# Patient Record
Sex: Female | Born: 1935 | Race: White | Hispanic: No | Marital: Married | Smoking: Former smoker
Health system: Southern US, Community
[De-identification: ages and names within clinical notes are randomized; demographics above are authoritative.]

## PROBLEM LIST (undated history)

## (undated) DIAGNOSIS — I10 Essential (primary) hypertension: Secondary | ICD-10-CM

## (undated) DIAGNOSIS — R2689 Other abnormalities of gait and mobility: Secondary | ICD-10-CM

## (undated) DIAGNOSIS — R0600 Dyspnea, unspecified: Secondary | ICD-10-CM

## (undated) DIAGNOSIS — K649 Unspecified hemorrhoids: Secondary | ICD-10-CM

## (undated) DIAGNOSIS — IMO0001 Reserved for inherently not codable concepts without codable children: Secondary | ICD-10-CM

## (undated) DIAGNOSIS — Z87442 Personal history of urinary calculi: Secondary | ICD-10-CM

## (undated) DIAGNOSIS — M1990 Unspecified osteoarthritis, unspecified site: Secondary | ICD-10-CM

## (undated) DIAGNOSIS — R112 Nausea with vomiting, unspecified: Secondary | ICD-10-CM

## (undated) DIAGNOSIS — C801 Malignant (primary) neoplasm, unspecified: Secondary | ICD-10-CM

## (undated) DIAGNOSIS — I509 Heart failure, unspecified: Secondary | ICD-10-CM

## (undated) DIAGNOSIS — K219 Gastro-esophageal reflux disease without esophagitis: Secondary | ICD-10-CM

## (undated) DIAGNOSIS — R0609 Other forms of dyspnea: Secondary | ICD-10-CM

## (undated) DIAGNOSIS — M810 Age-related osteoporosis without current pathological fracture: Secondary | ICD-10-CM

## (undated) DIAGNOSIS — D649 Anemia, unspecified: Secondary | ICD-10-CM

## (undated) DIAGNOSIS — Z5181 Encounter for therapeutic drug level monitoring: Secondary | ICD-10-CM

## (undated) DIAGNOSIS — Z973 Presence of spectacles and contact lenses: Secondary | ICD-10-CM

## (undated) DIAGNOSIS — Z85118 Personal history of other malignant neoplasm of bronchus and lung: Secondary | ICD-10-CM

## (undated) DIAGNOSIS — Z9889 Other specified postprocedural states: Secondary | ICD-10-CM

## (undated) DIAGNOSIS — Z86711 Personal history of pulmonary embolism: Secondary | ICD-10-CM

## (undated) DIAGNOSIS — R21 Rash and other nonspecific skin eruption: Secondary | ICD-10-CM

## (undated) DIAGNOSIS — Z8719 Personal history of other diseases of the digestive system: Secondary | ICD-10-CM

## (undated) DIAGNOSIS — J449 Chronic obstructive pulmonary disease, unspecified: Secondary | ICD-10-CM

## (undated) DIAGNOSIS — M25531 Pain in right wrist: Secondary | ICD-10-CM

## (undated) DIAGNOSIS — F329 Major depressive disorder, single episode, unspecified: Secondary | ICD-10-CM

## (undated) HISTORY — DX: Major depressive disorder, single episode, unspecified: F32.9

## (undated) HISTORY — DX: Other abnormalities of gait and mobility: R26.89

## (undated) HISTORY — DX: Encounter for therapeutic drug level monitoring: Z51.81

## (undated) HISTORY — DX: Malignant (primary) neoplasm, unspecified: C80.1

## (undated) HISTORY — PX: TONSILLECTOMY: SUR1361

## (undated) HISTORY — DX: Unspecified hemorrhoids: K64.9

## (undated) HISTORY — PX: THUMB ARTHROSCOPY: SHX2509

## (undated) HISTORY — DX: Essential (primary) hypertension: I10

## (undated) HISTORY — PX: EYE SURGERY: SHX253

---

## 1980-12-24 HISTORY — PX: TOTAL ABDOMINAL HYSTERECTOMY W/ BILATERAL SALPINGOOPHORECTOMY: SHX83

## 1997-12-24 HISTORY — PX: KNEE ARTHROSCOPY: SUR90

## 1998-05-24 ENCOUNTER — Inpatient Hospital Stay (HOSPITAL_COMMUNITY): Admission: EM | Admit: 1998-05-24 | Discharge: 1998-05-27 | Payer: Self-pay | Admitting: Internal Medicine

## 1999-01-03 ENCOUNTER — Other Ambulatory Visit: Admission: RE | Admit: 1999-01-03 | Discharge: 1999-01-03 | Payer: Self-pay | Admitting: Obstetrics & Gynecology

## 1999-02-22 ENCOUNTER — Encounter: Payer: Self-pay | Admitting: Obstetrics & Gynecology

## 1999-02-22 ENCOUNTER — Ambulatory Visit (HOSPITAL_COMMUNITY): Admission: RE | Admit: 1999-02-22 | Discharge: 1999-02-22 | Payer: Self-pay | Admitting: Obstetrics & Gynecology

## 1999-02-22 HISTORY — PX: OTHER SURGICAL HISTORY: SHX169

## 1999-02-27 ENCOUNTER — Ambulatory Visit (HOSPITAL_BASED_OUTPATIENT_CLINIC_OR_DEPARTMENT_OTHER): Admission: RE | Admit: 1999-02-27 | Discharge: 1999-02-27 | Payer: Self-pay | Admitting: General Surgery

## 1999-03-07 ENCOUNTER — Ambulatory Visit (HOSPITAL_COMMUNITY): Admission: RE | Admit: 1999-03-07 | Discharge: 1999-03-07 | Payer: Self-pay | Admitting: Orthopedic Surgery

## 2000-02-05 ENCOUNTER — Encounter: Admission: RE | Admit: 2000-02-05 | Discharge: 2000-02-05 | Payer: Self-pay | Admitting: Internal Medicine

## 2000-02-05 ENCOUNTER — Encounter: Payer: Self-pay | Admitting: Internal Medicine

## 2001-02-19 ENCOUNTER — Encounter: Payer: Self-pay | Admitting: Internal Medicine

## 2001-02-19 ENCOUNTER — Encounter: Admission: RE | Admit: 2001-02-19 | Discharge: 2001-02-19 | Payer: Self-pay | Admitting: Internal Medicine

## 2002-02-26 ENCOUNTER — Encounter: Admission: RE | Admit: 2002-02-26 | Discharge: 2002-02-26 | Payer: Self-pay | Admitting: Internal Medicine

## 2002-02-26 ENCOUNTER — Encounter: Payer: Self-pay | Admitting: Internal Medicine

## 2002-12-24 HISTORY — PX: BREAST EXCISIONAL BIOPSY: SUR124

## 2003-03-01 ENCOUNTER — Encounter: Payer: Self-pay | Admitting: Internal Medicine

## 2003-03-01 ENCOUNTER — Encounter: Admission: RE | Admit: 2003-03-01 | Discharge: 2003-03-01 | Payer: Self-pay | Admitting: Internal Medicine

## 2004-03-09 ENCOUNTER — Encounter: Admission: RE | Admit: 2004-03-09 | Discharge: 2004-03-09 | Payer: Self-pay | Admitting: Internal Medicine

## 2005-03-15 ENCOUNTER — Encounter: Admission: RE | Admit: 2005-03-15 | Discharge: 2005-03-15 | Payer: Self-pay | Admitting: Internal Medicine

## 2006-04-16 ENCOUNTER — Encounter: Admission: RE | Admit: 2006-04-16 | Discharge: 2006-04-16 | Payer: Self-pay | Admitting: Internal Medicine

## 2007-04-21 ENCOUNTER — Encounter: Admission: RE | Admit: 2007-04-21 | Discharge: 2007-04-21 | Payer: Self-pay | Admitting: Family Medicine

## 2007-11-25 ENCOUNTER — Emergency Department (HOSPITAL_COMMUNITY): Admission: EM | Admit: 2007-11-25 | Discharge: 2007-11-25 | Payer: Self-pay | Admitting: Emergency Medicine

## 2008-04-22 ENCOUNTER — Encounter: Admission: RE | Admit: 2008-04-22 | Discharge: 2008-04-22 | Payer: Self-pay | Admitting: Family Medicine

## 2008-12-22 ENCOUNTER — Inpatient Hospital Stay (HOSPITAL_COMMUNITY): Admission: AD | Admit: 2008-12-22 | Discharge: 2008-12-23 | Payer: Self-pay | Admitting: Internal Medicine

## 2008-12-23 ENCOUNTER — Encounter (INDEPENDENT_AMBULATORY_CARE_PROVIDER_SITE_OTHER): Payer: Self-pay | Admitting: Internal Medicine

## 2008-12-23 HISTORY — PX: TRANSTHORACIC ECHOCARDIOGRAM: SHX275

## 2009-01-07 ENCOUNTER — Encounter: Admission: RE | Admit: 2009-01-07 | Discharge: 2009-01-07 | Payer: Self-pay | Admitting: Family Medicine

## 2009-01-13 ENCOUNTER — Ambulatory Visit (HOSPITAL_COMMUNITY): Admission: RE | Admit: 2009-01-13 | Discharge: 2009-01-13 | Payer: Self-pay | Admitting: Family Medicine

## 2009-01-20 ENCOUNTER — Ambulatory Visit: Payer: Self-pay | Admitting: Thoracic Surgery

## 2009-01-20 ENCOUNTER — Encounter: Admission: RE | Admit: 2009-01-20 | Discharge: 2009-01-20 | Payer: Self-pay | Admitting: Thoracic Surgery

## 2009-01-25 ENCOUNTER — Encounter: Admission: RE | Admit: 2009-01-25 | Discharge: 2009-01-25 | Payer: Self-pay | Admitting: Thoracic Surgery

## 2009-01-26 ENCOUNTER — Ambulatory Visit: Payer: Self-pay | Admitting: Internal Medicine

## 2009-01-26 ENCOUNTER — Encounter: Payer: Self-pay | Admitting: Thoracic Surgery

## 2009-01-26 ENCOUNTER — Ambulatory Visit (HOSPITAL_COMMUNITY): Admission: RE | Admit: 2009-01-26 | Discharge: 2009-01-26 | Payer: Self-pay | Admitting: Thoracic Surgery

## 2009-01-26 ENCOUNTER — Ambulatory Visit: Payer: Self-pay | Admitting: Thoracic Surgery

## 2009-01-26 HISTORY — PX: THORACOSCOPY: SUR1347

## 2009-01-27 ENCOUNTER — Ambulatory Visit: Payer: Self-pay | Admitting: Thoracic Surgery

## 2009-01-27 ENCOUNTER — Ambulatory Visit: Payer: Self-pay | Admitting: Internal Medicine

## 2009-01-31 ENCOUNTER — Encounter: Admission: RE | Admit: 2009-01-31 | Discharge: 2009-01-31 | Payer: Self-pay | Admitting: Thoracic Surgery

## 2009-01-31 HISTORY — PX: BREAST BIOPSY: SHX20

## 2009-02-01 ENCOUNTER — Ambulatory Visit: Admission: RE | Admit: 2009-02-01 | Discharge: 2009-03-30 | Payer: Self-pay | Admitting: Radiation Oncology

## 2009-02-07 LAB — COMPREHENSIVE METABOLIC PANEL
BUN: 14 mg/dL (ref 6–23)
CO2: 22 mEq/L (ref 19–32)
Calcium: 8.9 mg/dL (ref 8.4–10.5)
Chloride: 107 mEq/L (ref 96–112)
Creatinine, Ser: 0.62 mg/dL (ref 0.40–1.20)
Glucose, Bld: 178 mg/dL — ABNORMAL HIGH (ref 70–99)
Total Bilirubin: 0.4 mg/dL (ref 0.3–1.2)

## 2009-02-07 LAB — CBC WITH DIFFERENTIAL/PLATELET
Basophils Absolute: 0 10*3/uL (ref 0.0–0.1)
Eosinophils Absolute: 0.2 10*3/uL (ref 0.0–0.5)
HCT: 37.1 % (ref 34.8–46.6)
MCH: 30.1 pg (ref 26.0–34.0)
MCHC: 34.5 g/dL (ref 32.0–36.0)
MCV: 87.4 fL (ref 81.0–101.0)
MONO#: 0.4 10*3/uL (ref 0.1–0.9)
Platelets: 339 10*3/uL (ref 145–400)
RBC: 4.24 10*6/uL (ref 3.70–5.32)
lymph#: 1.1 10*3/uL (ref 0.9–3.3)

## 2009-02-14 LAB — COMPREHENSIVE METABOLIC PANEL
CO2: 21 mEq/L (ref 19–32)
Calcium: 9 mg/dL (ref 8.4–10.5)
Chloride: 107 mEq/L (ref 96–112)
Creatinine, Ser: 0.68 mg/dL (ref 0.40–1.20)
Glucose, Bld: 86 mg/dL (ref 70–99)
Sodium: 139 mEq/L (ref 135–145)
Total Bilirubin: 0.4 mg/dL (ref 0.3–1.2)
Total Protein: 6.2 g/dL (ref 6.0–8.3)

## 2009-02-14 LAB — CBC WITH DIFFERENTIAL/PLATELET
Basophils Absolute: 0 10*3/uL (ref 0.0–0.1)
Eosinophils Absolute: 0.2 10*3/uL (ref 0.0–0.5)
HGB: 12.4 g/dL (ref 11.6–15.9)
LYMPH%: 15.7 % (ref 14.0–49.7)
MCV: 85 fL (ref 79.5–101.0)
MONO#: 0.4 10*3/uL (ref 0.1–0.9)
MONO%: 8.4 % (ref 0.0–14.0)
NEUT#: 3.4 10*3/uL (ref 1.5–6.5)
Platelets: 282 10*3/uL (ref 145–400)

## 2009-02-16 ENCOUNTER — Ambulatory Visit: Payer: Self-pay | Admitting: Thoracic Surgery

## 2009-02-21 LAB — COMPREHENSIVE METABOLIC PANEL
ALT: 15 U/L (ref 0–35)
Alkaline Phosphatase: 58 U/L (ref 39–117)
CO2: 19 mEq/L (ref 19–32)
Creatinine, Ser: 0.61 mg/dL (ref 0.40–1.20)
Total Bilirubin: 0.5 mg/dL (ref 0.3–1.2)

## 2009-02-21 LAB — CBC WITH DIFFERENTIAL/PLATELET
BASO%: 0.3 % (ref 0.0–2.0)
EOS%: 3.8 % (ref 0.0–7.0)
Eosinophils Absolute: 0.2 10*3/uL (ref 0.0–0.5)
LYMPH%: 13.6 % — ABNORMAL LOW (ref 14.0–49.7)
MCH: 28.9 pg (ref 25.1–34.0)
MCHC: 34.1 g/dL (ref 31.5–36.0)
MCV: 84.7 fL (ref 79.5–101.0)
MONO%: 7.6 % (ref 0.0–14.0)
Platelets: 196 10*3/uL (ref 145–400)
RBC: 4.19 10*6/uL (ref 3.70–5.45)
nRBC: 0 % (ref 0–0)

## 2009-02-28 LAB — COMPREHENSIVE METABOLIC PANEL
Albumin: 3.4 g/dL — ABNORMAL LOW (ref 3.5–5.2)
CO2: 18 mEq/L — ABNORMAL LOW (ref 19–32)
Glucose, Bld: 182 mg/dL — ABNORMAL HIGH (ref 70–99)
Potassium: 3.7 mEq/L (ref 3.5–5.3)
Sodium: 138 mEq/L (ref 135–145)
Total Bilirubin: 0.4 mg/dL (ref 0.3–1.2)
Total Protein: 5 g/dL — ABNORMAL LOW (ref 6.0–8.3)

## 2009-02-28 LAB — CBC WITH DIFFERENTIAL/PLATELET
Eosinophils Absolute: 0.1 10*3/uL (ref 0.0–0.5)
MONO#: 0.4 10*3/uL (ref 0.1–0.9)
NEUT#: 2.4 10*3/uL (ref 1.5–6.5)
RBC: 4.11 10*6/uL (ref 3.70–5.45)
RDW: 14.1 % (ref 11.2–14.5)
WBC: 3.2 10*3/uL — ABNORMAL LOW (ref 3.9–10.3)
nRBC: 0 % (ref 0–0)

## 2009-03-07 LAB — CBC WITH DIFFERENTIAL/PLATELET
BASO%: 0.3 % (ref 0.0–2.0)
Basophils Absolute: 0 10*3/uL (ref 0.0–0.1)
HCT: 33.5 % — ABNORMAL LOW (ref 34.8–46.6)
HGB: 11.5 g/dL — ABNORMAL LOW (ref 11.6–15.9)
MONO#: 0.3 10*3/uL (ref 0.1–0.9)
NEUT%: 82.6 % — ABNORMAL HIGH (ref 38.4–76.8)
WBC: 3.8 10*3/uL — ABNORMAL LOW (ref 3.9–10.3)
lymph#: 0.3 10*3/uL — ABNORMAL LOW (ref 0.9–3.3)

## 2009-03-07 LAB — COMPREHENSIVE METABOLIC PANEL
AST: 14 U/L (ref 0–37)
Albumin: 4 g/dL (ref 3.5–5.2)
BUN: 18 mg/dL (ref 6–23)
Calcium: 8.9 mg/dL (ref 8.4–10.5)
Chloride: 108 mEq/L (ref 96–112)
Glucose, Bld: 89 mg/dL (ref 70–99)
Potassium: 4.2 mEq/L (ref 3.5–5.3)
Total Protein: 6.1 g/dL (ref 6.0–8.3)

## 2009-03-14 ENCOUNTER — Ambulatory Visit: Payer: Self-pay | Admitting: Internal Medicine

## 2009-03-14 LAB — CBC WITH DIFFERENTIAL/PLATELET
Eosinophils Absolute: 0.1 10*3/uL (ref 0.0–0.5)
MONO#: 0.3 10*3/uL (ref 0.1–0.9)
NEUT#: 2.4 10*3/uL (ref 1.5–6.5)
RBC: 3.76 10*6/uL (ref 3.70–5.45)
RDW: 14.9 % — ABNORMAL HIGH (ref 11.2–14.5)
WBC: 3 10*3/uL — ABNORMAL LOW (ref 3.9–10.3)
nRBC: 0 % (ref 0–0)

## 2009-03-14 LAB — COMPREHENSIVE METABOLIC PANEL
ALT: 13 U/L (ref 0–35)
AST: 16 U/L (ref 0–37)
CO2: 22 mEq/L (ref 19–32)
Calcium: 9 mg/dL (ref 8.4–10.5)
Chloride: 108 mEq/L (ref 96–112)
Sodium: 139 mEq/L (ref 135–145)
Total Bilirubin: 0.5 mg/dL (ref 0.3–1.2)
Total Protein: 6 g/dL (ref 6.0–8.3)

## 2009-03-21 LAB — COMPREHENSIVE METABOLIC PANEL
Alkaline Phosphatase: 57 U/L (ref 39–117)
Creatinine, Ser: 0.64 mg/dL (ref 0.40–1.20)
Glucose, Bld: 93 mg/dL (ref 70–99)
Sodium: 140 mEq/L (ref 135–145)
Total Bilirubin: 0.5 mg/dL (ref 0.3–1.2)
Total Protein: 6.3 g/dL (ref 6.0–8.3)

## 2009-03-21 LAB — CBC WITH DIFFERENTIAL/PLATELET
Basophils Absolute: 0 10*3/uL (ref 0.0–0.1)
Eosinophils Absolute: 0 10*3/uL (ref 0.0–0.5)
HCT: 32.9 % — ABNORMAL LOW (ref 34.8–46.6)
LYMPH%: 6.9 % — ABNORMAL LOW (ref 14.0–49.7)
MCV: 85.5 fL (ref 79.5–101.0)
MONO%: 13.8 % (ref 0.0–14.0)
NEUT#: 1.9 10*3/uL (ref 1.5–6.5)
NEUT%: 77.7 % — ABNORMAL HIGH (ref 38.4–76.8)
Platelets: 183 10*3/uL (ref 145–400)
RBC: 3.85 10*6/uL (ref 3.70–5.45)

## 2009-04-07 ENCOUNTER — Encounter: Admission: RE | Admit: 2009-04-07 | Discharge: 2009-06-15 | Payer: Self-pay | Admitting: Family Medicine

## 2009-04-18 ENCOUNTER — Ambulatory Visit (HOSPITAL_COMMUNITY): Admission: RE | Admit: 2009-04-18 | Discharge: 2009-04-18 | Payer: Self-pay | Admitting: Internal Medicine

## 2009-04-21 LAB — CBC WITH DIFFERENTIAL/PLATELET
Basophils Absolute: 0 10*3/uL (ref 0.0–0.1)
Eosinophils Absolute: 0.1 10*3/uL (ref 0.0–0.5)
HGB: 11.8 g/dL (ref 11.6–15.9)
MCV: 93.5 fL (ref 79.5–101.0)
MONO#: 0.5 10*3/uL (ref 0.1–0.9)
MONO%: 12.6 % (ref 0.0–14.0)
NEUT#: 2.8 10*3/uL (ref 1.5–6.5)
Platelets: 236 10*3/uL (ref 145–400)
RBC: 3.64 10*6/uL — ABNORMAL LOW (ref 3.70–5.45)
RDW: 21 % — ABNORMAL HIGH (ref 11.2–14.5)
WBC: 3.8 10*3/uL — ABNORMAL LOW (ref 3.9–10.3)

## 2009-04-21 LAB — COMPREHENSIVE METABOLIC PANEL
Albumin: 4 g/dL (ref 3.5–5.2)
BUN: 17 mg/dL (ref 6–23)
CO2: 24 mEq/L (ref 19–32)
Calcium: 9.1 mg/dL (ref 8.4–10.5)
Glucose, Bld: 87 mg/dL (ref 70–99)
Potassium: 4.4 mEq/L (ref 3.5–5.3)
Sodium: 141 mEq/L (ref 135–145)
Total Protein: 6 g/dL (ref 6.0–8.3)

## 2009-04-25 ENCOUNTER — Encounter: Admission: RE | Admit: 2009-04-25 | Discharge: 2009-04-25 | Payer: Self-pay | Admitting: Family Medicine

## 2009-05-03 ENCOUNTER — Ambulatory Visit: Payer: Self-pay | Admitting: Internal Medicine

## 2009-05-12 LAB — COMPREHENSIVE METABOLIC PANEL
Alkaline Phosphatase: 64 U/L (ref 39–117)
BUN: 17 mg/dL (ref 6–23)
CO2: 23 mEq/L (ref 19–32)
Glucose, Bld: 115 mg/dL — ABNORMAL HIGH (ref 70–99)
Total Bilirubin: 0.8 mg/dL (ref 0.3–1.2)
Total Protein: 5.9 g/dL — ABNORMAL LOW (ref 6.0–8.3)

## 2009-05-12 LAB — CBC WITH DIFFERENTIAL/PLATELET
Basophils Absolute: 0 10*3/uL (ref 0.0–0.1)
Eosinophils Absolute: 0.2 10*3/uL (ref 0.0–0.5)
HCT: 34.6 % — ABNORMAL LOW (ref 34.8–46.6)
HGB: 12 g/dL (ref 11.6–15.9)
LYMPH%: 10.5 % — ABNORMAL LOW (ref 14.0–49.7)
MCV: 95.6 fL (ref 79.5–101.0)
MONO#: 0.5 10*3/uL (ref 0.1–0.9)
MONO%: 9.5 % (ref 0.0–14.0)
NEUT#: 3.9 10*3/uL (ref 1.5–6.5)
NEUT%: 74.9 % (ref 38.4–76.8)
Platelets: 244 10*3/uL (ref 145–400)
WBC: 5.2 10*3/uL (ref 3.9–10.3)

## 2009-06-02 LAB — CBC WITH DIFFERENTIAL/PLATELET
Eosinophils Absolute: 0.4 10*3/uL (ref 0.0–0.5)
HCT: 38.1 % (ref 34.8–46.6)
LYMPH%: 12.1 % — ABNORMAL LOW (ref 14.0–49.7)
MCV: 95.7 fL (ref 79.5–101.0)
MONO%: 8.8 % (ref 0.0–14.0)
NEUT#: 3.9 10*3/uL (ref 1.5–6.5)
NEUT%: 72.2 % (ref 38.4–76.8)
Platelets: 245 10*3/uL (ref 145–400)
RBC: 3.98 10*6/uL (ref 3.70–5.45)

## 2009-06-02 LAB — COMPREHENSIVE METABOLIC PANEL
Alkaline Phosphatase: 65 U/L (ref 39–117)
BUN: 17 mg/dL (ref 6–23)
Creatinine, Ser: 0.75 mg/dL (ref 0.40–1.20)
Glucose, Bld: 88 mg/dL (ref 70–99)
Sodium: 141 mEq/L (ref 135–145)
Total Bilirubin: 0.9 mg/dL (ref 0.3–1.2)
Total Protein: 6.4 g/dL (ref 6.0–8.3)

## 2009-07-07 ENCOUNTER — Ambulatory Visit (HOSPITAL_COMMUNITY): Admission: RE | Admit: 2009-07-07 | Discharge: 2009-07-07 | Payer: Self-pay | Admitting: Internal Medicine

## 2009-07-07 ENCOUNTER — Ambulatory Visit: Payer: Self-pay | Admitting: Internal Medicine

## 2009-07-11 LAB — CBC WITH DIFFERENTIAL/PLATELET
BASO%: 0.3 % (ref 0.0–2.0)
LYMPH%: 10 % — ABNORMAL LOW (ref 14.0–49.7)
MCH: 31.8 pg (ref 25.1–34.0)
MCHC: 33.9 g/dL (ref 31.5–36.0)
MCV: 93.8 fL (ref 79.5–101.0)
MONO%: 10.5 % (ref 0.0–14.0)
Platelets: 309 10*3/uL (ref 145–400)
RBC: 4.15 10*6/uL (ref 3.70–5.45)

## 2009-07-11 LAB — COMPREHENSIVE METABOLIC PANEL
ALT: 13 U/L (ref 0–35)
Alkaline Phosphatase: 69 U/L (ref 39–117)
Creatinine, Ser: 0.8 mg/dL (ref 0.40–1.20)
Sodium: 138 mEq/L (ref 135–145)
Total Bilirubin: 0.6 mg/dL (ref 0.3–1.2)
Total Protein: 6.2 g/dL (ref 6.0–8.3)

## 2009-08-05 ENCOUNTER — Ambulatory Visit: Payer: Self-pay | Admitting: Internal Medicine

## 2009-08-09 LAB — CBC WITH DIFFERENTIAL/PLATELET
BASO%: 0.1 % (ref 0.0–2.0)
LYMPH%: 8 % — ABNORMAL LOW (ref 14.0–49.7)
MCHC: 34.6 g/dL (ref 31.5–36.0)
MONO#: 0.5 10*3/uL (ref 0.1–0.9)
MONO%: 6.7 % (ref 0.0–14.0)
Platelets: 340 10*3/uL (ref 145–400)
RBC: 3.92 10*6/uL (ref 3.70–5.45)
WBC: 7.1 10*3/uL (ref 3.9–10.3)

## 2009-08-09 LAB — COMPREHENSIVE METABOLIC PANEL
ALT: 15 U/L (ref 0–35)
AST: 17 U/L (ref 0–37)
Alkaline Phosphatase: 66 U/L (ref 39–117)
CO2: 22 mEq/L (ref 19–32)
Sodium: 141 mEq/L (ref 135–145)
Total Bilirubin: 0.7 mg/dL (ref 0.3–1.2)
Total Protein: 6.2 g/dL (ref 6.0–8.3)

## 2009-09-06 ENCOUNTER — Ambulatory Visit: Payer: Self-pay | Admitting: Internal Medicine

## 2009-09-08 LAB — COMPREHENSIVE METABOLIC PANEL
ALT: 16 U/L (ref 0–35)
CO2: 23 mEq/L (ref 19–32)
Chloride: 109 mEq/L (ref 96–112)
Potassium: 3.9 mEq/L (ref 3.5–5.3)
Sodium: 142 mEq/L (ref 135–145)
Total Bilirubin: 1 mg/dL (ref 0.3–1.2)
Total Protein: 5.9 g/dL — ABNORMAL LOW (ref 6.0–8.3)

## 2009-09-08 LAB — CBC WITH DIFFERENTIAL/PLATELET
BASO%: 0.2 % (ref 0.0–2.0)
LYMPH%: 8.4 % — ABNORMAL LOW (ref 14.0–49.7)
MCHC: 34.7 g/dL (ref 31.5–36.0)
MONO#: 0.4 10*3/uL (ref 0.1–0.9)
RBC: 3.95 10*6/uL (ref 3.70–5.45)
RDW: 15.4 % — ABNORMAL HIGH (ref 11.2–14.5)
WBC: 5.1 10*3/uL (ref 3.9–10.3)
lymph#: 0.4 10*3/uL — ABNORMAL LOW (ref 0.9–3.3)

## 2009-10-03 ENCOUNTER — Ambulatory Visit (HOSPITAL_COMMUNITY): Admission: RE | Admit: 2009-10-03 | Discharge: 2009-10-03 | Payer: Self-pay | Admitting: Internal Medicine

## 2009-10-06 ENCOUNTER — Ambulatory Visit: Payer: Self-pay | Admitting: Internal Medicine

## 2009-10-06 LAB — COMPREHENSIVE METABOLIC PANEL
ALT: 19 U/L (ref 0–35)
CO2: 24 mEq/L (ref 19–32)
Calcium: 9.2 mg/dL (ref 8.4–10.5)
Chloride: 108 mEq/L (ref 96–112)
Glucose, Bld: 87 mg/dL (ref 70–99)
Sodium: 143 mEq/L (ref 135–145)
Total Protein: 5.9 g/dL — ABNORMAL LOW (ref 6.0–8.3)

## 2009-10-06 LAB — CBC WITH DIFFERENTIAL/PLATELET
BASO%: 0.3 % (ref 0.0–2.0)
Eosinophils Absolute: 0.4 10*3/uL (ref 0.0–0.5)
HCT: 37.5 % (ref 34.8–46.6)
MCHC: 34.1 g/dL (ref 31.5–36.0)
MONO#: 0.4 10*3/uL (ref 0.1–0.9)
NEUT#: 3 10*3/uL (ref 1.5–6.5)
NEUT%: 70.7 % (ref 38.4–76.8)
RBC: 4.05 10*6/uL (ref 3.70–5.45)
WBC: 4.2 10*3/uL (ref 3.9–10.3)
lymph#: 0.4 10*3/uL — ABNORMAL LOW (ref 0.9–3.3)

## 2009-11-03 LAB — COMPREHENSIVE METABOLIC PANEL
ALT: 16 U/L (ref 0–35)
CO2: 26 mEq/L (ref 19–32)
Calcium: 9.3 mg/dL (ref 8.4–10.5)
Chloride: 107 mEq/L (ref 96–112)
Creatinine, Ser: 0.7 mg/dL (ref 0.40–1.20)
Glucose, Bld: 81 mg/dL (ref 70–99)
Total Bilirubin: 0.7 mg/dL (ref 0.3–1.2)

## 2009-11-03 LAB — CBC WITH DIFFERENTIAL/PLATELET
BASO%: 0.4 % (ref 0.0–2.0)
Basophils Absolute: 0 10*3/uL (ref 0.0–0.1)
Eosinophils Absolute: 0.3 10*3/uL (ref 0.0–0.5)
HCT: 37.3 % (ref 34.8–46.6)
HGB: 12.5 g/dL (ref 11.6–15.9)
LYMPH%: 12.3 % — ABNORMAL LOW (ref 14.0–49.7)
MCHC: 33.6 g/dL (ref 31.5–36.0)
MONO#: 0.5 10*3/uL (ref 0.1–0.9)
NEUT#: 3 10*3/uL (ref 1.5–6.5)
NEUT%: 67.9 % (ref 38.4–76.8)
Platelets: 261 10*3/uL (ref 145–400)
WBC: 4.4 10*3/uL (ref 3.9–10.3)
lymph#: 0.5 10*3/uL — ABNORMAL LOW (ref 0.9–3.3)

## 2009-11-29 ENCOUNTER — Ambulatory Visit: Payer: Self-pay | Admitting: Internal Medicine

## 2009-12-01 LAB — COMPREHENSIVE METABOLIC PANEL
ALT: 18 U/L (ref 0–35)
BUN: 20 mg/dL (ref 6–23)
CO2: 27 mEq/L (ref 19–32)
Calcium: 9.1 mg/dL (ref 8.4–10.5)
Chloride: 107 mEq/L (ref 96–112)
Creatinine, Ser: 0.71 mg/dL (ref 0.40–1.20)
Glucose, Bld: 73 mg/dL (ref 70–99)
Total Bilirubin: 1 mg/dL (ref 0.3–1.2)

## 2009-12-01 LAB — CBC WITH DIFFERENTIAL/PLATELET
BASO%: 0.3 % (ref 0.0–2.0)
Basophils Absolute: 0 10*3/uL (ref 0.0–0.1)
HCT: 37.5 % (ref 34.8–46.6)
HGB: 12.8 g/dL (ref 11.6–15.9)
LYMPH%: 12.7 % — ABNORMAL LOW (ref 14.0–49.7)
MCH: 32.1 pg (ref 25.1–34.0)
MCHC: 34.2 g/dL (ref 31.5–36.0)
MONO#: 0.5 10*3/uL (ref 0.1–0.9)
NEUT%: 70.3 % (ref 38.4–76.8)
Platelets: 275 10*3/uL (ref 145–400)
WBC: 4.4 10*3/uL (ref 3.9–10.3)
lymph#: 0.6 10*3/uL — ABNORMAL LOW (ref 0.9–3.3)

## 2010-01-02 ENCOUNTER — Ambulatory Visit (HOSPITAL_COMMUNITY): Admission: RE | Admit: 2010-01-02 | Discharge: 2010-01-02 | Payer: Self-pay | Admitting: Internal Medicine

## 2010-01-03 ENCOUNTER — Ambulatory Visit: Payer: Self-pay | Admitting: Internal Medicine

## 2010-01-05 LAB — COMPREHENSIVE METABOLIC PANEL
Albumin: 3.9 g/dL (ref 3.5–5.2)
BUN: 23 mg/dL (ref 6–23)
CO2: 25 mEq/L (ref 19–32)
Calcium: 9 mg/dL (ref 8.4–10.5)
Chloride: 107 mEq/L (ref 96–112)
Creatinine, Ser: 0.73 mg/dL (ref 0.40–1.20)
Glucose, Bld: 85 mg/dL (ref 70–99)
Potassium: 3.9 mEq/L (ref 3.5–5.3)

## 2010-01-05 LAB — CBC WITH DIFFERENTIAL/PLATELET
Basophils Absolute: 0 10*3/uL (ref 0.0–0.1)
Eosinophils Absolute: 0.3 10*3/uL (ref 0.0–0.5)
HCT: 37.2 % (ref 34.8–46.6)
HGB: 12.6 g/dL (ref 11.6–15.9)
MCH: 32.1 pg (ref 25.1–34.0)
MONO#: 0.6 10*3/uL (ref 0.1–0.9)
NEUT#: 4.4 10*3/uL (ref 1.5–6.5)
NEUT%: 74.9 % (ref 38.4–76.8)
RDW: 15.1 % — ABNORMAL HIGH (ref 11.2–14.5)
WBC: 5.8 10*3/uL (ref 3.9–10.3)
lymph#: 0.6 10*3/uL — ABNORMAL LOW (ref 0.9–3.3)

## 2010-02-02 ENCOUNTER — Ambulatory Visit: Payer: Self-pay | Admitting: Internal Medicine

## 2010-02-02 LAB — CBC WITH DIFFERENTIAL/PLATELET
BASO%: 0.3 % (ref 0.0–2.0)
LYMPH%: 8.8 % — ABNORMAL LOW (ref 14.0–49.7)
MCH: 32 pg (ref 25.1–34.0)
MCHC: 33.8 g/dL (ref 31.5–36.0)
MCV: 94.7 fL (ref 79.5–101.0)
MONO%: 9.2 % (ref 0.0–14.0)
NEUT%: 77.5 % — ABNORMAL HIGH (ref 38.4–76.8)
Platelets: 251 10*3/uL (ref 145–400)
RBC: 4.05 10*6/uL (ref 3.70–5.45)

## 2010-02-02 LAB — COMPREHENSIVE METABOLIC PANEL WITH GFR
ALT: 17 U/L (ref 0–35)
AST: 19 U/L (ref 0–37)
Albumin: 4 g/dL (ref 3.5–5.2)
Alkaline Phosphatase: 61 U/L (ref 39–117)
BUN: 20 mg/dL (ref 6–23)
CO2: 25 meq/L (ref 19–32)
Calcium: 9.3 mg/dL (ref 8.4–10.5)
Chloride: 105 meq/L (ref 96–112)
Creatinine, Ser: 0.72 mg/dL (ref 0.40–1.20)
Glucose, Bld: 86 mg/dL (ref 70–99)
Potassium: 4.1 meq/L (ref 3.5–5.3)
Sodium: 143 meq/L (ref 135–145)
Total Bilirubin: 0.7 mg/dL (ref 0.3–1.2)
Total Protein: 6 g/dL (ref 6.0–8.3)

## 2010-03-02 LAB — CBC WITH DIFFERENTIAL/PLATELET
BASO%: 0.1 % (ref 0.0–2.0)
EOS%: 4.8 % (ref 0.0–7.0)
LYMPH%: 12 % — ABNORMAL LOW (ref 14.0–49.7)
MCHC: 33.8 g/dL (ref 31.5–36.0)
MONO#: 0.5 10*3/uL (ref 0.1–0.9)
MONO%: 8.4 % (ref 0.0–14.0)
Platelets: 272 10*3/uL (ref 145–400)
RBC: 4.02 10*6/uL (ref 3.70–5.45)
WBC: 5.9 10*3/uL (ref 3.9–10.3)

## 2010-03-02 LAB — COMPREHENSIVE METABOLIC PANEL
ALT: 18 U/L (ref 0–35)
AST: 21 U/L (ref 0–37)
Alkaline Phosphatase: 66 U/L (ref 39–117)
CO2: 26 mEq/L (ref 19–32)
Sodium: 141 mEq/L (ref 135–145)
Total Bilirubin: 0.6 mg/dL (ref 0.3–1.2)
Total Protein: 6.3 g/dL (ref 6.0–8.3)

## 2010-04-03 ENCOUNTER — Ambulatory Visit (HOSPITAL_COMMUNITY): Admission: RE | Admit: 2010-04-03 | Discharge: 2010-04-03 | Payer: Self-pay | Admitting: Internal Medicine

## 2010-04-04 ENCOUNTER — Ambulatory Visit: Payer: Self-pay | Admitting: Internal Medicine

## 2010-04-06 LAB — COMPREHENSIVE METABOLIC PANEL
ALT: 24 U/L (ref 0–35)
CO2: 22 mEq/L (ref 19–32)
Chloride: 106 mEq/L (ref 96–112)
Sodium: 142 mEq/L (ref 135–145)
Total Bilirubin: 0.8 mg/dL (ref 0.3–1.2)
Total Protein: 6.1 g/dL (ref 6.0–8.3)

## 2010-04-06 LAB — CBC WITH DIFFERENTIAL/PLATELET
BASO%: 0.3 % (ref 0.0–2.0)
MCHC: 34.1 g/dL (ref 31.5–36.0)
MONO#: 0.4 10*3/uL (ref 0.1–0.9)
RBC: 4.04 10*6/uL (ref 3.70–5.45)
RDW: 14.4 % (ref 11.2–14.5)
WBC: 5 10*3/uL (ref 3.9–10.3)
lymph#: 0.7 10*3/uL — ABNORMAL LOW (ref 0.9–3.3)

## 2010-04-26 ENCOUNTER — Encounter: Admission: RE | Admit: 2010-04-26 | Discharge: 2010-04-26 | Payer: Self-pay | Admitting: Family Medicine

## 2010-05-05 ENCOUNTER — Ambulatory Visit: Payer: Self-pay | Admitting: Internal Medicine

## 2010-05-08 LAB — CBC WITH DIFFERENTIAL/PLATELET
BASO%: 0.1 % (ref 0.0–2.0)
HCT: 36.6 % (ref 34.8–46.6)
MCHC: 34.4 g/dL (ref 31.5–36.0)
MONO#: 0.7 10*3/uL (ref 0.1–0.9)
NEUT#: 6.6 10*3/uL — ABNORMAL HIGH (ref 1.5–6.5)
NEUT%: 80.2 % — ABNORMAL HIGH (ref 38.4–76.8)
RBC: 4.05 10*6/uL (ref 3.70–5.45)
WBC: 8.3 10*3/uL (ref 3.9–10.3)
lymph#: 0.7 10*3/uL — ABNORMAL LOW (ref 0.9–3.3)

## 2010-05-08 LAB — COMPREHENSIVE METABOLIC PANEL
ALT: 21 U/L (ref 0–35)
CO2: 27 mEq/L (ref 19–32)
Calcium: 9.6 mg/dL (ref 8.4–10.5)
Chloride: 104 mEq/L (ref 96–112)
Sodium: 141 mEq/L (ref 135–145)
Total Protein: 6.1 g/dL (ref 6.0–8.3)

## 2010-06-06 ENCOUNTER — Ambulatory Visit: Payer: Self-pay | Admitting: Internal Medicine

## 2010-06-08 LAB — CBC WITH DIFFERENTIAL/PLATELET
BASO%: 0.1 % (ref 0.0–2.0)
EOS%: 5.6 % (ref 0.0–7.0)
Eosinophils Absolute: 0.2 10*3/uL (ref 0.0–0.5)
LYMPH%: 14 % (ref 14.0–49.7)
MCH: 31.4 pg (ref 25.1–34.0)
MCHC: 34.3 g/dL (ref 31.5–36.0)
MCV: 91.8 fL (ref 79.5–101.0)
MONO%: 8.2 % (ref 0.0–14.0)
NEUT#: 3.2 10*3/uL (ref 1.5–6.5)
Platelets: 283 10*3/uL (ref 145–400)
RBC: 4.01 10*6/uL (ref 3.70–5.45)
RDW: 15.6 % — ABNORMAL HIGH (ref 11.2–14.5)

## 2010-06-08 LAB — PROTIME-INR
INR: 1.1 — ABNORMAL LOW (ref 2.00–3.50)
Protime: 13.2 Seconds (ref 10.6–13.4)

## 2010-06-09 LAB — COMPREHENSIVE METABOLIC PANEL
ALT: 20 U/L (ref 0–35)
Alkaline Phosphatase: 55 U/L (ref 39–117)
CO2: 20 mEq/L (ref 19–32)
Creatinine, Ser: 0.72 mg/dL (ref 0.40–1.20)
Sodium: 141 mEq/L (ref 135–145)
Total Bilirubin: 1 mg/dL (ref 0.3–1.2)
Total Protein: 5.6 g/dL — ABNORMAL LOW (ref 6.0–8.3)

## 2010-06-09 LAB — APTT: aPTT: 33 seconds (ref 24–37)

## 2010-06-13 ENCOUNTER — Ambulatory Visit (HOSPITAL_COMMUNITY): Admission: RE | Admit: 2010-06-13 | Discharge: 2010-06-14 | Payer: Self-pay | Admitting: Urology

## 2010-06-13 HISTORY — PX: OTHER SURGICAL HISTORY: SHX169

## 2010-07-04 ENCOUNTER — Ambulatory Visit (HOSPITAL_COMMUNITY): Admission: RE | Admit: 2010-07-04 | Discharge: 2010-07-04 | Payer: Self-pay | Admitting: Internal Medicine

## 2010-07-06 ENCOUNTER — Ambulatory Visit: Payer: Self-pay | Admitting: Internal Medicine

## 2010-07-06 LAB — CBC WITH DIFFERENTIAL/PLATELET
BASO%: 0.1 % (ref 0.0–2.0)
Eosinophils Absolute: 0.4 10*3/uL (ref 0.0–0.5)
HCT: 36.1 % (ref 34.8–46.6)
LYMPH%: 13.6 % — ABNORMAL LOW (ref 14.0–49.7)
MCHC: 34.1 g/dL (ref 31.5–36.0)
MONO#: 0.5 10*3/uL (ref 0.1–0.9)
NEUT#: 2.9 10*3/uL (ref 1.5–6.5)
NEUT%: 67 % (ref 38.4–76.8)
Platelets: 259 10*3/uL (ref 145–400)
RBC: 3.89 10*6/uL (ref 3.70–5.45)
WBC: 4.3 10*3/uL (ref 3.9–10.3)
lymph#: 0.6 10*3/uL — ABNORMAL LOW (ref 0.9–3.3)

## 2010-07-06 LAB — COMPREHENSIVE METABOLIC PANEL
ALT: 22 U/L (ref 0–35)
CO2: 26 mEq/L (ref 19–32)
Calcium: 8.9 mg/dL (ref 8.4–10.5)
Chloride: 106 mEq/L (ref 96–112)
Glucose, Bld: 78 mg/dL (ref 70–99)
Sodium: 142 mEq/L (ref 135–145)
Total Bilirubin: 0.8 mg/dL (ref 0.3–1.2)
Total Protein: 5.9 g/dL — ABNORMAL LOW (ref 6.0–8.3)

## 2010-08-03 LAB — CBC WITH DIFFERENTIAL/PLATELET
Basophils Absolute: 0 10*3/uL (ref 0.0–0.1)
Eosinophils Absolute: 0.2 10*3/uL (ref 0.0–0.5)
HCT: 35.7 % (ref 34.8–46.6)
HGB: 12.2 g/dL (ref 11.6–15.9)
LYMPH%: 11.6 % — ABNORMAL LOW (ref 14.0–49.7)
MONO#: 0.5 10*3/uL (ref 0.1–0.9)
NEUT#: 3.6 10*3/uL (ref 1.5–6.5)
NEUT%: 73 % (ref 38.4–76.8)
Platelets: 255 10*3/uL (ref 145–400)
WBC: 4.9 10*3/uL (ref 3.9–10.3)
lymph#: 0.6 10*3/uL — ABNORMAL LOW (ref 0.9–3.3)

## 2010-08-03 LAB — COMPREHENSIVE METABOLIC PANEL
CO2: 25 mEq/L (ref 19–32)
Calcium: 9.6 mg/dL (ref 8.4–10.5)
Chloride: 106 mEq/L (ref 96–112)
Creatinine, Ser: 0.77 mg/dL (ref 0.40–1.20)
Glucose, Bld: 83 mg/dL (ref 70–99)
Total Bilirubin: 1 mg/dL (ref 0.3–1.2)

## 2010-08-29 ENCOUNTER — Ambulatory Visit: Payer: Self-pay | Admitting: Internal Medicine

## 2010-08-31 LAB — CBC WITH DIFFERENTIAL/PLATELET
Eosinophils Absolute: 0.3 10*3/uL (ref 0.0–0.5)
LYMPH%: 12.2 % — ABNORMAL LOW (ref 14.0–49.7)
MONO#: 0.5 10*3/uL (ref 0.1–0.9)
NEUT#: 3.8 10*3/uL (ref 1.5–6.5)
Platelets: 255 10*3/uL (ref 145–400)
RBC: 3.82 10*6/uL (ref 3.70–5.45)
RDW: 15.1 % — ABNORMAL HIGH (ref 11.2–14.5)
WBC: 5.2 10*3/uL (ref 3.9–10.3)
lymph#: 0.6 10*3/uL — ABNORMAL LOW (ref 0.9–3.3)

## 2010-08-31 LAB — COMPREHENSIVE METABOLIC PANEL
CO2: 23 mEq/L (ref 19–32)
Calcium: 8.7 mg/dL (ref 8.4–10.5)
Creatinine, Ser: 0.68 mg/dL (ref 0.40–1.20)
Glucose, Bld: 82 mg/dL (ref 70–99)
Sodium: 142 mEq/L (ref 135–145)
Total Bilirubin: 0.8 mg/dL (ref 0.3–1.2)
Total Protein: 5.3 g/dL — ABNORMAL LOW (ref 6.0–8.3)

## 2010-09-26 ENCOUNTER — Ambulatory Visit (HOSPITAL_COMMUNITY): Admission: RE | Admit: 2010-09-26 | Discharge: 2010-09-26 | Payer: Self-pay | Admitting: Internal Medicine

## 2010-09-28 ENCOUNTER — Ambulatory Visit: Payer: Self-pay | Admitting: Internal Medicine

## 2010-09-28 LAB — CBC WITH DIFFERENTIAL/PLATELET
BASO%: 0.3 % (ref 0.0–2.0)
Basophils Absolute: 0 10*3/uL (ref 0.0–0.1)
EOS%: 4.3 % (ref 0.0–7.0)
HCT: 34.5 % — ABNORMAL LOW (ref 34.8–46.6)
HGB: 11.8 g/dL (ref 11.6–15.9)
MCH: 31.3 pg (ref 25.1–34.0)
MONO#: 0.5 10*3/uL (ref 0.1–0.9)
NEUT%: 74.1 % (ref 38.4–76.8)
RDW: 14.7 % — ABNORMAL HIGH (ref 11.2–14.5)
WBC: 5.4 10*3/uL (ref 3.9–10.3)
lymph#: 0.7 10*3/uL — ABNORMAL LOW (ref 0.9–3.3)

## 2010-09-28 LAB — COMPREHENSIVE METABOLIC PANEL
Alkaline Phosphatase: 57 U/L (ref 39–117)
BUN: 19 mg/dL (ref 6–23)
Glucose, Bld: 92 mg/dL (ref 70–99)
Total Bilirubin: 0.8 mg/dL (ref 0.3–1.2)
Total Protein: 5.7 g/dL — ABNORMAL LOW (ref 6.0–8.3)

## 2010-10-26 LAB — CBC WITH DIFFERENTIAL/PLATELET
Basophils Absolute: 0 10*3/uL (ref 0.0–0.1)
Eosinophils Absolute: 0.3 10*3/uL (ref 0.0–0.5)
HCT: 37.4 % (ref 34.8–46.6)
HGB: 13.1 g/dL (ref 11.6–15.9)
LYMPH%: 10.9 % — ABNORMAL LOW (ref 14.0–49.7)
MCV: 91 fL (ref 79.5–101.0)
MONO#: 0.6 10*3/uL (ref 0.1–0.9)
MONO%: 11.3 % (ref 0.0–14.0)
NEUT#: 3.5 10*3/uL (ref 1.5–6.5)
NEUT%: 72.1 % (ref 38.4–76.8)
Platelets: 268 10*3/uL (ref 145–400)
RBC: 4.11 10*6/uL (ref 3.70–5.45)
WBC: 4.9 10*3/uL (ref 3.9–10.3)

## 2010-10-26 LAB — COMPREHENSIVE METABOLIC PANEL
AST: 24 U/L (ref 0–37)
Albumin: 4.4 g/dL (ref 3.5–5.2)
BUN: 21 mg/dL (ref 6–23)
Calcium: 9.5 mg/dL (ref 8.4–10.5)
Chloride: 104 mEq/L (ref 96–112)
Glucose, Bld: 82 mg/dL (ref 70–99)
Potassium: 4.4 mEq/L (ref 3.5–5.3)
Total Protein: 6.4 g/dL (ref 6.0–8.3)

## 2010-11-21 ENCOUNTER — Ambulatory Visit: Payer: Self-pay | Admitting: Internal Medicine

## 2010-11-23 ENCOUNTER — Ambulatory Visit (HOSPITAL_COMMUNITY)
Admission: RE | Admit: 2010-11-23 | Discharge: 2010-11-23 | Payer: Self-pay | Source: Home / Self Care | Admitting: Internal Medicine

## 2010-11-23 LAB — COMPREHENSIVE METABOLIC PANEL
AST: 26 U/L (ref 0–37)
Alkaline Phosphatase: 64 U/L (ref 39–117)
BUN: 21 mg/dL (ref 6–23)
CO2: 27 mEq/L (ref 19–32)
Creatinine, Ser: 0.8 mg/dL (ref 0.40–1.20)
Glucose, Bld: 71 mg/dL (ref 70–99)
Sodium: 141 mEq/L (ref 135–145)
Total Bilirubin: 1.2 mg/dL (ref 0.3–1.2)
Total Protein: 6.2 g/dL (ref 6.0–8.3)

## 2010-11-23 LAB — CBC WITH DIFFERENTIAL/PLATELET
Eosinophils Absolute: 0.2 10*3/uL (ref 0.0–0.5)
HCT: 38.5 % (ref 34.8–46.6)
LYMPH%: 11 % — ABNORMAL LOW (ref 14.0–49.7)
MCHC: 34.4 g/dL (ref 31.5–36.0)
MCV: 91.9 fL (ref 79.5–101.0)
MONO#: 0.5 10*3/uL (ref 0.1–0.9)
MONO%: 12.7 % (ref 0.0–14.0)
NEUT#: 3 10*3/uL (ref 1.5–6.5)
NEUT%: 70.5 % (ref 38.4–76.8)
Platelets: 281 10*3/uL (ref 145–400)
RBC: 4.19 10*6/uL (ref 3.70–5.45)
WBC: 4.3 10*3/uL (ref 3.9–10.3)
lymph#: 0.5 10*3/uL — ABNORMAL LOW (ref 0.9–3.3)

## 2010-12-22 ENCOUNTER — Ambulatory Visit: Payer: Self-pay | Admitting: Internal Medicine

## 2010-12-24 HISTORY — PX: OTHER SURGICAL HISTORY: SHX169

## 2010-12-26 LAB — COMPREHENSIVE METABOLIC PANEL
ALT: 15 U/L (ref 0–35)
AST: 20 U/L (ref 0–37)
Albumin: 3.9 g/dL (ref 3.5–5.2)
Alkaline Phosphatase: 58 U/L (ref 39–117)
BUN: 16 mg/dL (ref 6–23)
CO2: 25 mEq/L (ref 19–32)
Calcium: 9.6 mg/dL (ref 8.4–10.5)
Chloride: 108 mEq/L (ref 96–112)
Creatinine, Ser: 0.71 mg/dL (ref 0.40–1.20)
Glucose, Bld: 78 mg/dL (ref 70–99)
Potassium: 3.6 mEq/L (ref 3.5–5.3)
Sodium: 143 mEq/L (ref 135–145)
Total Bilirubin: 0.8 mg/dL (ref 0.3–1.2)
Total Protein: 6.1 g/dL (ref 6.0–8.3)

## 2010-12-26 LAB — CBC WITH DIFFERENTIAL/PLATELET
BASO%: 0.2 % (ref 0.0–2.0)
HCT: 36.5 % (ref 34.8–46.6)
MCHC: 34.6 g/dL (ref 31.5–36.0)
MONO#: 0.6 10*3/uL (ref 0.1–0.9)
NEUT%: 73.8 % (ref 38.4–76.8)
RDW: 14.6 % — ABNORMAL HIGH (ref 11.2–14.5)
WBC: 5.1 10*3/uL (ref 3.9–10.3)
lymph#: 0.6 10*3/uL — ABNORMAL LOW (ref 0.9–3.3)

## 2011-01-13 ENCOUNTER — Emergency Department (HOSPITAL_COMMUNITY)
Admission: EM | Admit: 2011-01-13 | Discharge: 2011-01-13 | Payer: Self-pay | Source: Home / Self Care | Admitting: Emergency Medicine

## 2011-01-14 ENCOUNTER — Encounter: Payer: Self-pay | Admitting: Internal Medicine

## 2011-01-16 LAB — URINALYSIS, ROUTINE W REFLEX MICROSCOPIC
Specific Gravity, Urine: 1.025 (ref 1.005–1.030)
Urine Glucose, Fasting: NEGATIVE mg/dL
pH: 6 (ref 5.0–8.0)

## 2011-01-16 LAB — DIFFERENTIAL
Basophils Absolute: 0 10*3/uL (ref 0.0–0.1)
Eosinophils Relative: 2 % (ref 0–5)
Lymphocytes Relative: 8 % — ABNORMAL LOW (ref 12–46)
Monocytes Absolute: 0.6 10*3/uL (ref 0.1–1.0)

## 2011-01-16 LAB — COMPREHENSIVE METABOLIC PANEL
BUN: 12 mg/dL (ref 6–23)
Calcium: 8.8 mg/dL (ref 8.4–10.5)
Glucose, Bld: 101 mg/dL — ABNORMAL HIGH (ref 70–99)
Sodium: 139 mEq/L (ref 135–145)
Total Protein: 5.9 g/dL — ABNORMAL LOW (ref 6.0–8.3)

## 2011-01-16 LAB — OVA AND PARASITE EXAMINATION: Ova and parasites: NONE SEEN

## 2011-01-16 LAB — URINE MICROSCOPIC-ADD ON

## 2011-01-16 LAB — URINE CULTURE
Colony Count: >=100000
Culture  Setup Time: 201201211336

## 2011-01-16 LAB — CBC
HCT: 37 % (ref 36.0–46.0)
MCHC: 35.7 g/dL (ref 30.0–36.0)
MCV: 86.9 fL (ref 78.0–100.0)
RDW: 14.4 % (ref 11.5–15.5)

## 2011-01-17 LAB — STOOL CULTURE

## 2011-01-22 ENCOUNTER — Other Ambulatory Visit: Payer: Self-pay | Admitting: Internal Medicine

## 2011-01-22 DIAGNOSIS — C3490 Malignant neoplasm of unspecified part of unspecified bronchus or lung: Secondary | ICD-10-CM

## 2011-01-23 ENCOUNTER — Ambulatory Visit (HOSPITAL_COMMUNITY): Admission: RE | Admit: 2011-01-23 | Payer: Self-pay | Source: Home / Self Care | Admitting: Internal Medicine

## 2011-01-27 ENCOUNTER — Encounter: Payer: Self-pay | Admitting: Internal Medicine

## 2011-01-30 ENCOUNTER — Other Ambulatory Visit (HOSPITAL_COMMUNITY): Payer: MEDICARE

## 2011-02-06 ENCOUNTER — Other Ambulatory Visit: Payer: Self-pay | Admitting: Internal Medicine

## 2011-02-06 DIAGNOSIS — C3490 Malignant neoplasm of unspecified part of unspecified bronchus or lung: Secondary | ICD-10-CM

## 2011-02-14 ENCOUNTER — Other Ambulatory Visit: Payer: Self-pay | Admitting: Internal Medicine

## 2011-02-14 ENCOUNTER — Encounter (HOSPITAL_BASED_OUTPATIENT_CLINIC_OR_DEPARTMENT_OTHER): Payer: MEDICARE | Admitting: Internal Medicine

## 2011-02-14 ENCOUNTER — Ambulatory Visit (HOSPITAL_COMMUNITY)
Admission: RE | Admit: 2011-02-14 | Discharge: 2011-02-14 | Disposition: A | Discharge status: Home / Self Care | Payer: MEDICARE | Source: Ambulatory Visit | Attending: Internal Medicine | Admitting: Internal Medicine

## 2011-02-14 DIAGNOSIS — R05 Cough: Secondary | ICD-10-CM | POA: Insufficient documentation

## 2011-02-14 DIAGNOSIS — R059 Cough, unspecified: Secondary | ICD-10-CM | POA: Insufficient documentation

## 2011-02-14 DIAGNOSIS — K8020 Calculus of gallbladder without cholecystitis without obstruction: Secondary | ICD-10-CM | POA: Insufficient documentation

## 2011-02-14 DIAGNOSIS — C3490 Malignant neoplasm of unspecified part of unspecified bronchus or lung: Secondary | ICD-10-CM | POA: Insufficient documentation

## 2011-02-14 DIAGNOSIS — R197 Diarrhea, unspecified: Secondary | ICD-10-CM | POA: Insufficient documentation

## 2011-02-14 DIAGNOSIS — J90 Pleural effusion, not elsewhere classified: Secondary | ICD-10-CM | POA: Insufficient documentation

## 2011-02-14 DIAGNOSIS — C342 Malignant neoplasm of middle lobe, bronchus or lung: Secondary | ICD-10-CM

## 2011-02-14 DIAGNOSIS — I700 Atherosclerosis of aorta: Secondary | ICD-10-CM | POA: Insufficient documentation

## 2011-02-14 DIAGNOSIS — R0601 Orthopnea: Secondary | ICD-10-CM | POA: Insufficient documentation

## 2011-02-14 LAB — CBC WITH DIFFERENTIAL/PLATELET
BASO%: 0.2 % (ref 0.0–2.0)
Basophils Absolute: 0 10*3/uL (ref 0.0–0.1)
EOS%: 5.3 % (ref 0.0–7.0)
HGB: 12.5 g/dL (ref 11.6–15.9)
MCH: 31 pg (ref 25.1–34.0)
RBC: 4.04 10*6/uL (ref 3.70–5.45)
RDW: 15 % — ABNORMAL HIGH (ref 11.2–14.5)
lymph#: 0.8 10*3/uL — ABNORMAL LOW (ref 0.9–3.3)

## 2011-02-14 LAB — CMP (CANCER CENTER ONLY)
ALT(SGPT): 25 U/L (ref 10–47)
AST: 25 U/L (ref 11–38)
Albumin: 3 g/dL — ABNORMAL LOW (ref 3.3–5.5)
Alkaline Phosphatase: 51 U/L (ref 26–84)
BUN, Bld: 14 mg/dL (ref 7–22)
Calcium: 8.9 mg/dL (ref 8.0–10.3)
Chloride: 100 mEq/L (ref 98–108)
Potassium: 3.9 mEq/L (ref 3.3–4.7)
Sodium: 144 mEq/L (ref 128–145)
Total Protein: 6.2 g/dL — ABNORMAL LOW (ref 6.4–8.1)

## 2011-02-14 MED ORDER — IOHEXOL 300 MG/ML  SOLN: 100.0000 mL | Freq: Once | INTRAMUSCULAR | Status: AC | PRN

## 2011-02-19 ENCOUNTER — Other Ambulatory Visit: Payer: Self-pay | Admitting: Internal Medicine

## 2011-02-19 ENCOUNTER — Encounter (HOSPITAL_BASED_OUTPATIENT_CLINIC_OR_DEPARTMENT_OTHER): Payer: MEDICARE | Admitting: Internal Medicine

## 2011-02-19 DIAGNOSIS — R51 Headache: Secondary | ICD-10-CM

## 2011-02-19 DIAGNOSIS — C342 Malignant neoplasm of middle lobe, bronchus or lung: Secondary | ICD-10-CM

## 2011-02-19 DIAGNOSIS — C3490 Malignant neoplasm of unspecified part of unspecified bronchus or lung: Secondary | ICD-10-CM

## 2011-02-26 ENCOUNTER — Other Ambulatory Visit: Payer: Self-pay | Admitting: Interventional Radiology

## 2011-02-26 ENCOUNTER — Ambulatory Visit (HOSPITAL_COMMUNITY)
Admission: RE | Admit: 2011-02-26 | Discharge: 2011-02-26 | Disposition: A | Discharge status: Home / Self Care | Payer: MEDICARE | Source: Ambulatory Visit | Attending: Internal Medicine | Admitting: Internal Medicine

## 2011-02-26 ENCOUNTER — Other Ambulatory Visit: Payer: Self-pay | Admitting: Internal Medicine

## 2011-02-26 DIAGNOSIS — Z87891 Personal history of nicotine dependence: Secondary | ICD-10-CM | POA: Insufficient documentation

## 2011-02-26 DIAGNOSIS — C3490 Malignant neoplasm of unspecified part of unspecified bronchus or lung: Secondary | ICD-10-CM

## 2011-02-26 DIAGNOSIS — Z9889 Other specified postprocedural states: Secondary | ICD-10-CM

## 2011-02-26 DIAGNOSIS — J449 Chronic obstructive pulmonary disease, unspecified: Secondary | ICD-10-CM | POA: Insufficient documentation

## 2011-02-26 DIAGNOSIS — R0989 Other specified symptoms and signs involving the circulatory and respiratory systems: Secondary | ICD-10-CM | POA: Insufficient documentation

## 2011-02-26 DIAGNOSIS — J4489 Other specified chronic obstructive pulmonary disease: Secondary | ICD-10-CM | POA: Insufficient documentation

## 2011-02-26 DIAGNOSIS — R0609 Other forms of dyspnea: Secondary | ICD-10-CM | POA: Insufficient documentation

## 2011-02-26 DIAGNOSIS — J90 Pleural effusion, not elsewhere classified: Secondary | ICD-10-CM | POA: Insufficient documentation

## 2011-03-11 LAB — BASIC METABOLIC PANEL
BUN: 5 mg/dL — ABNORMAL LOW (ref 6–23)
CO2: 27 mEq/L (ref 19–32)
Calcium: 7.9 mg/dL — ABNORMAL LOW (ref 8.4–10.5)
Calcium: 8 mg/dL — ABNORMAL LOW (ref 8.4–10.5)
Creatinine, Ser: 0.68 mg/dL (ref 0.4–1.2)
GFR calc Af Amer: >60 mL/min (ref >60)
GFR calc Af Amer: >60 mL/min (ref >60)
GFR calc non Af Amer: >60 mL/min (ref >60)
Glucose, Bld: 108 mg/dL — ABNORMAL HIGH (ref 70–99)
Sodium: 140 mEq/L (ref 135–145)

## 2011-03-11 LAB — CBC
HCT: 35.6 % — ABNORMAL LOW (ref 36.0–46.0)
Hemoglobin: 11.8 g/dL — ABNORMAL LOW (ref 12.0–15.0)
MCHC: 32.4 g/dL (ref 30.0–36.0)
MCHC: 33 g/dL (ref 30.0–36.0)
MCV: 92.5 fL (ref 78.0–100.0)
MCV: 93.2 fL (ref 78.0–100.0)
Platelets: 206 10*3/uL (ref 150–400)
Platelets: 237 10*3/uL (ref 150–400)
RBC: 3.47 MIL/uL — ABNORMAL LOW (ref 3.87–5.11)
RBC: 3.85 MIL/uL — ABNORMAL LOW (ref 3.87–5.11)
WBC: 6.6 10*3/uL (ref 4.0–10.5)
WBC: 9.1 10*3/uL (ref 4.0–10.5)

## 2011-03-11 LAB — ABO/RH: ABO/RH(D): A POS

## 2011-03-11 LAB — TYPE AND SCREEN: ABO/RH(D): A POS

## 2011-03-11 LAB — SURGICAL PCR SCREEN: MRSA, PCR: NEGATIVE

## 2011-03-20 ENCOUNTER — Other Ambulatory Visit: Payer: Self-pay | Admitting: Internal Medicine

## 2011-03-20 ENCOUNTER — Encounter (HOSPITAL_BASED_OUTPATIENT_CLINIC_OR_DEPARTMENT_OTHER): Payer: MEDICARE | Admitting: Internal Medicine

## 2011-03-20 DIAGNOSIS — R51 Headache: Secondary | ICD-10-CM

## 2011-03-20 DIAGNOSIS — C342 Malignant neoplasm of middle lobe, bronchus or lung: Secondary | ICD-10-CM

## 2011-03-20 LAB — COMPREHENSIVE METABOLIC PANEL
ALT: 17 U/L (ref 0–35)
Albumin: 3.8 g/dL (ref 3.5–5.2)
CO2: 24 mEq/L (ref 19–32)
Calcium: 8.8 mg/dL (ref 8.4–10.5)
Chloride: 109 mEq/L (ref 96–112)
Glucose, Bld: 93 mg/dL (ref 70–99)
Potassium: 4 mEq/L (ref 3.5–5.3)
Sodium: 144 mEq/L (ref 135–145)
Total Protein: 5.9 g/dL — ABNORMAL LOW (ref 6.0–8.3)

## 2011-03-20 LAB — CBC WITH DIFFERENTIAL/PLATELET
BASO%: 0.2 % (ref 0.0–2.0)
Eosinophils Absolute: 0.2 10*3/uL (ref 0.0–0.5)
LYMPH%: 11.6 % — ABNORMAL LOW (ref 14.0–49.7)
MCHC: 34.1 g/dL (ref 31.5–36.0)
MONO#: 0.4 10*3/uL (ref 0.1–0.9)
NEUT#: 4 10*3/uL (ref 1.5–6.5)
Platelets: 269 10*3/uL (ref 145–400)
RBC: 3.85 10*6/uL (ref 3.70–5.45)
WBC: 5.3 10*3/uL (ref 3.9–10.3)
lymph#: 0.6 10*3/uL — ABNORMAL LOW (ref 0.9–3.3)

## 2011-03-20 LAB — TSH: TSH: 2.151 u[IU]/mL (ref 0.350–4.500)

## 2011-04-09 LAB — GLUCOSE, CAPILLARY: Glucose-Capillary: 97 mg/dL (ref 70–99)

## 2011-04-10 ENCOUNTER — Other Ambulatory Visit: Payer: Self-pay | Admitting: Family Medicine

## 2011-04-10 DIAGNOSIS — Z1231 Encounter for screening mammogram for malignant neoplasm of breast: Secondary | ICD-10-CM

## 2011-04-10 LAB — CBC
Hemoglobin: 13 g/dL (ref 12.0–15.0)
MCHC: 34.1 g/dL (ref 30.0–36.0)
Platelets: 281 10*3/uL (ref 150–400)
RDW: 14.4 % (ref 11.5–15.5)

## 2011-04-10 LAB — FUNGUS CULTURE W SMEAR: Fungal Smear: NONE SEEN

## 2011-04-10 LAB — COMPREHENSIVE METABOLIC PANEL
ALT: 17 U/L (ref 0–35)
Albumin: 3.6 g/dL (ref 3.5–5.2)
Calcium: 9.5 mg/dL (ref 8.4–10.5)
Glucose, Bld: 101 mg/dL — ABNORMAL HIGH (ref 70–99)
Potassium: 3.9 mEq/L (ref 3.5–5.1)
Sodium: 140 mEq/L (ref 135–145)
Total Protein: 6.2 g/dL (ref 6.0–8.3)

## 2011-04-10 LAB — TYPE AND SCREEN
ABO/RH(D): A POS
Antibody Screen: NEGATIVE

## 2011-04-10 LAB — PROTIME-INR
INR: 1 (ref 0.00–1.49)
Prothrombin Time: 13 seconds (ref 11.6–15.2)

## 2011-04-10 LAB — CULTURE, RESPIRATORY W GRAM STAIN

## 2011-04-18 ENCOUNTER — Other Ambulatory Visit: Payer: Self-pay | Admitting: Internal Medicine

## 2011-04-18 ENCOUNTER — Encounter (HOSPITAL_BASED_OUTPATIENT_CLINIC_OR_DEPARTMENT_OTHER): Payer: MEDICARE | Admitting: Internal Medicine

## 2011-04-18 DIAGNOSIS — C3490 Malignant neoplasm of unspecified part of unspecified bronchus or lung: Secondary | ICD-10-CM

## 2011-04-18 DIAGNOSIS — R51 Headache: Secondary | ICD-10-CM

## 2011-04-18 DIAGNOSIS — C342 Malignant neoplasm of middle lobe, bronchus or lung: Secondary | ICD-10-CM

## 2011-04-18 LAB — CBC WITH DIFFERENTIAL/PLATELET
Basophils Absolute: 0 10*3/uL (ref 0.0–0.1)
Eosinophils Absolute: 0.4 10*3/uL (ref 0.0–0.5)
HCT: 35.9 % (ref 34.8–46.6)
LYMPH%: 13.2 % — ABNORMAL LOW (ref 14.0–49.7)
MONO#: 0.5 10*3/uL (ref 0.1–0.9)
NEUT#: 2.9 10*3/uL (ref 1.5–6.5)
NEUT%: 67 % (ref 38.4–76.8)
Platelets: 251 10*3/uL (ref 145–400)
WBC: 4.4 10*3/uL (ref 3.9–10.3)

## 2011-04-18 LAB — COMPREHENSIVE METABOLIC PANEL
BUN: 19 mg/dL (ref 6–23)
CO2: 24 mEq/L (ref 19–32)
Creatinine, Ser: 0.69 mg/dL (ref 0.40–1.20)
Glucose, Bld: 65 mg/dL — ABNORMAL LOW (ref 70–99)
Total Bilirubin: 0.6 mg/dL (ref 0.3–1.2)
Total Protein: 5.7 g/dL — ABNORMAL LOW (ref 6.0–8.3)

## 2011-04-30 ENCOUNTER — Ambulatory Visit
Admission: RE | Admit: 2011-04-30 | Discharge: 2011-04-30 | Disposition: A | Discharge status: Home / Self Care | Payer: MEDICARE | Source: Ambulatory Visit | Attending: Family Medicine | Admitting: Family Medicine

## 2011-04-30 DIAGNOSIS — Z1231 Encounter for screening mammogram for malignant neoplasm of breast: Secondary | ICD-10-CM

## 2011-05-08 NOTE — Letter (Signed)
January 27, 2009   Dwana Curd. Para March, MD  [address]  [place], [place] [address]   Re:  Dana [name], Dana [name]                DOB:  12/18/1935   Dear Dr. 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  She still has a questionable  node in her left axilla, which I hope at this point that is going to be  negative, but we will plan to do a needle of this on Monday.  She is  seeing Dr. Arbutus Ped and Dr. Mitzi Hansen to start radiation and chemotherapy and  if she has good response, we may have to reevaluate her regarding  surgery.  I appreciate the opportunity of seeing the patient.   Sincerely.   Dana [name], M.D.  Electronically Signed   DPB/MEDQ  D:  01/27/2009  T:  01/27/2009  Job:  [id number]

## 2011-05-08 NOTE — H&P (Signed)
Dana [name], Dana [name]                ACCOUNT NO.:  [id number]   MEDICAL RECORD NO.:  [id number]          PATIENT TYPE:  INP   LOCATION:  2013                         FACILITY:  [facility]   PHYSICIAN:  Michiel Cowboy, MDDATE OF BIRTH:  [birth year]   DATE OF ADMISSION:  12/22/2008  DATE OF DISCHARGE:                              HISTORY & PHYSICAL   PRIMARY CARE Keaun Schnabel:  Dr. Raechel Ache.   CHIEF COMPLAINT:  Syncope.  The patient is a pleasant 76 year old female  with past medical history significant for osteoporosis.  Was at her  baseline of health up until yesterday.  She slipped in her kitchen and  fell, landing on her right ankle.  Since then she has been having a lot  of pain.  Today, this morning she tried to get up, use the bathroom.  She hobbled onto the toilet, used the restroom, and then was trying to  get up but could not.  She was having some lightheadedness and nausea,  she thinks pain related.  She was calling out for her husband to get her  a cane so she could walk and he was somewhat slow to respond at which  point she tried to get up herself, tried to put her weight on her  injured leg, and lost consciousness.  Per her husband, she was out for a  few minutes and was incontinent of urine but her husband is currently  not in her room.  She, herself, cannot recollect this event but after  she woke up apparently had no other complications but presented to her  primary care Nathania Waldman who obtained plain films of her ankle which showed  a fracture, at which point she was admitted by internal medicine for  syncope with consult to orthopedics herein.  Otherwise, on review of  systems, no chest pain, no shortness of breath, no constipation,  diarrhea, no fever, no chills.  Otherwise, the review of systems is  unremarkable.   PAST MEDICAL HISTORY:  Significant for osteoporosis, remote history of  smoking but nothing currently, also a history of COPD and heartburn.   SOCIAL  HISTORY:  Used to smoke in the past but quit many years ago.  Drinks about a glass of wine a day.  Per her husband is very active.   FAMILY HISTORY:  Noncontributory.   ALLERGIES:  No known drug allergies.   MEDICATIONS:  Patient currently takes nabumetone 250 mg daily, Spiriva  daily, nabumetone 10 mg p.o. daily, calcium 3 x a day, glucosamine as  needed, aspirin 81 mg daily, vitamin D3, omega-3 fatty acid, and a B  complex.   PHYSICAL EXAMINATION:  VITALS:  Temperature 97.9, heart rate 77,  respirations 18, satting 97% on room air, blood pressure 145/98.  The patient appears to be well, in no acute distress.  HEAD:  Nontraumatic.  Moist mucous membranes.  LUNGS:  Clear to auscultation bilaterally.  HEART:  Regular rate and rhythm.  No murmurs appreciated.  ABDOMEN:  Soft, nontender, nondistended.  LOWER EXTREMITIES:  There is no peripheral edema but right lower  extremity with bony deformity  noted and some swelling and bruising.  Pulses palpable bilaterally though.   LABORATORY DATA:  White blood cell count 6.8, hemoglobin 12.1.  Sodium  137, potassium 3.4, creatinine 0.69.  Cardiac enzymes within normal  limits.  EKG shows normal sinus rhythm.   ASSESSMENT AND PLAN:  1. Syncope.  Apparently appears to be possibly vasovagal or response      to severe pain but will admit for further observation to telemetry      4, obtain 2-D echo, and cycle cardiac enzymes.  If all the above      are within normal limits, I doubt that any further cardiac workup      will be necessary prior to proceeding to operation to repair her      ankle.  We will check fasting lipid panel in the morning,      hemoglobin A1c.  We will check TSH.  2. Slightly elevated blood pressure.  We will follow.  No history of      hypertension.  3. Questionable history of chronic obstructive pulmonary disease.  We      will continue Spiriva.  We will do albuterol p.r.n.  Apparently no      wheezing currently.  Appears  to be at baseline now.  We will obtain      chest x-ray.  4. Slightly low potassium.  We will replace.  5. Right ankle fracture.  Orthopedics films at bedside.  Will defer to      orthopedics for further management but give out pain meds.      Orthopedics to see patient today.  6. Prophylaxis.  Protonix and sequential compression devices for now.      Would defer to orthopedics if more aggressive deep venous      thrombosis prophylaxis would be appropriate.      Michiel Cowboy, MD  Electronically Signed     AVD/MEDQ  D:  12/22/2008  T:  12/22/2008  Job:  [id number]   cc:   Raechel Ache, Dr.

## 2011-05-08 NOTE — Op Note (Signed)
Dana [name], Dana [name]                ACCOUNT NO.:  [id number]   MEDICAL RECORD NO.:  [id number]          PATIENT TYPE:  AMB   LOCATION:  SDS                          FACILITY:  [facility]   PHYSICIAN:  Ines Bloomer, M.D. DATE OF BIRTH:  22-Aug-1935   DATE OF PROCEDURE:  01/26/2009  DATE OF DISCHARGE:                               OPERATIVE REPORT   PREOPERATIVE DIAGNOSIS:  Right middle lobe mass.   POSTOPERATIVE DIAGNOSIS:  Right middle lobe mass.   OPERATION PERFORMED:  Fiberoptic bronchoscopy and mediastinoscopy.   SURGEON:  Ines Bloomer, MD   ANESTHESIA:  General anesthesia.   PROCEDURE IN DETAIL:  After percutaneous insertion of all monitoring  lines, the patient underwent general anesthesia and was prepped and  draped in the usual sterile manner.  The video bronchoscope was first  passed through the endotracheal tube.  The carina was at the midline.  The left mainstem, left upper lobe, and left lower lobe orifices were  normal.  The right upper lobe, right middle lobe, and right lower lobe  orifices were normal.  On the right middle lobe, we used fluoro guidance  to do brushings of the medial segment of the right middle lobe where the  lesion was seen.  We also did transbronchial biopsies in this area.  The  video bronchoscope was removed and straightened.  The anterior neck was  prepped and draped in the usual sterile manner.  A transverse incision  was made at the sternal notch, carried down with electrocautery through  the subcutaneous tissue and fascia.  The patient had multiple large  nodes.  We biopsied four x2 2R, 1R, 4L, 2L and 7 nodes.  These were sent  for permanent section.  Wounds were closed with 3-0 Vicryl and Dermabond  for the skin.  The patient returned to the recovery room in stable  condition.      Ines Bloomer, M.D.  Electronically Signed     DPB/MEDQ  D:  01/26/2009  T:  01/26/2009  Job:  [id number]

## 2011-05-08 NOTE — Discharge Summary (Signed)
NAMEBRAYA, HABERMEHL                ACCOUNT NO.:  [id number]   MEDICAL RECORD NO.:  [id number]          PATIENT TYPE:  INP   LOCATION:  2013                         FACILITY:  [facility]   PHYSICIAN:  Michiel Cowboy, MDDATE OF BIRTH:  June 03, 1935   DATE OF ADMISSION:  12/22/2008  DATE OF DISCHARGE:  12/23/2008                               DISCHARGE SUMMARY   PRIMARY CARE Jake Fuhrmann:  Dr. __________   CONSULTATION:  Dr. Teryl Lucy with orthopedics.   DISCHARGE DIAGNOSES:  1. Right fibular fracture.  2. Syncope.  3. Chronic obstructive pulmonary disease.  4. Gastroesophageal reflux disease.  5. Osteoarthritis.  6. Hyperlipidemia, mild.  7. Urinary tract infection.   STUDIES:  The patient brought in her own film of her ankle which did  show a right nondisplaced fibula fracture.  Chest x-ray was done on  December 22, 2008.  The patient had no acute cardiopulmonary findings.  There was a right infrahilar density.  At some point, she will likely  need a CT scan  of the thorax done.  This could be done as an  outpatient.   EKG was unremarkable.  Cardiac enzymes were unremarkable.  The patient's  total cholesterol was slightly elevated at 225 with LDL of 142 and HDL  of 62.  Hemoglobin A1c was within normal limits.   HISTORY AND PHYSICAL:  Please see full H and P for details.  Briefly,  this is a 76 year old female who was at home and she slipped and hurt  her right ankle.  She then proceeded to stay at home and try to minimize  her activity.  She was in the bathroom using the restroom with a lot of  pain, having nausea and some fatigue.  She tried to get up off the  commode and used her right foot at which point she collapsed and  syncopized per her husband, maybe even lost urine and was unconscious  for about 2 minutes.  Of note, when she presented to her primary care  Shaquala Broeker, they obtained films of her ankle which showed a fracture at  which point she was sent to the Internal  Medicine for admission given  syncope.  While here, she was on telemetry which was unremarkable.  Cardiac enzymes were negative.  A 2-D echo was performed and significant  only for mild pulmonary hypertension which was felt to be secondary to  her COPD per cardiology with estimated peak right ventricular systolic  pressure of 35-40 mmHg.  Her EF was between 65-70%.  She may have mild  diastolic dysfunction, but otherwise unremarkable.  The patient did note  to be slightly orthostatic, received IV fluids and felt better.  At the  time of discharge, both her blood pressure and her heart rate was coming  up with standing up, which is more suggestive of mild deconditioning and  perhaps pain response.  The patient also was noted to have a mild  urinary tract infection for which she was treated with ciprofloxacin.   DISCHARGE MEDICATIONS:  1. Cipro 250 mg p.o. b.i.d. x5 days.  2. Prilosec 20 mg  p.o. nightly.  3. Colace 100 mg p.o. twice a day.  4. Percocet as needed for pain.   Those are all new medications.   Her home medications the patient may resume include; Spiriva, calcium,  glucosamine, aspirin 81 mg daily, vitamin D, Omega 3 fatty acid and B  complex.  Of note, the patient is to hold her nabumetoneuntil seen by  her primary care Arilla Hice.   Orthopedic surgery initially recommended home health PT/OT, but after  the patient was evaluated by physical therapy it was felt that this was  not necessary.  She may receive outpatient physical therapy after she is  seen by orthopedics.  She was discharged in a fracture boot or Cam  walker.  Per orthopedics, they recommended commode seat, but the patient  refused.  She also was written for a __________.      Michiel Cowboy, MD  Electronically Signed     AVD/MEDQ  D:  12/23/2008  T:  12/23/2008  Job:  [id number]   cc:   Eulas Post, MD  Kari Baars, M.D.

## 2011-05-08 NOTE — Assessment & Plan Note (Signed)
OFFICE VISIT   Dana [name], Dana [name]  DOB:  Aug 04, 1935                                        February 16, 2009  CHART #:  [id number]   The patient came for followup today.  Her mediastinoscopy incision is  healing well.  She started radiation and chemotherapy.  We gave her a  copy of her path reports.  Her blood pressure is 154/95, pulse 100,  respirations 18, and sats were 97%.  We will see her back again for  reevaluation after she completes her radiation and chemotherapy.   Ines Bloomer, M.D.  Electronically Signed   DPB/MEDQ  D:  02/16/2009  T:  02/16/2009  Job:  [id number]

## 2011-05-08 NOTE — Consult Note (Signed)
NAMENASTACIA, RAYBUCK                ACCOUNT NO.:  [id number]   MEDICAL RECORD NO.:  [id number]          PATIENT TYPE:  INP   LOCATION:  2013                         FACILITY:  [facility]   PHYSICIAN:  Eulas Post, MD    DATE OF BIRTH:  May 03, 1935   DATE OF CONSULTATION:  DATE OF DISCHARGE:                                 CONSULTATION   CHIEF COMPLAINT:  Right ankle pain.   HISTORY OF PRESENT ILLNESS:  The patient is a 76 year old white female  who slipped on a wet tile floor last night which was December 21, 2008,  in her kitchen, had significant ankle pain, felt a pop at that time.  She iced and elevated over the night.  She had difficulty sleeping over  the night.  Next morning, she got up and walked to the bathroom, had  significant pain and difficulty walking to the bathroom by the time she  got to the toilet.  She had an episode of nausea, vomiting, dizziness,  and passed out.  Her husband took her to [facility] who  diagnosed her with an ankle fracture and admitted her to the hospital  for her syncopal episode.  We were consulted by the [facility] to evaluate her right ankle for known ankle fracture.   ALLERGIES:  None.   CURRENT MEDICATIONS:  1. Calcium with vitamin D 3 times a day.  2. Vitamin D supplementation 2000 mg twice a day.  3. Fish oil daily.  4. Glucosamine daily.  5. Aspirin 81 mg daily.  6. Loratadine 10 mg daily.  7. Spiriva inhaler daily.  8. Prevacid daily.  9. Nabumetone 750 mg daily.   MEDICAL HISTORY:  Significant for osteoarthritis, COPD, gastroesophageal  reflux disease, history of bacterial vaginosis last week that was  treated with Flagyl, and she is better.   FAMILY HISTORY:  Mother is deceased, had a history of thyroid disease.  Father is deceased, history of bladder cancer and aneurysm.   SOCIAL HISTORY:  The patient is married.  She quit smoking 20 years ago.  She drinks a glass of wine each night.  She is  retired.   REVIEW OF SYSTEMS:  Significant for right ankle pain and ringing in her  ears, negative for all other systems reviewed.   OBJECTIVE:  VITAL SIGNS:  On examination, temperature is 97.9, pulse 77,  respirations are 20, blood pressure is 145/98, O2 sats are 97% on room  air.  GENERAL:  She is a well-nourished, well-developed 76 year old white  female who is alert and oriented x3, in no acute distress.  HEENT:  She is normocephalic and atraumatic.  Extraocular movements are  intact.  Pupils are equally round and reactive to light and  accommodation.  NECK:  Supple.  She has no lymphadenopathy in her axilla or in her neck.  CHEST:  Clear.  HEART:  Normal S1 and S2 with a regular rate and rhythm.  ABDOMEN:  Soft and nontender with positive bowel sounds.  EXTREMITIES:  Right ankle is significantly swollen.  She has no medial  tenderness.  No  tenderness over her foot.  She does have tenderness over  distal fibula.  She has no knee pain.  Left ankle has full range of  motion without pain, swelling, or deformity.  NEUROLOGIC:  She is alert and oriented x3.  Cranial nerves II-XII are  grossly intact.  She has a bilateral equal motor and sensory exam of  both her upper and lower extremities.   X-rays from [facility] do show a nondisplaced distal fibular  fracture.  Her mortise is intact.   ASSESSMENT:  1. Nondisplaced distal fibula fracture with intact mortise.  2. Chronic obstructive pulmonary disease.  3. Gastroesophageal reflux disease.  4. History of bacterial vaginosis.  5. Allergies.  6. Osteoarthritis.   PLAN:  At this point in time, she is placed non weightbearing in a Cam  walker.  She is getting physical therapy and occupational therapy  consults in the hospital.  She will need a walker with wheels for home  use as well as 3 in 1 commode seat for home use.  She will need home  health physical therapy and occupational therapy for ADLs and safety  eval.  We  will follow her both as an inpatient and outpatient.      Kirstin Shepperson, P.A.      Eulas Post, MD  Electronically Signed    KS/MEDQ  D:  12/22/2008  T:  12/23/2008  Job:  [id number]

## 2011-05-08 NOTE — Letter (Signed)
January 20, 2009   Dwana Curd. Para March, MD  [address]  [place], [place] [address]   Re:  Dana [name], Dana [name]                DOB:  08/15/1935   Dear Dr. Para March:   I appreciate the opportunity of seeing the patient in our  Multidisciplinary Thoracic Oncology Clinic.  This is a 76 year old  Caucasian female who quit smoking in 1995, and has been great as far as  her health maintenance and exercise.  Unfortunately, in December of this  year, she had an injury to her right tibia and fibula and in the workup,  was found to have a right middle lobe mass.  A CT scan showed a right  middle lobe mass and mediastinal adenopathy.  A PET scan revealed a 2.4  cm lesion.  The PET scan was done, which was positive in this area with  an SUV of 6.4.  There was some mild uptake and it did not specify the  amount of uptake in the right paratracheal node. It was also of interest  that there was some question of uptake and a node in the left axilla.  She has had yearly mammograms.  She has had no hemoptysis, fevers,  chills, or excessive sputum.  Screening pulmonary function test shows an  FVC of 2.94 with an FEV1 of 2.20, which is 89% of predicted.   PAST MEDICAL HISTORY:  Significant for hypercholesterolemia and chronic  obstructive pulmonary disease.   MEDICATIONS:  1. Prilosec.  2. Spiriva 18 mcg a day.  3. Loratadine 10 mg daily.  4. Vitamins.  5. Omega 3.  6. __________  7. __________  8. Wellbutrin 250 mg.   ALLERGIES:  She has no allergies.   FAMILY HISTORY:  Positive for cancer.   SOCIAL HISTORY:  She is married.  She has 3 children.  One of whom is  Dr. Noe Gens who is an internist in [place], [place].  She does not  smoke and quit smoking in 1985.  She has may be a glass of wine per day.   REVIEW OF SYSTEMS:  Vital Signs:  She is 145 pounds and she is 5 feet 6  inches.  General:  Her weight has been stable.  Cardiac:  No angina or  atrial fibrillation.  She does have shortness  of breath with exertion.  Pulmonary:  No hemoptysis.  No asthma or wheezing.  GI:  She has reflux.  GU:  No kidney disease, dysuria, or frequent urination.  Vascular:  No  claudication, DVT, or TIAs.  Neurologic:  No dizziness, headaches,  blackouts, or seizures.  Musculoskeletal:  She has arthritis.  Psychiatric:  No psychiatric illnesses.  Eyes/ENT:  No changes in her  eyesight or hearing.  Hematologic:  No problems with bleeding, clotting  disorders, or anemia.   PHYSICAL EXAMINATION:  VITAL SIGNS:  Her blood pressure was 138/74,  pulse 77, respirations 18, and sats were 95%.  HEAD, EYES, EARS, NOSE, AND THROAT:  Unremarkable.  NECK:  Supple without thyromegaly.  There is supraclavicular or axillary  adenopathy.  CHEST:  Clear to auscultation and percussion.  HEART:  Regular sinus rhythm.  No murmurs.  ABDOMEN:  Soft.  No hepatosplenomegaly.  EXTREMITY:  Pulses are 2+.  There is no clubbing or edema.  NEUROLOGIC:  She is oriented x3.  Sensory and motor intact.   I have discussed the situation with the patient and her husband.  My  recommendations are that she have a bronchoscopy and a mediastinoscopy  for staging purposes.  We will also [facility] ultrasound of the left breast and the lymph nodes to see if any of  those need to be biopsied.  I feel that she has a good chance that she  has a possible IIIA nonsmall cell lung cancer, if that is the case then  she would be need to be referred for chemotherapy and possibly radiation  prior to proceeding with surgery.  If her mediastinoscopy is negative  and her breast workup is negative, then a VATS lobectomy would be the  procedure of choice.  I appreciate the opportunity of seeing the  patient.   Sincerely,   Ines Bloomer, M.D.  Electronically Signed   DPB/MEDQ  D:  01/20/2009  T:  01/21/2009  Job:  [id number]   cc:   Pam Drown, M.D.  Theone Murdoch, M.D.

## 2011-05-09 ENCOUNTER — Ambulatory Visit (HOSPITAL_COMMUNITY)
Admission: RE | Admit: 2011-05-09 | Discharge: 2011-05-09 | Disposition: A | Discharge status: Home / Self Care | Payer: MEDICARE | Source: Ambulatory Visit | Attending: Internal Medicine | Admitting: Internal Medicine

## 2011-05-09 DIAGNOSIS — J984 Other disorders of lung: Secondary | ICD-10-CM | POA: Insufficient documentation

## 2011-05-09 DIAGNOSIS — R0602 Shortness of breath: Secondary | ICD-10-CM | POA: Insufficient documentation

## 2011-05-09 DIAGNOSIS — J90 Pleural effusion, not elsewhere classified: Secondary | ICD-10-CM | POA: Insufficient documentation

## 2011-05-09 DIAGNOSIS — R05 Cough: Secondary | ICD-10-CM | POA: Insufficient documentation

## 2011-05-09 DIAGNOSIS — C3490 Malignant neoplasm of unspecified part of unspecified bronchus or lung: Secondary | ICD-10-CM | POA: Insufficient documentation

## 2011-05-09 DIAGNOSIS — R059 Cough, unspecified: Secondary | ICD-10-CM | POA: Insufficient documentation

## 2011-05-09 DIAGNOSIS — I319 Disease of pericardium, unspecified: Secondary | ICD-10-CM | POA: Insufficient documentation

## 2011-05-09 MED ORDER — IOHEXOL 300 MG/ML  SOLN
80.0000 mL | Freq: Once | INTRAMUSCULAR | Status: AC | PRN
  Administered 2011-05-09: 80 mL via INTRAVENOUS

## 2011-05-17 ENCOUNTER — Other Ambulatory Visit: Payer: Self-pay | Admitting: Internal Medicine

## 2011-05-17 ENCOUNTER — Encounter (HOSPITAL_BASED_OUTPATIENT_CLINIC_OR_DEPARTMENT_OTHER): Payer: Medicare Other | Admitting: Internal Medicine

## 2011-05-17 DIAGNOSIS — C342 Malignant neoplasm of middle lobe, bronchus or lung: Secondary | ICD-10-CM

## 2011-05-17 DIAGNOSIS — R51 Headache: Secondary | ICD-10-CM

## 2011-05-17 LAB — COMPREHENSIVE METABOLIC PANEL
Albumin: 3.7 g/dL (ref 3.5–5.2)
CO2: 22 mEq/L (ref 19–32)
Glucose, Bld: 76 mg/dL (ref 70–99)
Potassium: 3.4 mEq/L — ABNORMAL LOW (ref 3.5–5.3)
Sodium: 138 mEq/L (ref 135–145)
Total Bilirubin: 0.8 mg/dL (ref 0.3–1.2)
Total Protein: 5.7 g/dL — ABNORMAL LOW (ref 6.0–8.3)

## 2011-05-17 LAB — CBC WITH DIFFERENTIAL/PLATELET
Eosinophils Absolute: 0.3 10*3/uL (ref 0.0–0.5)
HCT: 36.9 % (ref 34.8–46.6)
LYMPH%: 10.9 % — ABNORMAL LOW (ref 14.0–49.7)
MONO#: 0.4 10*3/uL (ref 0.1–0.9)
NEUT#: 3.8 10*3/uL (ref 1.5–6.5)
Platelets: 281 10*3/uL (ref 145–400)
RBC: 3.99 10*6/uL (ref 3.70–5.45)
WBC: 5.1 10*3/uL (ref 3.9–10.3)

## 2011-06-21 ENCOUNTER — Encounter (HOSPITAL_BASED_OUTPATIENT_CLINIC_OR_DEPARTMENT_OTHER): Payer: Medicare Other | Admitting: Internal Medicine

## 2011-06-21 ENCOUNTER — Other Ambulatory Visit: Payer: Self-pay | Admitting: Internal Medicine

## 2011-06-21 DIAGNOSIS — C3490 Malignant neoplasm of unspecified part of unspecified bronchus or lung: Secondary | ICD-10-CM

## 2011-06-21 DIAGNOSIS — C342 Malignant neoplasm of middle lobe, bronchus or lung: Secondary | ICD-10-CM

## 2011-06-21 DIAGNOSIS — R51 Headache: Secondary | ICD-10-CM

## 2011-06-21 DIAGNOSIS — R5383 Other fatigue: Secondary | ICD-10-CM

## 2011-06-21 LAB — CBC WITH DIFFERENTIAL/PLATELET
EOS%: 4.7 % (ref 0.0–7.0)
Eosinophils Absolute: 0.3 10*3/uL (ref 0.0–0.5)
MCV: 92.6 fL (ref 79.5–101.0)
MONO%: 6.6 % (ref 0.0–14.0)
NEUT#: 5.6 10*3/uL (ref 1.5–6.5)
RBC: 3.78 10*6/uL (ref 3.70–5.45)
RDW: 14.9 % — ABNORMAL HIGH (ref 11.2–14.5)

## 2011-06-21 LAB — COMPREHENSIVE METABOLIC PANEL
ALT: 20 U/L (ref 0–35)
AST: 22 U/L (ref 0–37)
Albumin: 3.4 g/dL — ABNORMAL LOW (ref 3.5–5.2)
Alkaline Phosphatase: 53 U/L (ref 39–117)
Glucose, Bld: 90 mg/dL (ref 70–99)
Potassium: 3.6 mEq/L (ref 3.5–5.3)
Sodium: 144 mEq/L (ref 135–145)
Total Protein: 5.4 g/dL — ABNORMAL LOW (ref 6.0–8.3)

## 2011-07-06 ENCOUNTER — Encounter (INDEPENDENT_AMBULATORY_CARE_PROVIDER_SITE_OTHER): Payer: Self-pay | Admitting: General Surgery

## 2011-07-06 ENCOUNTER — Ambulatory Visit (INDEPENDENT_AMBULATORY_CARE_PROVIDER_SITE_OTHER): Payer: Medicare Other | Admitting: General Surgery

## 2011-07-06 VITALS — BP 116/78 | HR 80 | Temp 97.0°F | Ht 63.5 in | Wt 124.6 lb

## 2011-07-06 DIAGNOSIS — K601 Chronic anal fissure: Secondary | ICD-10-CM

## 2011-07-06 DIAGNOSIS — C3490 Malignant neoplasm of unspecified part of unspecified bronchus or lung: Secondary | ICD-10-CM

## 2011-07-06 DIAGNOSIS — K602 Anal fissure, unspecified: Secondary | ICD-10-CM

## 2011-07-06 NOTE — Patient Instructions (Signed)
Please continue taking this Metamucil stool softener as presently doing. We will schedule used for an examination under anesthesia and internal sphincterotomy and plan to excise this a sentinel tag with general anesthesia. He will not there was dry after surgery but should there be drive and one or 2 days

## 2011-07-06 NOTE — Progress Notes (Signed)
Subjective:     Patient ID: Dana [name], female   DOB: [birth year], 76 y.o.   MRN: [id number]    BP 116/78  Pulse 80  Temp(Src) 97 F (36.1 C) (Temporal)  Ht 5' 3.5" (1.613 m)  Wt 124 lb 9.6 oz (56.518 kg)  BMI 21.73 kg/m2    HPI Next a note on Dana [name].  Dana [name] is I. patient who I first saw in May at which time it was questioned whether she had a anterior fissure or possibly a hemorrhoidal tag. 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 it's a little bit more to the right of midline and will need an internal sphincterotomy  Her past medical history is that she's got active lung cancer in remission she is on chemotherapy and is seeing the oncologist in approximately 2 weeks she said during this treatment of lung cancer she sat or bladder prolapse and tolerated surgery without problems in that she's not immunosuppressed as much as she is aware of  She is active on physical activity as married and her husband will come with her and she understands if we do the procedure at [facility] with general anesthesia her she will not be able to drive that day.   Review of Systems Past Medical History  Diagnosis Date  . Cancer     lung  History reviewed. No pertinent past surgical history.ROS    Objective:   Physical Exam  Constitutional: She appears well-nourished.  HENT:  Head: Normocephalic.    Neck: Normal range of motion. Neck supple.  Pulmonary/Chest: Breath sounds normal.  Abdominal: Soft. Bowel sounds are normal.  Musculoskeletal: Normal range of motion.  On anal exam this she has a fissure anteriorly slightly to the right with exposed sphincter fibers she resists a rectal examination and I pretreated with topical lidocaine but she still resists and I did not actually do a speculum exam and I cannot see any active infection but she will need a internal sphincterotomy and excision of this little anal tag that is located to the right anterior.     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 Her last chest x-ray was approximately 3 weeks ago and showed no active disease as she does have right stage III lung cancer and treatment at this time. She's regulate her stool for Metamucil which she will continue but may need a fleets enema prior to the procedure which she can do at time    Plan:        I reviewed the little booklet on sphincter fissure surgery with her and she has a copy of that booklet

## 2011-07-09 HISTORY — PX: ANAL FISSURE REPAIR: SHX2312

## 2011-07-10 LAB — DIFFERENTIAL
Basophils Absolute: 0 10*3/uL (ref 0.0–0.1)
Lymphocytes Relative: 13 % (ref 12–46)
Monocytes Absolute: 0.7 10*3/uL (ref 0.1–1.0)
Neutro Abs: 5 10*3/uL (ref 1.7–7.7)

## 2011-07-10 LAB — BASIC METABOLIC PANEL
BUN: 22 mg/dL (ref 6–23)
Chloride: 106 mEq/L (ref 96–112)
Creatinine, Ser: 0.69 mg/dL (ref 0.50–1.10)
GFR calc Af Amer: >60 mL/min (ref >60)
GFR calc non Af Amer: >60 mL/min (ref >60)

## 2011-07-10 LAB — CBC
HCT: 37.1 % (ref 36.0–46.0)
Hemoglobin: 12.8 g/dL (ref 12.0–15.0)
MCHC: 34.5 g/dL (ref 30.0–36.0)
RBC: 4.11 MIL/uL (ref 3.87–5.11)
WBC: 7.1 10*3/uL (ref 4.0–10.5)

## 2011-07-12 ENCOUNTER — Ambulatory Visit (HOSPITAL_BASED_OUTPATIENT_CLINIC_OR_DEPARTMENT_OTHER)
Admission: RE | Admit: 2011-07-12 | Discharge: 2011-07-12 | Disposition: A | Discharge status: Home / Self Care | Payer: Medicare Other | Source: Ambulatory Visit | Attending: General Surgery | Admitting: General Surgery

## 2011-07-12 DIAGNOSIS — K602 Anal fissure, unspecified: Secondary | ICD-10-CM

## 2011-07-12 DIAGNOSIS — C3490 Malignant neoplasm of unspecified part of unspecified bronchus or lung: Secondary | ICD-10-CM | POA: Insufficient documentation

## 2011-07-12 DIAGNOSIS — Z79899 Other long term (current) drug therapy: Secondary | ICD-10-CM | POA: Insufficient documentation

## 2011-07-12 DIAGNOSIS — Z01812 Encounter for preprocedural laboratory examination: Secondary | ICD-10-CM | POA: Insufficient documentation

## 2011-07-12 DIAGNOSIS — J449 Chronic obstructive pulmonary disease, unspecified: Secondary | ICD-10-CM | POA: Insufficient documentation

## 2011-07-12 DIAGNOSIS — J4489 Other specified chronic obstructive pulmonary disease: Secondary | ICD-10-CM | POA: Insufficient documentation

## 2011-07-19 ENCOUNTER — Encounter (HOSPITAL_BASED_OUTPATIENT_CLINIC_OR_DEPARTMENT_OTHER): Payer: Medicare Other | Admitting: Internal Medicine

## 2011-07-19 ENCOUNTER — Other Ambulatory Visit: Payer: Self-pay | Admitting: Internal Medicine

## 2011-07-19 DIAGNOSIS — C3490 Malignant neoplasm of unspecified part of unspecified bronchus or lung: Secondary | ICD-10-CM

## 2011-07-19 DIAGNOSIS — C342 Malignant neoplasm of middle lobe, bronchus or lung: Secondary | ICD-10-CM

## 2011-07-19 DIAGNOSIS — R51 Headache: Secondary | ICD-10-CM

## 2011-07-19 LAB — COMPREHENSIVE METABOLIC PANEL
ALT: 14 U/L (ref 0–35)
AST: 17 U/L (ref 0–37)
Alkaline Phosphatase: 48 U/L (ref 39–117)
Chloride: 106 mEq/L (ref 96–112)
Creatinine, Ser: 0.79 mg/dL (ref 0.50–1.10)
Total Bilirubin: 0.8 mg/dL (ref 0.3–1.2)

## 2011-07-19 LAB — CBC WITH DIFFERENTIAL/PLATELET
BASO%: 0 % (ref 0.0–2.0)
EOS%: 7 % (ref 0.0–7.0)
HCT: 35.4 % (ref 34.8–46.6)
LYMPH%: 11.2 % — ABNORMAL LOW (ref 14.0–49.7)
MCH: 31.9 pg (ref 25.1–34.0)
MCHC: 34 g/dL (ref 31.5–36.0)
MCV: 93.7 fL (ref 79.5–101.0)
MONO%: 9.5 % (ref 0.0–14.0)
NEUT%: 72.3 % (ref 38.4–76.8)
lymph#: 0.6 10*3/uL — ABNORMAL LOW (ref 0.9–3.3)

## 2011-07-24 NOTE — Op Note (Signed)
[name], [name]                ACCOUNT NO.:  [id number]  MEDICAL RECORD NO.:  [id number]  LOCATION:                                 FACILITY:  PHYSICIAN:  Anselm Pancoast. Marqui Formby, M.D.DATE OF BIRTH:  June 27, 1935  DATE OF PROCEDURE:  07/12/2011 DATE OF DISCHARGE:                              OPERATIVE REPORT   PREOPERATIVE DIAGNOSIS:  Recurrent anal fissure, history of lung cancer on present chemotherapy.  OPERATION:  Examination under anesthesia; internal sphincterotomy, left lateral, and excision of hemorrhoidal tag.  ANESTHESIA:  General anesthesia, in lithotomy position.  HISTORY:  Dana [name] is a 76 year old female who was referred to me approximately 2 months ago with symptoms of anal pain with swelling and on examination it looked like she had an anterior fissure.  She has a history of stage 3 lung cancer and is on medications for control of this.  It really aggravates her bowel function.  She has had a probable C diff approximately 3 to 4 months ago and usually it is issue of chronic constipation.  I saw her back in the office approximately 3 weeks later and she was better and looked like there was just a hemorrhoidal tag that was present then but she had personal plans and we did not do anything at that visit.  I saw her recently when she hoped that I could just excise the area but she obviously has an anterior left fissure that is I am sure giving the actual symptoms and I recommended that we do in the operating room with examination, internal sphincterotomy and just kind of excision of the hemorrhoidal redundant tissue in that immediate area only.  She is here for the planned procedure.  Her lab studies are normal.  She had taken laxatives to hopefully clean out her rectum.  Induction of general anesthesia, she received 1 g of Ancef and  in this position she has got more hemorrhoids today than when I saw her in the office but I prepped her with Betadine surgical  scrub solution after she was up in the yellow fin stirrups and you could see the anterior fissure to the left lateral up anterior.  She has got little bit of redundant hemorrhoids, minimal posterior and looks like probably kind of acute, probably more prepping on posterior, not really a chronic posterior fissure.  I used the cautery to excise the little area of skin lateral and then used a hemostat to get up under this internal sphincter in the left lateral anterior position and then divided the sphincter with cautery.  The bleeding was minimal.  I had not anesthetized her since I wanted to make sure I had an adequate internal sphincterotomy and then I used a 2-0 chromic at the apex of the hemorrhoid overlying this area, trimmed some of the hemorrhoid off, put a figure-of-eight anteriorly and then running locking 2-0 chromic on the area where I had opened up to do the sphincterotomy.  She had an LMA tube and we were letting it go and get light and it appears that I got an adequate internal sphincterotomy. I then anesthetized with about 10 cc of 1% Marcaine with adrenaline. She had  hematoma posterior on the left side and I made a little incision and just lanced that hemorrhoid to evacuate the hematoma and then put a couple of simple 3-0 chromic sutures in that little second incision, which was at the typical location upon internal sphincterotomy.  The procedure was tolerated nicely and I put 5% lidocaine ointment on gauze and kind of placed on the anal canal, stretch panties and she will be released after a short stay.  I will see her back in the office in approximately 1 to 2 weeks, Vicodin for pain and Xylocaine ointment, soaking in the sitz bath and continue regulate her stools as she has been doing chronically.  She will continue on her medications for the cancer of the lungs stage 3 on right.     Anselm Pancoast. Zachery Dakins, M.D.     WJW/MEDQ  D:  07/12/2011  T:  07/12/2011  Job:   [id number]  cc:   Pam Drown, M.D. Fax: [phone]  Electronically Signed by Consuello Bossier M.D. on 07/24/2011 02:52:07 PM

## 2011-08-02 ENCOUNTER — Encounter (INDEPENDENT_AMBULATORY_CARE_PROVIDER_SITE_OTHER): Payer: Self-pay | Admitting: General Surgery

## 2011-08-02 ENCOUNTER — Ambulatory Visit (INDEPENDENT_AMBULATORY_CARE_PROVIDER_SITE_OTHER): Payer: Medicare Other | Admitting: General Surgery

## 2011-08-02 VITALS — BP 120/80 | HR 70 | Temp 96.8°F

## 2011-08-02 DIAGNOSIS — C3490 Malignant neoplasm of unspecified part of unspecified bronchus or lung: Secondary | ICD-10-CM

## 2011-08-02 DIAGNOSIS — K602 Anal fissure, unspecified: Secondary | ICD-10-CM

## 2011-08-02 NOTE — Progress Notes (Signed)
Subjective:     Patient ID: Dana [name], female   DOB: 07-Oct-1935, 76 y.o.   MRN: [id number]  HPIPatient is brought to 6 weeks following an anterior fistulotomy and excision of an external hemorrhoidal tag that occurred when she is having problems with chemotherapy for her active lung cancer she is improving with less pain but not completely asymptomatic at this time  Review of Systems     Objective:   Physical ExamPatient has a clean healing wound anteriorly through the anterior sphincterotomy and excision of external hemorrhoidal tag was performed she is not having chronic anal spasm in the mole but the fissure is not completely healed it is greatly improved over the last few weeks however     Assessment:    Patient is improving following her surgery for a anterior anal fissure and a swollen external hemorrhoid     Plan:     Continue with keeping the area clean and dry and to regulate her stools so they are soft never a hard stool see me for followup postop visit in approximately 6 weeks

## 2011-08-09 ENCOUNTER — Ambulatory Visit (HOSPITAL_COMMUNITY)
Admission: RE | Admit: 2011-08-09 | Discharge: 2011-08-09 | Disposition: A | Discharge status: Home / Self Care | Payer: Medicare Other | Source: Ambulatory Visit | Attending: Internal Medicine | Admitting: Internal Medicine

## 2011-08-09 ENCOUNTER — Other Ambulatory Visit (HOSPITAL_COMMUNITY): Payer: Medicare Other

## 2011-08-09 DIAGNOSIS — R0602 Shortness of breath: Secondary | ICD-10-CM | POA: Insufficient documentation

## 2011-08-09 DIAGNOSIS — I319 Disease of pericardium, unspecified: Secondary | ICD-10-CM | POA: Insufficient documentation

## 2011-08-09 DIAGNOSIS — J90 Pleural effusion, not elsewhere classified: Secondary | ICD-10-CM | POA: Insufficient documentation

## 2011-08-09 DIAGNOSIS — R05 Cough: Secondary | ICD-10-CM | POA: Insufficient documentation

## 2011-08-09 DIAGNOSIS — J9819 Other pulmonary collapse: Secondary | ICD-10-CM | POA: Insufficient documentation

## 2011-08-09 DIAGNOSIS — Z923 Personal history of irradiation: Secondary | ICD-10-CM | POA: Insufficient documentation

## 2011-08-09 DIAGNOSIS — C3490 Malignant neoplasm of unspecified part of unspecified bronchus or lung: Secondary | ICD-10-CM

## 2011-08-09 DIAGNOSIS — Z9221 Personal history of antineoplastic chemotherapy: Secondary | ICD-10-CM | POA: Insufficient documentation

## 2011-08-09 DIAGNOSIS — K8020 Calculus of gallbladder without cholecystitis without obstruction: Secondary | ICD-10-CM | POA: Insufficient documentation

## 2011-08-09 DIAGNOSIS — R059 Cough, unspecified: Secondary | ICD-10-CM | POA: Insufficient documentation

## 2011-08-09 DIAGNOSIS — N281 Cyst of kidney, acquired: Secondary | ICD-10-CM | POA: Insufficient documentation

## 2011-08-09 MED ORDER — IOHEXOL 300 MG/ML  SOLN
80.0000 mL | Freq: Once | INTRAMUSCULAR | Status: AC | PRN
  Administered 2011-08-09: 80 mL via INTRAVENOUS

## 2011-08-15 ENCOUNTER — Encounter (HOSPITAL_BASED_OUTPATIENT_CLINIC_OR_DEPARTMENT_OTHER): Payer: Medicare Other | Admitting: Internal Medicine

## 2011-08-15 ENCOUNTER — Other Ambulatory Visit: Payer: Self-pay | Admitting: Internal Medicine

## 2011-08-15 DIAGNOSIS — C342 Malignant neoplasm of middle lobe, bronchus or lung: Secondary | ICD-10-CM

## 2011-08-15 DIAGNOSIS — C3490 Malignant neoplasm of unspecified part of unspecified bronchus or lung: Secondary | ICD-10-CM

## 2011-08-15 DIAGNOSIS — R51 Headache: Secondary | ICD-10-CM

## 2011-08-15 LAB — CBC WITH DIFFERENTIAL/PLATELET
BASO%: 0.1 % (ref 0.0–2.0)
Basophils Absolute: 0 10*3/uL (ref 0.0–0.1)
EOS%: 6 % (ref 0.0–7.0)
HCT: 35.2 % (ref 34.8–46.6)
HGB: 12.2 g/dL (ref 11.6–15.9)
LYMPH%: 13.9 % — ABNORMAL LOW (ref 14.0–49.7)
MCH: 32.1 pg (ref 25.1–34.0)
MCHC: 34.7 g/dL (ref 31.5–36.0)
MCV: 92.7 fL (ref 79.5–101.0)
NEUT%: 71.8 % (ref 38.4–76.8)
Platelets: 268 10*3/uL (ref 145–400)

## 2011-08-15 LAB — COMPREHENSIVE METABOLIC PANEL
ALT: 19 U/L (ref 0–35)
AST: 22 U/L (ref 0–37)
BUN: 19 mg/dL (ref 6–23)
Calcium: 8.7 mg/dL (ref 8.4–10.5)
Creatinine, Ser: 0.74 mg/dL (ref 0.50–1.10)
Total Bilirubin: 0.6 mg/dL (ref 0.3–1.2)

## 2011-09-12 ENCOUNTER — Encounter (INDEPENDENT_AMBULATORY_CARE_PROVIDER_SITE_OTHER): Payer: Self-pay | Admitting: General Surgery

## 2011-09-13 ENCOUNTER — Other Ambulatory Visit: Payer: Self-pay | Admitting: Internal Medicine

## 2011-09-13 ENCOUNTER — Encounter (INDEPENDENT_AMBULATORY_CARE_PROVIDER_SITE_OTHER): Payer: Self-pay | Admitting: General Surgery

## 2011-09-13 ENCOUNTER — Encounter (HOSPITAL_BASED_OUTPATIENT_CLINIC_OR_DEPARTMENT_OTHER): Payer: Medicare Other | Admitting: Internal Medicine

## 2011-09-13 DIAGNOSIS — R51 Headache: Secondary | ICD-10-CM

## 2011-09-13 DIAGNOSIS — C342 Malignant neoplasm of middle lobe, bronchus or lung: Secondary | ICD-10-CM

## 2011-09-13 LAB — COMPREHENSIVE METABOLIC PANEL
ALT: 20 U/L (ref 0–35)
Albumin: 4 g/dL (ref 3.5–5.2)
CO2: 28 mEq/L (ref 19–32)
Calcium: 9.1 mg/dL (ref 8.4–10.5)
Chloride: 105 mEq/L (ref 96–112)
Glucose, Bld: 78 mg/dL (ref 70–99)
Potassium: 4.4 mEq/L (ref 3.5–5.3)
Sodium: 142 mEq/L (ref 135–145)
Total Protein: 6.2 g/dL (ref 6.0–8.3)

## 2011-09-13 LAB — CBC WITH DIFFERENTIAL/PLATELET
BASO%: 0.3 % (ref 0.0–2.0)
Eosinophils Absolute: 0.4 10*3/uL (ref 0.0–0.5)
LYMPH%: 10.6 % — ABNORMAL LOW (ref 14.0–49.7)
MONO#: 0.5 10*3/uL (ref 0.1–0.9)
NEUT#: 3.6 10*3/uL (ref 1.5–6.5)
Platelets: 273 10*3/uL (ref 145–400)
RBC: 3.94 10*6/uL (ref 3.70–5.45)
WBC: 5.1 10*3/uL (ref 3.9–10.3)
lymph#: 0.5 10*3/uL — ABNORMAL LOW (ref 0.9–3.3)

## 2011-09-14 ENCOUNTER — Ambulatory Visit (INDEPENDENT_AMBULATORY_CARE_PROVIDER_SITE_OTHER): Payer: Medicare Other | Admitting: General Surgery

## 2011-09-14 ENCOUNTER — Encounter (INDEPENDENT_AMBULATORY_CARE_PROVIDER_SITE_OTHER): Payer: Self-pay | Admitting: General Surgery

## 2011-09-14 VITALS — BP 120/80 | HR 66 | Temp 97.4°F | Resp 12 | Ht 64.0 in | Wt 129.8 lb

## 2011-09-14 DIAGNOSIS — K602 Anal fissure, unspecified: Secondary | ICD-10-CM

## 2011-09-14 NOTE — Patient Instructions (Signed)
Try to regulate her stool so that he never had a hard bowel movement and I expect MiraLax would be better than your stool softner. You could take a full dose every other day possibly a half dose daily to see [place] on a p.r.n. basis if problems occur

## 2011-09-14 NOTE — Progress Notes (Signed)
Subjective:     Patient ID: Dana [name], female   DOB: Jan 30, 1935, 76 y.o.   MRN: [id number]  HPIThe patient returns and so she is no longer had any anal pain or swelling with bowel movements and she recently saw her medical oncologist and says that her right lung cancer is stable. She says some time she still having hard bowel movements and is taking Metamucil I think that MiraLax would be a better choice of full dose every other day or half dose on daily basis   Review of Systems     Objective:   Physical Exam   BP 120/80  Pulse 66  Temp(Src) 97.4 F (36.3 C) (Temporal)  Resp 12  Ht 5\' 4"  (1.626 m)  Wt 129 lb 12.8 oz (58.877 kg)  BMI 22.28 kg/m2 Rectal exam shows no spasm there is no swelling or problems and anterior quadrant where she's had a chronic external hemorrhoid and fissure normal rectal examination digital there's no spasm or rales areas. She needs to regulate her stools follow better otherwise everything looks fine and see [place] on a p.r.n. basis Assessment:         Plan:

## 2011-09-28 LAB — COMPREHENSIVE METABOLIC PANEL
ALT: 13 U/L (ref 0–35)
AST: 18 U/L (ref 0–37)
Albumin: 3 g/dL — ABNORMAL LOW (ref 3.5–5.2)
Albumin: 3.5 g/dL (ref 3.5–5.2)
BUN: 14 mg/dL (ref 6–23)
Calcium: 9.5 mg/dL (ref 8.4–10.5)
Chloride: 107 mEq/L (ref 96–112)
Creatinine, Ser: 0.54 mg/dL (ref 0.4–1.2)
Creatinine, Ser: 0.69 mg/dL (ref 0.4–1.2)
GFR calc Af Amer: >60 mL/min (ref >60)
Glucose, Bld: 146 mg/dL — ABNORMAL HIGH (ref 70–99)
Sodium: 137 mEq/L (ref 135–145)
Total Bilirubin: 0.8 mg/dL (ref 0.3–1.2)
Total Protein: 5.2 g/dL — ABNORMAL LOW (ref 6.0–8.3)
Total Protein: 5.6 g/dL — ABNORMAL LOW (ref 6.0–8.3)

## 2011-09-28 LAB — CBC
HCT: 37.3 % (ref 36.0–46.0)
MCHC: 33 g/dL (ref 30.0–36.0)
MCV: 89.7 fL (ref 78.0–100.0)
MCV: 90 fL (ref 78.0–100.0)
Platelets: 232 10*3/uL (ref 150–400)
Platelets: 235 10*3/uL (ref 150–400)
RBC: 4.1 MIL/uL (ref 3.87–5.11)
RDW: 14 % (ref 11.5–15.5)
RDW: 14.6 % (ref 11.5–15.5)

## 2011-09-28 LAB — CARDIAC PANEL(CRET KIN+CKTOT+MB+TROPI)
CK, MB: 2.4 ng/mL (ref 0.3–4.0)
Relative Index: INVALID (ref 0.0–2.5)
Total CK: 81 U/L (ref 7–177)
Troponin I: 0.01 ng/mL (ref 0.00–0.06)
Troponin I: 0.01 ng/mL (ref 0.00–0.06)

## 2011-09-28 LAB — URINALYSIS, ROUTINE W REFLEX MICROSCOPIC
Bilirubin Urine: NEGATIVE
Glucose, UA: NEGATIVE mg/dL
Hgb urine dipstick: NEGATIVE
Specific Gravity, Urine: 1.011 (ref 1.005–1.030)
Urobilinogen, UA: 0.2 mg/dL (ref 0.0–1.0)

## 2011-09-28 LAB — URINE MICROSCOPIC-ADD ON

## 2011-09-28 LAB — PROTIME-INR
INR: 1 (ref 0.00–1.49)
Prothrombin Time: 13.7 seconds (ref 11.6–15.2)

## 2011-09-28 LAB — LIPID PANEL
Cholesterol: 225 mg/dL — ABNORMAL HIGH (ref 0–200)
HDL: 64 mg/dL (ref >39)
LDL Cholesterol: 142 mg/dL — ABNORMAL HIGH (ref 0–99)
Total CHOL/HDL Ratio: 3.5 RATIO

## 2011-09-28 LAB — APTT: aPTT: 24 seconds (ref 24–37)

## 2011-09-28 LAB — URINE CULTURE

## 2011-09-28 LAB — TSH: TSH: 2.332 u[IU]/mL (ref 0.350–4.500)

## 2011-10-01 LAB — DIFFERENTIAL
Basophils Absolute: 0.1
Basophils Relative: 1
Eosinophils Absolute: 0.2
Eosinophils Relative: 3
Lymphocytes Relative: 26
Lymphs Abs: 1.5
Monocytes Absolute: 0.4
Monocytes Relative: 8
Neutro Abs: 3.5
Neutrophils Relative %: 62

## 2011-10-01 LAB — I-STAT 8, (EC8 V) (CONVERTED LAB)
Acid-Base Excess: 1
BUN: 17
Bicarbonate: 22.8
Chloride: 110
Glucose, Bld: 95
HCT: 42
Hemoglobin: 14.3
Operator id: 285491
Potassium: 3.6
Sodium: 140
TCO2: 24
pCO2, Ven: 27.6 — ABNORMAL LOW
pH, Ven: 7.525 — ABNORMAL HIGH

## 2011-10-01 LAB — CBC
HCT: 38.1
MCV: 91.2
Platelets: 290
RBC: 4.18
WBC: 5.7

## 2011-10-01 LAB — POCT I-STAT CREATININE
Creatinine, Ser: 0.8
Operator id: 285491

## 2011-10-01 LAB — POCT CARDIAC MARKERS
CKMB, poc: 2.5
Myoglobin, poc: 82.6
Operator id: 285491
Troponin i, poc: <0.05

## 2011-10-11 ENCOUNTER — Other Ambulatory Visit: Payer: Self-pay | Admitting: Internal Medicine

## 2011-10-11 ENCOUNTER — Encounter (HOSPITAL_BASED_OUTPATIENT_CLINIC_OR_DEPARTMENT_OTHER): Payer: Medicare Other | Admitting: Internal Medicine

## 2011-10-11 DIAGNOSIS — C3490 Malignant neoplasm of unspecified part of unspecified bronchus or lung: Secondary | ICD-10-CM

## 2011-10-11 DIAGNOSIS — R5381 Other malaise: Secondary | ICD-10-CM

## 2011-10-11 DIAGNOSIS — R51 Headache: Secondary | ICD-10-CM

## 2011-10-11 DIAGNOSIS — C342 Malignant neoplasm of middle lobe, bronchus or lung: Secondary | ICD-10-CM

## 2011-10-11 LAB — CBC WITH DIFFERENTIAL/PLATELET
Basophils Absolute: 0 10*3/uL (ref 0.0–0.1)
EOS%: 6.3 % (ref 0.0–7.0)
HCT: 36.8 % (ref 34.8–46.6)
HGB: 12.5 g/dL (ref 11.6–15.9)
LYMPH%: 8.8 % — ABNORMAL LOW (ref 14.0–49.7)
MCH: 31.6 pg (ref 25.1–34.0)
MCHC: 34 g/dL (ref 31.5–36.0)
MCV: 93.1 fL (ref 79.5–101.0)
MONO%: 7.6 % (ref 0.0–14.0)
NEUT%: 77.2 % — ABNORMAL HIGH (ref 38.4–76.8)
Platelets: 262 10*3/uL (ref 145–400)
lymph#: 0.5 10*3/uL — ABNORMAL LOW (ref 0.9–3.3)

## 2011-10-11 LAB — COMPREHENSIVE METABOLIC PANEL
AST: 25 U/L (ref 0–37)
BUN: 20 mg/dL (ref 6–23)
Calcium: 9.1 mg/dL (ref 8.4–10.5)
Chloride: 106 mEq/L (ref 96–112)
Creatinine, Ser: 0.69 mg/dL (ref 0.50–1.10)
Total Bilirubin: 0.7 mg/dL (ref 0.3–1.2)

## 2011-11-09 ENCOUNTER — Ambulatory Visit (HOSPITAL_COMMUNITY)
Admission: RE | Admit: 2011-11-09 | Discharge: 2011-11-09 | Disposition: A | Discharge status: Home / Self Care | Payer: Medicare Other | Source: Ambulatory Visit | Attending: Internal Medicine | Admitting: Internal Medicine

## 2011-11-09 ENCOUNTER — Other Ambulatory Visit: Payer: Self-pay | Admitting: Internal Medicine

## 2011-11-09 ENCOUNTER — Other Ambulatory Visit (HOSPITAL_COMMUNITY): Payer: Medicare Other

## 2011-11-09 ENCOUNTER — Encounter (HOSPITAL_COMMUNITY): Payer: Self-pay

## 2011-11-09 ENCOUNTER — Other Ambulatory Visit (HOSPITAL_BASED_OUTPATIENT_CLINIC_OR_DEPARTMENT_OTHER): Payer: Medicare Other | Admitting: Lab

## 2011-11-09 DIAGNOSIS — J90 Pleural effusion, not elsewhere classified: Secondary | ICD-10-CM | POA: Insufficient documentation

## 2011-11-09 DIAGNOSIS — C3490 Malignant neoplasm of unspecified part of unspecified bronchus or lung: Secondary | ICD-10-CM

## 2011-11-09 DIAGNOSIS — C342 Malignant neoplasm of middle lobe, bronchus or lung: Secondary | ICD-10-CM

## 2011-11-09 LAB — CBC WITH DIFFERENTIAL/PLATELET
Basophils Absolute: 0 10*3/uL (ref 0.0–0.1)
HCT: 35.7 % (ref 34.8–46.6)
HGB: 12 g/dL (ref 11.6–15.9)
MCH: 31.1 pg (ref 25.1–34.0)
MONO#: 0.5 10*3/uL (ref 0.1–0.9)
NEUT%: 72 % (ref 38.4–76.8)
Platelets: 233 10*3/uL (ref 145–400)
lymph#: 0.6 10*3/uL — ABNORMAL LOW (ref 0.9–3.3)

## 2011-11-09 LAB — CMP (CANCER CENTER ONLY)
BUN, Bld: 20 mg/dL (ref 7–22)
CO2: 28 mEq/L (ref 18–33)
Calcium: 8.7 mg/dL (ref 8.0–10.3)
Chloride: 104 mEq/L (ref 98–108)
Creat: 0.6 mg/dl (ref 0.6–1.2)

## 2011-11-09 MED ORDER — IOHEXOL 300 MG/ML  SOLN: 80.0000 mL | Freq: Once | INTRAMUSCULAR | Status: AC | PRN

## 2011-11-12 ENCOUNTER — Telehealth: Payer: Self-pay | Admitting: Internal Medicine

## 2011-11-12 ENCOUNTER — Ambulatory Visit (HOSPITAL_BASED_OUTPATIENT_CLINIC_OR_DEPARTMENT_OTHER): Payer: Medicare Other | Admitting: Internal Medicine

## 2011-11-12 VITALS — BP 135/79 | HR 91 | Temp 96.9°F | Ht 63.5 in | Wt 130.9 lb

## 2011-11-12 DIAGNOSIS — C3491 Malignant neoplasm of unspecified part of right bronchus or lung: Secondary | ICD-10-CM | POA: Insufficient documentation

## 2011-11-12 DIAGNOSIS — C3410 Malignant neoplasm of upper lobe, unspecified bronchus or lung: Secondary | ICD-10-CM

## 2011-11-12 DIAGNOSIS — C3490 Malignant neoplasm of unspecified part of unspecified bronchus or lung: Secondary | ICD-10-CM

## 2011-11-12 DIAGNOSIS — M7989 Other specified soft tissue disorders: Secondary | ICD-10-CM

## 2011-11-12 NOTE — Telephone Encounter (Signed)
gve the pt her dec 2012 appt calendar °

## 2011-11-12 NOTE — Progress Notes (Signed)
No images are attached to the encounter. No scans are attached to the encounter. No scans are attached to the encounter. [facility] OFFICE PROGRESS NOTE  [name],[name], MD, MD [address] [place] [address]  DIAGNOSIS: :  Stage IIIA nonsmall cell lung cancer, adenocarcinoma, in a never smoker patient with positive EGFR mutation diagnosed in January 2010.  PRIOR THERAPY: Status post concurrent chemoradiation with weekly carboplatin and paclitaxel; last dose given March 21, 2009.  CURRENT THERAPY: Tarceva at 150 mg p.o. daily, status post 32 months of treatment.  INTERVAL HISTORY: Dana [name] 76 y.o. female returns to clinic today accompanied by her husband for followup visit. The patient is feeling fine she denied having any significant complaints. She has no fever or chills, no headache, no blurry vision. She denied having any significant skin rash or diarrhea. In general she is tolerating her treatment was Tarceva fairly well. She has swelling in her right wrist, suspicious for a cyst. The patient is scheduled to see a hand surgeon for evaluation of this problem. She had repeat CT scan of the chest with contrast on 11/09/2011. She is here today for evaluation and discussion of her scan results.  MEDICAL HISTORY: Past Medical History  Diagnosis Date  . Hemorrhoid   . Cancer     lung    ALLERGIES:  is allergic to amoxicillin.  MEDICATIONS:  Current Outpatient Prescriptions  Medication Sig Dispense Refill  . calcium carbonate (OS-CAL) 600 MG TABS Take 600 mg by mouth 2 (two) times daily with a meal.        . conjugated estrogens (PREMARIN) vaginal cream Place vaginally once a week.        . Cyanocobalamin (B-12) 1000 MCG CAPS Take by mouth.        . erlotinib (TARCEVA) 150 MG tablet Take 150 mg by mouth daily. Take on an empty stomach 1 hour before meals or 2 hours after.       . loratadine (CLARITIN) 10 MG tablet Take 10 mg by mouth daily.          . Multiple Vitamin (MULTIVITAMIN) tablet Take 1 tablet by mouth daily.        . nabumetone (RELAFEN) 500 MG tablet Take 250 mg by mouth as needed.       Marland Kitchen omeprazole (PRILOSEC) 10 MG capsule Take 10 mg by mouth daily.        Marland Kitchen tiotropium (SPIRIVA) 18 MCG inhalation capsule Place 18 mcg into inhaler and inhale daily.        Marland Kitchen VITAMIN D, ERGOCALCIFEROL, PO Take 2,000 Units by mouth daily.          SURGICAL HISTORY:  Past Surgical History  Procedure Date  . Breast surgery 02/1999    left nipple discharge/ Central duct excision  . Anal fissure repair 07/09/2011    REVIEW OF SYSTEMS:  A comprehensive review of systems was negative.   PHYSICAL EXAMINATION: General appearance: alert, cooperative and no distress Head: Normocephalic, without obvious abnormality, atraumatic Neck: no adenopathy Lymph nodes: Cervical, supraclavicular, and axillary nodes normal. Resp: clear to auscultation bilaterally Cardio: regular rate and rhythm, S1, S2 normal, no murmur, click, rub or gallop GI: soft, non-tender; bowel sounds normal; no masses,  no organomegaly Extremities: extremities normal, atraumatic, no cyanosis or edema Neurologic: Alert and oriented X 3, normal strength and tone. Normal symmetric reflexes. Normal coordination and gait  ECOG PERFORMANCE STATUS: 0 - Asymptomatic  Blood pressure 135/79, pulse 91, temperature  96.9 F (36.1 C), height 5' 3.5" (1.613 m), weight 130 lb 14.4 oz (59.376 kg).  LABORATORY DATA: Lab Results  Component Value Date   WBC 4.9 11/09/2011   HGB 12.0 11/09/2011   HCT 35.7 11/09/2011   MCV 93.1 11/09/2011   PLT 233 11/09/2011      Chemistry      Component Value Date/Time   NA 145 11/09/2011 1345   NA 142 10/11/2011 0952   K 3.9 11/09/2011 1345   K 4.0 10/11/2011 0952   CL 104 11/09/2011 1345   CL 106 10/11/2011 0952   CO2 28 11/09/2011 1345   CO2 23 10/11/2011 0952   BUN 20 11/09/2011 1345   BUN 20 10/11/2011 0952   CREATININE 0.6 11/09/2011 1345    CREATININE 0.69 10/11/2011 0952      Component Value Date/Time   CALCIUM 8.7 11/09/2011 1345   CALCIUM 9.1 10/11/2011 0952   ALKPHOS 52 11/09/2011 1345   ALKPHOS 56 10/11/2011 0952   AST 20 11/09/2011 1345   AST 25 10/11/2011 0952   ALT 18 10/11/2011 0952   BILITOT 0.60 11/09/2011 1345   BILITOT 0.7 10/11/2011 0952       RADIOGRAPHIC STUDIES: Ct Chest W Contrast  11/09/2011  *RADIOLOGY REPORT*  Clinical Data: Lung cancer follow-up  CT CHEST WITH CONTRAST  Technique:  Multidetector CT imaging of the chest was performed following the standard protocol during bolus administration of intravenous contrast.  Contrast:  80 ml of omni 300  Comparison: 08/09/2011  Findings: There are no enlarged axillary or supraclavicular lymph nodes.  There are no pathologically enlarged mediastinal or hilar lymph nodes.  There is a moderate-sized right pleural effusion.  Not changed in volume from previous exam.  No left effusion.  No pericardial effusion.  Stable nodule in the left base measuring 4.4 mm, image 41. Tiny nodule in the right upper lobe measures 3.3 mm, image 14.  Stable from previous exam.  There are no specific features to suggest residual or recurrence of tumor.  IMPRESSION:  1.  Stable CT of the chest. 2.  No significant change in right pleural effusion.  Original Report Authenticated By: Rosealee Albee, M.D.    ASSESSMENT: This is a very pleasant 76 year old white female with a history of stage III nonsmall cell lung cancer with positive EGFR mutation, now status post 32 months of treatment with Tarceva at 150 mg p.o. daily.  The patient is tolerating her Tarceva generally well.  She has no evidence for disease recurrence on recent scan.    PLAN: I discussed the scan results with the patient her husband and recommend for her to continue on Tarceva with the current dose. She would come back for followup visit in one month's for reevaluation with repeat CBC and CMET.   All questions were  answered. The patient knows to call the clinic with any problems, questions or concerns. We can certainly see the patient much sooner if necessary.

## 2011-11-13 ENCOUNTER — Encounter: Payer: Self-pay | Admitting: *Deleted

## 2011-11-13 NOTE — Progress Notes (Signed)
RECEIVED A FAX FROM [facility] CONCERNING A CONFIRMATION OF PRESCRIPTION SHIPMENT. 

## 2011-11-14 ENCOUNTER — Encounter (HOSPITAL_COMMUNITY): Payer: Self-pay | Admitting: Pharmacy Technician

## 2011-11-20 ENCOUNTER — Encounter (HOSPITAL_COMMUNITY): Payer: Self-pay | Admitting: *Deleted

## 2011-11-20 MED ORDER — KCL IN DEXTROSE-NACL 20-5-0.45 MEQ/L-%-% IV SOLN
INTRAVENOUS | Status: DC
  Filled 2011-11-20: qty 1000

## 2011-11-20 MED ORDER — CEFAZOLIN SODIUM 1-5 GM-% IV SOLN: 1.0000 g | INTRAVENOUS | Status: DC

## 2011-11-20 MED ORDER — CHLORHEXIDINE GLUCONATE 4 % EX LIQD: 60.0000 mL | Freq: Once | CUTANEOUS | Status: DC

## 2011-11-21 ENCOUNTER — Inpatient Hospital Stay (HOSPITAL_COMMUNITY): Admission: RE | Admit: 2011-11-21 | Payer: Medicare Other | Source: Ambulatory Visit

## 2011-11-21 ENCOUNTER — Ambulatory Visit (HOSPITAL_COMMUNITY): Payer: Medicare Other

## 2011-11-21 ENCOUNTER — Encounter (HOSPITAL_COMMUNITY): Payer: Self-pay | Admitting: Anesthesiology

## 2011-11-21 ENCOUNTER — Other Ambulatory Visit: Payer: Self-pay

## 2011-11-21 ENCOUNTER — Encounter (HOSPITAL_COMMUNITY)
Admission: RE | Disposition: A | Discharge status: Home / Self Care | Payer: Self-pay | Source: Ambulatory Visit | Attending: Orthopedic Surgery

## 2011-11-21 ENCOUNTER — Other Ambulatory Visit: Payer: Self-pay | Admitting: Orthopedic Surgery

## 2011-11-21 ENCOUNTER — Encounter (HOSPITAL_COMMUNITY): Payer: Self-pay | Admitting: *Deleted

## 2011-11-21 ENCOUNTER — Ambulatory Visit (HOSPITAL_COMMUNITY)
Admission: RE | Admit: 2011-11-21 | Discharge: 2011-11-21 | Disposition: A | Discharge status: Home / Self Care | Payer: Medicare Other | Source: Ambulatory Visit | Attending: Orthopedic Surgery | Admitting: Orthopedic Surgery

## 2011-11-21 ENCOUNTER — Ambulatory Visit (HOSPITAL_COMMUNITY): Payer: Medicare Other | Admitting: Anesthesiology

## 2011-11-21 DIAGNOSIS — M659 Synovitis and tenosynovitis, unspecified: Secondary | ICD-10-CM

## 2011-11-21 DIAGNOSIS — K219 Gastro-esophageal reflux disease without esophagitis: Secondary | ICD-10-CM | POA: Insufficient documentation

## 2011-11-21 DIAGNOSIS — J449 Chronic obstructive pulmonary disease, unspecified: Secondary | ICD-10-CM | POA: Insufficient documentation

## 2011-11-21 DIAGNOSIS — J4489 Other specified chronic obstructive pulmonary disease: Secondary | ICD-10-CM | POA: Insufficient documentation

## 2011-11-21 DIAGNOSIS — M259 Joint disorder, unspecified: Secondary | ICD-10-CM | POA: Insufficient documentation

## 2011-11-21 HISTORY — DX: Unspecified osteoarthritis, unspecified site: M19.90

## 2011-11-21 HISTORY — DX: Gastro-esophageal reflux disease without esophagitis: K21.9

## 2011-11-21 HISTORY — DX: Chronic obstructive pulmonary disease, unspecified: J44.9

## 2011-11-21 SURGERY — REMOVAL, CYST, HAND
Anesthesia: General | Site: Wrist | Laterality: Right | Wound class: Clean

## 2011-11-21 MED ORDER — HYDROMORPHONE HCL PF 1 MG/ML IJ SOLN
0.2500 mg | INTRAMUSCULAR | Status: DC | PRN
  Administered 2011-11-21: 0.5 mg via INTRAVENOUS

## 2011-11-21 MED ORDER — MUPIROCIN 2 % EX OINT
TOPICAL_OINTMENT | CUTANEOUS | Status: AC
  Administered 2011-11-21: 13:00:00 via NASAL
  Filled 2011-11-21: qty 22

## 2011-11-21 MED ORDER — PROMETHAZINE HCL 25 MG/ML IJ SOLN
INTRAMUSCULAR | Status: AC
  Filled 2011-11-21: qty 1

## 2011-11-21 MED ORDER — CHLORHEXIDINE GLUCONATE 4 % EX LIQD: 60.0000 mL | Freq: Once | CUTANEOUS | Status: DC

## 2011-11-21 MED ORDER — MIDAZOLAM HCL 5 MG/5ML IJ SOLN
INTRAMUSCULAR | Status: DC | PRN
  Administered 2011-11-21: 2 mg via INTRAVENOUS

## 2011-11-21 MED ORDER — ONDANSETRON HCL 4 MG/2ML IJ SOLN: 4.0000 mg | Freq: Once | INTRAMUSCULAR | Status: DC

## 2011-11-21 MED ORDER — PHENYLEPHRINE HCL 10 MG/ML IJ SOLN
INTRAMUSCULAR | Status: DC | PRN
  Administered 2011-11-21: 60 ug via INTRAVENOUS

## 2011-11-21 MED ORDER — FENTANYL CITRATE 0.05 MG/ML IJ SOLN
INTRAMUSCULAR | Status: DC | PRN
  Administered 2011-11-21: 100 ug via INTRAVENOUS
  Administered 2011-11-21 (×3): 25 ug via INTRAVENOUS

## 2011-11-21 MED ORDER — PROPOFOL 10 MG/ML IV EMUL
INTRAVENOUS | Status: DC | PRN
  Administered 2011-11-21: 100 mg via INTRAVENOUS

## 2011-11-21 MED ORDER — ONDANSETRON HCL 4 MG/2ML IJ SOLN
INTRAMUSCULAR | Status: AC
  Administered 2011-11-21: 4 mg
  Filled 2011-11-21: qty 2

## 2011-11-21 MED ORDER — SODIUM CHLORIDE 0.9 % IR SOLN
Status: DC | PRN
  Administered 2011-11-21: 1000 mL

## 2011-11-21 MED ORDER — CLINDAMYCIN PHOSPHATE 600 MG/50ML IV SOLN
600.0000 mg | INTRAVENOUS | Status: AC
  Administered 2011-11-21: 600 mg via INTRAVENOUS
  Filled 2011-11-21: qty 50

## 2011-11-21 MED ORDER — BUPIVACAINE HCL 0.25 % IJ SOLN
INTRAMUSCULAR | Status: DC | PRN
  Administered 2011-11-21: 9 mg

## 2011-11-21 MED ORDER — PROMETHAZINE HCL 25 MG/ML IJ SOLN
6.2500 mg | INTRAMUSCULAR | Status: DC | PRN
  Administered 2011-11-21: 6.25 mg via INTRAVENOUS

## 2011-11-21 MED ORDER — EPHEDRINE SULFATE 50 MG/ML IJ SOLN
INTRAMUSCULAR | Status: DC | PRN
  Administered 2011-11-21: 5 mg via INTRAVENOUS

## 2011-11-21 MED ORDER — DOCUSATE SODIUM 100 MG PO CAPS: 100.0000 mg | ORAL_CAPSULE | Freq: Two times a day (BID) | ORAL | Status: AC

## 2011-11-21 MED ORDER — HYDROCODONE-ACETAMINOPHEN 5-500 MG PO TABS: 1.0000 | ORAL_TABLET | Freq: Four times a day (QID) | ORAL | Status: AC | PRN

## 2011-11-21 MED ORDER — HYDROCODONE-ACETAMINOPHEN 5-500 MG PO TABS: 1.0000 | ORAL_TABLET | Freq: Four times a day (QID) | ORAL | Status: DC | PRN

## 2011-11-21 MED ORDER — LACTATED RINGERS IV SOLN
INTRAVENOUS | Status: DC | PRN
  Administered 2011-11-21: 14:00:00 via INTRAVENOUS

## 2011-11-21 SURGICAL SUPPLY — 59 items
BANDAGE CONFORM 2  STR LF (GAUZE/BANDAGES/DRESSINGS) ×1 IMPLANT
BANDAGE ELASTIC 3 VELCRO ST LF (GAUZE/BANDAGES/DRESSINGS) ×2 IMPLANT
BANDAGE ELASTIC 4 VELCRO ST LF (GAUZE/BANDAGES/DRESSINGS) ×2 IMPLANT
BANDAGE GAUZE ELAST BULKY 4 IN (GAUZE/BANDAGES/DRESSINGS) ×1 IMPLANT
BNDG CMPR 9X4 STRL LF SNTH (GAUZE/BANDAGES/DRESSINGS) ×1
BNDG COHESIVE 1X5 TAN STRL LF (GAUZE/BANDAGES/DRESSINGS) ×1 IMPLANT
BNDG ESMARK 4X9 LF (GAUZE/BANDAGES/DRESSINGS) ×1 IMPLANT
CLOTH BEACON ORANGE TIMEOUT ST (SAFETY) ×1 IMPLANT
CONT SPECI 4OZ STER CLIK (MISCELLANEOUS) ×2 IMPLANT
CORDS BIPOLAR (ELECTRODE) ×1 IMPLANT
COVER SURGICAL LIGHT HANDLE (MISCELLANEOUS) ×1 IMPLANT
CUFF TOURNIQUET SINGLE 18IN (TOURNIQUET CUFF) ×1 IMPLANT
DRAIN PENROSE 1/4X12 LTX STRL (WOUND CARE) ×1 IMPLANT
DRAPE SURG 17X23 STRL (DRAPES) ×1 IMPLANT
DRSG ADAPTIC 3X8 NADH LF (GAUZE/BANDAGES/DRESSINGS) ×2 IMPLANT
ELECT REM PT RETURN 9FT ADLT (ELECTROSURGICAL) ×2
ELECTRODE REM PT RTRN 9FT ADLT (ELECTROSURGICAL) IMPLANT
GAUZE KERLIX 2  STERILE LF (GAUZE/BANDAGES/DRESSINGS) ×1 IMPLANT
GAUZE SPONGE 4X4 12PLY STRL LF (GAUZE/BANDAGES/DRESSINGS) ×1 IMPLANT
GAUZE XEROFORM 1X8 LF (GAUZE/BANDAGES/DRESSINGS) ×1 IMPLANT
GLOVE BIOGEL PI IND STRL 8.5 (GLOVE) IMPLANT
GLOVE BIOGEL PI INDICATOR 8.5 (GLOVE) ×1
GLOVE SURG ORTHO 8.0 STRL STRW (GLOVE) ×1 IMPLANT
GOWN PREVENTION PLUS XLARGE (GOWN DISPOSABLE) ×1 IMPLANT
GOWN STRL NON-REIN LRG LVL3 (GOWN DISPOSABLE) ×1 IMPLANT
HANDPIECE INTERPULSE COAX TIP (DISPOSABLE) ×2
KIT BASIN OR (CUSTOM PROCEDURE TRAY) ×1 IMPLANT
KIT ROOM TURNOVER OR (KITS) ×1 IMPLANT
MANIFOLD NEPTUNE II (INSTRUMENTS) ×1 IMPLANT
NDL HYPO 25GX1X1/2 BEV (NEEDLE) IMPLANT
NEEDLE HYPO 25GX1X1/2 BEV (NEEDLE) ×2 IMPLANT
NS IRRIG 1000ML POUR BTL (IV SOLUTION) ×1 IMPLANT
PACK ORTHO EXTREMITY (CUSTOM PROCEDURE TRAY) ×1 IMPLANT
PAD ARMBOARD 7.5X6 YLW CONV (MISCELLANEOUS) ×1 IMPLANT
PADDING CAST SYNTHETIC 3 NS LF (CAST SUPPLIES) ×1
PADDING CAST SYNTHETIC 3X4 NS (CAST SUPPLIES) IMPLANT
SET HNDPC FAN SPRY TIP SCT (DISPOSABLE) IMPLANT
SOAP 2 % CHG 4 OZ (WOUND CARE) ×1 IMPLANT
SPLINT FIBERGLASS 3X35 (CAST SUPPLIES) ×1 IMPLANT
SPONGE GAUZE 4X4 12PLY (GAUZE/BANDAGES/DRESSINGS) ×1 IMPLANT
SPONGE LAP 18X18 X RAY DECT (DISPOSABLE) ×1 IMPLANT
SPONGE LAP 4X18 X RAY DECT (DISPOSABLE) ×1 IMPLANT
SUCTION FRAZIER TIP 10 FR DISP (SUCTIONS) ×1 IMPLANT
SUT ETHILON 4 0 PS 2 18 (SUTURE) ×1 IMPLANT
SUT ETHILON 5 0 P 3 18 (SUTURE) ×1
SUT NYLON ETHILON 5-0 P-3 1X18 (SUTURE) IMPLANT
SUT PROLENE 4 0 RB 1 (SUTURE) ×2
SUT PROLENE 4-0 RB1 .5 CRCL 36 (SUTURE) IMPLANT
SUT VIC AB 2-0 CTX 36 (SUTURE) ×1 IMPLANT
SUT VIC AB 2-0 SH 27 (SUTURE) ×2
SUT VIC AB 2-0 SH 27X BRD (SUTURE) IMPLANT
SYR CONTROL 10ML LL (SYRINGE) ×1 IMPLANT
TOWEL OR 17X24 6PK STRL BLUE (TOWEL DISPOSABLE) ×1 IMPLANT
TOWEL OR 17X26 10 PK STRL BLUE (TOWEL DISPOSABLE) ×1 IMPLANT
TUBE ANAEROBIC SPECIMEN COL (MISCELLANEOUS) ×1 IMPLANT
TUBE CONNECTING 12X1/4 (SUCTIONS) ×1 IMPLANT
UNDERPAD 30X30 INCONTINENT (UNDERPADS AND DIAPERS) ×1 IMPLANT
WATER STERILE IRR 1000ML POUR (IV SOLUTION) ×1 IMPLANT
YANKAUER SUCT BULB TIP NO VENT (SUCTIONS) ×1 IMPLANT

## 2011-11-21 NOTE — H&P (Signed)
Dana [name] is an 76 y.o. female.   Chief Complaint: Enlarging right wrist mass HPI: Pt with increasing size mass over 6th dorsal compartment, patient here for surgery, incisional biopsy Pt elects surgery  Past Medical History  Diagnosis Date  . Hemorrhoid   . COPD (chronic obstructive pulmonary disease)   . Shortness of breath   . GERD (gastroesophageal reflux disease)   . Arthritis   . Cancer     lung, currently being treated with Oral med    Past Surgical History  Procedure Date  . Breast surgery 02/1999    left nipple discharge/ Central duct excision  . Anal fissure repair 07/09/2011  . Knee arthroscopy     R  . Abdominal hysterectomy   . Thumb arthroscopy     L - removed bone spur  . Tonsillectomy   . Appendectomy   . US echocardiography   . Thoracoscopy     w/lung biopsy  . Cardiovascular stress test     Family History  Problem Relation Age of Onset  . Cancer Father     bladder  . Cancer Son     kidney - removed as a teenager   Social History:  reports that she has quit smoking. She does not have any smokeless tobacco history on file. She reports that she does not drink alcohol or use illicit drugs.  Allergies:  Allergies  Allergen Reactions  . Amoxicillin Diarrhea    Medications Prior to Admission  Medication Dose Route Frequency Provider Last Rate Last Dose  . ceFAZolin (ANCEF) IVPB 1 g/50 mL premix  1 g Intravenous 60 min Pre-Op Sharma Covert      . chlorhexidine (HIBICLENS) 4 % liquid 4 application  60 mL Topical Once Neeva Trew W Aubreanna Percle      . clindamycin (CLEOCIN) IVPB 600 mg  600 mg Intravenous 60 min Pre-Op Sharma Covert      . dextrose 5 % and 0.45 % NaCl with KCl 20 mEq/L infusion   Intravenous Continuous Sharma Covert      . mupirocin ointment (BACTROBAN) 2 %           . DISCONTD: chlorhexidine (HIBICLENS) 4 % liquid 4 application  60 mL Topical Once Thayer Embleton W Teylor Wolven       Medications Prior to Admission  Medication Sig Dispense Refill  .  calcium carbonate (OS-CAL) 600 MG TABS Take 600 mg by mouth 2 (two) times daily with a meal.        . conjugated estrogens (PREMARIN) vaginal cream Place vaginally once a week.        . Cyanocobalamin (B-12) 1000 MCG CAPS Take 1,000 mcg by mouth daily.       Marland Kitchen erlotinib (TARCEVA) 150 MG tablet Take 150 mg by mouth daily. Take on an empty stomach 1 hour before meals or 2 hours after.       . loratadine (CLARITIN) 10 MG tablet Take 10 mg by mouth daily.        . Multiple Vitamin (MULTIVITAMIN) tablet Take 1 tablet by mouth daily.        . nabumetone (RELAFEN) 500 MG tablet Take 250-1,000 mg by mouth daily as needed. For pain       . omeprazole (PRILOSEC) 10 MG capsule Take 10 mg by mouth daily.        Marland Kitchen tiotropium (SPIRIVA) 18 MCG inhalation capsule Place 18 mcg into inhaler and inhale daily.        Marland Kitchen VITAMIN  D, ERGOCALCIFEROL, PO Take 2,000 Units by mouth daily.          No results found for this or any previous visit (from the past 48 hour(s)). Dg Chest 2 View  11/21/2011  *RADIOLOGY REPORT*  Clinical Data: Preop.  Lung cancer shortness of breath.  CHEST - 2 VIEW  Comparison: CT chest 11/09/2011 and chest radiograph 02/26/2011  Findings: The lungs are emphysematous.  Heart size is stable. Mildly prominent hilar contours are stable.  There is chronic slight elevation of the right hemidiaphragm.  There is a small to moderate to right pleural effusion.  Linear scar or atelectasis at the medial right lung base.  The left lung is clear.  Mild convex left scoliosis of the thoracic spine and mild convex right scoliosis of the lower thoracic/lumbar spine.  IMPRESSION:  1.  Small to moderate right pleural effusion. 2.  Emphysema.  Original Report Authenticated By: Britta Mccreedy, M.D.    No recent illnesses, hospitalizations  Blood pressure 155/94, pulse 94, temperature 98.1 F (36.7 C), temperature source Oral, resp. rate 18, height 5' 3.5" (1.613 m), weight 59.376 kg (130 lb 14.4 oz), SpO2  98.00%. General Appearance:  Alert, cooperative, no distress, appears stated age  Head:  Normocephalic, without obvious abnormality, atraumatic  Eyes:  Pupils equal, conjunctiva/corneas clear,         Throat: Lips, mucosa, and tongue normal; teeth and gums normal  Neck: No visible masses     Lungs:   respirations unlabored  Chest Wall:  No tenderness or deformity  Heart:  Regular rate and rhythm,  Abdomen:   Soft, non-tender,         Extremities: Right wrist several cm mass over 6th dorsal compartment. No skin changes, good wrist and digital mobility fingers warm well perfused.  Pulses: 2+ and symmetric  Skin: Skin color, texture, turgor normal, no rashes or lesions     Neurologic: Normal     Assessment/Plan Right Wrist mass  TO OR FOR INCISIONAL VERSUS EXCISIONAL BIOPSY. PT ELECTS SURGERY. R/B/A DISCUSSED WITH PATIENT PATIENT VOICED UNDERSTANDING AND CONSENT SIGNED PT SEEN AND EXAMINED ON DAY OF SURGERY  Sharma Covert 11/21/2011, 1:32 PM

## 2011-11-21 NOTE — Brief Op Note (Signed)
11/21/2011  3:22 PM  PATIENT:  Dana [name]  76 y.o. female  PRE-OPERATIVE DIAGNOSIS:  right wrist deep mass  POST-OPERATIVE DIAGNOSIS:  Right wrist deep mass   PROCEDURE:  Procedure RIGHT WRIST 6TH DORSAL COMPARTMENT TENOSYNOVECTOMY  SURGEON:  Surgeon(s): Sharma Covert  PHYSICIAN ASSISTANT:   ASSISTANTS: none   ANESTHESIA:   general  EBL:  Total I/O In: 1300 [I.V.:1300] Out: 20 [Blood:20]  BLOOD ADMINISTERED:none  DRAINS: none   LOCAL MEDICATIONS USED:  MARCAINE 10CC  SPECIMEN:  Biopsy / Limited Resection  DISPOSITION OF SPECIMEN:  PATHOLOGY  COUNTS:  YES  TOURNIQUET:   Total Tourniquet Time Documented: Upper Arm (Right) - 47 minutes  DICTATION: .Other Dictation: Dictation Number [id number]  PLAN OF CARE: Discharge to home after PACU  PATIENT DISPOSITION:  PACU - hemodynamically stable.   Delay start of Pharmacological VTE agent (>24hrs) due to surgical blood loss or risk of bleeding:  {YES/NO/NOT APPLICABLE:20182

## 2011-11-21 NOTE — Anesthesia Preprocedure Evaluation (Addendum)
Anesthesia Evaluation  Patient identified by MRN, date of birth, ID band Patient awake    Reviewed: Allergy & Precautions, H&P , NPO status , Patient's Chart, lab work & pertinent test results  Airway Mallampati: II TM Distance: >3 FB Neck ROM: Full    Dental   Pulmonary shortness of breath (lung ca), COPD + rhonchi        Cardiovascular     Neuro/Psych    GI/Hepatic GERD-  ,  Endo/Other    Renal/GU      Musculoskeletal   Abdominal   Peds  Hematology   Anesthesia Other Findings   Reproductive/Obstetrics                           Anesthesia Physical Anesthesia Plan  ASA: III  Anesthesia Plan: General   Post-op Pain Management:    Induction: Intravenous  Airway Management Planned: LMA  Additional Equipment:   Intra-op Plan:   Post-operative Plan:   Informed Consent: I have reviewed the patients History and Physical, chart, labs and discussed the procedure including the risks, benefits and alternatives for the proposed anesthesia with the patient or authorized representative who has indicated his/her understanding and acceptance.     Plan Discussed with: CRNA and Surgeon  Anesthesia Plan Comments:         Anesthesia Quick Evaluation

## 2011-11-21 NOTE — Anesthesia Procedure Notes (Addendum)
Performed by: Carmela Rima    Procedure Name: LMA Insertion Date/Time: 11/21/2011 1:52 PM Performed by: Carmela Rima Pre-anesthesia Checklist: Patient identified, Timeout performed, Emergency Drugs available, Suction available and Patient being monitored Patient Re-evaluated:Patient Re-evaluated prior to inductionOxygen Delivery Method: Circle System Utilized Preoxygenation: Pre-oxygenation with 100% oxygen Intubation Type: IV induction Ventilation: Mask ventilation without difficulty LMA: LMA inserted LMA Size: 4.0 Tube type: Oral Tube secured with: Tape Dental Injury: Teeth and Oropharynx as per pre-operative assessment

## 2011-11-21 NOTE — Op Note (Signed)
NAMEANNALEA, ALGUIRE NO.:  [id number]  MEDICAL RECORD NO.:  [id number]  LOCATION:  [facility]                         FACILITY:  [facility]  PHYSICIAN:  Madelynn Done, MD  DATE OF BIRTH:  February 01, 1935  DATE OF PROCEDURE:  11/21/2011 DATE OF DISCHARGE:  11/21/2011                              OPERATIVE REPORT   PREOPERATIVE DIAGNOSIS:  Right wrist 6th dorsal compartment deep mass.  POSTOPERATIVE DIAGNOSIS:  Right wrist 6th dorsal compartment deep mass.  ATTENDING PHYSICIAN:  Madelynn Done, MD, who scrubbed and present for the entire procedure.  ASSISTANT SURGEON:  None.  ANESTHESIA:  General via LMA.  TOURNIQUET TIME:  Less than 45 minutes at 250 mmHg.  SURGICAL SPECIMEN:  Right wrist mass to pathology.  The patient did have a frozen section which did not show any evidence of malignancy and completion of the 6th dorsal compartment tenosynovectomy was carried out after the mass was sent to Pathology for frozen section.  SURGICAL INDICATIONS:  Ms. Sandles is a 76 year old right-hand-dominant female with an enlarging mass of the right wrist.  The patient elected to undergo the above procedure.  Risks, benefits, and alternatives were discussed in detail with the patient, and signed informed consent was obtained.  Risks include, but not limited to, bleeding, infection, damage to nearby nerves, arteries, and tendons, loss of motion of the elbow, wrist, and digits, and need for further surgical intervention.  DESCRIPTION OF THE PROCEDURE:  The patient was properly identified in the preop holding area, a mark with permanent marker was made on the right hand to indicate correct operative site.  The patient was brought back to operating room and placed supine on anesthesia room table. General anesthesia was administered.  The patient tolerated this well. A well-padded tourniquet was then placed on the right wrist and sealed with 1000 drape.  The right upper extremity  was then prepped and draped in normal sterile fashion.  Time-out was called, the correct side was identified, and procedure was then begun.  Attention was then turned to the right wrist.  A longitudinal incision was made directly over the 6th dorsal compartment.  Dissection was carried down through the skin and subcutaneous tissue.  The retinaculum was then incised longitudinally. The retinacular flaps were then elevated.  A portion of the tissue of the 6th dorsal compartment was then circumferentially dissected free and excised.  This was sent down to Pathology.  Frozen section did not show any evidence of malignancy.  Following this, a radical tenosynovectomy of the 6th dorsal compartments was then carried out, both proximally and distally, removing the visible pathologic tissue.  Aggressive tenosynovectomy was then carried out and the tissue was then sent for cultures, aerobic, anaerobic, fungal, and atypical mycobacterium.  The wound was then thoroughly irrigated.  In the 6th dorsal compartment, remainder of the subsheath was then repaired with a 2-0 Vicryl suture. The subsheath was then repaired with 2-0 Vicryl, and the remaining retinaculum was then closed with 2-0 Vicryl.  The wound was then thoroughly irrigated.  The skin was then closed using a running horizontal mattress Prolene suture.  A 10 mL of 0.25% Marcaine was infiltrated  locally.  Adaptic dressing and sterile compressive bandage was then applied.  The patient was then placed in a well-padded volar splint, extubated, and taken to recovery room in good condition. The patient's dorsal ulnar sensory nerve was identified throughout the entire case.  Careful dissection was then carried out of the dorsal sensory nerve.  Neurolysis was then carried out deferring the nerve from the pathologic tissue.  SURGICAL PROCEDURES: 1. Right wrist 6th dorsal compartment radical tenosynovectomy. 2. Right wrist dorsal sensory nerve  neurolysis and dissection.     Madelynn Done, MD     FWO/MEDQ  D:  11/21/2011  T:  11/21/2011  Job:  [id number]

## 2011-11-21 NOTE — Preoperative (Signed)
Beta Blockers   Reason not to administer Beta Blockers:Not Applicable 

## 2011-11-21 NOTE — Anesthesia Postprocedure Evaluation (Signed)
  Anesthesia Post-op Note  Patient: Dana [name]  Procedure(s) Performed:  CYST REMOVAL HAND  Patient Location: PACU  Anesthesia Type: General  Level of Consciousness: awake  Airway and Oxygen Therapy: Patient connected to nasal cannula oxygen  Post-op Pain: none  Post-op Assessment: Post-op Vital signs reviewed  Post-op Vital Signs: stable  Complications: No apparent anesthesia complications

## 2011-11-21 NOTE — Transfer of Care (Signed)
Immediate Anesthesia Transfer of Care Note  Patient: Dana [name]  Procedure(s) Performed:  CYST REMOVAL HAND  Patient Location: PACU  Anesthesia Type: General  Level of Consciousness: awake, alert  and oriented  Airway & Oxygen Therapy: Patient Spontanous Breathing and Patient connected to nasal cannula oxygen  Post-op Assessment: Report given to PACU RN  Post vital signs: stable  Complications: No apparent anesthesia complications

## 2011-11-25 LAB — TISSUE CULTURE
Culture: NO GROWTH
Gram Stain: NONE SEEN

## 2011-12-10 ENCOUNTER — Telehealth: Payer: Self-pay | Admitting: Internal Medicine

## 2011-12-10 ENCOUNTER — Ambulatory Visit (HOSPITAL_BASED_OUTPATIENT_CLINIC_OR_DEPARTMENT_OTHER): Payer: Medicare Other | Admitting: Physician Assistant

## 2011-12-10 ENCOUNTER — Encounter: Payer: Self-pay | Admitting: Physician Assistant

## 2011-12-10 ENCOUNTER — Other Ambulatory Visit (HOSPITAL_BASED_OUTPATIENT_CLINIC_OR_DEPARTMENT_OTHER): Payer: Medicare Other | Admitting: Lab

## 2011-12-10 VITALS — BP 146/85 | HR 86 | Temp 97.7°F | Ht 63.5 in | Wt 131.5 lb

## 2011-12-10 DIAGNOSIS — C3490 Malignant neoplasm of unspecified part of unspecified bronchus or lung: Secondary | ICD-10-CM

## 2011-12-10 DIAGNOSIS — R51 Headache: Secondary | ICD-10-CM

## 2011-12-10 LAB — COMPREHENSIVE METABOLIC PANEL
ALT: 15 U/L (ref 0–35)
AST: 22 U/L (ref 0–37)
Alkaline Phosphatase: 53 U/L (ref 39–117)
Creatinine, Ser: 0.67 mg/dL (ref 0.50–1.10)
Sodium: 144 mEq/L (ref 135–145)
Total Bilirubin: 0.6 mg/dL (ref 0.3–1.2)

## 2011-12-10 LAB — CBC WITH DIFFERENTIAL/PLATELET
BASO%: 0.1 % (ref 0.0–2.0)
HCT: 36.4 % (ref 34.8–46.6)
LYMPH%: 11.1 % — ABNORMAL LOW (ref 14.0–49.7)
MCH: 31 pg (ref 25.1–34.0)
MCHC: 33.7 g/dL (ref 31.5–36.0)
MCV: 91.9 fL (ref 79.5–101.0)
MONO#: 0.5 10*3/uL (ref 0.1–0.9)
MONO%: 10 % (ref 0.0–14.0)
NEUT%: 73.9 % (ref 38.4–76.8)
Platelets: 226 10*3/uL (ref 145–400)
WBC: 4.9 10*3/uL (ref 3.9–10.3)

## 2011-12-10 NOTE — Telephone Encounter (Signed)
gv pt appt schedule for jan °

## 2011-12-10 NOTE — Progress Notes (Signed)
No images are attached to the encounter. No scans are attached to the encounter. No scans are attached to the encounter. [facility] OFFICE PROGRESS NOTE  [name],[name], MD, MD [address], [address] [place] [address]  DIAGNOSIS: :  Stage IIIA nonsmall cell lung cancer, adenocarcinoma, in a never smoker patient with positive EGFR mutation diagnosed in January 2010.  PRIOR THERAPY: Status post concurrent chemoradiation with weekly carboplatin and paclitaxel; last dose given March 21, 2009.  CURRENT THERAPY: Tarceva at 150 mg p.o. daily, status post 33 months of treatment.  INTERVAL HISTORY: Dana [name] 76 y.o. female returns to clinic today for a followup visit. The patient is feeling fine she denied having any significant complaints. She has no fever or chills, no headache, no blurry vision. She denied having any significant skin rash or diarrhea. In general she continues to tolerate her treatment was Tarceva fairly well. She is status post surgery on her right wrist for a mass that was removed by Dr. Orlan Leavens. She is scheduled to followup with him in 12/31/2011.  MEDICAL HISTORY: Past Medical History  Diagnosis Date  . Hemorrhoid   . COPD (chronic obstructive pulmonary disease)   . Shortness of breath   . GERD (gastroesophageal reflux disease)   . Arthritis   . Cancer     lung, currently being treated with Oral med    ALLERGIES:  is allergic to amoxicillin.  MEDICATIONS:  Current Outpatient Prescriptions  Medication Sig Dispense Refill  . calcium carbonate (OS-CAL) 600 MG TABS Take 600 mg by mouth 2 (two) times daily with a meal.        . conjugated estrogens (PREMARIN) vaginal cream Place vaginally once a week.        . Cyanocobalamin (B-12) 1000 MCG CAPS Take 1,000 mcg by mouth daily.       Marland Kitchen erlotinib (TARCEVA) 150 MG tablet Take 150 mg by mouth daily. Take on an empty stomach 1 hour before meals or 2 hours after.       Marland Kitchen HYDROcodone-acetaminophen  (VICODIN) 5-500 MG per tablet       . loratadine (CLARITIN) 10 MG tablet Take 10 mg by mouth daily.        . Multiple Vitamin (MULTIVITAMIN) tablet Take 1 tablet by mouth daily.        . nabumetone (RELAFEN) 500 MG tablet Take 250-1,000 mg by mouth daily as needed. For pain       . omeprazole (PRILOSEC) 10 MG capsule Take 20 mg by mouth daily.       Marland Kitchen tiotropium (SPIRIVA) 18 MCG inhalation capsule Place 18 mcg into inhaler and inhale daily.        Marland Kitchen VITAMIN D, ERGOCALCIFEROL, PO Take 2,000 Units by mouth daily.          SURGICAL HISTORY:  Past Surgical History  Procedure Date  . Breast surgery 02/1999    left nipple discharge/ Central duct excision  . Anal fissure repair 07/09/2011  . Knee arthroscopy     R  . Abdominal hysterectomy   . Thumb arthroscopy     L - removed bone spur  . Tonsillectomy   . Appendectomy   . US echocardiography   . Thoracoscopy     w/lung biopsy  . Cardiovascular stress test     REVIEW OF SYSTEMS:  A comprehensive review of systems was negative except for: Musculoskeletal: positive for Swelling or redness and soreness of the right wrist extending into the hand  PHYSICAL EXAMINATION: General appearance: alert, cooperative and no distress Head: Normocephalic, without obvious abnormality, atraumatic Neck: no adenopathy Lymph nodes: Cervical, supraclavicular, and axillary nodes normal. Resp: clear to auscultation bilaterally Cardio: regular rate and rhythm, S1, S2 normal, no murmur, click, rub or gallop GI: soft, non-tender; bowel sounds normal; no masses,  no organomegaly Extremities: extremities normal, atraumatic, no cyanosis or edema Neurologic: Alert and oriented X 3, normal strength and tone. Normal symmetric reflexes. Normal coordination and gait Musculoskeletal: There is a soft tissue edema and erythema about the surgical his incision on the right lateral wrist with mild warmth the area is tender extending onto the dorsum of the hand proximal to  the incision.  ECOG PERFORMANCE STATUS: 0 - Asymptomatic  Blood pressure 146/85, pulse 86, temperature 97.7 F (36.5 C), temperature source Oral, height 5' 3.5" (1.613 m), weight 131 lb 8 oz (59.648 kg).  LABORATORY DATA: Lab Results  Component Value Date   WBC 4.9 12/10/2011   HGB 12.3 12/10/2011   HCT 36.4 12/10/2011   MCV 91.9 12/10/2011   PLT 226 12/10/2011      Chemistry      Component Value Date/Time   NA 145 11/09/2011 1345   NA 142 10/11/2011 0952   K 3.9 11/09/2011 1345   K 4.0 10/11/2011 0952   CL 104 11/09/2011 1345   CL 106 10/11/2011 0952   CO2 28 11/09/2011 1345   CO2 23 10/11/2011 0952   BUN 20 11/09/2011 1345   BUN 20 10/11/2011 0952   CREATININE 0.6 11/09/2011 1345   CREATININE 0.69 10/11/2011 0952      Component Value Date/Time   CALCIUM 8.7 11/09/2011 1345   CALCIUM 9.1 10/11/2011 0952   ALKPHOS 52 11/09/2011 1345   ALKPHOS 56 10/11/2011 0952   AST 20 11/09/2011 1345   AST 25 10/11/2011 0952   ALT 18 10/11/2011 0952   BILITOT 0.60 11/09/2011 1345   BILITOT 0.7 10/11/2011 0952       RADIOGRAPHIC STUDIES: Ct Chest W Contrast  11/09/2011  *RADIOLOGY REPORT*  Clinical Data: Lung cancer follow-up  CT CHEST WITH CONTRAST  Technique:  Multidetector CT imaging of the chest was performed following the standard protocol during bolus administration of intravenous contrast.  Contrast:  80 ml of omni 300  Comparison: 08/09/2011  Findings: There are no enlarged axillary or supraclavicular lymph nodes.  There are no pathologically enlarged mediastinal or hilar lymph nodes.  There is a moderate-sized right pleural effusion.  Not changed in volume from previous exam.  No left effusion.  No pericardial effusion.  Stable nodule in the left base measuring 4.4 mm, image 41. Tiny nodule in the right upper lobe measures 3.3 mm, image 14.  Stable from previous exam.  There are no specific features to suggest residual or recurrence of tumor.  IMPRESSION:  1.  Stable CT of  the chest. 2.  No significant change in right pleural effusion.  Original Report Authenticated By: Rosealee Albee, M.D.    ASSESSMENT/PLAN: This is a very pleasant 76 year old white female with a history of stage III nonsmall cell lung cancer with positive EGFR mutation, now status post 33 months of treatment with Tarceva at 150 mg p.o. daily.  The patient is tolerating her Tarceva generally well.  She has no evidence for disease recurrence on recent scan. Patient was discussed with Dr. Arbutus Ped. She will continue on her current dose of Tarceva and return in one month with a repeat CBC differential and C. met for  reevaluation. I advised her to contact Dr. Paulette Blanch office regarding her incisional discomfort,erythema and warmth.      All questions were answered. The patient knows to call the clinic with any problems, questions or concerns. We can certainly see the patient much sooner if necessary.

## 2011-12-13 ENCOUNTER — Other Ambulatory Visit: Payer: Self-pay | Admitting: *Deleted

## 2011-12-13 ENCOUNTER — Encounter: Payer: Self-pay | Admitting: *Deleted

## 2011-12-13 DIAGNOSIS — C3490 Malignant neoplasm of unspecified part of unspecified bronchus or lung: Secondary | ICD-10-CM

## 2011-12-13 MED ORDER — ERLOTINIB HCL 150 MG PO TABS: 150.0000 mg | ORAL_TABLET | Freq: Every day | ORAL | Status: DC

## 2011-12-13 NOTE — Progress Notes (Signed)
RECEIVED A FAX FROM [facility] CONCERNING A CONFIRMATION OF FACSIMILE RECEIPT. 

## 2011-12-14 ENCOUNTER — Encounter: Payer: Self-pay | Admitting: *Deleted

## 2011-12-14 NOTE — Progress Notes (Signed)
RECEIVED A FAX FROM [facility] CONCERNING A CONFIRMATION OF PRESCRIPTION SHIPMENT FOR TARCEVA. 

## 2011-12-19 LAB — FUNGUS CULTURE W SMEAR

## 2012-01-14 ENCOUNTER — Telehealth: Payer: Self-pay | Admitting: Internal Medicine

## 2012-01-14 ENCOUNTER — Other Ambulatory Visit (HOSPITAL_BASED_OUTPATIENT_CLINIC_OR_DEPARTMENT_OTHER): Payer: Medicare Other | Admitting: Lab

## 2012-01-14 ENCOUNTER — Ambulatory Visit (HOSPITAL_BASED_OUTPATIENT_CLINIC_OR_DEPARTMENT_OTHER): Payer: Medicare Other | Admitting: Physician Assistant

## 2012-01-14 ENCOUNTER — Encounter: Payer: Self-pay | Admitting: Physician Assistant

## 2012-01-14 VITALS — BP 136/77 | HR 93 | Temp 97.7°F | Ht 63.5 in | Wt 129.5 lb

## 2012-01-14 DIAGNOSIS — C3490 Malignant neoplasm of unspecified part of unspecified bronchus or lung: Secondary | ICD-10-CM

## 2012-01-14 DIAGNOSIS — Z79899 Other long term (current) drug therapy: Secondary | ICD-10-CM

## 2012-01-14 LAB — CBC WITH DIFFERENTIAL/PLATELET
BASO%: 0.2 % (ref 0.0–2.0)
Basophils Absolute: 0 10*3/uL (ref 0.0–0.1)
EOS%: 4.8 % (ref 0.0–7.0)
Eosinophils Absolute: 0.3 10*3/uL (ref 0.0–0.5)
HCT: 36.7 % (ref 34.8–46.6)
HGB: 12.3 g/dL (ref 11.6–15.9)
LYMPH%: 10.5 % — ABNORMAL LOW (ref 14.0–49.7)
MCH: 31 pg (ref 25.1–34.0)
MCHC: 33.6 g/dL (ref 31.5–36.0)
MCV: 92.1 fL (ref 79.5–101.0)
MONO#: 0.5 10*3/uL (ref 0.1–0.9)
MONO%: 8.9 % (ref 0.0–14.0)
NEUT#: 4.4 10*3/uL (ref 1.5–6.5)
NEUT%: 75.6 % (ref 38.4–76.8)
Platelets: 262 10*3/uL (ref 145–400)
RBC: 3.98 10*6/uL (ref 3.70–5.45)
RDW: 14.9 % — ABNORMAL HIGH (ref 11.2–14.5)
WBC: 5.8 10*3/uL (ref 3.9–10.3)
lymph#: 0.6 10*3/uL — ABNORMAL LOW (ref 0.9–3.3)

## 2012-01-14 LAB — COMPREHENSIVE METABOLIC PANEL
ALT: 19 U/L (ref 0–35)
CO2: 25 mEq/L (ref 19–32)
Creatinine, Ser: 0.68 mg/dL (ref 0.50–1.10)
Total Bilirubin: 1 mg/dL (ref 0.3–1.2)

## 2012-01-14 NOTE — Telephone Encounter (Signed)
Gv pt appt for feb2013 °

## 2012-01-15 ENCOUNTER — Encounter: Payer: Self-pay | Admitting: *Deleted

## 2012-01-15 NOTE — Progress Notes (Signed)
No images are attached to the encounter. No scans are attached to the encounter. No scans are attached to the encounter. [facility] OFFICE PROGRESS NOTE  [name],[name], MD, MD [address] [place] [address]  DIAGNOSIS: :  Stage IIIA nonsmall cell lung cancer, adenocarcinoma, in a never smoker patient with positive EGFR mutation diagnosed in January 2010.  PRIOR THERAPY: Status post concurrent chemoradiation with weekly carboplatin and paclitaxel; last dose given March 21, 2009.  CURRENT THERAPY: Tarceva at 150 mg p.o. daily, status post 34 months of treatment.  INTERVAL HISTORY: Dana [name] 76 y.o. female returns to clinic today for a followup visit. The patient is feeling fine she denied having any significant complaints. She reports she is back to walking and swimming. Her right wrist is still somewhat sore but less inflamed. She has another followup appointment with your orthopedist on 02/28/2012 with a followup x-ray at that visit as well. MEDICAL HISTORY: Past Medical History  Diagnosis Date  . Hemorrhoid   . COPD (chronic obstructive pulmonary disease)   . Shortness of breath   . GERD (gastroesophageal reflux disease)   . Arthritis   . Cancer     lung, currently being treated with Oral med    ALLERGIES:  is allergic to amoxicillin.  MEDICATIONS:  Current Outpatient Prescriptions  Medication Sig Dispense Refill  . calcium carbonate (OS-CAL) 600 MG TABS Take 600 mg by mouth 2 (two) times daily with a meal.        . conjugated estrogens (PREMARIN) vaginal cream Place vaginally once a week.        . Cyanocobalamin (B-12) 1000 MCG CAPS Take 1,000 mcg by mouth daily.       Marland Kitchen erlotinib (TARCEVA) 150 MG tablet Take 1 tablet (150 mg total) by mouth daily. Take on an empty stomach 1 hour before meals or 2 hours after.  30 tablet  1  . HYDROcodone-acetaminophen (VICODIN) 5-500 MG per tablet       . loratadine (CLARITIN) 10 MG tablet Take 10 mg by  mouth daily.        . Multiple Vitamin (MULTIVITAMIN) tablet Take 1 tablet by mouth daily.        . nabumetone (RELAFEN) 500 MG tablet Take 250-1,000 mg by mouth daily as needed. For pain       . omeprazole (PRILOSEC) 10 MG capsule Take 20 mg by mouth daily.       Marland Kitchen tiotropium (SPIRIVA) 18 MCG inhalation capsule Place 18 mcg into inhaler and inhale daily.        Marland Kitchen VITAMIN D, ERGOCALCIFEROL, PO Take 2,000 Units by mouth daily.          SURGICAL HISTORY:  Past Surgical History  Procedure Date  . Breast surgery 02/1999    left nipple discharge/ Central duct excision  . Anal fissure repair 07/09/2011  . Knee arthroscopy     R  . Abdominal hysterectomy   . Thumb arthroscopy     L - removed bone spur  . Tonsillectomy   . Appendectomy   . US echocardiography   . Thoracoscopy     w/lung biopsy  . Cardiovascular stress test     REVIEW OF SYSTEMS:  A comprehensive review of systems was negative except for: Musculoskeletal: positive for Decreased swelling and redness of the right wrist, also less tender in general   PHYSICAL EXAMINATION: General appearance: alert, cooperative and no distress Head: Normocephalic, without obvious abnormality, atraumatic Neck: no adenopathy  Lymph nodes: Cervical, supraclavicular, and axillary nodes normal. Resp: clear to auscultation bilaterally Cardio: regular rate and rhythm, S1, S2 normal, no murmur, click, rub or gallop GI: soft, non-tender; bowel sounds normal; no masses,  no organomegaly Extremities: extremities normal, atraumatic, no cyanosis or edema Neurologic: Alert and oriented X 3, normal strength and tone. Normal symmetric reflexes. Normal coordination and gait Musculoskeletal: There is decreased soft tissue edema and erythema about the surgical his incision on the right lateral wrist , no warmth and decreased tenderness  ECOG PERFORMANCE STATUS: 0 - Asymptomatic  Blood pressure 136/77, pulse 93, temperature 97.7 F (36.5 C), temperature  source Oral, height 5' 3.5" (1.613 m), weight 129 lb 8 oz (58.741 kg).  LABORATORY DATA: Lab Results  Component Value Date   WBC 5.8 01/14/2012   HGB 12.3 01/14/2012   HCT 36.7 01/14/2012   MCV 92.1 01/14/2012   PLT 262 01/14/2012      Chemistry      Component Value Date/Time   NA 142 01/14/2012 1137   NA 145 11/09/2011 1345   K 4.0 01/14/2012 1137   K 3.9 11/09/2011 1345   CL 107 01/14/2012 1137   CL 104 11/09/2011 1345   CO2 25 01/14/2012 1137   CO2 28 11/09/2011 1345   BUN 23 01/14/2012 1137   BUN 20 11/09/2011 1345   CREATININE 0.68 01/14/2012 1137   CREATININE 0.6 11/09/2011 1345      Component Value Date/Time   CALCIUM 9.4 01/14/2012 1137   CALCIUM 8.7 11/09/2011 1345   ALKPHOS 57 01/14/2012 1137   ALKPHOS 52 11/09/2011 1345   AST 23 01/14/2012 1137   AST 20 11/09/2011 1345   ALT 19 01/14/2012 1137   BILITOT 1.0 01/14/2012 1137   BILITOT 0.60 11/09/2011 1345       RADIOGRAPHIC STUDIES: Ct Chest W Contrast  11/09/2011  *RADIOLOGY REPORT*  Clinical Data: Lung cancer follow-up  CT CHEST WITH CONTRAST  Technique:  Multidetector CT imaging of the chest was performed following the standard protocol during bolus administration of intravenous contrast.  Contrast:  80 ml of omni 300  Comparison: 08/09/2011  Findings: There are no enlarged axillary or supraclavicular lymph nodes.  There are no pathologically enlarged mediastinal or hilar lymph nodes.  There is a moderate-sized right pleural effusion.  Not changed in volume from previous exam.  No left effusion.  No pericardial effusion.  Stable nodule in the left base measuring 4.4 mm, image 41. Tiny nodule in the right upper lobe measures 3.3 mm, image 14.  Stable from previous exam.  There are no specific features to suggest residual or recurrence of tumor.  IMPRESSION:  1.  Stable CT of the chest. 2.  No significant change in right pleural effusion.  Original Report Authenticated By: Rosealee Albee, M.D.    ASSESSMENT/PLAN: This is a  very pleasant 77 year old white female with a history of stage III nonsmall cell lung cancer with positive EGFR mutation, now status post 33 months of treatment with Tarceva at 150 mg p.o. daily.  The patient is tolerating her Tarceva generally well.  She has no evidence for disease recurrence on recent scan. Patient was discussed with Dr. Arbutus Ped. She will continue on her current dose of Tarceva and return in one month with a repeat CBC differential and C. met for reevaluation. We will plan to do a restaging scan in March per the patient's preference and as approved by Dr. Arbutus Ped.   Conni Slipper, PA-C    All  questions were answered. The patient knows to call the clinic with any problems, questions or concerns. We can certainly see the patient much sooner if necessary.

## 2012-01-15 NOTE — Progress Notes (Signed)
RECEIVED A FAX FROM [facility] CONCERNING A CONFIRMATION OF PRESCRIPTION SHIPMENT FOR TARCEVA. 

## 2012-02-11 ENCOUNTER — Other Ambulatory Visit (HOSPITAL_BASED_OUTPATIENT_CLINIC_OR_DEPARTMENT_OTHER): Payer: Medicare Other | Admitting: Lab

## 2012-02-11 ENCOUNTER — Encounter: Payer: Self-pay | Admitting: Physician Assistant

## 2012-02-11 ENCOUNTER — Ambulatory Visit (HOSPITAL_BASED_OUTPATIENT_CLINIC_OR_DEPARTMENT_OTHER): Payer: Medicare Other | Admitting: Physician Assistant

## 2012-02-11 VITALS — BP 145/85 | HR 94 | Temp 97.4°F | Ht 63.5 in | Wt 131.0 lb

## 2012-02-11 DIAGNOSIS — C3490 Malignant neoplasm of unspecified part of unspecified bronchus or lung: Secondary | ICD-10-CM

## 2012-02-11 LAB — COMPREHENSIVE METABOLIC PANEL
ALT: 16 U/L (ref 0–35)
AST: 21 U/L (ref 0–37)
Creatinine, Ser: 0.73 mg/dL (ref 0.50–1.10)
Total Bilirubin: 0.7 mg/dL (ref 0.3–1.2)

## 2012-02-11 LAB — CBC WITH DIFFERENTIAL/PLATELET
BASO%: 0.3 % (ref 0.0–2.0)
EOS%: 6.1 % (ref 0.0–7.0)
HCT: 36.4 % (ref 34.8–46.6)
LYMPH%: 11.1 % — ABNORMAL LOW (ref 14.0–49.7)
MCH: 31.9 pg (ref 25.1–34.0)
MCHC: 34.4 g/dL (ref 31.5–36.0)
MCV: 92.8 fL (ref 79.5–101.0)
NEUT%: 74.4 % (ref 38.4–76.8)
Platelets: 245 10*3/uL (ref 145–400)

## 2012-02-11 NOTE — Progress Notes (Signed)
No images are attached to the encounter. No scans are attached to the encounter. No scans are attached to the encounter. [facility] OFFICE PROGRESS NOTE  [name],[name], MD, MD [address] [place] [address]  DIAGNOSIS: :  Stage IIIA nonsmall cell lung cancer, adenocarcinoma, in a never smoker patient with positive EGFR mutation diagnosed in January 2010.  PRIOR THERAPY: Status post concurrent chemoradiation with weekly carboplatin and paclitaxel; last dose given March 21, 2009.  CURRENT THERAPY: Tarceva at 150 mg p.o. daily, status post 34 months of treatment.  INTERVAL HISTORY: Dana [name] 77 y.o. female returns to clinic today for a followup visit. The patient is feeling fine she denied having any significant complaints. She complains of dry skin. She also has dry eyes. Her friend first of his told her about a dry eye studies and an ophthalmologist near [facility] is running. She is going to look into it although her cancer diagnosis may make her ineligible. She reports a 2 day episode where she had blood in her stool. She denied any pain during that time period. She did use Preparation H suppository with total resolution of the symptoms. She has not noted any other blood. She previously had a colonoscopy performed by Dr. Dulce Sellar in the [facility]. She voiced no complaints.  MEDICAL HISTORY: Past Medical History  Diagnosis Date  . Hemorrhoid   . COPD (chronic obstructive pulmonary disease)   . Shortness of breath   . GERD (gastroesophageal reflux disease)   . Arthritis   . Cancer     lung, currently being treated with Oral med    ALLERGIES:  is allergic to amoxicillin.  MEDICATIONS:  Current Outpatient Prescriptions  Medication Sig Dispense Refill  . calcium carbonate (OS-CAL) 600 MG TABS Take 600 mg by mouth 2 (two) times daily with a meal.        . conjugated estrogens (PREMARIN) vaginal cream Place vaginally once a week.        .  Cyanocobalamin (B-12) 1000 MCG CAPS Take 1,000 mcg by mouth daily.       Marland Kitchen erlotinib (TARCEVA) 150 MG tablet Take 1 tablet (150 mg total) by mouth daily. Take on an empty stomach 1 hour before meals or 2 hours after.  30 tablet  1  . HYDROcodone-acetaminophen (VICODIN) 5-500 MG per tablet       . loratadine (CLARITIN) 10 MG tablet Take 10 mg by mouth daily.        . Multiple Vitamin (MULTIVITAMIN) tablet Take 1 tablet by mouth daily.        . nabumetone (RELAFEN) 500 MG tablet Take 250-1,000 mg by mouth daily as needed. For pain       . omeprazole (PRILOSEC) 10 MG capsule Take 20 mg by mouth daily.       Marland Kitchen tiotropium (SPIRIVA) 18 MCG inhalation capsule Place 18 mcg into inhaler and inhale daily.        Marland Kitchen VITAMIN D, ERGOCALCIFEROL, PO Take 2,000 Units by mouth daily.          SURGICAL HISTORY:  Past Surgical History  Procedure Date  . Breast surgery 02/1999    left nipple discharge/ Central duct excision  . Anal fissure repair 07/09/2011  . Knee arthroscopy     R  . Abdominal hysterectomy   . Thumb arthroscopy     L - removed bone spur  . Tonsillectomy   . Appendectomy   . US echocardiography   .  Thoracoscopy     w/lung biopsy  . Cardiovascular stress test     REVIEW OF SYSTEMS:  A comprehensive review of systems was negative except for: Eyes: positive for Dry eyes Gastrointestinal: positive for 2 day episode of blood in her stool that resolved after using preparation H. suppositories Integument/breast: positive for dryness   PHYSICAL EXAMINATION: General appearance: alert, cooperative and no distress Head: Normocephalic, without obvious abnormality, atraumatic Neck: no adenopathy Lymph nodes: Cervical, supraclavicular, and axillary nodes normal. Resp: clear to auscultation bilaterally Cardio: regular rate and rhythm, S1, S2 normal, no murmur, click, rub or gallop GI: soft, non-tender; bowel sounds normal; no masses,  no organomegaly Extremities: extremities normal, atraumatic,  no cyanosis or edema Neurologic: Alert and oriented X 3, normal strength and tone. Normal symmetric reflexes. Normal coordination and gait Musculoskeletal: There is decreased soft tissue edema and erythema about the surgical his incision on the right lateral wrist , no warmth and decreased tenderness  ECOG PERFORMANCE STATUS: 0 - Asymptomatic  Blood pressure 145/85, pulse 94, temperature 97.4 F (36.3 C), temperature source Oral, height 5' 3.5" (1.613 m), weight 131 lb (59.421 kg).  LABORATORY DATA: Lab Results  Component Value Date   WBC 5.0 02/11/2012   HGB 12.5 02/11/2012   HCT 36.4 02/11/2012   MCV 92.8 02/11/2012   PLT 245 02/11/2012      Chemistry      Component Value Date/Time   NA 142 01/14/2012 1137   NA 145 11/09/2011 1345   K 4.0 01/14/2012 1137   K 3.9 11/09/2011 1345   CL 107 01/14/2012 1137   CL 104 11/09/2011 1345   CO2 25 01/14/2012 1137   CO2 28 11/09/2011 1345   BUN 23 01/14/2012 1137   BUN 20 11/09/2011 1345   CREATININE 0.68 01/14/2012 1137   CREATININE 0.6 11/09/2011 1345      Component Value Date/Time   CALCIUM 9.4 01/14/2012 1137   CALCIUM 8.7 11/09/2011 1345   ALKPHOS 57 01/14/2012 1137   ALKPHOS 52 11/09/2011 1345   AST 23 01/14/2012 1137   AST 20 11/09/2011 1345   ALT 19 01/14/2012 1137   BILITOT 1.0 01/14/2012 1137   BILITOT 0.60 11/09/2011 1345       RADIOGRAPHIC STUDIES: Ct Chest W Contrast  11/09/2011  *RADIOLOGY REPORT*  Clinical Data: Lung cancer follow-up  CT CHEST WITH CONTRAST  Technique:  Multidetector CT imaging of the chest was performed following the standard protocol during bolus administration of intravenous contrast.  Contrast:  80 ml of omni 300  Comparison: 08/09/2011  Findings: There are no enlarged axillary or supraclavicular lymph nodes.  There are no pathologically enlarged mediastinal or hilar lymph nodes.  There is a moderate-sized right pleural effusion.  Not changed in volume from previous exam.  No left effusion.  No pericardial  effusion.  Stable nodule in the left base measuring 4.4 mm, image 41. Tiny nodule in the right upper lobe measures 3.3 mm, image 14.  Stable from previous exam.  There are no specific features to suggest residual or recurrence of tumor.  IMPRESSION:  1.  Stable CT of the chest. 2.  No significant change in right pleural effusion.  Original Report Authenticated By: Rosealee Albee, M.D.    ASSESSMENT/PLAN: This is a very pleasant 77 year old white female with a history of stage III nonsmall cell lung cancer with positive EGFR mutation, now status post 34 months of treatment with Tarceva at 150 mg p.o. daily.  The patient is tolerating  her Tarceva generally well.  She has no evidence for disease recurrence on recent scan. Patient was discussed with Dr. Arbutus Ped. She will continue on her current dose of Tarceva and followup with Dr. Arbutus Ped in one month with a repeat CBC differential and C. met for reevaluation as well as a CT of the chest with contrast to reevaluate her disease. We'll try some Aquaphor for her dry skin. She'll keep [place] informed about the ophthalmology study. She is strongly advised to followup with her gastroenterologist should she have a repeat of the blood in her stool.   Laural Benes, Dmari Schubring E, PA-C    All questions were answered. The patient knows to call the clinic with any problems, questions or concerns. We can certainly see the patient much sooner if necessary.

## 2012-02-15 ENCOUNTER — Telehealth: Payer: Self-pay | Admitting: Internal Medicine

## 2012-02-15 NOTE — Telephone Encounter (Signed)
Faxed signed refill for [facility]ceva to [facility]

## 2012-02-18 ENCOUNTER — Telehealth: Payer: Self-pay | Admitting: Internal Medicine

## 2012-02-18 ENCOUNTER — Encounter: Payer: Self-pay | Admitting: *Deleted

## 2012-02-18 NOTE — Telephone Encounter (Signed)
Tarceva was shipped on 2/22 and will arrive next business day.

## 2012-02-18 NOTE — Progress Notes (Signed)
RECEIVED A FAX FROM [facility] CONCERNING A CONFIRMATION OF FACSIMILE RECEIPT. 

## 2012-03-06 ENCOUNTER — Ambulatory Visit (HOSPITAL_COMMUNITY)
Admission: RE | Admit: 2012-03-06 | Discharge: 2012-03-06 | Disposition: A | Discharge status: Home / Self Care | Payer: Medicare Other | Source: Ambulatory Visit | Attending: Physician Assistant | Admitting: Physician Assistant

## 2012-03-06 ENCOUNTER — Encounter (HOSPITAL_COMMUNITY): Payer: Self-pay

## 2012-03-06 DIAGNOSIS — C3490 Malignant neoplasm of unspecified part of unspecified bronchus or lung: Secondary | ICD-10-CM | POA: Insufficient documentation

## 2012-03-06 DIAGNOSIS — R911 Solitary pulmonary nodule: Secondary | ICD-10-CM | POA: Insufficient documentation

## 2012-03-06 MED ORDER — IOHEXOL 300 MG/ML  SOLN
80.0000 mL | Freq: Once | INTRAMUSCULAR | Status: AC | PRN
  Administered 2012-03-06: 80 mL via INTRAVENOUS

## 2012-03-10 ENCOUNTER — Telehealth: Payer: Self-pay | Admitting: Internal Medicine

## 2012-03-10 ENCOUNTER — Ambulatory Visit (HOSPITAL_BASED_OUTPATIENT_CLINIC_OR_DEPARTMENT_OTHER): Payer: Medicare Other | Admitting: Internal Medicine

## 2012-03-10 ENCOUNTER — Other Ambulatory Visit (HOSPITAL_BASED_OUTPATIENT_CLINIC_OR_DEPARTMENT_OTHER): Payer: Medicare Other | Admitting: Lab

## 2012-03-10 VITALS — BP 151/91 | HR 85 | Temp 96.8°F | Ht 63.5 in | Wt 130.4 lb

## 2012-03-10 DIAGNOSIS — C3490 Malignant neoplasm of unspecified part of unspecified bronchus or lung: Secondary | ICD-10-CM

## 2012-03-10 DIAGNOSIS — C342 Malignant neoplasm of middle lobe, bronchus or lung: Secondary | ICD-10-CM

## 2012-03-10 LAB — COMPREHENSIVE METABOLIC PANEL
ALT: 17 U/L (ref 0–35)
BUN: 17 mg/dL (ref 6–23)
CO2: 24 mEq/L (ref 19–32)
Calcium: 9.1 mg/dL (ref 8.4–10.5)
Creatinine, Ser: 0.61 mg/dL (ref 0.50–1.10)
Glucose, Bld: 86 mg/dL (ref 70–99)
Total Bilirubin: 0.9 mg/dL (ref 0.3–1.2)

## 2012-03-10 LAB — CBC WITH DIFFERENTIAL/PLATELET
BASO%: 0.2 % (ref 0.0–2.0)
Basophils Absolute: 0 10*3/uL (ref 0.0–0.1)
HCT: 36.9 % (ref 34.8–46.6)
HGB: 12.5 g/dL (ref 11.6–15.9)
LYMPH%: 11 % — ABNORMAL LOW (ref 14.0–49.7)
MCHC: 34 g/dL (ref 31.5–36.0)
MONO#: 0.4 10*3/uL (ref 0.1–0.9)
NEUT%: 79.3 % — ABNORMAL HIGH (ref 38.4–76.8)
Platelets: 245 10*3/uL (ref 145–400)
WBC: 6.1 10*3/uL (ref 3.9–10.3)

## 2012-03-10 NOTE — Progress Notes (Signed)
[facility] Telephone:(336) [phone]   Fax:([phone]  OFFICE PROGRESS NOTE  [name],[name], MD, MD [address] [place] [address]  DIAGNOSIS: : Stage IIIA nonsmall cell lung cancer, adenocarcinoma, in a never smoker patient with positive EGFR mutation diagnosed in January 2010.   PRIOR THERAPY: Status post concurrent chemoradiation with weekly carboplatin and paclitaxel; last dose given March 21, 2009.   CURRENT THERAPY: Tarceva at 150 mg p.o. daily, status post 35 months of treatment.   INTERVAL HISTORY: Dana [name] 77 y.o. female returns to the clinic today for followup visit accompanied her husband. The patient has no complaints today. She denied having any significant chest pain or shortness breath, no cough or hemoptysis. She has no weight loss or night sweats. She is tolerating her treatment with Tarceva fairly well with no significant skin rash or diarrhea. The patient has repeat CT scan of the chest performed recently and she is here today for evaluation and discussion of her scan results.   MEDICAL HISTORY: Past Medical History  Diagnosis Date  . Hemorrhoid   . COPD (chronic obstructive pulmonary disease)   . Shortness of breath   . GERD (gastroesophageal reflux disease)   . Arthritis   . Cancer     lung, currently being treated with Oral med    ALLERGIES:  is allergic to amoxicillin.  MEDICATIONS:  Current Outpatient Prescriptions  Medication Sig Dispense Refill  . calcium carbonate (OS-CAL) 600 MG TABS Take 600 mg by mouth 2 (two) times daily with a meal.        . conjugated estrogens (PREMARIN) vaginal cream Place vaginally once a week.        . Cyanocobalamin (B-12) 1000 MCG CAPS Take 1,000 mcg by mouth daily.       Marland Kitchen erlotinib (TARCEVA) 150 MG tablet Take 1 tablet (150 mg total) by mouth daily. Take on an empty stomach 1 hour before meals or 2 hours after.  30 tablet  1  . loratadine (CLARITIN) 10 MG tablet Take 10  mg by mouth daily.        . Multiple Vitamin (MULTIVITAMIN) tablet Take 1 tablet by mouth daily.        . nabumetone (RELAFEN) 500 MG tablet Take 250-1,000 mg by mouth daily as needed. For pain       . omeprazole (PRILOSEC) 10 MG capsule Take 20 mg by mouth daily.       Marland Kitchen tiotropium (SPIRIVA) 18 MCG inhalation capsule Place 18 mcg into inhaler and inhale daily.        Marland Kitchen VITAMIN D, ERGOCALCIFEROL, PO Take 2,000 Units by mouth daily.        Marland Kitchen HYDROcodone-acetaminophen (VICODIN) 5-500 MG per tablet         SURGICAL HISTORY:  Past Surgical History  Procedure Date  . Breast surgery 02/1999    left nipple discharge/ Central duct excision  . Anal fissure repair 07/09/2011  . Knee arthroscopy     R  . Abdominal hysterectomy   . Thumb arthroscopy     L - removed bone spur  . Tonsillectomy   . Appendectomy   . US echocardiography   . Thoracoscopy     w/lung biopsy  . Cardiovascular stress test     REVIEW OF SYSTEMS:  A comprehensive review of systems was negative.   PHYSICAL EXAMINATION: General appearance: alert, cooperative and no distress Neck: no adenopathy Lymph nodes: Cervical, supraclavicular, and axillary nodes normal.  Resp: clear to auscultation bilaterally Cardio: regular rate and rhythm, S1, S2 normal, no murmur, click, rub or gallop GI: soft, non-tender; bowel sounds normal; no masses,  no organomegaly Extremities: extremities normal, atraumatic, no cyanosis or edema Neurologic: Alert and oriented X 3, normal strength and tone. Normal symmetric reflexes. Normal coordination and gait  ECOG PERFORMANCE STATUS: 0 - Asymptomatic  Blood pressure 151/91, pulse 85, temperature 96.8 F (36 C), temperature source Oral, height 5' 3.5" (1.613 m), weight 130 lb 6.4 oz (59.149 kg).  LABORATORY DATA: Lab Results  Component Value Date   WBC 6.1 03/10/2012   HGB 12.5 03/10/2012   HCT 36.9 03/10/2012   MCV 93.3 03/10/2012   PLT 245 03/10/2012      Chemistry      Component Value  Date/Time   NA 144 02/11/2012 0959   NA 145 11/09/2011 1345   K 3.7 02/11/2012 0959   K 3.9 11/09/2011 1345   CL 108 02/11/2012 0959   CL 104 11/09/2011 1345   CO2 22 02/11/2012 0959   CO2 28 11/09/2011 1345   BUN 17 02/11/2012 0959   BUN 20 11/09/2011 1345   CREATININE 0.73 02/11/2012 0959   CREATININE 0.6 11/09/2011 1345      Component Value Date/Time   CALCIUM 8.7 02/11/2012 0959   CALCIUM 8.7 11/09/2011 1345   ALKPHOS 58 02/11/2012 0959   ALKPHOS 52 11/09/2011 1345   AST 21 02/11/2012 0959   AST 20 11/09/2011 1345   ALT 16 02/11/2012 0959   BILITOT 0.7 02/11/2012 0959   BILITOT 0.60 11/09/2011 1345       RADIOGRAPHIC STUDIES: Ct Chest W Contrast  03/06/2012  *RADIOLOGY REPORT*  Clinical Data: Lung cancer diagnosed in 2010.  Chemotherapy and radiation therapy.  Shortness of breath.  CT CHEST WITH CONTRAST  Technique:  Multidetector CT imaging of the chest was performed following the standard protocol during bolus administration of intravenous contrast.  Contrast: 80mL OMNIPAQUE IOHEXOL 300 MG/ML IJ SOLN  Comparison: Plain film of 11/21/2011.  Most recent CT of 11/09/2011.  Findings: Lung windows demonstrate possible nodular density at the right lung base medially.  This measures 4 mm on image 47 and is unchanged. Volume loss within the medial posterior super segment right lower lobe with bronchiectasis within.  Example image 32 and 33.  This is unchanged. Stable posterior right upper lobe 3 mm subpleural nodule on image 22. No new or enlarging nodules. Vague ground-glass nodule at the right apex at 3 mm on image 15 is unchanged. Left lower lobe nodule described previously is less apparent today on image 43.  Soft tissue windows demonstrate no supraclavicular adenopathy. Normal heart size with trace pericardial fluid or thickening.  A moderate right-sided pleural effusion is similar.  No evidence of pleural enhancement or nodularity to suggest pleural disease/metastasis.  No central pulmonary  embolism, on this non-dedicated study.  7 mm precarinal node is unchanged.  Small subcarinal/azygo-esophageal recess nodes are unchanged. Not pathologic by size criteria.  No hilar adenopathy.  Limited abdominal imaging demonstrates mild hepatic steatosis. Normal adrenal glands.  A retroaortic left renal vein. No acute osseous abnormality.  Degenerative disc disease at the L1-L2 level is advanced.  IMPRESSION:  1. No acute process or evidence of metastatic disease in the chest. 2.  Radiation changes within the right lung.  Stable moderate right pleural effusion. 3.  Stable tiny bilateral pulmonary nodules.  Original Report Authenticated By: Consuello Bossier, M.D.    ASSESSMENT: This is a very  pleasant 77 years old white female with a stage IIIa non-small cell lung cancer currently on treatment with Tarceva 150 mg by mouth daily status post 35 months of treatment. Patient has no evidence for disease progression on the recent scan.  PLAN: I discussed the scan results with the patient and her husband. I recommended for her to continue on Tarceva with the current dose. She will come back for followup visit in one month for reevaluation and management any adverse effect of her treatment. The patient was advised to call me immediately if she has any concerning symptoms in the interval.  All questions were answered. The patient knows to call the clinic with any problems, questions or concerns. We can certainly see the patient much sooner if necessary.

## 2012-03-10 NOTE — Telephone Encounter (Signed)
gv pt appt for april2013 

## 2012-03-20 ENCOUNTER — Encounter: Payer: Self-pay | Admitting: *Deleted

## 2012-03-20 NOTE — Progress Notes (Signed)
RECEIVED A FAX FROM [facility] CONCERNING A CONFIRMATION OF PRESCRIPTION SHIPMENT FOR TARCEVA. 

## 2012-03-25 ENCOUNTER — Other Ambulatory Visit: Payer: Self-pay | Admitting: Family Medicine

## 2012-03-25 DIAGNOSIS — Z1231 Encounter for screening mammogram for malignant neoplasm of breast: Secondary | ICD-10-CM

## 2012-04-02 ENCOUNTER — Other Ambulatory Visit: Payer: Self-pay

## 2012-04-10 ENCOUNTER — Encounter: Payer: Self-pay | Admitting: Physician Assistant

## 2012-04-10 ENCOUNTER — Other Ambulatory Visit (HOSPITAL_BASED_OUTPATIENT_CLINIC_OR_DEPARTMENT_OTHER): Payer: Medicare Other | Admitting: Lab

## 2012-04-10 ENCOUNTER — Ambulatory Visit (HOSPITAL_BASED_OUTPATIENT_CLINIC_OR_DEPARTMENT_OTHER): Payer: Medicare Other | Admitting: Physician Assistant

## 2012-04-10 ENCOUNTER — Telehealth: Payer: Self-pay | Admitting: Internal Medicine

## 2012-04-10 VITALS — BP 115/74 | HR 85 | Temp 97.8°F | Ht 63.5 in | Wt 130.8 lb

## 2012-04-10 DIAGNOSIS — C3490 Malignant neoplasm of unspecified part of unspecified bronchus or lung: Secondary | ICD-10-CM

## 2012-04-10 LAB — CBC WITH DIFFERENTIAL/PLATELET
Basophils Absolute: 0 10*3/uL (ref 0.0–0.1)
Eosinophils Absolute: 0.3 10*3/uL (ref 0.0–0.5)
HCT: 35.6 % (ref 34.8–46.6)
HGB: 11.9 g/dL (ref 11.6–15.9)
LYMPH%: 11.7 % — ABNORMAL LOW (ref 14.0–49.7)
MONO#: 0.5 10*3/uL (ref 0.1–0.9)
NEUT#: 3.9 10*3/uL (ref 1.5–6.5)
NEUT%: 74 % (ref 38.4–76.8)
Platelets: 249 10*3/uL (ref 145–400)
WBC: 5.3 10*3/uL (ref 3.9–10.3)
lymph#: 0.6 10*3/uL — ABNORMAL LOW (ref 0.9–3.3)

## 2012-04-10 LAB — COMPREHENSIVE METABOLIC PANEL
AST: 21 U/L (ref 0–37)
Albumin: 3.5 g/dL (ref 3.5–5.2)
BUN: 19 mg/dL (ref 6–23)
CO2: 25 mEq/L (ref 19–32)
Calcium: 8.5 mg/dL (ref 8.4–10.5)
Chloride: 108 mEq/L (ref 96–112)
Creatinine, Ser: 0.72 mg/dL (ref 0.50–1.10)
Potassium: 4.2 mEq/L (ref 3.5–5.3)

## 2012-04-10 MED ORDER — LORAZEPAM 0.5 MG PO TABS: 0.5000 mg | ORAL_TABLET | Freq: Three times a day (TID) | ORAL | Status: DC | PRN

## 2012-04-10 NOTE — Telephone Encounter (Signed)
gv pt appt schedule for may.  °

## 2012-04-10 NOTE — Progress Notes (Signed)
[facility] Telephone:(336) [phone]   Fax:(336) [phone]  OFFICE PROGRESS NOTE  [name],[name], MD, MD [address] [place] [address]  DIAGNOSIS: : Stage IIIA nonsmall cell lung cancer, adenocarcinoma, in a never smoker patient with positive EGFR mutation diagnosed in January 2010.   PRIOR THERAPY: Status post concurrent chemoradiation with weekly carboplatin and paclitaxel; last dose given March 21, 2009.   CURRENT THERAPY: Tarceva at 150 mg p.o. daily, status post 36 months of treatment.   INTERVAL HISTORY: Dana [name] 77 y.o. female returns to the clinic today for followup visit. She reports she reports that she's had a "bad month". Shortly after her last visit she had lunch with some friends of hers and 8 a salad. Shortly thereafter she developed intermittent diarrhea that lasted approximately 3 weeks. She used Imodium, ingested rice and Pedialyte with resolution of her symptoms. Today she feels well and voices no specific complaints. She denied any significant chest pain or shortness of breath and in fact is able to be more active on a daily basis when she compares herself to this time last year. 9 in the cough or hemoptysis and has had no significant weight loss or night sweats. She's had some dry skin related to the Tarceva and is currently using Bio-oil with good results. She requests a refill for her lorazepam, last filled in 2010, that she takes for intermittent anxiety.   MEDICAL HISTORY: Past Medical History  Diagnosis Date  . Hemorrhoid   . COPD (chronic obstructive pulmonary disease)   . Shortness of breath   . GERD (gastroesophageal reflux disease)   . Arthritis   . Cancer     lung, currently being treated with Oral med    ALLERGIES:  is allergic to amoxicillin.  MEDICATIONS:  Current Outpatient Prescriptions  Medication Sig Dispense Refill  . LORazepam (ATIVAN) 0.5 MG tablet Take 1 tablet (0.5 mg total) by mouth every 8  (eight) hours as needed.  30 tablet  0  . calcium carbonate (OS-CAL) 600 MG TABS Take 600 mg by mouth 2 (two) times daily with a meal.        . conjugated estrogens (PREMARIN) vaginal cream Place vaginally once a week.        . Cyanocobalamin (B-12) 1000 MCG CAPS Take 1,000 mcg by mouth daily.       Marland Kitchen erlotinib (TARCEVA) 150 MG tablet Take 1 tablet (150 mg total) by mouth daily. Take on an empty stomach 1 hour before meals or 2 hours after.  30 tablet  1  . HYDROcodone-acetaminophen (VICODIN) 5-500 MG per tablet       . loratadine (CLARITIN) 10 MG tablet Take 10 mg by mouth daily.        . Multiple Vitamin (MULTIVITAMIN) tablet Take 1 tablet by mouth daily.        . nabumetone (RELAFEN) 500 MG tablet Take 250-1,000 mg by mouth daily as needed. For pain       . omeprazole (PRILOSEC) 10 MG capsule Take 20 mg by mouth daily.       Marland Kitchen tiotropium (SPIRIVA) 18 MCG inhalation capsule Place 18 mcg into inhaler and inhale daily.        Marland Kitchen VITAMIN D, ERGOCALCIFEROL, PO Take 2,000 Units by mouth daily.          SURGICAL HISTORY:  Past Surgical History  Procedure Date  . Breast surgery 02/1999    left nipple discharge/ Central duct excision  .  Anal fissure repair 07/09/2011  . Knee arthroscopy     R  . Abdominal hysterectomy   . Thumb arthroscopy     L - removed bone spur  . Tonsillectomy   . Appendectomy   . US echocardiography   . Thoracoscopy     w/lung biopsy  . Cardiovascular stress test     REVIEW OF SYSTEMS:  Pertinent items are noted in HPI.   PHYSICAL EXAMINATION: General appearance: alert, cooperative and no distress Neck: no adenopathy Lymph nodes: Cervical, supraclavicular, and axillary nodes normal. Resp: clear to auscultation bilaterally Cardio: regular rate and rhythm, S1, S2 normal, no murmur, click, rub or gallop GI: soft, non-tender; bowel sounds normal; no masses,  no organomegaly Extremities: extremities normal, atraumatic, no cyanosis or edema Neurologic: Alert and  oriented X 3, normal strength and tone. Normal symmetric reflexes. Normal coordination and gait  ECOG PERFORMANCE STATUS: 0 - Asymptomatic  Blood pressure 115/74, pulse 85, temperature 97.8 F (36.6 C), temperature source Oral, height 5' 3.5" (1.613 m), weight 130 lb 12.8 oz (59.33 kg).  LABORATORY DATA: Lab Results  Component Value Date   WBC 5.3 04/10/2012   HGB 11.9 04/10/2012   HCT 35.6 04/10/2012   MCV 93.3 04/10/2012   PLT 249 04/10/2012      Chemistry      Component Value Date/Time   NA 141 03/10/2012 1053   NA 145 11/09/2011 1345   K 4.1 03/10/2012 1053   K 3.9 11/09/2011 1345   CL 108 03/10/2012 1053   CL 104 11/09/2011 1345   CO2 24 03/10/2012 1053   CO2 28 11/09/2011 1345   BUN 17 03/10/2012 1053   BUN 20 11/09/2011 1345   CREATININE 0.61 03/10/2012 1053   CREATININE 0.6 11/09/2011 1345      Component Value Date/Time   CALCIUM 9.1 03/10/2012 1053   CALCIUM 8.7 11/09/2011 1345   ALKPHOS 63 03/10/2012 1053   ALKPHOS 52 11/09/2011 1345   AST 21 03/10/2012 1053   AST 20 11/09/2011 1345   ALT 17 03/10/2012 1053   BILITOT 0.9 03/10/2012 1053   BILITOT 0.60 11/09/2011 1345       RADIOGRAPHIC STUDIES: Ct Chest W Contrast  03/06/2012  *RADIOLOGY REPORT*  Clinical Data: Lung cancer diagnosed in 2010.  Chemotherapy and radiation therapy.  Shortness of breath.  CT CHEST WITH CONTRAST  Technique:  Multidetector CT imaging of the chest was performed following the standard protocol during bolus administration of intravenous contrast.  Contrast: 80mL OMNIPAQUE IOHEXOL 300 MG/ML IJ SOLN  Comparison: Plain film of 11/21/2011.  Most recent CT of 11/09/2011.  Findings: Lung windows demonstrate possible nodular density at the right lung base medially.  This measures 4 mm on image 47 and is unchanged. Volume loss within the medial posterior super segment right lower lobe with bronchiectasis within.  Example image 32 and 33.  This is unchanged. Stable posterior right upper lobe 3 mm subpleural  nodule on image 22. No new or enlarging nodules. Vague ground-glass nodule at the right apex at 3 mm on image 15 is unchanged. Left lower lobe nodule described previously is less apparent today on image 43.  Soft tissue windows demonstrate no supraclavicular adenopathy. Normal heart size with trace pericardial fluid or thickening.  A moderate right-sided pleural effusion is similar.  No evidence of pleural enhancement or nodularity to suggest pleural disease/metastasis.  No central pulmonary embolism, on this non-dedicated study.  7 mm precarinal node is unchanged.  Small subcarinal/azygo-esophageal recess nodes are  unchanged. Not pathologic by size criteria.  No hilar adenopathy.  Limited abdominal imaging demonstrates mild hepatic steatosis. Normal adrenal glands.  A retroaortic left renal vein. No acute osseous abnormality.  Degenerative disc disease at the L1-L2 level is advanced.  IMPRESSION:  1. No acute process or evidence of metastatic disease in the chest. 2.  Radiation changes within the right lung.  Stable moderate right pleural effusion. 3.  Stable tiny bilateral pulmonary nodules.  Original Report Authenticated By: Consuello Bossier, M.D.    ASSESSMENT/PLAN: This is a very pleasant 77 years old white female with a stage IIIa non-small cell lung cancer currently on treatment with Tarceva 150 mg by mouth daily status post 35 months of treatment. Patient has no evidence for disease progression on the recent scan. The patient was discussed with Dr. Arbutus Ped. She will continue on her Tarceva at 150 mg by mouth daily. She was given a prescription for lorazepam 0.5 mg tablets, one tablet by mouth every 8 hours as needed for anxiety a total of 30 with no refill. She will return in one month with a repeat CBC differential and C. met for another symptom management visit.  Laural Benes, Daijon Wenke E, PA-C   All questions were answered. The patient knows to call the clinic with any problems, questions or concerns. We  can certainly see the patient much sooner if necessary.

## 2012-04-14 ENCOUNTER — Other Ambulatory Visit: Payer: Self-pay | Admitting: *Deleted

## 2012-04-14 ENCOUNTER — Encounter: Payer: Self-pay | Admitting: *Deleted

## 2012-04-14 DIAGNOSIS — C3490 Malignant neoplasm of unspecified part of unspecified bronchus or lung: Secondary | ICD-10-CM

## 2012-04-14 MED ORDER — ERLOTINIB HCL 150 MG PO TABS: 150.0000 mg | ORAL_TABLET | Freq: Every day | ORAL | Status: DC

## 2012-04-14 NOTE — Progress Notes (Signed)
[facility] faxed refill request for Tarceva. Request to MD for review.

## 2012-04-15 ENCOUNTER — Encounter: Payer: Self-pay | Admitting: *Deleted

## 2012-04-15 NOTE — Progress Notes (Signed)
RECEIVED A FAX FROM [facility] CONCERNING A CONFIRMATION OF PRESCRIPTION SHIPMENT FOR TARCEVA. 

## 2012-04-17 ENCOUNTER — Encounter: Payer: Self-pay | Admitting: *Deleted

## 2012-04-17 NOTE — Progress Notes (Signed)
Report of dermatopathology given to Dr Donnald Garre to review.  SLJ

## 2012-04-30 ENCOUNTER — Ambulatory Visit
Admission: RE | Admit: 2012-04-30 | Discharge: 2012-04-30 | Disposition: A | Discharge status: Home / Self Care | Payer: Medicare Other | Source: Ambulatory Visit | Attending: Family Medicine | Admitting: Family Medicine

## 2012-04-30 DIAGNOSIS — Z1231 Encounter for screening mammogram for malignant neoplasm of breast: Secondary | ICD-10-CM

## 2012-05-08 ENCOUNTER — Ambulatory Visit (HOSPITAL_BASED_OUTPATIENT_CLINIC_OR_DEPARTMENT_OTHER): Payer: Medicare Other | Admitting: Physician Assistant

## 2012-05-08 ENCOUNTER — Encounter: Payer: Self-pay | Admitting: Physician Assistant

## 2012-05-08 ENCOUNTER — Other Ambulatory Visit (HOSPITAL_BASED_OUTPATIENT_CLINIC_OR_DEPARTMENT_OTHER): Payer: Medicare Other | Admitting: Lab

## 2012-05-08 ENCOUNTER — Telehealth: Payer: Self-pay | Admitting: *Deleted

## 2012-05-08 VITALS — BP 125/81 | HR 93 | Temp 97.1°F | Ht 63.5 in | Wt 128.6 lb

## 2012-05-08 DIAGNOSIS — C3490 Malignant neoplasm of unspecified part of unspecified bronchus or lung: Secondary | ICD-10-CM

## 2012-05-08 DIAGNOSIS — M5430 Sciatica, unspecified side: Secondary | ICD-10-CM

## 2012-05-08 LAB — CBC WITH DIFFERENTIAL/PLATELET
BASO%: 0.2 % (ref 0.0–2.0)
Basophils Absolute: 0 10*3/uL (ref 0.0–0.1)
EOS%: 5.9 % (ref 0.0–7.0)
HGB: 12.5 g/dL (ref 11.6–15.9)
MCH: 31.3 pg (ref 25.1–34.0)
MONO%: 9.9 % (ref 0.0–14.0)
NEUT#: 3.7 10*3/uL (ref 1.5–6.5)
RBC: 4 10*6/uL (ref 3.70–5.45)
RDW: 14.6 % — ABNORMAL HIGH (ref 11.2–14.5)
lymph#: 0.6 10*3/uL — ABNORMAL LOW (ref 0.9–3.3)
nRBC: 0 % (ref 0–0)

## 2012-05-08 LAB — COMPREHENSIVE METABOLIC PANEL
ALT: 17 U/L (ref 0–35)
AST: 23 U/L (ref 0–37)
Albumin: 3.7 g/dL (ref 3.5–5.2)
Alkaline Phosphatase: 54 U/L (ref 39–117)
Glucose, Bld: 70 mg/dL (ref 70–99)
Potassium: 4.5 mEq/L (ref 3.5–5.3)
Sodium: 138 mEq/L (ref 135–145)
Total Bilirubin: 0.6 mg/dL (ref 0.3–1.2)
Total Protein: 5.5 g/dL — ABNORMAL LOW (ref 6.0–8.3)

## 2012-05-08 NOTE — Progress Notes (Signed)
[facility] Telephone:(336) [phone]   Fax:([phone]  OFFICE PROGRESS NOTE  [name],[name], MD, MD [address] [place] [address]  DIAGNOSIS: : Stage IIIA nonsmall cell lung cancer, adenocarcinoma, in a never smoker patient with positive EGFR mutation diagnosed in January 2010.   PRIOR THERAPY: Status post concurrent chemoradiation with weekly carboplatin and paclitaxel; last dose given March 21, 2009.   CURRENT THERAPY: Tarceva at 150 mg p.o. daily, status post 37 months of treatment.   INTERVAL HISTORY: RAFEEF [name] 77 y.o. female returns to the clinic today for followup visit. She continues to tolerate her Tarceva well with the exception of dry skin. Her biggest complaint today is that of "sciatica pain" affecting her right hip. This been present for the past 3 weeks. She has doubled her Relafen and also has taken an extra dose of ibuprofen here and there. She's been stretching as well as doing water aerobics and had a massage with some improvement however it is not completely resolved. She denied any significant shortness of breath, cough, hemoptysis, palpitations, weight loss or night sweats.   MEDICAL HISTORY: Past Medical History  Diagnosis Date  . Hemorrhoid   . COPD (chronic obstructive pulmonary disease)   . Shortness of breath   . GERD (gastroesophageal reflux disease)   . Arthritis   . Cancer     lung, currently being treated with Oral med    ALLERGIES:  is allergic to amoxicillin.  MEDICATIONS:  Current Outpatient Prescriptions  Medication Sig Dispense Refill  . betamethasone dipropionate (DIPROLENE) 0.05 % cream       . calcium carbonate (OS-CAL) 600 MG TABS Take 600 mg by mouth 2 (two) times daily with a meal.        . ciclopirox (PENLAC) 8 % solution       . conjugated estrogens (PREMARIN) vaginal cream Place vaginally once a week.        . Cyanocobalamin (B-12) 1000 MCG CAPS Take 1,000 mcg by mouth daily.       Marland Kitchen  erlotinib (TARCEVA) 150 MG tablet Take 1 tablet (150 mg total) by mouth daily. Take on an empty stomach 1 hour before meals or 2 hours after.  30 tablet  1  . HYDROcodone-acetaminophen (VICODIN) 5-500 MG per tablet       . loratadine (CLARITIN) 10 MG tablet Take 10 mg by mouth daily.        Marland Kitchen LORazepam (ATIVAN) 0.5 MG tablet Take 1 tablet (0.5 mg total) by mouth every 8 (eight) hours as needed.  30 tablet  0  . Multiple Vitamin (MULTIVITAMIN) tablet Take 1 tablet by mouth daily.        . nabumetone (RELAFEN) 500 MG tablet Take 250-1,000 mg by mouth daily as needed. For pain       . omeprazole (PRILOSEC) 10 MG capsule Take 20 mg by mouth daily.       Marland Kitchen tiotropium (SPIRIVA) 18 MCG inhalation capsule Place 18 mcg into inhaler and inhale daily.        Marland Kitchen VITAMIN D, ERGOCALCIFEROL, PO Take 2,000 Units by mouth daily.          SURGICAL HISTORY:  Past Surgical History  Procedure Date  . Breast surgery 02/1999    left nipple discharge/ Central duct excision  . Anal fissure repair 07/09/2011  . Knee arthroscopy     R  . Abdominal hysterectomy   . Thumb arthroscopy     L -  removed bone spur  . Tonsillectomy   . Appendectomy   . US echocardiography   . Thoracoscopy     w/lung biopsy  . Cardiovascular stress test     REVIEW OF SYSTEMS:  Pertinent items are noted in HPI.   PHYSICAL EXAMINATION: General appearance: alert, cooperative and no distress Neck: no adenopathy Lymph nodes: Cervical, supraclavicular, and axillary nodes normal. Resp: clear to auscultation bilaterally Cardio: regular rate and rhythm, S1, S2 normal, no murmur, click, rub or gallop GI: soft, non-tender; bowel sounds normal; no masses,  no organomegaly Extremities: extremities normal, atraumatic, no cyanosis or edema Neurologic: Alert and oriented X 3, normal strength and tone. Normal symmetric reflexes. Normal coordination and gait  ECOG PERFORMANCE STATUS: 0 - Asymptomatic  Blood pressure 125/81, pulse 93, temperature  97.1 F (36.2 C), temperature source Oral, height 5' 3.5" (1.613 m), weight 128 lb 9.6 oz (58.333 kg).  LABORATORY DATA: Lab Results  Component Value Date   WBC 5.1 05/08/2012   HGB 12.5 05/08/2012   HCT 37.7 05/08/2012   MCV 94.0 05/08/2012   PLT 239 05/08/2012      Chemistry      Component Value Date/Time   NA 142 04/10/2012 1016   NA 145 11/09/2011 1345   K 4.2 04/10/2012 1016   K 3.9 11/09/2011 1345   CL 108 04/10/2012 1016   CL 104 11/09/2011 1345   CO2 25 04/10/2012 1016   CO2 28 11/09/2011 1345   BUN 19 04/10/2012 1016   BUN 20 11/09/2011 1345   CREATININE 0.72 04/10/2012 1016   CREATININE 0.6 11/09/2011 1345      Component Value Date/Time   CALCIUM 8.5 04/10/2012 1016   CALCIUM 8.7 11/09/2011 1345   ALKPHOS 54 04/10/2012 1016   ALKPHOS 52 11/09/2011 1345   AST 21 04/10/2012 1016   AST 20 11/09/2011 1345   ALT 15 04/10/2012 1016   BILITOT 0.6 04/10/2012 1016   BILITOT 0.60 11/09/2011 1345       RADIOGRAPHIC STUDIES: Ct Chest W Contrast  03/06/2012  *RADIOLOGY REPORT*  Clinical Data: Lung cancer diagnosed in 2010.  Chemotherapy and radiation therapy.  Shortness of breath.  CT CHEST WITH CONTRAST  Technique:  Multidetector CT imaging of the chest was performed following the standard protocol during bolus administration of intravenous contrast.  Contrast: 80mL OMNIPAQUE IOHEXOL 300 MG/ML IJ SOLN  Comparison: Plain film of 11/21/2011.  Most recent CT of 11/09/2011.  Findings: Lung windows demonstrate possible nodular density at the right lung base medially.  This measures 4 mm on image 47 and is unchanged. Volume loss within the medial posterior super segment right lower lobe with bronchiectasis within.  Example image 32 and 33.  This is unchanged. Stable posterior right upper lobe 3 mm subpleural nodule on image 22. No new or enlarging nodules. Vague ground-glass nodule at the right apex at 3 mm on image 15 is unchanged. Left lower lobe nodule described previously is less apparent  today on image 43.  Soft tissue windows demonstrate no supraclavicular adenopathy. Normal heart size with trace pericardial fluid or thickening.  A moderate right-sided pleural effusion is similar.  No evidence of pleural enhancement or nodularity to suggest pleural disease/metastasis.  No central pulmonary embolism, on this non-dedicated study.  7 mm precarinal node is unchanged.  Small subcarinal/azygo-esophageal recess nodes are unchanged. Not pathologic by size criteria.  No hilar adenopathy.  Limited abdominal imaging demonstrates mild hepatic steatosis. Normal adrenal glands.  A retroaortic left renal vein. No  acute osseous abnormality.  Degenerative disc disease at the L1-L2 level is advanced.  IMPRESSION:  1. No acute process or evidence of metastatic disease in the chest. 2.  Radiation changes within the right lung.  Stable moderate right pleural effusion. 3.  Stable tiny bilateral pulmonary nodules.  Original Report Authenticated By: Consuello Bossier, M.D.    ASSESSMENT/PLAN: This is a very pleasant 77 years old white female with a stage IIIa non-small cell lung cancer currently on treatment with Tarceva 150 mg by mouth daily status post 37 months of treatment. Patient has no evidence for disease progression on the last scan. The patient was discussed with Dr. Arbutus Ped. She will continue on her Tarceva at 150 mg by mouth daily. She was advised to discontinue the use of both Relafen and ibuprofen. She may take her Relafen twice daily for the next 5 days as well as a device to try getting a massage. If her sciatica pain continues to persist she was advised to seek evaluation and management by an orthopedic physician. Patient voiced understanding of the instructions and recommendations regarding her sciatic pain she will followup with Dr. Arbutus Ped in one month with a repeat CBC differential C. met and CT of the chest with contrast to reevaluate her disease. Henriette Combs, Remy Voiles E, PA-C   All questions were  answered. The patient knows to call the clinic with any problems, questions or concerns. We can certainly see the patient much sooner if necessary.

## 2012-05-16 ENCOUNTER — Encounter: Payer: Self-pay | Admitting: *Deleted

## 2012-05-16 NOTE — Progress Notes (Signed)
Fax confirmation from [facility] that Tarceva was shipped on 5/23

## 2012-05-30 ENCOUNTER — Ambulatory Visit (HOSPITAL_COMMUNITY)
Admission: RE | Admit: 2012-05-30 | Discharge: 2012-05-30 | Disposition: A | Discharge status: Home / Self Care | Payer: Medicare Other | Source: Ambulatory Visit | Attending: Physician Assistant | Admitting: Physician Assistant

## 2012-05-30 DIAGNOSIS — J479 Bronchiectasis, uncomplicated: Secondary | ICD-10-CM | POA: Insufficient documentation

## 2012-05-30 DIAGNOSIS — Z79899 Other long term (current) drug therapy: Secondary | ICD-10-CM | POA: Insufficient documentation

## 2012-05-30 DIAGNOSIS — C3490 Malignant neoplasm of unspecified part of unspecified bronchus or lung: Secondary | ICD-10-CM

## 2012-05-30 DIAGNOSIS — J90 Pleural effusion, not elsewhere classified: Secondary | ICD-10-CM | POA: Insufficient documentation

## 2012-05-30 DIAGNOSIS — J984 Other disorders of lung: Secondary | ICD-10-CM | POA: Insufficient documentation

## 2012-05-30 DIAGNOSIS — R05 Cough: Secondary | ICD-10-CM | POA: Insufficient documentation

## 2012-05-30 DIAGNOSIS — R0602 Shortness of breath: Secondary | ICD-10-CM | POA: Insufficient documentation

## 2012-05-30 DIAGNOSIS — R059 Cough, unspecified: Secondary | ICD-10-CM | POA: Insufficient documentation

## 2012-05-30 MED ORDER — IOHEXOL 300 MG/ML  SOLN
80.0000 mL | Freq: Once | INTRAMUSCULAR | Status: AC | PRN
  Administered 2012-05-30: 80 mL via INTRAVENOUS

## 2012-06-03 ENCOUNTER — Other Ambulatory Visit (HOSPITAL_BASED_OUTPATIENT_CLINIC_OR_DEPARTMENT_OTHER): Payer: Medicare Other | Admitting: Lab

## 2012-06-03 ENCOUNTER — Telehealth: Payer: Self-pay | Admitting: Internal Medicine

## 2012-06-03 ENCOUNTER — Ambulatory Visit (HOSPITAL_BASED_OUTPATIENT_CLINIC_OR_DEPARTMENT_OTHER): Payer: Medicare Other | Admitting: Internal Medicine

## 2012-06-03 VITALS — BP 110/72 | HR 86 | Temp 97.2°F | Ht 63.5 in | Wt 130.3 lb

## 2012-06-03 DIAGNOSIS — C3490 Malignant neoplasm of unspecified part of unspecified bronchus or lung: Secondary | ICD-10-CM

## 2012-06-03 LAB — CBC WITH DIFFERENTIAL/PLATELET
BASO%: 0.4 % (ref 0.0–2.0)
Eosinophils Absolute: 0.3 10*3/uL (ref 0.0–0.5)
MCHC: 34.1 g/dL (ref 31.5–36.0)
MCV: 93.9 fL (ref 79.5–101.0)
MONO%: 8.7 % (ref 0.0–14.0)
NEUT#: 3.8 10*3/uL (ref 1.5–6.5)
RBC: 3.84 10*6/uL (ref 3.70–5.45)
RDW: 14.6 % — ABNORMAL HIGH (ref 11.2–14.5)
WBC: 5.1 10*3/uL (ref 3.9–10.3)

## 2012-06-03 LAB — COMPREHENSIVE METABOLIC PANEL
ALT: 19 U/L (ref 0–35)
AST: 21 U/L (ref 0–37)
Albumin: 3.7 g/dL (ref 3.5–5.2)
Alkaline Phosphatase: 54 U/L (ref 39–117)
Glucose, Bld: 70 mg/dL (ref 70–99)
Potassium: 3.9 mEq/L (ref 3.5–5.3)
Sodium: 142 mEq/L (ref 135–145)
Total Protein: 5.5 g/dL — ABNORMAL LOW (ref 6.0–8.3)

## 2012-06-03 NOTE — Telephone Encounter (Signed)
Gave pt appt for July 2013 lab and ML

## 2012-06-03 NOTE — Progress Notes (Signed)
[facility] Telephone:([phone]   Fax:([phone]  OFFICE PROGRESS NOTE  [name],[name], MD, MD [address], [address] [place] [address]  DIAGNOSIS: : Stage IIIA nonsmall cell lung cancer, adenocarcinoma, in a never smoker patient with positive EGFR mutation diagnosed in January 2010.   PRIOR THERAPY: Status post concurrent chemoradiation with weekly carboplatin and paclitaxel; last dose given March 21, 2009.   CURRENT THERAPY: Tarceva at 150 mg p.o. daily, status post 38 months of treatment.   INTERVAL HISTORY: Dana [name] 77 y.o. female returns to the clinic today for followup visit accompanied by her husband. The patient is doing fine with no specific complaints today. She has no significant chest pain or shortness breath, no cough or hemoptysis. No significant skin rash or diarrhea. She is tolerating her treatment with Tarceva well. The patient has repeat CT scan of the chest with contrast performed recently and she is here today for evaluation and discussion of her scan results.  MEDICAL HISTORY: Past Medical History  Diagnosis Date  . Hemorrhoid   . COPD (chronic obstructive pulmonary disease)   . Shortness of breath   . GERD (gastroesophageal reflux disease)   . Arthritis   . Cancer     lung, currently being treated with Oral med    ALLERGIES:  is allergic to doxycycline and amoxicillin.  MEDICATIONS:  Current Outpatient Prescriptions  Medication Sig Dispense Refill  . betamethasone dipropionate (DIPROLENE) 0.05 % cream       . calcium carbonate (OS-CAL) 600 MG TABS Take 600 mg by mouth 2 (two) times daily with a meal.        . ciclopirox (PENLAC) 8 % solution       . conjugated estrogens (PREMARIN) vaginal cream Place vaginally once a week.        . Cyanocobalamin (B-12) 1000 MCG CAPS Take 1,000 mcg by mouth daily.       Marland Kitchen erlotinib (TARCEVA) 150 MG tablet Take 1 tablet (150 mg total) by mouth daily. Take on an empty stomach 1  hour before meals or 2 hours after.  30 tablet  1  . loratadine (CLARITIN) 10 MG tablet Take 10 mg by mouth daily.        Marland Kitchen LORazepam (ATIVAN) 0.5 MG tablet Take 1 tablet (0.5 mg total) by mouth every 8 (eight) hours as needed.  30 tablet  0  . Multiple Vitamin (MULTIVITAMIN) tablet Take 1 tablet by mouth daily.        . nabumetone (RELAFEN) 500 MG tablet Take 250-1,000 mg by mouth daily as needed. For pain       . omeprazole (PRILOSEC) 10 MG capsule Take 20 mg by mouth daily.       Marland Kitchen tiotropium (SPIRIVA) 18 MCG inhalation capsule Place 18 mcg into inhaler and inhale daily.        Marland Kitchen VITAMIN D, ERGOCALCIFEROL, PO Take 2,000 Units by mouth daily.        Marland Kitchen HYDROcodone-acetaminophen (VICODIN) 5-500 MG per tablet         SURGICAL HISTORY:  Past Surgical History  Procedure Date  . Breast surgery 02/1999    left nipple discharge/ Central duct excision  . Anal fissure repair 07/09/2011  . Knee arthroscopy     R  . Abdominal hysterectomy   . Thumb arthroscopy     L - removed bone spur  . Tonsillectomy   . Appendectomy   . US echocardiography   . Thoracoscopy  w/lung biopsy  . Cardiovascular stress test     REVIEW OF SYSTEMS:  A comprehensive review of systems was negative.   PHYSICAL EXAMINATION: General appearance: alert, cooperative and no distress Head: Normocephalic, without obvious abnormality, atraumatic Neck: no adenopathy Lymph nodes: Cervical, supraclavicular, and axillary nodes normal. Resp: clear to auscultation bilaterally Cardio: regular rate and rhythm, S1, S2 normal, no murmur, click, rub or gallop GI: soft, non-tender; bowel sounds normal; no masses,  no organomegaly Extremities: extremities normal, atraumatic, no cyanosis or edema Neurologic: Alert and oriented X 3, normal strength and tone. Normal symmetric reflexes. Normal coordination and gait  ECOG PERFORMANCE STATUS: 1 - Symptomatic but completely ambulatory  Blood pressure 110/72, pulse 86, temperature 97.2  F (36.2 C), temperature source Oral, height 5' 3.5" (1.613 m), weight 130 lb 4.8 oz (59.104 kg).  LABORATORY DATA: Lab Results  Component Value Date   WBC 5.1 06/03/2012   HGB 12.3 06/03/2012   HCT 36.0 06/03/2012   MCV 93.9 06/03/2012   PLT 217 06/03/2012      Chemistry      Component Value Date/Time   NA 138 05/08/2012 1028   NA 145 11/09/2011 1345   K 4.5 05/08/2012 1028   K 3.9 11/09/2011 1345   CL 105 05/08/2012 1028   CL 104 11/09/2011 1345   CO2 28 05/08/2012 1028   CO2 28 11/09/2011 1345   BUN 23 05/08/2012 1028   BUN 20 11/09/2011 1345   CREATININE 0.67 05/08/2012 1028   CREATININE 0.6 11/09/2011 1345      Component Value Date/Time   CALCIUM 8.9 05/08/2012 1028   CALCIUM 8.7 11/09/2011 1345   ALKPHOS 54 05/08/2012 1028   ALKPHOS 52 11/09/2011 1345   AST 23 05/08/2012 1028   AST 20 11/09/2011 1345   ALT 17 05/08/2012 1028   BILITOT 0.6 05/08/2012 1028   BILITOT 0.60 11/09/2011 1345       RADIOGRAPHIC STUDIES: Ct Chest W Contrast  05/30/2012  *RADIOLOGY REPORT*  Clinical Data: Lung cancer, cough, shortness of breath.  Ongoing chemotherapy.  CT CHEST WITH CONTRAST  Technique:  Multidetector CT imaging of the chest was performed following the standard protocol during bolus administration of intravenous contrast.  Contrast: 80mL OMNIPAQUE IOHEXOL 300 MG/ML  SOLN  Comparison: 03/06/2012  Findings: Moderate to large right pleural effusion again noted, stable.  Stable medial right lung base nodule on image 46, approximately 4 mm.  Small subpleural nodules in the right upper lobe on image 22, stable.  Plate-like scarring in the right lower lobe with areas of bronchiectasis, stable.  No pulmonary nodules on the left.  No left pleural effusion.  No mediastinal, hilar, axillary or supraclavicular adenopathy present.  Heart is normal size.  Aorta is normal caliber. Visualized thyroid and chest wall soft tissues unremarkable. Imaging into the upper abdomen shows no acute findings.  No acute  bony abnormality.  Degenerative changes throughout the thoracic spine.  IMPRESSION: Stable small scattered nodular densities in the right lung.  Stable postradiation scarring in the right lower lobe.  Stable moderate to large right effusion.  Original Report Authenticated By: Cyndie Chime, M.D.    ASSESSMENT: This is a very pleasant 77 years old white female with stage IIIa non-small cell lung cancer status post concurrent chemoradiation and currently on Tarceva for the last 38 months. The patient is doing fine and tolerating her treatment fairly well. She has no evidence for disease progression on his recent scan.  PLAN: I recommended for her  to continue treatment was Tarceva with the current dose. She would come back for followup visit in one month with repeat CBC and comprehensive metabolic panel. The patient was advised to call me immediately if she has any concerning symptoms in the interval. I will continue to monitor the right pleural effusion closely and consider a right-sided thoracentesis if needed.  All questions were answered. The patient knows to call the clinic with any problems, questions or concerns. We can certainly see the patient much sooner if necessary.  I spent 15 minutes counseling the patient face to face. The total time spent in the appointment was 25 minutes.

## 2012-06-19 ENCOUNTER — Other Ambulatory Visit: Payer: Self-pay | Admitting: *Deleted

## 2012-06-19 ENCOUNTER — Other Ambulatory Visit: Payer: Self-pay | Admitting: Medical Oncology

## 2012-06-19 DIAGNOSIS — C3490 Malignant neoplasm of unspecified part of unspecified bronchus or lung: Secondary | ICD-10-CM

## 2012-06-19 MED ORDER — ERLOTINIB HCL 150 MG PO TABS: 150.0000 mg | ORAL_TABLET | Freq: Every day | ORAL | Status: DC

## 2012-06-19 NOTE — Telephone Encounter (Signed)
[facility] faxed confirmation of facsimile receipt for this prescription.

## 2012-06-19 NOTE — Telephone Encounter (Signed)
[facility] has not received rx so I called in verbal order.-pt notified

## 2012-06-20 NOTE — Telephone Encounter (Signed)
[facility] faxed confirmation of prescription shipment.  Tarceva was shipped 06-19-2012 with next business day delivery.

## 2012-07-03 ENCOUNTER — Other Ambulatory Visit (HOSPITAL_BASED_OUTPATIENT_CLINIC_OR_DEPARTMENT_OTHER): Payer: Medicare Other | Admitting: Lab

## 2012-07-03 ENCOUNTER — Encounter: Payer: Self-pay | Admitting: Physician Assistant

## 2012-07-03 ENCOUNTER — Telehealth: Payer: Self-pay | Admitting: Internal Medicine

## 2012-07-03 ENCOUNTER — Ambulatory Visit (HOSPITAL_BASED_OUTPATIENT_CLINIC_OR_DEPARTMENT_OTHER): Payer: Medicare Other | Admitting: Physician Assistant

## 2012-07-03 VITALS — BP 151/89 | HR 88 | Temp 97.5°F | Ht 63.5 in | Wt 129.5 lb

## 2012-07-03 DIAGNOSIS — C3490 Malignant neoplasm of unspecified part of unspecified bronchus or lung: Secondary | ICD-10-CM

## 2012-07-03 DIAGNOSIS — C3430 Malignant neoplasm of lower lobe, unspecified bronchus or lung: Secondary | ICD-10-CM

## 2012-07-03 LAB — COMPREHENSIVE METABOLIC PANEL
AST: 22 U/L (ref 0–37)
Albumin: 3.6 g/dL (ref 3.5–5.2)
Alkaline Phosphatase: 50 U/L (ref 39–117)
Potassium: 4 mEq/L (ref 3.5–5.3)
Sodium: 143 mEq/L (ref 135–145)
Total Bilirubin: 0.7 mg/dL (ref 0.3–1.2)
Total Protein: 5.8 g/dL — ABNORMAL LOW (ref 6.0–8.3)

## 2012-07-03 LAB — CBC WITH DIFFERENTIAL/PLATELET
BASO%: 0.3 % (ref 0.0–2.0)
EOS%: 4.3 % (ref 0.0–7.0)
LYMPH%: 11.8 % — ABNORMAL LOW (ref 14.0–49.7)
MCH: 32.3 pg (ref 25.1–34.0)
MCHC: 33.8 g/dL (ref 31.5–36.0)
MCV: 95.6 fL (ref 79.5–101.0)
MONO%: 8.4 % (ref 0.0–14.0)
NEUT#: 4.1 10*3/uL (ref 1.5–6.5)
Platelets: 222 10*3/uL (ref 145–400)
RBC: 3.98 10*6/uL (ref 3.70–5.45)
RDW: 14.5 % (ref 11.2–14.5)

## 2012-07-03 NOTE — Telephone Encounter (Signed)
gve the pt her aug 2013 appt calendar 

## 2012-07-07 NOTE — Progress Notes (Signed)
[facility] Telephone:(336) [phone]   Fax:(336) [phone]  OFFICE PROGRESS NOTE  [name],[name], MD [address] [place] [address]  DIAGNOSIS: : Stage IIIA nonsmall cell lung cancer, adenocarcinoma, in a never smoker patient with positive EGFR mutation diagnosed in January 2010.   PRIOR THERAPY: Status post concurrent chemoradiation with weekly carboplatin and paclitaxel; last dose given March 21, 2009.   CURRENT THERAPY: Tarceva at 150 mg p.o. daily, status post 39 months of treatment.   INTERVAL HISTORY: Dana [name] 77 y.o. female returns to the clinic today for followup visit. The patient is doing fine with no specific complaints today. She has noticed a slight increase in shortness of breath when she is walking up hills or stairs. She reports that she feels cold all the time but denies any frank fever or chills. She has no significant chest pain , no cough or hemoptysis. No significant skin rash or diarrhea, although her skin is somewhat dry.. She reports that she is been having leg cramps and started taking CoQ10. Her 73 year old granddaughter has been with she and her husband for the past several weeks and their usually routine has been disrupted. She is tolerating her treatment with Tarceva well.   MEDICAL HISTORY: Past Medical History  Diagnosis Date  . Hemorrhoid   . COPD (chronic obstructive pulmonary disease)   . Shortness of breath   . GERD (gastroesophageal reflux disease)   . Arthritis   . Cancer     lung, currently being treated with Oral med    ALLERGIES:  is allergic to doxycycline and amoxicillin.  MEDICATIONS:  Current Outpatient Prescriptions  Medication Sig Dispense Refill  . co-enzyme Q-10 30 MG capsule Take 30 mg by mouth daily.      . betamethasone dipropionate (DIPROLENE) 0.05 % cream       . calcium carbonate (OS-CAL) 600 MG TABS Take 600 mg by mouth 2 (two) times daily with a meal.        . ciclopirox (PENLAC) 8 %  solution       . conjugated estrogens (PREMARIN) vaginal cream Place vaginally once a week.        . Cyanocobalamin (B-12) 1000 MCG CAPS Take 1,000 mcg by mouth daily.       Marland Kitchen erlotinib (TARCEVA) 150 MG tablet Take 1 tablet (150 mg total) by mouth daily. Take on an empty stomach 1 hour before meals or 2 hours after.  30 tablet  1  . HYDROcodone-acetaminophen (VICODIN) 5-500 MG per tablet       . loratadine (CLARITIN) 10 MG tablet Take 10 mg by mouth daily.        Marland Kitchen LORazepam (ATIVAN) 0.5 MG tablet Take 1 tablet (0.5 mg total) by mouth every 8 (eight) hours as needed.  30 tablet  0  . LOTEMAX 0.5 % GEL       . Multiple Vitamin (MULTIVITAMIN) tablet Take 1 tablet by mouth daily.        . nabumetone (RELAFEN) 500 MG tablet Take 250-1,000 mg by mouth daily as needed. For pain       . omeprazole (PRILOSEC) 10 MG capsule Take 20 mg by mouth daily.       Marland Kitchen tiotropium (SPIRIVA) 18 MCG inhalation capsule Place 18 mcg into inhaler and inhale daily.        Marland Kitchen VITAMIN D, ERGOCALCIFEROL, PO Take 2,000 Units by mouth daily.          SURGICAL HISTORY:  Past Surgical History  Procedure Date  . Breast surgery 02/1999    left nipple discharge/ Central duct excision  . Anal fissure repair 07/09/2011  . Knee arthroscopy     R  . Abdominal hysterectomy   . Thumb arthroscopy     L - removed bone spur  . Tonsillectomy   . Appendectomy   . US echocardiography   . Thoracoscopy     w/lung biopsy  . Cardiovascular stress test     REVIEW OF SYSTEMS:  Pertinent items are noted in HPI.   PHYSICAL EXAMINATION: General appearance: alert, cooperative and no distress Head: Normocephalic, without obvious abnormality, atraumatic Neck: no adenopathy Lymph nodes: Cervical, supraclavicular, and axillary nodes normal. Resp: clear to auscultation bilaterally Cardio: regular rate and rhythm, S1, S2 normal, no murmur, click, rub or gallop GI: soft, non-tender; bowel sounds normal; no masses,  no  organomegaly Extremities: extremities normal, atraumatic, no cyanosis or edema Neurologic: Alert and oriented X 3, normal strength and tone. Normal symmetric reflexes. Normal coordination and gait  ECOG PERFORMANCE STATUS: 1 - Symptomatic but completely ambulatory  Blood pressure 151/89, pulse 88, temperature 97.5 F (36.4 C), temperature source Oral, height 5' 3.5" (1.613 m), weight 129 lb 8 oz (58.741 kg). Recheck of the blood pressure revealed 143/87  LABORATORY DATA: Lab Results  Component Value Date   WBC 5.4 07/03/2012   HGB 12.8 07/03/2012   HCT 38.0 07/03/2012   MCV 95.6 07/03/2012   PLT 222 07/03/2012      Chemistry      Component Value Date/Time   NA 143 07/03/2012 1215   NA 145 11/09/2011 1345   K 4.0 07/03/2012 1215   K 3.9 11/09/2011 1345   CL 107 07/03/2012 1215   CL 104 11/09/2011 1345   CO2 29 07/03/2012 1215   CO2 28 11/09/2011 1345   BUN 17 07/03/2012 1215   BUN 20 11/09/2011 1345   CREATININE 0.67 07/03/2012 1215   CREATININE 0.6 11/09/2011 1345      Component Value Date/Time   CALCIUM 9.0 07/03/2012 1215   CALCIUM 8.7 11/09/2011 1345   ALKPHOS 50 07/03/2012 1215   ALKPHOS 52 11/09/2011 1345   AST 22 07/03/2012 1215   AST 20 11/09/2011 1345   ALT 19 07/03/2012 1215   BILITOT 0.7 07/03/2012 1215   BILITOT 0.60 11/09/2011 1345       RADIOGRAPHIC STUDIES: Ct Chest W Contrast  05/30/2012  *RADIOLOGY REPORT*  Clinical Data: Lung cancer, cough, shortness of breath.  Ongoing chemotherapy.  CT CHEST WITH CONTRAST  Technique:  Multidetector CT imaging of the chest was performed following the standard protocol during bolus administration of intravenous contrast.  Contrast: 80mL OMNIPAQUE IOHEXOL 300 MG/ML  SOLN  Comparison: 03/06/2012  Findings: Moderate to large right pleural effusion again noted, stable.  Stable medial right lung base nodule on image 46, approximately 4 mm.  Small subpleural nodules in the right upper lobe on image 22, stable.  Plate-like scarring in the  right lower lobe with areas of bronchiectasis, stable.  No pulmonary nodules on the left.  No left pleural effusion.  No mediastinal, hilar, axillary or supraclavicular adenopathy present.  Heart is normal size.  Aorta is normal caliber. Visualized thyroid and chest wall soft tissues unremarkable. Imaging into the upper abdomen shows no acute findings.  No acute bony abnormality.  Degenerative changes throughout the thoracic spine.  IMPRESSION: Stable small scattered nodular densities in the right lung.  Stable postradiation scarring in the right lower  lobe.  Stable moderate to large right effusion.  Original Report Authenticated By: Cyndie Chime, M.D.    ASSESSMENT: This is a very pleasant 77 years old white female with stage IIIa non-small cell lung cancer status post concurrent chemoradiation and currently on Tarceva for the last 39 months. The patient is doing fine and tolerating her treatment fairly well. She has no evidence for disease progression on her recent scan. The patient was discussed with Dr. Arbutus Ped. She will continue on her Tarceva at 150 mg by mouth daily. She'll return in one month with a repeat CBC differential and C. Met. Her blood pressures today are bit more elevated than they have been on previous visits. She will check her blood pressure several times between now on her followup appointment with her primary care physician in mid August 2013.  Laural Benes, Kiaja Shorty E, PA-C   All questions were answered. The patient knows to call the clinic with any problems, questions or concerns. We can certainly see the patient much sooner if necessary.  I spent 20 minutes counseling the patient face to face. The total time spent in the appointment was 30 minutes.

## 2012-07-16 ENCOUNTER — Other Ambulatory Visit: Payer: Self-pay | Admitting: Medical Oncology

## 2012-07-16 ENCOUNTER — Telehealth: Payer: Self-pay | Admitting: Medical Oncology

## 2012-07-16 ENCOUNTER — Ambulatory Visit (HOSPITAL_COMMUNITY)
Admission: RE | Admit: 2012-07-16 | Discharge: 2012-07-16 | Disposition: A | Discharge status: Home / Self Care | Payer: Medicare Other | Source: Ambulatory Visit | Attending: Internal Medicine | Admitting: Internal Medicine

## 2012-07-16 ENCOUNTER — Telehealth: Payer: Self-pay | Admitting: Internal Medicine

## 2012-07-16 DIAGNOSIS — J90 Pleural effusion, not elsewhere classified: Secondary | ICD-10-CM | POA: Insufficient documentation

## 2012-07-16 DIAGNOSIS — C3490 Malignant neoplasm of unspecified part of unspecified bronchus or lung: Secondary | ICD-10-CM

## 2012-07-16 NOTE — Telephone Encounter (Signed)
S/w pt's husband re taking pt for cxr @ [facility] today. Per husband he will have her there by 4:30 pm. Order in [facility].

## 2012-07-16 NOTE — Telephone Encounter (Signed)
Reports increased SOB , requests to see if she has fluid on her lung. Per Dr Donnald Garre order CRX-order sent

## 2012-07-18 ENCOUNTER — Encounter (HOSPITAL_COMMUNITY): Payer: Self-pay | Admitting: *Deleted

## 2012-07-18 ENCOUNTER — Emergency Department (HOSPITAL_COMMUNITY): Payer: Medicare Other

## 2012-07-18 ENCOUNTER — Emergency Department (HOSPITAL_COMMUNITY)
Admission: EM | Admit: 2012-07-18 | Discharge: 2012-07-18 | Disposition: A | Discharge status: Home / Self Care | Payer: Medicare Other | Attending: Emergency Medicine | Admitting: Emergency Medicine

## 2012-07-18 ENCOUNTER — Encounter: Payer: Self-pay | Admitting: *Deleted

## 2012-07-18 ENCOUNTER — Telehealth: Payer: Self-pay | Admitting: Medical Oncology

## 2012-07-18 DIAGNOSIS — J4489 Other specified chronic obstructive pulmonary disease: Secondary | ICD-10-CM | POA: Insufficient documentation

## 2012-07-18 DIAGNOSIS — K219 Gastro-esophageal reflux disease without esophagitis: Secondary | ICD-10-CM | POA: Insufficient documentation

## 2012-07-18 DIAGNOSIS — R0602 Shortness of breath: Secondary | ICD-10-CM | POA: Insufficient documentation

## 2012-07-18 DIAGNOSIS — Z85118 Personal history of other malignant neoplasm of bronchus and lung: Secondary | ICD-10-CM | POA: Insufficient documentation

## 2012-07-18 DIAGNOSIS — Z87891 Personal history of nicotine dependence: Secondary | ICD-10-CM | POA: Insufficient documentation

## 2012-07-18 DIAGNOSIS — Z8739 Personal history of other diseases of the musculoskeletal system and connective tissue: Secondary | ICD-10-CM | POA: Insufficient documentation

## 2012-07-18 DIAGNOSIS — Z9089 Acquired absence of other organs: Secondary | ICD-10-CM | POA: Insufficient documentation

## 2012-07-18 DIAGNOSIS — C3490 Malignant neoplasm of unspecified part of unspecified bronchus or lung: Secondary | ICD-10-CM | POA: Insufficient documentation

## 2012-07-18 DIAGNOSIS — J90 Pleural effusion, not elsewhere classified: Secondary | ICD-10-CM

## 2012-07-18 DIAGNOSIS — J449 Chronic obstructive pulmonary disease, unspecified: Secondary | ICD-10-CM | POA: Insufficient documentation

## 2012-07-18 DIAGNOSIS — Z7982 Long term (current) use of aspirin: Secondary | ICD-10-CM | POA: Insufficient documentation

## 2012-07-18 DIAGNOSIS — Z79899 Other long term (current) drug therapy: Secondary | ICD-10-CM | POA: Insufficient documentation

## 2012-07-18 LAB — URINALYSIS, ROUTINE W REFLEX MICROSCOPIC
Bilirubin Urine: NEGATIVE
Glucose, UA: NEGATIVE mg/dL
Hgb urine dipstick: NEGATIVE
Ketones, ur: NEGATIVE mg/dL
Protein, ur: NEGATIVE mg/dL
pH: 5.5 (ref 5.0–8.0)

## 2012-07-18 LAB — BASIC METABOLIC PANEL
BUN: 15 mg/dL (ref 6–23)
Chloride: 109 mEq/L (ref 96–112)
Creatinine, Ser: 0.65 mg/dL (ref 0.50–1.10)
GFR calc Af Amer: >90 mL/min (ref >90)
GFR calc non Af Amer: 85 mL/min — ABNORMAL LOW (ref >90)
Glucose, Bld: 98 mg/dL (ref 70–99)

## 2012-07-18 LAB — CBC WITH DIFFERENTIAL/PLATELET
Basophils Absolute: 0 10*3/uL (ref 0.0–0.1)
Basophils Relative: 0 % (ref 0–1)
Eosinophils Absolute: 0.2 10*3/uL (ref 0.0–0.7)
HCT: 34.3 % — ABNORMAL LOW (ref 36.0–46.0)
Hemoglobin: 12 g/dL (ref 12.0–15.0)
MCH: 31.7 pg (ref 26.0–34.0)
MCHC: 35 g/dL (ref 30.0–36.0)
Monocytes Absolute: 0.6 10*3/uL (ref 0.1–1.0)
Monocytes Relative: 11 % (ref 3–12)
Neutro Abs: 3.7 10*3/uL (ref 1.7–7.7)
RDW: 14.3 % (ref 11.5–15.5)

## 2012-07-18 LAB — URINE MICROSCOPIC-ADD ON

## 2012-07-18 MED ORDER — IOHEXOL 350 MG/ML SOLN
100.0000 mL | Freq: Once | INTRAVENOUS | Status: AC | PRN
  Administered 2012-07-18: 85 mL via INTRAVENOUS

## 2012-07-18 MED ORDER — ASPIRIN 81 MG PO CHEW
162.0000 mg | CHEWABLE_TABLET | Freq: Once | ORAL | Status: AC
  Administered 2012-07-18: 162 mg via ORAL
  Filled 2012-07-18: qty 2

## 2012-07-18 NOTE — Progress Notes (Signed)
RECEIVED A FAX FROM [facility] CONCERNING A CONFIRMATION OF PRESCRIPTION SHIPMENT FOR TARCEVA ON 07/17/12.

## 2012-07-18 NOTE — ED Notes (Signed)
Pt reports shortness of breath since Wednesday. Hx of lung cancer. Pt speaking in complete sentences. Spoke to oncologist, had CXR and was sent home. States shortness of breath unimproved. PCP advised pt to come in for further evaluation. Shortness of breath worse with exertion.

## 2012-07-18 NOTE — ED Notes (Signed)
Patient transported to X-ray 

## 2012-07-18 NOTE — ED Notes (Signed)
Pt. Oxygen level was 100% while ambulating in the hall to use the restroom without oxygen.

## 2012-07-18 NOTE — ED Provider Notes (Addendum)
History     CSN: [id number]  Arrival date & time 07/18/12  1305   First MD Initiated Contact with Patient 07/18/12 1500      Chief Complaint  Patient presents with  . Shortness of Breath  . Lung Cancer    (Consider location/radiation/quality/duration/timing/severity/associated sxs/prior treatment) HPI Comments: Patient with hx of adenocarcinoma of the lung s/p chemo and radiation comes in with cc of SOB. Patient has been gradually having worsening other SOB over the past 2 month. She had a CT scan 2 months ago, that revealed some effusion, no PE, and was sent home. Overtime, her SOB has gotten progressively worse, and she cant even move without getting SOB now. There is no associated chest pain, no cough, n/v/f/c/blood loss. Patient denies any cardiac hx, and there is no hx of PE//DVT. Patient had a CXR that showed same amount of effusion as 2 months ago.     Patient is a 77 y.o. female presenting with shortness of breath. The history is provided by the patient.  Shortness of Breath  Associated symptoms include shortness of breath. Pertinent negatives include no chest pain, no fever, no cough and no wheezing.    Past Medical History  Diagnosis Date  . Hemorrhoid   . COPD (chronic obstructive pulmonary disease)   . Shortness of breath   . GERD (gastroesophageal reflux disease)   . Arthritis   . Cancer     lung, currently being treated with Oral med    Past Surgical History  Procedure Date  . Breast surgery 02/1999    left nipple discharge/ Central duct excision  . Anal fissure repair 07/09/2011  . Knee arthroscopy     R  . Abdominal hysterectomy   . Thumb arthroscopy     L - removed bone spur  . Tonsillectomy   . Appendectomy   . US echocardiography   . Thoracoscopy     w/lung biopsy  . Cardiovascular stress test     Family History  Problem Relation Age of Onset  . Cancer Father     bladder  . Cancer Son     kidney - removed as a teenager    History    Substance Use Topics  . Smoking status: Former Smoker -- 1.0 packs/day for 30 years  . Smokeless tobacco: Not on file  . Alcohol Use: No     occasional    OB History    Grav Para Term Preterm Abortions TAB SAB Ect Mult Living                  Review of Systems  Constitutional: Positive for activity change. Negative for fever.  HENT: Negative for facial swelling and neck pain.   Respiratory: Positive for shortness of breath. Negative for cough and wheezing.   Cardiovascular: Negative for chest pain and leg swelling.  Gastrointestinal: Negative for nausea, vomiting, abdominal pain, diarrhea, constipation, blood in stool and abdominal distention.  Genitourinary: Negative for hematuria and difficulty urinating.  Skin: Negative for color change.  Neurological: Negative for speech difficulty.  Hematological: Does not bruise/bleed easily.  Psychiatric/Behavioral: Negative for confusion.    Allergies  Doxycycline and Amoxicillin  Home Medications   Current Outpatient Rx  Name Route Sig Dispense Refill  . ASPIRIN EC 81 MG PO TBEC Oral Take 81 mg by mouth daily.    Marland Kitchen CALCIUM CARBONATE 600 MG PO TABS Oral Take 600 mg by mouth 2 (two) times daily with a meal.      .  VITAMIN D 1000 UNITS PO TABS Oral Take 2,000 Units by mouth 2 (two) times daily.    Marland Kitchen CICLOPIROX 8 % EX SOLN      . COENZYME Q10 30 MG PO CAPS Oral Take 30 mg by mouth daily.    Marland Kitchen ESTROGENS, CONJUGATED 0.625 MG/GM VA CREA Vaginal Place vaginally once a week.      . B-12 1000 MCG PO CAPS Oral Take 1,000 mcg by mouth 3 (three) times daily.     . ERLOTINIB 150 MG PO TABS Oral Take 1 tablet (150 mg total) by mouth daily. Take on an empty stomach 1 hour before meals or 2 hours after. 30 tablet 1  . LORATADINE 10 MG PO TABS Oral Take 10 mg by mouth daily.      Marland Kitchen LORAZEPAM 0.5 MG PO TABS Oral Take 1 tablet (0.5 mg total) by mouth every 8 (eight) hours as needed. 30 tablet 0  . ONE-DAILY MULTI VITAMINS PO TABS Oral Take 1 tablet  by mouth daily.      Marland Kitchen NABUMETONE 500 MG PO TABS Oral Take 250 mg by mouth daily as needed. For pain     . OMEPRAZOLE 10 MG PO CPDR Oral Take 20 mg by mouth daily.     Frazier Butt OP Both Eyes Place 1 drop into both eyes as needed. At night for dry eyes    . TIOTROPIUM BROMIDE MONOHYDRATE 18 MCG IN CAPS Inhalation Place 18 mcg into inhaler and inhale daily.        BP 141/90  Pulse 94  Temp 97.8 F (36.6 C) (Oral)  Resp 18  SpO2 100%  Physical Exam  Constitutional: She is oriented to person, place, and time. She appears well-developed.  HENT:  Head: Normocephalic and atraumatic.  Eyes: Conjunctivae and EOM are normal. Pupils are equal, round, and reactive to light.  Neck: Normal range of motion. Neck supple.  Cardiovascular: Normal rate, regular rhythm and normal heart sounds.   Pulmonary/Chest: Effort normal and breath sounds normal. No respiratory distress. She has no wheezes. She has no rales. She exhibits no tenderness.       Slightly reduced air movement on the right lower lung fields  Abdominal: Soft. Bowel sounds are normal. She exhibits no distension. There is no tenderness. There is no rebound and no guarding.  Neurological: She is alert and oriented to person, place, and time.  Skin: Skin is warm and dry.    ED Course  Procedures (including critical care time)   Labs Reviewed  BASIC METABOLIC PANEL  CBC WITH DIFFERENTIAL  PRO B NATRIURETIC PEPTIDE  TROPONIN I  URINALYSIS, ROUTINE W REFLEX MICROSCOPIC   Dg Chest 2 View  07/16/2012  *RADIOLOGY REPORT*  Clinical Data: Increase shortness of breath  CHEST - 2 VIEW  Comparison: 06/19/2012  Findings: Heart size appears normal.  There is a moderate-sized right pleural effusion and this is not significantly changed from 05/30/2012.  Coarsened interstitial markings are noted bilaterally suggestive of COPD.  IMPRESSION:  1.  Persistent, moderate-sized right pleural effusion.  Original Report Authenticated By: Rosealee Albee,  M.D.     No diagnosis found.    MDM  Differential diagnosis includes: ACS syndrome CHF exacerbation Valvular disorder Myocarditis Pericarditis Pericardial effusion Pneumonia Pleural effusion Pulmonary edema PE Anemia Musculoskeletal pain Worsening lung cancer  A/P Patient with hx of primary ling cancer, negative Ct with contrast 2 months back (+ pleural effusion) comes in with worsening sob. Primary concerns are cancer related etiologies  for her sob - worsening effusion, PE, pericardial effusion. Patient has no cardiac risk factors besides age. Given the medical hx, and pleural effusion, this sob as angina equivalent is very low on the ddx, and it probably would be more appropriate to resolve pleural issues, like the effusion, before looking at cardiac etiology (CHF, valvular disorder etc.) - especially since the exam doesn't seem consistent with CHF. Will get basic labs, and then get in touch with the outpatient providers to come up with the final plan.    Derwood Kaplan, MD 07/18/12 1607   Date: 07/18/2012  Rate: 95  Rhythm: normal sinus rhythm  QRS Axis: normal  Intervals: normal  ST/T Wave abnormalities: normal  Conduction Disutrbances:none  Narrative Interpretation:   Old EKG Reviewed: unchanged No low voltage (no evidence of large effusion, tamponade).     Derwood Kaplan, MD 07/18/12 1610  7:19 PM Discussed the case with oncology on cal. The recommendation was to still get a CT PE, which if negative, patient will be seen by Dr. Gwenyth Bouillon on Monday. CT is negative for PE, vitals are still stable, will discharge.   Derwood Kaplan, MD 07/18/12 1920

## 2012-07-18 NOTE — Telephone Encounter (Signed)
Pt called stating she is still SOB and it is worse. She had her cxr 2 days ago.I called pt back and spoke to her husband and advised him to take her to ED. He said he will take her when she wakes up . HE then said he will wake her up and take her.

## 2012-07-21 ENCOUNTER — Ambulatory Visit (HOSPITAL_COMMUNITY)
Admission: RE | Admit: 2012-07-21 | Discharge: 2012-07-21 | Disposition: A | Discharge status: Home / Self Care | Payer: Medicare Other | Source: Ambulatory Visit | Attending: Physician Assistant | Admitting: Physician Assistant

## 2012-07-21 ENCOUNTER — Other Ambulatory Visit: Payer: Self-pay | Admitting: *Deleted

## 2012-07-21 ENCOUNTER — Ambulatory Visit (HOSPITAL_COMMUNITY)
Admission: RE | Admit: 2012-07-21 | Discharge: 2012-07-21 | Disposition: A | Discharge status: Home / Self Care | Payer: Medicare Other | Source: Ambulatory Visit | Attending: Internal Medicine | Admitting: Internal Medicine

## 2012-07-21 ENCOUNTER — Telehealth: Payer: Self-pay | Admitting: Internal Medicine

## 2012-07-21 VITALS — BP 107/58

## 2012-07-21 DIAGNOSIS — J90 Pleural effusion, not elsewhere classified: Secondary | ICD-10-CM | POA: Insufficient documentation

## 2012-07-21 DIAGNOSIS — C3490 Malignant neoplasm of unspecified part of unspecified bronchus or lung: Secondary | ICD-10-CM

## 2012-07-21 NOTE — Progress Notes (Signed)
Pt called stating she went to the ED on Friday and they found she did have increased pleural effusion, she is more SOB, per Dr Donnald Garre, okay to send pt to IR for US guided thoracenteses, fluid does not need to be sent to cytology.  Pt aware that schedulers will be in touch, order marked as STAT.  SLJ

## 2012-07-21 NOTE — Procedures (Signed)
Procedure : right thoracentesis Specimen : 650 ml chylous fluid Complications : none immediate  Post CXR pending.  Fluid not requested to be sent - discarded

## 2012-07-21 NOTE — Telephone Encounter (Signed)
called pt with appt today

## 2012-07-31 ENCOUNTER — Other Ambulatory Visit (HOSPITAL_BASED_OUTPATIENT_CLINIC_OR_DEPARTMENT_OTHER): Payer: Medicare Other | Admitting: Lab

## 2012-07-31 ENCOUNTER — Ambulatory Visit (HOSPITAL_BASED_OUTPATIENT_CLINIC_OR_DEPARTMENT_OTHER): Payer: Medicare Other | Admitting: Physician Assistant

## 2012-07-31 ENCOUNTER — Encounter: Payer: Self-pay | Admitting: Physician Assistant

## 2012-07-31 ENCOUNTER — Telehealth: Payer: Self-pay | Admitting: *Deleted

## 2012-07-31 VITALS — BP 102/64 | HR 83 | Temp 97.7°F | Resp 20 | Ht 63.5 in | Wt 128.9 lb

## 2012-07-31 DIAGNOSIS — C3490 Malignant neoplasm of unspecified part of unspecified bronchus or lung: Secondary | ICD-10-CM

## 2012-07-31 DIAGNOSIS — C3430 Malignant neoplasm of lower lobe, unspecified bronchus or lung: Secondary | ICD-10-CM

## 2012-07-31 LAB — COMPREHENSIVE METABOLIC PANEL
ALT: 15 U/L (ref 0–35)
AST: 20 U/L (ref 0–37)
Calcium: 8.9 mg/dL (ref 8.4–10.5)
Chloride: 107 mEq/L (ref 96–112)
Creatinine, Ser: 0.79 mg/dL (ref 0.50–1.10)
Total Bilirubin: 0.7 mg/dL (ref 0.3–1.2)

## 2012-07-31 LAB — CBC WITH DIFFERENTIAL/PLATELET
BASO%: 0.3 % (ref 0.0–2.0)
EOS%: 2.5 % (ref 0.0–7.0)
HCT: 36.8 % (ref 34.8–46.6)
MCH: 32.2 pg (ref 25.1–34.0)
MCHC: 33.6 g/dL (ref 31.5–36.0)
NEUT%: 77.2 % — ABNORMAL HIGH (ref 38.4–76.8)
RBC: 3.84 10*6/uL (ref 3.70–5.45)
lymph#: 0.5 10*3/uL — ABNORMAL LOW (ref 0.9–3.3)

## 2012-07-31 NOTE — Telephone Encounter (Signed)
Gave patient appointment for 08-28-2012 starting at 10:15am

## 2012-08-03 NOTE — Progress Notes (Signed)
[facility] [phone]   [phone]  OFFICE PROGRESS NOTE  Dana [name],[name], MD [address] [place] [address]  DIAGNOSIS: : Stage IIIA nonsmall cell lung cancer, adenocarcinoma, in a never smoker patient with positive EGFR mutation diagnosed in Dana [name] 2010.   PRIOR THERAPY: Status post concurrent chemoradiation with weekly carboplatin and paclitaxel; last dose given March 21, 2009.   CURRENT THERAPY: Tarceva at 150 mg p.o. daily, status post 40 months of treatment.   INTERVAL HISTORY: Dana Dana [name] 77 y.o. female returns to the clinic today for followup visit. Recently she had difficulty with increased shortness of breath, ultimately requiring thoracentesis of 650 ml. She reports being frustrated by the process- 3 CXRs and a CT angiography of the chest before the thoracentesis was performed. Her breathing has improved since the procedure. She has had some increased episodes of diarrhea lately bit overall continues to tolerate her Tarceva therapy well. The patient is doing fine with no specific complaints today.  She has no significant chest pain , no cough or hemoptysis. No significant skin rash , although her skin is somewhat dry.  MEDICAL HISTORY: Past Medical History  Diagnosis Date  . Hemorrhoid   . COPD (chronic obstructive pulmonary disease)   . Shortness of breath   . GERD (gastroesophageal reflux disease)   . Arthritis   . Cancer     lung, currently being treated with Oral med    ALLERGIES:  is allergic to doxycycline and amoxicillin.  MEDICATIONS:  Current Outpatient Prescriptions  Medication Sig Dispense Refill  . aspirin EC 81 MG tablet Take 81 mg by mouth daily.      . calcium carbonate (OS-CAL) 600 MG TABS Take 600 mg by mouth 2 (two) times daily with a meal.        . cholecalciferol (VITAMIN D) 1000 UNITS tablet Take 2,000 Units by mouth 2 (two) times daily.      . ciclopirox (PENLAC) 8 % solution       .  co-enzyme Q-10 30 MG capsule Take 30 mg by mouth daily.      Marland Kitchen conjugated estrogens (PREMARIN) vaginal cream Place vaginally once a week.        . Cyanocobalamin (B-12) 1000 MCG CAPS Take 1,000 mcg by mouth 3 (three) times daily.       Marland Kitchen erlotinib (TARCEVA) 150 MG tablet Take 1 tablet (150 mg total) by mouth daily. Take on an empty stomach 1 hour before meals or 2 hours after.  30 tablet  1  . loratadine (CLARITIN) 10 MG tablet Take 10 mg by mouth daily.        Marland Kitchen LORazepam (ATIVAN) 0.5 MG tablet Take 1 tablet (0.5 mg total) by mouth every 8 (eight) hours as needed.  30 tablet  0  . LOTEMAX 0.5 % GEL       . Multiple Vitamin (MULTIVITAMIN) tablet Take 1 tablet by mouth daily.        . nabumetone (RELAFEN) 500 MG tablet Take 250 mg by mouth daily as needed. For pain       . omeprazole (PRILOSEC) 10 MG capsule Take 20 mg by mouth daily.       Bertram Gala Glycol-Propyl Glycol (SYSTANE OP) Place 1 drop into both eyes as needed. At night for dry eyes      . tiotropium (SPIRIVA) 18 MCG inhalation capsule Place 18 mcg into inhaler and inhale daily.  SURGICAL HISTORY:  Past Surgical History  Procedure Date  . Breast surgery 02/1999    left nipple discharge/ Central duct excision  . Anal fissure repair 07/09/2011  . Knee arthroscopy     R  . Abdominal hysterectomy   . Thumb arthroscopy     L - removed bone spur  . Tonsillectomy   . Appendectomy   . US echocardiography   . Thoracoscopy     w/lung biopsy  . Cardiovascular stress test     REVIEW OF SYSTEMS:  Pertinent items are noted in HPI.   PHYSICAL EXAMINATION: General appearance: alert, cooperative and no distress Head: Normocephalic, without obvious abnormality, atraumatic Neck: no adenopathy Lymph nodes: Cervical, supraclavicular, and axillary nodes normal. Resp: clear to auscultation bilaterally Cardio: regular rate and rhythm, S1, S2 normal, no murmur, click, rub or gallop GI: soft, non-tender; bowel sounds normal; no  masses,  no organomegaly Extremities: extremities normal, atraumatic, no cyanosis or edema Neurologic: Alert and oriented X 3, normal strength and tone. Normal symmetric reflexes. Normal coordination and gait  ECOG PERFORMANCE STATUS: 1 - Symptomatic but completely ambulatory  Blood pressure 102/64, pulse 83, temperature 97.7 F (36.5 C), temperature source Oral, resp. rate 20, height 5' 3.5" (1.613 m), weight 128 lb 14.4 oz (58.469 kg).  LABORATORY DATA: Lab Results  Component Value Date   WBC 4.9 07/31/2012   HGB 12.4 07/31/2012   HCT 36.8 07/31/2012   MCV 95.8 07/31/2012   PLT 249 07/31/2012      Chemistry      Component Value Date/Time   NA 141 07/31/2012 0936   NA 145 11/09/2011 1345   K 3.8 07/31/2012 0936   K 3.9 11/09/2011 1345   CL 107 07/31/2012 0936   CL 104 11/09/2011 1345   CO2 26 07/31/2012 0936   CO2 28 11/09/2011 1345   BUN 18 07/31/2012 0936   BUN 20 11/09/2011 1345   CREATININE 0.79 07/31/2012 0936   CREATININE 0.6 11/09/2011 1345      Component Value Date/Time   CALCIUM 8.9 07/31/2012 0936   CALCIUM 8.7 11/09/2011 1345   ALKPHOS 48 07/31/2012 0936   ALKPHOS 52 11/09/2011 1345   AST 20 07/31/2012 0936   AST 20 11/09/2011 1345   ALT 15 07/31/2012 0936   BILITOT 0.7 07/31/2012 0936   BILITOT 0.60 11/09/2011 1345       RADIOGRAPHIC STUDIES: Ct Chest W Contrast  05/30/2012  *RADIOLOGY REPORT*  Clinical Data: Lung cancer, cough, shortness of breath.  Ongoing chemotherapy.  CT CHEST WITH CONTRAST  Technique:  Multidetector CT imaging of the chest was performed following the standard protocol during bolus administration of intravenous contrast.  Contrast: 80mL OMNIPAQUE IOHEXOL 300 MG/ML  SOLN  Comparison: 03/06/2012  Findings: Moderate to large right pleural effusion again noted, stable.  Stable medial right lung base nodule on image 46, approximately 4 mm.  Small subpleural nodules in the right upper lobe on image 22, stable.  Plate-like scarring in the right lower lobe with areas of  bronchiectasis, stable.  No pulmonary nodules on the left.  No left pleural effusion.  No mediastinal, hilar, axillary or supraclavicular adenopathy present.  Heart is normal size.  Aorta is normal caliber. Visualized thyroid and chest wall soft tissues unremarkable. Imaging into the upper abdomen shows no acute findings.  No acute bony abnormality.  Degenerative changes throughout the thoracic spine.  IMPRESSION: Stable small scattered nodular densities in the right lung.  Stable postradiation scarring in the right lower lobe.  Stable moderate to large right effusion.  Original Report Authenticated By: Cyndie Chime, M.D.    ASSESSMENT: This is a very pleasant 77 years old white female with stage IIIa non-small cell lung cancer status post concurrent chemoradiation and currently on Tarceva for the last 39 months. The patient is doing fine and tolerating her treatment fairly well. She has no evidence for disease progression on her recent scan. The patient was discussed with Dr. Arline Asp in Dr. Asa Lente absence. She will continue on her Tarceva at 150 mg by mouth daily. She'll return in one month with a repeat CBC differential and C. Met. She is to call our office or seek medical evaluation should she have a return of her increased dyspnea as she may require repeat thoracentesis. As she recently had a CT angio, we will postpone her restaging CT scan another month.  Laural Benes, Moroni Nester E, PA-C   All questions were answered. The patient knows to call the clinic with any problems, questions or concerns. We can certainly see the patient much sooner if necessary.  I spent 20 minutes counseling the patient face to face. The total time spent in the appointment was 30 minutes.

## 2012-08-19 ENCOUNTER — Telehealth: Payer: Self-pay | Admitting: Medical Oncology

## 2012-08-19 ENCOUNTER — Other Ambulatory Visit: Payer: Self-pay | Admitting: Medical Oncology

## 2012-08-19 DIAGNOSIS — C3490 Malignant neoplasm of unspecified part of unspecified bronchus or lung: Secondary | ICD-10-CM

## 2012-08-19 MED ORDER — ERLOTINIB HCL 150 MG PO TABS: 150.0000 mg | ORAL_TABLET | Freq: Every day | ORAL | Status: DC

## 2012-08-19 NOTE — Telephone Encounter (Signed)
tarceva refilled 

## 2012-08-19 NOTE — Telephone Encounter (Signed)
Faxed refill for tarceva

## 2012-08-21 NOTE — Telephone Encounter (Signed)
RECEIVED A FAX FROM [facility] CONCERNING A CONFIRMATION OF PRESCRIPTION SHIPMENT FOR TARCEVA ON 08/20/12.

## 2012-08-26 ENCOUNTER — Other Ambulatory Visit: Payer: Self-pay | Admitting: Family Medicine

## 2012-08-26 DIAGNOSIS — M8580 Other specified disorders of bone density and structure, unspecified site: Secondary | ICD-10-CM

## 2012-08-28 ENCOUNTER — Encounter: Payer: Self-pay | Admitting: Physician Assistant

## 2012-08-28 ENCOUNTER — Ambulatory Visit (HOSPITAL_BASED_OUTPATIENT_CLINIC_OR_DEPARTMENT_OTHER): Payer: Medicare Other | Admitting: Physician Assistant

## 2012-08-28 ENCOUNTER — Other Ambulatory Visit (HOSPITAL_BASED_OUTPATIENT_CLINIC_OR_DEPARTMENT_OTHER): Payer: Medicare Other | Admitting: Lab

## 2012-08-28 ENCOUNTER — Telehealth: Payer: Self-pay | Admitting: Internal Medicine

## 2012-08-28 VITALS — BP 129/79 | HR 93 | Temp 97.9°F | Resp 18 | Ht 63.5 in | Wt 128.3 lb

## 2012-08-28 DIAGNOSIS — C3490 Malignant neoplasm of unspecified part of unspecified bronchus or lung: Secondary | ICD-10-CM

## 2012-08-28 DIAGNOSIS — C3430 Malignant neoplasm of lower lobe, unspecified bronchus or lung: Secondary | ICD-10-CM

## 2012-08-28 LAB — CBC WITH DIFFERENTIAL/PLATELET
BASO%: 0.3 % (ref 0.0–2.0)
EOS%: 5.9 % (ref 0.0–7.0)
HCT: 36.5 % (ref 34.8–46.6)
LYMPH%: 9.7 % — ABNORMAL LOW (ref 14.0–49.7)
MCH: 32.2 pg (ref 25.1–34.0)
MCHC: 33.7 g/dL (ref 31.5–36.0)
NEUT%: 75.6 % (ref 38.4–76.8)
Platelets: 230 10*3/uL (ref 145–400)

## 2012-08-28 LAB — COMPREHENSIVE METABOLIC PANEL (CC13)
ALT: 21 U/L (ref 0–55)
AST: 21 U/L (ref 5–34)
Creatinine: 0.7 mg/dL (ref 0.6–1.1)
Total Bilirubin: 0.8 mg/dL (ref 0.20–1.20)

## 2012-08-28 NOTE — Telephone Encounter (Signed)
APPTS MADE AND PRINTED FOR PT AOM °

## 2012-08-28 NOTE — Patient Instructions (Addendum)
Continue on Tarceva 150 mg by mouth daily Follow up with Dr. Arbutus Ped in one month with a repeat CT scan of your chest to re-evaluate your disease

## 2012-09-01 NOTE — Progress Notes (Signed)
[facility] [phone]   [phone]  OFFICE PROGRESS NOTE  [name],[name], MD [address] [place] [address]  DIAGNOSIS: : Stage IIIA nonsmall cell lung cancer, adenocarcinoma, in a never smoker patient with positive EGFR mutation diagnosed in January 2010.   PRIOR THERAPY: Status post concurrent chemoradiation with weekly carboplatin and paclitaxel; last dose given March 21, 2009.   CURRENT THERAPY: Tarceva at 150 mg p.o. daily, status post 41 months of treatment.   INTERVAL HISTORY: Dana [name] 77 y.o. female returns to the clinic today for followup visit. She states that her Tarceva was held for 2 days secondary to significantly increased diarrhea. She has resumed her Tarceva and her GI tract is back to her baseline. She does note some increase shortness of breath with stairs and with swimming. Overall she continues to tolerate her Tarceva therapy well.   She has no significant chest pain , no cough or hemoptysis. No significant skin rash , although her skin is somewhat dry.  MEDICAL HISTORY: Past Medical History  Diagnosis Date  . Hemorrhoid   . COPD (chronic obstructive pulmonary disease)   . Shortness of breath   . GERD (gastroesophageal reflux disease)   . Arthritis   . Cancer     lung, currently being treated with Oral med    ALLERGIES:  is allergic to doxycycline and amoxicillin.  MEDICATIONS:  Current Outpatient Prescriptions  Medication Sig Dispense Refill  . aspirin EC 81 MG tablet Take 81 mg by mouth daily.      . calcium carbonate (OS-CAL) 600 MG TABS Take 600 mg by mouth 2 (two) times daily with a meal.        . cholecalciferol (VITAMIN D) 1000 UNITS tablet Take 2,000 Units by mouth 2 (two) times daily.      . ciclopirox (PENLAC) 8 % solution       . co-enzyme Q-10 30 MG capsule Take 30 mg by mouth daily.      Marland Kitchen conjugated estrogens (PREMARIN) vaginal cream Place vaginally once a week.        .  Cyanocobalamin (B-12) 1000 MCG CAPS Take 1,000 mcg by mouth 3 (three) times daily.       . diphenoxylate-atropine (LOMOTIL) 2.5-0.025 MG per tablet       . erlotinib (TARCEVA) 150 MG tablet Take 1 tablet (150 mg total) by mouth daily. Take on an empty stomach 1 hour before meals or 2 hours after.  30 tablet  2  . loratadine (CLARITIN) 10 MG tablet Take 10 mg by mouth daily.        Marland Kitchen LORazepam (ATIVAN) 0.5 MG tablet Take 1 tablet (0.5 mg total) by mouth every 8 (eight) hours as needed.  30 tablet  0  . LOTEMAX 0.5 % GEL       . Multiple Vitamin (MULTIVITAMIN) tablet Take 1 tablet by mouth daily.        . nabumetone (RELAFEN) 500 MG tablet Take 250 mg by mouth daily as needed. For pain       . omeprazole (PRILOSEC) 10 MG capsule Take 20 mg by mouth daily.       Bertram Gala Glycol-Propyl Glycol (SYSTANE OP) Place 1 drop into both eyes as needed. At night for dry eyes      . tiotropium (SPIRIVA) 18 MCG inhalation capsule Place 18 mcg into inhaler and inhale daily.          SURGICAL HISTORY:  Past  Surgical History  Procedure Date  . Breast surgery 02/1999    left nipple discharge/ Central duct excision  . Anal fissure repair 07/09/2011  . Knee arthroscopy     R  . Abdominal hysterectomy   . Thumb arthroscopy     L - removed bone spur  . Tonsillectomy   . Appendectomy   . US echocardiography   . Thoracoscopy     w/lung biopsy  . Cardiovascular stress test     REVIEW OF SYSTEMS:  Pertinent items are noted in HPI.   PHYSICAL EXAMINATION: General appearance: alert, cooperative and no distress Head: Normocephalic, without obvious abnormality, atraumatic Neck: no adenopathy Lymph nodes: Cervical, supraclavicular, and axillary nodes normal. Resp: clear to auscultation bilaterally Cardio: regular rate and rhythm, S1, S2 normal, no murmur, click, rub or gallop GI: soft, non-tender; bowel sounds normal; no masses,  no organomegaly Extremities: extremities normal, atraumatic, no cyanosis or  edema Neurologic: Alert and oriented X 3, normal strength and tone. Normal symmetric reflexes. Normal coordination and gait  ECOG PERFORMANCE STATUS: 1 - Symptomatic but completely ambulatory  Blood pressure 129/79, pulse 93, temperature 97.9 F (36.6 C), temperature source Oral, resp. rate 18, height 5' 3.5" (1.613 m), weight 128 lb 4.8 oz (58.196 kg).  LABORATORY DATA: Lab Results  Component Value Date   WBC 6.3 08/28/2012   HGB 12.3 08/28/2012   HCT 36.5 08/28/2012   MCV 95.7 08/28/2012   PLT 230 08/28/2012      Chemistry      Component Value Date/Time   NA 142 08/28/2012 1018   NA 141 07/31/2012 0936   NA 145 11/09/2011 1345   K 4.0 08/28/2012 1018   K 3.8 07/31/2012 0936   K 3.9 11/09/2011 1345   CL 108* 08/28/2012 1018   CL 107 07/31/2012 0936   CL 104 11/09/2011 1345   CO2 24 08/28/2012 1018   CO2 26 07/31/2012 0936   CO2 28 11/09/2011 1345   BUN 23.0 08/28/2012 1018   BUN 18 07/31/2012 0936   BUN 20 11/09/2011 1345   CREATININE 0.7 08/28/2012 1018   CREATININE 0.79 07/31/2012 0936   CREATININE 0.6 11/09/2011 1345      Component Value Date/Time   CALCIUM 8.9 07/31/2012 0936   CALCIUM 8.7 11/09/2011 1345   ALKPHOS 55 08/28/2012 1018   ALKPHOS 48 07/31/2012 0936   ALKPHOS 52 11/09/2011 1345   AST 21 08/28/2012 1018   AST 20 07/31/2012 0936   AST 20 11/09/2011 1345   ALT 21 08/28/2012 1018   ALT 15 07/31/2012 0936   BILITOT 0.80 08/28/2012 1018   BILITOT 0.7 07/31/2012 0936   BILITOT 0.60 11/09/2011 1345       RADIOGRAPHIC STUDIES: Ct Chest W Contrast  05/30/2012  *RADIOLOGY REPORT*  Clinical Data: Lung cancer, cough, shortness of breath.  Ongoing chemotherapy.  CT CHEST WITH CONTRAST  Technique:  Multidetector CT imaging of the chest was performed following the standard protocol during bolus administration of intravenous contrast.  Contrast: 80mL OMNIPAQUE IOHEXOL 300 MG/ML  SOLN  Comparison: 03/06/2012  Findings: Moderate to large right pleural effusion again noted, stable.  Stable medial right lung  base nodule on image 46, approximately 4 mm.  Small subpleural nodules in the right upper lobe on image 22, stable.  Plate-like scarring in the right lower lobe with areas of bronchiectasis, stable.  No pulmonary nodules on the left.  No left pleural effusion.  No mediastinal, hilar, axillary or supraclavicular adenopathy present.  Heart is  normal size.  Aorta is normal caliber. Visualized thyroid and chest wall soft tissues unremarkable. Imaging into the upper abdomen shows no acute findings.  No acute bony abnormality.  Degenerative changes throughout the thoracic spine.  IMPRESSION: Stable small scattered nodular densities in the right lung.  Stable postradiation scarring in the right lower lobe.  Stable moderate to large right effusion.  Original Report Authenticated By: Cyndie Chime, M.D.    ASSESSMENT: This is a very pleasant 77 years old white female with stage IIIa non-small cell lung cancer status post concurrent chemoradiation and currently on Tarceva for the last 41 months. The patient is doing fine and tolerating her treatment fairly well. She has no evidence for disease progression on her recent scan. The patient was discussed with Dr. Arbutus Ped. She will continue on her Tarceva at 150 mg by mouth daily. She'll followup with Dr. Arbutus Ped in one month with a repeat CBC differential, C. met and CT the chest with contrast to reevaluate her disease.  Laural Benes, Nastasha Reising E, PA-C   All questions were answered. The patient knows to call the clinic with any problems, questions or concerns. We can certainly see the patient much sooner if necessary.  I spent 20 minutes counseling the patient face to face. The total time spent in the appointment was 30 minutes.

## 2012-09-18 ENCOUNTER — Encounter: Payer: Self-pay | Admitting: *Deleted

## 2012-09-18 NOTE — Progress Notes (Signed)
RECEIVED A FAX FROM [facility] CONCERNING A CONFIRMATION OF PRESCRIPTION SHIPMENT FOR TARCEVA ON 09/17/12.

## 2012-09-26 ENCOUNTER — Other Ambulatory Visit (HOSPITAL_BASED_OUTPATIENT_CLINIC_OR_DEPARTMENT_OTHER): Payer: Medicare Other | Admitting: Lab

## 2012-09-26 ENCOUNTER — Ambulatory Visit (HOSPITAL_COMMUNITY)
Admission: RE | Admit: 2012-09-26 | Discharge: 2012-09-26 | Disposition: A | Discharge status: Home / Self Care | Payer: Medicare Other | Source: Ambulatory Visit | Attending: Physician Assistant | Admitting: Physician Assistant

## 2012-09-26 DIAGNOSIS — C3490 Malignant neoplasm of unspecified part of unspecified bronchus or lung: Secondary | ICD-10-CM

## 2012-09-26 DIAGNOSIS — J90 Pleural effusion, not elsewhere classified: Secondary | ICD-10-CM | POA: Insufficient documentation

## 2012-09-26 LAB — COMPREHENSIVE METABOLIC PANEL (CC13)
ALT: 24 U/L (ref 0–55)
BUN: 23 mg/dL (ref 7.0–26.0)
CO2: 22 mEq/L (ref 22–29)
Creatinine: 0.8 mg/dL (ref 0.6–1.1)
Total Bilirubin: 0.9 mg/dL (ref 0.20–1.20)

## 2012-09-26 LAB — CBC WITH DIFFERENTIAL/PLATELET
BASO%: 0.3 % (ref 0.0–2.0)
HCT: 37.7 % (ref 34.8–46.6)
LYMPH%: 9.6 % — ABNORMAL LOW (ref 14.0–49.7)
MCH: 32.8 pg (ref 25.1–34.0)
MCHC: 33.9 g/dL (ref 31.5–36.0)
MONO#: 0.6 10*3/uL (ref 0.1–0.9)
NEUT%: 77.6 % — ABNORMAL HIGH (ref 38.4–76.8)
Platelets: 235 10*3/uL (ref 145–400)
WBC: 6.5 10*3/uL (ref 3.9–10.3)

## 2012-09-26 MED ORDER — IOHEXOL 300 MG/ML  SOLN
80.0000 mL | Freq: Once | INTRAMUSCULAR | Status: AC | PRN
  Administered 2012-09-26: 80 mL via INTRAVENOUS

## 2012-09-29 ENCOUNTER — Ambulatory Visit (HOSPITAL_BASED_OUTPATIENT_CLINIC_OR_DEPARTMENT_OTHER): Payer: Medicare Other | Admitting: Internal Medicine

## 2012-09-29 ENCOUNTER — Telehealth: Payer: Self-pay | Admitting: Internal Medicine

## 2012-09-29 VITALS — BP 107/69 | HR 87 | Temp 96.8°F | Resp 20 | Ht 63.5 in | Wt 129.3 lb

## 2012-09-29 DIAGNOSIS — C3430 Malignant neoplasm of lower lobe, unspecified bronchus or lung: Secondary | ICD-10-CM

## 2012-09-29 DIAGNOSIS — C3490 Malignant neoplasm of unspecified part of unspecified bronchus or lung: Secondary | ICD-10-CM | POA: Insufficient documentation

## 2012-09-29 NOTE — Progress Notes (Signed)
[facility] Telephone:([phone]) [phone]   Fax:(336) [phone]  OFFICE PROGRESS NOTE  [name],[name], MD [address] [place] [address]  DIAGNOSIS: : Stage IIIA nonsmall cell lung cancer, adenocarcinoma, in a never smoker patient with positive EGFR mutation diagnosed in January 2010.   PRIOR THERAPY: Status post concurrent chemoradiation with weekly carboplatin and paclitaxel; last dose given March 21, 2009.   CURRENT THERAPY: Tarceva at 150 mg p.o. daily, status post 42 months of treatment.  INTERVAL HISTORY: Dana [name] 77 y.o. female returns to the clinic today for followup visit accompanied by her husband. The patient is feeling fine today with no specific complaints. She denied having any significant chest pain, shortness breath, cough or hemoptysis. She has no significant weight loss or night sweats. She has repeat CT scan of the chest performed recently and she is here today for evaluation and discussion of her scan results. The patient is tolerating her treatment with Tarceva fairly well with no significant adverse effect.  MEDICAL HISTORY: Past Medical History  Diagnosis Date  . Hemorrhoid   . COPD (chronic obstructive pulmonary disease)   . Shortness of breath   . GERD (gastroesophageal reflux disease)   . Arthritis   . Cancer     lung, currently being treated with Oral med    ALLERGIES:  is allergic to doxycycline and amoxicillin.  MEDICATIONS:  Current Outpatient Prescriptions  Medication Sig Dispense Refill  . aspirin EC 81 MG tablet Take 81 mg by mouth daily.      . calcium carbonate (OS-CAL) 600 MG TABS Take 600 mg by mouth 2 (two) times daily with a meal.        . cholecalciferol (VITAMIN D) 1000 UNITS tablet Take 2,000 Units by mouth 2 (two) times daily.      . ciclopirox (PENLAC) 8 % solution       . co-enzyme Q-10 30 MG capsule Take 30 mg by mouth daily.      Marland Kitchen conjugated estrogens (PREMARIN) vaginal cream Place vaginally once a  week.        . Cyanocobalamin (B-12) 1000 MCG CAPS Take 1,000 mcg by mouth 3 (three) times daily.       . diphenoxylate-atropine (LOMOTIL) 2.5-0.025 MG per tablet       . erlotinib (TARCEVA) 150 MG tablet Take 1 tablet (150 mg total) by mouth daily. Take on an empty stomach 1 hour before meals or 2 hours after.  30 tablet  2  . loratadine (CLARITIN) 10 MG tablet Take 10 mg by mouth daily.        Marland Kitchen LORazepam (ATIVAN) 0.5 MG tablet Take 1 tablet (0.5 mg total) by mouth every 8 (eight) hours as needed.  30 tablet  0  . LOTEMAX 0.5 % GEL       . Multiple Vitamin (MULTIVITAMIN) tablet Take 1 tablet by mouth daily.        . nabumetone (RELAFEN) 500 MG tablet Take 250 mg by mouth daily as needed. For pain       . omeprazole (PRILOSEC) 10 MG capsule Take 20 mg by mouth daily.       Bertram Gala Glycol-Propyl Glycol (SYSTANE OP) Place 1 drop into both eyes as needed. At night for dry eyes      . tiotropium (SPIRIVA) 18 MCG inhalation capsule Place 18 mcg into inhaler and inhale daily.          SURGICAL HISTORY:  Past Surgical History  Procedure Date  . Breast surgery 02/1999    left nipple discharge/ Central duct excision  . Anal fissure repair 07/09/2011  . Knee arthroscopy     R  . Abdominal hysterectomy   . Thumb arthroscopy     L - removed bone spur  . Tonsillectomy   . Appendectomy   . US echocardiography   . Thoracoscopy     w/lung biopsy  . Cardiovascular stress test     REVIEW OF SYSTEMS:  A comprehensive review of systems was negative.   PHYSICAL EXAMINATION: General appearance: alert, cooperative and no distress Neck: no adenopathy Lymph nodes: Cervical, supraclavicular, and axillary nodes normal. Resp: clear to auscultation bilaterally Cardio: regular rate and rhythm, S1, S2 normal, no murmur, click, rub or gallop GI: soft, non-tender; bowel sounds normal; no masses,  no organomegaly Extremities: extremities normal, atraumatic, no cyanosis or edema  ECOG PERFORMANCE STATUS:  0 - Asymptomatic  Blood pressure 107/69, pulse 87, temperature 96.8 F (36 C), temperature source Oral, resp. rate 20, height 5' 3.5" (1.613 m), weight 129 lb 4.8 oz (58.65 kg).  LABORATORY DATA: Lab Results  Component Value Date   WBC 6.5 09/26/2012   HGB 12.8 09/26/2012   HCT 37.7 09/26/2012   MCV 96.9 09/26/2012   PLT 235 09/26/2012      Chemistry      Component Value Date/Time   NA 139 09/26/2012 1015   NA 141 07/31/2012 0936   NA 145 11/09/2011 1345   K 4.2 09/26/2012 1015   K 3.8 07/31/2012 0936   K 3.9 11/09/2011 1345   CL 108* 09/26/2012 1015   CL 107 07/31/2012 0936   CL 104 11/09/2011 1345   CO2 22 09/26/2012 1015   CO2 26 07/31/2012 0936   CO2 28 11/09/2011 1345   BUN 23.0 09/26/2012 1015   BUN 18 07/31/2012 0936   BUN 20 11/09/2011 1345   CREATININE 0.8 09/26/2012 1015   CREATININE 0.79 07/31/2012 0936   CREATININE 0.6 11/09/2011 1345      Component Value Date/Time   CALCIUM 9.1 09/26/2012 1015   CALCIUM 8.9 07/31/2012 0936   CALCIUM 8.7 11/09/2011 1345   ALKPHOS 61 09/26/2012 1015   ALKPHOS 48 07/31/2012 0936   ALKPHOS 52 11/09/2011 1345   AST 24 09/26/2012 1015   AST 20 07/31/2012 0936   AST 20 11/09/2011 1345   ALT 24 09/26/2012 1015   ALT 15 07/31/2012 0936   BILITOT 0.90 09/26/2012 1015   BILITOT 0.7 07/31/2012 0936   BILITOT 0.60 11/09/2011 1345       RADIOGRAPHIC STUDIES: Ct Chest W Contrast  09/26/2012  *RADIOLOGY REPORT*  Clinical Data: Lung cancer, chemotherapy and XRT complete  CT CHEST WITH CONTRAST  Technique:  Multidetector CT imaging of the chest was performed following the standard protocol during bolus administration of intravenous contrast.  Contrast: 80mL OMNIPAQUE IOHEXOL 300 MG/ML  SOLN  Comparison: 07/18/2012  Findings: Moderate right pleural effusion, mildly decreased.  Right paramediastinal radiation changes. Stable 3 mm nodule in the medial right lower lobe (series 5/image 44).  Additional minimal nodularity in the right upper lobe, unchanged.  No  new/suspicious pulmonary nodules.  No pneumothorax.  Visualized thyroid is unremarkable.  The heart is normal in size.  Trace anterior pericardial fluid.  Small mediastinal lymph nodes which do not meet pathologic CT size criteria, unchanged.  No suspicious hilar or axillary lymphadenopathy.  Visualized upper abdomen is unremarkable.  Degenerative changes of the visualized thoracolumbar spine.  IMPRESSION: Right  paramediastinal radiation changes.  No evidence of recurrent or metastatic disease in the chest.  Moderate right pleural effusion, decreased.   Original Report Authenticated By: Charline Bills, M.D.     ASSESSMENT: This is a very pleasant 77 years old white female with history of stage IIIa non-small cell lung cancer, adenocarcinoma with positive EGFR mutation status post concurrent chemoradiation and currently on Tarceva 150 mg by mouth daily for the last 42 months. The patient is tolerating her treatment fairly well and she has no evidence for disease progression.  PLAN: I discussed the scan results with the patient and her husband. I recommended for her to continue on Tarceva 150 mg by mouth daily. She would come back for followup visit in 6 weeks for reevaluation and management any adverse effect of her treatment. She was advised to call me immediately if she has any concerning symptoms in the interval..  All questions were answered. The patient knows to call the clinic with any problems, questions or concerns. We can certainly see the patient much sooner if necessary.  I spent 15 minutes counseling the patient face to face. The total time spent in the appointment was 25 minutes.

## 2012-09-29 NOTE — Patient Instructions (Signed)
Your CT scan showed no evidence for disease progression. Followup in 6 weeks 

## 2012-09-29 NOTE — Telephone Encounter (Signed)
Gave pt appt for 11/10/12 lab and ML

## 2012-09-30 ENCOUNTER — Ambulatory Visit
Admission: RE | Admit: 2012-09-30 | Discharge: 2012-09-30 | Disposition: A | Discharge status: Home / Self Care | Payer: Medicare Other | Source: Ambulatory Visit | Attending: Family Medicine | Admitting: Family Medicine

## 2012-09-30 DIAGNOSIS — M8580 Other specified disorders of bone density and structure, unspecified site: Secondary | ICD-10-CM

## 2012-10-22 ENCOUNTER — Telehealth: Payer: Self-pay | Admitting: *Deleted

## 2012-10-22 NOTE — Telephone Encounter (Signed)
[facility] faxed confirmation of prescription shipment.  Tarceva was shipped 10-21-2012 with next business day delivery.

## 2012-11-10 ENCOUNTER — Ambulatory Visit (HOSPITAL_BASED_OUTPATIENT_CLINIC_OR_DEPARTMENT_OTHER): Payer: Medicare Other | Admitting: Physician Assistant

## 2012-11-10 ENCOUNTER — Other Ambulatory Visit (HOSPITAL_BASED_OUTPATIENT_CLINIC_OR_DEPARTMENT_OTHER): Payer: Medicare Other | Admitting: Lab

## 2012-11-10 ENCOUNTER — Telehealth: Payer: Self-pay | Admitting: Internal Medicine

## 2012-11-10 VITALS — BP 107/65 | HR 94 | Temp 97.1°F | Resp 18 | Ht 63.5 in | Wt 132.7 lb

## 2012-11-10 DIAGNOSIS — C3490 Malignant neoplasm of unspecified part of unspecified bronchus or lung: Secondary | ICD-10-CM

## 2012-11-10 DIAGNOSIS — C3430 Malignant neoplasm of lower lobe, unspecified bronchus or lung: Secondary | ICD-10-CM

## 2012-11-10 LAB — CBC WITH DIFFERENTIAL/PLATELET
BASO%: 0.1 % (ref 0.0–2.0)
Basophils Absolute: 0 10*3/uL (ref 0.0–0.1)
EOS%: 2.9 % (ref 0.0–7.0)
Eosinophils Absolute: 0.2 10*3/uL (ref 0.0–0.5)
HCT: 38.8 % (ref 34.8–46.6)
HGB: 12.9 g/dL (ref 11.6–15.9)
LYMPH%: 9.5 % — ABNORMAL LOW (ref 14.0–49.7)
MCH: 31.5 pg (ref 25.1–34.0)
MCHC: 33.2 g/dL (ref 31.5–36.0)
MCV: 94.6 fL (ref 79.5–101.0)
MONO#: 0.4 10*3/uL (ref 0.1–0.9)
MONO%: 6.3 % (ref 0.0–14.0)
NEUT#: 5.6 10*3/uL (ref 1.5–6.5)
NEUT%: 81.2 % — ABNORMAL HIGH (ref 38.4–76.8)
Platelets: 263 10*3/uL (ref 145–400)
RBC: 4.1 10*6/uL (ref 3.70–5.45)
RDW: 13.6 % (ref 11.2–14.5)
WBC: 6.9 10*3/uL (ref 3.9–10.3)
lymph#: 0.7 10*3/uL — ABNORMAL LOW (ref 0.9–3.3)

## 2012-11-10 LAB — COMPREHENSIVE METABOLIC PANEL (CC13)
BUN: 26 mg/dL (ref 7.0–26.0)
CO2: 28 mEq/L (ref 22–29)
Calcium: 9.5 mg/dL (ref 8.4–10.4)
Chloride: 108 mEq/L — ABNORMAL HIGH (ref 98–107)
Creatinine: 0.9 mg/dL (ref 0.6–1.1)
Glucose: 84 mg/dl (ref 70–99)
Total Bilirubin: 0.75 mg/dL (ref 0.20–1.20)

## 2012-11-10 LAB — TSH: TSH: 2.661 u[IU]/mL (ref 0.350–4.500)

## 2012-11-10 NOTE — Patient Instructions (Addendum)
Continue Tarceva 150 mg by mouth daily Follow up in 6 weeks

## 2012-11-10 NOTE — Telephone Encounter (Signed)
Gave  Pt appt for lab and ML for December 2013

## 2012-11-13 ENCOUNTER — Other Ambulatory Visit: Payer: Self-pay | Admitting: *Deleted

## 2012-11-13 DIAGNOSIS — C3490 Malignant neoplasm of unspecified part of unspecified bronchus or lung: Secondary | ICD-10-CM

## 2012-11-13 NOTE — Telephone Encounter (Signed)
THIS REFILL REQUEST FOR TARCEVA WAS GIVEN TO DR.MOHAMED'S NURSE, STEPHANIE JOHNSON,RN. 

## 2012-11-14 MED ORDER — ERLOTINIB HCL 150 MG PO TABS: 150.0000 mg | ORAL_TABLET | Freq: Every day | ORAL | Status: DC

## 2012-11-14 NOTE — Telephone Encounter (Signed)
RECEIVED A FAX FROM [facility] CONCERNING A CONFIRMATION OF FACSIMILE RECEIPT FOR PT.'S REFERRAL. 

## 2012-11-14 NOTE — Addendum Note (Signed)
Addended by: Arvilla Meres on: 11/14/2012 01:26 PM   Modules accepted: Orders

## 2012-11-14 NOTE — Progress Notes (Signed)
[facility] [phone]   Fax:([phone]  OFFICE PROGRESS NOTE  [name],[name], MD [address] [place] [address]  DIAGNOSIS: : Stage IIIA nonsmall cell lung cancer, adenocarcinoma, in a never smoker patient with positive EGFR mutation diagnosed in January 2010.   PRIOR THERAPY: Status post concurrent chemoradiation with weekly carboplatin and paclitaxel; last dose given March 21, 2009.   CURRENT THERAPY: Tarceva at 150 mg p.o. daily, status post 43.5 months of treatment.  INTERVAL HISTORY: Dana [name] 77 y.o. female returns to the clinic today for followup visit.she complains of fatigue for the past 3 weeks. She on occasion has felt uncoordinated and has had difficulty concentrating or focusing. She feels that the difficulty with coordination and concentration may be related to the level of fatigue. She has been sleeping a lot more.  She denied having any significant chest pain, shortness breath, cough or hemoptysis. She has no significant weight loss or night sweats. She voiced no other specific complaints. Currently both the skin rash and diarrhea related to the Tarceva are both under control.   MEDICAL HISTORY: Past Medical History  Diagnosis Date  . Hemorrhoid   . COPD (chronic obstructive pulmonary disease)   . Shortness of breath   . GERD (gastroesophageal reflux disease)   . Arthritis   . Cancer     lung, currently being treated with Oral med    ALLERGIES:  is allergic to doxycycline and amoxicillin.  MEDICATIONS:  Current Outpatient Prescriptions  Medication Sig Dispense Refill  . aspirin EC 81 MG tablet Take 81 mg by mouth daily.      . calcium carbonate (OS-CAL) 600 MG TABS Take 600 mg by mouth 2 (two) times daily with a meal.        . cholecalciferol (VITAMIN D) 1000 UNITS tablet Take 2,000 Units by mouth 2 (two) times daily.      . ciclopirox (PENLAC) 8 % solution       . co-enzyme Q-10 30 MG capsule Take 30 mg by  mouth daily.      Marland Kitchen conjugated estrogens (PREMARIN) vaginal cream Place vaginally once a week.        . Cyanocobalamin (B-12) 1000 MCG CAPS Take 1,000 mcg by mouth 3 (three) times daily.       . diphenoxylate-atropine (LOMOTIL) 2.5-0.025 MG per tablet       . erlotinib (TARCEVA) 150 MG tablet Take 1 tablet (150 mg total) by mouth daily. Take on an empty stomach 1 hour before meals or 2 hours after.  30 tablet  2  . loratadine (CLARITIN) 10 MG tablet Take 10 mg by mouth daily.        Marland Kitchen LORazepam (ATIVAN) 0.5 MG tablet Take 1 tablet (0.5 mg total) by mouth every 8 (eight) hours as needed.  30 tablet  0  . LOTEMAX 0.5 % GEL       . Multiple Vitamin (MULTIVITAMIN) tablet Take 1 tablet by mouth daily.        . nabumetone (RELAFEN) 500 MG tablet Take 250 mg by mouth daily as needed. For pain       . omeprazole (PRILOSEC) 10 MG capsule Take 20 mg by mouth daily.       Bertram Gala Glycol-Propyl Glycol (SYSTANE OP) Place 1 drop into both eyes as needed. At night for dry eyes      . tiotropium (SPIRIVA) 18 MCG inhalation capsule Place 18 mcg into inhaler and inhale daily.  SURGICAL HISTORY:  Past Surgical History  Procedure Date  . Breast surgery 02/1999    left nipple discharge/ Central duct excision  . Anal fissure repair 07/09/2011  . Knee arthroscopy     R  . Abdominal hysterectomy   . Thumb arthroscopy     L - removed bone spur  . Tonsillectomy   . Appendectomy   . US echocardiography   . Thoracoscopy     w/lung biopsy  . Cardiovascular stress test     REVIEW OF SYSTEMS:  A comprehensive review of systems was negative except for: Constitutional: positive for fatigue Neurological: positive for coordination problems and Difficulty concentrating/focusing   PHYSICAL EXAMINATION: General appearance: alert, cooperative and no distress Neck: no adenopathy Lymph nodes: Cervical, supraclavicular, and axillary nodes normal. Resp: clear to auscultation bilaterally Cardio: regular rate  and rhythm, S1, S2 normal, no murmur, click, rub or gallop GI: soft, non-tender; bowel sounds normal; no masses,  no organomegaly Extremities: extremities normal, atraumatic, no cyanosis or edema  ECOG PERFORMANCE STATUS: 0 - Asymptomatic  Blood pressure 107/65, pulse 94, temperature 97.1 F (36.2 C), temperature source Oral, resp. rate 18, height 5' 3.5" (1.613 m), weight 132 lb 11.2 oz (60.192 kg).  LABORATORY DATA: Lab Results  Component Value Date   WBC 6.9 11/10/2012   HGB 12.9 11/10/2012   HCT 38.8 11/10/2012   MCV 94.6 11/10/2012   PLT 263 11/10/2012      Chemistry      Component Value Date/Time   NA 141 11/10/2012 1015   NA 141 07/31/2012 0936   NA 145 11/09/2011 1345   K 4.2 11/10/2012 1015   K 3.8 07/31/2012 0936   K 3.9 11/09/2011 1345   CL 108* 11/10/2012 1015   CL 107 07/31/2012 0936   CL 104 11/09/2011 1345   CO2 28 11/10/2012 1015   CO2 26 07/31/2012 0936   CO2 28 11/09/2011 1345   BUN 26.0 11/10/2012 1015   BUN 18 07/31/2012 0936   BUN 20 11/09/2011 1345   CREATININE 0.9 11/10/2012 1015   CREATININE 0.79 07/31/2012 0936   CREATININE 0.6 11/09/2011 1345      Component Value Date/Time   CALCIUM 9.5 11/10/2012 1015   CALCIUM 8.9 07/31/2012 0936   CALCIUM 8.7 11/09/2011 1345   ALKPHOS 54 11/10/2012 1015   ALKPHOS 48 07/31/2012 0936   ALKPHOS 52 11/09/2011 1345   AST 21 11/10/2012 1015   AST 20 07/31/2012 0936   AST 20 11/09/2011 1345   ALT 22 11/10/2012 1015   ALT 15 07/31/2012 0936   BILITOT 0.75 11/10/2012 1015   BILITOT 0.7 07/31/2012 0936   BILITOT 0.60 11/09/2011 1345       RADIOGRAPHIC STUDIES: Ct Chest W Contrast  09/26/2012  *RADIOLOGY REPORT*  Clinical Data: Lung cancer, chemotherapy and XRT complete  CT CHEST WITH CONTRAST  Technique:  Multidetector CT imaging of the chest was performed following the standard protocol during bolus administration of intravenous contrast.  Contrast: 80mL OMNIPAQUE IOHEXOL 300 MG/ML  SOLN  Comparison: 07/18/2012  Findings:  Moderate right pleural effusion, mildly decreased.  Right paramediastinal radiation changes. Stable 3 mm nodule in the medial right lower lobe (series 5/image 44).  Additional minimal nodularity in the right upper lobe, unchanged.  No new/suspicious pulmonary nodules.  No pneumothorax.  Visualized thyroid is unremarkable.  The heart is normal in size.  Trace anterior pericardial fluid.  Small mediastinal lymph nodes which do not meet pathologic CT size criteria, unchanged.  No suspicious  hilar or axillary lymphadenopathy.  Visualized upper abdomen is unremarkable.  Degenerative changes of the visualized thoracolumbar spine.  IMPRESSION: Right paramediastinal radiation changes.  No evidence of recurrent or metastatic disease in the chest.  Moderate right pleural effusion, decreased.   Original Report Authenticated By: Charline Bills, M.D.     ASSESSMENT/PLAN: This is a very pleasant 77 years old white female with history of stage IIIa non-small cell lung cancer, adenocarcinoma with positive EGFR mutation status post concurrent chemoradiation and currently on Tarceva 150 mg by mouth daily for the last 43.5 months. The patient is tolerating her treatment fairly well and she has no evidence for disease progression. The patient was discussed with Dr. Arbutus Ped. We'll check a TSH to ensure that an abnormality of her thyroid is not the cause of her increased fatigue. The result will be sent her primary care physician for her records and her further management should be resolved be abnormal. We will have the patient continue to monitor these symptoms and seek evaluation should they persist or worsen. She will otherwise followup with Dr. Arbutus Ped in 6 weeks for another symptom management visit with a repeat CBC differential, C. met and CT of the chest with contrast to reevaluate her disease.  Laural Benes, Joscelyn Hardrick E, PA-C   All questions were answered. The patient knows to call the clinic with any problems, questions or  concerns. We can certainly see the patient much sooner if necessary.  I spent 20 minutes counseling the patient face to face. The total time spent in the appointment was 30 minutes.

## 2012-11-18 ENCOUNTER — Telehealth: Payer: Self-pay | Admitting: Medical Oncology

## 2012-11-18 ENCOUNTER — Telehealth: Payer: Self-pay | Admitting: Internal Medicine

## 2012-11-18 DIAGNOSIS — C3490 Malignant neoplasm of unspecified part of unspecified bronchus or lung: Secondary | ICD-10-CM

## 2012-11-18 NOTE — Telephone Encounter (Signed)
s/w husband and he is aware of her new appt times   anne

## 2012-11-18 NOTE — Telephone Encounter (Signed)
Confused as to why she is having another CT scan 6 weeks after another one

## 2012-11-18 NOTE — Telephone Encounter (Signed)
RECEIVED A FAX FROM [facility] CONCERNING A CONFIRMATION OF PRESCRIPTION SHIPMENT FOR TARCEVA ON 11/17/12.

## 2012-11-18 NOTE — Telephone Encounter (Signed)
Per Dr Genia Del does not need a CT scan 12/19/12 but she needs one in 3 months-pt notified to cancel Ct appt on 12/19/12. OTR ( onc treatment request) sent.

## 2012-11-19 ENCOUNTER — Telehealth: Payer: Self-pay | Admitting: Internal Medicine

## 2012-11-19 ENCOUNTER — Telehealth: Payer: Self-pay | Admitting: *Deleted

## 2012-11-19 NOTE — Telephone Encounter (Signed)
Cancelled patient's scan for 12-19-2012 rescheduled for 12-26-2012 lab and scan 12-29-2012 md only appointment  Patient will keep her appointment for 12-22-2012 according to the orders from 11-18-2012

## 2012-11-19 NOTE — Telephone Encounter (Signed)
Pt was forwarded to me from [facility] re questions she had about the way her appts are scheduled. appts were scheduled according to 11/24 and 11/26 pof's. S/w desk nurse and appts are correct and pt is to [facility] appts as they are. Per desk nurse lb/MM for 12/30 is a symptom management appt and pt is to keep lb/ct for 12/26/12 and f/u for 12/29/12. Pt given info from nurse and all three appt d/t/descriptions were confirmed with patient. Tiffany @ [facility] also informed and made aware that she does not need to make any changes to ct appt.

## 2012-12-02 ENCOUNTER — Ambulatory Visit: Payer: Medicare Other | Admitting: Internal Medicine

## 2012-12-19 ENCOUNTER — Other Ambulatory Visit: Payer: Self-pay | Admitting: *Deleted

## 2012-12-19 ENCOUNTER — Other Ambulatory Visit (HOSPITAL_COMMUNITY): Payer: Medicare Other

## 2012-12-22 ENCOUNTER — Ambulatory Visit: Payer: Medicare Other | Admitting: Physician Assistant

## 2012-12-22 ENCOUNTER — Encounter: Payer: Self-pay | Admitting: Internal Medicine

## 2012-12-22 ENCOUNTER — Other Ambulatory Visit (HOSPITAL_BASED_OUTPATIENT_CLINIC_OR_DEPARTMENT_OTHER): Payer: Medicare Other | Admitting: Lab

## 2012-12-22 ENCOUNTER — Other Ambulatory Visit: Payer: Medicare Other | Admitting: Lab

## 2012-12-22 ENCOUNTER — Telehealth: Payer: Self-pay | Admitting: Internal Medicine

## 2012-12-22 ENCOUNTER — Ambulatory Visit (HOSPITAL_BASED_OUTPATIENT_CLINIC_OR_DEPARTMENT_OTHER): Payer: Medicare Other | Admitting: Internal Medicine

## 2012-12-22 VITALS — BP 142/85 | HR 91 | Temp 96.8°F | Resp 18 | Ht 63.5 in | Wt 131.8 lb

## 2012-12-22 DIAGNOSIS — C3490 Malignant neoplasm of unspecified part of unspecified bronchus or lung: Secondary | ICD-10-CM

## 2012-12-22 DIAGNOSIS — L299 Pruritus, unspecified: Secondary | ICD-10-CM

## 2012-12-22 DIAGNOSIS — C342 Malignant neoplasm of middle lobe, bronchus or lung: Secondary | ICD-10-CM

## 2012-12-22 LAB — COMPREHENSIVE METABOLIC PANEL (CC13)
ALT: 21 U/L (ref 0–55)
AST: 23 U/L (ref 5–34)
Albumin: 3.2 g/dL — ABNORMAL LOW (ref 3.5–5.0)
CO2: 28 mEq/L (ref 22–29)
Calcium: 9.2 mg/dL (ref 8.4–10.4)
Chloride: 107 mEq/L (ref 98–107)
Creatinine: 0.8 mg/dL (ref 0.6–1.1)
Potassium: 4.5 mEq/L (ref 3.5–5.1)

## 2012-12-22 LAB — CBC WITH DIFFERENTIAL/PLATELET
BASO%: 0.4 % (ref 0.0–2.0)
Basophils Absolute: 0 10*3/uL (ref 0.0–0.1)
EOS%: 3.7 % (ref 0.0–7.0)
HCT: 39.7 % (ref 34.8–46.6)
HGB: 13.5 g/dL (ref 11.6–15.9)
MONO#: 0.6 10*3/uL (ref 0.1–0.9)
NEUT%: 73.4 % (ref 38.4–76.8)
RDW: 13.7 % (ref 11.2–14.5)
WBC: 5.2 10*3/uL (ref 3.9–10.3)
lymph#: 0.6 10*3/uL — ABNORMAL LOW (ref 0.9–3.3)

## 2012-12-22 NOTE — Patient Instructions (Signed)
Continue current treatment with Tarceva for now. Repeat CT scan of the chest next week. Followup and 6 weeks.

## 2012-12-22 NOTE — Telephone Encounter (Signed)
Gave pt appt for January lab and ML, Ct on 12/26/12

## 2012-12-22 NOTE — Progress Notes (Signed)
[facility] Telephone:(336) [phone]   Fax:([phone]  OFFICE PROGRESS NOTE  [name],[name], MD [address] [place] [address]  DIAGNOSIS: : Stage IIIA nonsmall cell lung cancer, adenocarcinoma, in a never smoker patient with positive EGFR mutation diagnosed in January 2010.   PRIOR THERAPY: Status post concurrent chemoradiation with weekly carboplatin and paclitaxel; last dose given March 21, 2009.   CURRENT THERAPY: Tarceva at 150 mg p.o. daily, status post 45 months of treatment.  INTERVAL HISTORY: Dana [name] 77 y.o. female returns to the clinic today for followup visit. The patient is feeling fine today with no specific complaints she is tolerating her treatment with Tarceva fairly well with no significant adverse effects. She denied having any significant chest pain, shortness breath, cough or hemoptysis. She denied having any significant weight loss or night sweats. The patient denied having any significant skin rash or diarrhea. She continues to have dry skin and occasional itching especially recently. She is scheduled to have repeat CT scan of the chest next week.  MEDICAL HISTORY: Past Medical History  Diagnosis Date  . Hemorrhoid   . COPD (chronic obstructive pulmonary disease)   . Shortness of breath   . GERD (gastroesophageal reflux disease)   . Arthritis   . Cancer     lung, currently being treated with Oral med    ALLERGIES:  is allergic to doxycycline and amoxicillin.  MEDICATIONS:  Current Outpatient Prescriptions  Medication Sig Dispense Refill  . aspirin EC 81 MG tablet Take 81 mg by mouth daily.      . calcium carbonate (OS-CAL) 600 MG TABS Take 600 mg by mouth 2 (two) times daily with a meal.        . cholecalciferol (VITAMIN D) 1000 UNITS tablet Take 2,000 Units by mouth 2 (two) times daily.      . ciclopirox (PENLAC) 8 % solution       . co-enzyme Q-10 30 MG capsule Take 30 mg by mouth daily.      Marland Kitchen conjugated estrogens  (PREMARIN) vaginal cream Place vaginally once a week.        . Cyanocobalamin (B-12) 1000 MCG CAPS Take 1,000 mcg by mouth 3 (three) times daily.       . diphenoxylate-atropine (LOMOTIL) 2.5-0.025 MG per tablet       . erlotinib (TARCEVA) 150 MG tablet Take 1 tablet (150 mg total) by mouth daily. Take on an empty stomach 1 hour before meals or 2 hours after.  30 tablet  2  . loratadine (CLARITIN) 10 MG tablet Take 10 mg by mouth daily.        Marland Kitchen LORazepam (ATIVAN) 0.5 MG tablet Take 1 tablet (0.5 mg total) by mouth every 8 (eight) hours as needed.  30 tablet  0  . LOTEMAX 0.5 % GEL       . Multiple Vitamin (MULTIVITAMIN) tablet Take 1 tablet by mouth daily.        . nabumetone (RELAFEN) 500 MG tablet Take 250 mg by mouth daily as needed. For pain       . omeprazole (PRILOSEC) 10 MG capsule Take 20 mg by mouth daily.       Bertram Gala Glycol-Propyl Glycol (SYSTANE OP) Place 1 drop into both eyes as needed. At night for dry eyes      . tiotropium (SPIRIVA) 18 MCG inhalation capsule Place 18 mcg into inhaler and inhale daily.  SURGICAL HISTORY:  Past Surgical History  Procedure Date  . Breast surgery 02/1999    left nipple discharge/ Central duct excision  . Anal fissure repair 07/09/2011  . Knee arthroscopy     R  . Abdominal hysterectomy   . Thumb arthroscopy     L - removed bone spur  . Tonsillectomy   . Appendectomy   . US echocardiography   . Thoracoscopy     w/lung biopsy  . Cardiovascular stress test     REVIEW OF SYSTEMS:  A comprehensive review of systems was negative.   PHYSICAL EXAMINATION: General appearance: alert, cooperative and no distress Head: Normocephalic, without obvious abnormality, atraumatic Neck: no adenopathy Lymph nodes: Cervical, supraclavicular, and axillary nodes normal. Resp: clear to auscultation bilaterally Cardio: regular rate and rhythm, S1, S2 normal, no murmur, click, rub or gallop GI: soft, non-tender; bowel sounds normal; no masses,   no organomegaly Extremities: extremities normal, atraumatic, no cyanosis or edema Neurologic: Alert and oriented X 3, normal strength and tone. Normal symmetric reflexes. Normal coordination and gait  ECOG PERFORMANCE STATUS: 0 - Asymptomatic  Blood pressure 142/85, pulse 91, temperature 96.8 F (36 C), temperature source Oral, resp. rate 18, height 5' 3.5" (1.613 m), weight 131 lb 12.8 oz (59.784 kg).  LABORATORY DATA: Lab Results  Component Value Date   WBC 5.2 12/22/2012   HGB 13.5 12/22/2012   HCT 39.7 12/22/2012   MCV 95.9 12/22/2012   PLT 241 12/22/2012      Chemistry      Component Value Date/Time   NA 142 12/22/2012 1033   NA 141 07/31/2012 0936   NA 145 11/09/2011 1345   K 4.5 12/22/2012 1033   K 3.8 07/31/2012 0936   K 3.9 11/09/2011 1345   CL 107 12/22/2012 1033   CL 107 07/31/2012 0936   CL 104 11/09/2011 1345   CO2 28 12/22/2012 1033   CO2 26 07/31/2012 0936   CO2 28 11/09/2011 1345   BUN 18.0 12/22/2012 1033   BUN 18 07/31/2012 0936   BUN 20 11/09/2011 1345   CREATININE 0.8 12/22/2012 1033   CREATININE 0.79 07/31/2012 0936   CREATININE 0.6 11/09/2011 1345      Component Value Date/Time   CALCIUM 9.2 12/22/2012 1033   CALCIUM 8.9 07/31/2012 0936   CALCIUM 8.7 11/09/2011 1345   ALKPHOS 61 12/22/2012 1033   ALKPHOS 48 07/31/2012 0936   ALKPHOS 52 11/09/2011 1345   AST 23 12/22/2012 1033   AST 20 07/31/2012 0936   AST 20 11/09/2011 1345   ALT 21 12/22/2012 1033   ALT 15 07/31/2012 0936   BILITOT 0.75 12/22/2012 1033   BILITOT 0.7 07/31/2012 0936   BILITOT 0.60 11/09/2011 1345       RADIOGRAPHIC STUDIES: No results found.  ASSESSMENT: This is a very pleasant 77 years old white female with stage IIIa non-small cell lung cancer currently on treatment with Tarceva status post 45 months and tolerating it fairly well.  PLAN: The patient will continue on her current treatment with Tarceva for now. She would have a restaging scan of the chest performed next week. If no  evidence for disease progression on the upcoming scan, she would come back for followup visit in 6 weeks for evaluation and management any adverse effect of her treatment. She was advised to call immediately if she has any concerning symptoms in the interval. For itching, the patient was advised to use Benadryl 25 mg by mouth twice a day as needed.  All questions were answered. The patient knows to call the clinic with any problems, questions or concerns. We can certainly see the patient much sooner if necessary.  I spent 15 minutes counseling the patient face to face. The total time spent in the appointment was 25 minutes.

## 2012-12-26 ENCOUNTER — Ambulatory Visit (HOSPITAL_COMMUNITY)
Admission: RE | Admit: 2012-12-26 | Discharge: 2012-12-26 | Disposition: A | Discharge status: Home / Self Care | Payer: Medicare Other | Source: Ambulatory Visit | Attending: Physician Assistant | Admitting: Physician Assistant

## 2012-12-26 ENCOUNTER — Other Ambulatory Visit: Payer: Medicare Other

## 2012-12-26 DIAGNOSIS — C3490 Malignant neoplasm of unspecified part of unspecified bronchus or lung: Secondary | ICD-10-CM | POA: Insufficient documentation

## 2012-12-26 DIAGNOSIS — Z9221 Personal history of antineoplastic chemotherapy: Secondary | ICD-10-CM | POA: Insufficient documentation

## 2012-12-26 DIAGNOSIS — Z923 Personal history of irradiation: Secondary | ICD-10-CM | POA: Insufficient documentation

## 2012-12-26 MED ORDER — IOHEXOL 300 MG/ML  SOLN
80.0000 mL | Freq: Once | INTRAMUSCULAR | Status: AC | PRN
  Administered 2012-12-26: 80 mL via INTRAVENOUS

## 2012-12-29 ENCOUNTER — Ambulatory Visit: Payer: Medicare Other | Admitting: Internal Medicine

## 2013-01-06 ENCOUNTER — Encounter (HOSPITAL_COMMUNITY): Payer: Self-pay | Admitting: *Deleted

## 2013-01-06 ENCOUNTER — Emergency Department (HOSPITAL_COMMUNITY)
Admission: EM | Admit: 2013-01-06 | Discharge: 2013-01-06 | Disposition: A | Discharge status: Home / Self Care | Payer: Medicare Other | Attending: Emergency Medicine | Admitting: Emergency Medicine

## 2013-01-06 DIAGNOSIS — J4489 Other specified chronic obstructive pulmonary disease: Secondary | ICD-10-CM | POA: Insufficient documentation

## 2013-01-06 DIAGNOSIS — R404 Transient alteration of awareness: Secondary | ICD-10-CM | POA: Insufficient documentation

## 2013-01-06 DIAGNOSIS — Z8739 Personal history of other diseases of the musculoskeletal system and connective tissue: Secondary | ICD-10-CM | POA: Insufficient documentation

## 2013-01-06 DIAGNOSIS — R5381 Other malaise: Secondary | ICD-10-CM | POA: Insufficient documentation

## 2013-01-06 DIAGNOSIS — R5383 Other fatigue: Secondary | ICD-10-CM | POA: Insufficient documentation

## 2013-01-06 DIAGNOSIS — R42 Dizziness and giddiness: Secondary | ICD-10-CM | POA: Insufficient documentation

## 2013-01-06 DIAGNOSIS — N390 Urinary tract infection, site not specified: Secondary | ICD-10-CM | POA: Insufficient documentation

## 2013-01-06 DIAGNOSIS — R52 Pain, unspecified: Secondary | ICD-10-CM | POA: Insufficient documentation

## 2013-01-06 DIAGNOSIS — R6883 Chills (without fever): Secondary | ICD-10-CM | POA: Insufficient documentation

## 2013-01-06 DIAGNOSIS — Z87891 Personal history of nicotine dependence: Secondary | ICD-10-CM | POA: Insufficient documentation

## 2013-01-06 DIAGNOSIS — Z7982 Long term (current) use of aspirin: Secondary | ICD-10-CM | POA: Insufficient documentation

## 2013-01-06 DIAGNOSIS — J449 Chronic obstructive pulmonary disease, unspecified: Secondary | ICD-10-CM | POA: Insufficient documentation

## 2013-01-06 DIAGNOSIS — C3490 Malignant neoplasm of unspecified part of unspecified bronchus or lung: Secondary | ICD-10-CM | POA: Insufficient documentation

## 2013-01-06 DIAGNOSIS — R55 Syncope and collapse: Secondary | ICD-10-CM | POA: Insufficient documentation

## 2013-01-06 DIAGNOSIS — K219 Gastro-esophageal reflux disease without esophagitis: Secondary | ICD-10-CM | POA: Insufficient documentation

## 2013-01-06 DIAGNOSIS — Z79899 Other long term (current) drug therapy: Secondary | ICD-10-CM | POA: Insufficient documentation

## 2013-01-06 DIAGNOSIS — R197 Diarrhea, unspecified: Secondary | ICD-10-CM | POA: Insufficient documentation

## 2013-01-06 LAB — COMPREHENSIVE METABOLIC PANEL
AST: 28 U/L (ref 0–37)
BUN: 16 mg/dL (ref 6–23)
CO2: 27 mEq/L (ref 19–32)
Calcium: 9.5 mg/dL (ref 8.4–10.5)
Chloride: 102 mEq/L (ref 96–112)
Creatinine, Ser: 0.92 mg/dL (ref 0.50–1.10)
GFR calc Af Amer: 69 mL/min — ABNORMAL LOW (ref >90)
GFR calc non Af Amer: 59 mL/min — ABNORMAL LOW (ref >90)
Glucose, Bld: 97 mg/dL (ref 70–99)
Total Bilirubin: 1.2 mg/dL (ref 0.3–1.2)

## 2013-01-06 LAB — URINALYSIS, ROUTINE W REFLEX MICROSCOPIC
Glucose, UA: NEGATIVE mg/dL
Hgb urine dipstick: NEGATIVE
Ketones, ur: 15 mg/dL — AB
Protein, ur: 100 mg/dL — AB
pH: 5.5 (ref 5.0–8.0)

## 2013-01-06 LAB — CBC WITH DIFFERENTIAL/PLATELET
Basophils Absolute: 0 10*3/uL (ref 0.0–0.1)
Basophils Relative: 0 % (ref 0–1)
HCT: 39.3 % (ref 36.0–46.0)
Lymphocytes Relative: 8 % — ABNORMAL LOW (ref 12–46)
MCHC: 34.6 g/dL (ref 30.0–36.0)
Neutro Abs: 9.2 10*3/uL — ABNORMAL HIGH (ref 1.7–7.7)
Neutrophils Relative %: 85 % — ABNORMAL HIGH (ref 43–77)
Platelets: 289 10*3/uL (ref 150–400)
RDW: 13.7 % (ref 11.5–15.5)
WBC: 10.9 10*3/uL — ABNORMAL HIGH (ref 4.0–10.5)

## 2013-01-06 LAB — URINE MICROSCOPIC-ADD ON

## 2013-01-06 MED ORDER — SULFAMETHOXAZOLE-TRIMETHOPRIM 800-160 MG PO TABS: 1.0000 | ORAL_TABLET | Freq: Two times a day (BID) | ORAL | Status: DC

## 2013-01-06 MED ORDER — SODIUM CHLORIDE 0.9 % IV BOLUS (SEPSIS)
1000.0000 mL | Freq: Once | INTRAVENOUS | Status: AC
  Administered 2013-01-06: 1000 mL via INTRAVENOUS

## 2013-01-06 NOTE — ED Notes (Addendum)
Pt from home with reports of diarrhea off and on since around Christmas that has worsened since Saturday. Pt also reports that she had a syncopal episode Saturday night in the bathroom floor. Pt endorses hx of lung cancer, diagnosed 4 years ago and takes Tarceva which is known to cause diarrhea. Pt also reports generalized body aches and intermittent chills since Saturday.

## 2013-01-06 NOTE — ED Provider Notes (Signed)
History     CSN: [id number]  Arrival date & time 01/06/13  0814   First MD Initiated Contact with Patient 01/06/13 [phone]      Chief Complaint  Patient presents with  . Diarrhea  . Loss of Consciousness  . Chills  . Generalized Body Aches    (Consider location/radiation/quality/duration/timing/severity/associated sxs/prior treatment) Patient is a 78 y.o. female presenting with diarrhea and syncope. The history is provided by the patient.  Diarrhea The primary symptoms include fatigue and diarrhea. Primary symptoms do not include abdominal pain, nausea, vomiting or rash.  The illness does not include back pain.  Loss of Consciousness Pertinent negatives include no chest pain, no abdominal pain, no headaches and no shortness of breath.   patient has had diarrhea on and off for the last few years since she's been on Tarceva. She's been worse the last few days. She states that anytime she eats or drinks anything she has diarrhea. It is watery diarrhea. She states is like her previous C. difficile. She's not been on any advice recently. She states that normally she is able to take Imodium and also diarrhea, however now it is not. No fevers. No cough. No vomiting. Mild crampy abdominal pain. She states that she passed out last night. She states she woke up on the floor the bathroom. No injury. No chest pain. No blood in the stool. She's previously required oral vancomycin for her 2 episodes of C. difficile.  Past Medical History  Diagnosis Date  . Hemorrhoid   . COPD (chronic obstructive pulmonary disease)   . Shortness of breath   . GERD (gastroesophageal reflux disease)   . Arthritis   . Cancer     lung, currently being treated with Oral med    Past Surgical History  Procedure Date  . Breast surgery 02/1999    left nipple discharge/ Central duct excision  . Anal fissure repair 07/09/2011  . Knee arthroscopy     R  . Abdominal hysterectomy   . Thumb arthroscopy     L - removed  bone spur  . Tonsillectomy   . Appendectomy   . US echocardiography   . Thoracoscopy     w/lung biopsy  . Cardiovascular stress test     Family History  Problem Relation Age of Onset  . Cancer Father     bladder  . Cancer Son     kidney - removed as a teenager    History  Substance Use Topics  . Smoking status: Former Smoker -- 1.0 packs/day for 30 years  . Smokeless tobacco: Never Used  . Alcohol Use: Yes     Comment: occasional    OB History    Grav Para Term Preterm Abortions TAB SAB Ect Mult Living                  Review of Systems  Constitutional: Positive for fatigue. Negative for activity change and appetite change.  HENT: Negative for neck stiffness.   Eyes: Negative for pain.  Respiratory: Negative for chest tightness and shortness of breath.   Cardiovascular: Positive for syncope. Negative for chest pain and leg swelling.  Gastrointestinal: Positive for diarrhea. Negative for nausea, vomiting and abdominal pain.  Genitourinary: Negative for flank pain.  Musculoskeletal: Negative for back pain.  Skin: Negative for rash.  Neurological: Positive for light-headedness. Negative for weakness, numbness and headaches.  Psychiatric/Behavioral: Negative for behavioral problems.    Allergies  Doxycycline and Amoxicillin  Home Medications  Current Outpatient Rx  Name  Route  Sig  Dispense  Refill  . ASPIRIN EC 81 MG PO TBEC   Oral   Take 81 mg by mouth daily.         Marland Kitchen CALCIUM CARBONATE 600 MG PO TABS   Oral   Take 600 mg by mouth 2 (two) times daily with a meal.           . VITAMIN D 1000 UNITS PO TABS   Oral   Take 2,000 Units by mouth 2 (two) times daily.         Marland Kitchen CICLOPIROX 8 % EX SOLN               . COENZYME Q10 30 MG PO CAPS   Oral   Take 30 mg by mouth daily.         Marland Kitchen ESTROGENS, CONJUGATED 0.625 MG/GM VA CREA   Vaginal   Place vaginally once a week.           . B-12 1000 MCG PO CAPS   Oral   Take 1,000 mcg by mouth 3  (three) times daily.          Marland Kitchen DIPHENOXYLATE-ATROPINE 2.5-0.025 MG PO TABS   Oral   Take 1 tablet by mouth 4 (four) times daily as needed.          . ERLOTINIB 150 MG PO TABS   Oral   Take 1 tablet (150 mg total) by mouth daily. Take on an empty stomach 1 hour before meals or 2 hours after.   30 tablet   2   . LOPERAMIDE HCL 2 MG PO TABS   Oral   Take 2 mg by mouth 4 (four) times daily as needed. diarrhea         . LORATADINE 10 MG PO TABS   Oral   Take 10 mg by mouth daily.           Marland Kitchen LORAZEPAM 0.5 MG PO TABS   Oral   Take 1 tablet (0.5 mg total) by mouth every 8 (eight) hours as needed.   30 tablet   0   . ONE-DAILY MULTI VITAMINS PO TABS   Oral   Take 1 tablet by mouth daily.           Marland Kitchen NABUMETONE 500 MG PO TABS   Oral   Take 250 mg by mouth daily as needed. For pain         . SYSTANE OP   Both Eyes   Place 1 drop into both eyes as needed. At night for dry eyes         . TIOTROPIUM BROMIDE MONOHYDRATE 18 MCG IN CAPS   Inhalation   Place 18 mcg into inhaler and inhale daily.           Marland Kitchen OMEPRAZOLE 10 MG PO CPDR   Oral   Take 20 mg by mouth daily.          . SULFAMETHOXAZOLE-TRIMETHOPRIM 800-160 MG PO TABS   Oral   Take 1 tablet by mouth 2 (two) times daily.   6 tablet   0     BP 109/53  Pulse 86  Temp 98 F (36.7 C) (Axillary)  Resp 18  SpO2 98%  Physical Exam  Nursing note and vitals reviewed. Constitutional: She is oriented to person, place, and time. She appears well-developed and well-nourished.  HENT:  Head: Normocephalic and atraumatic.  Eyes: EOM are normal. Pupils  are equal, round, and reactive to light.  Neck: Normal range of motion. Neck supple.  Cardiovascular: Normal rate, regular rhythm and normal heart sounds.   No murmur heard. Pulmonary/Chest: Effort normal and breath sounds normal. No respiratory distress. She has no wheezes. She has no rales.  Abdominal: Soft. Bowel sounds are normal. She exhibits no  distension. There is no tenderness. There is no rebound and no guarding.       Hyperactive bowel sounds without tenderness  Musculoskeletal: Normal range of motion.  Neurological: She is alert and oriented to person, place, and time. No cranial nerve deficit.  Skin: Skin is warm and dry.  Psychiatric: She has a normal mood and affect. Her speech is normal.    ED Course  Procedures (including critical care time)  Labs Reviewed  CBC WITH DIFFERENTIAL - Abnormal; Notable for the following:    WBC 10.9 (*)     Neutrophils Relative 85 (*)     Neutro Abs 9.2 (*)     Lymphocytes Relative 8 (*)     All other components within normal limits  COMPREHENSIVE METABOLIC PANEL - Abnormal; Notable for the following:    Potassium 3.2 (*)     Albumin 3.2 (*)     GFR calc non Af Amer 59 (*)     GFR calc Af Amer 69 (*)     All other components within normal limits  URINALYSIS, ROUTINE W REFLEX MICROSCOPIC - Abnormal; Notable for the following:    Color, Urine ORANGE (*)  BIOCHEMICALS MAY BE AFFECTED BY COLOR   APPearance TURBID (*)     Bilirubin Urine MODERATE (*)     Ketones, ur 15 (*)     Protein, ur 100 (*)     Nitrite POSITIVE (*)     Leukocytes, UA LARGE (*)     All other components within normal limits  URINE MICROSCOPIC-ADD ON - Abnormal; Notable for the following:    Bacteria, UA MANY (*)     Casts GRANULAR CAST (*)     All other components within normal limits  STOOL CULTURE  CLOSTRIDIUM DIFFICILE BY PCR  URINE CULTURE   No results found.   1. Diarrhea   2. UTI (urinary tract infection)       MDM  Patient with diarrhea. History same with both Tarceva and C. Difficile.stool culture sent.  C. difficile sent. Minimal hypokalemia. Pulse was UTI. She'll be treated empirically with oral vancomycin since she has a history of C. difficile. She'll follow with her primary care Dr.   Juliet Rude. Rubin Payor, MD 01/06/13 1549

## 2013-01-09 LAB — URINE CULTURE: Colony Count: >=100000

## 2013-01-10 LAB — STOOL CULTURE

## 2013-01-10 NOTE — ED Notes (Signed)
+  Urine. Patient treated with Septra DS. Sensitive to same. Per protocol MD. °

## 2013-01-20 ENCOUNTER — Encounter: Payer: Self-pay | Admitting: *Deleted

## 2013-01-20 NOTE — Progress Notes (Signed)
RECEIVED A FAX FROM [facility] CONCERNING A CONFIRMATION OF PRESCRIPTION SHIPMENT FOR TARCEVA ON 01/19/13.

## 2013-02-02 ENCOUNTER — Ambulatory Visit: Payer: Medicare Other | Admitting: Physician Assistant

## 2013-02-02 ENCOUNTER — Other Ambulatory Visit: Payer: Medicare Other | Admitting: Lab

## 2013-02-03 ENCOUNTER — Other Ambulatory Visit: Payer: Self-pay | Admitting: Physician Assistant

## 2013-02-03 DIAGNOSIS — C3490 Malignant neoplasm of unspecified part of unspecified bronchus or lung: Secondary | ICD-10-CM

## 2013-02-04 ENCOUNTER — Telehealth: Payer: Self-pay | Admitting: Internal Medicine

## 2013-02-09 ENCOUNTER — Telehealth: Payer: Self-pay | Admitting: Medical Oncology

## 2013-02-09 NOTE — Telephone Encounter (Signed)
"   diarrhea since feb. " She reports 3-4 diarrhea stools since waking up today and 3-4 times last night . She is drinking pedialite, water , tea. (She went to ED last week for same and was negative for C Diff. She  saw Dr Dulce Sellar who prescribed " Uceris" not helping.) Asking if it is related to tarceva. I instructed Dana [name] to eat Bananas, rice applesauce and toast , cheese and to take imodium ad per package instructions.

## 2013-02-09 NOTE — Telephone Encounter (Signed)
Per Dr Arbutus Ped I told pt to stop tarvceva for now until-keep appt. With  [place] this week

## 2013-02-11 ENCOUNTER — Telehealth: Payer: Self-pay | Admitting: Physician Assistant

## 2013-02-11 ENCOUNTER — Ambulatory Visit (HOSPITAL_BASED_OUTPATIENT_CLINIC_OR_DEPARTMENT_OTHER): Payer: Medicare Other

## 2013-02-11 ENCOUNTER — Ambulatory Visit (HOSPITAL_BASED_OUTPATIENT_CLINIC_OR_DEPARTMENT_OTHER): Payer: Medicare Other | Admitting: Physician Assistant

## 2013-02-11 ENCOUNTER — Other Ambulatory Visit (HOSPITAL_BASED_OUTPATIENT_CLINIC_OR_DEPARTMENT_OTHER): Payer: Medicare Other | Admitting: Lab

## 2013-02-11 VITALS — BP 90/62 | HR 91 | Temp 97.5°F | Resp 20 | Ht 63.5 in | Wt 119.3 lb

## 2013-02-11 DIAGNOSIS — E876 Hypokalemia: Secondary | ICD-10-CM

## 2013-02-11 DIAGNOSIS — C3490 Malignant neoplasm of unspecified part of unspecified bronchus or lung: Secondary | ICD-10-CM

## 2013-02-11 DIAGNOSIS — R197 Diarrhea, unspecified: Secondary | ICD-10-CM

## 2013-02-11 DIAGNOSIS — C3430 Malignant neoplasm of lower lobe, unspecified bronchus or lung: Secondary | ICD-10-CM

## 2013-02-11 DIAGNOSIS — E860 Dehydration: Secondary | ICD-10-CM

## 2013-02-11 LAB — CBC WITH DIFFERENTIAL/PLATELET
Basophils Absolute: 0 10*3/uL (ref 0.0–0.1)
Eosinophils Absolute: 0.4 10*3/uL (ref 0.0–0.5)
HCT: 42.2 % (ref 34.8–46.6)
HGB: 14.1 g/dL (ref 11.6–15.9)
MCH: 31.1 pg (ref 25.1–34.0)
MCV: 93.2 fL (ref 79.5–101.0)
MONO%: 6 % (ref 0.0–14.0)
NEUT#: 6.1 10*3/uL (ref 1.5–6.5)
NEUT%: 70.7 % (ref 38.4–76.8)
RDW: 13.9 % (ref 11.2–14.5)
lymph#: 1.6 10*3/uL (ref 0.9–3.3)

## 2013-02-11 LAB — COMPREHENSIVE METABOLIC PANEL (CC13)
Albumin: 3.3 g/dL — ABNORMAL LOW (ref 3.5–5.0)
BUN: 22.3 mg/dL (ref 7.0–26.0)
Calcium: 9.5 mg/dL (ref 8.4–10.4)
Chloride: 104 mEq/L (ref 98–107)
Creatinine: 0.9 mg/dL (ref 0.6–1.1)
Glucose: 90 mg/dl (ref 70–99)
Potassium: 2.9 mEq/L — CL (ref 3.5–5.1)

## 2013-02-11 MED ORDER — POTASSIUM CHLORIDE CRYS ER 20 MEQ PO TBCR: 20.0000 meq | EXTENDED_RELEASE_TABLET | Freq: Two times a day (BID) | ORAL | Status: DC

## 2013-02-11 MED ORDER — SODIUM CHLORIDE 0.9 % IV SOLN
Freq: Once | INTRAVENOUS | Status: AC
  Administered 2013-02-11: 16:00:00 via INTRAVENOUS
  Filled 2013-02-11: qty 1000

## 2013-02-11 MED ORDER — LORAZEPAM 0.5 MG PO TABS: 0.5000 mg | ORAL_TABLET | Freq: Three times a day (TID) | ORAL | Status: DC | PRN

## 2013-02-11 MED ORDER — DIPHENOXYLATE-ATROPINE 2.5-0.025 MG PO TABS: 1.0000 | ORAL_TABLET | Freq: Four times a day (QID) | ORAL | Status: DC | PRN

## 2013-02-11 NOTE — Patient Instructions (Addendum)

## 2013-02-11 NOTE — Patient Instructions (Addendum)
Remain off of your Tarceva for 2 weeks Followup with Dr. Dulce Sellar intake medications as prescribed by him Call us in 2 weeks and let us know how your diarrhea is so that we can let you know when to resume your Tarceva Your potassium level is low and you will require potassium supplementation. A prescription has been sent to your pharmacy of record Your also dehydrated because of all of your diarrhea which is also causing your potassium level to be low. You will receive a liter of normal saline with potassium in it today to rehydrate 2 as well as start your potassium replacement. Followup in 3 weeks

## 2013-02-13 ENCOUNTER — Encounter: Payer: Self-pay | Admitting: Internal Medicine

## 2013-02-13 NOTE — Progress Notes (Signed)
[facility] Telephone:([phone]   [phone]  OFFICE PROGRESS NOTE  [name],[name], MD [address] [place] [address]  DIAGNOSIS: : Stage IIIA nonsmall cell lung cancer, adenocarcinoma, in a never smoker patient with positive EGFR mutation diagnosed in January 2010.   PRIOR THERAPY: Status post concurrent chemoradiation with weekly carboplatin and paclitaxel; last dose given March 21, 2009.   CURRENT THERAPY: Tarceva at 150 mg p.o. daily, status post approximately 46 months of treatment.  INTERVAL HISTORY: Dana [name] 78 y.o. female returns to the clinic today accompanied by her husband for followup visit. The patient is feeling fine today with no specific complaints she is tolerating her treatment with Tarceva fairly well.  She denied having any significant chest pain, shortness breath, cough or hemoptysis. She denied having any significant weight loss or night sweats. The patient denied having any significant skin rash. She reports that she has had diarrhea off and on since around Christmas time. The episodes that she had around the holiday were more consistent with side effects from Tarceva. In January however the character of the diarrhea changed and she was concerned that she was having another episode of C. difficile. She states that her husband found her on the floor the bathroom and she was taken to the emergency room where she was given IV fluids however the test for C. difficile was negative. She was told that she had a gastrointestinal virus. She's been seen by her gastroenterologist, Dr. Dulce [name], who placed her on Uceris which did not help. She currently is to followup with him on 02/23/2013 and is to try the samples of Creon in the interim. She has lost weight because everything she seems to go right through her. She requests a refill for her lorazepam 0.5 mg tablets. She had been using Imodium and Lomotil but needs a refill for her Lomotil.   She continues to have dry skin.   MEDICAL HISTORY: Past Medical History  Diagnosis Date  . Hemorrhoid   . COPD (chronic obstructive pulmonary disease)   . Shortness of breath   . GERD (gastroesophageal reflux disease)   . Arthritis   . Cancer     lung, currently being treated with Oral med    ALLERGIES:  is allergic to doxycycline and amoxicillin.  MEDICATIONS:  Current Outpatient Prescriptions  Medication Sig Dispense Refill  . aspirin EC 81 MG tablet Take 81 mg by mouth daily.      . calcium carbonate (OS-CAL) 600 MG TABS Take 600 mg by mouth 2 (two) times daily with a meal.        . cholecalciferol (VITAMIN D) 1000 UNITS tablet Take 2,000 Units by mouth 2 (two) times daily.      . ciclopirox (PENLAC) 8 % solution       . co-enzyme Q-10 30 MG capsule Take 30 mg by mouth daily.      Marland Kitchen conjugated estrogens (PREMARIN) vaginal cream Place vaginally once a week.        . Cyanocobalamin (B-12) 1000 MCG CAPS Take 1,000 mcg by mouth 3 (three) times daily.       . diphenoxylate-atropine (LOMOTIL) 2.5-0.025 MG per tablet Take 1 tablet by mouth 4 (four) times daily as needed.       . diphenoxylate-atropine (LOMOTIL) 2.5-0.025 MG per tablet Take 1 tablet by mouth 4 (four) times daily as needed for diarrhea or loose stools.  30 tablet  0  . erlotinib (TARCEVA) 150  MG tablet Take 1 tablet (150 mg total) by mouth daily. Take on an empty stomach 1 hour before meals or 2 hours after.  30 tablet  2  . loperamide (IMODIUM A-D) 2 MG tablet Take 2 mg by mouth 4 (four) times daily as needed. diarrhea      . loratadine (CLARITIN) 10 MG tablet Take 10 mg by mouth daily.        Marland Kitchen LORazepam (ATIVAN) 0.5 MG tablet Take 1 tablet (0.5 mg total) by mouth every 8 (eight) hours as needed.  30 tablet  0  . Multiple Vitamin (MULTIVITAMIN) tablet Take 1 tablet by mouth daily.        . nabumetone (RELAFEN) 500 MG tablet Take 250 mg by mouth daily as needed. For pain      . omeprazole (PRILOSEC) 10 MG capsule Take 20  mg by mouth daily.       Bertram Gala Glycol-Propyl Glycol (SYSTANE OP) Place 1 drop into both eyes as needed. At night for dry eyes      . potassium chloride SA (K-DUR,KLOR-CON) 20 MEQ tablet Take 1 tablet (20 mEq total) by mouth 2 (two) times daily.  14 tablet  0  . sulfamethoxazole-trimethoprim (BACTRIM DS,SEPTRA DS) 800-160 MG per tablet Take 1 tablet by mouth 2 (two) times daily.  6 tablet  0  . tiotropium (SPIRIVA) 18 MCG inhalation capsule Place 18 mcg into inhaler and inhale daily.         No current facility-administered medications for this visit.    SURGICAL HISTORY:  Past Surgical History  Procedure Laterality Date  . Breast surgery  02/1999    left nipple discharge/ Central duct excision  . Anal fissure repair  07/09/2011  . Knee arthroscopy      R  . Abdominal hysterectomy    . Thumb arthroscopy      L - removed bone spur  . Tonsillectomy    . Appendectomy    . US echocardiography    . Thoracoscopy      w/lung biopsy  . Cardiovascular stress test      REVIEW OF SYSTEMS:  A comprehensive review of systems was negative except for: Gastrointestinal: positive for diarrhea   PHYSICAL EXAMINATION: General appearance: alert, cooperative and no distress Head: Normocephalic, without obvious abnormality, atraumatic Neck: no adenopathy Lymph nodes: Cervical, supraclavicular, and axillary nodes normal. Resp: clear to auscultation bilaterally Cardio: regular rate and rhythm, S1, S2 normal, no murmur, click, rub or gallop GI: soft, non-tender; bowel sounds normal; no masses,  no organomegaly Extremities: extremities normal, atraumatic, no cyanosis or edema Neurologic: Alert and oriented X 3, normal strength and tone. Normal symmetric reflexes. Normal coordination and gait Skin turgor is poor with significant tenting  ECOG PERFORMANCE STATUS: 0 - Asymptomatic  Blood pressure 90/62, pulse 91, temperature 97.5 F (36.4 C), temperature source Oral, resp. rate 20, height 5' 3.5"  (1.613 m), weight 119 lb 4.8 oz (54.114 kg).  LABORATORY DATA: Lab Results  Component Value Date   WBC 8.7 02/11/2013   HGB 14.1 02/11/2013   HCT 42.2 02/11/2013   MCV 93.2 02/11/2013   PLT 287 02/11/2013      Chemistry      Component Value Date/Time   NA 144 02/11/2013 1329   NA 140 01/06/2013 0920   NA 145 11/09/2011 1345   K 2.9 Repeated and Verified* 02/11/2013 1329   K 3.2* 01/06/2013 0920   K 3.9 11/09/2011 1345   CL 104 02/11/2013 1329  CL 102 01/06/2013 0920   CL 104 11/09/2011 1345   CO2 30* 02/11/2013 1329   CO2 27 01/06/2013 0920   CO2 28 11/09/2011 1345   BUN 22.3 02/11/2013 1329   BUN 16 01/06/2013 0920   BUN 20 11/09/2011 1345   CREATININE 0.9 02/11/2013 1329   CREATININE 0.92 01/06/2013 0920   CREATININE 0.6 11/09/2011 1345      Component Value Date/Time   CALCIUM 9.5 02/11/2013 1329   CALCIUM 9.5 01/06/2013 0920   CALCIUM 8.7 11/09/2011 1345   ALKPHOS 60 02/11/2013 1329   ALKPHOS 60 01/06/2013 0920   ALKPHOS 52 11/09/2011 1345   AST 19 02/11/2013 1329   AST 28 01/06/2013 0920   AST 20 11/09/2011 1345   ALT 20 02/11/2013 1329   ALT 20 01/06/2013 0920   BILITOT 0.94 02/11/2013 1329   BILITOT 1.2 01/06/2013 0920   BILITOT 0.60 11/09/2011 1345       RADIOGRAPHIC STUDIES: No results found.  ASSESSMENT/PLAN: This is a very pleasant 78 years old white female with stage IIIa non-small cell lung cancer currently on treatment with Tarceva status post approximately 46 months and tolerating it fairly well. Patient was discussed with Dr. Arbutus Ped. She has been having difficulty with diarrhea and the etiology is unclear. She does have a past history of C. difficile infection. The patient was advised to hold her Tarceva for 2 weeks and continue her Imodium and Lomotil. She was given a prescription for Lomotil tablets. She is to followup up with Dr. Dulce [name] as scheduled. She will followup here in 3 weeks with repeat CBC differential and C. met. She is somewhat dehydrated as evidenced by  her poor skin turgor. Her chemistries became available and she is also hypokalemic with a potassium of 2.9. She will be given a liter of normal saline with 20 mEq of potassium today. A prescription for potassium chloride 20 mEq, 1 tablet twice daily for the next 7 days to further address her hypokalemia was sent her pharmacy of record via [name]. Scribed.  Dana [name], Dana Belson E, PA-C   All questions were answered. The patient knows to call the clinic with any problems, questions or concerns. We can certainly see the patient much sooner if necessary.  I spent 20 minutes counseling the patient face to face. The total time spent in the appointment was 30 minutes.

## 2013-02-13 NOTE — Progress Notes (Signed)
Dana [name] was approved for assistance thru [employer] for $7,500.00 for assistance with her NSC-Lung-Cancer-MCR. Patient Reference Number: [id number]; Person Code 01; Rx BIN [id number]; Rx Group Number [id number]; PCN for Part D [id number].  The [facility] [employer]ucker is from 12/22/2012 to 12/21/2013.

## 2013-02-19 ENCOUNTER — Telehealth: Payer: Self-pay | Admitting: *Deleted

## 2013-02-19 NOTE — Telephone Encounter (Signed)
Pt called and left a message wanting to know when she can restart her tarceva.  She states she has not had any diarrhea since 2/19.  Per Tiana Loft, pt can restart tarceva 150mg  tomorrow 2/28.  Called and spoke to pt's husband, he verbalized understanding. SLJ

## 2013-03-02 ENCOUNTER — Encounter: Payer: Self-pay | Admitting: Physician Assistant

## 2013-03-02 ENCOUNTER — Ambulatory Visit (HOSPITAL_BASED_OUTPATIENT_CLINIC_OR_DEPARTMENT_OTHER): Payer: Medicare Other | Admitting: Physician Assistant

## 2013-03-02 ENCOUNTER — Telehealth: Payer: Self-pay | Admitting: Internal Medicine

## 2013-03-02 ENCOUNTER — Other Ambulatory Visit (HOSPITAL_BASED_OUTPATIENT_CLINIC_OR_DEPARTMENT_OTHER): Payer: Medicare Other | Admitting: Lab

## 2013-03-02 DIAGNOSIS — C3490 Malignant neoplasm of unspecified part of unspecified bronchus or lung: Secondary | ICD-10-CM

## 2013-03-02 DIAGNOSIS — R197 Diarrhea, unspecified: Secondary | ICD-10-CM

## 2013-03-02 DIAGNOSIS — C3430 Malignant neoplasm of lower lobe, unspecified bronchus or lung: Secondary | ICD-10-CM

## 2013-03-02 LAB — CBC WITH DIFFERENTIAL/PLATELET
Basophils Absolute: 0 10*3/uL (ref 0.0–0.1)
EOS%: 3 % (ref 0.0–7.0)
Eosinophils Absolute: 0.1 10*3/uL (ref 0.0–0.5)
HCT: 33.9 % — ABNORMAL LOW (ref 34.8–46.6)
HGB: 11.5 g/dL — ABNORMAL LOW (ref 11.6–15.9)
MONO#: 0.4 10*3/uL (ref 0.1–0.9)
NEUT#: 3.5 10*3/uL (ref 1.5–6.5)
RDW: 14.1 % (ref 11.2–14.5)
WBC: 4.7 10*3/uL (ref 3.9–10.3)
lymph#: 0.6 10*3/uL — ABNORMAL LOW (ref 0.9–3.3)

## 2013-03-02 LAB — COMPREHENSIVE METABOLIC PANEL (CC13)
AST: 21 U/L (ref 5–34)
Albumin: 3 g/dL — ABNORMAL LOW (ref 3.5–5.0)
BUN: 20.8 mg/dL (ref 7.0–26.0)
CO2: 27 mEq/L (ref 22–29)
Calcium: 9 mg/dL (ref 8.4–10.4)
Chloride: 106 mEq/L (ref 98–107)
Glucose: 89 mg/dl (ref 70–99)
Potassium: 3.6 mEq/L (ref 3.5–5.1)

## 2013-03-02 NOTE — Patient Instructions (Addendum)
Take your remaining Tarceva 150 mg tablets, one by mouth every other day until completed Your Tarceva dose is being decreased to 100 mg by mouth daily Followup with Dr. Arbutus Ped in one month for another for symptom management visit as well as with a restaging CT scan of your chest reevaluate your disease

## 2013-03-02 NOTE — Telephone Encounter (Signed)
gv and printed appt schedule for pt for April °

## 2013-03-05 ENCOUNTER — Encounter: Payer: Self-pay | Admitting: *Deleted

## 2013-03-05 NOTE — Progress Notes (Signed)
RECEIVED A FAX FROM [facility] CONCERNING A CONFIRMATION OF PRESCRIPTION SHIPMENT FOR TARCEVA ON 03/04/13.

## 2013-03-05 NOTE — Progress Notes (Signed)
[facility] Telephone:([phone]   Fax:([phone]  OFFICE PROGRESS NOTE  [name],[name], MD [address] [place] [address]  DIAGNOSIS: : Stage IIIA nonsmall cell lung cancer, adenocarcinoma, in a never smoker patient with positive EGFR mutation diagnosed in January 2010.   PRIOR THERAPY: Status post concurrent chemoradiation with weekly carboplatin and paclitaxel; last dose given March 21, 2009.   CURRENT THERAPY: Tarceva at 150 mg p.o. daily, status post approximately 47 months of treatment.  INTERVAL HISTORY: Dana [name] 78 y.o. female returns to the clinic today for a followup visit. She presents complaining of continued diarrhea, 5-7 episodes per day. She is been tested for C. differential twice in the road negative as were bacterial and parasite tests. Her Tarceva was held for 8 days to see if that would have any impact on her diarrhea. She did not have any significant diarrhea while she was off of the Tarceva but when she restarted the medication the diarrhea returned. She complains of some right eye irritation and has known "dry eye" and has been using Systane we will repeat wedding drops. She is scheduled to see Dr. Hazle Quant, an ophthalmologist, tomorrow to see if any of her eye symptomatology may be related to her Tarceva medication. She has managed to gain some weight since last month and is up approximately 5 pounds. She is otherwise tolerating her treatment with Tarceva relatively well. She reports that she has only 3 or 4 tablets remaining of her current prescription of Tarceva 150 mg tablets. She denied having any significant chest pain, shortness breath, cough or hemoptysis. She denied having any significant weight loss or night sweats. The patient denied having any significant skin rash. She is scheduled to follow up soon with Dr. Dulce Sellar regarding the diarrhea also. She continues to have dry skin.   MEDICAL HISTORY: Past Medical History   Diagnosis Date  . Hemorrhoid   . COPD (chronic obstructive pulmonary disease)   . Shortness of breath   . GERD (gastroesophageal reflux disease)   . Arthritis   . Cancer     lung, currently being treated with Oral med    ALLERGIES:  is allergic to doxycycline and amoxicillin.  MEDICATIONS:  Current Outpatient Prescriptions  Medication Sig Dispense Refill  . aspirin EC 81 MG tablet Take 81 mg by mouth daily.      . calcium carbonate (OS-CAL) 600 MG TABS Take 600 mg by mouth 2 (two) times daily with a meal.        . cholecalciferol (VITAMIN D) 1000 UNITS tablet Take 2,000 Units by mouth 2 (two) times daily.      . ciclopirox (PENLAC) 8 % solution       . co-enzyme Q-10 30 MG capsule Take 30 mg by mouth daily.      Marland Kitchen conjugated estrogens (PREMARIN) vaginal cream Place vaginally once a week.        . Cyanocobalamin (B-12) 1000 MCG CAPS Take 1,000 mcg by mouth 3 (three) times daily.       . diphenoxylate-atropine (LOMOTIL) 2.5-0.025 MG per tablet Take 1 tablet by mouth 4 (four) times daily as needed for diarrhea or loose stools.  30 tablet  0  . erlotinib (TARCEVA) 150 MG tablet Take 1 tablet (150 mg total) by mouth daily. Take on an empty stomach 1 hour before meals or 2 hours after.  30 tablet  2  . loperamide (IMODIUM A-D) 2 MG tablet Take 2 mg by  mouth 4 (four) times daily as needed. diarrhea      . loratadine (CLARITIN) 10 MG tablet Take 10 mg by mouth daily.        Marland Kitchen LORazepam (ATIVAN) 0.5 MG tablet Take 1 tablet (0.5 mg total) by mouth every 8 (eight) hours as needed.  30 tablet  0  . Multiple Vitamin (MULTIVITAMIN) tablet Take 1 tablet by mouth daily.        . nabumetone (RELAFEN) 500 MG tablet Take 250 mg by mouth daily as needed. For pain      . omeprazole (PRILOSEC) 10 MG capsule Take 20 mg by mouth daily.       Bertram Gala Glycol-Propyl Glycol (SYSTANE OP) Place 1 drop into both eyes as needed. At night for dry eyes      . tiotropium (SPIRIVA) 18 MCG inhalation capsule Place  18 mcg into inhaler and inhale daily.         No current facility-administered medications for this visit.    SURGICAL HISTORY:  Past Surgical History  Procedure Laterality Date  . Breast surgery  02/1999    left nipple discharge/ Central duct excision  . Anal fissure repair  07/09/2011  . Knee arthroscopy      R  . Abdominal hysterectomy    . Thumb arthroscopy      L - removed bone spur  . Tonsillectomy    . Appendectomy    . US echocardiography    . Thoracoscopy      w/lung biopsy  . Cardiovascular stress test      REVIEW OF SYSTEMS:  A comprehensive review of systems was negative except for: Gastrointestinal: positive for diarrhea   PHYSICAL EXAMINATION: General appearance: alert, cooperative and no distress Head: Normocephalic, without obvious abnormality, atraumatic Neck: no adenopathy Lymph nodes: Cervical, supraclavicular, and axillary nodes normal. Resp: clear to auscultation bilaterally Cardio: regular rate and rhythm, S1, S2 normal, no murmur, click, rub or gallop GI: soft, non-tender; bowel sounds normal; no masses,  no organomegaly Extremities: extremities normal, atraumatic, no cyanosis or edema Neurologic: Alert and oriented X 3, normal strength and tone. Normal symmetric reflexes. Normal coordination and gait   ECOG PERFORMANCE STATUS: 0 - Asymptomatic  Blood pressure 106/71, pulse 97, temperature 97.1 F (36.2 C), temperature source Oral, resp. rate 18, height 5' 3.5" (1.613 m), weight 124 lb 6.4 oz (56.427 kg).  LABORATORY DATA: Lab Results  Component Value Date   WBC 4.7 03/02/2013   HGB 11.5* 03/02/2013   HCT 33.9* 03/02/2013   MCV 93.9 03/02/2013   PLT 321 03/02/2013      Chemistry      Component Value Date/Time   NA 141 03/02/2013 1345   NA 140 01/06/2013 0920   NA 145 11/09/2011 1345   K 3.6 03/02/2013 1345   K 3.2* 01/06/2013 0920   K 3.9 11/09/2011 1345   CL 106 03/02/2013 1345   CL 102 01/06/2013 0920   CL 104 11/09/2011 1345   CO2 27  03/02/2013 1345   CO2 27 01/06/2013 0920   CO2 28 11/09/2011 1345   BUN 20.8 03/02/2013 1345   BUN 16 01/06/2013 0920   BUN 20 11/09/2011 1345   CREATININE 0.8 03/02/2013 1345   CREATININE 0.92 01/06/2013 0920   CREATININE 0.6 11/09/2011 1345      Component Value Date/Time   CALCIUM 9.0 03/02/2013 1345   CALCIUM 9.5 01/06/2013 0920   CALCIUM 8.7 11/09/2011 1345   ALKPHOS 51 03/02/2013 1345   ALKPHOS  60 01/06/2013 0920   ALKPHOS 52 11/09/2011 1345   AST 21 03/02/2013 1345   AST 28 01/06/2013 0920   AST 20 11/09/2011 1345   ALT 19 03/02/2013 1345   ALT 20 01/06/2013 0920   BILITOT 0.89 03/02/2013 1345   BILITOT 1.2 01/06/2013 0920   BILITOT 0.60 11/09/2011 1345       RADIOGRAPHIC STUDIES: No results found.  ASSESSMENT/PLAN: This is a very pleasant 78 years old white female with stage IIIa non-small cell lung cancer currently on treatment with Tarceva status post approximately 46 months and tolerating it fairly well. Patient was discussed with Dr. Arbutus Ped. She has been having difficulty with diarrhea and the etiology is unclear. She does have a past history of C. difficile infection, however she is been tested recently twice and has been negative both times as well as having negative bacterial and parasite studies. She is to followup up with Dr. Dulce Sellar as scheduled. She is to take her remaining Tarceva tablets one by mouth every other day until completed. We will reduce the dose of the Tarceva from 150 mg by mouth daily to 100 mg by mouth daily. She'll followup with Dr. Arbutus Ped in one month with a restaging CT scan of the chest to reevaluate her disease. Patient was in agreement with the plan.   Laural Benes, ADRENA E, PA-C   All questions were answered. The patient knows to call the clinic with any problems, questions or concerns. We can certainly see the patient much sooner if necessary.  I spent 20 minutes counseling the patient face to face. The total time spent in the appointment was 30  minutes.

## 2013-03-12 ENCOUNTER — Other Ambulatory Visit: Payer: Self-pay | Admitting: *Deleted

## 2013-03-12 MED ORDER — DIPHENOXYLATE-ATROPINE 2.5-0.025 MG PO TABS: 1.0000 | ORAL_TABLET | Freq: Four times a day (QID) | ORAL | Status: DC | PRN

## 2013-03-17 ENCOUNTER — Telehealth: Payer: Self-pay | Admitting: Medical Oncology

## 2013-03-17 NOTE — Telephone Encounter (Signed)
Pt reports onset of watery diarrhea for 11 days . She has approx 4-5 /day and is taking lomitil, drinking pedialyte, gatorade. On tarceva reduced dose for diarrhea . I instructed pt to increase fluids, start imodium . I will consult with Dr Arbutus Ped.

## 2013-03-17 NOTE — Telephone Encounter (Signed)
I called pt back and the imodium has worked some. She has not had a diarrhea stool . Per Dr Arbutus Ped can hold tarceva for 2 days . Pt informed and I told pt to call back Thursday mid afternoon  Or before if diarrhea worsens.

## 2013-03-19 NOTE — Telephone Encounter (Signed)
Pt called back and stated that she has not had any diarrhea since Wednesday 3/26 and today had a normal BM.  Per Dr Donnald Garre, needs to hold tarceva today and tomorrow and resume Saturday 3/28.  Pt verbalized understanding and will take imodium as needed.  SLJ

## 2013-03-27 ENCOUNTER — Encounter (HOSPITAL_COMMUNITY): Payer: Self-pay

## 2013-03-27 ENCOUNTER — Other Ambulatory Visit (HOSPITAL_BASED_OUTPATIENT_CLINIC_OR_DEPARTMENT_OTHER): Payer: Medicare Other | Admitting: Lab

## 2013-03-27 ENCOUNTER — Ambulatory Visit (HOSPITAL_COMMUNITY)
Admission: RE | Admit: 2013-03-27 | Discharge: 2013-03-27 | Disposition: A | Discharge status: Home / Self Care | Payer: Medicare Other | Source: Ambulatory Visit | Attending: Physician Assistant | Admitting: Physician Assistant

## 2013-03-27 DIAGNOSIS — Z9221 Personal history of antineoplastic chemotherapy: Secondary | ICD-10-CM | POA: Insufficient documentation

## 2013-03-27 DIAGNOSIS — J90 Pleural effusion, not elsewhere classified: Secondary | ICD-10-CM | POA: Insufficient documentation

## 2013-03-27 DIAGNOSIS — C3430 Malignant neoplasm of lower lobe, unspecified bronchus or lung: Secondary | ICD-10-CM

## 2013-03-27 DIAGNOSIS — Z923 Personal history of irradiation: Secondary | ICD-10-CM | POA: Insufficient documentation

## 2013-03-27 DIAGNOSIS — Z85118 Personal history of other malignant neoplasm of bronchus and lung: Secondary | ICD-10-CM | POA: Insufficient documentation

## 2013-03-27 DIAGNOSIS — I700 Atherosclerosis of aorta: Secondary | ICD-10-CM | POA: Insufficient documentation

## 2013-03-27 LAB — COMPREHENSIVE METABOLIC PANEL (CC13)
ALT: 16 U/L (ref 0–55)
AST: 19 U/L (ref 5–34)
Albumin: 3 g/dL — ABNORMAL LOW (ref 3.5–5.0)
BUN: 15.4 mg/dL (ref 7.0–26.0)
Calcium: 9.1 mg/dL (ref 8.4–10.4)
Chloride: 105 mEq/L (ref 98–107)
Potassium: 4 mEq/L (ref 3.5–5.1)

## 2013-03-27 LAB — CBC WITH DIFFERENTIAL/PLATELET
BASO%: 0.3 % (ref 0.0–2.0)
Basophils Absolute: 0 10*3/uL (ref 0.0–0.1)
EOS%: 4.8 % (ref 0.0–7.0)
HGB: 12.3 g/dL (ref 11.6–15.9)
MCH: 31.9 pg (ref 25.1–34.0)
RDW: 14.5 % (ref 11.2–14.5)
WBC: 7.6 10*3/uL (ref 3.9–10.3)
lymph#: 0.6 10*3/uL — ABNORMAL LOW (ref 0.9–3.3)

## 2013-03-27 MED ORDER — IOHEXOL 300 MG/ML  SOLN
80.0000 mL | Freq: Once | INTRAMUSCULAR | Status: AC | PRN
  Administered 2013-03-27: 80 mL via INTRAVENOUS

## 2013-03-30 ENCOUNTER — Encounter: Payer: Self-pay | Admitting: Internal Medicine

## 2013-03-30 ENCOUNTER — Ambulatory Visit (HOSPITAL_BASED_OUTPATIENT_CLINIC_OR_DEPARTMENT_OTHER): Payer: Medicare Other | Admitting: Internal Medicine

## 2013-03-30 ENCOUNTER — Telehealth: Payer: Self-pay | Admitting: Internal Medicine

## 2013-03-30 VITALS — BP 136/87 | HR 94 | Temp 97.6°F | Resp 20 | Ht 63.5 in | Wt 125.3 lb

## 2013-03-30 DIAGNOSIS — C3490 Malignant neoplasm of unspecified part of unspecified bronchus or lung: Secondary | ICD-10-CM

## 2013-03-30 DIAGNOSIS — C3410 Malignant neoplasm of upper lobe, unspecified bronchus or lung: Secondary | ICD-10-CM

## 2013-03-30 NOTE — Patient Instructions (Signed)
No evidence for disease progression on his recent scan.  Continue treatment with Tarceva 100 mg by mouth daily.

## 2013-03-30 NOTE — Telephone Encounter (Signed)
, °

## 2013-03-30 NOTE — Progress Notes (Signed)
[facility] Telephone:(336) [phone]   Fax:(336) [phone]  OFFICE PROGRESS NOTE  [name],[name], MD [address] [place] [address]  DIAGNOSIS: : Stage IIIA nonsmall cell lung cancer, adenocarcinoma, in a never smoker patient with positive EGFR mutation diagnosed in January 2010.   PRIOR THERAPY:  1) Status post concurrent chemoradiation with weekly carboplatin and paclitaxel; last dose given March 21, 2009.  2) Tarceva at 150 mg p.o. daily, status post approximately 48 months of treatment, discontinued secondary to persistent diarrhea.   CURRENT THERAPY: Tarceva at 100 mg p.o. Daily, started March 19 2013, status post 10 days of treatment.  INTERVAL HISTORY: Dana [name] 78 y.o. female returns to the clinic today for followup visit accompanied by her husband. The patient is feeling fine today with no specific complaints. Her diarrhea has improved significantly after the patient switching her Tarceva to 100 mg by mouth daily. She denied having any significant skin rash. She denied having any weight loss or night sweats. She has no chest pain, shortness breath, cough or hemoptysis. The patient had repeat CT scan of the chest, abdomen and pelvis performed recently and she is here for evaluation and discussion of her scan results.  MEDICAL HISTORY: Past Medical History  Diagnosis Date  . Hemorrhoid   . COPD (chronic obstructive pulmonary disease)   . Shortness of breath   . GERD (gastroesophageal reflux disease)   . Arthritis   . Cancer     lung, currently being treated with Oral med    ALLERGIES:  is allergic to doxycycline and amoxicillin.  MEDICATIONS:  Current Outpatient Prescriptions  Medication Sig Dispense Refill  . aspirin EC 81 MG tablet Take 81 mg by mouth daily.      . calcium carbonate (OS-CAL) 600 MG TABS Take 600 mg by mouth 2 (two) times daily with a meal.        . cholecalciferol (VITAMIN D) 1000 UNITS tablet Take 2,000 Units by mouth  2 (two) times daily.      . ciclopirox (PENLAC) 8 % solution       . co-enzyme Q-10 30 MG capsule Take 30 mg by mouth daily.      Marland Kitchen conjugated estrogens (PREMARIN) vaginal cream Place vaginally once a week.        . Cyanocobalamin (B-12) 1000 MCG CAPS Take 1,000 mcg by mouth 3 (three) times daily.       Marland Kitchen erlotinib (TARCEVA) 100 MG tablet Take 100 mg by mouth daily. Take on an empty stomach 1 hour before meals or 2 hours after      . loperamide (IMODIUM A-D) 2 MG tablet Take 2 mg by mouth 4 (four) times daily as needed. diarrhea      . loratadine (CLARITIN) 10 MG tablet Take 10 mg by mouth daily.        Marland Kitchen LORazepam (ATIVAN) 0.5 MG tablet Take 1 tablet (0.5 mg total) by mouth every 8 (eight) hours as needed.  30 tablet  0  . Multiple Vitamin (MULTIVITAMIN) tablet Take 1 tablet by mouth daily.        . nabumetone (RELAFEN) 500 MG tablet Take 250 mg by mouth daily as needed. For pain      . omeprazole (PRILOSEC) 10 MG capsule Take 20 mg by mouth daily.       Bertram Gala Glycol-Propyl Glycol (SYSTANE OP) Place 1 drop into both eyes as needed. At night for dry eyes      .  tiotropium (SPIRIVA) 18 MCG inhalation capsule Place 18 mcg into inhaler and inhale daily.         No current facility-administered medications for this visit.    SURGICAL HISTORY:  Past Surgical History  Procedure Laterality Date  . Breast surgery  02/1999    left nipple discharge/ Central duct excision  . Anal fissure repair  07/09/2011  . Knee arthroscopy      R  . Abdominal hysterectomy    . Thumb arthroscopy      L - removed bone spur  . Tonsillectomy    . Appendectomy    . US echocardiography    . Thoracoscopy      w/lung biopsy  . Cardiovascular stress test      REVIEW OF SYSTEMS:  A comprehensive review of systems was negative.   PHYSICAL EXAMINATION: General appearance: alert, cooperative and no distress Head: Normocephalic, without obvious abnormality, atraumatic Neck: no adenopathy Lymph nodes:  Cervical, supraclavicular, and axillary nodes normal. Resp: clear to auscultation bilaterally Cardio: regular rate and rhythm, S1, S2 normal, no murmur, click, rub or gallop GI: soft, non-tender; bowel sounds normal; no masses,  no organomegaly Extremities: extremities normal, atraumatic, no cyanosis or edema Neurologic: Alert and oriented X 3, normal strength and tone. Normal symmetric reflexes. Normal coordination and gait  ECOG PERFORMANCE STATUS: 0 - Asymptomatic  Blood pressure 136/87, pulse 94, temperature 97.6 F (36.4 C), temperature source Oral, resp. rate 20, height 5' 3.5" (1.613 m), weight 125 lb 4.8 oz (56.836 kg).  LABORATORY DATA: Lab Results  Component Value Date   WBC 7.6 03/27/2013   HGB 12.3 03/27/2013   HCT 36.4 03/27/2013   MCV 94.4 03/27/2013   PLT 255 03/27/2013      Chemistry      Component Value Date/Time   NA 142 03/27/2013 1015   NA 140 01/06/2013 0920   NA 145 11/09/2011 1345   K 4.0 03/27/2013 1015   K 3.2* 01/06/2013 0920   K 3.9 11/09/2011 1345   CL 105 03/27/2013 1015   CL 102 01/06/2013 0920   CL 104 11/09/2011 1345   CO2 27 03/27/2013 1015   CO2 27 01/06/2013 0920   CO2 28 11/09/2011 1345   BUN 15.4 03/27/2013 1015   BUN 16 01/06/2013 0920   BUN 20 11/09/2011 1345   CREATININE 0.8 03/27/2013 1015   CREATININE 0.92 01/06/2013 0920   CREATININE 0.6 11/09/2011 1345      Component Value Date/Time   CALCIUM 9.1 03/27/2013 1015   CALCIUM 9.5 01/06/2013 0920   CALCIUM 8.7 11/09/2011 1345   ALKPHOS 51 03/27/2013 1015   ALKPHOS 60 01/06/2013 0920   ALKPHOS 52 11/09/2011 1345   AST 19 03/27/2013 1015   AST 28 01/06/2013 0920   AST 20 11/09/2011 1345   ALT 16 03/27/2013 1015   ALT 20 01/06/2013 0920   BILITOT 1.55* 03/27/2013 1015   BILITOT 1.2 01/06/2013 0920   BILITOT 0.60 11/09/2011 1345       RADIOGRAPHIC STUDIES: Ct Chest W Contrast  03/27/2013  *RADIOLOGY REPORT*  Clinical Data: History of lung cancer diagnosed in 2010 status post chemotherapy and radiation therapy  complete. Scan  CT CHEST WITH CONTRAST  Technique:  Multidetector CT imaging of the chest was performed following the standard protocol during bolus administration of intravenous contrast.  Contrast: 80mL OMNIPAQUE IOHEXOL 300 MG/ML  SOLN  Comparison: Multiple priors, most recently chest CT 12/26/2012.  Findings:  Mediastinum: Heart size is normal. Small amount of  anterior pericardial fluid and/or thickening, similar to prior studies, and unlikely to be of any hemodynamic significance.  No associated pericardial calcification. No pathologically enlarged mediastinal or hilar lymph nodes. Esophagus is unremarkable in appearance. Mild atherosclerosis of the thoracic aorta and great vessels of the mediastinum.  Lungs/Pleura: Post procedural changes of radiation therapy are noted with chronic volume loss and architectural distortion in the perihilar aspect of the right lung involving predominately the right middle and right lower lobes.  No definite soft tissue mass is noted in this region to suggest local recurrence of disease. There are no suspicious appearing pulmonary nodules or masses identified within the lungs.  Large large right-sided pleural effusion is similar to the prior examination.  No definite enhancing pleural nodularity is identified at this time.  Upper Abdomen: Atherosclerosis.  Musculoskeletal: There are no aggressive appearing lytic or blastic lesions noted in the visualized portions of the skeleton.  IMPRESSION: 1.  Stable post procedural changes of radiation therapy in the right lung, with large chronic right-sided pleural effusion.  No definitive findings to suggest local recurrence of disease or new metastatic disease within the thorax on today's study. 2.  Mild atherosclerosis.   Original Report Authenticated By: Trudie Reed, M.D.     ASSESSMENT: this is a very pleasant 78 years old white female with history of stage IIIa non-small cell lung cancer currently on treatment with Tarceva 100  mg by mouth daily and tolerating it fairly well. The patient has no evidence for disease progression on his recent scan.   PLAN: I discussed the scan results with the patient and her husband. I recommended for her to continue on observation with follow up visit in one month. She was advised to call me immediately she has any concerning symptoms in the interval. .  All questions were answered. The patient knows to call the clinic with any problems, questions or concerns. We can certainly see the patient much sooner if necessary.  I spent 15 minutes counseling the patient face to face. The total time spent in the appointment was 25 minutes.

## 2013-04-01 ENCOUNTER — Other Ambulatory Visit: Payer: Self-pay | Admitting: *Deleted

## 2013-04-01 DIAGNOSIS — C3490 Malignant neoplasm of unspecified part of unspecified bronchus or lung: Secondary | ICD-10-CM

## 2013-04-01 MED ORDER — ERLOTINIB HCL 100 MG PO TABS: 100.0000 mg | ORAL_TABLET | Freq: Every day | ORAL | Status: DC

## 2013-04-02 ENCOUNTER — Other Ambulatory Visit: Payer: Self-pay

## 2013-04-02 DIAGNOSIS — Z1231 Encounter for screening mammogram for malignant neoplasm of breast: Secondary | ICD-10-CM

## 2013-04-07 NOTE — Telephone Encounter (Signed)
RECEIVED A FAX FROM [employer] CONCERNING A CONFIRMATION OF PRESCRIPTION SHIPMENT FOR TARCEVA ON 04/06/13.

## 2013-04-29 ENCOUNTER — Encounter: Payer: Self-pay | Admitting: Internal Medicine

## 2013-04-29 ENCOUNTER — Other Ambulatory Visit (HOSPITAL_BASED_OUTPATIENT_CLINIC_OR_DEPARTMENT_OTHER): Payer: Medicare Other | Admitting: Lab

## 2013-04-29 ENCOUNTER — Telehealth: Payer: Self-pay | Admitting: Internal Medicine

## 2013-04-29 ENCOUNTER — Ambulatory Visit (HOSPITAL_BASED_OUTPATIENT_CLINIC_OR_DEPARTMENT_OTHER): Payer: Medicare Other | Admitting: Internal Medicine

## 2013-04-29 VITALS — BP 112/65 | HR 93 | Temp 98.2°F | Resp 17 | Ht 63.0 in | Wt 123.8 lb

## 2013-04-29 DIAGNOSIS — C3490 Malignant neoplasm of unspecified part of unspecified bronchus or lung: Secondary | ICD-10-CM

## 2013-04-29 LAB — COMPREHENSIVE METABOLIC PANEL (CC13)
ALT: 18 U/L (ref 0–55)
AST: 21 U/L (ref 5–34)
Albumin: 3.2 g/dL — ABNORMAL LOW (ref 3.5–5.0)
Calcium: 9 mg/dL (ref 8.4–10.4)
Chloride: 108 mEq/L — ABNORMAL HIGH (ref 98–107)
Potassium: 4.1 mEq/L (ref 3.5–5.1)

## 2013-04-29 LAB — CBC WITH DIFFERENTIAL/PLATELET
BASO%: 0.2 % (ref 0.0–2.0)
Basophils Absolute: 0 10*3/uL (ref 0.0–0.1)
EOS%: 3.7 % (ref 0.0–7.0)
HGB: 12.6 g/dL (ref 11.6–15.9)
MCH: 31.9 pg (ref 25.1–34.0)
RDW: 14.1 % (ref 11.2–14.5)
WBC: 6.2 10*3/uL (ref 3.9–10.3)
lymph#: 0.7 10*3/uL — ABNORMAL LOW (ref 0.9–3.3)

## 2013-04-29 NOTE — Telephone Encounter (Signed)
gv and printed appt sched and avs for pt for June... °

## 2013-04-29 NOTE — Patient Instructions (Signed)
Follow up visit in one month.  

## 2013-04-29 NOTE — Progress Notes (Signed)
[facility] Telephone:([phone]) [phone]   Fax:(336) [phone]  OFFICE PROGRESS NOTE  [name],[name], MD [address] [place] [address]  DIAGNOSIS: : Stage IIIA nonsmall cell lung cancer, adenocarcinoma, in a never smoker patient with positive EGFR mutation diagnosed in January 2010.   PRIOR THERAPY:  1) Status post concurrent chemoradiation with weekly carboplatin and paclitaxel; last dose given March 21, 2009.  2) Tarceva at 150 mg p.o. daily, status post approximately 48 months of treatment, discontinued secondary to persistent diarrhea.   CURRENT THERAPY: Tarceva at 100 mg p.o. Daily, started March 19 2013, status post 10 days of treatment.  INTERVAL HISTORY: Dana [name] 78 y.o. female returns to the clinic today for followup visit. The patient is feeling fine today with no specific complaints except for occasional diarrhea every now and then. She continues to have mild skin rash. She denied having any significant weight loss or night sweats. She has no chest pain, shortness of breath, cough or hemoptysis.  MEDICAL HISTORY: Past Medical History  Diagnosis Date  . Hemorrhoid   . COPD (chronic obstructive pulmonary disease)   . Shortness of breath   . GERD (gastroesophageal reflux disease)   . Arthritis   . Cancer     lung, currently being treated with Oral med    ALLERGIES:  is allergic to doxycycline and amoxicillin.  MEDICATIONS:  Current Outpatient Prescriptions  Medication Sig Dispense Refill  . aspirin EC 81 MG tablet Take 81 mg by mouth daily.      . calcium carbonate (OS-CAL) 600 MG TABS Take 600 mg by mouth 2 (two) times daily with a meal.        . cholecalciferol (VITAMIN D) 1000 UNITS tablet Take 2,000 Units by mouth 2 (two) times daily.      . ciclopirox (PENLAC) 8 % solution       . co-enzyme Q-10 30 MG capsule Take 30 mg by mouth daily.      Marland Kitchen conjugated estrogens (PREMARIN) vaginal cream Place vaginally once a week.        .  Cyanocobalamin (B-12) 1000 MCG CAPS Take 1,000 mcg by mouth 3 (three) times daily.       Marland Kitchen erlotinib (TARCEVA) 100 MG tablet Take 1 tablet (100 mg total) by mouth daily. Take on an empty stomach 1 hour before meals or 2 hours after  30 tablet  2  . loperamide (IMODIUM A-D) 2 MG tablet Take 2 mg by mouth 4 (four) times daily as needed. diarrhea      . loratadine (CLARITIN) 10 MG tablet Take 10 mg by mouth daily.        Marland Kitchen LORazepam (ATIVAN) 0.5 MG tablet Take 1 tablet (0.5 mg total) by mouth every 8 (eight) hours as needed.  30 tablet  0  . Multiple Vitamin (MULTIVITAMIN) tablet Take 1 tablet by mouth daily.        . nabumetone (RELAFEN) 500 MG tablet Take 250 mg by mouth daily as needed. For pain      . omeprazole (PRILOSEC) 10 MG capsule Take 20 mg by mouth daily.       Bertram Gala Glycol-Propyl Glycol (SYSTANE OP) Place 1 drop into both eyes as needed. At night for dry eyes      . tiotropium (SPIRIVA) 18 MCG inhalation capsule Place 18 mcg into inhaler and inhale daily.         No current facility-administered medications for this visit.  SURGICAL HISTORY:  Past Surgical History  Procedure Laterality Date  . Breast surgery  02/1999    left nipple discharge/ Central duct excision  . Anal fissure repair  07/09/2011  . Knee arthroscopy      R  . Abdominal hysterectomy    . Thumb arthroscopy      L - removed bone spur  . Tonsillectomy    . Appendectomy    . US echocardiography    . Thoracoscopy      w/lung biopsy  . Cardiovascular stress test      REVIEW OF SYSTEMS:  A comprehensive review of systems was negative except for: Gastrointestinal: positive for diarrhea   PHYSICAL EXAMINATION: General appearance: alert, cooperative and no distress Head: Normocephalic, without obvious abnormality, atraumatic Neck: no adenopathy Lymph nodes: Cervical, supraclavicular, and axillary nodes normal. Resp: clear to auscultation bilaterally Cardio: regular rate and rhythm, S1, S2 normal, no  murmur, click, rub or gallop GI: soft, non-tender; bowel sounds normal; no masses,  no organomegaly Extremities: extremities normal, atraumatic, no cyanosis or edema  ECOG PERFORMANCE STATUS: 1 - Symptomatic but completely ambulatory  Blood pressure 112/65, pulse 93, temperature 98.2 F (36.8 C), temperature source Oral, resp. rate 17, height 5\' 3"  (1.6 m), weight 123 lb 12.8 oz (56.155 kg).  LABORATORY DATA: Lab Results  Component Value Date   WBC 6.2 04/29/2013   HGB 12.6 04/29/2013   HCT 37.3 04/29/2013   MCV 94.2 04/29/2013   PLT 260 04/29/2013      Chemistry      Component Value Date/Time   NA 142 03/27/2013 1015   NA 140 01/06/2013 0920   NA 145 11/09/2011 1345   K 4.0 03/27/2013 1015   K 3.2* 01/06/2013 0920   K 3.9 11/09/2011 1345   CL 105 03/27/2013 1015   CL 102 01/06/2013 0920   CL 104 11/09/2011 1345   CO2 27 03/27/2013 1015   CO2 27 01/06/2013 0920   CO2 28 11/09/2011 1345   BUN 15.4 03/27/2013 1015   BUN 16 01/06/2013 0920   BUN 20 11/09/2011 1345   CREATININE 0.8 03/27/2013 1015   CREATININE 0.92 01/06/2013 0920   CREATININE 0.6 11/09/2011 1345      Component Value Date/Time   CALCIUM 9.1 03/27/2013 1015   CALCIUM 9.5 01/06/2013 0920   CALCIUM 8.7 11/09/2011 1345   ALKPHOS 51 03/27/2013 1015   ALKPHOS 60 01/06/2013 0920   ALKPHOS 52 11/09/2011 1345   AST 19 03/27/2013 1015   AST 28 01/06/2013 0920   AST 20 11/09/2011 1345   ALT 16 03/27/2013 1015   ALT 20 01/06/2013 0920   BILITOT 1.55* 03/27/2013 1015   BILITOT 1.2 01/06/2013 0920   BILITOT 0.60 11/09/2011 1345       RADIOGRAPHIC STUDIES: No results found.  ASSESSMENT: This is a very pleasant 78 years old white female with history of stage IIIa non-small cell lung cancer with positive EGFR mutation currently on treatment with Tarceva 100 milligrams by mouth daily and tolerating it fairly well except for mild diarrhea controlled with Imodium.  PLAN: I recommended for the patient to continue treatment with Tarceva at the current  dose. I would see her back for followup visit in one month with repeat CBC and comprehensive metabolic panel.  All questions were answered. The patient knows to call the clinic with any problems, questions or concerns. We can certainly see the patient much sooner if necessary.

## 2013-05-01 ENCOUNTER — Encounter: Payer: Self-pay | Admitting: *Deleted

## 2013-05-01 ENCOUNTER — Ambulatory Visit
Admission: RE | Admit: 2013-05-01 | Discharge: 2013-05-01 | Disposition: A | Discharge status: Home / Self Care | Payer: Medicare Other | Source: Ambulatory Visit

## 2013-05-01 DIAGNOSIS — Z1231 Encounter for screening mammogram for malignant neoplasm of breast: Secondary | ICD-10-CM

## 2013-05-01 NOTE — Progress Notes (Signed)
RECEIVED A FAX FROM [facility] CONCERNING A CONFIRMATION OF PRESCRIPTION SHIPMENT FOR TARCEVA ON 04/30/13.

## 2013-05-29 ENCOUNTER — Other Ambulatory Visit: Payer: Self-pay | Admitting: *Deleted

## 2013-05-29 NOTE — Telephone Encounter (Signed)
THIS REFILL REQUEST FOR TARCEVA WAS GIVEN TO DR.MOHAMED'S NURSE, DIANE BELL,RN. 

## 2013-06-03 ENCOUNTER — Ambulatory Visit (HOSPITAL_BASED_OUTPATIENT_CLINIC_OR_DEPARTMENT_OTHER): Payer: Medicare Other | Admitting: Internal Medicine

## 2013-06-03 ENCOUNTER — Encounter: Payer: Self-pay | Admitting: Internal Medicine

## 2013-06-03 ENCOUNTER — Other Ambulatory Visit (HOSPITAL_BASED_OUTPATIENT_CLINIC_OR_DEPARTMENT_OTHER): Payer: Medicare Other | Admitting: Lab

## 2013-06-03 ENCOUNTER — Telehealth: Payer: Self-pay | Admitting: Internal Medicine

## 2013-06-03 VITALS — BP 117/67 | HR 92 | Temp 97.3°F | Resp 18 | Ht 63.0 in | Wt 123.6 lb

## 2013-06-03 DIAGNOSIS — C3490 Malignant neoplasm of unspecified part of unspecified bronchus or lung: Secondary | ICD-10-CM

## 2013-06-03 DIAGNOSIS — C342 Malignant neoplasm of middle lobe, bronchus or lung: Secondary | ICD-10-CM

## 2013-06-03 LAB — CBC WITH DIFFERENTIAL/PLATELET
BASO%: 0.4 % (ref 0.0–2.0)
EOS%: 7.7 % — ABNORMAL HIGH (ref 0.0–7.0)
MCH: 32.1 pg (ref 25.1–34.0)
MCHC: 34.8 g/dL (ref 31.5–36.0)
RDW: 13.6 % (ref 11.2–14.5)
lymph#: 0.6 10*3/uL — ABNORMAL LOW (ref 0.9–3.3)

## 2013-06-03 LAB — COMPREHENSIVE METABOLIC PANEL (CC13)
AST: 24 U/L (ref 5–34)
BUN: 20.8 mg/dL (ref 7.0–26.0)
Calcium: 8.8 mg/dL (ref 8.4–10.4)
Chloride: 108 mEq/L — ABNORMAL HIGH (ref 98–107)
Creatinine: 0.8 mg/dL (ref 0.6–1.1)
Total Bilirubin: 0.65 mg/dL (ref 0.20–1.20)

## 2013-06-03 NOTE — Telephone Encounter (Signed)
Fax from [facility] that Tarceva was shipped on 06/02/13 for next day delivery.

## 2013-06-03 NOTE — Patient Instructions (Signed)
Continue Tarceva 100 mg by mouth daily. Followup visit in one month with repeat CT scan of the chest

## 2013-06-03 NOTE — Progress Notes (Signed)
[facility] [phone]   [phone]  OFFICE PROGRESS NOTE  [name],[name], MD [address] [place] [address]  DIAGNOSIS: : Stage IIIA nonsmall cell lung cancer, adenocarcinoma, in a never smoker patient with positive EGFR mutation diagnosed in January 2010.   PRIOR THERAPY:  1) Status post concurrent chemoradiation with weekly carboplatin and paclitaxel; last dose given March 21, 2009.  2) Tarceva at 150 mg p.o. daily, status post approximately 48 months of treatment, discontinued secondary to persistent diarrhea.   CURRENT THERAPY: Tarceva at 100 mg p.o. Daily, started March 19 2013, status post 10 weeks of treatment.  INTERVAL HISTORY: LINDSY [name] 78 y.o. female returns to the clinic today for followup visit. The patient is feeling fine today with no specific complaints. She is tolerating the lower dose of Tarceva 100 mg daily fairly well with no significant adverse effects. She denied having any skin rash or diarrhea. The patient denied having any chest pain, shortness breath, cough or hemoptysis. She has no nausea or vomiting.   MEDICAL HISTORY: Past Medical History  Diagnosis Date  . Hemorrhoid   . COPD (chronic obstructive pulmonary disease)   . Shortness of breath   . GERD (gastroesophageal reflux disease)   . Arthritis   . Cancer     lung, currently being treated with Oral med    ALLERGIES:  is allergic to doxycycline and amoxicillin.  MEDICATIONS:  Current Outpatient Prescriptions  Medication Sig Dispense Refill  . aspirin EC 81 MG tablet Take 81 mg by mouth daily.      . calcium carbonate (OS-CAL) 600 MG TABS Take 600 mg by mouth 2 (two) times daily with a meal.        . cholecalciferol (VITAMIN D) 1000 UNITS tablet Take 2,000 Units by mouth 2 (two) times daily.      . ciclopirox (PENLAC) 8 % solution       . co-enzyme Q-10 30 MG capsule Take 30 mg by mouth daily.      Marland Kitchen conjugated estrogens (PREMARIN) vaginal  cream Place vaginally once a week.        . Cyanocobalamin (B-12) 1000 MCG CAPS Take 1,000 mcg by mouth 3 (three) times daily.       Marland Kitchen erlotinib (TARCEVA) 100 MG tablet Take 1 tablet (100 mg total) by mouth daily. Take on an empty stomach 1 hour before meals or 2 hours after  30 tablet  2  . loperamide (IMODIUM A-D) 2 MG tablet Take 2 mg by mouth 4 (four) times daily as needed. diarrhea      . loratadine (CLARITIN) 10 MG tablet Take 10 mg by mouth daily.        . Multiple Vitamin (MULTIVITAMIN) tablet Take 1 tablet by mouth daily.        . nabumetone (RELAFEN) 500 MG tablet Take 250 mg by mouth daily as needed. For pain      . omeprazole (PRILOSEC) 10 MG capsule Take 20 mg by mouth daily.       Bertram Gala Glycol-Propyl Glycol (SYSTANE OP) Place 1 drop into both eyes as needed. At night for dry eyes      . tiotropium (SPIRIVA) 18 MCG inhalation capsule Place 18 mcg into inhaler and inhale daily.        Marland Kitchen LORazepam (ATIVAN) 0.5 MG tablet Take 1 tablet (0.5 mg total) by mouth every 8 (eight) hours as needed.  30 tablet  0   No  current facility-administered medications for this visit.    SURGICAL HISTORY:  Past Surgical History  Procedure Laterality Date  . Breast surgery  02/1999    left nipple discharge/ Central duct excision  . Anal fissure repair  07/09/2011  . Knee arthroscopy      R  . Abdominal hysterectomy    . Thumb arthroscopy      L - removed bone spur  . Tonsillectomy    . Appendectomy    . US echocardiography    . Thoracoscopy      w/lung biopsy  . Cardiovascular stress test      REVIEW OF SYSTEMS:  A comprehensive review of systems was negative.   PHYSICAL EXAMINATION: General appearance: alert, cooperative and no distress Head: Normocephalic, without obvious abnormality, atraumatic Neck: no adenopathy Lymph nodes: Cervical, supraclavicular, and axillary nodes normal. Resp: clear to auscultation bilaterally Cardio: regular rate and rhythm, S1, S2 normal, no murmur,  click, rub or gallop GI: soft, non-tender; bowel sounds normal; no masses,  no organomegaly Extremities: extremities normal, atraumatic, no cyanosis or edema  ECOG PERFORMANCE STATUS: 0 - Asymptomatic  Blood pressure 117/67, pulse 92, temperature 97.3 F (36.3 C), temperature source Oral, resp. rate 18, height 5\' 3"  (1.6 m), weight 123 lb 9.6 oz (56.065 kg).  LABORATORY DATA: Lab Results  Component Value Date   WBC 6.1 06/03/2013   HGB 12.6 06/03/2013   HCT 36.3 06/03/2013   MCV 92.3 06/03/2013   PLT 242 06/03/2013      Chemistry      Component Value Date/Time   NA 141 06/03/2013 0939   NA 140 01/06/2013 0920   NA 145 11/09/2011 1345   K 3.8 06/03/2013 0939   K 3.2* 01/06/2013 0920   K 3.9 11/09/2011 1345   CL 108* 06/03/2013 0939   CL 102 01/06/2013 0920   CL 104 11/09/2011 1345   CO2 25 06/03/2013 0939   CO2 27 01/06/2013 0920   CO2 28 11/09/2011 1345   BUN 20.8 06/03/2013 0939   BUN 16 01/06/2013 0920   BUN 20 11/09/2011 1345   CREATININE 0.8 06/03/2013 0939   CREATININE 0.92 01/06/2013 0920   CREATININE 0.6 11/09/2011 1345      Component Value Date/Time   CALCIUM 8.8 06/03/2013 0939   CALCIUM 9.5 01/06/2013 0920   CALCIUM 8.7 11/09/2011 1345   ALKPHOS 53 06/03/2013 0939   ALKPHOS 60 01/06/2013 0920   ALKPHOS 52 11/09/2011 1345   AST 24 06/03/2013 0939   AST 28 01/06/2013 0920   AST 20 11/09/2011 1345   ALT 19 06/03/2013 0939   ALT 20 01/06/2013 0920   BILITOT 0.65 06/03/2013 0939   BILITOT 1.2 01/06/2013 0920   BILITOT 0.60 11/09/2011 1345       RADIOGRAPHIC STUDIES: No results found.  ASSESSMENT: This is a very pleasant 78 years old white female with history of stage IIIa non-small cell lung cancer status post concurrent chemoradiation and currently on treatment with Tarceva 100 mg by mouth daily started 2 weeks ago and she is tolerating it fairly well.   PLAN: I recommended for the patient to continue treatment with Tarceva with the same dose. I would see her back for  followup visit in one month with repeat CT scan of the chest for restaging of her disease. She was advised to call immediately if she has any concerning symptoms in the interval.  All questions were answered. The patient knows to call the clinic with any problems, questions or  concerns. We can certainly see the patient much sooner if necessary.

## 2013-06-03 NOTE — Telephone Encounter (Signed)
gv adn printed appt sched and avs for pt....pt aware cs will contact with d/t for ct.Marland KitchenMarland KitchenMarland Kitchen

## 2013-07-01 ENCOUNTER — Encounter (HOSPITAL_COMMUNITY): Payer: Self-pay

## 2013-07-01 ENCOUNTER — Ambulatory Visit (HOSPITAL_COMMUNITY)
Admission: RE | Admit: 2013-07-01 | Discharge: 2013-07-01 | Disposition: A | Discharge status: Home / Self Care | Payer: Medicare Other | Source: Ambulatory Visit | Attending: Internal Medicine | Admitting: Internal Medicine

## 2013-07-01 ENCOUNTER — Other Ambulatory Visit: Payer: Medicare Other | Admitting: Lab

## 2013-07-01 ENCOUNTER — Other Ambulatory Visit (HOSPITAL_BASED_OUTPATIENT_CLINIC_OR_DEPARTMENT_OTHER): Payer: Medicare Other | Admitting: Lab

## 2013-07-01 DIAGNOSIS — Z923 Personal history of irradiation: Secondary | ICD-10-CM | POA: Insufficient documentation

## 2013-07-01 DIAGNOSIS — J90 Pleural effusion, not elsewhere classified: Secondary | ICD-10-CM | POA: Insufficient documentation

## 2013-07-01 DIAGNOSIS — C3490 Malignant neoplasm of unspecified part of unspecified bronchus or lung: Secondary | ICD-10-CM | POA: Insufficient documentation

## 2013-07-01 DIAGNOSIS — Z79899 Other long term (current) drug therapy: Secondary | ICD-10-CM | POA: Insufficient documentation

## 2013-07-01 DIAGNOSIS — I2510 Atherosclerotic heart disease of native coronary artery without angina pectoris: Secondary | ICD-10-CM | POA: Insufficient documentation

## 2013-07-01 DIAGNOSIS — C342 Malignant neoplasm of middle lobe, bronchus or lung: Secondary | ICD-10-CM

## 2013-07-01 LAB — CBC WITH DIFFERENTIAL/PLATELET
Basophils Absolute: 0 10*3/uL (ref 0.0–0.1)
HCT: 37.9 % (ref 34.8–46.6)
HGB: 12.9 g/dL (ref 11.6–15.9)
LYMPH%: 11.3 % — ABNORMAL LOW (ref 14.0–49.7)
MONO#: 0.6 10*3/uL (ref 0.1–0.9)
NEUT%: 75 % (ref 38.4–76.8)
Platelets: 268 10*3/uL (ref 145–400)
WBC: 6.5 10*3/uL (ref 3.9–10.3)
lymph#: 0.7 10*3/uL — ABNORMAL LOW (ref 0.9–3.3)

## 2013-07-01 LAB — COMPREHENSIVE METABOLIC PANEL (CC13)
BUN: 28.3 mg/dL — ABNORMAL HIGH (ref 7.0–26.0)
CO2: 29 mEq/L (ref 22–29)
Calcium: 9.4 mg/dL (ref 8.4–10.4)
Chloride: 106 mEq/L (ref 98–109)
Creatinine: 0.8 mg/dL (ref 0.6–1.1)
Glucose: 93 mg/dl (ref 70–140)

## 2013-07-01 MED ORDER — IOHEXOL 300 MG/ML  SOLN
80.0000 mL | Freq: Once | INTRAMUSCULAR | Status: AC | PRN
  Administered 2013-07-01: 80 mL via INTRAVENOUS

## 2013-07-02 ENCOUNTER — Ambulatory Visit (HOSPITAL_BASED_OUTPATIENT_CLINIC_OR_DEPARTMENT_OTHER): Payer: Medicare Other | Admitting: Internal Medicine

## 2013-07-02 ENCOUNTER — Encounter: Payer: Self-pay | Admitting: Internal Medicine

## 2013-07-02 ENCOUNTER — Telehealth: Payer: Self-pay | Admitting: Internal Medicine

## 2013-07-02 VITALS — BP 115/67 | HR 90 | Temp 98.1°F | Resp 18 | Ht 63.0 in | Wt 124.5 lb

## 2013-07-02 DIAGNOSIS — C3430 Malignant neoplasm of lower lobe, unspecified bronchus or lung: Secondary | ICD-10-CM

## 2013-07-02 DIAGNOSIS — C3490 Malignant neoplasm of unspecified part of unspecified bronchus or lung: Secondary | ICD-10-CM

## 2013-07-02 NOTE — Telephone Encounter (Signed)
gv pt appt schedule for August.  °

## 2013-07-02 NOTE — Progress Notes (Signed)
[facility] Telephone:([phone]   Fax:(336) [phone]  OFFICE PROGRESS NOTE  [name],[name], MD [address] [place] [address]  DIAGNOSIS: : Stage IIIA nonsmall cell lung cancer, adenocarcinoma, in a never smoker patient with positive EGFR mutation diagnosed in January 2010.   PRIOR THERAPY:  1) Status post concurrent chemoradiation with weekly carboplatin and paclitaxel; last dose given March 21, 2009.  2) Tarceva at 150 mg p.o. daily, status post approximately 48 months of treatment, discontinued secondary to persistent diarrhea.   CURRENT THERAPY: Tarceva at 100 mg p.o. Daily, started March 19 2013, status post 2 months of treatment.   INTERVAL HISTORY: Dana [name] 78 y.o. female returns to the clinic today for followup visit. The patient is feeling fine today with no specific complaints. She denied having any significant skin rash or diarrhea. She denied having any nausea or vomiting. She has no fever or chills. The patient denied having any significant chest pain, shortness breath, cough or hemoptysis. She had repeat CT scan of the chest performed recently and she is here for evaluation and discussion of her scan results.  MEDICAL HISTORY: Past Medical History  Diagnosis Date  . Hemorrhoid   . COPD (chronic obstructive pulmonary disease)   . Shortness of breath   . GERD (gastroesophageal reflux disease)   . Arthritis   . Cancer     lung, currently being treated with Oral med    ALLERGIES:  is allergic to doxycycline and amoxicillin.  MEDICATIONS:  Current Outpatient Prescriptions  Medication Sig Dispense Refill  . aspirin EC 81 MG tablet Take 81 mg by mouth daily.      . calcium carbonate (OS-CAL) 600 MG TABS Take 600 mg by mouth 2 (two) times daily with a meal.        . cholecalciferol (VITAMIN D) 1000 UNITS tablet Take 2,000 Units by mouth 2 (two) times daily.      . ciclopirox (PENLAC) 8 % solution       . co-enzyme Q-10 30 MG  capsule Take 30 mg by mouth daily.      Marland Kitchen conjugated estrogens (PREMARIN) vaginal cream Place vaginally once a week.        . Cyanocobalamin (B-12) 1000 MCG CAPS Take 1,000 mcg by mouth 3 (three) times daily.       Marland Kitchen erlotinib (TARCEVA) 100 MG tablet Take 1 tablet (100 mg total) by mouth daily. Take on an empty stomach 1 hour before meals or 2 hours after  30 tablet  2  . loperamide (IMODIUM A-D) 2 MG tablet Take 2 mg by mouth 4 (four) times daily as needed. diarrhea      . loratadine (CLARITIN) 10 MG tablet Take 10 mg by mouth daily.        Marland Kitchen LORazepam (ATIVAN) 0.5 MG tablet Take 1 tablet (0.5 mg total) by mouth every 8 (eight) hours as needed.  30 tablet  0  . Multiple Vitamin (MULTIVITAMIN) tablet Take 1 tablet by mouth daily.        . nabumetone (RELAFEN) 500 MG tablet Take 250 mg by mouth daily as needed. For pain      . omeprazole (PRILOSEC) 10 MG capsule Take 20 mg by mouth daily.       Bertram Gala Glycol-Propyl Glycol (SYSTANE OP) Place 1 drop into both eyes as needed. At night for dry eyes      . tiotropium (SPIRIVA) 18 MCG inhalation capsule Place 18 mcg into  inhaler and inhale daily.         No current facility-administered medications for this visit.    SURGICAL HISTORY:  Past Surgical History  Procedure Laterality Date  . Breast surgery  02/1999    left nipple discharge/ Central duct excision  . Anal fissure repair  07/09/2011  . Knee arthroscopy      R  . Abdominal hysterectomy    . Thumb arthroscopy      L - removed bone spur  . Tonsillectomy    . Appendectomy    . US echocardiography    . Thoracoscopy      w/lung biopsy  . Cardiovascular stress test      REVIEW OF SYSTEMS:  A comprehensive review of systems was negative.   PHYSICAL EXAMINATION: General appearance: alert, cooperative and no distress Head: Normocephalic, without obvious abnormality, atraumatic Neck: no adenopathy Lymph nodes: Cervical, supraclavicular, and axillary nodes normal. Resp: clear to  auscultation bilaterally Cardio: regular rate and rhythm, S1, S2 normal, no murmur, click, rub or gallop GI: soft, non-tender; bowel sounds normal; no masses,  no organomegaly Extremities: extremities normal, atraumatic, no cyanosis or edema Neurologic: Alert and oriented X 3, normal strength and tone. Normal symmetric reflexes. Normal coordination and gait  ECOG PERFORMANCE STATUS: 1 - Symptomatic but completely ambulatory  Blood pressure 115/67, pulse 90, temperature 98.1 F (36.7 C), temperature source Oral, resp. rate 18, height 5\' 3"  (1.6 m), weight 124 lb 8 oz (56.473 kg), SpO2 100.00%.  LABORATORY DATA: Lab Results  Component Value Date   WBC 6.5 07/01/2013   HGB 12.9 07/01/2013   HCT 37.9 07/01/2013   MCV 93.5 07/01/2013   PLT 268 07/01/2013      Chemistry      Component Value Date/Time   NA 142 07/01/2013 1153   NA 140 01/06/2013 0920   NA 145 11/09/2011 1345   K 4.3 07/01/2013 1153   K 3.2* 01/06/2013 0920   K 3.9 11/09/2011 1345   CL 108* 06/03/2013 0939   CL 102 01/06/2013 0920   CL 104 11/09/2011 1345   CO2 29 07/01/2013 1153   CO2 27 01/06/2013 0920   CO2 28 11/09/2011 1345   BUN 28.3* 07/01/2013 1153   BUN 16 01/06/2013 0920   BUN 20 11/09/2011 1345   CREATININE 0.8 07/01/2013 1153   CREATININE 0.92 01/06/2013 0920   CREATININE 0.6 11/09/2011 1345      Component Value Date/Time   CALCIUM 9.4 07/01/2013 1153   CALCIUM 9.5 01/06/2013 0920   CALCIUM 8.7 11/09/2011 1345   ALKPHOS 55 07/01/2013 1153   ALKPHOS 60 01/06/2013 0920   ALKPHOS 52 11/09/2011 1345   AST 26 07/01/2013 1153   AST 28 01/06/2013 0920   AST 20 11/09/2011 1345   ALT 22 07/01/2013 1153   ALT 20 01/06/2013 0920   BILITOT 0.70 07/01/2013 1153   BILITOT 1.2 01/06/2013 0920   BILITOT 0.60 11/09/2011 1345       RADIOGRAPHIC STUDIES: Ct Chest W Contrast  07/01/2013   *RADIOLOGY REPORT*  Clinical Data: Lung cancer with chemotherapy and radiation therapy. Cough and shortness of breath.  CT CHEST WITH CONTRAST  Technique:   Multidetector CT imaging of the chest was performed following the standard protocol during bolus administration of intravenous contrast.  Contrast: 80mL OMNIPAQUE IOHEXOL 300 MG/ML  SOLN  Comparison: 03/27/2013.  Findings: No pathologically enlarged mediastinal, hilar or axillary lymph nodes.  Heart size normal.  No pericardial effusion. Coronary artery calcification.  Radiation  fibrosis and volume loss are seen in the medial right hemithorax.  Moderate right pleural effusion, stable.  Possible calcified granuloma in the left lower lobe (image 42), unchanged. 2 mm subpleural nodular density (image 48) in the left lower lobe, unchanged.  Airway is otherwise unremarkable.  Incidental imaging of the upper abdomen shows no acute findings. No worrisome lytic or sclerotic lesions.  Degenerative changes are seen in the spine.  IMPRESSION:  1.  Postradiation changes in the right hemithorax without evidence of recurrent or metastatic disease. 2.  Moderate right pleural effusion, stable.   Original Report Authenticated By: Leanna Battles, M.D.    ASSESSMENT AND PLAN: This is a very pleasant 78 years old white female with history of stage IIIa non-small cell lung cancer status post concurrent chemoradiation and currently on treatment with Tarceva more than 4 years and tolerating the reduced dose fairly well with no significant adverse effects. I discussed the scan results with the patient today. I recommended for her to continue with Tarceva 100 mg by mouth daily. She would come back for followup visit in one month for reevaluation and repeat CBC and comprehensive metabolic panel. She was advised to call immediately if she has any concerning symptoms in the interval.  All questions were answered. The patient knows to call the clinic with any problems, questions or concerns. We can certainly see the patient much sooner if necessary.  I spent 15 minutes counseling the patient face to face. The total time spent in the  appointment was 25 minutes.

## 2013-07-04 NOTE — Patient Instructions (Signed)
Continue treatment with Tarceva Followup visit in one month. 

## 2013-07-29 ENCOUNTER — Telehealth: Payer: Self-pay | Admitting: Internal Medicine

## 2013-07-29 ENCOUNTER — Encounter: Payer: Self-pay | Admitting: Internal Medicine

## 2013-07-29 ENCOUNTER — Ambulatory Visit (HOSPITAL_BASED_OUTPATIENT_CLINIC_OR_DEPARTMENT_OTHER): Payer: Medicare Other | Admitting: Internal Medicine

## 2013-07-29 ENCOUNTER — Other Ambulatory Visit (HOSPITAL_BASED_OUTPATIENT_CLINIC_OR_DEPARTMENT_OTHER): Payer: Medicare Other | Admitting: Lab

## 2013-07-29 DIAGNOSIS — C3430 Malignant neoplasm of lower lobe, unspecified bronchus or lung: Secondary | ICD-10-CM

## 2013-07-29 DIAGNOSIS — C3490 Malignant neoplasm of unspecified part of unspecified bronchus or lung: Secondary | ICD-10-CM

## 2013-07-29 LAB — COMPREHENSIVE METABOLIC PANEL (CC13)
AST: 26 U/L (ref 5–34)
Alkaline Phosphatase: 55 U/L (ref 40–150)
BUN: 27 mg/dL — ABNORMAL HIGH (ref 7.0–26.0)
Creatinine: 1 mg/dL (ref 0.6–1.1)
Total Bilirubin: 0.9 mg/dL (ref 0.20–1.20)

## 2013-07-29 LAB — CBC WITH DIFFERENTIAL/PLATELET
Basophils Absolute: 0 10*3/uL (ref 0.0–0.1)
EOS%: 5.1 % (ref 0.0–7.0)
HCT: 36.2 % (ref 34.8–46.6)
HGB: 12.3 g/dL (ref 11.6–15.9)
MCH: 31.7 pg (ref 25.1–34.0)
MCV: 93.4 fL (ref 79.5–101.0)
MONO%: 10 % (ref 0.0–14.0)
NEUT%: 73.5 % (ref 38.4–76.8)
RDW: 14.1 % (ref 11.2–14.5)

## 2013-07-29 NOTE — Patient Instructions (Addendum)
CURRENT THERAPY: Tarceva at 100 mg p.o. Daily, started March 19 2013, status post 4 months of treatment.  CHEMOTHERAPY INTENT: Maintenance.  CURRENT # OF CHEMOTHERAPY CYCLES: 53  CURRENT ANTIEMETICS: Compazine  CURRENT SMOKING STATUS: Never smoker  ORAL CHEMOTHERAPY AND CONSENT: Tarceva  CURRENT BISPHOSPHONATES USE: None  PAIN MANAGEMENT: 5/10 low back pain and sciatica currently on naproxen.  NARCOTICS INDUCED CONSTIPATION: None  LIVING WILL AND CODE STATUS: Full code but no long-term resuscitation.

## 2013-07-29 NOTE — Progress Notes (Signed)
[facility] [phone]   [phone]  OFFICE PROGRESS NOTE  [name],[name], MD [address] [place] [address]  DIAGNOSIS AND STAGE: : Stage IIIA nonsmall cell lung cancer, adenocarcinoma, in a never smoker patient with positive EGFR mutation diagnosed in January 2010.   PRIOR THERAPY:  1) Status post concurrent chemoradiation with weekly carboplatin and paclitaxel; last dose given March 21, 2009.  2) Tarceva at 150 mg p.o. daily, status post approximately 48 months of treatment, discontinued secondary to persistent diarrhea.   CURRENT THERAPY: Tarceva at 100 mg p.o. Daily, started March 19 2013, status post 4 months of treatment.  CHEMOTHERAPY INTENT: Maintenance.  CURRENT # OF CHEMOTHERAPY CYCLES: 53  CURRENT ANTIEMETICS: Compazine  CURRENT SMOKING STATUS: Never smoker  ORAL CHEMOTHERAPY AND CONSENT: Tarceva  CURRENT BISPHOSPHONATES USE: None  PAIN MANAGEMENT: 5/10 low back pain and sciatica currently on naproxen.  NARCOTICS INDUCED CONSTIPATION: None  LIVING WILL AND CODE STATUS:  Full code but no long-term resuscitation.  INTERVAL HISTORY: Dana [name] 78 y.o. female returns to the clinic today for followup visit. The patient is feeling fine today with no specific complaints except for the low back pain with radiation to the right lower extremity secondary to sciatica. The patient has similar pain in the past and was treated by her primary care physician with pain medication and NSAIDs. She denied having any significant skin rash or diarrhea. The patient denied having any chest pain, shortness breath, cough or hemoptysis. She has no weight loss or night sweats. She has repeat CBC and comprehensive metabolic panel and she is here today for evaluation and management any adverse effect of her treatment  MEDICAL HISTORY: Past Medical History  Diagnosis Date  . Hemorrhoid   . COPD (chronic obstructive pulmonary disease)   .  Shortness of breath   . GERD (gastroesophageal reflux disease)   . Arthritis   . Cancer     lung, currently being treated with Oral med    ALLERGIES:  is allergic to doxycycline and amoxicillin.  MEDICATIONS:  Current Outpatient Prescriptions  Medication Sig Dispense Refill  . aspirin EC 81 MG tablet Take 81 mg by mouth daily.      . calcium carbonate (OS-CAL) 600 MG TABS Take 600 mg by mouth 2 (two) times daily with a meal.        . cholecalciferol (VITAMIN D) 1000 UNITS tablet Take 2,000 Units by mouth 2 (two) times daily.      Marland Kitchen co-enzyme Q-10 30 MG capsule Take 30 mg by mouth daily.      Marland Kitchen conjugated estrogens (PREMARIN) vaginal cream Place vaginally once a week.        . Cyanocobalamin (B-12) 1000 MCG CAPS Take 1,000 mcg by mouth 3 (three) times daily.       Marland Kitchen erlotinib (TARCEVA) 100 MG tablet Take 1 tablet (100 mg total) by mouth daily. Take on an empty stomach 1 hour before meals or 2 hours after  30 tablet  2  . loratadine (CLARITIN) 10 MG tablet Take 10 mg by mouth daily.        . Multiple Vitamin (MULTIVITAMIN) tablet Take 1 tablet by mouth daily.        . nabumetone (RELAFEN) 500 MG tablet Take 250 mg by mouth daily as needed. For pain      . naproxen (NAPROSYN) 250 MG tablet Take 250 mg by mouth 2 (two) times daily with a meal.      .  omeprazole (PRILOSEC) 10 MG capsule Take 20 mg by mouth daily.       Bertram Gala Glycol-Propyl Glycol (SYSTANE OP) Place 1 drop into both eyes as needed. At night for dry eyes      . tiotropium (SPIRIVA) 18 MCG inhalation capsule Place 18 mcg into inhaler and inhale daily.        Marland Kitchen loperamide (IMODIUM A-D) 2 MG tablet Take 2 mg by mouth 4 (four) times daily as needed. diarrhea      . LORazepam (ATIVAN) 0.5 MG tablet Take 1 tablet (0.5 mg total) by mouth every 8 (eight) hours as needed.  30 tablet  0   No current facility-administered medications for this visit.    SURGICAL HISTORY:  Past Surgical History  Procedure Laterality Date  .  Breast surgery  02/1999    left nipple discharge/ Central duct excision  . Anal fissure repair  07/09/2011  . Knee arthroscopy      R  . Abdominal hysterectomy    . Thumb arthroscopy      L - removed bone spur  . Tonsillectomy    . Appendectomy    . US echocardiography    . Thoracoscopy      w/lung biopsy  . Cardiovascular stress test      REVIEW OF SYSTEMS:  A comprehensive review of systems was negative except for: Musculoskeletal: positive for back pain   PHYSICAL EXAMINATION: General appearance: alert, cooperative and no distress Head: Normocephalic, without obvious abnormality, atraumatic Neck: no adenopathy Lymph nodes: Cervical, supraclavicular, and axillary nodes normal. Resp: clear to auscultation bilaterally Cardio: regular rate and rhythm, S1, S2 normal, no murmur, click, rub or gallop GI: soft, non-tender; bowel sounds normal; no masses,  no organomegaly Extremities: extremities normal, atraumatic, no cyanosis or edema  ECOG PERFORMANCE STATUS: 1 - Symptomatic but completely ambulatory  Blood pressure 103/67, pulse 91, temperature 97.7 F (36.5 C), temperature source Oral, resp. rate 20, height 5\' 3"  (1.6 m), weight 125 lb 8 oz (56.926 kg).  LABORATORY DATA: Lab Results  Component Value Date   WBC 6.7 07/29/2013   HGB 12.3 07/29/2013   HCT 36.2 07/29/2013   MCV 93.4 07/29/2013   PLT 237 07/29/2013      Chemistry      Component Value Date/Time   NA 141 07/29/2013 1118   NA 140 01/06/2013 0920   NA 145 11/09/2011 1345   K 5.0 07/29/2013 1118   K 3.2* 01/06/2013 0920   K 3.9 11/09/2011 1345   CL 108* 06/03/2013 0939   CL 102 01/06/2013 0920   CL 104 11/09/2011 1345   CO2 27 07/29/2013 1118   CO2 27 01/06/2013 0920   CO2 28 11/09/2011 1345   BUN 27.0* 07/29/2013 1118   BUN 16 01/06/2013 0920   BUN 20 11/09/2011 1345   CREATININE 1.0 07/29/2013 1118   CREATININE 0.92 01/06/2013 0920   CREATININE 0.6 11/09/2011 1345      Component Value Date/Time   CALCIUM 9.5 07/29/2013 1118     CALCIUM 9.5 01/06/2013 0920   CALCIUM 8.7 11/09/2011 1345   ALKPHOS 55 07/29/2013 1118   ALKPHOS 60 01/06/2013 0920   ALKPHOS 52 11/09/2011 1345   AST 26 07/29/2013 1118   AST 28 01/06/2013 0920   AST 20 11/09/2011 1345   ALT 19 07/29/2013 1118   ALT 20 01/06/2013 0920   ALT 21 11/09/2011 1345   BILITOT 0.90 07/29/2013 1118   BILITOT 1.2 01/06/2013 0920   BILITOT 0.60  11/09/2011 1345       RADIOGRAPHIC STUDIES: Ct Chest W Contrast  07/01/2013   *RADIOLOGY REPORT*  Clinical Data: Lung cancer with chemotherapy and radiation therapy. Cough and shortness of breath.  CT CHEST WITH CONTRAST  Technique:  Multidetector CT imaging of the chest was performed following the standard protocol during bolus administration of intravenous contrast.  Contrast: 80mL OMNIPAQUE IOHEXOL 300 MG/ML  SOLN  Comparison: 03/27/2013.  Findings: No pathologically enlarged mediastinal, hilar or axillary lymph nodes.  Heart size normal.  No pericardial effusion. Coronary artery calcification.  Radiation fibrosis and volume loss are seen in the medial right hemithorax.  Moderate right pleural effusion, stable.  Possible calcified granuloma in the left lower lobe (image 42), unchanged. 2 mm subpleural nodular density (image 48) in the left lower lobe, unchanged.  Airway is otherwise unremarkable.  Incidental imaging of the upper abdomen shows no acute findings. No worrisome lytic or sclerotic lesions.  Degenerative changes are seen in the spine.  IMPRESSION:  1.  Postradiation changes in the right hemithorax without evidence of recurrent or metastatic disease. 2.  Moderate right pleural effusion, stable.   Original Report Authenticated By: Leanna Battles, M.D.    ASSESSMENT AND PLAN: This is a very pleasant 78 years old white female with stage IIIa non-small cell lung cancer currently on treatment with Tarceva for the last 52 months and tolerating her treatment fairly well with no significant adverse effects. She is complaining of low  back pain and sciatica symptoms. I advised the patient to contact her primary care physician for further evaluation of this area and she may need referral to orthopedic surgeon for management of this condition if needed. The patient is tolerating her treatment with Tarceva fairly well. She would come back for followup visit in one month for reevaluation. She was advised to call immediately if she has any concerning symptoms in the interval. The patient voices understanding of current disease status and treatment options and is in agreement with the current care plan.  All questions were answered. The patient knows to call the clinic with any problems, questions or concerns. We can certainly see the patient much sooner if necessary.

## 2013-07-29 NOTE — Telephone Encounter (Signed)
, °

## 2013-07-30 ENCOUNTER — Other Ambulatory Visit: Payer: Self-pay

## 2013-07-31 ENCOUNTER — Telehealth: Payer: Self-pay | Admitting: *Deleted

## 2013-07-31 NOTE — Telephone Encounter (Signed)
Dermatology progress note from Dr Leonie Man dated 07/30/13.

## 2013-08-03 ENCOUNTER — Encounter: Payer: Self-pay | Admitting: *Deleted

## 2013-08-03 NOTE — Progress Notes (Signed)
RECEIVED A FAX FROM [facility] CONCERNING A CONFIRMATION OF PRESCRIPTION SHIPMENT FOR TARCEVA ON 07/31/13.

## 2013-08-27 ENCOUNTER — Ambulatory Visit: Payer: Medicare Other | Admitting: Physical Therapy

## 2013-08-28 ENCOUNTER — Telehealth: Payer: Self-pay | Admitting: Medical Oncology

## 2013-08-28 DIAGNOSIS — C3490 Malignant neoplasm of unspecified part of unspecified bronchus or lung: Secondary | ICD-10-CM

## 2013-08-28 MED ORDER — ERLOTINIB HCL 100 MG PO TABS: 100.0000 mg | ORAL_TABLET | Freq: Every day | ORAL | Status: DC

## 2013-08-28 NOTE — Telephone Encounter (Signed)
faxed refill to [facility]

## 2013-08-31 ENCOUNTER — Telehealth: Payer: Self-pay | Admitting: Internal Medicine

## 2013-08-31 ENCOUNTER — Encounter: Payer: Self-pay | Admitting: Physician Assistant

## 2013-08-31 ENCOUNTER — Ambulatory Visit (HOSPITAL_BASED_OUTPATIENT_CLINIC_OR_DEPARTMENT_OTHER): Payer: Medicare Other | Admitting: Physician Assistant

## 2013-08-31 ENCOUNTER — Other Ambulatory Visit (HOSPITAL_BASED_OUTPATIENT_CLINIC_OR_DEPARTMENT_OTHER): Payer: Medicare Other | Admitting: Lab

## 2013-08-31 VITALS — BP 125/78 | HR 95 | Temp 98.2°F | Resp 20 | Ht 63.0 in | Wt 128.0 lb

## 2013-08-31 DIAGNOSIS — C3490 Malignant neoplasm of unspecified part of unspecified bronchus or lung: Secondary | ICD-10-CM

## 2013-08-31 DIAGNOSIS — M545 Low back pain: Secondary | ICD-10-CM

## 2013-08-31 DIAGNOSIS — C342 Malignant neoplasm of middle lobe, bronchus or lung: Secondary | ICD-10-CM

## 2013-08-31 LAB — CBC WITH DIFFERENTIAL/PLATELET
Basophils Absolute: 0 10*3/uL (ref 0.0–0.1)
EOS%: 5.4 % (ref 0.0–7.0)
HCT: 37.8 % (ref 34.8–46.6)
HGB: 12.9 g/dL (ref 11.6–15.9)
MCH: 31.9 pg (ref 25.1–34.0)
MCV: 93.7 fL (ref 79.5–101.0)
MONO%: 7.3 % (ref 0.0–14.0)
NEUT%: 76.9 % — ABNORMAL HIGH (ref 38.4–76.8)
lymph#: 0.7 10*3/uL — ABNORMAL LOW (ref 0.9–3.3)

## 2013-08-31 LAB — COMPREHENSIVE METABOLIC PANEL (CC13)
AST: 24 U/L (ref 5–34)
BUN: 20.2 mg/dL (ref 7.0–26.0)
Calcium: 9.3 mg/dL (ref 8.4–10.4)
Chloride: 107 mEq/L (ref 98–109)
Creatinine: 0.8 mg/dL (ref 0.6–1.1)

## 2013-08-31 NOTE — Telephone Encounter (Signed)
gv and printed appt sched and avs for pt for OCT. °

## 2013-08-31 NOTE — Progress Notes (Addendum)
[facility] [phone]   [phone]  SHARED VISIT PROGRESS NOTE  [name],[name], MD [address] [place] [address]  DIAGNOSIS AND STAGE: : Stage IIIA nonsmall cell lung cancer, adenocarcinoma, in a never smoker patient with positive EGFR mutation diagnosed in January 2010.   PRIOR THERAPY:  1) Status post concurrent chemoradiation with weekly carboplatin and paclitaxel; last dose given March 21, 2009.  2) Tarceva at 150 mg p.o. daily, status post approximately 48 months of treatment, discontinued secondary to persistent diarrhea.   CURRENT THERAPY: Tarceva at 100 mg p.o. Daily, started March 19 2013, status post 5 months of treatment.  CHEMOTHERAPY INTENT: Maintenance.  CURRENT # OF CHEMOTHERAPY CYCLES: 54  CURRENT ANTIEMETICS: Compazine  CURRENT SMOKING STATUS: Never smoker  ORAL CHEMOTHERAPY AND CONSENT: Tarceva  CURRENT BISPHOSPHONATES USE: None  PAIN MANAGEMENT: 5/10 low back pain and sciatica currently on naproxen.  NARCOTICS INDUCED CONSTIPATION: None  LIVING WILL AND CODE STATUS:  Full code but no long-term resuscitation.  INTERVAL HISTORY: Dana [name] 78 y.o. female returns to the clinic today for followup visit. The patient is feeling fine today with no specific complaints except for continued low back pain with radiation to the right lower extremity secondary to sciatica. The patient has similar pain in the past and was treated by her primary care physician with pain medication and NSAIDs. She also has had some skin eruptions on her right wrist and forearm that are being followed by her dermatologist. She is tolerating the decreased dose of Tarceva at 100 mg by mouth daily much better. She's not had any episodes of diarrhea and she's had decreased skin dryness. She denied having any significant skin rash. The patient denied having any chest pain, shortness breath, cough or hemoptysis. She has no weight loss or night  sweats. She has repeat CBC and comprehensive metabolic panel and she is here today for evaluation and management any adverse effect of her treatment  MEDICAL HISTORY: Past Medical History  Diagnosis Date  . Hemorrhoid   . COPD (chronic obstructive pulmonary disease)   . Shortness of breath   . GERD (gastroesophageal reflux disease)   . Arthritis   . Cancer     lung, currently being treated with Oral med    ALLERGIES:  is allergic to amoxicillin and doxycycline.  MEDICATIONS:  Current Outpatient Prescriptions  Medication Sig Dispense Refill  . aspirin EC 81 MG tablet Take 81 mg by mouth daily.      . calcium carbonate (OS-CAL) 600 MG TABS Take 600 mg by mouth 2 (two) times daily with a meal.        . cholecalciferol (VITAMIN D) 1000 UNITS tablet Take 2,000 Units by mouth 2 (two) times daily.      Marland Kitchen co-enzyme Q-10 30 MG capsule Take 30 mg by mouth daily.      Marland Kitchen conjugated estrogens (PREMARIN) vaginal cream Place vaginally once a week.        . Cyanocobalamin (B-12) 1000 MCG CAPS Take 1,000 mcg by mouth 3 (three) times daily.       Marland Kitchen erlotinib (TARCEVA) 100 MG tablet Take 1 tablet (100 mg total) by mouth daily. Take on an empty stomach 1 hour before meals or 2 hours after  30 tablet  0  . loperamide (IMODIUM A-D) 2 MG tablet Take 2 mg by mouth 4 (four) times daily as needed. diarrhea      . loratadine (CLARITIN) 10 MG tablet  Take 10 mg by mouth daily.        Marland Kitchen LORazepam (ATIVAN) 0.5 MG tablet Take 1 tablet (0.5 mg total) by mouth every 8 (eight) hours as needed.  30 tablet  0  . Multiple Vitamin (MULTIVITAMIN) tablet Take 1 tablet by mouth daily.        . nabumetone (RELAFEN) 500 MG tablet Take 250 mg by mouth daily as needed. For pain      . naproxen (NAPROSYN) 250 MG tablet Take 250 mg by mouth 2 (two) times daily with a meal.      . omeprazole (PRILOSEC) 10 MG capsule Take 20 mg by mouth daily.       Bertram Gala Glycol-Propyl Glycol (SYSTANE OP) Place 1 drop into both eyes as needed.  At night for dry eyes      . tiotropium (SPIRIVA) 18 MCG inhalation capsule Place 18 mcg into inhaler and inhale daily.         No current facility-administered medications for this visit.    SURGICAL HISTORY:  Past Surgical History  Procedure Laterality Date  . Breast surgery  02/1999    left nipple discharge/ Central duct excision  . Anal fissure repair  07/09/2011  . Knee arthroscopy      R  . Abdominal hysterectomy    . Thumb arthroscopy      L - removed bone spur  . Tonsillectomy    . Appendectomy    . US echocardiography    . Thoracoscopy      w/lung biopsy  . Cardiovascular stress test      REVIEW OF SYSTEMS:  A comprehensive review of systems was negative except for: Musculoskeletal: positive for back pain   PHYSICAL EXAMINATION: General appearance: alert, cooperative and no distress Head: Normocephalic, without obvious abnormality, atraumatic Neck: no adenopathy Lymph nodes: Cervical, supraclavicular, and axillary nodes normal. Resp: clear to auscultation bilaterally Cardio: regular rate and rhythm, S1, S2 normal, no murmur, click, rub or gallop GI: soft, non-tender; bowel sounds normal; no masses,  no organomegaly Extremities: extremities normal, atraumatic, no cyanosis or edema  ECOG PERFORMANCE STATUS: 1 - Symptomatic but completely ambulatory  Blood pressure 125/78, pulse 95, temperature 98.2 F (36.8 C), temperature source Oral, resp. rate 20, height 5\' 3"  (1.6 m), weight 128 lb (58.06 kg).  LABORATORY DATA: Lab Results  Component Value Date   WBC 6.8 08/31/2013   HGB 12.9 08/31/2013   HCT 37.8 08/31/2013   MCV 93.7 08/31/2013   PLT 294 08/31/2013      Chemistry      Component Value Date/Time   NA 143 08/31/2013 1015   NA 140 01/06/2013 0920   NA 145 11/09/2011 1345   K 4.2 08/31/2013 1015   K 3.2* 01/06/2013 0920   K 3.9 11/09/2011 1345   CL 108* 06/03/2013 0939   CL 102 01/06/2013 0920   CL 104 11/09/2011 1345   CO2 28 08/31/2013 1015   CO2 27 01/06/2013 0920    CO2 28 11/09/2011 1345   BUN 20.2 08/31/2013 1015   BUN 16 01/06/2013 0920   BUN 20 11/09/2011 1345   CREATININE 0.8 08/31/2013 1015   CREATININE 0.92 01/06/2013 0920   CREATININE 0.6 11/09/2011 1345      Component Value Date/Time   CALCIUM 9.3 08/31/2013 1015   CALCIUM 9.5 01/06/2013 0920   CALCIUM 8.7 11/09/2011 1345   ALKPHOS 55 08/31/2013 1015   ALKPHOS 60 01/06/2013 0920   ALKPHOS 52 11/09/2011 1345   AST  24 08/31/2013 1015   AST 28 01/06/2013 0920   AST 20 11/09/2011 1345   ALT 22 08/31/2013 1015   ALT 20 01/06/2013 0920   ALT 21 11/09/2011 1345   BILITOT 0.68 08/31/2013 1015   BILITOT 1.2 01/06/2013 0920   BILITOT 0.60 11/09/2011 1345       RADIOGRAPHIC STUDIES: Ct Chest W Contrast  07/01/2013   *RADIOLOGY REPORT*  Clinical Data: Lung cancer with chemotherapy and radiation therapy. Cough and shortness of breath.  CT CHEST WITH CONTRAST  Technique:  Multidetector CT imaging of the chest was performed following the standard protocol during bolus administration of intravenous contrast.  Contrast: 80mL OMNIPAQUE IOHEXOL 300 MG/ML  SOLN  Comparison: 03/27/2013.  Findings: No pathologically enlarged mediastinal, hilar or axillary lymph nodes.  Heart size normal.  No pericardial effusion. Coronary artery calcification.  Radiation fibrosis and volume loss are seen in the medial right hemithorax.  Moderate right pleural effusion, stable.  Possible calcified granuloma in the left lower lobe (image 42), unchanged. 2 mm subpleural nodular density (image 48) in the left lower lobe, unchanged.  Airway is otherwise unremarkable.  Incidental imaging of the upper abdomen shows no acute findings. No worrisome lytic or sclerotic lesions.  Degenerative changes are seen in the spine.  IMPRESSION:  1.  Postradiation changes in the right hemithorax without evidence of recurrent or metastatic disease. 2.  Moderate right pleural effusion, stable.   Original Report Authenticated By: Leanna Battles, M.D.    ASSESSMENT AND  PLAN: This is a very pleasant 78 years old white female with stage IIIa non-small cell lung cancer currently on treatment with Tarceva for the last 53 months and tolerating her treatment fairly well with no significant adverse effects. She is complaining of continued low back pain and sciatica symptoms. She is to start physical therapy tomorrow to further address these complaints. This is been ordered by her primary care physician. The patient is tolerating her treatment with Tarceva fairly well. Patient was discussed with also seen by Dr. Arbutus Ped. She will followup in one month with a repeat CBC differential, C. met and CT of the chest with contrast to reevaluate her disease.  Laural Benes, Caidyn Henricksen E, PA-C   She was advised to call immediately if she has any concerning symptoms in the interval.  The patient voices understanding of current disease status and treatment options and is in agreement with the current care plan.  All questions were answered. The patient knows to call the clinic with any problems, questions or concerns. We can certainly see the patient much sooner if necessary.   ADDENDUM: Hematology/Oncology Attending: I have a face to face encounter with the patient. I recommended her care plan. The patient is doing fine today with no specific complaints. She is tolerating her current treatment with Tarceva 100 mg by mouth daily. She denied having any significant skin rash or diarrhea. I advised the patient to continue her current treatment with the same dose of Tarceva. I would see her back for follow up visit in one month with repeat CT scan of the chest for reevaluation of her disease. She was advised to call immediately if she has any concerning symptoms in the interval. Lajuana Matte., MD 09/01/2013

## 2013-08-31 NOTE — Patient Instructions (Addendum)
Continue taking Tarceva 100 mg by mouth daily Followup with Dr. Arbutus Ped in one month with a restaging CT scan of your chest to reevaluate her disease Proceed with your physical therapy for your low back pain/sciatica pain as ordered by your primary care physician

## 2013-09-03 NOTE — Telephone Encounter (Signed)
RECEIVED A FAX FROM [facility] CONCERNING A CONFIRMATION OF PRESCRIPTION SHIPMENT FOR TARCEVA ON 09/02/13.

## 2013-09-25 ENCOUNTER — Other Ambulatory Visit (HOSPITAL_BASED_OUTPATIENT_CLINIC_OR_DEPARTMENT_OTHER): Payer: Medicare Other | Admitting: Lab

## 2013-09-25 ENCOUNTER — Ambulatory Visit (HOSPITAL_COMMUNITY)
Admission: RE | Admit: 2013-09-25 | Discharge: 2013-09-25 | Disposition: A | Discharge status: Home / Self Care | Payer: Medicare Other | Source: Ambulatory Visit | Attending: Physician Assistant | Admitting: Physician Assistant

## 2013-09-25 DIAGNOSIS — J90 Pleural effusion, not elsewhere classified: Secondary | ICD-10-CM | POA: Insufficient documentation

## 2013-09-25 DIAGNOSIS — C3490 Malignant neoplasm of unspecified part of unspecified bronchus or lung: Secondary | ICD-10-CM

## 2013-09-25 DIAGNOSIS — C342 Malignant neoplasm of middle lobe, bronchus or lung: Secondary | ICD-10-CM

## 2013-09-25 LAB — COMPREHENSIVE METABOLIC PANEL (CC13)
AST: 25 U/L (ref 5–34)
Albumin: 3.4 g/dL — ABNORMAL LOW (ref 3.5–5.0)
BUN: 16.3 mg/dL (ref 7.0–26.0)
CO2: 27 mEq/L (ref 22–29)
Calcium: 9.3 mg/dL (ref 8.4–10.4)
Chloride: 108 mEq/L (ref 98–109)
Creatinine: 0.8 mg/dL (ref 0.6–1.1)
Glucose: 89 mg/dl (ref 70–140)
Potassium: 4.1 mEq/L (ref 3.5–5.1)

## 2013-09-25 LAB — CBC WITH DIFFERENTIAL/PLATELET
Basophils Absolute: 0 10*3/uL (ref 0.0–0.1)
Eosinophils Absolute: 0.3 10*3/uL (ref 0.0–0.5)
HCT: 38.4 % (ref 34.8–46.6)
HGB: 12.9 g/dL (ref 11.6–15.9)
MCH: 31.4 pg (ref 25.1–34.0)
MONO#: 0.5 10*3/uL (ref 0.1–0.9)
NEUT#: 3.4 10*3/uL (ref 1.5–6.5)
NEUT%: 68.6 % (ref 38.4–76.8)
RDW: 13.8 % (ref 11.2–14.5)
lymph#: 0.7 10*3/uL — ABNORMAL LOW (ref 0.9–3.3)

## 2013-09-25 MED ORDER — IOHEXOL 300 MG/ML  SOLN
80.0000 mL | Freq: Once | INTRAMUSCULAR | Status: AC | PRN
  Administered 2013-09-25: 80 mL via INTRAVENOUS

## 2013-09-29 ENCOUNTER — Telehealth: Payer: Self-pay | Admitting: Internal Medicine

## 2013-09-29 ENCOUNTER — Ambulatory Visit (HOSPITAL_BASED_OUTPATIENT_CLINIC_OR_DEPARTMENT_OTHER): Payer: Medicare Other | Admitting: Internal Medicine

## 2013-09-29 ENCOUNTER — Encounter: Payer: Self-pay | Admitting: Internal Medicine

## 2013-09-29 VITALS — BP 104/66 | HR 86 | Temp 96.7°F | Resp 19 | Ht 63.0 in | Wt 127.8 lb

## 2013-09-29 DIAGNOSIS — C3410 Malignant neoplasm of upper lobe, unspecified bronchus or lung: Secondary | ICD-10-CM

## 2013-09-29 DIAGNOSIS — C3490 Malignant neoplasm of unspecified part of unspecified bronchus or lung: Secondary | ICD-10-CM

## 2013-09-29 NOTE — Telephone Encounter (Signed)
1104 lab and return ML OV scheduled shh

## 2013-09-29 NOTE — Patient Instructions (Signed)
CURRENT THERAPY: Tarceva at 100 mg p.o. Daily, started March 19 2013, status post 6 months of treatment.  CHEMOTHERAPY INTENT: Maintenance.  CURRENT # OF CHEMOTHERAPY CYCLES: 55  CURRENT ANTIEMETICS: Compazine  CURRENT SMOKING STATUS: Never smoker  ORAL CHEMOTHERAPY AND CONSENT: Tarceva  CURRENT BISPHOSPHONATES USE: None  PAIN MANAGEMENT: 5/10 low back pain and sciatica currently on naproxen.  NARCOTICS INDUCED CONSTIPATION: None  LIVING WILL AND CODE STATUS: Full code but no long-term resuscitation.

## 2013-09-29 NOTE — Progress Notes (Signed)
[facility] [phone]   [phone]  OFFICE PROGRESS NOTE  [name],[name], MD [address] [place] [address]  DIAGNOSIS AND STAGE: : Stage IIIA nonsmall cell lung cancer, adenocarcinoma, in a never smoker patient with positive EGFR mutation diagnosed in January 2010.   PRIOR THERAPY:  1) Status post concurrent chemoradiation with weekly carboplatin and paclitaxel; last dose given March 21, 2009.  2) Tarceva at 150 mg p.o. daily, status post approximately 48 months of treatment, discontinued secondary to persistent diarrhea.   CURRENT THERAPY: Tarceva at 100 mg p.o. Daily, started March 19 2013, status post 6 months of treatment.   CHEMOTHERAPY INTENT: Maintenance.  CURRENT # OF CHEMOTHERAPY CYCLES: 55  CURRENT ANTIEMETICS: Compazine  CURRENT SMOKING STATUS: Never smoker  ORAL CHEMOTHERAPY AND CONSENT: Tarceva  CURRENT BISPHOSPHONATES USE: None  PAIN MANAGEMENT: 5/10 low back pain and sciatica currently on naproxen.  NARCOTICS INDUCED CONSTIPATION: None  LIVING WILL AND CODE STATUS: Full code but no long-term resuscitation.   INTERVAL HISTORY: Dana [name] 78 y.o. female returns to the clinic today for followup visit. The patient is feeling fine today with no specific complaints except for occasional nosebleed from diagnosis. She had this problem for more than 2 years and this usually on the left nostril. She denied having any significant fatigue or weakness. The patient denied having any significant weight loss or night sweats. She has no chest pain, shortness breath, cough or hemoptysis. The patient is tolerating her current treatment was Tarceva 100 mg by mouth daily fairly well with no significant adverse effects. She denied having any significant skin rash or diarrhea. She had repeat CT scan of the chest performed recently and she is here for evaluation and discussion of her scan results.  MEDICAL HISTORY: Past Medical History    Diagnosis Date  . Hemorrhoid   . COPD (chronic obstructive pulmonary disease)   . Shortness of breath   . GERD (gastroesophageal reflux disease)   . Arthritis   . Cancer     lung, currently being treated with Oral med    ALLERGIES:  is allergic to amoxicillin and doxycycline.  MEDICATIONS:  Current Outpatient Prescriptions  Medication Sig Dispense Refill  . aspirin EC 81 MG tablet Take 81 mg by mouth daily.      . calcium carbonate (OS-CAL) 600 MG TABS Take 600 mg by mouth 2 (two) times daily with a meal.        . cholecalciferol (VITAMIN D) 1000 UNITS tablet Take 2,000 Units by mouth 2 (two) times daily.      Marland Kitchen co-enzyme Q-10 30 MG capsule Take 30 mg by mouth daily.      Marland Kitchen conjugated estrogens (PREMARIN) vaginal cream Place vaginally once a week.        . Cyanocobalamin (B-12) 1000 MCG CAPS Take 1,000 mcg by mouth 3 (three) times daily.       Marland Kitchen erlotinib (TARCEVA) 100 MG tablet Take 1 tablet (100 mg total) by mouth daily. Take on an empty stomach 1 hour before meals or 2 hours after  30 tablet  0  . loperamide (IMODIUM A-D) 2 MG tablet Take 2 mg by mouth 4 (four) times daily as needed. diarrhea      . loratadine (CLARITIN) 10 MG tablet Take 10 mg by mouth daily.        Marland Kitchen LORazepam (ATIVAN) 0.5 MG tablet Take 1 tablet (0.5 mg total) by mouth every 8 (eight) hours as  needed.  30 tablet  0  . Multiple Vitamin (MULTIVITAMIN) tablet Take 1 tablet by mouth daily.        . nabumetone (RELAFEN) 500 MG tablet Take 250 mg by mouth daily as needed. For pain      . naproxen (NAPROSYN) 250 MG tablet Take 250 mg by mouth 2 (two) times daily with a meal.      . omeprazole (PRILOSEC) 10 MG capsule Take 20 mg by mouth daily.       Bertram Gala Glycol-Propyl Glycol (SYSTANE OP) Place 1 drop into both eyes as needed. At night for dry eyes      . tiotropium (SPIRIVA) 18 MCG inhalation capsule Place 18 mcg into inhaler and inhale daily.         No current facility-administered medications for this  visit.    SURGICAL HISTORY:  Past Surgical History  Procedure Laterality Date  . Breast surgery  02/1999    left nipple discharge/ Central duct excision  . Anal fissure repair  07/09/2011  . Knee arthroscopy      R  . Abdominal hysterectomy    . Thumb arthroscopy      L - removed bone spur  . Tonsillectomy    . Appendectomy    . US echocardiography    . Thoracoscopy      w/lung biopsy  . Cardiovascular stress test      REVIEW OF SYSTEMS:  A comprehensive review of systems was negative.   PHYSICAL EXAMINATION: General appearance: alert, cooperative and no distress Head: Normocephalic, without obvious abnormality, atraumatic Neck: no adenopathy, no JVD, supple, symmetrical, trachea midline and thyroid not enlarged, symmetric, no tenderness/mass/nodules Lymph nodes: Cervical, supraclavicular, and axillary nodes normal. Resp: clear to auscultation bilaterally Cardio: regular rate and rhythm, S1, S2 normal, no murmur, click, rub or gallop GI: soft, non-tender; bowel sounds normal; no masses,  no organomegaly Extremities: extremities normal, atraumatic, no cyanosis or edema  ECOG PERFORMANCE STATUS: 1 - Symptomatic but completely ambulatory  Blood pressure 104/66, pulse 86, temperature 96.7 F (35.9 C), temperature source Oral, resp. rate 19, height 5\' 3"  (1.6 m), weight 127 lb 12.8 oz (57.97 kg).  LABORATORY DATA: Lab Results  Component Value Date   WBC 5.0 09/25/2013   HGB 12.9 09/25/2013   HCT 38.4 09/25/2013   MCV 93.4 09/25/2013   PLT 263 09/25/2013      Chemistry      Component Value Date/Time   NA 145 09/25/2013 1029   NA 140 01/06/2013 0920   NA 145 11/09/2011 1345   K 4.1 09/25/2013 1029   K 3.2* 01/06/2013 0920   K 3.9 11/09/2011 1345   CL 108* 06/03/2013 0939   CL 102 01/06/2013 0920   CL 104 11/09/2011 1345   CO2 27 09/25/2013 1029   CO2 27 01/06/2013 0920   CO2 28 11/09/2011 1345   BUN 16.3 09/25/2013 1029   BUN 16 01/06/2013 0920   BUN 20 11/09/2011 1345    CREATININE 0.8 09/25/2013 1029   CREATININE 0.92 01/06/2013 0920   CREATININE 0.6 11/09/2011 1345      Component Value Date/Time   CALCIUM 9.3 09/25/2013 1029   CALCIUM 9.5 01/06/2013 0920   CALCIUM 8.7 11/09/2011 1345   ALKPHOS 55 09/25/2013 1029   ALKPHOS 60 01/06/2013 0920   ALKPHOS 52 11/09/2011 1345   AST 25 09/25/2013 1029   AST 28 01/06/2013 0920   AST 20 11/09/2011 1345   ALT 21 09/25/2013 1029   ALT  20 01/06/2013 0920   ALT 21 11/09/2011 1345   BILITOT 0.96 09/25/2013 1029   BILITOT 1.2 01/06/2013 0920   BILITOT 0.60 11/09/2011 1345       RADIOGRAPHIC STUDIES: Ct Chest W Contrast  09/25/2013   CLINICAL DATA:  Followup lung cancer  EXAM: CT CHEST WITH CONTRAST  TECHNIQUE: Multidetector CT imaging of the chest was performed during intravenous contrast administration.  CONTRAST:  80mL OMNIPAQUE IOHEXOL 300 MG/ML  SOLN  COMPARISON:  None.  FINDINGS: There is a moderate right pleural effusion present. This is similar in volume to the previous exam. No suspicious pulmonary nodule or mass identified. Paramediastinal radiation change is again noted within the right lung.  The trachea appears patent and is midline. The heart size is normal. No pericardial effusion identified. 6 mm low right paratracheal lymph node is stable from previous exam. No enlarged or enlarging mediastinal or hilar lymph nodes.  No axillary or supraclavicular adenopathy identified. Limited imaging through the upper abdomen is on unremarkable. No acute findings identified.  Review of the visualized osseous structures is notable for scoliosis and multi level spondylosis. No aggressive lytic or sclerotic bone lesions.  IMPRESSION: 1. Post radiation change in the right hemithorax without evidence of recurrent or metastatic disease.  2. Moderate right pleural effusion, stable.   Electronically Signed   By: Signa Kell M.D.   On: 09/25/2013 13:50    ASSESSMENT AND PLAN: This is a very pleasant 78 years old white female with  history of stage IIIa non-small cell lung cancer status post concurrent chemoradiation and has been on treatment with Tarceva for the last 54 months with no significant evidence for disease progression. I discussed the scan results with the patient today. I recommended for her to continue treatment with Tarceva 100 mg by mouth daily. She would come back for followup visit in one month with repeat CBC and comprehensive metabolic panel. She was advised to call immediately if she has any concerning symptoms in the interval.  The patient voices understanding of current disease status and treatment options and is in agreement with the current care plan.  All questions were answered. The patient knows to call the clinic with any problems, questions or concerns. We can certainly see the patient much sooner if necessary.

## 2013-09-30 ENCOUNTER — Telehealth: Payer: Self-pay | Admitting: *Deleted

## 2013-09-30 ENCOUNTER — Other Ambulatory Visit: Payer: Self-pay | Admitting: *Deleted

## 2013-09-30 DIAGNOSIS — C3490 Malignant neoplasm of unspecified part of unspecified bronchus or lung: Secondary | ICD-10-CM

## 2013-09-30 MED ORDER — ERLOTINIB HCL 100 MG PO TABS: 100.0000 mg | ORAL_TABLET | Freq: Every day | ORAL | Status: DC

## 2013-09-30 NOTE — Addendum Note (Signed)
Addended by: Wandalee Ferdinand on: 09/30/2013 09:34 AM   Modules accepted: Orders

## 2013-09-30 NOTE — Telephone Encounter (Signed)
Refill request for Tarceva to MD desk for approval. 

## 2013-10-27 ENCOUNTER — Ambulatory Visit (HOSPITAL_BASED_OUTPATIENT_CLINIC_OR_DEPARTMENT_OTHER): Payer: Medicare Other | Admitting: Physician Assistant

## 2013-10-27 ENCOUNTER — Telehealth: Payer: Self-pay | Admitting: Internal Medicine

## 2013-10-27 ENCOUNTER — Encounter: Payer: Self-pay | Admitting: Physician Assistant

## 2013-10-27 ENCOUNTER — Other Ambulatory Visit (HOSPITAL_BASED_OUTPATIENT_CLINIC_OR_DEPARTMENT_OTHER): Payer: Medicare Other | Admitting: Lab

## 2013-10-27 DIAGNOSIS — C3490 Malignant neoplasm of unspecified part of unspecified bronchus or lung: Secondary | ICD-10-CM

## 2013-10-27 LAB — CBC WITH DIFFERENTIAL/PLATELET
BASO%: 0.3 % (ref 0.0–2.0)
Basophils Absolute: 0 10*3/uL (ref 0.0–0.1)
EOS%: 4.5 % (ref 0.0–7.0)
Eosinophils Absolute: 0.3 10*3/uL (ref 0.0–0.5)
HCT: 38.5 % (ref 34.8–46.6)
HGB: 12.9 g/dL (ref 11.6–15.9)
LYMPH%: 10 % — ABNORMAL LOW (ref 14.0–49.7)
MCH: 31.1 pg (ref 25.1–34.0)
MCHC: 33.5 g/dL (ref 31.5–36.0)
MCV: 92.9 fL (ref 79.5–101.0)
MONO#: 0.5 10*3/uL (ref 0.1–0.9)
MONO%: 7.9 % (ref 0.0–14.0)
NEUT#: 5.3 10*3/uL (ref 1.5–6.5)
NEUT%: 77.3 % — ABNORMAL HIGH (ref 38.4–76.8)
Platelets: 256 10*3/uL (ref 145–400)
RBC: 4.14 10*6/uL (ref 3.70–5.45)
RDW: 13.4 % (ref 11.2–14.5)
WBC: 6.9 10*3/uL (ref 3.9–10.3)
lymph#: 0.7 10*3/uL — ABNORMAL LOW (ref 0.9–3.3)

## 2013-10-27 LAB — COMPREHENSIVE METABOLIC PANEL (CC13)
ALT: 20 U/L (ref 0–55)
AST: 24 U/L (ref 5–34)
Albumin: 3.2 g/dL — ABNORMAL LOW (ref 3.5–5.0)
Alkaline Phosphatase: 57 U/L (ref 40–150)
CO2: 25 mEq/L (ref 22–29)
Creatinine: 0.8 mg/dL (ref 0.6–1.1)
Potassium: 4.2 mEq/L (ref 3.5–5.1)
Sodium: 141 mEq/L (ref 136–145)
Total Bilirubin: 1.11 mg/dL (ref 0.20–1.20)
Total Protein: 6.1 g/dL — ABNORMAL LOW (ref 6.4–8.3)

## 2013-10-27 NOTE — Progress Notes (Addendum)
[facility] Telephone:([phone]   Fax:([phone]  SHARED VISIT PROGRESS NOTE  [name],[name], MD [address] [place] [address]  DIAGNOSIS AND STAGE: : Stage IIIA nonsmall cell lung cancer, adenocarcinoma, in a never smoker patient with positive EGFR mutation diagnosed in January 2010.   PRIOR THERAPY:  1) Status post concurrent chemoradiation with weekly carboplatin and paclitaxel; last dose given March 21, 2009.  2) Tarceva at 150 mg p.o. daily, status post approximately 48 months of treatment, discontinued secondary to persistent diarrhea.   CURRENT THERAPY: Tarceva at 100 mg p.o. Daily, started March 19 2013, status post 7 months of treatment.   CHEMOTHERAPY INTENT: Maintenance.  CURRENT # OF CHEMOTHERAPY CYCLES: 56  CURRENT ANTIEMETICS: Compazine  CURRENT SMOKING STATUS: Never smoker  ORAL CHEMOTHERAPY AND CONSENT: Tarceva  CURRENT BISPHOSPHONATES USE: None  PAIN MANAGEMENT: 5/10 low back pain and sciatica currently on naproxen.  NARCOTICS INDUCED CONSTIPATION: None  LIVING WILL AND CODE STATUS: Full code but no long-term resuscitation.   INTERVAL HISTORY: Dana [name] 78 y.o. female returns to the clinic today for followup visit. The patient is feeling fine today with no specific complaints except for more frequent nosebleeds from the left nares. She had this problem for more than 2 years but they have been more frequent over the past 2 weeks. She denied having any significant fatigue or weakness. The patient denied having any significant weight loss or night sweats. She has no chest pain, shortness breath, cough or hemoptysis. The patient is tolerating her current treatment was Tarceva 100 mg by mouth daily fairly well with no significant adverse effects. She denied having any significant skin rash or diarrhea. She reports that she received her flu shot from her primary care physician Dr. Uvaldo Rising in September of 2014. She also states that  within the past 5 years she's had a Pneumovax, shingles vaccine and a tetanus booster.  MEDICAL HISTORY: Past Medical History  Diagnosis Date  . Hemorrhoid   . COPD (chronic obstructive pulmonary disease)   . Shortness of breath   . GERD (gastroesophageal reflux disease)   . Arthritis   . Cancer     lung, currently being treated with Oral med    ALLERGIES:  is allergic to amoxicillin and doxycycline.  MEDICATIONS:  Current Outpatient Prescriptions  Medication Sig Dispense Refill  . aspirin EC 81 MG tablet Take 81 mg by mouth daily.      . calcium carbonate (OS-CAL) 600 MG TABS Take 600 mg by mouth 2 (two) times daily with a meal.        . cholecalciferol (VITAMIN D) 1000 UNITS tablet Take 2,000 Units by mouth 2 (two) times daily.      Marland Kitchen co-enzyme Q-10 30 MG capsule Take 30 mg by mouth daily.      Marland Kitchen conjugated estrogens (PREMARIN) vaginal cream Place vaginally once a week.        . Cyanocobalamin (B-12) 1000 MCG CAPS Take 1,000 mcg by mouth 3 (three) times daily.       Marland Kitchen erlotinib (TARCEVA) 100 MG tablet Take 1 tablet (100 mg total) by mouth daily. Take on an empty stomach 1 hour before meals or 2 hours after  30 tablet  0  . loperamide (IMODIUM A-D) 2 MG tablet Take 2 mg by mouth 4 (four) times daily as needed. diarrhea      . loratadine (CLARITIN) 10 MG tablet Take 10 mg by mouth daily.        Marland Kitchen  LORazepam (ATIVAN) 0.5 MG tablet Take 1 tablet (0.5 mg total) by mouth every 8 (eight) hours as needed.  30 tablet  0  . Multiple Vitamin (MULTIVITAMIN) tablet Take 1 tablet by mouth daily.        . naproxen (NAPROSYN) 250 MG tablet Take 250 mg by mouth 2 (two) times daily with a meal.      . omeprazole (PRILOSEC) 10 MG capsule Take 20 mg by mouth daily.       Bertram Gala Glycol-Propyl Glycol (SYSTANE OP) Place 1 drop into both eyes as needed. At night for dry eyes      . TARCEVA 100 MG tablet Take 100 mg by mouth daily.      Marland Kitchen tiotropium (SPIRIVA) 18 MCG inhalation capsule Place 18 mcg  into inhaler and inhale daily.         No current facility-administered medications for this visit.    SURGICAL HISTORY:  Past Surgical History  Procedure Laterality Date  . Breast surgery  02/1999    left nipple discharge/ Central duct excision  . Anal fissure repair  07/09/2011  . Knee arthroscopy      R  . Abdominal hysterectomy    . Thumb arthroscopy      L - removed bone spur  . Tonsillectomy    . Appendectomy    . US echocardiography    . Thoracoscopy      w/lung biopsy  . Cardiovascular stress test      REVIEW OF SYSTEMS:  A comprehensive review of systems was negative except for: Ears, nose, mouth, throat, and face: positive for epistaxis   PHYSICAL EXAMINATION: General appearance: alert, cooperative and no distress Head: Normocephalic, without obvious abnormality, atraumatic Neck: no adenopathy, no JVD, supple, symmetrical, trachea midline and thyroid not enlarged, symmetric, no tenderness/mass/nodules Lymph nodes: Cervical, supraclavicular, and axillary nodes normal. Resp: clear to auscultation bilaterally Cardio: regular rate and rhythm, S1, S2 normal, no murmur, click, rub or gallop GI: soft, non-tender; bowel sounds normal; no masses,  no organomegaly Extremities: extremities normal, atraumatic, no cyanosis or edema  ECOG PERFORMANCE STATUS: 1 - Symptomatic but completely ambulatory  Blood pressure 115/76, pulse 95, temperature 97.2 F (36.2 C), temperature source Oral, resp. rate 18, height 5\' 3"  (1.6 m), weight 128 lb 6.4 oz (58.242 kg), SpO2 97.00%.  LABORATORY DATA: Lab Results  Component Value Date   WBC 6.9 10/27/2013   HGB 12.9 10/27/2013   HCT 38.5 10/27/2013   MCV 92.9 10/27/2013   PLT 256 10/27/2013      Chemistry      Component Value Date/Time   NA 141 10/27/2013 0957   NA 140 01/06/2013 0920   NA 145 11/09/2011 1345   K 4.2 10/27/2013 0957   K 3.2* 01/06/2013 0920   K 3.9 11/09/2011 1345   CL 108* 06/03/2013 0939   CL 102 01/06/2013 0920   CL  104 11/09/2011 1345   CO2 25 10/27/2013 0957   CO2 27 01/06/2013 0920   CO2 28 11/09/2011 1345   BUN 25.4 10/27/2013 0957   BUN 16 01/06/2013 0920   BUN 20 11/09/2011 1345   CREATININE 0.8 10/27/2013 0957   CREATININE 0.92 01/06/2013 0920   CREATININE 0.6 11/09/2011 1345      Component Value Date/Time   CALCIUM 9.6 10/27/2013 0957   CALCIUM 9.5 01/06/2013 0920   CALCIUM 8.7 11/09/2011 1345   ALKPHOS 57 10/27/2013 0957   ALKPHOS 60 01/06/2013 0920   ALKPHOS 52 11/09/2011 1345  AST 24 10/27/2013 0957   AST 28 01/06/2013 0920   AST 20 11/09/2011 1345   ALT 20 10/27/2013 0957   ALT 20 01/06/2013 0920   ALT 21 11/09/2011 1345   BILITOT 1.11 10/27/2013 0957   BILITOT 1.2 01/06/2013 0920   BILITOT 0.60 11/09/2011 1345       RADIOGRAPHIC STUDIES: Ct Chest W Contrast  09/25/2013   CLINICAL DATA:  Followup lung cancer  EXAM: CT CHEST WITH CONTRAST  TECHNIQUE: Multidetector CT imaging of the chest was performed during intravenous contrast administration.  CONTRAST:  80mL OMNIPAQUE IOHEXOL 300 MG/ML  SOLN  COMPARISON:  None.  FINDINGS: There is a moderate right pleural effusion present. This is similar in volume to the previous exam. No suspicious pulmonary nodule or mass identified. Paramediastinal radiation change is again noted within the right lung.  The trachea appears patent and is midline. The heart size is normal. No pericardial effusion identified. 6 mm low right paratracheal lymph node is stable from previous exam. No enlarged or enlarging mediastinal or hilar lymph nodes.  No axillary or supraclavicular adenopathy identified. Limited imaging through the upper abdomen is on unremarkable. No acute findings identified.  Review of the visualized osseous structures is notable for scoliosis and multi level spondylosis. No aggressive lytic or sclerotic bone lesions.  IMPRESSION: 1. Post radiation change in the right hemithorax without evidence of recurrent or metastatic disease.  2. Moderate right pleural  effusion, stable.   Electronically Signed   By: Signa Kell M.D.   On: 09/25/2013 13:50    ASSESSMENT AND PLAN: This is a very pleasant 78 years old white female with history of stage IIIa non-small cell lung cancer status post concurrent chemoradiation and has been on treatment with Tarceva for the last 55 months with no significant evidence for disease progression. Patient was discussed with them also seen by Dr. Arbutus Ped. She will continue on Tarceva at 100 mg by mouth daily. She'll return in one month for another symptom management visit with a repeat CBC differential and C. met. Patient plans to see an ear nose and throat specialist about her recurrent epistaxis.  Laural Benes, Cheyanna Strick E, PA-C   She was advised to call immediately if she has any concerning symptoms in the interval.  The patient voices understanding of current disease status and treatment options and is in agreement with the current care plan.  All questions were answered. The patient knows to call the clinic with any problems, questions or concerns. We can certainly see the patient much sooner if necessary.  ADDENDUM: Hematology/Oncology Attending: I had a face to face encounter with the patient. I recommended her care plan. She is a very pleasant 78 years old white female with locally advanced non-small cell lung cancer, adenocarcinoma with positive EGFR mutation, status post concurrent chemoradiation has been on treatment with Tarceva status post 55 months and tolerating it fairly well. The patient denied having any significant complaints today. I recommended for her to continue on Tarceva with the same dose. She would come back for followup visit in one month's for reevaluation. She was advised to call immediately if she has any concerning symptoms in the interval. MOHAMED,MOHAMED K., MD 11/10/2013  She was advised to call immediately if she has any concerning symptoms in the interval.

## 2013-10-27 NOTE — Telephone Encounter (Signed)
gv pt appt schedule for December.

## 2013-11-02 ENCOUNTER — Other Ambulatory Visit: Payer: Self-pay | Admitting: *Deleted

## 2013-11-02 DIAGNOSIS — C3490 Malignant neoplasm of unspecified part of unspecified bronchus or lung: Secondary | ICD-10-CM

## 2013-11-02 MED ORDER — ERLOTINIB HCL 100 MG PO TABS: 100.0000 mg | ORAL_TABLET | Freq: Every day | ORAL | Status: DC

## 2013-11-04 ENCOUNTER — Other Ambulatory Visit: Payer: Self-pay | Admitting: *Deleted

## 2013-11-04 DIAGNOSIS — C3490 Malignant neoplasm of unspecified part of unspecified bronchus or lung: Secondary | ICD-10-CM

## 2013-11-04 MED ORDER — ERLOTINIB HCL 100 MG PO TABS: 100.0000 mg | ORAL_TABLET | Freq: Every day | ORAL | Status: DC

## 2013-11-11 ENCOUNTER — Telehealth: Payer: Self-pay | Admitting: Medical Oncology

## 2013-11-11 NOTE — Telephone Encounter (Signed)
tarceva scheduled for delivery on 11/04/13

## 2013-11-24 ENCOUNTER — Encounter: Payer: Self-pay | Admitting: Internal Medicine

## 2013-11-24 ENCOUNTER — Telehealth: Payer: Self-pay | Admitting: Internal Medicine

## 2013-11-24 ENCOUNTER — Ambulatory Visit (HOSPITAL_BASED_OUTPATIENT_CLINIC_OR_DEPARTMENT_OTHER): Payer: Medicare Other | Admitting: Internal Medicine

## 2013-11-24 ENCOUNTER — Other Ambulatory Visit (HOSPITAL_BASED_OUTPATIENT_CLINIC_OR_DEPARTMENT_OTHER): Payer: Medicare Other | Admitting: Lab

## 2013-11-24 VITALS — BP 103/65 | HR 91 | Temp 97.9°F | Resp 18 | Ht 63.0 in | Wt 128.9 lb

## 2013-11-24 DIAGNOSIS — C342 Malignant neoplasm of middle lobe, bronchus or lung: Secondary | ICD-10-CM

## 2013-11-24 DIAGNOSIS — C3490 Malignant neoplasm of unspecified part of unspecified bronchus or lung: Secondary | ICD-10-CM

## 2013-11-24 LAB — COMPREHENSIVE METABOLIC PANEL (CC13)
ALT: 18 U/L (ref 0–55)
AST: 24 U/L (ref 5–34)
Anion Gap: 12 mEq/L — ABNORMAL HIGH (ref 3–11)
BUN: 20 mg/dL (ref 7.0–26.0)
Calcium: 9.4 mg/dL (ref 8.4–10.4)
Chloride: 106 mEq/L (ref 98–109)
Creatinine: 0.8 mg/dL (ref 0.6–1.1)
Glucose: 90 mg/dl (ref 70–140)
Total Bilirubin: 0.99 mg/dL (ref 0.20–1.20)

## 2013-11-24 LAB — CBC WITH DIFFERENTIAL/PLATELET
BASO%: 0.5 % (ref 0.0–2.0)
Basophils Absolute: 0 10*3/uL (ref 0.0–0.1)
EOS%: 5 % (ref 0.0–7.0)
Eosinophils Absolute: 0.3 10*3/uL (ref 0.0–0.5)
HCT: 40.7 % (ref 34.8–46.6)
HGB: 13.4 g/dL (ref 11.6–15.9)
LYMPH%: 11.9 % — ABNORMAL LOW (ref 14.0–49.7)
MCH: 31.5 pg (ref 25.1–34.0)
MCHC: 33 g/dL (ref 31.5–36.0)
MCV: 95.4 fL (ref 79.5–101.0)
MONO#: 0.5 10*3/uL (ref 0.1–0.9)
NEUT%: 73.7 % (ref 38.4–76.8)
Platelets: 267 10*3/uL (ref 145–400)

## 2013-11-24 NOTE — Telephone Encounter (Signed)
gv pt appt schedule for january 2015. [facility] will call w/ct scan - pt aware. per 12/2 pof f/u in one month. lb/ct dated for 12/28/13 so f/u appt scheduled for 12/30/13.

## 2013-11-24 NOTE — Progress Notes (Signed)
[facility] [phone]   [phone]  OFFICE PROGRESS NOTE  [name],[name], MD [address] [place] [address]  DIAGNOSIS AND STAGE: : Stage IIIA nonsmall cell lung cancer, adenocarcinoma, in a never smoker patient with positive EGFR mutation diagnosed in January 2010.   PRIOR THERAPY:  1) Status post concurrent chemoradiation with weekly carboplatin and paclitaxel; last dose given March 21, 2009.  2) Tarceva at 150 mg p.o. daily, status post approximately 48 months of treatment, discontinued secondary to persistent diarrhea.   CURRENT THERAPY: Tarceva at 100 mg p.o. Daily, started March 19 2013, status post 7 months of treatment.   CHEMOTHERAPY INTENT: Maintenance.  CURRENT # OF CHEMOTHERAPY CYCLES: 56 CURRENT ANTIEMETICS: Compazine  CURRENT SMOKING STATUS: Never smoker  ORAL CHEMOTHERAPY AND CONSENT: Tarceva  CURRENT BISPHOSPHONATES USE: None  PAIN MANAGEMENT: 5/10 low back pain and sciatica currently on naproxen.  NARCOTICS INDUCED CONSTIPATION: None  LIVING WILL AND CODE STATUS: Full code but no long-term resuscitation.   INTERVAL HISTORY: Dana [name] 78 y.o. female returns to the clinic today for followup visit. She denied having any significant fatigue or weakness. The patient denied having any significant weight loss or night sweats. She has no chest pain, shortness breath, cough or hemoptysis. The patient is tolerating her current treatment with Tarceva 100 mg by mouth daily fairly well with no significant adverse effects. She denied having any significant skin rash or diarrhea.   MEDICAL HISTORY: Past Medical History  Diagnosis Date  . Hemorrhoid   . COPD (chronic obstructive pulmonary disease)   . Shortness of breath   . GERD (gastroesophageal reflux disease)   . Arthritis   . Cancer     lung, currently being treated with Oral med    ALLERGIES:  is allergic to amoxicillin and doxycycline.  MEDICATIONS:    Current Outpatient Prescriptions  Medication Sig Dispense Refill  . aspirin EC 81 MG tablet Take 81 mg by mouth daily.      . calcium carbonate (OS-CAL) 600 MG TABS Take 600 mg by mouth 2 (two) times daily with a meal.        . cholecalciferol (VITAMIN D) 1000 UNITS tablet Take 2,000 Units by mouth 2 (two) times daily.      Marland Kitchen co-enzyme Q-10 30 MG capsule Take 30 mg by mouth daily.      Marland Kitchen conjugated estrogens (PREMARIN) vaginal cream Place vaginally once a week.        . Cyanocobalamin (B-12) 1000 MCG CAPS Take 1,000 mcg by mouth 3 (three) times daily.       Marland Kitchen erlotinib (TARCEVA) 100 MG tablet Take 1 tablet (100 mg total) by mouth daily. Take on an empty stomach 1 hour before meals or 2 hours after  30 tablet  2  . loratadine (CLARITIN) 10 MG tablet Take 10 mg by mouth daily.        Marland Kitchen LORazepam (ATIVAN) 0.5 MG tablet Take 1 tablet (0.5 mg total) by mouth every 8 (eight) hours as needed.  30 tablet  0  . Multiple Vitamin (MULTIVITAMIN) tablet Take 1 tablet by mouth daily.        Marland Kitchen omeprazole (PRILOSEC) 10 MG capsule Take 20 mg by mouth daily.       Bertram Gala Glycol-Propyl Glycol (SYSTANE OP) Place 1 drop into both eyes as needed. At night for dry eyes      . tiotropium (SPIRIVA) 18 MCG inhalation capsule Place 18 mcg  into inhaler and inhale daily.        Marland Kitchen loperamide (IMODIUM A-D) 2 MG tablet Take 2 mg by mouth 4 (four) times daily as needed. diarrhea      . naproxen (NAPROSYN) 250 MG tablet Take 250 mg by mouth 2 (two) times daily with a meal.       No current facility-administered medications for this visit.    SURGICAL HISTORY:  Past Surgical History  Procedure Laterality Date  . Breast surgery  02/1999    left nipple discharge/ Central duct excision  . Anal fissure repair  07/09/2011  . Knee arthroscopy      R  . Abdominal hysterectomy    . Thumb arthroscopy      L - removed bone spur  . Tonsillectomy    . Appendectomy    . US echocardiography    . Thoracoscopy      w/lung  biopsy  . Cardiovascular stress test      REVIEW OF SYSTEMS:  A comprehensive review of systems was negative.   PHYSICAL EXAMINATION: General appearance: alert, cooperative and no distress Head: Normocephalic, without obvious abnormality, atraumatic Neck: no adenopathy, no JVD, supple, symmetrical, trachea midline and thyroid not enlarged, symmetric, no tenderness/mass/nodules Lymph nodes: Cervical, supraclavicular, and axillary nodes normal. Resp: clear to auscultation bilaterally Cardio: regular rate and rhythm, S1, S2 normal, no murmur, click, rub or gallop GI: soft, non-tender; bowel sounds normal; no masses,  no organomegaly Extremities: extremities normal, atraumatic, no cyanosis or edema  ECOG PERFORMANCE STATUS: 1 - Symptomatic but completely ambulatory  Blood pressure 103/65, pulse 91, temperature 97.9 F (36.6 C), temperature source Oral, resp. rate 18, height 5\' 3"  (1.6 m), weight 128 lb 14.4 oz (58.469 kg), SpO2 100.00%.  LABORATORY DATA: Lab Results  Component Value Date   WBC 5.7 11/24/2013   HGB 13.4 11/24/2013   HCT 40.7 11/24/2013   MCV 95.4 11/24/2013   PLT 267 11/24/2013      Chemistry      Component Value Date/Time   NA 141 10/27/2013 0957   NA 140 01/06/2013 0920   NA 145 11/09/2011 1345   K 4.2 10/27/2013 0957   K 3.2* 01/06/2013 0920   K 3.9 11/09/2011 1345   CL 108* 06/03/2013 0939   CL 102 01/06/2013 0920   CL 104 11/09/2011 1345   CO2 25 10/27/2013 0957   CO2 27 01/06/2013 0920   CO2 28 11/09/2011 1345   BUN 25.4 10/27/2013 0957   BUN 16 01/06/2013 0920   BUN 20 11/09/2011 1345   CREATININE 0.8 10/27/2013 0957   CREATININE 0.92 01/06/2013 0920   CREATININE 0.6 11/09/2011 1345      Component Value Date/Time   CALCIUM 9.6 10/27/2013 0957   CALCIUM 9.5 01/06/2013 0920   CALCIUM 8.7 11/09/2011 1345   ALKPHOS 57 10/27/2013 0957   ALKPHOS 60 01/06/2013 0920   ALKPHOS 52 11/09/2011 1345   AST 24 10/27/2013 0957   AST 28 01/06/2013 0920   AST 20 11/09/2011 1345    ALT 20 10/27/2013 0957   ALT 20 01/06/2013 0920   ALT 21 11/09/2011 1345   BILITOT 1.11 10/27/2013 0957   BILITOT 1.2 01/06/2013 0920   BILITOT 0.60 11/09/2011 1345       RADIOGRAPHIC STUDIES:   ASSESSMENT AND PLAN: This is a very pleasant 78 years old white female with history of stage IIIa non-small cell lung cancer status post concurrent chemoradiation and has been on treatment with Tarceva for the  last 55 months with no significant evidence for disease progression. The patient is doing fine and tolerating her treatment fairly well. I recommended for her to continue treatment with Tarceva with the same dose 100 mg by mouth daily. I would see her back for followup visit in one month with repeat CT scan of the chest. She was advised to call immediately if she has any concerning symptoms in the interval.  The patient voices understanding of current disease status and treatment options and is in agreement with the current care plan.  All questions were answered. The patient knows to call the clinic with any problems, questions or concerns. We can certainly see the patient much sooner if necessary.

## 2013-11-24 NOTE — Patient Instructions (Signed)
CURRENT THERAPY: Tarceva at 100 mg p.o. Daily, started March 19 2013, status post 7 months of treatment.  CHEMOTHERAPY INTENT: Maintenance.  CURRENT # OF CHEMOTHERAPY CYCLES: 56  CURRENT ANTIEMETICS: Compazine  CURRENT SMOKING STATUS: Never smoker  ORAL CHEMOTHERAPY AND CONSENT: Tarceva  CURRENT BISPHOSPHONATES USE: None  PAIN MANAGEMENT: 5/10 low back pain and sciatica currently on naproxen.  NARCOTICS INDUCED CONSTIPATION: None  LIVING WILL AND CODE STATUS: Full code but no long-term resuscitation.

## 2013-12-04 ENCOUNTER — Other Ambulatory Visit: Payer: Self-pay | Admitting: Dermatology

## 2013-12-28 ENCOUNTER — Ambulatory Visit (HOSPITAL_COMMUNITY)
Admission: RE | Admit: 2013-12-28 | Discharge: 2013-12-28 | Disposition: A | Discharge status: Home / Self Care | Payer: Medicare Other | Source: Ambulatory Visit | Attending: Internal Medicine | Admitting: Internal Medicine

## 2013-12-28 ENCOUNTER — Encounter (HOSPITAL_COMMUNITY): Payer: Self-pay

## 2013-12-28 ENCOUNTER — Other Ambulatory Visit: Payer: Medicare Other | Admitting: Lab

## 2013-12-28 ENCOUNTER — Other Ambulatory Visit (HOSPITAL_BASED_OUTPATIENT_CLINIC_OR_DEPARTMENT_OTHER): Payer: Medicare Other

## 2013-12-28 DIAGNOSIS — C3490 Malignant neoplasm of unspecified part of unspecified bronchus or lung: Secondary | ICD-10-CM

## 2013-12-28 DIAGNOSIS — Y842 Radiological procedure and radiotherapy as the cause of abnormal reaction of the patient, or of later complication, without mention of misadventure at the time of the procedure: Secondary | ICD-10-CM | POA: Insufficient documentation

## 2013-12-28 DIAGNOSIS — J90 Pleural effusion, not elsewhere classified: Secondary | ICD-10-CM | POA: Insufficient documentation

## 2013-12-28 DIAGNOSIS — J701 Chronic and other pulmonary manifestations due to radiation: Secondary | ICD-10-CM | POA: Insufficient documentation

## 2013-12-28 DIAGNOSIS — I2510 Atherosclerotic heart disease of native coronary artery without angina pectoris: Secondary | ICD-10-CM | POA: Insufficient documentation

## 2013-12-28 DIAGNOSIS — K7460 Unspecified cirrhosis of liver: Secondary | ICD-10-CM | POA: Insufficient documentation

## 2013-12-28 DIAGNOSIS — C342 Malignant neoplasm of middle lobe, bronchus or lung: Secondary | ICD-10-CM

## 2013-12-28 DIAGNOSIS — T66XXXS Radiation sickness, unspecified, sequela: Secondary | ICD-10-CM | POA: Insufficient documentation

## 2013-12-28 DIAGNOSIS — Z9221 Personal history of antineoplastic chemotherapy: Secondary | ICD-10-CM | POA: Insufficient documentation

## 2013-12-28 LAB — COMPREHENSIVE METABOLIC PANEL (CC13)
ALT: 21 U/L (ref 0–55)
ANION GAP: 11 meq/L (ref 3–11)
AST: 22 U/L (ref 5–34)
Albumin: 3.4 g/dL — ABNORMAL LOW (ref 3.5–5.0)
Alkaline Phosphatase: 62 U/L (ref 40–150)
BUN: 22.2 mg/dL (ref 7.0–26.0)
CALCIUM: 9.5 mg/dL (ref 8.4–10.4)
CHLORIDE: 107 meq/L (ref 98–109)
CO2: 26 meq/L (ref 22–29)
CREATININE: 0.8 mg/dL (ref 0.6–1.1)
GLUCOSE: 95 mg/dL (ref 70–140)
Potassium: 4.4 mEq/L (ref 3.5–5.1)
Sodium: 143 mEq/L (ref 136–145)
Total Bilirubin: 0.8 mg/dL (ref 0.20–1.20)
Total Protein: 6.2 g/dL — ABNORMAL LOW (ref 6.4–8.3)

## 2013-12-28 LAB — CBC WITH DIFFERENTIAL/PLATELET
BASO%: 0.5 % (ref 0.0–2.0)
BASOS ABS: 0 10*3/uL (ref 0.0–0.1)
EOS ABS: 0.3 10*3/uL (ref 0.0–0.5)
EOS%: 5.1 % (ref 0.0–7.0)
HEMATOCRIT: 39.6 % (ref 34.8–46.6)
HEMOGLOBIN: 13.3 g/dL (ref 11.6–15.9)
LYMPH#: 0.7 10*3/uL — AB (ref 0.9–3.3)
LYMPH%: 11.9 % — ABNORMAL LOW (ref 14.0–49.7)
MCH: 31.2 pg (ref 25.1–34.0)
MCHC: 33.5 g/dL (ref 31.5–36.0)
MCV: 93.2 fL (ref 79.5–101.0)
MONO#: 0.5 10*3/uL (ref 0.1–0.9)
MONO%: 7.9 % (ref 0.0–14.0)
NEUT%: 74.6 % (ref 38.4–76.8)
NEUTROS ABS: 4.5 10*3/uL (ref 1.5–6.5)
Platelets: 270 10*3/uL (ref 145–400)
RBC: 4.25 10*6/uL (ref 3.70–5.45)
RDW: 14.1 % (ref 11.2–14.5)
WBC: 6.1 10*3/uL (ref 3.9–10.3)

## 2013-12-28 MED ORDER — IOHEXOL 300 MG/ML  SOLN
80.0000 mL | Freq: Once | INTRAMUSCULAR | Status: AC | PRN
  Administered 2013-12-28: 80 mL via INTRAVENOUS

## 2013-12-30 ENCOUNTER — Telehealth: Payer: Self-pay | Admitting: Internal Medicine

## 2013-12-30 ENCOUNTER — Encounter: Payer: Self-pay | Admitting: Internal Medicine

## 2013-12-30 ENCOUNTER — Ambulatory Visit (HOSPITAL_BASED_OUTPATIENT_CLINIC_OR_DEPARTMENT_OTHER): Payer: Medicare Other | Admitting: Internal Medicine

## 2013-12-30 VITALS — BP 117/71 | HR 85 | Temp 97.8°F | Resp 18 | Ht 63.0 in | Wt 130.4 lb

## 2013-12-30 DIAGNOSIS — C342 Malignant neoplasm of middle lobe, bronchus or lung: Secondary | ICD-10-CM

## 2013-12-30 DIAGNOSIS — C3490 Malignant neoplasm of unspecified part of unspecified bronchus or lung: Secondary | ICD-10-CM

## 2013-12-30 NOTE — Patient Instructions (Signed)
CURRENT THERAPY: Tarceva at 100 mg p.o. Daily, started March 19 2013, status post 8 months of treatment.   CHEMOTHERAPY INTENT: Maintenance.  CURRENT # OF CHEMOTHERAPY CYCLES: 57 CURRENT ANTIEMETICS: Compazine  CURRENT SMOKING STATUS: Never smoker  ORAL CHEMOTHERAPY AND CONSENT: Tarceva  CURRENT BISPHOSPHONATES USE: None  PAIN MANAGEMENT: 5/10 low back pain and sciatica currently on naproxen.  NARCOTICS INDUCED CONSTIPATION: None  LIVING WILL AND CODE STATUS: Full code but no long-term resuscitation.

## 2013-12-30 NOTE — Progress Notes (Signed)
[place] [phone]   Fax:(336) [place], [place] [address]  DIAGNOSIS AND STAGE: : Stage IIIA nonsmall cell lung cancer, adenocarcinoma, in a never smoker patient with positive EGFR mutation diagnosed in January 2010.   PRIOR THERAPY:  1) Status post concurrent chemoradiation with weekly carboplatin and paclitaxel; last dose given March 21, 2009.  2) Tarceva at 150 mg p.o. daily, status post approximately 48 months of treatment, discontinued secondary to persistent diarrhea.   CURRENT THERAPY: Tarceva at 100 mg p.o. Daily, started March 19 2013, status post 8 months of treatment.   CHEMOTHERAPY INTENT: Maintenance.  CURRENT # OF CHEMOTHERAPY CYCLES: 57 CURRENT ANTIEMETICS: Compazine  CURRENT SMOKING STATUS: Never smoker  ORAL CHEMOTHERAPY AND CONSENT: Tarceva  CURRENT BISPHOSPHONATES USE: None  PAIN MANAGEMENT: 5/10 low back pain and sciatica currently on naproxen.  NARCOTICS INDUCED CONSTIPATION: None  LIVING WILL AND CODE STATUS: Full code but no long-term resuscitation.   INTERVAL HISTORY: INDIANNA BORAN 79 y.o. female returns to the clinic today for followup visit. She denied having any significant fatigue or weakness. The patient denied having any significant weight loss or night sweats. She has no chest pain, shortness of breath, cough or hemoptysis. The patient is tolerating her current treatment with Tarceva 100 mg by mouth daily fairly well with no significant adverse effects. She denied having any significant skin rash or diarrhea. She had repeat CT scan of the chest performed recently and she is here for evaluation and discussion of her scan results.  MEDICAL HISTORY: Past Medical History  Diagnosis Date  . Hemorrhoid   . COPD (chronic obstructive pulmonary disease)   . Shortness of breath   . GERD (gastroesophageal reflux disease)   . Arthritis   . Cancer     lung,  currently being treated with Oral med    ALLERGIES:  is allergic to amoxicillin and doxycycline.  MEDICATIONS:  Current Outpatient Prescriptions  Medication Sig Dispense Refill  . aspirin EC 81 MG tablet Take 81 mg by mouth daily.      . calcium carbonate (OS-CAL) 600 MG TABS Take 600 mg by mouth 2 (two) times daily with a meal.        . cholecalciferol (VITAMIN D) 1000 UNITS tablet Take 2,000 Units by mouth 2 (two) times daily.      Marland Kitchen co-enzyme Q-10 30 MG capsule Take 30 mg by mouth daily.      Marland Kitchen conjugated estrogens (PREMARIN) vaginal cream Place vaginally once a week.        . Cyanocobalamin (B-12) 1000 MCG CAPS Take 1,000 mcg by mouth 3 (three) times daily.       Marland Kitchen erlotinib (TARCEVA) 100 MG tablet Take 1 tablet (100 mg total) by mouth daily. Take on an empty stomach 1 hour before meals or 2 hours after  30 tablet  2  . loperamide (IMODIUM A-D) 2 MG tablet Take 2 mg by mouth 4 (four) times daily as needed. diarrhea      . loratadine (CLARITIN) 10 MG tablet Take 10 mg by mouth daily.        Marland Kitchen LORazepam (ATIVAN) 0.5 MG tablet Take 1 tablet (0.5 mg total) by mouth every 8 (eight) hours as needed.  30 tablet  0  . Multiple Vitamin (MULTIVITAMIN) tablet Take 1 tablet by mouth daily.        . naproxen (NAPROSYN) 250 MG tablet Take 250  mg by mouth 2 (two) times daily with a meal.      . omeprazole (PRILOSEC) 10 MG capsule Take 20 mg by mouth daily.       Vladimir Faster Glycol-Propyl Glycol (SYSTANE OP) Place 1 drop into both eyes as needed. At night for dry eyes      . tiotropium (SPIRIVA) 18 MCG inhalation capsule Place 18 mcg into inhaler and inhale daily.         No current facility-administered medications for this visit.    SURGICAL HISTORY:  Past Surgical History  Procedure Laterality Date  . Breast surgery  02/1999    left nipple discharge/ Central duct excision  . Anal fissure repair  07/09/2011  . Knee arthroscopy      R  . Abdominal hysterectomy    . Thumb arthroscopy      L -  removed bone spur  . Tonsillectomy    . Appendectomy    . US echocardiography    . Thoracoscopy      w/lung biopsy  . Cardiovascular stress test      REVIEW OF SYSTEMS:  Constitutional: negative Eyes: negative Ears, nose, mouth, throat, and face: negative Respiratory: negative Cardiovascular: negative Gastrointestinal: negative Genitourinary:negative Integument/breast: negative Hematologic/lymphatic: negative Musculoskeletal:negative Neurological: negative Behavioral/Psych: negative Endocrine: negative Allergic/Immunologic: negative   PHYSICAL EXAMINATION: General appearance: alert, cooperative and no distress Head: Normocephalic, without obvious abnormality, atraumatic Neck: no adenopathy, no JVD, supple, symmetrical, trachea midline and thyroid not enlarged, symmetric, no tenderness/mass/nodules Lymph nodes: Cervical, supraclavicular, and axillary nodes normal. Resp: clear to auscultation bilaterally Cardio: regular rate and rhythm, S1, S2 normal, no murmur, click, rub or gallop GI: soft, non-tender; bowel sounds normal; no masses,  no organomegaly Extremities: extremities normal, atraumatic, no cyanosis or edema Neurologic: Alert and oriented X 3, normal strength and tone. Normal symmetric reflexes. Normal coordination and gait  ECOG PERFORMANCE STATUS: 1 - Symptomatic but completely ambulatory  Blood pressure 117/71, pulse 85, temperature 97.8 F (36.6 C), temperature source Oral, resp. rate 18, height $RemoveBe'5\' 3"'ebNSIOWLu$  (1.6 m), weight 130 lb 6.4 oz (59.149 kg), SpO2 100.00%.  LABORATORY DATA: Lab Results  Component Value Date   WBC 6.1 12/28/2013   HGB 13.3 12/28/2013   HCT 39.6 12/28/2013   MCV 93.2 12/28/2013   PLT 270 12/28/2013      Chemistry      Component Value Date/Time   NA 143 12/28/2013 1150   NA 140 01/06/2013 0920   NA 145 11/09/2011 1345   K 4.4 12/28/2013 1150   K 3.2* 01/06/2013 0920   K 3.9 11/09/2011 1345   CL 108* 06/03/2013 0939   CL 102 01/06/2013 0920   CL 104  11/09/2011 1345   CO2 26 12/28/2013 1150   CO2 27 01/06/2013 0920   CO2 28 11/09/2011 1345   BUN 22.2 12/28/2013 1150   BUN 16 01/06/2013 0920   BUN 20 11/09/2011 1345   CREATININE 0.8 12/28/2013 1150   CREATININE 0.92 01/06/2013 0920   CREATININE 0.6 11/09/2011 1345      Component Value Date/Time   CALCIUM 9.5 12/28/2013 1150   CALCIUM 9.5 01/06/2013 0920   CALCIUM 8.7 11/09/2011 1345   ALKPHOS 62 12/28/2013 1150   ALKPHOS 60 01/06/2013 0920   ALKPHOS 52 11/09/2011 1345   AST 22 12/28/2013 1150   AST 28 01/06/2013 0920   AST 20 11/09/2011 1345   ALT 21 12/28/2013 1150   ALT 20 01/06/2013 0920   ALT 21 11/09/2011 1345  BILITOT 0.80 12/28/2013 1150   BILITOT 1.2 01/06/2013 0920   BILITOT 0.60 11/09/2011 1345       RADIOGRAPHIC STUDIES: Ct Chest W Contrast  12/28/2013   CLINICAL DATA:  Lung cancer restaging, chemotherapy complete.  EXAM: CT CHEST WITH CONTRAST  TECHNIQUE: Multidetector CT imaging of the chest was performed during intravenous contrast administration.  CONTRAST:  32mL OMNIPAQUE IOHEXOL 300 MG/ML  SOLN  COMPARISON:  09/25/2013 and 07/01/2013.  FINDINGS: No pathologically enlarged mediastinal, hilar or axillary lymph nodes. Atherosclerotic calcification of the arterial vasculature, including coronary arteries. Heart size normal. No pericardial effusion.  Moderate right pleural effusion, stable or possibly minimally decreased. Radiation fibrosis and volume loss in the medial aspect of the right hemi thorax. Scattered vague pulmonary nodular densities are unchanged. Airway is otherwise unremarkable.  Incidental imaging of the upper abdomen shows no acute findings. There is slight irregularity of the ventral margin of the left hepatic lobe. No worrisome lytic or sclerotic lesions. Degenerative changes are seen in the spine.  IMPRESSION: 1. Radiation fibrosis and volume loss in the medial right hemi thorax without evidence of recurrent or metastatic disease. 2. Moderate right pleural effusion,  possibly minimally decreased from 09/25/2013. 3. Slight marginal irregularity of the left hepatic lobe can be seen with early/mild cirrhosis.   Electronically Signed   By: Lorin Picket M.D.   On: 12/28/2013 13:38    ASSESSMENT AND PLAN: This is a very pleasant 80 years old white female with history of stage IIIa non-small cell lung cancer status post concurrent chemoradiation and has been on treatment with Tarceva for the last 56 months with no significant evidence for disease progression. The recent scan showed no evidence for recurrent or metastatic disease. I discussed the scan results with the patient today. I recommended for her to continue her current treatment with Tarceva 100 mg by mouth daily. The patient is doing fine and tolerating her treatment fairly well. She was advised to call immediately if she has any concerning symptoms in the interval.  The patient voices understanding of current disease status and treatment options and is in agreement with the current care plan.  All questions were answered. The patient knows to call the clinic with any problems, questions or concerns. We can certainly see the patient much sooner if necessary. I had 15 minutes of face-to-face counseling with the patient of a total visit time 25 minutes.

## 2013-12-30 NOTE — Telephone Encounter (Signed)
Gave pt appt for lab and Md for February 2015

## 2014-01-28 ENCOUNTER — Other Ambulatory Visit: Payer: Self-pay | Admitting: *Deleted

## 2014-01-28 NOTE — Telephone Encounter (Signed)
THIS REFILL REQUEST FOR TARCEVA WAS PLACED ON DR.MOHAMED'S DESK.

## 2014-01-29 ENCOUNTER — Other Ambulatory Visit: Payer: Self-pay | Admitting: Medical Oncology

## 2014-01-29 DIAGNOSIS — C3490 Malignant neoplasm of unspecified part of unspecified bronchus or lung: Secondary | ICD-10-CM

## 2014-01-29 MED ORDER — ERLOTINIB HCL 100 MG PO TABS: 100.0000 mg | ORAL_TABLET | Freq: Every day | ORAL | Status: DC

## 2014-01-29 NOTE — Telephone Encounter (Signed)
RECEIVED A FAX FROM [employer] CONCERNING A CONFIRMATION OF FACSIMILE RECEIPT FOR PT. REFERRAL. 

## 2014-02-01 ENCOUNTER — Other Ambulatory Visit (HOSPITAL_BASED_OUTPATIENT_CLINIC_OR_DEPARTMENT_OTHER): Payer: Medicare Other

## 2014-02-01 ENCOUNTER — Ambulatory Visit (HOSPITAL_BASED_OUTPATIENT_CLINIC_OR_DEPARTMENT_OTHER): Payer: Medicare Other | Admitting: Physician Assistant

## 2014-02-01 ENCOUNTER — Encounter: Payer: Self-pay | Admitting: Physician Assistant

## 2014-02-01 ENCOUNTER — Telehealth: Payer: Self-pay | Admitting: Internal Medicine

## 2014-02-01 VITALS — BP 121/71 | HR 91 | Temp 97.7°F | Resp 18 | Ht 63.0 in | Wt 128.7 lb

## 2014-02-01 DIAGNOSIS — C3490 Malignant neoplasm of unspecified part of unspecified bronchus or lung: Secondary | ICD-10-CM

## 2014-02-01 DIAGNOSIS — C342 Malignant neoplasm of middle lobe, bronchus or lung: Secondary | ICD-10-CM

## 2014-02-01 LAB — CBC WITH DIFFERENTIAL/PLATELET
BASO%: 0.4 % (ref 0.0–2.0)
Basophils Absolute: 0 10*3/uL (ref 0.0–0.1)
EOS%: 4.2 % (ref 0.0–7.0)
Eosinophils Absolute: 0.2 10*3/uL (ref 0.0–0.5)
HCT: 39 % (ref 34.8–46.6)
HEMOGLOBIN: 12.9 g/dL (ref 11.6–15.9)
LYMPH#: 0.7 10*3/uL — AB (ref 0.9–3.3)
LYMPH%: 11.9 % — ABNORMAL LOW (ref 14.0–49.7)
MCH: 30.1 pg (ref 25.1–34.0)
MCHC: 33.1 g/dL (ref 31.5–36.0)
MCV: 90.9 fL (ref 79.5–101.0)
MONO#: 0.4 10*3/uL (ref 0.1–0.9)
MONO%: 6.8 % (ref 0.0–14.0)
NEUT#: 4.2 10*3/uL (ref 1.5–6.5)
NEUT%: 76.7 % (ref 38.4–76.8)
Platelets: 277 10*3/uL (ref 145–400)
RBC: 4.29 10*6/uL (ref 3.70–5.45)
RDW: 14 % (ref 11.2–14.5)
WBC: 5.5 10*3/uL (ref 3.9–10.3)

## 2014-02-01 LAB — COMPREHENSIVE METABOLIC PANEL (CC13)
ALT: 18 U/L (ref 0–55)
AST: 21 U/L (ref 5–34)
Albumin: 3.5 g/dL (ref 3.5–5.0)
Alkaline Phosphatase: 59 U/L (ref 40–150)
Anion Gap: 8 mEq/L (ref 3–11)
BILIRUBIN TOTAL: 0.82 mg/dL (ref 0.20–1.20)
BUN: 19.6 mg/dL (ref 7.0–26.0)
CO2: 28 mEq/L (ref 22–29)
CREATININE: 0.8 mg/dL (ref 0.6–1.1)
Calcium: 9.5 mg/dL (ref 8.4–10.4)
Chloride: 107 mEq/L (ref 98–109)
GLUCOSE: 90 mg/dL (ref 70–140)
Potassium: 4.1 mEq/L (ref 3.5–5.1)
SODIUM: 144 meq/L (ref 136–145)
Total Protein: 6.1 g/dL — ABNORMAL LOW (ref 6.4–8.3)

## 2014-02-01 MED ORDER — LORAZEPAM 0.5 MG PO TABS: 0.5000 mg | ORAL_TABLET | Freq: Three times a day (TID) | ORAL | Status: DC | PRN

## 2014-02-01 NOTE — Patient Instructions (Signed)
Continue taking Tarceva 100 mg by mouth daily Followup in one month

## 2014-02-01 NOTE — Telephone Encounter (Signed)
gv and printed appt sched anda vs for pt for March.... °

## 2014-02-01 NOTE — Progress Notes (Addendum)
    [facility] [phone]   [phone]  SHARED VISIT PROGRESS NOTE  [name],[name], MD [address] [place] [address]  DIAGNOSIS AND STAGE: : Stage IIIB nonsmall cell lung cancer, adenocarcinoma, in a never smoker [name] with positive EGFR mutation diagnosed in January 2010.   PRIOR THERAPY:  1) Status post concurrent chemoradiation with weekly carboplatin and paclitaxel; last dose given March 21, 2009.  2) Tarceva at 150 mg p.o. daily, status post approximately 48 months of treatment, discontinued secondary to persistent diarrhea.   CURRENT THERAPY: Tarceva at 100 mg p.o. Daily, started March 19 2013, status post 9 months of treatment.   CHEMOTHERAPY INTENT: Maintenance.  CURRENT # OF CHEMOTHERAPY CYCLES: 58 CURRENT ANTIEMETICS: Compazine  CURRENT SMOKING STATUS: Never smoker  ORAL CHEMOTHERAPY AND CONSENT: Tarceva  CURRENT BISPHOSPHONATES USE: None  PAIN MANAGEMENT: 5/10 low back pain and sciatica currently on naproxen.  NARCOTICS INDUCED CONSTIPATION: None  LIVING WILL AND CODE STATUS: Full code but no long-term resuscitation.   INTERVAL HISTORY: Dana [name] returns to [facility] clinic today for followup visit. Dana [name] denied having any significant fatigue or weakness. Dana [name] denied having any significant weight loss or night sweats. Dana [name] has no chest pain, shortness of breath, cough or hemoptysis. Dana [name] is tolerating Dana [name] current treatment with Tarceva 100 mg by mouth daily fairly well with no significant adverse effects. Dana [name] denied having any significant skin rash or diarrhea. Dana [name] continues to walk a mile daily as well as participate in water aerobics. Dana [name] requests a refill for Dana [name] Ativan tablets.  MEDICAL HISTORY: Past Medical History  Diagnosis Date  . Hemorrhoid   . COPD (chronic obstructive pulmonary disease)   . Shortness of breath   . GERD (gastroesophageal reflux disease)   . Arthritis   . Cancer      lung, currently being treated with Oral med    ALLERGIES:  is allergic to amoxicillin and doxycycline.  MEDICATIONS:  Current Outpatient Prescriptions  Medication Sig Dispense Refill  . aspirin EC 81 MG tablet Take 81 mg by mouth daily.      . calcium carbonate (OS-CAL) 600 MG TABS Take 600 mg by mouth 2 (two) times daily with a meal.        . cholecalciferol (VITAMIN D) 1000 UNITS tablet Take 2,000 Units by mouth 2 (two) times daily.      . co-enzyme Q-10 30 MG capsule Take 30 mg by mouth daily.      . conjugated estrogens (PREMARIN) vaginal cream Place vaginally once a week.        . Cyanocobalamin (B-12) 1000 MCG CAPS Take 1,000 mcg by mouth 3 (three) times daily.       . [START ON 02/02/2014] erlotinib (TARCEVA) 100 MG tablet Take 1 tablet (100 mg total) by mouth daily. Take on an empty stomach 1 hour before meals or 2 hours after  30 tablet  2  . loperamide (IMODIUM A-D) 2 MG tablet Take 2 mg by mouth 4 (four) times daily as needed. diarrhea      . loratadine (CLARITIN) 10 MG tablet Take 10 mg by mouth daily.        . LORazepam (ATIVAN) 0.5 MG tablet Take 1 tablet (0.5 mg total) by mouth every 8 (eight) hours as needed.  30 tablet  0  . naproxen (NAPROSYN) 250 MG tablet Take 250 mg by mouth 2 (two) times daily with a meal.      .   omeprazole (PRILOSEC) 10 MG capsule Take 20 mg by mouth daily.       . Polyethyl Glycol-Propyl Glycol (SYSTANE OP) Place 1 drop into both eyes as needed. At night for dry eyes      . tiotropium (SPIRIVA) 18 MCG inhalation capsule Place 18 mcg into inhaler and inhale daily.        . Multiple Vitamin (MULTIVITAMIN) tablet Take 1 tablet by mouth daily.         No current facility-administered medications for this visit.    SURGICAL HISTORY:  Past Surgical History  Procedure Laterality Date  . Breast surgery  02/1999    left nipple discharge/ Central duct excision  . Anal fissure repair  07/09/2011  . Knee arthroscopy      R  . Abdominal hysterectomy    .  Thumb arthroscopy      L - removed bone spur  . Tonsillectomy    . Appendectomy    . Us echocardiography    . Thoracoscopy      w/lung biopsy  . Cardiovascular stress test      REVIEW OF SYSTEMS:  Constitutional: negative Eyes: negative Ears, nose, mouth, throat, and face: negative Respiratory: negative Cardiovascular: negative Gastrointestinal: negative Genitourinary:negative Integument/breast: negative Hematologic/lymphatic: negative Musculoskeletal:negative Neurological: negative Behavioral/Psych: negative Endocrine: negative Allergic/Immunologic: negative   PHYSICAL EXAMINATION: General appearance: alert, cooperative and no distress Head: Normocephalic, without obvious abnormality, atraumatic Neck: no adenopathy, no JVD, supple, symmetrical, trachea midline and thyroid not enlarged, symmetric, no tenderness/mass/nodules Lymph nodes: Cervical, supraclavicular, and axillary nodes normal. Resp: clear to auscultation bilaterally Cardio: regular rate and rhythm, S1, S2 normal, no murmur, click, rub or gallop GI: soft, non-tender; bowel sounds normal; no masses,  no organomegaly Extremities: extremities normal, atraumatic, no cyanosis or edema Neurologic: Alert and oriented X 3, normal strength and tone. Normal symmetric reflexes. Normal coordination and gait  ECOG PERFORMANCE STATUS: 1 - Symptomatic but completely ambulatory  Blood pressure 121/71, pulse 91, temperature 97.7 F (36.5 C), temperature source Oral, resp. rate 18, height 5' 3" (1.6 m), weight 128 lb 11.2 oz (58.378 kg), SpO2 98.00%.  LABORATORY DATA: Lab Results  Component Value Date   WBC 5.5 02/01/2014   HGB 12.9 02/01/2014   HCT 39.0 02/01/2014   MCV 90.9 02/01/2014   PLT 277 02/01/2014      Chemistry      Component Value Date/Time   NA 144 02/01/2014 1013   NA 140 01/06/2013 0920   NA 145 11/09/2011 1345   K 4.1 02/01/2014 1013   K 3.2* 01/06/2013 0920   K 3.9 11/09/2011 1345   CL 108* 06/03/2013 0939   CL  102 01/06/2013 0920   CL 104 11/09/2011 1345   CO2 28 02/01/2014 1013   CO2 27 01/06/2013 0920   CO2 28 11/09/2011 1345   BUN 19.6 02/01/2014 1013   BUN 16 01/06/2013 0920   BUN 20 11/09/2011 1345   CREATININE 0.8 02/01/2014 1013   CREATININE 0.92 01/06/2013 0920   CREATININE 0.6 11/09/2011 1345      Component Value Date/Time   CALCIUM 9.5 02/01/2014 1013   CALCIUM 9.5 01/06/2013 0920   CALCIUM 8.7 11/09/2011 1345   ALKPHOS 59 02/01/2014 1013   ALKPHOS 60 01/06/2013 0920   ALKPHOS 52 11/09/2011 1345   AST 21 02/01/2014 1013   AST 28 01/06/2013 0920   AST 20 11/09/2011 1345   ALT 18 02/01/2014 1013   ALT 20 01/06/2013 0920   ALT 21 11/09/2011 1345     BILITOT 0.82 02/01/2014 1013   BILITOT 1.2 01/06/2013 0920   BILITOT 0.60 11/09/2011 1345       RADIOGRAPHIC STUDIES: Ct Chest W Contrast  12/28/2013   CLINICAL DATA:  Lung cancer restaging, chemotherapy complete.  EXAM: CT CHEST WITH CONTRAST  TECHNIQUE: Multidetector CT imaging of Dana chest was performed during intravenous contrast administration.  CONTRAST:  80mL OMNIPAQUE IOHEXOL 300 MG/ML  SOLN  COMPARISON:  09/25/2013 and 07/01/2013.  FINDINGS: No pathologically enlarged mediastinal, hilar or axillary lymph nodes. Atherosclerotic calcification of Dana arterial vasculature, including coronary arteries. Heart size normal. No pericardial effusion.  Moderate right pleural effusion, stable or possibly minimally decreased. Radiation fibrosis and volume loss in Dana medial aspect of Dana right hemi thorax. Scattered vague pulmonary nodular densities are unchanged. Airway is otherwise unremarkable.  Incidental imaging of Dana upper abdomen shows no acute findings. There is slight irregularity of Dana ventral margin of Dana left hepatic lobe. No worrisome lytic or sclerotic lesions. Degenerative changes are seen in Dana spine.  IMPRESSION: 1. Radiation fibrosis and volume loss in Dana medial right hemi thorax without evidence of recurrent or metastatic disease. 2. Moderate  right pleural effusion, possibly minimally decreased from 09/25/2013. 3. Slight marginal irregularity of Dana left hepatic lobe can be seen with early/mild cirrhosis.   Electronically Signed   By: Melinda  Blietz M.D.   On: 12/28/2013 13:38    ASSESSMENT AND PLAN: This is a very pleasant 78 years old white [name] with history of stage IIIa non-small cell lung cancer status post concurrent chemoradiation and has been on treatment with Tarceva for Dana last 57 months with no significant evidence for disease progression. Dana recent scan showed no evidence for recurrent or metastatic disease. Dana [name] was discussed with and also seen by Dr. Mohamed. Dana [name] will continue on Tarceva at 100 mg by mouth daily. Dana [name]'ll return in one month for another symptom management visit with repeat CBC differential and C. met. Dana [name] was given a refill prescription for Dana [name] Ativan tablets as requested.  Dana [name] is doing fine and tolerating Dana [name] treatment fairly well. Dana [name] was advised to call immediately if Dana [name] has any concerning symptoms in Dana interval.  Dana [name] of current disease status and treatment options and is in agreement with Dana current care plan.  All questions were answered. Dana [name] knows to call Dana clinic with any problems, questions or concerns. We can certainly see Dana [name] much sooner if necessary.  JOHNSON, ADRENA E, PA-C  ADDENDUM: Hematology/Oncology Attending: I had Dana face to face encounter with Dana [name] today. I recommended Dana [name] care plan. This is a very pleasant 79 years old white [name] with stage IIIB non-small cell lung cancer currently undergoing treatment with Tarceva 100 mg by mouth daily and tolerating Dana [name] treatment fairly well except for occasional episodes of diarrhea as well as dry skin. Dana [name] denied having any other significant complaints today. I recommended for Dana [name] to continue with Dana [name] current treatment with Tarceva. Dana [name] would come back for follow  up visit in one month's for reevaluation and management any adverse effect of Dana [name] treatment. Dana [name] was advised to call immediately if Dana [name] has any concerning symptoms in Dana interval.  Disclaimer: This note was dictated with voice recognition software. Similar sounding words can inadvertently be transcribed and may not be corrected upon review.  MOHAMED,MOHAMED K., MD 02/01/2014      

## 2014-02-02 NOTE — Telephone Encounter (Signed)
RECEIVED A FAX FROM [facility] CONCERNING A CONFIRMATION OF PRESCRIPTION SHIPMENT FOR [facility] ON 02/01/14.

## 2014-02-03 ENCOUNTER — Encounter: Payer: Self-pay | Admitting: Infectious Diseases

## 2014-02-03 ENCOUNTER — Ambulatory Visit (INDEPENDENT_AMBULATORY_CARE_PROVIDER_SITE_OTHER): Payer: Medicare Other | Admitting: Infectious Diseases

## 2014-02-03 VITALS — BP 144/84 | HR 91 | Temp 98.0°F | Ht 64.0 in | Wt 128.0 lb

## 2014-02-03 DIAGNOSIS — L03113 Cellulitis of right upper limb: Secondary | ICD-10-CM

## 2014-02-03 DIAGNOSIS — IMO0002 Reserved for concepts with insufficient information to code with codable children: Secondary | ICD-10-CM

## 2014-02-03 MED ORDER — DOXYCYCLINE HYCLATE 100 MG PO TABS: 100.0000 mg | ORAL_TABLET | Freq: Two times a day (BID) | ORAL | Status: DC

## 2014-02-03 NOTE — Assessment & Plan Note (Signed)
Her exam and her hx of gardening suggest erysipilothrix. She has transient improvement with doxy prior. Will give her a prolonged course. Will check BCx to assess her risk for IE. Other bacteria that could cause lesions like this would be mycobacteria or CSD. Will see her back in 3 weeks.

## 2014-02-03 NOTE — Progress Notes (Signed)
   Subjective:    Patient ID: Dana [name], female    DOB: 04/07/1935, 79 y.o.   MRN: [id number]  HPI 79 yo F wth hx of Lung Ca (dx 2010), and R wrist swelling. In 2012 she underwent debridement of her wrist which showed necrotizing granulomas. She has since developed lesions on her R arm  and for last year has had irritation of R forearm. Has ben migratory/expanding. Looks best today that it has in some time (usually peeling profusely). Usually tender as well. Has been applying nothing to it recently, no lotions.  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 All PAS and fungal stains have been (-).   Previously got C diff from doxy, amoxicillin.  Was on anbx previously (1 year ago), doxy. She had transient imprvoement with this.  Sochx/Fhx reviewed.   No fish, does water aerobics, no pets, no raw fish or oyesters/ has not been to beach in 3 years, does occas gardening (occas gets pricked with thorns).   Review of Systems  Constitutional: Negative for fever, chills, appetite change and unexpected weight change.  Respiratory:       DOE  Gastrointestinal: Negative for diarrhea and constipation.  Genitourinary: Negative for difficulty urinating.  Hematological: Negative for adenopathy.       Objective:   Physical Exam  Constitutional: She appears well-developed and well-nourished.  HENT:  Mouth/Throat: No oropharyngeal exudate.  Eyes: EOM are normal. Pupils are equal, round, and reactive to light.  Neck: Neck supple.  Cardiovascular: Normal rate, regular rhythm and normal heart sounds.   Pulmonary/Chest: Effort normal and breath sounds normal.  Abdominal: Soft. Bowel sounds are normal. She exhibits no distension. There is no tenderness.  Lymphadenopathy:    She has no cervical adenopathy.    She has no axillary adenopathy.  Skin:             Assessment & Plan:

## 2014-02-09 LAB — CULTURE, BLOOD (SINGLE)
Organism ID, Bacteria: NO GROWTH
Organism ID, Bacteria: NO GROWTH

## 2014-02-24 ENCOUNTER — Encounter: Payer: Self-pay | Admitting: Infectious Diseases

## 2014-02-24 ENCOUNTER — Ambulatory Visit (INDEPENDENT_AMBULATORY_CARE_PROVIDER_SITE_OTHER): Payer: Medicare Other | Admitting: Infectious Diseases

## 2014-02-24 VITALS — BP 135/85 | HR 81 | Temp 97.7°F | Ht 64.0 in | Wt 131.0 lb

## 2014-02-24 DIAGNOSIS — IMO0002 Reserved for concepts with insufficient information to code with codable children: Secondary | ICD-10-CM

## 2014-02-24 DIAGNOSIS — L03113 Cellulitis of right upper limb: Secondary | ICD-10-CM

## 2014-02-24 NOTE — Progress Notes (Signed)
   Subjective:    Patient ID: Dana [name], female    DOB: [birth year], 79 y.o.   MRN: [id number]  HPI 79 yo F wth hx of Lung Ca (dx 2010), and R wrist swelling. In 2012 she underwent debridement of her wrist which showed necrotizing granulomas. She has since developed lesions on her R arm and for last year has had irritation of R forearm. Has ben migratory/expanding. Looks best today that it has in some time (usually peeling profusely). Usually tender as well. Has been applying nothing to it recently, no lotions.  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  All PAS and fungal stains have been (-).  Was seen in ID 02-03-13 and was felt to have erysipelothrix. She was given doxy.  Today feels like her arm is no better. Has noticed lumps near her ulnar head as well. Has used no hand cream or lotion. She is a pool 5x/week. This only changes color and makes skin peel.    Review of Systems  Constitutional: Negative for fever and chills.  Gastrointestinal: Negative for diarrhea and constipation.  Genitourinary: Negative for difficulty urinating.  Neurological: Negative for dizziness and headaches.       Objective:   Physical Exam  Constitutional: She appears well-developed and well-nourished.  Musculoskeletal:       Arms:         Assessment & Plan:

## 2014-02-24 NOTE — Assessment & Plan Note (Signed)
Will have her seen by hand surgery for repeat Bx, Cx as soon she can get appt. (8:45am tomorrow).  Will see her back in 2-3 weeks.

## 2014-03-01 ENCOUNTER — Other Ambulatory Visit (HOSPITAL_BASED_OUTPATIENT_CLINIC_OR_DEPARTMENT_OTHER): Payer: Medicare Other

## 2014-03-01 ENCOUNTER — Telehealth: Payer: Self-pay | Admitting: Internal Medicine

## 2014-03-01 ENCOUNTER — Encounter: Payer: Self-pay | Admitting: Internal Medicine

## 2014-03-01 ENCOUNTER — Ambulatory Visit (HOSPITAL_BASED_OUTPATIENT_CLINIC_OR_DEPARTMENT_OTHER): Payer: Medicare Other | Admitting: Internal Medicine

## 2014-03-01 VITALS — BP 111/64 | HR 87 | Temp 97.8°F | Resp 18 | Ht 64.0 in | Wt 130.0 lb

## 2014-03-01 DIAGNOSIS — C3490 Malignant neoplasm of unspecified part of unspecified bronchus or lung: Secondary | ICD-10-CM

## 2014-03-01 DIAGNOSIS — C342 Malignant neoplasm of middle lobe, bronchus or lung: Secondary | ICD-10-CM

## 2014-03-01 LAB — CBC WITH DIFFERENTIAL/PLATELET
BASO%: 0.2 % (ref 0.0–2.0)
Basophils Absolute: 0 10*3/uL (ref 0.0–0.1)
EOS%: 5 % (ref 0.0–7.0)
Eosinophils Absolute: 0.3 10*3/uL (ref 0.0–0.5)
HCT: 39.9 % (ref 34.8–46.6)
HGB: 13.2 g/dL (ref 11.6–15.9)
LYMPH%: 10.5 % — ABNORMAL LOW (ref 14.0–49.7)
MCH: 30.8 pg (ref 25.1–34.0)
MCHC: 33.2 g/dL (ref 31.5–36.0)
MCV: 92.8 fL (ref 79.5–101.0)
MONO#: 0.4 10*3/uL (ref 0.1–0.9)
MONO%: 8 % (ref 0.0–14.0)
NEUT#: 4.2 10*3/uL (ref 1.5–6.5)
NEUT%: 76.3 % (ref 38.4–76.8)
PLATELETS: 255 10*3/uL (ref 145–400)
RBC: 4.3 10*6/uL (ref 3.70–5.45)
RDW: 14.7 % — ABNORMAL HIGH (ref 11.2–14.5)
WBC: 5.5 10*3/uL (ref 3.9–10.3)
lymph#: 0.6 10*3/uL — ABNORMAL LOW (ref 0.9–3.3)

## 2014-03-01 LAB — COMPREHENSIVE METABOLIC PANEL (CC13)
ALBUMIN: 3.2 g/dL — AB (ref 3.5–5.0)
ALT: 20 U/L (ref 0–55)
AST: 24 U/L (ref 5–34)
Alkaline Phosphatase: 58 U/L (ref 40–150)
Anion Gap: 8 mEq/L (ref 3–11)
BUN: 20.4 mg/dL (ref 7.0–26.0)
CALCIUM: 9.3 mg/dL (ref 8.4–10.4)
CHLORIDE: 110 meq/L — AB (ref 98–109)
CO2: 27 mEq/L (ref 22–29)
Creatinine: 0.8 mg/dL (ref 0.6–1.1)
Glucose: 82 mg/dl (ref 70–140)
POTASSIUM: 4.1 meq/L (ref 3.5–5.1)
Sodium: 145 mEq/L (ref 136–145)
TOTAL PROTEIN: 5.9 g/dL — AB (ref 6.4–8.3)
Total Bilirubin: 0.73 mg/dL (ref 0.20–1.20)

## 2014-03-01 NOTE — Progress Notes (Signed)
[facility] [phone]   Fax:(336) [place], [place] [address]  DIAGNOSIS AND STAGE: : Stage IIIA nonsmall cell lung cancer, adenocarcinoma, in a never smoker patient with positive EGFR mutation diagnosed in January 2010.   PRIOR THERAPY:  1) Status post concurrent chemoradiation with weekly carboplatin and paclitaxel; last dose given March 21, 2009.  2) Tarceva at 150 mg p.o. daily, status post approximately 48 months of treatment, discontinued secondary to persistent diarrhea.   CURRENT THERAPY: Tarceva at 100 mg p.o. Daily, started March 19 2013, status post 9 months of treatment.   CHEMOTHERAPY INTENT: Maintenance.  CURRENT # OF CHEMOTHERAPY CYCLES: 58 CURRENT ANTIEMETICS: Compazine  CURRENT SMOKING STATUS: Never smoker  ORAL CHEMOTHERAPY AND CONSENT: Tarceva  CURRENT BISPHOSPHONATES USE: None  PAIN MANAGEMENT: 5/10 low back pain and sciatica currently on naproxen.  NARCOTICS INDUCED CONSTIPATION: None  LIVING WILL AND CODE STATUS: Full code but no long-term resuscitation.   INTERVAL HISTORY: Dana [name] 79 y.o. female returns to the clinic today for followup visit. She was seen recently by dermatology as well as infectious disease physicians for persistent skin rash on the right forearm and hand. It was initially thought to represent a fungal infection but no improvement with the antifungal treatment. She has some nodules on the right wrist and she is scheduled to see her surgeon for evaluation of these nodules. She denied having any significant fatigue or weakness. The patient denied having any significant weight loss or night sweats. She has no chest pain, shortness of breath, cough or hemoptysis. The patient is tolerating her current treatment with Tarceva 100 mg by mouth daily fairly well with no significant adverse effects. She denied having any significant skin rash or diarrhea.    MEDICAL HISTORY: Past Medical History  Diagnosis Date  . Hemorrhoid   . COPD (chronic obstructive pulmonary disease)   . Shortness of breath   . GERD (gastroesophageal reflux disease)   . Arthritis   . Cancer     lung, currently being treated with Oral med    ALLERGIES:  is allergic to amoxicillin.  MEDICATIONS:  Current Outpatient Prescriptions  Medication Sig Dispense Refill  . aspirin EC 81 MG tablet Take 81 mg by mouth daily.      . calcium carbonate (OS-CAL) 600 MG TABS Take 600 mg by mouth 3 (three) times daily with meals.       . cholecalciferol (VITAMIN D) 1000 UNITS tablet Take 1,000 Units by mouth daily.       Marland Kitchen co-enzyme Q-10 30 MG capsule Take 30 mg by mouth daily.      Marland Kitchen conjugated estrogens (PREMARIN) vaginal cream Place vaginally once a week.        . Cyanocobalamin (B-12) 1000 MCG CAPS Take 1,000 mcg by mouth 2 (two) times daily.       Marland Kitchen erlotinib (TARCEVA) 100 MG tablet Take 1 tablet (100 mg total) by mouth daily. Take on an empty stomach 1 hour before meals or 2 hours after  30 tablet  2  . loperamide (IMODIUM A-D) 2 MG tablet Take 2 mg by mouth 4 (four) times daily as needed. diarrhea      . loratadine (CLARITIN) 10 MG tablet Take 10 mg by mouth daily.        Marland Kitchen LORazepam (ATIVAN) 0.5 MG tablet Take 1 tablet (0.5 mg total) by mouth every 8 (eight) hours as needed.  30 tablet  0  . Multiple Vitamin (MULTIVITAMIN) tablet Take 1 tablet by mouth daily.        . Omega-3 Fatty Acids (OMEGA-3 FISH OIL) 1200 MG CAPS Take 1 capsule by mouth daily.      Marland Kitchen omeprazole (PRILOSEC) 10 MG capsule Take 20 mg by mouth daily.       Vladimir Faster Glycol-Propyl Glycol (SYSTANE OP) Place 1 drop into both eyes as needed. At night for dry eyes      . tiotropium (SPIRIVA) 18 MCG inhalation capsule Place 18 mcg into inhaler and inhale daily.         No current facility-administered medications for this visit.    SURGICAL HISTORY:  Past Surgical History  Procedure Laterality Date  .  Breast surgery  02/1999    left nipple discharge/ Central duct excision  . Anal fissure repair  07/09/2011  . Knee arthroscopy      R  . Abdominal hysterectomy    . Thumb arthroscopy      L - removed bone spur  . Tonsillectomy    . Appendectomy    . US echocardiography    . Thoracoscopy      w/lung biopsy  . Cardiovascular stress test      REVIEW OF SYSTEMS:  Constitutional: negative Eyes: negative Ears, nose, mouth, throat, and face: negative Respiratory: negative Cardiovascular: negative Gastrointestinal: negative Genitourinary:negative Integument/breast: negative Hematologic/lymphatic: negative Musculoskeletal:negative Neurological: negative Behavioral/Psych: negative Endocrine: negative Allergic/Immunologic: negative   PHYSICAL EXAMINATION: General appearance: alert, cooperative and no distress Head: Normocephalic, without obvious abnormality, atraumatic Neck: no adenopathy, no JVD, supple, symmetrical, trachea midline and thyroid not enlarged, symmetric, no tenderness/mass/nodules Lymph nodes: Cervical, supraclavicular, and axillary nodes normal. Resp: clear to auscultation bilaterally Cardio: regular rate and rhythm, S1, S2 normal, no murmur, click, rub or gallop GI: soft, non-tender; bowel sounds normal; no masses,  no organomegaly Extremities: extremities normal, atraumatic, no cyanosis or edema Neurologic: Alert and oriented X 3, normal strength and tone. Normal symmetric reflexes. Normal coordination and gait  ECOG PERFORMANCE STATUS: 1 - Symptomatic but completely ambulatory  Blood pressure 111/64, pulse 87, temperature 97.8 F (36.6 C), temperature source Oral, resp. rate 18, height $RemoveBe'5\' 4"'cIXrosJNm$  (1.626 m), weight 130 lb (58.968 kg).  LABORATORY DATA: Lab Results  Component Value Date   WBC 5.5 03/01/2014   HGB 13.2 03/01/2014   HCT 39.9 03/01/2014   MCV 92.8 03/01/2014   PLT 255 03/01/2014      Chemistry      Component Value Date/Time   NA 145 03/01/2014 0942   NA  140 01/06/2013 0920   NA 145 11/09/2011 1345   K 4.1 03/01/2014 0942   K 3.2* 01/06/2013 0920   K 3.9 11/09/2011 1345   CL 108* 06/03/2013 0939   CL 102 01/06/2013 0920   CL 104 11/09/2011 1345   CO2 27 03/01/2014 0942   CO2 27 01/06/2013 0920   CO2 28 11/09/2011 1345   BUN 20.4 03/01/2014 0942   BUN 16 01/06/2013 0920   BUN 20 11/09/2011 1345   CREATININE 0.8 03/01/2014 0942   CREATININE 0.92 01/06/2013 0920   CREATININE 0.6 11/09/2011 1345      Component Value Date/Time   CALCIUM 9.3 03/01/2014 0942   CALCIUM 9.5 01/06/2013 0920   CALCIUM 8.7 11/09/2011 1345   ALKPHOS 58 03/01/2014 0942   ALKPHOS 60 01/06/2013 0920   ALKPHOS 52 11/09/2011 1345   AST 24 03/01/2014 0942   AST 28 01/06/2013 0920  AST 20 11/09/2011 1345   ALT 20 03/01/2014 0942   ALT 20 01/06/2013 0920   ALT 21 11/09/2011 1345   BILITOT 0.73 03/01/2014 0942   BILITOT 1.2 01/06/2013 0920   BILITOT 0.60 11/09/2011 1345       RADIOGRAPHIC STUDIES:  ASSESSMENT AND PLAN: This is a very pleasant 78 years old white female with history of stage IIIa non-small cell lung cancer status post concurrent chemoradiation and has been on treatment with Tarceva for the last 57 months with no significant evidence for disease progression. He is tolerating her treatment well. I recommended for her to continue her current treatment with Tarceva 100 mg by mouth daily. I would see her back for followup visit in one month with repeat CT scan of the chest. She was advised to call immediately if she has any concerning symptoms in the interval.  The patient voices understanding of current disease status and treatment options and is in agreement with the current care plan.  All questions were answered. The patient knows to call the clinic with any problems, questions or concerns. We can certainly see the patient much sooner if necessary.  Disclaimer: This note was dictated with voice recognition software. Similar sounding words can inadvertently be transcribed  and may not be corrected upon review.

## 2014-03-01 NOTE — Telephone Encounter (Signed)
gv pt appt schedule for april. central will call re ct - pt aware.

## 2014-03-02 ENCOUNTER — Encounter: Payer: Self-pay | Admitting: *Deleted

## 2014-03-02 NOTE — Progress Notes (Signed)
RECEIVED A FAX FROM [facility] CONCERNING A CONFIRMATION OF PRESCRIPTION SHIPMENT FOR [facility] ON 03/01/14.

## 2014-03-05 ENCOUNTER — Encounter (HOSPITAL_BASED_OUTPATIENT_CLINIC_OR_DEPARTMENT_OTHER): Payer: Self-pay | Admitting: *Deleted

## 2014-03-08 ENCOUNTER — Encounter (HOSPITAL_BASED_OUTPATIENT_CLINIC_OR_DEPARTMENT_OTHER): Payer: Self-pay | Admitting: *Deleted

## 2014-03-08 NOTE — Progress Notes (Signed)
NPO AFTER MN WITH EXCEPTION CLEAR LIQUIDS UNTIL 0700 (NO CREAM/ MILK PRODUCTS).  ARRIVE AT 1130.  CURRENT LAB RESULTS, CHEST CT IN EPIC AND CHART. WILL TAKE PRILOSEC AND DO SPIRIVA INHALER AM DOS W/ SIPS OF WATER.

## 2014-03-10 NOTE — H&P (Signed)
Dana [name] is an 79 y.o. female.   Chief Complaint: RIGHT WRIST LESIONS AND GROWTH HPI: PT FOLLOWED IN OFFICE AND BY DERMATOLOGY AND INFECTIOUS DISEASE PT HERE FOR BIOPSY OF LESIONS ON RIGHT WRIST REGION AND DEBRIDMENT OF SIXTH DORSAL COMPARTMENT PT WITH PRIOR SURGERY ON RIGHT WRIST MULTIPLE BIOPSIES BY DERMATOLOGY   Past Medical History  Diagnosis Date  . Hemorrhoid   . COPD (chronic obstructive pulmonary disease)   . GERD (gastroesophageal reflux disease)   . History of lung cancer ONCOLOGIST--  [facility]--  LAST CT ,  NO RECURRENCE OR METS    DX JAN 2010 --  STAGE IIIA  NON-SMALL CELL ADENOCARCINOMA (RIGHT MIDDLE LOBE)---  S/P  CHEMORADIATIO THERAPY  (COMPLETE 03-21-2009)  . History of anal fissures   . Arthritis     HANDS,  WRISTS  . Osteoporosis   . Right wrist pain     MASS  . Dyspnea on exertion   . Normal cardiac stress test     2007  PER PT  . Wears glasses   . Rash, skin     RIGHT FOREARM/ HAND    Past Surgical History  Procedure Laterality Date  . Anal fissure repair  07/09/2011    INTERNAL SPHINCTEROTOMY  . Knee arthroscopy Right 1999  . Thumb arthroscopy Left     - removed bone spur  . Thoracoscopy  01-26-2009    w/   lung/  node biopsy's  . Benign excision left breast central duct  02/1999  . Vault suspension plus cystocele repair with graft  06-13-2010  . Transthoracic echocardiogram  12-23-2008    NORMAL LV/  EF 65-70%/  MILD MR  &  TR  . Total abdominal hysterectomy w/ bilateral salpingoophorectomy  1982    W/  APPENDECTOMY  . Tonsillectomy  AS CHILD  . Excision right wrist mass  2012    Family History  Problem Relation Age of Onset  . Cancer Father     bladder  . Cancer Son     kidney - removed as a teenager   Social History:  reports that she quit smoking about 36 years ago. Her smoking use included Cigarettes. She has a 30 pack-year smoking history. She has never used smokeless tobacco. She reports that she does not drink alcohol or use  illicit drugs.  Allergies:  Allergies  Allergen Reactions  . Amoxicillin Diarrhea    Hx of C Diff     No prescriptions prior to admission    No results found for this or any previous visit (from the past 48 hour(s)). No results found.  ROS NO RECENT ILLNESSES OR HOSPITALIZATIONS  Height 5\' 4"  (1.626 m), weight 58.06 kg (128 lb). Physical Exam  General Appearance:  Alert, cooperative, no distress, appears stated age  Head:  Normocephalic, without obvious abnormality, atraumatic  Eyes:  Pupils equal, conjunctiva/corneas clear,         Throat: Lips, mucosa, and tongue normal; teeth and gums normal  Neck: No visible masses     Lungs:   respirations unlabored  Chest Wall:  No tenderness or deformity  Heart:  Regular rate and rhythm,  Abdomen:   Soft, non-tender,         Extremities: RIGHT WRIST/FOREARM:MULTIPLE SKIN LESIONS OVER DISTAL FOREARM, MODERATE SOFT TISSUE SWELLING/MASS OVER SIXTH DORSAL COMPARTMENT, SMALL SOFT TISSUE MASSES OVER DORSUM OF WRIST FINGERS WARM WELL PERFUSED GOOD NEUROMOTOR FUNCTION OF HAND  Pulses: 2+ and symmetric  Skin: Skin color, texture, turgor normal, no  rashes or lesions     Neurologic: Normal    Assessment/Plan RIGHT DISTAL FOREARM/WRIST LESIONS AND WRIST MASS  RIGHT DISTAL FOREARM/WRIST INCISIONAL BIOPSIES AND SIXTH DORSAL COMPARTMENT DEBRIDEMENT AND BIOPSY  R/B/A DISCUSSED WITH PT IN OFFICE.  PT VOICED UNDERSTANDING OF PLAN CONSENT SIGNED DAY OF SURGERY PT SEEN AND EXAMINED PRIOR TO OPERATIVE PROCEDURE/DAY OF SURGERY SITE MARKED. QUESTIONS ANSWERED WILL GO HOME FOLLOWING SURGERY  Linna Hoff 3/19/F2015 AT 1003

## 2014-03-10 NOTE — Brief Op Note (Signed)
03/11/2014  10:05 PM  PATIENT:  Dana [name]  79 y.o. female  PRE-OPERATIVE DIAGNOSIS:  RIGHT WRIST MASS  POST-OPERATIVE DIAGNOSIS:  RIGHT WRIST MASS  PROCEDURE:  Procedure(s): RIGHT WRIST DEEP MASS EXCISION WITH CULTURE AND BIOSPY (Right)  SURGEON:  Surgeon(s) and Role:    * Linna Hoff, MD - Primary  PHYSICIAN ASSISTANT:   ASSISTANTS: none   ANESTHESIA:   general  EBL:     BLOOD ADMINISTERED:none  DRAINS: none   LOCAL MEDICATIONS USED:  MARCAINE     SPECIMEN:  Excision  DISPOSITION OF SPECIMEN:  PATHOLOGY  COUNTS:  YES  TOURNIQUET:  * No tourniquets in log *  DICTATION: .[id number]  PLAN OF CARE: Discharge to home after PACU  PATIENT DISPOSITION:  PACU - hemodynamically stable.   Delay start of Pharmacological VTE agent (>24hrs) due to surgical blood loss or risk of bleeding: not applicable

## 2014-03-11 ENCOUNTER — Encounter (HOSPITAL_BASED_OUTPATIENT_CLINIC_OR_DEPARTMENT_OTHER): Payer: Medicare Other | Admitting: Anesthesiology

## 2014-03-11 ENCOUNTER — Ambulatory Visit (HOSPITAL_BASED_OUTPATIENT_CLINIC_OR_DEPARTMENT_OTHER)
Admission: RE | Admit: 2014-03-11 | Discharge: 2014-03-11 | Disposition: A | Discharge status: Home / Self Care | Payer: Medicare Other | Source: Ambulatory Visit | Attending: Orthopedic Surgery | Admitting: Orthopedic Surgery

## 2014-03-11 ENCOUNTER — Encounter (HOSPITAL_BASED_OUTPATIENT_CLINIC_OR_DEPARTMENT_OTHER)
Admission: RE | Disposition: A | Discharge status: Home / Self Care | Payer: Self-pay | Source: Ambulatory Visit | Attending: Orthopedic Surgery

## 2014-03-11 ENCOUNTER — Ambulatory Visit (HOSPITAL_BASED_OUTPATIENT_CLINIC_OR_DEPARTMENT_OTHER): Payer: Medicare Other | Admitting: Anesthesiology

## 2014-03-11 ENCOUNTER — Encounter (HOSPITAL_BASED_OUTPATIENT_CLINIC_OR_DEPARTMENT_OTHER): Payer: Self-pay | Admitting: Anesthesiology

## 2014-03-11 DIAGNOSIS — J4489 Other specified chronic obstructive pulmonary disease: Secondary | ICD-10-CM | POA: Insufficient documentation

## 2014-03-11 DIAGNOSIS — M810 Age-related osteoporosis without current pathological fracture: Secondary | ICD-10-CM | POA: Insufficient documentation

## 2014-03-11 DIAGNOSIS — J449 Chronic obstructive pulmonary disease, unspecified: Secondary | ICD-10-CM | POA: Insufficient documentation

## 2014-03-11 DIAGNOSIS — Z87891 Personal history of nicotine dependence: Secondary | ICD-10-CM | POA: Insufficient documentation

## 2014-03-11 DIAGNOSIS — R2230 Localized swelling, mass and lump, unspecified upper limb: Secondary | ICD-10-CM

## 2014-03-11 DIAGNOSIS — Z85118 Personal history of other malignant neoplasm of bronchus and lung: Secondary | ICD-10-CM | POA: Insufficient documentation

## 2014-03-11 DIAGNOSIS — K219 Gastro-esophageal reflux disease without esophagitis: Secondary | ICD-10-CM | POA: Insufficient documentation

## 2014-03-11 DIAGNOSIS — Z9079 Acquired absence of other genital organ(s): Secondary | ICD-10-CM | POA: Insufficient documentation

## 2014-03-11 DIAGNOSIS — Z90710 Acquired absence of both cervix and uterus: Secondary | ICD-10-CM | POA: Insufficient documentation

## 2014-03-11 DIAGNOSIS — R229 Localized swelling, mass and lump, unspecified: Secondary | ICD-10-CM | POA: Insufficient documentation

## 2014-03-11 HISTORY — DX: Age-related osteoporosis without current pathological fracture: M81.0

## 2014-03-11 HISTORY — DX: Other forms of dyspnea: R06.09

## 2014-03-11 HISTORY — DX: Presence of spectacles and contact lenses: Z97.3

## 2014-03-11 HISTORY — DX: Personal history of other diseases of the digestive system: Z87.19

## 2014-03-11 HISTORY — DX: Personal history of other malignant neoplasm of bronchus and lung: Z85.118

## 2014-03-11 HISTORY — DX: Rash and other nonspecific skin eruption: R21

## 2014-03-11 HISTORY — DX: Pain in right wrist: M25.531

## 2014-03-11 HISTORY — DX: Dyspnea, unspecified: R06.00

## 2014-03-11 HISTORY — PX: MASS EXCISION: SHX2000

## 2014-03-11 HISTORY — DX: Reserved for inherently not codable concepts without codable children: IMO0001

## 2014-03-11 SURGERY — EXCISION MASS
Anesthesia: General | Site: Wrist | Laterality: Right

## 2014-03-11 MED ORDER — CEFAZOLIN SODIUM-DEXTROSE 2-3 GM-% IV SOLR
2.0000 g | INTRAVENOUS | Status: AC
  Administered 2014-03-11: 2 g via INTRAVENOUS
  Filled 2014-03-11: qty 50

## 2014-03-11 MED ORDER — BUPIVACAINE HCL 0.25 % IJ SOLN
INTRAMUSCULAR | Status: DC | PRN
  Administered 2014-03-11: 10 mL

## 2014-03-11 MED ORDER — FENTANYL CITRATE 0.05 MG/ML IJ SOLN
INTRAMUSCULAR | Status: AC
  Filled 2014-03-11: qty 4

## 2014-03-11 MED ORDER — FENTANYL CITRATE 0.05 MG/ML IJ SOLN
25.0000 ug | INTRAMUSCULAR | Status: DC | PRN
  Filled 2014-03-11: qty 1

## 2014-03-11 MED ORDER — DEXAMETHASONE SODIUM PHOSPHATE 4 MG/ML IJ SOLN
INTRAMUSCULAR | Status: DC | PRN
  Administered 2014-03-11: 10 mg via INTRAVENOUS

## 2014-03-11 MED ORDER — LIDOCAINE HCL (CARDIAC) 20 MG/ML IV SOLN
INTRAVENOUS | Status: DC | PRN
  Administered 2014-03-11: 50 mg via INTRAVENOUS

## 2014-03-11 MED ORDER — PROMETHAZINE HCL 25 MG/ML IJ SOLN
6.2500 mg | INTRAMUSCULAR | Status: DC | PRN
  Filled 2014-03-11: qty 1

## 2014-03-11 MED ORDER — DOCUSATE SODIUM 100 MG PO CAPS: 100.0000 mg | ORAL_CAPSULE | Freq: Two times a day (BID) | ORAL | Status: DC

## 2014-03-11 MED ORDER — PROPOFOL 10 MG/ML IV BOLUS
INTRAVENOUS | Status: DC | PRN
  Administered 2014-03-11: 110 mg via INTRAVENOUS

## 2014-03-11 MED ORDER — ONDANSETRON HCL 4 MG/2ML IJ SOLN
INTRAMUSCULAR | Status: DC | PRN
  Administered 2014-03-11: 4 mg via INTRAVENOUS

## 2014-03-11 MED ORDER — CHLORHEXIDINE GLUCONATE 4 % EX LIQD
60.0000 mL | Freq: Once | CUTANEOUS | Status: DC
  Filled 2014-03-11: qty 60

## 2014-03-11 MED ORDER — FENTANYL CITRATE 0.05 MG/ML IJ SOLN
INTRAMUSCULAR | Status: DC | PRN
  Administered 2014-03-11: 25 ug via INTRAVENOUS
  Administered 2014-03-11: 50 ug via INTRAVENOUS
  Administered 2014-03-11: 25 ug via INTRAVENOUS

## 2014-03-11 MED ORDER — LACTATED RINGERS IV SOLN
INTRAVENOUS | Status: DC
  Administered 2014-03-11: 12:00:00 via INTRAVENOUS
  Filled 2014-03-11: qty 1000

## 2014-03-11 MED ORDER — HYDROCODONE-ACETAMINOPHEN 5-300 MG PO TABS: 1.0000 | ORAL_TABLET | Freq: Four times a day (QID) | ORAL | Status: DC | PRN

## 2014-03-11 SURGICAL SUPPLY — 50 items
BANDAGE ELASTIC 3 VELCRO ST LF (GAUZE/BANDAGES/DRESSINGS) ×3 IMPLANT
BLADE SURG 15 STRL LF DISP TIS (BLADE) ×1 IMPLANT
BLADE SURG 15 STRL SS (BLADE) ×3
BNDG CMPR 9X4 STRL LF SNTH (GAUZE/BANDAGES/DRESSINGS) ×1
BNDG CONFORM 3 STRL LF (GAUZE/BANDAGES/DRESSINGS) ×3 IMPLANT
BNDG ESMARK 4X9 LF (GAUZE/BANDAGES/DRESSINGS) ×3 IMPLANT
CORDS BIPOLAR (ELECTRODE) IMPLANT
COVER MAYO STAND STRL (DRAPES) ×3 IMPLANT
COVER TABLE BACK 60X90 (DRAPES) ×3 IMPLANT
CUFF TOURNIQUET SINGLE 18IN (TOURNIQUET CUFF) IMPLANT
DRAPE EXTREMITY T 121X128X90 (DRAPE) ×3 IMPLANT
DRAPE LG THREE QUARTER DISP (DRAPES) ×6 IMPLANT
DRAPE SURG 17X23 STRL (DRAPES) ×3 IMPLANT
DRAPE U-SHAPE 47X51 STRL (DRAPES) IMPLANT
DRSG EMULSION OIL 3X3 NADH (GAUZE/BANDAGES/DRESSINGS) ×2 IMPLANT
GAUZE SPONGE 4X4 16PLY XRAY LF (GAUZE/BANDAGES/DRESSINGS) IMPLANT
GAUZE XEROFORM 1X8 LF (GAUZE/BANDAGES/DRESSINGS) IMPLANT
GLOVE BIO SURGEON STRL SZ8 (GLOVE) ×3 IMPLANT
GLOVE BIOGEL M STER SZ 6 (GLOVE) ×2 IMPLANT
GLOVE BIOGEL PI IND STRL 6.5 (GLOVE) IMPLANT
GLOVE BIOGEL PI IND STRL 8.5 (GLOVE) ×1 IMPLANT
GLOVE BIOGEL PI INDICATOR 6.5 (GLOVE) ×2
GLOVE BIOGEL PI INDICATOR 8.5 (GLOVE) ×2
GOWN STRL REUS W/ TWL LRG LVL3 (GOWN DISPOSABLE) ×1 IMPLANT
GOWN STRL REUS W/ TWL XL LVL3 (GOWN DISPOSABLE) ×1 IMPLANT
GOWN STRL REUS W/TWL LRG LVL3 (GOWN DISPOSABLE) ×5 IMPLANT
GOWN STRL REUS W/TWL XL LVL3 (GOWN DISPOSABLE) ×5 IMPLANT
KNIFE CARPAL TUNNEL (BLADE) IMPLANT
LOOP VESSEL MAXI BLUE (MISCELLANEOUS) IMPLANT
NDL HYPO 25X1 1.5 SAFETY (NEEDLE) ×2 IMPLANT
NDL SAFETY ECLIPSE 18X1.5 (NEEDLE) ×2 IMPLANT
NEEDLE HYPO 18GX1.5 SHARP (NEEDLE) ×6
NEEDLE HYPO 25X1 1.5 SAFETY (NEEDLE) ×6 IMPLANT
NS IRRIG 500ML POUR BTL (IV SOLUTION) ×3 IMPLANT
PACK BASIN DAY SURGERY FS (CUSTOM PROCEDURE TRAY) ×3 IMPLANT
PAD ALCOHOL SWAB (MISCELLANEOUS) ×12 IMPLANT
PAD CAST 3X4 CTTN HI CHSV (CAST SUPPLIES) ×1 IMPLANT
PADDING CAST ABS 3INX4YD NS (CAST SUPPLIES)
PADDING CAST ABS 4INX4YD NS (CAST SUPPLIES) ×2
PADDING CAST ABS COTTON 3X4 (CAST SUPPLIES) IMPLANT
PADDING CAST ABS COTTON 4X4 ST (CAST SUPPLIES) ×1 IMPLANT
PADDING CAST COTTON 3X4 STRL (CAST SUPPLIES) ×3
SPONGE GAUZE 4X4 12PLY STER LF (GAUZE/BANDAGES/DRESSINGS) ×3 IMPLANT
STOCKINETTE 4X48 STRL (DRAPES) ×3 IMPLANT
SUT PROLENE 4 0 PS 2 18 (SUTURE) ×3 IMPLANT
SYR BULB 3OZ (MISCELLANEOUS) ×3 IMPLANT
SYR CONTROL 10ML LL (SYRINGE) ×6 IMPLANT
TOWEL OR 17X24 6PK STRL BLUE (TOWEL DISPOSABLE) ×3 IMPLANT
TRAY DSU PREP LF (CUSTOM PROCEDURE TRAY) ×3 IMPLANT
UNDERPAD 30X30 INCONTINENT (UNDERPADS AND DIAPERS) ×3 IMPLANT

## 2014-03-11 NOTE — Anesthesia Procedure Notes (Signed)
Procedure Name: LMA Insertion Date/Time: 03/11/2014 1:31 PM Performed by: [name] T Pre-anesthesia Checklist: Patient identified, Emergency Drugs available, Suction available and Patient being monitored Patient Re-evaluated:Patient Re-evaluated prior to inductionOxygen Delivery Method: Circle System Utilized Preoxygenation: Pre-oxygenation with 100% oxygen Intubation Type: IV induction Ventilation: Mask ventilation without difficulty LMA: LMA inserted LMA Size: 4.0 Number of attempts: 1 Airway Equipment and Method: bite block Placement Confirmation: positive ETCO2 Tube secured with: Tape Dental Injury: Teeth and Oropharynx as per pre-operative assessment

## 2014-03-11 NOTE — Anesthesia Postprocedure Evaluation (Signed)
  Anesthesia Post-op Note  Patient: Dana [name]  Procedure(s) Performed: Procedure(s) (LRB): RIGHT WRIST DEEP MASS EXCISION WITH CULTURE AND BIOSPY (Right)  Patient Location: PACU  Anesthesia Type: General  Level of Consciousness: awake and alert   Airway and Oxygen Therapy: Patient Spontanous Breathing  Post-op Pain: mild  Post-op Assessment: Post-op Vital signs reviewed, Patient's Cardiovascular Status Stable, Respiratory Function Stable, Patent Airway and No signs of Nausea or vomiting  Last Vitals:  Filed Vitals:   03/11/14 1519  BP: 117/70  Pulse: 83  Temp: 36.3 C  Resp: 18    Post-op Vital Signs: stable   Complications: No apparent anesthesia complications

## 2014-03-11 NOTE — Discharge Instructions (Signed)
KEEP BANDAGE CLEAN AND DRY CALL OFFICE FOR F/U APPT [phone] in 14 days KEEP HAND ELEVATED ABOVE HEART OK TO APPLY ICE TO OPERATIVE AREA CONTACT OFFICE IF ANY WORSENING PAIN OR CONCERNS.         HAND SURGERY    HOME CARE INSTRUCTIONS    The following instructions have been prepared to help you care for yourself upon your return home today.  Wound Care:  Keep your hand elevated above the level of your heart. Do not allow it to dangle by your side. Keep the dressing dry and do not remove it unless your doctor advises you to do so. He will usually change it at the time of you post-op visit. Moving your fingers is advised to stimulate circulation but will depend on the site of your surgery. Of course, if you have a splint applied your doctor will advise you about movement.  Activity:  Do not drive or operate machinery today. Rest today and then you may return to your normal activity and work as indicated by your physician.  Diet: Drink liquids today or eat a light diet. You may resume a regular diet tomorrow.  General expectations: Pain for two or three days. Fingers may become slightly swollen.   Unexpected Observations- Call your doctor if any of these occur: Severe pain not relieved by pain medication. Elevated temperature. Dressing soaked with blood. Inability to move fingers. White or bluish color to fingers.  Return to office: ***   Post Anesthesia Home Care Instructions  Activity: Get plenty of rest for the remainder of the day. A responsible adult should stay with you for 24 hours following the procedure.  For the next 24 hours, DO NOT: -Drive a car -Paediatric nurse -Drink alcoholic beverages -Take any medication unless instructed by your physician -Make any legal decisions or sign important papers.  Meals: Start with liquid foods such as gelatin or soup. Progress to regular foods as tolerated. Avoid greasy, spicy, heavy foods. If nausea and/or vomiting occur,  drink only clear liquids until the nausea and/or vomiting subsides. Call your physician if vomiting continues.  Special Instructions/Symptoms: Your throat may feel dry or sore from the anesthesia or the breathing tube placed in your throat during surgery. If this causes discomfort, gargle with warm salt water. The discomfort should disappear within 24 hours.

## 2014-03-11 NOTE — Anesthesia Preprocedure Evaluation (Signed)
Anesthesia Evaluation  Patient identified by MRN, date of birth, ID band Patient awake    Reviewed: Allergy & Precautions, H&P , NPO status , Patient's Chart, lab work & pertinent test results  Airway Mallampati: II TM Distance: >3 FB Neck ROM: Full    Dental no notable dental hx.    Pulmonary shortness of breath, COPDformer smoker,  breath sounds clear to auscultation  Pulmonary exam normal       Cardiovascular negative cardio ROS  Rhythm:Regular Rate:Normal     Neuro/Psych negative neurological ROS  negative psych ROS   GI/Hepatic Neg liver ROS, GERD-  ,  Endo/Other  negative endocrine ROS  Renal/GU negative Renal ROS  negative genitourinary   Musculoskeletal negative musculoskeletal ROS (+)   Abdominal   Peds negative pediatric ROS (+)  Hematology negative hematology ROS (+)   Anesthesia Other Findings   Reproductive/Obstetrics negative OB ROS                           Anesthesia Physical Anesthesia Plan  ASA: II  Anesthesia Plan: General   Post-op Pain Management:    Induction: Intravenous  Airway Management Planned: LMA  Additional Equipment:   Intra-op Plan:   Post-operative Plan: Extubation in OR  Informed Consent: I have reviewed the patients History and Physical, chart, labs and discussed the procedure including the risks, benefits and alternatives for the proposed anesthesia with the patient or authorized representative who has indicated his/her understanding and acceptance.   Dental advisory given  Plan Discussed with: CRNA  Anesthesia Plan Comments:         Anesthesia Quick Evaluation

## 2014-03-11 NOTE — Transfer of Care (Signed)
Immediate Anesthesia Transfer of Care Note  Patient: Dana [name]  Procedure(s) Performed: Procedure(s): RIGHT WRIST DEEP MASS EXCISION WITH CULTURE AND BIOSPY (Right)  Patient Location: PACU  Anesthesia Type:General  Level of Consciousness: awake and oriented  Airway & Oxygen Therapy: Patient Spontanous Breathing and Patient connected to nasal cannula oxygen  Post-op Assessment: Report given to PACU RN  Post vital signs: Reviewed and stable  Complications: No apparent anesthesia complications

## 2014-03-12 ENCOUNTER — Encounter (HOSPITAL_BASED_OUTPATIENT_CLINIC_OR_DEPARTMENT_OTHER): Payer: Self-pay | Admitting: Orthopedic Surgery

## 2014-03-12 NOTE — Op Note (Signed)
Dana [name], Dana [name]                ACCOUNT NO.:  [id number]  MEDICAL RECORD NO.:  [id number]  LOCATION:                                 FACILITY:  PHYSICIAN:  Melrose Nakayama, MD  DATE OF BIRTH:  1935-09-10  DATE OF PROCEDURE:  03/11/2014 DATE OF DISCHARGE:                              OPERATIVE REPORT   PREOPERATIVE DIAGNOSES: 1. Right wrist dorsal ulnar mass. 2. Right wrist dorsal mass.  POSTOPERATIVE DIAGNOSES: 1. Right wrist dorsal ulnar mass. 2. Right wrist dorsal mass..  ATTENDING PHYSICIAN:  Melrose Nakayama, MD who scrubbed for the entire procedure.  ASSISTANT SURGEON:  None.  ANESTHESIA:  General via LMA.  PROCEDURE: 1. Right wrist 6 dorsal compartments extensor tenosynovectomy and mass     removal. 2. Right wrist dorsal deep mass greater than 3 cm wrist region,     subfascial.  SURGICAL INDICATIONS:  Dana [name] is a right-hand-dominant female with persistent mass on the dorsal ulnar aspect of the wrist as well as the dorsal aspect of wrist.  The patient first refused to undergo similar procedures and is back today for re-incisional biopsy and debridement. Risks, benefits, and alternatives were discussed in detail with the patient.  Signed informed consent was obtained.  Risks include, but not limited to bleeding, infection, damage to nearby nerves, arteries, or tendons, loss of motion in wrist and digits, incomplete relief of symptoms, and need for further surgical intervention.  DESCRIPTION OF PROCEDURE:  The patient was properly identified in the preop holding area and marked with a permanent marker, made on the right wrist to indicate correct operative site.  The patient was then brought back to the operating room, placed supine on the anesthesia room table. General anesthesia was administered.  The patient tolerated this well. A well-padded tourniquet was then placed on the right brachium sealed with 1000 drape.  Right upper extremity was then prepped  and draped in normal sterile fashion.  Time-out was called, correct side was identified, and procedure then begun.  Attention was then turned to the right wrist where a curvilinear incision made directly over the dorsal aspect of the wrist region centrally.  Limb was then elevated and tourniquet insufflated.  Dissection was carried down through skin and subcutaneous tissue where the mass was then encountered. Circumferential dissection then carried around the mass extending all the way down through the retinaculum.  Incisional biopsy was then performed.  Several small pieces were then removed and sent for culture. The debridement was then carried all the way down to the retinaculum where the greater than 3 cm mass was then incised and then debrided and partially removed.  This was not taken with clear margins.  After partial excisional biopsy, the wound was then thoroughly irrigated. This was then sent for the appropriate tissue analysis and culture.  The skin was then closed using horizontal mattress Prolene sutures.  Through a separate incision along the ulnar aspect of the wrist, dissection was then carried down through the skin and subcutaneous tissue.  Portion of the sixth dorsal compartment was then opened up and tenosynovectomy was then carried out and incisional debulking was then carried out of  the mass and this was then sent for pathology.  After tenosynovectomy and partial mass removal, the wound was then irrigated.  The fascial layer over was then closed with Monocryl suture, and skin closed with 4-0 Prolene suture.  Adaptic dressing, sterile compressive bandage applied. The tourniquet deflated.  The 10 mL of 0.25% Marcaine infiltrated through incisions.  The patient tolerated the procedure well and returned to recovery room in good condition.  POSTPROCEDURE PLAN:  The patient was discharged to home, seen back in the office in approximately 2 weeks wound check and go over  the pathology.  Intraoperative cultures, aerobic, anaerobic Gram stain.  Following this, AFB mycobacterium was sent as well as tissue for gross pathology.     Melrose Nakayama, MD     FWO/MEDQ  D:  03/11/2014  T:  03/11/2014  Job:  [id number]  cc:   Rolm Bookbinder, MD Fax: [phone]  Doroteo Bradford. Johnnye Sima, M.D. Fax: [phone]

## 2014-03-15 LAB — TISSUE CULTURE
CULTURE: NO GROWTH
Culture: NO GROWTH
GRAM STAIN: NONE SEEN
Gram Stain: NONE SEEN

## 2014-03-16 LAB — ANAEROBIC CULTURE
Gram Stain: NONE SEEN
Gram Stain: NONE SEEN

## 2014-03-24 ENCOUNTER — Other Ambulatory Visit (HOSPITAL_BASED_OUTPATIENT_CLINIC_OR_DEPARTMENT_OTHER): Payer: Medicare Other

## 2014-03-24 ENCOUNTER — Ambulatory Visit (HOSPITAL_COMMUNITY)
Admission: RE | Admit: 2014-03-24 | Discharge: 2014-03-24 | Disposition: A | Discharge status: Home / Self Care | Payer: Medicare Other | Source: Ambulatory Visit | Attending: Internal Medicine | Admitting: Internal Medicine

## 2014-03-24 DIAGNOSIS — C342 Malignant neoplasm of middle lobe, bronchus or lung: Secondary | ICD-10-CM

## 2014-03-24 DIAGNOSIS — Y842 Radiological procedure and radiotherapy as the cause of abnormal reaction of the patient, or of later complication, without mention of misadventure at the time of the procedure: Secondary | ICD-10-CM | POA: Insufficient documentation

## 2014-03-24 DIAGNOSIS — Z9221 Personal history of antineoplastic chemotherapy: Secondary | ICD-10-CM | POA: Insufficient documentation

## 2014-03-24 DIAGNOSIS — J8410 Pulmonary fibrosis, unspecified: Secondary | ICD-10-CM | POA: Insufficient documentation

## 2014-03-24 DIAGNOSIS — J90 Pleural effusion, not elsewhere classified: Secondary | ICD-10-CM | POA: Insufficient documentation

## 2014-03-24 DIAGNOSIS — C3490 Malignant neoplasm of unspecified part of unspecified bronchus or lung: Secondary | ICD-10-CM

## 2014-03-24 DIAGNOSIS — Z923 Personal history of irradiation: Secondary | ICD-10-CM | POA: Insufficient documentation

## 2014-03-24 DIAGNOSIS — M47817 Spondylosis without myelopathy or radiculopathy, lumbosacral region: Secondary | ICD-10-CM | POA: Insufficient documentation

## 2014-03-24 DIAGNOSIS — Z85118 Personal history of other malignant neoplasm of bronchus and lung: Secondary | ICD-10-CM | POA: Insufficient documentation

## 2014-03-24 DIAGNOSIS — M47814 Spondylosis without myelopathy or radiculopathy, thoracic region: Secondary | ICD-10-CM | POA: Insufficient documentation

## 2014-03-24 LAB — COMPREHENSIVE METABOLIC PANEL (CC13)
ALT: 31 U/L (ref 0–55)
ANION GAP: 9 meq/L (ref 3–11)
AST: 24 U/L (ref 5–34)
Albumin: 3.3 g/dL — ABNORMAL LOW (ref 3.5–5.0)
Alkaline Phosphatase: 74 U/L (ref 40–150)
BUN: 19.3 mg/dL (ref 7.0–26.0)
CHLORIDE: 110 meq/L — AB (ref 98–109)
CO2: 25 meq/L (ref 22–29)
Calcium: 9.2 mg/dL (ref 8.4–10.4)
Creatinine: 0.8 mg/dL (ref 0.6–1.1)
GLUCOSE: 92 mg/dL (ref 70–140)
Potassium: 4 mEq/L (ref 3.5–5.1)
SODIUM: 145 meq/L (ref 136–145)
TOTAL PROTEIN: 6 g/dL — AB (ref 6.4–8.3)
Total Bilirubin: 0.93 mg/dL (ref 0.20–1.20)

## 2014-03-24 LAB — CBC WITH DIFFERENTIAL/PLATELET
BASO%: 0.5 % (ref 0.0–2.0)
Basophils Absolute: 0 10*3/uL (ref 0.0–0.1)
EOS ABS: 0.4 10*3/uL (ref 0.0–0.5)
EOS%: 7.6 % — ABNORMAL HIGH (ref 0.0–7.0)
HEMATOCRIT: 38.4 % (ref 34.8–46.6)
HGB: 12.9 g/dL (ref 11.6–15.9)
LYMPH#: 0.7 10*3/uL — AB (ref 0.9–3.3)
LYMPH%: 13 % — ABNORMAL LOW (ref 14.0–49.7)
MCH: 31 pg (ref 25.1–34.0)
MCHC: 33.5 g/dL (ref 31.5–36.0)
MCV: 92.7 fL (ref 79.5–101.0)
MONO#: 0.5 10*3/uL (ref 0.1–0.9)
MONO%: 9.6 % (ref 0.0–14.0)
NEUT%: 69.3 % (ref 38.4–76.8)
NEUTROS ABS: 3.6 10*3/uL (ref 1.5–6.5)
Platelets: 286 10*3/uL (ref 145–400)
RBC: 4.15 10*6/uL (ref 3.70–5.45)
RDW: 15 % — ABNORMAL HIGH (ref 11.2–14.5)
WBC: 5.2 10*3/uL (ref 3.9–10.3)

## 2014-03-24 MED ORDER — IOHEXOL 300 MG/ML  SOLN
100.0000 mL | Freq: Once | INTRAMUSCULAR | Status: AC | PRN
  Administered 2014-03-24: 100 mL via INTRAVENOUS

## 2014-03-30 ENCOUNTER — Encounter: Payer: Self-pay | Admitting: Internal Medicine

## 2014-03-30 ENCOUNTER — Ambulatory Visit (HOSPITAL_BASED_OUTPATIENT_CLINIC_OR_DEPARTMENT_OTHER): Payer: Medicare Other | Admitting: Internal Medicine

## 2014-03-30 ENCOUNTER — Telehealth: Payer: Self-pay | Admitting: Internal Medicine

## 2014-03-30 VITALS — BP 111/68 | HR 96 | Temp 97.9°F | Resp 18 | Ht 64.0 in | Wt 129.3 lb

## 2014-03-30 DIAGNOSIS — C342 Malignant neoplasm of middle lobe, bronchus or lung: Secondary | ICD-10-CM

## 2014-03-30 DIAGNOSIS — R21 Rash and other nonspecific skin eruption: Secondary | ICD-10-CM

## 2014-03-30 DIAGNOSIS — C3490 Malignant neoplasm of unspecified part of unspecified bronchus or lung: Secondary | ICD-10-CM

## 2014-03-30 NOTE — Progress Notes (Signed)
[facility] [phone]   Fax:(336) [place], [place] [address]  DIAGNOSIS AND STAGE: : Stage IIIB (T2a, N3, M0) nonsmall cell lung cancer, adenocarcinoma, in a never smoker patient with positive EGFR mutation diagnosed in January 2010.   PRIOR THERAPY:  1) Status post concurrent chemoradiation with weekly carboplatin and paclitaxel; last dose given March 21, 2009.  2) Tarceva at 150 mg p.o. daily, status post approximately 48 months of treatment, discontinued secondary to persistent diarrhea.   CURRENT THERAPY: Tarceva at 100 mg p.o. Daily, started March 19 2013, status post 10 months of treatment.   CHEMOTHERAPY INTENT: Maintenance.  CURRENT # OF CHEMOTHERAPY CYCLES: 59 CURRENT ANTIEMETICS: Compazine  CURRENT SMOKING STATUS: Never smoker  ORAL CHEMOTHERAPY AND CONSENT: Tarceva  CURRENT BISPHOSPHONATES USE: None  PAIN MANAGEMENT: 5/10 low back pain and sciatica currently on naproxen.  NARCOTICS INDUCED CONSTIPATION: None  LIVING WILL AND CODE STATUS: Full code but no long-term resuscitation.   INTERVAL HISTORY: Dana [name] 79 y.o. female returns to the clinic today for followup visit. She was seen recently by dermatology as well as infectious disease physicians and hand surgeon for persistent skin rash and swelling on the right forearm and hand. There is no clear etiology for the rash and swelling at this point. She denied having any significant fatigue or weakness. The patient denied having any significant weight loss or night sweats. She has no chest pain, shortness of breath, cough or hemoptysis. The patient is tolerating her current treatment with Tarceva 100 mg by mouth daily fairly well with no significant adverse effects. She denied having any significant skin rash or diarrhea. She had repeat CT scan of the chest performed recently and she is here for evaluation and discussion of  her scan results.   MEDICAL HISTORY: Past Medical History  Diagnosis Date  . Hemorrhoid   . COPD (chronic obstructive pulmonary disease)   . GERD (gastroesophageal reflux disease)   . History of lung cancer ONCOLOGIST--  DR [facility]--  LAST CT ,  NO RECURRENCE OR METS    DX JAN 2010 --  STAGE IIIA  NON-SMALL CELL ADENOCARCINOMA (RIGHT MIDDLE LOBE)---  S/P  CHEMORADIATIO THERAPY  (COMPLETE 03-21-2009)  . History of anal fissures   . Arthritis     HANDS,  WRISTS  . Osteoporosis   . Right wrist pain     MASS  . Dyspnea on exertion   . Normal cardiac stress test     2007  PER PT  . Wears glasses   . Rash, skin     RIGHT FOREARM/ HAND    ALLERGIES:  is allergic to amoxicillin.  MEDICATIONS:  Current Outpatient Prescriptions  Medication Sig Dispense Refill  . aspirin EC 81 MG tablet Take 81 mg by mouth daily.      . calcium carbonate (OS-CAL) 600 MG TABS Take 600 mg by mouth 3 (three) times daily with meals.       . cholecalciferol (VITAMIN D) 1000 UNITS tablet Take 1,000 Units by mouth daily.       . Coenzyme Q10 (COQ10) 100 MG CAPS Take 1 capsule by mouth daily.       Marland Kitchen conjugated estrogens (PREMARIN) vaginal cream Place vaginally once a week.        . Cyanocobalamin (B-12) 1000 MCG CAPS Take 1,000 mcg by mouth 2 (two) times daily.       Marland Kitchen docusate  sodium (COLACE) 100 MG capsule Take 1 capsule (100 mg total) by mouth 2 (two) times daily.  30 capsule  0  . erlotinib (TARCEVA) 100 MG tablet Take 100 mg by mouth every evening. Take on an empty stomach 1 hour before meals or 2 hours after      . Hydrocodone-Acetaminophen (VICODIN) 5-300 MG TABS Take 1 tablet by mouth 4 (four) times daily as needed (PAIN).  40 each  0  . loperamide (IMODIUM A-D) 2 MG tablet Take 2 mg by mouth 4 (four) times daily as needed for diarrhea or loose stools.      Marland Kitchen loratadine (CLARITIN) 10 MG tablet Take 10 mg by mouth daily as needed.       Marland Kitchen LORazepam (ATIVAN) 0.5 MG tablet Take 1 tablet (0.5 mg total) by  mouth every 8 (eight) hours as needed.  30 tablet  0  . Multiple Vitamin (MULTIVITAMIN) tablet Take 1 tablet by mouth daily.        . Omega-3 Fatty Acids (OMEGA-3 FISH OIL) 1200 MG CAPS Take 1 capsule by mouth daily.      Marland Kitchen omeprazole (PRILOSEC) 20 MG capsule Take 20 mg by mouth every morning.      Vladimir Faster Glycol-Propyl Glycol (SYSTANE OP) Place 1 drop into both eyes as needed. At night for dry eyes      . tiotropium (SPIRIVA) 18 MCG inhalation capsule Place 18 mcg into inhaler and inhale every morning.        No current facility-administered medications for this visit.    SURGICAL HISTORY:  Past Surgical History  Procedure Laterality Date  . Anal fissure repair  07/09/2011    INTERNAL SPHINCTEROTOMY  . Knee arthroscopy Right 1999  . Thumb arthroscopy Left     - removed bone spur  . Thoracoscopy  01-26-2009    w/   lung/  node biopsy's  . Benign excision left breast central duct  02/1999  . Vault suspension plus cystocele repair with graft  06-13-2010  . Transthoracic echocardiogram  12-23-2008    NORMAL LV/  EF 65-70%/  MILD MR  &  TR  . Total abdominal hysterectomy w/ bilateral salpingoophorectomy  1982    W/  APPENDECTOMY  . Tonsillectomy  AS CHILD  . Excision right wrist mass  2012  . Mass excision Right 03/11/2014    Procedure: RIGHT WRIST DEEP MASS EXCISION WITH CULTURE AND BIOSPY;  Surgeon: Linna Hoff, MD;  Location: [facility];  Service: Orthopedics;  Laterality: Right;    REVIEW OF SYSTEMS:  Constitutional: negative Eyes: negative Ears, nose, mouth, throat, and face: negative Respiratory: negative Cardiovascular: negative Gastrointestinal: negative Genitourinary:negative Integument/breast: negative Hematologic/lymphatic: negative Musculoskeletal:negative Neurological: negative Behavioral/Psych: negative Endocrine: negative Allergic/Immunologic: negative   PHYSICAL EXAMINATION: General appearance: alert, cooperative and no distress Head:  Normocephalic, without obvious abnormality, atraumatic Neck: no adenopathy, no JVD, supple, symmetrical, trachea midline and thyroid not enlarged, symmetric, no tenderness/mass/nodules Lymph nodes: Cervical, supraclavicular, and axillary nodes normal. Resp: clear to auscultation bilaterally Cardio: regular rate and rhythm, S1, S2 normal, no murmur, click, rub or gallop GI: soft, non-tender; bowel sounds normal; no masses,  no organomegaly Extremities: extremities normal, atraumatic, no cyanosis or edema Neurologic: Alert and oriented X 3, normal strength and tone. Normal symmetric reflexes. Normal coordination and gait  ECOG PERFORMANCE STATUS: 1 - Symptomatic but completely ambulatory  There were no vitals taken for this visit.  LABORATORY DATA: Lab Results  Component Value Date   WBC 5.2 03/24/2014  HGB 12.9 03/24/2014   HCT 38.4 03/24/2014   MCV 92.7 03/24/2014   PLT 286 03/24/2014      Chemistry      Component Value Date/Time   NA 145 03/24/2014 1001   NA 140 01/06/2013 0920   NA 145 11/09/2011 1345   K 4.0 03/24/2014 1001   K 3.2* 01/06/2013 0920   K 3.9 11/09/2011 1345   CL 108* 06/03/2013 0939   CL 102 01/06/2013 0920   CL 104 11/09/2011 1345   CO2 25 03/24/2014 1001   CO2 27 01/06/2013 0920   CO2 28 11/09/2011 1345   BUN 19.3 03/24/2014 1001   BUN 16 01/06/2013 0920   BUN 20 11/09/2011 1345   CREATININE 0.8 03/24/2014 1001   CREATININE 0.92 01/06/2013 0920   CREATININE 0.6 11/09/2011 1345      Component Value Date/Time   CALCIUM 9.2 03/24/2014 1001   CALCIUM 9.5 01/06/2013 0920   CALCIUM 8.7 11/09/2011 1345   ALKPHOS 74 03/24/2014 1001   ALKPHOS 60 01/06/2013 0920   ALKPHOS 52 11/09/2011 1345   AST 24 03/24/2014 1001   AST 28 01/06/2013 0920   AST 20 11/09/2011 1345   ALT 31 03/24/2014 1001   ALT 20 01/06/2013 0920   ALT 21 11/09/2011 1345   BILITOT 0.93 03/24/2014 1001   BILITOT 1.2 01/06/2013 0920   BILITOT 0.60 11/09/2011 1345       RADIOGRAPHIC STUDIES: Ct Chest W  Contrast  03/24/2014   CLINICAL DATA:  Lung cancer diagnosed in 2010. History of chemotherapy and radiation therapy.  EXAM: CT CHEST WITH CONTRAST  TECHNIQUE: Multidetector CT imaging of the chest was performed during intravenous contrast administration.  CONTRAST:  OMNIPAQUE IOHEXOL 300 MG/ML  SOLN  COMPARISON:  CT chest 12/28/2013 and 09/25/2013.  FINDINGS: Moderate right pleural effusion seen on the comparison studies is not notably changed. There is no left pleural effusion or pericardial effusion. No axillary, hilar or mediastinal lymphadenopathy is identified. Calcific aortic and coronary atherosclerosis is again seen.  Changes of radiation fibrosis and volume loss in the medial aspect of the upper right chest is stable in appearance. The lungs are otherwise unremarkable.  Visualized upper abdomen demonstrates no focal abnormality. Subtle nodularity along the capsular surface of the left hepatic lobe is again seen and may be secondary to cirrhosis. No lytic or sclerotic bony lesion is identified with thoracic and lumbar spondylosis noted.  IMPRESSION: Negative for recurrent or metastatic disease.  No change in a moderate right pleural effusion.  Radiation fibrosis and volume loss medial right hemi thorax, unchanged.  Changes suggestive of early cirrhosis.   Electronically Signed   By: Drusilla Kanner M.D.   On: 03/24/2014 15:34   ASSESSMENT AND PLAN: This is a very pleasant 79 years old white female with history of stage IIIa non-small cell lung cancer status post concurrent chemoradiation and has been on treatment with Tarceva for the last 58 months with no significant evidence for disease progression. He is tolerating her treatment well. Her recent CT scan of the chest showed no evidence for disease progression. I discussed the scan results with the patient today.  I recommended for her to continue her current treatment with Tarceva 100 mg by mouth daily. She continues to have the persistent  skin rash on the right forearm and wrist but no clear etiology for it till now. She was advised to call immediately if she has any concerning symptoms in the interval.  The patient voices understanding  of current disease status and treatment options and is in agreement with the current care plan.  All questions were answered. The patient knows to call the clinic with any problems, questions or concerns. We can certainly see the patient much sooner if necessary.  Disclaimer: This note was dictated with voice recognition software. Similar sounding words can inadvertently be transcribed and may not be corrected upon review.

## 2014-03-30 NOTE — Telephone Encounter (Signed)
gv and pirnted appt sched and avs for pt for May

## 2014-03-31 ENCOUNTER — Other Ambulatory Visit: Payer: Self-pay

## 2014-03-31 DIAGNOSIS — Z1231 Encounter for screening mammogram for malignant neoplasm of breast: Secondary | ICD-10-CM

## 2014-04-01 ENCOUNTER — Encounter: Payer: Self-pay | Admitting: *Deleted

## 2014-04-01 NOTE — Progress Notes (Signed)
RECEIVED A FAX FROM [facility] CONCERNING A CONFIRMATION OF PRESCRIPTION SHIPMENT FOR Dana [name] ON 03/31/14.

## 2014-04-09 LAB — FUNGUS CULTURE W SMEAR
FUNGAL SMEAR: NONE SEEN
Fungal Smear: NONE SEEN

## 2014-04-23 LAB — AFB CULTURE WITH SMEAR (NOT AT ARMC)
ACID FAST SMEAR: NONE SEEN
ACID FAST SMEAR: NONE SEEN

## 2014-04-26 ENCOUNTER — Other Ambulatory Visit: Payer: Self-pay | Admitting: *Deleted

## 2014-04-26 NOTE — Telephone Encounter (Signed)
THIS REFILL REQUEST FOR TARCEVA WAS PLACED ON DR.MOHAMED'S DESK.

## 2014-04-27 ENCOUNTER — Other Ambulatory Visit (HOSPITAL_BASED_OUTPATIENT_CLINIC_OR_DEPARTMENT_OTHER): Payer: Medicare Other

## 2014-04-27 ENCOUNTER — Encounter: Payer: Self-pay | Admitting: Internal Medicine

## 2014-04-27 ENCOUNTER — Ambulatory Visit (HOSPITAL_BASED_OUTPATIENT_CLINIC_OR_DEPARTMENT_OTHER): Payer: Medicare Other | Admitting: Internal Medicine

## 2014-04-27 ENCOUNTER — Telehealth: Payer: Self-pay | Admitting: Internal Medicine

## 2014-04-27 VITALS — BP 108/66 | HR 92 | Temp 98.1°F | Resp 18 | Ht 64.0 in | Wt 128.6 lb

## 2014-04-27 DIAGNOSIS — C3490 Malignant neoplasm of unspecified part of unspecified bronchus or lung: Secondary | ICD-10-CM

## 2014-04-27 DIAGNOSIS — C3430 Malignant neoplasm of lower lobe, unspecified bronchus or lung: Secondary | ICD-10-CM

## 2014-04-27 DIAGNOSIS — C342 Malignant neoplasm of middle lobe, bronchus or lung: Secondary | ICD-10-CM

## 2014-04-27 LAB — COMPREHENSIVE METABOLIC PANEL (CC13)
ALT: 17 U/L (ref 0–55)
AST: 25 U/L (ref 5–34)
Albumin: 3.3 g/dL — ABNORMAL LOW (ref 3.5–5.0)
Alkaline Phosphatase: 60 U/L (ref 40–150)
Anion Gap: 9 mEq/L (ref 3–11)
BUN: 22.3 mg/dL (ref 7.0–26.0)
CO2: 25 mEq/L (ref 22–29)
CREATININE: 0.9 mg/dL (ref 0.6–1.1)
Calcium: 9.6 mg/dL (ref 8.4–10.4)
Chloride: 111 mEq/L — ABNORMAL HIGH (ref 98–109)
Glucose: 89 mg/dl (ref 70–140)
POTASSIUM: 4.2 meq/L (ref 3.5–5.1)
SODIUM: 145 meq/L (ref 136–145)
Total Bilirubin: 0.9 mg/dL (ref 0.20–1.20)
Total Protein: 5.9 g/dL — ABNORMAL LOW (ref 6.4–8.3)

## 2014-04-27 LAB — CBC WITH DIFFERENTIAL/PLATELET
BASO%: 0.6 % (ref 0.0–2.0)
Basophils Absolute: 0 10*3/uL (ref 0.0–0.1)
EOS%: 5.6 % (ref 0.0–7.0)
Eosinophils Absolute: 0.3 10*3/uL (ref 0.0–0.5)
HCT: 37.5 % (ref 34.8–46.6)
HGB: 12.6 g/dL (ref 11.6–15.9)
LYMPH#: 0.7 10*3/uL — AB (ref 0.9–3.3)
LYMPH%: 12.3 % — ABNORMAL LOW (ref 14.0–49.7)
MCH: 31 pg (ref 25.1–34.0)
MCHC: 33.6 g/dL (ref 31.5–36.0)
MCV: 92.5 fL (ref 79.5–101.0)
MONO#: 0.5 10*3/uL (ref 0.1–0.9)
MONO%: 8.9 % (ref 0.0–14.0)
NEUT#: 4.4 10*3/uL (ref 1.5–6.5)
NEUT%: 72.6 % (ref 38.4–76.8)
Platelets: 280 10*3/uL (ref 145–400)
RBC: 4.05 10*6/uL (ref 3.70–5.45)
RDW: 14 % (ref 11.2–14.5)
WBC: 6.1 10*3/uL (ref 3.9–10.3)

## 2014-04-27 MED ORDER — ERLOTINIB HCL 100 MG PO TABS: 100.0000 mg | ORAL_TABLET | Freq: Every evening | ORAL | Status: DC

## 2014-04-27 NOTE — Telephone Encounter (Signed)
per pof to sch appt-printed pt a sch

## 2014-04-27 NOTE — Progress Notes (Signed)
[facility] Telephone:([phone]   Fax:(336) [place], [place] [address]  DIAGNOSIS AND STAGE: : Stage IIIB (T2a, N3, M0) nonsmall cell lung cancer, adenocarcinoma, in a never smoker patient with positive EGFR mutation diagnosed in January 2010.   PRIOR THERAPY:  1) Status post concurrent chemoradiation with weekly carboplatin and paclitaxel; last dose given March 21, 2009.  2) Tarceva at 150 mg p.o. daily, status post approximately 48 months of treatment, discontinued secondary to persistent diarrhea.   CURRENT THERAPY: Tarceva at 100 mg p.o. Daily, started March 19 2013, status post 11 months of treatment.   CHEMOTHERAPY INTENT: Maintenance.  CURRENT # OF CHEMOTHERAPY CYCLES: 60 CURRENT ANTIEMETICS: Compazine  CURRENT SMOKING STATUS: Never smoker  ORAL CHEMOTHERAPY AND CONSENT: Tarceva  CURRENT BISPHOSPHONATES USE: None  PAIN MANAGEMENT: 5/10 low back pain and sciatica currently on naproxen.  NARCOTICS INDUCED CONSTIPATION: None  LIVING WILL AND CODE STATUS: Full code but no long-term resuscitation.   INTERVAL HISTORY: Dana [name] 79 y.o. female returns to the clinic today for followup visit. She is tolerating her current treatment with Tarceva fairly well with no significant adverse effects. She denied having any significant fatigue or weakness. The patient denied having any significant weight loss or night sweats. She has no chest pain, shortness of breath, cough or hemoptysis. She denied having any significant skin rash or diarrhea. She takes omeprazole as needed for GERD symptoms.  MEDICAL HISTORY: Past Medical History  Diagnosis Date  . Hemorrhoid   . COPD (chronic obstructive pulmonary disease)   . GERD (gastroesophageal reflux disease)   . History of lung cancer ONCOLOGIST--  DR [facility]--  LAST CT ,  NO RECURRENCE OR METS    DX JAN 2010 --  STAGE IIIA  NON-SMALL CELL  ADENOCARCINOMA (RIGHT MIDDLE LOBE)---  S/P  CHEMORADIATIO THERAPY  (COMPLETE 03-21-2009)  . History of anal fissures   . Arthritis     HANDS,  WRISTS  . Osteoporosis   . Right wrist pain     MASS  . Dyspnea on exertion   . Normal cardiac stress test     2007  PER PT  . Wears glasses   . Rash, skin     RIGHT FOREARM/ HAND    ALLERGIES:  is allergic to amoxicillin.  MEDICATIONS:  Current Outpatient Prescriptions  Medication Sig Dispense Refill  . aspirin EC 81 MG tablet Take 81 mg by mouth daily.      . calcium carbonate (OS-CAL) 600 MG TABS Take 600 mg by mouth 3 (three) times daily with meals.       . cholecalciferol (VITAMIN D) 1000 UNITS tablet Take 1,000 Units by mouth daily.       . Coenzyme Q10 (COQ10) 100 MG CAPS Take 1 capsule by mouth daily.       Marland Kitchen conjugated estrogens (PREMARIN) vaginal cream Place vaginally once a week.        . Cyanocobalamin (B-12) 1000 MCG CAPS Take 1,000 mcg by mouth 2 (two) times daily.       Marland Kitchen erlotinib (TARCEVA) 100 MG tablet Take 1 tablet (100 mg total) by mouth every evening. Take on an empty stomach 1 hour before meals or 2 hours after  30 tablet  1  . Hydrocodone-Acetaminophen (VICODIN) 5-300 MG TABS Take 1 tablet by mouth 4 (four) times daily as needed (PAIN).  40 each  0  . loperamide (IMODIUM A-D)  2 MG tablet Take 2 mg by mouth 4 (four) times daily as needed for diarrhea or loose stools.      Marland Kitchen loratadine (CLARITIN) 10 MG tablet Take 10 mg by mouth daily as needed.       Marland Kitchen LORazepam (ATIVAN) 0.5 MG tablet Take 1 tablet (0.5 mg total) by mouth every 8 (eight) hours as needed.  30 tablet  0  . Multiple Vitamin (MULTIVITAMIN) tablet Take 1 tablet by mouth daily.        . Omega-3 Fatty Acids (OMEGA-3 FISH OIL) 1200 MG CAPS Take 1 capsule by mouth daily.      Marland Kitchen omeprazole (PRILOSEC) 20 MG capsule Take 20 mg by mouth every morning.      Vladimir Faster Glycol-Propyl Glycol (SYSTANE OP) Place 1 drop into both eyes as needed. At night for dry eyes        . tiotropium (SPIRIVA) 18 MCG inhalation capsule Place 18 mcg into inhaler and inhale every morning.       . docusate sodium (COLACE) 100 MG capsule Take 100 mg by mouth 2 (two) times daily as needed.       No current facility-administered medications for this visit.    SURGICAL HISTORY:  Past Surgical History  Procedure Laterality Date  . Anal fissure repair  07/09/2011    INTERNAL SPHINCTEROTOMY  . Knee arthroscopy Right 1999  . Thumb arthroscopy Left     - removed bone spur  . Thoracoscopy  01-26-2009    w/   lung/  node biopsy's  . Benign excision left breast central duct  02/1999  . Vault suspension plus cystocele repair with graft  06-13-2010  . Transthoracic echocardiogram  12-23-2008    NORMAL LV/  EF 65-70%/  MILD MR  &  TR  . Total abdominal hysterectomy w/ bilateral salpingoophorectomy  1982    W/  APPENDECTOMY  . Tonsillectomy  AS CHILD  . Excision right wrist mass  2012  . Mass excision Right 03/11/2014    Procedure: RIGHT WRIST DEEP MASS EXCISION WITH CULTURE AND BIOSPY;  Surgeon: Linna Hoff, MD;  Location: [facility];  Service: Orthopedics;  Laterality: Right;    REVIEW OF SYSTEMS:  Constitutional: negative Eyes: negative Ears, nose, mouth, throat, and face: negative Respiratory: negative Cardiovascular: negative Gastrointestinal: negative Genitourinary:negative Integument/breast: negative Hematologic/lymphatic: negative Musculoskeletal:negative Neurological: negative Behavioral/Psych: negative Endocrine: negative Allergic/Immunologic: negative   PHYSICAL EXAMINATION: General appearance: alert, cooperative and no distress Head: Normocephalic, without obvious abnormality, atraumatic Neck: no adenopathy, no JVD, supple, symmetrical, trachea midline and thyroid not enlarged, symmetric, no tenderness/mass/nodules Lymph nodes: Cervical, supraclavicular, and axillary nodes normal. Resp: clear to auscultation bilaterally Cardio: regular rate  and rhythm, S1, S2 normal, no murmur, click, rub or gallop GI: soft, non-tender; bowel sounds normal; no masses,  no organomegaly Extremities: extremities normal, atraumatic, no cyanosis or edema Neurologic: Alert and oriented X 3, normal strength and tone. Normal symmetric reflexes. Normal coordination and gait  ECOG PERFORMANCE STATUS: 1 - Symptomatic but completely ambulatory  Blood pressure 108/66, pulse 92, temperature 98.1 F (36.7 C), temperature source Oral, resp. rate 18, height $RemoveBe'5\' 4"'[id number]$  (1.626 m), weight 128 lb 9.6 oz (58.333 kg), SpO2 98.00%.  LABORATORY DATA: Lab Results  Component Value Date   WBC 6.1 04/27/2014   HGB 12.6 04/27/2014   HCT 37.5 04/27/2014   MCV 92.5 04/27/2014   PLT 280 04/27/2014      Chemistry      Component Value Date/Time  NA 145 04/27/2014 1009   NA 140 01/06/2013 0920   NA 145 11/09/2011 1345   K 4.2 04/27/2014 1009   K 3.2* 01/06/2013 0920   K 3.9 11/09/2011 1345   CL 108* 06/03/2013 0939   CL 102 01/06/2013 0920   CL 104 11/09/2011 1345   CO2 25 04/27/2014 1009   CO2 27 01/06/2013 0920   CO2 28 11/09/2011 1345   BUN 22.3 04/27/2014 1009   BUN 16 01/06/2013 0920   BUN 20 11/09/2011 1345   CREATININE 0.9 04/27/2014 1009   CREATININE 0.92 01/06/2013 0920   CREATININE 0.6 11/09/2011 1345      Component Value Date/Time   CALCIUM 9.6 04/27/2014 1009   CALCIUM 9.5 01/06/2013 0920   CALCIUM 8.7 11/09/2011 1345   ALKPHOS 60 04/27/2014 1009   ALKPHOS 60 01/06/2013 0920   ALKPHOS 52 11/09/2011 1345   AST 25 04/27/2014 1009   AST 28 01/06/2013 0920   AST 20 11/09/2011 1345   ALT 17 04/27/2014 1009   ALT 20 01/06/2013 0920   ALT 21 11/09/2011 1345   BILITOT 0.90 04/27/2014 1009   BILITOT 1.2 01/06/2013 0920   BILITOT 0.60 11/09/2011 1345       RADIOGRAPHIC STUDIES:  ASSESSMENT AND PLAN: This is a very pleasant 79 years old white female with history of stage IIIa non-small cell lung cancer status post concurrent chemoradiation and has been on treatment with Tarceva for the  last 59 months with no significant evidence for disease progression. He is tolerating her treatment well. I recommended for her to discontinue treatment with omeprazole at this point and to use Zantac or Pepcid as needed and has to be taken 2 hours after Tarceva and at least 10 hours before the next dose of Tarceva. I recommended for her to continue her current treatment with Tarceva 100 mg by mouth daily.  She was advised to call immediately if she has any concerning symptoms in the interval.  The patient voices understanding of current disease status and treatment options and is in agreement with the current care plan.  All questions were answered. The patient knows to call the clinic with any problems, questions or concerns. We can certainly see the patient much sooner if necessary.  Disclaimer: This note was dictated with voice recognition software. Similar sounding words can inadvertently be transcribed and may not be corrected upon review.

## 2014-05-03 ENCOUNTER — Ambulatory Visit
Admission: RE | Admit: 2014-05-03 | Discharge: 2014-05-03 | Disposition: A | Discharge status: Home / Self Care | Payer: Medicare Other | Source: Ambulatory Visit

## 2014-05-03 DIAGNOSIS — Z1231 Encounter for screening mammogram for malignant neoplasm of breast: Secondary | ICD-10-CM

## 2014-05-31 ENCOUNTER — Encounter: Payer: Self-pay | Admitting: Internal Medicine

## 2014-05-31 ENCOUNTER — Telehealth: Payer: Self-pay | Admitting: Internal Medicine

## 2014-05-31 ENCOUNTER — Ambulatory Visit (HOSPITAL_BASED_OUTPATIENT_CLINIC_OR_DEPARTMENT_OTHER): Payer: Medicare Other | Admitting: Internal Medicine

## 2014-05-31 ENCOUNTER — Other Ambulatory Visit (HOSPITAL_BASED_OUTPATIENT_CLINIC_OR_DEPARTMENT_OTHER): Payer: Medicare Other

## 2014-05-31 VITALS — BP 107/59 | HR 87 | Temp 98.0°F | Resp 20 | Ht 64.0 in | Wt 122.7 lb

## 2014-05-31 DIAGNOSIS — C342 Malignant neoplasm of middle lobe, bronchus or lung: Secondary | ICD-10-CM

## 2014-05-31 DIAGNOSIS — C3490 Malignant neoplasm of unspecified part of unspecified bronchus or lung: Secondary | ICD-10-CM

## 2014-05-31 LAB — CBC WITH DIFFERENTIAL/PLATELET
BASO%: 0.2 % (ref 0.0–2.0)
BASOS ABS: 0 10*3/uL (ref 0.0–0.1)
EOS%: 4.7 % (ref 0.0–7.0)
Eosinophils Absolute: 0.3 10*3/uL (ref 0.0–0.5)
HCT: 39.4 % (ref 34.8–46.6)
HEMOGLOBIN: 13 g/dL (ref 11.6–15.9)
LYMPH%: 11.1 % — ABNORMAL LOW (ref 14.0–49.7)
MCH: 30.9 pg (ref 25.1–34.0)
MCHC: 32.9 g/dL (ref 31.5–36.0)
MCV: 93.9 fL (ref 79.5–101.0)
MONO#: 0.7 10*3/uL (ref 0.1–0.9)
MONO%: 12.5 % (ref 0.0–14.0)
NEUT#: 3.9 10*3/uL (ref 1.5–6.5)
NEUT%: 71.5 % (ref 38.4–76.8)
Platelets: 276 10*3/uL (ref 145–400)
RBC: 4.2 10*6/uL (ref 3.70–5.45)
RDW: 14 % (ref 11.2–14.5)
WBC: 5.5 10*3/uL (ref 3.9–10.3)
lymph#: 0.6 10*3/uL — ABNORMAL LOW (ref 0.9–3.3)

## 2014-05-31 LAB — COMPREHENSIVE METABOLIC PANEL (CC13)
ALK PHOS: 53 U/L (ref 40–150)
ALT: 20 U/L (ref 0–55)
AST: 20 U/L (ref 5–34)
Albumin: 3.2 g/dL — ABNORMAL LOW (ref 3.5–5.0)
Anion Gap: 9 mEq/L (ref 3–11)
BUN: 16.7 mg/dL (ref 7.0–26.0)
CO2: 25 mEq/L (ref 22–29)
CREATININE: 1 mg/dL (ref 0.6–1.1)
Calcium: 9.1 mg/dL (ref 8.4–10.4)
Chloride: 109 mEq/L (ref 98–109)
Glucose: 82 mg/dl (ref 70–140)
POTASSIUM: 4.2 meq/L (ref 3.5–5.1)
Sodium: 144 mEq/L (ref 136–145)
Total Bilirubin: 1.03 mg/dL (ref 0.20–1.20)
Total Protein: 5.8 g/dL — ABNORMAL LOW (ref 6.4–8.3)

## 2014-05-31 NOTE — Progress Notes (Signed)
[place] [phone]   Fax:(336) [place], [place] [address]  DIAGNOSIS AND STAGE: : Stage IIIB (T2a, N3, M0) nonsmall cell lung cancer, adenocarcinoma, in a never smoker patient with positive EGFR mutation diagnosed in January 2010.   PRIOR THERAPY:  1) Status post concurrent chemoradiation with weekly carboplatin and paclitaxel; last dose given March 21, 2009.  2) Tarceva at 150 mg p.o. daily, status post approximately 48 months of treatment, discontinued secondary to persistent diarrhea.   CURRENT THERAPY: Tarceva at 100 mg p.o. Daily, started March 19 2013, status post 12 months of treatment.   CHEMOTHERAPY INTENT: Maintenance.  CURRENT # OF CHEMOTHERAPY CYCLES: 61 CURRENT ANTIEMETICS: Compazine  CURRENT SMOKING STATUS: Never smoker  ORAL CHEMOTHERAPY AND CONSENT: Tarceva  CURRENT BISPHOSPHONATES USE: None  PAIN MANAGEMENT: 5/10 low back pain and sciatica currently on naproxen.  NARCOTICS INDUCED CONSTIPATION: None  LIVING WILL AND CODE STATUS: Full code but no long-term resuscitation.   INTERVAL HISTORY: Dana [name] 79 y.o. female returns to the clinic today for followup visit. She is tolerating her current treatment with Tarceva fairly well with no significant adverse effects. She denied having any significant fatigue or weakness. The patient denied having any significant night sweats. She has no chest pain, shortness of breath, cough or hemoptysis. She denied having any significant skin rash or diarrhea. She has no bloating recently after discontinued omeprazole. She does not take Pepcid twice a day as scheduled. She lost few pounds since her last visit. She was also recently started on Plaquenil for suspicious arthritis in her right hand and some of these symptoms could be related to this medication.  MEDICAL HISTORY: Past Medical History  Diagnosis Date  . Hemorrhoid   .  COPD (chronic obstructive pulmonary disease)   . GERD (gastroesophageal reflux disease)   . History of lung cancer ONCOLOGIST--  DR [facility]--  LAST CT ,  NO RECURRENCE OR METS    DX JAN 2010 --  STAGE IIIA  NON-SMALL CELL ADENOCARCINOMA (RIGHT MIDDLE LOBE)---  S/P  CHEMORADIATIO THERAPY  (COMPLETE 03-21-2009)  . History of anal fissures   . Arthritis     HANDS,  WRISTS  . Osteoporosis   . Right wrist pain     MASS  . Dyspnea on exertion   . Normal cardiac stress test     2007  PER PT  . Wears glasses   . Rash, skin     RIGHT FOREARM/ HAND    ALLERGIES:  is allergic to amoxicillin.  MEDICATIONS:  Current Outpatient Prescriptions  Medication Sig Dispense Refill  . aspirin EC 81 MG tablet Take 81 mg by mouth daily.      . calcium carbonate (OS-CAL) 600 MG TABS Take 600 mg by mouth 3 (three) times daily with meals.       . cholecalciferol (VITAMIN D) 1000 UNITS tablet Take 1,000 Units by mouth daily.       . Coenzyme Q10 (COQ10) 100 MG CAPS Take 1 capsule by mouth daily.       Marland Kitchen conjugated estrogens (PREMARIN) vaginal cream Place vaginally once a week.        . Cyanocobalamin (B-12) 1000 MCG CAPS Take 1,000 mcg by mouth 2 (two) times daily.       Marland Kitchen erlotinib (TARCEVA) 100 MG tablet Take 1 tablet (100 mg total) by mouth every evening. Take on an empty stomach 1 hour  before meals or 2 hours after  30 tablet  1  . Hydrocodone-Acetaminophen (VICODIN) 5-300 MG TABS Take 1 tablet by mouth 4 (four) times daily as needed (PAIN).  40 each  0  . hydroxychloroquine (PLAQUENIL) 200 MG tablet Take 200 mg by mouth daily.      Marland Kitchen loperamide (IMODIUM A-D) 2 MG tablet Take 2 mg by mouth 4 (four) times daily as needed for diarrhea or loose stools.      Marland Kitchen loratadine (CLARITIN) 10 MG tablet Take 10 mg by mouth daily as needed.       Marland Kitchen LORazepam (ATIVAN) 0.5 MG tablet Take 1 tablet (0.5 mg total) by mouth every 8 (eight) hours as needed.  30 tablet  0  . Multiple Vitamin (MULTIVITAMIN) tablet Take 1 tablet  by mouth daily.        . Omega-3 Fatty Acids (OMEGA-3 FISH OIL) 1200 MG CAPS Take 1 capsule by mouth daily.      Vladimir Faster Glycol-Propyl Glycol (SYSTANE OP) Place 1 drop into both eyes as needed. At night for dry eyes      . tiotropium (SPIRIVA) 18 MCG inhalation capsule Place 18 mcg into inhaler and inhale every morning.        No current facility-administered medications for this visit.    SURGICAL HISTORY:  Past Surgical History  Procedure Laterality Date  . Anal fissure repair  07/09/2011    INTERNAL SPHINCTEROTOMY  . Knee arthroscopy Right 1999  . Thumb arthroscopy Left     - removed bone spur  . Thoracoscopy  01-26-2009    w/   lung/  node biopsy's  . Benign excision left breast central duct  02/1999  . Vault suspension plus cystocele repair with graft  06-13-2010  . Transthoracic echocardiogram  12-23-2008    NORMAL LV/  EF 65-70%/  MILD MR  &  TR  . Total abdominal hysterectomy w/ bilateral salpingoophorectomy  1982    W/  APPENDECTOMY  . Tonsillectomy  AS CHILD  . Excision right wrist mass  2012  . Mass excision Right 03/11/2014    Procedure: RIGHT WRIST DEEP MASS EXCISION WITH CULTURE AND BIOSPY;  Surgeon: Linna Hoff, MD;  Location: [place];  Service: Orthopedics;  Laterality: Right;    REVIEW OF SYSTEMS:  Constitutional: negative Eyes: negative Ears, nose, mouth, throat, and face: negative Respiratory: negative Cardiovascular: negative Gastrointestinal: negative Genitourinary:negative Integument/breast: negative Hematologic/lymphatic: negative Musculoskeletal:negative Neurological: negative Behavioral/Psych: negative Endocrine: negative Allergic/Immunologic: negative   PHYSICAL EXAMINATION: General appearance: alert, cooperative and no distress Head: Normocephalic, without obvious abnormality, atraumatic Neck: no adenopathy, no JVD, supple, symmetrical, trachea midline and thyroid not enlarged, symmetric, no  tenderness/mass/nodules Lymph nodes: Cervical, supraclavicular, and axillary nodes normal. Resp: clear to auscultation bilaterally Cardio: regular rate and rhythm, S1, S2 normal, no murmur, click, rub or gallop GI: soft, non-tender; bowel sounds normal; no masses,  no organomegaly Extremities: extremities normal, atraumatic, no cyanosis or edema Neurologic: Alert and oriented X 3, normal strength and tone. Normal symmetric reflexes. Normal coordination and gait  ECOG PERFORMANCE STATUS: 1 - Symptomatic but completely ambulatory  Blood pressure 107/59, pulse 87, temperature 98 F (36.7 C), temperature source Oral, resp. rate 20, height $RemoveBe'5\' 4"'[id number]$  (1.626 m), weight 122 lb 11.2 oz (55.656 kg).  LABORATORY DATA: Lab Results  Component Value Date   WBC 5.5 05/31/2014   HGB 13.0 05/31/2014   HCT 39.4 05/31/2014   MCV 93.9 05/31/2014   PLT 276 05/31/2014  Chemistry      Component Value Date/Time   NA 145 04/27/2014 1009   NA 140 01/06/2013 0920   NA 145 11/09/2011 1345   K 4.2 04/27/2014 1009   K 3.2* 01/06/2013 0920   K 3.9 11/09/2011 1345   CL 108* 06/03/2013 0939   CL 102 01/06/2013 0920   CL 104 11/09/2011 1345   CO2 25 04/27/2014 1009   CO2 27 01/06/2013 0920   CO2 28 11/09/2011 1345   BUN 22.3 04/27/2014 1009   BUN 16 01/06/2013 0920   BUN 20 11/09/2011 1345   CREATININE 0.9 04/27/2014 1009   CREATININE 0.92 01/06/2013 0920   CREATININE 0.6 11/09/2011 1345      Component Value Date/Time   CALCIUM 9.6 04/27/2014 1009   CALCIUM 9.5 01/06/2013 0920   CALCIUM 8.7 11/09/2011 1345   ALKPHOS 60 04/27/2014 1009   ALKPHOS 60 01/06/2013 0920   ALKPHOS 52 11/09/2011 1345   AST 25 04/27/2014 1009   AST 28 01/06/2013 0920   AST 20 11/09/2011 1345   ALT 17 04/27/2014 1009   ALT 20 01/06/2013 0920   ALT 21 11/09/2011 1345   BILITOT 0.90 04/27/2014 1009   BILITOT 1.2 01/06/2013 0920   BILITOT 0.60 11/09/2011 1345       RADIOGRAPHIC STUDIES:  ASSESSMENT AND PLAN: This is a very pleasant 79 years old white  female with history of stage IIIa non-small cell lung cancer status post concurrent chemoradiation and has been on treatment with Tarceva for the last 60 months with no significant evidence for disease progression. He is tolerating her treatment well. I recommended for her to take the Pepcid twice a day as previously recommended. I recommended for her to continue her current treatment with Tarceva 100 mg by mouth daily. She will come back for followup visit in one month for reevaluation after repeating CT scan of the chest. She was advised to call immediately if she has any concerning symptoms in the interval.  The patient voices understanding of current disease status and treatment options and is in agreement with the current care plan.  All questions were answered. The patient knows to call the clinic with any problems, questions or concerns. We can certainly see the patient much sooner if necessary.  Disclaimer: This note was dictated with voice recognition software. Similar sounding words can inadvertently be transcribed and may not be corrected upon review.

## 2014-05-31 NOTE — Telephone Encounter (Signed)
gv adn printed appt sched na davs for ptf or July

## 2014-06-28 ENCOUNTER — Ambulatory Visit (HOSPITAL_COMMUNITY)
Admission: RE | Admit: 2014-06-28 | Discharge: 2014-06-28 | Disposition: A | Discharge status: Home / Self Care | Payer: Medicare Other | Source: Ambulatory Visit | Attending: Internal Medicine | Admitting: Internal Medicine

## 2014-06-28 ENCOUNTER — Other Ambulatory Visit (HOSPITAL_BASED_OUTPATIENT_CLINIC_OR_DEPARTMENT_OTHER): Payer: Medicare Other

## 2014-06-28 ENCOUNTER — Other Ambulatory Visit: Payer: Self-pay | Admitting: *Deleted

## 2014-06-28 ENCOUNTER — Other Ambulatory Visit: Payer: Self-pay | Admitting: Medical Oncology

## 2014-06-28 ENCOUNTER — Encounter (HOSPITAL_COMMUNITY): Payer: Self-pay

## 2014-06-28 DIAGNOSIS — C3490 Malignant neoplasm of unspecified part of unspecified bronchus or lung: Secondary | ICD-10-CM

## 2014-06-28 DIAGNOSIS — J90 Pleural effusion, not elsewhere classified: Secondary | ICD-10-CM | POA: Insufficient documentation

## 2014-06-28 DIAGNOSIS — Z9221 Personal history of antineoplastic chemotherapy: Secondary | ICD-10-CM | POA: Insufficient documentation

## 2014-06-28 DIAGNOSIS — Y842 Radiological procedure and radiotherapy as the cause of abnormal reaction of the patient, or of later complication, without mention of misadventure at the time of the procedure: Secondary | ICD-10-CM | POA: Insufficient documentation

## 2014-06-28 DIAGNOSIS — C342 Malignant neoplasm of middle lobe, bronchus or lung: Secondary | ICD-10-CM

## 2014-06-28 DIAGNOSIS — R0602 Shortness of breath: Secondary | ICD-10-CM | POA: Insufficient documentation

## 2014-06-28 DIAGNOSIS — Z85118 Personal history of other malignant neoplasm of bronchus and lung: Secondary | ICD-10-CM | POA: Insufficient documentation

## 2014-06-28 DIAGNOSIS — T66XXXA Radiation sickness, unspecified, initial encounter: Secondary | ICD-10-CM | POA: Insufficient documentation

## 2014-06-28 LAB — CBC WITH DIFFERENTIAL/PLATELET
BASO%: 0.5 % (ref 0.0–2.0)
BASOS ABS: 0 10*3/uL (ref 0.0–0.1)
EOS ABS: 0.3 10*3/uL (ref 0.0–0.5)
EOS%: 5.3 % (ref 0.0–7.0)
HCT: 40.4 % (ref 34.8–46.6)
HEMOGLOBIN: 13.3 g/dL (ref 11.6–15.9)
LYMPH%: 10.5 % — AB (ref 14.0–49.7)
MCH: 30.8 pg (ref 25.1–34.0)
MCHC: 32.8 g/dL (ref 31.5–36.0)
MCV: 93.8 fL (ref 79.5–101.0)
MONO#: 0.4 10*3/uL (ref 0.1–0.9)
MONO%: 8.8 % (ref 0.0–14.0)
NEUT%: 74.9 % (ref 38.4–76.8)
NEUTROS ABS: 3.6 10*3/uL (ref 1.5–6.5)
PLATELETS: 250 10*3/uL (ref 145–400)
RBC: 4.31 10*6/uL (ref 3.70–5.45)
RDW: 14.3 % (ref 11.2–14.5)
WBC: 4.8 10*3/uL (ref 3.9–10.3)
lymph#: 0.5 10*3/uL — ABNORMAL LOW (ref 0.9–3.3)

## 2014-06-28 LAB — COMPREHENSIVE METABOLIC PANEL (CC13)
ALBUMIN: 3.1 g/dL — AB (ref 3.5–5.0)
ALK PHOS: 48 U/L (ref 40–150)
ALT: 24 U/L (ref 0–55)
AST: 24 U/L (ref 5–34)
Anion Gap: 6 mEq/L (ref 3–11)
BILIRUBIN TOTAL: 0.92 mg/dL (ref 0.20–1.20)
BUN: 13.8 mg/dL (ref 7.0–26.0)
CO2: 27 mEq/L (ref 22–29)
Calcium: 8.8 mg/dL (ref 8.4–10.4)
Chloride: 106 mEq/L (ref 98–109)
Creatinine: 0.8 mg/dL (ref 0.6–1.1)
GLUCOSE: 99 mg/dL (ref 70–140)
Potassium: 3.9 mEq/L (ref 3.5–5.1)
Sodium: 139 mEq/L (ref 136–145)
Total Protein: 5.7 g/dL — ABNORMAL LOW (ref 6.4–8.3)

## 2014-06-28 MED ORDER — ERLOTINIB HCL 100 MG PO TABS: 100.0000 mg | ORAL_TABLET | Freq: Every evening | ORAL | Status: DC

## 2014-06-28 MED ORDER — IOHEXOL 300 MG/ML  SOLN
80.0000 mL | Freq: Once | INTRAMUSCULAR | Status: AC | PRN
  Administered 2014-06-28: 80 mL via INTRAVENOUS

## 2014-06-28 NOTE — Telephone Encounter (Signed)
THIS REFILL REQUEST FOR TARCEVA WAS GIVEN TO DR.MOHAMED'S NURSE, DIANE BELL,RN.

## 2014-06-28 NOTE — Telephone Encounter (Signed)
Tarceva refill sent to Dr [name] for authorization.

## 2014-07-05 ENCOUNTER — Ambulatory Visit (HOSPITAL_BASED_OUTPATIENT_CLINIC_OR_DEPARTMENT_OTHER): Payer: Medicare Other | Admitting: Internal Medicine

## 2014-07-05 ENCOUNTER — Ambulatory Visit (HOSPITAL_COMMUNITY)
Admission: RE | Admit: 2014-07-05 | Discharge: 2014-07-05 | Disposition: A | Discharge status: Home / Self Care | Payer: Medicare Other | Source: Ambulatory Visit | Attending: Radiology | Admitting: Radiology

## 2014-07-05 ENCOUNTER — Encounter: Payer: Self-pay | Admitting: Internal Medicine

## 2014-07-05 ENCOUNTER — Ambulatory Visit (HOSPITAL_COMMUNITY)
Admission: RE | Admit: 2014-07-05 | Discharge: 2014-07-05 | Disposition: A | Discharge status: Home / Self Care | Payer: Medicare Other | Source: Ambulatory Visit | Attending: Internal Medicine | Admitting: Internal Medicine

## 2014-07-05 ENCOUNTER — Other Ambulatory Visit: Payer: Self-pay | Admitting: Radiology

## 2014-07-05 VITALS — BP 107/79

## 2014-07-05 VITALS — BP 98/70 | HR 92 | Temp 97.5°F | Resp 21 | Ht 64.0 in | Wt 120.5 lb

## 2014-07-05 DIAGNOSIS — R0602 Shortness of breath: Secondary | ICD-10-CM | POA: Insufficient documentation

## 2014-07-05 DIAGNOSIS — J9383 Other pneumothorax: Secondary | ICD-10-CM | POA: Insufficient documentation

## 2014-07-05 DIAGNOSIS — Z923 Personal history of irradiation: Secondary | ICD-10-CM | POA: Insufficient documentation

## 2014-07-05 DIAGNOSIS — C342 Malignant neoplasm of middle lobe, bronchus or lung: Secondary | ICD-10-CM

## 2014-07-05 DIAGNOSIS — J984 Other disorders of lung: Secondary | ICD-10-CM | POA: Insufficient documentation

## 2014-07-05 DIAGNOSIS — C3490 Malignant neoplasm of unspecified part of unspecified bronchus or lung: Secondary | ICD-10-CM

## 2014-07-05 DIAGNOSIS — Z902 Acquired absence of lung [part of]: Secondary | ICD-10-CM | POA: Insufficient documentation

## 2014-07-05 DIAGNOSIS — J90 Pleural effusion, not elsewhere classified: Secondary | ICD-10-CM | POA: Insufficient documentation

## 2014-07-05 DIAGNOSIS — Z9221 Personal history of antineoplastic chemotherapy: Secondary | ICD-10-CM | POA: Insufficient documentation

## 2014-07-05 DIAGNOSIS — J939 Pneumothorax, unspecified: Secondary | ICD-10-CM

## 2014-07-05 NOTE — Progress Notes (Signed)
Post right sided thoracentesis CXR revealed moderate-sized right pneumothorax. Given the known chronic right pleural effusion, this may be ex vacuo in nature. Recommended the patient stay and be monitored with a repeat chest x-ray in 1 hr to evaluate for progressive collapse. This has been discussed today with Dr. Laurence Ferrari. The above has been communicated with the patient who states understanding.  Tsosie Billing PA-C Interventional Radiology  07/05/14  12:09 PM

## 2014-07-05 NOTE — Procedures (Signed)
Successful US guided right thoracentesis. Yielded 550 ml of yellow milky fluid. Pt tolerated procedure well. No immediate complications.  Specimen was not sent for labs. CXR ordered.  Tsosie Billing D PA-C 07/05/2014 11:28 AM

## 2014-07-05 NOTE — Progress Notes (Signed)
[place] Telephone:([phone]   Fax:(336) [place], [place] [address]  DIAGNOSIS AND STAGE: : Stage IIIB (T2a, N3, M0) nonsmall cell lung cancer, adenocarcinoma, in a never smoker patient with positive EGFR mutation diagnosed in January 2010.   PRIOR THERAPY:  1) Status post concurrent chemoradiation with weekly carboplatin and paclitaxel; last dose given March 21, 2009.  2) Tarceva at 150 mg p.o. daily, status post approximately 48 months of treatment, discontinued secondary to persistent diarrhea.   CURRENT THERAPY: Tarceva at 100 mg p.o. Daily, started March 19 2013, status post 13 months of treatment.   CHEMOTHERAPY INTENT: Maintenance.  CURRENT # OF CHEMOTHERAPY CYCLES: 62 CURRENT ANTIEMETICS: Compazine  CURRENT SMOKING STATUS: Never smoker  ORAL CHEMOTHERAPY AND CONSENT: Tarceva  CURRENT BISPHOSPHONATES USE: None  PAIN MANAGEMENT: 5/10 low back pain and sciatica currently on naproxen.  NARCOTICS INDUCED CONSTIPATION: None  LIVING WILL AND CODE STATUS: Full code but no long-term resuscitation.   INTERVAL HISTORY: Dana [name] 79 y.o. female returns to the clinic today for followup visit. She is tolerating her current treatment with Tarceva fairly well with no significant adverse effects. She has fewer dizzy spells recently as well as shortness of breath with exertion. Her blood pressure has been on the lower side recently. She does not take any blood pressure medications but her fluid intake was low recently. She denied having any significant fatigue or weakness. The patient denied having any significant night sweats. She has no chest pain, cough or hemoptysis. She denied having any significant skin rash or diarrhea. She had repeat CT scan of the chest performed and she is here today for evaluation and discussion of her scan results.  MEDICAL HISTORY: Past Medical History  Diagnosis Date   . Hemorrhoid   . COPD (chronic obstructive pulmonary disease)   . GERD (gastroesophageal reflux disease)   . History of lung cancer ONCOLOGIST--  DR [facility]--  LAST CT ,  NO RECURRENCE OR METS    DX JAN 2010 --  STAGE IIIA  NON-SMALL CELL ADENOCARCINOMA (RIGHT MIDDLE LOBE)---  S/P  CHEMORADIATIO THERAPY  (COMPLETE 03-21-2009)  . History of anal fissures   . Arthritis     HANDS,  WRISTS  . Osteoporosis   . Right wrist pain     MASS  . Dyspnea on exertion   . Normal cardiac stress test     2007  PER PT  . Wears glasses   . Rash, skin     RIGHT FOREARM/ HAND  . Cancer     ALLERGIES:  is allergic to amoxicillin.  MEDICATIONS:  Current Outpatient Prescriptions  Medication Sig Dispense Refill  . aspirin EC 81 MG tablet Take 81 mg by mouth daily.      . calcium carbonate (OS-CAL) 600 MG TABS Take 600 mg by mouth 3 (three) times daily with meals.       . cholecalciferol (VITAMIN D) 1000 UNITS tablet Take 1,000 Units by mouth daily.       . Coenzyme Q10 (COQ10) 100 MG CAPS Take 1 capsule by mouth daily.       Marland Kitchen conjugated estrogens (PREMARIN) vaginal cream Place vaginally once a week.        . Cyanocobalamin (B-12) 1000 MCG CAPS Take 1,000 mcg by mouth 2 (two) times daily.       Marland Kitchen erlotinib (TARCEVA) 100 MG tablet Take 1 tablet (100 mg total)  by mouth every evening. Take on an empty stomach 1 hour before meals or 2 hours after  30 tablet  1  . Hydrocodone-Acetaminophen (VICODIN) 5-300 MG TABS Take 1 tablet by mouth 4 (four) times daily as needed (PAIN).  40 each  0  . hydroxychloroquine (PLAQUENIL) 200 MG tablet Take 200 mg by mouth daily.      Marland Kitchen loperamide (IMODIUM A-D) 2 MG tablet Take 2 mg by mouth 4 (four) times daily as needed for diarrhea or loose stools.      Marland Kitchen loratadine (CLARITIN) 10 MG tablet Take 10 mg by mouth daily as needed.       Marland Kitchen LORazepam (ATIVAN) 0.5 MG tablet Take 1 tablet (0.5 mg total) by mouth every 8 (eight) hours as needed.  30 tablet  0  . Multiple Vitamin  (MULTIVITAMIN) tablet Take 1 tablet by mouth daily.        . Omega-3 Fatty Acids (OMEGA-3 FISH OIL) 1200 MG CAPS Take 1 capsule by mouth daily.      Vladimir Faster Glycol-Propyl Glycol (SYSTANE OP) Place 1 drop into both eyes as needed. At night for dry eyes      . tiotropium (SPIRIVA) 18 MCG inhalation capsule Place 18 mcg into inhaler and inhale every morning.        No current facility-administered medications for this visit.    SURGICAL HISTORY:  Past Surgical History  Procedure Laterality Date  . Anal fissure repair  07/09/2011    INTERNAL SPHINCTEROTOMY  . Knee arthroscopy Right 1999  . Thumb arthroscopy Left     - removed bone spur  . Thoracoscopy  01-26-2009    w/   lung/  node biopsy's  . Benign excision left breast central duct  02/1999  . Vault suspension plus cystocele repair with graft  06-13-2010  . Transthoracic echocardiogram  12-23-2008    NORMAL LV/  EF 65-70%/  MILD MR  &  TR  . Total abdominal hysterectomy w/ bilateral salpingoophorectomy  1982    W/  APPENDECTOMY  . Tonsillectomy  AS CHILD  . Excision right wrist mass  2012  . Mass excision Right 03/11/2014    Procedure: RIGHT WRIST DEEP MASS EXCISION WITH CULTURE AND BIOSPY;  Surgeon: Linna Hoff, MD;  Location: [place];  Service: Orthopedics;  Laterality: Right;    REVIEW OF SYSTEMS:  Constitutional: negative Eyes: negative Ears, nose, mouth, throat, and face: negative Respiratory: positive for dyspnea on exertion Cardiovascular: negative Gastrointestinal: negative Genitourinary:negative Integument/breast: negative Hematologic/lymphatic: negative Musculoskeletal:negative Neurological: negative Behavioral/Psych: negative Endocrine: negative Allergic/Immunologic: negative   PHYSICAL EXAMINATION: General appearance: alert, cooperative and no distress Head: Normocephalic, without obvious abnormality, atraumatic Neck: no adenopathy, no JVD, supple, symmetrical, trachea midline and  thyroid not enlarged, symmetric, no tenderness/mass/nodules Lymph nodes: Cervical, supraclavicular, and axillary nodes normal. Resp: diminished breath sounds RLL and dullness to percussion RLL Cardio: regular rate and rhythm, S1, S2 normal, no murmur, click, rub or gallop GI: soft, non-tender; bowel sounds normal; no masses,  no organomegaly Extremities: extremities normal, atraumatic, no cyanosis or edema Neurologic: Alert and oriented X 3, normal strength and tone. Normal symmetric reflexes. Normal coordination and gait  ECOG PERFORMANCE STATUS: 1 - Symptomatic but completely ambulatory  Blood pressure 98/70, pulse 92, temperature 97.5 F (36.4 C), resp. rate 21, height $RemoveBe'5\' 4"'[id number]$  (1.626 m), weight 120 lb 8 oz (54.658 kg).  LABORATORY DATA: Lab Results  Component Value Date   WBC 4.8 06/28/2014   HGB 13.3 06/28/2014  HCT 40.4 06/28/2014   MCV 93.8 06/28/2014   PLT 250 06/28/2014      Chemistry      Component Value Date/Time   NA 139 06/28/2014 0919   NA 140 01/06/2013 0920   NA 145 11/09/2011 1345   K 3.9 06/28/2014 0919   K 3.2* 01/06/2013 0920   K 3.9 11/09/2011 1345   CL 108* 06/03/2013 0939   CL 102 01/06/2013 0920   CL 104 11/09/2011 1345   CO2 27 06/28/2014 0919   CO2 27 01/06/2013 0920   CO2 28 11/09/2011 1345   BUN 13.8 06/28/2014 0919   BUN 16 01/06/2013 0920   BUN 20 11/09/2011 1345   CREATININE 0.8 06/28/2014 0919   CREATININE 0.92 01/06/2013 0920   CREATININE 0.6 11/09/2011 1345      Component Value Date/Time   CALCIUM 8.8 06/28/2014 0919   CALCIUM 9.5 01/06/2013 0920   CALCIUM 8.7 11/09/2011 1345   ALKPHOS 48 06/28/2014 0919   ALKPHOS 60 01/06/2013 0920   ALKPHOS 52 11/09/2011 1345   AST 24 06/28/2014 0919   AST 28 01/06/2013 0920   AST 20 11/09/2011 1345   ALT 24 06/28/2014 0919   ALT 20 01/06/2013 0920   ALT 21 11/09/2011 1345   BILITOT 0.92 06/28/2014 0919   BILITOT 1.2 01/06/2013 0920   BILITOT 0.60 11/09/2011 1345       RADIOGRAPHIC STUDIES: Ct Chest W Contrast  06/28/2014    CLINICAL DATA:  Lung cancer diagnosed 2010, chemotherapy complete, shortness of breath with exercise  EXAM: CT CHEST WITH CONTRAST  TECHNIQUE: Multidetector CT imaging of the chest was performed during intravenous contrast administration.  CONTRAST:  35mL OMNIPAQUE IOHEXOL 300 MG/ML  SOLN  COMPARISON:  03/24/2014  FINDINGS: Right paramediastinal radiation changes with volume loss in the right lower lung. Moderate right pleural effusion, grossly unchanged.  No suspicious pulmonary nodules.  No pneumothorax.  Visualized thyroid is unremarkable.  The heart is normal in size. Stable trace fluid along the anterior pericardium (series 2/image 32).  No suspicious mediastinal, hilar, or axillary lymphadenopathy.  Visualized upper abdomen is notable for a mildly nodular hepatic contour, nonspecific.  Degenerative changes of the visualized thoracolumbar spine. Thoracic levoscoliosis.  IMPRESSION: Stable right paramediastinal radiation changes with volume loss in the right hemithorax.  Moderate right pleural effusion, unchanged.  No evidence of recurrent or metastatic disease in the chest.   Electronically Signed   By: Charline Bills M.D.   On: 06/28/2014 11:16   ASSESSMENT AND PLAN: This is a very pleasant 79 years old white female with history of stage IIIa non-small cell lung cancer status post concurrent chemoradiation and has been on treatment with Tarceva for the last 61 months with no significant evidence for disease progression.  She is tolerating her treatment well. Her recent CT scan of the chest showed no evidence for disease progression but the patient continues to have moderate size right pleural effusion. I discussed the scan results with the patient today. I recommended for her to proceed with ultrasound-guided thoracentesis for drainage of the right pleural effusion which in the past did improve her symptoms of the shortness of breath. I recommended for her to continue her current treatment with  Tarceva 100 mg by mouth daily. She would come back for followup visit in one month's for reevaluation with repeat blood work. She was advised to call immediately if she has any concerning symptoms in the interval.  The patient voices understanding of current disease status and  treatment options and is in agreement with the current care plan.  All questions were answered. The patient knows to call the clinic with any problems, questions or concerns. We can certainly see the patient much sooner if necessary.  Disclaimer: This note was dictated with voice recognition software. Similar sounding words can inadvertently be transcribed and may not be corrected upon review.

## 2014-07-05 NOTE — Progress Notes (Signed)
Patient presented today for right sided therapeutic thoracentesis with c/o shortness of breath. Patient underwent the procedure well and states she is able to finally take a deep breath easier. Post procedure CXR revealed moderate sized PTX, given the known chronic right pleural effusion by review of previous CT chest from this month and dating back to the year prior, this may be ex vacuo in nature. Dr. Laurence Ferrari reviewed the images and recommended the patient stay to be monitored with oxygen 2L [place] placed and a repeat chest x-ray in 1 hr to evaluate for progressive collapse. A 1 hour follow up CXR revealed unchanged, stable right moderate PTX. The patient's oxygen saturation remained 100% on 2L and also on RA. She denies any right sided chest or back pain, shortness of breath or difficulty taking a deep breath. She denies any lightheadedness or dizziness. I have discussed the above with Dr. Laurence Ferrari,  imaging studies and the patient's vitals and lack of symptoms are consistent with an ex vacuo as the pleural lining may have developed some scarring overtime and is likely stuck. This lack of expansion does make the space more susceptible to fluid re accumulation overtime. She was discharged home and encouraged to use her inspiratory spirometer that she has at home. She was instructed to present to the ED if she develops any chest pain, shortness of breath or change in baseline. IR PA will contact the patient tomorrow at the number given [phone]. She will follow up as an outpatient 07/09/14 to [facility] for a CXR and evaluation. I have discussed all of the above with the patient who states understanding.  Tsosie Billing PA-C Interventional Radiology  07/05/14  1:52 PM

## 2014-07-06 ENCOUNTER — Telehealth: Payer: Self-pay | Admitting: Radiology

## 2014-07-06 NOTE — Progress Notes (Signed)
I have called the patient this morning to see how she feels today after her thoracentesis 7/13 and post procedure PTX felt to be ex vacuo in nature. She denies any worsening shortness of breath or chest pain. She does admit to some fatigue, but states her breathing is still much improved compared to before the procedure.   Tsosie Billing PA-C Interventional Radiology  07/06/2014 8:30 AM

## 2014-07-08 ENCOUNTER — Telehealth: Payer: Self-pay | Admitting: Internal Medicine

## 2014-07-08 NOTE — Telephone Encounter (Signed)
s.w pt and gv pt appt sched and avs for pt for Aug...pt need 10 oclock time...done...pt already had u.s.

## 2014-07-09 ENCOUNTER — Ambulatory Visit (HOSPITAL_COMMUNITY)
Admission: RE | Admit: 2014-07-09 | Discharge: 2014-07-09 | Disposition: A | Discharge status: Home / Self Care | Payer: Medicare Other | Source: Ambulatory Visit | Attending: Radiology | Admitting: Radiology

## 2014-07-09 DIAGNOSIS — J939 Pneumothorax, unspecified: Secondary | ICD-10-CM

## 2014-07-09 DIAGNOSIS — J90 Pleural effusion, not elsewhere classified: Secondary | ICD-10-CM | POA: Insufficient documentation

## 2014-07-13 ENCOUNTER — Emergency Department (HOSPITAL_COMMUNITY): Payer: Medicare Other

## 2014-07-13 ENCOUNTER — Encounter (HOSPITAL_COMMUNITY): Payer: Self-pay | Admitting: Emergency Medicine

## 2014-07-13 ENCOUNTER — Inpatient Hospital Stay (HOSPITAL_COMMUNITY)
Admission: EM | Admit: 2014-07-13 | Discharge: 2014-07-15 | DRG: 200 | Disposition: A | Discharge status: Home / Self Care | Payer: Medicare Other | Attending: Family Medicine | Admitting: Family Medicine

## 2014-07-13 DIAGNOSIS — J90 Pleural effusion, not elsewhere classified: Secondary | ICD-10-CM | POA: Diagnosis present

## 2014-07-13 DIAGNOSIS — R0603 Acute respiratory distress: Secondary | ICD-10-CM | POA: Diagnosis present

## 2014-07-13 DIAGNOSIS — Z87891 Personal history of nicotine dependence: Secondary | ICD-10-CM | POA: Diagnosis not present

## 2014-07-13 DIAGNOSIS — Z7982 Long term (current) use of aspirin: Secondary | ICD-10-CM | POA: Diagnosis not present

## 2014-07-13 DIAGNOSIS — J9383 Other pneumothorax: Secondary | ICD-10-CM

## 2014-07-13 DIAGNOSIS — J939 Pneumothorax, unspecified: Secondary | ICD-10-CM | POA: Diagnosis present

## 2014-07-13 DIAGNOSIS — J438 Other emphysema: Secondary | ICD-10-CM | POA: Diagnosis present

## 2014-07-13 DIAGNOSIS — C3491 Malignant neoplasm of unspecified part of right bronchus or lung: Secondary | ICD-10-CM | POA: Diagnosis present

## 2014-07-13 DIAGNOSIS — C3490 Malignant neoplasm of unspecified part of unspecified bronchus or lung: Secondary | ICD-10-CM | POA: Diagnosis present

## 2014-07-13 DIAGNOSIS — K219 Gastro-esophageal reflux disease without esophagitis: Secondary | ICD-10-CM | POA: Diagnosis present

## 2014-07-13 DIAGNOSIS — R0600 Dyspnea, unspecified: Secondary | ICD-10-CM

## 2014-07-13 DIAGNOSIS — M129 Arthropathy, unspecified: Secondary | ICD-10-CM | POA: Diagnosis present

## 2014-07-13 DIAGNOSIS — J910 Malignant pleural effusion: Secondary | ICD-10-CM

## 2014-07-13 DIAGNOSIS — Z881 Allergy status to other antibiotic agents status: Secondary | ICD-10-CM

## 2014-07-13 DIAGNOSIS — J95811 Postprocedural pneumothorax: Principal | ICD-10-CM

## 2014-07-13 DIAGNOSIS — R0989 Other specified symptoms and signs involving the circulatory and respiratory systems: Secondary | ICD-10-CM

## 2014-07-13 DIAGNOSIS — Z79899 Other long term (current) drug therapy: Secondary | ICD-10-CM | POA: Diagnosis not present

## 2014-07-13 DIAGNOSIS — R0609 Other forms of dyspnea: Secondary | ICD-10-CM

## 2014-07-13 DIAGNOSIS — Z66 Do not resuscitate: Secondary | ICD-10-CM | POA: Diagnosis present

## 2014-07-13 DIAGNOSIS — R0602 Shortness of breath: Secondary | ICD-10-CM

## 2014-07-13 LAB — CBC WITH DIFFERENTIAL/PLATELET
Basophils Absolute: 0 10*3/uL (ref 0.0–0.1)
Basophils Relative: 0 % (ref 0–1)
Eosinophils Absolute: 0.4 10*3/uL (ref 0.0–0.7)
Eosinophils Relative: 7 % — ABNORMAL HIGH (ref 0–5)
HCT: 35.9 % — ABNORMAL LOW (ref 36.0–46.0)
HEMOGLOBIN: 12.4 g/dL (ref 12.0–15.0)
LYMPHS ABS: 0.7 10*3/uL (ref 0.7–4.0)
LYMPHS PCT: 11 % — AB (ref 12–46)
MCH: 30.8 pg (ref 26.0–34.0)
MCHC: 34.5 g/dL (ref 30.0–36.0)
MCV: 89.1 fL (ref 78.0–100.0)
MONOS PCT: 10 % (ref 3–12)
Monocytes Absolute: 0.6 10*3/uL (ref 0.1–1.0)
NEUTROS PCT: 72 % (ref 43–77)
Neutro Abs: 4.5 10*3/uL (ref 1.7–7.7)
Platelets: 329 10*3/uL (ref 150–400)
RBC: 4.03 MIL/uL (ref 3.87–5.11)
RDW: 13.8 % (ref 11.5–15.5)
WBC: 6.2 10*3/uL (ref 4.0–10.5)

## 2014-07-13 LAB — CREATININE, SERUM
Creatinine, Ser: 0.84 mg/dL (ref 0.50–1.10)
GFR calc Af Amer: 76 mL/min — ABNORMAL LOW (ref >90)
GFR, EST NON AFRICAN AMERICAN: 65 mL/min — AB (ref >90)

## 2014-07-13 LAB — CBC
HCT: 39.2 % (ref 36.0–46.0)
HEMATOCRIT: 35 % — AB (ref 36.0–46.0)
Hemoglobin: 11.9 g/dL — ABNORMAL LOW (ref 12.0–15.0)
Hemoglobin: 13.1 g/dL (ref 12.0–15.0)
MCH: 30.6 pg (ref 26.0–34.0)
MCH: 30.3 pg (ref 26.0–34.0)
MCHC: 34 g/dL (ref 30.0–36.0)
MCHC: 33.4 g/dL (ref 30.0–36.0)
MCV: 90 fL (ref 78.0–100.0)
MCV: 90.7 fL (ref 78.0–100.0)
Platelets: 308 10*3/uL (ref 150–400)
Platelets: 346 10*3/uL (ref 150–400)
RBC: 3.89 MIL/uL (ref 3.87–5.11)
RBC: 4.32 MIL/uL (ref 3.87–5.11)
RDW: 14 % (ref 11.5–15.5)
RDW: 14 % (ref 11.5–15.5)
WBC: 5.7 10*3/uL (ref 4.0–10.5)
WBC: 6.8 10*3/uL (ref 4.0–10.5)

## 2014-07-13 LAB — COMPREHENSIVE METABOLIC PANEL
ALT: 23 U/L (ref 0–35)
AST: 24 U/L (ref 0–37)
Albumin: 3.2 g/dL — ABNORMAL LOW (ref 3.5–5.2)
Alkaline Phosphatase: 60 U/L (ref 39–117)
Anion gap: 16 — ABNORMAL HIGH (ref 5–15)
BUN: 15 mg/dL (ref 6–23)
CO2: 26 mEq/L (ref 19–32)
Calcium: 9.1 mg/dL (ref 8.4–10.5)
Chloride: 103 mEq/L (ref 96–112)
Creatinine, Ser: 0.84 mg/dL (ref 0.50–1.10)
GFR calc Af Amer: 76 mL/min — ABNORMAL LOW (ref >90)
GFR calc non Af Amer: 65 mL/min — ABNORMAL LOW (ref >90)
Glucose, Bld: 131 mg/dL — ABNORMAL HIGH (ref 70–99)
Potassium: 4.2 mEq/L (ref 3.7–5.3)
Sodium: 145 mEq/L (ref 137–147)
Total Bilirubin: 0.9 mg/dL (ref 0.3–1.2)
Total Protein: 6.5 g/dL (ref 6.0–8.3)

## 2014-07-13 LAB — PROTIME-INR
INR: 1.09 (ref 0.00–1.49)
Prothrombin Time: 14.1 seconds (ref 11.6–15.2)

## 2014-07-13 LAB — I-STAT TROPONIN, ED: Troponin i, poc: 0.02 ng/mL (ref 0.00–0.08)

## 2014-07-13 LAB — BASIC METABOLIC PANEL
Anion gap: 13 (ref 5–15)
BUN: 18 mg/dL (ref 6–23)
CHLORIDE: 104 meq/L (ref 96–112)
CO2: 23 mEq/L (ref 19–32)
Calcium: 9.3 mg/dL (ref 8.4–10.5)
Creatinine, Ser: 0.98 mg/dL (ref 0.50–1.10)
GFR calc non Af Amer: 54 mL/min — ABNORMAL LOW (ref >90)
GFR, EST AFRICAN AMERICAN: 63 mL/min — AB (ref >90)
GLUCOSE: 85 mg/dL (ref 70–99)
POTASSIUM: 4.3 meq/L (ref 3.7–5.3)
Sodium: 140 mEq/L (ref 137–147)

## 2014-07-13 LAB — APTT: aPTT: 26 seconds (ref 24–37)

## 2014-07-13 MED ORDER — ERLOTINIB HCL 100 MG PO TABS
100.0000 mg | ORAL_TABLET | ORAL | Status: DC
  Administered 2014-07-14: 100 mg via ORAL
  Filled 2014-07-13 (×2): qty 1

## 2014-07-13 MED ORDER — MORPHINE SULFATE 2 MG/ML IJ SOLN: 2.0000 mg | INTRAMUSCULAR | Status: DC | PRN

## 2014-07-13 MED ORDER — ERLOTINIB HCL 100 MG PO TABS: 100.0000 mg | ORAL_TABLET | ORAL | Status: DC

## 2014-07-13 MED ORDER — ACETAMINOPHEN 650 MG RE SUPP: 650.0000 mg | Freq: Four times a day (QID) | RECTAL | Status: DC | PRN

## 2014-07-13 MED ORDER — ONDANSETRON HCL 4 MG/2ML IJ SOLN: 4.0000 mg | Freq: Four times a day (QID) | INTRAMUSCULAR | Status: DC | PRN

## 2014-07-13 MED ORDER — HEPARIN SODIUM (PORCINE) 5000 UNIT/ML IJ SOLN
5000.0000 [IU] | Freq: Three times a day (TID) | INTRAMUSCULAR | Status: DC
  Filled 2014-07-13 (×8): qty 1

## 2014-07-13 MED ORDER — ERLOTINIB HCL 100 MG PO TABS: 100.0000 mg | ORAL_TABLET | Freq: Every evening | ORAL | Status: DC

## 2014-07-13 MED ORDER — ALUM & MAG HYDROXIDE-SIMETH 200-200-20 MG/5ML PO SUSP: 30.0000 mL | Freq: Four times a day (QID) | ORAL | Status: DC | PRN

## 2014-07-13 MED ORDER — HYDROXYCHLOROQUINE SULFATE 200 MG PO TABS
400.0000 mg | ORAL_TABLET | Freq: Every morning | ORAL | Status: DC
  Administered 2014-07-15: 400 mg via ORAL
  Filled 2014-07-13 (×2): qty 2

## 2014-07-13 MED ORDER — LORAZEPAM 0.5 MG PO TABS: 0.5000 mg | ORAL_TABLET | Freq: Three times a day (TID) | ORAL | Status: DC | PRN

## 2014-07-13 MED ORDER — SODIUM CHLORIDE 0.9 % IJ SOLN
3.0000 mL | Freq: Two times a day (BID) | INTRAMUSCULAR | Status: DC
  Administered 2014-07-14: 3 mL via INTRAVENOUS

## 2014-07-13 MED ORDER — SODIUM CHLORIDE 0.9 % IV SOLN
INTRAVENOUS | Status: DC
  Administered 2014-07-13: 18:00:00 via INTRAVENOUS

## 2014-07-13 MED ORDER — ONDANSETRON HCL 4 MG PO TABS
4.0000 mg | ORAL_TABLET | Freq: Four times a day (QID) | ORAL | Status: DC | PRN
  Administered 2014-07-14: 4 mg via ORAL
  Filled 2014-07-13: qty 1

## 2014-07-13 MED ORDER — VANCOMYCIN HCL IN DEXTROSE 1-5 GM/200ML-% IV SOLN
1000.0000 mg | INTRAVENOUS | Status: AC
  Administered 2014-07-14: 1000 mg via INTRAVENOUS
  Filled 2014-07-13: qty 200

## 2014-07-13 MED ORDER — OXYCODONE HCL 5 MG PO TABS: 5.0000 mg | ORAL_TABLET | ORAL | Status: DC | PRN

## 2014-07-13 MED ORDER — ERLOTINIB HCL 100 MG PO TABS
100.0000 mg | ORAL_TABLET | Freq: Once | ORAL | Status: AC
  Administered 2014-07-13: 100 mg via ORAL

## 2014-07-13 MED ORDER — ACETAMINOPHEN 325 MG PO TABS: 650.0000 mg | ORAL_TABLET | Freq: Four times a day (QID) | ORAL | Status: DC | PRN

## 2014-07-13 MED ORDER — TIOTROPIUM BROMIDE MONOHYDRATE 18 MCG IN CAPS
18.0000 ug | ORAL_CAPSULE | Freq: Every morning | RESPIRATORY_TRACT | Status: DC
  Administered 2014-07-15: 18 ug via RESPIRATORY_TRACT
  Filled 2014-07-13: qty 5

## 2014-07-13 MED ORDER — ASPIRIN EC 81 MG PO TBEC
81.0000 mg | DELAYED_RELEASE_TABLET | Freq: Every day | ORAL | Status: DC
  Administered 2014-07-15: 81 mg via ORAL
  Filled 2014-07-13 (×2): qty 1

## 2014-07-13 NOTE — H&P (Signed)
Addendum: Patient clarified she is a DO NOT RESUSCITATE

## 2014-07-13 NOTE — Consult Note (Signed)
Dana [name]       [name] [id number]             [phone]        Dana [name] Medical Record #[id number] Date of Birth: 1935/10/07  Referring: Dr [name], Hospital service  Primary Care: [name], MD  Chief Complaint:    Chief Complaint  Patient presents with  . Shortness of Breath   Malignant neoplasm of bronchus and lung, unspecified site   Primary site: Lung (Right)   Staging method: AJCC 7th Edition   Clinical: Stage IIIB (T2a, N3, M0) signed by Curt Bears, MD on 11/12/2011 10:06 AM   Summary: Stage IIIB (T2a, N3, M0)  History of Present Illness:     Patient  is a 79 y.o. female with ahistory non-small cell lung cancer, stage IIIB (T2a, N3, M0), dx almost 5 years ago,  Adenocarcinoma, status post concurrent chemoradiation therapy, currently on Tarceva. She ly had a CT scan of the chest on 07/05/2014 which not reveal evidence of progression of disease    however did show a moderate-sized right pleural effusion. She underwent ultrasound-guided thoracentesis which yielded 550 MLS of yellow milky fluid.The procedure was  complicated by a moderate-sized pneumothorax. She was monitored and then discharged home. Over the past week she has had ongoing exertional shortness of breath, fatigue, weakness, poor tolerance to physical exertion. She presented to the emergency room at [facility] for further workup. On presentation found to have O2 sat of 89%. Repeat chest x-ray performed in the emergency room showed persistent right-sided hydropneumothorax estimated to be at 25%.   Current Activity/ Functional Status: Patient is independent with mobility/ambulation, transfers, ADL's, IADL's.   Zubrod Score: At the time of surgery this patient's most appropriate activity status/level should be described as: []     0    Normal activity, no symptoms []     1    Restricted in physical strenuous activity but ambulatory, able to do out light  work [x]     2    Ambulatory and capable of self care, unable to do work activities, up and about                 more than 50%  Of the time                            []     3    Only limited self care, in bed greater than 50% of waking hours []     4    Completely disabled, no self care, confined to bed or chair []     5    Moribund  Past Medical History  Diagnosis Date  . Hemorrhoid   . COPD (chronic obstructive pulmonary disease)   . GERD (gastroesophageal reflux disease)   . History of lung cancer ONCOLOGIST--  DR [facility]--  LAST CT ,  NO RECURRENCE OR METS    DX JAN 2010 --  STAGE IIIA  NON-SMALL CELL ADENOCARCINOMA (RIGHT MIDDLE LOBE)---  S/P  CHEMORADIATIO THERAPY  (COMPLETE 03-21-2009)  . History of anal fissures   . Arthritis     HANDS,  WRISTS  . Osteoporosis   . Right wrist pain     MASS  . Dyspnea on exertion   . Normal cardiac stress test     2007  PER PT  . Wears glasses   . Rash, skin  RIGHT FOREARM/ HAND  . Cancer     Past Surgical History  Procedure Laterality Date  . Anal fissure repair  07/09/2011    INTERNAL SPHINCTEROTOMY  . Knee arthroscopy Right 1999  . Thumb arthroscopy Left     - removed bone spur  . Thoracoscopy  01-26-2009    w/   lung/  node biopsy's  . Benign excision left breast central duct  02/1999  . Vault suspension plus cystocele repair with graft  06-13-2010  . Transthoracic echocardiogram  12-23-2008    NORMAL LV/  EF 65-70%/  MILD MR  &  TR  . Total abdominal hysterectomy w/ bilateral salpingoophorectomy  1982    W/  APPENDECTOMY  . Tonsillectomy  AS CHILD  . Excision right wrist mass  2012  . Mass excision Right 03/11/2014    Procedure: RIGHT WRIST DEEP MASS EXCISION WITH CULTURE AND BIOSPY;  Surgeon: Linna Hoff, MD;  Location: [facility];  Service: Orthopedics;  Laterality: Right;    History  Smoking status  . Former Smoker -- 1.00 packs/day for 30 years  . Types: Cigarettes  . Quit date: 12/24/1977    Smokeless tobacco  . Never Used    History  Alcohol Use No    History   Social History  . Marital Status: Married    Spouse Name: N/A    Number of Children: N/A  . Years of Education: N/A   Occupational History  . Not on file.   Social History Main Topics  . Smoking status: Former Smoker -- 1.00 packs/day for 30 years    Types: Cigarettes    Quit date: 12/24/1977  . Smokeless tobacco: Never Used  . Alcohol Use: No  . Drug Use: No  . Sexual Activity: Not on file   Other Topics Concern  . Not on file   Social History Narrative  . No narrative on file    Allergies  Allergen Reactions  . Amoxicillin Diarrhea    Hx of C Diff     Current Facility-Administered Medications  Medication Dose Route Frequency Provider Last Rate Last Dose  . 0.9 %  sodium chloride infusion   Intravenous Continuous Kelvin Cellar, MD      . acetaminophen (TYLENOL) tablet 650 mg  650 mg Oral Q6H PRN Kelvin Cellar, MD       Or  . acetaminophen (TYLENOL) suppository 650 mg  650 mg Rectal Q6H PRN Kelvin Cellar, MD      . alum & mag hydroxide-simeth (MAALOX/MYLANTA) 200-200-20 MG/5ML suspension 30 mL  30 mL Oral Q6H PRN Kelvin Cellar, MD      . Derrill Memo ON 07/14/2014] aspirin EC tablet 81 mg  81 mg Oral Daily Kelvin Cellar, MD      . erlotinib (TARCEVA) tablet 100 mg  100 mg Oral Once Kelvin Cellar, MD      . Derrill Memo ON 07/14/2014] erlotinib (TARCEVA) tablet 100 mg  100 mg Oral Q24H Annita Brod, MD      . heparin injection 5,000 Units  5,000 Units Subcutaneous 3 times per day Kelvin Cellar, MD      . Derrill Memo ON 07/14/2014] hydroxychloroquine (PLAQUENIL) tablet 400 mg  400 mg Oral q morning - 10a Kelvin Cellar, MD      . LORazepam (ATIVAN) tablet 0.5 mg  0.5 mg Oral Q8H PRN Kelvin Cellar, MD      . morphine 2 MG/ML injection 2 mg  2 mg Intravenous Q3H PRN Kelvin Cellar, MD      .  ondansetron (ZOFRAN) tablet 4 mg  4 mg Oral Q6H PRN Kelvin Cellar, MD       Or  . ondansetron  (ZOFRAN) injection 4 mg  4 mg Intravenous Q6H PRN Kelvin Cellar, MD      . oxyCODONE (Oxy IR/ROXICODONE) immediate release tablet 5 mg  5 mg Oral Q4H PRN Kelvin Cellar, MD      . sodium chloride 0.9 % injection 3 mL  3 mL Intravenous Q12H Kelvin Cellar, MD      . Derrill Memo ON 07/14/2014] tiotropium (SPIRIVA) inhalation capsule 18 mcg  18 mcg Inhalation q morning - 10a Kelvin Cellar, MD        Prescriptions prior to admission  Medication Sig Dispense Refill  . aspirin EC 81 MG tablet Take 81 mg by mouth daily.      . Calcium Carbonate-Vitamin D 600-400 MG-UNIT per chew tablet Chew 1 tablet by mouth 2 (two) times daily.      . cholecalciferol (VITAMIN D) 1000 UNITS tablet Take 1,000 Units by mouth daily.       . Coenzyme Q10 (COQ10) 100 MG CAPS Take 1 capsule by mouth daily.       Marland Kitchen conjugated estrogens (PREMARIN) vaginal cream Place vaginally once a week.        . Cyanocobalamin (B-12) 1000 MCG CAPS Take 1,000 mcg by mouth 3 (three) times daily with meals.       . erlotinib (TARCEVA) 100 MG tablet Take 1 tablet (100 mg total) by mouth every evening. Take on an empty stomach 1 hour before meals or 2 hours after  30 tablet  1  . Hydrocodone-Acetaminophen (VICODIN) 5-300 MG TABS Take 1 tablet by mouth 4 (four) times daily as needed (pain.).      Marland Kitchen hydroxychloroquine (PLAQUENIL) 200 MG tablet Take 400 mg by mouth every morning.       . loperamide (IMODIUM A-D) 2 MG tablet Take 1 mg by mouth 4 (four) times daily as needed for diarrhea or loose stools.       Marland Kitchen loratadine (CLARITIN) 10 MG tablet Take 10 mg by mouth daily as needed for allergies.       Marland Kitchen LORazepam (ATIVAN) 0.5 MG tablet Take 0.5 mg by mouth every 8 (eight) hours as needed for anxiety.      Vladimir Faster Glycol-Propyl Glycol (SYSTANE OP) Place 1 drop into both eyes at bedtime as needed (for dry eyes.).       Marland Kitchen Simethicone (GAS-X PO) Take 1 tablet by mouth 2 (two) times daily as needed (gas.).      Marland Kitchen tiotropium (SPIRIVA) 18 MCG  inhalation capsule Place 18 mcg into inhaler and inhale every morning.         Family History  Problem Relation Age of Onset  . Cancer Father     bladder  . Cancer Son     kidney - removed as a teenager     Review of Systems:     Cardiac Review of Systems: Y or N  Chest Pain [  y  ]  Resting SOB [  y ] Exertional SOB  Blue.Reese  ]  Orthopnea [ n ]   Pedal Edema [ n  ]    Palpitations [ n ] Syncope  [ n ]   Presyncope [n   ]  General Review of Systems: [Y] = yes [  ]=no Constitional: recent weight change [  ]; anorexia [  ]; fatigue Blue.Reese  ]; nausea [  ]; night  sweats [  ]; fever [  ]; or chills [  ]                                                               Dental: poor dentition[  ]; Last Dentist visit:   Eye : blurred vision [  ]; diplopia [   ]; vision changes [  ];  Amaurosis fugax[  ]; Resp: cough [  ];  wheezing[  ];  hemoptysis[  ]; shortness of breath[ y ]; paroxysmal nocturnal dyspnea[ y ]; dyspnea on exertion[ y ]; or orthopnea[  ];  GI:  gallstones[  ], vomiting[  ];  dysphagia[  ]; melena[  ];  hematochezia [  ]; heartburn[  ];   Hx of  Colonoscopy[  ]; GU: kidney stones [  ]; hematuria[  ];   dysuria [  ];  nocturia[  ];  history of     obstruction [  ]; urinary frequency [  ]             Skin: rash, swelling[  ];, hair loss[  ];  peripheral edema[  ];  or itching[  ]; Musculosketetal: myalgias[  ];  joint swelling[  ];  joint erythema[  ];  joint pain[  ];  back pain[  ];  Heme/Lymph: bruising[  ];  bleeding[  ];  anemia[  ];  Neuro: TIA[  ];  headaches[  ];  stroke[  ];  vertigo[  ];  seizures[  ];   paresthesias[  ];  difficulty walking[  ];  Psych:depression[  ]; anxiety[  ];  Endocrine: diabetes[n  ];  thyroid dysfunction[n  ];  Immunizations: Flu [  ]; Pneumococcal[  ];  Other:  Physical Exam: BP 120/60  Pulse 80  Temp(Src) 97.7 F (36.5 C) (Oral)  Resp 22  Ht 5\' 4"  (1.626 m)  Wt 121 lb 3.2 oz (54.976 kg)  BMI 20.79 kg/m2  SpO2 100%  General appearance: alert,  cooperative, appears stated age, cachectic and mild distress Neurologic: intact Heart: regular rate and rhythm, S1, S2 normal, no murmur, click, rub or gallop Lungs: diminished breath sounds RUL Abdomen: soft, non-tender; bowel sounds normal; no masses,  no organomegaly Extremities: extremities normal, atraumatic, no cyanosis or edema and Homans sign is negative, no sign of DVT no cervical or axillary adenopathy   Diagnostic Studies & Laboratory data:     Recent Radiology Findings:   Dg Chest 2 View  07/13/2014   CLINICAL DATA:  Followup pneumothorax.  EXAM: CHEST  2 VIEW  COMPARISON:  07/09/2014  FINDINGS: Stable moderate-sized right-sided hydro pneumothorax estimated at 25%. The left lung remains clear. The cardiac silhouette, mediastinal and hilar contours are Stable.  IMPRESSION: Persistent right-sided hydropneumothorax estimated at 25 percent.   Electronically Signed   By: Kalman Jewels M.D.   On: 07/13/2014 10:16      Recent Lab Findings: Lab Results  Component Value Date   WBC 6.2 07/13/2014   HGB 12.4 07/13/2014   HCT 35.9* 07/13/2014   PLT 329 07/13/2014   GLUCOSE 85 07/13/2014   CHOL  Value: 225        ATP III CLASSIFICATION:  <200     mg/dL   Desirable  200-239  mg/dL   Borderline High  >=  240    mg/dL   High* 12/23/2008   TRIG 94 12/23/2008   HDL 64 12/23/2008   LDLCALC  Value: 142        Total Cholesterol/HDL:CHD Risk Coronary Heart Disease Risk Table                     Men   Women  1/2 Average Risk   3.4   3.3* 12/23/2008   ALT 24 06/28/2014   AST 24 06/28/2014   NA 140 07/13/2014   K 4.3 07/13/2014   CL 104 07/13/2014   CREATININE 0.98 07/13/2014   BUN 18 07/13/2014   CO2 23 07/13/2014   TSH 2.661 11/10/2012   INR 1.10* 06/08/2010   HGBA1C  Value: 5.8 (NOTE)   The ADA recommends the following therapeutic goal for glycemic   control related to Hgb A1C measurement:   Goal of Therapy:   < 7.0% Hgb A1C   Reference: American Diabetes Association: Clinical Practice   Recommendations  2008, Diabetes Care,  2008, 31:(Suppl 1). 12/23/2008      Assessment / Plan:    Iatrogenic ptx on the right after thoracentesis with recurrent pleural effusion.  I have recommended to patient proceeding with right pleurx and a small standard ct to evacuate the PTX and provide drainage of   effusion if continues to accumulate.   The goals risks and alternatives of the planned surgical procedure right chest tube and pleurix  have been discussed with the patient in detail. The risks of the procedure including death, infection, stroke, myocardial infarction, bleeding, blood transfusion have all been discussed specifically.  I have quoted Dana [name] a 1 % of perioperative mortality and a complication rate as high as 10 %. The patient's questions have been answered.Dana [name] is willing  to proceed with the planned procedure.        . Hemorrhoid   . COPD (chronic obstructive pulmonary disease)   . GERD (gastroesophageal reflux disease)   . History of lung cancer ONCOLOGIST--  DR [facility]--  LAST CT ,  NO RECURRENCE OR METS    DX JAN 2010 --  STAGE IIIA  NON-SMALL CELL ADENOCARCINOMA (RIGHT MIDDLE LOBE)---  S/P  CHEMORADIATIO THERAPY  (COMPLETE 03-21-2009)  . History of anal fissures   . Arthritis     HANDS,  WRISTS  . Osteoporosis   . Right wrist pain     MASS  . Dyspnea on exertion   . Normal cardiac stress test     2007  PER PT  . Wears glasses   . Rash, skin     RIGHT FOREARM/ HAND  . Cancer          Grace Isaac MD      [name] [place], [address] Office [phone]   Beeper [phone]  07/13/2014 6:12 PM

## 2014-07-13 NOTE — H&P (Signed)
[facility] History and Physical  KIERSTAN AUER QAS:[id number] DOB: [birth year] DOA: 07/13/2014  Referring physician:  PCP: [name], MD   Chief Complaint: Shortness of breath  HPI: Dana [name] is a 79 y.o. female with a past medical history non-small cell lung cancer, stage IIIB (T2a, N3, MO),  Adenocarcinoma, status post concurrent chemoradiation therapy, currently on Tarceva, who recently had a CT scan of the chest which not reveal evidence for pro on 07/05/2014 which yielded 550 MLS of yellow milky fluid. gression of disease however did show a moderate-sized right pleural effusion. She underwent ultrasound-guided thoracentesis. This was complicated by a moderate-sized pneumothorax felt to be ex vacuo in nature. She was monitored and then discharged home. Over the past week she has had ongoing exertional shortness of breath, fatigue, weakness, poor tolerance to physical exertion. She presented to the emergency room at [facility] for further workup. On presentation found to have O2 sat of 89%. Repeat chest x-ray performed in the emergency room showed persistent right-sided hydropneumothorax estimated to be at 25%. Dr. Leonides Schanz of emergency medicine discussed case with Dr. ON of cardiothoracic surgery, who recommended patient be transferred to [facility] or further management. Case has been discussed with Dr. [place] Pink of medicine who will accept patient in transfer.                                                                       Review of Systems:  Constitutional:  No weight loss, night sweats, Fevers, chills, positive for fatigue and poor tolerance to physical exertion  HEENT:  No headaches, Difficulty swallowing,Tooth/dental problems,Sore throat,  No sneezing, itching, ear ache, nasal congestion, post nasal drip,  Cardio-vascular:  No chest pain, Orthopnea, PND, swelling in lower extremities, anasarca, dizziness, palpitations  GI:  No heartburn,  indigestion, abdominal pain, nausea, vomiting, diarrhea, change in bowel habits, loss of appetite  Resp:  Positive for shortness of breath with exertion or at rest. No excess mucus, no productive cough, No non-productive cough, No coughing up of blood.No change in color of mucus.No wheezing.No chest wall deformity  Skin:  no rash or lesions.  GU:  no dysuria, change in color of urine, no urgency or frequency. No flank pain.  Musculoskeletal:  No joint pain or swelling. No decreased range of motion. No back pain.  Psych:  No change in mood or affect. No depression or anxiety. No memory loss.   Past Medical History  Diagnosis Date  . Hemorrhoid   . COPD (chronic obstructive pulmonary disease)   . GERD (gastroesophageal reflux disease)   . History of lung cancer ONCOLOGIST--  DR [facility]--  LAST CT ,  NO RECURRENCE OR METS    DX JAN 2010 --  STAGE IIIA  NON-SMALL CELL ADENOCARCINOMA (RIGHT MIDDLE LOBE)---  S/P  CHEMORADIATIO THERAPY  (COMPLETE 03-21-2009)  . History of anal fissures   . Arthritis     HANDS,  WRISTS  . Osteoporosis   . Right wrist pain     MASS  . Dyspnea on exertion   . Normal cardiac stress test     2007  PER PT  . Wears glasses   . Rash, skin     RIGHT FOREARM/ HAND  . Cancer  Past Surgical History  Procedure Laterality Date  . Anal fissure repair  07/09/2011    INTERNAL SPHINCTEROTOMY  . Knee arthroscopy Right 1999  . Thumb arthroscopy Left     - removed bone spur  . Thoracoscopy  01-26-2009    w/   lung/  node biopsy's  . Benign excision left breast central duct  02/1999  . Vault suspension plus cystocele repair with graft  06-13-2010  . Transthoracic echocardiogram  12-23-2008    NORMAL LV/  EF 65-70%/  MILD MR  &  TR  . Total abdominal hysterectomy w/ bilateral salpingoophorectomy  1982    W/  APPENDECTOMY  . Tonsillectomy  AS CHILD  . Excision right wrist mass  2012  . Mass excision Right 03/11/2014    Procedure: RIGHT WRIST DEEP MASS EXCISION  WITH CULTURE AND BIOSPY;  Surgeon: Linna Hoff, MD;  Location: [facility];  Service: Orthopedics;  Laterality: Right;   Social History:  reports that she quit smoking about 36 years ago. Her smoking use included Cigarettes. She has a 30 pack-year smoking history. She has never used smokeless tobacco. She reports that she does not drink alcohol or use illicit drugs.  Allergies  Allergen Reactions  . Amoxicillin Diarrhea    Hx of C Diff     Family History  Problem Relation Age of Onset  . Cancer Father     bladder  . Cancer Son     kidney - removed as a teenager     Prior to Admission medications   Medication Sig Start Date End Date Taking? Authorizing Provider  aspirin EC 81 MG tablet Take 81 mg by mouth daily.   Yes Historical Provider, MD  Calcium Carbonate-Vitamin D 600-400 MG-UNIT per chew tablet Chew 1 tablet by mouth 2 (two) times daily.   Yes Historical Provider, MD  cholecalciferol (VITAMIN D) 1000 UNITS tablet Take 1,000 Units by mouth daily.    Yes Historical Provider, MD  Coenzyme Q10 (COQ10) 100 MG CAPS Take 1 capsule by mouth daily.    Yes Historical Provider, MD  conjugated estrogens (PREMARIN) vaginal cream Place vaginally once a week.     Yes Historical Provider, MD  Cyanocobalamin (B-12) 1000 MCG CAPS Take 1,000 mcg by mouth 3 (three) times daily with meals.    Yes Historical Provider, MD  erlotinib (TARCEVA) 100 MG tablet Take 1 tablet (100 mg total) by mouth every evening. Take on an empty stomach 1 hour before meals or 2 hours after 06/28/14  Yes Curt Bears, MD  Hydrocodone-Acetaminophen (VICODIN) 5-300 MG TABS Take 1 tablet by mouth 4 (four) times daily as needed (pain.).   Yes Historical Provider, MD  hydroxychloroquine (PLAQUENIL) 200 MG tablet Take 400 mg by mouth every morning.    Yes Historical Provider, MD  loperamide (IMODIUM A-D) 2 MG tablet Take 1 mg by mouth 4 (four) times daily as needed for diarrhea or loose stools.    Yes Historical  Provider, MD  loratadine (CLARITIN) 10 MG tablet Take 10 mg by mouth daily as needed for allergies.    Yes Historical Provider, MD  LORazepam (ATIVAN) 0.5 MG tablet Take 0.5 mg by mouth every 8 (eight) hours as needed for anxiety.   Yes Historical Provider, MD  Polyethyl Glycol-Propyl Glycol (SYSTANE OP) Place 1 drop into both eyes at bedtime as needed (for dry eyes.).    Yes Historical Provider, MD  Simethicone (GAS-X PO) Take 1 tablet by mouth 2 (two) times daily as  needed (gas.).   Yes Historical Provider, MD  tiotropium (SPIRIVA) 18 MCG inhalation capsule Place 18 mcg into inhaler and inhale every morning.    Yes Historical Provider, MD   Physical Exam: Filed Vitals:   07/13/14 0944 07/13/14 1046 07/13/14 1233  BP: 104/54  141/71  Pulse: 94 84 89  Temp: 97.9 F (36.6 C)  98.3 F (36.8 C)  TempSrc: Oral  Oral  Resp: 16  22  SpO2: 100% 99% 100%    Wt Readings from Last 3 Encounters:  07/05/14 54.658 kg (120 lb 8 oz)  05/31/14 55.656 kg (122 lb 11.2 oz)  04/27/14 58.333 kg (128 lb 9.6 oz)    General:  Appears calm and comfortable, no acute distress. She is mentating well, did not appear to be in acute respiratory failure Eyes: PERRL, normal lids, irises & conjunctiva ENT: grossly normal hearing, lips & tongue Neck: no LAD, masses or thyromegaly Cardiovascular: RRR, no m/r/g. No LE edema. Telemetry: SR, no arrhythmias  Respiratory: Overall lungs were CTA bilaterally, no w/r/r. Normal respiratory effort. Possibly having some diminished breath sounds to right hemithorax Abdomen: soft, ntnd Skin: no rash or induration seen on limited exam Musculoskeletal: grossly normal tone BUE/BLE Psychiatric: grossly normal mood and affect, speech fluent and appropriate Neurologic: grossly non-focal.          Labs on Admission:  Basic Metabolic Panel:  Recent Labs Lab 07/13/14 1012  NA 140  K 4.3  CL 104  CO2 23  GLUCOSE 85  BUN 18  CREATININE 0.98  CALCIUM 9.3   Liver Function  Tests: No results found for this basename: AST, ALT, ALKPHOS, BILITOT, PROT, ALBUMIN,  in the last 168 hours No results found for this basename: LIPASE, AMYLASE,  in the last 168 hours No results found for this basename: AMMONIA,  in the last 168 hours CBC:  Recent Labs Lab 07/13/14 1012  WBC 6.2  NEUTROABS 4.5  HGB 12.4  HCT 35.9*  MCV 89.1  PLT 329   Cardiac Enzymes: No results found for this basename: CKTOTAL, CKMB, CKMBINDEX, TROPONINI,  in the last 168 hours  BNP (last 3 results) No results found for this basename: PROBNP,  in the last 8760 hours CBG: No results found for this basename: GLUCAP,  in the last 168 hours  Radiological Exams on Admission: Dg Chest 2 View  07/13/2014   CLINICAL DATA:  Followup pneumothorax.  EXAM: CHEST  2 VIEW  COMPARISON:  07/09/2014  FINDINGS: Stable moderate-sized right-sided hydro pneumothorax estimated at 25%. The left lung remains clear. The cardiac silhouette, mediastinal and hilar contours are Stable.  IMPRESSION: Persistent right-sided hydropneumothorax estimated at 25 percent.   Electronically Signed   By: Kalman Jewels M.D.   On: 07/13/2014 10:16    EKG: Independently reviewed.   Assessment/Plan Principal Problem:   Pneumothorax, iatrogenic Active Problems:   Pleural effusion, right   Malignant neoplasm of bronchus and lung, unspecified site   Respiratory distress   Pneumothorax   1. Right-sided pneumothorax. Patient undergoing ultrasound-guided thoracentesis performed on 07/05/2014 for moderate size right-sided pleural effusion. This is complicated by the development of a pneumothorax. Conservative approach was taken, as she was discharged in stable condition, and over the course of the week having persistent significant dyspnea on exertion. Repeat chest x-ray done in the emergency room today showing moderate-sized right-sided hydropneumothorax estimated at 25%. Patient will be transferred to [facility] to be evaluated  by Dr. Roxy Manns of cardiothoracic surgery. She is presently hemodynamically  stable, satting low to mid 90s on room air. Will admit to telemetry. 2. History of non-small cell lung cancer, currently being followed by Dr.Mohamed at the [facility]. She had a recent CT scan of her lungs which did not reveal evidence of disease progression although did show a right sided pleural effusion. She is currently maintained on Tarceva 150 mg by mouth daily, previously undergoing concurrent chemoradiation therapy last dose that was given in March of 2010. 3. Respiratory distress. Likely secondary to pneumothorax. 4. DVT prophylaxis. Subcutaneous heparin   Code Status: Full Code Family Communication:  Disposition Plan: Will transfer to [facility] to be evaluated by CT Surgery. Anticipate she will require greater than 2 nights hospitalization  Time spent: 65 min  Kelvin Cellar [facility] Pager [phone]  **Disclaimer: This note may have been dictated with voice recognition software. Similar sounding words can inadvertently be transcribed and this note may contain transcription errors which may not have been corrected upon publication of note.**

## 2014-07-13 NOTE — ED Notes (Signed)
MD at bedside. 

## 2014-07-13 NOTE — ED Provider Notes (Signed)
TIME SEEN: 9:55 AM  CHIEF COMPLAINT: Shortness of breath  HPI: Patient is a 79 y.o. F with history of lung cancer followed by Dr. [name] who is currently on [place] who presents to the emergency department with shortness of breath. She states that she has chronic shortness of breath from her lung cancer but has been worse over the past several weeks. She had a CT scan of her chest on 06/28/14 that showed a moderate right-sided pleural effusion. She had a thoracentesis performed a radiology on 07/05/14. Afterward she had a moderate size pneumothorax. She was admitted to the hospital. She states that every 2 years she has a pleural effusion and has to have it drained. This is her third pleural effusion. She states she does have some very mild chest pain in the Center of her chest and she tries to take a deep breath but none otherwise. No recent fevers or cough. No lower extremity swelling or pain. No history of cardiac disease. Last stress test was 5 years ago and was normal. No history of PE or DVT.  ROS: See HPI Constitutional: no fever  Eyes: no drainage  ENT: no runny nose   Cardiovascular:   chest pain  Resp:  SOB  GI: no vomiting GU: no dysuria Integumentary: no rash  Allergy: no hives  Musculoskeletal: no leg swelling  Neurological: no slurred speech ROS otherwise negative  PAST MEDICAL HISTORY/PAST SURGICAL HISTORY:  Past Medical History  Diagnosis Date  . Hemorrhoid   . COPD (chronic obstructive pulmonary disease)   . GERD (gastroesophageal reflux disease)   . History of lung cancer ONCOLOGIST--  DR [facility]--  LAST CT ,  NO RECURRENCE OR METS    DX JAN 2010 --  STAGE IIIA  NON-SMALL CELL ADENOCARCINOMA (RIGHT MIDDLE LOBE)---  S/P  CHEMORADIATIO THERAPY  (COMPLETE 03-21-2009)  . History of anal fissures   . Arthritis     HANDS,  WRISTS  . Osteoporosis   . Right wrist pain     MASS  . Dyspnea on exertion   . Normal cardiac stress test     2007  PER PT  . Wears glasses   .  Rash, skin     RIGHT FOREARM/ HAND  . Cancer     MEDICATIONS:  Prior to Admission medications   Medication Sig Start Date End Date Taking? Authorizing Provider  aspirin EC 81 MG tablet Take 81 mg by mouth daily.    Historical Provider, MD  calcium carbonate (OS-CAL) 600 MG TABS Take 600 mg by mouth 3 (three) times daily with meals.     Historical Provider, MD  cholecalciferol (VITAMIN D) 1000 UNITS tablet Take 1,000 Units by mouth daily.     Historical Provider, MD  Coenzyme Q10 (COQ10) 100 MG CAPS Take 1 capsule by mouth daily.     Historical Provider, MD  conjugated estrogens (PREMARIN) vaginal cream Place vaginally once a week.      Historical Provider, MD  Cyanocobalamin (B-12) 1000 MCG CAPS Take 1,000 mcg by mouth 2 (two) times daily.     Historical Provider, MD  erlotinib (TARCEVA) 100 MG tablet Take 1 tablet (100 mg total) by mouth every evening. Take on an empty stomach 1 hour before meals or 2 hours after 06/28/14   Curt Bears, MD  Hydrocodone-Acetaminophen (VICODIN) 5-300 MG TABS Take 1 tablet by mouth 4 (four) times daily as needed (PAIN). 03/11/14   Linna Hoff, MD  hydroxychloroquine (PLAQUENIL) 200 MG tablet Take 200 mg  by mouth daily.    Historical Provider, MD  loperamide (IMODIUM A-D) 2 MG tablet Take 2 mg by mouth 4 (four) times daily as needed for diarrhea or loose stools.    Historical Provider, MD  loratadine (CLARITIN) 10 MG tablet Take 10 mg by mouth daily as needed.     Historical Provider, MD  LORazepam (ATIVAN) 0.5 MG tablet Take 1 tablet (0.5 mg total) by mouth every 8 (eight) hours as needed. 02/01/14   Carlton Adam, PA-C  Multiple Vitamin (MULTIVITAMIN) tablet Take 1 tablet by mouth daily.      Historical Provider, MD  Omega-3 Fatty Acids (OMEGA-3 FISH OIL) 1200 MG CAPS Take 1 capsule by mouth daily.    Historical Provider, MD  Polyethyl Glycol-Propyl Glycol (SYSTANE OP) Place 1 drop into both eyes as needed. At night for dry eyes    Historical Provider, MD   tiotropium (SPIRIVA) 18 MCG inhalation capsule Place 18 mcg into inhaler and inhale every morning.     Historical Provider, MD    ALLERGIES:  Allergies  Allergen Reactions  . Amoxicillin Diarrhea    Hx of C Diff     SOCIAL HISTORY:  History  Substance Use Topics  . Smoking status: Former Smoker -- 1.00 packs/day for 30 years    Types: Cigarettes    Quit date: 12/24/1977  . Smokeless tobacco: Never Used  . Alcohol Use: No    FAMILY HISTORY: Family History  Problem Relation Age of Onset  . Cancer Father     bladder  . Cancer Son     kidney - removed as a teenager    EXAM: BP 104/54  Pulse 94  Temp(Src) 97.9 F (36.6 C) (Oral)  Resp 16  SpO2 100% CONSTITUTIONAL: Alert and oriented and responds appropriately to questions. Well-appearing; well-nourished HEAD: Normocephalic EYES: Conjunctivae clear, PERRL ENT: normal nose; no rhinorrhea; moist mucous membranes; pharynx without lesions noted NECK: Supple, no meningismus, no LAD  CARD: RRR; S1 and S2 appreciated; no murmurs, no clicks, no rubs, no gallops RESP: Normal chest excursion without splinting or tachypnea; breath sounds clear and equal bilaterally; no wheezes, no rhonchi, no rales, no respiratory distress or hypoxia, no increased work of breathing, good breath sounds heard bilaterally and diffusely ABD/GI: Normal bowel sounds; non-distended; soft, non-tender, no rebound, no guarding BACK:  The back appears normal and is non-tender to palpation, there is no CVA tenderness EXT: Normal ROM in all joints; non-tender to palpation; no edema; normal capillary refill; no cyanosis    SKIN: Normal color for age and race; warm NEURO: Moves all extremities equally PSYCH: The patient's mood and manner are appropriate. Grooming and personal hygiene are appropriate.  MEDICAL DECISION MAKING: Patient here with worsening shortness of breath. She has no hypoxia at rest, increased work of breathing, tachypnea.  She was recently here  with pleural effusion that required thoracentesis and then had subsequent pneumothorax. She does not currently have a Pleurx catheter and did not have a thoracostomy tube. She is well-appearing in the emergency department. Differential diagnosis includes recurrent pleural effusion, pneumothorax, pneumonia, pulmonary embolus, ACS. We'll obtain labs, chest x-ray. We'll ambulate with pulse oximetry.  ED PROGRESS: Patient's x-ray shows persistent mild to moderate right-sided pleural effusion and 25% pneumothorax. She is able to a bili without becoming hypoxic but does become very symptomatic. Otherwise her labs are unremarkable, troponin negative. We'll discuss with cardiothoracic surgery for recommendations for management given patient's symptoms.   11:30 Am  Spoke with Dr. Roxy Manns  with cardiothoracic surgery. Patient does become very symptomatic with ambulation and when I went to reevaluate the patient, oxygen saturations were 89% for a transient period. Sats improved without intervention. Discussed with patient her options for close outpatient followup versus admission for cardiothoracic consult and monitoring. She states that given she is symptomatic, she would like further management as an inpatient. We'll transfer to [facility].  Will consult hospitalist. PCP is with [facility] physicians.   11:50 AM  Spoke with Dr. Coralyn Pear for admission to tele[facility]pt at [facility].  Dr. Gevena Barre is accepting.    EKG Interpretation  Date/Time:  Tuesday July 13 2014 09:52:50 EDT Ventricular Rate:  90 PR Interval:  141 QRS Duration: 79 QT Interval:  359 QTC Calculation: 439 R Axis:   52 Text Interpretation:  Sinus rhythm Low voltage, precordial leads Confirmed by Lael Wetherbee,  DO, Allan Minotti ([id number]) on 07/13/2014 10:06:09 AM        [place], DO 07/13/14 1153

## 2014-07-13 NOTE — ED Notes (Signed)
Pt c/o SOB, has hx of lung CA. Pt states Monday of last week, scan was good other than a little fluid in lungs, fluid was drained, after x-ray there was a portion of lung collapsed. Pt went back last Friday with upper part of right lung still collapsed. Pt c/ SOB especially with exertion and pt gets lightheadedness. Pt has pressure across chest cant get a deep breathe.

## 2014-07-13 NOTE — ED Notes (Signed)
[facility]

## 2014-07-14 ENCOUNTER — Inpatient Hospital Stay (HOSPITAL_COMMUNITY): Payer: Medicare Other | Admitting: Anesthesiology

## 2014-07-14 ENCOUNTER — Inpatient Hospital Stay (HOSPITAL_COMMUNITY): Payer: Medicare Other

## 2014-07-14 ENCOUNTER — Encounter (HOSPITAL_COMMUNITY): Payer: Self-pay | Admitting: Anesthesiology

## 2014-07-14 ENCOUNTER — Encounter (HOSPITAL_COMMUNITY)
Admission: EM | Disposition: A | Discharge status: Home / Self Care | Payer: Self-pay | Source: Home / Self Care | Attending: Family Medicine

## 2014-07-14 ENCOUNTER — Encounter (HOSPITAL_COMMUNITY): Payer: Medicare Other | Admitting: Anesthesiology

## 2014-07-14 DIAGNOSIS — C3490 Malignant neoplasm of unspecified part of unspecified bronchus or lung: Secondary | ICD-10-CM

## 2014-07-14 HISTORY — PX: CHEST TUBE INSERTION: SHX231

## 2014-07-14 LAB — BASIC METABOLIC PANEL
ANION GAP: 12 (ref 5–15)
BUN: 12 mg/dL (ref 6–23)
CALCIUM: 8.4 mg/dL (ref 8.4–10.5)
CO2: 25 meq/L (ref 19–32)
CREATININE: 0.83 mg/dL (ref 0.50–1.10)
Chloride: 106 mEq/L (ref 96–112)
GFR calc non Af Amer: 66 mL/min — ABNORMAL LOW (ref >90)
GFR, EST AFRICAN AMERICAN: 77 mL/min — AB (ref >90)
Glucose, Bld: 83 mg/dL (ref 70–99)
Potassium: 4.1 mEq/L (ref 3.7–5.3)
Sodium: 143 mEq/L (ref 137–147)

## 2014-07-14 LAB — BODY FLUID CELL COUNT WITH DIFFERENTIAL
Eos, Fluid: 6 %
Lymphs, Fluid: 83 %
Monocyte-Macrophage-Serous Fluid: 2 % — ABNORMAL LOW (ref 50–90)
NEUTROPHIL FLUID: 9 % (ref 0–25)
WBC FLUID: 6466 uL — AB (ref 0–1000)

## 2014-07-14 LAB — CBC
HEMATOCRIT: 34.5 % — AB (ref 36.0–46.0)
Hemoglobin: 11.6 g/dL — ABNORMAL LOW (ref 12.0–15.0)
MCH: 31 pg (ref 26.0–34.0)
MCHC: 33.6 g/dL (ref 30.0–36.0)
MCV: 92.2 fL (ref 78.0–100.0)
Platelets: 275 10*3/uL (ref 150–400)
RBC: 3.74 MIL/uL — ABNORMAL LOW (ref 3.87–5.11)
RDW: 14.1 % (ref 11.5–15.5)
WBC: 4.3 10*3/uL (ref 4.0–10.5)

## 2014-07-14 SURGERY — INSERTION, PLEURAL DRAINAGE CATHETER
Anesthesia: Monitor Anesthesia Care | Site: Chest | Laterality: Right

## 2014-07-14 MED ORDER — LIDOCAINE HCL (PF) 1 % IJ SOLN
INTRAMUSCULAR | Status: DC | PRN
  Administered 2014-07-14: 10 mL

## 2014-07-14 MED ORDER — FENTANYL CITRATE 0.05 MG/ML IJ SOLN
INTRAMUSCULAR | Status: AC
  Filled 2014-07-14: qty 5

## 2014-07-14 MED ORDER — PROPOFOL INFUSION 10 MG/ML OPTIME
INTRAVENOUS | Status: DC | PRN
Start: 2014-07-14 — End: 2014-07-14
  Administered 2014-07-14: 50 ug/kg/min via INTRAVENOUS

## 2014-07-14 MED ORDER — PROPOFOL 10 MG/ML IV BOLUS
INTRAVENOUS | Status: AC
  Filled 2014-07-14: qty 20

## 2014-07-14 MED ORDER — FENTANYL CITRATE 0.05 MG/ML IJ SOLN
25.0000 ug | INTRAMUSCULAR | Status: DC | PRN
Start: 2014-07-14 — End: 2014-07-14

## 2014-07-14 MED ORDER — MIDAZOLAM HCL 5 MG/5ML IJ SOLN
INTRAMUSCULAR | Status: DC | PRN
  Administered 2014-07-14 (×2): 1 mg via INTRAVENOUS

## 2014-07-14 MED ORDER — ONDANSETRON HCL 4 MG/2ML IJ SOLN
INTRAMUSCULAR | Status: DC | PRN
  Administered 2014-07-14: 4 mg via INTRAVENOUS

## 2014-07-14 MED ORDER — SUCCINYLCHOLINE CHLORIDE 20 MG/ML IJ SOLN
INTRAMUSCULAR | Status: AC
  Filled 2014-07-14: qty 1

## 2014-07-14 MED ORDER — LIDOCAINE HCL (CARDIAC) 20 MG/ML IV SOLN
INTRAVENOUS | Status: DC | PRN
  Administered 2014-07-14: 50 mg via INTRAVENOUS

## 2014-07-14 MED ORDER — FENTANYL CITRATE 0.05 MG/ML IJ SOLN
INTRAMUSCULAR | Status: DC | PRN
  Administered 2014-07-14: 25 ug via INTRAVENOUS

## 2014-07-14 MED ORDER — TRAMADOL HCL 50 MG PO TABS
50.0000 mg | ORAL_TABLET | Freq: Four times a day (QID) | ORAL | Status: DC | PRN
  Administered 2014-07-14: 50 mg via ORAL
  Filled 2014-07-14: qty 1

## 2014-07-14 MED ORDER — PHENYLEPHRINE 40 MCG/ML (10ML) SYRINGE FOR IV PUSH (FOR BLOOD PRESSURE SUPPORT)
PREFILLED_SYRINGE | INTRAVENOUS | Status: AC
  Filled 2014-07-14: qty 10

## 2014-07-14 MED ORDER — EPHEDRINE SULFATE 50 MG/ML IJ SOLN
INTRAMUSCULAR | Status: AC
  Filled 2014-07-14: qty 1

## 2014-07-14 MED ORDER — ROCURONIUM BROMIDE 50 MG/5ML IV SOLN
INTRAVENOUS | Status: AC
  Filled 2014-07-14: qty 1

## 2014-07-14 MED ORDER — SODIUM CHLORIDE 0.9 % IV SOLN
INTRAVENOUS | Status: DC | PRN
  Administered 2014-07-14: 08:00:00 via INTRAVENOUS

## 2014-07-14 MED ORDER — LIDOCAINE HCL (CARDIAC) 20 MG/ML IV SOLN
INTRAVENOUS | Status: AC
  Filled 2014-07-14: qty 5

## 2014-07-14 MED ORDER — SODIUM CHLORIDE 0.9 % IJ SOLN
INTRAMUSCULAR | Status: AC
  Filled 2014-07-14: qty 10

## 2014-07-14 MED ORDER — GLYCOPYRROLATE 0.2 MG/ML IJ SOLN
INTRAMUSCULAR | Status: AC
  Filled 2014-07-14: qty 1

## 2014-07-14 MED ORDER — MIDAZOLAM HCL 2 MG/2ML IJ SOLN
INTRAMUSCULAR | Status: AC
  Filled 2014-07-14: qty 2

## 2014-07-14 MED ORDER — PHENYLEPHRINE HCL 10 MG/ML IJ SOLN
INTRAMUSCULAR | Status: DC | PRN
  Administered 2014-07-14: 120 ug via INTRAVENOUS
  Administered 2014-07-14 (×3): 80 ug via INTRAVENOUS

## 2014-07-14 MED ORDER — ONDANSETRON HCL 4 MG/2ML IJ SOLN
INTRAMUSCULAR | Status: AC
Start: 2014-07-14 — End: 2014-07-14
  Filled 2014-07-14: qty 2

## 2014-07-14 SURGICAL SUPPLY — 31 items
ADH SKN CLS APL DERMABOND .7 (GAUZE/BANDAGES/DRESSINGS) ×2
BRUSH SCRUB EZ PLAIN DRY (MISCELLANEOUS) ×6 IMPLANT
CANISTER SUCTION 2500CC (MISCELLANEOUS) ×3 IMPLANT
COVER SURGICAL LIGHT HANDLE (MISCELLANEOUS) ×3 IMPLANT
COVER TRANSDUCER ULTRASND GEL (DRAPE) ×3 IMPLANT
DERMABOND ADVANCED (GAUZE/BANDAGES/DRESSINGS) ×1
DERMABOND ADVANCED .7 DNX12 (GAUZE/BANDAGES/DRESSINGS) ×2 IMPLANT
DRAPE C-ARM 42X72 X-RAY (DRAPES) ×3 IMPLANT
DRAPE LAPAROSCOPIC ABDOMINAL (DRAPES) ×3 IMPLANT
GLOVE BIO SURGEON STRL SZ 6.5 (GLOVE) ×6 IMPLANT
GLOVE BIOGEL PI IND STRL 6.5 (GLOVE) ×1 IMPLANT
GLOVE BIOGEL PI INDICATOR 6.5 (GLOVE) ×1
GLOVE SURG SS PI 6.5 STRL IVOR (GLOVE) ×2 IMPLANT
GOWN STRL REUS W/ TWL LRG LVL3 (GOWN DISPOSABLE) ×4 IMPLANT
GOWN STRL REUS W/TWL LRG LVL3 (GOWN DISPOSABLE) ×6
KIT BASIN OR (CUSTOM PROCEDURE TRAY) ×3 IMPLANT
KIT PLEURX DRAIN CATH 1000ML (MISCELLANEOUS) ×5 IMPLANT
KIT PLEURX DRAIN CATH 15.5FR (DRAIN) ×3 IMPLANT
KIT ROOM TURNOVER OR (KITS) ×3 IMPLANT
NEEDLE 22X1 1/2 (OR ONLY) (NEEDLE) ×2 IMPLANT
NS IRRIG 1000ML POUR BTL (IV SOLUTION) ×3 IMPLANT
PACK GENERAL/GYN (CUSTOM PROCEDURE TRAY) ×3 IMPLANT
PAD ARMBOARD 7.5X6 YLW CONV (MISCELLANEOUS) ×6 IMPLANT
SET DRAINAGE LINE (MISCELLANEOUS) IMPLANT
SUT ETHILON 3 0 FSL (SUTURE) ×3 IMPLANT
SUT SILK  1 MH (SUTURE) ×2
SUT SILK 1 MH (SUTURE) ×2 IMPLANT
SUT VIC AB 3-0 X1 27 (SUTURE) ×3 IMPLANT
TOWEL OR 17X24 6PK STRL BLUE (TOWEL DISPOSABLE) ×3 IMPLANT
TOWEL OR 17X26 10 PK STRL BLUE (TOWEL DISPOSABLE) ×3 IMPLANT
WATER STERILE IRR 1000ML POUR (IV SOLUTION) ×3 IMPLANT

## 2014-07-14 NOTE — Anesthesia Procedure Notes (Signed)
Procedure Name: MAC Date/Time: 07/14/2014 8:35 AM Performed by: Jenne Campus Pre-anesthesia Checklist: Patient identified, Emergency Drugs available, Suction available, Patient being monitored and Timeout performed Patient Re-evaluated:Patient Re-evaluated prior to inductionOxygen Delivery Method: Simple face mask

## 2014-07-14 NOTE — Progress Notes (Signed)
Patient ID: Dana [name], female   DOB: [birth year], 79 y.o.   MRN: [id number]      [address]       [place] [address]             [phone]                 Day of Surgery Procedure(s) (LRB): INSERTION PLEURAL DRAINAGE CATHETER RIGHT CHEST (Right)  LOS: 1 day   Subjective: Stable after pleurix  Objective: Vital signs in last 24 hours: Patient Vitals for the past 24 hrs:  BP Temp Temp src Pulse Resp SpO2 Height Weight  07/14/14 1019 - 97.4 F (36.3 C) - - - - - -  07/14/14 1004 115/59 mmHg - - 72 13 100 % - -  07/14/14 0949 97/57 mmHg - - 75 12 100 % - -  07/14/14 0934 93/53 mmHg - - 81 12 100 % - -  07/14/14 0933 - 97.6 F (36.4 C) - - - - - -  07/14/14 0455 108/68 mmHg 97.6 F (36.4 C) Oral 87 18 100 % - 120 lb 14.4 oz (54.84 kg)  07/13/14 2029 118/72 mmHg 97.7 F (36.5 C) Oral 101 21 100 % - -  07/13/14 1613 120/60 mmHg 97.7 F (36.5 C) Oral 80 22 100 % 5\' 4"  (1.626 m) 121 lb 3.2 oz (54.976 kg)  07/13/14 1303 - - - 87 11 100 % - -  07/13/14 1233 141/71 mmHg 98.3 F (36.8 C) Oral 89 22 100 % - -    Filed Weights   07/13/14 1613 07/14/14 0455  Weight: 121 lb 3.2 oz (54.976 kg) 120 lb 14.4 oz (54.84 kg)    Hemodynamic parameters for last 24 hours:    Intake/Output from previous day:   Intake/Output this shift: Total I/O In: 775 [I.V.:775] Out: -   Scheduled Meds: . aspirin EC  81 mg Oral Daily  . erlotinib  100 mg Oral Q24H  . heparin  5,000 Units Subcutaneous 3 times per day  . hydroxychloroquine  400 mg Oral q morning - 10a  . sodium chloride  3 mL Intravenous Q12H  . tiotropium  18 mcg Inhalation q morning - 10a   Continuous Infusions: . sodium chloride Stopped (07/14/14 0920)   PRN Meds:.acetaminophen, acetaminophen, alum & mag hydroxide-simeth, LORazepam, morphine injection, ondansetron (ZOFRAN) IV, ondansetron, oxyCODONE, traMADol  General appearance: alert and cooperative Neurologic: intact Heart: regular rate and rhythm,  S1, S2 normal, no murmur, click, rub or gallop Lungs: clear to auscultation bilaterally Abdomen: soft, non-tender; bowel sounds normal; no masses,  no organomegaly Extremities: extremities normal, atraumatic, no cyanosis or edema and Homans sign is negative, no sign of DVT Wound: dressing intact  Lab Results: CBC: Recent Labs  07/13/14 2023 07/14/14 0409  WBC 6.8 4.3  HGB 13.1 11.6*  HCT 39.2 34.5*  PLT 346 275   BMET:  Recent Labs  07/13/14 2023 07/14/14 0409  NA 145 143  K 4.2 4.1  CL 103 106  CO2 26 25  GLUCOSE 131* 83  BUN 15 12  CREATININE 0.84 0.83  CALCIUM 9.1 8.4    PT/INR:  Recent Labs  07/13/14 2023  LABPROT 14.1  INR 1.09     Radiology Dg Chest 2 View  07/14/2014   CLINICAL DATA:  Placement of chest tube. History of right hydropneumothorax  EXAM: CHEST  2 VIEW  COMPARISON:  07/13/2014  FINDINGS: A right pleural drain has been placed. The tube extends  around the right lung apex and the tip appears to be in the medial right chest region. Large amount of the pleural air has been removed. There appears to be a pleural line in the medial right chest. Stable tenting at the right hemidiaphragm but decreased right pleural fluid. Left lung is clear. Heart size is normal.  IMPRESSION: Placement of a right pleural catheter. Markedly decreased or resolved right pleural air.  Decreased right pleural fluid.   Electronically Signed   By: Markus Daft M.D.   On: 07/14/2014 10:52   Dg Chest 2 View  07/13/2014   CLINICAL DATA:  Followup pneumothorax.  EXAM: CHEST  2 VIEW  COMPARISON:  07/09/2014  FINDINGS: Stable moderate-sized right-sided hydro pneumothorax estimated at 25%. The left lung remains clear. The cardiac silhouette, mediastinal and hilar contours are Stable.  IMPRESSION: Persistent right-sided hydropneumothorax estimated at 25 percent.   Electronically Signed   By: Kalman Jewels M.D.   On: 07/13/2014 10:16     Assessment/Plan: S/P Procedure(s) (LRB): INSERTION  PLEURAL DRAINAGE CATHETER RIGHT CHEST (Right) Resolved rt PTX and effusion , will drain pleurix m,thur for now by home nurse, orders left Home per hospital [place] md  Grace Isaac MD 07/14/2014 11:24 AM

## 2014-07-14 NOTE — Care Management Note (Signed)
    Page 1 of 1   07/14/2014     9:18:27 PM CARE MANAGEMENT NOTE 07/14/2014  Patient:  Dana [name], Dana [name]   Account Number:  [id number]  Date Initiated:  07/14/2014  Documentation initiated by:  [facility]  Subjective/Objective Assessment:   Admitted with SOB - 25% plueral effusion.  Pleurix cath inserted.     Action/Plan:   Anticipated DC Date:  07/15/2014   Anticipated DC Plan:  [place]  CM consult      Choice offered to / List presented to:          [facility] arranged  HH-1 RN      [place].   Status of service:  Completed, signed off Medicare Important Message given?   (If response is "NO", the following Medicare IM given date fields will be blank) Date Medicare IM given:   Medicare IM given by:   Date Additional Medicare IM given:   Additional Medicare IM given by:    Discharge Disposition:  [place]  Per UR Regulation:  Reviewed for med. necessity/level of care/duration of stay  If discussed at [place] of Stay Meetings, dates discussed:    Comments:  ContactDemira, Gwynne Spouse [phone]  07-14-14 9:15pm [place] - [phone] Talked with patient.  Has never had HH previously, lives in [place].  Offered choice of [place] agency - Choose to go with [facility].  Referral made for  pleurix drainage on Monday and Thursday.  Plan for drain tomorrow and probable dc home.

## 2014-07-14 NOTE — Anesthesia Postprocedure Evaluation (Signed)
  Anesthesia Post-op Note  Patient: Dana [name]  Procedure(s) Performed: Procedure(s): INSERTION PLEURAL DRAINAGE CATHETER RIGHT CHEST (Right)  Patient Location: PACU  Anesthesia Type:MAC  Level of Consciousness: awake  Airway and Oxygen Therapy: Patient Spontanous Breathing  Post-op Pain: mild  Post-op Assessment: Post-op Vital signs reviewed  Post-op Vital Signs: Reviewed  Last Vitals:  Filed Vitals:   07/14/14 1019  BP:   Pulse:   Temp: 36.3 C  Resp:     Complications: No apparent anesthesia complications

## 2014-07-14 NOTE — Progress Notes (Signed)
Note: This document was prepared with digital dictation and possible smart phrase technology. Any transcriptional errors that result from this process are unintentional.   Dana [name] KNL:[id number] DOB: [birth year] DOA: 07/13/2014 PCP: [name], MD  Brief narrative: 79 y/o ?, known NSCLC stg IIIb [T2a, N3, M0] on Tarceva followed by Dr. [name], s/p thoracocentesis 07/05/14 yierldin and550 cc fluid. She developed subsequently a mod pneumothorax and finally came to the ED 07/13/14 adn was transferred to [facility] hospital for CVTS input into her care. Ultimately underwent Pleurx catheter placement left side 07/14/14 Repeat chest x-ray showed clearing of hydropneumothorax  Past medical history-As per Problem list Chart reviewed as below-reviewed   Consultants:  Cardiothoracic surgery  Procedures:  Chest tubes left Pleurx placement 7/22  Antibiotics:  Vancomycin 7/22    Subjective  Better, still sleepy from anesthesia Tolerating diet Less short of breath Denies blurred vision double vision weakness unilaterally    Objective    Interim History: None  Telemetry: Sinus rhythm   Objective: Filed Vitals:   07/14/14 0934 07/14/14 0949 07/14/14 1004 07/14/14 1019  BP: 93/53 97/57 115/59   Pulse: 81 75 72   Temp:    97.4 F (36.3 C)  TempSrc:      Resp: 12 12 13    Height:      Weight:      SpO2: 100% 100% 100%     Intake/Output Summary (Last 24 hours) at 07/14/14 1416 Last data filed at 07/14/14 1000  Gross per 24 hour  Intake    775 ml  Output      0 ml  Net    775 ml    Exam:  General: EOMI NCAT Cardiovascular: S1-S2 no murmur rub or gallop Respiratory: Clinically clear, Pleurx catheter site noted on that side straight Abdomen: Soft nontender nondistended no rebound Skin see above Neuro intact  Data Reviewed: Basic Metabolic Panel:  Recent Labs Lab 07/13/14 1012 07/13/14 1855 07/13/14 2023 07/14/14 0409  NA 140  --  145 143  K 4.3  --  4.2  4.1  CL 104  --  103 106  CO2 23  --  26 25  GLUCOSE 85  --  131* 83  BUN 18  --  15 12  CREATININE 0.98 0.84 0.84 0.83  CALCIUM 9.3  --  9.1 8.4   Liver Function Tests:  Recent Labs Lab 07/13/14 2023  AST 24  ALT 23  ALKPHOS 60  BILITOT 0.9  PROT 6.5  ALBUMIN 3.2*   No results found for this basename: LIPASE, AMYLASE,  in the last 168 hours No results found for this basename: AMMONIA,  in the last 168 hours CBC:  Recent Labs Lab 07/13/14 1012 07/13/14 1855 07/13/14 2023 07/14/14 0409  WBC 6.2 5.7 6.8 4.3  NEUTROABS 4.5  --   --   --   HGB 12.4 11.9* 13.1 11.6*  HCT 35.9* 35.0* 39.2 34.5*  MCV 89.1 90.0 90.7 92.2  PLT 329 308 346 275   Cardiac Enzymes: No results found for this basename: CKTOTAL, CKMB, CKMBINDEX, TROPONINI,  in the last 168 hours BNP: No components found with this basename: POCBNP,  CBG: No results found for this basename: GLUCAP,  in the last 168 hours  No results found for this or any previous visit (from the past 240 hour(s)).   Studies:              All Imaging reviewed and is as per above notation   Scheduled Meds: .  aspirin EC  81 mg Oral Daily  . erlotinib  100 mg Oral Q24H  . heparin  5,000 Units Subcutaneous 3 times per day  . hydroxychloroquine  400 mg Oral q morning - 10a  . sodium chloride  3 mL Intravenous Q12H  . tiotropium  18 mcg Inhalation q morning - 10a   Continuous Infusions: . sodium chloride Stopped (07/14/14 0920)     Assessment/Plan:  1. Pneumothorax-repeat chest x-ray 722 within normal limits.  We'll repeat once more in a.m. if cleared and remains the same, home health to arrange for Pleurx catheter drainage as an outpatient with close followup as per Dr [name] input of 2. NSCLC stg IIIb [T2a, N3, M0]-continue outpatient management with Tarceva and other medications. Will follow with Dr. [name]. 3. Advanced COPD, stage C./D.-Continue tiotropium 18 mcg daily-when necessary albuterol but currently  no wheeze 4. Mild normocytic anemia-stable at present time, defer to Dr. [name] 5. Possible impaired glucose tolerance-outpatient management and followup  Code Status: DO NOT RESUSCITATE Family Communication: None at bedside Disposition Plan: Inpatient   Verneita Griffes, MD  [facility] Pager [phone] 07/14/2014, 2:16 PM    LOS: 1 day

## 2014-07-14 NOTE — Brief Op Note (Signed)
      [address]       [place] [address]             [phone]    07/14/2014  9:25 AM  PATIENT:  Dana [name]  79 y.o. female  PRE-OPERATIVE DIAGNOSIS:  right effusion right ptx  POST-OPERATIVE DIAGNOSIS:  same PROCEDURE:  Procedure(s): CHEST TUBE INSERTION (Right) INSERTION PLEURAL DRAINAGE CATHETER (Right) With fluoroscopy   and US guidance   SURGEON:  Surgeon(s) and Role:    * Grace Isaac, MD - Primary   ANESTHESIA:   local and MAC  EBL:  Total I/O In: 400 [I.V.:400] Out: -   BLOOD ADMINISTERED:none  DRAINS: right pleurix drain   LOCAL MEDICATIONS USED:  XYLOCAINE   SPECIMEN:  Source of Specimen:  right pleural fluid  DISPOSITION OF SPECIMEN:  PATHOLOGY  COUNTS:  YES   DICTATION: .Dragon Dictation  PLAN OF CARE: patient already inpatient  PATIENT DISPOSITION:  PACU - hemodynamically stable.   Delay start of Pharmacological VTE agent (>24hrs) due to surgical blood loss or risk of bleeding: yes

## 2014-07-14 NOTE — Progress Notes (Signed)
Going by xray then to floor

## 2014-07-14 NOTE — Op Note (Signed)
NAMEARELIA, VOLPE NO.:  [id number]  MEDICAL RECORD NO.:  [id number]  LOCATION:  2W07C                        FACILITY:  [facility]  PHYSICIAN:  Lanelle Bal, MD    DATE OF BIRTH:  1935/01/15  DATE OF PROCEDURE:  07/14/2014 DATE OF DISCHARGE:                              OPERATIVE REPORT   PREOPERATIVE DIAGNOSIS:  Right iatrogenic pneumothorax and pleural effusion, with history of stage IIIB carcinoma of the lung.  PROCEDURE PERFORMED:  Placement of right PleurX catheter and evacuation of pneumothorax and pleural effusion with ultrasound and fluoroscopic guidance.  SURGEON:  Lanelle Bal, MD  BRIEF HISTORY:  The patient is a 79 year old female with a history of stage IIIB carcinoma of the lung treated with radiation and chemotherapy almost 5 years previously.  More recently, she has been maintained on Tarceva and doing well.  Over the past several years, she has had at least 2 episodes more than a year apart of development of pleural effusion which was drained.  A week prior to this visit, the patient underwent a right thoracentesis and postprocedure had a 25% pneumo. This was decided to be treated with observation, however, the day prior to surgery the patient was readmitted through the emergency room with increasing shortness of breath, and Thoracic Surgery was consulted. Placement of PleurX catheter and possibly a chest tube was discussed with the patient who agreed and signed informed consent.  DESCRIPTION OF PROCEDURE:  The patient was brought to the operating room, placed in supine position.  With light intravenous sedation and after appropriate time-out, the right chest was prepped with Betadine and draped in usual sterile manner.  A preoperative laterality marking was visible.  Using the SonoSite ultrasound, area of pleural fluid posterior and inferior was easily identified.  A 1% lidocaine was infiltrated over the lower right chest, and under  ultrasound guidance, a 16-gauge Angiocath was placed into the pleural space, and a guidewire was introduced.  Fluoroscopy showed the guidewire to be in good position in the right chest.  A second counter incision was made more anteriorly and the PleurX catheter tunneled between the 2 incisions.  Over the guidewire dilators were placed and then through the peel-away sheath the PleurX catheter was introduced into the chest with good position posteriorly and superiorly.  The catheter was drained, approximately 650 mL of milky yellow fluid was withdrawn and submitted for lipid studies and culture and cytology.  In addition, a significant amount of air came from the tube.  Fluoroscopically, it appeared that the pneumothorax had almost completely resolved, so we elected not to place a second temporary chest tube for evacuation of air.  The PleurX catheter was secured in place. Dermabond was placed and dressing applied.  The patient tolerated the procedure without obvious complication.  Blood loss was minimal.  Sponge and needle count was reported as correct.  She was transferred to the recovery room for postoperative observation.     Lanelle Bal, MD     EG/MEDQ  D:  07/14/2014  T:  07/14/2014  Job:  [id number]

## 2014-07-14 NOTE — Transfer of Care (Signed)
Immediate Anesthesia Transfer of Care Note  Patient: Dana [name]  Procedure(s) Performed: Procedure(s): CHEST TUBE INSERTION (Right) INSERTION PLEURAL DRAINAGE CATHETER (Right)  Patient Location: PACU  Anesthesia Type:MAC  Level of Consciousness: awake, alert , oriented and patient cooperative  Airway & Oxygen Therapy: Patient Spontanous Breathing and Patient connected to face mask oxygen  Post-op Assessment: Report given to PACU RN and Post -op Vital signs reviewed and stable  Post vital signs: Reviewed  Complications: No apparent anesthesia complications

## 2014-07-14 NOTE — Anesthesia Preprocedure Evaluation (Addendum)
Anesthesia Evaluation  Patient identified by MRN, date of birth, ID band Patient awake    Reviewed: Allergy & Precautions, H&P , NPO status , Patient's Chart, lab work & pertinent test results  History of Anesthesia Complications Negative for: history of anesthetic complications  Airway Mallampati: II TM Distance: >3 FB Neck ROM: Full    Dental  (+) Teeth Intact, Dental Advisory Given   Pulmonary shortness of breath, COPD COPD inhaler, former smoker,  breath sounds clear to auscultation  + decreased breath sounds      Cardiovascular negative cardio ROS  Rhythm:Regular Rate:Normal     Neuro/Psych negative neurological ROS     GI/Hepatic Neg liver ROS, GERD-  Controlled,  Endo/Other  negative endocrine ROS  Renal/GU negative Renal ROS     Musculoskeletal   Abdominal   Peds  Hematology   Anesthesia Other Findings   Reproductive/Obstetrics negative OB ROS                         Anesthesia Physical Anesthesia Plan  ASA: III  Anesthesia Plan: MAC   Post-op Pain Management:    Induction: Intravenous  Airway Management Planned: Simple Face Mask  Additional Equipment:   Intra-op Plan:   Post-operative Plan:   Informed Consent:   Dental advisory given  Plan Discussed with: CRNA, Anesthesiologist and Surgeon  Anesthesia Plan Comments:        Anesthesia Quick Evaluation

## 2014-07-15 ENCOUNTER — Encounter (HOSPITAL_COMMUNITY): Payer: Self-pay | Admitting: Cardiothoracic Surgery

## 2014-07-15 ENCOUNTER — Inpatient Hospital Stay (HOSPITAL_COMMUNITY): Payer: Medicare Other

## 2014-07-15 LAB — AMYLASE, PLEURAL FLUID: Amylase, Pleural Fluid: 17 U/L

## 2014-07-15 MED ORDER — OXYCODONE HCL 5 MG PO TABS: 5.0000 mg | ORAL_TABLET | ORAL | Status: DC | PRN

## 2014-07-15 NOTE — Progress Notes (Signed)
07/15/2014 0800  Nursing note Verbal orders Jadene Pierini PAC not to drain Pleurx today since drained in OR yesterday. Resume Monday per prior orders.  Samaria Anes, Arville Lime

## 2014-07-15 NOTE — Progress Notes (Signed)
07/15/2014 11:20 AM Nursing note Pt. And husband viewed educational video #[id number] on Pleurx catheters. Questions and concerns addressed.  Kelsy Polack, Arville Lime

## 2014-07-15 NOTE — Progress Notes (Signed)
07/15/2014 1200 Nursing note Discharge avs form, medications already taken today and those due this evening given and explained to patient and family. Follow up appointments, home health arrangements for Pleurx management and when to call MD reviewed. Incision site care reviewed. rx given to patient and explained. Questions and concerns addressed. D/c iv line. D/c tele. D/c  Home with husband per orders. Pt. Received one box of Pleurx drainage kits at discharge.  Tacy Chavis, Arville Lime

## 2014-07-15 NOTE — Discharge Summary (Signed)
Physician Discharge Summary  Dana [name] IRW:[id number] DOB: [birth year] DOA: 07/13/2014  PCP: [name], MD  Admit date: 07/13/2014 Discharge date: 07/15/2014  Time spent: 35 minutes  Recommendations for Outpatient Follow-up:  1. Patient will go home with Pleurx drain and this will be drained by weekly as per instructions from CTS.  2. Please followup with Dr. Servando Snare and get necessary x-rays when needed 3. Patient should have screening lab tests done in about one to 2 weeks in followup and care physician-complete metabolic panel, CBC-also please followup cytology from Pleurx catheter drainage 4. Please followup with oncologist for interval care and continue chemotherapy as per his discretion 5. Consider HbA1c is an outpatient   Discharge Diagnoses:  Principal Problem:   Pneumothorax, iatrogenic Active Problems:   Malignant neoplasm of bronchus and lung, unspecified site   Pleural effusion, right   Respiratory distress   Pneumothorax   Discharge Condition: Good  Diet recommendation: Heart healthy  Filed Weights   07/13/14 1613 07/14/14 0455 07/15/14 0507  Weight: 54.976 kg (121 lb 3.2 oz) 54.84 kg (120 lb 14.4 oz) 56.7 kg (125 lb)    History of present illness:  79 y/o ?, known NSCLC stg IIIb [T2a, N3, M0] on Tarceva followed by Dr. [name], s/p thoracocentesis 07/05/14 yield 550 cc fluid. She developed subsequently a mod pneumothorax and finally came to the [facility] long ED 07/13/14 adn was transferred to [facility] hospital for CVTS input into her care. Ultimately underwent Pleurx catheter placement left side 07/14/14  Repeat chest x-ray showed clearing of hydropneumothorax Please see below  Hospital Course:   1. Pneumothorax-repeat chest x-ray 7 07/15/14 home health to arrange for Pleurx catheter drainage as an outpatient with close followup as per Dr [name] input 2. NSCLC stg IIIb [T2a, N3, M0]-continue outpatient management with Tarceva and other medications.  Will follow with Dr. [name]. I will cc him on this note to arrange interval followup 3. Advanced COPD, stage C./D.-Continue tiotropium 18 mcg daily-when necessary albuterol but currently no wheeze 4. Mild normocytic anemia-stable at present time, defer to Dr. [name] 5. Possible impaired glucose tolerance-outpatient management and followup with primary care physician for further management   Procedures:  Pleurx catheter drain  Consultations:  Cardiothoracic surgeon-Dr. Servando Snare  Discharge Exam: Filed Vitals:   07/15/14 0623  BP: 99/58  Pulse:   Temp:   Resp:    Feels much better Ambulated a little yesterday Tolerating diet 1 stable   General: Alert pleasant oriented no apparent distress Cardiovascular: S1-S2 no murmur rub or gallop Respiratory: Clear  Discharge Instructions You were cared for by a hospitalist during your hospital stay. If you have any questions about your discharge medications or the care you received while you were in the hospital after you are discharged, you can call the unit and asked to speak with the hospitalist on call if the hospitalist that took care of you is not available. Once you are discharged, your primary care physician will handle any further medical issues. Please note that NO REFILLS for any discharge medications will be authorized once you are discharged, as it is imperative that you return to your primary care physician (or establish a relationship with a primary care physician if you do not have one) for your aftercare needs so that they can reassess your need for medications and monitor your lab values.     Medication List         aspirin EC 81 MG tablet  Take 81 mg by mouth daily.  B-12 1000 MCG Caps  Take 1,000 mcg by mouth 3 (three) times daily with meals.     Calcium Carbonate-Vitamin D 600-400 MG-UNIT per chew tablet  Chew 1 tablet by mouth 2 (two) times daily.     cholecalciferol 1000 UNITS tablet  Commonly known as:   VITAMIN D  Take 1,000 Units by mouth daily.     conjugated estrogens vaginal cream  Commonly known as:  PREMARIN  Place vaginally once a week.     CoQ10 100 MG Caps  Take 1 capsule by mouth daily.     erlotinib 100 MG tablet  Commonly known as:  TARCEVA  Take 1 tablet (100 mg total) by mouth every evening. Take on an empty stomach 1 hour before meals or 2 hours after     GAS-X PO  Take 1 tablet by mouth 2 (two) times daily as needed (gas.).     hydroxychloroquine 200 MG tablet  Commonly known as:  PLAQUENIL  Take 400 mg by mouth every morning.     loperamide 2 MG tablet  Commonly known as:  IMODIUM A-D  Take 1 mg by mouth 4 (four) times daily as needed for diarrhea or loose stools.     loratadine 10 MG tablet  Commonly known as:  CLARITIN  Take 10 mg by mouth daily as needed for allergies.     LORazepam 0.5 MG tablet  Commonly known as:  ATIVAN  Take 0.5 mg by mouth every 8 (eight) hours as needed for anxiety.     oxyCODONE 5 MG immediate release tablet  Commonly known as:  Oxy IR/ROXICODONE  Take 1 tablet (5 mg total) by mouth every 4 (four) hours as needed for moderate pain.     SYSTANE OP  Place 1 drop into both eyes at bedtime as needed (for dry eyes.).     tiotropium 18 MCG inhalation capsule  Commonly known as:  SPIRIVA  Place 18 mcg into inhaler and inhale every morning.     VICODIN 5-300 MG Tabs  Generic drug:  Hydrocodone-Acetaminophen  Take 1 tablet by mouth 4 (four) times daily as needed (pain.).       Allergies  Allergen Reactions  . Amoxicillin Diarrhea    Hx of C Diff        Follow-up Information   Follow up with GERHARDT,[name], MD. (07/27/2014 at 3:30 pm, to see Dr Servando Snare. Please obtain a chest xray at [facility]( located in the same office complex) at 2:30 )    Specialty:  Cardiothoracic Surgery   Contact information:   [address] [place] [address] [phone]        The results of significant  diagnostics from this hospitalization (including imaging, microbiology, ancillary and laboratory) are listed below for reference.    Significant Diagnostic Studies: Dg Chest 1 View  07/05/2014   CLINICAL DATA:  Moderate right-sided pneumothorax following right thoracentesis.  EXAM: CHEST - 1 VIEW  COMPARISON:  07/05/2014 and prior exams  FINDINGS: A moderate right pneumothorax and is not changed from the film earlier this day.  Right pleural fluid is again noted and unchanged.  Scarring within the medial right lung is again identified.  The left lung is clear. There is no evidence of left pleural effusion.  No acute bony abnormalities are present. A thoracolumbar scoliosis is again noted.  IMPRESSION: Unchanged moderate right pneumothorax and right pleural fluid.   Electronically Signed   By: Coral Spikes.D.  On: 07/05/2014 13:14   Dg Chest 1 View  07/05/2014   CLINICAL DATA:  Status post right thoracentesis  EXAM: CHEST - 1 VIEW  COMPARISON:  Prior chest CT 06/28/2014; Prior chest x-ray 07/21/2012  FINDINGS: Moderate right-sided pneumothorax with evidence of volume loss in the right lower and middle lobes. The left lung remains clear. There is been significant decrease in volume of the right pleural effusion. The cardiac and mediastinal contours are unchanged. No acute osseous abnormality.  IMPRESSION: 1. Moderate-sized right pneumothorax following right-sided thoracentesis. Given the known chronic right pleural effusion, this may be ex vacuo in nature. A repeat chest x-ray will be obtained in 1 hr to evaluate for progressive collapse.   Electronically Signed   By: Jacqulynn Cadet M.D.   On: 07/05/2014 11:51   Dg Chest 2 View  07/15/2014   CLINICAL DATA:  Pneumothorax.  EXAM: CHEST  2 VIEW  COMPARISON:  07/14/2014.  FINDINGS: Support apparatus: RIGHT pleural catheter appear similar. Monitoring leads project over the chest.  Cardiomediastinal silhouette: Unchanged.  Lungs: Volume loss at the RIGHT lung  base. Hyperinflation consistent with emphysema. Tiny RIGHT apical pneumothorax, barely perceptible.  Effusions:  Unchanged small RIGHT effusion.  Other:  None.  IMPRESSION: 1. Unchanged RIGHT sided pleural catheter with the tip at the apex. 2. Tiny RIGHT apical pneumothorax. 3. Small RIGHT pleural effusion.   Electronically Signed   By: Dereck Ligas M.D.   On: 07/15/2014 07:51   Dg Chest 2 View  07/14/2014   CLINICAL DATA:  Placement of chest tube. History of right hydropneumothorax  EXAM: CHEST  2 VIEW  COMPARISON:  07/13/2014  FINDINGS: A right pleural drain has been placed. The tube extends around the right lung apex and the tip appears to be in the medial right chest region. Large amount of the pleural air has been removed. There appears to be a pleural line in the medial right chest. Stable tenting at the right hemidiaphragm but decreased right pleural fluid. Left lung is clear. Heart size is normal.  IMPRESSION: Placement of a right pleural catheter. Markedly decreased or resolved right pleural air.  Decreased right pleural fluid.   Electronically Signed   By: Markus Daft M.D.   On: 07/14/2014 10:52   Dg Chest 2 View  07/13/2014   CLINICAL DATA:  Followup pneumothorax.  EXAM: CHEST  2 VIEW  COMPARISON:  07/09/2014  FINDINGS: Stable moderate-sized right-sided hydro pneumothorax estimated at 25%. The left lung remains clear. The cardiac silhouette, mediastinal and hilar contours are Stable.  IMPRESSION: Persistent right-sided hydropneumothorax estimated at 25 percent.   Electronically Signed   By: Kalman Jewels M.D.   On: 07/13/2014 10:16   Dg Chest 2 View  07/09/2014   CLINICAL DATA:  History of right thoracentesis with subsequent pneumothorax.  EXAM: CHEST  2 VIEW  COMPARISON:  PA and lateral chest 07/05/2014.  FINDINGS: Right hydropneumothorax is again seen. Degree of collapse of the right lung is not markedly changed with its apex again seen projecting at the level of the posterior arc of the  right fifth rib. Left lung remains expanded and clear. Heart size is normal.  IMPRESSION: Persistent right hydropneumothorax. Degree of collapse of the right lung does not appear changed.   Electronically Signed   By: Inge Rise M.D.   On: 07/09/2014 08:38   Ct Chest W Contrast  06/28/2014   CLINICAL DATA:  Lung cancer diagnosed 2010, chemotherapy complete, shortness of breath with exercise  EXAM: CT  CHEST WITH CONTRAST  TECHNIQUE: Multidetector CT imaging of the chest was performed during intravenous contrast administration.  CONTRAST:  44mL OMNIPAQUE IOHEXOL 300 MG/ML  SOLN  COMPARISON:  03/24/2014  FINDINGS: Right paramediastinal radiation changes with volume loss in the right lower lung. Moderate right pleural effusion, grossly unchanged.  No suspicious pulmonary nodules.  No pneumothorax.  Visualized thyroid is unremarkable.  The heart is normal in size. Stable trace fluid along the anterior pericardium (series 2/image 32).  No suspicious mediastinal, hilar, or axillary lymphadenopathy.  Visualized upper abdomen is notable for a mildly nodular hepatic contour, nonspecific.  Degenerative changes of the visualized thoracolumbar spine. Thoracic levoscoliosis.  IMPRESSION: Stable right paramediastinal radiation changes with volume loss in the right hemithorax.  Moderate right pleural effusion, unchanged.  No evidence of recurrent or metastatic disease in the chest.   Electronically Signed   By: Julian Hy M.D.   On: 06/28/2014 11:16   US Thoracentesis Asp Pleural Space W/img Guide  07/05/2014   CLINICAL DATA:  History of lung cancer s/p previous radiation and chemotherapy, shortness of breath, request for thoracentesis.  EXAM: ULTRASOUND GUIDED RIGHT THERAPEUTIC THORACENTESIS  COMPARISON:  Right thoracentesis 06/2012 650 ml chylous fluid.  PROCEDURE: An ultrasound guided thoracentesis was thoroughly discussed with the patient and questions answered. The benefits, risks, alternatives and  complications were also discussed. The patient understands and wishes to proceed with the procedure. Written consent was obtained.  Ultrasound was performed to localize and mark an adequate pocket of fluid in the right chest. The area was then prepped and draped in the normal sterile fashion. 1% Lidocaine was used for local anesthesia. Under ultrasound guidance a 19 gauge Yueh catheter was introduced. Thoracentesis was performed removing 200 ml of chylous fluid. The catheter was removed and US imaging revealed a separate lower fluid pocket that did not communicate and drain with the above access. I discussed these findings with the patient and she wished to proceed with a second catheter insertion to remove all fluid. Ultrasound was performed to localize and mark an adequate pocket of fluid in the right chest inferior from previous site. The area was then prepped and draped in the normal sterile fashion. 1% Lidocaine was used for local anesthesia. Under ultrasound guidance a 19 gauge Yueh catheter was introduced. Thoracentesis was performed removing 393ml. Total removal of fluid 550 ml. Dressing was applied to both access sites.  Complications: Post CXR revealed moderate sized pneumothorax, given the known chronic right pleural effusion by review of previous CT chest from this month and dating back to the year prior, this may be ex vacuo in nature. Dr. Laurence Ferrari reviewed the images and recommended the patient stay to be monitored and oxygen 2L [place] was placed and a repeat chest x-ray in 1 hr was ordered to evaluate for progressive collapse. A 1 hour follow up CXR revealed unchanged, stable right moderate PTX. The patient's oxygen saturation remained 100% on 2L and also on RA. She denied any right sided chest or back pain, shortness of breath or difficulty taking a deep breath. She denied any lightheadedness or dizziness. I have discussed the above with Dr. Laurence Ferrari, imaging studies and the patient's vitals and lack  of symptoms are consistent with an ex vacuo as the pleural lining may have developed some scarring overtime and is likely stuck. This lack of expansion does make the space more susceptible to fluid re accumulation overtime. She was discharged home and encouraged to use her inspiratory spirometer that she has  at home. She was instructed to present to the ED if she developed any chest pain, shortness of breath or change in baseline. The IR PA will contact the patient tomorrow 07/06/14 at the number given [phone]. She will follow up as an outpatient on 07/09/14 to [facility] for a CXR and evaluation. I have discussed all of the above with the patient who states understanding.  FINDINGS: A total of approximately 550 ml of chylous fluid was removed. A fluid sample was not sent for laboratory analysis.  IMPRESSION: Successful ultrasound guided right thoracentesis yielding 550 ml of pleural fluid.  Read By:  Tsosie Billing PA-C   Electronically Signed   By: Jacqulynn Cadet M.D.   On: 07/05/2014 14:48    Microbiology: Recent Results (from the past 240 hour(s))  BODY FLUID CULTURE     Status: None   Collection Time    07/14/14  9:13 AM      Result Value Ref Range Status   Specimen Description PLEURAL FLUID RIGHT   Final   Special Requests NONE   Final   Gram Stain     Final   Value: MODERATE WBC PRESENT,BOTH PMN AND MONONUCLEAR     NO ORGANISMS SEEN     Performed at [facility]   Culture PENDING   Incomplete   Report Status PENDING   Incomplete     Labs: Basic Metabolic Panel:  Recent Labs Lab 07/13/14 1012 07/13/14 1855 07/13/14 2023 07/14/14 0409  NA 140  --  145 143  K 4.3  --  4.2 4.1  CL 104  --  103 106  CO2 23  --  26 25  GLUCOSE 85  --  131* 83  BUN 18  --  15 12  CREATININE 0.98 0.84 0.84 0.83  CALCIUM 9.3  --  9.1 8.4   Liver Function Tests:  Recent Labs Lab 07/13/14 2023  AST 24  ALT 23  ALKPHOS 60  BILITOT 0.9  PROT 6.5  ALBUMIN 3.2*   No  results found for this basename: LIPASE, AMYLASE,  in the last 168 hours No results found for this basename: AMMONIA,  in the last 168 hours CBC:  Recent Labs Lab 07/13/14 1012 07/13/14 1855 07/13/14 2023 07/14/14 0409  WBC 6.2 5.7 6.8 4.3  NEUTROABS 4.5  --   --   --   HGB 12.4 11.9* 13.1 11.6*  HCT 35.9* 35.0* 39.2 34.5*  MCV 89.1 90.0 90.7 92.2  PLT 329 308 346 275   Cardiac Enzymes: No results found for this basename: CKTOTAL, CKMB, CKMBINDEX, TROPONINI,  in the last 168 hours BNP: BNP (last 3 results) No results found for this basename: PROBNP,  in the last 8760 hours CBG: No results found for this basename: GLUCAP,  in the last 168 hours     Signed:  Nita Sells  [facility] 07/15/2014, 9:08 AM

## 2014-07-15 NOTE — Progress Notes (Signed)
07/15/2014 0810 Dr. Servando Snare at bedside. Verbal orders to drain pleurx today prior to discharge. Orders enacted. 250 Ml of yellow fluid removed. Drainage had to be stopped due to patient discomfort. Dr. Servando Snare paged and made aware of findings. No new orders. Will continue to monitor patient.  Toni Hoffmeister, Arville Lime

## 2014-07-15 NOTE — Progress Notes (Addendum)
[address]       [place] [address]             [phone]      1 Day Post-Op Procedure(s) (LRB): INSERTION PLEURAL DRAINAGE CATHETER RIGHT CHEST (Right) Subjective: Feels good, wants to go home  Objective: Vital signs in last 24 hours: Temp:  [97.4 F (36.3 C)-99.7 F (37.6 C)] 97.4 F (36.3 C) (07/23 0507) Pulse Rate:  [71-96] 83 (07/23 0507) Cardiac Rhythm:  [-] Normal sinus rhythm;Sinus tachycardia (07/22 2000) Resp:  [12-18] 18 (07/23 0507) BP: (85-115)/(47-59) 99/58 mmHg (07/23 0623) SpO2:  [92 %-100 %] 92 % (07/23 0507) Weight:  [125 lb (56.7 kg)] 125 lb (56.7 kg) (07/23 0507)  Hemodynamic parameters for last 24 hours:    Intake/Output from previous day: 07/22 0701 - 07/23 0700 In: 895 [P.O.:120; I.V.:775] Out: -  Intake/Output this shift:    General appearance: alert, cooperative and no distress Heart: regular rate and rhythm Lungs: clear to auscultation bilaterally Abdomen: benign Extremities: no edema Wound: site looks fine  Lab Results:  Recent Labs  07/13/14 2023 07/14/14 0409  WBC 6.8 4.3  HGB 13.1 11.6*  HCT 39.2 34.5*  PLT 346 275   BMET:  Recent Labs  07/13/14 2023 07/14/14 0409  NA 145 143  K 4.2 4.1  CL 103 106  CO2 26 25  GLUCOSE 131* 83  BUN 15 12  CREATININE 0.84 0.83  CALCIUM 9.1 8.4    PT/INR:  Recent Labs  07/13/14 2023  LABPROT 14.1  INR 1.09   ABG    Component Value Date/Time   HCO3 22.8 11/25/2007 0819   TCO2 24 11/25/2007 0819   CBG (last 3)  No results found for this basename: GLUCAP,  in the last 72 hours Scheduled Meds: . aspirin EC  81 mg Oral Daily  . erlotinib  100 mg Oral Q24H  . heparin  5,000 Units Subcutaneous 3 times per day  . hydroxychloroquine  400 mg Oral q morning - 10a  . sodium chloride  3 mL Intravenous Q12H  . tiotropium  18 mcg Inhalation q morning - 10a   Continuous Infusions: . sodium chloride Stopped (07/14/14 0920)   PRN Meds:.acetaminophen,  acetaminophen, alum & mag hydroxide-simeth, LORazepam, morphine injection, ondansetron (ZOFRAN) IV, ondansetron, oxyCODONE, traMADol  Dg Chest 2 View  07/15/2014   CLINICAL DATA:  Pneumothorax.  EXAM: CHEST  2 VIEW  COMPARISON:  07/14/2014.  FINDINGS: Support apparatus: RIGHT pleural catheter appear similar. Monitoring leads project over the chest.  Cardiomediastinal silhouette: Unchanged.  Lungs: Volume loss at the RIGHT lung base. Hyperinflation consistent with emphysema. Tiny RIGHT apical pneumothorax, barely perceptible.  Effusions:  Unchanged small RIGHT effusion.  Other:  None.  IMPRESSION: 1. Unchanged RIGHT sided pleural catheter with the tip at the apex. 2. Tiny RIGHT apical pneumothorax. 3. Small RIGHT pleural effusion.   Electronically Signed   By: Dereck Ligas M.D.   On: 07/15/2014 07:51   Dg Chest 2 View  07/14/2014   CLINICAL DATA:  Placement of chest tube. History of right hydropneumothorax  EXAM: CHEST  2 VIEW  COMPARISON:  07/13/2014  FINDINGS: A right pleural drain has been placed. The tube extends around the right lung apex and the tip appears to be in the medial right chest region. Large amount of the pleural air has been removed. There appears to be a pleural line in the medial right chest. Stable tenting at the right hemidiaphragm but  decreased right pleural fluid. Left lung is clear. Heart size is normal.  IMPRESSION: Placement of a right pleural catheter. Markedly decreased or resolved right pleural air.  Decreased right pleural fluid.   Electronically Signed   By: Markus Daft M.D.   On: 07/14/2014 10:52   Dg Chest 2 View  07/13/2014   CLINICAL DATA:  Followup pneumothorax.  EXAM: CHEST  2 VIEW  COMPARISON:  07/09/2014  FINDINGS: Stable moderate-sized right-sided hydro pneumothorax estimated at 25%. The left lung remains clear. The cardiac silhouette, mediastinal and hilar contours are Stable.  IMPRESSION: Persistent right-sided hydropneumothorax estimated at 25 percent.    Electronically Signed   By: Kalman Jewels M.D.   On: 07/13/2014 10:16   Assessment/Plan: S/P Procedure(s) (LRB): INSERTION PLEURAL DRAINAGE CATHETER RIGHT CHEST (Right)  1 can be d/c'd from our viewpoint at any time.   LOS: 2 days    Dana [name],Dana [name] 07/15/2014  Home today I have seen and examined Dana [name] and agree with the above assessment  and plan.  Grace Isaac MD Beeper [phone] Office [phone] 07/15/2014 8:56 AM

## 2014-07-17 LAB — BODY FLUID CULTURE: Culture: NO GROWTH

## 2014-07-18 LAB — OTHER BODY FLUID CHEMISTRY

## 2014-07-22 ENCOUNTER — Other Ambulatory Visit: Payer: Self-pay | Admitting: Cardiothoracic Surgery

## 2014-07-22 DIAGNOSIS — C3490 Malignant neoplasm of unspecified part of unspecified bronchus or lung: Secondary | ICD-10-CM

## 2014-07-25 DIAGNOSIS — Z483 Aftercare following surgery for neoplasm: Secondary | ICD-10-CM

## 2014-07-25 DIAGNOSIS — J90 Pleural effusion, not elsewhere classified: Secondary | ICD-10-CM

## 2014-07-27 ENCOUNTER — Ambulatory Visit (INDEPENDENT_AMBULATORY_CARE_PROVIDER_SITE_OTHER): Payer: Medicare Other | Admitting: Cardiothoracic Surgery

## 2014-07-27 ENCOUNTER — Ambulatory Visit
Admission: RE | Admit: 2014-07-27 | Discharge: 2014-07-27 | Disposition: A | Discharge status: Home / Self Care | Payer: Medicare Other | Source: Ambulatory Visit | Attending: Cardiothoracic Surgery | Admitting: Cardiothoracic Surgery

## 2014-07-27 ENCOUNTER — Encounter: Payer: Self-pay | Admitting: Cardiothoracic Surgery

## 2014-07-27 VITALS — BP 108/70 | HR 88 | Ht 64.0 in | Wt 125.0 lb

## 2014-07-27 DIAGNOSIS — J90 Pleural effusion, not elsewhere classified: Secondary | ICD-10-CM

## 2014-07-27 DIAGNOSIS — J9383 Other pneumothorax: Secondary | ICD-10-CM

## 2014-07-27 DIAGNOSIS — J939 Pneumothorax, unspecified: Secondary | ICD-10-CM

## 2014-07-27 DIAGNOSIS — C3490 Malignant neoplasm of unspecified part of unspecified bronchus or lung: Secondary | ICD-10-CM

## 2014-07-27 NOTE — Progress Notes (Signed)
[address]       [place] [address]             [phone]      Dana [name] Medical Record #[id number] Date of Birth: 1935/05/29  Referring:Dr Mohamed Primary Care: [name], MD  Chief Complaint:   POST OP FOLLOW UP 07/14/2014  OPERATIVE REPORT  PREOPERATIVE DIAGNOSIS: Right iatrogenic pneumothorax and pleural  effusion, with history of stage IIIB carcinoma of the lung.  PROCEDURE PERFORMED: Placement of right PleurX catheter and evacuation  of pneumothorax and pleural effusion with ultrasound and fluoroscopic  guidance.  SURGEON: Lanelle Bal, MD  History of Present Illness:     Patient returns to the office after placement of a Pleurx catheter for a combination right pneumothorax and recurrent right pleural effusion. She notes that after she was initially treated with a Pleurx she was fatigued and short of breath. This has improved with drainage. She notes she walked a mile today without difficulty. The Pleurx is been drained on Mondays and Thursdays, 700 mL, 300 mL, 50 mL the last 3 times and has been drained. Pleural fluid pathology is noted: Diagnosis PLEURAL FLUID, RIGHT (SPECIMEN 1 OF 1 COLLECTED ON 07/14/2014): NEGATIVE FOR CARCINOMA. LYMPHOCYTOSIS WITH SCATTERED ACUTE INFLAMMATORY CELLS PRESENT. Tissue-Flow Cytometry - PREDOMINANCE OF T LYMPHOCYTES WITH CD4 PHENOTYPE. - NO MONOCLONAL B-CELL POPULATION IDENTIFIED.   Past Medical History  Diagnosis Date  . Hemorrhoid   . COPD (chronic obstructive pulmonary disease)   . GERD (gastroesophageal reflux disease)   . History of lung cancer ONCOLOGIST--  DR [facility]--  LAST CT ,  NO RECURRENCE OR METS    DX JAN 2010 --  STAGE IIIA  NON-SMALL CELL ADENOCARCINOMA (RIGHT MIDDLE LOBE)---  S/P  CHEMORADIATIO THERAPY  (COMPLETE 03-21-2009)  . History of anal fissures   . Arthritis     HANDS,  WRISTS  . Osteoporosis   . Right wrist pain     MASS  . Dyspnea on exertion   . Normal  cardiac stress test     2007  PER PT  . Wears glasses   . Rash, skin     RIGHT FOREARM/ HAND  . Cancer      History  Smoking status  . Former Smoker -- 1.00 packs/day for 30 years  . Types: Cigarettes  . Quit date: 12/24/1977  Smokeless tobacco  . Never Used    History  Alcohol Use No     Allergies  Allergen Reactions  . Amoxicillin Diarrhea    Hx of C Diff     Current Outpatient Prescriptions  Medication Sig Dispense Refill  . aspirin EC 81 MG tablet Take 81 mg by mouth daily.      . Calcium Carbonate-Vitamin D 600-400 MG-UNIT per chew tablet Chew 1 tablet by mouth 2 (two) times daily.      . cholecalciferol (VITAMIN D) 1000 UNITS tablet Take 1,000 Units by mouth daily.       . Coenzyme Q10 (COQ10) 100 MG CAPS Take 1 capsule by mouth daily.       Marland Kitchen conjugated estrogens (PREMARIN) vaginal cream Place vaginally once a week.        . Cyanocobalamin (B-12) 1000 MCG CAPS Take 1,000 mcg by mouth 3 (three) times daily with meals.       . erlotinib (TARCEVA) 100 MG tablet Take 1 tablet (100 mg total) by mouth every evening. Take on an empty stomach 1 hour before  meals or 2 hours after  30 tablet  1  . Hydrocodone-Acetaminophen (VICODIN) 5-300 MG TABS Take 1 tablet by mouth 4 (four) times daily as needed (pain.).      Marland Kitchen hydroxychloroquine (PLAQUENIL) 200 MG tablet Take 400 mg by mouth every morning.       Marland Kitchen LORazepam (ATIVAN) 0.5 MG tablet Take 0.5 mg by mouth every 8 (eight) hours as needed for anxiety.      Marland Kitchen oxyCODONE (OXY IR/ROXICODONE) 5 MG immediate release tablet Take 1 tablet (5 mg total) by mouth every 4 (four) hours as needed for moderate pain.  30 tablet  0  . Polyethyl Glycol-Propyl Glycol (SYSTANE OP) Place 1 drop into both eyes at bedtime as needed (for dry eyes.).       Marland Kitchen Simethicone (GAS-X PO) Take 1 tablet by mouth 2 (two) times daily as needed (gas.).      Marland Kitchen tiotropium (SPIRIVA) 18 MCG inhalation capsule Place 18 mcg into inhaler and inhale every morning.       .  loperamide (IMODIUM A-D) 2 MG tablet Take 1 mg by mouth 4 (four) times daily as needed for diarrhea or loose stools.       Marland Kitchen loratadine (CLARITIN) 10 MG tablet Take 10 mg by mouth daily as needed for allergies.        No current facility-administered medications for this visit.       Physical Exam: BP 108/70  Pulse 88  Ht 5\' 4"  (1.626 m)  Wt 125 lb (56.7 kg)  BMI 21.45 kg/m2  SpO2 89%  General appearance: alert, cooperative and no distress Neurologic: intact Heart: regular rate and rhythm, S1, S2 normal, no murmur, click, rub or gallop Lungs: diminished breath sounds RLL Abdomen: soft, non-tender; bowel sounds normal; no masses,  no organomegaly Extremities: extremities normal, atraumatic, no cyanosis or edema and Homans sign is negative, no sign of DVT Wound: Pleurx catheter is intact   Diagnostic Studies & Laboratory data:     Recent Radiology Findings:   Dg Chest 2 View  07/27/2014   CLINICAL DATA:  History of lung carcinoma diagnosed in 2010 with chemotherapy, followup  EXAM: CHEST  2 VIEW  COMPARISON:  Chest x-ray of 07/15/2014 and CT chest of 06/28/2014  FINDINGS: There has been some increase in volume of the small right pleural effusion with right PleurX catheter noted. The tip of the catheter on the lateral view appears to be superiorly and anteriorly positioned extending into the retrosternal airspace. The left lung is clear. Vague opacity in the right infrahilar region may be related to atelectasis but attention to this area on followup chest x-ray is recommended. Heart size is stable. There is a mild thoracolumbar scoliosis present.  IMPRESSION: 1. Increase in volume of small right pleural effusion with right PleurX catheter positioned as described above. 2. Probable mild atelectasis in the right infrahilar region. Recommend attention to this area on followup chest x-ray   Electronically Signed   By: Ivar Drape M.D.   On: 07/27/2014 14:36      Recent Lab Findings: Lab  Results  Component Value Date   WBC 4.3 07/14/2014   HGB 11.6* 07/14/2014   HCT 34.5* 07/14/2014   PLT 275 07/14/2014   GLUCOSE 83 07/14/2014   CHOL  Value: 225        ATP III CLASSIFICATION:  <200     mg/dL   Desirable  200-239  mg/dL   Borderline High  >=240    mg/dL  High* 12/23/2008   TRIG 94 12/23/2008   HDL 64 12/23/2008   LDLCALC  Value: 142        Total Cholesterol/HDL:CHD Risk Coronary Heart Disease Risk Table                     Men   Women  1/2 Average Risk   3.4   3.3* 12/23/2008   ALT 23 07/13/2014   AST 24 07/13/2014   NA 143 07/14/2014   K 4.1 07/14/2014   CL 106 07/14/2014   CREATININE 0.83 07/14/2014   BUN 12 07/14/2014   CO2 25 07/14/2014   TSH 2.661 11/10/2012   INR 1.09 07/13/2014   HGBA1C  Value: 5.8 (NOTE)   The ADA recommends the following therapeutic goal for glycemic   control related to Hgb A1C measurement:   Goal of Therapy:   < 7.0% Hgb A1C   Reference: American Diabetes Association: Clinical Practice   Recommendations 2008, Diabetes Care,  2008, 31:(Suppl 1). 12/23/2008      Assessment / Plan:     Patient doing well following placement of right Pleurx catheter for combined hydropneumothorax after attempted thoracentesis. The drainage is decreasing, her functional status is improved. We'll continue draining the catheter for an additional 10 days, and get a followup film. If the drainage continues to be 50 mL or less we'll plan on removing the catheter in the near future. I plan to see the patient back in approximately 10 days       Grace Isaac MD      [address] [place] [address] Office [phone]   [place]  07/27/2014 4:00 PM

## 2014-08-11 ENCOUNTER — Other Ambulatory Visit: Payer: Self-pay | Admitting: Cardiothoracic Surgery

## 2014-08-11 ENCOUNTER — Encounter: Payer: Self-pay | Admitting: Internal Medicine

## 2014-08-11 ENCOUNTER — Telehealth: Payer: Self-pay | Admitting: Internal Medicine

## 2014-08-11 ENCOUNTER — Ambulatory Visit (HOSPITAL_BASED_OUTPATIENT_CLINIC_OR_DEPARTMENT_OTHER): Payer: Medicare Other | Admitting: Internal Medicine

## 2014-08-11 ENCOUNTER — Other Ambulatory Visit (HOSPITAL_BASED_OUTPATIENT_CLINIC_OR_DEPARTMENT_OTHER): Payer: Medicare Other

## 2014-08-11 VITALS — BP 95/58 | HR 77 | Temp 98.4°F | Resp 18 | Ht 64.0 in | Wt 119.5 lb

## 2014-08-11 DIAGNOSIS — C342 Malignant neoplasm of middle lobe, bronchus or lung: Secondary | ICD-10-CM

## 2014-08-11 DIAGNOSIS — C3490 Malignant neoplasm of unspecified part of unspecified bronchus or lung: Secondary | ICD-10-CM

## 2014-08-11 LAB — COMPREHENSIVE METABOLIC PANEL (CC13)
ALBUMIN: 2.7 g/dL — AB (ref 3.5–5.0)
ALK PHOS: 51 U/L (ref 40–150)
ALT: 23 U/L (ref 0–55)
AST: 28 U/L (ref 5–34)
Anion Gap: 6 mEq/L (ref 3–11)
BUN: 20.6 mg/dL (ref 7.0–26.0)
CO2: 28 mEq/L (ref 22–29)
CREATININE: 1 mg/dL (ref 0.6–1.1)
Calcium: 9 mg/dL (ref 8.4–10.4)
Chloride: 107 mEq/L (ref 98–109)
Glucose: 95 mg/dl (ref 70–140)
POTASSIUM: 4.5 meq/L (ref 3.5–5.1)
Sodium: 141 mEq/L (ref 136–145)
Total Bilirubin: 0.81 mg/dL (ref 0.20–1.20)
Total Protein: 5.4 g/dL — ABNORMAL LOW (ref 6.4–8.3)

## 2014-08-11 LAB — CBC WITH DIFFERENTIAL/PLATELET
BASO%: 0.2 % (ref 0.0–2.0)
BASOS ABS: 0 10*3/uL (ref 0.0–0.1)
EOS%: 7.9 % — AB (ref 0.0–7.0)
Eosinophils Absolute: 0.5 10*3/uL (ref 0.0–0.5)
HCT: 36.9 % (ref 34.8–46.6)
HEMOGLOBIN: 12.4 g/dL (ref 11.6–15.9)
LYMPH%: 10.3 % — ABNORMAL LOW (ref 14.0–49.7)
MCH: 30.5 pg (ref 25.1–34.0)
MCHC: 33.6 g/dL (ref 31.5–36.0)
MCV: 90.9 fL (ref 79.5–101.0)
MONO#: 0.6 10*3/uL (ref 0.1–0.9)
MONO%: 9.5 % (ref 0.0–14.0)
NEUT#: 4.6 10*3/uL (ref 1.5–6.5)
NEUT%: 72.1 % (ref 38.4–76.8)
Platelets: 301 10*3/uL (ref 145–400)
RBC: 4.06 10*6/uL (ref 3.70–5.45)
RDW: 14.7 % — ABNORMAL HIGH (ref 11.2–14.5)
WBC: 6.3 10*3/uL (ref 3.9–10.3)
lymph#: 0.7 10*3/uL — ABNORMAL LOW (ref 0.9–3.3)

## 2014-08-11 NOTE — Telephone Encounter (Signed)
gv and printed appt schedand avs for pt for Sept °

## 2014-08-11 NOTE — Progress Notes (Signed)
[facility] [phone]   Fax:(336) [place], [place] [address]  DIAGNOSIS AND STAGE: : Stage IIIB (T2a, N3, M0) nonsmall cell lung cancer, adenocarcinoma, in a never smoker patient with positive EGFR mutation diagnosed in January 2010.   PRIOR THERAPY:  1) Status post concurrent chemoradiation with weekly carboplatin and paclitaxel; last dose given March 21, 2009.  2) Tarceva at 150 mg p.o. daily, status post approximately 48 months of treatment, discontinued secondary to persistent diarrhea.  3) status post right Pleurx catheter placement for right nonmalignant pleural effusion.  CURRENT THERAPY: Tarceva at 100 mg p.o. Daily, started 03/19/2013, status post 14 months of treatment.   CHEMOTHERAPY INTENT: Maintenance.  CURRENT # OF CHEMOTHERAPY CYCLES: 63 CURRENT ANTIEMETICS: Compazine  CURRENT SMOKING STATUS: Never smoker  ORAL CHEMOTHERAPY AND CONSENT: Tarceva  CURRENT BISPHOSPHONATES USE: None  PAIN MANAGEMENT: 5/10 low back pain and sciatica currently on naproxen.  NARCOTICS INDUCED CONSTIPATION: None  LIVING WILL AND CODE STATUS: Full code but no long-term resuscitation.   INTERVAL HISTORY: Dana [name] 79 y.o. female returns to the clinic today for followup visit. She is tolerating her current treatment with Tarceva fairly well with no significant adverse effects. She is not complaining of increasing fatigue and weakness recently she also lost few pounds since her last visit. After her last visit the patient underwent ultrasound-guided right thoracentesis which was complicated with pneumothorax requiring chest tube placement. She was also seen by Dr. Servando Snare and underwent a right Pleurx catheter placement with evacuation of pneumothorax and pleural effusion. She is still draining a significant amount of pleural fluids every other day. The cytology of the right pleural fluid showed  no evidence for malignancy. She continues to have few episodes of diarrhea every 3-4 days improved with Imodium. She has no chest pain, cough or hemoptysis. She denied having any significant skin rash.   MEDICAL HISTORY: Past Medical History  Diagnosis Date  . Hemorrhoid   . COPD (chronic obstructive pulmonary disease)   . GERD (gastroesophageal reflux disease)   . History of lung cancer ONCOLOGIST--  DR [facility]--  LAST CT ,  NO RECURRENCE OR METS    DX JAN 2010 --  STAGE IIIA  NON-SMALL CELL ADENOCARCINOMA (RIGHT MIDDLE LOBE)---  S/P  CHEMORADIATIO THERAPY  (COMPLETE 03-21-2009)  . History of anal fissures   . Arthritis     HANDS,  WRISTS  . Osteoporosis   . Right wrist pain     MASS  . Dyspnea on exertion   . Normal cardiac stress test     2007  PER PT  . Wears glasses   . Rash, skin     RIGHT FOREARM/ HAND  . Cancer     ALLERGIES:  is allergic to amoxicillin.  MEDICATIONS:  Current Outpatient Prescriptions  Medication Sig Dispense Refill  . aspirin EC 81 MG tablet Take 81 mg by mouth daily.      . Calcium Carbonate-Vitamin D 600-400 MG-UNIT per chew tablet Chew 1 tablet by mouth 2 (two) times daily.      . cholecalciferol (VITAMIN D) 1000 UNITS tablet Take 1,000 Units by mouth daily.       . Coenzyme Q10 (COQ10) 100 MG CAPS Take 1 capsule by mouth daily.       Marland Kitchen conjugated estrogens (PREMARIN) vaginal cream Place vaginally once a week.        . Cyanocobalamin (B-12) 1000  MCG CAPS Take 1,000 mcg by mouth 3 (three) times daily with meals.       . erlotinib (TARCEVA) 100 MG tablet Take 1 tablet (100 mg total) by mouth every evening. Take on an empty stomach 1 hour before meals or 2 hours after  30 tablet  1  . Hydrocodone-Acetaminophen (VICODIN) 5-300 MG TABS Take 1 tablet by mouth 4 (four) times daily as needed (pain.).      Marland Kitchen hydroxychloroquine (PLAQUENIL) 200 MG tablet Take 400 mg by mouth every morning.       . loperamide (IMODIUM A-D) 2 MG tablet Take 1 mg by mouth 4  (four) times daily as needed for diarrhea or loose stools.       Marland Kitchen loratadine (CLARITIN) 10 MG tablet Take 10 mg by mouth daily as needed for allergies.       Marland Kitchen LORazepam (ATIVAN) 0.5 MG tablet Take 0.5 mg by mouth every 8 (eight) hours as needed for anxiety.      Marland Kitchen oxyCODONE (OXY IR/ROXICODONE) 5 MG immediate release tablet Take 1 tablet (5 mg total) by mouth every 4 (four) hours as needed for moderate pain.  30 tablet  0  . Polyethyl Glycol-Propyl Glycol (SYSTANE OP) Place 1 drop into both eyes at bedtime as needed (for dry eyes.).       Marland Kitchen Simethicone (GAS-X PO) Take 1 tablet by mouth 2 (two) times daily as needed (gas.).      Marland Kitchen tiotropium (SPIRIVA) 18 MCG inhalation capsule Place 18 mcg into inhaler and inhale every morning.        No current facility-administered medications for this visit.    SURGICAL HISTORY:  Past Surgical History  Procedure Laterality Date  . Anal fissure repair  07/09/2011    INTERNAL SPHINCTEROTOMY  . Knee arthroscopy Right 1999  . Thumb arthroscopy Left     - removed bone spur  . Thoracoscopy  01-26-2009    w/   lung/  node biopsy's  . Benign excision left breast central duct  02/1999  . Vault suspension plus cystocele repair with graft  06-13-2010  . Transthoracic echocardiogram  12-23-2008    NORMAL LV/  EF 65-70%/  MILD MR  &  TR  . Total abdominal hysterectomy w/ bilateral salpingoophorectomy  1982    W/  APPENDECTOMY  . Tonsillectomy  AS CHILD  . Excision right wrist mass  2012  . Mass excision Right 03/11/2014    Procedure: RIGHT WRIST DEEP MASS EXCISION WITH CULTURE AND BIOSPY;  Surgeon: Linna Hoff, MD;  Location: [facility];  Service: Orthopedics;  Laterality: Right;  . Chest tube insertion Right 07/14/2014    Procedure: INSERTION PLEURAL DRAINAGE CATHETER RIGHT CHEST;  Surgeon: Grace Isaac, MD;  Location: [facility];  Service: Thoracic;  Laterality: Right;    REVIEW OF SYSTEMS:  Constitutional: negative Eyes: negative Ears,  nose, mouth, throat, and face: negative Respiratory: positive for dyspnea on exertion Cardiovascular: negative Gastrointestinal: negative Genitourinary:negative Integument/breast: negative Hematologic/lymphatic: negative Musculoskeletal:negative Neurological: negative Behavioral/Psych: negative Endocrine: negative Allergic/Immunologic: negative   PHYSICAL EXAMINATION: General appearance: alert, cooperative and no distress Head: Normocephalic, without obvious abnormality, atraumatic Neck: no adenopathy, no JVD, supple, symmetrical, trachea midline and thyroid not enlarged, symmetric, no tenderness/mass/nodules Lymph nodes: Cervical, supraclavicular, and axillary nodes normal. Resp: diminished breath sounds RLL and dullness to percussion RLL Cardio: regular rate and rhythm, S1, S2 normal, no murmur, click, rub or gallop GI: soft, non-tender; bowel sounds normal; no masses,  no organomegaly  Extremities: extremities normal, atraumatic, no cyanosis or edema Neurologic: Alert and oriented X 3, normal strength and tone. Normal symmetric reflexes. Normal coordination and gait  ECOG PERFORMANCE STATUS: 1 - Symptomatic but completely ambulatory  Blood pressure 95/58, pulse 77, temperature 98.4 F (36.9 C), temperature source Oral, resp. rate 18, height 5' 4" (1.626 m), weight 119 lb 8 oz (54.205 kg), SpO2 98.00%.  LABORATORY DATA: Lab Results  Component Value Date   WBC 6.3 08/11/2014   HGB 12.4 08/11/2014   HCT 36.9 08/11/2014   MCV 90.9 08/11/2014   PLT 301 08/11/2014      Chemistry      Component Value Date/Time   NA 141 08/11/2014 1010   NA 143 07/14/2014 0409   NA 145 11/09/2011 1345   K 4.5 08/11/2014 1010   K 4.1 07/14/2014 0409   K 3.9 11/09/2011 1345   CL 106 07/14/2014 0409   CL 108* 06/03/2013 0939   CL 104 11/09/2011 1345   CO2 28 08/11/2014 1010   CO2 25 07/14/2014 0409   CO2 28 11/09/2011 1345   BUN 20.6 08/11/2014 1010   BUN 12 07/14/2014 0409   BUN 20 11/09/2011 1345    CREATININE 1.0 08/11/2014 1010   CREATININE 0.83 07/14/2014 0409   CREATININE 0.6 11/09/2011 1345      Component Value Date/Time   CALCIUM 9.0 08/11/2014 1010   CALCIUM 8.4 07/14/2014 0409   CALCIUM 8.7 11/09/2011 1345   ALKPHOS 51 08/11/2014 1010   ALKPHOS 60 07/13/2014 2023   ALKPHOS 52 11/09/2011 1345   AST 28 08/11/2014 1010   AST 24 07/13/2014 2023   AST 20 11/09/2011 1345   ALT 23 08/11/2014 1010   ALT 23 07/13/2014 2023   ALT 21 11/09/2011 1345   BILITOT 0.81 08/11/2014 1010   BILITOT 0.9 07/13/2014 2023   BILITOT 0.60 11/09/2011 1345       RADIOGRAPHIC STUDIES:  ASSESSMENT AND PLAN: This is a very pleasant 79 years old white female with history of stage IIIa non-small cell lung cancer status post concurrent chemoradiation and has been on treatment with Tarceva for the last 62 months with no significant evidence for disease progression.  She is tolerating her treatment well. She is currently has right Pleurx catheter placed for drainage of recurrent right pleural effusion that was negative for malignancy on several cytologic evaluation. The etiology is unclear but still suspicious for malignancy. She will followup with Dr. Servando Snare for evaluation of the Pleurx catheter and its drainage. I recommended for her to continue her current treatment with Tarceva 100 mg by mouth daily. She would come back for followup visit in one month's for reevaluation with repeat blood work. She was advised to call immediately if she has any concerning symptoms in the interval.  The patient voices understanding of current disease status and treatment options and is in agreement with the current care plan.  All questions were answered. The patient knows to call the clinic with any problems, questions or concerns. We can certainly see the patient much sooner if necessary.  Disclaimer: This note was dictated with voice recognition software. Similar sounding words can inadvertently be transcribed and may not  be corrected upon review.

## 2014-08-12 ENCOUNTER — Ambulatory Visit (INDEPENDENT_AMBULATORY_CARE_PROVIDER_SITE_OTHER): Payer: Medicare Other | Admitting: Cardiothoracic Surgery

## 2014-08-12 ENCOUNTER — Encounter: Payer: Self-pay | Admitting: Cardiothoracic Surgery

## 2014-08-12 ENCOUNTER — Ambulatory Visit
Admission: RE | Admit: 2014-08-12 | Discharge: 2014-08-12 | Disposition: A | Discharge status: Home / Self Care | Payer: Medicare Other | Source: Ambulatory Visit | Attending: Cardiothoracic Surgery | Admitting: Cardiothoracic Surgery

## 2014-08-12 VITALS — BP 114/74 | HR 90 | Resp 20 | Ht 64.0 in | Wt 119.0 lb

## 2014-08-12 DIAGNOSIS — C3490 Malignant neoplasm of unspecified part of unspecified bronchus or lung: Secondary | ICD-10-CM

## 2014-08-12 DIAGNOSIS — J90 Pleural effusion, not elsewhere classified: Secondary | ICD-10-CM

## 2014-08-12 NOTE — Progress Notes (Signed)
[address]       [place] [address]             [phone]      Trenee H [place]ner [place] Medical Record #[id number] Date of Birth: [birth year]  Referring:Dr Mohamed Primary Care: [name], MD  Chief Complaint:   POST OP FOLLOW UP 07/14/2014  OPERATIVE REPORT  PREOPERATIVE DIAGNOSIS: Right iatrogenic pneumothorax and pleural  effusion, with history of stage IIIB carcinoma of the lung.  PROCEDURE PERFORMED: Placement of right PleurX catheter and evacuation  of pneumothorax and pleural effusion with ultrasound and fluoroscopic  guidance.  SURGEON: Lanelle Bal, MD  History of Present Illness:     Patient returns to the office after placement of a Pleurx catheter for a combination right pneumothorax and recurrent right pleural effusion. She notes that after she was initially treated with a Pleurx she was fatigued and short of breath. This has improved with drainage. She notes she walked a mile today without difficulty. The Pleurx is now being drained every other day because of increased drainage up to 800 mL on August 6 . She is currently draining between 303 150 mL's every other day .    Pleural fluid pathology is noted: Diagnosis PLEURAL FLUID, RIGHT (SPECIMEN 1 OF 1 COLLECTED ON 07/14/2014): NEGATIVE FOR CARCINOMA. LYMPHOCYTOSIS WITH SCATTERED ACUTE INFLAMMATORY CELLS PRESENT. Tissue-Flow Cytometry - PREDOMINANCE OF T LYMPHOCYTES WITH CD4 PHENOTYPE. - NO MONOCLONAL B-CELL POPULATION IDENTIFIED.   Past Medical History  Diagnosis Date  . Hemorrhoid   . COPD (chronic obstructive pulmonary disease)   . GERD (gastroesophageal reflux disease)   . History of lung cancer ONCOLOGIST--  DR [facility]--  LAST CT ,  NO RECURRENCE OR METS    DX JAN 2010 --  STAGE IIIA  NON-SMALL CELL ADENOCARCINOMA (RIGHT MIDDLE LOBE)---  S/P  CHEMORADIATIO THERAPY  (COMPLETE 03-21-2009)  . History of anal fissures   . Arthritis     HANDS,  WRISTS  . Osteoporosis   .  Right wrist pain     MASS  . Dyspnea on exertion   . Normal cardiac stress test     2007  PER PT  . Wears glasses   . Rash, skin     RIGHT FOREARM/ HAND  . Cancer      History  Smoking status  . Former Smoker -- 1.00 packs/day for 30 years  . Types: Cigarettes  . Quit date: 12/24/1977  Smokeless tobacco  . Never Used    History  Alcohol Use No     Allergies  Allergen Reactions  . Amoxicillin Diarrhea    Hx of C Diff     Current Outpatient Prescriptions  Medication Sig Dispense Refill  . aspirin EC 81 MG tablet Take 81 mg by mouth daily.      . Calcium Carbonate-Vitamin D 600-400 MG-UNIT per chew tablet Chew 1 tablet by mouth 2 (two) times daily.      . cholecalciferol (VITAMIN D) 1000 UNITS tablet Take 1,000 Units by mouth daily.       . Coenzyme Q10 (COQ10) 100 MG CAPS Take 1 capsule by mouth daily.       Marland Kitchen conjugated estrogens (PREMARIN) vaginal cream Place vaginally once a week.        . Cyanocobalamin (B-12) 1000 MCG CAPS Take 1,000 mcg by mouth 3 (three) times daily with meals.       . erlotinib (TARCEVA) 100 MG tablet Take 1 tablet (100 mg  total) by mouth every evening. Take on an empty stomach 1 hour before meals or 2 hours after  30 tablet  1  . Hydrocodone-Acetaminophen (VICODIN) 5-300 MG TABS Take 1 tablet by mouth 4 (four) times daily as needed (pain.).      Marland Kitchen hydroxychloroquine (PLAQUENIL) 200 MG tablet Take 400 mg by mouth every morning.       . loperamide (IMODIUM A-D) 2 MG tablet Take 1 mg by mouth 4 (four) times daily as needed for diarrhea or loose stools.       Marland Kitchen loratadine (CLARITIN) 10 MG tablet Take 10 mg by mouth daily as needed for allergies.       Marland Kitchen LORazepam (ATIVAN) 0.5 MG tablet Take 0.5 mg by mouth every 8 (eight) hours as needed for anxiety.      Marland Kitchen oxyCODONE (OXY IR/ROXICODONE) 5 MG immediate release tablet Take 1 tablet (5 mg total) by mouth every 4 (four) hours as needed for moderate pain.  30 tablet  0  . Polyethyl Glycol-Propyl Glycol  (SYSTANE OP) Place 1 drop into both eyes at bedtime as needed (for dry eyes.).       Marland Kitchen Simethicone (GAS-X PO) Take 1 tablet by mouth 2 (two) times daily as needed (gas.).      Marland Kitchen tiotropium (SPIRIVA) 18 MCG inhalation capsule Place 18 mcg into inhaler and inhale every morning.        No current facility-administered medications for this visit.       Physical Exam: BP 114/74  Pulse 90  Resp 20  Ht 5\' 4"  (1.626 m)  Wt 119 lb (53.978 kg)  BMI 20.42 kg/m2  SpO2 95%  General appearance: alert, cooperative and no distress Neurologic: intact Heart: regular rate and rhythm, S1, S2 normal, no murmur, click, rub or gallop Lungs: diminished breath sounds RLL Abdomen: soft, non-tender; bowel sounds normal; no masses,  no organomegaly Extremities: extremities normal, atraumatic, no cyanosis or edema and Homans sign is negative, no sign of DVT Wound: Pleurx catheter is intact   Diagnostic Studies & Laboratory data:     Recent Radiology Findings:   Dg Chest 2 View  08/12/2014   CLINICAL DATA:  History of lung cancer. Right pleural drainage catheter 07/14/2014.  EXAM: CHEST  2 VIEW  COMPARISON:  07/27/2014  FINDINGS: Small caliber right pleural drainage catheter remains in place with tip over the anterior upper right pleural space. There is a small right pleural fluid collection slightly improved. Mild opacification over the posterior right infrahilar region unchanged. Left lung is clear. Cardiomediastinal silhouette and remainder of the exam is unchanged.  IMPRESSION: Small caliber right-sided pleural drainage catheter remains in place with tip over the anterior upper right pleural space. Improved small right pleural fluid collection.  Mild opacification over the posterior right infrahilar region slightly improved.   Electronically Signed   By: Marin Olp M.D.   On: 08/12/2014 11:11      Recent Lab Findings: Lab Results  Component Value Date   WBC 6.3 08/11/2014   HGB 12.4 08/11/2014   HCT  36.9 08/11/2014   PLT 301 08/11/2014   GLUCOSE 95 08/11/2014   CHOL  Value: 225        ATP III CLASSIFICATION:  <200     mg/dL   Desirable  200-239  mg/dL   Borderline High  >=240    mg/dL   High* 12/23/2008   TRIG 94 12/23/2008   HDL 64 12/23/2008   LDLCALC  Value: 142  Total Cholesterol/HDL:CHD Risk Coronary Heart Disease Risk Table                     Men   Women  1/2 Average Risk   3.4   3.3* 12/23/2008   ALT 23 08/11/2014   AST 28 08/11/2014   NA 141 08/11/2014   K 4.5 08/11/2014   CL 106 07/14/2014   CREATININE 1.0 08/11/2014   BUN 20.6 08/11/2014   CO2 28 08/11/2014   TSH 2.661 11/10/2012   INR 1.09 07/13/2014   HGBA1C  Value: 5.8 (NOTE)   The ADA recommends the following therapeutic goal for glycemic   control related to Hgb A1C measurement:   Goal of Therapy:   < 7.0% Hgb A1C   Reference: American Diabetes Association: Clinical Practice   Recommendations 2008, Diabetes Care,  2008, 31:(Suppl 1). 12/23/2008      Assessment / Plan:     Patient doing well following placement of right Pleurx catheter for combined hydropneumothorax after attempted thoracentesis. The patient's functional status has improved. Stable appearance of her chest x-ray We will leave the Pleurx and continue to drain, changing the schedule to Monday Wednesday Friday I plan to see her back in one month with a followup chest x-ray     Grace Isaac MD      [address] [place] [address] Office [phone]   Beeper [phone]  08/12/2014 11:36 AM

## 2014-08-26 ENCOUNTER — Other Ambulatory Visit: Payer: Self-pay | Admitting: Family Medicine

## 2014-08-26 DIAGNOSIS — M8580 Other specified disorders of bone density and structure, unspecified site: Secondary | ICD-10-CM

## 2014-08-31 ENCOUNTER — Other Ambulatory Visit: Payer: Self-pay | Admitting: *Deleted

## 2014-08-31 DIAGNOSIS — C3490 Malignant neoplasm of unspecified part of unspecified bronchus or lung: Secondary | ICD-10-CM

## 2014-08-31 MED ORDER — ERLOTINIB HCL 100 MG PO TABS: 100.0000 mg | ORAL_TABLET | Freq: Every evening | ORAL | Status: DC

## 2014-09-10 ENCOUNTER — Other Ambulatory Visit: Payer: Self-pay | Admitting: Cardiothoracic Surgery

## 2014-09-10 DIAGNOSIS — C3490 Malignant neoplasm of unspecified part of unspecified bronchus or lung: Secondary | ICD-10-CM

## 2014-09-13 ENCOUNTER — Other Ambulatory Visit: Payer: Medicare Other

## 2014-09-13 ENCOUNTER — Other Ambulatory Visit: Payer: Self-pay

## 2014-09-13 ENCOUNTER — Ambulatory Visit (INDEPENDENT_AMBULATORY_CARE_PROVIDER_SITE_OTHER): Payer: Medicare Other | Admitting: Physician Assistant

## 2014-09-13 ENCOUNTER — Ambulatory Visit
Admission: RE | Admit: 2014-09-13 | Discharge: 2014-09-13 | Disposition: A | Discharge status: Home / Self Care | Payer: Medicare Other | Source: Ambulatory Visit | Attending: Cardiothoracic Surgery | Admitting: Cardiothoracic Surgery

## 2014-09-13 VITALS — BP 107/70 | HR 86 | Resp 16 | Ht 64.0 in | Wt 117.0 lb

## 2014-09-13 DIAGNOSIS — C3490 Malignant neoplasm of unspecified part of unspecified bronchus or lung: Secondary | ICD-10-CM

## 2014-09-13 DIAGNOSIS — J90 Pleural effusion, not elsewhere classified: Secondary | ICD-10-CM

## 2014-09-13 MED ORDER — TALC 5 G PL SUSR: 3.0000 g | Freq: Once | INTRAPLEURAL | Status: DC

## 2014-09-13 NOTE — Progress Notes (Signed)
Chief Complaints: Post Op Follow Up 07/14/2014  OPERATIVE REPORT  PREOPERATIVE DIAGNOSIS: Right iatrogenic pneumothorax and pleural  effusion, with history of stage IIIB carcinoma of the lung.  PROCEDURE PERFORMED: Placement of right PleurX catheter and evacuation  of pneumothorax and pleural effusion with ultrasound and fluoroscopic  guidance.  SURGEON: Lanelle Bal, MD  History of Presenting Illness: Patient has stage IIIB right lung cancer (s/p chemo and radiation 10') and returns for further evaluation s/p right Pleur X catheter placement. The right pleural fluid was NEGATIVE for carcinoma. The Pleur X is being drained every Monday, Wednesday, and Friday. Drainage has been between 600-725 cc. Patient denies shortness of breath.   Current Outpatient Prescriptions  Medication Sig Dispense Refill  . aspirin EC 81 MG tablet Take 81 mg by mouth daily.      . Calcium Carbonate-Vitamin D 600-400 MG-UNIT per chew tablet Chew 1 tablet by mouth 2 (two) times daily.      . cholecalciferol (VITAMIN D) 1000 UNITS tablet Take 1,000 Units by mouth daily.       . Coenzyme Q10 (COQ10) 100 MG CAPS Take 1 capsule by mouth daily.       Marland Kitchen conjugated estrogens (PREMARIN) vaginal cream Place vaginally once a week.        . Cyanocobalamin (B-12) 1000 MCG CAPS Take 1,000 mcg by mouth 3 (three) times daily with meals.       . erlotinib (TARCEVA) 100 MG tablet Take 1 tablet (100 mg total) by mouth every evening. Take on an empty stomach 1 hour before meals or 2 hours after  30 tablet  2  . Hydrocodone-Acetaminophen (VICODIN) 5-300 MG TABS Take 1 tablet by mouth 4 (four) times daily as needed (pain.).      Marland Kitchen hydroxychloroquine (PLAQUENIL) 200 MG tablet Take 400 mg by mouth every morning.       . loperamide (IMODIUM A-D) 2 MG tablet Take 1 mg by mouth 4 (four) times daily as needed for diarrhea or loose stools.       Marland Kitchen loratadine (CLARITIN) 10 MG tablet Take 10 mg by mouth daily as needed for allergies.        Marland Kitchen LORazepam (ATIVAN) 0.5 MG tablet Take 0.5 mg by mouth every 8 (eight) hours as needed for anxiety.      Marland Kitchen oxyCODONE (OXY IR/ROXICODONE) 5 MG immediate release tablet Take 1 tablet (5 mg total) by mouth every 4 (four) hours as needed for moderate pain.  30 tablet  0  . Polyethyl Glycol-Propyl Glycol (SYSTANE OP) Place 1 drop into both eyes at bedtime as needed (for dry eyes.).       Marland Kitchen Simethicone (GAS-X PO) Take 1 tablet by mouth 2 (two) times daily as needed (gas.).      Marland Kitchen tiotropium (SPIRIVA) 18 MCG inhalation capsule Place 18 mcg into inhaler and inhale every morning.        No current facility-administered medications for this visit.    Physical Exam: CV Pulmonary Extremities Wound  Diagnostic Tests: CLINICAL DATA: Lung cancer. Drainage tube and right chest.  Persistent shortness of breath.  EXAM:  CHEST 2 VIEW  COMPARISON: 08/12/2014  FINDINGS:  There is hyperinflation of the lungs compatible with COPD. Right  PleurX catheter in place. No pneumothorax. Small right pleural  effusion, slightly improved since prior study. No left effusion.  Linear areas of scarring in the right lung. Left lung is clear.  Heart is normal size.  IMPRESSION:  Small right pleural effusion, slightly decreased.  No pneumothorax.  COPD.  Electronically Signed  By: Rolm Baptise M.D.  On: 09/13/2014 12:29   Impression and Plan: Her drainage record shows 600-725 cc from the right Pleur X. It is being drained every Monday, Wednesday, and Friday. Per Dr. Servando Snare, we will continue these drainage days. The patient will come to short stay at [facility] this week to have Talc slurry placed. Patient was informed that Talc may clog the Pleur X catheter. Hopefully, Talc will slow down drainage. She will then return to see Dr. Servando Snare in one month with a chest x ray. She is to call sooner, if right Pleur X drainage decreases. Also, she has a follow up with Dr. Mckinley Jewel on Wednesday.

## 2014-09-13 NOTE — Patient Instructions (Signed)
Continue right Pleur X drainage every Monday, Wednesday, and Friday. Record output.

## 2014-09-15 ENCOUNTER — Other Ambulatory Visit (HOSPITAL_BASED_OUTPATIENT_CLINIC_OR_DEPARTMENT_OTHER): Payer: Medicare Other

## 2014-09-15 ENCOUNTER — Encounter: Payer: Self-pay | Admitting: Internal Medicine

## 2014-09-15 ENCOUNTER — Encounter (HOSPITAL_COMMUNITY): Payer: Self-pay | Admitting: Pharmacy Technician

## 2014-09-15 ENCOUNTER — Ambulatory Visit (HOSPITAL_BASED_OUTPATIENT_CLINIC_OR_DEPARTMENT_OTHER): Payer: Medicare Other | Admitting: Internal Medicine

## 2014-09-15 ENCOUNTER — Telehealth: Payer: Self-pay | Admitting: Internal Medicine

## 2014-09-15 VITALS — BP 122/66 | HR 89 | Temp 97.6°F | Resp 20 | Ht 64.0 in | Wt 122.1 lb

## 2014-09-15 DIAGNOSIS — C3490 Malignant neoplasm of unspecified part of unspecified bronchus or lung: Secondary | ICD-10-CM

## 2014-09-15 DIAGNOSIS — J90 Pleural effusion, not elsewhere classified: Secondary | ICD-10-CM

## 2014-09-15 DIAGNOSIS — C342 Malignant neoplasm of middle lobe, bronchus or lung: Secondary | ICD-10-CM

## 2014-09-15 LAB — CBC WITH DIFFERENTIAL/PLATELET
BASO%: 0.4 % (ref 0.0–2.0)
BASOS ABS: 0 10*3/uL (ref 0.0–0.1)
EOS%: 4.1 % (ref 0.0–7.0)
Eosinophils Absolute: 0.2 10*3/uL (ref 0.0–0.5)
HCT: 36.6 % (ref 34.8–46.6)
HEMOGLOBIN: 12 g/dL (ref 11.6–15.9)
LYMPH%: 4.4 % — AB (ref 14.0–49.7)
MCH: 31.1 pg (ref 25.1–34.0)
MCHC: 32.7 g/dL (ref 31.5–36.0)
MCV: 95 fL (ref 79.5–101.0)
MONO#: 0.5 10*3/uL (ref 0.1–0.9)
MONO%: 7.7 % (ref 0.0–14.0)
NEUT#: 5.1 10*3/uL (ref 1.5–6.5)
NEUT%: 83.4 % — AB (ref 38.4–76.8)
PLATELETS: 276 10*3/uL (ref 145–400)
RBC: 3.85 10*6/uL (ref 3.70–5.45)
RDW: 14.8 % — ABNORMAL HIGH (ref 11.2–14.5)
WBC: 6.1 10*3/uL (ref 3.9–10.3)
lymph#: 0.3 10*3/uL — ABNORMAL LOW (ref 0.9–3.3)

## 2014-09-15 LAB — COMPREHENSIVE METABOLIC PANEL (CC13)
ALBUMIN: 2.3 g/dL — AB (ref 3.5–5.0)
ALK PHOS: 44 U/L (ref 40–150)
ALT: 24 U/L (ref 0–55)
AST: 29 U/L (ref 5–34)
Anion Gap: 6 mEq/L (ref 3–11)
BILIRUBIN TOTAL: 0.53 mg/dL (ref 0.20–1.20)
BUN: 19.5 mg/dL (ref 7.0–26.0)
CO2: 26 mEq/L (ref 22–29)
Calcium: 8.4 mg/dL (ref 8.4–10.4)
Chloride: 110 mEq/L — ABNORMAL HIGH (ref 98–109)
Creatinine: 0.8 mg/dL (ref 0.6–1.1)
GLUCOSE: 90 mg/dL (ref 70–140)
Potassium: 3.6 mEq/L (ref 3.5–5.1)
Sodium: 142 mEq/L (ref 136–145)
Total Protein: 4.9 g/dL — ABNORMAL LOW (ref 6.4–8.3)

## 2014-09-15 NOTE — Progress Notes (Signed)
[place] [phone]   Fax:([phone]) [place], [place] [address]  DIAGNOSIS AND STAGE: : Stage IIIB (T2a, N3, M0) nonsmall cell lung cancer, adenocarcinoma, in a never smoker patient with positive EGFR mutation diagnosed in January 2010.   PRIOR THERAPY:  1) Status post concurrent chemoradiation with weekly carboplatin and paclitaxel; last dose given March 21, 2009.  2) Tarceva at 150 mg p.o. daily, status post approximately 48 months of treatment, discontinued secondary to persistent diarrhea.  3) status post right Pleurx catheter placement for right nonmalignant pleural effusion.  CURRENT THERAPY: Tarceva at 100 mg p.o. Daily, started 03/19/2013, status post 15 months of treatment.   CHEMOTHERAPY INTENT: Maintenance.  CURRENT # OF CHEMOTHERAPY CYCLES: 64 CURRENT ANTIEMETICS: Compazine  CURRENT SMOKING STATUS: Never smoker  ORAL CHEMOTHERAPY AND CONSENT: Tarceva  CURRENT BISPHOSPHONATES USE: None  PAIN MANAGEMENT: 5/10 low back pain and sciatica currently on naproxen.  NARCOTICS INDUCED CONSTIPATION: None  LIVING WILL AND CODE STATUS: Full code but no long-term resuscitation.   INTERVAL HISTORY: Dana [name] 79 y.o. female returns to the clinic today for followup visit. She is tolerating her current treatment with Tarceva fairly well with no significant adverse effects. She continues to gain more than 500 cc of pleural fluid every other day via the right Pleurx catheter. She is expected to have talc pleurodesis tomorrow under the care of Dr. Servando Snare. The cytology of the right pleural fluid showed no evidence for malignancy. She denied having any significant skin rash or diarrhea. She has no chest pain, cough or hemoptysis. Her weight has been stable. He has no nausea or vomiting.  MEDICAL HISTORY: Past Medical History  Diagnosis Date  . Hemorrhoid   . COPD (chronic obstructive  pulmonary disease)   . GERD (gastroesophageal reflux disease)   . History of lung cancer ONCOLOGIST--  DR [facility]--  LAST CT ,  NO RECURRENCE OR METS    DX JAN 2010 --  STAGE IIIA  NON-SMALL CELL ADENOCARCINOMA (RIGHT MIDDLE LOBE)---  S/P  CHEMORADIATIO THERAPY  (COMPLETE 03-21-2009)  . History of anal fissures   . Arthritis     HANDS,  WRISTS  . Osteoporosis   . Right wrist pain     MASS  . Dyspnea on exertion   . Normal cardiac stress test     2007  PER PT  . Wears glasses   . Rash, skin     RIGHT FOREARM/ HAND  . Cancer     ALLERGIES:  is allergic to amoxicillin.  MEDICATIONS:  Current Outpatient Prescriptions  Medication Sig Dispense Refill  . aspirin EC 81 MG tablet Take 81 mg by mouth daily.      . Calcium Carbonate-Vitamin D 600-400 MG-UNIT per chew tablet Chew 1 tablet by mouth 2 (two) times daily.      . cholecalciferol (VITAMIN D) 1000 UNITS tablet Take 1,000 Units by mouth daily.       . Coenzyme Q10 (COQ10) 100 MG CAPS Take 1 capsule by mouth daily.       Marland Kitchen conjugated estrogens (PREMARIN) vaginal cream Place vaginally once a week.        . Cyanocobalamin (B-12) 1000 MCG CAPS Take 1,000 mcg by mouth 3 (three) times daily with meals.       . erlotinib (TARCEVA) 100 MG tablet Take 1 tablet (100 mg total) by mouth every evening. Take on an empty stomach 1  hour before meals or 2 hours after  30 tablet  2  . Hydrocodone-Acetaminophen (VICODIN) 5-300 MG TABS Take 1 tablet by mouth 4 (four) times daily as needed (pain.).      Marland Kitchen hydroxychloroquine (PLAQUENIL) 200 MG tablet Take 400 mg by mouth every morning.       . loperamide (IMODIUM A-D) 2 MG tablet Take 1 mg by mouth 4 (four) times daily as needed for diarrhea or loose stools.       Marland Kitchen loratadine (CLARITIN) 10 MG tablet Take 10 mg by mouth daily as needed for allergies.       Marland Kitchen LORazepam (ATIVAN) 0.5 MG tablet Take 0.5 mg by mouth every 8 (eight) hours as needed for anxiety.      Marland Kitchen oxyCODONE (OXY IR/ROXICODONE) 5 MG  immediate release tablet Take 1 tablet (5 mg total) by mouth every 4 (four) hours as needed for moderate pain.  30 tablet  0  . Polyethyl Glycol-Propyl Glycol (SYSTANE OP) Place 1 drop into both eyes at bedtime as needed (for dry eyes.).       Marland Kitchen Simethicone (GAS-X PO) Take 1 tablet by mouth 2 (two) times daily as needed (gas.).      Marland Kitchen tiotropium (SPIRIVA) 18 MCG inhalation capsule Place 18 mcg into inhaler and inhale every morning.        No current facility-administered medications for this visit.    SURGICAL HISTORY:  Past Surgical History  Procedure Laterality Date  . Anal fissure repair  07/09/2011    INTERNAL SPHINCTEROTOMY  . Knee arthroscopy Right 1999  . Thumb arthroscopy Left     - removed bone spur  . Thoracoscopy  01-26-2009    w/   lung/  node biopsy's  . Benign excision left breast central duct  02/1999  . Vault suspension plus cystocele repair with graft  06-13-2010  . Transthoracic echocardiogram  12-23-2008    NORMAL LV/  EF 65-70%/  MILD MR  &  TR  . Total abdominal hysterectomy w/ bilateral salpingoophorectomy  1982    W/  APPENDECTOMY  . Tonsillectomy  AS CHILD  . Excision right wrist mass  2012  . Mass excision Right 03/11/2014    Procedure: RIGHT WRIST DEEP MASS EXCISION WITH CULTURE AND BIOSPY;  Surgeon: Linna Hoff, MD;  Location: [facility];  Service: Orthopedics;  Laterality: Right;  . Chest tube insertion Right 07/14/2014    Procedure: INSERTION PLEURAL DRAINAGE CATHETER RIGHT CHEST;  Surgeon: Grace Isaac, MD;  Location: [facility];  Service: Thoracic;  Laterality: Right;    REVIEW OF SYSTEMS:  Constitutional: negative Eyes: negative Ears, nose, mouth, throat, and face: negative Respiratory: positive for dyspnea on exertion Cardiovascular: negative Gastrointestinal: negative Genitourinary:negative Integument/breast: negative Hematologic/lymphatic: negative Musculoskeletal:negative Neurological: negative Behavioral/Psych:  negative Endocrine: negative Allergic/Immunologic: negative   PHYSICAL EXAMINATION: General appearance: alert, cooperative and no distress Head: Normocephalic, without obvious abnormality, atraumatic Neck: no adenopathy, no JVD, supple, symmetrical, trachea midline and thyroid not enlarged, symmetric, no tenderness/mass/nodules Lymph nodes: Cervical, supraclavicular, and axillary nodes normal. Resp: diminished breath sounds RLL and dullness to percussion RLL Cardio: regular rate and rhythm, S1, S2 normal, no murmur, click, rub or gallop GI: soft, non-tender; bowel sounds normal; no masses,  no organomegaly Extremities: extremities normal, atraumatic, no cyanosis or edema Neurologic: Alert and oriented X 3, normal strength and tone. Normal symmetric reflexes. Normal coordination and gait  ECOG PERFORMANCE STATUS: 1 - Symptomatic but completely ambulatory  Blood pressure 122/66, pulse 89,  temperature 97.6 F (36.4 C), temperature source Oral, resp. rate 20, height $RemoveBe'5\' 4"'[id number]$  (1.626 m), weight 122 lb 1.6 oz (55.384 kg).  LABORATORY DATA: Lab Results  Component Value Date   WBC 6.1 09/15/2014   HGB 12.0 09/15/2014   HCT 36.6 09/15/2014   MCV 95.0 09/15/2014   PLT 276 09/15/2014      Chemistry      Component Value Date/Time   NA 142 09/15/2014 1007   NA 143 07/14/2014 0409   NA 145 11/09/2011 1345   K 3.6 09/15/2014 1007   K 4.1 07/14/2014 0409   K 3.9 11/09/2011 1345   CL 106 07/14/2014 0409   CL 108* 06/03/2013 0939   CL 104 11/09/2011 1345   CO2 26 09/15/2014 1007   CO2 25 07/14/2014 0409   CO2 28 11/09/2011 1345   BUN 19.5 09/15/2014 1007   BUN 12 07/14/2014 0409   BUN 20 11/09/2011 1345   CREATININE 0.8 09/15/2014 1007   CREATININE 0.83 07/14/2014 0409   CREATININE 0.6 11/09/2011 1345      Component Value Date/Time   CALCIUM 8.4 09/15/2014 1007   CALCIUM 8.4 07/14/2014 0409   CALCIUM 8.7 11/09/2011 1345   ALKPHOS 44 09/15/2014 1007   ALKPHOS 60 07/13/2014 2023   ALKPHOS 52 11/09/2011  1345   AST 29 09/15/2014 1007   AST 24 07/13/2014 2023   AST 20 11/09/2011 1345   ALT 24 09/15/2014 1007   ALT 23 07/13/2014 2023   ALT 21 11/09/2011 1345   BILITOT 0.53 09/15/2014 1007   BILITOT 0.9 07/13/2014 2023   BILITOT 0.60 11/09/2011 1345       RADIOGRAPHIC STUDIES:  ASSESSMENT AND PLAN: This is a very pleasant 79 years old white female with history of stage IIIa non-small cell lung cancer status post concurrent chemoradiation and has been on treatment with Tarceva for the last 63 months with no significant evidence for disease progression.  She is tolerating her treatment well. She continues to have significant drainage of right pleural fluid. The patient is expected to have talc produces tomorrow by Dr. Servando Snare. I recommended for her to continue her current treatment with Tarceva 100 mg by mouth daily. She would come back for followup visit in one month for reevaluation with repeat blood work and CT scan of the chest for restaging of her disease. She was advised to call immediately if she has any concerning symptoms in the interval.  The patient voices understanding of current disease status and treatment options and is in agreement with the current care plan.  All questions were answered. The patient knows to call the clinic with any problems, questions or concerns. We can certainly see the patient much sooner if necessary.  Disclaimer: This note was dictated with voice recognition software. Similar sounding words can inadvertently be transcribed and may not be corrected upon review.

## 2014-09-15 NOTE — Telephone Encounter (Signed)
pt given appt schedule for oct but then 10/22 lab moved to 10/20 and f/u remains 10/22. central will call w/ct. s/w pt husband re change. confirmed w/husband lab for 10/20 and f/u for 10/22. new schedule mailed. per husband pt will also check mychart.

## 2014-09-16 ENCOUNTER — Ambulatory Visit: Payer: Medicare Other | Admitting: Cardiothoracic Surgery

## 2014-09-16 ENCOUNTER — Encounter (HOSPITAL_COMMUNITY)
Admission: RE | Disposition: A | Discharge status: Home / Self Care | Payer: Self-pay | Source: Ambulatory Visit | Attending: Cardiothoracic Surgery

## 2014-09-16 ENCOUNTER — Ambulatory Visit (HOSPITAL_COMMUNITY)
Admission: RE | Admit: 2014-09-16 | Discharge: 2014-09-16 | Disposition: A | Discharge status: Home / Self Care | Payer: Medicare Other | Source: Ambulatory Visit | Attending: Cardiothoracic Surgery | Admitting: Cardiothoracic Surgery

## 2014-09-16 ENCOUNTER — Other Ambulatory Visit: Payer: Self-pay

## 2014-09-16 DIAGNOSIS — J90 Pleural effusion, not elsewhere classified: Secondary | ICD-10-CM | POA: Diagnosis not present

## 2014-09-16 DIAGNOSIS — J9311 Primary spontaneous pneumothorax: Secondary | ICD-10-CM

## 2014-09-16 HISTORY — PX: TALC PLEURODESIS: SHX2506

## 2014-09-16 SURGERY — PLEURODESIS, USING TALC
Anesthesia: Topical | Site: Chest | Laterality: Right

## 2014-09-16 MED ORDER — TALC 5 G PL SUSR
3.0000 g | Freq: Once | INTRAVENOUS | Status: DC
  Filled 2014-09-16 (×2): qty 3

## 2014-09-16 MED ORDER — TALC 5 G PL SUSR: 5.0000 g | Freq: Once | INTRAPLEURAL | Status: DC

## 2014-09-16 NOTE — Progress Notes (Signed)
      [address]       [place] [address]             [phone]     Brief procedure note:   3 gm talc/saline slurry infused in right pleurx catheter  10 cc of saline to follow flush  Patient tolerated well    GOLD,WAYNE E, PA-C

## 2014-09-16 NOTE — Discharge Instructions (Signed)
Discharge instructions given to patient by Jadene Pierini, [place].

## 2014-09-17 ENCOUNTER — Encounter (HOSPITAL_COMMUNITY): Payer: Self-pay | Admitting: Cardiothoracic Surgery

## 2014-10-06 ENCOUNTER — Telehealth: Payer: Self-pay | Admitting: *Deleted

## 2014-10-06 NOTE — Telephone Encounter (Signed)
Mrs. Nack called to report that her M-W-F pleurx drainage has only been 50cc for the last 3 weeks.  I told her to decrease the drainage to twice a week until of next appointment on 10/14/14. She agreed.

## 2014-10-08 ENCOUNTER — Other Ambulatory Visit: Payer: Self-pay

## 2014-10-11 ENCOUNTER — Ambulatory Visit (INDEPENDENT_AMBULATORY_CARE_PROVIDER_SITE_OTHER): Payer: Medicare Other | Admitting: Physician Assistant

## 2014-10-11 VITALS — BP 120/76 | HR 100 | Resp 20 | Ht 64.0 in | Wt 122.0 lb

## 2014-10-11 DIAGNOSIS — J90 Pleural effusion, not elsewhere classified: Secondary | ICD-10-CM

## 2014-10-11 DIAGNOSIS — J948 Other specified pleural conditions: Secondary | ICD-10-CM

## 2014-10-11 NOTE — Progress Notes (Signed)
HPI:  Patient presents today for drainage from her Pleur-x Catheter.  She has been followed by Dr. Servando Snare since placement in July.  The patient's output had decreased significantly since her Talc pleurodesis.  She was draining the catheter every MWF with only 50 cc out output draining.  She contacted the office on Friday at which time she was instructed to drain every 2 days and report for follow up with Dr. Servando Snare.  Unfortunately, the patient's husband was unable to get the end of the Pleur-x catheter off to drain on Sunday.  Therefore, he took pliers and pulled on the tip, which resulted in a broken end of the catheter.  He said he placed 2 ends together and got 5cc of output.  Ever since then there has been drainage from end of catheter.  I explained to the patient and her husband that because there was nothing closing the end of the catheter there would be drainage present.  I also explained that unfortunately this catheter will no longer be able to drained.  Current Outpatient Prescriptions  Medication Sig Dispense Refill  . aspirin EC 81 MG tablet Take 81 mg by mouth daily.      . Calcium Carbonate-Vitamin D 600-400 MG-UNIT per chew tablet Chew 1 tablet by mouth 2 (two) times daily.      . cholecalciferol (VITAMIN D) 1000 UNITS tablet Take 1,000 Units by mouth daily.       . Coenzyme Q10 (COQ10) 100 MG CAPS Take 1 capsule by mouth daily.       Marland Kitchen conjugated estrogens (PREMARIN) vaginal cream Place 1 Applicatorful vaginally every Sunday.      . Cyanocobalamin (B-12) 1000 MCG CAPS Take 1,000 mcg by mouth 3 (three) times daily with meals.       . erlotinib (TARCEVA) 100 MG tablet Take 1 tablet (100 mg total) by mouth every evening. Take on an empty stomach 1 hour before meals or 2 hours after  30 tablet  2  . Hydrocodone-Acetaminophen (VICODIN) 5-300 MG TABS Take 1 tablet by mouth 4 (four) times daily as needed (for pain).       . hydroxychloroquine (PLAQUENIL) 200 MG tablet Take 400 mg by  mouth every morning.       . loperamide (IMODIUM A-D) 2 MG tablet Take 1 mg by mouth 4 (four) times daily as needed for diarrhea or loose stools.       Marland Kitchen loratadine (CLARITIN) 10 MG tablet Take 10 mg by mouth daily as needed for allergies.       Marland Kitchen LORazepam (ATIVAN) 0.5 MG tablet Take 0.5 mg by mouth every 8 (eight) hours as needed for anxiety.      Marland Kitchen oxyCODONE (OXY IR/ROXICODONE) 5 MG immediate release tablet Take 1 tablet (5 mg total) by mouth every 4 (four) hours as needed for moderate pain.  30 tablet  0  . Polyethyl Glycol-Propyl Glycol (SYSTANE OP) Place 1 drop into both eyes at bedtime as needed (for dry eyes.).       Marland Kitchen Simethicone (GAS-X PO) Take 1 tablet by mouth 2 (two) times daily as needed (for gas).       Marland Kitchen tiotropium (SPIRIVA) 18 MCG inhalation capsule Place 18 mcg into inhaler and inhale every morning.        No current facility-administered medications for this visit.    Physical Exam:  BP 120/76  Pulse 100  Resp 20  Ht 5\' 4"  (1.626 m)  Wt 122 lb (55.339 kg)  BMI  20.93 kg/m2  SpO2 97%  Gen: no apparent distress Heart:  RRR Lungs: diminished breath sounds right base  A/P:  1. Recurrent Malignant pleural effusion- drainage has significantly decreased.  However, her Pleur-x catheter is no longer functioning, due to husband breaking the end of the catheter.  I explained to the them we can assess the amount of pleural fluid base on her CT scan scheduled for tomorrow.  However, I explained that should there be a large amount of pleural fluid present that she may unfortunately need a new Pleur-x catheter placed.  Should the fluid level be low and the patient remain asymptomatic we may be able to remove the pleur-x in place.   2. RTC with Dr. Servando Snare on 10/14/2014

## 2014-10-12 ENCOUNTER — Ambulatory Visit (HOSPITAL_COMMUNITY)
Admission: RE | Admit: 2014-10-12 | Discharge: 2014-10-12 | Disposition: A | Discharge status: Home / Self Care | Payer: Medicare Other | Source: Ambulatory Visit | Attending: Internal Medicine | Admitting: Internal Medicine

## 2014-10-12 ENCOUNTER — Encounter (HOSPITAL_COMMUNITY): Payer: Self-pay

## 2014-10-12 ENCOUNTER — Other Ambulatory Visit: Payer: Self-pay | Admitting: Cardiothoracic Surgery

## 2014-10-12 ENCOUNTER — Other Ambulatory Visit (HOSPITAL_BASED_OUTPATIENT_CLINIC_OR_DEPARTMENT_OTHER): Payer: Medicare Other

## 2014-10-12 DIAGNOSIS — C3490 Malignant neoplasm of unspecified part of unspecified bronchus or lung: Secondary | ICD-10-CM | POA: Diagnosis present

## 2014-10-12 DIAGNOSIS — Z9221 Personal history of antineoplastic chemotherapy: Secondary | ICD-10-CM | POA: Insufficient documentation

## 2014-10-12 DIAGNOSIS — J90 Pleural effusion, not elsewhere classified: Secondary | ICD-10-CM | POA: Diagnosis not present

## 2014-10-12 DIAGNOSIS — J948 Other specified pleural conditions: Secondary | ICD-10-CM | POA: Insufficient documentation

## 2014-10-12 DIAGNOSIS — C342 Malignant neoplasm of middle lobe, bronchus or lung: Secondary | ICD-10-CM

## 2014-10-12 DIAGNOSIS — Z923 Personal history of irradiation: Secondary | ICD-10-CM | POA: Diagnosis not present

## 2014-10-12 LAB — CBC WITH DIFFERENTIAL/PLATELET
BASO%: 0.2 % (ref 0.0–2.0)
Basophils Absolute: 0 10*3/uL (ref 0.0–0.1)
EOS ABS: 0.3 10*3/uL (ref 0.0–0.5)
EOS%: 4.2 % (ref 0.0–7.0)
HEMATOCRIT: 34.6 % — AB (ref 34.8–46.6)
HGB: 11.4 g/dL — ABNORMAL LOW (ref 11.6–15.9)
LYMPH%: 3.7 % — ABNORMAL LOW (ref 14.0–49.7)
MCH: 30.3 pg (ref 25.1–34.0)
MCHC: 32.9 g/dL (ref 31.5–36.0)
MCV: 92 fL (ref 79.5–101.0)
MONO#: 0.7 10*3/uL (ref 0.1–0.9)
MONO%: 10.1 % (ref 0.0–14.0)
NEUT%: 81.8 % — ABNORMAL HIGH (ref 38.4–76.8)
NEUTROS ABS: 5.3 10*3/uL (ref 1.5–6.5)
Platelets: 380 10*3/uL (ref 145–400)
RBC: 3.76 10*6/uL (ref 3.70–5.45)
RDW: 13.8 % (ref 11.2–14.5)
WBC: 6.5 10*3/uL (ref 3.9–10.3)
lymph#: 0.2 10*3/uL — ABNORMAL LOW (ref 0.9–3.3)

## 2014-10-12 LAB — COMPREHENSIVE METABOLIC PANEL (CC13)
ALT: 29 U/L (ref 0–55)
ANION GAP: 10 meq/L (ref 3–11)
AST: 23 U/L (ref 5–34)
Albumin: 2.5 g/dL — ABNORMAL LOW (ref 3.5–5.0)
Alkaline Phosphatase: 65 U/L (ref 40–150)
BILIRUBIN TOTAL: 0.6 mg/dL (ref 0.20–1.20)
BUN: 17.9 mg/dL (ref 7.0–26.0)
CALCIUM: 9.5 mg/dL (ref 8.4–10.4)
CO2: 26 meq/L (ref 22–29)
Chloride: 108 mEq/L (ref 98–109)
Creatinine: 0.8 mg/dL (ref 0.6–1.1)
GLUCOSE: 91 mg/dL (ref 70–140)
Potassium: 4.2 mEq/L (ref 3.5–5.1)
SODIUM: 144 meq/L (ref 136–145)
TOTAL PROTEIN: 5.8 g/dL — AB (ref 6.4–8.3)

## 2014-10-12 MED ORDER — IOHEXOL 300 MG/ML  SOLN
80.0000 mL | Freq: Once | INTRAMUSCULAR | Status: AC | PRN
  Administered 2014-10-12: 80 mL via INTRAVENOUS

## 2014-10-14 ENCOUNTER — Ambulatory Visit
Admission: RE | Admit: 2014-10-14 | Discharge: 2014-10-14 | Disposition: A | Discharge status: Home / Self Care | Payer: Medicare Other | Source: Ambulatory Visit | Attending: Cardiothoracic Surgery | Admitting: Cardiothoracic Surgery

## 2014-10-14 ENCOUNTER — Other Ambulatory Visit: Payer: Self-pay

## 2014-10-14 ENCOUNTER — Ambulatory Visit (HOSPITAL_COMMUNITY)
Admission: AD | Admit: 2014-10-14 | Discharge: 2014-10-14 | Disposition: A | Discharge status: Home / Self Care | Payer: Medicare Other | Source: Ambulatory Visit | Attending: Cardiothoracic Surgery | Admitting: Cardiothoracic Surgery

## 2014-10-14 ENCOUNTER — Ambulatory Visit (HOSPITAL_BASED_OUTPATIENT_CLINIC_OR_DEPARTMENT_OTHER): Payer: Medicare Other | Admitting: Internal Medicine

## 2014-10-14 ENCOUNTER — Encounter: Payer: Self-pay | Admitting: Internal Medicine

## 2014-10-14 ENCOUNTER — Encounter: Payer: Self-pay | Admitting: Cardiothoracic Surgery

## 2014-10-14 ENCOUNTER — Ambulatory Visit (INDEPENDENT_AMBULATORY_CARE_PROVIDER_SITE_OTHER): Payer: Medicare Other | Admitting: Cardiothoracic Surgery

## 2014-10-14 ENCOUNTER — Other Ambulatory Visit: Payer: Medicare Other

## 2014-10-14 ENCOUNTER — Encounter (HOSPITAL_COMMUNITY)
Admission: AD | Disposition: A | Discharge status: Home / Self Care | Payer: Self-pay | Source: Ambulatory Visit | Attending: Cardiothoracic Surgery

## 2014-10-14 VITALS — BP 108/66 | HR 102 | Ht 64.0 in | Wt 119.0 lb

## 2014-10-14 VITALS — BP 118/74 | HR 99 | Temp 98.1°F | Resp 18 | Ht 64.0 in | Wt 119.2 lb

## 2014-10-14 DIAGNOSIS — C3490 Malignant neoplasm of unspecified part of unspecified bronchus or lung: Secondary | ICD-10-CM

## 2014-10-14 DIAGNOSIS — Z4682 Encounter for fitting and adjustment of non-vascular catheter: Secondary | ICD-10-CM | POA: Insufficient documentation

## 2014-10-14 DIAGNOSIS — J90 Pleural effusion, not elsewhere classified: Secondary | ICD-10-CM

## 2014-10-14 DIAGNOSIS — C342 Malignant neoplasm of middle lobe, bronchus or lung: Secondary | ICD-10-CM

## 2014-10-14 DIAGNOSIS — J948 Other specified pleural conditions: Secondary | ICD-10-CM

## 2014-10-14 DIAGNOSIS — J939 Pneumothorax, unspecified: Secondary | ICD-10-CM

## 2014-10-14 HISTORY — PX: REMOVAL OF PLEURAL DRAINAGE CATHETER: SHX5080

## 2014-10-14 SURGERY — REMOVAL, CLOSED DRAINAGE CATHETER SYSTEM, PLEURAL
Anesthesia: LOCAL | Laterality: Right

## 2014-10-14 MED ORDER — LORAZEPAM 0.5 MG PO TABS: 0.5000 mg | ORAL_TABLET | Freq: Three times a day (TID) | ORAL | Status: DC | PRN

## 2014-10-14 NOTE — Progress Notes (Signed)
Patient ID: Dana [name], female   DOB: 1935/04/05, 79 y.o.   MRN: [id number]   Mrs. Dettmer presented to Short Stay at [facility] on 10/14/2014 in order to undergo right Pleur X catheter removal. Prior to removing Pleur X, I attached a three way stop cock to the broken end of the right Pleur X catheter. I removed the clamp over the tubing. I used a syringe to evacuate a small amount of air and trace fluid. After I turned the stop cock to "off", I then placed the clamp back over the tube. I then prepped and draped the area with betadine. I placed 2% local anesthetic around the pleur x tube. I carefully dissected the cuff out, which was right near the opening of the skin. The right Pleur X was removed without difficulty. Patient tolerated the procedure well. 3 silk sutures were placed to close the small wound. Dry 2x2s and Medipore tape were placed. Patient was instructed she may remove this dressing and shower in the morning. She only had to place a band aid over the area if there was any bloody oozing. Change the band aid daily. Although not likely, patient was instructed if she has any breathing difficulties, to call 911. She already has a follow up appointment to see Dr. Servando Snare with a chest x ray.

## 2014-10-14 NOTE — Progress Notes (Signed)
[address]       [place] [address]             [phone]      HPI:  Patient presents today for drainage from her Pleur-x Catheter.  She has been followed by Dr. Servando Snare since placement in July.  The patient's output had decreased significantly since her Talc pleurodesis.  She was draining the catheter every MWF with only 50 cc out output draining.  She contacted the office last week at which time she was instructed to drain every 2 days .  Unfortunately, the patient's husband was unable to get the end of the Pleur-x catheter off to drain on Sunday.  Therefore, he took pliers and pulled on the tip, which resulted in a broken end of the catheter.  He said he placed 2 ends together and got 5cc of output.  Patient returns today with a followup chest x-ray and for consideration of removal of the Pleurx catheter.    Current Outpatient Prescriptions  Medication Sig Dispense Refill  . aspirin EC 81 MG tablet Take 81 mg by mouth daily.      . Calcium Carbonate-Vitamin D 600-400 MG-UNIT per chew tablet Chew 1 tablet by mouth 2 (two) times daily.      . cholecalciferol (VITAMIN D) 1000 UNITS tablet Take 1,000 Units by mouth daily.       . Coenzyme Q10 (COQ10) 100 MG CAPS Take 1 capsule by mouth daily.       Marland Kitchen conjugated estrogens (PREMARIN) vaginal cream Place 1 Applicatorful vaginally every Sunday.      . Cyanocobalamin (B-12) 1000 MCG CAPS Take 1,000 mcg by mouth 3 (three) times daily with meals.       . erlotinib (TARCEVA) 100 MG tablet Take 1 tablet (100 mg total) by mouth every evening. Take on an empty stomach 1 hour before meals or 2 hours after  30 tablet  2  . Hydrocodone-Acetaminophen (VICODIN) 5-300 MG TABS Take 1 tablet by mouth 4 (four) times daily as needed (for pain).       . hydroxychloroquine (PLAQUENIL) 200 MG tablet Take 400 mg by mouth every morning.       . loperamide (IMODIUM A-D) 2 MG tablet Take 1 mg by mouth 4 (four) times daily as needed for  diarrhea or loose stools.       Marland Kitchen LORazepam (ATIVAN) 0.5 MG tablet Take 1 tablet (0.5 mg total) by mouth every 8 (eight) hours as needed for anxiety.  30 tablet  0  . Polyethyl Glycol-Propyl Glycol (SYSTANE OP) Place 1 drop into both eyes at bedtime as needed (for dry eyes.).       Marland Kitchen tiotropium (SPIRIVA) 18 MCG inhalation capsule Place 18 mcg into inhaler and inhale every morning.       . loratadine (CLARITIN) 10 MG tablet Take 10 mg by mouth daily as needed for allergies.       Marland Kitchen oxyCODONE (OXY IR/ROXICODONE) 5 MG immediate release tablet Take 1 tablet (5 mg total) by mouth every 4 (four) hours as needed for moderate pain.  30 tablet  0  . Simethicone (GAS-X PO) Take 1 tablet by mouth 2 (two) times daily as needed (for gas).        No current facility-administered medications for this visit.    Physical Exam:  BP 108/66  Pulse 102  Ht 5\' 4"  (1.626 m)  Wt 119 lb (53.978 kg)  BMI 20.42 kg/m2  SpO2 97%  Gen: no apparent distress Heart:  RRR Lungs: diminished breath sounds right base Pleurx catheter is clamped, pressing intact  Dg Chest 2 View  10/14/2014   CLINICAL DATA:  Followup lung cancer, treated 2000 and with radiation chemotherapy, former smoker  EXAM: CHEST  2 VIEW  COMPARISON:  09/13/2014, 09/2014  FINDINGS: Unchanged right chest tube. Unchanged small right pneumothorax measuring 8 mm. Small a moderate right effusion, new from 09/13/2014 but similar to 09/2014. Bands of discoid atelectasis right lung are similar to 09/2014 as well.  The left lung is clear.  IMPRESSION: Findings of status post pleurodesis with small hydro pneumothorax on the right similar to CT scan performed 09/2014. Pleural effusion may be mildly larger.   Electronically Signed   By: Skipper Cliche M.D.   On: 10/14/2014 13:43   Ct Chest W Contrast  10/12/2014   CLINICAL DATA:  Lung cancer with chemotherapy and radiation in 2010. Weight loss and shortness of breath. Recent talc pleurodesis. Right chest tube in  place.  EXAM: CT CHEST WITH CONTRAST  TECHNIQUE: Multidetector CT imaging of the chest was performed during intravenous contrast administration.  CONTRAST:  1mL OMNIPAQUE IOHEXOL 300 MG/ML  SOLN  COMPARISON:  Chest x-ray 09/13/2014.  Chest CT 03/24/2014.  FINDINGS: Right pleural catheter is in place with the tip in the anterior right upper hemithorax. There is a right hydro pneumothorax. Decreasing pleural effusion component since prior CT. Pneumothorax components likely 10%.  Paramediastinal opacities are noted with air bronchograms, likely postradiation scarring. No suspicious pulmonary nodules or masses. Left lung is clear.  No mediastinal, hilar, or axillary adenopathy. Chest wall soft tissues are unremarkable. Imaging into the upper abdomen shows no acute findings.  No acute bony abnormality or focal bone lesion.  IMPRESSION: Right hydro pneumothorax post pleurodesis. Right chest tube is in place. Decreasing effusion component. Pneumothorax component approximately 10%.  Stable radiation fibrosis changes in the medial right lung.   Electronically Signed   By: Rolm Baptise M.D.   On: 10/12/2014 12:27   Plan:   Recurrent Malignant pleural effusion- drainage has significantly decreased.  However, her Pleur-x catheter is no longer functioning, due to husband breaking the end of the catheter.  We will plan on removing the Pleurx today in short stay. I'll see the patient back in one week with followup chest x-ray.

## 2014-10-14 NOTE — Progress Notes (Signed)
Dana [name] Telephone:(336) [phone]   Fax:(336) [place], [place] [address]  DIAGNOSIS AND STAGE: : Stage IIIB (T2a, N3, M0) nonsmall cell lung cancer, adenocarcinoma, in a never smoker patient with positive EGFR mutation diagnosed in January 2010.   PRIOR THERAPY:  1) Status post concurrent chemoradiation with weekly carboplatin and paclitaxel; last dose given March 21, 2009.  2) Tarceva at 150 mg p.o. daily, status post approximately 48 months of treatment, discontinued secondary to persistent diarrhea.  3) status post right Pleurx catheter placement for right nonmalignant pleural effusion.  CURRENT THERAPY: Tarceva at 100 mg p.o. Daily, started 03/19/2013, status post 15 months of treatment.   CHEMOTHERAPY INTENT: Maintenance.  CURRENT # OF CHEMOTHERAPY CYCLES: 64 CURRENT ANTIEMETICS: Compazine  CURRENT SMOKING STATUS: Never smoker  ORAL CHEMOTHERAPY AND CONSENT: Tarceva  CURRENT BISPHOSPHONATES USE: None  PAIN MANAGEMENT: 5/10 low back pain and sciatica currently on naproxen.  NARCOTICS INDUCED CONSTIPATION: None  LIVING WILL AND CODE STATUS: Full code but no long-term resuscitation.   INTERVAL HISTORY: Dana [name] 79 y.o. female returns to the clinic today for followup visit. She is tolerating her current treatment with Tarceva fairly well with no significant adverse effects. She underwent talc pleurodesis under the care of Dr. Servando Snare.  She still draining around 50 cc every other day. She has an appointment with Dr. Servando Snare later today for evaluation of the Pleurx catheter. She denied having any significant skin rash or diarrhea. She has no chest pain, cough or hemoptysis. Her weight has been stable. He has no nausea or vomiting. she had repeat CT scan of the chest performed recently and she is here for evaluation and discussion of her scan results.   MEDICAL HISTORY: Past Medical History    Diagnosis Date  . Hemorrhoid   . COPD (chronic obstructive pulmonary disease)   . GERD (gastroesophageal reflux disease)   . History of lung cancer ONCOLOGIST--  DR [facility]--  LAST CT ,  NO RECURRENCE OR METS    DX JAN 2010 --  STAGE IIIA  NON-SMALL CELL ADENOCARCINOMA (RIGHT MIDDLE LOBE)---  S/P  CHEMORADIATIO THERAPY  (COMPLETE 03-21-2009)  . History of anal fissures   . Arthritis     HANDS,  WRISTS  . Osteoporosis   . Right wrist pain     MASS  . Dyspnea on exertion   . Normal cardiac stress test     2007  PER PT  . Wears glasses   . Rash, skin     RIGHT FOREARM/ HAND  . lung ca dx'd 2010    ALLERGIES:  is allergic to amoxicillin.  MEDICATIONS:  Current Outpatient Prescriptions  Medication Sig Dispense Refill  . aspirin EC 81 MG tablet Take 81 mg by mouth daily.      . Calcium Carbonate-Vitamin D 600-400 MG-UNIT per chew tablet Chew 1 tablet by mouth 2 (two) times daily.      . cholecalciferol (VITAMIN D) 1000 UNITS tablet Take 1,000 Units by mouth daily.       . Coenzyme Q10 (COQ10) 100 MG CAPS Take 1 capsule by mouth daily.       Marland Kitchen conjugated estrogens (PREMARIN) vaginal cream Place 1 Applicatorful vaginally every Sunday.      . Cyanocobalamin (B-12) 1000 MCG CAPS Take 1,000 mcg by mouth 3 (three) times daily with meals.       . erlotinib (TARCEVA) 100 MG tablet Take  1 tablet (100 mg total) by mouth every evening. Take on an empty stomach 1 hour before meals or 2 hours after  30 tablet  2  . Hydrocodone-Acetaminophen (VICODIN) 5-300 MG TABS Take 1 tablet by mouth 4 (four) times daily as needed (for pain).       . hydroxychloroquine (PLAQUENIL) 200 MG tablet Take 400 mg by mouth every morning.       . loperamide (IMODIUM A-D) 2 MG tablet Take 1 mg by mouth 4 (four) times daily as needed for diarrhea or loose stools.       Marland Kitchen loratadine (CLARITIN) 10 MG tablet Take 10 mg by mouth daily as needed for allergies.       Marland Kitchen LORazepam (ATIVAN) 0.5 MG tablet Take 1 tablet (0.5 mg  total) by mouth every 8 (eight) hours as needed for anxiety.  30 tablet  0  . oxyCODONE (OXY IR/ROXICODONE) 5 MG immediate release tablet Take 1 tablet (5 mg total) by mouth every 4 (four) hours as needed for moderate pain.  30 tablet  0  . Polyethyl Glycol-Propyl Glycol (SYSTANE OP) Place 1 drop into both eyes at bedtime as needed (for dry eyes.).       Marland Kitchen Simethicone (GAS-X PO) Take 1 tablet by mouth 2 (two) times daily as needed (for gas).       Marland Kitchen tiotropium (SPIRIVA) 18 MCG inhalation capsule Place 18 mcg into inhaler and inhale every morning.        No current facility-administered medications for this visit.    SURGICAL HISTORY:  Past Surgical History  Procedure Laterality Date  . Anal fissure repair  07/09/2011    INTERNAL SPHINCTEROTOMY  . Knee arthroscopy Right 1999  . Thumb arthroscopy Left     - removed bone spur  . Thoracoscopy  01-26-2009    w/   lung/  node biopsy's  . Benign excision left breast central duct  02/1999  . Vault suspension plus cystocele repair with graft  06-13-2010  . Transthoracic echocardiogram  12-23-2008    NORMAL LV/  EF 65-70%/  MILD MR  &  TR  . Total abdominal hysterectomy w/ bilateral salpingoophorectomy  1982    W/  APPENDECTOMY  . Tonsillectomy  AS CHILD  . Excision right wrist mass  2012  . Mass excision Right 03/11/2014    Procedure: RIGHT WRIST DEEP MASS EXCISION WITH CULTURE AND BIOSPY;  Surgeon: Linna Hoff, MD;  Location: [place];  Service: Orthopedics;  Laterality: Right;  . Chest tube insertion Right 07/14/2014    Procedure: INSERTION PLEURAL DRAINAGE CATHETER RIGHT CHEST;  Surgeon: Grace Isaac, MD;  Location: [place];  Service: Thoracic;  Laterality: Right;  . Talc pleurodesis Right 09/16/2014    Procedure: TALC PLEURADESIS/ slurry;  Surgeon: Grace Isaac, MD;  Location: [place];  Service: Thoracic;  Laterality: Right;    REVIEW OF SYSTEMS:  Constitutional: negative Eyes: negative Ears, nose, mouth, throat,  and face: negative Respiratory: positive for dyspnea on exertion Cardiovascular: negative Gastrointestinal: negative Genitourinary:negative Integument/breast: negative Hematologic/lymphatic: negative Musculoskeletal:negative Neurological: negative Behavioral/Psych: negative Endocrine: negative Allergic/Immunologic: negative   PHYSICAL EXAMINATION: General appearance: alert, cooperative and no distress Head: Normocephalic, without obvious abnormality, atraumatic Neck: no adenopathy, no JVD, supple, symmetrical, trachea midline and thyroid not enlarged, symmetric, no tenderness/mass/nodules Lymph nodes: Cervical, supraclavicular, and axillary nodes normal. Resp: diminished breath sounds RLL and dullness to percussion RLL Cardio: regular rate and rhythm, S1, S2 normal, no murmur, click, rub or gallop  GI: soft, non-tender; bowel sounds normal; no masses,  no organomegaly Extremities: extremities normal, atraumatic, no cyanosis or edema Neurologic: Alert and oriented X 3, normal strength and tone. Normal symmetric reflexes. Normal coordination and gait  ECOG PERFORMANCE STATUS: 1 - Symptomatic but completely ambulatory  Blood pressure 118/74, pulse 99, temperature 98.1 F (36.7 C), temperature source Oral, resp. rate 18, height $RemoveBe'5\' 4"'IXclxSWin$  (1.626 m), weight 119 lb 3.2 oz (54.069 kg), SpO2 99.00%.  LABORATORY DATA: Lab Results  Component Value Date   WBC 6.5 10/12/2014   HGB 11.4* 10/12/2014   HCT 34.6* 10/12/2014   MCV 92.0 10/12/2014   PLT 380 10/12/2014      Chemistry      Component Value Date/Time   NA 144 10/12/2014 0914   NA 143 07/14/2014 0409   NA 145 11/09/2011 1345   K 4.2 10/12/2014 0914   K 4.1 07/14/2014 0409   K 3.9 11/09/2011 1345   CL 106 07/14/2014 0409   CL 108* 06/03/2013 0939   CL 104 11/09/2011 1345   CO2 26 10/12/2014 0914   CO2 25 07/14/2014 0409   CO2 28 11/09/2011 1345   BUN 17.9 10/12/2014 0914   BUN 12 07/14/2014 0409   BUN 20 11/09/2011 1345    CREATININE 0.8 10/12/2014 0914   CREATININE 0.83 07/14/2014 0409   CREATININE 0.6 11/09/2011 1345      Component Value Date/Time   CALCIUM 9.5 10/12/2014 0914   CALCIUM 8.4 07/14/2014 0409   CALCIUM 8.7 11/09/2011 1345   ALKPHOS 65 10/12/2014 0914   ALKPHOS 60 07/13/2014 2023   ALKPHOS 52 11/09/2011 1345   AST 23 10/12/2014 0914   AST 24 07/13/2014 2023   AST 20 11/09/2011 1345   ALT 29 10/12/2014 0914   ALT 23 07/13/2014 2023   ALT 21 11/09/2011 1345   BILITOT 0.60 10/12/2014 0914   BILITOT 0.9 07/13/2014 2023   BILITOT 0.60 11/09/2011 1345       RADIOGRAPHIC STUDIES: Ct Chest W Contrast  10/12/2014   CLINICAL DATA:  Lung cancer with chemotherapy and radiation in 2010. Weight loss and shortness of breath. Recent talc pleurodesis. Right chest tube in place.  EXAM: CT CHEST WITH CONTRAST  TECHNIQUE: Multidetector CT imaging of the chest was performed during intravenous contrast administration.  CONTRAST:  49mL OMNIPAQUE IOHEXOL 300 MG/ML  SOLN  COMPARISON:  Chest x-ray 09/13/2014.  Chest CT 03/24/2014.  FINDINGS: Right pleural catheter is in place with the tip in the anterior right upper hemithorax. There is a right hydro pneumothorax. Decreasing pleural effusion component since prior CT. Pneumothorax components likely 10%.  Paramediastinal opacities are noted with air bronchograms, likely postradiation scarring. No suspicious pulmonary nodules or masses. Left lung is clear.  No mediastinal, hilar, or axillary adenopathy. Chest wall soft tissues are unremarkable. Imaging into the upper abdomen shows no acute findings.  No acute bony abnormality or focal bone lesion.  IMPRESSION: Right hydro pneumothorax post pleurodesis. Right chest tube is in place. Decreasing effusion component. Pneumothorax component approximately 10%.  Stable radiation fibrosis changes in the medial right lung.   Electronically Signed   By: Rolm Baptise M.D.   On: 10/12/2014 12:27   ASSESSMENT AND PLAN: This is a very  pleasant 79 years old white female with history of stage IIIa non-small cell lung cancer status post concurrent chemoradiation and has been on treatment with Tarceva for the last 63 months with no significant evidence for disease progression.  She is tolerating her treatment well.  The recent CT scan of the chest showed no evidence for disease progression. It continues to show the right hydropneumothorax with 10% of pneumothorax. She is followed by Dr. Servando Snare for this problem. I recommended for her to continue her current treatment with Tarceva 100 mg by mouth daily. She would come back for followup visit in one month for reevaluation with repeat blood work. She was advised to call immediately if she has any concerning symptoms in the interval.  The patient voices understanding of current disease status and treatment options and is in agreement with the current care plan.  All questions were answered. The patient knows to call the clinic with any problems, questions or concerns. We can certainly see the patient much sooner if necessary.  Disclaimer: This note was dictated with voice recognition software. Similar sounding words can inadvertently be transcribed and may not be corrected upon review.

## 2014-10-14 NOTE — Discharge Instructions (Signed)
Discharge instructions were reviewed with patient by donelle zimmerman pa

## 2014-10-15 ENCOUNTER — Encounter (HOSPITAL_COMMUNITY): Payer: Self-pay | Admitting: *Deleted

## 2014-10-15 ENCOUNTER — Encounter (HOSPITAL_COMMUNITY): Payer: Self-pay | Admitting: Cardiothoracic Surgery

## 2014-10-18 ENCOUNTER — Telehealth: Payer: Self-pay | Admitting: Internal Medicine

## 2014-10-18 ENCOUNTER — Ambulatory Visit
Admission: RE | Admit: 2014-10-18 | Discharge: 2014-10-18 | Disposition: A | Discharge status: Home / Self Care | Payer: Medicare Other | Source: Ambulatory Visit | Attending: Family Medicine | Admitting: Family Medicine

## 2014-10-18 DIAGNOSIS — M8580 Other specified disorders of bone density and structure, unspecified site: Secondary | ICD-10-CM

## 2014-10-18 NOTE — Telephone Encounter (Signed)
, °

## 2014-10-19 ENCOUNTER — Other Ambulatory Visit: Payer: Self-pay | Admitting: Cardiothoracic Surgery

## 2014-10-19 ENCOUNTER — Other Ambulatory Visit: Payer: Self-pay | Admitting: Internal Medicine

## 2014-10-19 DIAGNOSIS — C3490 Malignant neoplasm of unspecified part of unspecified bronchus or lung: Secondary | ICD-10-CM

## 2014-10-21 ENCOUNTER — Encounter: Payer: Self-pay | Admitting: Cardiothoracic Surgery

## 2014-10-21 ENCOUNTER — Ambulatory Visit
Admission: RE | Admit: 2014-10-21 | Discharge: 2014-10-21 | Disposition: A | Discharge status: Home / Self Care | Payer: Medicare Other | Source: Ambulatory Visit | Attending: Cardiothoracic Surgery | Admitting: Cardiothoracic Surgery

## 2014-10-21 ENCOUNTER — Ambulatory Visit (INDEPENDENT_AMBULATORY_CARE_PROVIDER_SITE_OTHER): Payer: Self-pay | Admitting: Cardiothoracic Surgery

## 2014-10-21 VITALS — BP 121/74 | HR 91 | Ht 64.0 in | Wt 121.0 lb

## 2014-10-21 DIAGNOSIS — J90 Pleural effusion, not elsewhere classified: Secondary | ICD-10-CM

## 2014-10-21 DIAGNOSIS — J948 Other specified pleural conditions: Secondary | ICD-10-CM

## 2014-10-21 DIAGNOSIS — C3490 Malignant neoplasm of unspecified part of unspecified bronchus or lung: Secondary | ICD-10-CM

## 2014-10-21 NOTE — Progress Notes (Signed)
[address]       [place] [address]             [phone]      HPI:  Patient presents today after  removal of her Pleur-x Catheter.  She has been followed  since placement in July. She had the Pleurx removed last week and returns today for follow-up chest x-ray    Current Outpatient Prescriptions  Medication Sig Dispense Refill  . aspirin EC 81 MG tablet Take 81 mg by mouth daily.      . Calcium Carbonate-Vitamin D 600-400 MG-UNIT per chew tablet Chew 1 tablet by mouth 2 (two) times daily.      . cholecalciferol (VITAMIN D) 1000 UNITS tablet Take 1,000 Units by mouth daily.       . Coenzyme Q10 (COQ10) 100 MG CAPS Take 1 capsule by mouth daily.       Marland Kitchen conjugated estrogens (PREMARIN) vaginal cream Place 1 Applicatorful vaginally every Sunday.      . Cyanocobalamin (B-12) 1000 MCG CAPS Take 1,000 mcg by mouth 3 (three) times daily with meals.       . erlotinib (TARCEVA) 100 MG tablet Take 1 tablet (100 mg total) by mouth every evening. Take on an empty stomach 1 hour before meals or 2 hours after  30 tablet  2  . hydroxychloroquine (PLAQUENIL) 200 MG tablet Take 400 mg by mouth every morning.       . loratadine (CLARITIN) 10 MG tablet Take 10 mg by mouth daily as needed for allergies.       Marland Kitchen LORazepam (ATIVAN) 0.5 MG tablet Take 1 tablet (0.5 mg total) by mouth every 8 (eight) hours as needed for anxiety.  30 tablet  0  . Polyethyl Glycol-Propyl Glycol (SYSTANE OP) Place 1 drop into both eyes at bedtime as needed (for dry eyes.).       Marland Kitchen tiotropium (SPIRIVA) 18 MCG inhalation capsule Place 18 mcg into inhaler and inhale every morning.       Marland Kitchen Hydrocodone-Acetaminophen (VICODIN) 5-300 MG TABS Take 1 tablet by mouth 4 (four) times daily as needed (for pain).       Marland Kitchen loperamide (IMODIUM A-D) 2 MG tablet Take 1 mg by mouth 4 (four) times daily as needed for diarrhea or loose stools.       Marland Kitchen oxyCODONE (OXY IR/ROXICODONE) 5 MG immediate release tablet Take 1 tablet  (5 mg total) by mouth every 4 (four) hours as needed for moderate pain.  30 tablet  0  . Simethicone (GAS-X PO) Take 1 tablet by mouth 2 (two) times daily as needed (for gas).        No current facility-administered medications for this visit.    Physical Exam:  BP 121/74  Pulse 91  Ht 5\' 4"  (1.626 m)  Wt 121 lb (54.885 kg)  BMI 20.76 kg/m2  SpO2 93%  Gen: no apparent distress Heart:  RRR Lungs: diminished breath sounds right base The Pleurx site is completely healed now the sutures removed.    Dg Chest 2 View  10/21/2014   CLINICAL DATA:  Malignant neoplasm of lung.  EXAM: CHEST  2 VIEW  COMPARISON:  10/14/2014  FINDINGS: Right pleural effusion unchanged. Right lower lobe airspace consolidation also unchanged.  Interval resolution of right apical pneumothorax. Right-sided chest tube has been removed in the interval.  Left lung remains clear.  Negative for heart failure.  IMPRESSION: Right pleural effusion and right lower lobe  airspace consolidation is unchanged.  Resolution of right-sided pneumothorax.   Electronically Signed   By: Franchot Gallo M.D.   On: 10/21/2014 12:57   Dg Chest 2 View  10/14/2014   CLINICAL DATA:  Followup lung cancer, treated 2000 and with radiation chemotherapy, former smoker  EXAM: CHEST  2 VIEW  COMPARISON:  09/13/2014, 09/2014  FINDINGS: Unchanged right chest tube. Unchanged small right pneumothorax measuring 8 mm. Small a moderate right effusion, new from 09/13/2014 but similar to 09/2014. Bands of discoid atelectasis right lung are similar to 09/2014 as well.  The left lung is clear.  IMPRESSION: Findings of status post pleurodesis with small hydro pneumothorax on the right similar to CT scan performed 09/2014. Pleural effusion may be mildly larger.   Electronically Signed   By: Skipper Cliche M.D.   On: 10/14/2014 13:43   Ct Chest W Contrast  10/12/2014   CLINICAL DATA:  Lung cancer with chemotherapy and radiation in 2010. Weight loss and shortness of  breath. Recent talc pleurodesis. Right chest tube in place.  EXAM: CT CHEST WITH CONTRAST  TECHNIQUE: Multidetector CT imaging of the chest was performed during intravenous contrast administration.  CONTRAST:  25mL OMNIPAQUE IOHEXOL 300 MG/ML  SOLN  COMPARISON:  Chest x-ray 09/13/2014.  Chest CT 03/24/2014.  FINDINGS: Right pleural catheter is in place with the tip in the anterior right upper hemithorax. There is a right hydro pneumothorax. Decreasing pleural effusion component since prior CT. Pneumothorax components likely 10%.  Paramediastinal opacities are noted with air bronchograms, likely postradiation scarring. No suspicious pulmonary nodules or masses. Left lung is clear.  No mediastinal, hilar, or axillary adenopathy. Chest wall soft tissues are unremarkable. Imaging into the upper abdomen shows no acute findings.  No acute bony abnormality or focal bone lesion.  IMPRESSION: Right hydro pneumothorax post pleurodesis. Right chest tube is in place. Decreasing effusion component. Pneumothorax component approximately 10%.  Stable radiation fibrosis changes in the medial right lung.   Electronically Signed   By: Rolm Baptise M.D.   On: 10/12/2014 12:27   Plan:   Recurrent Malignant pleural effusion- drainage has stabilized after placement of Pleurx and subsequent talc pleurodesis.   The Pleurx has been removed, follow-up chest x-ray today 1 week later shows stable right chest without increasing effusion Plan to see her back in 4 weeks with a follow-up chest x-ray, she is aware to call sooner if she has any shortness of breath. Grace Isaac MD      [address] ,[place] [address] Office [phone]   Beeper [phone]

## 2014-11-15 ENCOUNTER — Other Ambulatory Visit (HOSPITAL_BASED_OUTPATIENT_CLINIC_OR_DEPARTMENT_OTHER): Payer: Medicare Other

## 2014-11-15 ENCOUNTER — Encounter: Payer: Self-pay | Admitting: Internal Medicine

## 2014-11-15 ENCOUNTER — Telehealth: Payer: Self-pay | Admitting: Internal Medicine

## 2014-11-15 ENCOUNTER — Ambulatory Visit (HOSPITAL_BASED_OUTPATIENT_CLINIC_OR_DEPARTMENT_OTHER): Payer: Medicare Other | Admitting: Internal Medicine

## 2014-11-15 VITALS — BP 113/71 | HR 100 | Temp 98.0°F | Resp 18 | Ht 64.0 in | Wt 121.0 lb

## 2014-11-15 DIAGNOSIS — C3490 Malignant neoplasm of unspecified part of unspecified bronchus or lung: Secondary | ICD-10-CM

## 2014-11-15 DIAGNOSIS — C3491 Malignant neoplasm of unspecified part of right bronchus or lung: Secondary | ICD-10-CM

## 2014-11-15 LAB — CBC WITH DIFFERENTIAL/PLATELET
BASO%: 0.4 % (ref 0.0–2.0)
BASOS ABS: 0 10*3/uL (ref 0.0–0.1)
EOS%: 4.4 % (ref 0.0–7.0)
Eosinophils Absolute: 0.2 10*3/uL (ref 0.0–0.5)
HCT: 33.5 % — ABNORMAL LOW (ref 34.8–46.6)
HGB: 10.9 g/dL — ABNORMAL LOW (ref 11.6–15.9)
LYMPH#: 0.5 10*3/uL — AB (ref 0.9–3.3)
LYMPH%: 8.3 % — ABNORMAL LOW (ref 14.0–49.7)
MCH: 29.9 pg (ref 25.1–34.0)
MCHC: 32.5 g/dL (ref 31.5–36.0)
MCV: 91.8 fL (ref 79.5–101.0)
MONO#: 0.5 10*3/uL (ref 0.1–0.9)
MONO%: 8.7 % (ref 0.0–14.0)
NEUT#: 4.3 10*3/uL (ref 1.5–6.5)
NEUT%: 78.2 % — ABNORMAL HIGH (ref 38.4–76.8)
Platelets: 316 10*3/uL (ref 145–400)
RBC: 3.65 10*6/uL — ABNORMAL LOW (ref 3.70–5.45)
RDW: 13.7 % (ref 11.2–14.5)
WBC: 5.4 10*3/uL (ref 3.9–10.3)

## 2014-11-15 LAB — COMPREHENSIVE METABOLIC PANEL (CC13)
ALK PHOS: 56 U/L (ref 40–150)
ALT: 22 U/L (ref 0–55)
AST: 26 U/L (ref 5–34)
Albumin: 3 g/dL — ABNORMAL LOW (ref 3.5–5.0)
Anion Gap: 9 mEq/L (ref 3–11)
BUN: 20.3 mg/dL (ref 7.0–26.0)
CHLORIDE: 108 meq/L (ref 98–109)
CO2: 27 mEq/L (ref 22–29)
CREATININE: 0.9 mg/dL (ref 0.6–1.1)
Calcium: 9.2 mg/dL (ref 8.4–10.4)
Glucose: 92 mg/dl (ref 70–140)
POTASSIUM: 4.2 meq/L (ref 3.5–5.1)
Sodium: 144 mEq/L (ref 136–145)
Total Bilirubin: 0.47 mg/dL (ref 0.20–1.20)
Total Protein: 5.8 g/dL — ABNORMAL LOW (ref 6.4–8.3)

## 2014-11-15 NOTE — Progress Notes (Signed)
[facility] [phone]   Fax:(336) [place], [place] [address]  DIAGNOSIS AND STAGE: : Stage IIIB (T2a, N3, M0) nonsmall cell lung cancer, adenocarcinoma, in a never smoker patient with positive EGFR mutation diagnosed in January 2010.   PRIOR THERAPY:  1) Status post concurrent chemoradiation with weekly carboplatin and paclitaxel; last dose given March 21, 2009.  2) Tarceva at 150 mg p.o. daily, status post approximately 48 months of treatment, discontinued secondary to persistent diarrhea.  3) status post right Pleurx catheter placement for right nonmalignant pleural effusion.  CURRENT THERAPY: Tarceva at 100 mg p.o. Daily, started 03/19/2013, status post 16 months of treatment.   CHEMOTHERAPY INTENT: Maintenance.  CURRENT # OF CHEMOTHERAPY CYCLES: 65 CURRENT ANTIEMETICS: Compazine  CURRENT SMOKING STATUS: Never smoker  ORAL CHEMOTHERAPY AND CONSENT: Tarceva  CURRENT BISPHOSPHONATES USE: None  PAIN MANAGEMENT: 5/10 low back pain and sciatica currently on naproxen.  NARCOTICS INDUCED CONSTIPATION: None  LIVING WILL AND CODE STATUS: Full code but no long-term resuscitation.   INTERVAL HISTORY: Dana [name] 79 y.o. female returns to the clinic today for followup visit. She is tolerating her current treatment with Tarceva fairly well with no significant adverse effects. She is feeling much better these days and back to her baseline before July 2015 when she had the pneumothorax. She denied having any significant skin rash or diarrhea. She has no chest pain, cough or hemoptysis. Her weight has been stable. He has no nausea or vomiting. She has no nausea or vomiting, no fever or chills.  MEDICAL HISTORY: Past Medical History  Diagnosis Date  . Hemorrhoid   . COPD (chronic obstructive pulmonary disease)   . GERD (gastroesophageal reflux disease)   . History of lung cancer ONCOLOGIST--   DR [facility]--  LAST CT ,  NO RECURRENCE OR METS    DX JAN 2010 --  STAGE IIIA  NON-SMALL CELL ADENOCARCINOMA (RIGHT MIDDLE LOBE)---  S/P  CHEMORADIATIO THERAPY  (COMPLETE 03-21-2009)  . History of anal fissures   . Arthritis     HANDS,  WRISTS  . Osteoporosis   . Right wrist pain     MASS  . Dyspnea on exertion   . Normal cardiac stress test     2007  PER PT  . Wears glasses   . Rash, skin     RIGHT FOREARM/ HAND  . lung ca dx'd 2010    ALLERGIES:  is allergic to amoxicillin.  MEDICATIONS:  Current Outpatient Prescriptions  Medication Sig Dispense Refill  . aspirin EC 81 MG tablet Take 81 mg by mouth daily.    . Calcium Carbonate-Vitamin D 600-400 MG-UNIT per chew tablet Chew 1 tablet by mouth 2 (two) times daily.    . cholecalciferol (VITAMIN D) 1000 UNITS tablet Take 1,000 Units by mouth daily.     . Coenzyme Q10 (COQ10) 100 MG CAPS Take 1 capsule by mouth daily.     Marland Kitchen conjugated estrogens (PREMARIN) vaginal cream Place 1 Applicatorful vaginally every Sunday.    . Cyanocobalamin (B-12) 1000 MCG CAPS Take 1,000 mcg by mouth 3 (three) times daily with meals.     . erlotinib (TARCEVA) 100 MG tablet Take 1 tablet (100 mg total) by mouth every evening. Take on an empty stomach 1 hour before meals or 2 hours after 30 tablet 2  . Hydrocodone-Acetaminophen (VICODIN) 5-300 MG TABS Take 1 tablet by mouth 4 (four) times daily  as needed (for pain).     . hydroxychloroquine (PLAQUENIL) 200 MG tablet Take 400 mg by mouth every morning.     . loperamide (IMODIUM A-D) 2 MG tablet Take 1 mg by mouth 4 (four) times daily as needed for diarrhea or loose stools.     Marland Kitchen loratadine (CLARITIN) 10 MG tablet Take 10 mg by mouth daily as needed for allergies.     Marland Kitchen LORazepam (ATIVAN) 0.5 MG tablet Take 1 tablet (0.5 mg total) by mouth every 8 (eight) hours as needed for anxiety. 30 tablet 0  . oxyCODONE (OXY IR/ROXICODONE) 5 MG immediate release tablet Take 1 tablet (5 mg total) by mouth every 4 (four)  hours as needed for moderate pain. 30 tablet 0  . Polyethyl Glycol-Propyl Glycol (SYSTANE OP) Place 1 drop into both eyes at bedtime as needed (for dry eyes.).     Marland Kitchen Simethicone (GAS-X PO) Take 1 tablet by mouth 2 (two) times daily as needed (for gas).     Marland Kitchen tiotropium (SPIRIVA) 18 MCG inhalation capsule Place 18 mcg into inhaler and inhale every morning.      No current facility-administered medications for this visit.    SURGICAL HISTORY:  Past Surgical History  Procedure Laterality Date  . Anal fissure repair  07/09/2011    INTERNAL SPHINCTEROTOMY  . Knee arthroscopy Right 1999  . Thumb arthroscopy Left     - removed bone spur  . Thoracoscopy  01-26-2009    w/   lung/  node biopsy's  . Benign excision left breast central duct  02/1999  . Vault suspension plus cystocele repair with graft  06-13-2010  . Transthoracic echocardiogram  12-23-2008    NORMAL LV/  EF 65-70%/  MILD MR  &  TR  . Total abdominal hysterectomy w/ bilateral salpingoophorectomy  1982    W/  APPENDECTOMY  . Tonsillectomy  AS CHILD  . Excision right wrist mass  2012  . Mass excision Right 03/11/2014    Procedure: RIGHT WRIST DEEP MASS EXCISION WITH CULTURE AND BIOSPY;  Surgeon: Linna Hoff, MD;  Location: [facility];  Service: Orthopedics;  Laterality: Right;  . Chest tube insertion Right 07/14/2014    Procedure: INSERTION PLEURAL DRAINAGE CATHETER RIGHT CHEST;  Surgeon: Grace Isaac, MD;  Location: [facility];  Service: Thoracic;  Laterality: Right;  . Talc pleurodesis Right 09/16/2014    Procedure: TALC PLEURADESIS/ slurry;  Surgeon: Grace Isaac, MD;  Location: [facility];  Service: Thoracic;  Laterality: Right;  . Removal of pleural drainage catheter Right 10/14/2014    Procedure: REMOVAL OF PLEURAL DRAINAGE CATHETER;  Surgeon: Grace Isaac, MD;  Location: [facility];  Service: Thoracic;  Laterality: Right;    REVIEW OF SYSTEMS:  Constitutional: negative Eyes: negative Ears, nose, mouth,  throat, and face: negative Respiratory: positive for dyspnea on exertion Cardiovascular: negative Gastrointestinal: negative Genitourinary:negative Integument/breast: negative Hematologic/lymphatic: negative Musculoskeletal:negative Neurological: negative Behavioral/Psych: negative Endocrine: negative Allergic/Immunologic: negative   PHYSICAL EXAMINATION: General appearance: alert, cooperative and no distress Head: Normocephalic, without obvious abnormality, atraumatic Neck: no adenopathy, no JVD, supple, symmetrical, trachea midline and thyroid not enlarged, symmetric, no tenderness/mass/nodules Lymph nodes: Cervical, supraclavicular, and axillary nodes normal. Resp: diminished breath sounds RLL and dullness to percussion RLL Cardio: regular rate and rhythm, S1, S2 normal, no murmur, click, rub or gallop GI: soft, non-tender; bowel sounds normal; no masses,  no organomegaly Extremities: extremities normal, atraumatic, no cyanosis or edema Neurologic: Alert and oriented X 3, normal strength  and tone. Normal symmetric reflexes. Normal coordination and gait  ECOG PERFORMANCE STATUS: 1 - Symptomatic but completely ambulatory  Blood pressure 113/71, pulse 100, temperature 98 F (36.7 C), temperature source Oral, resp. rate 18, height $RemoveBe'5\' 4"'JnPUHiBhR$  (1.626 m), weight 121 lb (54.885 kg), SpO2 100 %.  LABORATORY DATA: Lab Results  Component Value Date   WBC 5.4 11/15/2014   HGB 10.9* 11/15/2014   HCT 33.5* 11/15/2014   MCV 91.8 11/15/2014   PLT 316 11/15/2014      Chemistry      Component Value Date/Time   NA 144 10/12/2014 0914   NA 143 07/14/2014 0409   NA 145 11/09/2011 1345   K 4.2 10/12/2014 0914   K 4.1 07/14/2014 0409   K 3.9 11/09/2011 1345   CL 106 07/14/2014 0409   CL 108* 06/03/2013 0939   CL 104 11/09/2011 1345   CO2 26 10/12/2014 0914   CO2 25 07/14/2014 0409   CO2 28 11/09/2011 1345   BUN 17.9 10/12/2014 0914   BUN 12 07/14/2014 0409   BUN 20 11/09/2011 1345    CREATININE 0.8 10/12/2014 0914   CREATININE 0.83 07/14/2014 0409   CREATININE 0.6 11/09/2011 1345      Component Value Date/Time   CALCIUM 9.5 10/12/2014 0914   CALCIUM 8.4 07/14/2014 0409   CALCIUM 8.7 11/09/2011 1345   ALKPHOS 65 10/12/2014 0914   ALKPHOS 60 07/13/2014 2023   ALKPHOS 52 11/09/2011 1345   AST 23 10/12/2014 0914   AST 24 07/13/2014 2023   AST 20 11/09/2011 1345   ALT 29 10/12/2014 0914   ALT 23 07/13/2014 2023   ALT 21 11/09/2011 1345   BILITOT 0.60 10/12/2014 0914   BILITOT 0.9 07/13/2014 2023   BILITOT 0.60 11/09/2011 1345       RADIOGRAPHIC STUDIES: Dg Chest 2 View  10/21/2014   CLINICAL DATA:  Malignant neoplasm of lung.  EXAM: CHEST  2 VIEW  COMPARISON:  10/14/2014  FINDINGS: Right pleural effusion unchanged. Right lower lobe airspace consolidation also unchanged.  Interval resolution of right apical pneumothorax. Right-sided chest tube has been removed in the interval.  Left lung remains clear.  Negative for heart failure.  IMPRESSION: Right pleural effusion and right lower lobe airspace consolidation is unchanged.  Resolution of right-sided pneumothorax.   Electronically Signed   By: Franchot Gallo M.D.   On: 10/21/2014 12:57   Dg Bone Density  10/18/2014   CLINICAL DATA:  79 year old postmenopausal Caucasian female. No personal fragility fracture history.  EXAM: DUAL X-RAY ABSORPTIOMETRY (DXA) FOR BONE MINERAL DENSITY  FINDINGS: Left FEMUR neck  Bone Mineral Density (BMD):  0.623 g/cm2  Young Adult T-Score: -2.0  Z-Score:  0.2  Left FOREARM (1/3 RADIUS)  Bone Mineral Density (BMD):  0.590  Young Adult T Score:  -1.7  Z Score:  1.1  ASSESSMENT: Patient's diagnostic category is low bone mass by WHO Criteria.  FRACTURE RISK: Increased  COMPARISON: The left hip bone mineral density has statistically significantly decreased when compared with baseline. However, there is no statistically significant change in bone mineral density when compared with previous study  dated 09/30/2012.  Effective therapies are available in the form of bisphosphonates, selective estrogen receptor modulators, biologic agents, and hormone replacement therapy (for women). All patients should ensure an adequate intake of dietary calcium (1200 mg daily) and vitamin D (800 IU daily) unless contraindicated.  All treatment decisions require clinical judgment and consideration of individual patient factors, including patient preferences, co-morbidities, previous drug  use, risk factors not captured in the FRAX model (e.g., frailty, falls, vitamin D deficiency, increased bone turnover, interval significant decline in bone density) and possible under- or over-estimation of fracture risk by FRAX.  The National Osteoporosis Foundation recommends that FDA-approved medical therapies be considered in postmenopausal women and men age 10 or older with a:  1. Hip or vertebral (clinical or morphometric) fracture.  2. T-score of -2.5 or lower at the spine or hip.  3. Ten-year fracture probability by FRAX of 3% or greater for hip fracture or 20% or greater for major osteoporotic fracture.  People with diagnosed cases of osteoporosis or at high risk for fracture should have regular bone mineral density tests. For patients eligible for Medicare, routine testing is allowed once every 2 years. The testing frequency can be increased to one year for patients who have rapidly progressing disease, those who are receiving or discontinuing medical therapy to restore bone mass, or have additional risk factors.  World Pharmacologist [facility]) Criteria:  Normal: T-scores from +1.0 to -1.0  Low Bone Mass (Osteopenia): T-scores between -1.0 and -2.5  Osteoporosis: T-scores -2.5 and below  Comparison to Reference Population:  T-score is the key measure used in the diagnosis of osteoporosis and relative risk determination for fracture. It provides a value for bone mass relative to the mean bone mass of a young adult reference  population expressed in terms of standard deviation (SD).  Z-score is the age-matched score showing the patient's values compared to a population matched for age, sex, and race. This is also expressed in terms of standard deviation. The patient may have values that compare favorably to the age-matched values and still be at increased risk for fracture.   Electronically Signed   By: Luberta [facility] M.D.   On: 10/18/2014 16:18    ASSESSMENT AND PLAN: This is a very pleasant 79 years old white female with history of stage IIIa non-small cell lung cancer status post concurrent chemoradiation and has been on treatment with Tarceva for the last 64 months with no significant evidence for disease progression.  She is tolerating her treatment well. The recent chest x-ray showed resolution of the right pneumothorax. I recommended for her to continue her current treatment with Tarceva 100 mg by mouth daily. She would come back for followup visit in one month for reevaluation with repeat blood work. She was advised to call immediately if she has any concerning symptoms in the interval.  The patient voices understanding of current disease status and treatment options and is in agreement with the current care plan.  All questions were answered. The patient knows to call the clinic with any problems, questions or concerns. We can certainly see the patient much sooner if necessary.  Disclaimer: This note was dictated with voice recognition software. Similar sounding words can inadvertently be transcribed and may not be corrected upon review.

## 2014-11-15 NOTE — Telephone Encounter (Signed)
Gave avs & cal for Dec. °

## 2014-11-24 ENCOUNTER — Other Ambulatory Visit: Payer: Self-pay | Admitting: Cardiothoracic Surgery

## 2014-11-24 DIAGNOSIS — J90 Pleural effusion, not elsewhere classified: Secondary | ICD-10-CM

## 2014-11-25 ENCOUNTER — Encounter: Payer: Self-pay | Admitting: Cardiothoracic Surgery

## 2014-11-25 ENCOUNTER — Ambulatory Visit (INDEPENDENT_AMBULATORY_CARE_PROVIDER_SITE_OTHER): Payer: Medicare Other | Admitting: Cardiothoracic Surgery

## 2014-11-25 ENCOUNTER — Ambulatory Visit
Admission: RE | Admit: 2014-11-25 | Discharge: 2014-11-25 | Disposition: A | Discharge status: Home / Self Care | Payer: Medicare Other | Source: Ambulatory Visit | Attending: Cardiothoracic Surgery | Admitting: Cardiothoracic Surgery

## 2014-11-25 ENCOUNTER — Other Ambulatory Visit: Payer: Self-pay | Admitting: *Deleted

## 2014-11-25 VITALS — BP 114/74 | HR 98 | Resp 16 | Ht 64.0 in

## 2014-11-25 DIAGNOSIS — J948 Other specified pleural conditions: Secondary | ICD-10-CM

## 2014-11-25 DIAGNOSIS — J910 Malignant pleural effusion: Secondary | ICD-10-CM

## 2014-11-25 DIAGNOSIS — J90 Pleural effusion, not elsewhere classified: Secondary | ICD-10-CM

## 2014-11-25 DIAGNOSIS — C3491 Malignant neoplasm of unspecified part of right bronchus or lung: Secondary | ICD-10-CM

## 2014-11-25 MED ORDER — ERLOTINIB HCL 100 MG PO TABS: 100.0000 mg | ORAL_TABLET | Freq: Every evening | ORAL | Status: DC

## 2014-11-25 NOTE — Telephone Encounter (Signed)
THIS REFILL REQUEST FOR TARCEVA WAS GIVEN TO DR.MOHAMED'S NURSE, Schuyler

## 2014-11-25 NOTE — Progress Notes (Signed)
Dana [name] [address]       [place] [address]             [phone]      HPI:  Patient presents today after  removal of her Pleur-x Catheter.  She has been followed  since placement in July. She had the Pleurx removed several  Weeks ago she  and returns today for follow-up chest x-ray She notes that she is remaining active act doing water aerobics.   Current Outpatient Prescriptions  Medication Sig Dispense Refill  . aspirin EC 81 MG tablet Take 81 mg by mouth daily.    . Calcium Carbonate-Vitamin D 600-400 MG-UNIT per chew tablet Chew 1 tablet by mouth 2 (two) times daily.    . cholecalciferol (VITAMIN D) 1000 UNITS tablet Take 1,000 Units by mouth daily.     . Coenzyme Q10 (COQ10) 100 MG CAPS Take 1 capsule by mouth daily.     Marland Kitchen conjugated estrogens (PREMARIN) vaginal cream Place 1 Applicatorful vaginally every Sunday.    . Cyanocobalamin (B-12) 1000 MCG CAPS Take 1,000 mcg by mouth 3 (three) times daily with meals.     . erlotinib (TARCEVA) 100 MG tablet Take 1 tablet (100 mg total) by mouth every evening. Take on an empty stomach 1 hour before meals or 2 hours after 30 tablet 1  . Hydrocodone-Acetaminophen (VICODIN) 5-300 MG TABS Take 1 tablet by mouth 4 (four) times daily as needed (for pain).     . hydroxychloroquine (PLAQUENIL) 200 MG tablet Take 400 mg by mouth every morning.     . loperamide (IMODIUM A-D) 2 MG tablet Take 1 mg by mouth 4 (four) times daily as needed for diarrhea or loose stools.     Marland Kitchen loratadine (CLARITIN) 10 MG tablet Take 10 mg by mouth daily as needed for allergies.     Marland Kitchen LORazepam (ATIVAN) 0.5 MG tablet Take 1 tablet (0.5 mg total) by mouth every 8 (eight) hours as needed for anxiety. 30 tablet 0  . oxyCODONE (OXY IR/ROXICODONE) 5 MG immediate release tablet Take 1 tablet (5 mg total) by mouth every 4 (four) hours as needed for moderate pain. 30 tablet 0  . Polyethyl Glycol-Propyl Glycol (SYSTANE OP) Place 1 drop into both eyes at bedtime as  needed (for dry eyes.).     Marland Kitchen Simethicone (GAS-X PO) Take 1 tablet by mouth 2 (two) times daily as needed (for gas).     Marland Kitchen tiotropium (SPIRIVA) 18 MCG inhalation capsule Place 18 mcg into inhaler and inhale every morning.      No current facility-administered medications for this visit.    Physical Exam:  BP 114/74 mmHg  Pulse 98  Resp 16  Ht 5\' 4"  (1.626 m)  SpO2 98%  Gen: no apparent distress Heart:  RRR Lungs: diminished breath sounds right base The Pleurx site is completely healed now the sutures removed.    Dg Chest 2 View  10/21/2014   CLINICAL DATA:  Malignant neoplasm of lung.  EXAM: CHEST  2 VIEW  COMPARISON:  10/14/2014  FINDINGS: Right pleural effusion unchanged. Right lower lobe airspace consolidation also unchanged.  Interval resolution of right apical pneumothorax. Right-sided chest tube has been removed in the interval.  Left lung remains clear.  Negative for heart failure.  IMPRESSION: Right pleural effusion and right lower lobe airspace consolidation is unchanged.  Resolution of right-sided pneumothorax.   Electronically Signed   By: Franchot Gallo M.D.   On: 10/21/2014  12:57   Dg Chest 2 View  10/14/2014   CLINICAL DATA:  Followup lung cancer, treated 2000 and with radiation chemotherapy, former smoker  EXAM: CHEST  2 VIEW  COMPARISON:  09/13/2014, 09/2014  FINDINGS: Unchanged right chest tube. Unchanged small right pneumothorax measuring 8 mm. Small a moderate right effusion, new from 09/13/2014 but similar to 09/2014. Bands of discoid atelectasis right lung are similar to 09/2014 as well.  The left lung is clear.  IMPRESSION: Findings of status post pleurodesis with small hydro pneumothorax on the right similar to CT scan performed 09/2014. Pleural effusion may be mildly larger.   Electronically Signed   By: Skipper Cliche M.D.   On: 10/14/2014 13:43   Ct Chest W Contrast  10/12/2014   CLINICAL DATA:  Lung cancer with chemotherapy and radiation in 2010. Weight  loss and shortness of breath. Recent talc pleurodesis. Right chest tube in place.  EXAM: CT CHEST WITH CONTRAST  TECHNIQUE: Multidetector CT imaging of the chest was performed during intravenous contrast administration.  CONTRAST:  74mL OMNIPAQUE IOHEXOL 300 MG/ML  SOLN  COMPARISON:  Chest x-ray 09/13/2014.  Chest CT 03/24/2014.  FINDINGS: Right pleural catheter is in place with the tip in the anterior right upper hemithorax. There is a right hydro pneumothorax. Decreasing pleural effusion component since prior CT. Pneumothorax components likely 10%.  Paramediastinal opacities are noted with air bronchograms, likely postradiation scarring. No suspicious pulmonary nodules or masses. Left lung is clear.  No mediastinal, hilar, or axillary adenopathy. Chest wall soft tissues are unremarkable. Imaging into the upper abdomen shows no acute findings.  No acute bony abnormality or focal bone lesion.  IMPRESSION: Right hydro pneumothorax post pleurodesis. Right chest tube is in place. Decreasing effusion component. Pneumothorax component approximately 10%.  Stable radiation fibrosis changes in the medial right lung.   Electronically Signed   By: Rolm Baptise M.D.   On: 10/12/2014 12:27   Plan:   Recurrent Malignant pleural effusion- drainage has stabilized after placement of Pleurx and subsequent talc pleurodesis.   The Pleurx has been removed, follow-up chest x-ray today  shows stable right chest without increasing effusion Plan to see her back as needed   Grace Isaac MD      [place] [place] [address] Office [phone]   Beeper [phone]

## 2014-11-25 NOTE — Addendum Note (Signed)
Addended by: Wyonia Hough on: 11/25/2014 02:52 PM   Modules accepted: Orders

## 2014-11-26 ENCOUNTER — Telehealth: Payer: Self-pay | Admitting: Medical Oncology

## 2014-11-26 NOTE — Telephone Encounter (Signed)
[facility] working on insurance details and made delivery arrangements with pt.

## 2014-11-29 NOTE — Telephone Encounter (Signed)
RECEIVED A FAX FROM [facility] CONCERNING A CONFIRMATION OF PRESCRIPTION SHIPMENT FOR [place] ON 11/26/14.

## 2014-12-13 ENCOUNTER — Telehealth: Payer: Self-pay | Admitting: Internal Medicine

## 2014-12-13 ENCOUNTER — Encounter: Payer: Self-pay | Admitting: Internal Medicine

## 2014-12-13 ENCOUNTER — Ambulatory Visit (HOSPITAL_BASED_OUTPATIENT_CLINIC_OR_DEPARTMENT_OTHER): Payer: Medicare Other | Admitting: Internal Medicine

## 2014-12-13 ENCOUNTER — Ambulatory Visit (HOSPITAL_BASED_OUTPATIENT_CLINIC_OR_DEPARTMENT_OTHER): Payer: Medicare Other | Admitting: Lab

## 2014-12-13 VITALS — BP 137/70 | HR 99 | Temp 98.1°F | Resp 18 | Ht 64.0 in | Wt 120.8 lb

## 2014-12-13 DIAGNOSIS — C3490 Malignant neoplasm of unspecified part of unspecified bronchus or lung: Secondary | ICD-10-CM

## 2014-12-13 DIAGNOSIS — C3491 Malignant neoplasm of unspecified part of right bronchus or lung: Secondary | ICD-10-CM

## 2014-12-13 LAB — COMPREHENSIVE METABOLIC PANEL (CC13)
ALK PHOS: 53 U/L (ref 40–150)
ALT: 26 U/L (ref 0–55)
AST: 28 U/L (ref 5–34)
Albumin: 3.1 g/dL — ABNORMAL LOW (ref 3.5–5.0)
Anion Gap: 10 mEq/L (ref 3–11)
BILIRUBIN TOTAL: 0.5 mg/dL (ref 0.20–1.20)
BUN: 26.4 mg/dL — ABNORMAL HIGH (ref 7.0–26.0)
CO2: 27 mEq/L (ref 22–29)
Calcium: 9.4 mg/dL (ref 8.4–10.4)
Chloride: 106 mEq/L (ref 98–109)
Creatinine: 0.9 mg/dL (ref 0.6–1.1)
EGFR: 59 mL/min/{1.73_m2} — ABNORMAL LOW (ref >90)
Glucose: 91 mg/dl (ref 70–140)
Potassium: 4.2 mEq/L (ref 3.5–5.1)
SODIUM: 143 meq/L (ref 136–145)
TOTAL PROTEIN: 5.9 g/dL — AB (ref 6.4–8.3)

## 2014-12-13 LAB — CBC WITH DIFFERENTIAL/PLATELET
BASO%: 0.3 % (ref 0.0–2.0)
Basophils Absolute: 0 10*3/uL (ref 0.0–0.1)
EOS%: 3.6 % (ref 0.0–7.0)
Eosinophils Absolute: 0.2 10*3/uL (ref 0.0–0.5)
HCT: 35.1 % (ref 34.8–46.6)
HGB: 11.2 g/dL — ABNORMAL LOW (ref 11.6–15.9)
LYMPH%: 5.9 % — AB (ref 14.0–49.7)
MCH: 29.4 pg (ref 25.1–34.0)
MCHC: 31.8 g/dL (ref 31.5–36.0)
MCV: 92.6 fL (ref 79.5–101.0)
MONO#: 0.5 10*3/uL (ref 0.1–0.9)
MONO%: 8.6 % (ref 0.0–14.0)
NEUT#: 4.3 10*3/uL (ref 1.5–6.5)
NEUT%: 81.6 % — ABNORMAL HIGH (ref 38.4–76.8)
PLATELETS: 296 10*3/uL (ref 145–400)
RBC: 3.79 10*6/uL (ref 3.70–5.45)
RDW: 14.4 % (ref 11.2–14.5)
WBC: 5.3 10*3/uL (ref 3.9–10.3)
lymph#: 0.3 10*3/uL — ABNORMAL LOW (ref 0.9–3.3)

## 2014-12-13 NOTE — Telephone Encounter (Signed)
gv adn printed appt sched and avs for pt for Jan 2016 °

## 2014-12-13 NOTE — Progress Notes (Signed)
[facility] Telephone:(336) [phone]   Fax:(336) [place], [place] [address]  DIAGNOSIS AND STAGE: : Stage IIIB (T2a, N3, M0) nonsmall cell lung cancer, adenocarcinoma, in a never smoker patient with positive EGFR mutation diagnosed in January 2010.   PRIOR THERAPY:  1) Status post concurrent chemoradiation with weekly carboplatin and paclitaxel; last dose given March 21, 2009.  2) Tarceva at 150 mg p.o. daily, status post approximately 48 months of treatment, discontinued secondary to persistent diarrhea.  3) status post right Pleurx catheter placement for right nonmalignant pleural effusion.  CURRENT THERAPY: Tarceva at 100 mg p.o. Daily, started 03/19/2013, status post 17 months of treatment.   CHEMOTHERAPY INTENT: Maintenance.  CURRENT # OF CHEMOTHERAPY CYCLES: 66 CURRENT ANTIEMETICS: Compazine  CURRENT SMOKING STATUS: Never smoker  ORAL CHEMOTHERAPY AND CONSENT: Tarceva  CURRENT BISPHOSPHONATES USE: None  PAIN MANAGEMENT: 5/10 low back pain and sciatica currently on naproxen.  NARCOTICS INDUCED CONSTIPATION: None  LIVING WILL AND CODE STATUS: Full code but no long-term resuscitation.   INTERVAL HISTORY: Dana [name] 79 y.o. female returns to the clinic today for followup visit. She is tolerating her current treatment with Tarceva fairly well with no significant adverse effects. She denied having any significant skin rash or diarrhea. She has no chest pain, cough or hemoptysis. Her weight has been stable. He has no nausea or vomiting. She has no nausea or vomiting, no fever or chills. She is here today for evaluation and repeat blood work.  MEDICAL HISTORY: Past Medical History  Diagnosis Date  . Hemorrhoid   . COPD (chronic obstructive pulmonary disease)   . GERD (gastroesophageal reflux disease)   . History of lung cancer ONCOLOGIST--  DR [facility]--  LAST CT ,  NO RECURRENCE OR METS    DX  JAN 2010 --  STAGE IIIA  NON-SMALL CELL ADENOCARCINOMA (RIGHT MIDDLE LOBE)---  S/P  CHEMORADIATIO THERAPY  (COMPLETE 03-21-2009)  . History of anal fissures   . Arthritis     HANDS,  WRISTS  . Osteoporosis   . Right wrist pain     MASS  . Dyspnea on exertion   . Normal cardiac stress test     2007  PER PT  . Wears glasses   . Rash, skin     RIGHT FOREARM/ HAND  . lung ca dx'd 2010    ALLERGIES:  is allergic to amoxicillin.  MEDICATIONS:  Current Outpatient Prescriptions  Medication Sig Dispense Refill  . aspirin EC 81 MG tablet Take 81 mg by mouth daily.    . Calcium Carbonate-Vitamin D 600-400 MG-UNIT per chew tablet Chew 1 tablet by mouth 2 (two) times daily.    . cholecalciferol (VITAMIN D) 1000 UNITS tablet Take 1,000 Units by mouth daily.     . Coenzyme Q10 (COQ10) 100 MG CAPS Take 1 capsule by mouth daily.     Marland Kitchen conjugated estrogens (PREMARIN) vaginal cream Place 1 Applicatorful vaginally every Sunday.    . Cyanocobalamin (B-12) 1000 MCG CAPS Take 1,000 mcg by mouth 3 (three) times daily with meals.     . erlotinib (TARCEVA) 100 MG tablet Take 1 tablet (100 mg total) by mouth every evening. Take on an empty stomach 1 hour before meals or 2 hours after 30 tablet 1  . Hydrocodone-Acetaminophen (VICODIN) 5-300 MG TABS Take 1 tablet by mouth 4 (four) times daily as needed (for pain).     . hydroxychloroquine (  PLAQUENIL) 200 MG tablet Take 400 mg by mouth every morning.     . loperamide (IMODIUM A-D) 2 MG tablet Take 1 mg by mouth 4 (four) times daily as needed for diarrhea or loose stools.     Marland Kitchen loratadine (CLARITIN) 10 MG tablet Take 10 mg by mouth daily as needed for allergies.     Marland Kitchen LORazepam (ATIVAN) 0.5 MG tablet Take 1 tablet (0.5 mg total) by mouth every 8 (eight) hours as needed for anxiety. 30 tablet 0  . oxyCODONE (OXY IR/ROXICODONE) 5 MG immediate release tablet Take 1 tablet (5 mg total) by mouth every 4 (four) hours as needed for moderate pain. 30 tablet 0  .  Polyethyl Glycol-Propyl Glycol (SYSTANE OP) Place 1 drop into both eyes at bedtime as needed (for dry eyes.).     Marland Kitchen Simethicone (GAS-X PO) Take 1 tablet by mouth 2 (two) times daily as needed (for gas).     Marland Kitchen tiotropium (SPIRIVA) 18 MCG inhalation capsule Place 18 mcg into inhaler and inhale every morning.      No current facility-administered medications for this visit.    SURGICAL HISTORY:  Past Surgical History  Procedure Laterality Date  . Anal fissure repair  07/09/2011    INTERNAL SPHINCTEROTOMY  . Knee arthroscopy Right 1999  . Thumb arthroscopy Left     - removed bone spur  . Thoracoscopy  01-26-2009    w/   lung/  node biopsy's  . Benign excision left breast central duct  02/1999  . Vault suspension plus cystocele repair with graft  06-13-2010  . Transthoracic echocardiogram  12-23-2008    NORMAL LV/  EF 65-70%/  MILD MR  &  TR  . Total abdominal hysterectomy w/ bilateral salpingoophorectomy  1982    W/  APPENDECTOMY  . Tonsillectomy  AS CHILD  . Excision right wrist mass  2012  . Mass excision Right 03/11/2014    Procedure: RIGHT WRIST DEEP MASS EXCISION WITH CULTURE AND BIOSPY;  Surgeon: Linna Hoff, MD;  Location: [facility];  Service: Orthopedics;  Laterality: Right;  . Chest tube insertion Right 07/14/2014    Procedure: INSERTION PLEURAL DRAINAGE CATHETER RIGHT CHEST;  Surgeon: Grace Isaac, MD;  Location: [facility];  Service: Thoracic;  Laterality: Right;  . Talc pleurodesis Right 09/16/2014    Procedure: TALC PLEURADESIS/ slurry;  Surgeon: Grace Isaac, MD;  Location: [facility];  Service: Thoracic;  Laterality: Right;  . Removal of pleural drainage catheter Right 10/14/2014    Procedure: REMOVAL OF PLEURAL DRAINAGE CATHETER;  Surgeon: Grace Isaac, MD;  Location: [facility];  Service: Thoracic;  Laterality: Right;    REVIEW OF SYSTEMS:  Constitutional: negative Eyes: negative Ears, nose, mouth, throat, and face: negative Respiratory: positive for  dyspnea on exertion Cardiovascular: negative Gastrointestinal: negative Genitourinary:negative Integument/breast: negative Hematologic/lymphatic: negative Musculoskeletal:negative Neurological: negative Behavioral/Psych: negative Endocrine: negative Allergic/Immunologic: negative   PHYSICAL EXAMINATION: General appearance: alert, cooperative and no distress Head: Normocephalic, without obvious abnormality, atraumatic Neck: no adenopathy, no JVD, supple, symmetrical, trachea midline and thyroid not enlarged, symmetric, no tenderness/mass/nodules Lymph nodes: Cervical, supraclavicular, and axillary nodes normal. Resp: diminished breath sounds RLL and dullness to percussion RLL Cardio: regular rate and rhythm, S1, S2 normal, no murmur, click, rub or gallop GI: soft, non-tender; bowel sounds normal; no masses,  no organomegaly Extremities: extremities normal, atraumatic, no cyanosis or edema Neurologic: Alert and oriented X 3, normal strength and tone. Normal symmetric reflexes. Normal coordination and gait  ECOG PERFORMANCE STATUS: 1 - Symptomatic but completely ambulatory  Blood pressure 137/70, pulse 99, temperature 98.1 F (36.7 C), temperature source Oral, resp. rate 18, height $RemoveBe'5\' 4"'RqnhHOeMI$  (1.626 m), weight 120 lb 12.8 oz (54.795 kg), SpO2 98 %.  LABORATORY DATA: Lab Results  Component Value Date   WBC 5.3 12/13/2014   HGB 11.2* 12/13/2014   HCT 35.1 12/13/2014   MCV 92.6 12/13/2014   PLT 296 12/13/2014      Chemistry      Component Value Date/Time   NA 144 11/15/2014 1315   NA 143 07/14/2014 0409   NA 145 11/09/2011 1345   K 4.2 11/15/2014 1315   K 4.1 07/14/2014 0409   K 3.9 11/09/2011 1345   CL 106 07/14/2014 0409   CL 108* 06/03/2013 0939   CL 104 11/09/2011 1345   CO2 27 11/15/2014 1315   CO2 25 07/14/2014 0409   CO2 28 11/09/2011 1345   BUN 20.3 11/15/2014 1315   BUN 12 07/14/2014 0409   BUN 20 11/09/2011 1345   CREATININE 0.9 11/15/2014 1315   CREATININE 0.83  07/14/2014 0409   CREATININE 0.6 11/09/2011 1345      Component Value Date/Time   CALCIUM 9.2 11/15/2014 1315   CALCIUM 8.4 07/14/2014 0409   CALCIUM 8.7 11/09/2011 1345   ALKPHOS 56 11/15/2014 1315   ALKPHOS 60 07/13/2014 2023   ALKPHOS 52 11/09/2011 1345   AST 26 11/15/2014 1315   AST 24 07/13/2014 2023   AST 20 11/09/2011 1345   ALT 22 11/15/2014 1315   ALT 23 07/13/2014 2023   ALT 21 11/09/2011 1345   BILITOT 0.47 11/15/2014 1315   BILITOT 0.9 07/13/2014 2023   BILITOT 0.60 11/09/2011 1345       RADIOGRAPHIC STUDIES: Dg Chest 2 View  11/25/2014   CLINICAL DATA:  Right pleural effusion, history of right lung carcinoma with surgery in 2010, talc pleurodesis in 2015 with drain removed on 10/14/2014  EXAM: CHEST  2 VIEW  COMPARISON:  Chest x-ray of 10/21/2014  FINDINGS: Pleural and parenchymal opacity at the right lung base is stable consistent with scarring and possibly loculated right pleural effusion. No pneumothorax is seen. The left lung is clear. Heart size is stable. Thoracolumbar scoliosis again is noted.  IMPRESSION: Stable pleural and parenchymal opacity at the right lung base consistent with scarring and possibly loculated effusion. No pneumothorax.   Electronically Signed   By: Ivar Drape M.D.   On: 11/25/2014 14:31    ASSESSMENT AND PLAN: This is a very pleasant 79 years old white female with history of stage IIIa non-small cell lung cancer status post concurrent chemoradiation and has been on treatment with Tarceva for the last 65 months with no significant evidence for disease progression.  She is tolerating her treatment well. I recommended for her to continue her current treatment with Tarceva 100 mg by mouth daily. She would come back for followup visit in one month for reevaluation with repeat blood work as well as repeat CT scan of the chest for restaging of her disease. She was advised to call immediately if she has any concerning symptoms in the interval.  The  patient voices understanding of current disease status and treatment options and is in agreement with the current care plan.  All questions were answered. The patient knows to call the clinic with any problems, questions or concerns. We can certainly see the patient much sooner if necessary.  Disclaimer: This note was dictated with voice  recognition software. Similar sounding words can inadvertently be transcribed and may not be corrected upon review.

## 2015-01-10 ENCOUNTER — Encounter (HOSPITAL_COMMUNITY): Payer: Self-pay

## 2015-01-10 ENCOUNTER — Ambulatory Visit (HOSPITAL_COMMUNITY)
Admission: RE | Admit: 2015-01-10 | Discharge: 2015-01-10 | Disposition: A | Discharge status: Home / Self Care | Payer: Medicare Other | Source: Ambulatory Visit | Attending: Internal Medicine | Admitting: Internal Medicine

## 2015-01-10 ENCOUNTER — Other Ambulatory Visit (HOSPITAL_BASED_OUTPATIENT_CLINIC_OR_DEPARTMENT_OTHER): Payer: Medicare Other

## 2015-01-10 DIAGNOSIS — C3491 Malignant neoplasm of unspecified part of right bronchus or lung: Secondary | ICD-10-CM

## 2015-01-10 DIAGNOSIS — Z85118 Personal history of other malignant neoplasm of bronchus and lung: Secondary | ICD-10-CM | POA: Diagnosis not present

## 2015-01-10 DIAGNOSIS — Z9221 Personal history of antineoplastic chemotherapy: Secondary | ICD-10-CM | POA: Diagnosis not present

## 2015-01-10 DIAGNOSIS — Z923 Personal history of irradiation: Secondary | ICD-10-CM | POA: Insufficient documentation

## 2015-01-10 DIAGNOSIS — C342 Malignant neoplasm of middle lobe, bronchus or lung: Secondary | ICD-10-CM

## 2015-01-10 LAB — CBC WITH DIFFERENTIAL/PLATELET
BASO%: 0.4 % (ref 0.0–2.0)
Basophils Absolute: 0 10*3/uL (ref 0.0–0.1)
EOS%: 5 % (ref 0.0–7.0)
Eosinophils Absolute: 0.3 10*3/uL (ref 0.0–0.5)
HCT: 35.6 % (ref 34.8–46.6)
HGB: 11.3 g/dL — ABNORMAL LOW (ref 11.6–15.9)
LYMPH%: 7.8 % — AB (ref 14.0–49.7)
MCH: 29 pg (ref 25.1–34.0)
MCHC: 31.9 g/dL (ref 31.5–36.0)
MCV: 90.8 fL (ref 79.5–101.0)
MONO#: 0.6 10*3/uL (ref 0.1–0.9)
MONO%: 11.3 % (ref 0.0–14.0)
NEUT#: 3.9 10*3/uL (ref 1.5–6.5)
NEUT%: 75.5 % (ref 38.4–76.8)
Platelets: 270 10*3/uL (ref 145–400)
RBC: 3.92 10*6/uL (ref 3.70–5.45)
RDW: 14.9 % — ABNORMAL HIGH (ref 11.2–14.5)
WBC: 5.2 10*3/uL (ref 3.9–10.3)
lymph#: 0.4 10*3/uL — ABNORMAL LOW (ref 0.9–3.3)

## 2015-01-10 LAB — COMPREHENSIVE METABOLIC PANEL (CC13)
ALBUMIN: 3.2 g/dL — AB (ref 3.5–5.0)
ALT: 22 U/L (ref 0–55)
ANION GAP: 9 meq/L (ref 3–11)
AST: 27 U/L (ref 5–34)
Alkaline Phosphatase: 54 U/L (ref 40–150)
BUN: 21.1 mg/dL (ref 7.0–26.0)
CALCIUM: 8.9 mg/dL (ref 8.4–10.4)
CHLORIDE: 106 meq/L (ref 98–109)
CO2: 27 mEq/L (ref 22–29)
Creatinine: 0.8 mg/dL (ref 0.6–1.1)
EGFR: 70 mL/min/{1.73_m2} — ABNORMAL LOW (ref >90)
Glucose: 95 mg/dl (ref 70–140)
Potassium: 3.8 mEq/L (ref 3.5–5.1)
Sodium: 142 mEq/L (ref 136–145)
Total Bilirubin: 0.55 mg/dL (ref 0.20–1.20)
Total Protein: 5.9 g/dL — ABNORMAL LOW (ref 6.4–8.3)

## 2015-01-10 MED ORDER — IOHEXOL 300 MG/ML  SOLN: 100.0000 mL | Freq: Once | INTRAMUSCULAR | Status: DC | PRN

## 2015-01-10 MED ORDER — IOHEXOL 300 MG/ML  SOLN
80.0000 mL | Freq: Once | INTRAMUSCULAR | Status: AC | PRN
  Administered 2015-01-10: 80 mL via INTRAVENOUS

## 2015-01-17 ENCOUNTER — Telehealth: Payer: Self-pay | Admitting: Internal Medicine

## 2015-01-17 ENCOUNTER — Ambulatory Visit (HOSPITAL_BASED_OUTPATIENT_CLINIC_OR_DEPARTMENT_OTHER): Payer: Medicare Other | Admitting: Internal Medicine

## 2015-01-17 ENCOUNTER — Encounter: Payer: Self-pay | Admitting: Internal Medicine

## 2015-01-17 VITALS — BP 134/83 | HR 91 | Temp 97.7°F | Resp 18 | Ht 64.0 in | Wt 118.6 lb

## 2015-01-17 DIAGNOSIS — C342 Malignant neoplasm of middle lobe, bronchus or lung: Secondary | ICD-10-CM

## 2015-01-17 DIAGNOSIS — B029 Zoster without complications: Secondary | ICD-10-CM

## 2015-01-17 DIAGNOSIS — C3491 Malignant neoplasm of unspecified part of right bronchus or lung: Secondary | ICD-10-CM

## 2015-01-17 DIAGNOSIS — M792 Neuralgia and neuritis, unspecified: Secondary | ICD-10-CM

## 2015-01-17 MED ORDER — GABAPENTIN 100 MG PO CAPS
100.0000 mg | ORAL_CAPSULE | Freq: Three times a day (TID) | ORAL | Status: DC
Start: 2015-01-17 — End: 2015-02-16

## 2015-01-17 NOTE — Progress Notes (Signed)
[place] Telephone:(336) [phone]   Fax:(336) [place], [place] [address]  DIAGNOSIS AND STAGE: : Stage IIIB (T2a, N3, M0) nonsmall cell lung cancer, adenocarcinoma, in a never smoker patient with positive EGFR mutation diagnosed in January 2010.   PRIOR THERAPY:  1) Status post concurrent chemoradiation with weekly carboplatin and paclitaxel; last dose given March 21, 2009.  2) Tarceva at 150 mg p.o. daily, status post approximately 48 months of treatment, discontinued secondary to persistent diarrhea.  3) status post right Pleurx catheter placement for right nonmalignant pleural effusion.  CURRENT THERAPY: Tarceva at 100 mg p.o. Daily, started 03/19/2013, status post 18 months of treatment.   CHEMOTHERAPY INTENT: Maintenance.  CURRENT # OF CHEMOTHERAPY CYCLES: 44 CURRENT ANTIEMETICS: Compazine  CURRENT SMOKING STATUS: Never smoker  ORAL CHEMOTHERAPY AND CONSENT: Tarceva  CURRENT BISPHOSPHONATES USE: None  PAIN MANAGEMENT: 5/10 low back pain and sciatica currently on naproxen.  NARCOTICS INDUCED CONSTIPATION: None  LIVING WILL AND CODE STATUS: Full code but no long-term resuscitation.   INTERVAL HISTORY: Dana [name] 80 y.o. female returns to the clinic today for followup visit. She is tolerating her current treatment with Tarceva fairly well with no significant adverse effects. She was recently diagnosed with shingles in her left lower extremity. She was seen by her primary care physician and treated with acyclovir for 7 days. She still have some rash and a lot of pain in the left lower extremity. She denied having any other significant skin rash or diarrhea. She has no chest pain, cough or hemoptysis. Her weight has been stable. He has no nausea or vomiting. She has no nausea or vomiting, no fever or chills. She had repeat CT scan of the chest performed recently and she is here for evaluation and  discussion of her scan results.  MEDICAL HISTORY: Past Medical History  Diagnosis Date  . Hemorrhoid   . COPD (chronic obstructive pulmonary disease)   . GERD (gastroesophageal reflux disease)   . History of lung cancer ONCOLOGIST--  DR [facility]--  LAST CT ,  NO RECURRENCE OR METS    DX JAN 2010 --  STAGE IIIA  NON-SMALL CELL ADENOCARCINOMA (RIGHT MIDDLE LOBE)---  S/P  CHEMORADIATIO THERAPY  (COMPLETE 03-21-2009)  . History of anal fissures   . Arthritis     HANDS,  WRISTS  . Osteoporosis   . Right wrist pain     MASS  . Dyspnea on exertion   . Normal cardiac stress test     2007  PER PT  . Wears glasses   . Rash, skin     RIGHT FOREARM/ HAND  . lung ca dx'd 2010    ALLERGIES:  is allergic to amoxicillin.  MEDICATIONS:  Current Outpatient Prescriptions  Medication Sig Dispense Refill  . aspirin EC 81 MG tablet Take 81 mg by mouth daily.    . Calcium Carbonate-Vitamin D 600-400 MG-UNIT per chew tablet Chew 1 tablet by mouth 2 (two) times daily.    . cholecalciferol (VITAMIN D) 1000 UNITS tablet Take 1,000 Units by mouth daily.     . Coenzyme Q10 (COQ10) 100 MG CAPS Take 1 capsule by mouth daily.     Marland Kitchen conjugated estrogens (PREMARIN) vaginal cream Place 1 Applicatorful vaginally every Sunday.    . Cyanocobalamin (B-12) 1000 MCG CAPS Take 1,000 mcg by mouth 3 (three) times daily with meals.     . erlotinib (TARCEVA) 100  MG tablet Take 1 tablet (100 mg total) by mouth every evening. Take on an empty stomach 1 hour before meals or 2 hours after 30 tablet 1  . hydroxychloroquine (PLAQUENIL) 200 MG tablet Take 400 mg by mouth every morning.     . loratadine (CLARITIN) 10 MG tablet Take 10 mg by mouth daily as needed for allergies.     Marland Kitchen LORazepam (ATIVAN) 0.5 MG tablet Take 1 tablet (0.5 mg total) by mouth every 8 (eight) hours as needed for anxiety. 30 tablet 0  . Polyethyl Glycol-Propyl Glycol (SYSTANE OP) Place 1 drop into both eyes at bedtime as needed (for dry eyes.).     Marland Kitchen  Simethicone (GAS-X PO) Take 1 tablet by mouth 2 (two) times daily as needed (for gas).     Marland Kitchen tiotropium (SPIRIVA) 18 MCG inhalation capsule Place 18 mcg into inhaler and inhale every morning.     Marland Kitchen Hydrocodone-Acetaminophen (VICODIN) 5-300 MG TABS Take 1 tablet by mouth 4 (four) times daily as needed (for pain).     Marland Kitchen loperamide (IMODIUM A-D) 2 MG tablet Take 1 mg by mouth 4 (four) times daily as needed for diarrhea or loose stools.     Marland Kitchen oxyCODONE (OXY IR/ROXICODONE) 5 MG immediate release tablet Take 1 tablet (5 mg total) by mouth every 4 (four) hours as needed for moderate pain. (Patient not taking: Reported on 01/17/2015) 30 tablet 0   No current facility-administered medications for this visit.    SURGICAL HISTORY:  Past Surgical History  Procedure Laterality Date  . Anal fissure repair  07/09/2011    INTERNAL SPHINCTEROTOMY  . Knee arthroscopy Right 1999  . Thumb arthroscopy Left     - removed bone spur  . Thoracoscopy  01-26-2009    w/   lung/  node biopsy's  . Benign excision left breast central duct  02/1999  . Vault suspension plus cystocele repair with graft  06-13-2010  . Transthoracic echocardiogram  12-23-2008    NORMAL LV/  EF 65-70%/  MILD MR  &  TR  . Total abdominal hysterectomy w/ bilateral salpingoophorectomy  1982    W/  APPENDECTOMY  . Tonsillectomy  AS CHILD  . Excision right wrist mass  2012  . Mass excision Right 03/11/2014    Procedure: RIGHT WRIST DEEP MASS EXCISION WITH CULTURE AND BIOSPY;  Surgeon: Linna Hoff, MD;  Location: [place];  Service: Orthopedics;  Laterality: Right;  . Chest tube insertion Right 07/14/2014    Procedure: INSERTION PLEURAL DRAINAGE CATHETER RIGHT CHEST;  Surgeon: Grace Isaac, MD;  Location: [place];  Service: Thoracic;  Laterality: Right;  . Talc pleurodesis Right 09/16/2014    Procedure: TALC PLEURADESIS/ slurry;  Surgeon: Grace Isaac, MD;  Location: [place];  Service: Thoracic;  Laterality: Right;  .  Removal of pleural drainage catheter Right 10/14/2014    Procedure: REMOVAL OF PLEURAL DRAINAGE CATHETER;  Surgeon: Grace Isaac, MD;  Location: [place];  Service: Thoracic;  Laterality: Right;    REVIEW OF SYSTEMS:  Constitutional: negative Eyes: negative Ears, nose, mouth, throat, and face: negative Respiratory: positive for dyspnea on exertion Cardiovascular: negative Gastrointestinal: negative Genitourinary:negative Integument/breast: negative Hematologic/lymphatic: negative Musculoskeletal:negative Neurological: negative Behavioral/Psych: negative Endocrine: negative Allergic/Immunologic: negative   PHYSICAL EXAMINATION: General appearance: alert, cooperative and no distress Head: Normocephalic, without obvious abnormality, atraumatic Neck: no adenopathy, no JVD, supple, symmetrical, trachea midline and thyroid not enlarged, symmetric, no tenderness/mass/nodules Lymph nodes: Cervical, supraclavicular, and axillary nodes normal. Resp:  clear to auscultation bilaterally Cardio: regular rate and rhythm, S1, S2 normal, no murmur, click, rub or gallop GI: soft, non-tender; bowel sounds normal; no masses,  no organomegaly Extremities: extremities normal, atraumatic, no cyanosis or edema and Papular rash on the inner side of the thigh and leg secondary to recent shingles Neurologic: Alert and oriented X 3, normal strength and tone. Normal symmetric reflexes. Normal coordination and gait  ECOG PERFORMANCE STATUS: 1 - Symptomatic but completely ambulatory  Blood pressure 134/83, pulse 91, temperature 97.7 F (36.5 C), temperature source Oral, resp. rate 18, height 5\' 4"  (1.626 m), weight 118 lb 9.6 oz (53.797 kg), SpO2 97 %.  LABORATORY DATA: Lab Results  Component Value Date   WBC 5.2 01/10/2015   HGB 11.3* 01/10/2015   HCT 35.6 01/10/2015   MCV 90.8 01/10/2015   PLT 270 01/10/2015      Chemistry      Component Value Date/Time   NA 142 01/10/2015 1337   NA 143  07/14/2014 0409   NA 145 11/09/2011 1345   K 3.8 01/10/2015 1337   K 4.1 07/14/2014 0409   K 3.9 11/09/2011 1345   CL 106 07/14/2014 0409   CL 108* 06/03/2013 0939   CL 104 11/09/2011 1345   CO2 27 01/10/2015 1337   CO2 25 07/14/2014 0409   CO2 28 11/09/2011 1345   BUN 21.1 01/10/2015 1337   BUN 12 07/14/2014 0409   BUN 20 11/09/2011 1345   CREATININE 0.8 01/10/2015 1337   CREATININE 0.83 07/14/2014 0409   CREATININE 0.6 11/09/2011 1345      Component Value Date/Time   CALCIUM 8.9 01/10/2015 1337   CALCIUM 8.4 07/14/2014 0409   CALCIUM 8.7 11/09/2011 1345   ALKPHOS 54 01/10/2015 1337   ALKPHOS 60 07/13/2014 2023   ALKPHOS 52 11/09/2011 1345   AST 27 01/10/2015 1337   AST 24 07/13/2014 2023   AST 20 11/09/2011 1345   ALT 22 01/10/2015 1337   ALT 23 07/13/2014 2023   ALT 21 11/09/2011 1345   BILITOT 0.55 01/10/2015 1337   BILITOT 0.9 07/13/2014 2023   BILITOT 0.60 11/09/2011 1345       RADIOGRAPHIC STUDIES: Ct Chest W Contrast  01/10/2015   CLINICAL DATA:  Lung cancer, chemotherapy and radiation therapy complete.  EXAM: CT CHEST WITH CONTRAST  TECHNIQUE: Multidetector CT imaging of the chest was performed during intravenous contrast administration.  CONTRAST:  50mL OMNIPAQUE IOHEXOL 300 MG/ML  SOLN  COMPARISON:  10/12/2014.  FINDINGS: Mediastinum/Nodes: No pathologically enlarged mediastinal, hilar or axillary lymph nodes. Atherosclerotic calcification of the arterial vasculature, including coronary arteries. Heart size normal. No pericardial effusion. The mid esophageal wall appears slightly thickened, which may be treatment related.  Lungs/Pleura: Radiation scarring and volume loss are seen in the medial aspect of the right hemi thorax. Small amount of loculated right pleural fluid, unchanged with minimal compressive atelectasis at the right lung base. High density material the base of the right hemi thorax is in keeping with talc pleurodesis. Scattered ground-glass densities  in the left lung measure up to 4 mm in the left upper lobe, unchanged. No left pleural fluid. Airway is otherwise unremarkable.  Upper abdomen: Visualized portions of the liver, gallbladder, adrenal glands, kidneys, spleen, pancreas, stomach and bowel are grossly unremarkable. No upper abdominal adenopathy.  Musculoskeletal: No worrisome lytic or sclerotic lesions. Degenerative changes are seen in the spine.  IMPRESSION: 1. Radiation scarring/volume loss and a small loculated pleural effusion in the right hemi thorax  without evidence of recurrent or metastatic disease. 2. Scattered ground-glass nodular densities in the left lung, stable. 3. Coronary artery calcification.   Electronically Signed   By: Lorin Picket M.D.   On: 01/10/2015 15:05    ASSESSMENT AND PLAN: This is a very pleasant 80 years old white female with history of stage IIIa non-small cell lung cancer status post concurrent chemoradiation and has been on treatment with Tarceva for the last 65 months with no significant evidence for disease progression.  She is tolerating her treatment well. The recent CT scan of the chest showed no evidence for disease progression. I discussed the scan results with the patient today. I recommended for her to continue her current treatment with Tarceva 100 mg by mouth daily. She would come back for followup visit in one month for reevaluation with repeat blood work. For the neuropathic pain from the shingles, I will start the patient on Neurontin 100 mg by mouth 3 times a day for the next 3 weeks. I also recommended for the patient to get a refill from her primary care physician for acyclovir for another week. She was advised to call immediately if she has any concerning symptoms in the interval.  The patient voices understanding of current disease status and treatment options and is in agreement with the current care plan.  All questions were answered. The patient knows to call the clinic with any  problems, questions or concerns. We can certainly see the patient much sooner if necessary.  Disclaimer: This note was dictated with voice recognition software. Similar sounding words can inadvertently be transcribed and may not be corrected upon review.

## 2015-01-17 NOTE — Telephone Encounter (Signed)
Gave avs & calendar for February. °

## 2015-01-24 ENCOUNTER — Other Ambulatory Visit: Payer: Self-pay | Admitting: Medical Oncology

## 2015-01-24 DIAGNOSIS — C3491 Malignant neoplasm of unspecified part of right bronchus or lung: Secondary | ICD-10-CM

## 2015-01-24 MED ORDER — ERLOTINIB HCL 100 MG PO TABS: 100.0000 mg | ORAL_TABLET | Freq: Every evening | ORAL | Status: DC

## 2015-02-16 ENCOUNTER — Encounter: Payer: Self-pay | Admitting: Internal Medicine

## 2015-02-16 ENCOUNTER — Other Ambulatory Visit (HOSPITAL_BASED_OUTPATIENT_CLINIC_OR_DEPARTMENT_OTHER): Payer: Medicare Other

## 2015-02-16 ENCOUNTER — Telehealth: Payer: Self-pay | Admitting: Internal Medicine

## 2015-02-16 ENCOUNTER — Ambulatory Visit (HOSPITAL_BASED_OUTPATIENT_CLINIC_OR_DEPARTMENT_OTHER): Payer: Medicare Other | Admitting: Internal Medicine

## 2015-02-16 VITALS — BP 125/63 | HR 90 | Temp 98.3°F | Resp 18 | Ht 64.0 in | Wt 120.2 lb

## 2015-02-16 DIAGNOSIS — C342 Malignant neoplasm of middle lobe, bronchus or lung: Secondary | ICD-10-CM

## 2015-02-16 DIAGNOSIS — R21 Rash and other nonspecific skin eruption: Secondary | ICD-10-CM

## 2015-02-16 DIAGNOSIS — C3491 Malignant neoplasm of unspecified part of right bronchus or lung: Secondary | ICD-10-CM

## 2015-02-16 LAB — CBC WITH DIFFERENTIAL/PLATELET
BASO%: 0.3 % (ref 0.0–2.0)
Basophils Absolute: 0 10*3/uL (ref 0.0–0.1)
EOS%: 4.7 % (ref 0.0–7.0)
Eosinophils Absolute: 0.2 10*3/uL (ref 0.0–0.5)
HCT: 34 % — ABNORMAL LOW (ref 34.8–46.6)
HGB: 10.9 g/dL — ABNORMAL LOW (ref 11.6–15.9)
LYMPH%: 5.7 % — AB (ref 14.0–49.7)
MCH: 29.4 pg (ref 25.1–34.0)
MCHC: 32.2 g/dL (ref 31.5–36.0)
MCV: 91.4 fL (ref 79.5–101.0)
MONO#: 0.5 10*3/uL (ref 0.1–0.9)
MONO%: 10.1 % (ref 0.0–14.0)
NEUT#: 4.1 10*3/uL (ref 1.5–6.5)
NEUT%: 79.2 % — ABNORMAL HIGH (ref 38.4–76.8)
Platelets: 301 10*3/uL (ref 145–400)
RBC: 3.72 10*6/uL (ref 3.70–5.45)
RDW: 15.4 % — ABNORMAL HIGH (ref 11.2–14.5)
WBC: 5.2 10*3/uL (ref 3.9–10.3)
lymph#: 0.3 10*3/uL — ABNORMAL LOW (ref 0.9–3.3)

## 2015-02-16 LAB — COMPREHENSIVE METABOLIC PANEL (CC13)
ALT: 20 U/L (ref 0–55)
ANION GAP: 9 meq/L (ref 3–11)
AST: 24 U/L (ref 5–34)
Albumin: 3 g/dL — ABNORMAL LOW (ref 3.5–5.0)
Alkaline Phosphatase: 53 U/L (ref 40–150)
BILIRUBIN TOTAL: 0.77 mg/dL (ref 0.20–1.20)
BUN: 20.9 mg/dL (ref 7.0–26.0)
CO2: 26 meq/L (ref 22–29)
CREATININE: 0.8 mg/dL (ref 0.6–1.1)
Calcium: 9.1 mg/dL (ref 8.4–10.4)
Chloride: 107 mEq/L (ref 98–109)
EGFR: 71 mL/min/{1.73_m2} — ABNORMAL LOW (ref >90)
Glucose: 89 mg/dl (ref 70–140)
Potassium: 3.9 mEq/L (ref 3.5–5.1)
SODIUM: 142 meq/L (ref 136–145)
TOTAL PROTEIN: 5.7 g/dL — AB (ref 6.4–8.3)

## 2015-02-16 MED ORDER — METHYLPREDNISOLONE (PAK) 4 MG PO TABS: ORAL_TABLET | ORAL | Status: DC

## 2015-02-16 NOTE — Progress Notes (Signed)
[place] [phone]   Fax:(336) [place], [place] [address]  DIAGNOSIS AND STAGE: : Stage IIIB (T2a, N3, M0) nonsmall cell lung cancer, adenocarcinoma, in a never smoker patient with positive EGFR mutation diagnosed in January 2010.   PRIOR THERAPY:  1) Status post concurrent chemoradiation with weekly carboplatin and paclitaxel; last dose given March 21, 2009.  2) Tarceva at 150 mg p.o. daily, status post approximately 48 months of treatment, discontinued secondary to persistent diarrhea.  3) status post right Pleurx catheter placement for right nonmalignant pleural effusion.  CURRENT THERAPY: Tarceva at 100 mg p.o. Daily, started 03/19/2013, status post 18 months of treatment.   CHEMOTHERAPY INTENT: Maintenance.  CURRENT # OF CHEMOTHERAPY CYCLES: 53 CURRENT ANTIEMETICS: Compazine  CURRENT SMOKING STATUS: Never smoker  ORAL CHEMOTHERAPY AND CONSENT: Tarceva  CURRENT BISPHOSPHONATES USE: None  PAIN MANAGEMENT: 5/10 low back pain and sciatica currently on naproxen.  NARCOTICS INDUCED CONSTIPATION: None  LIVING WILL AND CODE STATUS: Full code but no long-term resuscitation.   INTERVAL HISTORY: Dana [name] 80 y.o. female returns to the clinic today for followup visit. She is tolerating her current treatment with Tarceva fairly well with no significant adverse effects except for layer of skin rash especially on the face is started a week ago. The patient was seen by her dermatologist Dr. Ubaldo Glassing and started on hydrocortisone cream with no improvement. She did not have any change in her diet or received any new medications except for the previous treatment with acyclovir for the herpes zoster. She denied having any other significant areas skin rash or diarrhea. She has no chest pain, cough or hemoptysis. No significant weight loss or night sweats. He has no nausea or vomiting. She has no  nausea or vomiting, no fever or chills.   MEDICAL HISTORY: Past Medical History  Diagnosis Date  . Hemorrhoid   . COPD (chronic obstructive pulmonary disease)   . GERD (gastroesophageal reflux disease)   . History of lung cancer ONCOLOGIST--  DR [facility]--  LAST CT ,  NO RECURRENCE OR METS    DX JAN 2010 --  STAGE IIIA  NON-SMALL CELL ADENOCARCINOMA (RIGHT MIDDLE LOBE)---  S/P  CHEMORADIATIO THERAPY  (COMPLETE 03-21-2009)  . History of anal fissures   . Arthritis     HANDS,  WRISTS  . Osteoporosis   . Right wrist pain     MASS  . Dyspnea on exertion   . Normal cardiac stress test     2007  PER PT  . Wears glasses   . Rash, skin     RIGHT FOREARM/ HAND  . lung ca dx'd 2010    ALLERGIES:  is allergic to amoxicillin.  MEDICATIONS:  Current Outpatient Prescriptions  Medication Sig Dispense Refill  . aspirin EC 81 MG tablet Take 81 mg by mouth daily.    . Calcium Carbonate-Vitamin D 600-400 MG-UNIT per chew tablet Chew 1 tablet by mouth 2 (two) times daily.    . cholecalciferol (VITAMIN D) 1000 UNITS tablet Take 1,000 Units by mouth daily.     . Coenzyme Q10 (COQ10) 100 MG CAPS Take 1 capsule by mouth daily.     Marland Kitchen conjugated estrogens (PREMARIN) vaginal cream Place 1 Applicatorful vaginally every Sunday.    . Cyanocobalamin (B-12) 1000 MCG CAPS Take 1,000 mcg by mouth 3 (three) times daily with meals.     . erlotinib (TARCEVA) 100 MG tablet  Take 1 tablet (100 mg total) by mouth every evening. Take on an empty stomach 1 hour before meals or 2 hours after 30 tablet 1  . Hydrocodone-Acetaminophen (VICODIN) 5-300 MG TABS Take 1 tablet by mouth 4 (four) times daily as needed (for pain).     . hydroxychloroquine (PLAQUENIL) 200 MG tablet Take 400 mg by mouth every morning.     . loratadine (CLARITIN) 10 MG tablet Take 10 mg by mouth daily as needed for allergies.     Marland Kitchen LORazepam (ATIVAN) 0.5 MG tablet Take 1 tablet (0.5 mg total) by mouth every 8 (eight) hours as needed for anxiety. 30  tablet 0  . Polyethyl Glycol-Propyl Glycol (SYSTANE OP) Place 1 drop into both eyes at bedtime as needed (for dry eyes.).     Marland Kitchen Simethicone (GAS-X PO) Take 1 tablet by mouth 2 (two) times daily as needed (for gas).     Marland Kitchen tiotropium (SPIRIVA) 18 MCG inhalation capsule Place 18 mcg into inhaler and inhale every morning.     . loperamide (IMODIUM A-D) 2 MG tablet Take 1 mg by mouth 4 (four) times daily as needed for diarrhea or loose stools.     Marland Kitchen oxyCODONE (OXY IR/ROXICODONE) 5 MG immediate release tablet Take 1 tablet (5 mg total) by mouth every 4 (four) hours as needed for moderate pain. (Patient not taking: Reported on 01/17/2015) 30 tablet 0   No current facility-administered medications for this visit.    SURGICAL HISTORY:  Past Surgical History  Procedure Laterality Date  . Anal fissure repair  07/09/2011    INTERNAL SPHINCTEROTOMY  . Knee arthroscopy Right 1999  . Thumb arthroscopy Left     - removed bone spur  . Thoracoscopy  01-26-2009    w/   lung/  node biopsy's  . Benign excision left breast central duct  02/1999  . Vault suspension plus cystocele repair with graft  06-13-2010  . Transthoracic echocardiogram  12-23-2008    NORMAL LV/  EF 65-70%/  MILD MR  &  TR  . Total abdominal hysterectomy w/ bilateral salpingoophorectomy  1982    W/  APPENDECTOMY  . Tonsillectomy  AS CHILD  . Excision right wrist mass  2012  . Mass excision Right 03/11/2014    Procedure: RIGHT WRIST DEEP MASS EXCISION WITH CULTURE AND BIOSPY;  Surgeon: Linna Hoff, MD;  Location: [facility];  Service: Orthopedics;  Laterality: Right;  . Chest tube insertion Right 07/14/2014    Procedure: INSERTION PLEURAL DRAINAGE CATHETER RIGHT CHEST;  Surgeon: Grace Isaac, MD;  Location: [place];  Service: Thoracic;  Laterality: Right;  . Talc pleurodesis Right 09/16/2014    Procedure: TALC PLEURADESIS/ slurry;  Surgeon: Grace Isaac, MD;  Location: [place];  Service: Thoracic;  Laterality: Right;    . Removal of pleural drainage catheter Right 10/14/2014    Procedure: REMOVAL OF PLEURAL DRAINAGE CATHETER;  Surgeon: Grace Isaac, MD;  Location: [place];  Service: Thoracic;  Laterality: Right;    REVIEW OF SYSTEMS:  A comprehensive review of systems was negative except for: Integument/breast: positive for dryness, rash and Macular grade 2 skin rash on the face   PHYSICAL EXAMINATION: General appearance: alert, cooperative and no distress Head: Normocephalic, without obvious abnormality, atraumatic Neck: no adenopathy, no JVD, supple, symmetrical, trachea midline and thyroid not enlarged, symmetric, no tenderness/mass/nodules Lymph nodes: Cervical, supraclavicular, and axillary nodes normal. Resp: clear to auscultation bilaterally Cardio: regular rate and rhythm, S1, S2 normal, no  murmur, click, rub or gallop GI: soft, non-tender; bowel sounds normal; no masses,  no organomegaly Extremities: extremities normal, atraumatic, no cyanosis or edema and Papular rash on the inner side of the thigh and leg secondary to recent shingles Neurologic: Alert and oriented X 3, normal strength and tone. Normal symmetric reflexes. Normal coordination and gait  Skin exam: Grade 2 macular skin rash on the face.  ECOG PERFORMANCE STATUS: 1 - Symptomatic but completely ambulatory  Blood pressure 125/63, pulse 90, temperature 98.3 F (36.8 C), temperature source Oral, resp. rate 18, height _0  (1.626 m), weight 120 lb 3.2 oz (54.522 kg), SpO2 98 %.  LABORATORY DATA: Lab Results  Component Value Date   WBC 5.2 02/16/2015   HGB 10.9* 02/16/2015   HCT 34.0* 02/16/2015   MCV 91.4 02/16/2015   PLT 301 02/16/2015      Chemistry      Component Value Date/Time   NA 142 02/16/2015 0944   NA 143 07/14/2014 0409   NA 145 11/09/2011 1345   K 3.9 02/16/2015 0944   K 4.1 07/14/2014 0409   K 3.9 11/09/2011 1345   CL 106 07/14/2014 0409   CL 108* 06/03/2013 0939   CL 104 11/09/2011 1345   CO2 26  02/16/2015 0944   CO2 25 07/14/2014 0409   CO2 28 11/09/2011 1345   BUN 20.9 02/16/2015 0944   BUN 12 07/14/2014 0409   BUN 20 11/09/2011 1345   CREATININE 0.8 02/16/2015 0944   CREATININE 0.83 07/14/2014 0409   CREATININE 0.6 11/09/2011 1345      Component Value Date/Time   CALCIUM 9.1 02/16/2015 0944   CALCIUM 8.4 07/14/2014 0409   CALCIUM 8.7 11/09/2011 1345   ALKPHOS 53 02/16/2015 0944   ALKPHOS 60 07/13/2014 2023   ALKPHOS 52 11/09/2011 1345   AST 24 02/16/2015 0944   AST 24 07/13/2014 2023   AST 20 11/09/2011 1345   ALT 20 02/16/2015 0944   ALT 23 07/13/2014 2023   ALT 21 11/09/2011 1345   BILITOT 0.77 02/16/2015 0944   BILITOT 0.9 07/13/2014 2023   BILITOT 0.60 11/09/2011 1345       RADIOGRAPHIC STUDIES: No results found.  ASSESSMENT AND PLAN: This is a very pleasant 80 years old white female with history of stage IIIa non-small cell lung cancer status post concurrent chemoradiation and has been on treatment with Tarceva for the last 65 months with no significant evidence for disease progression.  She is tolerating her treatment well except for the flare of skin rash on the face is started 2 weeks ago. I recommended for the patient to hold her current treatment with Tarceva for 1 week until improvement of her skin rash. I will also start her on Medrol Dosepak. She will come back for follow-up visit in one month or sooner if there is no improvement in her skin rash. She was advised to call immediately if she has any concerning symptoms in the interval.  The patient voices understanding of current disease status and treatment options and is in agreement with the current care plan.  All questions were answered. The patient knows to call the clinic with any problems, questions or concerns. We can certainly see the patient much sooner if necessary.  Disclaimer: This note was dictated with voice recognition software. Similar sounding words can inadvertently be transcribed  and may not be corrected upon review.

## 2015-02-16 NOTE — Telephone Encounter (Signed)
Pt confirmed labs/ov per 02/24 POF, gave pt AVS.... KJ  °

## 2015-02-24 NOTE — Progress Notes (Signed)
[facility] sent facsimile confirmation of Tarceva prescription shipment.  tarceva was shipped on 02-23-2015 with next business day delivery.

## 2015-03-15 ENCOUNTER — Telehealth: Payer: Self-pay | Admitting: Internal Medicine

## 2015-03-15 ENCOUNTER — Encounter: Payer: Self-pay | Admitting: Internal Medicine

## 2015-03-15 ENCOUNTER — Other Ambulatory Visit (HOSPITAL_BASED_OUTPATIENT_CLINIC_OR_DEPARTMENT_OTHER): Payer: Medicare Other

## 2015-03-15 ENCOUNTER — Ambulatory Visit (HOSPITAL_BASED_OUTPATIENT_CLINIC_OR_DEPARTMENT_OTHER): Payer: Medicare Other | Admitting: Internal Medicine

## 2015-03-15 VITALS — BP 119/61 | HR 90 | Temp 98.5°F | Resp 18 | Ht 64.0 in | Wt 120.3 lb

## 2015-03-15 DIAGNOSIS — C3491 Malignant neoplasm of unspecified part of right bronchus or lung: Secondary | ICD-10-CM

## 2015-03-15 DIAGNOSIS — C342 Malignant neoplasm of middle lobe, bronchus or lung: Secondary | ICD-10-CM

## 2015-03-15 DIAGNOSIS — R21 Rash and other nonspecific skin eruption: Secondary | ICD-10-CM | POA: Diagnosis not present

## 2015-03-15 DIAGNOSIS — R5383 Other fatigue: Secondary | ICD-10-CM | POA: Diagnosis not present

## 2015-03-15 LAB — COMPREHENSIVE METABOLIC PANEL (CC13)
ALK PHOS: 55 U/L (ref 40–150)
ALT: 24 U/L (ref 0–55)
AST: 29 U/L (ref 5–34)
Albumin: 3.2 g/dL — ABNORMAL LOW (ref 3.5–5.0)
Anion Gap: 10 mEq/L (ref 3–11)
BILIRUBIN TOTAL: 0.91 mg/dL (ref 0.20–1.20)
BUN: 16.4 mg/dL (ref 7.0–26.0)
CHLORIDE: 107 meq/L (ref 98–109)
CO2: 26 mEq/L (ref 22–29)
CREATININE: 0.8 mg/dL (ref 0.6–1.1)
Calcium: 9.5 mg/dL (ref 8.4–10.4)
EGFR: 66 mL/min/{1.73_m2} — ABNORMAL LOW (ref >90)
Glucose: 81 mg/dl (ref 70–140)
Potassium: 4 mEq/L (ref 3.5–5.1)
Sodium: 143 mEq/L (ref 136–145)
Total Protein: 6 g/dL — ABNORMAL LOW (ref 6.4–8.3)

## 2015-03-15 LAB — CBC WITH DIFFERENTIAL/PLATELET
BASO%: 0.2 % (ref 0.0–2.0)
BASOS ABS: 0 10*3/uL (ref 0.0–0.1)
EOS%: 5.1 % (ref 0.0–7.0)
Eosinophils Absolute: 0.2 10*3/uL (ref 0.0–0.5)
HCT: 36.1 % (ref 34.8–46.6)
HGB: 11.9 g/dL (ref 11.6–15.9)
LYMPH#: 0.4 10*3/uL — AB (ref 0.9–3.3)
LYMPH%: 8.6 % — ABNORMAL LOW (ref 14.0–49.7)
MCH: 30.7 pg (ref 25.1–34.0)
MCHC: 33 g/dL (ref 31.5–36.0)
MCV: 93.3 fL (ref 79.5–101.0)
MONO#: 0.4 10*3/uL (ref 0.1–0.9)
MONO%: 8.6 % (ref 0.0–14.0)
NEUT#: 3.7 10*3/uL (ref 1.5–6.5)
NEUT%: 77.5 % — ABNORMAL HIGH (ref 38.4–76.8)
Platelets: 326 10*3/uL (ref 145–400)
RBC: 3.87 10*6/uL (ref 3.70–5.45)
RDW: 15.4 % — AB (ref 11.2–14.5)
WBC: 4.7 10*3/uL (ref 3.9–10.3)

## 2015-03-15 NOTE — Telephone Encounter (Signed)
gave and printed appt sched and avs for pt for April

## 2015-03-15 NOTE — Progress Notes (Signed)
[place] Telephone:([phone]) [phone]   Fax:([phone]) [place], [place] [address]  DIAGNOSIS AND STAGE: : Stage IIIB (T2a, N3, M0) nonsmall cell lung cancer, adenocarcinoma, in a never smoker patient with positive EGFR mutation diagnosed in January 2010.   PRIOR THERAPY:  1) Status post concurrent chemoradiation with weekly carboplatin and paclitaxel; last dose given March 21, 2009.  2) Tarceva at 150 mg p.o. daily, status post approximately 48 months of treatment, discontinued secondary to persistent diarrhea.  3) status post right Pleurx catheter placement for right nonmalignant pleural effusion.  CURRENT THERAPY: Tarceva at 100 mg p.o. Daily, started 03/19/2013, status post 19 months of treatment.   CHEMOTHERAPY INTENT: Maintenance.  CURRENT # OF CHEMOTHERAPY CYCLES: 68 CURRENT ANTIEMETICS: Compazine  CURRENT SMOKING STATUS: Never smoker  ORAL CHEMOTHERAPY AND CONSENT: Tarceva  CURRENT BISPHOSPHONATES USE: None  PAIN MANAGEMENT: 5/10 low back pain and sciatica currently on naproxen.  NARCOTICS INDUCED CONSTIPATION: None  LIVING WILL AND CODE STATUS: Full code but no long-term resuscitation.   INTERVAL HISTORY: Dana [name] 80 y.o. female returns to the clinic today for followup visit. She is tolerating her current treatment with Tarceva fairly well with no significant adverse effects except for mild skin rash and dry skin.  She also has mild fatigue. She denied having any significant diarrhea. She has no chest pain, cough or hemoptysis. No significant weight loss or night sweats. He has no nausea or vomiting. She has no nausea or vomiting, no fever or chills.   MEDICAL HISTORY: Past Medical History  Diagnosis Date  . Hemorrhoid   . COPD (chronic obstructive pulmonary disease)   . GERD (gastroesophageal reflux disease)   . History of lung cancer ONCOLOGIST--  DR [facility]--  LAST CT ,  NO  RECURRENCE OR METS    DX JAN 2010 --  STAGE IIIA  NON-SMALL CELL ADENOCARCINOMA (RIGHT MIDDLE LOBE)---  S/P  CHEMORADIATIO THERAPY  (COMPLETE 03-21-2009)  . History of anal fissures   . Arthritis     HANDS,  WRISTS  . Osteoporosis   . Right wrist pain     MASS  . Dyspnea on exertion   . Normal cardiac stress test     2007  PER PT  . Wears glasses   . Rash, skin     RIGHT FOREARM/ HAND  . lung ca dx'd 2010    ALLERGIES:  is allergic to amoxicillin.  MEDICATIONS:  Current Outpatient Prescriptions  Medication Sig Dispense Refill  . aspirin EC 81 MG tablet Take 81 mg by mouth daily.    . Calcium Carbonate-Vitamin D 600-400 MG-UNIT per chew tablet Chew 1 tablet by mouth 2 (two) times daily.    . cholecalciferol (VITAMIN D) 1000 UNITS tablet Take 1,000 Units by mouth daily.     . Coenzyme Q10 (COQ10) 100 MG CAPS Take 1 capsule by mouth daily.     Marland Kitchen conjugated estrogens (PREMARIN) vaginal cream Place 1 Applicatorful vaginally every Sunday.    . Cyanocobalamin (B-12) 1000 MCG CAPS Take 1,000 mcg by mouth 3 (three) times daily with meals.     . erlotinib (TARCEVA) 100 MG tablet Take 1 tablet (100 mg total) by mouth every evening. Take on an empty stomach 1 hour before meals or 2 hours after 30 tablet 1  . hydroxychloroquine (PLAQUENIL) 200 MG tablet Take 400 mg by mouth every morning.     . loratadine (CLARITIN) 10  MG tablet Take 10 mg by mouth daily as needed for allergies.     Marland Kitchen LORazepam (ATIVAN) 0.5 MG tablet Take 1 tablet (0.5 mg total) by mouth every 8 (eight) hours as needed for anxiety. 30 tablet 0  . Polyethyl Glycol-Propyl Glycol (SYSTANE OP) Place 1 drop into both eyes at bedtime as needed (for dry eyes.).     Marland Kitchen psyllium (METAMUCIL) 58.6 % powder Take 1 packet by mouth daily as needed.    . Simethicone (GAS-X PO) Take 1 tablet by mouth 2 (two) times daily as needed (for gas).     Marland Kitchen tiotropium (SPIRIVA) 18 MCG inhalation capsule Place 18 mcg into inhaler and inhale every morning.      Marland Kitchen Hydrocodone-Acetaminophen (VICODIN) 5-300 MG TABS Take 1 tablet by mouth 4 (four) times daily as needed (for pain).     Marland Kitchen loperamide (IMODIUM A-D) 2 MG tablet Take 1 mg by mouth 4 (four) times daily as needed for diarrhea or loose stools.     Marland Kitchen oxyCODONE (OXY IR/ROXICODONE) 5 MG immediate release tablet Take 1 tablet (5 mg total) by mouth every 4 (four) hours as needed for moderate pain. (Patient not taking: Reported on 01/17/2015) 30 tablet 0   No current facility-administered medications for this visit.    SURGICAL HISTORY:  Past Surgical History  Procedure Laterality Date  . Anal fissure repair  07/09/2011    INTERNAL SPHINCTEROTOMY  . Knee arthroscopy Right 1999  . Thumb arthroscopy Left     - removed bone spur  . Thoracoscopy  01-26-2009    w/   lung/  node biopsy's  . Benign excision left breast central duct  02/1999  . Vault suspension plus cystocele repair with graft  06-13-2010  . Transthoracic echocardiogram  12-23-2008    NORMAL LV/  EF 65-70%/  MILD MR  &  TR  . Total abdominal hysterectomy w/ bilateral salpingoophorectomy  1982    W/  APPENDECTOMY  . Tonsillectomy  AS CHILD  . Excision right wrist mass  2012  . Mass excision Right 03/11/2014    Procedure: RIGHT WRIST DEEP MASS EXCISION WITH CULTURE AND BIOSPY;  Surgeon: Linna Hoff, MD;  Location: [place];  Service: Orthopedics;  Laterality: Right;  . Chest tube insertion Right 07/14/2014    Procedure: INSERTION PLEURAL DRAINAGE CATHETER RIGHT CHEST;  Surgeon: Grace Isaac, MD;  Location: [place];  Service: Thoracic;  Laterality: Right;  . Talc pleurodesis Right 09/16/2014    Procedure: TALC PLEURADESIS/ slurry;  Surgeon: Grace Isaac, MD;  Location: [place];  Service: Thoracic;  Laterality: Right;  . Removal of pleural drainage catheter Right 10/14/2014    Procedure: REMOVAL OF PLEURAL DRAINAGE CATHETER;  Surgeon: Grace Isaac, MD;  Location: [place];  Service: Thoracic;  Laterality: Right;     REVIEW OF SYSTEMS:  A comprehensive review of systems was negative except for: Constitutional: positive for fatigue Integument/breast: positive for dryness and rash   PHYSICAL EXAMINATION: General appearance: alert, cooperative and no distress Head: Normocephalic, without obvious abnormality, atraumatic Neck: no adenopathy, no JVD, supple, symmetrical, trachea midline and thyroid not enlarged, symmetric, no tenderness/mass/nodules Lymph nodes: Cervical, supraclavicular, and axillary nodes normal. Resp: clear to auscultation bilaterally Cardio: regular rate and rhythm, S1, S2 normal, no murmur, click, rub or gallop GI: soft, non-tender; bowel sounds normal; no masses,  no organomegaly Extremities: extremities normal, atraumatic, no cyanosis or edema and Papular rash on the inner side of the thigh and leg  secondary to recent shingles Neurologic: Alert and oriented X 3, normal strength and tone. Normal symmetric reflexes. Normal coordination and gait  Skin exam: Grade 1 macular skin rash on the face.  ECOG PERFORMANCE STATUS: 1 - Symptomatic but completely ambulatory  Blood pressure 119/61, pulse 90, temperature 98.5 F (36.9 C), temperature source Oral, resp. rate 18, height $RemoveBe'5\' 4"'ETfLucehg$  (1.626 m), weight 120 lb 4.8 oz (54.568 kg), SpO2 100 %.  LABORATORY DATA: Lab Results  Component Value Date   WBC 4.7 03/15/2015   HGB 11.9 03/15/2015   HCT 36.1 03/15/2015   MCV 93.3 03/15/2015   PLT 326 03/15/2015      Chemistry      Component Value Date/Time   NA 143 03/15/2015 0921   NA 143 07/14/2014 0409   NA 145 11/09/2011 1345   K 4.0 03/15/2015 0921   K 4.1 07/14/2014 0409   K 3.9 11/09/2011 1345   CL 106 07/14/2014 0409   CL 108* 06/03/2013 0939   CL 104 11/09/2011 1345   CO2 26 03/15/2015 0921   CO2 25 07/14/2014 0409   CO2 28 11/09/2011 1345   BUN 16.4 03/15/2015 0921   BUN 12 07/14/2014 0409   BUN 20 11/09/2011 1345   CREATININE 0.8 03/15/2015 0921   CREATININE 0.83  07/14/2014 0409   CREATININE 0.6 11/09/2011 1345      Component Value Date/Time   CALCIUM 9.5 03/15/2015 0921   CALCIUM 8.4 07/14/2014 0409   CALCIUM 8.7 11/09/2011 1345   ALKPHOS 55 03/15/2015 0921   ALKPHOS 60 07/13/2014 2023   ALKPHOS 52 11/09/2011 1345   AST 29 03/15/2015 0921   AST 24 07/13/2014 2023   AST 20 11/09/2011 1345   ALT 24 03/15/2015 0921   ALT 23 07/13/2014 2023   ALT 21 11/09/2011 1345   BILITOT 0.91 03/15/2015 0921   BILITOT 0.9 07/13/2014 2023   BILITOT 0.60 11/09/2011 1345       RADIOGRAPHIC STUDIES: No results found.  ASSESSMENT AND PLAN: This is a very pleasant 80 years old white female with history of stage IIIa non-small cell lung cancer status post concurrent chemoradiation and has been on treatment with Tarceva for the last 67 months with no significant evidence for disease progression.  She is tolerating her treatment well except for mild skin rash and fatigue. Her blood work is unremarkable today. I recommended for the patient to continue her current treatment with Tarceva at the same dose. She will come back for follow-up visit in one month's for reevaluation after repeating CT scan of the chest. She was advised to call immediately if she has any concerning symptoms in the interval.  The patient voices understanding of current disease status and treatment options and is in agreement with the current care plan.  All questions were answered. The patient knows to call the clinic with any problems, questions or concerns. We can certainly see the patient much sooner if necessary.  Disclaimer: This note was dictated with voice recognition software. Similar sounding words can inadvertently be transcribed and may not be corrected upon review.

## 2015-03-19 IMAGING — CT CT CHEST W/ CM
2 of 3 series · 15 of 36 positions shown, 18 images · IV contrast (OMNIPAQUE)
Comparison: 10/12/2014.

CLINICAL DATA: Lung cancer, chemotherapy and radiation therapy
complete.

EXAM:
CT CHEST WITH CONTRAST
TECHNIQUE: Multidetector CT imaging of the chest was performed during
intravenous contrast administration.
CONTRAST:  80mL OMNIPAQUE IOHEXOL 300 MG/ML  SOLN

[Series 2: chest with st · axial · 0.61mm/px · z∈[-265,-15]mm · 12 of 60 slices shown, 15 images]
[im 5/60  mediastinal]
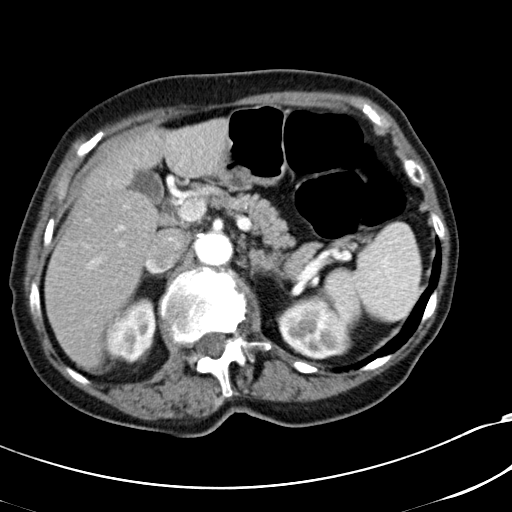
[im 5/60  lung]
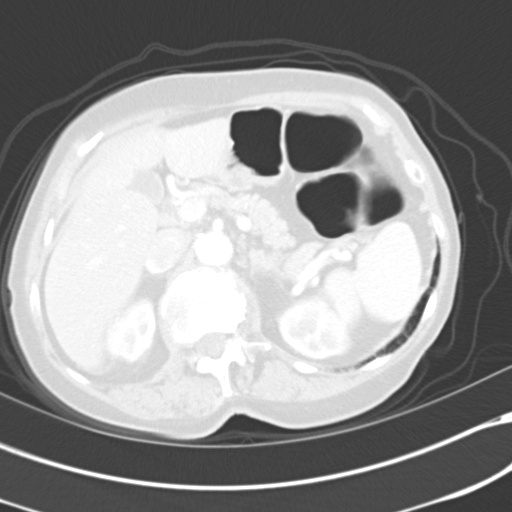
[im 9/60  lung]
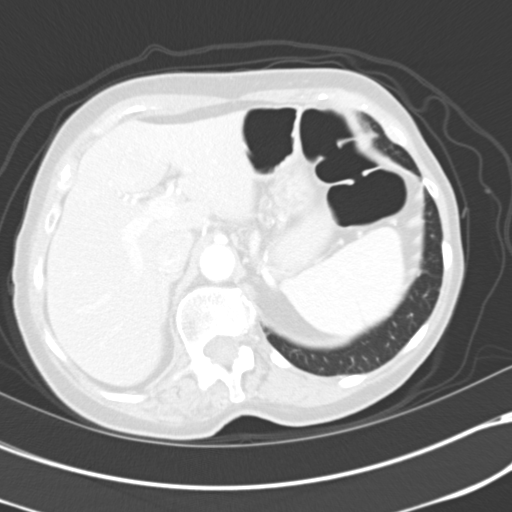
[im 14/60  lung]
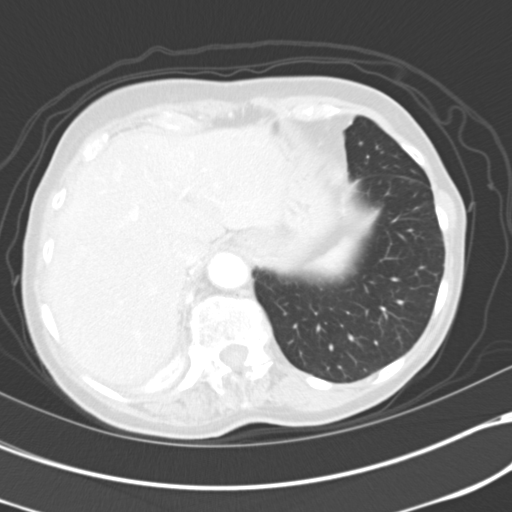
[im 18/60  lung]
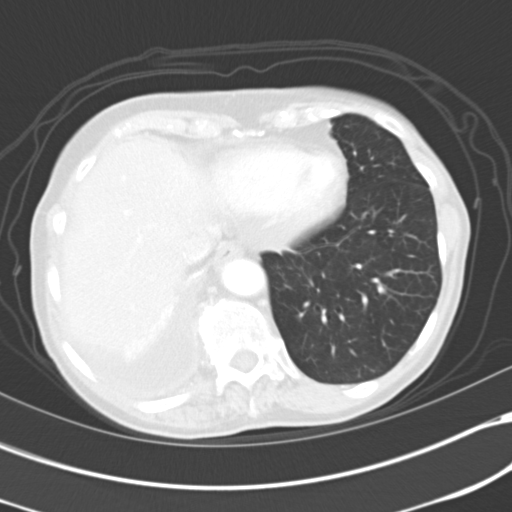
[im 22/60  mediastinal]
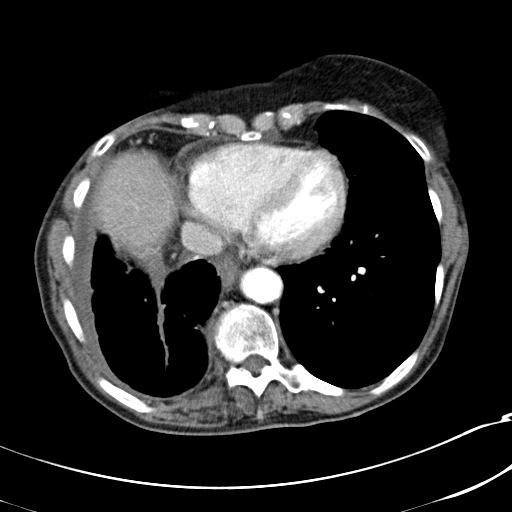
[im 22/60  lung]
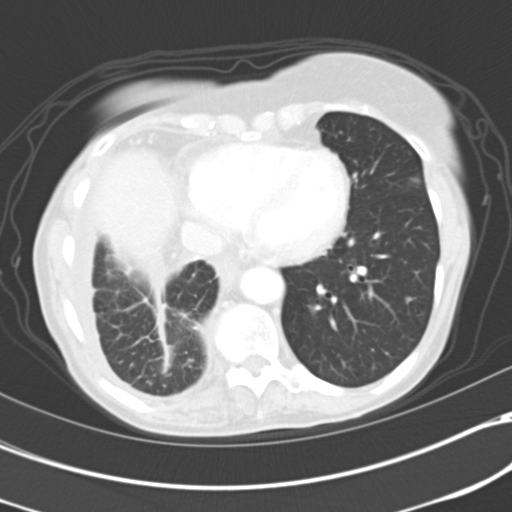
[im 27/60  lung]
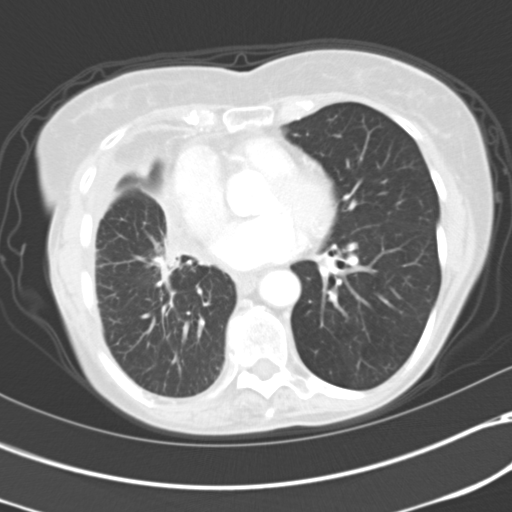
[im 33/60  lung]
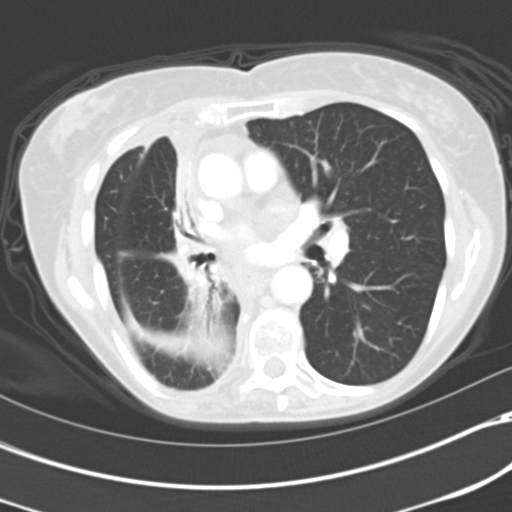
[im 38/60  lung]
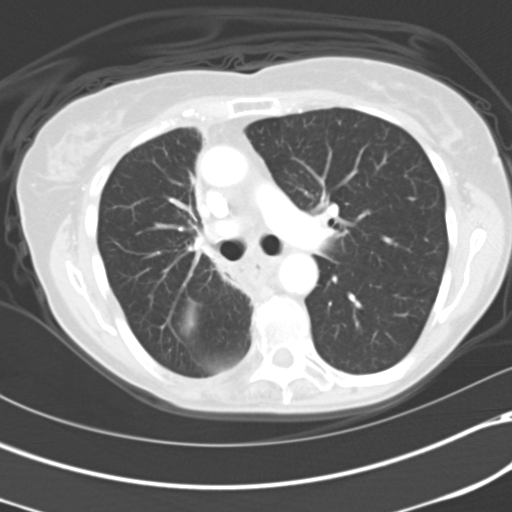
[im 42/60  mediastinal]
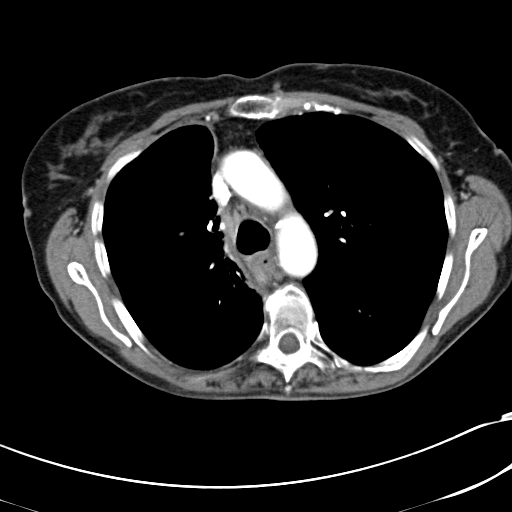
[im 42/60  lung]
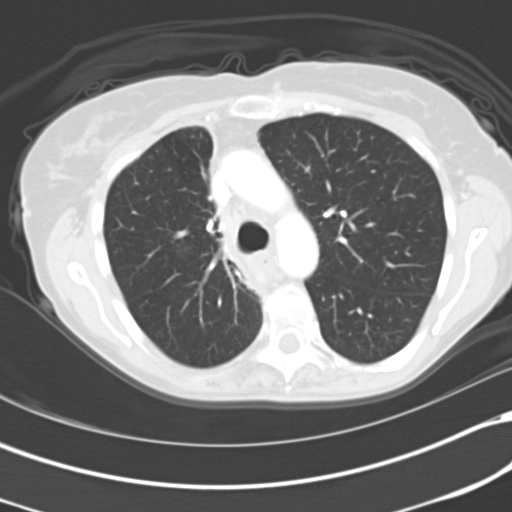
[im 46/60  lung]
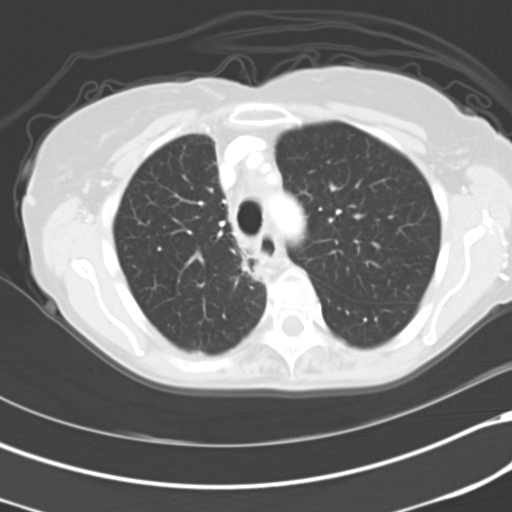
[im 51/60  lung]
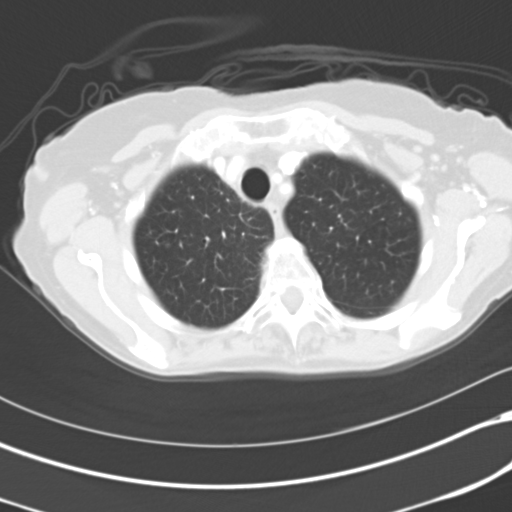
[im 55/60  lung]
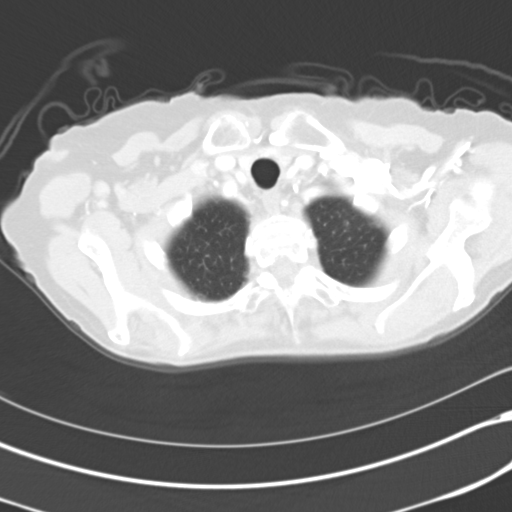

[Series 602: <mpr thick range> · coronal · 0.61mm/px · 3 of 84 slices shown]
[im 17/84  lung]
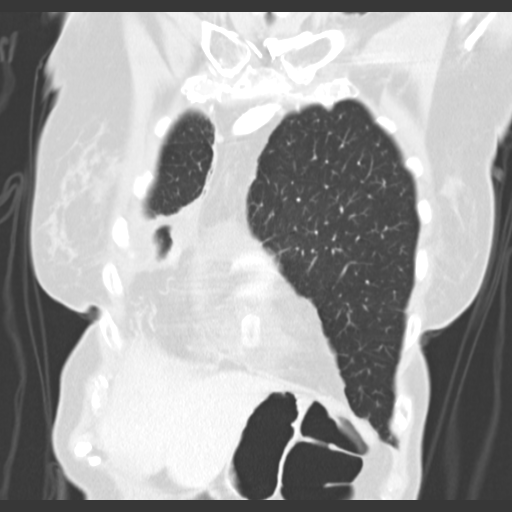
[im 34/84  lung]
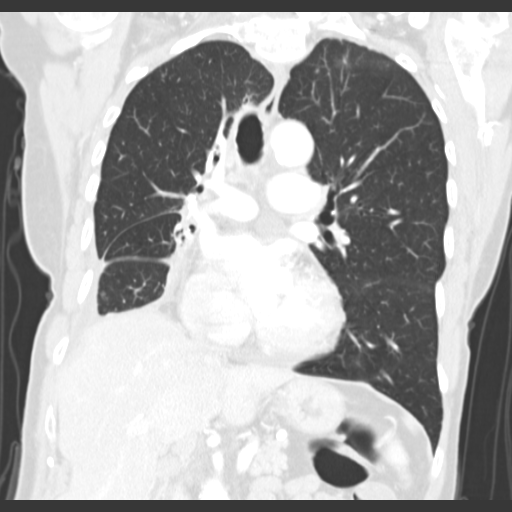
[im 50/84  lung]
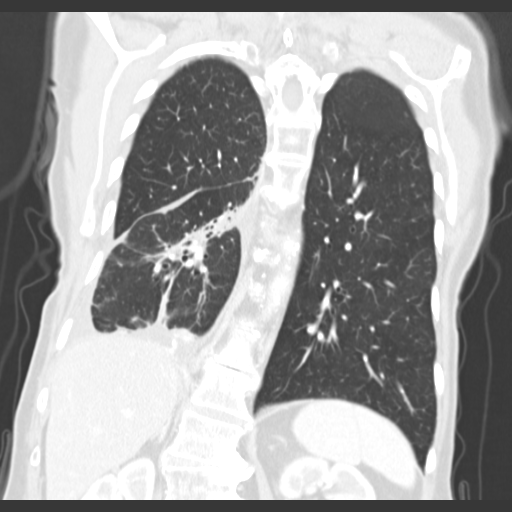

[15 of 36 positions shown; findings below may reference images not displayed]

FINDINGS: Mediastinum/Nodes: No pathologically enlarged mediastinal, hilar or
axillary lymph nodes. Atherosclerotic calcification of the arterial
vasculature, including coronary arteries. Heart size normal. No
pericardial effusion. The mid esophageal wall appears slightly
thickened, which may be treatment related.

Lungs/Pleura: Radiation scarring and volume loss are seen in the
medial aspect of the right hemi thorax. Small amount of loculated
right pleural fluid, unchanged with minimal compressive atelectasis
at the right lung base. High density material the base of the right
hemi thorax is in keeping with talc pleurodesis. Scattered
ground-glass densities in the left lung measure up to 4 mm in the
left upper lobe, unchanged. No left pleural fluid. Airway is
otherwise unremarkable.

Upper abdomen: Visualized portions of the liver, gallbladder,
adrenal glands, kidneys, spleen, pancreas, stomach and bowel are
grossly unremarkable. No upper abdominal adenopathy.

Musculoskeletal: No worrisome lytic or sclerotic lesions.
Degenerative changes are seen in the spine.
IMPRESSION: 1. Radiation scarring/volume loss and a small loculated pleural
effusion in the right hemi thorax without evidence of recurrent or
metastatic disease.
2. Scattered ground-glass nodular densities in the left lung,
stable.
3. Coronary artery calcification.

## 2015-03-22 ENCOUNTER — Other Ambulatory Visit: Payer: Self-pay | Admitting: Medical Oncology

## 2015-03-22 DIAGNOSIS — C3491 Malignant neoplasm of unspecified part of right bronchus or lung: Secondary | ICD-10-CM

## 2015-03-22 MED ORDER — ERLOTINIB HCL 100 MG PO TABS: 100.0000 mg | ORAL_TABLET | Freq: Every evening | ORAL | Status: DC

## 2015-03-22 NOTE — Progress Notes (Signed)
tarceva refilled -sent to [facility]

## 2015-04-12 ENCOUNTER — Other Ambulatory Visit: Payer: Medicare Other

## 2015-04-12 ENCOUNTER — Encounter (HOSPITAL_COMMUNITY): Payer: Self-pay

## 2015-04-12 ENCOUNTER — Other Ambulatory Visit (HOSPITAL_BASED_OUTPATIENT_CLINIC_OR_DEPARTMENT_OTHER): Payer: Medicare Other

## 2015-04-12 ENCOUNTER — Ambulatory Visit (HOSPITAL_COMMUNITY)
Admission: RE | Admit: 2015-04-12 | Discharge: 2015-04-12 | Disposition: A | Discharge status: Home / Self Care | Payer: Medicare Other | Source: Ambulatory Visit | Attending: Internal Medicine | Admitting: Internal Medicine

## 2015-04-12 DIAGNOSIS — C342 Malignant neoplasm of middle lobe, bronchus or lung: Secondary | ICD-10-CM | POA: Diagnosis not present

## 2015-04-12 DIAGNOSIS — C3491 Malignant neoplasm of unspecified part of right bronchus or lung: Secondary | ICD-10-CM | POA: Diagnosis present

## 2015-04-12 LAB — COMPREHENSIVE METABOLIC PANEL (CC13)
ALT: 23 U/L (ref 0–55)
AST: 28 U/L (ref 5–34)
Albumin: 3.2 g/dL — ABNORMAL LOW (ref 3.5–5.0)
Alkaline Phosphatase: 54 U/L (ref 40–150)
Anion Gap: 11 meq/L (ref 3–11)
BUN: 14.6 mg/dL (ref 7.0–26.0)
CO2: 26 meq/L (ref 22–29)
Calcium: 9 mg/dL (ref 8.4–10.4)
Chloride: 108 meq/L (ref 98–109)
Creatinine: 0.8 mg/dL (ref 0.6–1.1)
EGFR: 70 ml/min/1.73 m2 — ABNORMAL LOW
Glucose: 93 mg/dL (ref 70–140)
Potassium: 3.9 meq/L (ref 3.5–5.1)
Sodium: 145 meq/L (ref 136–145)
Total Bilirubin: 1.01 mg/dL (ref 0.20–1.20)
Total Protein: 5.7 g/dL — ABNORMAL LOW (ref 6.4–8.3)

## 2015-04-12 LAB — CBC WITH DIFFERENTIAL/PLATELET
BASO%: 0.4 % (ref 0.0–2.0)
Basophils Absolute: 0 10*3/uL (ref 0.0–0.1)
EOS ABS: 0.3 10*3/uL (ref 0.0–0.5)
EOS%: 6.5 % (ref 0.0–7.0)
HCT: 36.6 % (ref 34.8–46.6)
HGB: 12.1 g/dL (ref 11.6–15.9)
LYMPH%: 10.3 % — ABNORMAL LOW (ref 14.0–49.7)
MCH: 31 pg (ref 25.1–34.0)
MCHC: 33.1 g/dL (ref 31.5–36.0)
MCV: 93.8 fL (ref 79.5–101.0)
MONO#: 0.4 10*3/uL (ref 0.1–0.9)
MONO%: 8.3 % (ref 0.0–14.0)
NEUT%: 74.5 % (ref 38.4–76.8)
NEUTROS ABS: 3.7 10*3/uL (ref 1.5–6.5)
Platelets: 245 10*3/uL (ref 145–400)
RBC: 3.9 10*6/uL (ref 3.70–5.45)
RDW: 14.4 % (ref 11.2–14.5)
WBC: 4.9 10*3/uL (ref 3.9–10.3)
lymph#: 0.5 10*3/uL — ABNORMAL LOW (ref 0.9–3.3)

## 2015-04-12 MED ORDER — IOHEXOL 300 MG/ML  SOLN
80.0000 mL | Freq: Once | INTRAMUSCULAR | Status: AC | PRN
  Administered 2015-04-12: 80 mL via INTRAVENOUS

## 2015-04-19 ENCOUNTER — Telehealth: Payer: Self-pay | Admitting: Internal Medicine

## 2015-04-19 ENCOUNTER — Encounter: Payer: Self-pay | Admitting: Internal Medicine

## 2015-04-19 ENCOUNTER — Ambulatory Visit (HOSPITAL_BASED_OUTPATIENT_CLINIC_OR_DEPARTMENT_OTHER): Payer: Medicare Other | Admitting: Internal Medicine

## 2015-04-19 VITALS — BP 116/68 | HR 94 | Temp 97.6°F | Resp 18 | Ht 64.0 in | Wt 121.8 lb

## 2015-04-19 DIAGNOSIS — C3491 Malignant neoplasm of unspecified part of right bronchus or lung: Secondary | ICD-10-CM

## 2015-04-19 DIAGNOSIS — C342 Malignant neoplasm of middle lobe, bronchus or lung: Secondary | ICD-10-CM

## 2015-04-19 DIAGNOSIS — R21 Rash and other nonspecific skin eruption: Secondary | ICD-10-CM | POA: Diagnosis not present

## 2015-04-19 DIAGNOSIS — R5383 Other fatigue: Secondary | ICD-10-CM | POA: Diagnosis not present

## 2015-04-19 NOTE — Progress Notes (Signed)
[place] [phone]   Fax:(336) [place], MD [place] [address]  DIAGNOSIS AND STAGE: : Stage IIIB (T2a, N3, M0) nonsmall cell lung cancer, adenocarcinoma, in a never smoker patient with positive EGFR mutation diagnosed in January 2010.   PRIOR THERAPY:  1) Status post concurrent chemoradiation with weekly carboplatin and paclitaxel; last dose given March 21, 2009.  2) Tarceva at 150 mg p.o. daily, status post approximately 48 months of treatment, discontinued secondary to persistent diarrhea.  3) status post right Pleurx catheter placement for right nonmalignant pleural effusion.  CURRENT THERAPY: Tarceva at 100 mg p.o. Daily, started 03/19/2013, status post 20 months of treatment.   CHEMOTHERAPY INTENT: Maintenance.  CURRENT # OF CHEMOTHERAPY CYCLES: 69 CURRENT ANTIEMETICS: Compazine  CURRENT SMOKING STATUS: Never smoker  ORAL CHEMOTHERAPY AND CONSENT: Tarceva  CURRENT BISPHOSPHONATES USE: None  PAIN MANAGEMENT: 5/10 low back pain and sciatica currently on naproxen.  NARCOTICS INDUCED CONSTIPATION: None  LIVING WILL AND CODE STATUS: Full code but no long-term resuscitation.   INTERVAL HISTORY: Dana [name] 80 y.o. female returns to the clinic today for followup visit. She is tolerating her current treatment with Tarceva fairly well with no significant adverse effects except for mild skin rash and dry skin.  She also has mild fatigue. She still exercises on daily basis. She denied having any significant diarrhea. She has no chest pain, cough or hemoptysis. No significant weight loss or night sweats. He has no nausea or vomiting. She has no nausea or vomiting, no fever or chills. She had repeat CT scan of the chest performed recently and she is here for evaluation and discussion of her scan results.  MEDICAL HISTORY: Past Medical History  Diagnosis Date  . Hemorrhoid   . COPD  (chronic obstructive pulmonary disease)   . GERD (gastroesophageal reflux disease)   . History of lung cancer ONCOLOGIST--  DR [facility]--  LAST CT ,  NO RECURRENCE OR METS    DX JAN 2010 --  STAGE IIIA  NON-SMALL CELL ADENOCARCINOMA (RIGHT MIDDLE LOBE)---  S/P  CHEMORADIATIO THERAPY  (COMPLETE 03-21-2009)  . History of anal fissures   . Arthritis     HANDS,  WRISTS  . Osteoporosis   . Right wrist pain     MASS  . Dyspnea on exertion   . Normal cardiac stress test     2007  PER PT  . Wears glasses   . Rash, skin     RIGHT FOREARM/ HAND  . lung ca dx'd 2010    ALLERGIES:  is allergic to amoxicillin.  MEDICATIONS:  Current Outpatient Prescriptions  Medication Sig Dispense Refill  . aspirin EC 81 MG tablet Take 81 mg by mouth daily.    . Calcium Carbonate-Vitamin D 600-400 MG-UNIT per chew tablet Chew 1 tablet by mouth 2 (two) times daily.    . cholecalciferol (VITAMIN D) 1000 UNITS tablet Take 1,000 Units by mouth daily.     . Coenzyme Q10 (COQ10) 100 MG CAPS Take 1 capsule by mouth daily.     Marland Kitchen conjugated estrogens (PREMARIN) vaginal cream Place 1 Applicatorful vaginally every Sunday.    . Cyanocobalamin (B-12) 1000 MCG CAPS Take 1,000 mcg by mouth 3 (three) times daily with meals.     . erlotinib (TARCEVA) 100 MG tablet Take 1 tablet (100 mg total) by mouth every evening. Take on an empty stomach 1 hour before meals or 2  hours after 30 tablet 1  . Hydrocodone-Acetaminophen (VICODIN) 5-300 MG TABS Take 1 tablet by mouth 4 (four) times daily as needed (for pain).     . hydroxychloroquine (PLAQUENIL) 200 MG tablet Take 400 mg by mouth every morning.     . loperamide (IMODIUM A-D) 2 MG tablet Take 1 mg by mouth 4 (four) times daily as needed for diarrhea or loose stools.     Marland Kitchen loratadine (CLARITIN) 10 MG tablet Take 10 mg by mouth daily as needed for allergies.     Marland Kitchen LORazepam (ATIVAN) 0.5 MG tablet Take 1 tablet (0.5 mg total) by mouth every 8 (eight) hours as needed for anxiety. 30  tablet 0  . oxyCODONE (OXY IR/ROXICODONE) 5 MG immediate release tablet Take 1 tablet (5 mg total) by mouth every 4 (four) hours as needed for moderate pain. (Patient not taking: Reported on 01/17/2015) 30 tablet 0  . Polyethyl Glycol-Propyl Glycol (SYSTANE OP) Place 1 drop into both eyes at bedtime as needed (for dry eyes.).     Marland Kitchen psyllium (METAMUCIL) 58.6 % powder Take 1 packet by mouth daily as needed.    . Simethicone (GAS-X PO) Take 1 tablet by mouth 2 (two) times daily as needed (for gas).     Marland Kitchen tiotropium (SPIRIVA) 18 MCG inhalation capsule Place 18 mcg into inhaler and inhale every morning.      No current facility-administered medications for this visit.    SURGICAL HISTORY:  Past Surgical History  Procedure Laterality Date  . Anal fissure repair  07/09/2011    INTERNAL SPHINCTEROTOMY  . Knee arthroscopy Right 1999  . Thumb arthroscopy Left     - removed bone spur  . Thoracoscopy  01-26-2009    w/   lung/  node biopsy's  . Benign excision left breast central duct  02/1999  . Vault suspension plus cystocele repair with graft  06-13-2010  . Transthoracic echocardiogram  12-23-2008    NORMAL LV/  EF 65-70%/  MILD MR  &  TR  . Total abdominal hysterectomy w/ bilateral salpingoophorectomy  1982    W/  APPENDECTOMY  . Tonsillectomy  AS CHILD  . Excision right wrist mass  2012  . Mass excision Right 03/11/2014    Procedure: RIGHT WRIST DEEP MASS EXCISION WITH CULTURE AND BIOSPY;  Surgeon: Linna Hoff, MD;  Location: [place];  Service: Orthopedics;  Laterality: Right;  . Chest tube insertion Right 07/14/2014    Procedure: INSERTION PLEURAL DRAINAGE CATHETER RIGHT CHEST;  Surgeon: Grace Isaac, MD;  Location: [place];  Service: Thoracic;  Laterality: Right;  . Talc pleurodesis Right 09/16/2014    Procedure: TALC PLEURADESIS/ slurry;  Surgeon: Grace Isaac, MD;  Location: [place];  Service: Thoracic;  Laterality: Right;  . Removal of pleural drainage catheter  Right 10/14/2014    Procedure: REMOVAL OF PLEURAL DRAINAGE CATHETER;  Surgeon: Grace Isaac, MD;  Location: [place];  Service: Thoracic;  Laterality: Right;    REVIEW OF SYSTEMS:  Constitutional: positive for fatigue Eyes: negative Ears, nose, mouth, throat, and face: negative Respiratory: negative Cardiovascular: negative Gastrointestinal: negative Genitourinary:negative Integument/breast: negative Hematologic/lymphatic: negative Musculoskeletal:negative Neurological: negative Behavioral/Psych: negative Endocrine: negative Allergic/Immunologic: negative   PHYSICAL EXAMINATION: General appearance: alert, cooperative and no distress Head: Normocephalic, without obvious abnormality, atraumatic Neck: no adenopathy, no JVD, supple, symmetrical, trachea midline and thyroid not enlarged, symmetric, no tenderness/mass/nodules Lymph nodes: Cervical, supraclavicular, and axillary nodes normal. Resp: clear to auscultation bilaterally Cardio: regular rate and  rhythm, S1, S2 normal, no murmur, click, rub or gallop GI: soft, non-tender; bowel sounds normal; no masses,  no organomegaly Extremities: extremities normal, atraumatic, no cyanosis or edema and Papular rash on the inner side of the thigh and leg secondary to recent shingles Neurologic: Alert and oriented X 3, normal strength and tone. Normal symmetric reflexes. Normal coordination and gait  Skin exam: Grade 1 macular skin rash on the face.  ECOG PERFORMANCE STATUS: 1 - Symptomatic but completely ambulatory  Blood pressure 116/68, pulse 94, temperature 97.6 F (36.4 C), temperature source Oral, resp. rate 18, height $RemoveBe'5\' 4"'[id number]$  (1.626 m), weight 121 lb 12.8 oz (55.248 kg), SpO2 97 %.  LABORATORY DATA: Lab Results  Component Value Date   WBC 4.9 04/12/2015   HGB 12.1 04/12/2015   HCT 36.6 04/12/2015   MCV 93.8 04/12/2015   PLT 245 04/12/2015      Chemistry      Component Value Date/Time   NA 145 04/12/2015 0804   NA 143  07/14/2014 0409   NA 145 11/09/2011 1345   K 3.9 04/12/2015 0804   K 4.1 07/14/2014 0409   K 3.9 11/09/2011 1345   CL 106 07/14/2014 0409   CL 108* 06/03/2013 0939   CL 104 11/09/2011 1345   CO2 26 04/12/2015 0804   CO2 25 07/14/2014 0409   CO2 28 11/09/2011 1345   BUN 14.6 04/12/2015 0804   BUN 12 07/14/2014 0409   BUN 20 11/09/2011 1345   CREATININE 0.8 04/12/2015 0804   CREATININE 0.83 07/14/2014 0409   CREATININE 0.6 11/09/2011 1345      Component Value Date/Time   CALCIUM 9.0 04/12/2015 0804   CALCIUM 8.4 07/14/2014 0409   CALCIUM 8.7 11/09/2011 1345   ALKPHOS 54 04/12/2015 0804   ALKPHOS 60 07/13/2014 2023   ALKPHOS 52 11/09/2011 1345   AST 28 04/12/2015 0804   AST 24 07/13/2014 2023   AST 20 11/09/2011 1345   ALT 23 04/12/2015 0804   ALT 23 07/13/2014 2023   ALT 21 11/09/2011 1345   BILITOT 1.01 04/12/2015 0804   BILITOT 0.9 07/13/2014 2023   BILITOT 0.60 11/09/2011 1345       RADIOGRAPHIC STUDIES: Ct Chest W Contrast  04/12/2015   CLINICAL DATA:  80 year old female with history of lung cancer diagnosed in 2010 status post chemotherapy and radiation therapy, now complete. Restaging examination. Currently asymptomatic.  EXAM: CT CHEST WITH CONTRAST  TECHNIQUE: Multidetector CT imaging of the chest was performed during intravenous contrast administration.  CONTRAST:  40mL OMNIPAQUE IOHEXOL 300 MG/ML  SOLN  COMPARISON:  Chest CT 01/10/2015.  FINDINGS: Mediastinum/Lymph Nodes: Heart size is normal. There is no significant pericardial fluid, thickening or pericardial calcification. Multiple borderline enlarged mediastinal lymph nodes measuring up to 9 mm in short axis in the subcarinal nodal station. Esophagus is unremarkable in appearance. No axillary lymphadenopathy.  Lungs/Pleura: Interval development of some loculated pleural fluid in the minor fissure. Other small volume of loculated pleural fluid in the right hemithorax is otherwise similar to the prior examination.  High density posteriorly in the right pleural space, similar to prior examinations, related to prior talc pleurodesis. Extensive perihilar architectural distortion in the right lung, similar to numerous prior examinations, most compatible with chronic post radiation changes. Septal thickening in the basal segments of the right lower lobe also similar to prior examinations. No definite suspicious appearing pulmonary nodules or masses. No acute consolidative airspace disease.  Upper Abdomen: Small amount of high attenuation material lying  dependently in the gallbladder, compatible with tiny gallstones.  Musculoskeletal/Soft Tissues: There are no aggressive appearing lytic or blastic lesions noted in the visualized portions of the skeleton.  IMPRESSION: 1. Chronic post treatment related changes of prior radiation therapy and talc pleurodesis in the right hemithorax, without definitive findings to suggest local recurrence of disease. 2. Persistent loculated small right-sided pleural effusion is generally similar to the prior study, although there is a new loculated pleural fluid in the minor fissure. 3. Cholelithiasis without evidence of acute cholecystitis at this time. 4. Additional incidental findings, as above.   Electronically Signed   By: Vinnie Langton M.D.   On: 04/12/2015 10:25    ASSESSMENT AND PLAN: This is a very pleasant 80 years old white female with history of stage IIIa non-small cell lung cancer status post concurrent chemoradiation and has been on treatment with Tarceva for the last 68 months with no significant evidence for disease progression.  She is tolerating her treatment well except for mild skin rash and fatigue. CT scan of the chest showed no evidence for disease progression. I discussed the scan results with the patient today.  I recommended for the patient to continue her current treatment with Tarceva at the same dose. She will come back for follow-up visit in 6 weeks with repeat  blood work. She was advised to call immediately if she has any concerning symptoms in the interval.  The patient voices understanding of current disease status and treatment options and is in agreement with the current care plan.  All questions were answered. The patient knows to call the clinic with any problems, questions or concerns. We can certainly see the patient much sooner if necessary.  Disclaimer: This note was dictated with voice recognition software. Similar sounding words can inadvertently be transcribed and may not be corrected upon review.

## 2015-04-19 NOTE — Telephone Encounter (Signed)
Gave patient avs report and appointments for June.  °

## 2015-04-26 ENCOUNTER — Telehealth: Payer: Self-pay | Admitting: *Deleted

## 2015-04-26 NOTE — Telephone Encounter (Signed)
Fax from [facility] shipped 04/25/15

## 2015-05-09 ENCOUNTER — Other Ambulatory Visit: Payer: Self-pay

## 2015-05-09 DIAGNOSIS — Z1231 Encounter for screening mammogram for malignant neoplasm of breast: Secondary | ICD-10-CM

## 2015-05-12 ENCOUNTER — Ambulatory Visit
Admission: RE | Admit: 2015-05-12 | Discharge: 2015-05-12 | Disposition: A | Discharge status: Home / Self Care | Payer: Medicare Other | Source: Ambulatory Visit

## 2015-05-12 DIAGNOSIS — Z1231 Encounter for screening mammogram for malignant neoplasm of breast: Secondary | ICD-10-CM

## 2015-05-26 ENCOUNTER — Other Ambulatory Visit: Payer: Self-pay | Admitting: Medical Oncology

## 2015-05-26 DIAGNOSIS — C3491 Malignant neoplasm of unspecified part of right bronchus or lung: Secondary | ICD-10-CM

## 2015-05-26 MED ORDER — ERLOTINIB HCL 100 MG PO TABS: 100.0000 mg | ORAL_TABLET | Freq: Every evening | ORAL | Status: DC

## 2015-06-02 ENCOUNTER — Ambulatory Visit (HOSPITAL_BASED_OUTPATIENT_CLINIC_OR_DEPARTMENT_OTHER): Payer: Medicare Other | Admitting: Internal Medicine

## 2015-06-02 ENCOUNTER — Encounter: Payer: Self-pay | Admitting: Internal Medicine

## 2015-06-02 ENCOUNTER — Other Ambulatory Visit (HOSPITAL_BASED_OUTPATIENT_CLINIC_OR_DEPARTMENT_OTHER): Payer: Medicare Other

## 2015-06-02 ENCOUNTER — Telehealth: Payer: Self-pay | Admitting: Internal Medicine

## 2015-06-02 VITALS — BP 132/72 | HR 88 | Temp 97.5°F | Resp 18 | Ht 64.0 in | Wt 121.3 lb

## 2015-06-02 DIAGNOSIS — R21 Rash and other nonspecific skin eruption: Secondary | ICD-10-CM

## 2015-06-02 DIAGNOSIS — C3491 Malignant neoplasm of unspecified part of right bronchus or lung: Secondary | ICD-10-CM

## 2015-06-02 DIAGNOSIS — R5383 Other fatigue: Secondary | ICD-10-CM | POA: Diagnosis not present

## 2015-06-02 DIAGNOSIS — C342 Malignant neoplasm of middle lobe, bronchus or lung: Secondary | ICD-10-CM | POA: Diagnosis not present

## 2015-06-02 LAB — COMPREHENSIVE METABOLIC PANEL (CC13)
ALK PHOS: 56 U/L (ref 40–150)
ALT: 26 U/L (ref 0–55)
AST: 31 U/L (ref 5–34)
Albumin: 3.5 g/dL (ref 3.5–5.0)
Anion Gap: 8 mEq/L (ref 3–11)
BUN: 25.1 mg/dL (ref 7.0–26.0)
CHLORIDE: 107 meq/L (ref 98–109)
CO2: 26 mEq/L (ref 22–29)
Calcium: 9.1 mg/dL (ref 8.4–10.4)
Creatinine: 0.8 mg/dL (ref 0.6–1.1)
EGFR: 70 mL/min/{1.73_m2} — AB (ref >90)
Glucose: 94 mg/dl (ref 70–140)
Potassium: 4 mEq/L (ref 3.5–5.1)
Sodium: 141 mEq/L (ref 136–145)
TOTAL PROTEIN: 6.1 g/dL — AB (ref 6.4–8.3)
Total Bilirubin: 0.76 mg/dL (ref 0.20–1.20)

## 2015-06-02 LAB — CBC WITH DIFFERENTIAL/PLATELET
BASO%: 0.5 % (ref 0.0–2.0)
BASOS ABS: 0 10*3/uL (ref 0.0–0.1)
EOS ABS: 0.3 10*3/uL (ref 0.0–0.5)
EOS%: 4.8 % (ref 0.0–7.0)
HCT: 38.2 % (ref 34.8–46.6)
HEMOGLOBIN: 12.7 g/dL (ref 11.6–15.9)
LYMPH%: 7 % — ABNORMAL LOW (ref 14.0–49.7)
MCH: 31.3 pg (ref 25.1–34.0)
MCHC: 33.3 g/dL (ref 31.5–36.0)
MCV: 93.9 fL (ref 79.5–101.0)
MONO#: 0.6 10*3/uL (ref 0.1–0.9)
MONO%: 10.1 % (ref 0.0–14.0)
NEUT#: 4.3 10*3/uL (ref 1.5–6.5)
NEUT%: 77.6 % — AB (ref 38.4–76.8)
Platelets: 240 10*3/uL (ref 145–400)
RBC: 4.07 10*6/uL (ref 3.70–5.45)
RDW: 13.9 % (ref 11.2–14.5)
WBC: 5.6 10*3/uL (ref 3.9–10.3)
lymph#: 0.4 10*3/uL — ABNORMAL LOW (ref 0.9–3.3)

## 2015-06-02 NOTE — Progress Notes (Signed)
[place] Telephone:([phone]   Fax:(336) [place], MD [place] [address]  DIAGNOSIS AND STAGE: : Stage IIIB (T2a, N3, M0) nonsmall cell lung cancer, adenocarcinoma, in a never smoker patient with positive EGFR mutation diagnosed in January 2010.   PRIOR THERAPY:  1) Status post concurrent chemoradiation with weekly carboplatin and paclitaxel; last dose given March 21, 2009.  2) Tarceva at 150 mg p.o. daily, status post approximately 48 months of treatment, discontinued secondary to persistent diarrhea.  3) status post right Pleurx catheter placement for right nonmalignant pleural effusion.  CURRENT THERAPY: Tarceva at 100 mg p.o. Daily, started 03/19/2013, status post 22 months of treatment.   CHEMOTHERAPY INTENT: Maintenance.  CURRENT # OF CHEMOTHERAPY CYCLES: 71 CURRENT ANTIEMETICS: Compazine  CURRENT SMOKING STATUS: Never smoker  ORAL CHEMOTHERAPY AND CONSENT: Tarceva  CURRENT BISPHOSPHONATES USE: None  PAIN MANAGEMENT: 5/10 low back pain and sciatica currently on naproxen.  NARCOTICS INDUCED CONSTIPATION: None  LIVING WILL AND CODE STATUS: Full code but no long-term resuscitation.   INTERVAL HISTORY: Dana [name] 80 y.o. female returns to the clinic today for followup visit. She is tolerating her current treatment with Tarceva fairly well with no significant adverse effects except for mild skin rash and dry skin.  She also has mild fatigue and shortness breath with exertion. She still exercises on daily basis. She denied having any significant diarrhea. She has no chest pain, cough or hemoptysis. No significant weight loss or night sweats. He has no nausea or vomiting. She has no nausea or vomiting, no fever or chills. She had repeat CBC and comprehensive metabolic panel performed earlier today and she is here for evaluation and discussion of her lab results.  MEDICAL HISTORY: Past Medical  History  Diagnosis Date  . Hemorrhoid   . COPD (chronic obstructive pulmonary disease)   . GERD (gastroesophageal reflux disease)   . History of lung cancer ONCOLOGIST--  DR [facility]--  LAST CT ,  NO RECURRENCE OR METS    DX JAN 2010 --  STAGE IIIA  NON-SMALL CELL ADENOCARCINOMA (RIGHT MIDDLE LOBE)---  S/P  CHEMORADIATIO THERAPY  (COMPLETE 03-21-2009)  . History of anal fissures   . Arthritis     HANDS,  WRISTS  . Osteoporosis   . Right wrist pain     MASS  . Dyspnea on exertion   . Normal cardiac stress test     2007  PER PT  . Wears glasses   . Rash, skin     RIGHT FOREARM/ HAND  . lung ca dx'd 2010    ALLERGIES:  is allergic to amoxicillin.  MEDICATIONS:  Current Outpatient Prescriptions  Medication Sig Dispense Refill  . aspirin EC 81 MG tablet Take 81 mg by mouth daily.    . Calcium Carbonate-Vitamin D 600-400 MG-UNIT per chew tablet Chew 1 tablet by mouth 2 (two) times daily.    . cholecalciferol (VITAMIN D) 1000 UNITS tablet Take 1,000 Units by mouth daily.     . Coenzyme Q10 (COQ10) 100 MG CAPS Take 1 capsule by mouth daily.     Marland Kitchen conjugated estrogens (PREMARIN) vaginal cream Place 1 Applicatorful vaginally every Sunday.    . Cyanocobalamin (B-12) 1000 MCG CAPS Take 1,000 mcg by mouth 3 (three) times daily with meals.     . erlotinib (TARCEVA) 100 MG tablet Take 1 tablet (100 mg total) by mouth every evening. Take on an empty stomach  1 hour before meals or 2 hours after 30 tablet 2  . Hydrocodone-Acetaminophen (VICODIN) 5-300 MG TABS Take 1 tablet by mouth 4 (four) times daily as needed (for pain).     . hydroxychloroquine (PLAQUENIL) 200 MG tablet Take 400 mg by mouth every morning.     . loperamide (IMODIUM A-D) 2 MG tablet Take 1 mg by mouth 4 (four) times daily as needed for diarrhea or loose stools.     Marland Kitchen loratadine (CLARITIN) 10 MG tablet Take 10 mg by mouth daily as needed for allergies.     Marland Kitchen LORazepam (ATIVAN) 0.5 MG tablet Take 1 tablet (0.5 mg total) by mouth  every 8 (eight) hours as needed for anxiety. 30 tablet 0  . oxyCODONE (OXY IR/ROXICODONE) 5 MG immediate release tablet Take 1 tablet (5 mg total) by mouth every 4 (four) hours as needed for moderate pain. 30 tablet 0  . Polyethyl Glycol-Propyl Glycol (SYSTANE OP) Place 1 drop into both eyes at bedtime as needed (for dry eyes.).     Marland Kitchen psyllium (METAMUCIL) 58.6 % powder Take 1 packet by mouth daily as needed.    . Simethicone (GAS-X PO) Take 1 tablet by mouth 2 (two) times daily as needed (for gas).     Marland Kitchen tiotropium (SPIRIVA) 18 MCG inhalation capsule Place 18 mcg into inhaler and inhale every morning.      No current facility-administered medications for this visit.    SURGICAL HISTORY:  Past Surgical History  Procedure Laterality Date  . Anal fissure repair  07/09/2011    INTERNAL SPHINCTEROTOMY  . Knee arthroscopy Right 1999  . Thumb arthroscopy Left     - removed bone spur  . Thoracoscopy  01-26-2009    w/   lung/  node biopsy's  . Benign excision left breast central duct  02/1999  . Vault suspension plus cystocele repair with graft  06-13-2010  . Transthoracic echocardiogram  12-23-2008    NORMAL LV/  EF 65-70%/  MILD MR  &  TR  . Total abdominal hysterectomy w/ bilateral salpingoophorectomy  1982    W/  APPENDECTOMY  . Tonsillectomy  AS CHILD  . Excision right wrist mass  2012  . Mass excision Right 03/11/2014    Procedure: RIGHT WRIST DEEP MASS EXCISION WITH CULTURE AND BIOSPY;  Surgeon: Linna Hoff, MD;  Location: [facility];  Service: Orthopedics;  Laterality: Right;  . Chest tube insertion Right 07/14/2014    Procedure: INSERTION PLEURAL DRAINAGE CATHETER RIGHT CHEST;  Surgeon: Grace Isaac, MD;  Location: [facility];  Service: Thoracic;  Laterality: Right;  . Talc pleurodesis Right 09/16/2014    Procedure: TALC PLEURADESIS/ slurry;  Surgeon: Grace Isaac, MD;  Location: [facility] [place];  Service: Thoracic;  Laterality: Right;  . Removal of pleural drainage  catheter Right 10/14/2014    Procedure: REMOVAL OF PLEURAL DRAINAGE CATHETER;  Surgeon: Grace Isaac, MD;  Location: [facility];  Service: Thoracic;  Laterality: Right;    REVIEW OF SYSTEMS:  A comprehensive review of systems was negative except for: Constitutional: positive for fatigue Respiratory: positive for dyspnea on exertion   PHYSICAL EXAMINATION: General appearance: alert, cooperative and no distress Head: Normocephalic, without obvious abnormality, atraumatic Neck: no adenopathy, no JVD, supple, symmetrical, trachea midline and thyroid not enlarged, symmetric, no tenderness/mass/nodules Lymph nodes: Cervical, supraclavicular, and axillary nodes normal. Resp: clear to auscultation bilaterally Cardio: regular rate and rhythm, S1, S2 normal, no murmur, click, rub or gallop GI: soft, non-tender; bowel  sounds normal; no masses,  no organomegaly Extremities: extremities normal, atraumatic, no cyanosis or edema and Papular rash on the inner side of the thigh and leg secondary to recent shingles Neurologic: Alert and oriented X 3, normal strength and tone. Normal symmetric reflexes. Normal coordination and gait  Skin exam: Grade 1 macular skin rash on the face.  ECOG PERFORMANCE STATUS: 1 - Symptomatic but completely ambulatory  Blood pressure 132/72, pulse 88, temperature 97.5 F (36.4 C), temperature source Oral, resp. rate 18, height 5' 4" (1.626 m), weight 121 lb 4.8 oz (55.021 kg), SpO2 100 %.  LABORATORY DATA: Lab Results  Component Value Date   WBC 5.6 06/02/2015   HGB 12.7 06/02/2015   HCT 38.2 06/02/2015   MCV 93.9 06/02/2015   PLT 240 06/02/2015      Chemistry      Component Value Date/Time   NA 141 06/02/2015 1037   NA 143 07/14/2014 0409   NA 145 11/09/2011 1345   K 4.0 06/02/2015 1037   K 4.1 07/14/2014 0409   K 3.9 11/09/2011 1345   CL 106 07/14/2014 0409   CL 108* 06/03/2013 0939   CL 104 11/09/2011 1345   CO2 26 06/02/2015 1037   CO2 25 07/14/2014 0409     CO2 28 11/09/2011 1345   BUN 25.1 06/02/2015 1037   BUN 12 07/14/2014 0409   BUN 20 11/09/2011 1345   CREATININE 0.8 06/02/2015 1037   CREATININE 0.83 07/14/2014 0409   CREATININE 0.6 11/09/2011 1345      Component Value Date/Time   CALCIUM 9.1 06/02/2015 1037   CALCIUM 8.4 07/14/2014 0409   CALCIUM 8.7 11/09/2011 1345   ALKPHOS 56 06/02/2015 1037   ALKPHOS 60 07/13/2014 2023   ALKPHOS 52 11/09/2011 1345   AST 31 06/02/2015 1037   AST 24 07/13/2014 2023   AST 20 11/09/2011 1345   ALT 26 06/02/2015 1037   ALT 23 07/13/2014 2023   ALT 21 11/09/2011 1345   BILITOT 0.76 06/02/2015 1037   BILITOT 0.9 07/13/2014 2023   BILITOT 0.60 11/09/2011 1345       RADIOGRAPHIC STUDIES: Mm Digital Screening Bilateral  05/12/2015   CLINICAL DATA:  Screening.  EXAM: DIGITAL SCREENING BILATERAL MAMMOGRAM WITH CAD  COMPARISON:  Previous exam(s).  ACR Breast Density Category c: The breast tissue is heterogeneously dense, which may obscure small masses.  FINDINGS: There are no findings suspicious for malignancy. Images were processed with CAD.  IMPRESSION: No mammographic evidence of malignancy. A result letter of this screening mammogram will be mailed directly to the patient.  RECOMMENDATION: Screening mammogram in one year. (Code:SM-B-01Y)  BI-RADS CATEGORY  1: Negative.   Electronically Signed   By: Lajean Manes M.D.   On: 05/12/2015 14:15    ASSESSMENT AND PLAN: This is a very pleasant 80 years old white female with history of stage IIIA non-small cell lung cancer status post concurrent chemoradiation and has been on treatment with Tarceva for the last 71 months with no significant evidence for disease progression.  She is tolerating her treatment well except for mild skin rash and fatigue. I recommended for the patient to continue her current treatment with Tarceva at the same dose. She will come back for follow-up visit in 5 weeks with repeat blood work and repeat CT scan of the chest. She  was advised to call immediately if she has any concerning symptoms in the interval. The patient voices understanding of current disease status and treatment options and is  in agreement with the current care plan.  All questions were answered. The patient knows to call the clinic with any problems, questions or concerns. We can certainly see the patient much sooner if necessary.  Disclaimer: This note was dictated with voice recognition software. Similar sounding words can inadvertently be transcribed and may not be corrected upon review.

## 2015-06-02 NOTE — Telephone Encounter (Signed)
Pt confirmed labs/ov per 06/09 POF, gave pt AVS and Calendar.... KJ

## 2015-06-30 ENCOUNTER — Telehealth: Payer: Self-pay | Admitting: *Deleted

## 2015-06-30 NOTE — Telephone Encounter (Signed)
Fax from [facility], [place] shipped to pt 7/5

## 2015-07-04 ENCOUNTER — Encounter (HOSPITAL_COMMUNITY): Payer: Self-pay

## 2015-07-04 ENCOUNTER — Other Ambulatory Visit (HOSPITAL_BASED_OUTPATIENT_CLINIC_OR_DEPARTMENT_OTHER): Payer: Medicare Other

## 2015-07-04 ENCOUNTER — Other Ambulatory Visit: Payer: Medicare Other

## 2015-07-04 ENCOUNTER — Ambulatory Visit (HOSPITAL_COMMUNITY)
Admission: RE | Admit: 2015-07-04 | Discharge: 2015-07-04 | Disposition: A | Discharge status: Home / Self Care | Payer: Medicare Other | Source: Ambulatory Visit | Attending: Internal Medicine | Admitting: Internal Medicine

## 2015-07-04 DIAGNOSIS — C342 Malignant neoplasm of middle lobe, bronchus or lung: Secondary | ICD-10-CM

## 2015-07-04 DIAGNOSIS — K8020 Calculus of gallbladder without cholecystitis without obstruction: Secondary | ICD-10-CM | POA: Diagnosis not present

## 2015-07-04 DIAGNOSIS — C3491 Malignant neoplasm of unspecified part of right bronchus or lung: Secondary | ICD-10-CM

## 2015-07-04 LAB — CBC WITH DIFFERENTIAL/PLATELET
BASO%: 0.4 % (ref 0.0–2.0)
Basophils Absolute: 0 10*3/uL (ref 0.0–0.1)
EOS%: 4.5 % (ref 0.0–7.0)
Eosinophils Absolute: 0.2 10*3/uL (ref 0.0–0.5)
HCT: 38.6 % (ref 34.8–46.6)
HGB: 12.9 g/dL (ref 11.6–15.9)
LYMPH#: 0.3 10*3/uL — AB (ref 0.9–3.3)
LYMPH%: 7.4 % — ABNORMAL LOW (ref 14.0–49.7)
MCH: 31.2 pg (ref 25.1–34.0)
MCHC: 33.5 g/dL (ref 31.5–36.0)
MCV: 93.2 fL (ref 79.5–101.0)
MONO#: 0.5 10*3/uL (ref 0.1–0.9)
MONO%: 10.2 % (ref 0.0–14.0)
NEUT#: 3.6 10*3/uL (ref 1.5–6.5)
NEUT%: 77.5 % — ABNORMAL HIGH (ref 38.4–76.8)
Platelets: 230 10*3/uL (ref 145–400)
RBC: 4.14 10*6/uL (ref 3.70–5.45)
RDW: 14.2 % (ref 11.2–14.5)
WBC: 4.6 10*3/uL (ref 3.9–10.3)

## 2015-07-04 LAB — COMPREHENSIVE METABOLIC PANEL (CC13)
ALK PHOS: 51 U/L (ref 40–150)
ALT: 28 U/L (ref 0–55)
AST: 30 U/L (ref 5–34)
Albumin: 3.5 g/dL (ref 3.5–5.0)
Anion Gap: 8 mEq/L (ref 3–11)
BUN: 17.9 mg/dL (ref 7.0–26.0)
CALCIUM: 9.6 mg/dL (ref 8.4–10.4)
CO2: 29 mEq/L (ref 22–29)
Chloride: 107 mEq/L (ref 98–109)
Creatinine: 0.9 mg/dL (ref 0.6–1.1)
EGFR: 59 mL/min/{1.73_m2} — AB (ref >90)
Glucose: 90 mg/dl (ref 70–140)
POTASSIUM: 3.9 meq/L (ref 3.5–5.1)
SODIUM: 144 meq/L (ref 136–145)
Total Bilirubin: 1.11 mg/dL (ref 0.20–1.20)
Total Protein: 6 g/dL — ABNORMAL LOW (ref 6.4–8.3)

## 2015-07-04 MED ORDER — IOHEXOL 300 MG/ML  SOLN
100.0000 mL | Freq: Once | INTRAMUSCULAR | Status: AC | PRN
  Administered 2015-07-04: 80 mL via INTRAVENOUS

## 2015-07-06 ENCOUNTER — Encounter: Payer: Self-pay | Admitting: Internal Medicine

## 2015-07-06 ENCOUNTER — Ambulatory Visit (HOSPITAL_BASED_OUTPATIENT_CLINIC_OR_DEPARTMENT_OTHER): Payer: Medicare Other | Admitting: Internal Medicine

## 2015-07-06 ENCOUNTER — Telehealth: Payer: Self-pay | Admitting: Internal Medicine

## 2015-07-06 VITALS — BP 147/77 | HR 89 | Temp 98.4°F | Resp 18 | Ht 64.0 in | Wt 121.8 lb

## 2015-07-06 DIAGNOSIS — R21 Rash and other nonspecific skin eruption: Secondary | ICD-10-CM | POA: Diagnosis not present

## 2015-07-06 DIAGNOSIS — C342 Malignant neoplasm of middle lobe, bronchus or lung: Secondary | ICD-10-CM | POA: Diagnosis not present

## 2015-07-06 DIAGNOSIS — R5383 Other fatigue: Secondary | ICD-10-CM

## 2015-07-06 DIAGNOSIS — C3491 Malignant neoplasm of unspecified part of right bronchus or lung: Secondary | ICD-10-CM

## 2015-07-06 NOTE — Telephone Encounter (Signed)
Pt confirmed labs/ov per 07/13 POF, gave pt AVS and Calendar.... KJ

## 2015-07-06 NOTE — Progress Notes (Signed)
[place] [phone]   Fax:(336) [place], [place] [address]  DIAGNOSIS AND STAGE: : Stage IIIB (T2a, N3, M0) nonsmall cell lung cancer, adenocarcinoma, in a never smoker patient with positive EGFR mutation diagnosed in January 2010.   PRIOR THERAPY:  1) Status post concurrent chemoradiation with weekly carboplatin and paclitaxel; last dose given March 21, 2009.  2) Tarceva at 150 mg p.o. daily, status post approximately 48 months of treatment, discontinued secondary to persistent diarrhea.  3) status post right Pleurx catheter placement for right nonmalignant pleural effusion.  CURRENT THERAPY: Tarceva at 100 mg p.o. Daily, started 03/19/2013, status post 23 months of treatment.   CHEMOTHERAPY INTENT: Maintenance.  CURRENT # OF CHEMOTHERAPY CYCLES: 72 CURRENT ANTIEMETICS: Compazine  CURRENT SMOKING STATUS: Never smoker  ORAL CHEMOTHERAPY AND CONSENT: Tarceva  CURRENT BISPHOSPHONATES USE: None  PAIN MANAGEMENT: 5/10 low back pain and sciatica currently on naproxen.  NARCOTICS INDUCED CONSTIPATION: None  LIVING WILL AND CODE STATUS: Full code but no long-term resuscitation.   INTERVAL HISTORY: Dana [name] 80 y.o. female returns to the clinic today for followup visit. She is tolerating her current treatment with Tarceva fairly well with no significant adverse effects except for mild skin rash and dry skin.  She also complained of persistent fatigue and she has to take some naps every day. She still exercises on daily basis. She denied having any significant diarrhea. She has no chest pain, cough or hemoptysis. No significant weight loss or night sweats. He has no nausea or vomiting. She has no nausea or vomiting, no fever or chills. The patient had repeat CT scan of the chest performed recently and she is here for evaluation and discussion of her scan results.  MEDICAL HISTORY: Past  Medical History  Diagnosis Date  . Hemorrhoid   . COPD (chronic obstructive pulmonary disease)   . GERD (gastroesophageal reflux disease)   . History of lung cancer ONCOLOGIST--  DR [facility]--  LAST CT ,  NO RECURRENCE OR METS    DX JAN 2010 --  STAGE IIIA  NON-SMALL CELL ADENOCARCINOMA (RIGHT MIDDLE LOBE)---  S/P  CHEMORADIATIO THERAPY  (COMPLETE 03-21-2009)  . History of anal fissures   . Arthritis     HANDS,  WRISTS  . Osteoporosis   . Right wrist pain     MASS  . Dyspnea on exertion   . Normal cardiac stress test     2007  PER PT  . Wears glasses   . Rash, skin     RIGHT FOREARM/ HAND  . lung ca dx'd 2010    ALLERGIES:  is allergic to amoxicillin.  MEDICATIONS:  Current Outpatient Prescriptions  Medication Sig Dispense Refill  . aspirin EC 81 MG tablet Take 81 mg by mouth daily.    . Calcium Carbonate-Vitamin D 600-400 MG-UNIT per chew tablet Chew 1 tablet by mouth 2 (two) times daily.    . cholecalciferol (VITAMIN D) 1000 UNITS tablet Take 1,000 Units by mouth daily.     . Coenzyme Q10 (COQ10) 100 MG CAPS Take 1 capsule by mouth daily.     Marland Kitchen conjugated estrogens (PREMARIN) vaginal cream Place 1 Applicatorful vaginally every Sunday.    . Cyanocobalamin (B-12) 1000 MCG CAPS Take 1,000 mcg by mouth 3 (three) times daily with meals.     . erlotinib (TARCEVA) 100 MG tablet Take 1 tablet (100 mg total) by mouth every evening.  Take on an empty stomach 1 hour before meals or 2 hours after 30 tablet 2  . Hydrocodone-Acetaminophen (VICODIN) 5-300 MG TABS Take 1 tablet by mouth 4 (four) times daily as needed (for pain).     . hydroxychloroquine (PLAQUENIL) 200 MG tablet Take 400 mg by mouth every morning.     . loperamide (IMODIUM A-D) 2 MG tablet Take 1 mg by mouth 4 (four) times daily as needed for diarrhea or loose stools.     Marland Kitchen loratadine (CLARITIN) 10 MG tablet Take 10 mg by mouth daily as needed for allergies.     Marland Kitchen LORazepam (ATIVAN) 0.5 MG tablet Take 1 tablet (0.5 mg total) by  mouth every 8 (eight) hours as needed for anxiety. 30 tablet 0  . oxyCODONE (OXY IR/ROXICODONE) 5 MG immediate release tablet Take 1 tablet (5 mg total) by mouth every 4 (four) hours as needed for moderate pain. 30 tablet 0  . Polyethyl Glycol-Propyl Glycol (SYSTANE OP) Place 1 drop into both eyes at bedtime as needed (for dry eyes.).     Marland Kitchen psyllium (METAMUCIL) 58.6 % powder Take 1 packet by mouth daily as needed.    . Simethicone (GAS-X PO) Take 1 tablet by mouth 2 (two) times daily as needed (for gas).     Marland Kitchen tiotropium (SPIRIVA) 18 MCG inhalation capsule Place 18 mcg into inhaler and inhale every morning.      No current facility-administered medications for this visit.    SURGICAL HISTORY:  Past Surgical History  Procedure Laterality Date  . Anal fissure repair  07/09/2011    INTERNAL SPHINCTEROTOMY  . Knee arthroscopy Right 1999  . Thumb arthroscopy Left     - removed bone spur  . Thoracoscopy  01-26-2009    w/   lung/  node biopsy's  . Benign excision left breast central duct  02/1999  . Vault suspension plus cystocele repair with graft  06-13-2010  . Transthoracic echocardiogram  12-23-2008    NORMAL LV/  EF 65-70%/  MILD MR  &  TR  . Total abdominal hysterectomy w/ bilateral salpingoophorectomy  1982    W/  APPENDECTOMY  . Tonsillectomy  AS CHILD  . Excision right wrist mass  2012  . Mass excision Right 03/11/2014    Procedure: RIGHT WRIST DEEP MASS EXCISION WITH CULTURE AND BIOSPY;  Surgeon: Linna Hoff, MD;  Location: [place];  Service: Orthopedics;  Laterality: Right;  . Chest tube insertion Right 07/14/2014    Procedure: INSERTION PLEURAL DRAINAGE CATHETER RIGHT CHEST;  Surgeon: Grace Isaac, MD;  Location: [place];  Service: Thoracic;  Laterality: Right;  . Talc pleurodesis Right 09/16/2014    Procedure: TALC PLEURADESIS/ slurry;  Surgeon: Grace Isaac, MD;  Location: [place];  Service: Thoracic;  Laterality: Right;  . Removal of pleural drainage  catheter Right 10/14/2014    Procedure: REMOVAL OF PLEURAL DRAINAGE CATHETER;  Surgeon: Grace Isaac, MD;  Location: [place];  Service: Thoracic;  Laterality: Right;    REVIEW OF SYSTEMS:  Constitutional: positive for fatigue Eyes: negative Ears, nose, mouth, throat, and face: negative Respiratory: positive for dyspnea on exertion Cardiovascular: negative Gastrointestinal: negative Genitourinary:negative Integument/breast: negative Hematologic/lymphatic: negative Musculoskeletal:negative Neurological: negative Behavioral/Psych: negative Endocrine: negative Allergic/Immunologic: negative   PHYSICAL EXAMINATION: General appearance: alert, cooperative and no distress Head: Normocephalic, without obvious abnormality, atraumatic Neck: no adenopathy, no JVD, supple, symmetrical, trachea midline and thyroid not enlarged, symmetric, no tenderness/mass/nodules Lymph nodes: Cervical, supraclavicular, and axillary nodes normal.  Resp: clear to auscultation bilaterally Cardio: regular rate and rhythm, S1, S2 normal, no murmur, click, rub or gallop GI: soft, non-tender; bowel sounds normal; no masses,  no organomegaly Extremities: extremities normal, atraumatic, no cyanosis or edema and Papular rash on the inner side of the thigh and leg secondary to recent shingles Neurologic: Alert and oriented X 3, normal strength and tone. Normal symmetric reflexes. Normal coordination and gait  Skin exam: Grade 1 macular skin rash on the face.  ECOG PERFORMANCE STATUS: 1 - Symptomatic but completely ambulatory  There were no vitals taken for this visit.  LABORATORY DATA: Lab Results  Component Value Date   WBC 4.6 07/04/2015   HGB 12.9 07/04/2015   HCT 38.6 07/04/2015   MCV 93.2 07/04/2015   PLT 230 07/04/2015      Chemistry      Component Value Date/Time   NA 144 07/04/2015 0755   NA 143 07/14/2014 0409   NA 145 11/09/2011 1345   K 3.9 07/04/2015 0755   K 4.1 07/14/2014 0409   K 3.9  11/09/2011 1345   CL 106 07/14/2014 0409   CL 108* 06/03/2013 0939   CL 104 11/09/2011 1345   CO2 29 07/04/2015 0755   CO2 25 07/14/2014 0409   CO2 28 11/09/2011 1345   BUN 17.9 07/04/2015 0755   BUN 12 07/14/2014 0409   BUN 20 11/09/2011 1345   CREATININE 0.9 07/04/2015 0755   CREATININE 0.83 07/14/2014 0409   CREATININE 0.6 11/09/2011 1345      Component Value Date/Time   CALCIUM 9.6 07/04/2015 0755   CALCIUM 8.4 07/14/2014 0409   CALCIUM 8.7 11/09/2011 1345   ALKPHOS 51 07/04/2015 0755   ALKPHOS 60 07/13/2014 2023   ALKPHOS 52 11/09/2011 1345   AST 30 07/04/2015 0755   AST 24 07/13/2014 2023   AST 20 11/09/2011 1345   ALT 28 07/04/2015 0755   ALT 23 07/13/2014 2023   ALT 21 11/09/2011 1345   BILITOT 1.11 07/04/2015 0755   BILITOT 0.9 07/13/2014 2023   BILITOT 0.60 11/09/2011 1345       RADIOGRAPHIC STUDIES: Ct Chest W Contrast  07/04/2015   CLINICAL DATA:  Subsequent encounter for right-sided lung cancer.  EXAM: CT CHEST WITH CONTRAST  TECHNIQUE: Multidetector CT imaging of the chest was performed during intravenous contrast administration.  CONTRAST:  20mL OMNIPAQUE IOHEXOL 300 MG/ML  SOLN  COMPARISON:  04/12/2015.  FINDINGS: Mediastinum / Lymph Nodes: There is no axillary lymphadenopathy. 9 mm short axis subcarinal lymph node measured on the previous study is now 5 mm short axis. The abnormal soft tissue attenuation in the right hilum and medial right lung is stable. Circumferential wall thickening in the mid esophagus may be related to radiation. Heart size is normal. No pericardial effusion.  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 Volume loss in the right hemi thorax is stable. Small focus of pleural-parenchymal density in the anterior left lung (image 20 series 5) is stable. No new or progressive pulmonary nodule. No focal airspace consolidation. Scattered areas of small volume loculated right pleural fluid again noted and decreased in  the minor fissure. High attenuation material along the posterior pleura of the right hemi thorax is compatible with reported history of previous talc pleurodesis.  MSK / Soft Tissues: Bone windows reveal no worrisome lytic or sclerotic osseous lesions.  Upper Abdomen: Calcified stones are seen in the gallbladder. No adrenal nodule or mass.  IMPRESSION: 1. Post  treatment changes in the right hemi thorax, stable in the interval. No new or progressive findings to suggest recurrent disease. 2. Interval decrease in loculated pleural fluid in the minor fissure. 3. Cholelithiasis.   Electronically Signed   By: Misty Stanley M.D.   On: 07/04/2015 09:33    ASSESSMENT AND PLAN: This is a very pleasant 80 years old white female with history of stage IIIA non-small cell lung cancer status post concurrent chemoradiation and has been on treatment with Tarceva for the last 71 months with no significant evidence for disease progression.  She is tolerating her treatment well except for mild skin rash and fatigue. The recent CT scan of the chest showed no evidence for disease progression or recurrence. I discussed the scan results with the patient today. I recommended for the patient to continue her current treatment with Tarceva at the same dose. She would come back for follow-up visit in one month for reevaluation with repeat blood work. I may consider restaging scan every 6 months at this point since the patient has been doing very well for the last few years. For the persistent fatigue, I advised the patient to consult with her primary care physician regarding evaluation of thyroid function. She was advised to call immediately if she has any concerning symptoms in the interval. The patient voices understanding of current disease status and treatment options and is in agreement with the current care plan.  All questions were answered. The patient knows to call the clinic with any problems, questions or concerns. We  can certainly see the patient much sooner if necessary.  Disclaimer: This note was dictated with voice recognition software. Similar sounding words can inadvertently be transcribed and may not be corrected upon review.

## 2015-08-10 ENCOUNTER — Other Ambulatory Visit (HOSPITAL_BASED_OUTPATIENT_CLINIC_OR_DEPARTMENT_OTHER): Payer: Medicare Other

## 2015-08-10 ENCOUNTER — Telehealth: Payer: Self-pay | Admitting: Internal Medicine

## 2015-08-10 ENCOUNTER — Encounter: Payer: Self-pay | Admitting: Internal Medicine

## 2015-08-10 ENCOUNTER — Ambulatory Visit (HOSPITAL_BASED_OUTPATIENT_CLINIC_OR_DEPARTMENT_OTHER): Payer: Medicare Other | Admitting: Internal Medicine

## 2015-08-10 VITALS — BP 111/58 | HR 93 | Temp 98.3°F | Resp 17 | Ht 64.0 in | Wt 121.1 lb

## 2015-08-10 DIAGNOSIS — C3491 Malignant neoplasm of unspecified part of right bronchus or lung: Secondary | ICD-10-CM

## 2015-08-10 DIAGNOSIS — R5383 Other fatigue: Secondary | ICD-10-CM

## 2015-08-10 DIAGNOSIS — R21 Rash and other nonspecific skin eruption: Secondary | ICD-10-CM

## 2015-08-10 DIAGNOSIS — C342 Malignant neoplasm of middle lobe, bronchus or lung: Secondary | ICD-10-CM | POA: Diagnosis not present

## 2015-08-10 LAB — CBC WITH DIFFERENTIAL/PLATELET
BASO%: 0.2 % (ref 0.0–2.0)
Basophils Absolute: 0 10*3/uL (ref 0.0–0.1)
EOS%: 3.5 % (ref 0.0–7.0)
Eosinophils Absolute: 0.2 10*3/uL (ref 0.0–0.5)
HCT: 34.6 % — ABNORMAL LOW (ref 34.8–46.6)
HGB: 11.9 g/dL (ref 11.6–15.9)
LYMPH#: 0.5 10*3/uL — AB (ref 0.9–3.3)
LYMPH%: 9.1 % — ABNORMAL LOW (ref 14.0–49.7)
MCH: 32 pg (ref 25.1–34.0)
MCHC: 34.4 g/dL (ref 31.5–36.0)
MCV: 93 fL (ref 79.5–101.0)
MONO#: 0.5 10*3/uL (ref 0.1–0.9)
MONO%: 9.8 % (ref 0.0–14.0)
NEUT#: 4.2 10*3/uL (ref 1.5–6.5)
NEUT%: 77.4 % — AB (ref 38.4–76.8)
Platelets: 218 10*3/uL (ref 145–400)
RBC: 3.72 10*6/uL (ref 3.70–5.45)
RDW: 13.8 % (ref 11.2–14.5)
WBC: 5.4 10*3/uL (ref 3.9–10.3)

## 2015-08-10 LAB — COMPREHENSIVE METABOLIC PANEL (CC13)
ALT: 25 U/L (ref 0–55)
AST: 40 U/L — AB (ref 5–34)
Albumin: 3.3 g/dL — ABNORMAL LOW (ref 3.5–5.0)
Alkaline Phosphatase: 50 U/L (ref 40–150)
Anion Gap: 8 mEq/L (ref 3–11)
BUN: 22 mg/dL (ref 7.0–26.0)
CHLORIDE: 106 meq/L (ref 98–109)
CO2: 26 mEq/L (ref 22–29)
CREATININE: 0.9 mg/dL (ref 0.6–1.1)
Calcium: 8.9 mg/dL (ref 8.4–10.4)
EGFR: 59 mL/min/{1.73_m2} — ABNORMAL LOW (ref >90)
GLUCOSE: 87 mg/dL (ref 70–140)
Potassium: 3.9 mEq/L (ref 3.5–5.1)
Sodium: 139 mEq/L (ref 136–145)
Total Bilirubin: 1.16 mg/dL (ref 0.20–1.20)
Total Protein: 5.7 g/dL — ABNORMAL LOW (ref 6.4–8.3)

## 2015-08-10 NOTE — Telephone Encounter (Signed)
Gave avs & calendar for October. °

## 2015-08-10 NOTE — Progress Notes (Signed)
[place] Telephone:([phone]   Fax:(336) [place], [place] [address]  DIAGNOSIS AND STAGE: : Stage IIIB (T2a, N3, M0) nonsmall cell lung cancer, adenocarcinoma, in a never smoker patient with positive EGFR mutation diagnosed in January 2010.   PRIOR THERAPY:  1) Status post concurrent chemoradiation with weekly carboplatin and paclitaxel; last dose given March 21, 2009.  2) Tarceva at 150 mg p.o. daily, status post approximately 48 months of treatment, discontinued secondary to persistent diarrhea.  3) status post right Pleurx catheter placement for right nonmalignant pleural effusion.  CURRENT THERAPY: Tarceva at 100 mg p.o. Daily, started 03/19/2013, status post 24 months of treatment.   CHEMOTHERAPY INTENT: Maintenance.  CURRENT # OF CHEMOTHERAPY CYCLES: 73 CURRENT ANTIEMETICS: Compazine  CURRENT SMOKING STATUS: Never smoker  ORAL CHEMOTHERAPY AND CONSENT: Tarceva  CURRENT BISPHOSPHONATES USE: None  PAIN MANAGEMENT: 5/10 low back pain and sciatica currently on naproxen.  NARCOTICS INDUCED CONSTIPATION: None  LIVING WILL AND CODE STATUS: Full code but no long-term resuscitation.   INTERVAL HISTORY: Dana [name] 80 y.o. female returns to the clinic today for followup visit. She is tolerating her current treatment with Tarceva fairly well with no significant adverse effects except for mild skin rash and dry skin.  She also mild fatigue. She still exercises on daily basis. She denied having any significant diarrhea. She has no chest pain, cough or hemoptysis. No significant weight loss or night sweats. He has no nausea or vomiting. She has no nausea or vomiting, no fever or chills. She has repeat CBC and comprehensive metabolic panel performed earlier today and she is here for evaluation and discussion of her lab results.  MEDICAL HISTORY: Past Medical History  Diagnosis Date  .  Hemorrhoid   . COPD (chronic obstructive pulmonary disease)   . GERD (gastroesophageal reflux disease)   . History of lung cancer ONCOLOGIST--  DR [facility]--  LAST CT ,  NO RECURRENCE OR METS    DX JAN 2010 --  STAGE IIIA  NON-SMALL CELL ADENOCARCINOMA (RIGHT MIDDLE LOBE)---  S/P  CHEMORADIATIO THERAPY  (COMPLETE 03-21-2009)  . History of anal fissures   . Arthritis     HANDS,  WRISTS  . Osteoporosis   . Right wrist pain     MASS  . Dyspnea on exertion   . Normal cardiac stress test     2007  PER PT  . Wears glasses   . Rash, skin     RIGHT FOREARM/ HAND  . lung ca dx'd 2010    ALLERGIES:  is allergic to amoxicillin.  MEDICATIONS:  Current Outpatient Prescriptions  Medication Sig Dispense Refill  . aspirin EC 81 MG tablet Take 81 mg by mouth daily.    . Calcium Carbonate-Vitamin D 600-400 MG-UNIT per chew tablet Chew 1 tablet by mouth 2 (two) times daily.    . cholecalciferol (VITAMIN D) 1000 UNITS tablet Take 1,000 Units by mouth daily.     . Coenzyme Q10 (COQ10) 100 MG CAPS Take 1 capsule by mouth daily.     Marland Kitchen conjugated estrogens (PREMARIN) vaginal cream Place 1 Applicatorful vaginally every Sunday.    . Cyanocobalamin (B-12) 1000 MCG CAPS Take 1,000 mcg by mouth 3 (three) times daily with meals.     . erlotinib (TARCEVA) 100 MG tablet Take 1 tablet (100 mg total) by mouth every evening. Take on an empty stomach 1 hour before meals or 2  hours after 30 tablet 2  . Hydrocodone-Acetaminophen (VICODIN) 5-300 MG TABS Take 1 tablet by mouth 4 (four) times daily as needed (for pain).     . hydroxychloroquine (PLAQUENIL) 200 MG tablet Take 400 mg by mouth every morning.     . loratadine (CLARITIN) 10 MG tablet Take 10 mg by mouth daily as needed for allergies.     Marland Kitchen LORazepam (ATIVAN) 0.5 MG tablet Take 1 tablet (0.5 mg total) by mouth every 8 (eight) hours as needed for anxiety. 30 tablet 0  . Polyethyl Glycol-Propyl Glycol (SYSTANE OP) Place 1 drop into both eyes at bedtime as needed  (for dry eyes.).     Marland Kitchen psyllium (METAMUCIL) 58.6 % powder Take 1 packet by mouth daily as needed.    . Simethicone (GAS-X PO) Take 1 tablet by mouth 2 (two) times daily as needed (for gas).     Marland Kitchen tiotropium (SPIRIVA) 18 MCG inhalation capsule Place 18 mcg into inhaler and inhale every morning.     . loperamide (IMODIUM A-D) 2 MG tablet Take 1 mg by mouth 4 (four) times daily as needed for diarrhea or loose stools.     Marland Kitchen oxyCODONE (OXY IR/ROXICODONE) 5 MG immediate release tablet Take 1 tablet (5 mg total) by mouth every 4 (four) hours as needed for moderate pain. (Patient not taking: Reported on 07/06/2015) 30 tablet 0   No current facility-administered medications for this visit.    SURGICAL HISTORY:  Past Surgical History  Procedure Laterality Date  . Anal fissure repair  07/09/2011    INTERNAL SPHINCTEROTOMY  . Knee arthroscopy Right 1999  . Thumb arthroscopy Left     - removed bone spur  . Thoracoscopy  01-26-2009    w/   lung/  node biopsy's  . Benign excision left breast central duct  02/1999  . Vault suspension plus cystocele repair with graft  06-13-2010  . Transthoracic echocardiogram  12-23-2008    NORMAL LV/  EF 65-70%/  MILD MR  &  TR  . Total abdominal hysterectomy w/ bilateral salpingoophorectomy  1982    W/  APPENDECTOMY  . Tonsillectomy  AS CHILD  . Excision right wrist mass  2012  . Mass excision Right 03/11/2014    Procedure: RIGHT WRIST DEEP MASS EXCISION WITH CULTURE AND BIOSPY;  Surgeon: Linna Hoff, MD;  Location: [place];  Service: Orthopedics;  Laterality: Right;  . Chest tube insertion Right 07/14/2014    Procedure: INSERTION PLEURAL DRAINAGE CATHETER RIGHT CHEST;  Surgeon: Grace Isaac, MD;  Location: [place];  Service: Thoracic;  Laterality: Right;  . Talc pleurodesis Right 09/16/2014    Procedure: TALC PLEURADESIS/ slurry;  Surgeon: Grace Isaac, MD;  Location: [place];  Service: Thoracic;  Laterality: Right;  . Removal of pleural  drainage catheter Right 10/14/2014    Procedure: REMOVAL OF PLEURAL DRAINAGE CATHETER;  Surgeon: Grace Isaac, MD;  Location: [place];  Service: Thoracic;  Laterality: Right;    REVIEW OF SYSTEMS:  Constitutional: positive for fatigue Eyes: negative Ears, nose, mouth, throat, and face: negative Respiratory: positive for dyspnea on exertion Cardiovascular: negative Gastrointestinal: negative Genitourinary:negative Integument/breast: negative Hematologic/lymphatic: negative Musculoskeletal:negative Neurological: negative Behavioral/Psych: negative Endocrine: negative Allergic/Immunologic: negative   PHYSICAL EXAMINATION: General appearance: alert, cooperative and no distress Head: Normocephalic, without obvious abnormality, atraumatic Neck: no adenopathy, no JVD, supple, symmetrical, trachea midline and thyroid not enlarged, symmetric, no tenderness/mass/nodules Lymph nodes: Cervical, supraclavicular, and axillary nodes normal. Resp: clear to auscultation bilaterally  Cardio: regular rate and rhythm, S1, S2 normal, no murmur, click, rub or gallop GI: soft, non-tender; bowel sounds normal; no masses,  no organomegaly Extremities: extremities normal, atraumatic, no cyanosis or edema and Papular rash on the inner side of the thigh and leg secondary to recent shingles Neurologic: Alert and oriented X 3, normal strength and tone. Normal symmetric reflexes. Normal coordination and gait  Skin exam: Grade 1 macular skin rash on the face.  ECOG PERFORMANCE STATUS: 1 - Symptomatic but completely ambulatory  Blood pressure 111/58, pulse 93, temperature 98.3 F (36.8 C), temperature source Oral, resp. rate 17, height _0  (1.626 m), weight 121 lb 1.6 oz (54.931 kg), SpO2 98 %.  LABORATORY DATA: Lab Results  Component Value Date   WBC 5.4 08/10/2015   HGB 11.9 08/10/2015   HCT 34.6* 08/10/2015   MCV 93.0 08/10/2015   PLT 218 08/10/2015      Chemistry      Component Value Date/Time     NA 139 08/10/2015 0953   NA 143 07/14/2014 0409   NA 145 11/09/2011 1345   K 3.9 08/10/2015 0953   K 4.1 07/14/2014 0409   K 3.9 11/09/2011 1345   CL 106 07/14/2014 0409   CL 108* 06/03/2013 0939   CL 104 11/09/2011 1345   CO2 26 08/10/2015 0953   CO2 25 07/14/2014 0409   CO2 28 11/09/2011 1345   BUN 22.0 08/10/2015 0953   BUN 12 07/14/2014 0409   BUN 20 11/09/2011 1345   CREATININE 0.9 08/10/2015 0953   CREATININE 0.83 07/14/2014 0409   CREATININE 0.6 11/09/2011 1345      Component Value Date/Time   CALCIUM 8.9 08/10/2015 0953   CALCIUM 8.4 07/14/2014 0409   CALCIUM 8.7 11/09/2011 1345   ALKPHOS 50 08/10/2015 0953   ALKPHOS 60 07/13/2014 2023   ALKPHOS 52 11/09/2011 1345   AST 40* 08/10/2015 0953   AST 24 07/13/2014 2023   AST 20 11/09/2011 1345   ALT 25 08/10/2015 0953   ALT 23 07/13/2014 2023   ALT 21 11/09/2011 1345   BILITOT 1.16 08/10/2015 0953   BILITOT 0.9 07/13/2014 2023   BILITOT 0.60 11/09/2011 1345       RADIOGRAPHIC STUDIES: No results found.  ASSESSMENT AND PLAN: This is a very pleasant 80 years old white female with history of stage IIIA non-small cell lung cancer status post concurrent chemoradiation and has been on treatment with Tarceva for the last 72 months with no significant evidence for disease progression.  She is tolerating her treatment well except for mild skin rash and fatigue. I recommended for the patient to continue her current treatment with Tarceva at the same dose. She would come back for follow-up visit in 2 months for reevaluation with repeat blood work as well as CT scan of the chest.  She was advised to call immediately if she has any concerning symptoms in the interval. The patient voices understanding of current disease status and treatment options and is in agreement with the current care plan.  All questions were answered. The patient knows to call the clinic with any problems, questions or concerns. We can certainly see  the patient much sooner if necessary.  Disclaimer: This note was dictated with voice recognition software. Similar sounding words can inadvertently be transcribed and may not be corrected upon review.

## 2015-08-30 ENCOUNTER — Other Ambulatory Visit: Payer: Self-pay | Admitting: *Deleted

## 2015-08-30 DIAGNOSIS — C3491 Malignant neoplasm of unspecified part of right bronchus or lung: Secondary | ICD-10-CM

## 2015-08-30 MED ORDER — ERLOTINIB HCL 100 MG PO TABS: 100.0000 mg | ORAL_TABLET | Freq: Every evening | ORAL | Status: DC

## 2015-09-01 ENCOUNTER — Other Ambulatory Visit: Payer: Self-pay | Admitting: Medical Oncology

## 2015-09-01 ENCOUNTER — Telehealth: Payer: Self-pay | Admitting: *Deleted

## 2015-09-01 DIAGNOSIS — C3491 Malignant neoplasm of unspecified part of right bronchus or lung: Secondary | ICD-10-CM

## 2015-09-01 MED ORDER — ERLOTINIB HCL 100 MG PO TABS: 100.0000 mg | ORAL_TABLET | Freq: Every evening | ORAL | Status: DC

## 2015-09-01 NOTE — Telephone Encounter (Signed)
THIS IS NOT THE FIRST REQUEST FOR PT.'S MEDICATION REFILL.

## 2015-09-01 NOTE — Progress Notes (Signed)
[facility] did not receive fax. Refill sent.

## 2015-10-05 ENCOUNTER — Other Ambulatory Visit (HOSPITAL_BASED_OUTPATIENT_CLINIC_OR_DEPARTMENT_OTHER): Payer: Medicare Other

## 2015-10-05 ENCOUNTER — Encounter (HOSPITAL_COMMUNITY): Payer: Self-pay

## 2015-10-05 ENCOUNTER — Ambulatory Visit (HOSPITAL_COMMUNITY)
Admission: RE | Admit: 2015-10-05 | Discharge: 2015-10-05 | Disposition: A | Discharge status: Home / Self Care | Payer: Medicare Other | Source: Ambulatory Visit | Attending: Internal Medicine | Admitting: Internal Medicine

## 2015-10-05 DIAGNOSIS — K8020 Calculus of gallbladder without cholecystitis without obstruction: Secondary | ICD-10-CM | POA: Insufficient documentation

## 2015-10-05 DIAGNOSIS — C342 Malignant neoplasm of middle lobe, bronchus or lung: Secondary | ICD-10-CM

## 2015-10-05 DIAGNOSIS — C3491 Malignant neoplasm of unspecified part of right bronchus or lung: Secondary | ICD-10-CM | POA: Insufficient documentation

## 2015-10-05 LAB — CBC WITH DIFFERENTIAL/PLATELET
BASO%: 0.2 % (ref 0.0–2.0)
BASOS ABS: 0 10*3/uL (ref 0.0–0.1)
EOS ABS: 0.2 10*3/uL (ref 0.0–0.5)
EOS%: 3.6 % (ref 0.0–7.0)
HEMATOCRIT: 36.3 % (ref 34.8–46.6)
HGB: 12.2 g/dL (ref 11.6–15.9)
LYMPH%: 7.9 % — ABNORMAL LOW (ref 14.0–49.7)
MCH: 31.8 pg (ref 25.1–34.0)
MCHC: 33.5 g/dL (ref 31.5–36.0)
MCV: 94.7 fL (ref 79.5–101.0)
MONO#: 0.5 10*3/uL (ref 0.1–0.9)
MONO%: 9.4 % (ref 0.0–14.0)
NEUT%: 78.9 % — ABNORMAL HIGH (ref 38.4–76.8)
NEUTROS ABS: 4.2 10*3/uL (ref 1.5–6.5)
Platelets: 230 10*3/uL (ref 145–400)
RBC: 3.83 10*6/uL (ref 3.70–5.45)
RDW: 13.3 % (ref 11.2–14.5)
WBC: 5.3 10*3/uL (ref 3.9–10.3)
lymph#: 0.4 10*3/uL — ABNORMAL LOW (ref 0.9–3.3)

## 2015-10-05 LAB — COMPREHENSIVE METABOLIC PANEL (CC13)
ALT: 26 U/L (ref 0–55)
ANION GAP: 10 meq/L (ref 3–11)
AST: 33 U/L (ref 5–34)
Albumin: 3.4 g/dL — ABNORMAL LOW (ref 3.5–5.0)
Alkaline Phosphatase: 51 U/L (ref 40–150)
BUN: 18.7 mg/dL (ref 7.0–26.0)
CALCIUM: 9.1 mg/dL (ref 8.4–10.4)
CO2: 26 meq/L (ref 22–29)
Chloride: 109 mEq/L (ref 98–109)
Creatinine: 0.9 mg/dL (ref 0.6–1.1)
EGFR: 64 mL/min/{1.73_m2} — ABNORMAL LOW (ref >90)
Glucose: 84 mg/dl (ref 70–140)
POTASSIUM: 3.2 meq/L — AB (ref 3.5–5.1)
Sodium: 145 mEq/L (ref 136–145)
Total Bilirubin: 1.04 mg/dL (ref 0.20–1.20)
Total Protein: 5.9 g/dL — ABNORMAL LOW (ref 6.4–8.3)

## 2015-10-05 MED ORDER — IOHEXOL 300 MG/ML  SOLN
75.0000 mL | Freq: Once | INTRAMUSCULAR | Status: AC | PRN
  Administered 2015-10-05: 75 mL via INTRAVENOUS

## 2015-10-12 ENCOUNTER — Ambulatory Visit (HOSPITAL_BASED_OUTPATIENT_CLINIC_OR_DEPARTMENT_OTHER): Payer: Medicare Other | Admitting: Internal Medicine

## 2015-10-12 ENCOUNTER — Encounter: Payer: Self-pay | Admitting: Internal Medicine

## 2015-10-12 ENCOUNTER — Telehealth: Payer: Self-pay | Admitting: Internal Medicine

## 2015-10-12 VITALS — BP 145/74 | HR 94 | Temp 97.9°F | Resp 18 | Ht 64.0 in | Wt 116.2 lb

## 2015-10-12 DIAGNOSIS — M899 Disorder of bone, unspecified: Secondary | ICD-10-CM | POA: Diagnosis not present

## 2015-10-12 DIAGNOSIS — C342 Malignant neoplasm of middle lobe, bronchus or lung: Secondary | ICD-10-CM | POA: Diagnosis not present

## 2015-10-12 DIAGNOSIS — C3491 Malignant neoplasm of unspecified part of right bronchus or lung: Secondary | ICD-10-CM

## 2015-10-12 NOTE — Progress Notes (Signed)
[facility] Telephone:(336) [phone]   Fax:(336) [place], [place] [address]  DIAGNOSIS AND STAGE: : Stage IIIB (T2a, N3, M0) nonsmall cell lung cancer, adenocarcinoma, in a never smoker patient with positive EGFR mutation diagnosed in January 2010.   PRIOR THERAPY:  1) Status post concurrent chemoradiation with weekly carboplatin and paclitaxel; last dose given March 21, 2009.  2) Tarceva at 150 mg p.o. daily, status post approximately 48 months of treatment, discontinued secondary to persistent diarrhea.  3) status post right Pleurx catheter placement for right nonmalignant pleural effusion.  CURRENT THERAPY: Tarceva at 100 mg p.o. Daily, started 03/19/2013, status post 26 months of treatment.   CHEMOTHERAPY INTENT: Maintenance.  CURRENT # OF CHEMOTHERAPY CYCLES: 75 CURRENT ANTIEMETICS: Compazine  CURRENT SMOKING STATUS: Never smoker  ORAL CHEMOTHERAPY AND CONSENT: Tarceva  CURRENT BISPHOSPHONATES USE: None  PAIN MANAGEMENT: 5/10 low back pain and sciatica currently on naproxen.  NARCOTICS INDUCED CONSTIPATION: None  LIVING WILL AND CODE STATUS: Full code but no long-term resuscitation.   INTERVAL HISTORY: Dana [name] 80 y.o. female returns to the clinic today for followup visit. She is tolerating her current treatment with Tarceva fairly well with no significant adverse effects except for dry skin.  She also mild fatigue. She still exercises on daily basis. She fell in the yard 2 months ago and hit her chest with mild swelling in the sternal area. She denied having any significant diarrhea. She has no chest pain, cough or hemoptysis. No significant weight loss or night sweats. He has no nausea or vomiting. She has no nausea or vomiting, no fever or chills. She has repeat CBC and comprehensive metabolic panel as well as CT scan of the chest recently and she is here for evaluation and discussion of  her lab results.  MEDICAL HISTORY: Past Medical History  Diagnosis Date  . Hemorrhoid   . COPD (chronic obstructive pulmonary disease) ([place])   . GERD (gastroesophageal reflux disease)   . History of lung cancer ONCOLOGIST--  [facility]--  LAST CT ,  NO RECURRENCE OR METS    DX JAN 2010 --  STAGE IIIA  NON-SMALL CELL ADENOCARCINOMA (RIGHT MIDDLE LOBE)---  S/P  CHEMORADIATIO THERAPY  (COMPLETE 03-21-2009)  . History of anal fissures   . Arthritis     HANDS,  WRISTS  . Osteoporosis   . Right wrist pain     MASS  . Dyspnea on exertion   . Normal cardiac stress test     2007  PER PT  . Wears glasses   . Rash, skin     RIGHT FOREARM/ HAND  . lung ca dx'd 2010    ALLERGIES:  is allergic to amoxicillin.  MEDICATIONS:  Current Outpatient Prescriptions  Medication Sig Dispense Refill  . aspirin EC 81 MG tablet Take 81 mg by mouth daily.    . Calcium Carbonate-Vitamin D 600-400 MG-UNIT per chew tablet Chew 1 tablet by mouth 2 (two) times daily.    . cholecalciferol (VITAMIN D) 1000 UNITS tablet Take 1,000 Units by mouth daily.     . Coenzyme Q10 (COQ10) 100 MG CAPS Take 1 capsule by mouth daily.     Marland Kitchen conjugated estrogens (PREMARIN) vaginal cream Place 1 Applicatorful vaginally every Sunday.    . Cyanocobalamin (B-12) 1000 MCG CAPS Take 1,000 mcg by mouth 3 (three) times daily with meals.     . erlotinib (TARCEVA) 100 MG  tablet Take 1 tablet (100 mg total) by mouth every evening. Take on an empty stomach 1 hour before meals or 2 hours after 30 tablet 2  . Hydrocodone-Acetaminophen (VICODIN) 5-300 MG TABS Take 1 tablet by mouth 4 (four) times daily as needed (for pain).     . hydroxychloroquine (PLAQUENIL) 200 MG tablet Take 400 mg by mouth every morning.     . loperamide (IMODIUM A-D) 2 MG tablet Take 1 mg by mouth 4 (four) times daily as needed for diarrhea or loose stools.     Marland Kitchen loratadine (CLARITIN) 10 MG tablet Take 10 mg by mouth daily as needed for allergies.     Marland Kitchen LORazepam  (ATIVAN) 0.5 MG tablet Take 1 tablet (0.5 mg total) by mouth every 8 (eight) hours as needed for anxiety. 30 tablet 0  . oxyCODONE (OXY IR/ROXICODONE) 5 MG immediate release tablet Take 1 tablet (5 mg total) by mouth every 4 (four) hours as needed for moderate pain. (Patient not taking: Reported on 07/06/2015) 30 tablet 0  . Polyethyl Glycol-Propyl Glycol (SYSTANE OP) Place 1 drop into both eyes at bedtime as needed (for dry eyes.).     Marland Kitchen psyllium (METAMUCIL) 58.6 % powder Take 1 packet by mouth daily as needed.    . Simethicone (GAS-X PO) Take 1 tablet by mouth 2 (two) times daily as needed (for gas).     Marland Kitchen tiotropium (SPIRIVA) 18 MCG inhalation capsule Place 18 mcg into inhaler and inhale every morning.      No current facility-administered medications for this visit.    SURGICAL HISTORY:  Past Surgical History  Procedure Laterality Date  . Anal fissure repair  07/09/2011    INTERNAL SPHINCTEROTOMY  . Knee arthroscopy Right 1999  . Thumb arthroscopy Left     - removed bone spur  . Thoracoscopy  01-26-2009    w/   lung/  node biopsy's  . Benign excision left breast central duct  02/1999  . Vault suspension plus cystocele repair with graft  06-13-2010  . Transthoracic echocardiogram  12-23-2008    NORMAL LV/  EF 65-70%/  MILD MR  &  TR  . Total abdominal hysterectomy w/ bilateral salpingoophorectomy  1982    W/  APPENDECTOMY  . Tonsillectomy  AS CHILD  . Excision right wrist mass  2012  . Mass excision Right 03/11/2014    Procedure: RIGHT WRIST DEEP MASS EXCISION WITH CULTURE AND BIOSPY;  Surgeon: Linna Hoff, MD;  Location: [facility];  Service: Orthopedics;  Laterality: Right;  . Chest tube insertion Right 07/14/2014    Procedure: INSERTION PLEURAL DRAINAGE CATHETER RIGHT CHEST;  Surgeon: Grace Isaac, MD;  [facility];  Service: Thoracic;  Laterality: Right;  . Talc pleurodesis Right 09/16/2014    Procedure: TALC PLEURADESIS/ slurry;  Surgeon: Grace Isaac, MD;  Location: [facility];  Service: Thoracic;  Laterality: Right;  . Removal of pleural drainage catheter Right 10/14/2014    Procedure: REMOVAL OF PLEURAL DRAINAGE CATHETER;  Surgeon: Grace Isaac, MD;  Location: [facility];  Service: Thoracic;  Laterality: Right;    REVIEW OF SYSTEMS:  Constitutional: negative Eyes: negative Ears, nose, mouth, throat, and face: negative Respiratory: negative Cardiovascular: negative Gastrointestinal: negative Genitourinary:negative Integument/breast: negative Hematologic/lymphatic: negative Musculoskeletal:negative Neurological: negative Behavioral/Psych: negative Endocrine: negative Allergic/Immunologic: negative   PHYSICAL EXAMINATION: General appearance: alert, cooperative and no distress Head: Normocephalic, without obvious abnormality, atraumatic Neck: no adenopathy, no JVD, supple, symmetrical, trachea midline and thyroid not enlarged,  symmetric, no tenderness/mass/nodules Lymph nodes: Cervical, supraclavicular, and axillary nodes normal. Resp: clear to auscultation bilaterally Cardio: regular rate and rhythm, S1, S2 normal, no murmur, click, rub or gallop GI: soft, non-tender; bowel sounds normal; no masses,  no organomegaly Extremities: extremities normal, atraumatic, no cyanosis or edema and Papular rash on the inner side of the thigh and leg secondary to recent shingles Neurologic: Alert and oriented X 3, normal strength and tone. Normal symmetric reflexes. Normal coordination and gait    ECOG PERFORMANCE STATUS: 1 - Symptomatic but completely ambulatory  Blood pressure 145/74, pulse 94, temperature 97.9 F (36.6 C), temperature source Oral, resp. rate 18, height $RemoveBe'5\' 4"'jsXpAOKCE$  (1.626 m), weight 116 lb 3.2 oz (52.708 kg), SpO2 100 %.  LABORATORY DATA: Lab Results  Component Value Date   WBC 5.3 10/05/2015   HGB 12.2 10/05/2015   HCT 36.3 10/05/2015   MCV 94.7 10/05/2015   PLT 230 10/05/2015      Chemistry      Component Value  Date/Time   NA 145 10/05/2015 0850   NA 143 07/14/2014 0409   NA 145 11/09/2011 1345   K 3.2* 10/05/2015 0850   K 4.1 07/14/2014 0409   K 3.9 11/09/2011 1345   CL 106 07/14/2014 0409   CL 108* 06/03/2013 0939   CL 104 11/09/2011 1345   CO2 26 10/05/2015 0850   CO2 25 07/14/2014 0409   CO2 28 11/09/2011 1345   BUN 18.7 10/05/2015 0850   BUN 12 07/14/2014 0409   BUN 20 11/09/2011 1345   CREATININE 0.9 10/05/2015 0850   CREATININE 0.83 07/14/2014 0409   CREATININE 0.6 11/09/2011 1345      Component Value Date/Time   CALCIUM 9.1 10/05/2015 0850   CALCIUM 8.4 07/14/2014 0409   CALCIUM 8.7 11/09/2011 1345   ALKPHOS 51 10/05/2015 0850   ALKPHOS 60 07/13/2014 2023   ALKPHOS 52 11/09/2011 1345   AST 33 10/05/2015 0850   AST 24 07/13/2014 2023   AST 20 11/09/2011 1345   ALT 26 10/05/2015 0850   ALT 23 07/13/2014 2023   ALT 21 11/09/2011 1345   BILITOT 1.04 10/05/2015 0850   BILITOT 0.9 07/13/2014 2023   BILITOT 0.60 11/09/2011 1345       RADIOGRAPHIC STUDIES: Ct Chest W Contrast  10/05/2015  CLINICAL DATA:  Cancer right lung parenchyma, restaging, chemotherapy complete. EXAM: CT CHEST WITH CONTRAST TECHNIQUE: Multidetector CT imaging of the chest was performed during intravenous contrast administration. CONTRAST:  41mL OMNIPAQUE IOHEXOL 300 MG/ML  SOLN COMPARISON:  07/04/2015. FINDINGS: Mediastinum/Nodes: Mediastinal lymph nodes are not enlarged by CT size criteria. No left hilar or axillary adenopathy. Atherosclerotic calcification of the arterial vasculature, including coronary arteries. Heart size normal. No pericardial effusion. There may be slight thickening of the mid esophageal wall. Lungs/Pleura: Confluent volume loss, bronchiectasis and architectural distortion are seen in the right perihilar region. Evidence of talc pleurodesis and small fibrothorax at the base of the right hemi thorax. A vague area of faint ground-glass is seen in the apical left upper lobe (image 6,  series 5), without nodular or solid components. Subpleural nodularity in the anterior left upper lobe (image 20), stable, nonspecific. Airway is otherwise unremarkable. Upper abdomen: Visualized portion of the liver is unremarkable. Small stones layer in the gallbladder. Visualized portions of the adrenal glands, kidneys, spleen, pancreas, stomach and bowel are grossly unremarkable. No upper abdominal adenopathy. Musculoskeletal: No worrisome lytic or sclerotic lesions. There is a new mixed lytic and sclerotic lesion in  the superior aspect of the sternum, with associated fracture (sagittal image 46). IMPRESSION: 1. New mixed lytic and sclerotic lesion in the superior aspect of the sternum, with associated transverse lucency. Assuming no intervening trauma, findings are highly worrisome for metastatic disease and pathologic fracture. 2. Post treatment changes in the right hemi thorax. 3. Cholelithiasis. Electronically Signed   By: Lorin Picket M.D.   On: 10/05/2015 11:39    ASSESSMENT AND PLAN: This is a very pleasant 80 years old white female with history of stage IIIA non-small cell lung cancer status post concurrent chemoradiation and has been on treatment with Tarceva for the last 74 months with no significant evidence for disease progression.  She is tolerating her treatment well  The recent CT scan of the chest showed no evidence for disease progression except for the mixed lytic and sclerotic lesion in the superior aspect of the sternum which likely secondary to her recent trauma and fall. I discussed the scan results with the patient today. I recommended for the patient to continue her current treatment with Tarceva at the same dose. She would come back for follow-up visit in 2 months for reevaluation with repeat CBC and complaints metabolic panel. She was advised to call immediately if she has any concerning symptoms in the interval. The patient voices understanding of current disease status and  treatment options and is in agreement with the current care plan.  All questions were answered. The patient knows to call the clinic with any problems, questions or concerns. We can certainly see the patient much sooner if necessary.  Disclaimer: This note was dictated with voice recognition software. Similar sounding words can inadvertently be transcribed and may not be corrected upon review.

## 2015-10-12 NOTE — Telephone Encounter (Signed)
Gave and printed appt sched adn avs for pt for DEC

## 2015-10-31 ENCOUNTER — Encounter: Payer: Self-pay | Admitting: Internal Medicine

## 2015-10-31 NOTE — Progress Notes (Signed)
Per biologics tarceva was shipped via fedex

## 2015-11-28 ENCOUNTER — Other Ambulatory Visit: Payer: Self-pay | Admitting: Medical Oncology

## 2015-11-28 DIAGNOSIS — C3491 Malignant neoplasm of unspecified part of right bronchus or lung: Secondary | ICD-10-CM

## 2015-11-28 MED ORDER — ERLOTINIB HCL 100 MG PO TABS: 100.0000 mg | ORAL_TABLET | Freq: Every evening | ORAL | Status: DC

## 2015-11-30 ENCOUNTER — Other Ambulatory Visit: Payer: Self-pay | Admitting: Medical Oncology

## 2015-12-07 ENCOUNTER — Ambulatory Visit (HOSPITAL_BASED_OUTPATIENT_CLINIC_OR_DEPARTMENT_OTHER): Payer: Medicare Other | Admitting: Internal Medicine

## 2015-12-07 ENCOUNTER — Other Ambulatory Visit (HOSPITAL_BASED_OUTPATIENT_CLINIC_OR_DEPARTMENT_OTHER): Payer: Medicare Other

## 2015-12-07 ENCOUNTER — Encounter: Payer: Self-pay | Admitting: Internal Medicine

## 2015-12-07 ENCOUNTER — Telehealth: Payer: Self-pay | Admitting: Internal Medicine

## 2015-12-07 VITALS — BP 143/72 | HR 72 | Temp 98.3°F | Resp 18 | Ht 64.0 in | Wt 117.1 lb

## 2015-12-07 DIAGNOSIS — R5383 Other fatigue: Secondary | ICD-10-CM

## 2015-12-07 DIAGNOSIS — C3490 Malignant neoplasm of unspecified part of unspecified bronchus or lung: Secondary | ICD-10-CM

## 2015-12-07 DIAGNOSIS — C3491 Malignant neoplasm of unspecified part of right bronchus or lung: Secondary | ICD-10-CM

## 2015-12-07 DIAGNOSIS — L299 Pruritus, unspecified: Secondary | ICD-10-CM | POA: Diagnosis not present

## 2015-12-07 LAB — CBC WITH DIFFERENTIAL/PLATELET
BASO%: 0.2 % (ref 0.0–2.0)
Basophils Absolute: 0 10*3/uL (ref 0.0–0.1)
EOS ABS: 0.3 10*3/uL (ref 0.0–0.5)
EOS%: 5.4 % (ref 0.0–7.0)
HEMATOCRIT: 33.9 % — AB (ref 34.8–46.6)
HEMOGLOBIN: 11.2 g/dL — AB (ref 11.6–15.9)
LYMPH#: 0.5 10*3/uL — AB (ref 0.9–3.3)
LYMPH%: 9.8 % — AB (ref 14.0–49.7)
MCH: 31.8 pg (ref 25.1–34.0)
MCHC: 33 g/dL (ref 31.5–36.0)
MCV: 96.3 fL (ref 79.5–101.0)
MONO#: 0.5 10*3/uL (ref 0.1–0.9)
MONO%: 8.3 % (ref 0.0–14.0)
NEUT%: 76.3 % (ref 38.4–76.8)
NEUTROS ABS: 4.1 10*3/uL (ref 1.5–6.5)
PLATELETS: 225 10*3/uL (ref 145–400)
RBC: 3.52 10*6/uL — ABNORMAL LOW (ref 3.70–5.45)
RDW: 13.8 % (ref 11.2–14.5)
WBC: 5.4 10*3/uL (ref 3.9–10.3)

## 2015-12-07 LAB — COMPREHENSIVE METABOLIC PANEL
ALBUMIN: 3.2 g/dL — AB (ref 3.5–5.0)
ALT: 27 U/L (ref 0–55)
ANION GAP: 8 meq/L (ref 3–11)
AST: 32 U/L (ref 5–34)
Alkaline Phosphatase: 48 U/L (ref 40–150)
BILIRUBIN TOTAL: 0.97 mg/dL (ref 0.20–1.20)
BUN: 19 mg/dL (ref 7.0–26.0)
CALCIUM: 9 mg/dL (ref 8.4–10.4)
CHLORIDE: 109 meq/L (ref 98–109)
CO2: 27 meq/L (ref 22–29)
CREATININE: 0.8 mg/dL (ref 0.6–1.1)
EGFR: 67 mL/min/{1.73_m2} — AB (ref >90)
Glucose: 86 mg/dl (ref 70–140)
Potassium: 3.9 mEq/L (ref 3.5–5.1)
Sodium: 144 mEq/L (ref 136–145)
TOTAL PROTEIN: 5.7 g/dL — AB (ref 6.4–8.3)

## 2015-12-07 NOTE — Progress Notes (Signed)
[name] Telephone:([phone]   Fax:(336) [place], MD [place] [address]  DIAGNOSIS AND STAGE: : Stage IIIB (T2a, N3, M0) nonsmall cell lung cancer, adenocarcinoma, in a never smoker patient with positive EGFR mutation diagnosed in January 2010.   PRIOR THERAPY:  1) Status post concurrent chemoradiation with weekly carboplatin and paclitaxel; last dose given March 21, 2009.  2) Tarceva at 150 mg p.o. daily, status post approximately 48 months of treatment, discontinued secondary to persistent diarrhea.  3) status post right Pleurx catheter placement for right nonmalignant pleural effusion.  CURRENT THERAPY: Tarceva at 100 mg p.o. Daily, started 03/19/2013, status post 28 months of treatment.   CHEMOTHERAPY INTENT: Maintenance.  CURRENT # OF CHEMOTHERAPY CYCLES: 77 CURRENT ANTIEMETICS: Compazine  CURRENT SMOKING STATUS: Never smoker  ORAL CHEMOTHERAPY AND CONSENT: Tarceva  CURRENT BISPHOSPHONATES USE: None  PAIN MANAGEMENT: 5/10 low back pain and sciatica currently on naproxen.  NARCOTICS INDUCED CONSTIPATION: None  LIVING WILL AND CODE STATUS: Full code but no long-term resuscitation.   INTERVAL HISTORY: Dana [name] 80 y.o. female returns to the clinic today for followup visit. She is tolerating her current treatment with Tarceva fairly well with no significant adverse effects except for dry skin and itching. She takes sporadically for itching with some help. She denied having any significant diarrhea. She has no chest pain, cough or hemoptysis. No significant weight loss or night sweats. He has no nausea or vomiting. She has no nausea or vomiting, no fever or chills. She has repeat CBC and comprehensive metabolic panel performed earlier today and she is here for evaluation and discussion of her lab results.  MEDICAL HISTORY: Past Medical History  Diagnosis Date  . Hemorrhoid   . COPD  (chronic obstructive pulmonary disease) ([place])   . GERD (gastroesophageal reflux disease)   . History of lung cancer ONCOLOGIST--  [facility]--  LAST CT ,  NO RECURRENCE OR METS    DX JAN 2010 --  STAGE IIIA  NON-SMALL CELL ADENOCARCINOMA (RIGHT MIDDLE LOBE)---  S/P  CHEMORADIATIO THERAPY  (COMPLETE 03-21-2009)  . History of anal fissures   . Arthritis     HANDS,  WRISTS  . Osteoporosis   . Right wrist pain     MASS  . Dyspnea on exertion   . Normal cardiac stress test     2007  PER PT  . Wears glasses   . Rash, skin     RIGHT FOREARM/ HAND  . lung ca dx'd 2010    ALLERGIES:  is allergic to amoxicillin.  MEDICATIONS:  Current Outpatient Prescriptions  Medication Sig Dispense Refill  . aspirin EC 81 MG tablet Take 81 mg by mouth daily.    . Calcium Carbonate-Vitamin D 600-400 MG-UNIT per chew tablet Chew 1 tablet by mouth 2 (two) times daily.    . cholecalciferol (VITAMIN D) 1000 UNITS tablet Take 1,000 Units by mouth daily.     . Coenzyme Q10 (COQ10) 100 MG CAPS Take 1 capsule by mouth daily.     Marland Kitchen conjugated estrogens (PREMARIN) vaginal cream Place 1 Applicatorful vaginally every Sunday.    . Cyanocobalamin (B-12) 1000 MCG CAPS Take 1,000 mcg by mouth 3 (three) times daily with meals.     . erlotinib (TARCEVA) 100 MG tablet Take 1 tablet (100 mg total) by mouth every evening. Take on an empty stomach 1 hour before meals or 2 hours after 30 tablet  2  . Hydrocodone-Acetaminophen (VICODIN) 5-300 MG TABS Take 1 tablet by mouth 4 (four) times daily as needed (for pain).     . hydroxychloroquine (PLAQUENIL) 200 MG tablet Take 400 mg by mouth every morning.     . loperamide (IMODIUM A-D) 2 MG tablet Take 1 mg by mouth 4 (four) times daily as needed for diarrhea or loose stools.     Marland Kitchen loratadine (CLARITIN) 10 MG tablet Take 10 mg by mouth daily as needed for allergies.     Marland Kitchen LORazepam (ATIVAN) 0.5 MG tablet Take 1 tablet (0.5 mg total) by mouth every 8 (eight) hours as needed for anxiety.  30 tablet 0  . oxyCODONE (OXY IR/ROXICODONE) 5 MG immediate release tablet Take 1 tablet (5 mg total) by mouth every 4 (four) hours as needed for moderate pain. 30 tablet 0  . Polyethyl Glycol-Propyl Glycol (SYSTANE OP) Place 1 drop into both eyes at bedtime as needed (for dry eyes.).     Marland Kitchen psyllium (METAMUCIL) 58.6 % powder Take 1 packet by mouth daily as needed.    . Simethicone (GAS-X PO) Take 1 tablet by mouth 2 (two) times daily as needed (for gas).     Marland Kitchen tiotropium (SPIRIVA) 18 MCG inhalation capsule Place 18 mcg into inhaler and inhale every morning.      No current facility-administered medications for this visit.    SURGICAL HISTORY:  Past Surgical History  Procedure Laterality Date  . Anal fissure repair  07/09/2011    INTERNAL SPHINCTEROTOMY  . Knee arthroscopy Right 1999  . Thumb arthroscopy Left     - removed bone spur  . Thoracoscopy  01-26-2009    w/   lung/  node biopsy's  . Benign excision left breast central duct  02/1999  . Vault suspension plus cystocele repair with graft  06-13-2010  . Transthoracic echocardiogram  12-23-2008    NORMAL LV/  EF 65-70%/  MILD MR  &  TR  . Total abdominal hysterectomy w/ bilateral salpingoophorectomy  1982    W/  APPENDECTOMY  . Tonsillectomy  AS CHILD  . Excision right wrist mass  2012  . Mass excision Right 03/11/2014    Procedure: RIGHT WRIST DEEP MASS EXCISION WITH CULTURE AND BIOSPY;  Surgeon: Linna Hoff, MD;  Location: [facility];  Service: Orthopedics;  Laterality: Right;  . Chest tube insertion Right 07/14/2014    Procedure: INSERTION PLEURAL DRAINAGE CATHETER RIGHT CHEST;  Surgeon: Grace Isaac, MD;  Location: [facility];  Service: Thoracic;  Laterality: Right;  . Talc pleurodesis Right 09/16/2014    Procedure: TALC PLEURADESIS/ slurry;  Surgeon: Grace Isaac, MD;  Location: [facility];  Service: Thoracic;  Laterality: Right;  . Removal of pleural drainage catheter Right 10/14/2014    Procedure: REMOVAL OF  PLEURAL DRAINAGE CATHETER;  Surgeon: Grace Isaac, MD;  Location: [facility];  Service: Thoracic;  Laterality: Right;    REVIEW OF SYSTEMS:  A comprehensive review of systems was negative except for: Constitutional: positive for fatigue Integument/breast: positive for dryness and pruritus   PHYSICAL EXAMINATION: General appearance: alert, cooperative and no distress Head: Normocephalic, without obvious abnormality, atraumatic Neck: no adenopathy, no JVD, supple, symmetrical, trachea midline and thyroid not enlarged, symmetric, no tenderness/mass/nodules Lymph nodes: Cervical, supraclavicular, and axillary nodes normal. Resp: clear to auscultation bilaterally Cardio: regular rate and rhythm, S1, S2 normal, no murmur, click, rub or gallop GI: soft, non-tender; bowel sounds normal; no masses,  no organomegaly Extremities: extremities normal,  atraumatic, no cyanosis or edema and Papular rash on the inner side of the thigh and leg secondary to recent shingles Neurologic: Alert and oriented X 3, normal strength and tone. Normal symmetric reflexes. Normal coordination and gait    ECOG PERFORMANCE STATUS: 1 - Symptomatic but completely ambulatory  Blood pressure 143/72, pulse 72, temperature 98.3 F (36.8 C), temperature source Oral, resp. rate 18, height '5\' 4"'$  (1.626 m), weight 117 lb 1.6 oz (53.116 kg), SpO2 99 %.  LABORATORY DATA: Lab Results  Component Value Date   WBC 5.4 12/07/2015   HGB 11.2* 12/07/2015   HCT 33.9* 12/07/2015   MCV 96.3 12/07/2015   PLT 225 12/07/2015      Chemistry      Component Value Date/Time   NA 145 10/05/2015 0850   NA 143 07/14/2014 0409   NA 145 11/09/2011 1345   K 3.2* 10/05/2015 0850   K 4.1 07/14/2014 0409   K 3.9 11/09/2011 1345   CL 106 07/14/2014 0409   CL 108* 06/03/2013 0939   CL 104 11/09/2011 1345   CO2 26 10/05/2015 0850   CO2 25 07/14/2014 0409   CO2 28 11/09/2011 1345   BUN 18.7 10/05/2015 0850   BUN 12 07/14/2014 0409   BUN 20  11/09/2011 1345   CREATININE 0.9 10/05/2015 0850   CREATININE 0.83 07/14/2014 0409   CREATININE 0.6 11/09/2011 1345      Component Value Date/Time   CALCIUM 9.1 10/05/2015 0850   CALCIUM 8.4 07/14/2014 0409   CALCIUM 8.7 11/09/2011 1345   ALKPHOS 51 10/05/2015 0850   ALKPHOS 60 07/13/2014 2023   ALKPHOS 52 11/09/2011 1345   AST 33 10/05/2015 0850   AST 24 07/13/2014 2023   AST 20 11/09/2011 1345   ALT 26 10/05/2015 0850   ALT 23 07/13/2014 2023   ALT 21 11/09/2011 1345   BILITOT 1.04 10/05/2015 0850   BILITOT 0.9 07/13/2014 2023   BILITOT 0.60 11/09/2011 1345       RADIOGRAPHIC STUDIES: No results found.  ASSESSMENT AND PLAN: This is a very pleasant 80 years old white female with history of stage IIIA non-small cell lung cancer status post concurrent chemoradiation and has been on treatment with Tarceva for the last 76 months with no significant evidence for disease progression.  She is tolerating her treatment well except for mild fatigue and itching. I recommended for the patient to continue her current treatment with Tarceva at the same dose. She would come back for follow-up visit in 2 months for reevaluation with repeat CBC and complaints metabolic panel. She was advised to call immediately if she has any concerning symptoms in the interval. The patient voices understanding of current disease status and treatment options and is in agreement with the current care plan.  All questions were answered. The patient knows to call the clinic with any problems, questions or concerns. We can certainly see the patient much sooner if necessary.  Disclaimer: This note was dictated with voice recognition software. Similar sounding words can inadvertently be transcribed and may not be corrected upon review.

## 2015-12-07 NOTE — Telephone Encounter (Signed)
Gave and printed appt sched appt sched and avs for pt for Feb 2017

## 2016-01-04 ENCOUNTER — Encounter: Payer: Self-pay | Admitting: Internal Medicine

## 2016-01-04 NOTE — Progress Notes (Signed)
Per biologics tarceva was shipped via fedex 01/03/16

## 2016-01-30 ENCOUNTER — Encounter: Payer: Self-pay | Admitting: Internal Medicine

## 2016-01-30 NOTE — Progress Notes (Signed)
Per biologics tarceva was shipped via fedex 01/27/16

## 2016-02-01 ENCOUNTER — Ambulatory Visit (HOSPITAL_BASED_OUTPATIENT_CLINIC_OR_DEPARTMENT_OTHER): Payer: Medicare Other | Admitting: Internal Medicine

## 2016-02-01 ENCOUNTER — Other Ambulatory Visit (HOSPITAL_BASED_OUTPATIENT_CLINIC_OR_DEPARTMENT_OTHER): Payer: Medicare Other

## 2016-02-01 ENCOUNTER — Encounter: Payer: Self-pay | Admitting: Internal Medicine

## 2016-02-01 ENCOUNTER — Telehealth: Payer: Self-pay | Admitting: Internal Medicine

## 2016-02-01 VITALS — BP 136/79 | HR 98 | Temp 98.5°F | Resp 18 | Ht 64.0 in | Wt 119.3 lb

## 2016-02-01 DIAGNOSIS — R197 Diarrhea, unspecified: Secondary | ICD-10-CM

## 2016-02-01 DIAGNOSIS — C3491 Malignant neoplasm of unspecified part of right bronchus or lung: Secondary | ICD-10-CM

## 2016-02-01 DIAGNOSIS — R5383 Other fatigue: Secondary | ICD-10-CM

## 2016-02-01 DIAGNOSIS — C3490 Malignant neoplasm of unspecified part of unspecified bronchus or lung: Secondary | ICD-10-CM | POA: Diagnosis not present

## 2016-02-01 DIAGNOSIS — R609 Edema, unspecified: Secondary | ICD-10-CM | POA: Diagnosis not present

## 2016-02-01 DIAGNOSIS — R21 Rash and other nonspecific skin eruption: Secondary | ICD-10-CM | POA: Diagnosis not present

## 2016-02-01 LAB — COMPREHENSIVE METABOLIC PANEL
ALT: 27 U/L (ref 0–55)
ANION GAP: 9 meq/L (ref 3–11)
AST: 32 U/L (ref 5–34)
Albumin: 3.3 g/dL — ABNORMAL LOW (ref 3.5–5.0)
Alkaline Phosphatase: 54 U/L (ref 40–150)
BILIRUBIN TOTAL: 0.94 mg/dL (ref 0.20–1.20)
BUN: 15.4 mg/dL (ref 7.0–26.0)
CALCIUM: 8.8 mg/dL (ref 8.4–10.4)
CO2: 26 meq/L (ref 22–29)
CREATININE: 0.9 mg/dL (ref 0.6–1.1)
Chloride: 109 mEq/L (ref 98–109)
EGFR: 64 mL/min/{1.73_m2} — ABNORMAL LOW (ref >90)
Glucose: 74 mg/dl (ref 70–140)
Potassium: 3.6 mEq/L (ref 3.5–5.1)
Sodium: 144 mEq/L (ref 136–145)
TOTAL PROTEIN: 6 g/dL — AB (ref 6.4–8.3)

## 2016-02-01 LAB — CBC WITH DIFFERENTIAL/PLATELET
BASO%: 0.2 % (ref 0.0–2.0)
BASOS ABS: 0 10*3/uL (ref 0.0–0.1)
EOS ABS: 0.3 10*3/uL (ref 0.0–0.5)
EOS%: 6.8 % (ref 0.0–7.0)
HEMATOCRIT: 34.6 % — AB (ref 34.8–46.6)
HGB: 11.7 g/dL (ref 11.6–15.9)
LYMPH%: 7.9 % — ABNORMAL LOW (ref 14.0–49.7)
MCH: 31.9 pg (ref 25.1–34.0)
MCHC: 33.8 g/dL (ref 31.5–36.0)
MCV: 94.3 fL (ref 79.5–101.0)
MONO#: 0.4 10*3/uL (ref 0.1–0.9)
MONO%: 9.7 % (ref 0.0–14.0)
NEUT%: 75.4 % (ref 38.4–76.8)
NEUTROS ABS: 3.4 10*3/uL (ref 1.5–6.5)
PLATELETS: 242 10*3/uL (ref 145–400)
RBC: 3.67 10*6/uL — AB (ref 3.70–5.45)
RDW: 13.7 % (ref 11.2–14.5)
WBC: 4.6 10*3/uL (ref 3.9–10.3)
lymph#: 0.4 10*3/uL — ABNORMAL LOW (ref 0.9–3.3)

## 2016-02-01 NOTE — Progress Notes (Signed)
[place] Telephone:(336) [phone]   Fax:(336) Fowler, MD [place] [address]  DIAGNOSIS AND STAGE: : Stage IIIB (T2a, N3, M0) nonsmall cell lung cancer, adenocarcinoma, in a never smoker patient with positive EGFR mutation diagnosed in January 2010.   PRIOR THERAPY:  1) Status post concurrent chemoradiation with weekly carboplatin and paclitaxel; last dose given March 21, 2009.  2) Tarceva at 150 mg p.o. daily, status post approximately 48 months of treatment, discontinued secondary to persistent diarrhea.  3) status post right Pleurx catheter placement for right nonmalignant pleural effusion.  CURRENT THERAPY: Tarceva at 100 mg p.o. Daily, started 03/19/2013, status post 30 months of treatment.   CHEMOTHERAPY INTENT: Maintenance.  CURRENT # OF CHEMOTHERAPY CYCLES: 79 CURRENT ANTIEMETICS: Compazine  CURRENT SMOKING STATUS: Never smoker  ORAL CHEMOTHERAPY AND CONSENT: Tarceva  CURRENT BISPHOSPHONATES USE: None  PAIN MANAGEMENT: 5/10 low back pain and sciatica currently on naproxen.  NARCOTICS INDUCED CONSTIPATION: None  LIVING WILL AND CODE STATUS: Full code but no long-term resuscitation.   INTERVAL HISTORY: Dana [name] 81 y.o. female returns to the clinic today for two-month followup visit. She is tolerating her current treatment with Tarceva fairly well with no significant adverse effects except for dry skin and itching. She also has swelling of the right wrist and she was seen by a rheumatologist recently. She is considering her for a small dose methotrexate. She continues to have intermittent episodes of diarrhea. She has no chest pain, cough or hemoptysis. No significant weight loss or night sweats. He has no nausea or vomiting. She has no nausea or vomiting, no fever or chills. She has repeat CBC and comprehensive metabolic panel performed earlier today and she is here for evaluation and  discussion of her lab results.  MEDICAL HISTORY: Past Medical History  Diagnosis Date  . Hemorrhoid   . COPD (chronic obstructive pulmonary disease) ([place])   . GERD (gastroesophageal reflux disease)   . History of lung cancer ONCOLOGIST--  DR [facility]--  LAST CT ,  NO RECURRENCE OR METS    DX JAN 2010 --  STAGE IIIA  NON-SMALL CELL ADENOCARCINOMA (RIGHT MIDDLE LOBE)---  S/P  CHEMORADIATIO THERAPY  (COMPLETE 03-21-2009)  . History of anal fissures   . Arthritis     HANDS,  WRISTS  . Osteoporosis   . Right wrist pain     MASS  . Dyspnea on exertion   . Normal cardiac stress test     2007  PER PT  . Wears glasses   . Rash, skin     RIGHT FOREARM/ HAND  . lung ca dx'd 2010    ALLERGIES:  is allergic to amoxicillin.  MEDICATIONS:  Current Outpatient Prescriptions  Medication Sig Dispense Refill  . aspirin EC 81 MG tablet Take 81 mg by mouth daily.    . Calcium Carbonate-Vitamin D 600-400 MG-UNIT per chew tablet Chew 1 tablet by mouth 2 (two) times daily.    . cholecalciferol (VITAMIN D) 1000 UNITS tablet Take 1,000 Units by mouth daily.     . Coenzyme Q10 (COQ10) 100 MG CAPS Take 1 capsule by mouth daily.     . Cyanocobalamin (B-12) 1000 MCG CAPS Take 1,000 mcg by mouth 3 (three) times daily with meals.     . erlotinib (TARCEVA) 100 MG tablet Take 1 tablet (100 mg total) by mouth every evening. Take on an empty stomach 1 hour before meals or  2 hours after 30 tablet 2  . Hydrocodone-Acetaminophen (VICODIN) 5-300 MG TABS Take 1 tablet by mouth 4 (four) times daily as needed (for pain).     . hydroxychloroquine (PLAQUENIL) 200 MG tablet Take 400 mg by mouth every morning.     . loperamide (IMODIUM A-D) 2 MG tablet Take 1 mg by mouth 4 (four) times daily as needed for diarrhea or loose stools.     Marland Kitchen loratadine (CLARITIN) 10 MG tablet Take 10 mg by mouth daily as needed for allergies.     Marland Kitchen LORazepam (ATIVAN) 0.5 MG tablet Take 1 tablet (0.5 mg total) by mouth every 8 (eight) hours as  needed for anxiety. 30 tablet 0  . oxyCODONE (OXY IR/ROXICODONE) 5 MG immediate release tablet Take 1 tablet (5 mg total) by mouth every 4 (four) hours as needed for moderate pain. 30 tablet 0  . Polyethyl Glycol-Propyl Glycol (SYSTANE OP) Place 1 drop into both eyes at bedtime as needed (for dry eyes.).     Marland Kitchen psyllium (METAMUCIL) 58.6 % powder Take 1 packet by mouth daily as needed.    . Simethicone (GAS-X PO) Take 1 tablet by mouth 2 (two) times daily as needed (for gas).     Marland Kitchen tiotropium (SPIRIVA) 18 MCG inhalation capsule Place 18 mcg into inhaler and inhale every morning.      No current facility-administered medications for this visit.    SURGICAL HISTORY:  Past Surgical History  Procedure Laterality Date  . Anal fissure repair  07/09/2011    INTERNAL SPHINCTEROTOMY  . Knee arthroscopy Right 1999  . Thumb arthroscopy Left     - removed bone spur  . Thoracoscopy  01-26-2009    w/   lung/  node biopsy's  . Benign excision left breast central duct  02/1999  . Vault suspension plus cystocele repair with graft  06-13-2010  . Transthoracic echocardiogram  12-23-2008    NORMAL LV/  EF 65-70%/  MILD MR  &  TR  . Total abdominal hysterectomy w/ bilateral salpingoophorectomy  1982    W/  APPENDECTOMY  . Tonsillectomy  AS CHILD  . Excision right wrist mass  2012  . Mass excision Right 03/11/2014    Procedure: RIGHT WRIST DEEP MASS EXCISION WITH CULTURE AND BIOSPY;  Surgeon: Linna Hoff, MD;  Location: [facility];  Service: Orthopedics;  Laterality: Right;  . Chest tube insertion Right 07/14/2014    Procedure: INSERTION PLEURAL DRAINAGE CATHETER RIGHT CHEST;  Surgeon: Grace Isaac, MD;  Location: [facility];  Service: Thoracic;  Laterality: Right;  . Talc pleurodesis Right 09/16/2014    Procedure: TALC PLEURADESIS/ slurry;  Surgeon: Grace Isaac, MD;  Location: [facility];  Service: Thoracic;  Laterality: Right;  . Removal of pleural drainage catheter Right 10/14/2014     Procedure: REMOVAL OF PLEURAL DRAINAGE CATHETER;  Surgeon: Grace Isaac, MD;  Location: [facility];  Service: Thoracic;  Laterality: Right;    REVIEW OF SYSTEMS:  A comprehensive review of systems was negative except for: Constitutional: positive for fatigue Gastrointestinal: positive for diarrhea Integument/breast: positive for dryness   PHYSICAL EXAMINATION: General appearance: alert, cooperative and no distress Head: Normocephalic, without obvious abnormality, atraumatic Neck: no adenopathy, no JVD, supple, symmetrical, trachea midline and thyroid not enlarged, symmetric, no tenderness/mass/nodules Lymph nodes: Cervical, supraclavicular, and axillary nodes normal. Resp: clear to auscultation bilaterally Cardio: regular rate and rhythm, S1, S2 normal, no murmur, click, rub or gallop GI: soft, non-tender; bowel sounds normal; no  masses,  no organomegaly Extremities: extremities normal, atraumatic, no cyanosis or edema and Papular rash on the inner side of the thigh and leg secondary to recent shingles Neurologic: Alert and oriented X 3, normal strength and tone. Normal symmetric reflexes. Normal coordination and gait    ECOG PERFORMANCE STATUS: 1 - Symptomatic but completely ambulatory  Blood pressure 136/79, pulse 98, temperature 98.5 F (36.9 C), temperature source Oral, resp. rate 18, height '5\' 4"'$  (1.626 m), weight 119 lb 4.8 oz (54.114 kg), SpO2 98 %.  LABORATORY DATA: Lab Results  Component Value Date   WBC 4.6 02/01/2016   HGB 11.7 02/01/2016   HCT 34.6* 02/01/2016   MCV 94.3 02/01/2016   PLT 242 02/01/2016      Chemistry      Component Value Date/Time   NA 144 02/01/2016 0932   NA 143 07/14/2014 0409   NA 145 11/09/2011 1345   K 3.6 02/01/2016 0932   K 4.1 07/14/2014 0409   K 3.9 11/09/2011 1345   CL 106 07/14/2014 0409   CL 108* 06/03/2013 0939   CL 104 11/09/2011 1345   CO2 26 02/01/2016 0932   CO2 25 07/14/2014 0409   CO2 28 11/09/2011 1345   BUN 15.4  02/01/2016 0932   BUN 12 07/14/2014 0409   BUN 20 11/09/2011 1345   CREATININE 0.9 02/01/2016 0932   CREATININE 0.83 07/14/2014 0409   CREATININE 0.6 11/09/2011 1345      Component Value Date/Time   CALCIUM 8.8 02/01/2016 0932   CALCIUM 8.4 07/14/2014 0409   CALCIUM 8.7 11/09/2011 1345   ALKPHOS 54 02/01/2016 0932   ALKPHOS 60 07/13/2014 2023   ALKPHOS 52 11/09/2011 1345   AST 32 02/01/2016 0932   AST 24 07/13/2014 2023   AST 20 11/09/2011 1345   ALT 27 02/01/2016 0932   ALT 23 07/13/2014 2023   ALT 21 11/09/2011 1345   BILITOT 0.94 02/01/2016 0932   BILITOT 0.9 07/13/2014 2023   BILITOT 0.60 11/09/2011 1345       RADIOGRAPHIC STUDIES: No results found.  ASSESSMENT AND PLAN: This is a very pleasant 81 years old white female with history of stage IIIA non-small cell lung cancer status post concurrent chemoradiation and has been on treatment with Tarceva for the last 78 months with no significant evidence for disease progression.  She is tolerating her treatment well except for mild fatigue, skin rash, itching and intermittent episodes of diarrhea. For the arthritis and swelling of the right wrist, I explained to the patient that it is okay to proceed with treatment with low-dose methotrexate and we will continue to monitor her disease closely. I recommended for the patient to continue her current treatment with Tarceva at the same dose. She would come back for follow-up visit in 2 months for reevaluation with repeat CBC and comprehensive metabolic panel as well as CT scan of the chest for restaging of her disease. She was advised to call immediately if she has any concerning symptoms in the interval. The patient voices understanding of current disease status and treatment options and is in agreement with the current care plan.  All questions were answered. The patient knows to call the clinic with any problems, questions or concerns. We can certainly see the patient much sooner  if necessary.  Disclaimer: This note was dictated with voice recognition software. Similar sounding words can inadvertently be transcribed and may not be corrected upon review.

## 2016-02-01 NOTE — Telephone Encounter (Signed)
per pof to sch pt appt-gave pt copy of avs-adv pt [facility] will call to sch scan

## 2016-02-23 ENCOUNTER — Other Ambulatory Visit: Payer: Self-pay | Admitting: *Deleted

## 2016-02-23 DIAGNOSIS — C3491 Malignant neoplasm of unspecified part of right bronchus or lung: Secondary | ICD-10-CM

## 2016-02-23 MED ORDER — ERLOTINIB HCL 100 MG PO TABS: 100.0000 mg | ORAL_TABLET | Freq: Every evening | ORAL | Status: DC

## 2016-02-23 NOTE — Telephone Encounter (Signed)
Per MD progress note pt to continue Tarceva f/u in 2 months with labs. Rx refilled e-scribed to [facility]

## 2016-02-29 ENCOUNTER — Encounter: Payer: Self-pay | Admitting: Internal Medicine

## 2016-02-29 NOTE — Progress Notes (Signed)
Per [facility] tarceva shipped via [employer] 02/27/16

## 2016-04-02 ENCOUNTER — Ambulatory Visit (HOSPITAL_COMMUNITY)
Admission: RE | Admit: 2016-04-02 | Discharge: 2016-04-02 | Disposition: A | Discharge status: Home / Self Care | Payer: Medicare Other | Source: Ambulatory Visit | Attending: Internal Medicine | Admitting: Internal Medicine

## 2016-04-02 ENCOUNTER — Encounter (HOSPITAL_COMMUNITY): Payer: Self-pay

## 2016-04-02 ENCOUNTER — Other Ambulatory Visit (HOSPITAL_BASED_OUTPATIENT_CLINIC_OR_DEPARTMENT_OTHER): Payer: Medicare Other

## 2016-04-02 DIAGNOSIS — C3491 Malignant neoplasm of unspecified part of right bronchus or lung: Secondary | ICD-10-CM | POA: Insufficient documentation

## 2016-04-02 DIAGNOSIS — R918 Other nonspecific abnormal finding of lung field: Secondary | ICD-10-CM | POA: Insufficient documentation

## 2016-04-02 DIAGNOSIS — K8020 Calculus of gallbladder without cholecystitis without obstruction: Secondary | ICD-10-CM | POA: Insufficient documentation

## 2016-04-02 DIAGNOSIS — I2510 Atherosclerotic heart disease of native coronary artery without angina pectoris: Secondary | ICD-10-CM | POA: Insufficient documentation

## 2016-04-02 DIAGNOSIS — Z8781 Personal history of (healed) traumatic fracture: Secondary | ICD-10-CM | POA: Diagnosis not present

## 2016-04-02 DIAGNOSIS — C3490 Malignant neoplasm of unspecified part of unspecified bronchus or lung: Secondary | ICD-10-CM | POA: Diagnosis not present

## 2016-04-02 LAB — COMPREHENSIVE METABOLIC PANEL
ALBUMIN: 3.5 g/dL (ref 3.5–5.0)
ALK PHOS: 62 U/L (ref 40–150)
ALT: 50 U/L (ref 0–55)
AST: 39 U/L — AB (ref 5–34)
Anion Gap: 8 mEq/L (ref 3–11)
BILIRUBIN TOTAL: 0.63 mg/dL (ref 0.20–1.20)
BUN: 17.7 mg/dL (ref 7.0–26.0)
CALCIUM: 9 mg/dL (ref 8.4–10.4)
CO2: 27 mEq/L (ref 22–29)
Chloride: 108 mEq/L (ref 98–109)
Creatinine: 0.9 mg/dL (ref 0.6–1.1)
EGFR: 62 mL/min/{1.73_m2} — ABNORMAL LOW (ref >90)
Glucose: 84 mg/dl (ref 70–140)
POTASSIUM: 3.7 meq/L (ref 3.5–5.1)
Sodium: 143 mEq/L (ref 136–145)
Total Protein: 6.1 g/dL — ABNORMAL LOW (ref 6.4–8.3)

## 2016-04-02 LAB — CBC WITH DIFFERENTIAL/PLATELET
BASO%: 0.3 % (ref 0.0–2.0)
BASOS ABS: 0 10*3/uL (ref 0.0–0.1)
EOS ABS: 0.3 10*3/uL (ref 0.0–0.5)
EOS%: 5.1 % (ref 0.0–7.0)
HEMATOCRIT: 38.6 % (ref 34.8–46.6)
HEMOGLOBIN: 12.8 g/dL (ref 11.6–15.9)
LYMPH#: 0.5 10*3/uL — AB (ref 0.9–3.3)
LYMPH%: 9.2 % — ABNORMAL LOW (ref 14.0–49.7)
MCH: 31.6 pg (ref 25.1–34.0)
MCHC: 33.1 g/dL (ref 31.5–36.0)
MCV: 95.5 fL (ref 79.5–101.0)
MONO#: 0.5 10*3/uL (ref 0.1–0.9)
MONO%: 9.1 % (ref 0.0–14.0)
NEUT#: 3.9 10*3/uL (ref 1.5–6.5)
NEUT%: 76.3 % (ref 38.4–76.8)
Platelets: 231 10*3/uL (ref 145–400)
RBC: 4.04 10*6/uL (ref 3.70–5.45)
RDW: 13.3 % (ref 11.2–14.5)
WBC: 5.1 10*3/uL (ref 3.9–10.3)

## 2016-04-02 MED ORDER — IOPAMIDOL (ISOVUE-300) INJECTION 61%
75.0000 mL | Freq: Once | INTRAVENOUS | Status: AC | PRN
  Administered 2016-04-02: 75 mL via INTRAVENOUS

## 2016-04-05 ENCOUNTER — Encounter: Payer: Self-pay | Admitting: Internal Medicine

## 2016-04-05 ENCOUNTER — Ambulatory Visit (HOSPITAL_BASED_OUTPATIENT_CLINIC_OR_DEPARTMENT_OTHER): Payer: Medicare Other | Admitting: Internal Medicine

## 2016-04-05 ENCOUNTER — Telehealth: Payer: Self-pay | Admitting: Internal Medicine

## 2016-04-05 VITALS — BP 123/68 | HR 87 | Temp 97.9°F | Resp 18 | Ht 64.0 in | Wt 116.1 lb

## 2016-04-05 DIAGNOSIS — K8020 Calculus of gallbladder without cholecystitis without obstruction: Secondary | ICD-10-CM

## 2016-04-05 DIAGNOSIS — C3490 Malignant neoplasm of unspecified part of unspecified bronchus or lung: Secondary | ICD-10-CM

## 2016-04-05 DIAGNOSIS — C3491 Malignant neoplasm of unspecified part of right bronchus or lung: Secondary | ICD-10-CM

## 2016-04-05 DIAGNOSIS — R21 Rash and other nonspecific skin eruption: Secondary | ICD-10-CM | POA: Diagnosis not present

## 2016-04-05 DIAGNOSIS — R5383 Other fatigue: Secondary | ICD-10-CM | POA: Diagnosis not present

## 2016-04-05 NOTE — Telephone Encounter (Signed)
appt sched for June no pof.Hanley Seamen pt appt & avs

## 2016-04-05 NOTE — Progress Notes (Signed)
[place] Telephone:([phone]   Fax:(336) Bradford, MD [place] [address]  DIAGNOSIS AND STAGE: : Stage IIIB (T2a, N3, M0) nonsmall cell lung cancer, adenocarcinoma, in a never smoker patient with positive EGFR mutation diagnosed in January 2010.   PRIOR THERAPY:  1) Status post concurrent chemoradiation with weekly carboplatin and paclitaxel; last dose given March 21, 2009.  2) Tarceva at 150 mg p.o. daily, status post approximately 48 months of treatment, discontinued secondary to persistent diarrhea.  3) status post right Pleurx catheter placement for right nonmalignant pleural effusion.  CURRENT THERAPY: Tarceva at 100 mg p.o. Daily, started 03/19/2013, status post 32 months of treatment.   CHEMOTHERAPY INTENT: Maintenance.  CURRENT # OF CHEMOTHERAPY CYCLES: 81 CURRENT ANTIEMETICS: Compazine  CURRENT SMOKING STATUS: Never smoker  ORAL CHEMOTHERAPY AND CONSENT: Tarceva  CURRENT BISPHOSPHONATES USE: None  PAIN MANAGEMENT: 5/10 low back pain and sciatica currently on naproxen.  NARCOTICS INDUCED CONSTIPATION: None  LIVING WILL AND CODE STATUS: Full code but no long-term resuscitation.   INTERVAL HISTORY: Dana [name] 81 y.o. female returns to the clinic today for two-month followup visit. She is tolerating her current treatment with Tarceva fairly well with no significant adverse effects except for dry skin and itching. She is scheduled to see a dermatologist at [facility] for evaluation of the persistent rash in the right arm. Over the last few weeks she also has a lot of gastrointestinal issues including bloating few episodes of diarrhea as well as nausea. She has no chest pain, cough or hemoptysis. No significant weight loss or night sweats. She has no fever or chills. She has repeat CT scan of the chest in addition to CBC and comprehensive metabolic panel performed recently and  she is here for evaluation and discussion of her lab and his scan results.  MEDICAL HISTORY: Past Medical History  Diagnosis Date  . Hemorrhoid   . COPD (chronic obstructive pulmonary disease) ([place])   . GERD (gastroesophageal reflux disease)   . History of lung cancer ONCOLOGIST--  DR [facility]--  LAST CT ,  NO RECURRENCE OR METS    DX JAN 2010 --  STAGE IIIA  NON-SMALL CELL ADENOCARCINOMA (RIGHT MIDDLE LOBE)---  S/P  CHEMORADIATIO THERAPY  (COMPLETE 03-21-2009)  . History of anal fissures   . Arthritis     HANDS,  WRISTS  . Osteoporosis   . Right wrist pain     MASS  . Dyspnea on exertion   . Normal cardiac stress test     2007  PER PT  . Wears glasses   . Rash, skin     RIGHT FOREARM/ HAND  . lung ca dx'd 2010    ALLERGIES:  is allergic to amoxicillin.  MEDICATIONS:  Current Outpatient Prescriptions  Medication Sig Dispense Refill  . aspirin EC 81 MG tablet Take 81 mg by mouth daily.    . Calcium Carbonate-Vitamin D 600-400 MG-UNIT per chew tablet Chew 1 tablet by mouth 2 (two) times daily.    . cholecalciferol (VITAMIN D) 1000 UNITS tablet Take 1,000 Units by mouth daily.     . Coenzyme Q10 (COQ10) 100 MG CAPS Take 1 capsule by mouth daily.     . Cyanocobalamin (B-12) 1000 MCG CAPS Take 1,000 mcg by mouth 3 (three) times daily with meals.     . erlotinib (TARCEVA) 100 MG tablet Take 1 tablet (100 mg total) by mouth every  evening. Take on an empty stomach 1 hour before meals or 2 hours after 30 tablet 2  . Hydrocodone-Acetaminophen (VICODIN) 5-300 MG TABS Take 1 tablet by mouth 4 (four) times daily as needed (for pain).     . hydroxychloroquine (PLAQUENIL) 200 MG tablet Take 400 mg by mouth every morning.     . loperamide (IMODIUM A-D) 2 MG tablet Take 1 mg by mouth 4 (four) times daily as needed for diarrhea or loose stools.     Marland Kitchen loratadine (CLARITIN) 10 MG tablet Take 10 mg by mouth daily as needed for allergies.     Marland Kitchen LORazepam (ATIVAN) 0.5 MG tablet Take 1 tablet (0.5 mg  total) by mouth every 8 (eight) hours as needed for anxiety. 30 tablet 0  . oxyCODONE (OXY IR/ROXICODONE) 5 MG immediate release tablet Take 1 tablet (5 mg total) by mouth every 4 (four) hours as needed for moderate pain. 30 tablet 0  . Polyethyl Glycol-Propyl Glycol (SYSTANE OP) Place 1 drop into both eyes at bedtime as needed (for dry eyes.).     Marland Kitchen psyllium (METAMUCIL) 58.6 % powder Take 1 packet by mouth daily as needed.    . Simethicone (GAS-X PO) Take 1 tablet by mouth 2 (two) times daily as needed (for gas).     Marland Kitchen tiotropium (SPIRIVA) 18 MCG inhalation capsule Place 18 mcg into inhaler and inhale every morning.      No current facility-administered medications for this visit.    SURGICAL HISTORY:  Past Surgical History  Procedure Laterality Date  . Anal fissure repair  07/09/2011    INTERNAL SPHINCTEROTOMY  . Knee arthroscopy Right 1999  . Thumb arthroscopy Left     - removed bone spur  . Thoracoscopy  01-26-2009    w/   lung/  node biopsy's  . Benign excision left breast central duct  02/1999  . Vault suspension plus cystocele repair with graft  06-13-2010  . Transthoracic echocardiogram  12-23-2008    NORMAL LV/  EF 65-70%/  MILD MR  &  TR  . Total abdominal hysterectomy w/ bilateral salpingoophorectomy  1982    W/  APPENDECTOMY  . Tonsillectomy  AS CHILD  . Excision right wrist mass  2012  . Mass excision Right 03/11/2014    Procedure: RIGHT WRIST DEEP MASS EXCISION WITH CULTURE AND BIOSPY;  Surgeon: Linna Hoff, MD;  Location: [facility];  Service: Orthopedics;  Laterality: Right;  . Chest tube insertion Right 07/14/2014    Procedure: INSERTION PLEURAL DRAINAGE CATHETER RIGHT CHEST;  Surgeon: Grace Isaac, MD;  Location: [facility];  Service: Thoracic;  Laterality: Right;  . Talc pleurodesis Right 09/16/2014    Procedure: TALC PLEURADESIS/ slurry;  Surgeon: Grace Isaac, MD;  Location: [place];  Service: Thoracic;  Laterality: Right;  . Removal of pleural  drainage catheter Right 10/14/2014    Procedure: REMOVAL OF PLEURAL DRAINAGE CATHETER;  Surgeon: Grace Isaac, MD;  Location: [place];  Service: Thoracic;  Laterality: Right;    REVIEW OF SYSTEMS:  Constitutional: positive for fatigue Eyes: negative Ears, nose, mouth, throat, and face: negative Respiratory: negative Cardiovascular: negative Gastrointestinal: positive for change in bowel habits, diarrhea and nausea Genitourinary:negative Integument/breast: negative Hematologic/lymphatic: negative Musculoskeletal:negative Neurological: negative Behavioral/Psych: negative Endocrine: negative Allergic/Immunologic: negative   PHYSICAL EXAMINATION: General appearance: alert, cooperative and no distress Head: Normocephalic, without obvious abnormality, atraumatic Neck: no adenopathy, no JVD, supple, symmetrical, trachea midline and thyroid not enlarged, symmetric, no tenderness/mass/nodules Lymph nodes: Cervical,  supraclavicular, and axillary nodes normal. Resp: clear to auscultation bilaterally Cardio: regular rate and rhythm, S1, S2 normal, no murmur, click, rub or gallop GI: soft, non-tender; bowel sounds normal; no masses,  no organomegaly Extremities: extremities normal, atraumatic, no cyanosis or edema and Papular rash on the inner side of the thigh and leg secondary to recent shingles Neurologic: Alert and oriented X 3, normal strength and tone. Normal symmetric reflexes. Normal coordination and gait    ECOG PERFORMANCE STATUS: 1 - Symptomatic but completely ambulatory  Blood pressure 123/68, pulse 87, temperature 97.9 F (36.6 C), temperature source Oral, resp. rate 18, height _0  (1.626 m), weight 116 lb 1.6 oz (52.663 kg), SpO2 98 %.  LABORATORY DATA: Lab Results  Component Value Date   WBC 5.1 04/02/2016   HGB 12.8 04/02/2016   HCT 38.6 04/02/2016   MCV 95.5 04/02/2016   PLT 231 04/02/2016      Chemistry      Component Value Date/Time   NA 143 04/02/2016 0926     NA 143 07/14/2014 0409   NA 145 11/09/2011 1345   K 3.7 04/02/2016 0926   K 4.1 07/14/2014 0409   K 3.9 11/09/2011 1345   CL 106 07/14/2014 0409   CL 108* 06/03/2013 0939   CL 104 11/09/2011 1345   CO2 27 04/02/2016 0926   CO2 25 07/14/2014 0409   CO2 28 11/09/2011 1345   BUN 17.7 04/02/2016 0926   BUN 12 07/14/2014 0409   BUN 20 11/09/2011 1345   CREATININE 0.9 04/02/2016 0926   CREATININE 0.83 07/14/2014 0409   CREATININE 0.6 11/09/2011 1345      Component Value Date/Time   CALCIUM 9.0 04/02/2016 0926   CALCIUM 8.4 07/14/2014 0409   CALCIUM 8.7 11/09/2011 1345   ALKPHOS 62 04/02/2016 0926   ALKPHOS 60 07/13/2014 2023   ALKPHOS 52 11/09/2011 1345   AST 39* 04/02/2016 0926   AST 24 07/13/2014 2023   AST 20 11/09/2011 1345   ALT 50 04/02/2016 0926   ALT 23 07/13/2014 2023   ALT 21 11/09/2011 1345   BILITOT 0.63 04/02/2016 0926   BILITOT 0.9 07/13/2014 2023   BILITOT 0.60 11/09/2011 1345       RADIOGRAPHIC STUDIES: Ct Chest W Contrast  04/02/2016  CLINICAL DATA:  Right lung cancer, chemotherapy and radiation therapy complete. Tarceva in progress. Shortness of breath on exertion, chronic. EXAM: CT CHEST WITH CONTRAST TECHNIQUE: Multidetector CT imaging of the chest was performed during intravenous contrast administration. CONTRAST:  23m ISOVUE-300 IOPAMIDOL (ISOVUE-300) INJECTION 61% COMPARISON:  10/05/2015. FINDINGS: Mediastinum/Nodes: Subcarinal lymph nodes measure up to 10 mm (2/48), stable. No hilar or axillary adenopathy. Atherosclerotic calcification of the arterial vasculature, including coronary arteries. Heart size normal. No pericardial effusion. Lungs/Pleura: Post treatment volume loss and bronchiectasis are seen in the medial and lower right hemi thorax, stable. Minimal pleural thickening at the base the right hemi thorax with evidence of talc pleurodesis. Somewhat amorphous ground-glass in the apical left upper lobe (5/15), unchanged. Subpleural ground-glass  nodule in the anterior left upper lobe measures 9 mm (8 x 9 mm) on series 5, image 39, stable. No pleural fluid. Airway is unremarkable. Upper abdomen: Visualized portion of the liver is unremarkable. Stones in the gallbladder. Visualized portions of the adrenal glands, kidneys, spleen, pancreas, stomach and bowel are grossly unremarkable. Retro aortic left renal vein. Musculoskeletal: No worrisome lytic or sclerotic lesions. Degenerative changes are seen in the spine. Healed sternal fracture. IMPRESSION: 1. Post treatment changes  in the right hemi thorax, stable. No evidence of metastatic disease. 2. Areas of ground-glass nodularity in the left upper lobe, stable. Continued attention on followup exams is warranted. 3. Coronary artery calcification. 4. Cholelithiasis. 5. Healed sternal fracture. Patient reportedly had a history of a fall onto her chest prior to the previous exam of 10/05/2015, per Dr. Worthy Flank clinic note on 10/12/2015. Electronically Signed   By: Lorin Picket M.D.   On: 04/02/2016 12:20    ASSESSMENT AND PLAN: This is a very pleasant 81 years old white female with history of stage IIIA non-small cell lung cancer status post concurrent chemoradiation and has been on treatment with Tarceva for the last 80 months with no significant evidence for disease progression.  She is tolerating her treatment well except for mild fatigue, skin rash, itching and intermittent episodes of diarrhea and recently abdominal pain with mild nausea and bloating. The recent CT scan of the chest showed no evidence for disease progression. I discussed the scan results with the patient today and recommended for her to continue her treatment with Tarceva at the same dose. The scan also showed cholelithiasis which may explain some of her gastrointestinal symptoms. I recommended for her to get referred from her primary care physician to see gastroenterologist for further evaluation of her condition. She would come  back for follow-up visit in 2 months for reevaluation with repeat CBC and comprehensive metabolic panel. She was advised to call immediately if she has any concerning symptoms in the interval. The patient voices understanding of current disease status and treatment options and is in agreement with the current care plan.  All questions were answered. The patient knows to call the clinic with any problems, questions or concerns. We can certainly see the patient much sooner if necessary.  Disclaimer: This note was dictated with voice recognition software. Similar sounding words can inadvertently be transcribed and may not be corrected upon review.

## 2016-04-18 ENCOUNTER — Other Ambulatory Visit: Payer: Self-pay

## 2016-04-18 DIAGNOSIS — Z1231 Encounter for screening mammogram for malignant neoplasm of breast: Secondary | ICD-10-CM

## 2016-04-30 ENCOUNTER — Encounter: Payer: Self-pay | Admitting: Internal Medicine

## 2016-04-30 NOTE — Progress Notes (Signed)
Per [facility] tarceva was shipped via fed ex 04/27/16

## 2016-05-14 ENCOUNTER — Ambulatory Visit
Admission: RE | Admit: 2016-05-14 | Discharge: 2016-05-14 | Disposition: A | Discharge status: Home / Self Care | Payer: Medicare Other | Source: Ambulatory Visit

## 2016-05-14 DIAGNOSIS — Z1231 Encounter for screening mammogram for malignant neoplasm of breast: Secondary | ICD-10-CM

## 2016-05-24 ENCOUNTER — Other Ambulatory Visit: Payer: Self-pay | Admitting: Medical Oncology

## 2016-05-24 DIAGNOSIS — C3491 Malignant neoplasm of unspecified part of right bronchus or lung: Secondary | ICD-10-CM

## 2016-05-24 MED ORDER — ERLOTINIB HCL 100 MG PO TABS: 100.0000 mg | ORAL_TABLET | Freq: Every evening | ORAL | Status: DC

## 2016-06-06 ENCOUNTER — Encounter: Payer: Self-pay | Admitting: Internal Medicine

## 2016-06-06 ENCOUNTER — Other Ambulatory Visit (HOSPITAL_BASED_OUTPATIENT_CLINIC_OR_DEPARTMENT_OTHER): Payer: Medicare Other

## 2016-06-06 ENCOUNTER — Ambulatory Visit (HOSPITAL_BASED_OUTPATIENT_CLINIC_OR_DEPARTMENT_OTHER): Payer: Medicare Other | Admitting: Internal Medicine

## 2016-06-06 ENCOUNTER — Telehealth: Payer: Self-pay | Admitting: Internal Medicine

## 2016-06-06 VITALS — BP 123/71 | HR 84 | Temp 97.7°F | Resp 18 | Wt 114.5 lb

## 2016-06-06 DIAGNOSIS — R0602 Shortness of breath: Secondary | ICD-10-CM

## 2016-06-06 DIAGNOSIS — C3491 Malignant neoplasm of unspecified part of right bronchus or lung: Secondary | ICD-10-CM

## 2016-06-06 DIAGNOSIS — R197 Diarrhea, unspecified: Secondary | ICD-10-CM

## 2016-06-06 DIAGNOSIS — R21 Rash and other nonspecific skin eruption: Secondary | ICD-10-CM | POA: Diagnosis not present

## 2016-06-06 DIAGNOSIS — R5383 Other fatigue: Secondary | ICD-10-CM

## 2016-06-06 LAB — COMPREHENSIVE METABOLIC PANEL
ALBUMIN: 3.2 g/dL — AB (ref 3.5–5.0)
ALK PHOS: 47 U/L (ref 40–150)
ALT: 24 U/L (ref 0–55)
AST: 30 U/L (ref 5–34)
Anion Gap: 9 mEq/L (ref 3–11)
BUN: 17.8 mg/dL (ref 7.0–26.0)
CALCIUM: 9 mg/dL (ref 8.4–10.4)
CHLORIDE: 107 meq/L (ref 98–109)
CO2: 27 meq/L (ref 22–29)
Creatinine: 0.9 mg/dL (ref 0.6–1.1)
EGFR: 62 mL/min/{1.73_m2} — AB (ref >90)
GLUCOSE: 78 mg/dL (ref 70–140)
POTASSIUM: 3.6 meq/L (ref 3.5–5.1)
Sodium: 142 mEq/L (ref 136–145)
TOTAL PROTEIN: 5.7 g/dL — AB (ref 6.4–8.3)
Total Bilirubin: 1.12 mg/dL (ref 0.20–1.20)

## 2016-06-06 LAB — CBC WITH DIFFERENTIAL/PLATELET
BASO%: 0.4 % (ref 0.0–2.0)
Basophils Absolute: 0 10*3/uL (ref 0.0–0.1)
EOS ABS: 0.3 10*3/uL (ref 0.0–0.5)
EOS%: 6.7 % (ref 0.0–7.0)
HEMATOCRIT: 35.7 % (ref 34.8–46.6)
HEMOGLOBIN: 12.2 g/dL (ref 11.6–15.9)
LYMPH#: 0.5 10*3/uL — AB (ref 0.9–3.3)
LYMPH%: 9.8 % — AB (ref 14.0–49.7)
MCH: 31.8 pg (ref 25.1–34.0)
MCHC: 34.2 g/dL (ref 31.5–36.0)
MCV: 93 fL (ref 79.5–101.0)
MONO#: 0.4 10*3/uL (ref 0.1–0.9)
MONO%: 9 % (ref 0.0–14.0)
NEUT%: 74.1 % (ref 38.4–76.8)
NEUTROS ABS: 3.6 10*3/uL (ref 1.5–6.5)
Platelets: 221 10*3/uL (ref 145–400)
RBC: 3.84 10*6/uL (ref 3.70–5.45)
RDW: 13.6 % (ref 11.2–14.5)
WBC: 4.9 10*3/uL (ref 3.9–10.3)

## 2016-06-06 NOTE — Progress Notes (Signed)
[place] [phone]   Fax:([phone]) [place], [place] [address]  DIAGNOSIS AND STAGE: : Stage IIIB (T2a, N3, M0) nonsmall cell lung cancer, adenocarcinoma, in a never smoker patient with positive EGFR mutation diagnosed in January 2010.   PRIOR THERAPY:  1) Status post concurrent chemoradiation with weekly carboplatin and paclitaxel; last dose given March 21, 2009.  2) Tarceva at 150 mg p.o. daily, status post approximately 48 months of treatment, discontinued secondary to persistent diarrhea.  3) status post right Pleurx catheter placement for right nonmalignant pleural effusion.  CURRENT THERAPY: Tarceva at 100 mg p.o. Daily, started 03/19/2013, status post 34 months of treatment.   CHEMOTHERAPY INTENT: Maintenance.  CURRENT # OF CHEMOTHERAPY CYCLES: 83 CURRENT ANTIEMETICS: Compazine  CURRENT SMOKING STATUS: Never smoker  ORAL CHEMOTHERAPY AND CONSENT: Tarceva  CURRENT BISPHOSPHONATES USE: None  PAIN MANAGEMENT: 5/10 low back pain and sciatica currently on naproxen.  NARCOTICS INDUCED CONSTIPATION: None  LIVING WILL AND CODE STATUS: Full code but no long-term resuscitation.   INTERVAL HISTORY: Dana [name] 81 y.o. female returns to the clinic today for two-month followup visit. She is tolerating her current treatment with Tarceva fairly well with no significant adverse effects except for dry skin and itching. She was seen by a dermatologist at [facility] for evaluation of the persistent rash in the right arm. The etiology of her rash is unclear at this point. She has been complaining of increasing fatigue recently. She also has shortness breath with exertion. She has no chest pain, cough or hemoptysis. No significant weight loss or night sweats. She has no fever or chills. She has repeat CBC, complaints metabolic panel performed earlier today and she is here for evaluation  and discussion of her lab results.  MEDICAL HISTORY: Past Medical History  Diagnosis Date  . Hemorrhoid   . COPD (chronic obstructive pulmonary disease) ([place])   . GERD (gastroesophageal reflux disease)   . History of lung cancer ONCOLOGIST--  [facility]--  LAST CT ,  NO RECURRENCE OR METS    DX JAN 2010 --  STAGE IIIA  NON-SMALL CELL ADENOCARCINOMA (RIGHT MIDDLE LOBE)---  S/P  CHEMORADIATIO THERAPY  (COMPLETE 03-21-2009)  . History of anal fissures   . Arthritis     HANDS,  WRISTS  . Osteoporosis   . Right wrist pain     MASS  . Dyspnea on exertion   . Normal cardiac stress test     2007  PER PT  . Wears glasses   . Rash, skin     RIGHT FOREARM/ HAND  . lung ca dx'd 2010    ALLERGIES:  is allergic to amoxicillin.  MEDICATIONS:  Current Outpatient Prescriptions  Medication Sig Dispense Refill  . aspirin EC 81 MG tablet Take 81 mg by mouth daily.    Marland Kitchen bismuth subsalicylate (PEPTO BISMOL) 262 MG/15ML suspension Take 30 mLs by mouth every three (3) days as needed.    . Calcium Carbonate-Vitamin D 600-400 MG-UNIT per chew tablet Chew 1 tablet by mouth 2 (two) times daily.    . cholecalciferol (VITAMIN D) 1000 UNITS tablet Take 1,000 Units by mouth daily.     . Coenzyme Q10 (COQ10) 100 MG CAPS Take 1 capsule by mouth daily.     . Cyanocobalamin (B-12) 1000 MCG CAPS Take 1,000 mcg by mouth 3 (three) times daily with meals.     . erlotinib ([place])  100 MG tablet Take 1 tablet (100 mg total) by mouth every evening. Take on an empty stomach 1 hour before meals or 2 hours after 30 tablet 2  . Hydrocodone-Acetaminophen (VICODIN) 5-300 MG TABS Take 1 tablet by mouth 4 (four) times daily as needed (for pain).     . hydroxychloroquine (PLAQUENIL) 200 MG tablet Take 400 mg by mouth every morning.     . loperamide (IMODIUM A-D) 2 MG tablet Take 1 mg by mouth 4 (four) times daily as needed for diarrhea or loose stools.     Marland Kitchen loratadine (CLARITIN) 10 MG tablet Take 10 mg by mouth daily as  needed for allergies.     Marland Kitchen LORazepam (ATIVAN) 0.5 MG tablet Take 1 tablet (0.5 mg total) by mouth every 8 (eight) hours as needed for anxiety. 30 tablet 0  . oxyCODONE (OXY IR/ROXICODONE) 5 MG immediate release tablet Take 1 tablet (5 mg total) by mouth every 4 (four) hours as needed for moderate pain. 30 tablet 0  . Polyethyl Glycol-Propyl Glycol (SYSTANE OP) Place 1 drop into both eyes at bedtime as needed (for dry eyes.).     Marland Kitchen psyllium (METAMUCIL) 58.6 % powder Take 1 packet by mouth daily as needed.    . Simethicone (GAS-X PO) Take 1 tablet by mouth 2 (two) times daily as needed (for gas).     Marland Kitchen tiotropium (SPIRIVA) 18 MCG inhalation capsule Place 18 mcg into inhaler and inhale every morning.      No current facility-administered medications for this visit.    SURGICAL HISTORY:  Past Surgical History  Procedure Laterality Date  . Anal fissure repair  07/09/2011    INTERNAL SPHINCTEROTOMY  . Knee arthroscopy Right 1999  . Thumb arthroscopy Left     - removed bone spur  . Thoracoscopy  01-26-2009    w/   lung/  node biopsy's  . Benign excision left breast central duct  02/1999  . Vault suspension plus cystocele repair with graft  06-13-2010  . Transthoracic echocardiogram  12-23-2008    NORMAL LV/  EF 65-70%/  MILD MR  &  TR  . Total abdominal hysterectomy w/ bilateral salpingoophorectomy  1982    W/  APPENDECTOMY  . Tonsillectomy  AS CHILD  . Excision right wrist mass  2012  . Mass excision Right 03/11/2014    Procedure: RIGHT WRIST DEEP MASS EXCISION WITH CULTURE AND BIOSPY;  Surgeon: Linna Hoff, MD;  Location: [facility];  Service: Orthopedics;  Laterality: Right;  . Chest tube insertion Right 07/14/2014    Procedure: INSERTION PLEURAL DRAINAGE CATHETER RIGHT CHEST;  Surgeon: Grace Isaac, MD;  Location: [place];  Service: Thoracic;  Laterality: Right;  . [place] pleurodesis Right 09/16/2014    Procedure: TALC PLEURADESIS/ slurry;  Surgeon: Grace Isaac,  MD;  Location: [facility];  Service: Thoracic;  Laterality: Right;  . Removal of pleural drainage catheter Right 10/14/2014    Procedure: REMOVAL OF PLEURAL DRAINAGE CATHETER;  Surgeon: Grace Isaac, MD;  Location: [facility];  Service: Thoracic;  Laterality: Right;    REVIEW OF SYSTEMS:  A comprehensive review of systems was negative except for: Constitutional: positive for fatigue Respiratory: positive for dyspnea on exertion Integument/breast: positive for rash   PHYSICAL EXAMINATION: General appearance: alert, cooperative and no distress Head: Normocephalic, without obvious abnormality, atraumatic Neck: no adenopathy, no JVD, supple, symmetrical, trachea midline and thyroid not enlarged, symmetric, no tenderness/mass/nodules Lymph nodes: Cervical, supraclavicular, and axillary nodes normal. Resp:  clear to auscultation bilaterally Cardio: regular rate and rhythm, S1, S2 normal, no murmur, click, rub or gallop GI: soft, non-tender; bowel sounds normal; no masses,  no organomegaly Extremities: extremities normal, atraumatic, no cyanosis or edema and Papular rash on the inner side of the thigh and leg secondary to recent shingles Neurologic: Alert and oriented X 3, normal strength and tone. Normal symmetric reflexes. Normal coordination and gait    ECOG PERFORMANCE STATUS: 1 - Symptomatic but completely ambulatory  Blood pressure 123/71, pulse 84, temperature 97.7 F (36.5 C), temperature source Oral, resp. rate 18, weight 114 lb 8 oz (51.937 kg), SpO2 97 %.  LABORATORY DATA: Lab Results  Component Value Date   WBC 4.9 06/06/2016   HGB 12.2 06/06/2016   HCT 35.7 06/06/2016   MCV 93.0 06/06/2016   PLT 221 06/06/2016      Chemistry      Component Value Date/Time   NA 143 04/02/2016 0926   NA 143 07/14/2014 0409   NA 145 11/09/2011 1345   K 3.7 04/02/2016 0926   K 4.1 07/14/2014 0409   K 3.9 11/09/2011 1345   CL 106 07/14/2014 0409   CL 108* 06/03/2013 0939   CL 104 11/09/2011  1345   CO2 27 04/02/2016 0926   CO2 25 07/14/2014 0409   CO2 28 11/09/2011 1345   BUN 17.7 04/02/2016 0926   BUN 12 07/14/2014 0409   BUN 20 11/09/2011 1345   CREATININE 0.9 04/02/2016 0926   CREATININE 0.83 07/14/2014 0409   CREATININE 0.6 11/09/2011 1345      Component Value Date/Time   CALCIUM 9.0 04/02/2016 0926   CALCIUM 8.4 07/14/2014 0409   CALCIUM 8.7 11/09/2011 1345   ALKPHOS 62 04/02/2016 0926   ALKPHOS 60 07/13/2014 2023   ALKPHOS 52 11/09/2011 1345   AST 39* 04/02/2016 0926   AST 24 07/13/2014 2023   AST 20 11/09/2011 1345   ALT 50 04/02/2016 0926   ALT 23 07/13/2014 2023   ALT 21 11/09/2011 1345   BILITOT 0.63 04/02/2016 0926   BILITOT 0.9 07/13/2014 2023   BILITOT 0.60 11/09/2011 1345       RADIOGRAPHIC STUDIES: Mm Digital Screening Bilateral  05/14/2016  CLINICAL DATA:  Screening. EXAM: DIGITAL SCREENING BILATERAL MAMMOGRAM WITH CAD COMPARISON:  Previous exam(s). ACR Breast Density Category c: The breast tissue is heterogeneously dense, which may obscure small masses. FINDINGS: There are no findings suspicious for malignancy. Images were processed with CAD. IMPRESSION: No mammographic evidence of malignancy. A result letter of this screening mammogram will be mailed directly to the patient. RECOMMENDATION: Screening mammogram in one year. (Code:SM-B-01Y) BI-RADS CATEGORY  1: Negative. Electronically Signed   By: Evangeline Dakin M.D.   On: 05/14/2016 16:29    ASSESSMENT AND PLAN: This is a very pleasant 81 years old white female with history of stage IIIA non-small cell lung cancer status post concurrent chemoradiation and has been on treatment with Tarceva for the last 82 months with no significant evidence for disease progression.  She is tolerating her treatment well except for mild fatigue, skin rash, itching and intermittent episodes of diarrhea. I recommended for her to continue her treatment with Tarceva at the same dose. She would come back for follow-up  visit in 2 months for reevaluation with repeat CBC and comprehensive metabolic panel. She was advised to call immediately if she has any concerning symptoms in the interval. The patient voices understanding of current disease status and treatment options and is in agreement  with the current care plan.  All questions were answered. The patient knows to call the clinic with any problems, questions or concerns. We can certainly see the patient much sooner if necessary.  Disclaimer: This note was dictated with voice recognition software. Similar sounding words can inadvertently be transcribed and may not be corrected upon review.       

## 2016-06-06 NOTE — Telephone Encounter (Signed)
Gave and printed appt sched and avs for pt for Aug °

## 2016-06-14 ENCOUNTER — Telehealth: Payer: Self-pay | Admitting: *Deleted

## 2016-06-14 NOTE — Telephone Encounter (Signed)
Pt called and left message asking for Dr. Worthy Flank opinion.  Stated pt had seen dermatologist Dr. Ronalee Red at [facility];  Pt had biopsy of rash on her arm done with Negative results.  Pt was prescribed  Methotrexate 2.5 mg -  Take 4 tablets  Every  Week, and  Folic Acid  Daily all other days.  Pt to have lab rechecked in 1 month after starting meds, and to follow up with Dr. [name] every 2 months with labs CBC, CMET.   Pt would like to know Dr. Worthy Flank opinion before starts taking these meds. Pt's   Phone    [phone].

## 2016-06-14 NOTE — Telephone Encounter (Signed)
Per [name] I told pt she take the methotrexate.

## 2016-06-27 ENCOUNTER — Encounter: Payer: Self-pay | Admitting: Internal Medicine

## 2016-06-27 NOTE — Progress Notes (Signed)
Per [employer] tarceva was shipped via [employer] 06/25/16

## 2016-07-04 ENCOUNTER — Telehealth: Payer: Self-pay | Admitting: Hematology and Oncology

## 2016-07-04 NOTE — Telephone Encounter (Signed)
I received a telephone call from a cardiologist. The patient has sensation of shortness of breath. Outside chest x-ray showed pulmonary infiltrate. The patient was also started on methotrexate recently for arthritis. I'm wondering whether Tarceva could be the cause of this pulmonary infiltrate. I recommend holding Tarceva pending further evaluation. The outside physician is ordering CT imaging of the chest I will convey disposition to her primary oncologist, Dr. [name] who is away and I'm covering him today.

## 2016-07-05 ENCOUNTER — Ambulatory Visit (INDEPENDENT_AMBULATORY_CARE_PROVIDER_SITE_OTHER): Payer: Medicare Other | Admitting: Internal Medicine

## 2016-07-05 ENCOUNTER — Other Ambulatory Visit (INDEPENDENT_AMBULATORY_CARE_PROVIDER_SITE_OTHER): Payer: Medicare Other

## 2016-07-05 ENCOUNTER — Encounter: Payer: Self-pay | Admitting: Internal Medicine

## 2016-07-05 ENCOUNTER — Telehealth: Payer: Self-pay | Admitting: Internal Medicine

## 2016-07-05 ENCOUNTER — Ambulatory Visit (INDEPENDENT_AMBULATORY_CARE_PROVIDER_SITE_OTHER)
Admission: RE | Admit: 2016-07-05 | Discharge: 2016-07-05 | Disposition: A | Discharge status: Home / Self Care | Payer: Medicare Other | Source: Ambulatory Visit | Attending: Internal Medicine | Admitting: Internal Medicine

## 2016-07-05 VITALS — BP 106/70 | HR 89 | Ht 62.0 in | Wt 115.0 lb

## 2016-07-05 DIAGNOSIS — R0600 Dyspnea, unspecified: Secondary | ICD-10-CM

## 2016-07-05 DIAGNOSIS — R0609 Other forms of dyspnea: Secondary | ICD-10-CM

## 2016-07-05 LAB — BASIC METABOLIC PANEL
BUN: 21 mg/dL (ref 6–23)
CHLORIDE: 98 meq/L (ref 96–112)
CO2: 32 meq/L (ref 19–32)
CREATININE: 0.88 mg/dL (ref 0.40–1.20)
Calcium: 9.8 mg/dL (ref 8.4–10.5)
GFR: 65.8 mL/min (ref >60.00)
GLUCOSE: 89 mg/dL (ref 70–99)
Potassium: 5.4 mEq/L — ABNORMAL HIGH (ref 3.5–5.1)
Sodium: 148 mEq/L — ABNORMAL HIGH (ref 135–145)

## 2016-07-05 LAB — CBC WITH DIFFERENTIAL/PLATELET
BASOS PCT: 0.3 % (ref 0.0–3.0)
Basophils Absolute: 0 10*3/uL (ref 0.0–0.1)
EOS PCT: 6.1 % — AB (ref 0.0–5.0)
Eosinophils Absolute: 0.3 10*3/uL (ref 0.0–0.7)
HEMATOCRIT: 37.2 % (ref 36.0–46.0)
HEMOGLOBIN: 12.4 g/dL (ref 12.0–15.0)
LYMPHS PCT: 10.7 % — AB (ref 12.0–46.0)
Lymphs Abs: 0.6 10*3/uL — ABNORMAL LOW (ref 0.7–4.0)
MCHC: 33.5 g/dL (ref 30.0–36.0)
MCV: 94.3 fl (ref 78.0–100.0)
MONO ABS: 0.5 10*3/uL (ref 0.1–1.0)
Monocytes Relative: 10.6 % (ref 3.0–12.0)
NEUTROS ABS: 3.7 10*3/uL (ref 1.4–7.7)
Neutrophils Relative %: 72.3 % (ref 43.0–77.0)
Platelets: 254 10*3/uL (ref 150.0–400.0)
RBC: 3.94 Mil/uL (ref 3.87–5.11)
RDW: 14.4 % (ref 11.5–15.5)
WBC: 5.2 10*3/uL (ref 4.0–10.5)

## 2016-07-05 LAB — BRAIN NATRIURETIC PEPTIDE: Pro B Natriuretic peptide (BNP): 98 pg/mL (ref 0.0–100.0)

## 2016-07-05 LAB — SEDIMENTATION RATE: Sed Rate: 3 mm/hr (ref 0–30)

## 2016-07-05 LAB — TSH: TSH: 3.68 u[IU]/mL (ref 0.35–4.50)

## 2016-07-05 MED ORDER — IOPAMIDOL (ISOVUE-370) INJECTION 76%
80.0000 mL | Freq: Once | INTRAVENOUS | Status: AC | PRN
  Administered 2016-07-05: 80 mL via INTRAVENOUS

## 2016-07-05 NOTE — Progress Notes (Signed)
Quick Note:  LMTCB ______ 

## 2016-07-05 NOTE — Progress Notes (Signed)
Subjective:    Patient ID: Dana [name], female    DOB: 10-Apr-1935,     MRN: [id number]  HPI  89 yowf quit smoking 1979 s/p III NSC never had any lung tissue removed referred to pulmonary clinic 07/05/2016 by Dr Theadore Nan for new sob.  Mohammed 06/06/16   DIAGNOSIS AND STAGE: : Stage IIIB (T2a, N3, M0) nonsmall cell lung cancer, adenocarcinoma,  with positive EGFR mutation diagnosed in January 2010.   PRIOR THERAPY:  1) Status post concurrent chemoradiation with weekly carboplatin and paclitaxel; last dose given March 21, 2009.  2) Tarceva at 150 mg p.o. daily, status post approximately 48 months of treatment, discontinued secondary to persistent diarrhea.  3) status post right Pleurx catheter placement for right nonmalignant pleural effusion.  CURRENT THERAPY: Tarceva at 100 mg p.o. Daily, started 03/19/2013, status post 34 months of treatment.   07/06/2016 1st [facility] office visit/ Sonni Barse   Chief Complaint  Patient presents with  . Pulmonary Consult    Referred by Dr. [name]. Pt c/o SOB and chest tightness since 2010- worse for the past 2 wks after starting on methotrexate.      Rash R forearm painful/ progressive > Dr Althia Forts derm > ID neg > surgery > rheumatology  Plaquenil > dermatology [facility]  Dx necrotizing granuomatous dermatitis ? Etiology started mtx x 2 weeks prior to OV  >  Last dose 06/27/16   On spiriva dpi daily Baseline doe = MMRC2 = can't walk a nl pace on a flat grade s sob but does fine slow and flat onset 06/27/16 of sob even sitting gradually worse didn't feel she could take a deep breath and symptoms peaked on 06/30/16 and have improved since  to point where did water aerobics one day prior to OV    No obvious other patterns in day to day or daytime variabilty or assoc excess/ purulent sputum or mucus plugs  Or hemoptysis or cp or   subjective wheeze overt sinus or hb symptoms. No unusual exp hx or h/o childhood pna/ asthma or knowledge of premature  birth.  Sleeping ok without nocturnal  or early am exacerbation  of respiratory  c/o's or need for noct saba. Also denies any obvious fluctuation of symptoms with weather or environmental changes or other aggravating or alleviating factors except as outlined above   Current Medications, Allergies, Complete Past Medical History, Past Surgical History, Family History, and Social History were reviewed in [employer] record.             Review of Systems  Constitutional: Negative for fever, chills and unexpected weight change.  HENT: Negative for congestion, dental problem, ear pain, nosebleeds, postnasal drip, rhinorrhea, sinus pressure, sneezing, sore throat, trouble swallowing and voice change.   Eyes: Negative for visual disturbance.  Respiratory: Positive for chest tightness and shortness of breath. Negative for cough and choking.   Cardiovascular: Negative for chest pain and leg swelling.  Gastrointestinal: Negative for vomiting, abdominal pain and diarrhea.  Genitourinary: Negative for difficulty urinating.  Musculoskeletal: Negative for arthralgias.  Skin: Negative for rash.  Neurological: Negative for tremors, syncope and headaches.  Hematological: Does not bruise/bleed easily.       Objective:   Physical Exam amb slt hoarse wf nad  Wt Readings from Last 3 Encounters:  07/05/16 115 lb (52.164 kg)  06/06/16 114 lb 8 oz (51.937 kg)  04/05/16 116 lb 1.6 oz (52.663 kg)    Vital signs reviewed  HEENT: nl dentition, turbinates, and oropharynx. Nl external ear canals without cough reflex   NECK :  without JVD/Nodes/TM/ nl carotid upstrokes bilaterally   LUNGS: no acc muscle use,  Nl contour chest with decreased bs R base  CV:  RRR  no s3 or murmur or increase in P2, no edema   ABD:  soft and nontender with nl inspiratory excursion in the supine position. No bruits or organomegaly, bowel sounds nl  MS:  Nl gait/ ext warm without deformities, calf  tenderness, cyanosis or clubbing No obvious joint restrictions   SKIN: warm and dry without lesions    NEURO:  alert, approp, nl sensorium with  no motor deficits     I personally reviewed images and agree with radiology impression as follows:  CXR:  07/03/16  ? ILD   I personally reviewed images and agree with radiology impression as follows:  CTa Chest   07/05/2016  1. No evidence of a pulmonary embolism. 2. No acute findings. 3. There is increased soft tissue posterior to the right hilum extending along the right subcarinal, azygoesophgeal recess region, concerning for recurrent tumor. 4. No other significant change.   Labs ordered/ reviewed:      Chemistry      Component Value Date/Time   NA 148* 07/05/2016 1012   NA 142 06/06/2016 0839   NA 145 11/09/2011 1345   K 5.4* 07/05/2016 1012   K 3.6 06/06/2016 0839   K 3.9 11/09/2011 1345   CL 98 07/05/2016 1012   CL 108* 06/03/2013 0939   CL 104 11/09/2011 1345   CO2 32 07/05/2016 1012   CO2 27 06/06/2016 0839   CO2 28 11/09/2011 1345   BUN 21 07/05/2016 1012   BUN 17.8 06/06/2016 0839   BUN 20 11/09/2011 1345   CREATININE 0.88 07/05/2016 1012   CREATININE 0.9 06/06/2016 0839   CREATININE 0.6 11/09/2011 1345      Component Value Date/Time   CALCIUM 9.8 07/05/2016 1012   CALCIUM 9.0 06/06/2016 0839   CALCIUM 8.7 11/09/2011 1345   ALKPHOS 47 06/06/2016 0839   ALKPHOS 60 07/13/2014 2023   ALKPHOS 52 11/09/2011 1345   AST 30 06/06/2016 0839   AST 24 07/13/2014 2023   AST 20 11/09/2011 1345   ALT 24 06/06/2016 0839   ALT 23 07/13/2014 2023   ALT 21 11/09/2011 1345   BILITOT 1.12 06/06/2016 0839   BILITOT 0.9 07/13/2014 2023   BILITOT 0.60 11/09/2011 1345        Lab Results  Component Value Date   WBC 5.2 07/05/2016   HGB 12.4 07/05/2016   HCT 37.2 07/05/2016   MCV 94.3 07/05/2016   PLT 254.0 07/05/2016        Lab Results  Component Value Date   TSH 3.68 07/05/2016     Lab Results  Component  Value Date   PROBNP 98.0 07/05/2016       Lab Results  Component Value Date   ESRSEDRATE 3 07/05/2016           Assessment & Plan:

## 2016-07-05 NOTE — Patient Instructions (Signed)
Please see patient coordinator before you leave today  to schedule CTa of chest before the weekend  Please remember to go to the lab  department downstairs for your tests - we will call you with the results when they are available.  When you get back to your normal self in terms of breathing try off the spiriva to see what difference it makes and if worse return here for the new version of spiriva spray (respimat)   Ok to resume tarceva but hold off on methotrexate for now

## 2016-07-05 NOTE — Telephone Encounter (Signed)
Spoke with pt and gave results. Nothing further needed.   Notes Recorded by Tanda Rockers, MD on 07/05/2016 at 1:41 PM Call patient : Studies are unremarkable, no change in recs

## 2016-07-06 NOTE — Assessment & Plan Note (Addendum)
07/05/2016  Walked RA x 3 laps @ 185 ft each stopped due to  End of study, nl pace no desat or sob  - Spirometry 07/05/2016  wnl p am spiriva  - CTa chest 07/05/2016 neg pe/ neg ILD, R infrahilar density c/w lung ca vs post RT scar   Symptoms are markedly disproportionate to objective findings and not clear this is a lung problem but pt does appear to have difficult airway management issues. DDX of  difficult airways management almost all start with A and  include Adherence, Ace Inhibitors, Acid Reflux, Active Sinus Disease, Alpha 1 Antitripsin deficiency, Anxiety masquerading as Airways dz,  ABPA,  Allergy(esp in young), Aspiration (esp in elderly), Adverse effects of meds,  Active smokers, A bunch of PE's (a small clot burden can't cause this syndrome unless there is already severe underlying pulm or vascular dz with poor reserve) plus two Bs  = Bronchiectasis and Beta blocker use..and one C= CHF  Adherence is always the initial "prime suspect" and is a multilayered concern that requires a "trust but verify" approach in every patient - starting with knowing how to use medications, especially inhalers, correctly, keeping up with refills and understanding the fundamental difference between maintenance and prns vs those medications only taken for a very short course and then stopped and not refilled.   Adverse drug effects > certainly sounds like she had a reaction to mtx which will need to be used with caution in combination with tarceva and plaquenil but defer that call to [facility]  ? A bunch of pe's > ruled out by CTa   ? Adverse effect of dpi > I don't use powders now in pts who can use alternatives because they sometimes mimic the condition they are treating. Not sure she needs laba in setting of no copd or airflow obst on pfts and ok to try off and if worse start back to spriva respimat  ? chf >  excluded by bnp < 100   Pulmonary f/u can be prn as there is nothing on ct or exam to suggest any dz other  than the one Dr [name] is actively treating/ following.  Total time devoted to counseling  = 35/27mreview case with pt/ discussion of options/alternatives/ personally creating written instructions  in presence of pt  then going over those specific  Instructions directly with the pt including how to use all of the meds but in particular covering each new medication in detail and the difference between the maintenance/automatic meds and the prns using an action plan format for the latter.

## 2016-07-09 NOTE — Progress Notes (Signed)
Quick Note:  Spoke with pt and notified of results per Dr. Wert. Pt verbalized understanding and denied any questions.  ______ 

## 2016-08-01 ENCOUNTER — Telehealth: Payer: Self-pay | Admitting: Internal Medicine

## 2016-08-01 ENCOUNTER — Encounter: Payer: Self-pay | Admitting: Internal Medicine

## 2016-08-01 ENCOUNTER — Other Ambulatory Visit (HOSPITAL_BASED_OUTPATIENT_CLINIC_OR_DEPARTMENT_OTHER): Payer: Medicare Other

## 2016-08-01 ENCOUNTER — Ambulatory Visit (HOSPITAL_BASED_OUTPATIENT_CLINIC_OR_DEPARTMENT_OTHER): Payer: Medicare Other | Admitting: Internal Medicine

## 2016-08-01 VITALS — BP 130/70 | HR 89 | Temp 97.8°F | Resp 17 | Ht 62.0 in | Wt 115.7 lb

## 2016-08-01 DIAGNOSIS — R5383 Other fatigue: Secondary | ICD-10-CM | POA: Diagnosis not present

## 2016-08-01 DIAGNOSIS — R197 Diarrhea, unspecified: Secondary | ICD-10-CM | POA: Diagnosis not present

## 2016-08-01 DIAGNOSIS — C3491 Malignant neoplasm of unspecified part of right bronchus or lung: Secondary | ICD-10-CM | POA: Diagnosis not present

## 2016-08-01 DIAGNOSIS — R21 Rash and other nonspecific skin eruption: Secondary | ICD-10-CM | POA: Diagnosis not present

## 2016-08-01 LAB — COMPREHENSIVE METABOLIC PANEL
ALBUMIN: 3.4 g/dL — AB (ref 3.5–5.0)
ALK PHOS: 55 U/L (ref 40–150)
ALT: 26 U/L (ref 0–55)
ANION GAP: 9 meq/L (ref 3–11)
AST: 28 U/L (ref 5–34)
BILIRUBIN TOTAL: 1.09 mg/dL (ref 0.20–1.20)
BUN: 16.6 mg/dL (ref 7.0–26.0)
CALCIUM: 9.5 mg/dL (ref 8.4–10.4)
CHLORIDE: 106 meq/L (ref 98–109)
CO2: 28 mEq/L (ref 22–29)
CREATININE: 0.8 mg/dL (ref 0.6–1.1)
EGFR: 66 mL/min/{1.73_m2} — ABNORMAL LOW (ref >90)
Glucose: 84 mg/dl (ref 70–140)
Potassium: 3.8 mEq/L (ref 3.5–5.1)
Sodium: 142 mEq/L (ref 136–145)
Total Protein: 6.1 g/dL — ABNORMAL LOW (ref 6.4–8.3)

## 2016-08-01 LAB — CBC WITH DIFFERENTIAL/PLATELET
BASO%: 0.2 % (ref 0.0–2.0)
BASOS ABS: 0 10*3/uL (ref 0.0–0.1)
EOS%: 5.1 % (ref 0.0–7.0)
Eosinophils Absolute: 0.3 10*3/uL (ref 0.0–0.5)
HEMATOCRIT: 39.6 % (ref 34.8–46.6)
HGB: 13 g/dL (ref 11.6–15.9)
LYMPH#: 0.4 10*3/uL — AB (ref 0.9–3.3)
LYMPH%: 8.1 % — ABNORMAL LOW (ref 14.0–49.7)
MCH: 31.5 pg (ref 25.1–34.0)
MCHC: 32.8 g/dL (ref 31.5–36.0)
MCV: 95.8 fL (ref 79.5–101.0)
MONO#: 0.6 10*3/uL (ref 0.1–0.9)
MONO%: 10.7 % (ref 0.0–14.0)
NEUT#: 4 10*3/uL (ref 1.5–6.5)
NEUT%: 75.9 % (ref 38.4–76.8)
PLATELETS: 243 10*3/uL (ref 145–400)
RBC: 4.13 10*6/uL (ref 3.70–5.45)
RDW: 13.9 % (ref 11.2–14.5)
WBC: 5.3 10*3/uL (ref 3.9–10.3)

## 2016-08-01 NOTE — Telephone Encounter (Signed)
per pof to sch pt appt-gave pt copy of avs °

## 2016-08-01 NOTE — Progress Notes (Signed)
Dana [name]   Fax:([phone]) [place], MD [place] [address]  DIAGNOSIS AND STAGE: : Stage IIIB (T2a, N3, M0) nonsmall cell lung cancer, adenocarcinoma, in a never smoker patient with positive EGFR mutation diagnosed in January 2010.   PRIOR THERAPY:  1) Status post concurrent chemoradiation with weekly carboplatin and paclitaxel; last dose given March 21, 2009.  2) Tarceva at 150 mg p.o. daily, status post approximately 48 months of treatment, discontinued secondary to persistent diarrhea.  3) status post right Pleurx catheter placement for right nonmalignant pleural effusion.  CURRENT THERAPY: Tarceva at 100 mg p.o. Daily, started 03/19/2013, status post 36 months of treatment.   CHEMOTHERAPY INTENT: Maintenance.  CURRENT # OF CHEMOTHERAPY CYCLES: 85 CURRENT ANTIEMETICS: Compazine  CURRENT SMOKING STATUS: Never smoker  ORAL CHEMOTHERAPY AND CONSENT: Tarceva  CURRENT BISPHOSPHONATES USE: None  PAIN MANAGEMENT: 5/10 low back pain and sciatica currently on naproxen.  NARCOTICS INDUCED CONSTIPATION: None  LIVING WILL AND CODE STATUS: Full code but no long-term resuscitation.  INTERVAL HISTORY: Dana [name] 81 y.o. female returns to the clinic today for two-month followup visit. She is tolerating her current treatment with Tarceva fairly well with no significant adverse effects except for dry skin and itching. She also has occasional episodes of diarrhea. She has been complaining of increasing fatigue recently. She also has shortness breath with exertion. She has no chest pain, cough or hemoptysis. No significant weight loss or night sweats. She has no fever or chills. She had repeat CT angiogram of the chest last month that showed no evidence for disease progression. The patient has bloodwork performed earlier today and she is here for evaluation.  MEDICAL HISTORY: Past Medical  History:  Diagnosis Date  . Arthritis    HANDS,  WRISTS  . COPD (chronic obstructive pulmonary disease) ([place])   . Dyspnea on exertion   . GERD (gastroesophageal reflux disease)   . Hemorrhoid   . History of anal fissures   . History of lung cancer ONCOLOGIST--  DR [facility]--  LAST CT ,  NO RECURRENCE OR METS   DX JAN 2010 --  STAGE IIIA  NON-SMALL CELL ADENOCARCINOMA (RIGHT MIDDLE LOBE)---  S/P  CHEMORADIATIO THERAPY  (COMPLETE 03-21-2009)  . lung ca dx'd 2010  . Normal cardiac stress test    2007  PER PT  . Osteoporosis   . Rash, skin    RIGHT FOREARM/ HAND  . Right wrist pain    MASS  . Wears glasses     ALLERGIES:  is allergic to amoxicillin.  MEDICATIONS:  Current Outpatient Prescriptions  Medication Sig Dispense Refill  . aspirin EC 81 MG tablet Take 81 mg by mouth daily.    Marland Kitchen bismuth subsalicylate (PEPTO BISMOL) 262 MG/15ML suspension Take 30 mLs by mouth every three (3) days as needed.    . Calcium Carbonate-Vitamin D 600-400 MG-UNIT per chew tablet Chew 1 tablet by mouth 2 (two) times daily.    . cholecalciferol (VITAMIN D) 1000 UNITS tablet Take 1,000 Units by mouth daily.     . Coenzyme Q10 (COQ10) 100 MG CAPS Take 1 capsule by mouth daily.     . Cyanocobalamin (B-12) 1000 MCG CAPS Take 1,000 mcg by mouth 3 (three) times daily with meals.     . erlotinib (TARCEVA) 100 MG tablet Take 1 tablet (100 mg total) by mouth every evening. Take on an empty stomach 1 hour before  meals or 2 hours after (Patient not taking: Reported on 07/05/2016) 30 tablet 2  . Hydrocodone-Acetaminophen (VICODIN) 5-300 MG TABS Take 1 tablet by mouth 4 (four) times daily as needed (for pain).     . hydroxychloroquine (PLAQUENIL) 200 MG tablet Take 400 mg by mouth every morning.     . loperamide (IMODIUM A-D) 2 MG tablet Take 1 mg by mouth 4 (four) times daily as needed for diarrhea or loose stools.     Marland Kitchen loratadine (CLARITIN) 10 MG tablet Take 10 mg by mouth daily as needed for allergies.     Marland Kitchen  LORazepam (ATIVAN) 0.5 MG tablet Take 1 tablet (0.5 mg total) by mouth every 8 (eight) hours as needed for anxiety. 30 tablet 0  . oxyCODONE (OXY IR/ROXICODONE) 5 MG immediate release tablet Take 1 tablet (5 mg total) by mouth every 4 (four) hours as needed for moderate pain. 30 tablet 0  . Polyethyl Glycol-Propyl Glycol (SYSTANE OP) Place 1 drop into both eyes at bedtime as needed (for dry eyes.).     Marland Kitchen psyllium (METAMUCIL) 58.6 % powder Take 1 packet by mouth daily as needed.    . Simethicone (GAS-X PO) Take 1 tablet by mouth 2 (two) times daily as needed (for gas).     Marland Kitchen tiotropium (SPIRIVA) 18 MCG inhalation capsule Place 18 mcg into inhaler and inhale every morning.      No current facility-administered medications for this visit.     SURGICAL HISTORY:  Past Surgical History:  Procedure Laterality Date  . ANAL FISSURE REPAIR  07/09/2011   INTERNAL SPHINCTEROTOMY  . BENIGN EXCISION LEFT BREAST CENTRAL DUCT  02/1999  . CHEST TUBE INSERTION Right 07/14/2014   Procedure: INSERTION PLEURAL DRAINAGE CATHETER RIGHT CHEST;  Surgeon: Grace Isaac, MD;  Location: [place];  Service: Thoracic;  Laterality: Right;  . EXCISION RIGHT WRIST MASS  2012  . KNEE ARTHROSCOPY Right 1999  . MASS EXCISION Right 03/11/2014   Procedure: RIGHT WRIST DEEP MASS EXCISION WITH CULTURE AND BIOSPY;  Surgeon: Linna Hoff, MD;  Location: [place];  Service: Orthopedics;  Laterality: Right;  . REMOVAL OF PLEURAL DRAINAGE CATHETER Right 10/14/2014   Procedure: REMOVAL OF PLEURAL DRAINAGE CATHETER;  Surgeon: Grace Isaac, MD;  Location: [place];  Service: Thoracic;  Laterality: Right;  . TALC PLEURODESIS Right 09/16/2014   Procedure: TALC PLEURADESIS/ slurry;  Surgeon: Grace Isaac, MD;  Location: [place];  Service: Thoracic;  Laterality: Right;  . THORACOSCOPY  01-26-2009   w/   lung/  node biopsy's  . THUMB ARTHROSCOPY Left    - removed bone spur  . TONSILLECTOMY  AS CHILD  . TOTAL  ABDOMINAL HYSTERECTOMY W/ BILATERAL SALPINGOOPHORECTOMY  1982   W/  APPENDECTOMY  . TRANSTHORACIC ECHOCARDIOGRAM  12-23-2008   NORMAL LV/  EF 65-70%/  MILD MR  &  TR  . VAULT SUSPENSION PLUS CYSTOCELE REPAIR WITH GRAFT  06-13-2010    REVIEW OF SYSTEMS:  A comprehensive review of systems was negative except for: Constitutional: positive for fatigue Respiratory: positive for dyspnea on exertion Gastrointestinal: positive for diarrhea   PHYSICAL EXAMINATION: General appearance: alert, cooperative and no distress Head: Normocephalic, without obvious abnormality, atraumatic Neck: no adenopathy, no JVD, supple, symmetrical, trachea midline and thyroid not enlarged, symmetric, no tenderness/mass/nodules Lymph nodes: Cervical, supraclavicular, and axillary nodes normal. Resp: clear to auscultation bilaterally Cardio: regular rate and rhythm, S1, S2 normal, no murmur, click, rub or gallop GI: soft, non-tender; bowel sounds  normal; no masses,  no organomegaly Extremities: extremities normal, atraumatic, no cyanosis or edema and Papular rash on the inner side of the thigh and leg secondary to recent shingles Neurologic: Alert and oriented X 3, normal strength and tone. Normal symmetric reflexes. Normal coordination and gait    ECOG PERFORMANCE STATUS: 1 - Symptomatic but completely ambulatory  Blood pressure 130/70, pulse 89, temperature 97.8 F (36.6 C), temperature source Oral, resp. rate 17, height _0  (1.575 m), weight 115 lb 11.2 oz (52.5 kg), SpO2 99 %.  LABORATORY DATA: Lab Results  Component Value Date   WBC 5.3 08/01/2016   HGB 13.0 08/01/2016   HCT 39.6 08/01/2016   MCV 95.8 08/01/2016   PLT 243 08/01/2016      Chemistry      Component Value Date/Time   NA 148 (H) 07/05/2016 1012   NA 142 06/06/2016 0839   K 5.4 (H) 07/05/2016 1012   K 3.6 06/06/2016 0839   CL 98 07/05/2016 1012   CL 108 (H) 06/03/2013 0939   CO2 32 07/05/2016 1012   CO2 27 06/06/2016 0839   BUN 21  07/05/2016 1012   BUN 17.8 06/06/2016 0839   CREATININE 0.88 07/05/2016 1012   CREATININE 0.9 06/06/2016 0839      Component Value Date/Time   CALCIUM 9.8 07/05/2016 1012   CALCIUM 9.0 06/06/2016 0839   ALKPHOS 47 06/06/2016 0839   AST 30 06/06/2016 0839   ALT 24 06/06/2016 0839   BILITOT 1.12 06/06/2016 0839       RADIOGRAPHIC STUDIES: Ct Angio Chest Pe W Or Wo Contrast  Result Date: 07/05/2016 CLINICAL DATA:  Pt with new dyspnea x 2 weeks since she started a new medHx of lung CA with chemo and radiationNo hx of sx EXAM: CT ANGIOGRAPHY CHEST WITH CONTRAST TECHNIQUE: Multidetector CT imaging of the chest was performed using the standard protocol during bolus administration of intravenous contrast. Multiplanar CT image reconstructions and MIPs were obtained to evaluate the vascular anatomy. CONTRAST:  80 mL of Isovue 370 intravenous contrast COMPARISON:  Chest radiograph, 07/03/2016.  Chest CT, 04/02/2016. FINDINGS: Angiographic study: No evidence of a pulmonary embolus. Great vessels are normal in caliber. No aortic dissection. Minor plaque noted along the aortic arch and at the origin of the innominate artery. Common origin for the left common carotid artery and innominate artery. Neck base and axilla:  No mass or adenopathy. Mediastinum and hila: Poorly defined soft tissue attenuation lies posterior to the carina extending along the azygoesophgeal recess posterior to the left atrium. This has increased in size when compared the prior study. At the upper level of the left atrium, this was concave measuring only approximately 9 mm is thickness. It currently measures 2.8 cm in thickness. Superior to this, at the level of the upper carina, there is soft tissue thickening contiguous with the right margin of the esophagus currently measuring 14 mm, previously approximately 10 mm. There are no discrete enlarged mediastinal or hilar lymph nodes. Other areas of soft tissue attenuation along the right  hilum reflecting post radiation change are stable. Lungs and pleura: Post radiation scarring along the medial right lung extending to the base of the right lower lobe is without significant change from the prior exam. Small mixed attenuation nodule in the subpleural aspect of the left upper lobe, centered on image 31, series 6, measures slightly smaller, 7.4 mm compared to 8.5 mm. Subtle area of focal ground-glass opacity at the left apex is stable. No new  lung nodules or areas of opacification. Pleural thickening at the right lung base is stable. Limited upper abdomen:  No acute finding. Musculoskeletal: Area sclerosis along the mid sternum may reflect an old fracture. This is stable. No osteolytic lesions. Bones are demineralized. Review of the MIP images confirms the above findings. IMPRESSION: 1. No evidence of a pulmonary embolism. 2. No acute findings. 3. There is increased soft tissue posterior to the right hilum extending along the right subcarinal, azygoesophgeal recess region, concerning for recurrent tumor. 4. No other significant change. Electronically Signed   By: Lajean Manes M.D.   On: 07/05/2016 15:32    ASSESSMENT AND PLAN: This is a very pleasant 81 years old white female with history of stage IIIA non-small cell lung cancer status post concurrent chemoradiation and has been on treatment with Tarceva for the last 84 months with no significant evidence for disease progression.  She is tolerating her treatment well except for mild fatigue, skin rash, itching and intermittent episodes of diarrhea. The most recent CT angiogram of the chest showed no evidence for disease progression. I discussed the scan results with the patient today. I recommended for her to continue her treatment with Tarceva at the same dose. She would come back for follow-up visit in 2 months for reevaluation with repeat CBC and comprehensive metabolic panel. She was advised to call immediately if she has any concerning  symptoms in the interval. The patient voices understanding of current disease status and treatment options and is in agreement with the current care plan.  All questions were answered. The patient knows to call the clinic with any problems, questions or concerns. We can certainly see the patient much sooner if necessary.  Disclaimer: This note was dictated with voice recognition software. Similar sounding words can inadvertently be transcribed and may not be corrected upon review.

## 2016-08-28 ENCOUNTER — Other Ambulatory Visit: Payer: Self-pay | Admitting: Medical Oncology

## 2016-08-28 DIAGNOSIS — C3491 Malignant neoplasm of unspecified part of right bronchus or lung: Secondary | ICD-10-CM

## 2016-08-28 MED ORDER — ERLOTINIB HCL 100 MG PO TABS: 100.0000 mg | ORAL_TABLET | Freq: Every evening | ORAL | 1 refills | Status: DC

## 2016-08-28 MED ORDER — ERLOTINIB HCL 100 MG PO TABS: 100.0000 mg | ORAL_TABLET | Freq: Every evening | ORAL | 0 refills | Status: DC

## 2016-10-01 ENCOUNTER — Ambulatory Visit (HOSPITAL_BASED_OUTPATIENT_CLINIC_OR_DEPARTMENT_OTHER): Payer: Medicare Other | Admitting: Internal Medicine

## 2016-10-01 ENCOUNTER — Other Ambulatory Visit (HOSPITAL_BASED_OUTPATIENT_CLINIC_OR_DEPARTMENT_OTHER): Payer: Medicare Other

## 2016-10-01 ENCOUNTER — Encounter: Payer: Self-pay | Admitting: Internal Medicine

## 2016-10-01 ENCOUNTER — Telehealth: Payer: Self-pay | Admitting: Internal Medicine

## 2016-10-01 VITALS — BP 134/79 | HR 87 | Temp 98.2°F | Resp 17 | Ht 62.0 in | Wt 114.7 lb

## 2016-10-01 DIAGNOSIS — C3491 Malignant neoplasm of unspecified part of right bronchus or lung: Secondary | ICD-10-CM

## 2016-10-01 DIAGNOSIS — R0609 Other forms of dyspnea: Secondary | ICD-10-CM | POA: Diagnosis not present

## 2016-10-01 DIAGNOSIS — R21 Rash and other nonspecific skin eruption: Secondary | ICD-10-CM

## 2016-10-01 DIAGNOSIS — R5383 Other fatigue: Secondary | ICD-10-CM

## 2016-10-01 DIAGNOSIS — R197 Diarrhea, unspecified: Secondary | ICD-10-CM

## 2016-10-01 DIAGNOSIS — Z5181 Encounter for therapeutic drug level monitoring: Secondary | ICD-10-CM

## 2016-10-01 HISTORY — DX: Encounter for therapeutic drug level monitoring: Z51.81

## 2016-10-01 LAB — COMPREHENSIVE METABOLIC PANEL
ALBUMIN: 3.2 g/dL — AB (ref 3.5–5.0)
ALK PHOS: 51 U/L (ref 40–150)
ALT: 20 U/L (ref 0–55)
AST: 24 U/L (ref 5–34)
Anion Gap: 9 mEq/L (ref 3–11)
BILIRUBIN TOTAL: 0.94 mg/dL (ref 0.20–1.20)
BUN: 22.3 mg/dL (ref 7.0–26.0)
CO2: 27 mEq/L (ref 22–29)
Calcium: 9.1 mg/dL (ref 8.4–10.4)
Chloride: 107 mEq/L (ref 98–109)
Creatinine: 0.8 mg/dL (ref 0.6–1.1)
EGFR: 68 mL/min/{1.73_m2} — AB (ref >90)
Glucose: 90 mg/dl (ref 70–140)
POTASSIUM: 3.8 meq/L (ref 3.5–5.1)
SODIUM: 143 meq/L (ref 136–145)
TOTAL PROTEIN: 5.6 g/dL — AB (ref 6.4–8.3)

## 2016-10-01 LAB — CBC WITH DIFFERENTIAL/PLATELET
BASO%: 0.2 % (ref 0.0–2.0)
BASOS ABS: 0 10*3/uL (ref 0.0–0.1)
EOS ABS: 0.4 10*3/uL (ref 0.0–0.5)
EOS%: 7.3 % — ABNORMAL HIGH (ref 0.0–7.0)
HCT: 35.4 % (ref 34.8–46.6)
HEMOGLOBIN: 11.9 g/dL (ref 11.6–15.9)
LYMPH%: 11.2 % — ABNORMAL LOW (ref 14.0–49.7)
MCH: 31.6 pg (ref 25.1–34.0)
MCHC: 33.6 g/dL (ref 31.5–36.0)
MCV: 93.9 fL (ref 79.5–101.0)
MONO#: 0.4 10*3/uL (ref 0.1–0.9)
MONO%: 8.1 % (ref 0.0–14.0)
NEUT%: 73.2 % (ref 38.4–76.8)
NEUTROS ABS: 3.8 10*3/uL (ref 1.5–6.5)
PLATELETS: 243 10*3/uL (ref 145–400)
RBC: 3.77 10*6/uL (ref 3.70–5.45)
RDW: 13.3 % (ref 11.2–14.5)
WBC: 5.2 10*3/uL (ref 3.9–10.3)
lymph#: 0.6 10*3/uL — ABNORMAL LOW (ref 0.9–3.3)

## 2016-10-01 MED ORDER — CLINDAMYCIN PHOSPHATE 1 % EX SOLN: Freq: Two times a day (BID) | CUTANEOUS | 0 refills | Status: DC

## 2016-10-01 NOTE — Telephone Encounter (Signed)
Avs report and appointment schedule given to patient, per 10/01/16 los °

## 2016-10-01 NOTE — Progress Notes (Signed)
Dana [name] Telephone:([phone]   Fax:(336) [place], [place] [address]  DIAGNOSIS AND STAGE: : Stage IIIB (T2a, N3, M0) nonsmall cell lung cancer, adenocarcinoma, in a never smoker patient with positive EGFR mutation diagnosed in January 2010.   PRIOR THERAPY:  1) Status post concurrent chemoradiation with weekly carboplatin and paclitaxel; last dose given March 21, 2009.  2) Tarceva at 150 mg p.o. daily, status post approximately 48 months of treatment, discontinued secondary to persistent diarrhea.  3) status post right Pleurx catheter placement for right nonmalignant pleural effusion.  CURRENT THERAPY: Tarceva at 100 mg p.o. Daily, started 03/19/2013, status post 38 months of treatment.   CHEMOTHERAPY INTENT: Maintenance.  CURRENT # OF CHEMOTHERAPY CYCLES: 87 CURRENT ANTIEMETICS: Compazine  CURRENT SMOKING STATUS: Never smoker  ORAL CHEMOTHERAPY AND CONSENT: Tarceva  CURRENT BISPHOSPHONATES USE: None  PAIN MANAGEMENT: 5/10 low back pain and sciatica currently on naproxen.  NARCOTICS INDUCED CONSTIPATION: None  LIVING WILL AND CODE STATUS: Full code but no long-term resuscitation.  INTERVAL HISTORY: Dana [name] 81 y.o. female returns to the clinic today for two-month followup visit. She is tolerating her current treatment with Tarceva fairly well with no significant adverse effects except for dry skin and flare of skin rash on the face started 2 days ago. She also has occasional episodes of diarrhea. She also has shortness breath with exertion. She has no chest pain, cough or hemoptysis. No significant weight loss or night sweats. She has no fever or chills. She is here today for evaluation with repeat blood work.  MEDICAL HISTORY: Past Medical History:  Diagnosis Date  . Arthritis    HANDS,  WRISTS  . COPD (chronic obstructive pulmonary disease) ([place])   . Dyspnea on exertion   .  GERD (gastroesophageal reflux disease)   . Hemorrhoid   . History of anal fissures   . History of lung cancer ONCOLOGIST--  DR [facility]--  LAST CT ,  NO RECURRENCE OR METS   DX JAN 2010 --  STAGE IIIA  NON-SMALL CELL ADENOCARCINOMA (RIGHT MIDDLE LOBE)---  S/P  CHEMORADIATIO THERAPY  (COMPLETE 03-21-2009)  . lung ca dx'd 2010  . Normal cardiac stress test    2007  PER PT  . Osteoporosis   . Rash, skin    RIGHT FOREARM/ HAND  . Right wrist pain    MASS  . Wears glasses     ALLERGIES:  is allergic to amoxicillin.  MEDICATIONS:  Current Outpatient Prescriptions  Medication Sig Dispense Refill  . aspirin EC 81 MG tablet Take 81 mg by mouth daily.    Marland Kitchen bismuth subsalicylate (PEPTO BISMOL) 262 MG/15ML suspension Take 30 mLs by mouth every three (3) days as needed.    . Calcium Carbonate-Vitamin D 600-400 MG-UNIT per chew tablet Chew 1 tablet by mouth 2 (two) times daily.    . cholecalciferol (VITAMIN D) 1000 UNITS tablet Take 1,000 Units by mouth daily.     . Coenzyme Q10 (COQ10) 100 MG CAPS Take 1 capsule by mouth daily.     . Cyanocobalamin (B-12) 1000 MCG CAPS Take 1,000 mcg by mouth 3 (three) times daily with meals.     . erlotinib (TARCEVA) 100 MG tablet Take 1 tablet (100 mg total) by mouth every evening. Take on an empty stomach 1 hour before meals or 2 hours after 30 tablet 1  . Hydrocodone-Acetaminophen (VICODIN) 5-300 MG TABS Take 1 tablet  by mouth 4 (four) times daily as needed (for pain).     . hydroxychloroquine (PLAQUENIL) 200 MG tablet Take 400 mg by mouth every morning.     . loperamide (IMODIUM A-D) 2 MG tablet Take 1 mg by mouth 4 (four) times daily as needed for diarrhea or loose stools.     Marland Kitchen loratadine (CLARITIN) 10 MG tablet Take 10 mg by mouth daily as needed for allergies.     Marland Kitchen LORazepam (ATIVAN) 0.5 MG tablet Take 1 tablet (0.5 mg total) by mouth every 8 (eight) hours as needed for anxiety. 30 tablet 0  . oxyCODONE (OXY IR/ROXICODONE) 5 MG immediate release tablet  Take 1 tablet (5 mg total) by mouth every 4 (four) hours as needed for moderate pain. 30 tablet 0  . Polyethyl Glycol-Propyl Glycol (SYSTANE OP) Place 1 drop into both eyes at bedtime as needed (for dry eyes.).     Marland Kitchen psyllium (METAMUCIL) 58.6 % powder Take 1 packet by mouth daily as needed.    . Simethicone (GAS-X PO) Take 1 tablet by mouth 2 (two) times daily as needed (for gas).     Marland Kitchen tiotropium (SPIRIVA) 18 MCG inhalation capsule Place 18 mcg into inhaler and inhale every morning.      No current facility-administered medications for this visit.     SURGICAL HISTORY:  Past Surgical History:  Procedure Laterality Date  . ANAL FISSURE REPAIR  07/09/2011   INTERNAL SPHINCTEROTOMY  . BENIGN EXCISION LEFT BREAST CENTRAL DUCT  02/1999  . CHEST TUBE INSERTION Right 07/14/2014   Procedure: INSERTION PLEURAL DRAINAGE CATHETER RIGHT CHEST;  Surgeon: Grace Isaac, MD;  Location: [place];  Service: Thoracic;  Laterality: Right;  . EXCISION RIGHT WRIST MASS  2012  . KNEE ARTHROSCOPY Right 1999  . MASS EXCISION Right 03/11/2014   Procedure: RIGHT WRIST DEEP MASS EXCISION WITH CULTURE AND BIOSPY;  Surgeon: Linna Hoff, MD;  Location: [place];  Service: Orthopedics;  Laterality: Right;  . REMOVAL OF PLEURAL DRAINAGE CATHETER Right 10/14/2014   Procedure: REMOVAL OF PLEURAL DRAINAGE CATHETER;  Surgeon: Grace Isaac, MD;  Location: [facility];  Service: Thoracic;  Laterality: Right;  . TALC PLEURODESIS Right 09/16/2014   Procedure: TALC PLEURADESIS/ slurry;  Surgeon: Grace Isaac, MD;  Location: [facility];  Service: Thoracic;  Laterality: Right;  . THORACOSCOPY  01-26-2009   w/   lung/  node biopsy's  . THUMB ARTHROSCOPY Left    - removed bone spur  . TONSILLECTOMY  AS CHILD  . TOTAL ABDOMINAL HYSTERECTOMY W/ BILATERAL SALPINGOOPHORECTOMY  1982   W/  APPENDECTOMY  . TRANSTHORACIC ECHOCARDIOGRAM  12-23-2008   NORMAL LV/  EF 65-70%/  MILD MR  &  TR  . VAULT SUSPENSION PLUS  CYSTOCELE REPAIR WITH GRAFT  06-13-2010    REVIEW OF SYSTEMS:  A comprehensive review of systems was negative except for: Constitutional: positive for fatigue Respiratory: positive for dyspnea on exertion Gastrointestinal: positive for diarrhea Integument/breast: positive for rash   PHYSICAL EXAMINATION: General appearance: alert, cooperative and no distress Head: Normocephalic, without obvious abnormality, atraumatic Neck: no adenopathy, no JVD, supple, symmetrical, trachea midline and thyroid not enlarged, symmetric, no tenderness/mass/nodules Lymph nodes: Cervical, supraclavicular, and axillary nodes normal. Resp: clear to auscultation bilaterally Cardio: regular rate and rhythm, S1, S2 normal, no murmur, click, rub or gallop GI: soft, non-tender; bowel sounds normal; no masses,  no organomegaly Extremities: extremities normal, atraumatic, no cyanosis or edema and Papular rash on the inner  side of the thigh and leg secondary to recent shingles Neurologic: Alert and oriented X 3, normal strength and tone. Normal symmetric reflexes. Normal coordination and gait    ECOG PERFORMANCE STATUS: 1 - Symptomatic but completely ambulatory  Blood pressure 134/79, pulse 87, temperature 98.2 F (36.8 C), temperature source Oral, resp. rate 17, height _0  (1.575 m), weight 114 lb 11.2 oz (52 kg), SpO2 96 %.  LABORATORY DATA: Lab Results  Component Value Date   WBC 5.2 10/01/2016   HGB 11.9 10/01/2016   HCT 35.4 10/01/2016   MCV 93.9 10/01/2016   PLT 243 10/01/2016      Chemistry      Component Value Date/Time   NA 142 08/01/2016 0831   K 3.8 08/01/2016 0831   CL 98 07/05/2016 1012   CL 108 (H) 06/03/2013 0939   CO2 28 08/01/2016 0831   BUN 16.6 08/01/2016 0831   CREATININE 0.8 08/01/2016 0831      Component Value Date/Time   CALCIUM 9.5 08/01/2016 0831   ALKPHOS 55 08/01/2016 0831   AST 28 08/01/2016 0831   ALT 26 08/01/2016 0831   BILITOT 1.09 08/01/2016 0831        RADIOGRAPHIC STUDIES: No results found.  ASSESSMENT AND PLAN: This is a very pleasant 81 years old white female with history of stage IIIA non-small cell lung cancer status post concurrent chemoradiation and has been on treatment with Tarceva for the last 86 months with no significant evidence for disease progression.  She is tolerating her treatment well except for mild fatigue, skin rash, itching and intermittent episodes of diarrhea. For the skin rash, I will give the patient refill of clindamycin lotion. I recommended for her to continue her treatment with Tarceva at the same dose. She would come back for follow-up visit in 2 months for reevaluation with repeat CBC and comprehensive metabolic panel as well as CT scan of the chest for restaging of her disease. She was advised to call immediately if she has any concerning symptoms in the interval. The patient voices understanding of current disease status and treatment options and is in agreement with the current care plan.  All questions were answered. The patient knows to call the clinic with any problems, questions or concerns. We can certainly see the patient much sooner if necessary.  Disclaimer: This note was dictated with voice recognition software. Similar sounding words can inadvertently be transcribed and may not be corrected upon review.

## 2016-10-22 ENCOUNTER — Other Ambulatory Visit: Payer: Self-pay | Admitting: Medical Oncology

## 2016-10-22 DIAGNOSIS — C3491 Malignant neoplasm of unspecified part of right bronchus or lung: Secondary | ICD-10-CM

## 2016-10-22 MED ORDER — ERLOTINIB HCL 100 MG PO TABS: 100.0000 mg | ORAL_TABLET | Freq: Every evening | ORAL | 2 refills | Status: DC

## 2016-10-29 ENCOUNTER — Telehealth: Payer: Self-pay | Admitting: Pharmacist

## 2016-10-29 NOTE — Telephone Encounter (Signed)
Oral Chemotherapy Pharmacist Encounter  Received forwarded message this morning from patient requested help identifying copay assistance for Tarceva as patient is about to exhaust funds from [employer]. I was able to identify that [employer] has funds available for non-small cell lung cancer.  I called patient and talked her through process of applying for assistance with [employer]. Patient will call office with any questions or concerns.  Johny Drilling, PharmD, BCPS 10/29/2016  9:46 AM Oral Chemotherapy Clinic [phone]

## 2016-11-07 ENCOUNTER — Encounter: Payer: Self-pay | Admitting: Internal Medicine

## 2016-11-07 NOTE — Progress Notes (Signed)
Rcvd approval letter from [facility].  Pt is approved for Tarceva from 09/29/16 - 09/28/17 for $6,000.

## 2016-11-23 ENCOUNTER — Other Ambulatory Visit (HOSPITAL_BASED_OUTPATIENT_CLINIC_OR_DEPARTMENT_OTHER): Payer: Medicare Other

## 2016-11-23 ENCOUNTER — Ambulatory Visit (HOSPITAL_COMMUNITY)
Admission: RE | Admit: 2016-11-23 | Discharge: 2016-11-23 | Disposition: A | Discharge status: Home / Self Care | Payer: Medicare Other | Source: Ambulatory Visit | Attending: Internal Medicine | Admitting: Internal Medicine

## 2016-11-23 DIAGNOSIS — Z5181 Encounter for therapeutic drug level monitoring: Secondary | ICD-10-CM

## 2016-11-23 DIAGNOSIS — C3491 Malignant neoplasm of unspecified part of right bronchus or lung: Secondary | ICD-10-CM | POA: Diagnosis present

## 2016-11-23 DIAGNOSIS — I2510 Atherosclerotic heart disease of native coronary artery without angina pectoris: Secondary | ICD-10-CM | POA: Diagnosis not present

## 2016-11-23 DIAGNOSIS — I700 Atherosclerosis of aorta: Secondary | ICD-10-CM | POA: Insufficient documentation

## 2016-11-23 DIAGNOSIS — Z923 Personal history of irradiation: Secondary | ICD-10-CM | POA: Insufficient documentation

## 2016-11-23 DIAGNOSIS — R911 Solitary pulmonary nodule: Secondary | ICD-10-CM | POA: Diagnosis not present

## 2016-11-23 LAB — CBC WITH DIFFERENTIAL/PLATELET
BASO%: 0.3 % (ref 0.0–2.0)
BASOS ABS: 0 10*3/uL (ref 0.0–0.1)
EOS%: 5.4 % (ref 0.0–7.0)
Eosinophils Absolute: 0.3 10*3/uL (ref 0.0–0.5)
HEMATOCRIT: 39 % (ref 34.8–46.6)
HGB: 12.9 g/dL (ref 11.6–15.9)
LYMPH#: 0.5 10*3/uL — AB (ref 0.9–3.3)
LYMPH%: 9.6 % — AB (ref 14.0–49.7)
MCH: 31.2 pg (ref 25.1–34.0)
MCHC: 32.9 g/dL (ref 31.5–36.0)
MCV: 94.6 fL (ref 79.5–101.0)
MONO#: 0.5 10*3/uL (ref 0.1–0.9)
MONO%: 8.7 % (ref 0.0–14.0)
NEUT#: 4.3 10*3/uL (ref 1.5–6.5)
NEUT%: 76 % (ref 38.4–76.8)
PLATELETS: 276 10*3/uL (ref 145–400)
RBC: 4.13 10*6/uL (ref 3.70–5.45)
RDW: 13.8 % (ref 11.2–14.5)
WBC: 5.7 10*3/uL (ref 3.9–10.3)

## 2016-11-23 LAB — COMPREHENSIVE METABOLIC PANEL
ALT: 29 U/L (ref 0–55)
ANION GAP: 11 meq/L (ref 3–11)
AST: 33 U/L (ref 5–34)
Albumin: 3.4 g/dL — ABNORMAL LOW (ref 3.5–5.0)
Alkaline Phosphatase: 66 U/L (ref 40–150)
BUN: 22.3 mg/dL (ref 7.0–26.0)
CALCIUM: 9.4 mg/dL (ref 8.4–10.4)
CHLORIDE: 107 meq/L (ref 98–109)
CO2: 24 mEq/L (ref 22–29)
Creatinine: 0.9 mg/dL (ref 0.6–1.1)
EGFR: 65 mL/min/{1.73_m2} — AB (ref >90)
Glucose: 89 mg/dl (ref 70–140)
POTASSIUM: 4.2 meq/L (ref 3.5–5.1)
Sodium: 142 mEq/L (ref 136–145)
Total Bilirubin: 1.29 mg/dL — ABNORMAL HIGH (ref 0.20–1.20)
Total Protein: 6.4 g/dL (ref 6.4–8.3)

## 2016-11-23 MED ORDER — IOPAMIDOL (ISOVUE-300) INJECTION 61%
75.0000 mL | Freq: Once | INTRAVENOUS | Status: AC | PRN
  Administered 2016-11-23: 75 mL via INTRAVENOUS

## 2016-11-27 ENCOUNTER — Ambulatory Visit (HOSPITAL_BASED_OUTPATIENT_CLINIC_OR_DEPARTMENT_OTHER): Payer: Medicare Other | Admitting: Internal Medicine

## 2016-11-27 ENCOUNTER — Telehealth: Payer: Self-pay | Admitting: Internal Medicine

## 2016-11-27 ENCOUNTER — Encounter: Payer: Self-pay | Admitting: Internal Medicine

## 2016-11-27 VITALS — BP 112/95 | HR 94 | Temp 98.2°F | Resp 18 | Ht 62.0 in | Wt 115.7 lb

## 2016-11-27 DIAGNOSIS — R21 Rash and other nonspecific skin eruption: Secondary | ICD-10-CM | POA: Diagnosis not present

## 2016-11-27 DIAGNOSIS — R0609 Other forms of dyspnea: Secondary | ICD-10-CM

## 2016-11-27 DIAGNOSIS — Z5181 Encounter for therapeutic drug level monitoring: Secondary | ICD-10-CM

## 2016-11-27 DIAGNOSIS — R5383 Other fatigue: Secondary | ICD-10-CM

## 2016-11-27 DIAGNOSIS — L03113 Cellulitis of right upper limb: Secondary | ICD-10-CM

## 2016-11-27 DIAGNOSIS — C3491 Malignant neoplasm of unspecified part of right bronchus or lung: Secondary | ICD-10-CM

## 2016-11-27 NOTE — Telephone Encounter (Signed)
Gave patient avs report and appointments for January  °

## 2016-11-27 NOTE — Progress Notes (Signed)
[facility] [phone]   Fax:([phone]) S[place], MD [place] [address]  DIAGNOSIS: Stage IIIB (T2a, N3, M0) non-small cell lung cancer, adenocarcinoma with positive EGFR mutation diagnosed in January 2010.  PRIOR THERAPY: 1) Status post concurrent chemoradiation with weekly carboplatin and paclitaxel; last dose given March 21, 2009.  2) Tarceva at 150 mg p.o. daily, status post approximately 48 months of treatment, discontinued secondary to persistent diarrhea.  3) status post right Pleurx catheter placement for right nonmalignant pleural effusion.  CURRENT THERAPY: Tarceva 100 mg by mouth daily started 03/19/2013, status post surgery 40 months of treatment.  INTERVAL HISTORY: Dana [name] 81 y.o. female returns to the clinic today for two-month follow-up visit. The patient has several complaints today including progressive skin rash on the right forearm that has been getting worse over the last 2 months. She also has increasing fatigue and shortness of breath with exertion. She is running around to some financial topically for coverage of Tarceva starting January 2018. She is working with [employer] as well as our Surveyor, mining for coverage of her Tarceva. Her weight has been stable. She denied having any significant chest pain, cough or hemoptysis. She has no nausea or vomiting. She denied having any diarrhea. The patient had repeat CT scan of the chest performed recently and she is here for evaluation and discussion of her scan results.  MEDICAL HISTORY: Past Medical History:  Diagnosis Date  . Arthritis    HANDS,  WRISTS  . COPD (chronic obstructive pulmonary disease) ([place])   . Dyspnea on exertion   . Encounter for therapeutic drug monitoring 10/01/2016  . GERD (gastroesophageal reflux disease)   . Hemorrhoid   . History of anal fissures   . History of lung  cancer ONCOLOGIST--  DR [facility]--  LAST CT ,  NO RECURRENCE OR METS   DX JAN 2010 --  STAGE IIIA  NON-SMALL CELL ADENOCARCINOMA (RIGHT MIDDLE LOBE)---  S/P  CHEMORADIATIO THERAPY  (COMPLETE 03-21-2009)  . lung ca dx'd 2010  . Normal cardiac stress test    2007  PER PT  . Osteoporosis   . Rash, skin    RIGHT FOREARM/ HAND  . Right wrist pain    MASS  . Wears glasses     ALLERGIES:  is allergic to amoxicillin.  MEDICATIONS:  Current Outpatient Prescriptions  Medication Sig Dispense Refill  . aspirin EC 81 MG tablet Take 81 mg by mouth daily.    Marland Kitchen bismuth subsalicylate (PEPTO BISMOL) 262 MG/15ML suspension Take 30 mLs by mouth every three (3) days as needed.    . Calcium Carbonate-Vitamin D 600-400 MG-UNIT per chew tablet Chew 1 tablet by mouth 2 (two) times daily.    . cholecalciferol (VITAMIN D) 1000 UNITS tablet Take 1,000 Units by mouth daily.     . clindamycin (CLEOCIN-T) 1 % external solution Apply topically 2 (two) times daily. 30 mL 0  . Coenzyme Q10 (COQ10) 100 MG CAPS Take 1 capsule by mouth daily.     . Cyanocobalamin (B-12) 1000 MCG CAPS Take 1,000 mcg by mouth 3 (three) times daily with meals.     . erlotinib (TARCEVA) 100 MG tablet Take 1 tablet (100 mg total) by mouth every evening. Take on an empty stomach 1 hour before meals or 2 hours after 30 tablet 2  . Hydrocodone-Acetaminophen (VICODIN) 5-300 MG TABS Take 1 tablet by mouth  4 (four) times daily as needed (for pain).     . hydroxychloroquine (PLAQUENIL) 200 MG tablet Take 400 mg by mouth every morning.     . loperamide (IMODIUM A-D) 2 MG tablet Take 1 mg by mouth 4 (four) times daily as needed for diarrhea or loose stools.     Marland Kitchen loratadine (CLARITIN) 10 MG tablet Take 10 mg by mouth daily as needed for allergies.     Marland Kitchen LORazepam (ATIVAN) 0.5 MG tablet Take 1 tablet (0.5 mg total) by mouth every 8 (eight) hours as needed for anxiety. (Patient not taking: Reported on 10/01/2016) 30 tablet 0  . oxyCODONE (OXY  IR/ROXICODONE) 5 MG immediate release tablet Take 1 tablet (5 mg total) by mouth every 4 (four) hours as needed for moderate pain. (Patient not taking: Reported on 10/01/2016) 30 tablet 0  . Polyethyl Glycol-Propyl Glycol (SYSTANE OP) Place 1 drop into both eyes at bedtime as needed (for dry eyes.).     Marland Kitchen psyllium (METAMUCIL) 58.6 % powder Take 1 packet by mouth daily as needed.    . Simethicone (GAS-X PO) Take 1 tablet by mouth 2 (two) times daily as needed (for gas).      No current facility-administered medications for this visit.     SURGICAL HISTORY:  Past Surgical History:  Procedure Laterality Date  . ANAL FISSURE REPAIR  07/09/2011   INTERNAL SPHINCTEROTOMY  . BENIGN EXCISION LEFT BREAST CENTRAL DUCT  02/1999  . CHEST TUBE INSERTION Right 07/14/2014   Procedure: INSERTION PLEURAL DRAINAGE CATHETER RIGHT CHEST;  Surgeon: Grace Isaac, MD;  Location: [facility];  Service: Thoracic;  Laterality: Right;  . EXCISION RIGHT WRIST MASS  2012  . KNEE ARTHROSCOPY Right 1999  . MASS EXCISION Right 03/11/2014   Procedure: RIGHT WRIST DEEP MASS EXCISION WITH CULTURE AND BIOSPY;  Surgeon: Linna Hoff, MD;  Location: [facility];  Service: Orthopedics;  Laterality: Right;  . REMOVAL OF PLEURAL DRAINAGE CATHETER Right 10/14/2014   Procedure: REMOVAL OF PLEURAL DRAINAGE CATHETER;  Surgeon: Grace Isaac, MD;  Location: [facility];  Service: Thoracic;  Laterality: Right;  . TALC PLEURODESIS Right 09/16/2014   Procedure: TALC PLEURADESIS/ slurry;  Surgeon: Grace Isaac, MD;  Location: [facility];  Service: Thoracic;  Laterality: Right;  . THORACOSCOPY  01-26-2009   w/   lung/  node biopsy's  . THUMB ARTHROSCOPY Left    - removed bone spur  . TONSILLECTOMY  AS CHILD  . TOTAL ABDOMINAL HYSTERECTOMY W/ BILATERAL SALPINGOOPHORECTOMY  1982   W/  APPENDECTOMY  . TRANSTHORACIC ECHOCARDIOGRAM  12-23-2008   NORMAL LV/  EF 65-70%/  MILD MR  &  TR  . VAULT SUSPENSION PLUS CYSTOCELE REPAIR WITH  GRAFT  06-13-2010    REVIEW OF SYSTEMS:  Constitutional: positive for fatigue Eyes: negative Ears, nose, mouth, throat, and face: negative for hoarseness, sore mouth and sore throat Respiratory: positive for dyspnea on exertion, negative for cough, hemoptysis and wheezing Cardiovascular: negative for orthopnea and palpitations Gastrointestinal: negative for constipation, diarrhea, nausea and vomiting Genitourinary:negative for dysuria, frequency and hematuria Integument/breast: positive for dryness and rash Hematologic/lymphatic: negative Musculoskeletal:negative for bone pain and muscle weakness Neurological: negative for dizziness, headaches, seizures and vertigo Behavioral/Psych: negative Endocrine: negative Allergic/Immunologic: negative   PHYSICAL EXAMINATION: General appearance: alert, cooperative, fatigued and no distress Head: Normocephalic, without obvious abnormality, atraumatic Neck: no adenopathy, no JVD, supple, symmetrical, trachea midline and thyroid not enlarged, symmetric, no tenderness/mass/nodules Lymph nodes: Cervical, supraclavicular, and axillary nodes  normal. Resp: clear to auscultation bilaterally Back: symmetric, no curvature. ROM normal. No CVA tenderness. Cardio: regular rate and rhythm, S1, S2 normal, no murmur, click, rub or gallop GI: soft, non-tender; bowel sounds normal; no masses,  no organomegaly Extremities: extremities normal, atraumatic, no cyanosis or edema Neurologic: Alert and oriented X 3, normal strength and tone. Normal symmetric reflexes. Normal coordination and gait  Skin exam sclerae skin rash on the right forearm.     ECOG PERFORMANCE STATUS: 1 - Symptomatic but completely ambulatory  Blood pressure (!) 112/95, pulse 94, temperature 98.2 F (36.8 C), temperature source Oral, resp. rate 18, height _0  (1.575 m), weight 115 lb 11.2 oz (52.5 kg), SpO2 99 %.  LABORATORY DATA: Lab Results  Component Value Date   WBC 5.7 11/23/2016     HGB 12.9 11/23/2016   HCT 39.0 11/23/2016   MCV 94.6 11/23/2016   PLT 276 11/23/2016      Chemistry      Component Value Date/Time   NA 142 11/23/2016 0923   K 4.2 11/23/2016 0923   CL 98 07/05/2016 1012   CL 108 (H) 06/03/2013 0939   CO2 24 11/23/2016 0923   BUN 22.3 11/23/2016 0923   CREATININE 0.9 11/23/2016 0923      Component Value Date/Time   CALCIUM 9.4 11/23/2016 0923   ALKPHOS 66 11/23/2016 0923   AST 33 11/23/2016 0923   ALT 29 11/23/2016 0923   BILITOT 1.29 (H) 11/23/2016 0923       RADIOGRAPHIC STUDIES: Ct Chest W Contrast  Result Date: 11/23/2016 CLINICAL DATA:  Right-sided lung cancer. EXAM: CT CHEST WITH CONTRAST TECHNIQUE: Multidetector CT imaging of the chest was performed during intravenous contrast administration. CONTRAST:  92m ISOVUE-300 IOPAMIDOL (ISOVUE-300) INJECTION 61% COMPARISON:  07/05/2016.  04/02/2016. FINDINGS: Cardiovascular: The heart size is normal. No pericardial effusion. Coronary artery calcification is noted. Atherosclerotic calcification is noted in the wall of the thoracic aorta. Mediastinum/Nodes: As before, there is abnormal soft tissue attenuation in the central mediastinum. Previous study measured a 2.8 x 2.4 cm collection of soft tissue posterior to the left atrium which included the esophagus. Measuring at the same level today , this measures 1.7 x 1.7 cm. A discrete 10 mm short axis subcarinal lymph node is now visible on image 70 series 2, likely related to differential enhancement given the later bullous today for today's study. These lymph nodes now appears similar to the study from 04/02/2016. Small scattered subpectoral and axillary lymph nodes are noted bilaterally, but do not meet CT criteria for pathologic enlargement. Lungs/Pleura: Postradiation fibrosis again noted in the medial and parahilar right lung, stable in the interval. Scarring at the right lung base is unchanged. Anterior left upper lobe nodule measured previously at  7 mm is unchanged. No new or progressive pulmonary nodule or mass. No overt pulmonary edema. No pleural effusion. Upper Abdomen: Abdominal aortic atherosclerosis. Otherwise unremarkable. Musculoskeletal: Bone windows reveal no worrisome lytic or sclerotic osseous lesions. IMPRESSION: 1. Interval improvement in the a amorphous soft tissue identified in the central mediastinum on the prior study. Mediastinal all anatomy now closely resembles the appearance on 04/02/2016. No evidence for new or progressive mediastinal or hilar disease. 2. Stable appearance post radiation changes medial right lung. 3. Stable appearance subpleural anterior left upper lobe ground-glass nodule comparing back to 04/02/2016. 4. Coronary artery and thoracic aortic atherosclerosis. Electronically Signed   By: EMisty StanleyM.D.   On: 11/23/2016 14:37    ASSESSMENT AND PLAN: This is  a very pleasant 81 years old white female with   1) Stage IIIB non-small cell lung cancer, adenocarcinoma with positive EGFR mutation. She is status post a course of concurrent chemoradiation. She was started on treatment with Tarceva 150 mg by mouth daily for 48 months but this was a change at 200 mg secondary to adverse effects. The patient is currently on treatment with oral Tarceva 100 mg by mouth daily status post 40 months of this treatment. She will start this treatment well except for the concerning skin rash on the right forearm and also small areas on the chest. She had a recent CT scan of the chest. I personally and independently reviewed the scan and discuss the results with the patient today. It showed some improvement of the mediastinal soft tissue mass. I recommended for the patient to continue on her treatment with Tarceva for now but because of the concerning skin rash, I advised her to take Tarceva every other day for now. 2) right forearm skin rash. This could be secondary to treatment with Tarceva but the patient had the rash for right  now. I will start the patient on Medrol Dosepak in addition to doxycycline 100 mg by mouth twice a day for 10 days. I also advised her to apply hydrocortisone cream topically. 3) financial coverage for Tarceva: The patient will see the oral chemotherapy pharmacist to discuss options for coverage of her treatment. She has enough coverage until early next. She is also working with other English as a second language teacher. She will come back for follow-up visit in one month for evaluation and repeat blood work. She was advised to call immediately if she has any concerning symptoms in the interval.  The patient voices understanding of current disease status and treatment options and is in agreement with the current care plan.  All questions were answered. The patient knows to call the clinic with any problems, questions or concerns. We can certainly see the patient much sooner if necessary.  Disclaimer: This note was dictated with voice recognition software. Similar sounding words can inadvertently be transcribed and may not be corrected upon review.

## 2016-11-28 ENCOUNTER — Encounter: Payer: Self-pay | Admitting: Internal Medicine

## 2016-11-28 ENCOUNTER — Other Ambulatory Visit: Payer: Self-pay | Admitting: Medical Oncology

## 2016-11-28 DIAGNOSIS — L270 Generalized skin eruption due to drugs and medicaments taken internally: Secondary | ICD-10-CM

## 2016-11-28 MED ORDER — METHYLPREDNISOLONE 4 MG PO TBPK: ORAL_TABLET | ORAL | 0 refills | Status: DC

## 2016-11-28 MED ORDER — DOXYCYCLINE HYCLATE 50 MG PO CAPS: 100.0000 mg | ORAL_CAPSULE | Freq: Two times a day (BID) | ORAL | 0 refills | Status: DC

## 2016-11-28 NOTE — Progress Notes (Signed)
Per [facility], pt's Tarceva was shipped on 11/26/16, tracking #: [id number].   She has 1 refill left.

## 2016-12-26 ENCOUNTER — Other Ambulatory Visit: Payer: Self-pay | Admitting: Medical Oncology

## 2016-12-26 DIAGNOSIS — C3491 Malignant neoplasm of unspecified part of right bronchus or lung: Secondary | ICD-10-CM

## 2016-12-26 MED ORDER — ERLOTINIB HCL 100 MG PO TABS: 100.0000 mg | ORAL_TABLET | ORAL | 0 refills | Status: DC

## 2016-12-27 ENCOUNTER — Telehealth: Payer: Self-pay | Admitting: Internal Medicine

## 2016-12-27 ENCOUNTER — Other Ambulatory Visit (HOSPITAL_BASED_OUTPATIENT_CLINIC_OR_DEPARTMENT_OTHER): Payer: Medicare Other

## 2016-12-27 ENCOUNTER — Encounter: Payer: Self-pay | Admitting: Internal Medicine

## 2016-12-27 ENCOUNTER — Ambulatory Visit (HOSPITAL_BASED_OUTPATIENT_CLINIC_OR_DEPARTMENT_OTHER): Payer: Medicare Other | Admitting: Internal Medicine

## 2016-12-27 VITALS — BP 151/73 | HR 96 | Temp 98.4°F | Resp 18 | Ht 62.0 in | Wt 115.6 lb

## 2016-12-27 DIAGNOSIS — I10 Essential (primary) hypertension: Secondary | ICD-10-CM

## 2016-12-27 DIAGNOSIS — R21 Rash and other nonspecific skin eruption: Secondary | ICD-10-CM

## 2016-12-27 DIAGNOSIS — C3491 Malignant neoplasm of unspecified part of right bronchus or lung: Secondary | ICD-10-CM

## 2016-12-27 DIAGNOSIS — L03113 Cellulitis of right upper limb: Secondary | ICD-10-CM

## 2016-12-27 DIAGNOSIS — Z5181 Encounter for therapeutic drug level monitoring: Secondary | ICD-10-CM

## 2016-12-27 LAB — CBC WITH DIFFERENTIAL/PLATELET
BASO%: 0.3 % (ref 0.0–2.0)
BASOS ABS: 0 10*3/uL (ref 0.0–0.1)
EOS ABS: 0.3 10*3/uL (ref 0.0–0.5)
EOS%: 4.9 % (ref 0.0–7.0)
HCT: 37.9 % (ref 34.8–46.6)
HEMOGLOBIN: 12.6 g/dL (ref 11.6–15.9)
LYMPH#: 0.7 10*3/uL — AB (ref 0.9–3.3)
LYMPH%: 10.4 % — ABNORMAL LOW (ref 14.0–49.7)
MCH: 31.2 pg (ref 25.1–34.0)
MCHC: 33.2 g/dL (ref 31.5–36.0)
MCV: 93.8 fL (ref 79.5–101.0)
MONO#: 0.5 10*3/uL (ref 0.1–0.9)
MONO%: 8 % (ref 0.0–14.0)
NEUT#: 4.8 10*3/uL (ref 1.5–6.5)
NEUT%: 76.4 % (ref 38.4–76.8)
PLATELETS: 293 10*3/uL (ref 145–400)
RBC: 4.04 10*6/uL (ref 3.70–5.45)
RDW: 14.1 % (ref 11.2–14.5)
WBC: 6.3 10*3/uL (ref 3.9–10.3)

## 2016-12-27 LAB — COMPREHENSIVE METABOLIC PANEL
ALT: 20 U/L (ref 0–55)
AST: 25 U/L (ref 5–34)
Albumin: 3.6 g/dL (ref 3.5–5.0)
Alkaline Phosphatase: 68 U/L (ref 40–150)
Anion Gap: 9 mEq/L (ref 3–11)
BILIRUBIN TOTAL: 0.64 mg/dL (ref 0.20–1.20)
BUN: 19.4 mg/dL (ref 7.0–26.0)
CO2: 26 mEq/L (ref 22–29)
CREATININE: 0.8 mg/dL (ref 0.6–1.1)
Calcium: 9.5 mg/dL (ref 8.4–10.4)
Chloride: 105 mEq/L (ref 98–109)
EGFR: 67 mL/min/{1.73_m2} — ABNORMAL LOW (ref >90)
GLUCOSE: 85 mg/dL (ref 70–140)
Potassium: 4.2 mEq/L (ref 3.5–5.1)
SODIUM: 140 meq/L (ref 136–145)
TOTAL PROTEIN: 6.2 g/dL — AB (ref 6.4–8.3)

## 2016-12-27 NOTE — Progress Notes (Signed)
[facility] Telephone:(336) [phone]   Fax:(336) Ali Molina, MD [place] [address]  DIAGNOSIS: Stage IIIB (T2a, N3, M0) non-small cell lung cancer, adenocarcinoma with positive EGFR mutation diagnosed in January 2010.  PRIOR THERAPY: 1) Status post concurrent chemoradiation with weekly carboplatin and paclitaxel; last dose given March 21, 2009.  2) Tarceva at 150 mg p.o. daily, status post approximately 48 months of treatment, discontinued secondary to persistent diarrhea.  3) status post right Pleurx catheter placement for right nonmalignant pleural effusion.  CURRENT THERAPY: Tarceva 100 mg by mouth daily started 03/19/2013, status post surgery 41 months of treatment.  INTERVAL HISTORY: Dana [name] 82 y.o. female came to the clinic today accompanied by her husband for follow-up visit. The patient is feeling much better and the skin rash has significantly improved after we changed her dose of Tarceva to 100 mg every other day for the last month. She was also treated with doxycycline, Medrol Dosepak and hydrocortisone cream. She denied having any other significant complaints except for shortness of breath with exertion. She denied having any chest pain, cough or hemoptysis. She has no significant weight loss or night sweats. She has no nausea, vomiting, diarrhea or constipation. She is here today for evaluation and repeat blood work for monitoring of her treatment.   MEDICAL HISTORY: Past Medical History:  Diagnosis Date  . Arthritis    HANDS,  WRISTS  . COPD (chronic obstructive pulmonary disease) ([place])   . Dyspnea on exertion   . Encounter for therapeutic drug monitoring 10/01/2016  . GERD (gastroesophageal reflux disease)   . Hemorrhoid   . History of anal fissures   . History of lung cancer ONCOLOGIST--  DR [facility]--  LAST CT ,  NO RECURRENCE OR METS   DX JAN 2010 --  STAGE IIIA  NON-SMALL CELL  ADENOCARCINOMA (RIGHT MIDDLE LOBE)---  S/P  CHEMORADIATIO THERAPY  (COMPLETE 03-21-2009)  . lung ca dx'd 2010  . Normal cardiac stress test    2007  PER PT  . Osteoporosis   . Rash, skin    RIGHT FOREARM/ HAND  . Right wrist pain    MASS  . Wears glasses     ALLERGIES:  is allergic to amoxicillin.  MEDICATIONS:  Current Outpatient Prescriptions  Medication Sig Dispense Refill  . aspirin EC 81 MG tablet Take 81 mg by mouth daily.    Marland Kitchen bismuth subsalicylate (PEPTO BISMOL) 262 MG/15ML suspension Take 30 mLs by mouth every three (3) days as needed.    . Calcium Carbonate-Vitamin D 600-400 MG-UNIT per chew tablet Chew 1 tablet by mouth 2 (two) times daily.    . cholecalciferol (VITAMIN D) 1000 UNITS tablet Take 1,000 Units by mouth daily.     . clindamycin (CLEOCIN-T) 1 % external solution Apply topically 2 (two) times daily. 30 mL 0  . Coenzyme Q10 (COQ10) 100 MG CAPS Take 1 capsule by mouth daily.     . Cyanocobalamin (B-12) 1000 MCG CAPS Take 1,000 mcg by mouth 3 (three) times daily with meals.     Marland Kitchen doxycycline (VIBRAMYCIN) 50 MG capsule Take 2 capsules (100 mg total) by mouth 2 (two) times daily. 20 capsule 0  . erlotinib (TARCEVA) 100 MG tablet Take 1 tablet (100 mg total) by mouth every other day. Take on an empty stomach 1 hour before meals or 2 hours after 30 tablet 0  . Hydrocodone-Acetaminophen (VICODIN) 5-300 MG TABS Take  1 tablet by mouth 4 (four) times daily as needed (for pain).     . hydroxychloroquine (PLAQUENIL) 200 MG tablet Take 400 mg by mouth every morning.     . loperamide (IMODIUM A-D) 2 MG tablet Take 1 mg by mouth 4 (four) times daily as needed for diarrhea or loose stools.     Marland Kitchen loratadine (CLARITIN) 10 MG tablet Take 10 mg by mouth daily as needed for allergies.     Marland Kitchen LORazepam (ATIVAN) 0.5 MG tablet Take 1 tablet (0.5 mg total) by mouth every 8 (eight) hours as needed for anxiety. 30 tablet 0  . methylPREDNISolone (MEDROL DOSEPAK) 4 MG TBPK tablet Take per  package instructions. 21 tablet 0  . oxyCODONE (OXY IR/ROXICODONE) 5 MG immediate release tablet Take 1 tablet (5 mg total) by mouth every 4 (four) hours as needed for moderate pain. 30 tablet 0  . Polyethyl Glycol-Propyl Glycol (SYSTANE OP) Place 1 drop into both eyes at bedtime as needed (for dry eyes.).     Marland Kitchen psyllium (METAMUCIL) 58.6 % powder Take 1 packet by mouth daily as needed.    . Simethicone (GAS-X PO) Take 1 tablet by mouth 2 (two) times daily as needed (for gas).      No current facility-administered medications for this visit.     SURGICAL HISTORY:  Past Surgical History:  Procedure Laterality Date  . ANAL FISSURE REPAIR  07/09/2011   INTERNAL SPHINCTEROTOMY  . BENIGN EXCISION LEFT BREAST CENTRAL DUCT  02/1999  . CHEST TUBE INSERTION Right 07/14/2014   Procedure: INSERTION PLEURAL DRAINAGE CATHETER RIGHT CHEST;  Surgeon: Grace Isaac, MD;  Location: [facility];  Service: Thoracic;  Laterality: Right;  . EXCISION RIGHT WRIST MASS  2012  . KNEE ARTHROSCOPY Right 1999  . MASS EXCISION Right 03/11/2014   Procedure: RIGHT WRIST DEEP MASS EXCISION WITH CULTURE AND BIOSPY;  Surgeon: Linna Hoff, MD;  Location: [facility];  Service: Orthopedics;  Laterality: Right;  . REMOVAL OF PLEURAL DRAINAGE CATHETER Right 10/14/2014   Procedure: REMOVAL OF PLEURAL DRAINAGE CATHETER;  Surgeon: Grace Isaac, MD;  Location: [facility];  Service: Thoracic;  Laterality: Right;  . TALC PLEURODESIS Right 09/16/2014   Procedure: TALC PLEURADESIS/ slurry;  Surgeon: Grace Isaac, MD;  Location: [facility];  Service: Thoracic;  Laterality: Right;  . THORACOSCOPY  01-26-2009   w/   lung/  node biopsy's  . THUMB ARTHROSCOPY Left    - removed bone spur  . TONSILLECTOMY  AS CHILD  . TOTAL ABDOMINAL HYSTERECTOMY W/ BILATERAL SALPINGOOPHORECTOMY  1982   W/  APPENDECTOMY  . TRANSTHORACIC ECHOCARDIOGRAM  12-23-2008   NORMAL LV/  EF 65-70%/  MILD MR  &  TR  . VAULT SUSPENSION PLUS CYSTOCELE  REPAIR WITH GRAFT  06-13-2010    REVIEW OF SYSTEMS:  A comprehensive review of systems was negative except for: Constitutional: positive for fatigue Respiratory: positive for dyspnea on exertion Integument/breast: positive for rash   PHYSICAL EXAMINATION: General appearance: alert, cooperative, fatigued and no distress Head: Normocephalic, without obvious abnormality, atraumatic Neck: no adenopathy, no JVD, supple, symmetrical, trachea midline and thyroid not enlarged, symmetric, no tenderness/mass/nodules Lymph nodes: Cervical, supraclavicular, and axillary nodes normal. Resp: clear to auscultation bilaterally Back: symmetric, no curvature. ROM normal. No CVA tenderness. Cardio: regular rate and rhythm, S1, S2 normal, no murmur, click, rub or gallop GI: soft, non-tender; bowel sounds normal; no masses,  no organomegaly Extremities: extremities normal, atraumatic, no cyanosis or edema  Skin  exam sclerae skin rash on the right forearm improved compared to a few weeks ago.    ECOG PERFORMANCE STATUS: 1 - Symptomatic but completely ambulatory  Blood pressure (!) 151/73, pulse 96, temperature 98.4 F (36.9 C), temperature source Oral, resp. rate 18, height '5\' 2"'$  (1.575 m), weight 115 lb 9.6 oz (52.4 kg), SpO2 98 %.  LABORATORY DATA: Lab Results  Component Value Date   WBC 6.3 12/27/2016   HGB 12.6 12/27/2016   HCT 37.9 12/27/2016   MCV 93.8 12/27/2016   PLT 293 12/27/2016      Chemistry      Component Value Date/Time   NA 142 11/23/2016 0923   K 4.2 11/23/2016 0923   CL 98 07/05/2016 1012   CL 108 (H) 06/03/2013 0939   CO2 24 11/23/2016 0923   BUN 22.3 11/23/2016 0923   CREATININE 0.9 11/23/2016 0923      Component Value Date/Time   CALCIUM 9.4 11/23/2016 0923   ALKPHOS 66 11/23/2016 0923   AST 33 11/23/2016 0923   ALT 29 11/23/2016 0923   BILITOT 1.29 (H) 11/23/2016 0923       RADIOGRAPHIC STUDIES: No results found.  ASSESSMENT AND PLAN:  This is a very  pleasant 82 years old white female with stage IIIB non-small cell lung cancer, adenocarcinoma with positive EGFR mutation. She is currently on treatment with Tarceva 100 mg by mouth daily status post 41 months of treatment. For the last few weeks her treatment was 100 mg by mouth every other day because of the significant skin rash in her right upper extremity. Her skin rash is much better after reducing the dose and also after application of hydrocortisone cream and treatment with doxycycline and Medrol Dosepak. I recommended for the patient to continue her current treatment with Tarceva with the current dose. I would see her back for follow-up visit in one month's for reevaluation with repeat blood work. For hypertension she will monitor her blood pressure closed at home and if it continues to be elevated to consult with her primary care physician for consideration of treatment. The patient was advised to call immediately if she has any concerning symptoms in the interval. The patient voices understanding of current disease status and treatment options and is in agreement with the current care plan.  All questions were answered. The patient knows to call the clinic with any problems, questions or concerns. We can certainly see the patient much sooner if necessary. I spent 10 minutes counseling the patient face to face. The total time spent in the appointment was 15 minutes. Disclaimer: This note was dictated with voice recognition software. Similar sounding words can inadvertently be transcribed and may not be corrected upon review.

## 2016-12-27 NOTE — Telephone Encounter (Signed)
Patient inquired about Financial services and was escorted to [facility] in managed Care/[facility]. Appointments scheduled per 12/27/16 los. Patient was given a copy of the AVS report and appointment schedule, per 12/27/16.

## 2016-12-27 NOTE — Progress Notes (Signed)
Pt is approved w/ PAN for Tarceva from 12/27/16 to 12/26/17 for $4,800.

## 2017-01-01 ENCOUNTER — Telehealth: Payer: Self-pay | Admitting: Medical Oncology

## 2017-01-01 ENCOUNTER — Other Ambulatory Visit: Payer: Self-pay | Admitting: Medical Oncology

## 2017-01-01 DIAGNOSIS — C3491 Malignant neoplasm of unspecified part of right bronchus or lung: Secondary | ICD-10-CM

## 2017-01-01 MED ORDER — ERLOTINIB HCL 100 MG PO TABS: 100.0000 mg | ORAL_TABLET | ORAL | 0 refills | Status: DC

## 2017-01-01 NOTE — Telephone Encounter (Signed)
I returned call re tarceva, no answer.

## 2017-01-02 ENCOUNTER — Other Ambulatory Visit: Payer: Self-pay | Admitting: Medical Oncology

## 2017-01-02 ENCOUNTER — Telehealth: Payer: Self-pay | Admitting: Medical Oncology

## 2017-01-02 DIAGNOSIS — C3491 Malignant neoplasm of unspecified part of right bronchus or lung: Secondary | ICD-10-CM

## 2017-01-02 MED ORDER — ERLOTINIB HCL 100 MG PO TABS: 100.0000 mg | ORAL_TABLET | Freq: Every day | ORAL | 1 refills | Status: DC

## 2017-01-02 NOTE — Telephone Encounter (Signed)
Faxed tarceva to [facility]

## 2017-01-04 ENCOUNTER — Encounter: Payer: Self-pay | Admitting: Internal Medicine

## 2017-01-04 NOTE — Progress Notes (Signed)
[facility] shipped Tarceva to pt on 01/03/17; tracking #: [id number].

## 2017-01-28 ENCOUNTER — Other Ambulatory Visit: Payer: Self-pay | Admitting: Emergency Medicine

## 2017-01-29 ENCOUNTER — Other Ambulatory Visit (HOSPITAL_BASED_OUTPATIENT_CLINIC_OR_DEPARTMENT_OTHER): Payer: Medicare Other

## 2017-01-29 ENCOUNTER — Ambulatory Visit (HOSPITAL_BASED_OUTPATIENT_CLINIC_OR_DEPARTMENT_OTHER): Payer: Medicare Other | Admitting: Internal Medicine

## 2017-01-29 ENCOUNTER — Telehealth: Payer: Self-pay | Admitting: Internal Medicine

## 2017-01-29 ENCOUNTER — Encounter: Payer: Self-pay | Admitting: Internal Medicine

## 2017-01-29 VITALS — BP 129/74 | HR 84 | Temp 98.0°F | Resp 18 | Ht 62.0 in | Wt 118.6 lb

## 2017-01-29 DIAGNOSIS — C3491 Malignant neoplasm of unspecified part of right bronchus or lung: Secondary | ICD-10-CM

## 2017-01-29 DIAGNOSIS — R21 Rash and other nonspecific skin eruption: Secondary | ICD-10-CM | POA: Diagnosis not present

## 2017-01-29 DIAGNOSIS — Z5181 Encounter for therapeutic drug level monitoring: Secondary | ICD-10-CM

## 2017-01-29 DIAGNOSIS — L03113 Cellulitis of right upper limb: Secondary | ICD-10-CM

## 2017-01-29 DIAGNOSIS — I4891 Unspecified atrial fibrillation: Secondary | ICD-10-CM

## 2017-01-29 DIAGNOSIS — I10 Essential (primary) hypertension: Secondary | ICD-10-CM | POA: Diagnosis not present

## 2017-01-29 LAB — CBC WITH DIFFERENTIAL/PLATELET
BASO%: 0.4 % (ref 0.0–2.0)
Basophils Absolute: 0 10*3/uL (ref 0.0–0.1)
EOS%: 5.1 % (ref 0.0–7.0)
Eosinophils Absolute: 0.3 10*3/uL (ref 0.0–0.5)
HCT: 37.3 % (ref 34.8–46.6)
HGB: 12.6 g/dL (ref 11.6–15.9)
LYMPH%: 8.6 % — ABNORMAL LOW (ref 14.0–49.7)
MCH: 31.6 pg (ref 25.1–34.0)
MCHC: 33.7 g/dL (ref 31.5–36.0)
MCV: 93.8 fL (ref 79.5–101.0)
MONO#: 0.7 10*3/uL (ref 0.1–0.9)
MONO%: 9.9 % (ref 0.0–14.0)
NEUT#: 5 10*3/uL (ref 1.5–6.5)
NEUT%: 76 % (ref 38.4–76.8)
Platelets: 259 10*3/uL (ref 145–400)
RBC: 3.98 10*6/uL (ref 3.70–5.45)
RDW: 14.2 % (ref 11.2–14.5)
WBC: 6.6 10*3/uL (ref 3.9–10.3)
lymph#: 0.6 10*3/uL — ABNORMAL LOW (ref 0.9–3.3)

## 2017-01-29 LAB — COMPREHENSIVE METABOLIC PANEL
ALT: 26 U/L (ref 0–55)
AST: 31 U/L (ref 5–34)
Albumin: 3.5 g/dL (ref 3.5–5.0)
Alkaline Phosphatase: 66 U/L (ref 40–150)
Anion Gap: 8 mEq/L (ref 3–11)
BUN: 21.7 mg/dL (ref 7.0–26.0)
CO2: 27 mEq/L (ref 22–29)
Calcium: 9.6 mg/dL (ref 8.4–10.4)
Chloride: 106 mEq/L (ref 98–109)
Creatinine: 0.8 mg/dL (ref 0.6–1.1)
EGFR: 70 mL/min/{1.73_m2} — ABNORMAL LOW (ref >90)
Glucose: 88 mg/dl (ref 70–140)
Potassium: 4.6 mEq/L (ref 3.5–5.1)
Sodium: 141 mEq/L (ref 136–145)
Total Bilirubin: 0.98 mg/dL (ref 0.20–1.20)
Total Protein: 6.1 g/dL — ABNORMAL LOW (ref 6.4–8.3)

## 2017-01-29 NOTE — Progress Notes (Signed)
[place] [phone]   Fax:([phone]) [place], [place] [address]  DIAGNOSIS: Stage IIIB (T2a, N3, M0) non-small cell lung cancer, adenocarcinoma with positive EGFR mutation diagnosed in January 2010.  PRIOR THERAPY: 1) Status post concurrent chemoradiation with weekly carboplatin and paclitaxel; last dose given March 21, 2009.  2) Tarceva at 150 mg p.o. daily, status post approximately 48 months of treatment, discontinued secondary to persistent diarrhea.  3) status post right Pleurx catheter placement for right nonmalignant pleural effusion.  CURRENT THERAPY: Tarceva 100 mg by mouth daily started 03/19/2013, status post 42 months of treatment.  INTERVAL HISTORY: Dana [name] 82 y.o. female returns to the clinic today for follow-up visit. The patient is currently on treatment with Tarceva 100 mg by mouth every other day because of the persistent skin rash on the right forearm. The rash is a little bit better. She denied having any chest pain, shortness of breath, cough or hemoptysis. She has no nausea, vomiting, diarrhea or constipation. She denied having any significant weight loss or night sweats. She continues to have generalized fatigue. She also has weakness in the lower extremities. The patient continues to exercise and doing water aerobics regularly. She is here today for evaluation and repeat blood work.  MEDICAL HISTORY: Past Medical History:  Diagnosis Date  . Arthritis    HANDS,  WRISTS  . COPD (chronic obstructive pulmonary disease) ([place])   . Dyspnea on exertion   . Encounter for therapeutic drug monitoring 10/01/2016  . GERD (gastroesophageal reflux disease)   . Hemorrhoid   . History of anal fissures   . History of lung cancer ONCOLOGIST--  DR [facility]--  LAST CT ,  NO RECURRENCE OR METS   DX JAN 2010 --  STAGE IIIA  NON-SMALL CELL ADENOCARCINOMA (RIGHT MIDDLE LOBE)---  S/P   CHEMORADIATIO THERAPY  (COMPLETE 03-21-2009)  . lung ca dx'd 2010  . Normal cardiac stress test    2007  PER PT  . Osteoporosis   . Rash, skin    RIGHT FOREARM/ HAND  . Right wrist pain    MASS  . Wears glasses     ALLERGIES:  is allergic to amoxicillin.  MEDICATIONS:  Current Outpatient Prescriptions  Medication Sig Dispense Refill  . aspirin EC 81 MG tablet Take 81 mg by mouth daily.    Marland Kitchen bismuth subsalicylate (PEPTO BISMOL) 262 MG/15ML suspension Take 30 mLs by mouth every three (3) days as needed.    . Calcium Carbonate-Vitamin D 600-400 MG-UNIT per chew tablet Chew 1 tablet by mouth 2 (two) times daily.    . cholecalciferol (VITAMIN D) 1000 UNITS tablet Take 1,000 Units by mouth daily.     . clindamycin (CLEOCIN-T) 1 % external solution Apply topically 2 (two) times daily. 30 mL 0  . Coenzyme Q10 (COQ10) 100 MG CAPS Take 1 capsule by mouth daily.     . Cyanocobalamin (B-12) 1000 MCG CAPS Take 1,000 mcg by mouth 3 (three) times daily with meals.     . erlotinib (TARCEVA) 100 MG tablet Take 1 tablet (100 mg total) by mouth daily. Take on an empty stomach 1 hour before meals or 2 hours after 30 tablet 1  . hydroxychloroquine (PLAQUENIL) 200 MG tablet Take 400 mg by mouth every morning.     . loratadine (CLARITIN) 10 MG tablet Take 10 mg by mouth daily as needed for allergies.     Marland Kitchen  LORazepam (ATIVAN) 0.5 MG tablet Take 1 tablet (0.5 mg total) by mouth every 8 (eight) hours as needed for anxiety. 30 tablet 0  . Polyethyl Glycol-Propyl Glycol (SYSTANE OP) Place 1 drop into both eyes at bedtime as needed (for dry eyes.).     Marland Kitchen psyllium (METAMUCIL) 58.6 % powder Take 1 packet by mouth daily as needed.    . Simethicone (GAS-X PO) Take 1 tablet by mouth 2 (two) times daily as needed (for gas).     . Hydrocodone-Acetaminophen (VICODIN) 5-300 MG TABS Take 1 tablet by mouth 4 (four) times daily as needed (for pain).     Marland Kitchen loperamide (IMODIUM A-D) 2 MG tablet Take 1 mg by mouth 4 (four) times  daily as needed for diarrhea or loose stools.     Marland Kitchen oxyCODONE (OXY IR/ROXICODONE) 5 MG immediate release tablet Take 1 tablet (5 mg total) by mouth every 4 (four) hours as needed for moderate pain. (Patient not taking: Reported on 01/29/2017) 30 tablet 0   No current facility-administered medications for this visit.     SURGICAL HISTORY:  Past Surgical History:  Procedure Laterality Date  . ANAL FISSURE REPAIR  07/09/2011   INTERNAL SPHINCTEROTOMY  . BENIGN EXCISION LEFT BREAST CENTRAL DUCT  02/1999  . CHEST TUBE INSERTION Right 07/14/2014   Procedure: INSERTION PLEURAL DRAINAGE CATHETER RIGHT CHEST;  Surgeon: Grace Isaac, MD;  Location: [facility];  Service: Thoracic;  Laterality: Right;  . EXCISION RIGHT WRIST MASS  2012  . KNEE ARTHROSCOPY Right 1999  . MASS EXCISION Right 03/11/2014   Procedure: RIGHT WRIST DEEP MASS EXCISION WITH CULTURE AND BIOSPY;  Surgeon: Linna Hoff, MD;  Location: [facility];  Service: Orthopedics;  Laterality: Right;  . REMOVAL OF PLEURAL DRAINAGE CATHETER Right 10/14/2014   Procedure: REMOVAL OF PLEURAL DRAINAGE CATHETER;  Surgeon: Grace Isaac, MD;  Location: [facility];  Service: Thoracic;  Laterality: Right;  . TALC PLEURODESIS Right 09/16/2014   Procedure: TALC PLEURADESIS/ slurry;  Surgeon: Grace Isaac, MD;  Location: [facility];  Service: Thoracic;  Laterality: Right;  . THORACOSCOPY  01-26-2009   w/   lung/  node biopsy's  . THUMB ARTHROSCOPY Left    - removed bone spur  . TONSILLECTOMY  AS CHILD  . TOTAL ABDOMINAL HYSTERECTOMY W/ BILATERAL SALPINGOOPHORECTOMY  1982   W/  APPENDECTOMY  . TRANSTHORACIC ECHOCARDIOGRAM  12-23-2008   NORMAL LV/  EF 65-70%/  MILD MR  &  TR  . VAULT SUSPENSION PLUS CYSTOCELE REPAIR WITH GRAFT  06-13-2010    REVIEW OF SYSTEMS:  A comprehensive review of systems was negative except for: Constitutional: positive for fatigue Musculoskeletal: positive for muscle weakness   PHYSICAL EXAMINATION: General  appearance: alert, cooperative, fatigued and no distress Head: Normocephalic, without obvious abnormality, atraumatic Neck: no adenopathy, no JVD, supple, symmetrical, trachea midline and thyroid not enlarged, symmetric, no tenderness/mass/nodules Lymph nodes: Cervical, supraclavicular, and axillary nodes normal. Resp: clear to auscultation bilaterally Back: symmetric, no curvature. ROM normal. No CVA tenderness. Cardio: regular rate and rhythm, S1, S2 normal, no murmur, click, rub or gallop GI: soft, non-tender; bowel sounds normal; no masses,  no organomegaly Extremities: extremities normal, atraumatic, no cyanosis or edema  ECOG PERFORMANCE STATUS: 1 - Symptomatic but completely ambulatory  Blood pressure 129/74, pulse 84, temperature 98 F (36.7 C), temperature source Oral, resp. rate 18, height '5\' 2"'$  (1.575 m), weight 118 lb 9.6 oz (53.8 kg), SpO2 100 %.  LABORATORY DATA: Lab Results  Component Value Date   WBC 6.6 01/29/2017   HGB 12.6 01/29/2017   HCT 37.3 01/29/2017   MCV 93.8 01/29/2017   PLT 259 01/29/2017      Chemistry      Component Value Date/Time   NA 141 01/29/2017 0922   K 4.6 01/29/2017 0922   CL 98 07/05/2016 1012   CL 108 (H) 06/03/2013 0939   CO2 27 01/29/2017 0922   BUN 21.7 01/29/2017 0922   CREATININE 0.8 01/29/2017 0922      Component Value Date/Time   CALCIUM 9.6 01/29/2017 0922   ALKPHOS 66 01/29/2017 0922   AST 31 01/29/2017 0922   ALT 26 01/29/2017 0922   BILITOT 0.98 01/29/2017 0922       RADIOGRAPHIC STUDIES: No results found.  ASSESSMENT AND PLAN:  This is a very pleasant 82 years old white female with stage IIIB non-small cell lung cancer with positive EGFR mutation diagnosed in January 2010. She status post course of concurrent chemoradiation. She has been on treatment with Tarceva initially at a dose of 150 mg by mouth daily for 48 months followed by reduced dose Tarceva 100 mg by mouth daily status post 42 months. Over the last  few weeks her dose has been giving every other day because of skin rash on the right forearm. I recommended for the patient to continue her treatment with the current dose for now. I will arrange for her to have repeat CT scan of the chest in one month for restaging of her disease on the new dose. She was advised to call immediately if she has any concerning symptoms in the interval. The patient voices understanding of current disease status and treatment options and is in agreement with the current care plan.  All questions were answered. The patient knows to call the clinic with any problems, questions or concerns. We can certainly see the patient much sooner if necessary.  I spent 10 minutes counseling the patient face to face. The total time spent in the appointment was 15 minutes.  Disclaimer: This note was dictated with voice recognition software. Similar sounding words can inadvertently be transcribed and may not be corrected upon review.

## 2017-01-29 NOTE — Telephone Encounter (Signed)
Appointments scheduled per 2/6 LOS. Patient given AVS report and calendars with future scheduled appointments. °

## 2017-02-18 ENCOUNTER — Encounter (HOSPITAL_BASED_OUTPATIENT_CLINIC_OR_DEPARTMENT_OTHER): Payer: Self-pay | Admitting: *Deleted

## 2017-02-18 ENCOUNTER — Emergency Department (HOSPITAL_BASED_OUTPATIENT_CLINIC_OR_DEPARTMENT_OTHER)
Admission: EM | Admit: 2017-02-18 | Discharge: 2017-02-18 | Disposition: A | Discharge status: Home / Self Care | Payer: Medicare Other | Attending: Emergency Medicine | Admitting: Emergency Medicine

## 2017-02-18 ENCOUNTER — Emergency Department (HOSPITAL_BASED_OUTPATIENT_CLINIC_OR_DEPARTMENT_OTHER): Payer: Medicare Other

## 2017-02-18 DIAGNOSIS — Y999 Unspecified external cause status: Secondary | ICD-10-CM | POA: Diagnosis not present

## 2017-02-18 DIAGNOSIS — S93602A Unspecified sprain of left foot, initial encounter: Secondary | ICD-10-CM

## 2017-02-18 DIAGNOSIS — Z79899 Other long term (current) drug therapy: Secondary | ICD-10-CM | POA: Diagnosis not present

## 2017-02-18 DIAGNOSIS — Z7982 Long term (current) use of aspirin: Secondary | ICD-10-CM | POA: Diagnosis not present

## 2017-02-18 DIAGNOSIS — S99922A Unspecified injury of left foot, initial encounter: Secondary | ICD-10-CM | POA: Diagnosis present

## 2017-02-18 DIAGNOSIS — Y929 Unspecified place or not applicable: Secondary | ICD-10-CM | POA: Diagnosis not present

## 2017-02-18 DIAGNOSIS — Z87891 Personal history of nicotine dependence: Secondary | ICD-10-CM | POA: Diagnosis not present

## 2017-02-18 DIAGNOSIS — S8252XA Displaced fracture of medial malleolus of left tibia, initial encounter for closed fracture: Secondary | ICD-10-CM | POA: Insufficient documentation

## 2017-02-18 DIAGNOSIS — Y9301 Activity, walking, marching and hiking: Secondary | ICD-10-CM | POA: Diagnosis not present

## 2017-02-18 DIAGNOSIS — W231XXA Caught, crushed, jammed, or pinched between stationary objects, initial encounter: Secondary | ICD-10-CM | POA: Diagnosis not present

## 2017-02-18 NOTE — ED Provider Notes (Signed)
[facility] DEPT [facility] Provider Note   CSN: [id number] Arrival date & time: 02/18/17  0827     History   Chief Complaint Chief Complaint  Patient presents with  . Foot Pain    HPI RAELIE LOHR is a 82 y.o. female.  HPI  82 year old female presents with left proximal, medial foot pain and swelling. Yesterday she was walking on an uneven surface and remembers her left foot/shoe getting caught. She remembers not twisting her foot or ankle. She did not feel a pop or immediate pain. However later yesterday evening she started feeling pain and having swelling in the extremity. She can walk on her foot that she has to walk on the lateral aspect and she gets nauseated when she does. There is no pain at rest but a 9/10 pain with ambulation. No weakness or numbness. No leg or knee pain.  Past Medical History:  Diagnosis Date  . Arthritis    HANDS,  WRISTS  . COPD (chronic obstructive pulmonary disease) ([place])   . Dyspnea on exertion   . Encounter for therapeutic drug monitoring 10/01/2016  . GERD (gastroesophageal reflux disease)   . Hemorrhoid   . History of anal fissures   . History of lung cancer ONCOLOGIST--  DR [facility]--  LAST CT ,  NO RECURRENCE OR METS   DX JAN 2010 --  STAGE IIIA  NON-SMALL CELL ADENOCARCINOMA (RIGHT MIDDLE LOBE)---  S/P  CHEMORADIATIO THERAPY  (COMPLETE 03-21-2009)  . lung ca dx'd 2010  . Normal cardiac stress test    2007  PER PT  . Osteoporosis   . Rash, skin    RIGHT FOREARM/ HAND  . Right wrist pain    MASS  . Wears glasses     Patient Active Problem List   Diagnosis Date Noted  . Encounter for therapeutic drug monitoring 10/01/2016  . Dyspnea 07/05/2016  . Pleural effusion, right 07/13/2014  . Right arm cellulitis 02/03/2014  . Cancer of right lung parenchyma ([place]) 11/12/2011    Past Surgical History:  Procedure Laterality Date  . ANAL FISSURE REPAIR  07/09/2011   INTERNAL SPHINCTEROTOMY  . BENIGN EXCISION LEFT BREAST CENTRAL DUCT  02/1999   . CHEST TUBE INSERTION Right 07/14/2014   Procedure: INSERTION PLEURAL DRAINAGE CATHETER RIGHT CHEST;  Surgeon: Grace Isaac, MD;  Location: [facility];  Service: Thoracic;  Laterality: Right;  . EXCISION RIGHT WRIST MASS  2012  . KNEE ARTHROSCOPY Right 1999  . MASS EXCISION Right 03/11/2014   Procedure: RIGHT WRIST DEEP MASS EXCISION WITH CULTURE AND BIOSPY;  Surgeon: Linna Hoff, MD;  Location: [facility];  Service: Orthopedics;  Laterality: Right;  . REMOVAL OF PLEURAL DRAINAGE CATHETER Right 10/14/2014   Procedure: REMOVAL OF PLEURAL DRAINAGE CATHETER;  Surgeon: Grace Isaac, MD;  Location: [facility];  Service: Thoracic;  Laterality: Right;  . TALC PLEURODESIS Right 09/16/2014   Procedure: TALC PLEURADESIS/ slurry;  Surgeon: Grace Isaac, MD;  Location: [facility];  Service: Thoracic;  Laterality: Right;  . THORACOSCOPY  01-26-2009   w/   lung/  node biopsy's  . THUMB ARTHROSCOPY Left    - removed bone spur  . TONSILLECTOMY  AS CHILD  . TOTAL ABDOMINAL HYSTERECTOMY W/ BILATERAL SALPINGOOPHORECTOMY  1982   W/  APPENDECTOMY  . TRANSTHORACIC ECHOCARDIOGRAM  12-23-2008   NORMAL LV/  EF 65-70%/  MILD MR  &  TR  . VAULT SUSPENSION PLUS CYSTOCELE REPAIR WITH GRAFT  06-13-2010    OB History  No data available       Home Medications    Prior to Admission medications   Medication Sig Start Date End Date Taking? Authorizing Provider  aspirin EC 81 MG tablet Take 81 mg by mouth daily.    Historical Provider, MD  bismuth subsalicylate (PEPTO BISMOL) 262 MG/15ML suspension Take 30 mLs by mouth every three (3) days as needed.    Historical Provider, MD  Calcium Carbonate-Vitamin D 600-400 MG-UNIT per chew tablet Chew 1 tablet by mouth 2 (two) times daily.    Historical Provider, MD  cholecalciferol (VITAMIN D) 1000 UNITS tablet Take 1,000 Units by mouth daily.     Historical Provider, MD  clindamycin (CLEOCIN-T) 1 % external solution Apply topically 2 (two) times daily.  10/01/16   Curt Bears, MD  Coenzyme Q10 (COQ10) 100 MG CAPS Take 1 capsule by mouth daily.     Historical Provider, MD  Cyanocobalamin (B-12) 1000 MCG CAPS Take 1,000 mcg by mouth 3 (three) times daily with meals.     Historical Provider, MD  erlotinib (TARCEVA) 100 MG tablet Take 1 tablet (100 mg total) by mouth daily. Take on an empty stomach 1 hour before meals or 2 hours after 01/02/17   Curt Bears, MD  Hydrocodone-Acetaminophen (VICODIN) 5-300 MG TABS Take 1 tablet by mouth 4 (four) times daily as needed (for pain).     Historical Provider, MD  hydroxychloroquine (PLAQUENIL) 200 MG tablet Take 400 mg by mouth every morning.     Historical Provider, MD  loperamide (IMODIUM A-D) 2 MG tablet Take 1 mg by mouth 4 (four) times daily as needed for diarrhea or loose stools.     Historical Provider, MD  loratadine (CLARITIN) 10 MG tablet Take 10 mg by mouth daily as needed for allergies.     Historical Provider, MD  LORazepam (ATIVAN) 0.5 MG tablet Take 1 tablet (0.5 mg total) by mouth every 8 (eight) hours as needed for anxiety. 10/14/14   Curt Bears, MD  oxyCODONE (OXY IR/ROXICODONE) 5 MG immediate release tablet Take 1 tablet (5 mg total) by mouth every 4 (four) hours as needed for moderate pain. Patient not taking: Reported on 01/29/2017 07/15/14   Nita Sells, MD  Polyethyl Glycol-Propyl Glycol (SYSTANE OP) Place 1 drop into both eyes at bedtime as needed (for dry eyes.).     Historical Provider, MD  psyllium (METAMUCIL) 58.6 % powder Take 1 packet by mouth daily as needed.    Historical Provider, MD  Simethicone (GAS-X PO) Take 1 tablet by mouth 2 (two) times daily as needed (for gas).     Historical Provider, MD    Family History Family History  Problem Relation Age of Onset  . Cancer Father     bladder  . Cancer Son     kidney - removed as a teenager    Social History Social History  Substance Use Topics  . Smoking status: Former Smoker    Packs/day: 1.00     Years: 30.00    Types: Cigarettes    Quit date: 12/24/1977  . Smokeless tobacco: Never Used  . Alcohol use No     Allergies   Amoxicillin   Review of Systems Review of Systems  Musculoskeletal: Positive for arthralgias and joint swelling.  Neurological: Negative for weakness and numbness.  All other systems reviewed and are negative.    Physical Exam Updated Vital Signs BP 145/84 (BP Location: Right Arm)   Pulse 95   Temp 98 F (36.7 C) (Oral)  Resp 18   Ht '5\' 4"'$  (1.626 m)   Wt 113 lb (51.3 kg)   SpO2 97%   BMI 19.40 kg/m   Physical Exam  Constitutional: She is oriented to person, place, and time. She appears well-developed and well-nourished.  HENT:  Head: Normocephalic and atraumatic.  Right Ear: External ear normal.  Left Ear: External ear normal.  Nose: Nose normal.  Eyes: Right eye exhibits no discharge. Left eye exhibits no discharge.  Cardiovascular: Normal rate and regular rhythm.   Pulses:      Dorsalis pedis pulses are 2+ on the left side.  Pulmonary/Chest: Effort normal.  Musculoskeletal:       Left knee: No tenderness found.       Left ankle: She exhibits normal range of motion (painful to proximal foot) and no ecchymosis. No tenderness. Achilles tendon exhibits no pain and no defect.       Left lower leg: She exhibits no tenderness.       Left foot: There is decreased range of motion, tenderness, bony tenderness and swelling.       Feet:  Left foot warm, well perfused. Normal strength/movement  Neurological: She is alert and oriented to person, place, and time.  Skin: Skin is warm and dry.  Nursing note and vitals reviewed.    ED Treatments / Results  Labs (all labs ordered are listed, but only abnormal results are displayed) Labs Reviewed - No data to display  EKG  EKG Interpretation None       Radiology Dg Ankle Complete Left  Result Date: 02/18/2017 CLINICAL DATA:  Twisted foot and ankle yesterday. Medial foot and ankle pain.  EXAM: LEFT ANKLE COMPLETE - 3+ VIEW COMPARISON:  None. FINDINGS: Irregularity noted at the tip of the medial malleolus concerning for small avulsion fracture. No additional acute bony abnormality. No subluxation or dislocation. Plantar and posterior calcaneal spurs. IMPRESSION: Probable small avulsion fracture off the tip of the medial malleolus. Electronically Signed   By: Rolm Baptise M.D.   On: 02/18/2017 09:01   Dg Foot Complete Left  Result Date: 02/18/2017 CLINICAL DATA:  Twisting injury yesterday. Medial foot and ankle pain. EXAM: LEFT FOOT - COMPLETE 3+ VIEW COMPARISON:  Ankle series performed today. FINDINGS: No acute bony abnormality. Specifically, no fracture, subluxation, or dislocation. Soft tissues are intact. Plantar and posterior calcaneal spurs noted. IMPRESSION: No acute bony abnormality. Electronically Signed   By: Rolm Baptise M.D.   On: 02/18/2017 09:02    Procedures Procedures (including critical care time)  Medications Ordered in ED Medications - No data to display   Initial Impression / Assessment and Plan / ED Course  I have reviewed the triage vital signs and the nursing notes.  Pertinent labs & imaging results that were available during my care of the patient were reviewed by me and considered in my medical decision making (see chart for details).     X-ray with possible small avulsion fracture of the medial malleolus. Most likely this represents a significant sprain. Given this and her significant pain with ambulation, offered Cam Walker. When she saw what this was she thinks she has one at home and wants to try and find that first. Encouraged to go to a medical supply store can't find it or to come back here. Neurovascular intact.  Final Clinical Impressions(s) / ED Diagnoses   Final diagnoses:  Sprain of left foot, initial encounter  Closed avulsion fracture of medial malleolus of left tibia, initial encounter  New Prescriptions Discharge Medication List  as of 02/18/2017  9:27 AM       Sherwood Gambler, MD 02/18/17 [phone]

## 2017-02-18 NOTE — ED Notes (Signed)
Pt has Cam Walker at home.

## 2017-02-18 NOTE — ED Notes (Signed)
Pt verbalized understanding of discharge instructions and denies any further questions at this time.   

## 2017-02-18 NOTE — ED Triage Notes (Signed)
Pt reports a "mis step" while walking yesterday, and has had pain to her left arch and lower posterior ankle area since then. Swelling noted, able to wb with pain.

## 2017-02-18 NOTE — ED Notes (Signed)
Patient transported to X-ray 

## 2017-02-25 ENCOUNTER — Encounter (HOSPITAL_COMMUNITY): Payer: Self-pay

## 2017-02-25 ENCOUNTER — Other Ambulatory Visit (HOSPITAL_BASED_OUTPATIENT_CLINIC_OR_DEPARTMENT_OTHER): Payer: Medicare Other

## 2017-02-25 ENCOUNTER — Ambulatory Visit (HOSPITAL_COMMUNITY)
Admission: RE | Admit: 2017-02-25 | Discharge: 2017-02-25 | Disposition: A | Discharge status: Home / Self Care | Payer: Medicare Other | Source: Ambulatory Visit | Attending: Internal Medicine | Admitting: Internal Medicine

## 2017-02-25 ENCOUNTER — Other Ambulatory Visit: Payer: Self-pay | Admitting: Medical Oncology

## 2017-02-25 DIAGNOSIS — Z5181 Encounter for therapeutic drug level monitoring: Secondary | ICD-10-CM | POA: Diagnosis not present

## 2017-02-25 DIAGNOSIS — R918 Other nonspecific abnormal finding of lung field: Secondary | ICD-10-CM | POA: Diagnosis not present

## 2017-02-25 DIAGNOSIS — C3491 Malignant neoplasm of unspecified part of right bronchus or lung: Secondary | ICD-10-CM

## 2017-02-25 DIAGNOSIS — I700 Atherosclerosis of aorta: Secondary | ICD-10-CM | POA: Insufficient documentation

## 2017-02-25 LAB — CBC WITH DIFFERENTIAL/PLATELET
BASO%: 0.7 % (ref 0.0–2.0)
Basophils Absolute: 0 10*3/uL (ref 0.0–0.1)
EOS%: 7.7 % — AB (ref 0.0–7.0)
Eosinophils Absolute: 0.4 10*3/uL (ref 0.0–0.5)
HCT: 37.9 % (ref 34.8–46.6)
HGB: 12.7 g/dL (ref 11.6–15.9)
LYMPH%: 10.7 % — AB (ref 14.0–49.7)
MCH: 31.5 pg (ref 25.1–34.0)
MCHC: 33.4 g/dL (ref 31.5–36.0)
MCV: 94.1 fL (ref 79.5–101.0)
MONO#: 0.6 10*3/uL (ref 0.1–0.9)
MONO%: 11.3 % (ref 0.0–14.0)
NEUT%: 69.6 % (ref 38.4–76.8)
NEUTROS ABS: 3.5 10*3/uL (ref 1.5–6.5)
Platelets: 274 10*3/uL (ref 145–400)
RBC: 4.02 10*6/uL (ref 3.70–5.45)
RDW: 13.7 % (ref 11.2–14.5)
WBC: 5 10*3/uL (ref 3.9–10.3)
lymph#: 0.5 10*3/uL — ABNORMAL LOW (ref 0.9–3.3)

## 2017-02-25 LAB — COMPREHENSIVE METABOLIC PANEL
ALT: 19 U/L (ref 0–55)
AST: 23 U/L (ref 5–34)
Albumin: 3.3 g/dL — ABNORMAL LOW (ref 3.5–5.0)
Alkaline Phosphatase: 61 U/L (ref 40–150)
Anion Gap: 10 mEq/L (ref 3–11)
BILIRUBIN TOTAL: 0.88 mg/dL (ref 0.20–1.20)
BUN: 22.2 mg/dL (ref 7.0–26.0)
CO2: 26 meq/L (ref 22–29)
CREATININE: 0.8 mg/dL (ref 0.6–1.1)
Calcium: 9.6 mg/dL (ref 8.4–10.4)
Chloride: 107 mEq/L (ref 98–109)
EGFR: 74 mL/min/{1.73_m2} — AB (ref >90)
GLUCOSE: 86 mg/dL (ref 70–140)
Potassium: 4.2 mEq/L (ref 3.5–5.1)
SODIUM: 143 meq/L (ref 136–145)
TOTAL PROTEIN: 6.1 g/dL — AB (ref 6.4–8.3)

## 2017-02-25 MED ORDER — ERLOTINIB HCL 100 MG PO TABS: 100.0000 mg | ORAL_TABLET | Freq: Every day | ORAL | 0 refills | Status: DC

## 2017-02-25 MED ORDER — IOPAMIDOL (ISOVUE-300) INJECTION 61%
INTRAVENOUS | Status: AC
  Filled 2017-02-25: qty 75

## 2017-02-25 MED ORDER — IOPAMIDOL (ISOVUE-300) INJECTION 61%
75.0000 mL | Freq: Once | INTRAVENOUS | Status: AC | PRN
  Administered 2017-02-25: 75 mL via INTRAVENOUS

## 2017-02-26 ENCOUNTER — Encounter: Payer: Self-pay | Admitting: Family Medicine

## 2017-02-26 ENCOUNTER — Ambulatory Visit (INDEPENDENT_AMBULATORY_CARE_PROVIDER_SITE_OTHER): Payer: Medicare Other | Admitting: Family Medicine

## 2017-02-26 DIAGNOSIS — S99912A Unspecified injury of left ankle, initial encounter: Secondary | ICD-10-CM | POA: Diagnosis not present

## 2017-02-26 NOTE — Patient Instructions (Signed)
You have a medial ankle sprain. Ice the area for 15 minutes at a time, 3-4 times a day Minimize use of ibuprofen or aleve because these can increase blood pressure, stomach irritation, risk of heart attack. Elevate above the level of your heart when possible Bear weight when tolerated Use laceup ankle brace during the day to help with stability while you recover from this injury. Come out of the brace twice a day to do up/down and alphabet exercises 2-3 sets of each. Start theraband strengthening exercises in a week - once a day 3 sets of 10. Consider physical therapy for strengthening and balance exercises in the future. Follow up with me in 2 weeks. Activities as tolerated - I think the water aerobics are a great idea to continue at this time.

## 2017-02-28 ENCOUNTER — Telehealth: Payer: Self-pay | Admitting: Internal Medicine

## 2017-02-28 ENCOUNTER — Encounter: Payer: Self-pay | Admitting: Internal Medicine

## 2017-02-28 ENCOUNTER — Ambulatory Visit (HOSPITAL_BASED_OUTPATIENT_CLINIC_OR_DEPARTMENT_OTHER): Payer: Medicare Other | Admitting: Internal Medicine

## 2017-02-28 VITALS — BP 145/78 | HR 101 | Temp 98.0°F | Resp 16 | Wt 118.2 lb

## 2017-02-28 DIAGNOSIS — Z5181 Encounter for therapeutic drug level monitoring: Secondary | ICD-10-CM

## 2017-02-28 DIAGNOSIS — I10 Essential (primary) hypertension: Secondary | ICD-10-CM | POA: Diagnosis not present

## 2017-02-28 DIAGNOSIS — S99912A Unspecified injury of left ankle, initial encounter: Secondary | ICD-10-CM | POA: Insufficient documentation

## 2017-02-28 DIAGNOSIS — R911 Solitary pulmonary nodule: Secondary | ICD-10-CM | POA: Diagnosis not present

## 2017-02-28 DIAGNOSIS — C3491 Malignant neoplasm of unspecified part of right bronchus or lung: Secondary | ICD-10-CM | POA: Diagnosis not present

## 2017-02-28 NOTE — Progress Notes (Signed)
[facility] [phone]   Fax:([phone]) [place], [place] [address]  DIAGNOSIS: Stage IIIB (T2a, N3, M0) non-small cell lung cancer, adenocarcinoma with positive EGFR mutation diagnosed in January 2010.  PRIOR THERAPY: 1) Status post concurrent chemoradiation with weekly carboplatin and paclitaxel; last dose given March 21, 2009.  2) Tarceva at 150 mg p.o. daily, status post approximately 48 months of treatment, discontinued secondary to persistent diarrhea.  3) status post right Pleurx catheter placement for right nonmalignant pleural effusion.  CURRENT THERAPY: Tarceva 100 mg by mouth daily started 03/19/2013, status post 42 months of treatment.  INTERVAL HISTORY: Dana [name] 82 y.o. female came to the clinic today for follow-up visit accompanied by her husband. The patient is feeling fine today with no specific complaints except for recent fall after tripping. She had a small avulsion fracture of the tip of the medial malleolus. She denied having any chest pain, shortness of breath, cough or hemoptysis. She has been on treatment with Tarceva 100 mg by mouth every other day for the last few weeks because of toxicity. She has no nausea or vomiting, no fever or chills. She is here today for evaluation with repeat CT scan of the chest.  MEDICAL HISTORY: Past Medical History:  Diagnosis Date  . Arthritis    HANDS,  WRISTS  . COPD (chronic obstructive pulmonary disease) ([place])   . Dyspnea on exertion   . Encounter for therapeutic drug monitoring 10/01/2016  . GERD (gastroesophageal reflux disease)   . Hemorrhoid   . History of anal fissures   . History of lung cancer ONCOLOGIST--  DR [facility]--  LAST CT ,  NO RECURRENCE OR METS   DX JAN 2010 --  STAGE IIIA  NON-SMALL CELL ADENOCARCINOMA (RIGHT MIDDLE LOBE)---  S/P  CHEMORADIATIO THERAPY  (COMPLETE 03-21-2009)  . lung ca dx'd 2010  . Normal  cardiac stress test    2007  PER PT  . Osteoporosis   . Rash, skin    RIGHT FOREARM/ HAND  . Right wrist pain    MASS  . Wears glasses     ALLERGIES:  is allergic to amoxicillin.  MEDICATIONS:  Current Outpatient Prescriptions  Medication Sig Dispense Refill  . aspirin EC 81 MG tablet Take 81 mg by mouth daily.    Marland Kitchen bismuth subsalicylate (PEPTO BISMOL) 262 MG/15ML suspension Take 30 mLs by mouth every three (3) days as needed.    . Calcium Carbonate-Vitamin D 600-400 MG-UNIT per chew tablet Chew 1 tablet by mouth 2 (two) times daily.    . cholecalciferol (VITAMIN D) 1000 UNITS tablet Take 1,000 Units by mouth daily.     . clindamycin (CLEOCIN-T) 1 % external solution Apply topically 2 (two) times daily. 30 mL 0  . Coenzyme Q10 (COQ10) 100 MG CAPS Take 1 capsule by mouth daily.     . Cyanocobalamin (B-12) 1000 MCG CAPS Take 1,000 mcg by mouth 3 (three) times daily with meals.     . erlotinib (TARCEVA) 100 MG tablet Take 1 tablet (100 mg total) by mouth daily. Take on an empty stomach 1 hour before meals or 2 hours after 30 tablet 0  . loratadine (CLARITIN) 10 MG tablet Take 10 mg by mouth daily as needed for allergies.     Marland Kitchen LORazepam (ATIVAN) 0.5 MG tablet Take 1 tablet (0.5 mg total) by mouth every 8 (eight) hours as needed for anxiety. [place]  tablet 0  . Polyethyl Glycol-Propyl Glycol (SYSTANE OP) Place 1 drop into both eyes at bedtime as needed (for dry eyes.).     Marland Kitchen Simethicone (GAS-X PO) Take 1 tablet by mouth 2 (two) times daily as needed (for gas).     . Hydrocodone-Acetaminophen (VICODIN) 5-300 MG TABS Take 1 tablet by mouth 4 (four) times daily as needed (for pain).     Marland Kitchen loperamide (IMODIUM A-D) 2 MG tablet Take 1 mg by mouth 4 (four) times daily as needed for diarrhea or loose stools.     Marland Kitchen oxyCODONE (OXY IR/ROXICODONE) 5 MG immediate release tablet Take 1 tablet (5 mg total) by mouth every 4 (four) hours as needed for moderate pain. (Patient not taking: Reported on 01/29/2017) 30  tablet 0  . psyllium (METAMUCIL) 58.6 % powder Take 1 packet by mouth daily as needed.     No current facility-administered medications for this visit.     SURGICAL HISTORY:  Past Surgical History:  Procedure Laterality Date  . ANAL FISSURE REPAIR  07/09/2011   INTERNAL SPHINCTEROTOMY  . BENIGN EXCISION LEFT BREAST CENTRAL DUCT  02/1999  . CHEST TUBE INSERTION Right 07/14/2014   Procedure: INSERTION PLEURAL DRAINAGE CATHETER RIGHT CHEST;  Surgeon: Grace Isaac, MD;  Location: [facility];  Service: Thoracic;  Laterality: Right;  . EXCISION RIGHT WRIST MASS  2012  . KNEE ARTHROSCOPY Right 1999  . MASS EXCISION Right 03/11/2014   Procedure: RIGHT WRIST DEEP MASS EXCISION WITH CULTURE AND BIOSPY;  Surgeon: Linna Hoff, MD;  Location: [name];  Service: Orthopedics;  Laterality: Right;  . REMOVAL OF PLEURAL DRAINAGE CATHETER Right 10/14/2014   Procedure: REMOVAL OF PLEURAL DRAINAGE CATHETER;  Surgeon: Grace Isaac, MD;  Location: [facility];  Service: Thoracic;  Laterality: Right;  . TALC PLEURODESIS Right 09/16/2014   Procedure: TALC PLEURADESIS/ slurry;  Surgeon: Grace Isaac, MD;  Location: [facility];  Service: Thoracic;  Laterality: Right;  . THORACOSCOPY  01-26-2009   w/   lung/  node biopsy's  . THUMB ARTHROSCOPY Left    - removed bone spur  . TONSILLECTOMY  AS CHILD  . TOTAL ABDOMINAL HYSTERECTOMY W/ BILATERAL SALPINGOOPHORECTOMY  1982   W/  APPENDECTOMY  . TRANSTHORACIC ECHOCARDIOGRAM  12-23-2008   NORMAL LV/  EF 65-70%/  MILD MR  &  TR  . VAULT SUSPENSION PLUS CYSTOCELE REPAIR WITH GRAFT  06-13-2010    REVIEW OF SYSTEMS:  Constitutional: positive for fatigue Eyes: negative Ears, nose, mouth, throat, and face: negative Respiratory: negative Cardiovascular: negative Gastrointestinal: negative Genitourinary:negative Integument/breast: negative Hematologic/lymphatic: negative Musculoskeletal:positive for bone pain Neurological: negative Behavioral/Psych:  negative Endocrine: negative Allergic/Immunologic: negative   PHYSICAL EXAMINATION: General appearance: alert, cooperative, fatigued and no distress Head: Normocephalic, without obvious abnormality, atraumatic Neck: no adenopathy, no JVD, supple, symmetrical, trachea midline and thyroid not enlarged, symmetric, no tenderness/mass/nodules Lymph nodes: Cervical, supraclavicular, and axillary nodes normal. Resp: clear to auscultation bilaterally Back: symmetric, no curvature. ROM normal. No CVA tenderness. Cardio: regular rate and rhythm, S1, S2 normal, no murmur, click, rub or gallop GI: soft, non-tender; bowel sounds normal; no masses,  no organomegaly Extremities: extremities normal, atraumatic, no cyanosis or edema Neurologic: Alert and oriented X 3, normal strength and tone. Normal symmetric reflexes. Normal coordination and gait  ECOG PERFORMANCE STATUS: 1 - Symptomatic but completely ambulatory  Blood pressure (!) 145/78, pulse (!) 101, temperature 98 F (36.7 C), temperature source Oral, resp. rate 16, weight 118 lb 3.2 oz (53.6  kg), SpO2 95 %.  LABORATORY DATA: Lab Results  Component Value Date   WBC 5.0 02/25/2017   HGB 12.7 02/25/2017   HCT 37.9 02/25/2017   MCV 94.1 02/25/2017   PLT 274 02/25/2017      Chemistry      Component Value Date/Time   NA 143 02/25/2017 0810   K 4.2 02/25/2017 0810   CL 98 07/05/2016 1012   CL 108 (H) 06/03/2013 0939   CO2 26 02/25/2017 0810   BUN 22.2 02/25/2017 0810   CREATININE 0.8 02/25/2017 0810      Component Value Date/Time   CALCIUM 9.6 02/25/2017 0810   ALKPHOS 61 02/25/2017 0810   AST 23 02/25/2017 0810   ALT 19 02/25/2017 0810   BILITOT 0.88 02/25/2017 0810       RADIOGRAPHIC STUDIES: Dg Ankle Complete Left  Result Date: 02/18/2017 CLINICAL DATA:  Twisted foot and ankle yesterday. Medial foot and ankle pain. EXAM: LEFT ANKLE COMPLETE - 3+ VIEW COMPARISON:  None. FINDINGS: Irregularity noted at the tip of the medial  malleolus concerning for small avulsion fracture. No additional acute bony abnormality. No subluxation or dislocation. Plantar and posterior calcaneal spurs. IMPRESSION: Probable small avulsion fracture off the tip of the medial malleolus. Electronically Signed   By: Rolm Baptise M.D.   On: 02/18/2017 09:01   Ct Chest W Contrast  Result Date: 02/25/2017 CLINICAL DATA:  Right lung cancer with chemotherapy and radiation therapy. Ongoing Tarceva therapy. EXAM: CT CHEST WITH CONTRAST TECHNIQUE: Multidetector CT imaging of the chest was performed during intravenous contrast administration. CONTRAST:  52m ISOVUE-300 IOPAMIDOL (ISOVUE-300) INJECTION 61% COMPARISON:  11/23/2016 and 01/10/2015. FINDINGS: Cardiovascular: Atherosclerotic calcification of the arterial vasculature, including coronary arteries. Heart size normal. No pericardial effusion. Mediastinum/Nodes: Mediastinal lymph nodes measure up to 8 mm in the low right paratracheal station, stable. Bi hilar lymphoid tissue. No subcarinal or axillary adenopathy. Esophagus is grossly unremarkable. Lungs/Pleura: Juxta mediastinal/ perihilar volume loss, bronchiectasis and architectural distortion in the right hemithorax, stable. Evidence of talc pleurodesis at the base of the right hemithorax. Mixed solid and ground-glass nodule in the apical left upper lobe measures 1.4 x 1.4 cm with an internal solid component measuring 4 mm (series 5, image 21). When compared with 01/10/2015, lesion has a more nodular appearance and the solid component is new. Subpleural ground-glass nodule in the anterior left upper lobe measures 8 mm (series 5, image 58), previously 6 mm on 01/10/2015. No pleural fluid. Airway is otherwise unremarkable. Upper Abdomen: Visualized portions of the liver, adrenal glands and right kidney are unremarkable. There may be small renal sinus cysts on the left. Visualized portions of the spleen, pancreas, stomach and bowel are grossly unremarkable. No  upper abdominal adenopathy. Musculoskeletal: No worrisome lytic or sclerotic lesions. Degenerative changes are seen in the spine. IMPRESSION: 1. Sub solid apical left upper lobe nodule, increased in size and with a new internal solid component when compared with 01/10/2015. Enlarging ground-glass left upper lobe nodule. Findings are worrisome for indolent adenocarcinoma. 2. Posttreatment changes in the right hemithorax, stable. 3. Aortic atherosclerosis (ICD10-170.0). Coronary artery calcification. Electronically Signed   By: MLorin PicketM.D.   On: 02/25/2017 10:19   Dg Foot Complete Left  Result Date: 02/18/2017 CLINICAL DATA:  Twisting injury yesterday. Medial foot and ankle pain. EXAM: LEFT FOOT - COMPLETE 3+ VIEW COMPARISON:  Ankle series performed today. FINDINGS: No acute bony abnormality. Specifically, no fracture, subluxation, or dislocation. Soft tissues are intact. Plantar and posterior calcaneal spurs  noted. IMPRESSION: No acute bony abnormality. Electronically Signed   By: Rolm Baptise M.D.   On: 02/18/2017 09:02    ASSESSMENT AND PLAN:  This is a very pleasant 82 years old white female with stage IIIB non-small cell lung cancer with positive EGFR mutation diagnosed in January 2010. The patient was treated with a course of concurrent chemoradiation and she has been on treatment with Tarceva 150 mg by mouth daily for 48 months followed by reduced dose Tarceva 100 mg by mouth daily for 43 months. The last few weeks she has been taking her Tarceva every other day because of the toxicity and skin rash. She had repeat CT scan of the chest performed recently. I personally and independently reviewed the scan images and discuss the results with the patient and her husband. Her scan showed no clear evidence for disease progression but there was increasing groundglass pulmonary nodule that need close observation on upcoming imaging. I recommended for the patient to continue her treatment with  Tarceva but she will resume her daily doses of 100 mg. For hypertension, strongly recommended for the patient to take her blood pressure medication as prescribed by her primary care physician. I will see her back for follow-up visit in one month's for reevaluation with repeat blood work. She was advised to call immediately if she has any concerning symptoms in the interval. The patient voices understanding of current disease status and treatment options and is in agreement with the current care plan.  All questions were answered. The patient knows to call the clinic with any problems, questions or concerns. We can certainly see the patient much sooner if necessary.  I spent 15 minutes counseling the patient face to face. The total time spent in the appointment was 25 minutes.  Disclaimer: This note was dictated with voice recognition software. Similar sounding words can inadvertently be transcribed and may not be corrected upon review.

## 2017-02-28 NOTE — Assessment & Plan Note (Signed)
independently reviewed radiographs and no evidence fracture.  Consistent with medial ankle sprain.  Icing, elevation, tylenol if needed.  Shown home motion exercises to do daily and can start strengthening in a week - also demonstrated.  F/u in 2 weeks for reevaluation.

## 2017-02-28 NOTE — Telephone Encounter (Signed)
Appointments scheduled per 02/28/17 los. Patient was given a copy of the AVS report and appointment schedule per 02/28/17 los.

## 2017-02-28 NOTE — Progress Notes (Signed)
PCP: [name],[name], MD  Subjective:   HPI: Patient is a 82 y.o. female here for left foot injury.  Patient reports on 2/25 she was in her garden walking sideways - left foot hit side of garden and she inverted this causing pain medial left ankle. Pain worse by the next morning, couldn't bear weight on this. Went to emergency department - had x-rays. Has been icing, using a walker. Pain level is 1/10 at rest, worse with ambulation and sharp medially. No prior injuries. No skin changes, numbness.  Past Medical History:  Diagnosis Date  . Arthritis    HANDS,  WRISTS  . COPD (chronic obstructive pulmonary disease) ([place])   . Dyspnea on exertion   . Encounter for therapeutic drug monitoring 10/01/2016  . GERD (gastroesophageal reflux disease)   . Hemorrhoid   . History of anal fissures   . History of lung cancer ONCOLOGIST--  DR [facility]--  LAST CT ,  NO RECURRENCE OR METS   DX JAN 2010 --  STAGE IIIA  NON-SMALL CELL ADENOCARCINOMA (RIGHT MIDDLE LOBE)---  S/P  CHEMORADIATIO THERAPY  (COMPLETE 03-21-2009)  . lung ca dx'd 2010  . Normal cardiac stress test    2007  PER PT  . Osteoporosis   . Rash, skin    RIGHT FOREARM/ HAND  . Right wrist pain    MASS  . Wears glasses     Current Outpatient Prescriptions on File Prior to Visit  Medication Sig Dispense Refill  . aspirin EC 81 MG tablet Take 81 mg by mouth daily.    Marland Kitchen bismuth subsalicylate (PEPTO BISMOL) 262 MG/15ML suspension Take 30 mLs by mouth every three (3) days as needed.    . Calcium Carbonate-Vitamin D 600-400 MG-UNIT per chew tablet Chew 1 tablet by mouth 2 (two) times daily.    . cholecalciferol (VITAMIN D) 1000 UNITS tablet Take 1,000 Units by mouth daily.     . clindamycin (CLEOCIN-T) 1 % external solution Apply topically 2 (two) times daily. 30 mL 0  . Coenzyme Q10 (COQ10) 100 MG CAPS Take 1 capsule by mouth daily.     . Cyanocobalamin (B-12) 1000 MCG CAPS Take 1,000 mcg by mouth 3 (three) times daily with meals.      . erlotinib (TARCEVA) 100 MG tablet Take 1 tablet (100 mg total) by mouth daily. Take on an empty stomach 1 hour before meals or 2 hours after 30 tablet 0  . Hydrocodone-Acetaminophen (VICODIN) 5-300 MG TABS Take 1 tablet by mouth 4 (four) times daily as needed (for pain).     Marland Kitchen loperamide (IMODIUM A-D) 2 MG tablet Take 1 mg by mouth 4 (four) times daily as needed for diarrhea or loose stools.     Marland Kitchen loratadine (CLARITIN) 10 MG tablet Take 10 mg by mouth daily as needed for allergies.     Marland Kitchen LORazepam (ATIVAN) 0.5 MG tablet Take 1 tablet (0.5 mg total) by mouth every 8 (eight) hours as needed for anxiety. 30 tablet 0  . oxyCODONE (OXY IR/ROXICODONE) 5 MG immediate release tablet Take 1 tablet (5 mg total) by mouth every 4 (four) hours as needed for moderate pain. (Patient not taking: Reported on 01/29/2017) 30 tablet 0  . Polyethyl Glycol-Propyl Glycol (SYSTANE OP) Place 1 drop into both eyes at bedtime as needed (for dry eyes.).     Marland Kitchen psyllium (METAMUCIL) 58.6 % powder Take 1 packet by mouth daily as needed.    . Simethicone (GAS-X PO) Take 1 tablet by mouth 2 (two)  times daily as needed (for gas).      No current facility-administered medications on file prior to visit.     Past Surgical History:  Procedure Laterality Date  . ANAL FISSURE REPAIR  07/09/2011   INTERNAL SPHINCTEROTOMY  . BENIGN EXCISION LEFT BREAST CENTRAL DUCT  02/1999  . CHEST TUBE INSERTION Right 07/14/2014   Procedure: INSERTION PLEURAL DRAINAGE CATHETER RIGHT CHEST;  Surgeon: Grace Isaac, MD;  Location: [facility];  Service: Thoracic;  Laterality: Right;  . EXCISION RIGHT WRIST MASS  2012  . KNEE ARTHROSCOPY Right 1999  . MASS EXCISION Right 03/11/2014   Procedure: RIGHT WRIST DEEP MASS EXCISION WITH CULTURE AND BIOSPY;  Surgeon: Linna Hoff, MD;  Location: [facility];  Service: Orthopedics;  Laterality: Right;  . REMOVAL OF PLEURAL DRAINAGE CATHETER Right 10/14/2014   Procedure: REMOVAL OF PLEURAL  DRAINAGE CATHETER;  Surgeon: Grace Isaac, MD;  Location: [facility];  Service: Thoracic;  Laterality: Right;  . TALC PLEURODESIS Right 09/16/2014   Procedure: TALC PLEURADESIS/ slurry;  Surgeon: Grace Isaac, MD;  Location: [facility];  Service: Thoracic;  Laterality: Right;  . THORACOSCOPY  01-26-2009   w/   lung/  node biopsy's  . THUMB ARTHROSCOPY Left    - removed bone spur  . TONSILLECTOMY  AS CHILD  . TOTAL ABDOMINAL HYSTERECTOMY W/ BILATERAL SALPINGOOPHORECTOMY  1982   W/  APPENDECTOMY  . TRANSTHORACIC ECHOCARDIOGRAM  12-23-2008   NORMAL LV/  EF 65-70%/  MILD MR  &  TR  . VAULT SUSPENSION PLUS CYSTOCELE REPAIR WITH GRAFT  06-13-2010    Allergies  Allergen Reactions  . Amoxicillin Diarrhea    Hx of C Diff     Social History   Social History  . Marital status: Married    Spouse name: N/A  . Number of children: N/A  . Years of education: N/A   Occupational History  . Not on file.   Social History Main Topics  . Smoking status: Former Smoker    Packs/day: 1.00    Years: 30.00    Types: Cigarettes    Quit date: 12/24/1977  . Smokeless tobacco: Never Used  . Alcohol use No  . Drug use: No  . Sexual activity: Not on file   Other Topics Concern  . Not on file   Social History Narrative  . No narrative on file    Family History  Problem Relation Age of Onset  . Cancer Father     bladder  . Cancer Son     kidney - removed as a teenager    BP 135/70   Pulse 91   Ht '5\' 5"'$  (1.651 m)   Wt 114 lb (51.7 kg)   BMI 18.97 kg/m   Review of Systems: See HPI above.     Objective:  Physical Exam:  Gen: NAD, comfortable in exam room  Left foot/ankle: Mild medial and anterior swelling.  No other gross deformity, ecchymoses Mild limitation ROM all motions. TTP medial malleolus and just distal to this.  No other tenderness about ankle or foot. Negative ant drawer and talar tilt.  1+ painful reverse talar tilt. Negative syndesmotic compression. Thompsons test  negative. NV intact distally.  Right foot/ankle: FROM without pain.   Assessment & Plan:  1. Left ankle injury - independently reviewed radiographs and no evidence fracture.  Consistent with medial ankle sprain.  Icing, elevation, tylenol if needed.  Shown home motion exercises to do daily and can start  strengthening in a week - also demonstrated.  F/u in 2 weeks for reevaluation.

## 2017-03-12 ENCOUNTER — Ambulatory Visit: Payer: Medicare Other | Admitting: Family Medicine

## 2017-04-03 ENCOUNTER — Other Ambulatory Visit: Payer: Self-pay | Admitting: Family Medicine

## 2017-04-03 DIAGNOSIS — Z1231 Encounter for screening mammogram for malignant neoplasm of breast: Secondary | ICD-10-CM

## 2017-04-03 DIAGNOSIS — M85859 Other specified disorders of bone density and structure, unspecified thigh: Secondary | ICD-10-CM

## 2017-04-04 ENCOUNTER — Encounter: Payer: Self-pay | Admitting: Internal Medicine

## 2017-04-04 ENCOUNTER — Other Ambulatory Visit (HOSPITAL_BASED_OUTPATIENT_CLINIC_OR_DEPARTMENT_OTHER): Payer: Medicare Other

## 2017-04-04 ENCOUNTER — Telehealth: Payer: Self-pay | Admitting: Internal Medicine

## 2017-04-04 ENCOUNTER — Ambulatory Visit (HOSPITAL_BASED_OUTPATIENT_CLINIC_OR_DEPARTMENT_OTHER): Payer: Medicare Other | Admitting: Internal Medicine

## 2017-04-04 VITALS — BP 138/66 | HR 90 | Temp 97.8°F | Resp 18 | Ht 65.0 in | Wt 115.7 lb

## 2017-04-04 DIAGNOSIS — C3491 Malignant neoplasm of unspecified part of right bronchus or lung: Secondary | ICD-10-CM | POA: Diagnosis not present

## 2017-04-04 DIAGNOSIS — R5383 Other fatigue: Secondary | ICD-10-CM | POA: Diagnosis not present

## 2017-04-04 DIAGNOSIS — R21 Rash and other nonspecific skin eruption: Secondary | ICD-10-CM | POA: Diagnosis not present

## 2017-04-04 DIAGNOSIS — F32A Depression, unspecified: Secondary | ICD-10-CM | POA: Insufficient documentation

## 2017-04-04 DIAGNOSIS — Z5181 Encounter for therapeutic drug level monitoring: Secondary | ICD-10-CM

## 2017-04-04 DIAGNOSIS — R197 Diarrhea, unspecified: Secondary | ICD-10-CM

## 2017-04-04 DIAGNOSIS — F329 Major depressive disorder, single episode, unspecified: Secondary | ICD-10-CM | POA: Diagnosis not present

## 2017-04-04 DIAGNOSIS — R531 Weakness: Secondary | ICD-10-CM

## 2017-04-04 HISTORY — DX: Depression, unspecified: F32.A

## 2017-04-04 LAB — CBC WITH DIFFERENTIAL/PLATELET
BASO%: 0.3 % (ref 0.0–2.0)
Basophils Absolute: 0 10*3/uL (ref 0.0–0.1)
EOS%: 5.3 % (ref 0.0–7.0)
Eosinophils Absolute: 0.3 10*3/uL (ref 0.0–0.5)
HCT: 37.1 % (ref 34.8–46.6)
HGB: 12.4 g/dL (ref 11.6–15.9)
LYMPH%: 10.5 % — ABNORMAL LOW (ref 14.0–49.7)
MCH: 30.9 pg (ref 25.1–34.0)
MCHC: 33.4 g/dL (ref 31.5–36.0)
MCV: 92.4 fL (ref 79.5–101.0)
MONO#: 0.6 10*3/uL (ref 0.1–0.9)
MONO%: 10.3 % (ref 0.0–14.0)
NEUT#: 4.1 10*3/uL (ref 1.5–6.5)
NEUT%: 73.6 % (ref 38.4–76.8)
Platelets: 265 10*3/uL (ref 145–400)
RBC: 4.01 10*6/uL (ref 3.70–5.45)
RDW: 14 % (ref 11.2–14.5)
WBC: 5.5 10*3/uL (ref 3.9–10.3)
lymph#: 0.6 10*3/uL — ABNORMAL LOW (ref 0.9–3.3)

## 2017-04-04 LAB — COMPREHENSIVE METABOLIC PANEL
ALBUMIN: 3.4 g/dL — AB (ref 3.5–5.0)
ALK PHOS: 58 U/L (ref 40–150)
ALT: 21 U/L (ref 0–55)
AST: 27 U/L (ref 5–34)
Anion Gap: 9 mEq/L (ref 3–11)
BILIRUBIN TOTAL: 1 mg/dL (ref 0.20–1.20)
BUN: 15.3 mg/dL (ref 7.0–26.0)
CALCIUM: 9.5 mg/dL (ref 8.4–10.4)
CO2: 27 mEq/L (ref 22–29)
Chloride: 107 mEq/L (ref 98–109)
Creatinine: 0.8 mg/dL (ref 0.6–1.1)
EGFR: 70 mL/min/{1.73_m2} — ABNORMAL LOW (ref >90)
Glucose: 91 mg/dl (ref 70–140)
POTASSIUM: 3.8 meq/L (ref 3.5–5.1)
Sodium: 144 mEq/L (ref 136–145)
Total Protein: 5.9 g/dL — ABNORMAL LOW (ref 6.4–8.3)

## 2017-04-04 MED ORDER — MIRTAZAPINE 15 MG PO TABS: 15.0000 mg | ORAL_TABLET | Freq: Every day | ORAL | 2 refills | Status: DC

## 2017-04-04 NOTE — Progress Notes (Signed)
[place] [phone]   Fax:(336) [place], [place] [address]  DIAGNOSIS: Stage IIIB (T2a, N3, M0) non-small cell lung cancer, adenocarcinoma with positive EGFR mutation diagnosed in January 2010.  PRIOR THERAPY: 1) Status post concurrent chemoradiation with weekly carboplatin and paclitaxel; last dose given March 21, 2009.  2) Tarceva at 150 mg p.o. daily, status post approximately 48 months of treatment, discontinued secondary to persistent diarrhea.  3) status post right Pleurx catheter placement for right nonmalignant pleural effusion.  CURRENT THERAPY: Tarceva 100 mg by mouth daily started 03/19/2013, status post 43 months of treatment.  INTERVAL HISTORY: Dana [name] 82 y.o. female returns to the clinic today for follow-up visit. The patient continues to complain of increasing fatigue and weakness especially in the lower extremities. She also has no stamina and shortness of breath with exertion. She has few episodes of diarrhea and she takes Pepto-Bismol with improvement of her symptoms. She denied having any fever or chills. She has no significant chest pain, cough or hemoptysis. She has no weight loss or night sweats. She has no headache or visual changes. She is here today for evaluation and repeat blood work.  MEDICAL HISTORY: Past Medical History:  Diagnosis Date  . Arthritis    HANDS,  WRISTS  . COPD (chronic obstructive pulmonary disease) ([place])   . Dyspnea on exertion   . Encounter for therapeutic drug monitoring 10/01/2016  . GERD (gastroesophageal reflux disease)   . Hemorrhoid   . History of anal fissures   . History of lung cancer ONCOLOGIST--  DR [facility]--  LAST CT ,  NO RECURRENCE OR METS   DX JAN 2010 --  STAGE IIIA  NON-SMALL CELL ADENOCARCINOMA (RIGHT MIDDLE LOBE)---  S/P  CHEMORADIATIO THERAPY  (COMPLETE 03-21-2009)  . lung ca dx'd 2010  . Normal cardiac stress  test    2007  PER PT  . Osteoporosis   . Rash, skin    RIGHT FOREARM/ HAND  . Right wrist pain    MASS  . Wears glasses     ALLERGIES:  is allergic to amoxicillin.  MEDICATIONS:  Current Outpatient Prescriptions  Medication Sig Dispense Refill  . aspirin EC 81 MG tablet Take 81 mg by mouth daily.    Marland Kitchen bismuth subsalicylate (PEPTO BISMOL) 262 MG/15ML suspension Take 30 mLs by mouth every three (3) days as needed.    . Calcium Carbonate-Vitamin D 600-400 MG-UNIT per chew tablet Chew 1 tablet by mouth 2 (two) times daily.    . cholecalciferol (VITAMIN D) 1000 UNITS tablet Take 1,000 Units by mouth daily.     . Coenzyme Q10 (COQ10) 100 MG CAPS Take 1 capsule by mouth daily.     . Cyanocobalamin (B-12) 1000 MCG CAPS Take 1,000 mcg by mouth 3 (three) times daily with meals.     . erlotinib (TARCEVA) 100 MG tablet Take 1 tablet (100 mg total) by mouth daily. Take on an empty stomach 1 hour before meals or 2 hours after 30 tablet 0  . loratadine (CLARITIN) 10 MG tablet Take 10 mg by mouth daily as needed for allergies.     Marland Kitchen LORazepam (ATIVAN) 0.5 MG tablet Take 1 tablet (0.5 mg total) by mouth every 8 (eight) hours as needed for anxiety. 30 tablet 0  . Polyethyl Glycol-Propyl Glycol (SYSTANE OP) Place 1 drop into both eyes at bedtime as needed (for dry eyes.).     Marland Kitchen  clindamycin (CLEOCIN-T) 1 % external solution Apply topically 2 (two) times daily. (Patient not taking: Reported on 04/04/2017) 30 mL 0  . Hydrocodone-Acetaminophen (VICODIN) 5-300 MG TABS Take 1 tablet by mouth 4 (four) times daily as needed (for pain).     Marland Kitchen loperamide (IMODIUM A-D) 2 MG tablet Take 1 mg by mouth 4 (four) times daily as needed for diarrhea or loose stools.     Marland Kitchen oxyCODONE (OXY IR/ROXICODONE) 5 MG immediate release tablet Take 1 tablet (5 mg total) by mouth every 4 (four) hours as needed for moderate pain. (Patient not taking: Reported on 01/29/2017) 30 tablet 0  . psyllium (METAMUCIL) 58.6 % powder Take 1 packet by  mouth daily as needed.    . Simethicone (GAS-X PO) Take 1 tablet by mouth 2 (two) times daily as needed (for gas).      No current facility-administered medications for this visit.     SURGICAL HISTORY:  Past Surgical History:  Procedure Laterality Date  . ANAL FISSURE REPAIR  07/09/2011   INTERNAL SPHINCTEROTOMY  . BENIGN EXCISION LEFT BREAST CENTRAL DUCT  02/1999  . CHEST TUBE INSERTION Right 07/14/2014   Procedure: INSERTION PLEURAL DRAINAGE CATHETER RIGHT CHEST;  Surgeon: Grace Isaac, MD;  Location: [facility];  Service: Thoracic;  Laterality: Right;  . EXCISION RIGHT WRIST MASS  2012  . KNEE ARTHROSCOPY Right 1999  . MASS EXCISION Right 03/11/2014   Procedure: RIGHT WRIST DEEP MASS EXCISION WITH CULTURE AND BIOSPY;  Surgeon: Linna Hoff, MD;  Location: [facility];  Service: Orthopedics;  Laterality: Right;  . REMOVAL OF PLEURAL DRAINAGE CATHETER Right 10/14/2014   Procedure: REMOVAL OF PLEURAL DRAINAGE CATHETER;  Surgeon: Grace Isaac, MD;  Location: [facility];  Service: Thoracic;  Laterality: Right;  . TALC PLEURODESIS Right 09/16/2014   Procedure: TALC PLEURADESIS/ slurry;  Surgeon: Grace Isaac, MD;  Location: [facility];  Service: Thoracic;  Laterality: Right;  . THORACOSCOPY  01-26-2009   w/   lung/  node biopsy's  . THUMB ARTHROSCOPY Left    - removed bone spur  . TONSILLECTOMY  AS CHILD  . TOTAL ABDOMINAL HYSTERECTOMY W/ BILATERAL SALPINGOOPHORECTOMY  1982   W/  APPENDECTOMY  . TRANSTHORACIC ECHOCARDIOGRAM  12-23-2008   NORMAL LV/  EF 65-70%/  MILD MR  &  TR  . VAULT SUSPENSION PLUS CYSTOCELE REPAIR WITH GRAFT  06-13-2010    REVIEW OF SYSTEMS:  Constitutional: positive for fatigue Eyes: negative Ears, nose, mouth, throat, and face: negative Respiratory: positive for dyspnea on exertion Cardiovascular: negative Gastrointestinal: negative Genitourinary:negative Integument/breast: negative Hematologic/lymphatic: negative Musculoskeletal:positive for  muscle weakness Neurological: negative Behavioral/Psych: negative Endocrine: negative Allergic/Immunologic: negative   PHYSICAL EXAMINATION: General appearance: alert, cooperative, fatigued and no distress Head: Normocephalic, without obvious abnormality, atraumatic Neck: no adenopathy, no JVD, supple, symmetrical, trachea midline and thyroid not enlarged, symmetric, no tenderness/mass/nodules Lymph nodes: Cervical, supraclavicular, and axillary nodes normal. Resp: clear to auscultation bilaterally Back: symmetric, no curvature. ROM normal. No CVA tenderness. Cardio: regular rate and rhythm, S1, S2 normal, no murmur, click, rub or gallop GI: soft, non-tender; bowel sounds normal; no masses,  no organomegaly Extremities: extremities normal, atraumatic, no cyanosis or edema Neurologic: Alert and oriented X 3, normal strength and tone. Normal symmetric reflexes. Normal coordination and gait  ECOG PERFORMANCE STATUS: 1 - Symptomatic but completely ambulatory  Blood pressure 138/66, pulse 90, temperature 97.8 F (36.6 C), temperature source Oral, resp. rate 18, height '5\' 5"'$  (1.651 m), weight 115 lb  11.2 oz (52.5 kg), SpO2 99 %.  LABORATORY DATA: Lab Results  Component Value Date   WBC 5.5 04/04/2017   HGB 12.4 04/04/2017   HCT 37.1 04/04/2017   MCV 92.4 04/04/2017   PLT 265 04/04/2017      Chemistry      Component Value Date/Time   NA 144 04/04/2017 0922   K 3.8 04/04/2017 0922   CL 98 07/05/2016 1012   CL 108 (H) 06/03/2013 0939   CO2 27 04/04/2017 0922   BUN 15.3 04/04/2017 0922   CREATININE 0.8 04/04/2017 0922      Component Value Date/Time   CALCIUM 9.5 04/04/2017 0922   ALKPHOS 58 04/04/2017 0922   AST 27 04/04/2017 0922   ALT 21 04/04/2017 0922   BILITOT 1.00 04/04/2017 0922       RADIOGRAPHIC STUDIES: No results found.  ASSESSMENT AND PLAN:  This is a very pleasant 82 years old white female with stage IIIB non-small cell lung cancer, adenocarcinoma with  positive EGFR mutation diagnosed in January 2010. She is status post a course of concurrent chemoradiation and the patient has been on treatment with Tarceva initially at a dose of 150 mg by mouth daily for 48 months. Her dose of Tarceva was reduced to 100 mg by mouth daily status post 44 months. She is tolerating this treatment well except for mild skin rash and few episodes of diarrhea. I recommended for the patient to continue her treatment with Tarceva with the same dose. I reviewed the CT scan images with her again from last month. For the fatigue and depression, I will start the patient on Remeron 15 mg by mouth daily at night time. The patient was also encouraged to continue with her aerobic exercise. For the diarrhea, she will continue on Pepto-Bismol as needed. For the skin rash, the patient will apply clindamycin lotion as needed. I will see her back for follow-up visit in one month's for reevaluation with repeat blood work. She was advised to call immediately if she has any concerning symptoms in the interval. The patient voices understanding of current disease status and treatment options and is in agreement with the current care plan. All questions were answered. The patient knows to call the clinic with any problems, questions or concerns. We can certainly see the patient much sooner if necessary.  I spent 15 minutes counseling the patient face to face. The total time spent in the appointment was 25 minutes.  Disclaimer: This note was dictated with voice recognition software. Similar sounding words can inadvertently be transcribed and may not be corrected upon review.

## 2017-04-04 NOTE — Telephone Encounter (Signed)
Appointments scheduled per 4.12.18 LOS. Patient given AVS report and calendars with future scheduled appointments. °

## 2017-04-11 ENCOUNTER — Other Ambulatory Visit: Payer: Self-pay | Admitting: *Deleted

## 2017-04-11 ENCOUNTER — Telehealth: Payer: Self-pay | Admitting: *Deleted

## 2017-04-11 DIAGNOSIS — C3491 Malignant neoplasm of unspecified part of right bronchus or lung: Secondary | ICD-10-CM

## 2017-04-11 MED ORDER — ERLOTINIB HCL 100 MG PO TABS: 100.0000 mg | ORAL_TABLET | Freq: Every day | ORAL | 0 refills | Status: DC

## 2017-04-11 MED ORDER — MIRTAZAPINE 15 MG PO TABS: 15.0000 mg | ORAL_TABLET | Freq: Every day | ORAL | 2 refills | Status: DC

## 2017-04-11 NOTE — Telephone Encounter (Signed)
Tarceva Rx faxed

## 2017-04-11 NOTE — Telephone Encounter (Signed)
"  I saw Dr. [name] last week.  A new medication was ordered but I have not heard from my [facility] in [facility] on [address]."    This nurse will re-send order to correct pharmacy.  Call transferred to collaborative for further medication assistance with Medication orders.

## 2017-04-15 ENCOUNTER — Other Ambulatory Visit: Payer: Self-pay | Admitting: Medical Oncology

## 2017-05-02 ENCOUNTER — Telehealth: Payer: Self-pay | Admitting: Internal Medicine

## 2017-05-02 ENCOUNTER — Ambulatory Visit (HOSPITAL_BASED_OUTPATIENT_CLINIC_OR_DEPARTMENT_OTHER): Payer: Medicare Other | Admitting: Internal Medicine

## 2017-05-02 ENCOUNTER — Other Ambulatory Visit (HOSPITAL_BASED_OUTPATIENT_CLINIC_OR_DEPARTMENT_OTHER): Payer: Medicare Other

## 2017-05-02 VITALS — BP 108/71 | HR 99 | Temp 99.1°F | Resp 18 | Ht 65.0 in | Wt 117.5 lb

## 2017-05-02 DIAGNOSIS — Z5181 Encounter for therapeutic drug level monitoring: Secondary | ICD-10-CM

## 2017-05-02 DIAGNOSIS — F329 Major depressive disorder, single episode, unspecified: Secondary | ICD-10-CM

## 2017-05-02 DIAGNOSIS — M79672 Pain in left foot: Secondary | ICD-10-CM

## 2017-05-02 DIAGNOSIS — C3491 Malignant neoplasm of unspecified part of right bronchus or lung: Secondary | ICD-10-CM

## 2017-05-02 DIAGNOSIS — F3289 Other specified depressive episodes: Secondary | ICD-10-CM

## 2017-05-02 LAB — CBC WITH DIFFERENTIAL/PLATELET
BASO%: 0.3 % (ref 0.0–2.0)
Basophils Absolute: 0 10*3/uL (ref 0.0–0.1)
EOS%: 6 % (ref 0.0–7.0)
Eosinophils Absolute: 0.4 10*3/uL (ref 0.0–0.5)
HEMATOCRIT: 36.3 % (ref 34.8–46.6)
HGB: 12.1 g/dL (ref 11.6–15.9)
LYMPH%: 15.1 % (ref 14.0–49.7)
MCH: 30.6 pg (ref 25.1–34.0)
MCHC: 33.3 g/dL (ref 31.5–36.0)
MCV: 91.9 fL (ref 79.5–101.0)
MONO#: 0.5 10*3/uL (ref 0.1–0.9)
MONO%: 8.6 % (ref 0.0–14.0)
NEUT%: 70 % (ref 38.4–76.8)
NEUTROS ABS: 4.3 10*3/uL (ref 1.5–6.5)
PLATELETS: 302 10*3/uL (ref 145–400)
RBC: 3.95 10*6/uL (ref 3.70–5.45)
RDW: 14.2 % (ref 11.2–14.5)
WBC: 6.2 10*3/uL (ref 3.9–10.3)
lymph#: 0.9 10*3/uL (ref 0.9–3.3)

## 2017-05-02 LAB — COMPREHENSIVE METABOLIC PANEL
ALT: 20 U/L (ref 0–55)
ANION GAP: 9 meq/L (ref 3–11)
AST: 26 U/L (ref 5–34)
Albumin: 3.5 g/dL (ref 3.5–5.0)
Alkaline Phosphatase: 65 U/L (ref 40–150)
BILIRUBIN TOTAL: 0.8 mg/dL (ref 0.20–1.20)
BUN: 24.5 mg/dL (ref 7.0–26.0)
CO2: 27 meq/L (ref 22–29)
CREATININE: 0.9 mg/dL (ref 0.6–1.1)
Calcium: 9.4 mg/dL (ref 8.4–10.4)
Chloride: 106 mEq/L (ref 98–109)
EGFR: 61 mL/min/{1.73_m2} — ABNORMAL LOW (ref >90)
Glucose: 87 mg/dl (ref 70–140)
Potassium: 3.7 mEq/L (ref 3.5–5.1)
Sodium: 142 mEq/L (ref 136–145)
TOTAL PROTEIN: 6.2 g/dL — AB (ref 6.4–8.3)

## 2017-05-02 NOTE — Progress Notes (Signed)
[place] Telephone:([phone]   Fax:(336) [phone]  OFFICE PROGRESS NOTE  [name], MD [place] [address]  DIAGNOSIS: Stage IIIB (T2a, N3, M0) non-small cell lung cancer, adenocarcinoma with positive EGFR mutation diagnosed in January 2010.  PRIOR THERAPY: 1) Status post concurrent chemoradiation with weekly carboplatin and paclitaxel; last dose given March 21, 2009.  2) Tarceva at 150 mg p.o. daily, status post approximately 48 months of treatment, discontinued secondary to persistent diarrhea.  3) status post right Pleurx catheter placement for right nonmalignant pleural effusion.  CURRENT THERAPY: Tarceva 100 mg by mouth daily started 03/19/2013, status post 49 months of treatment.  INTERVAL HISTORY: LARENDA REEDY 82 y.o. female returns to the clinic today for follow-up visit. The patient is feeling fine today with no specific complaints except for pain in the left foot secondary to bone spurs. She is currently followed by close appendix surgery. She denied having any chest pain, shortness of breath, cough or hemoptysis. She has no fever or chills. She denied having any recent weight loss or night sweats. She still very active. She is here today for evaluation and repeat blood work.   MEDICAL HISTORY: Past Medical History:  Diagnosis Date  . Arthritis    HANDS,  WRISTS  . COPD (chronic obstructive pulmonary disease) ([place])   . Depression 04/04/2017  . Dyspnea on exertion   . Encounter for therapeutic drug monitoring 10/01/2016  . GERD (gastroesophageal reflux disease)   . Hemorrhoid   . History of anal fissures   . History of lung cancer ONCOLOGIST--  DR [facility]--  LAST CT ,  NO RECURRENCE OR METS   DX JAN 2010 --  STAGE IIIA  NON-SMALL CELL ADENOCARCINOMA (RIGHT MIDDLE LOBE)---  S/P  CHEMORADIATIO THERAPY  (COMPLETE 03-21-2009)  . lung ca dx'd 2010  . Normal cardiac stress test    2007  PER PT  . Osteoporosis   . Rash,  skin    RIGHT FOREARM/ HAND  . Right wrist pain    MASS  . Wears glasses     ALLERGIES:  is allergic to amoxicillin.  MEDICATIONS:  Current Outpatient Prescriptions  Medication Sig Dispense Refill  . aspirin EC 81 MG tablet Take 81 mg by mouth daily.    Marland Kitchen bismuth subsalicylate (PEPTO BISMOL) 262 MG/15ML suspension Take 30 mLs by mouth every three (3) days as needed.    . Calcium Carbonate-Vitamin D 600-400 MG-UNIT per chew tablet Chew 1 tablet by mouth 2 (two) times daily.    . cholecalciferol (VITAMIN D) 1000 UNITS tablet Take 1,000 Units by mouth daily.     . clindamycin (CLEOCIN-T) 1 % external solution Apply topically 2 (two) times daily. (Patient not taking: Reported on 04/04/2017) 30 mL 0  . Coenzyme Q10 (COQ10) 100 MG CAPS Take 1 capsule by mouth daily.     . Cyanocobalamin (B-12) 1000 MCG CAPS Take 1,000 mcg by mouth 3 (three) times daily with meals.     . erlotinib (TARCEVA) 100 MG tablet Take 1 tablet (100 mg total) by mouth daily. Take on an empty stomach 1 hour before meals or 2 hours after 30 tablet 0  . Hydrocodone-Acetaminophen (VICODIN) 5-300 MG TABS Take 1 tablet by mouth 4 (four) times daily as needed (for pain).     Marland Kitchen loperamide (IMODIUM A-D) 2 MG tablet Take 1 mg by mouth 4 (four) times daily as needed for diarrhea or loose stools.     Marland Kitchen  loratadine (CLARITIN) 10 MG tablet Take 10 mg by mouth daily as needed for allergies.     Marland Kitchen LORazepam (ATIVAN) 0.5 MG tablet Take 1 tablet (0.5 mg total) by mouth every 8 (eight) hours as needed for anxiety. 30 tablet 0  . mirtazapine (REMERON) 15 MG tablet Take 1 tablet (15 mg total) by mouth at bedtime. 30 tablet 2  . oxyCODONE (OXY IR/ROXICODONE) 5 MG immediate release tablet Take 1 tablet (5 mg total) by mouth every 4 (four) hours as needed for moderate pain. (Patient not taking: Reported on 01/29/2017) 30 tablet 0  . Polyethyl Glycol-Propyl Glycol (SYSTANE OP) Place 1 drop into both eyes at bedtime as needed (for dry eyes.).     Marland Kitchen  psyllium (METAMUCIL) 58.6 % powder Take 1 packet by mouth daily as needed.    . Simethicone (GAS-X PO) Take 1 tablet by mouth 2 (two) times daily as needed (for gas).      No current facility-administered medications for this visit.     SURGICAL HISTORY:  Past Surgical History:  Procedure Laterality Date  . ANAL FISSURE REPAIR  07/09/2011   INTERNAL SPHINCTEROTOMY  . BENIGN EXCISION LEFT BREAST CENTRAL DUCT  02/1999  . CHEST TUBE INSERTION Right 07/14/2014   Procedure: INSERTION PLEURAL DRAINAGE CATHETER RIGHT CHEST;  Surgeon: Grace Isaac, MD;  Location: [facility];  Service: Thoracic;  Laterality: Right;  . EXCISION RIGHT WRIST MASS  2012  . KNEE ARTHROSCOPY Right 1999  . MASS EXCISION Right 03/11/2014   Procedure: RIGHT WRIST DEEP MASS EXCISION WITH CULTURE AND BIOSPY;  Surgeon: Linna Hoff, MD;  Location: [facility];  Service: Orthopedics;  Laterality: Right;  . REMOVAL OF PLEURAL DRAINAGE CATHETER Right 10/14/2014   Procedure: REMOVAL OF PLEURAL DRAINAGE CATHETER;  Surgeon: Grace Isaac, MD;  Location: [facility];  Service: Thoracic;  Laterality: Right;  . TALC PLEURODESIS Right 09/16/2014   Procedure: TALC PLEURADESIS/ slurry;  Surgeon: Grace Isaac, MD;  Location: [facility];  Service: Thoracic;  Laterality: Right;  . THORACOSCOPY  01-26-2009   w/   lung/  node biopsy's  . THUMB ARTHROSCOPY Left    - removed bone spur  . TONSILLECTOMY  AS CHILD  . TOTAL ABDOMINAL HYSTERECTOMY W/ BILATERAL SALPINGOOPHORECTOMY  1982   W/  APPENDECTOMY  . TRANSTHORACIC ECHOCARDIOGRAM  12-23-2008   NORMAL LV/  EF 65-70%/  MILD MR  &  TR  . VAULT SUSPENSION PLUS CYSTOCELE REPAIR WITH GRAFT  06-13-2010    REVIEW OF SYSTEMS:  A comprehensive review of systems was negative except for: Constitutional: positive for fatigue Musculoskeletal: positive for arthralgias   PHYSICAL EXAMINATION: General appearance: alert, cooperative, fatigued and no distress Head: Normocephalic, without  obvious abnormality, atraumatic Neck: no adenopathy, no JVD, supple, symmetrical, trachea midline and thyroid not enlarged, symmetric, no tenderness/mass/nodules Lymph nodes: Cervical, supraclavicular, and axillary nodes normal. Resp: clear to auscultation bilaterally Back: symmetric, no curvature. ROM normal. No CVA tenderness. Cardio: regular rate and rhythm, S1, S2 normal, no murmur, click, rub or gallop GI: soft, non-tender; bowel sounds normal; no masses,  no organomegaly Extremities: extremities normal, atraumatic, no cyanosis or edema  ECOG PERFORMANCE STATUS: 1 - Symptomatic but completely ambulatory  Blood pressure 108/71, pulse 99, temperature 99.1 F (37.3 C), temperature source Oral, resp. rate 18, height '5\' 5"'$  (1.651 m), weight 117 lb 8 oz (53.3 kg), SpO2 99 %.  LABORATORY DATA: Lab Results  Component Value Date   WBC 6.2 05/02/2017   HGB  12.1 05/02/2017   HCT 36.3 05/02/2017   MCV 91.9 05/02/2017   PLT 302 05/02/2017      Chemistry      Component Value Date/Time   NA 142 05/02/2017 1151   K 3.7 05/02/2017 1151   CL 98 07/05/2016 1012   CL 108 (H) 06/03/2013 0939   CO2 27 05/02/2017 1151   BUN 24.5 05/02/2017 1151   CREATININE 0.9 05/02/2017 1151      Component Value Date/Time   CALCIUM 9.4 05/02/2017 1151   ALKPHOS 65 05/02/2017 1151   AST 26 05/02/2017 1151   ALT 20 05/02/2017 1151   BILITOT 0.80 05/02/2017 1151       RADIOGRAPHIC STUDIES: No results found.  ASSESSMENT AND PLAN:  This is a very pleasant 82 years old white female with a stage IIIB non-small cell lung cancer, adenocarcinoma with positive EGFR mutation diagnosed in January 2010. She underwent a course of concurrent chemoradiation followed by treatment with Tarceva initially at a dose of 150 mg by mouth daily for 48 months and she is currently on Tarceva 100 mg by mouth daily status post 49 months. The patient has been tolerating her treatment well with no significant adverse effects. I  recommended for her to continue her current treatment with Tarceva at the same dose. I would see her back for follow-up visit in one month's for evaluation after repeating CT scan of the chest for restaging of her disease. She was advised to call immediately if she has any concerning symptoms in the interval. The patient voices understanding of current disease status and treatment options and is in agreement with the current care plan. All questions were answered. The patient knows to call the clinic with any problems, questions or concerns. We can certainly see the patient much sooner if necessary. I spent 10 minutes counseling the patient face to face. The total time spent in the appointment was 15 minutes.  Disclaimer: This note was dictated with voice recognition software. Similar sounding words can inadvertently be transcribed and may not be corrected upon review.

## 2017-05-02 NOTE — Telephone Encounter (Signed)
Gave patient AVS and calender per 5/10 los. [facility] to contact patient for CT appt.

## 2017-05-04 ENCOUNTER — Encounter: Payer: Self-pay | Admitting: Internal Medicine

## 2017-05-10 ENCOUNTER — Other Ambulatory Visit: Payer: Self-pay | Admitting: Medical Oncology

## 2017-05-10 DIAGNOSIS — C3491 Malignant neoplasm of unspecified part of right bronchus or lung: Secondary | ICD-10-CM

## 2017-05-10 MED ORDER — ERLOTINIB HCL 100 MG PO TABS: 100.0000 mg | ORAL_TABLET | Freq: Every day | ORAL | 2 refills | Status: DC

## 2017-05-15 ENCOUNTER — Ambulatory Visit: Payer: Medicare Other

## 2017-05-15 ENCOUNTER — Other Ambulatory Visit: Payer: Medicare Other

## 2017-05-17 ENCOUNTER — Ambulatory Visit
Admission: RE | Admit: 2017-05-17 | Discharge: 2017-05-17 | Disposition: A | Discharge status: Home / Self Care | Payer: Medicare Other | Source: Ambulatory Visit | Attending: Family Medicine | Admitting: Family Medicine

## 2017-05-17 DIAGNOSIS — M85859 Other specified disorders of bone density and structure, unspecified thigh: Secondary | ICD-10-CM

## 2017-05-17 DIAGNOSIS — Z1231 Encounter for screening mammogram for malignant neoplasm of breast: Secondary | ICD-10-CM

## 2017-05-31 ENCOUNTER — Ambulatory Visit (HOSPITAL_COMMUNITY)
Admission: RE | Admit: 2017-05-31 | Discharge: 2017-05-31 | Disposition: A | Discharge status: Home / Self Care | Payer: Medicare Other | Source: Ambulatory Visit | Attending: Internal Medicine | Admitting: Internal Medicine

## 2017-05-31 ENCOUNTER — Other Ambulatory Visit: Payer: Medicare Other

## 2017-05-31 ENCOUNTER — Other Ambulatory Visit (HOSPITAL_BASED_OUTPATIENT_CLINIC_OR_DEPARTMENT_OTHER): Payer: Medicare Other | Admitting: Lab

## 2017-05-31 DIAGNOSIS — F3289 Other specified depressive episodes: Secondary | ICD-10-CM | POA: Diagnosis not present

## 2017-05-31 DIAGNOSIS — C3491 Malignant neoplasm of unspecified part of right bronchus or lung: Secondary | ICD-10-CM | POA: Diagnosis not present

## 2017-05-31 DIAGNOSIS — Z5181 Encounter for therapeutic drug level monitoring: Secondary | ICD-10-CM

## 2017-05-31 DIAGNOSIS — R911 Solitary pulmonary nodule: Secondary | ICD-10-CM | POA: Insufficient documentation

## 2017-05-31 LAB — CBC WITH DIFFERENTIAL/PLATELET
BASO%: 0.5 % (ref 0.0–2.0)
Basophils Absolute: 0 10*3/uL (ref 0.0–0.1)
EOS ABS: 0.4 10*3/uL (ref 0.0–0.5)
EOS%: 8 % — ABNORMAL HIGH (ref 0.0–7.0)
HCT: 38.2 % (ref 34.8–46.6)
HEMOGLOBIN: 12.8 g/dL (ref 11.6–15.9)
LYMPH%: 12.6 % — ABNORMAL LOW (ref 14.0–49.7)
MCH: 31.4 pg (ref 25.1–34.0)
MCHC: 33.5 g/dL (ref 31.5–36.0)
MCV: 93.9 fL (ref 79.5–101.0)
MONO#: 0.5 10*3/uL (ref 0.1–0.9)
MONO%: 9.5 % (ref 0.0–14.0)
NEUT%: 69.4 % (ref 38.4–76.8)
NEUTROS ABS: 3.4 10*3/uL (ref 1.5–6.5)
PLATELETS: 254 10*3/uL (ref 145–400)
RBC: 4.06 10*6/uL (ref 3.70–5.45)
RDW: 14.6 % — AB (ref 11.2–14.5)
WBC: 4.9 10*3/uL (ref 3.9–10.3)
lymph#: 0.6 10*3/uL — ABNORMAL LOW (ref 0.9–3.3)

## 2017-05-31 LAB — COMPREHENSIVE METABOLIC PANEL
ALT: 28 U/L (ref 0–55)
AST: 27 U/L (ref 5–34)
Albumin: 3.5 g/dL (ref 3.5–5.0)
Alkaline Phosphatase: 70 U/L (ref 40–150)
Anion Gap: 8 mEq/L (ref 3–11)
BUN: 18.7 mg/dL (ref 7.0–26.0)
CALCIUM: 9.4 mg/dL (ref 8.4–10.4)
CHLORIDE: 109 meq/L (ref 98–109)
CO2: 27 meq/L (ref 22–29)
CREATININE: 0.8 mg/dL (ref 0.6–1.1)
EGFR: 71 mL/min/{1.73_m2} — ABNORMAL LOW (ref >90)
Glucose: 85 mg/dl (ref 70–140)
POTASSIUM: 4.1 meq/L (ref 3.5–5.1)
Sodium: 144 mEq/L (ref 136–145)
Total Bilirubin: 0.91 mg/dL (ref 0.20–1.20)
Total Protein: 5.9 g/dL — ABNORMAL LOW (ref 6.4–8.3)

## 2017-05-31 MED ORDER — IOPAMIDOL (ISOVUE-300) INJECTION 61%
75.0000 mL | Freq: Once | INTRAVENOUS | Status: AC | PRN
  Administered 2017-05-31: 75 mL via INTRAVENOUS

## 2017-05-31 MED ORDER — IOPAMIDOL (ISOVUE-300) INJECTION 61%
INTRAVENOUS | Status: AC
  Filled 2017-05-31: qty 75

## 2017-06-04 ENCOUNTER — Ambulatory Visit (HOSPITAL_BASED_OUTPATIENT_CLINIC_OR_DEPARTMENT_OTHER): Payer: Medicare Other | Admitting: Internal Medicine

## 2017-06-04 ENCOUNTER — Encounter: Payer: Self-pay | Admitting: Internal Medicine

## 2017-06-04 ENCOUNTER — Telehealth: Payer: Self-pay | Admitting: Internal Medicine

## 2017-06-04 DIAGNOSIS — R2689 Other abnormalities of gait and mobility: Secondary | ICD-10-CM

## 2017-06-04 DIAGNOSIS — J90 Pleural effusion, not elsewhere classified: Secondary | ICD-10-CM

## 2017-06-04 DIAGNOSIS — Z5181 Encounter for therapeutic drug level monitoring: Secondary | ICD-10-CM

## 2017-06-04 DIAGNOSIS — C3491 Malignant neoplasm of unspecified part of right bronchus or lung: Secondary | ICD-10-CM | POA: Diagnosis not present

## 2017-06-04 HISTORY — DX: Other abnormalities of gait and mobility: R26.89

## 2017-06-04 NOTE — Telephone Encounter (Signed)
Gave patient avs report and appointments for July. [facility] will call re scan.

## 2017-06-04 NOTE — Progress Notes (Signed)
[place] [phone]   [phone]  OFFICE PROGRESS NOTE  [name], MD [place] [address]  DIAGNOSIS: Stage IIIB (T2a, N3, M0) non-small cell lung cancer, adenocarcinoma with positive EGFR mutation diagnosed in January 2010.  PRIOR THERAPY: 1) Status post concurrent chemoradiation with weekly carboplatin and paclitaxel; last dose given March 21, 2009.  2) Tarceva at 150 mg p.o. daily, status post approximately 48 months of treatment, discontinued secondary to persistent diarrhea.  3) status post right Pleurx catheter placement for right nonmalignant pleural effusion.  CURRENT THERAPY: Tarceva 100 mg by mouth daily started 03/19/2013, status post 50 months of treatment.  INTERVAL HISTORY: Dana [name] 82 y.o. female returns to the clinic today for follow-up visit. The patient is feeling fine today with no specific complaints except for gait imbalance that has been going on for few weeks. She denied having any dizzy spells. She denied having any weight loss or night sweats. She has no chest pain, shortness of breath, cough or hemoptysis. The patient has no fever or chills. She has been tolerating her treatment with Tarceva fairly well. She had repeat CT scan of the chest performed recently and she is here for evaluation and discussion of her scan results.   MEDICAL HISTORY: Past Medical History:  Diagnosis Date  . Arthritis    HANDS,  WRISTS  . COPD (chronic obstructive pulmonary disease) ([place])   . Depression 04/04/2017  . Dyspnea on exertion   . Encounter for therapeutic drug monitoring 10/01/2016  . GERD (gastroesophageal reflux disease)   . Hemorrhoid   . History of anal fissures   . History of lung cancer ONCOLOGIST--  [facility]--  LAST CT ,  NO RECURRENCE OR METS   DX JAN 2010 --  STAGE IIIA  NON-SMALL CELL ADENOCARCINOMA (RIGHT MIDDLE LOBE)---  S/P  CHEMORADIATIO THERAPY  (COMPLETE 03-21-2009)  . lung ca  dx'd 2010  . Normal cardiac stress test    2007  PER PT  . Osteoporosis   . Rash, skin    RIGHT FOREARM/ HAND  . Right wrist pain    MASS  . Wears glasses     ALLERGIES:  is allergic to amoxicillin.  MEDICATIONS:  Current Outpatient Prescriptions  Medication Sig Dispense Refill  . aspirin EC 81 MG tablet Take 81 mg by mouth daily.    Marland Kitchen bismuth subsalicylate (PEPTO BISMOL) 262 MG/15ML suspension Take 30 mLs by mouth every three (3) days as needed.    . Calcium Carbonate-Vitamin D 600-400 MG-UNIT per chew tablet Chew 1 tablet by mouth 2 (two) times daily.    . cholecalciferol (VITAMIN D) 1000 UNITS tablet Take 1,000 Units by mouth daily.     . clindamycin (CLEOCIN-T) 1 % external solution Apply topically 2 (two) times daily. (Patient not taking: Reported on 04/04/2017) 30 mL 0  . Coenzyme Q10 (COQ10) 100 MG CAPS Take 1 capsule by mouth daily.     . Cyanocobalamin (B-12) 1000 MCG CAPS Take 1,000 mcg by mouth 3 (three) times daily with meals.     . erlotinib (TARCEVA) 100 MG tablet Take 1 tablet (100 mg total) by mouth daily. Take on an empty stomach 1 hour before meals or 2 hours after 30 tablet 2  . Hydrocodone-Acetaminophen (VICODIN) 5-300 MG TABS Take 1 tablet by mouth 4 (four) times daily as needed (for pain).     Marland Kitchen loperamide (IMODIUM A-D) 2 MG tablet Take 1 mg by  mouth 4 (four) times daily as needed for diarrhea or loose stools.     Marland Kitchen loratadine (CLARITIN) 10 MG tablet Take 10 mg by mouth daily as needed for allergies.     Marland Kitchen LORazepam (ATIVAN) 0.5 MG tablet Take 1 tablet (0.5 mg total) by mouth every 8 (eight) hours as needed for anxiety. 30 tablet 0  . mirtazapine (REMERON) 15 MG tablet Take 1 tablet (15 mg total) by mouth at bedtime. 30 tablet 2  . oxyCODONE (OXY IR/ROXICODONE) 5 MG immediate release tablet Take 1 tablet (5 mg total) by mouth every 4 (four) hours as needed for moderate pain. (Patient not taking: Reported on 01/29/2017) 30 tablet 0  . Polyethyl Glycol-Propyl Glycol  (SYSTANE OP) Place 1 drop into both eyes at bedtime as needed (for dry eyes.).     Marland Kitchen psyllium (METAMUCIL) 58.6 % powder Take 1 packet by mouth daily as needed.    . Simethicone (GAS-X PO) Take 1 tablet by mouth 2 (two) times daily as needed (for gas).      No current facility-administered medications for this visit.     SURGICAL HISTORY:  Past Surgical History:  Procedure Laterality Date  . ANAL FISSURE REPAIR  07/09/2011   INTERNAL SPHINCTEROTOMY  . BENIGN EXCISION LEFT BREAST CENTRAL DUCT  02/1999  . BREAST BIOPSY  01/31/2009   benign  . BREAST EXCISIONAL BIOPSY Left 2004   benign  . CHEST TUBE INSERTION Right 07/14/2014   Procedure: INSERTION PLEURAL DRAINAGE CATHETER RIGHT CHEST;  Surgeon: Grace Isaac, MD;  Location: [facility];  Service: Thoracic;  Laterality: Right;  . EXCISION RIGHT WRIST MASS  2012  . KNEE ARTHROSCOPY Right 1999  . MASS EXCISION Right 03/11/2014   Procedure: RIGHT WRIST DEEP MASS EXCISION WITH CULTURE AND BIOSPY;  Surgeon: Linna Hoff, MD;  Location: [facility];  Service: Orthopedics;  Laterality: Right;  . REMOVAL OF PLEURAL DRAINAGE CATHETER Right 10/14/2014   Procedure: REMOVAL OF PLEURAL DRAINAGE CATHETER;  Surgeon: Grace Isaac, MD;  Location: [facility];  Service: Thoracic;  Laterality: Right;  . TALC PLEURODESIS Right 09/16/2014   Procedure: TALC PLEURADESIS/ slurry;  Surgeon: Grace Isaac, MD;  Location: [facility];  Service: Thoracic;  Laterality: Right;  . THORACOSCOPY  01-26-2009   w/   lung/  node biopsy's  . THUMB ARTHROSCOPY Left    - removed bone spur  . TONSILLECTOMY  AS CHILD  . TOTAL ABDOMINAL HYSTERECTOMY W/ BILATERAL SALPINGOOPHORECTOMY  1982   W/  APPENDECTOMY  . TRANSTHORACIC ECHOCARDIOGRAM  12-23-2008   NORMAL LV/  EF 65-70%/  MILD MR  &  TR  . VAULT SUSPENSION PLUS CYSTOCELE REPAIR WITH GRAFT  06-13-2010    REVIEW OF SYSTEMS:  Constitutional: negative Eyes: negative Ears, nose, mouth, throat, and face:  negative Respiratory: negative Cardiovascular: negative Gastrointestinal: negative Genitourinary:negative Integument/breast: negative Hematologic/lymphatic: negative Musculoskeletal:negative Neurological: negative Behavioral/Psych: negative Endocrine: negative Allergic/Immunologic: negative   PHYSICAL EXAMINATION: General appearance: alert, cooperative, fatigued and no distress Head: Normocephalic, without obvious abnormality, atraumatic Neck: no adenopathy, no JVD, supple, symmetrical, trachea midline and thyroid not enlarged, symmetric, no tenderness/mass/nodules Lymph nodes: Cervical, supraclavicular, and axillary nodes normal. Resp: clear to auscultation bilaterally Back: symmetric, no curvature. ROM normal. No CVA tenderness. Cardio: regular rate and rhythm, S1, S2 normal, no murmur, click, rub or gallop GI: soft, non-tender; bowel sounds normal; no masses,  no organomegaly Extremities: extremities normal, atraumatic, no cyanosis or edema Neurologic: Gait: Abnormal  ECOG PERFORMANCE STATUS: 1 -  Symptomatic but completely ambulatory  There were no vitals taken for this visit.  LABORATORY DATA: Lab Results  Component Value Date   WBC 4.9 05/31/2017   HGB 12.8 05/31/2017   HCT 38.2 05/31/2017   MCV 93.9 05/31/2017   PLT 254 05/31/2017      Chemistry      Component Value Date/Time   NA 144 05/31/2017 1014   K 4.1 05/31/2017 1014   CL 98 07/05/2016 1012   CL 108 (H) 06/03/2013 0939   CO2 27 05/31/2017 1014   BUN 18.7 05/31/2017 1014   CREATININE 0.8 05/31/2017 1014      Component Value Date/Time   CALCIUM 9.4 05/31/2017 1014   ALKPHOS 70 05/31/2017 1014   AST 27 05/31/2017 1014   ALT 28 05/31/2017 1014   BILITOT 0.91 05/31/2017 1014       RADIOGRAPHIC STUDIES: Ct Chest W Contrast  Result Date: 05/31/2017 CLINICAL DATA:  Followup pulmonary nodule RIGHT lung cancer chemotherapy and radiation therapy. Oral therapy ongoing EXAM: CT CHEST WITH CONTRAST  TECHNIQUE: Multidetector CT imaging of the chest was performed during intravenous contrast administration. CONTRAST:  22m ISOVUE-300 IOPAMIDOL (ISOVUE-300) INJECTION 61% COMPARISON:  None. FINDINGS: Cardiovascular: Coronary artery calcification and aortic atherosclerotic calcification. Mediastinum/Nodes: No axillary supraclavicular adenopathy. No mediastinal or hilar adenopathy. Diffuse thickening of the esophagus. Lungs/Pleura: Linear postradiation fibrotic change in the RIGHT hilum and RIGHT lower lobe. No new nodularity. Calcified / high-density pleural surface in the RIGHT lower lobe is unchanged Ground-glass nodule in the LEFT lung apex measures 15 mm x 10 mm compared to 15 mm x 15 mm on prior for no significant change. The solid component of this lesion measures 5 mm compared to 5 mm for no change (image 19, series 5). Peripheral sub solid nodule in the LEFT upper lobe measuring 7 mm (image 58, series 5) is also unchanged from 8 mm. Upper Abdomen: Limited view of the liver, kidneys, pancreas are unremarkable. Normal adrenal glands. Musculoskeletal: No aggressive osseous lesion.  Multiple IMPRESSION: 1. Stable mixed solid and sub solid nodule in the LEFT upper lobe has suspicious in morphology for low-grade adenocarcinoma (atypical adenomatous hyperplasia). Recommend follow-up CT in 6 months. 2. No new pulmonary nodules. 3. Post radiation change in the LEFT lung. Electronically Signed   By: SSuzy BouchardM.D.   On: 05/31/2017 13:43   Dg Bone Density (dxa)  Result Date: 05/17/2017 EXAM: DUAL X-RAY ABSORPTIOMETRY (DXA) FOR BONE MINERAL DENSITY IMPRESSION: Referring Physician:  MLeitha BleakPATIENT: Name: Dana [name], OTTAWAYPatient ID: [id number] Date: 0June 19, 1936Height: 62.5 in. Sex: Female Measured: 05/17/2017 Weight: 117.2 lbs. Indications: Advanced Age, Bilateral Ovariectomy (65.51), Caucasian, Estrogen Deficient, Hysterectomy, Postmenopausal Fractures: Ankle Treatments: Calcium (E943.0), Vitamin  D (E933.5) ASSESSMENT: The BMD measured at Femur Neck Right is 0.664 g/cm2 with a T-score of -2.7. This patient is considered OSTEOPOROTIC according to [place] criteria. Lumbar spine was not utilized due to being excluded in prior exams. There has been a statistically significant decrease in BMD of Left hip since prior exam dated 10/18/2014. Site Region Measured Date Measured Age YA T-score BMD Significant CHANGE DualFemur Neck Right 05/17/2017 80.3 -2.7 0.664 g/cm2 Left Forearm Radius 33% 05/17/2017 80.3 -2.2 0.695 g/cm2 World Health Organization ([facility] criteria for post-menopausal, Caucasian Women: Normal       T-score at or above -1 SD Osteopenia   T-score between -1 and -2.5 SD Osteoporosis T-score at or below -2.5 SD RECOMMENDATION: [place]recommends that FDA-approved medical  therapies be considered in postmenopausal women and men age 61 or older with a: 1. Hip or vertebral (clinical or morphometric) fracture. 2. T-score of <-2.5 at the spine or hip. 3. Ten-year fracture probability by FRAX of 3% or greater for hip fracture or 20% or greater for major osteoporotic fracture. All treatment decisions require clinical judgment and consideration of individual patient factors, including patient preferences, co-morbidities, previous drug use, risk factors not captured in the FRAX model (e.g. falls, vitamin D deficiency, increased bone turnover, interval significant decline in bone density) and possible under - or over-estimation of fracture risk by FRAX. All patients should ensure an adequate intake of dietary calcium (1200 mg/d) and vitamin D (800 IU daily) unless contraindicated. FOLLOW-UP: People with diagnosed cases of osteoporosis or at high risk for fracture should have regular bone mineral density tests. For patients eligible for Medicare, routine testing is allowed once every 2 years. The testing frequency can be increased to one year for patients who have  rapidly progressing disease, those who are receiving or discontinuing medical therapy to restore bone mass, or have additional risk factors. I have reviewed this report, and agree with the above findings. Mark A. Thornton Papas, M.D. [facility] Radiology Electronically Signed   By: Lavonia Dana M.D.   On: 05/17/2017 11:15   Mm Digital Screening Bilateral  Result Date: 05/21/2017 CLINICAL DATA:  Screening. EXAM: DIGITAL SCREENING BILATERAL MAMMOGRAM WITH CAD COMPARISON:  Previous exam(s). ACR Breast Density Category c: The breast tissue is heterogeneously dense, which may obscure small masses. FINDINGS: There are no findings suspicious for malignancy. Images were processed with CAD. IMPRESSION: No mammographic evidence of malignancy. A result letter of this screening mammogram will be mailed directly to the patient. RECOMMENDATION: Screening mammogram in one year. (Code:SM-B-01Y) BI-RADS CATEGORY  1: Negative. Electronically Signed   By: Franki Cabot M.D.   On: 05/21/2017 09:56    ASSESSMENT AND PLAN:  This is a very pleasant 82 years old white female with stage IIIB non-small cell lung cancer, adenocarcinoma with positive EGFR mutation diagnosed in January 2010. She has been on treatment with targeted therapy for close to 100 months including 50 months of Tarceva 100 mg by mouth daily. She is tolerating her treatment well with no significant adverse effects. The patient had repeat CT scan of the chest performed recently. I personally and independently reviewed the scan and discuss the results with the patient today. Her scan showed no evidence for disease progression. I recommended for her to continue her current treatment with Tarceva 100 mg by mouth daily. I will see her back for follow-up visit in 6 weeks for reevaluation with repeat blood work. For the gait imbalance, I will order MRI of the brain to rule out any brain metastasis. The patient was advised to call immediately if she has any concerning  symptoms in the interval. The patient voices understanding of current disease status and treatment options and is in agreement with the current care plan. All questions were answered. The patient knows to call the clinic with any problems, questions or concerns. We can certainly see the patient much sooner if necessary. Disclaimer: This note was dictated with voice recognition software. Similar sounding words can inadvertently be transcribed and may not be corrected upon review.

## 2017-06-11 ENCOUNTER — Telehealth: Payer: Self-pay | Admitting: Medical Oncology

## 2017-06-11 NOTE — Telephone Encounter (Signed)
No contraindication.

## 2017-06-11 NOTE — Telephone Encounter (Signed)
Bone Density results -osteoporosis . She was given option of Reclast (IV) or  prolia (injection q 6 months). She does not want to do Fosamax. Is there any contraindication with her Tarceva.?   I told pt we do not administer Reclast here.

## 2017-06-12 ENCOUNTER — Telehealth: Payer: Self-pay | Admitting: *Deleted

## 2017-06-12 NOTE — Telephone Encounter (Signed)
Called pt, spoke with husband gave message per MD no contraindication with Tarceva, regarding Prolia/Reclast. Mr elliona doddridge me for the call advised pt would get message.

## 2017-06-18 NOTE — Telephone Encounter (Signed)
Pt notified. She has decided to take prolia.

## 2017-07-09 ENCOUNTER — Other Ambulatory Visit: Payer: Self-pay | Admitting: Internal Medicine

## 2017-07-09 DIAGNOSIS — C3491 Malignant neoplasm of unspecified part of right bronchus or lung: Secondary | ICD-10-CM

## 2017-07-10 ENCOUNTER — Ambulatory Visit (HOSPITAL_COMMUNITY)
Admission: RE | Admit: 2017-07-10 | Discharge: 2017-07-10 | Disposition: A | Discharge status: Home / Self Care | Payer: Medicare Other | Source: Ambulatory Visit | Attending: Internal Medicine | Admitting: Internal Medicine

## 2017-07-10 DIAGNOSIS — R2689 Other abnormalities of gait and mobility: Secondary | ICD-10-CM | POA: Diagnosis not present

## 2017-07-10 DIAGNOSIS — J90 Pleural effusion, not elsewhere classified: Secondary | ICD-10-CM | POA: Diagnosis not present

## 2017-07-10 DIAGNOSIS — Z5181 Encounter for therapeutic drug level monitoring: Secondary | ICD-10-CM | POA: Diagnosis not present

## 2017-07-10 DIAGNOSIS — C3491 Malignant neoplasm of unspecified part of right bronchus or lung: Secondary | ICD-10-CM | POA: Diagnosis present

## 2017-07-10 MED ORDER — GADOBENATE DIMEGLUMINE 529 MG/ML IV SOLN
10.0000 mL | Freq: Once | INTRAVENOUS | Status: AC | PRN
  Administered 2017-07-10: 10 mL via INTRAVENOUS

## 2017-07-15 ENCOUNTER — Ambulatory Visit (HOSPITAL_BASED_OUTPATIENT_CLINIC_OR_DEPARTMENT_OTHER): Payer: Medicare Other | Admitting: Internal Medicine

## 2017-07-15 ENCOUNTER — Encounter: Payer: Self-pay | Admitting: Internal Medicine

## 2017-07-15 ENCOUNTER — Telehealth: Payer: Self-pay | Admitting: Internal Medicine

## 2017-07-15 ENCOUNTER — Other Ambulatory Visit (HOSPITAL_BASED_OUTPATIENT_CLINIC_OR_DEPARTMENT_OTHER): Payer: Medicare Other

## 2017-07-15 VITALS — BP 144/87 | HR 90 | Temp 97.7°F | Resp 18 | Ht 65.0 in | Wt 115.0 lb

## 2017-07-15 DIAGNOSIS — C3491 Malignant neoplasm of unspecified part of right bronchus or lung: Secondary | ICD-10-CM

## 2017-07-15 DIAGNOSIS — R2689 Other abnormalities of gait and mobility: Secondary | ICD-10-CM

## 2017-07-15 DIAGNOSIS — J90 Pleural effusion, not elsewhere classified: Secondary | ICD-10-CM

## 2017-07-15 DIAGNOSIS — Z5181 Encounter for therapeutic drug level monitoring: Secondary | ICD-10-CM

## 2017-07-15 LAB — COMPREHENSIVE METABOLIC PANEL
ALBUMIN: 3.6 g/dL (ref 3.5–5.0)
ALK PHOS: 66 U/L (ref 40–150)
ALT: 20 U/L (ref 0–55)
AST: 26 U/L (ref 5–34)
Anion Gap: 8 mEq/L (ref 3–11)
BUN: 18.3 mg/dL (ref 7.0–26.0)
CALCIUM: 10 mg/dL (ref 8.4–10.4)
CHLORIDE: 105 meq/L (ref 98–109)
CO2: 28 mEq/L (ref 22–29)
CREATININE: 0.8 mg/dL (ref 0.6–1.1)
EGFR: 66 mL/min/{1.73_m2} — ABNORMAL LOW (ref >90)
Glucose: 94 mg/dl (ref 70–140)
POTASSIUM: 3.9 meq/L (ref 3.5–5.1)
Sodium: 141 mEq/L (ref 136–145)
Total Bilirubin: 0.9 mg/dL (ref 0.20–1.20)
Total Protein: 6.3 g/dL — ABNORMAL LOW (ref 6.4–8.3)

## 2017-07-15 LAB — CBC WITH DIFFERENTIAL/PLATELET
BASO%: 0.5 % (ref 0.0–2.0)
BASOS ABS: 0 10*3/uL (ref 0.0–0.1)
EOS ABS: 0.2 10*3/uL (ref 0.0–0.5)
EOS%: 4.7 % (ref 0.0–7.0)
HEMATOCRIT: 39.5 % (ref 34.8–46.6)
HEMOGLOBIN: 13.2 g/dL (ref 11.6–15.9)
LYMPH#: 0.6 10*3/uL — AB (ref 0.9–3.3)
LYMPH%: 11.3 % — ABNORMAL LOW (ref 14.0–49.7)
MCH: 31.3 pg (ref 25.1–34.0)
MCHC: 33.4 g/dL (ref 31.5–36.0)
MCV: 93.7 fL (ref 79.5–101.0)
MONO#: 0.4 10*3/uL (ref 0.1–0.9)
MONO%: 8.5 % (ref 0.0–14.0)
NEUT#: 3.7 10*3/uL (ref 1.5–6.5)
NEUT%: 75 % (ref 38.4–76.8)
Platelets: 264 10*3/uL (ref 145–400)
RBC: 4.22 10*6/uL (ref 3.70–5.45)
RDW: 14.4 % (ref 11.2–14.5)
WBC: 5 10*3/uL (ref 3.9–10.3)

## 2017-07-15 NOTE — Progress Notes (Signed)
[place] [phone]   [phone]  OFFICE PROGRESS NOTE  [name], MD [place] [address]  DIAGNOSIS: Stage IIIB (T2a, N3, M0) non-small cell lung cancer, adenocarcinoma with positive EGFR mutation diagnosed in January 2010.  PRIOR THERAPY: 1) Status post concurrent chemoradiation with weekly carboplatin and paclitaxel; last dose given March 21, 2009.  2) Tarceva at 150 mg p.o. daily, status post approximately 48 months of treatment, discontinued secondary to persistent diarrhea.  3) status post right Pleurx catheter placement for right nonmalignant pleural effusion.  CURRENT THERAPY: Tarceva 100 mg by mouth daily started 03/19/2013, status post 52 months of treatment.  INTERVAL HISTORY: Dana [name] 82 y.o. female (the clinic today for follow-up visit. The patient is feeling fine today was no specific complaints except for mild fatigue. She had a recent MRI of the brain for evaluation of imbalance of her gait and that was unremarkable for any metastatic or acute abnormalities in the brain. The patient denied having any weight loss or night sweats. She has no nausea, vomiting, diarrhea or constipation. She has no fever or chills. She is here today for evaluation and repeat blood work.  MEDICAL HISTORY: Past Medical History:  Diagnosis Date  . Arthritis    HANDS,  WRISTS  . COPD (chronic obstructive pulmonary disease) ([place])   . Depression 04/04/2017  . Dyspnea on exertion   . Encounter for therapeutic drug monitoring 10/01/2016  . GERD (gastroesophageal reflux disease)   . Hemorrhoid   . History of anal fissures   . History of lung cancer ONCOLOGIST--  DR [facility]--  LAST CT ,  NO RECURRENCE OR METS   DX JAN 2010 --  STAGE IIIA  NON-SMALL CELL ADENOCARCINOMA (RIGHT MIDDLE LOBE)---  S/P  CHEMORADIATIO THERAPY  (COMPLETE 03-21-2009)  . Imbalance 06/04/2017  . lung ca dx'd 2010  . Normal cardiac stress test    2007   PER PT  . Osteoporosis   . Rash, skin    RIGHT FOREARM/ HAND  . Right wrist pain    MASS  . Wears glasses     ALLERGIES:  is allergic to amoxicillin.  MEDICATIONS:  Current Outpatient Prescriptions  Medication Sig Dispense Refill  . aspirin EC 81 MG tablet Take 81 mg by mouth daily.    Marland Kitchen bismuth subsalicylate (PEPTO BISMOL) 262 MG/15ML suspension Take 30 mLs by mouth every three (3) days as needed.    . Calcium Carbonate-Vitamin D 600-400 MG-UNIT per chew tablet Chew 1 tablet by mouth 2 (two) times daily.    . cholecalciferol (VITAMIN D) 1000 UNITS tablet Take 1,000 Units by mouth daily.     . clindamycin (CLEOCIN-T) 1 % external solution Apply topically 2 (two) times daily. 30 mL 0  . Coenzyme Q10 (COQ10) 100 MG CAPS Take 1 capsule by mouth daily.     . Cyanocobalamin (B-12) 1000 MCG CAPS Take 1,000 mcg by mouth 3 (three) times daily with meals.     . erlotinib (TARCEVA) 100 MG tablet Take 1 tablet (100 mg total) by mouth daily. Take on an empty stomach 1 hour before meals or 2 hours after 30 tablet 2  . Hydrocodone-Acetaminophen (VICODIN) 5-300 MG TABS Take 1 tablet by mouth 4 (four) times daily as needed (for pain).     Marland Kitchen loperamide (IMODIUM A-D) 2 MG tablet Take 1 mg by mouth 4 (four) times daily as needed for diarrhea or loose stools.     Marland Kitchen  loratadine (CLARITIN) 10 MG tablet Take 10 mg by mouth daily as needed for allergies.     Marland Kitchen LORazepam (ATIVAN) 0.5 MG tablet Take 1 tablet (0.5 mg total) by mouth every 8 (eight) hours as needed for anxiety. 30 tablet 0  . mirtazapine (REMERON) 15 MG tablet TAKE 1 TABLET BY MOUTH ONCE DAILY AT BEDTIME 30 tablet 2  . oxyCODONE (OXY IR/ROXICODONE) 5 MG immediate release tablet Take 1 tablet (5 mg total) by mouth every 4 (four) hours as needed for moderate pain. (Patient not taking: Reported on 01/29/2017) 30 tablet 0  . Polyethyl Glycol-Propyl Glycol (SYSTANE OP) Place 1 drop into both eyes at bedtime as needed (for dry eyes.).     Marland Kitchen psyllium  (METAMUCIL) 58.6 % powder Take 1 packet by mouth daily as needed.    . Simethicone (GAS-X PO) Take 1 tablet by mouth 2 (two) times daily as needed (for gas).      No current facility-administered medications for this visit.     SURGICAL HISTORY:  Past Surgical History:  Procedure Laterality Date  . ANAL FISSURE REPAIR  07/09/2011   INTERNAL SPHINCTEROTOMY  . BENIGN EXCISION LEFT BREAST CENTRAL DUCT  02/1999  . BREAST BIOPSY  01/31/2009   benign  . BREAST EXCISIONAL BIOPSY Left 2004   benign  . CHEST TUBE INSERTION Right 07/14/2014   Procedure: INSERTION PLEURAL DRAINAGE CATHETER RIGHT CHEST;  Surgeon: Delight Ovens, MD;  Location: [facility] OR;  Service: Thoracic;  Laterality: Right;  . EXCISION RIGHT WRIST MASS  2012  . KNEE ARTHROSCOPY Right 1999  . MASS EXCISION Right 03/11/2014   Procedure: RIGHT WRIST DEEP MASS EXCISION WITH CULTURE AND BIOSPY;  Surgeon: Sharma Covert, MD;  Location: [facility];  Service: Orthopedics;  Laterality: Right;  . REMOVAL OF PLEURAL DRAINAGE CATHETER Right 10/14/2014   Procedure: REMOVAL OF PLEURAL DRAINAGE CATHETER;  Surgeon: Delight Ovens, MD;  Location: [facility] OR;  Service: Thoracic;  Laterality: Right;  . TALC PLEURODESIS Right 09/16/2014   Procedure: TALC PLEURADESIS/ slurry;  Surgeon: Delight Ovens, MD;  Location: [facility] OR;  Service: Thoracic;  Laterality: Right;  . THORACOSCOPY  01-26-2009   w/   lung/  node biopsy's  . THUMB ARTHROSCOPY Left    - removed bone spur  . TONSILLECTOMY  AS CHILD  . TOTAL ABDOMINAL HYSTERECTOMY W/ BILATERAL SALPINGOOPHORECTOMY  1982   W/  APPENDECTOMY  . TRANSTHORACIC ECHOCARDIOGRAM  12-23-2008   NORMAL LV/  EF 65-70%/  MILD MR  &  TR  . VAULT SUSPENSION PLUS CYSTOCELE REPAIR WITH GRAFT  06-13-2010    REVIEW OF SYSTEMS:  A comprehensive review of systems was negative except for: Constitutional: positive for fatigue   PHYSICAL EXAMINATION: General appearance: alert, cooperative, fatigued and no  distress Head: Normocephalic, without obvious abnormality, atraumatic Neck: no adenopathy, no JVD, supple, symmetrical, trachea midline and thyroid not enlarged, symmetric, no tenderness/mass/nodules Lymph nodes: Cervical, supraclavicular, and axillary nodes normal. Resp: clear to auscultation bilaterally Back: symmetric, no curvature. ROM normal. No CVA tenderness. Cardio: regular rate and rhythm, S1, S2 normal, no murmur, click, rub or gallop GI: soft, non-tender; bowel sounds normal; no masses,  no organomegaly Extremities: extremities normal, atraumatic, no cyanosis or edema  ECOG PERFORMANCE STATUS: 1 - Symptomatic but completely ambulatory  Blood pressure (!) 144/87, pulse 90, temperature 97.7 F (36.5 C), temperature source Oral, resp. rate 18, height 5\' 5"  (1.651 m), weight 115 lb (52.2 kg), SpO2 98 %.  LABORATORY DATA: Lab  Results  Component Value Date   WBC 5.0 07/15/2017   HGB 13.2 07/15/2017   HCT 39.5 07/15/2017   MCV 93.7 07/15/2017   PLT 264 07/15/2017      Chemistry      Component Value Date/Time   NA 141 07/15/2017 1102   K 3.9 07/15/2017 1102   CL 98 07/05/2016 1012   CL 108 (H) 06/03/2013 0939   CO2 28 07/15/2017 1102   BUN 18.3 07/15/2017 1102   CREATININE 0.8 07/15/2017 1102      Component Value Date/Time   CALCIUM 10.0 07/15/2017 1102   ALKPHOS 66 07/15/2017 1102   AST 26 07/15/2017 1102   ALT 20 07/15/2017 1102   BILITOT 0.90 07/15/2017 1102       RADIOGRAPHIC STUDIES: Mr Jeri Cos ZO Contrast  Result Date: 07/10/2017 CLINICAL DATA:  Lung cancer.  Imbalance. EXAM: MRI HEAD WITHOUT AND WITH CONTRAST TECHNIQUE: Multiplanar, multiecho pulse sequences of the brain and surrounding structures were obtained without and with intravenous contrast. CONTRAST:  56m MULTIHANCE GADOBENATE DIMEGLUMINE 529 MG/ML IV SOLN COMPARISON:  CT of the head without and with contrast 11/23/2010 FINDINGS: Brain: Atrophy and white matter changes are moderately advanced for  age. No focal enhancement is present. There is no evidence for metastatic disease. No acute infarct or hemorrhage is present. The ventricles are proportionate to the degree of atrophy. No significant extra-axial fluid collection is present. Vascular: Flow is present in the major intracranial arteries. Skull and upper cervical spine: The skullbase is within normal limits. The craniocervical junction is normal. Midline sagittal structures are unremarkable. The upper cervical spine demonstrates mild degenerative change. Sinuses/Orbits: The paranasal sinuses and mastoid air cells are clear. A left lens replacement is present. The globes and orbits are otherwise within normal limits. IMPRESSION: 1. No pathologic enhancement to suggest metastatic disease to the brain or meninges. 2. Atrophy and white matter disease is moderately advanced for age. This likely reflects the sequela of chronic microvascular ischemia. Electronically Signed   By: CSan MorelleM.D.   On: 07/10/2017 11:09    ASSESSMENT AND PLAN:  This is a very pleasant 82years old white female with stage IIIB non-small cell lung cancer, adenocarcinoma with positive EGFR mutation diagnosed in January 2010. She has been on treatment with targeted therapy for close to 100 months including 52 months of Tarceva 100 mg by mouth daily. She is tolerating her treatment well with no significant adverse effects. The MRI of the brain was unremarkable for metastatic brain lesions. I recommended for the patient to continue with her current treatment with Tarceva 100 mg by mouth daily. I will see her back again in 6 weeks for reevaluation with repeat blood work. She was advised to call immediately if she has any concerning symptoms in the interval. The patient voices understanding of current disease status and treatment options and is in agreement with the current care plan. All questions were answered. The patient knows to call the clinic with any  problems, questions or concerns. We can certainly see the patient much sooner if necessary. Disclaimer: This note was dictated with voice recognition software. Similar sounding words can inadvertently be transcribed and may not be corrected upon review.

## 2017-07-15 NOTE — Telephone Encounter (Signed)
Scheduled appt per 7/23 los - Gave patient AVS and calender per los.

## 2017-07-31 ENCOUNTER — Other Ambulatory Visit: Payer: Self-pay | Admitting: Medical Oncology

## 2017-07-31 DIAGNOSIS — C3491 Malignant neoplasm of unspecified part of right bronchus or lung: Secondary | ICD-10-CM

## 2017-07-31 MED ORDER — ERLOTINIB HCL 100 MG PO TABS: 100.0000 mg | ORAL_TABLET | Freq: Every day | ORAL | 2 refills | Status: DC

## 2017-08-29 ENCOUNTER — Encounter: Payer: Self-pay | Admitting: Internal Medicine

## 2017-08-29 ENCOUNTER — Other Ambulatory Visit (HOSPITAL_BASED_OUTPATIENT_CLINIC_OR_DEPARTMENT_OTHER): Payer: Medicare Other

## 2017-08-29 ENCOUNTER — Telehealth: Payer: Self-pay | Admitting: Internal Medicine

## 2017-08-29 ENCOUNTER — Ambulatory Visit (HOSPITAL_BASED_OUTPATIENT_CLINIC_OR_DEPARTMENT_OTHER): Payer: Medicare Other | Admitting: Internal Medicine

## 2017-08-29 VITALS — BP 125/75 | HR 85 | Temp 98.5°F | Resp 18 | Ht 65.0 in | Wt 115.3 lb

## 2017-08-29 DIAGNOSIS — Z5181 Encounter for therapeutic drug level monitoring: Secondary | ICD-10-CM

## 2017-08-29 DIAGNOSIS — C3491 Malignant neoplasm of unspecified part of right bronchus or lung: Secondary | ICD-10-CM | POA: Diagnosis not present

## 2017-08-29 DIAGNOSIS — C3490 Malignant neoplasm of unspecified part of unspecified bronchus or lung: Secondary | ICD-10-CM

## 2017-08-29 LAB — COMPREHENSIVE METABOLIC PANEL
ALBUMIN: 3.4 g/dL — AB (ref 3.5–5.0)
ALK PHOS: 64 U/L (ref 40–150)
ALT: 18 U/L (ref 0–55)
AST: 27 U/L (ref 5–34)
Anion Gap: 8 mEq/L (ref 3–11)
BILIRUBIN TOTAL: 0.99 mg/dL (ref 0.20–1.20)
BUN: 21.8 mg/dL (ref 7.0–26.0)
CALCIUM: 9.9 mg/dL (ref 8.4–10.4)
CHLORIDE: 106 meq/L (ref 98–109)
CO2: 28 mEq/L (ref 22–29)
CREATININE: 0.9 mg/dL (ref 0.6–1.1)
EGFR: 64 mL/min/{1.73_m2} — ABNORMAL LOW (ref >90)
Glucose: 80 mg/dl (ref 70–140)
Potassium: 4 mEq/L (ref 3.5–5.1)
Sodium: 142 mEq/L (ref 136–145)
TOTAL PROTEIN: 6.2 g/dL — AB (ref 6.4–8.3)

## 2017-08-29 LAB — CBC WITH DIFFERENTIAL/PLATELET
BASO%: 0.2 % (ref 0.0–2.0)
Basophils Absolute: 0 10*3/uL (ref 0.0–0.1)
EOS%: 6.7 % (ref 0.0–7.0)
Eosinophils Absolute: 0.4 10*3/uL (ref 0.0–0.5)
HCT: 39.5 % (ref 34.8–46.6)
HEMOGLOBIN: 13.3 g/dL (ref 11.6–15.9)
LYMPH#: 0.9 10*3/uL (ref 0.9–3.3)
LYMPH%: 16 % (ref 14.0–49.7)
MCH: 31.5 pg (ref 25.1–34.0)
MCHC: 33.7 g/dL (ref 31.5–36.0)
MCV: 93.6 fL (ref 79.5–101.0)
MONO#: 0.5 10*3/uL (ref 0.1–0.9)
MONO%: 8.1 % (ref 0.0–14.0)
NEUT#: 3.9 10*3/uL (ref 1.5–6.5)
NEUT%: 69 % (ref 38.4–76.8)
PLATELETS: 256 10*3/uL (ref 145–400)
RBC: 4.22 10*6/uL (ref 3.70–5.45)
RDW: 14.3 % (ref 11.2–14.5)
WBC: 5.7 10*3/uL (ref 3.9–10.3)

## 2017-08-29 NOTE — Telephone Encounter (Signed)
Gave patient avs and calendar for October appointment

## 2017-08-29 NOTE — Progress Notes (Signed)
[place] Telephone:([phone]   Fax:(336) [phone]  OFFICE PROGRESS NOTE  [name], MD [place] [address]  DIAGNOSIS: Stage IIIB (T2a, N3, M0) non-small cell lung cancer, adenocarcinoma with positive EGFR mutation diagnosed in January 2010.  PRIOR THERAPY: 1) Status post concurrent chemoradiation with weekly carboplatin and paclitaxel; last dose given March 21, 2009.  2) Tarceva at 150 mg p.o. daily, status post approximately 48 months of treatment, discontinued secondary to persistent diarrhea.  3) status post right Pleurx catheter placement for right nonmalignant pleural effusion.  CURRENT THERAPY: Tarceva 100 mg by mouth daily started 03/19/2013, status post 54 months of treatment.  INTERVAL HISTORY: Dana [name] 82 y.o. female returns to the clinic today for follow-up visit. The patient is currently on treatment with Tarceva 100 mg by mouth daily and continues to tolerate her treatment well with no adverse effects. She denied having any skin rash or diarrhea. The lesion in her right wrist were diagnosed as rheumatoid nodules by her hand surgeon. She denied having any recent weight loss or night sweats. She has no nausea, vomiting, diarrhea or constipation. She has no fever or chills. She denied having any chest pain, shortness breath, cough or hemoptysis. She is here today for evaluation and repeat blood work.  MEDICAL HISTORY: Past Medical History:  Diagnosis Date  . Arthritis    HANDS,  WRISTS  . COPD (chronic obstructive pulmonary disease) ([place])   . Depression 04/04/2017  . Dyspnea on exertion   . Encounter for therapeutic drug monitoring 10/01/2016  . GERD (gastroesophageal reflux disease)   . Hemorrhoid   . History of anal fissures   . History of lung cancer ONCOLOGIST--  DR [facility]--  LAST CT ,  NO RECURRENCE OR METS   DX JAN 2010 --  STAGE IIIA  NON-SMALL CELL ADENOCARCINOMA (RIGHT MIDDLE LOBE)---  S/P  CHEMORADIATIO  THERAPY  (COMPLETE 03-21-2009)  . Imbalance 06/04/2017  . lung ca dx'd 2010  . Normal cardiac stress test    2007  PER PT  . Osteoporosis   . Rash, skin    RIGHT FOREARM/ HAND  . Right wrist pain    MASS  . Wears glasses     ALLERGIES:  is allergic to amoxicillin.  MEDICATIONS:  Current Outpatient Prescriptions  Medication Sig Dispense Refill  . alendronate (FOSAMAX) 70 MG tablet Take 70 mg by mouth once a week.    Marland Kitchen aspirin EC 81 MG tablet Take 81 mg by mouth daily.    Marland Kitchen augmented betamethasone dipropionate (DIPROLENE-AF) 0.05 % ointment Apply 1 application topically 2 (two) times daily as needed.    . Calcium Carbonate-Vitamin D 600-400 MG-UNIT per chew tablet Chew 1 tablet by mouth 2 (two) times daily.    . cholecalciferol (VITAMIN D) 1000 UNITS tablet Take 1,000 Units by mouth daily.     . clindamycin (CLEOCIN-T) 1 % external solution Apply topically 2 (two) times daily. 30 mL 0  . Coenzyme Q10 (COQ10) 100 MG CAPS Take 1 capsule by mouth daily.     . Cyanocobalamin (B-12) 1000 MCG CAPS Take 1,000 mcg by mouth 3 (three) times daily with meals.     Marland Kitchen doxycycline (VIBRAMYCIN) 100 MG capsule Take 100 mg by mouth 2 (two) times daily.    Marland Kitchen erlotinib (TARCEVA) 100 MG tablet Take 1 tablet (100 mg total) by mouth daily. Take on an empty stomach 1 hour before meals or 2 hours after 30 tablet  2  . loratadine (CLARITIN) 10 MG tablet Take 10 mg by mouth daily as needed for allergies.     Marland Kitchen LORazepam (ATIVAN) 0.5 MG tablet Take 1 tablet (0.5 mg total) by mouth every 8 (eight) hours as needed for anxiety. 30 tablet 0  . mirtazapine (REMERON) 15 MG tablet TAKE 1 TABLET BY MOUTH ONCE DAILY AT BEDTIME 30 tablet 2  . Polyethyl Glycol-Propyl Glycol (SYSTANE OP) Place 1 drop into both eyes at bedtime as needed (for dry eyes.).     Marland Kitchen psyllium (METAMUCIL) 58.6 % powder Take 1 packet by mouth daily as needed.    . Simethicone (GAS-X PO) Take 1 tablet by mouth 2 (two) times daily as needed (for gas).       . bismuth subsalicylate (PEPTO BISMOL) 262 MG/15ML suspension Take 30 mLs by mouth every three (3) days as needed.    . Hydrocodone-Acetaminophen (VICODIN) 5-300 MG TABS Take 1 tablet by mouth 4 (four) times daily as needed (for pain).     Marland Kitchen loperamide (IMODIUM A-D) 2 MG tablet Take 1 mg by mouth 4 (four) times daily as needed for diarrhea or loose stools.     Marland Kitchen oxyCODONE (OXY IR/ROXICODONE) 5 MG immediate release tablet Take 1 tablet (5 mg total) by mouth every 4 (four) hours as needed for moderate pain. (Patient not taking: Reported on 01/29/2017) 30 tablet 0   No current facility-administered medications for this visit.     SURGICAL HISTORY:  Past Surgical History:  Procedure Laterality Date  . ANAL FISSURE REPAIR  07/09/2011   INTERNAL SPHINCTEROTOMY  . BENIGN EXCISION LEFT BREAST CENTRAL DUCT  02/1999  . BREAST BIOPSY  01/31/2009   benign  . BREAST EXCISIONAL BIOPSY Left 2004   benign  . CHEST TUBE INSERTION Right 07/14/2014   Procedure: INSERTION PLEURAL DRAINAGE CATHETER RIGHT CHEST;  Surgeon: Grace Isaac, MD;  Location: [place];  Service: Thoracic;  Laterality: Right;  . EXCISION RIGHT WRIST MASS  2012  . KNEE ARTHROSCOPY Right 1999  . MASS EXCISION Right 03/11/2014   Procedure: RIGHT WRIST DEEP MASS EXCISION WITH CULTURE AND BIOSPY;  Surgeon: Linna Hoff, MD;  Location: [place];  Service: Orthopedics;  Laterality: Right;  . REMOVAL OF PLEURAL DRAINAGE CATHETER Right 10/14/2014   Procedure: REMOVAL OF PLEURAL DRAINAGE CATHETER;  Surgeon: Grace Isaac, MD;  Location: [place];  Service: Thoracic;  Laterality: Right;  . TALC PLEURODESIS Right 09/16/2014   Procedure: TALC PLEURADESIS/ slurry;  Surgeon: Grace Isaac, MD;  Location: [place];  Service: Thoracic;  Laterality: Right;  . THORACOSCOPY  01-26-2009   w/   lung/  node biopsy's  . THUMB ARTHROSCOPY Left    - removed bone spur  . TONSILLECTOMY  AS CHILD  . TOTAL ABDOMINAL HYSTERECTOMY W/ BILATERAL  SALPINGOOPHORECTOMY  1982   W/  APPENDECTOMY  . TRANSTHORACIC ECHOCARDIOGRAM  12-23-2008   NORMAL LV/  EF 65-70%/  MILD MR  &  TR  . VAULT SUSPENSION PLUS CYSTOCELE REPAIR WITH GRAFT  06-13-2010    REVIEW OF SYSTEMS:  A comprehensive review of systems was negative.   PHYSICAL EXAMINATION: General appearance: alert, cooperative and no distress Head: Normocephalic, without obvious abnormality, atraumatic Neck: no adenopathy, no JVD, supple, symmetrical, trachea midline and thyroid not enlarged, symmetric, no tenderness/mass/nodules Lymph nodes: Cervical, supraclavicular, and axillary nodes normal. Resp: clear to auscultation bilaterally Back: symmetric, no curvature. ROM normal. No CVA tenderness. Cardio: regular rate and rhythm, S1, S2 normal, no  murmur, click, rub or gallop GI: soft, non-tender; bowel sounds normal; no masses,  no organomegaly Extremities: extremities normal, atraumatic, no cyanosis or edema  ECOG PERFORMANCE STATUS: 1 - Symptomatic but completely ambulatory  Blood pressure 125/75, pulse 85, temperature 98.5 F (36.9 C), temperature source Oral, resp. rate 18, height '5\' 5"'$  (1.651 m), weight 115 lb 4.8 oz (52.3 kg), SpO2 100 %.  LABORATORY DATA: Lab Results  Component Value Date   WBC 5.7 08/29/2017   HGB 13.3 08/29/2017   HCT 39.5 08/29/2017   MCV 93.6 08/29/2017   PLT 256 08/29/2017      Chemistry      Component Value Date/Time   NA 142 08/29/2017 0936   K 4.0 08/29/2017 0936   CL 98 07/05/2016 1012   CL 108 (H) 06/03/2013 0939   CO2 28 08/29/2017 0936   BUN 21.8 08/29/2017 0936   CREATININE 0.9 08/29/2017 0936      Component Value Date/Time   CALCIUM 9.9 08/29/2017 0936   ALKPHOS 64 08/29/2017 0936   AST 27 08/29/2017 0936   ALT 18 08/29/2017 0936   BILITOT 0.99 08/29/2017 0936       RADIOGRAPHIC STUDIES: No results found.  ASSESSMENT AND PLAN:  This is a very pleasant 82 years old white female with stage IIIB non-small cell lung cancer,  adenocarcinoma with positive EGFR mutation diagnosed in January 2010. She has been on treatment with targeted therapy for close to 100 months including 54 months of Tarceva 100 mg by mouth daily. The patient continues to tolerate her treatment fairly well. I recommended for her to proceed with the same dose of Tarceva for now. I would see her back for follow-up visit in 6 weeks for evaluation after repeating CT scan of the chest for restaging of her disease. The patient was advised to call immediately if she has any concerning symptoms in the interval. The patient voices understanding of current disease status and treatment options and is in agreement with the current care plan. All questions were answered. The patient knows to call the clinic with any problems, questions or concerns. We can certainly see the patient much sooner if necessary. Disclaimer: This note was dictated with voice recognition software. Similar sounding words can inadvertently be transcribed and may not be corrected upon review.

## 2017-10-08 ENCOUNTER — Ambulatory Visit (HOSPITAL_COMMUNITY)
Admission: RE | Admit: 2017-10-08 | Discharge: 2017-10-08 | Disposition: A | Discharge status: Home / Self Care | Payer: Medicare Other | Source: Ambulatory Visit | Attending: Internal Medicine | Admitting: Internal Medicine

## 2017-10-08 ENCOUNTER — Other Ambulatory Visit: Payer: Medicare Other

## 2017-10-08 ENCOUNTER — Other Ambulatory Visit (HOSPITAL_BASED_OUTPATIENT_CLINIC_OR_DEPARTMENT_OTHER): Payer: Medicare Other

## 2017-10-08 DIAGNOSIS — C3491 Malignant neoplasm of unspecified part of right bronchus or lung: Secondary | ICD-10-CM

## 2017-10-08 DIAGNOSIS — I700 Atherosclerosis of aorta: Secondary | ICD-10-CM | POA: Diagnosis not present

## 2017-10-08 DIAGNOSIS — Z5181 Encounter for therapeutic drug level monitoring: Secondary | ICD-10-CM | POA: Diagnosis present

## 2017-10-08 DIAGNOSIS — R918 Other nonspecific abnormal finding of lung field: Secondary | ICD-10-CM | POA: Insufficient documentation

## 2017-10-08 DIAGNOSIS — C3490 Malignant neoplasm of unspecified part of unspecified bronchus or lung: Secondary | ICD-10-CM

## 2017-10-08 LAB — CBC WITH DIFFERENTIAL/PLATELET
BASO%: 0.5 % (ref 0.0–2.0)
Basophils Absolute: 0 10*3/uL (ref 0.0–0.1)
EOS%: 7.6 % — ABNORMAL HIGH (ref 0.0–7.0)
Eosinophils Absolute: 0.3 10*3/uL (ref 0.0–0.5)
HCT: 39 % (ref 34.8–46.6)
HGB: 12.9 g/dL (ref 11.6–15.9)
LYMPH%: 15.9 % (ref 14.0–49.7)
MCH: 31.2 pg (ref 25.1–34.0)
MCHC: 32.9 g/dL (ref 31.5–36.0)
MCV: 94.7 fL (ref 79.5–101.0)
MONO#: 0.4 10*3/uL (ref 0.1–0.9)
MONO%: 10.2 % (ref 0.0–14.0)
NEUT#: 2.7 10*3/uL (ref 1.5–6.5)
NEUT%: 65.8 % (ref 38.4–76.8)
PLATELETS: 250 10*3/uL (ref 145–400)
RBC: 4.12 10*6/uL (ref 3.70–5.45)
RDW: 14.4 % (ref 11.2–14.5)
WBC: 4.1 10*3/uL (ref 3.9–10.3)
lymph#: 0.7 10*3/uL — ABNORMAL LOW (ref 0.9–3.3)

## 2017-10-08 LAB — COMPREHENSIVE METABOLIC PANEL
ALT: 20 U/L (ref 0–55)
AST: 27 U/L (ref 5–34)
Albumin: 3.4 g/dL — ABNORMAL LOW (ref 3.5–5.0)
Alkaline Phosphatase: 58 U/L (ref 40–150)
Anion Gap: 9 mEq/L (ref 3–11)
BUN: 19.1 mg/dL (ref 7.0–26.0)
CHLORIDE: 109 meq/L (ref 98–109)
CO2: 26 meq/L (ref 22–29)
CREATININE: 0.9 mg/dL (ref 0.6–1.1)
Calcium: 9.1 mg/dL (ref 8.4–10.4)
Glucose: 84 mg/dl (ref 70–140)
Potassium: 3.9 mEq/L (ref 3.5–5.1)
Sodium: 144 mEq/L (ref 136–145)
Total Bilirubin: 0.79 mg/dL (ref 0.20–1.20)
Total Protein: 6.1 g/dL — ABNORMAL LOW (ref 6.4–8.3)

## 2017-10-08 MED ORDER — IOPAMIDOL (ISOVUE-300) INJECTION 61%
INTRAVENOUS | Status: AC
  Administered 2017-10-08: 75 mL
  Filled 2017-10-08: qty 75

## 2017-10-10 ENCOUNTER — Ambulatory Visit (HOSPITAL_BASED_OUTPATIENT_CLINIC_OR_DEPARTMENT_OTHER): Payer: Medicare Other | Admitting: Internal Medicine

## 2017-10-10 ENCOUNTER — Encounter: Payer: Self-pay | Admitting: Internal Medicine

## 2017-10-10 ENCOUNTER — Telehealth: Payer: Self-pay | Admitting: Internal Medicine

## 2017-10-10 VITALS — BP 148/81 | HR 85 | Temp 97.7°F | Resp 18 | Ht 65.0 in | Wt 117.8 lb

## 2017-10-10 DIAGNOSIS — C3491 Malignant neoplasm of unspecified part of right bronchus or lung: Secondary | ICD-10-CM

## 2017-10-10 DIAGNOSIS — C342 Malignant neoplasm of middle lobe, bronchus or lung: Secondary | ICD-10-CM

## 2017-10-10 DIAGNOSIS — I10 Essential (primary) hypertension: Secondary | ICD-10-CM | POA: Insufficient documentation

## 2017-10-10 DIAGNOSIS — Z5181 Encounter for therapeutic drug level monitoring: Secondary | ICD-10-CM

## 2017-10-10 DIAGNOSIS — R531 Weakness: Secondary | ICD-10-CM

## 2017-10-10 DIAGNOSIS — R5383 Other fatigue: Secondary | ICD-10-CM | POA: Diagnosis not present

## 2017-10-10 HISTORY — DX: Essential (primary) hypertension: I10

## 2017-10-10 NOTE — Telephone Encounter (Signed)
Gave avs and calendar for December  °

## 2017-10-10 NOTE — Progress Notes (Signed)
[place] [phone]) [phone]   [phone]  OFFICE PROGRESS NOTE  [name], MD [place] [address]  DIAGNOSIS: Stage IIIB (T2a, N3, M0) non-small cell lung cancer, adenocarcinoma with positive EGFR mutation diagnosed in January 2010.  PRIOR THERAPY: 1) Status post concurrent chemoradiation with weekly carboplatin and paclitaxel; last dose given March 21, 2009.  2) Tarceva at 150 mg p.o. daily, status post approximately 48 months of treatment, discontinued secondary to persistent diarrhea.  3) status post right Pleurx catheter placement for right nonmalignant pleural effusion.  CURRENT THERAPY: Tarceva 100 mg by mouth daily started 03/19/2013, status post 55 months of treatment.  INTERVAL HISTORY: Dana [name] 82 y.o. female returns to the clinic today for follow-up visit. The patient is feeling fine today with no specific complaints except for increasing fatigue. She does not exercise regularly as she used to do in the past. She denied having any current chest pain, shortness of breath, cough or hemoptysis. She denied having any fever or chills. She has no nausea, vomiting, diarrhea or constipation. She continues to tolerate her treatment with Tarceva fairly well. The patient had repeat CT scan of the chest performed recently and she is here for evaluation and discussion of her scan results and treatment options.   MEDICAL HISTORY: Past Medical History:  Diagnosis Date  . Arthritis    HANDS,  WRISTS  . COPD (chronic obstructive pulmonary disease) ([place])   . Depression 04/04/2017  . Dyspnea on exertion   . Encounter for therapeutic drug monitoring 10/01/2016  . GERD (gastroesophageal reflux disease)   . Hemorrhoid   . History of anal fissures   . History of lung cancer ONCOLOGIST--  DR [facility]--  LAST CT ,  NO RECURRENCE OR METS   DX JAN 2010 --  STAGE IIIA  NON-SMALL CELL ADENOCARCINOMA (RIGHT MIDDLE LOBE)---  S/P   CHEMORADIATIO THERAPY  (COMPLETE 03-21-2009)  . Imbalance 06/04/2017  . lung ca dx'd 2010  . Normal cardiac stress test    2007  PER PT  . Osteoporosis   . Rash, skin    RIGHT FOREARM/ HAND  . Right wrist pain    MASS  . Wears glasses     ALLERGIES:  is allergic to amoxicillin.  MEDICATIONS:  Current Outpatient Prescriptions  Medication Sig Dispense Refill  . alendronate (FOSAMAX) 70 MG tablet Take 70 mg by mouth once a week.    Marland Kitchen aspirin EC 81 MG tablet Take 81 mg by mouth daily.    Marland Kitchen augmented betamethasone dipropionate (DIPROLENE-AF) 0.05 % ointment Apply 1 application topically 2 (two) times daily as needed.    . bismuth subsalicylate (PEPTO BISMOL) 262 MG/15ML suspension Take 30 mLs by mouth every three (3) days as needed.    . Calcium Carbonate-Vitamin D 600-400 MG-UNIT per chew tablet Chew 1 tablet by mouth 2 (two) times daily.    . cholecalciferol (VITAMIN D) 1000 UNITS tablet Take 1,000 Units by mouth daily.     . clindamycin (CLEOCIN-T) 1 % external solution Apply topically 2 (two) times daily. 30 mL 0  . Coenzyme Q10 (COQ10) 100 MG CAPS Take 1 capsule by mouth daily.     . Cyanocobalamin (B-12) 1000 MCG CAPS Take 1,000 mcg by mouth 3 (three) times daily with meals.     . erlotinib (TARCEVA) 100 MG tablet Take 1 tablet (100 mg total) by mouth daily. Take on an empty stomach 1 hour before meals or 2  hours after 30 tablet 2  . loperamide (IMODIUM A-D) 2 MG tablet Take 1 mg by mouth 4 (four) times daily as needed for diarrhea or loose stools.     Marland Kitchen loratadine (CLARITIN) 10 MG tablet Take 10 mg by mouth daily as needed for allergies.     Marland Kitchen LORazepam (ATIVAN) 0.5 MG tablet Take 1 tablet (0.5 mg total) by mouth every 8 (eight) hours as needed for anxiety. 30 tablet 0  . mirtazapine (REMERON) 15 MG tablet TAKE 1 TABLET BY MOUTH ONCE DAILY AT BEDTIME 30 tablet 2  . Polyethyl Glycol-Propyl Glycol (SYSTANE OP) Place 1 drop into both eyes at bedtime as needed (for dry eyes.).     Marland Kitchen  psyllium (METAMUCIL) 58.6 % powder Take 1 packet by mouth daily as needed.    . Simethicone (GAS-X PO) Take 1 tablet by mouth 2 (two) times daily as needed (for gas).     . Hydrocodone-Acetaminophen (VICODIN) 5-300 MG TABS Take 1 tablet by mouth 4 (four) times daily as needed (for pain).     Marland Kitchen oxyCODONE (OXY IR/ROXICODONE) 5 MG immediate release tablet Take 1 tablet (5 mg total) by mouth every 4 (four) hours as needed for moderate pain. (Patient not taking: Reported on 01/29/2017) 30 tablet 0   No current facility-administered medications for this visit.     SURGICAL HISTORY:  Past Surgical History:  Procedure Laterality Date  . ANAL FISSURE REPAIR  07/09/2011   INTERNAL SPHINCTEROTOMY  . BENIGN EXCISION LEFT BREAST CENTRAL DUCT  02/1999  . BREAST BIOPSY  01/31/2009   benign  . BREAST EXCISIONAL BIOPSY Left 2004   benign  . CHEST TUBE INSERTION Right 07/14/2014   Procedure: INSERTION PLEURAL DRAINAGE CATHETER RIGHT CHEST;  Surgeon: Grace Isaac, MD;  Location: [place];  Service: Thoracic;  Laterality: Right;  . EXCISION RIGHT WRIST MASS  2012  . KNEE ARTHROSCOPY Right 1999  . MASS EXCISION Right 03/11/2014   Procedure: RIGHT WRIST DEEP MASS EXCISION WITH CULTURE AND BIOSPY;  Surgeon: Linna Hoff, MD;  Location: [place];  Service: Orthopedics;  Laterality: Right;  . REMOVAL OF PLEURAL DRAINAGE CATHETER Right 10/14/2014   Procedure: REMOVAL OF PLEURAL DRAINAGE CATHETER;  Surgeon: Grace Isaac, MD;  Location: [place];  Service: Thoracic;  Laterality: Right;  . TALC PLEURODESIS Right 09/16/2014   Procedure: TALC PLEURADESIS/ slurry;  Surgeon: Grace Isaac, MD;  Location: [place];  Service: Thoracic;  Laterality: Right;  . THORACOSCOPY  01-26-2009   w/   lung/  node biopsy's  . THUMB ARTHROSCOPY Left    - removed bone spur  . TONSILLECTOMY  AS CHILD  . TOTAL ABDOMINAL HYSTERECTOMY W/ BILATERAL SALPINGOOPHORECTOMY  1982   W/  APPENDECTOMY  . TRANSTHORACIC  ECHOCARDIOGRAM  12-23-2008   NORMAL LV/  EF 65-70%/  MILD MR  &  TR  . VAULT SUSPENSION PLUS CYSTOCELE REPAIR WITH GRAFT  06-13-2010    REVIEW OF SYSTEMS:  Constitutional: positive for fatigue Eyes: negative Ears, nose, mouth, throat, and face: negative Respiratory: negative Cardiovascular: negative Gastrointestinal: negative Genitourinary:negative Integument/breast: negative Hematologic/lymphatic: negative Musculoskeletal:negative Neurological: negative Behavioral/Psych: negative Endocrine: negative Allergic/Immunologic: negative   PHYSICAL EXAMINATION: General appearance: alert, cooperative, fatigued and no distress Head: Normocephalic, without obvious abnormality, atraumatic Neck: no adenopathy, no JVD, supple, symmetrical, trachea midline and thyroid not enlarged, symmetric, no tenderness/mass/nodules Lymph nodes: Cervical, supraclavicular, and axillary nodes normal. Resp: clear to auscultation bilaterally Back: symmetric, no curvature. ROM normal. No CVA tenderness. Cardio:  regular rate and rhythm, S1, S2 normal, no murmur, click, rub or gallop GI: soft, non-tender; bowel sounds normal; no masses,  no organomegaly Extremities: extremities normal, atraumatic, no cyanosis or edema Neurologic: Alert and oriented X 3, normal strength and tone. Normal symmetric reflexes. Normal coordination and gait  ECOG PERFORMANCE STATUS: 1 - Symptomatic but completely ambulatory  Blood pressure (!) 148/81, pulse 85, temperature 97.7 F (36.5 C), temperature source Oral, resp. rate 18, height '5\' 5"'$  (1.651 m), weight 117 lb 12.8 oz (53.4 kg), SpO2 98 %.  LABORATORY DATA: Lab Results  Component Value Date   WBC 4.1 10/08/2017   HGB 12.9 10/08/2017   HCT 39.0 10/08/2017   MCV 94.7 10/08/2017   PLT 250 10/08/2017      Chemistry      Component Value Date/Time   NA 144 10/08/2017 0959   K 3.9 10/08/2017 0959   CL 98 07/05/2016 1012   CL 108 (H) 06/03/2013 0939   CO2 26 10/08/2017  0959   BUN 19.1 10/08/2017 0959   CREATININE 0.9 10/08/2017 0959      Component Value Date/Time   CALCIUM 9.1 10/08/2017 0959   ALKPHOS 58 10/08/2017 0959   AST 27 10/08/2017 0959   ALT 20 10/08/2017 0959   BILITOT 0.79 10/08/2017 0959       RADIOGRAPHIC STUDIES: Ct Chest W Contrast  Result Date: 10/08/2017 CLINICAL DATA:  Stage IIIB right middle lobe lung adenocarcinoma diagnosed January 2010 status post concurrent chemoradiation therapy with ongoing Tarceva therapy. Restaging. EXAM: CT CHEST WITH CONTRAST TECHNIQUE: Multidetector CT imaging of the chest was performed during intravenous contrast administration. CONTRAST:  58m ISOVUE-300 IOPAMIDOL (ISOVUE-300) INJECTION 61% COMPARISON:  05/31/2017 chest CT. FINDINGS: Cardiovascular: Normal heart size. No significant pericardial fluid/thickening. Stable trace pericardial effusion/ thickening. Atherosclerotic nonaneurysmal thoracic aorta. Normal caliber pulmonary arteries. No central pulmonary emboli. Mediastinum/Nodes: No discrete thyroid nodules. Stable chronic mild circumferential wall thickening in the middle thoracic esophagus, probably treatment related. No pathologically enlarged axillary, mediastinal or hilar lymph nodes. Lungs/Pleura: No pneumothorax. Stable scattered plaque-like calcifications in the basilar right pleural space with associated smooth diffuse right pleural thickening, compatible with prior talc pleurodesis. No pleural effusions. Stable sharply marginated consolidation with associated volume loss, bronchiectasis and distortion in the parahilar right lung, compatible with radiation fibrosis. A few scattered tiny ground-glass pulmonary nodules in the right upper lobe measuring up to 5 mm (series 7/ image 36) are all stable. Subsolid 1.5 x 1.1 cm apical left upper lobe pulmonary nodule with 0.5 cm solid component (series 7/image 15), previously 1.5 x 1.1 cm with 0.5 cm solid component, stable. Stable 0.8 cm anterior left upper  lobe ground-glass pulmonary nodule (series 7/ image 52). No acute consolidative airspace disease or new significant pulmonary nodules. Upper abdomen: Unremarkable. Musculoskeletal: No aggressive appearing focal osseous lesions. Marked thoracic spondylosis. IMPRESSION: 1. Interval stability of subsolid 1.5 cm apical left upper lobe pulmonary nodule with 0.5 cm solid component. Indolent adenocarcinoma is not excluded. Continued chest CT surveillance advised. 2. Interval stability of scattered subcentimeter bilateral upper lobe ground-glass pulmonary nodules. 3. No evidence of new or progressive metastatic disease in the chest. 4. Stable posttreatment changes in the right hemithorax. Aortic Atherosclerosis (ICD10-I70.0). Electronically Signed   By: JIlona SorrelM.D.   On: 10/08/2017 15:23    ASSESSMENT AND PLAN:  This is a very pleasant 82years old white female with stage IIIB non-small cell lung cancer, adenocarcinoma with positive EGFR mutation diagnosed in January 2010. She has been  on treatment with targeted therapy for close to 100 months including 55 months of Tarceva 100 mg by mouth daily. The patient tolerated the last month of her treatment fairly well with no significant adverse effects. She denied having any skin rash or diarrhea. She had repeat CT scan of the chest performed recently. I personally and independently reviewed the scans and discussed the results with the patient today. Her scan showed no evidence for disease progression. I recommended for the patient to continue her current treatment with Tarceva for now. For the fatigue and weakness, I strongly recommended for the patient to resume her exercise regimen. For hypertension, she was advised to take her blood pressure medication as prescribed and to monitor it closely at home. I will see the patient back for follow-up visit in 2 months for reevaluation with repeat blood work. She was advised to call immediately if she has any  concerning symptoms in the interval. The patient voices understanding of current disease status and treatment options and is in agreement with the current care plan. All questions were answered. The patient knows to call the clinic with any problems, questions or concerns. We can certainly see the patient much sooner if necessary. Disclaimer: This note was dictated with voice recognition software. Similar sounding words can inadvertently be transcribed and may not be corrected upon review.

## 2017-10-14 ENCOUNTER — Other Ambulatory Visit: Payer: Self-pay | Admitting: Internal Medicine

## 2017-10-14 DIAGNOSIS — C3491 Malignant neoplasm of unspecified part of right bronchus or lung: Secondary | ICD-10-CM

## 2017-10-30 ENCOUNTER — Other Ambulatory Visit: Payer: Self-pay | Admitting: Medical Oncology

## 2017-10-30 DIAGNOSIS — C3491 Malignant neoplasm of unspecified part of right bronchus or lung: Secondary | ICD-10-CM

## 2017-10-30 MED ORDER — ERLOTINIB HCL 100 MG PO TABS: 100.0000 mg | ORAL_TABLET | Freq: Every day | ORAL | 0 refills | Status: DC

## 2017-12-04 ENCOUNTER — Other Ambulatory Visit: Payer: Self-pay | Admitting: Medical Oncology

## 2017-12-04 DIAGNOSIS — C3491 Malignant neoplasm of unspecified part of right bronchus or lung: Secondary | ICD-10-CM

## 2017-12-04 MED ORDER — ERLOTINIB HCL 100 MG PO TABS: 100.0000 mg | ORAL_TABLET | Freq: Every day | ORAL | 2 refills | Status: DC

## 2017-12-10 ENCOUNTER — Encounter: Payer: Self-pay | Admitting: Medical Oncology

## 2017-12-10 ENCOUNTER — Ambulatory Visit: Payer: Medicare Other

## 2017-12-10 ENCOUNTER — Other Ambulatory Visit: Payer: Medicare Other

## 2017-12-10 ENCOUNTER — Ambulatory Visit (HOSPITAL_BASED_OUTPATIENT_CLINIC_OR_DEPARTMENT_OTHER): Payer: Medicare Other | Admitting: Internal Medicine

## 2017-12-10 ENCOUNTER — Other Ambulatory Visit: Payer: Self-pay | Admitting: Medical Oncology

## 2017-12-10 ENCOUNTER — Telehealth: Payer: Self-pay | Admitting: Internal Medicine

## 2017-12-10 ENCOUNTER — Encounter: Payer: Self-pay | Admitting: Internal Medicine

## 2017-12-10 VITALS — BP 141/75 | HR 98 | Temp 98.6°F | Resp 20 | Ht 65.0 in | Wt 117.2 lb

## 2017-12-10 DIAGNOSIS — C342 Malignant neoplasm of middle lobe, bronchus or lung: Secondary | ICD-10-CM

## 2017-12-10 DIAGNOSIS — Z5181 Encounter for therapeutic drug level monitoring: Secondary | ICD-10-CM

## 2017-12-10 DIAGNOSIS — Z923 Personal history of irradiation: Secondary | ICD-10-CM

## 2017-12-10 DIAGNOSIS — I10 Essential (primary) hypertension: Secondary | ICD-10-CM

## 2017-12-10 DIAGNOSIS — Z9221 Personal history of antineoplastic chemotherapy: Secondary | ICD-10-CM

## 2017-12-10 DIAGNOSIS — C3491 Malignant neoplasm of unspecified part of right bronchus or lung: Secondary | ICD-10-CM

## 2017-12-10 LAB — COMPREHENSIVE METABOLIC PANEL
ALT: 17 U/L (ref 0–55)
ANION GAP: 9 meq/L (ref 3–11)
AST: 25 U/L (ref 5–34)
Albumin: 3.6 g/dL (ref 3.5–5.0)
Alkaline Phosphatase: 63 U/L (ref 40–150)
BUN: 20.4 mg/dL (ref 7.0–26.0)
CHLORIDE: 105 meq/L (ref 98–109)
CO2: 28 meq/L (ref 22–29)
Calcium: 9.7 mg/dL (ref 8.4–10.4)
Creatinine: 0.9 mg/dL (ref 0.6–1.1)
EGFR: >60 mL/min/{1.73_m2} (ref >60)
Glucose: 86 mg/dl (ref 70–140)
POTASSIUM: 4 meq/L (ref 3.5–5.1)
SODIUM: 142 meq/L (ref 136–145)
Total Bilirubin: 1.05 mg/dL (ref 0.20–1.20)
Total Protein: 6.4 g/dL (ref 6.4–8.3)

## 2017-12-10 LAB — CBC WITH DIFFERENTIAL/PLATELET
BASO%: 0.1 % (ref 0.0–2.0)
BASOS ABS: 0 10*3/uL (ref 0.0–0.1)
EOS%: 5.7 % (ref 0.0–7.0)
Eosinophils Absolute: 0.4 10*3/uL (ref 0.0–0.5)
HEMATOCRIT: 38.8 % (ref 34.8–46.6)
HEMOGLOBIN: 13.1 g/dL (ref 11.6–15.9)
LYMPH#: 0.9 10*3/uL (ref 0.9–3.3)
LYMPH%: 12.3 % — ABNORMAL LOW (ref 14.0–49.7)
MCH: 31.6 pg (ref 25.1–34.0)
MCHC: 33.8 g/dL (ref 31.5–36.0)
MCV: 93.7 fL (ref 79.5–101.0)
MONO#: 0.6 10*3/uL (ref 0.1–0.9)
MONO%: 7.8 % (ref 0.0–14.0)
NEUT#: 5.3 10*3/uL (ref 1.5–6.5)
NEUT%: 74.1 % (ref 38.4–76.8)
PLATELETS: 265 10*3/uL (ref 145–400)
RBC: 4.14 10*6/uL (ref 3.70–5.45)
RDW: 13.9 % (ref 11.2–14.5)
WBC: 7.1 10*3/uL (ref 3.9–10.3)

## 2017-12-10 LAB — RESEARCH LABS

## 2017-12-10 NOTE — Progress Notes (Signed)
[place] [phone]   [phone]) [phone]  OFFICE PROGRESS NOTE  [name], MD [place] [address]  DIAGNOSIS: Stage IIIB (T2a, N3, M0) non-small cell lung cancer, adenocarcinoma with positive EGFR mutation diagnosed in January 2010.  PRIOR THERAPY: 1) Status post concurrent chemoradiation with weekly carboplatin and paclitaxel; last dose given March 21, 2009.  2) Tarceva at 150 mg p.o. daily, status post approximately 48 months of treatment, discontinued secondary to persistent diarrhea.  3) status post right Pleurx catheter placement for right nonmalignant pleural effusion.  CURRENT THERAPY: Tarceva 100 mg by mouth daily started 03/19/2013, status post 57 months of treatment.  INTERVAL HISTORY: Dana [name] 82 y.o. female returns to the clinic today for follow-up visit.  The patient is feeling fine today with no specific complaints except for mild fatigue.  She still very active and does exercise at regular basis.  She denied having any chest pain, shortness of breath, cough or hemoptysis.  She denied having any fever or chills.  She has no nausea, vomiting, diarrhea or constipation.  She continues to tolerate her treatment with Tarceva fairly well.  She is here today for evaluation and repeat blood work.   MEDICAL HISTORY: Past Medical History:  Diagnosis Date  . Arthritis    HANDS,  WRISTS  . COPD (chronic obstructive pulmonary disease) ([place])   . Depression 04/04/2017  . Dyspnea on exertion   . Encounter for therapeutic drug monitoring 10/01/2016  . GERD (gastroesophageal reflux disease)   . Hemorrhoid   . History of anal fissures   . History of lung cancer ONCOLOGIST--  DR [facility]--  LAST CT ,  NO RECURRENCE OR METS   DX JAN 2010 --  STAGE IIIA  NON-SMALL CELL ADENOCARCINOMA (RIGHT MIDDLE LOBE)---  S/P  CHEMORADIATIO THERAPY  (COMPLETE 03-21-2009)  . HTN (hypertension) 10/10/2017  . Imbalance 06/04/2017  . lung ca  dx'd 2010  . Normal cardiac stress test    2007  PER PT  . Osteoporosis   . Rash, skin    RIGHT FOREARM/ HAND  . Right wrist pain    MASS  . Wears glasses     ALLERGIES:  is allergic to amoxicillin.  MEDICATIONS:  Current Outpatient Medications  Medication Sig Dispense Refill  . folic acid (FOLVITE) 1 MG tablet Take 1 mg by mouth.    . methotrexate (RHEUMATREX) 2.5 MG tablet Take 10 mg by mouth.    Marland Kitchen alendronate (FOSAMAX) 70 MG tablet Take 70 mg by mouth once a week.    Marland Kitchen aspirin EC 81 MG tablet Take 81 mg by mouth daily.    Marland Kitchen augmented betamethasone dipropionate (DIPROLENE-AF) 0.05 % ointment Apply 1 application topically 2 (two) times daily as needed.    . bismuth subsalicylate (PEPTO BISMOL) 262 MG/15ML suspension Take 30 mLs by mouth every three (3) days as needed.    . Calcium Carb-Cholecalciferol (CALCIUM-VITAMIN D) 500-200 MG-UNIT tablet Take by mouth.    . Calcium Carbonate-Vitamin D 600-400 MG-UNIT per chew tablet Chew 1 tablet by mouth 2 (two) times daily.    . cholecalciferol (VITAMIN D) 1000 UNITS tablet Take 1,000 Units by mouth daily.     . clindamycin (CLEOCIN-T) 1 % external solution Apply topically 2 (two) times daily. 30 mL 0  . Coenzyme Q10 (COQ10) 100 MG CAPS Take 1 capsule by mouth daily.     . Cyanocobalamin (B-12) 1000 MCG CAPS Take 1,000 mcg by mouth 3 (  three) times daily with meals.     . erlotinib (TARCEVA) 100 MG tablet Take 1 tablet (100 mg total) by mouth daily. Take on an empty stomach 1 hour before meals or 2 hours after 30 tablet 2  . Hydrocodone-Acetaminophen (VICODIN) 5-300 MG TABS Take 1 tablet by mouth 4 (four) times daily as needed (for pain).     . hydroxychloroquine (PLAQUENIL) 200 MG tablet Take by mouth.    . ibandronate (BONIVA) 150 MG tablet     . loperamide (IMODIUM A-D) 2 MG tablet Take 1 mg by mouth 4 (four) times daily as needed for diarrhea or loose stools.     Marland Kitchen loratadine (CLARITIN) 10 MG tablet Take 10 mg by mouth daily as needed for  allergies.     Marland Kitchen LORazepam (ATIVAN) 0.5 MG tablet Take 1 tablet (0.5 mg total) by mouth every 8 (eight) hours as needed for anxiety. 30 tablet 0  . mirtazapine (REMERON) 15 MG tablet TAKE 1 TABLET BY MOUTH ONCE DAILY AT BEDTIME 30 tablet 2  . oxyCODONE (OXY IR/ROXICODONE) 5 MG immediate release tablet Take 1 tablet (5 mg total) by mouth every 4 (four) hours as needed for moderate pain. (Patient not taking: Reported on 01/29/2017) 30 tablet 0  . Polyethyl Glycol-Propyl Glycol (SYSTANE OP) Place 1 drop into both eyes at bedtime as needed (for dry eyes.).     Marland Kitchen psyllium (METAMUCIL) 58.6 % powder Take 1 packet by mouth daily as needed.    . Simethicone (GAS-X PO) Take 1 tablet by mouth 2 (two) times daily as needed (for gas).     Marland Kitchen tiotropium (SPIRIVA) 18 MCG inhalation capsule Place into inhaler and inhale.     No current facility-administered medications for this visit.     SURGICAL HISTORY:  Past Surgical History:  Procedure Laterality Date  . ANAL FISSURE REPAIR  07/09/2011   INTERNAL SPHINCTEROTOMY  . BENIGN EXCISION LEFT BREAST CENTRAL DUCT  02/1999  . BREAST BIOPSY  01/31/2009   benign  . BREAST EXCISIONAL BIOPSY Left 2004   benign  . CHEST TUBE INSERTION Right 07/14/2014   Procedure: INSERTION PLEURAL DRAINAGE CATHETER RIGHT CHEST;  Surgeon: Grace Isaac, MD;  Location: [place];  Service: Thoracic;  Laterality: Right;  . EXCISION RIGHT WRIST MASS  2012  . KNEE ARTHROSCOPY Right 1999  . MASS EXCISION Right 03/11/2014   Procedure: RIGHT WRIST DEEP MASS EXCISION WITH CULTURE AND BIOSPY;  Surgeon: Linna Hoff, MD;  Location: [facility];  Service: Orthopedics;  Laterality: Right;  . REMOVAL OF PLEURAL DRAINAGE CATHETER Right 10/14/2014   Procedure: REMOVAL OF PLEURAL DRAINAGE CATHETER;  Surgeon: Grace Isaac, MD;  Location: [facility];  Service: Thoracic;  Laterality: Right;  . TALC PLEURODESIS Right 09/16/2014   Procedure: TALC PLEURADESIS/ slurry;  Surgeon: Grace Isaac, MD;  Location: [facility];  Service: Thoracic;  Laterality: Right;  . THORACOSCOPY  01-26-2009   w/   lung/  node biopsy's  . THUMB ARTHROSCOPY Left    - removed bone spur  . TONSILLECTOMY  AS CHILD  . TOTAL ABDOMINAL HYSTERECTOMY W/ BILATERAL SALPINGOOPHORECTOMY  1982   W/  APPENDECTOMY  . TRANSTHORACIC ECHOCARDIOGRAM  12-23-2008   NORMAL LV/  EF 65-70%/  MILD MR  &  TR  . VAULT SUSPENSION PLUS CYSTOCELE REPAIR WITH GRAFT  06-13-2010    REVIEW OF SYSTEMS:  A comprehensive review of systems was negative except for: Constitutional: positive for fatigue   PHYSICAL EXAMINATION: General  appearance: alert, cooperative, fatigued and no distress Head: Normocephalic, without obvious abnormality, atraumatic Neck: no adenopathy, no JVD, supple, symmetrical, trachea midline and thyroid not enlarged, symmetric, no tenderness/mass/nodules Lymph nodes: Cervical, supraclavicular, and axillary nodes normal. Resp: clear to auscultation bilaterally Back: symmetric, no curvature. ROM normal. No CVA tenderness. Cardio: regular rate and rhythm, S1, S2 normal, no murmur, click, rub or gallop GI: soft, non-tender; bowel sounds normal; no masses,  no organomegaly Extremities: extremities normal, atraumatic, no cyanosis or edema  ECOG PERFORMANCE STATUS: 1 - Symptomatic but completely ambulatory  Blood pressure (!) 141/75, pulse 98, temperature 98.6 F (37 C), temperature source Oral, resp. rate 20, height '5\' 5"'$  (1.651 m), weight 117 lb 3.2 oz (53.2 kg), SpO2 97 %.  LABORATORY DATA: Lab Results  Component Value Date   WBC 7.1 12/10/2017   HGB 13.1 12/10/2017   HCT 38.8 12/10/2017   MCV 93.7 12/10/2017   PLT 265 12/10/2017      Chemistry      Component Value Date/Time   NA 142 12/10/2017 1000   K 4.0 12/10/2017 1000   CL 98 07/05/2016 1012   CL 108 (H) 06/03/2013 0939   CO2 28 12/10/2017 1000   BUN 20.4 12/10/2017 1000   CREATININE 0.9 12/10/2017 1000      Component Value Date/Time    CALCIUM 9.7 12/10/2017 1000   ALKPHOS 63 12/10/2017 1000   AST 25 12/10/2017 1000   ALT 17 12/10/2017 1000   BILITOT 1.05 12/10/2017 1000       RADIOGRAPHIC STUDIES: No results found.  ASSESSMENT AND PLAN:  This is a very pleasant 82 years old white female with stage IIIB non-small cell lung cancer, adenocarcinoma with positive EGFR mutation diagnosed in January 2010. She has been on treatment with targeted therapy for close to 100 months including 57 months of Tarceva 100 mg by mouth daily. She continues to tolerate her treatment fairly well with no significant adverse effects. I recommended for the patient to continue her current treatment with Tarceva. I will see her back for follow-up visit in 2 months for evaluation with repeat blood work. She was advised to call immediately if she has any concerning symptoms in the interval. The patient voices understanding of current disease status and treatment options and is in agreement with the current care plan. All questions were answered. The patient knows to call the clinic with any problems, questions or concerns. We can certainly see the patient much sooner if necessary. Disclaimer: This note was dictated with voice recognition software. Similar sounding words can inadvertently be transcribed and may not be corrected upon review.

## 2017-12-10 NOTE — Telephone Encounter (Signed)
Scheduled appt per 12/18 los - Gave patient AVS and calender per los. Add lab appt. on for today for research.

## 2017-12-16 ENCOUNTER — Telehealth: Payer: Self-pay | Admitting: Pharmacy Technician

## 2017-12-16 NOTE — Telephone Encounter (Signed)
Oral Oncology Patient Advocate Encounter  Received a call that Dana [name] was running out of copay funding support for her Tarceva. Met patient in [place] to complete application for [name] in an effort to keep the patient's out of pocket expense for Tarceva to $0.    Application completed and faxed to [phone].   [employer] patient founcation phone number for follow up is [phone].   This encounter will be updated until final determination.   Fabio Asa. Melynda Keller, [place] Patient [facility] [phone] 12/16/2017 1:36 PM

## 2017-12-30 NOTE — Telephone Encounter (Signed)
Oral Oncology Patient Advocate Encounter  Received notification from [facility] Patient Assistance program that patient has been successfully enrolled into their program to receive Tarceva from the manufacturer at $0 out of pocket.   She will remain enrolled until therapy is discontinued.    I called and spoke with patient.  Celso Amy has already spoken with her and made arrangements for her initial shipment of medication.    Patient knows to call the office with any additional questions or concerns.  Oral Oncology Clinic will continue to follow.  Gilmore Laroche, CPhT, [place] Oral Oncology Patient Advocate [phone] 12/30/2017 9:14 AM

## 2018-01-30 ENCOUNTER — Other Ambulatory Visit: Payer: Self-pay | Admitting: Internal Medicine

## 2018-01-30 DIAGNOSIS — C3491 Malignant neoplasm of unspecified part of right bronchus or lung: Secondary | ICD-10-CM

## 2018-02-07 ENCOUNTER — Other Ambulatory Visit: Payer: Self-pay | Admitting: Oncology

## 2018-02-07 DIAGNOSIS — C3491 Malignant neoplasm of unspecified part of right bronchus or lung: Secondary | ICD-10-CM

## 2018-02-10 ENCOUNTER — Inpatient Hospital Stay: Payer: Medicare Other | Attending: Internal Medicine

## 2018-02-10 ENCOUNTER — Encounter: Payer: Self-pay | Admitting: Internal Medicine

## 2018-02-10 ENCOUNTER — Telehealth: Payer: Self-pay | Admitting: Internal Medicine

## 2018-02-10 ENCOUNTER — Inpatient Hospital Stay: Payer: Medicare Other | Admitting: Internal Medicine

## 2018-02-10 DIAGNOSIS — Z9221 Personal history of antineoplastic chemotherapy: Secondary | ICD-10-CM | POA: Insufficient documentation

## 2018-02-10 DIAGNOSIS — R0602 Shortness of breath: Secondary | ICD-10-CM

## 2018-02-10 DIAGNOSIS — C342 Malignant neoplasm of middle lobe, bronchus or lung: Secondary | ICD-10-CM

## 2018-02-10 DIAGNOSIS — C3490 Malignant neoplasm of unspecified part of unspecified bronchus or lung: Secondary | ICD-10-CM

## 2018-02-10 DIAGNOSIS — K649 Unspecified hemorrhoids: Secondary | ICD-10-CM | POA: Diagnosis not present

## 2018-02-10 DIAGNOSIS — K219 Gastro-esophageal reflux disease without esophagitis: Secondary | ICD-10-CM | POA: Diagnosis not present

## 2018-02-10 DIAGNOSIS — R5383 Other fatigue: Secondary | ICD-10-CM

## 2018-02-10 DIAGNOSIS — M129 Arthropathy, unspecified: Secondary | ICD-10-CM | POA: Diagnosis not present

## 2018-02-10 DIAGNOSIS — Z923 Personal history of irradiation: Secondary | ICD-10-CM | POA: Diagnosis not present

## 2018-02-10 DIAGNOSIS — I10 Essential (primary) hypertension: Secondary | ICD-10-CM

## 2018-02-10 DIAGNOSIS — M810 Age-related osteoporosis without current pathological fracture: Secondary | ICD-10-CM

## 2018-02-10 DIAGNOSIS — F329 Major depressive disorder, single episode, unspecified: Secondary | ICD-10-CM

## 2018-02-10 DIAGNOSIS — Z79899 Other long term (current) drug therapy: Secondary | ICD-10-CM | POA: Diagnosis not present

## 2018-02-10 DIAGNOSIS — J449 Chronic obstructive pulmonary disease, unspecified: Secondary | ICD-10-CM | POA: Diagnosis not present

## 2018-02-10 DIAGNOSIS — Z7982 Long term (current) use of aspirin: Secondary | ICD-10-CM

## 2018-02-10 DIAGNOSIS — C3491 Malignant neoplasm of unspecified part of right bronchus or lung: Secondary | ICD-10-CM

## 2018-02-10 LAB — CBC WITH DIFFERENTIAL (CANCER CENTER ONLY)
BASOS ABS: 0 10*3/uL (ref 0.0–0.1)
BASOS PCT: 0 %
Eosinophils Absolute: 0.3 10*3/uL (ref 0.0–0.5)
Eosinophils Relative: 6 %
HEMATOCRIT: 39 % (ref 34.8–46.6)
HEMOGLOBIN: 13 g/dL (ref 11.6–15.9)
Lymphocytes Relative: 13 %
Lymphs Abs: 0.7 10*3/uL — ABNORMAL LOW (ref 0.9–3.3)
MCH: 31.4 pg (ref 25.1–34.0)
MCHC: 33.3 g/dL (ref 31.5–36.0)
MCV: 94.2 fL (ref 79.5–101.0)
MONO ABS: 0.6 10*3/uL (ref 0.1–0.9)
Monocytes Relative: 9 %
NEUTROS ABS: 4.2 10*3/uL (ref 1.5–6.5)
NEUTROS PCT: 72 %
Platelet Count: 254 10*3/uL (ref 145–400)
RBC: 4.14 MIL/uL (ref 3.70–5.45)
RDW: 14.7 % — ABNORMAL HIGH (ref 11.2–14.5)
WBC Count: 5.9 10*3/uL (ref 3.9–10.3)

## 2018-02-10 LAB — CMP (CANCER CENTER ONLY)
ALBUMIN: 3.4 g/dL — AB (ref 3.5–5.0)
ALT: 19 U/L (ref 0–55)
AST: 25 U/L (ref 5–34)
Alkaline Phosphatase: 57 U/L (ref 40–150)
Anion gap: 9 (ref 3–11)
BILIRUBIN TOTAL: 1 mg/dL (ref 0.2–1.2)
BUN: 20 mg/dL (ref 7–26)
CO2: 28 mmol/L (ref 22–29)
Calcium: 9.8 mg/dL (ref 8.4–10.4)
Chloride: 108 mmol/L (ref 98–109)
Creatinine: 0.87 mg/dL (ref 0.60–1.10)
GFR, Est AFR Am: >60 mL/min (ref >60)
GFR, Estimated: >60 mL/min (ref >60)
GLUCOSE: 87 mg/dL (ref 70–140)
POTASSIUM: 4 mmol/L (ref 3.5–5.1)
Sodium: 145 mmol/L (ref 136–145)
TOTAL PROTEIN: 6.1 g/dL — AB (ref 6.4–8.3)

## 2018-02-10 NOTE — Progress Notes (Signed)
[place] [phone]   [phone]  OFFICE PROGRESS NOTE  [name], MD [place] [address]  DIAGNOSIS: Stage IIIB (T2a, N3, M0) non-small cell lung cancer, adenocarcinoma with positive EGFR mutation diagnosed in January 2010.  PRIOR THERAPY: 1) Status post concurrent chemoradiation with weekly carboplatin and paclitaxel; last dose given March 21, 2009.  2) Tarceva at 150 mg p.o. daily, status post approximately 48 months of treatment, discontinued secondary to persistent diarrhea.  3) status post right Pleurx catheter placement for right nonmalignant pleural effusion.  CURRENT THERAPY: Tarceva 100 mg by mouth daily started 03/19/2013, status post 59 months of treatment.  INTERVAL HISTORY: Dana [name] 83 y.o. female returns to the clinic today for follow-up visit.  The patient is feeling fine today with no specific complaints except for generalized fatigue.  She also has shortness of breath with exertion with no significant chest pain, cough or hemoptysis.  She denied having any recent fever or chills.  She has no nausea, vomiting, diarrhea or constipation.  She denied having any significant skin rash.  She continues to tolerate her treatment with Tarceva fairly well.  She is here today for evaluation and repeat blood work.   MEDICAL HISTORY: Past Medical History:  Diagnosis Date  . Arthritis    HANDS,  WRISTS  . COPD (chronic obstructive pulmonary disease) ([place])   . Depression 04/04/2017  . Dyspnea on exertion   . Encounter for therapeutic drug monitoring 10/01/2016  . GERD (gastroesophageal reflux disease)   . Hemorrhoid   . History of anal fissures   . History of lung cancer ONCOLOGIST--  DR [facility]--  LAST CT ,  NO RECURRENCE OR METS   DX JAN 2010 --  STAGE IIIA  NON-SMALL CELL ADENOCARCINOMA (RIGHT MIDDLE LOBE)---  S/P  CHEMORADIATIO THERAPY  (COMPLETE 03-21-2009)  . HTN (hypertension) 10/10/2017  .  Imbalance 06/04/2017  . lung ca dx'd 2010  . Normal cardiac stress test    2007  PER PT  . Osteoporosis   . Rash, skin    RIGHT FOREARM/ HAND  . Right wrist pain    MASS  . Wears glasses     ALLERGIES:  is allergic to amoxicillin.  MEDICATIONS:  Current Outpatient Medications  Medication Sig Dispense Refill  . alendronate (FOSAMAX) 70 MG tablet Take 70 mg by mouth once a week.    Marland Kitchen aspirin EC 81 MG tablet Take 81 mg by mouth daily.    Marland Kitchen augmented betamethasone dipropionate (DIPROLENE-AF) 0.05 % ointment Apply 1 application topically 2 (two) times daily as needed.    . bismuth subsalicylate (PEPTO BISMOL) 262 MG/15ML suspension Take 30 mLs by mouth every three (3) days as needed.    . Calcium Carb-Cholecalciferol (CALCIUM-VITAMIN D) 500-200 MG-UNIT tablet Take by mouth.    . Calcium Carbonate-Vitamin D 600-400 MG-UNIT per chew tablet Chew 1 tablet by mouth 2 (two) times daily.    . cholecalciferol (VITAMIN D) 1000 UNITS tablet Take 1,000 Units by mouth daily.     . clindamycin (CLEOCIN-T) 1 % external solution Apply topically 2 (two) times daily. 30 mL 0  . Coenzyme Q10 (COQ10) 100 MG CAPS Take 1 capsule by mouth daily.     . Cyanocobalamin (B-12) 1000 MCG CAPS Take 1,000 mcg by mouth 3 (three) times daily with meals.     . erlotinib (TARCEVA) 100 MG tablet Take 1 tablet (100 mg total) by mouth daily. Take on an  empty stomach 1 hour before meals or 2 hours after 30 tablet 2  . folic acid (FOLVITE) 1 MG tablet Take 1 mg by mouth.    . Hydrocodone-Acetaminophen (VICODIN) 5-300 MG TABS Take 1 tablet by mouth 4 (four) times daily as needed (for pain).     . hydroxychloroquine (PLAQUENIL) 200 MG tablet Take by mouth.    . ibandronate (BONIVA) 150 MG tablet     . loperamide (IMODIUM A-D) 2 MG tablet Take 1 mg by mouth 4 (four) times daily as needed for diarrhea or loose stools.     Marland Kitchen loratadine (CLARITIN) 10 MG tablet Take 10 mg by mouth daily as needed for allergies.     Marland Kitchen LORazepam  (ATIVAN) 0.5 MG tablet Take 1 tablet (0.5 mg total) by mouth every 8 (eight) hours as needed for anxiety. 30 tablet 0  . methotrexate (RHEUMATREX) 2.5 MG tablet Take 10 mg by mouth.    . mirtazapine (REMERON) 15 MG tablet TAKE 1 TABLET BY MOUTH ONCE DAILY AT BEDTIME 30 tablet 2  . oxyCODONE (OXY IR/ROXICODONE) 5 MG immediate release tablet Take 1 tablet (5 mg total) by mouth every 4 (four) hours as needed for moderate pain. (Patient not taking: Reported on 01/29/2017) 30 tablet 0  . Polyethyl Glycol-Propyl Glycol (SYSTANE OP) Place 1 drop into both eyes at bedtime as needed (for dry eyes.).     Marland Kitchen psyllium (METAMUCIL) 58.6 % powder Take 1 packet by mouth daily as needed.    . Simethicone (GAS-X PO) Take 1 tablet by mouth 2 (two) times daily as needed (for gas).     Marland Kitchen tiotropium (SPIRIVA) 18 MCG inhalation capsule Place into inhaler and inhale.     No current facility-administered medications for this visit.     SURGICAL HISTORY:  Past Surgical History:  Procedure Laterality Date  . ANAL FISSURE REPAIR  07/09/2011   INTERNAL SPHINCTEROTOMY  . BENIGN EXCISION LEFT BREAST CENTRAL DUCT  02/1999  . BREAST BIOPSY  01/31/2009   benign  . BREAST EXCISIONAL BIOPSY Left 2004   benign  . CHEST TUBE INSERTION Right 07/14/2014   Procedure: INSERTION PLEURAL DRAINAGE CATHETER RIGHT CHEST;  Surgeon: Grace Isaac, MD;  Location: [place];  Service: Thoracic;  Laterality: Right;  . EXCISION RIGHT WRIST MASS  2012  . KNEE ARTHROSCOPY Right 1999  . MASS EXCISION Right 03/11/2014   Procedure: RIGHT WRIST DEEP MASS EXCISION WITH CULTURE AND BIOSPY;  Surgeon: Linna Hoff, MD;  Location: [place];  Service: Orthopedics;  Laterality: Right;  . REMOVAL OF PLEURAL DRAINAGE CATHETER Right 10/14/2014   Procedure: REMOVAL OF PLEURAL DRAINAGE CATHETER;  Surgeon: Grace Isaac, MD;  Location: [place];  Service: Thoracic;  Laterality: Right;  . TALC PLEURODESIS Right 09/16/2014   Procedure: TALC  PLEURADESIS/ slurry;  Surgeon: Grace Isaac, MD;  Location: [place];  Service: Thoracic;  Laterality: Right;  . THORACOSCOPY  01-26-2009   w/   lung/  node biopsy's  . THUMB ARTHROSCOPY Left    - removed bone spur  . TONSILLECTOMY  AS CHILD  . TOTAL ABDOMINAL HYSTERECTOMY W/ BILATERAL SALPINGOOPHORECTOMY  1982   W/  APPENDECTOMY  . TRANSTHORACIC ECHOCARDIOGRAM  12-23-2008   NORMAL LV/  EF 65-70%/  MILD MR  &  TR  . VAULT SUSPENSION PLUS CYSTOCELE REPAIR WITH GRAFT  06-13-2010    REVIEW OF SYSTEMS:  A comprehensive review of systems was negative except for: Constitutional: positive for fatigue Respiratory: positive for  dyspnea on exertion   PHYSICAL EXAMINATION: General appearance: alert, cooperative, fatigued and no distress Head: Normocephalic, without obvious abnormality, atraumatic Neck: no adenopathy, no JVD, supple, symmetrical, trachea midline and thyroid not enlarged, symmetric, no tenderness/mass/nodules Lymph nodes: Cervical, supraclavicular, and axillary nodes normal. Resp: clear to auscultation bilaterally Back: symmetric, no curvature. ROM normal. No CVA tenderness. Cardio: regular rate and rhythm, S1, S2 normal, no murmur, click, rub or gallop GI: soft, non-tender; bowel sounds normal; no masses,  no organomegaly Extremities: extremities normal, atraumatic, no cyanosis or edema  ECOG PERFORMANCE STATUS: 1 - Symptomatic but completely ambulatory  Blood pressure 116/66, pulse 92, temperature (!) 97.4 F (36.3 C), temperature source Oral, resp. rate 20, height _0  (1.651 m), weight 117 lb 1.6 oz (53.1 kg), SpO2 99 %.  LABORATORY DATA: Lab Results  Component Value Date   WBC 5.9 02/10/2018   HGB 13.1 12/10/2017   HCT 39.0 02/10/2018   MCV 94.2 02/10/2018   PLT 254 02/10/2018      Chemistry      Component Value Date/Time   NA 145 02/10/2018 0940   NA 142 12/10/2017 1000   K 4.0 02/10/2018 0940   K 4.0 12/10/2017 1000   CL 108 02/10/2018 0940   CL 108 (H)  06/03/2013 0939   CO2 28 02/10/2018 0940   CO2 28 12/10/2017 1000   BUN 20 02/10/2018 0940   BUN 20.4 12/10/2017 1000   CREATININE 0.87 02/10/2018 0940   CREATININE 0.9 12/10/2017 1000      Component Value Date/Time   CALCIUM 9.8 02/10/2018 0940   CALCIUM 9.7 12/10/2017 1000   ALKPHOS 57 02/10/2018 0940   ALKPHOS 63 12/10/2017 1000   AST 25 02/10/2018 0940   AST 25 12/10/2017 1000   ALT 19 02/10/2018 0940   ALT 17 12/10/2017 1000   BILITOT 1.0 02/10/2018 0940   BILITOT 1.05 12/10/2017 1000       RADIOGRAPHIC STUDIES: No results found.  ASSESSMENT AND PLAN:  This is a very pleasant 83 years old white female with stage IIIB non-small cell lung cancer, adenocarcinoma with positive EGFR mutation diagnosed in January 2010. The patient is feeling fine today with no specific complaints except for fatigue and shortness of breath with exertion. Her lab work and physical exam is unremarkable today. I recommended for the patient to continue her current treatment with Tarceva with the same dose. I will see the patient back for follow-up visit in 1 month for evaluation after repeating CT scan of the chest for restaging of her disease. She was advised to call immediately if she has any concerning symptoms in the interval. The patient voices understanding of current disease status and treatment options and is in agreement with the current care plan. All questions were answered. The patient knows to call the clinic with any problems, questions or concerns. We can certainly see the patient much sooner if necessary. Disclaimer: This note was dictated with voice recognition software. Similar sounding words can inadvertently be transcribed and may not be corrected upon review.

## 2018-02-10 NOTE — Telephone Encounter (Signed)
Scheduled appt per 2/18 los - Gave patient AVS and calender per los. - Central radiology to contact patient with ct.

## 2018-03-07 ENCOUNTER — Inpatient Hospital Stay: Payer: Medicare Other | Attending: Internal Medicine

## 2018-03-07 ENCOUNTER — Encounter (HOSPITAL_COMMUNITY): Payer: Self-pay

## 2018-03-07 ENCOUNTER — Ambulatory Visit (HOSPITAL_COMMUNITY)
Admission: RE | Admit: 2018-03-07 | Discharge: 2018-03-07 | Disposition: A | Discharge status: Home / Self Care | Payer: Medicare Other | Source: Ambulatory Visit | Attending: Internal Medicine | Admitting: Internal Medicine

## 2018-03-07 DIAGNOSIS — J449 Chronic obstructive pulmonary disease, unspecified: Secondary | ICD-10-CM | POA: Diagnosis not present

## 2018-03-07 DIAGNOSIS — Z9221 Personal history of antineoplastic chemotherapy: Secondary | ICD-10-CM | POA: Diagnosis not present

## 2018-03-07 DIAGNOSIS — M129 Arthropathy, unspecified: Secondary | ICD-10-CM | POA: Insufficient documentation

## 2018-03-07 DIAGNOSIS — K219 Gastro-esophageal reflux disease without esophagitis: Secondary | ICD-10-CM | POA: Insufficient documentation

## 2018-03-07 DIAGNOSIS — I10 Essential (primary) hypertension: Secondary | ICD-10-CM | POA: Diagnosis not present

## 2018-03-07 DIAGNOSIS — M810 Age-related osteoporosis without current pathological fracture: Secondary | ICD-10-CM | POA: Insufficient documentation

## 2018-03-07 DIAGNOSIS — Z7982 Long term (current) use of aspirin: Secondary | ICD-10-CM | POA: Diagnosis not present

## 2018-03-07 DIAGNOSIS — I700 Atherosclerosis of aorta: Secondary | ICD-10-CM | POA: Diagnosis not present

## 2018-03-07 DIAGNOSIS — C3490 Malignant neoplasm of unspecified part of unspecified bronchus or lung: Secondary | ICD-10-CM | POA: Diagnosis not present

## 2018-03-07 DIAGNOSIS — R5383 Other fatigue: Secondary | ICD-10-CM | POA: Diagnosis not present

## 2018-03-07 DIAGNOSIS — C342 Malignant neoplasm of middle lobe, bronchus or lung: Secondary | ICD-10-CM | POA: Diagnosis not present

## 2018-03-07 DIAGNOSIS — Z923 Personal history of irradiation: Secondary | ICD-10-CM | POA: Diagnosis not present

## 2018-03-07 DIAGNOSIS — Z79899 Other long term (current) drug therapy: Secondary | ICD-10-CM | POA: Diagnosis not present

## 2018-03-07 LAB — CBC WITH DIFFERENTIAL (CANCER CENTER ONLY)
BASOS ABS: 0 10*3/uL (ref 0.0–0.1)
BASOS PCT: 1 %
EOS PCT: 6 %
Eosinophils Absolute: 0.3 10*3/uL (ref 0.0–0.5)
HEMATOCRIT: 38.9 % (ref 34.8–46.6)
Hemoglobin: 12.9 g/dL (ref 11.6–15.9)
LYMPHS PCT: 13 %
Lymphs Abs: 0.6 10*3/uL — ABNORMAL LOW (ref 0.9–3.3)
MCH: 31.2 pg (ref 25.1–34.0)
MCHC: 33.1 g/dL (ref 31.5–36.0)
MCV: 94 fL (ref 79.5–101.0)
MONO ABS: 0.4 10*3/uL (ref 0.1–0.9)
MONOS PCT: 8 %
NEUTROS ABS: 3.5 10*3/uL (ref 1.5–6.5)
Neutrophils Relative %: 72 %
PLATELETS: 253 10*3/uL (ref 145–400)
RBC: 4.14 MIL/uL (ref 3.70–5.45)
RDW: 14.2 % (ref 11.2–14.5)
WBC Count: 4.9 10*3/uL (ref 3.9–10.3)

## 2018-03-07 LAB — CMP (CANCER CENTER ONLY)
ALT: 24 U/L (ref 0–55)
ANION GAP: 8 (ref 3–11)
AST: 29 U/L (ref 5–34)
Albumin: 3.5 g/dL (ref 3.5–5.0)
Alkaline Phosphatase: 62 U/L (ref 40–150)
BILIRUBIN TOTAL: 1.1 mg/dL (ref 0.2–1.2)
BUN: 20 mg/dL (ref 7–26)
CHLORIDE: 108 mmol/L (ref 98–109)
CO2: 26 mmol/L (ref 22–29)
Calcium: 9.2 mg/dL (ref 8.4–10.4)
Creatinine: 0.86 mg/dL (ref 0.60–1.10)
GFR, Est AFR Am: >60 mL/min (ref >60)
Glucose, Bld: 85 mg/dL (ref 70–140)
POTASSIUM: 3.9 mmol/L (ref 3.5–5.1)
Sodium: 142 mmol/L (ref 136–145)
TOTAL PROTEIN: 6.1 g/dL — AB (ref 6.4–8.3)

## 2018-03-07 MED ORDER — IOPAMIDOL (ISOVUE-300) INJECTION 61%
INTRAVENOUS | Status: AC
  Administered 2018-03-07: 75 mL via INTRAVENOUS
  Filled 2018-03-07: qty 75

## 2018-03-07 MED ORDER — IOPAMIDOL (ISOVUE-300) INJECTION 61%
75.0000 mL | Freq: Once | INTRAVENOUS | Status: AC | PRN
  Administered 2018-03-07: 75 mL via INTRAVENOUS

## 2018-03-11 ENCOUNTER — Encounter: Payer: Self-pay | Admitting: Internal Medicine

## 2018-03-11 ENCOUNTER — Inpatient Hospital Stay: Payer: Medicare Other | Admitting: Internal Medicine

## 2018-03-11 ENCOUNTER — Telehealth: Payer: Self-pay | Admitting: Internal Medicine

## 2018-03-11 VITALS — BP 138/79 | HR 87 | Temp 97.8°F | Resp 16 | Ht 65.0 in | Wt 117.5 lb

## 2018-03-11 DIAGNOSIS — C3491 Malignant neoplasm of unspecified part of right bronchus or lung: Secondary | ICD-10-CM

## 2018-03-11 DIAGNOSIS — C342 Malignant neoplasm of middle lobe, bronchus or lung: Secondary | ICD-10-CM | POA: Diagnosis not present

## 2018-03-11 DIAGNOSIS — C3412 Malignant neoplasm of upper lobe, left bronchus or lung: Secondary | ICD-10-CM | POA: Diagnosis not present

## 2018-03-11 NOTE — Telephone Encounter (Signed)
Scheduled appt per 3/19 los - Gave patient AVS and calender per los.

## 2018-03-11 NOTE — Progress Notes (Signed)
[place] [phone]   [phone]  OFFICE PROGRESS NOTE  [name], MD [place] [address]  DIAGNOSIS: Stage IIIB (T2a, N3, M0) non-small cell lung cancer, adenocarcinoma with positive EGFR mutation diagnosed in January 2010.  PRIOR THERAPY: 1) Status post concurrent chemoradiation with weekly carboplatin and paclitaxel; last dose given March 21, 2009.  2) Tarceva at 150 mg p.o. daily, status post approximately 48 months of treatment, discontinued secondary to persistent diarrhea.  3) status post right Pleurx catheter placement for right nonmalignant pleural effusion.  CURRENT THERAPY: Tarceva 100 mg by mouth daily started 03/19/2013, status post 60 months of treatment.  INTERVAL HISTORY: Dana [name] 83 y.o. female returns to the clinic today for follow-up visit.  The patient is feeling fine today with no specific complaints except for mild fatigue.  She has no chest pain, shortness of breath, cough or hemoptysis.  She denied having any recent weight loss or night sweats.  She has no nausea, vomiting, diarrhea or constipation.  She denied having any concerning skin rash.  She continues to tolerate her treatment with Tarceva fairly well.  The patient had a repeat CT scan of the chest performed recently and she is here for evaluation and discussion of her discuss results.   MEDICAL HISTORY: Past Medical History:  Diagnosis Date  . Arthritis    HANDS,  WRISTS  . COPD (chronic obstructive pulmonary disease) ([place])   . Depression 04/04/2017  . Dyspnea on exertion   . Encounter for therapeutic drug monitoring 10/01/2016  . GERD (gastroesophageal reflux disease)   . Hemorrhoid   . History of anal fissures   . History of lung cancer ONCOLOGIST--  DR [facility]--  LAST CT ,  NO RECURRENCE OR METS   DX JAN 2010 --  STAGE IIIA  NON-SMALL CELL ADENOCARCINOMA (RIGHT MIDDLE LOBE)---  S/P  CHEMORADIATIO THERAPY  (COMPLETE  03-21-2009)  . HTN (hypertension) 10/10/2017  . Imbalance 06/04/2017  . lung ca dx'd 2010  . Normal cardiac stress test    2007  PER PT  . Osteoporosis   . Rash, skin    RIGHT FOREARM/ HAND  . Right wrist pain    MASS  . Wears glasses     ALLERGIES:  is allergic to amoxicillin.  MEDICATIONS:  Current Outpatient Medications  Medication Sig Dispense Refill  . alendronate (FOSAMAX) 70 MG tablet Take 70 mg by mouth once a week.    Marland Kitchen aspirin EC 81 MG tablet Take 81 mg by mouth daily.    Marland Kitchen augmented betamethasone dipropionate (DIPROLENE-AF) 0.05 % ointment Apply 1 application topically 2 (two) times daily as needed.    . bismuth subsalicylate (PEPTO BISMOL) 262 MG/15ML suspension Take 30 mLs by mouth every three (3) days as needed.    . Calcium Carb-Cholecalciferol (CALCIUM-VITAMIN D) 500-200 MG-UNIT tablet Take by mouth.    . Calcium Carbonate-Vitamin D 600-400 MG-UNIT per chew tablet Chew 1 tablet by mouth 2 (two) times daily.    . cholecalciferol (VITAMIN D) 1000 UNITS tablet Take 1,000 Units by mouth daily.     . clindamycin (CLEOCIN-T) 1 % external solution Apply topically 2 (two) times daily. 30 mL 0  . Coenzyme Q10 (COQ10) 100 MG CAPS Take 1 capsule by mouth daily.     . Cyanocobalamin (B-12) 1000 MCG CAPS Take 1,000 mcg by mouth 3 (three) times daily with meals.     . erlotinib (TARCEVA) 100 MG tablet Take  1 tablet (100 mg total) by mouth daily. Take on an empty stomach 1 hour before meals or 2 hours after 30 tablet 2  . folic acid (FOLVITE) 1 MG tablet Take 1 mg by mouth.    . Hydrocodone-Acetaminophen (VICODIN) 5-300 MG TABS Take 1 tablet by mouth 4 (four) times daily as needed (for pain).     . hydroxychloroquine (PLAQUENIL) 200 MG tablet Take by mouth.    . ibandronate (BONIVA) 150 MG tablet     . loperamide (IMODIUM A-D) 2 MG tablet Take 1 mg by mouth 4 (four) times daily as needed for diarrhea or loose stools.     Marland Kitchen loratadine (CLARITIN) 10 MG tablet Take 10 mg by mouth  daily as needed for allergies.     Marland Kitchen LORazepam (ATIVAN) 0.5 MG tablet Take 1 tablet (0.5 mg total) by mouth every 8 (eight) hours as needed for anxiety. 30 tablet 0  . methotrexate (RHEUMATREX) 2.5 MG tablet Take 10 mg by mouth.    . mirtazapine (REMERON) 15 MG tablet TAKE 1 TABLET BY MOUTH ONCE DAILY AT BEDTIME 30 tablet 2  . oxyCODONE (OXY IR/ROXICODONE) 5 MG immediate release tablet Take 1 tablet (5 mg total) by mouth every 4 (four) hours as needed for moderate pain. (Patient not taking: Reported on 01/29/2017) 30 tablet 0  . Polyethyl Glycol-Propyl Glycol (SYSTANE OP) Place 1 drop into both eyes at bedtime as needed (for dry eyes.).     Marland Kitchen psyllium (METAMUCIL) 58.6 % powder Take 1 packet by mouth daily as needed.    . Simethicone (GAS-X PO) Take 1 tablet by mouth 2 (two) times daily as needed (for gas).     Marland Kitchen tiotropium (SPIRIVA) 18 MCG inhalation capsule Place into inhaler and inhale.     No current facility-administered medications for this visit.     SURGICAL HISTORY:  Past Surgical History:  Procedure Laterality Date  . ANAL FISSURE REPAIR  07/09/2011   INTERNAL SPHINCTEROTOMY  . BENIGN EXCISION LEFT BREAST CENTRAL DUCT  02/1999  . BREAST BIOPSY  01/31/2009   benign  . BREAST EXCISIONAL BIOPSY Left 2004   benign  . CHEST TUBE INSERTION Right 07/14/2014   Procedure: INSERTION PLEURAL DRAINAGE CATHETER RIGHT CHEST;  Surgeon: Grace Isaac, MD;  Location: [place];  Service: Thoracic;  Laterality: Right;  . EXCISION RIGHT WRIST MASS  2012  . KNEE ARTHROSCOPY Right 1999  . MASS EXCISION Right 03/11/2014   Procedure: RIGHT WRIST DEEP MASS EXCISION WITH CULTURE AND BIOSPY;  Surgeon: Linna Hoff, MD;  Location: [place];  Service: Orthopedics;  Laterality: Right;  . REMOVAL OF PLEURAL DRAINAGE CATHETER Right 10/14/2014   Procedure: REMOVAL OF PLEURAL DRAINAGE CATHETER;  Surgeon: Grace Isaac, MD;  Location: [place];  Service: Thoracic;  Laterality: Right;  . TALC  PLEURODESIS Right 09/16/2014   Procedure: TALC PLEURADESIS/ slurry;  Surgeon: Grace Isaac, MD;  Location: [place];  Service: Thoracic;  Laterality: Right;  . THORACOSCOPY  01-26-2009   w/   lung/  node biopsy's  . THUMB ARTHROSCOPY Left    - removed bone spur  . TONSILLECTOMY  AS CHILD  . TOTAL ABDOMINAL HYSTERECTOMY W/ BILATERAL SALPINGOOPHORECTOMY  1982   W/  APPENDECTOMY  . TRANSTHORACIC ECHOCARDIOGRAM  12-23-2008   NORMAL LV/  EF 65-70%/  MILD MR  &  TR  . VAULT SUSPENSION PLUS CYSTOCELE REPAIR WITH GRAFT  06-13-2010    REVIEW OF SYSTEMS:  Constitutional: negative Eyes: negative Ears,  nose, mouth, throat, and face: negative Respiratory: negative Cardiovascular: negative Gastrointestinal: negative Genitourinary:negative Integument/breast: negative Hematologic/lymphatic: negative Musculoskeletal:negative Neurological: negative Behavioral/Psych: negative Endocrine: negative Allergic/Immunologic: negative   PHYSICAL EXAMINATION: General appearance: alert, cooperative and no distress Head: Normocephalic, without obvious abnormality, atraumatic Neck: no adenopathy, no JVD, supple, symmetrical, trachea midline and thyroid not enlarged, symmetric, no tenderness/mass/nodules Lymph nodes: Cervical, supraclavicular, and axillary nodes normal. Resp: clear to auscultation bilaterally Back: symmetric, no curvature. ROM normal. No CVA tenderness. Cardio: regular rate and rhythm, S1, S2 normal, no murmur, click, rub or gallop GI: soft, non-tender; bowel sounds normal; no masses,  no organomegaly Extremities: extremities normal, atraumatic, no cyanosis or edema Neurologic: Alert and oriented X 3, normal strength and tone. Normal symmetric reflexes. Normal coordination and gait  ECOG PERFORMANCE STATUS: 1 - Symptomatic but completely ambulatory  Blood pressure 138/79, pulse 87, temperature 97.8 F (36.6 C), temperature source Oral, resp. rate 16, height '5\' 5"'$  (1.651 m), weight 117 lb  8 oz (53.3 kg), SpO2 100 %.  LABORATORY DATA: Lab Results  Component Value Date   WBC 4.9 03/07/2018   HGB 13.1 12/10/2017   HCT 38.9 03/07/2018   MCV 94.0 03/07/2018   PLT 253 03/07/2018      Chemistry      Component Value Date/Time   NA 142 03/07/2018 0931   NA 142 12/10/2017 1000   K 3.9 03/07/2018 0931   K 4.0 12/10/2017 1000   CL 108 03/07/2018 0931   CL 108 (H) 06/03/2013 0939   CO2 26 03/07/2018 0931   CO2 28 12/10/2017 1000   BUN 20 03/07/2018 0931   BUN 20.4 12/10/2017 1000   CREATININE 0.86 03/07/2018 0931   CREATININE 0.9 12/10/2017 1000      Component Value Date/Time   CALCIUM 9.2 03/07/2018 0931   CALCIUM 9.7 12/10/2017 1000   ALKPHOS 62 03/07/2018 0931   ALKPHOS 63 12/10/2017 1000   AST 29 03/07/2018 0931   AST 25 12/10/2017 1000   ALT 24 03/07/2018 0931   ALT 17 12/10/2017 1000   BILITOT 1.1 03/07/2018 0931   BILITOT 1.05 12/10/2017 1000       RADIOGRAPHIC STUDIES: Ct Chest W Contrast  Result Date: 03/07/2018 CLINICAL DATA:  Lung cancer.  Follow-up exam. EXAM: CT CHEST WITH CONTRAST TECHNIQUE: Multidetector CT imaging of the chest was performed during intravenous contrast administration. CONTRAST:  75 cc Isovue-300 COMPARISON:  10/08/2017 FINDINGS: Cardiovascular: Normal heart size. No pericardial effusion. Aortic atherosclerosis. Calcification in the LAD and left circumflex and RCA coronary artery noted. Mediastinum/Nodes: The trachea appears patent and is midline. Mild circumferential wall thickening involving the distal esophagus noted. Thyroid gland appears normal. No enlarged mediastinal or hilar lymph nodes. Lungs/Pleura: Pleural thickening and calcification within the basilar right pleural space is again noted compatible with prior talc pleurodesis. No recurrent effusion. Architectural distortion, volume loss and fibrosis within the paramediastinal right lung is again noted compatible with changes due to external beam radiation. The part solid  nodule within the left upper lobe is again noted. This measures 1.5 cm and has a 5 mm internal solid component, image 15/series 2. Unchanged from previous exam. Anterior left upper lobe pulmonary nodule measures 9 mm and is unchanged from previous exam, image 50/series 7. Small peripheral nodule in the right upper lobe measures 4 mm and is unchanged from previous exam, image 36/7. No new or enlarging pulmonary nodules. Upper Abdomen: No acute abnormality. Musculoskeletal: Scoliosis and degenerative disc disease identified. No suspicious bone lesions. IMPRESSION: 1.  Stable CT of the chest. 2. Stable part solid nodule within the left upper lobe measuring 1.5 cm with a 5 mm solid component. Other small nodules are stable. 3. No evidence of new or progressive metastatic disease in the chest. 4.  Aortic Atherosclerosis (ICD10-I70.0). Electronically Signed   By: Kerby Moors M.D.   On: 03/07/2018 15:38    ASSESSMENT AND PLAN:  This is a very pleasant 83 years old white female with stage IIIB non-small cell lung cancer, adenocarcinoma with positive EGFR mutation diagnosed in January 2010. She has been on treatment with Tarceva for almost 9 years.  The patient has been tolerating this treatment fairly well with no concerning adverse effects. She had repeat CT scan of the chest performed recently.  I personally and independently reviewed the HIDA scan images and discussed the results with the patient today. Had a scan showed no concerning findings for disease progression.  I discussed with the patient the option of giving her break off treatment and close monitoring versus continuation of her treatment with Tarceva.  The patient mentioned that she does not have a lot of adverse effects from the treatment and she would like to continue her current treatment with Tarceva with the same dose. I will see her back for follow-up visit in 2 months for evaluation with repeat CBC and comprehensive metabolic panel. She was  advised to call immediately if she has any concerning symptoms in the interval. The patient voices understanding of current disease status and treatment options and is in agreement with the current care plan. All questions were answered. The patient knows to call the clinic with any problems, questions or concerns. We can certainly see the patient much sooner if necessary. Disclaimer: This note was dictated with voice recognition software. Similar sounding words can inadvertently be transcribed and may not be corrected upon review.

## 2018-04-03 ENCOUNTER — Other Ambulatory Visit: Payer: Self-pay | Admitting: *Deleted

## 2018-04-03 DIAGNOSIS — C3491 Malignant neoplasm of unspecified part of right bronchus or lung: Secondary | ICD-10-CM

## 2018-04-03 MED ORDER — ERLOTINIB HCL 100 MG PO TABS: 100.0000 mg | ORAL_TABLET | Freq: Every day | ORAL | 2 refills | Status: DC

## 2018-04-03 NOTE — Telephone Encounter (Signed)
Pt called requesting refill on Tarceva.  Last seen 3/19-MD progress note - pt to continue on Tarceva 100mg  daily.  Rx for Tarceva 100mg  sent to pt's pharmacy.

## 2018-04-04 ENCOUNTER — Other Ambulatory Visit: Payer: Self-pay | Admitting: *Deleted

## 2018-04-04 DIAGNOSIS — C3491 Malignant neoplasm of unspecified part of right bronchus or lung: Secondary | ICD-10-CM

## 2018-04-04 MED ORDER — ERLOTINIB HCL 100 MG PO TABS: 100.0000 mg | ORAL_TABLET | Freq: Every day | ORAL | 2 refills | Status: DC

## 2018-04-04 NOTE — Telephone Encounter (Signed)
Pt called with refill request on arceva. Refills are though [facility].  New rx refill sent, pt notified.

## 2018-04-07 ENCOUNTER — Other Ambulatory Visit: Payer: Self-pay | Admitting: Medical Oncology

## 2018-04-07 DIAGNOSIS — C3491 Malignant neoplasm of unspecified part of right bronchus or lung: Secondary | ICD-10-CM

## 2018-04-07 MED ORDER — ERLOTINIB HCL 100 MG PO TABS: 100.0000 mg | ORAL_TABLET | Freq: Every day | ORAL | 2 refills | Status: DC

## 2018-04-21 ENCOUNTER — Other Ambulatory Visit: Payer: Self-pay | Admitting: Family Medicine

## 2018-04-21 DIAGNOSIS — Z1231 Encounter for screening mammogram for malignant neoplasm of breast: Secondary | ICD-10-CM

## 2018-05-03 ENCOUNTER — Other Ambulatory Visit: Payer: Self-pay | Admitting: Internal Medicine

## 2018-05-03 DIAGNOSIS — C3491 Malignant neoplasm of unspecified part of right bronchus or lung: Secondary | ICD-10-CM

## 2018-05-13 ENCOUNTER — Telehealth: Payer: Self-pay | Admitting: Internal Medicine

## 2018-05-13 ENCOUNTER — Encounter: Payer: Self-pay | Admitting: Internal Medicine

## 2018-05-13 ENCOUNTER — Inpatient Hospital Stay: Payer: Medicare Other | Attending: Internal Medicine | Admitting: Internal Medicine

## 2018-05-13 ENCOUNTER — Inpatient Hospital Stay: Payer: Medicare Other

## 2018-05-13 VITALS — BP 104/88 | HR 92 | Temp 98.4°F | Resp 18 | Ht 65.0 in | Wt 115.5 lb

## 2018-05-13 DIAGNOSIS — K649 Unspecified hemorrhoids: Secondary | ICD-10-CM | POA: Diagnosis not present

## 2018-05-13 DIAGNOSIS — I10 Essential (primary) hypertension: Secondary | ICD-10-CM

## 2018-05-13 DIAGNOSIS — Z923 Personal history of irradiation: Secondary | ICD-10-CM | POA: Diagnosis not present

## 2018-05-13 DIAGNOSIS — M810 Age-related osteoporosis without current pathological fracture: Secondary | ICD-10-CM | POA: Diagnosis not present

## 2018-05-13 DIAGNOSIS — F329 Major depressive disorder, single episode, unspecified: Secondary | ICD-10-CM | POA: Insufficient documentation

## 2018-05-13 DIAGNOSIS — Z9221 Personal history of antineoplastic chemotherapy: Secondary | ICD-10-CM | POA: Diagnosis not present

## 2018-05-13 DIAGNOSIS — J449 Chronic obstructive pulmonary disease, unspecified: Secondary | ICD-10-CM | POA: Diagnosis not present

## 2018-05-13 DIAGNOSIS — Z7982 Long term (current) use of aspirin: Secondary | ICD-10-CM | POA: Diagnosis not present

## 2018-05-13 DIAGNOSIS — Z79899 Other long term (current) drug therapy: Secondary | ICD-10-CM | POA: Insufficient documentation

## 2018-05-13 DIAGNOSIS — C3491 Malignant neoplasm of unspecified part of right bronchus or lung: Secondary | ICD-10-CM

## 2018-05-13 DIAGNOSIS — K219 Gastro-esophageal reflux disease without esophagitis: Secondary | ICD-10-CM | POA: Insufficient documentation

## 2018-05-13 DIAGNOSIS — C342 Malignant neoplasm of middle lobe, bronchus or lung: Secondary | ICD-10-CM

## 2018-05-13 DIAGNOSIS — R0602 Shortness of breath: Secondary | ICD-10-CM

## 2018-05-13 LAB — CMP (CANCER CENTER ONLY)
ALK PHOS: 55 U/L (ref 40–150)
ALT: 22 U/L (ref 0–55)
AST: 28 U/L (ref 5–34)
Albumin: 3.7 g/dL (ref 3.5–5.0)
Anion gap: 8 (ref 3–11)
BILIRUBIN TOTAL: 0.9 mg/dL (ref 0.2–1.2)
BUN: 31 mg/dL — ABNORMAL HIGH (ref 7–26)
CHLORIDE: 109 mmol/L (ref 98–109)
CO2: 25 mmol/L (ref 22–29)
CREATININE: 1.17 mg/dL — AB (ref 0.60–1.10)
Calcium: 9 mg/dL (ref 8.4–10.4)
GFR, EST AFRICAN AMERICAN: 49 mL/min — AB (ref >60)
GFR, Estimated: 43 mL/min — ABNORMAL LOW (ref >60)
Glucose, Bld: 88 mg/dL (ref 70–140)
Potassium: 4.1 mmol/L (ref 3.5–5.1)
SODIUM: 142 mmol/L (ref 136–145)
Total Protein: 6 g/dL — ABNORMAL LOW (ref 6.4–8.3)

## 2018-05-13 LAB — CBC WITH DIFFERENTIAL (CANCER CENTER ONLY)
Basophils Absolute: 0 10*3/uL (ref 0.0–0.1)
Basophils Relative: 1 %
Eosinophils Absolute: 0.2 10*3/uL (ref 0.0–0.5)
Eosinophils Relative: 4 %
HEMATOCRIT: 37.6 % (ref 34.8–46.6)
HEMOGLOBIN: 12.5 g/dL (ref 11.6–15.9)
Lymphocytes Relative: 14 %
Lymphs Abs: 0.7 10*3/uL — ABNORMAL LOW (ref 0.9–3.3)
MCH: 31.5 pg (ref 25.1–34.0)
MCHC: 33.3 g/dL (ref 31.5–36.0)
MCV: 94.6 fL (ref 79.5–101.0)
MONOS PCT: 9 %
Monocytes Absolute: 0.4 10*3/uL (ref 0.1–0.9)
NEUTROS ABS: 3.7 10*3/uL (ref 1.5–6.5)
NEUTROS PCT: 72 %
Platelet Count: 212 10*3/uL (ref 145–400)
RBC: 3.98 MIL/uL (ref 3.70–5.45)
RDW: 14.6 % — ABNORMAL HIGH (ref 11.2–14.5)
WBC: 5.1 10*3/uL (ref 3.9–10.3)

## 2018-05-13 NOTE — Telephone Encounter (Signed)
Appointments scheduled AVS/Calendar printed per 5/21 los °

## 2018-05-13 NOTE — Progress Notes (Signed)
[name] Telephone:(336) [phone]   Fax:(336) [phone]  OFFICE PROGRESS NOTE  [name], MD [name] [address]  DIAGNOSIS: Stage IIIB (T2a, N3, M0) non-small cell lung cancer, adenocarcinoma with positive EGFR mutation diagnosed in January 2010.  PRIOR THERAPY: 1) Status post concurrent chemoradiation with weekly carboplatin and paclitaxel; last dose given March 21, 2009.  2) Tarceva at 150 mg p.o. daily, status post approximately 48 months of treatment, discontinued secondary to persistent diarrhea.  3) status post right Pleurx catheter placement for right nonmalignant pleural effusion.  CURRENT THERAPY: Tarceva 100 mg by mouth daily started 03/19/2013, status post 62 months of treatment.  INTERVAL HISTORY: Dana [name] 83 y.o. female returns to the clinic today for 2 months follow-up visit.  The patient is feeling fine today with no concerning complaints.  She denied having any chest pain, shortness of breath, cough or hemoptysis.  She denied having any recent weight loss or night sweats.  She has no nausea, vomiting, diarrhea or constipation.  She continues to tolerate her treatment with Tarceva fairly well.  She is here today for evaluation and repeat blood work.   MEDICAL HISTORY: Past Medical History:  Diagnosis Date  . Arthritis    HANDS,  WRISTS  . COPD (chronic obstructive pulmonary disease) ([place])   . Depression 04/04/2017  . Dyspnea on exertion   . Encounter for therapeutic drug monitoring 10/01/2016  . GERD (gastroesophageal reflux disease)   . Hemorrhoid   . History of anal fissures   . History of lung cancer ONCOLOGIST--  DR [facility]--  LAST CT ,  NO RECURRENCE OR METS   DX JAN 2010 --  STAGE IIIA  NON-SMALL CELL ADENOCARCINOMA (RIGHT MIDDLE LOBE)---  S/P  CHEMORADIATIO THERAPY  (COMPLETE 03-21-2009)  . HTN (hypertension) 10/10/2017  . Imbalance 06/04/2017  . lung ca dx'd 2010  . Normal cardiac stress test    2007   PER PT  . Osteoporosis   . Rash, skin    RIGHT FOREARM/ HAND  . Right wrist pain    MASS  . Wears glasses     ALLERGIES:  is allergic to amoxicillin.  MEDICATIONS:  Current Outpatient Medications  Medication Sig Dispense Refill  . aspirin EC 81 MG tablet Take 81 mg by mouth daily.    Marland Kitchen augmented betamethasone dipropionate (DIPROLENE-AF) 0.05 % ointment Apply 1 application topically 2 (two) times daily as needed.    . bismuth subsalicylate (PEPTO BISMOL) 262 MG/15ML suspension Take 30 mLs by mouth every three (3) days as needed.    . Calcium Carb-Cholecalciferol (CALCIUM-VITAMIN D) 500-200 MG-UNIT tablet Take by mouth.    . Calcium Carbonate-Vitamin D 600-400 MG-UNIT per chew tablet Chew 1 tablet by mouth 2 (two) times daily.    . cholecalciferol (VITAMIN D) 1000 UNITS tablet Take 1,000 Units by mouth daily.     . clindamycin (CLEOCIN-T) 1 % external solution Apply topically 2 (two) times daily. 30 mL 0  . Coenzyme Q10 (COQ10) 100 MG CAPS Take 1 capsule by mouth daily.     . Cyanocobalamin (B-12) 1000 MCG CAPS Take 1,000 mcg by mouth 3 (three) times daily with meals.     . erlotinib (TARCEVA) 100 MG tablet Take 1 tablet (100 mg total) by mouth daily. Take on an empty stomach 1 hour before meals or 2 hours after 30 tablet 2  . hydroxychloroquine (PLAQUENIL) 200 MG tablet Take by mouth.    . ibandronate (BONIVA)  150 MG tablet     . loperamide (IMODIUM A-D) 2 MG tablet Take 1 mg by mouth 4 (four) times daily as needed for diarrhea or loose stools.     Marland Kitchen loratadine (CLARITIN) 10 MG tablet Take 10 mg by mouth daily as needed for allergies.     Marland Kitchen LORazepam (ATIVAN) 0.5 MG tablet Take 1 tablet (0.5 mg total) by mouth every 8 (eight) hours as needed for anxiety. 30 tablet 0  . meloxicam (MOBIC) 15 MG tablet Take 15 mg by mouth daily.  4  . mirtazapine (REMERON) 15 MG tablet TAKE 1 TABLET BY MOUTH ONCE DAILY AT BEDTIME 30 tablet 2  . Polyethyl Glycol-Propyl Glycol (SYSTANE OP) Place 1 drop into  both eyes at bedtime as needed (for dry eyes.).     Marland Kitchen psyllium (METAMUCIL) 58.6 % powder Take 1 packet by mouth daily as needed.    . Simethicone (GAS-X PO) Take 1 tablet by mouth 2 (two) times daily as needed (for gas).     . Hydrocodone-Acetaminophen (VICODIN) 5-300 MG TABS Take 1 tablet by mouth 4 (four) times daily as needed (for pain).     Marland Kitchen oxyCODONE (OXY IR/ROXICODONE) 5 MG immediate release tablet Take 1 tablet (5 mg total) by mouth every 4 (four) hours as needed for moderate pain. (Patient not taking: Reported on 05/13/2018) 30 tablet 0   No current facility-administered medications for this visit.     SURGICAL HISTORY:  Past Surgical History:  Procedure Laterality Date  . ANAL FISSURE REPAIR  07/09/2011   INTERNAL SPHINCTEROTOMY  . BENIGN EXCISION LEFT BREAST CENTRAL DUCT  02/1999  . BREAST BIOPSY  01/31/2009   benign  . BREAST EXCISIONAL BIOPSY Left 2004   benign  . CHEST TUBE INSERTION Right 07/14/2014   Procedure: INSERTION PLEURAL DRAINAGE CATHETER RIGHT CHEST;  Surgeon: Grace Isaac, MD;  Location: [place];  Service: Thoracic;  Laterality: Right;  . EXCISION RIGHT WRIST MASS  2012  . KNEE ARTHROSCOPY Right 1999  . MASS EXCISION Right 03/11/2014   Procedure: RIGHT WRIST DEEP MASS EXCISION WITH CULTURE AND BIOSPY;  Surgeon: Linna Hoff, MD;  Location: [place];  Service: Orthopedics;  Laterality: Right;  . REMOVAL OF PLEURAL DRAINAGE CATHETER Right 10/14/2014   Procedure: REMOVAL OF PLEURAL DRAINAGE CATHETER;  Surgeon: Grace Isaac, MD;  Location: [place];  Service: Thoracic;  Laterality: Right;  . TALC PLEURODESIS Right 09/16/2014   Procedure: TALC PLEURADESIS/ slurry;  Surgeon: Grace Isaac, MD;  Location: [place];  Service: Thoracic;  Laterality: Right;  . THORACOSCOPY  01-26-2009   w/   lung/  node biopsy's  . THUMB ARTHROSCOPY Left    - removed bone spur  . TONSILLECTOMY  AS CHILD  . TOTAL ABDOMINAL HYSTERECTOMY W/ BILATERAL  SALPINGOOPHORECTOMY  1982   W/  APPENDECTOMY  . TRANSTHORACIC ECHOCARDIOGRAM  12-23-2008   NORMAL LV/  EF 65-70%/  MILD MR  &  TR  . VAULT SUSPENSION PLUS CYSTOCELE REPAIR WITH GRAFT  06-13-2010    REVIEW OF SYSTEMS:  A comprehensive review of systems was negative.   PHYSICAL EXAMINATION: General appearance: alert, cooperative and no distress Head: Normocephalic, without obvious abnormality, atraumatic Neck: no adenopathy, no JVD, supple, symmetrical, trachea midline and thyroid not enlarged, symmetric, no tenderness/mass/nodules Lymph nodes: Cervical, supraclavicular, and axillary nodes normal. Resp: clear to auscultation bilaterally Back: symmetric, no curvature. ROM normal. No CVA tenderness. Cardio: regular rate and rhythm, S1, S2 normal, no murmur, click, rub  or gallop GI: soft, non-tender; bowel sounds normal; no masses,  no organomegaly Extremities: extremities normal, atraumatic, no cyanosis or edema  ECOG PERFORMANCE STATUS: 1 - Symptomatic but completely ambulatory  Blood pressure 104/88, pulse 92, temperature 98.4 F (36.9 C), temperature source Oral, resp. rate 18, height '5\' 5"'$  (1.651 m), weight 115 lb 8 oz (52.4 kg), SpO2 98 %.  LABORATORY DATA: Lab Results  Component Value Date   WBC 5.1 05/13/2018   HGB 12.5 05/13/2018   HCT 37.6 05/13/2018   MCV 94.6 05/13/2018   PLT 212 05/13/2018      Chemistry      Component Value Date/Time   NA 142 05/13/2018 1307   NA 142 12/10/2017 1000   K 4.1 05/13/2018 1307   K 4.0 12/10/2017 1000   CL 109 05/13/2018 1307   CL 108 (H) 06/03/2013 0939   CO2 25 05/13/2018 1307   CO2 28 12/10/2017 1000   BUN 31 (H) 05/13/2018 1307   BUN 20.4 12/10/2017 1000   CREATININE 1.17 (H) 05/13/2018 1307   CREATININE 0.9 12/10/2017 1000      Component Value Date/Time   CALCIUM 9.0 05/13/2018 1307   CALCIUM 9.7 12/10/2017 1000   ALKPHOS 55 05/13/2018 1307   ALKPHOS 63 12/10/2017 1000   AST 28 05/13/2018 1307   AST 25 12/10/2017 1000     ALT 22 05/13/2018 1307   ALT 17 12/10/2017 1000   BILITOT 0.9 05/13/2018 1307   BILITOT 1.05 12/10/2017 1000       RADIOGRAPHIC STUDIES: No results found.  ASSESSMENT AND PLAN:  This is a very pleasant 83 years old white female with stage IIIB non-small cell lung cancer, adenocarcinoma with positive EGFR mutation diagnosed in January 2010. She has been on treatment with Tarceva for almost 9 years.   The patient continues to tolerate this treatment well with no concerning complaints. Blood work today is unremarkable except for slightly elevated serum creatinine.  I encouraged the patient to increase her oral intake of fluid. I will see her back for follow-up visit in 2 months for evaluation with repeat blood work. She was advised to call immediately if she has any concerning symptoms in the interval. The patient voices understanding of current disease status and treatment options and is in agreement with the current care plan. All questions were answered. The patient knows to call the clinic with any problems, questions or concerns. We can certainly see the patient much sooner if necessary. Disclaimer: This note was dictated with voice recognition software. Similar sounding words can inadvertently be transcribed and may not be corrected upon review.

## 2018-05-22 ENCOUNTER — Ambulatory Visit
Admission: RE | Admit: 2018-05-22 | Discharge: 2018-05-22 | Disposition: A | Discharge status: Home / Self Care | Payer: Medicare Other | Source: Ambulatory Visit | Attending: Family Medicine | Admitting: Family Medicine

## 2018-05-22 ENCOUNTER — Ambulatory Visit: Payer: Medicare Other

## 2018-05-22 DIAGNOSIS — Z1231 Encounter for screening mammogram for malignant neoplasm of breast: Secondary | ICD-10-CM

## 2018-06-30 ENCOUNTER — Telehealth: Payer: Self-pay | Admitting: Medical Oncology

## 2018-06-30 ENCOUNTER — Other Ambulatory Visit: Payer: Self-pay | Admitting: Medical Oncology

## 2018-06-30 DIAGNOSIS — C3491 Malignant neoplasm of unspecified part of right bronchus or lung: Secondary | ICD-10-CM

## 2018-06-30 MED ORDER — ERLOTINIB HCL 100 MG PO TABS: 100.0000 mg | ORAL_TABLET | Freq: Every day | ORAL | 5 refills | Status: DC

## 2018-06-30 NOTE — Telephone Encounter (Signed)
tarceva refills #5  called in .

## 2018-07-16 ENCOUNTER — Telehealth: Payer: Self-pay | Admitting: Pharmacist

## 2018-07-16 ENCOUNTER — Telehealth: Payer: Self-pay | Admitting: Internal Medicine

## 2018-07-16 ENCOUNTER — Inpatient Hospital Stay: Payer: Medicare Other | Attending: Internal Medicine

## 2018-07-16 ENCOUNTER — Encounter: Payer: Self-pay | Admitting: Internal Medicine

## 2018-07-16 ENCOUNTER — Inpatient Hospital Stay (HOSPITAL_BASED_OUTPATIENT_CLINIC_OR_DEPARTMENT_OTHER): Payer: Medicare Other | Admitting: Internal Medicine

## 2018-07-16 VITALS — BP 146/88 | HR 94 | Temp 98.6°F | Resp 18 | Ht 65.0 in | Wt 116.5 lb

## 2018-07-16 DIAGNOSIS — M810 Age-related osteoporosis without current pathological fracture: Secondary | ICD-10-CM

## 2018-07-16 DIAGNOSIS — Z9221 Personal history of antineoplastic chemotherapy: Secondary | ICD-10-CM | POA: Diagnosis not present

## 2018-07-16 DIAGNOSIS — Z923 Personal history of irradiation: Secondary | ICD-10-CM | POA: Diagnosis not present

## 2018-07-16 DIAGNOSIS — Z79899 Other long term (current) drug therapy: Secondary | ICD-10-CM | POA: Insufficient documentation

## 2018-07-16 DIAGNOSIS — R5383 Other fatigue: Secondary | ICD-10-CM

## 2018-07-16 DIAGNOSIS — J449 Chronic obstructive pulmonary disease, unspecified: Secondary | ICD-10-CM | POA: Insufficient documentation

## 2018-07-16 DIAGNOSIS — M129 Arthropathy, unspecified: Secondary | ICD-10-CM | POA: Insufficient documentation

## 2018-07-16 DIAGNOSIS — K219 Gastro-esophageal reflux disease without esophagitis: Secondary | ICD-10-CM | POA: Insufficient documentation

## 2018-07-16 DIAGNOSIS — I10 Essential (primary) hypertension: Secondary | ICD-10-CM | POA: Insufficient documentation

## 2018-07-16 DIAGNOSIS — C3490 Malignant neoplasm of unspecified part of unspecified bronchus or lung: Secondary | ICD-10-CM

## 2018-07-16 DIAGNOSIS — C3491 Malignant neoplasm of unspecified part of right bronchus or lung: Secondary | ICD-10-CM

## 2018-07-16 DIAGNOSIS — F329 Major depressive disorder, single episode, unspecified: Secondary | ICD-10-CM | POA: Insufficient documentation

## 2018-07-16 DIAGNOSIS — Z5181 Encounter for therapeutic drug level monitoring: Secondary | ICD-10-CM

## 2018-07-16 DIAGNOSIS — Z7982 Long term (current) use of aspirin: Secondary | ICD-10-CM | POA: Insufficient documentation

## 2018-07-16 DIAGNOSIS — C342 Malignant neoplasm of middle lobe, bronchus or lung: Secondary | ICD-10-CM

## 2018-07-16 LAB — CBC WITH DIFFERENTIAL (CANCER CENTER ONLY)
BASOS ABS: 0 10*3/uL (ref 0.0–0.1)
BASOS PCT: 0 %
EOS ABS: 0.4 10*3/uL (ref 0.0–0.5)
Eosinophils Relative: 7 %
HCT: 40 % (ref 34.8–46.6)
Hemoglobin: 13.3 g/dL (ref 11.6–15.9)
Lymphocytes Relative: 14 %
Lymphs Abs: 0.8 10*3/uL — ABNORMAL LOW (ref 0.9–3.3)
MCH: 31.8 pg (ref 25.1–34.0)
MCHC: 33.3 g/dL (ref 31.5–36.0)
MCV: 95.7 fL (ref 79.5–101.0)
MONO ABS: 0.4 10*3/uL (ref 0.1–0.9)
MONOS PCT: 8 %
NEUTROS PCT: 71 %
Neutro Abs: 3.9 10*3/uL (ref 1.5–6.5)
Platelet Count: 241 10*3/uL (ref 145–400)
RBC: 4.18 MIL/uL (ref 3.70–5.45)
RDW: 14 % (ref 11.2–14.5)
WBC Count: 5.5 10*3/uL (ref 3.9–10.3)

## 2018-07-16 LAB — CMP (CANCER CENTER ONLY)
ALT: 19 U/L (ref 0–44)
ANION GAP: 8 (ref 5–15)
AST: 28 U/L (ref 15–41)
Albumin: 3.8 g/dL (ref 3.5–5.0)
Alkaline Phosphatase: 55 U/L (ref 38–126)
BUN: 21 mg/dL (ref 8–23)
CHLORIDE: 106 mmol/L (ref 98–111)
CO2: 27 mmol/L (ref 22–32)
Calcium: 9.5 mg/dL (ref 8.9–10.3)
Creatinine: 0.93 mg/dL (ref 0.44–1.00)
GFR, Est AFR Am: >60 mL/min (ref >60)
GFR, Estimated: 56 mL/min — ABNORMAL LOW (ref >60)
Glucose, Bld: 88 mg/dL (ref 70–99)
POTASSIUM: 4.3 mmol/L (ref 3.5–5.1)
SODIUM: 141 mmol/L (ref 135–145)
Total Bilirubin: 0.8 mg/dL (ref 0.3–1.2)
Total Protein: 6.4 g/dL — ABNORMAL LOW (ref 6.5–8.1)

## 2018-07-16 NOTE — Telephone Encounter (Signed)
Oral Oncology Pharmacist Encounter  Received notification from MD that patient had concerns about Tarceva continued acquisition. Original Tarceva start date: 03/19/2013  Patient currently is receiving her Tarceva through [place] patient assistant program at $0 out of pocket cost, through [employer]. Enrolled in Jan 2019.  At some point refill for Tarceva had been sent to [facility] instead of [facility].  New prescription for Tarceva was already sent to correct dispensing pharmacy on 06/30/2018. Patient states she has already received 1 fill from this new prescription. There are 5 remaining refills on current prescription.  Patient is enrolled with [employer] until the end of her therapy. Patient will continue to receive her Tarceva from [facility] pharmacy as long as she is enrolled in their program for Tarceva, and [facility] remains the patient assistance pharmacy for the manufacturer assistance program.  Patient care coordination note regarding dispensing pharmacy added to patient snapshot in EMR.  Patient states that she has missed 0 doses of Tarceva 100mg  tablets, 1 tablet taken by mouth once daily on an empty stomach, 1 hour before or 2 hours after food missed in the last month.  All questions answered. Patient expressed understanding and appreciation. She knows to call the office with any additional questions or concerns.  Johny Drilling, PharmD, BCPS, BCOP  07/16/2018 12:55 PM [facility] Oncology [facility] [phone]

## 2018-07-16 NOTE — Telephone Encounter (Signed)
Appointments scheduled AVS/Calendar printed per 7/24 los

## 2018-07-16 NOTE — Progress Notes (Signed)
[facility] Telephone:(336) [phone]   Fax:(336) [phone]  OFFICE PROGRESS NOTE  [name], MD [place] [address]  DIAGNOSIS: Stage IIIB (T2a, N3, M0) non-small cell lung cancer, adenocarcinoma with positive EGFR mutation diagnosed in January 2010.  PRIOR THERAPY: 1) Status post concurrent chemoradiation with weekly carboplatin and paclitaxel; last dose given March 21, 2009.  2) Tarceva at 150 mg p.o. daily, status post approximately 48 months of treatment, discontinued secondary to persistent diarrhea.  3) status post right Pleurx catheter placement for right nonmalignant pleural effusion.  CURRENT THERAPY: Tarceva 100 mg by mouth daily started 03/19/2013, status post 64 months of treatment.  INTERVAL HISTORY: Dana [name] 83 y.o. female returns to the clinic today for follow-up visit.  The patient is feeling fine today with no concerning complaints except for intermittent fatigue.  She continues to exercise at regular basis.  She denied having any chest pain, shortness of breath, cough or hemoptysis.  She denied having any fever or chills.  She has no nausea, vomiting, diarrhea or constipation.  The patient denied having any recent weight loss or night sweats.  She has no headache or visual changes.  She is here today for evaluation and repeat blood work.   MEDICAL HISTORY: Past Medical History:  Diagnosis Date  . Arthritis    HANDS,  WRISTS  . COPD (chronic obstructive pulmonary disease) ([place])   . Depression 04/04/2017  . Dyspnea on exertion   . Encounter for therapeutic drug monitoring 10/01/2016  . GERD (gastroesophageal reflux disease)   . Hemorrhoid   . History of anal fissures   . History of lung cancer ONCOLOGIST--  DR [facility]--  LAST CT ,  NO RECURRENCE OR METS   DX JAN 2010 --  STAGE IIIA  NON-SMALL CELL ADENOCARCINOMA (RIGHT MIDDLE LOBE)---  S/P  CHEMORADIATIO THERAPY  (COMPLETE 03-21-2009)  . HTN (hypertension) 10/10/2017   . Imbalance 06/04/2017  . lung ca dx'd 2010  . Normal cardiac stress test    2007  PER PT  . Osteoporosis   . Rash, skin    RIGHT FOREARM/ HAND  . Right wrist pain    MASS  . Wears glasses     ALLERGIES:  is allergic to amoxicillin.  MEDICATIONS:  Current Outpatient Medications  Medication Sig Dispense Refill  . aspirin EC 81 MG tablet Take 81 mg by mouth daily.    Marland Kitchen augmented betamethasone dipropionate (DIPROLENE-AF) 0.05 % ointment Apply 1 application topically 2 (two) times daily as needed.    . bismuth subsalicylate (PEPTO BISMOL) 262 MG/15ML suspension Take 30 mLs by mouth every three (3) days as needed.    . Calcium Carb-Cholecalciferol (CALCIUM-VITAMIN D) 500-200 MG-UNIT tablet Take by mouth.    . Calcium Carbonate-Vitamin D 600-400 MG-UNIT per chew tablet Chew 1 tablet by mouth 2 (two) times daily.    . cholecalciferol (VITAMIN D) 1000 UNITS tablet Take 1,000 Units by mouth daily.     . clindamycin (CLEOCIN-T) 1 % external solution Apply topically 2 (two) times daily. 30 mL 0  . Coenzyme Q10 (COQ10) 100 MG CAPS Take 1 capsule by mouth daily.     . Cyanocobalamin (B-12) 1000 MCG CAPS Take 1,000 mcg by mouth 3 (three) times daily with meals.     . erlotinib (TARCEVA) 100 MG tablet Take 1 tablet (100 mg total) by mouth daily. Take on an empty stomach 1 hour before meals or 2 hours after 30 tablet 5  .  Hydrocodone-Acetaminophen (VICODIN) 5-300 MG TABS Take 1 tablet by mouth 4 (four) times daily as needed (for pain).     . hydroxychloroquine (PLAQUENIL) 200 MG tablet Take by mouth.    . ibandronate (BONIVA) 150 MG tablet     . loperamide (IMODIUM A-D) 2 MG tablet Take 1 mg by mouth 4 (four) times daily as needed for diarrhea or loose stools.     Marland Kitchen loratadine (CLARITIN) 10 MG tablet Take 10 mg by mouth daily as needed for allergies.     Marland Kitchen LORazepam (ATIVAN) 0.5 MG tablet Take 1 tablet (0.5 mg total) by mouth every 8 (eight) hours as needed for anxiety. 30 tablet 0  . meloxicam  (MOBIC) 15 MG tablet Take 15 mg by mouth daily.  4  . mirtazapine (REMERON) 15 MG tablet TAKE 1 TABLET BY MOUTH ONCE DAILY AT BEDTIME 30 tablet 2  . oxyCODONE (OXY IR/ROXICODONE) 5 MG immediate release tablet Take 1 tablet (5 mg total) by mouth every 4 (four) hours as needed for moderate pain. (Patient not taking: Reported on 05/13/2018) 30 tablet 0  . Polyethyl Glycol-Propyl Glycol (SYSTANE OP) Place 1 drop into both eyes at bedtime as needed (for dry eyes.).     Marland Kitchen psyllium (METAMUCIL) 58.6 % powder Take 1 packet by mouth daily as needed.    . Simethicone (GAS-X PO) Take 1 tablet by mouth 2 (two) times daily as needed (for gas).      No current facility-administered medications for this visit.     SURGICAL HISTORY:  Past Surgical History:  Procedure Laterality Date  . ANAL FISSURE REPAIR  07/09/2011   INTERNAL SPHINCTEROTOMY  . BENIGN EXCISION LEFT BREAST CENTRAL DUCT  02/1999  . BREAST BIOPSY  01/31/2009   benign  . BREAST EXCISIONAL BIOPSY Left 2004   benign  . CHEST TUBE INSERTION Right 07/14/2014   Procedure: INSERTION PLEURAL DRAINAGE CATHETER RIGHT CHEST;  Surgeon: Grace Isaac, MD;  Location: [place];  Service: Thoracic;  Laterality: Right;  . EXCISION RIGHT WRIST MASS  2012  . KNEE ARTHROSCOPY Right 1999  . MASS EXCISION Right 03/11/2014   Procedure: RIGHT WRIST DEEP MASS EXCISION WITH CULTURE AND BIOSPY;  Surgeon: Linna Hoff, MD;  Location: [facility];  Service: Orthopedics;  Laterality: Right;  . REMOVAL OF PLEURAL DRAINAGE CATHETER Right 10/14/2014   Procedure: REMOVAL OF PLEURAL DRAINAGE CATHETER;  Surgeon: Grace Isaac, MD;  Location: [place];  Service: Thoracic;  Laterality: Right;  . TALC PLEURODESIS Right 09/16/2014   Procedure: TALC PLEURADESIS/ slurry;  Surgeon: Grace Isaac, MD;  Location: [place];  Service: Thoracic;  Laterality: Right;  . THORACOSCOPY  01-26-2009   w/   lung/  node biopsy's  . THUMB ARTHROSCOPY Left    - removed bone spur    . TONSILLECTOMY  AS CHILD  . TOTAL ABDOMINAL HYSTERECTOMY W/ BILATERAL SALPINGOOPHORECTOMY  1982   W/  APPENDECTOMY  . TRANSTHORACIC ECHOCARDIOGRAM  12-23-2008   NORMAL LV/  EF 65-70%/  MILD MR  &  TR  . VAULT SUSPENSION PLUS CYSTOCELE REPAIR WITH GRAFT  06-13-2010    REVIEW OF SYSTEMS:  A comprehensive review of systems was negative except for: Constitutional: positive for fatigue   PHYSICAL EXAMINATION: General appearance: alert, cooperative and no distress Head: Normocephalic, without obvious abnormality, atraumatic Neck: no adenopathy, no JVD, supple, symmetrical, trachea midline and thyroid not enlarged, symmetric, no tenderness/mass/nodules Lymph nodes: Cervical, supraclavicular, and axillary nodes normal. Resp: clear to auscultation bilaterally  Back: symmetric, no curvature. ROM normal. No CVA tenderness. Cardio: regular rate and rhythm, S1, S2 normal, no murmur, click, rub or gallop GI: soft, non-tender; bowel sounds normal; no masses,  no organomegaly Extremities: extremities normal, atraumatic, no cyanosis or edema  ECOG PERFORMANCE STATUS: 1 - Symptomatic but completely ambulatory  Blood pressure (!) 146/88, pulse 94, temperature 98.6 F (37 C), temperature source Oral, resp. rate 18, height '5\' 5"'$  (1.651 m), weight 116 lb 8 oz (52.8 kg), SpO2 99 %.  LABORATORY DATA: Lab Results  Component Value Date   WBC 5.5 07/16/2018   HGB 13.3 07/16/2018   HCT 40.0 07/16/2018   MCV 95.7 07/16/2018   PLT 241 07/16/2018      Chemistry      Component Value Date/Time   NA 142 05/13/2018 1307   NA 142 12/10/2017 1000   K 4.1 05/13/2018 1307   K 4.0 12/10/2017 1000   CL 109 05/13/2018 1307   CL 108 (H) 06/03/2013 0939   CO2 25 05/13/2018 1307   CO2 28 12/10/2017 1000   BUN 31 (H) 05/13/2018 1307   BUN 20.4 12/10/2017 1000   CREATININE 1.17 (H) 05/13/2018 1307   CREATININE 0.9 12/10/2017 1000      Component Value Date/Time   CALCIUM 9.0 05/13/2018 1307   CALCIUM 9.7  12/10/2017 1000   ALKPHOS 55 05/13/2018 1307   ALKPHOS 63 12/10/2017 1000   AST 28 05/13/2018 1307   AST 25 12/10/2017 1000   ALT 22 05/13/2018 1307   ALT 17 12/10/2017 1000   BILITOT 0.9 05/13/2018 1307   BILITOT 1.05 12/10/2017 1000       RADIOGRAPHIC STUDIES: No results found.  ASSESSMENT AND PLAN:  This is a very pleasant 83 years old white female with stage IIIB non-small cell lung cancer, adenocarcinoma with positive EGFR mutation diagnosed in January 2010. She has been on treatment with Tarceva for more than 9 years.   She is currently on Tarceva 100 mg p.o. daily status post 64 months. She continues to tolerate the treatment well with no concerning adverse effects. I recommended for the patient to continue her treatment with Tarceva with the same dose. I will see her back for follow-up visit in 2 months for evaluation with repeat CT scan of the chest for restaging of her disease. She was advised to call immediately if she has any concerning symptoms in the interval. The patient voices understanding of current disease status and treatment options and is in agreement with the current care plan. All questions were answered. The patient knows to call the clinic with any problems, questions or concerns. We can certainly see the patient much sooner if necessary. Disclaimer: This note was dictated with voice recognition software. Similar sounding words can inadvertently be transcribed and may not be corrected upon review.

## 2018-08-17 ENCOUNTER — Other Ambulatory Visit: Payer: Self-pay | Admitting: Internal Medicine

## 2018-08-17 DIAGNOSIS — C3491 Malignant neoplasm of unspecified part of right bronchus or lung: Secondary | ICD-10-CM

## 2018-09-12 ENCOUNTER — Ambulatory Visit (HOSPITAL_COMMUNITY)
Admission: RE | Admit: 2018-09-12 | Discharge: 2018-09-12 | Disposition: A | Discharge status: Home / Self Care | Payer: Medicare Other | Source: Ambulatory Visit | Attending: Internal Medicine | Admitting: Internal Medicine

## 2018-09-12 ENCOUNTER — Inpatient Hospital Stay: Payer: Medicare Other | Attending: Internal Medicine

## 2018-09-12 ENCOUNTER — Encounter (HOSPITAL_COMMUNITY): Payer: Self-pay

## 2018-09-12 DIAGNOSIS — C342 Malignant neoplasm of middle lobe, bronchus or lung: Secondary | ICD-10-CM | POA: Insufficient documentation

## 2018-09-12 DIAGNOSIS — I10 Essential (primary) hypertension: Secondary | ICD-10-CM | POA: Diagnosis not present

## 2018-09-12 DIAGNOSIS — Z79899 Other long term (current) drug therapy: Secondary | ICD-10-CM | POA: Insufficient documentation

## 2018-09-12 DIAGNOSIS — I700 Atherosclerosis of aorta: Secondary | ICD-10-CM | POA: Insufficient documentation

## 2018-09-12 DIAGNOSIS — Z7982 Long term (current) use of aspirin: Secondary | ICD-10-CM | POA: Diagnosis not present

## 2018-09-12 DIAGNOSIS — C3490 Malignant neoplasm of unspecified part of unspecified bronchus or lung: Secondary | ICD-10-CM | POA: Diagnosis not present

## 2018-09-12 DIAGNOSIS — Z9221 Personal history of antineoplastic chemotherapy: Secondary | ICD-10-CM | POA: Diagnosis not present

## 2018-09-12 DIAGNOSIS — F329 Major depressive disorder, single episode, unspecified: Secondary | ICD-10-CM | POA: Insufficient documentation

## 2018-09-12 DIAGNOSIS — J449 Chronic obstructive pulmonary disease, unspecified: Secondary | ICD-10-CM | POA: Insufficient documentation

## 2018-09-12 DIAGNOSIS — R21 Rash and other nonspecific skin eruption: Secondary | ICD-10-CM | POA: Diagnosis not present

## 2018-09-12 DIAGNOSIS — R5383 Other fatigue: Secondary | ICD-10-CM | POA: Insufficient documentation

## 2018-09-12 DIAGNOSIS — M129 Arthropathy, unspecified: Secondary | ICD-10-CM | POA: Diagnosis not present

## 2018-09-12 DIAGNOSIS — K219 Gastro-esophageal reflux disease without esophagitis: Secondary | ICD-10-CM | POA: Insufficient documentation

## 2018-09-12 LAB — CBC WITH DIFFERENTIAL (CANCER CENTER ONLY)
BASOS ABS: 0 10*3/uL (ref 0.0–0.1)
BASOS PCT: 0 %
EOS PCT: 7 %
Eosinophils Absolute: 0.3 10*3/uL (ref 0.0–0.5)
HCT: 39.9 % (ref 34.8–46.6)
Hemoglobin: 13.3 g/dL (ref 11.6–15.9)
Lymphocytes Relative: 18 %
Lymphs Abs: 0.8 10*3/uL — ABNORMAL LOW (ref 0.9–3.3)
MCH: 31.8 pg (ref 25.1–34.0)
MCHC: 33.3 g/dL (ref 31.5–36.0)
MCV: 95.5 fL (ref 79.5–101.0)
Monocytes Absolute: 0.4 10*3/uL (ref 0.1–0.9)
Monocytes Relative: 9 %
Neutro Abs: 2.9 10*3/uL (ref 1.5–6.5)
Neutrophils Relative %: 66 %
PLATELETS: 257 10*3/uL (ref 145–400)
RBC: 4.18 MIL/uL (ref 3.70–5.45)
RDW: 14.1 % (ref 11.2–14.5)
WBC: 4.5 10*3/uL (ref 3.9–10.3)

## 2018-09-12 LAB — CMP (CANCER CENTER ONLY)
ALBUMIN: 3.7 g/dL (ref 3.5–5.0)
ALT: 20 U/L (ref 0–44)
AST: 29 U/L (ref 15–41)
Alkaline Phosphatase: 57 U/L (ref 38–126)
Anion gap: 9 (ref 5–15)
BUN: 20 mg/dL (ref 8–23)
CO2: 27 mmol/L (ref 22–32)
CREATININE: 0.85 mg/dL (ref 0.44–1.00)
Calcium: 9.4 mg/dL (ref 8.9–10.3)
Chloride: 108 mmol/L (ref 98–111)
GFR, Est AFR Am: >60 mL/min (ref >60)
GFR, Estimated: >60 mL/min (ref >60)
GLUCOSE: 84 mg/dL (ref 70–99)
Potassium: 4 mmol/L (ref 3.5–5.1)
SODIUM: 144 mmol/L (ref 135–145)
Total Bilirubin: 1.2 mg/dL (ref 0.3–1.2)
Total Protein: 6.4 g/dL — ABNORMAL LOW (ref 6.5–8.1)

## 2018-09-12 MED ORDER — IOHEXOL 300 MG/ML  SOLN
75.0000 mL | Freq: Once | INTRAMUSCULAR | Status: AC | PRN
  Administered 2018-09-12: 75 mL via INTRAVENOUS

## 2018-09-16 ENCOUNTER — Inpatient Hospital Stay (HOSPITAL_BASED_OUTPATIENT_CLINIC_OR_DEPARTMENT_OTHER): Payer: Medicare Other | Admitting: Internal Medicine

## 2018-09-16 ENCOUNTER — Encounter: Payer: Self-pay | Admitting: Internal Medicine

## 2018-09-16 ENCOUNTER — Telehealth: Payer: Self-pay | Admitting: Internal Medicine

## 2018-09-16 VITALS — BP 121/73 | HR 93 | Temp 98.0°F | Resp 18 | Ht 65.0 in | Wt 117.3 lb

## 2018-09-16 DIAGNOSIS — R5383 Other fatigue: Secondary | ICD-10-CM

## 2018-09-16 DIAGNOSIS — F329 Major depressive disorder, single episode, unspecified: Secondary | ICD-10-CM

## 2018-09-16 DIAGNOSIS — J449 Chronic obstructive pulmonary disease, unspecified: Secondary | ICD-10-CM

## 2018-09-16 DIAGNOSIS — C3491 Malignant neoplasm of unspecified part of right bronchus or lung: Secondary | ICD-10-CM

## 2018-09-16 DIAGNOSIS — Z5181 Encounter for therapeutic drug level monitoring: Secondary | ICD-10-CM

## 2018-09-16 DIAGNOSIS — C342 Malignant neoplasm of middle lobe, bronchus or lung: Secondary | ICD-10-CM | POA: Diagnosis not present

## 2018-09-16 DIAGNOSIS — F3289 Other specified depressive episodes: Secondary | ICD-10-CM

## 2018-09-16 DIAGNOSIS — M129 Arthropathy, unspecified: Secondary | ICD-10-CM | POA: Diagnosis not present

## 2018-09-16 DIAGNOSIS — Z9221 Personal history of antineoplastic chemotherapy: Secondary | ICD-10-CM

## 2018-09-16 DIAGNOSIS — Z79899 Other long term (current) drug therapy: Secondary | ICD-10-CM

## 2018-09-16 DIAGNOSIS — K219 Gastro-esophageal reflux disease without esophagitis: Secondary | ICD-10-CM

## 2018-09-16 DIAGNOSIS — Z7982 Long term (current) use of aspirin: Secondary | ICD-10-CM

## 2018-09-16 DIAGNOSIS — I10 Essential (primary) hypertension: Secondary | ICD-10-CM

## 2018-09-16 DIAGNOSIS — I700 Atherosclerosis of aorta: Secondary | ICD-10-CM

## 2018-09-16 DIAGNOSIS — R21 Rash and other nonspecific skin eruption: Secondary | ICD-10-CM

## 2018-09-16 NOTE — Telephone Encounter (Signed)
[facility] scheduled AVS/calendar printed per 9/24 los

## 2018-09-16 NOTE — Progress Notes (Signed)
[place] Telephone:(336) [phone]   Fax:(336) [phone]  OFFICE PROGRESS NOTE  [name], MD [place] [address]  DIAGNOSIS: Stage IIIB (T2a, N3, M0) non-small cell lung cancer, adenocarcinoma with positive EGFR mutation diagnosed in January 2010.  PRIOR THERAPY: 1) Status post concurrent chemoradiation with weekly carboplatin and paclitaxel; last dose given March 21, 2009.  2) Tarceva at 150 mg p.o. daily, status post approximately 48 months of treatment, discontinued secondary to persistent diarrhea.  3) status post right Pleurx catheter placement for right nonmalignant pleural effusion.  CURRENT THERAPY: Tarceva 100 mg by mouth daily started 03/19/2013, status post 66 months of treatment.  INTERVAL HISTORY: Dana [name] 83 y.o. female returns to the clinic today for follow-up visit.  The patient is feeling fine today with no specific complaints except for mild fatigue.  She exercises at regular basis but she feels more tired after returning back home.  She denied having any current chest pain, shortness of breath, cough or hemoptysis.  She denied having any nausea, vomiting, diarrhea or constipation.  She has no fever or chills.  She denied having any recent weight loss or night sweats.  She has no headache or visual changes.  She continues to tolerate her treatment with Tarceva fairly well.  The patient is here today for evaluation with repeat CT scan of the chest for restaging of her disease.   MEDICAL HISTORY: Past Medical History:  Diagnosis Date  . Arthritis    HANDS,  WRISTS  . COPD (chronic obstructive pulmonary disease) ([place])   . Depression 04/04/2017  . Dyspnea on exertion   . Encounter for therapeutic drug monitoring 10/01/2016  . GERD (gastroesophageal reflux disease)   . Hemorrhoid   . History of anal fissures   . History of lung cancer ONCOLOGIST--  DR [facility]--  LAST CT ,  NO RECURRENCE OR METS   DX JAN 2010 --  STAGE  IIIA  NON-SMALL CELL ADENOCARCINOMA (RIGHT MIDDLE LOBE)---  S/P  CHEMORADIATIO THERAPY  (COMPLETE 03-21-2009)  . HTN (hypertension) 10/10/2017  . Imbalance 06/04/2017  . lung ca dx'd 2010  . Normal cardiac stress test    2007  PER PT  . Osteoporosis   . Rash, skin    RIGHT FOREARM/ HAND  . Right wrist pain    MASS  . Wears glasses     ALLERGIES:  is allergic to amoxicillin.  MEDICATIONS:  Current Outpatient Medications  Medication Sig Dispense Refill  . aspirin EC 81 MG tablet Take 81 mg by mouth daily.    Marland Kitchen augmented betamethasone dipropionate (DIPROLENE-AF) 0.05 % ointment Apply 1 application topically 2 (two) times daily as needed.    . bismuth subsalicylate (PEPTO BISMOL) 262 MG/15ML suspension Take 30 mLs by mouth every three (3) days as needed.    . Calcium Carb-Cholecalciferol (CALCIUM-VITAMIN D) 500-200 MG-UNIT tablet Take by mouth.    . Calcium Carbonate-Vitamin D 600-400 MG-UNIT per chew tablet Chew 1 tablet by mouth 2 (two) times daily.    . cholecalciferol (VITAMIN D) 1000 UNITS tablet Take 1,000 Units by mouth daily.     . clindamycin (CLEOCIN-T) 1 % external solution Apply topically 2 (two) times daily. 30 mL 0  . Coenzyme Q10 (COQ10) 100 MG CAPS Take 1 capsule by mouth daily.     . Cyanocobalamin (B-12) 1000 MCG CAPS Take 1,000 mcg by mouth 3 (three) times daily with meals.     . erlotinib (TARCEVA) 100  MG tablet Take 1 tablet (100 mg total) by mouth daily. Take on an empty stomach 1 hour before meals or 2 hours after 30 tablet 5  . Hydrocodone-Acetaminophen (VICODIN) 5-300 MG TABS Take 1 tablet by mouth 4 (four) times daily as needed (for pain).     . hydroxychloroquine (PLAQUENIL) 200 MG tablet Take by mouth.    . ibandronate (BONIVA) 150 MG tablet     . loperamide (IMODIUM A-D) 2 MG tablet Take 1 mg by mouth 4 (four) times daily as needed for diarrhea or loose stools.     Marland Kitchen loratadine (CLARITIN) 10 MG tablet Take 10 mg by mouth daily as needed for allergies.     Marland Kitchen  LORazepam (ATIVAN) 0.5 MG tablet Take 1 tablet (0.5 mg total) by mouth every 8 (eight) hours as needed for anxiety. 30 tablet 0  . meloxicam (MOBIC) 15 MG tablet Take 15 mg by mouth daily.  4  . mirtazapine (REMERON) 15 MG tablet TAKE 1 TABLET BY MOUTH ONCE DAILY AT BEDTIME 30 tablet 2  . oxyCODONE (OXY IR/ROXICODONE) 5 MG immediate release tablet Take 1 tablet (5 mg total) by mouth every 4 (four) hours as needed for moderate pain. (Patient not taking: Reported on 05/13/2018) 30 tablet 0  . Polyethyl Glycol-Propyl Glycol (SYSTANE OP) Place 1 drop into both eyes at bedtime as needed (for dry eyes.).     Marland Kitchen psyllium (METAMUCIL) 58.6 % powder Take 1 packet by mouth daily as needed.    . Simethicone (GAS-X PO) Take 1 tablet by mouth 2 (two) times daily as needed (for gas).      No current facility-administered medications for this visit.     SURGICAL HISTORY:  Past Surgical History:  Procedure Laterality Date  . ANAL FISSURE REPAIR  07/09/2011   INTERNAL SPHINCTEROTOMY  . BENIGN EXCISION LEFT BREAST CENTRAL DUCT  02/1999  . BREAST BIOPSY  01/31/2009   benign  . BREAST EXCISIONAL BIOPSY Left 2004   benign  . CHEST TUBE INSERTION Right 07/14/2014   Procedure: INSERTION PLEURAL DRAINAGE CATHETER RIGHT CHEST;  Surgeon: Grace Isaac, MD;  Location: [facility];  Service: Thoracic;  Laterality: Right;  . EXCISION RIGHT WRIST MASS  2012  . KNEE ARTHROSCOPY Right 1999  . MASS EXCISION Right 03/11/2014   Procedure: RIGHT WRIST DEEP MASS EXCISION WITH CULTURE AND BIOSPY;  Surgeon: Linna Hoff, MD;  Location: [place];  Service: Orthopedics;  Laterality: Right;  . REMOVAL OF PLEURAL DRAINAGE CATHETER Right 10/14/2014   Procedure: REMOVAL OF PLEURAL DRAINAGE CATHETER;  Surgeon: Grace Isaac, MD;  Location: [name];  Service: Thoracic;  Laterality: Right;  . TALC PLEURODESIS Right 09/16/2014   Procedure: TALC PLEURADESIS/ slurry;  Surgeon: Grace Isaac, MD;  Location: [facility];   Service: Thoracic;  Laterality: Right;  . THORACOSCOPY  01-26-2009   w/   lung/  node biopsy's  . THUMB ARTHROSCOPY Left    - removed bone spur  . TONSILLECTOMY  AS CHILD  . TOTAL ABDOMINAL HYSTERECTOMY W/ BILATERAL SALPINGOOPHORECTOMY  1982   W/  APPENDECTOMY  . TRANSTHORACIC ECHOCARDIOGRAM  12-23-2008   NORMAL LV/  EF 65-70%/  MILD MR  &  TR  . VAULT SUSPENSION PLUS CYSTOCELE REPAIR WITH GRAFT  06-13-2010    REVIEW OF SYSTEMS:  Constitutional: positive for fatigue Eyes: negative Ears, nose, mouth, throat, and face: negative Respiratory: negative Cardiovascular: negative Gastrointestinal: negative Genitourinary:negative Integument/breast: negative Hematologic/lymphatic: negative Musculoskeletal:negative Neurological: negative Behavioral/Psych: negative Endocrine: negative  Allergic/Immunologic: negative   PHYSICAL EXAMINATION: General appearance: alert, cooperative, fatigued and no distress Head: Normocephalic, without obvious abnormality, atraumatic Neck: no adenopathy, no JVD, supple, symmetrical, trachea midline and thyroid not enlarged, symmetric, no tenderness/mass/nodules Lymph nodes: Cervical, supraclavicular, and axillary nodes normal. Resp: clear to auscultation bilaterally Back: symmetric, no curvature. ROM normal. No CVA tenderness. Cardio: regular rate and rhythm, S1, S2 normal, no murmur, click, rub or gallop GI: soft, non-tender; bowel sounds normal; no masses,  no organomegaly Extremities: extremities normal, atraumatic, no cyanosis or edema Neurologic: Alert and oriented X 3, normal strength and tone. Normal symmetric reflexes. Normal coordination and gait  ECOG PERFORMANCE STATUS: 1 - Symptomatic but completely ambulatory  Blood pressure 121/73, pulse 93, temperature 98 F (36.7 C), temperature source Oral, resp. rate 18, height 5' 5" (1.651 m), weight 117 lb 4.8 oz (53.2 kg), SpO2 100 %.  LABORATORY DATA: Lab Results  Component Value Date   WBC 4.5  09/12/2018   HGB 13.3 09/12/2018   HCT 39.9 09/12/2018   MCV 95.5 09/12/2018   PLT 257 09/12/2018      Chemistry      Component Value Date/Time   NA 144 09/12/2018 0908   NA 142 12/10/2017 1000   K 4.0 09/12/2018 0908   K 4.0 12/10/2017 1000   CL 108 09/12/2018 0908   CL 108 (H) 06/03/2013 0939   CO2 27 09/12/2018 0908   CO2 28 12/10/2017 1000   BUN 20 09/12/2018 0908   BUN 20.4 12/10/2017 1000   CREATININE 0.85 09/12/2018 0908   CREATININE 0.9 12/10/2017 1000      Component Value Date/Time   CALCIUM 9.4 09/12/2018 0908   CALCIUM 9.7 12/10/2017 1000   ALKPHOS 57 09/12/2018 0908   ALKPHOS 63 12/10/2017 1000   AST 29 09/12/2018 0908   AST 25 12/10/2017 1000   ALT 20 09/12/2018 0908   ALT 17 12/10/2017 1000   BILITOT 1.2 09/12/2018 0908   BILITOT 1.05 12/10/2017 1000       RADIOGRAPHIC STUDIES: Ct Chest W Contrast  Result Date: 09/12/2018 CLINICAL DATA:  Follow-up lung cancer. EXAM: CT CHEST WITH CONTRAST TECHNIQUE: Multidetector CT imaging of the chest was performed during intravenous contrast administration. CONTRAST:  55m OMNIPAQUE IOHEXOL 300 MG/ML  SOLN COMPARISON:  03/07/2018. FINDINGS: Cardiovascular: Heart size appears within normal limits. No pericardial effusion identified. Aortic atherosclerosis. Mediastinum/Nodes: Normal appearance of the thyroid gland. The trachea appears patent and is midline. Normal appearance of the esophagus. Or supraclavicular lymph nodes. Diffuse edema throughout the mediastinal fat is again noted and likely reflects changes secondary to external beam radiation. No mediastinal adenopathy identified. Lungs/Pleura: Pleural thickening and calcification within the basilar right pleural space is again noted compatible with talc pleurodesis. No recurrent effusion. Volume loss with paramediastinal fibrosis and architectural distortion identified within the right lung compatible with changes secondary to external beam radiation. Stable from previous  exam. Part solid nodule within the left upper lobe measures 1.4 cm with a solid component measuring 4 mm, image 13/7. Stable. Anterior left upper lobe cystic nodule is stable measuring 9 mm, image 49/7. Peripheral nodule within the right upper lobe measures 4 mm and is stable from previous exam, image 32/7. Also unchanged is a small posterior right upper lobe lung nodule measuring 3 mm, image 42/7. Upper Abdomen: No acute findings identified within the upper abdomen. Musculoskeletal: No acute or significant osseous findings noted. Thoracolumbar scoliosis and degenerative disc disease noted. IMPRESSION: 1. Stable CT of the chest. 2. No  change in scratch set stable part solid nodule within the left upper lobe measuring 1.4 cm with a 4 mm solid component. Other nodules are stable. 3. No new or progressive metastatic disease within the chest. 4.  Aortic Atherosclerosis (ICD10-I70.0). Electronically Signed   By: Kerby Moors M.D.   On: 09/12/2018 12:27    ASSESSMENT AND PLAN:  This is a very pleasant 83 years old white female with stage IIIB non-small cell lung cancer, adenocarcinoma with positive EGFR mutation diagnosed in January 2010. She has been on treatment with Tarceva for more than 9 years.   She is currently on Tarceva 100 mg p.o. daily status post 66 months. The patient continues to tolerate her treatment with Tarceva fairly well. She had repeat CT scan of the chest performed recently.  I personally and independently reviewed the scans and discussed the results with the patient today. Her scan showed no concerning findings for disease progression. I recommended for the patient to continue her current treatment with Tarceva. I will see her back for follow-up visit in 2 months for evaluation with repeat blood work. She was advised to call immediately if she has any concerning symptoms in the interval. The patient voices understanding of current disease status and treatment options and is in  agreement with the current care plan. All questions were answered. The patient knows to call the clinic with any problems, questions or concerns. We can certainly see the patient much sooner if necessary. Disclaimer: This note was dictated with voice recognition software. Similar sounding words can inadvertently be transcribed and may not be corrected upon review.

## 2018-11-14 ENCOUNTER — Other Ambulatory Visit: Payer: Self-pay | Admitting: Family Medicine

## 2018-11-14 DIAGNOSIS — M810 Age-related osteoporosis without current pathological fracture: Secondary | ICD-10-CM

## 2018-11-17 ENCOUNTER — Inpatient Hospital Stay: Payer: Medicare Other | Attending: Internal Medicine

## 2018-11-17 ENCOUNTER — Encounter: Payer: Self-pay | Admitting: Internal Medicine

## 2018-11-17 ENCOUNTER — Inpatient Hospital Stay: Payer: Medicare Other | Admitting: Internal Medicine

## 2018-11-17 ENCOUNTER — Telehealth: Payer: Self-pay | Admitting: Internal Medicine

## 2018-11-17 VITALS — BP 149/88 | HR 100 | Temp 98.4°F | Resp 18 | Ht 65.0 in | Wt 117.4 lb

## 2018-11-17 DIAGNOSIS — M25531 Pain in right wrist: Secondary | ICD-10-CM

## 2018-11-17 DIAGNOSIS — M129 Arthropathy, unspecified: Secondary | ICD-10-CM

## 2018-11-17 DIAGNOSIS — C342 Malignant neoplasm of middle lobe, bronchus or lung: Secondary | ICD-10-CM | POA: Insufficient documentation

## 2018-11-17 DIAGNOSIS — J449 Chronic obstructive pulmonary disease, unspecified: Secondary | ICD-10-CM | POA: Diagnosis not present

## 2018-11-17 DIAGNOSIS — J90 Pleural effusion, not elsewhere classified: Secondary | ICD-10-CM | POA: Diagnosis not present

## 2018-11-17 DIAGNOSIS — R21 Rash and other nonspecific skin eruption: Secondary | ICD-10-CM | POA: Diagnosis not present

## 2018-11-17 DIAGNOSIS — C3491 Malignant neoplasm of unspecified part of right bronchus or lung: Secondary | ICD-10-CM

## 2018-11-17 DIAGNOSIS — F329 Major depressive disorder, single episode, unspecified: Secondary | ICD-10-CM | POA: Insufficient documentation

## 2018-11-17 DIAGNOSIS — I10 Essential (primary) hypertension: Secondary | ICD-10-CM | POA: Insufficient documentation

## 2018-11-17 DIAGNOSIS — Z79899 Other long term (current) drug therapy: Secondary | ICD-10-CM

## 2018-11-17 DIAGNOSIS — Z7982 Long term (current) use of aspirin: Secondary | ICD-10-CM | POA: Diagnosis not present

## 2018-11-17 DIAGNOSIS — Z5181 Encounter for therapeutic drug level monitoring: Secondary | ICD-10-CM

## 2018-11-17 DIAGNOSIS — K219 Gastro-esophageal reflux disease without esophagitis: Secondary | ICD-10-CM | POA: Insufficient documentation

## 2018-11-17 LAB — CBC WITH DIFFERENTIAL (CANCER CENTER ONLY)
Abs Immature Granulocytes: 0.02 10*3/uL (ref 0.00–0.07)
Basophils Absolute: 0 10*3/uL (ref 0.0–0.1)
Basophils Relative: 0 %
EOS ABS: 0.3 10*3/uL (ref 0.0–0.5)
Eosinophils Relative: 4 %
HCT: 40.4 % (ref 36.0–46.0)
Hemoglobin: 13.5 g/dL (ref 12.0–15.0)
Immature Granulocytes: 0 %
Lymphocytes Relative: 14 %
Lymphs Abs: 0.8 10*3/uL (ref 0.7–4.0)
MCH: 31.7 pg (ref 26.0–34.0)
MCHC: 33.4 g/dL (ref 30.0–36.0)
MCV: 94.8 fL (ref 80.0–100.0)
MONO ABS: 0.7 10*3/uL (ref 0.1–1.0)
Monocytes Relative: 12 %
Neutro Abs: 4 10*3/uL (ref 1.7–7.7)
Neutrophils Relative %: 70 %
PLATELETS: 252 10*3/uL (ref 150–400)
RBC: 4.26 MIL/uL (ref 3.87–5.11)
RDW: 13.7 % (ref 11.5–15.5)
WBC Count: 5.8 10*3/uL (ref 4.0–10.5)
nRBC: 0 % (ref 0.0–0.2)

## 2018-11-17 LAB — CMP (CANCER CENTER ONLY)
ALBUMIN: 3.8 g/dL (ref 3.5–5.0)
ALK PHOS: 54 U/L (ref 38–126)
ALT: 21 U/L (ref 0–44)
AST: 28 U/L (ref 15–41)
Anion gap: 9 (ref 5–15)
BUN: 21 mg/dL (ref 8–23)
CALCIUM: 9.4 mg/dL (ref 8.9–10.3)
CO2: 27 mmol/L (ref 22–32)
Chloride: 106 mmol/L (ref 98–111)
Creatinine: 0.91 mg/dL (ref 0.44–1.00)
GFR, Est AFR Am: >60 mL/min (ref >60)
GFR, Estimated: 58 mL/min — ABNORMAL LOW (ref >60)
GLUCOSE: 89 mg/dL (ref 70–99)
Potassium: 3.8 mmol/L (ref 3.5–5.1)
Sodium: 142 mmol/L (ref 135–145)
TOTAL PROTEIN: 6.5 g/dL (ref 6.5–8.1)
Total Bilirubin: 1.2 mg/dL (ref 0.3–1.2)

## 2018-11-17 NOTE — Telephone Encounter (Signed)
Printed calendar and avs. °

## 2018-11-17 NOTE — Progress Notes (Signed)
[place] [phone]   Fax:(336) [phone]  OFFICE PROGRESS NOTE  [name], MD [place] [address]  DIAGNOSIS: Stage IIIB (T2a, N3, M0) non-small cell lung cancer, adenocarcinoma with positive EGFR mutation diagnosed in January 2010.  PRIOR THERAPY: 1) Status post concurrent chemoradiation with weekly carboplatin and paclitaxel; last dose given March 21, 2009.  2) Tarceva at 150 mg p.o. daily, status post approximately 48 months of treatment, discontinued secondary to persistent diarrhea.  3) status post right Pleurx catheter placement for right nonmalignant pleural effusion.  CURRENT THERAPY: Tarceva 100 mg by mouth daily started 03/19/2013, status post 68 months of treatment.  INTERVAL HISTORY: Dana [name] 83 y.o. female returns to the clinic today for 2 months follow-up visit.  The patient is feeling fine today with no concerning complaints except for shortness of breath with exertion.  She denied having any chest pain, cough or hemoptysis.  She denied having any recent weight loss or night sweats.  She has no nausea, vomiting, diarrhea or constipation.  She has no headache or visual changes.  She is here today for evaluation and repeat blood work.  MEDICAL HISTORY: Past Medical History:  Diagnosis Date  . Arthritis    HANDS,  WRISTS  . COPD (chronic obstructive pulmonary disease) ([place])   . Depression 04/04/2017  . Dyspnea on exertion   . Encounter for therapeutic drug monitoring 10/01/2016  . GERD (gastroesophageal reflux disease)   . Hemorrhoid   . History of anal fissures   . History of lung cancer ONCOLOGIST--  DR [facility]--  LAST CT ,  NO RECURRENCE OR METS   DX JAN 2010 --  STAGE IIIA  NON-SMALL CELL ADENOCARCINOMA (RIGHT MIDDLE LOBE)---  S/P  CHEMORADIATIO THERAPY  (COMPLETE 03-21-2009)  . HTN (hypertension) 10/10/2017  . Imbalance 06/04/2017  . lung ca dx'd 2010  . Normal cardiac stress test    2007  PER PT   . Osteoporosis   . Rash, skin    RIGHT FOREARM/ HAND  . Right wrist pain    MASS  . Wears glasses     ALLERGIES:  is allergic to amoxicillin.  MEDICATIONS:  Current Outpatient Medications  Medication Sig Dispense Refill  . aspirin EC 81 MG tablet Take 81 mg by mouth daily.    Marland Kitchen augmented betamethasone dipropionate (DIPROLENE-AF) 0.05 % ointment Apply 1 application topically 2 (two) times daily as needed.    . bismuth subsalicylate (PEPTO BISMOL) 262 MG/15ML suspension Take 30 mLs by mouth every three (3) days as needed.    . Calcium Carb-Cholecalciferol (CALCIUM-VITAMIN D) 500-200 MG-UNIT tablet Take by mouth.    . Calcium Carbonate-Vitamin D 600-400 MG-UNIT per chew tablet Chew 1 tablet by mouth 2 (two) times daily.    . cholecalciferol (VITAMIN D) 1000 UNITS tablet Take 1,000 Units by mouth daily.     . clindamycin (CLEOCIN-T) 1 % external solution Apply topically 2 (two) times daily. 30 mL 0  . Coenzyme Q10 (COQ10) 100 MG CAPS Take 1 capsule by mouth daily.     . Cyanocobalamin (B-12) 1000 MCG CAPS Take 1,000 mcg by mouth 3 (three) times daily with meals.     . erlotinib (TARCEVA) 100 MG tablet Take 1 tablet (100 mg total) by mouth daily. Take on an empty stomach 1 hour before meals or 2 hours after 30 tablet 5  . Hydrocodone-Acetaminophen (VICODIN) 5-300 MG TABS Take 1 tablet by mouth 4 (four) times daily  as needed (for pain).     . hydroxychloroquine (PLAQUENIL) 200 MG tablet Take by mouth.    . ibandronate (BONIVA) 150 MG tablet     . loperamide (IMODIUM A-D) 2 MG tablet Take 1 mg by mouth 4 (four) times daily as needed for diarrhea or loose stools.     Marland Kitchen loratadine (CLARITIN) 10 MG tablet Take 10 mg by mouth daily as needed for allergies.     Marland Kitchen LORazepam (ATIVAN) 0.5 MG tablet Take 1 tablet (0.5 mg total) by mouth every 8 (eight) hours as needed for anxiety. 30 tablet 0  . meloxicam (MOBIC) 15 MG tablet Take 15 mg by mouth daily.  4  . mirtazapine (REMERON) 15 MG tablet TAKE 1  TABLET BY MOUTH ONCE DAILY AT BEDTIME 30 tablet 2  . oxyCODONE (OXY IR/ROXICODONE) 5 MG immediate release tablet Take 1 tablet (5 mg total) by mouth every 4 (four) hours as needed for moderate pain. (Patient not taking: Reported on 05/13/2018) 30 tablet 0  . Polyethyl Glycol-Propyl Glycol (SYSTANE OP) Place 1 drop into both eyes at bedtime as needed (for dry eyes.).     Marland Kitchen psyllium (METAMUCIL) 58.6 % powder Take 1 packet by mouth daily as needed.    . Simethicone (GAS-X PO) Take 1 tablet by mouth 2 (two) times daily as needed (for gas).      No current facility-administered medications for this visit.     SURGICAL HISTORY:  Past Surgical History:  Procedure Laterality Date  . ANAL FISSURE REPAIR  07/09/2011   INTERNAL SPHINCTEROTOMY  . BENIGN EXCISION LEFT BREAST CENTRAL DUCT  02/1999  . BREAST BIOPSY  01/31/2009   benign  . BREAST EXCISIONAL BIOPSY Left 2004   benign  . CHEST TUBE INSERTION Right 07/14/2014   Procedure: INSERTION PLEURAL DRAINAGE CATHETER RIGHT CHEST;  Surgeon: Grace Isaac, MD;  Location: [facility];  Service: Thoracic;  Laterality: Right;  . EXCISION RIGHT WRIST MASS  2012  . KNEE ARTHROSCOPY Right 1999  . MASS EXCISION Right 03/11/2014   Procedure: RIGHT WRIST DEEP MASS EXCISION WITH CULTURE AND BIOSPY;  Surgeon: Linna Hoff, MD;  Location: [facility];  Service: Orthopedics;  Laterality: Right;  . REMOVAL OF PLEURAL DRAINAGE CATHETER Right 10/14/2014   Procedure: REMOVAL OF PLEURAL DRAINAGE CATHETER;  Surgeon: Grace Isaac, MD;  Location: [facility];  Service: Thoracic;  Laterality: Right;  . TALC PLEURODESIS Right 09/16/2014   Procedure: TALC PLEURADESIS/ slurry;  Surgeon: Grace Isaac, MD;  Location: [facility];  Service: Thoracic;  Laterality: Right;  . THORACOSCOPY  01-26-2009   w/   lung/  node biopsy's  . THUMB ARTHROSCOPY Left    - removed bone spur  . TONSILLECTOMY  AS CHILD  . TOTAL ABDOMINAL HYSTERECTOMY W/ BILATERAL SALPINGOOPHORECTOMY   1982   W/  APPENDECTOMY  . TRANSTHORACIC ECHOCARDIOGRAM  12-23-2008   NORMAL LV/  EF 65-70%/  MILD MR  &  TR  . VAULT SUSPENSION PLUS CYSTOCELE REPAIR WITH GRAFT  06-13-2010    REVIEW OF SYSTEMS:  A comprehensive review of systems was negative except for: Respiratory: positive for dyspnea on exertion   PHYSICAL EXAMINATION: General appearance: alert, cooperative, fatigued and no distress Head: Normocephalic, without obvious abnormality, atraumatic Neck: no adenopathy, no JVD, supple, symmetrical, trachea midline and thyroid not enlarged, symmetric, no tenderness/mass/nodules Lymph nodes: Cervical, supraclavicular, and axillary nodes normal. Resp: clear to auscultation bilaterally Back: symmetric, no curvature. ROM normal. No CVA tenderness. Cardio: regular rate  and rhythm, S1, S2 normal, no murmur, click, rub or gallop GI: soft, non-tender; bowel sounds normal; no masses,  no organomegaly Extremities: extremities normal, atraumatic, no cyanosis or edema  ECOG PERFORMANCE STATUS: 1 - Symptomatic but completely ambulatory  Blood pressure (!) 149/88, pulse 100, temperature 98.4 F (36.9 C), temperature source Oral, resp. rate 18, height _0  (1.651 m), weight 117 lb 6.4 oz (53.3 kg), SpO2 97 %.  LABORATORY DATA: Lab Results  Component Value Date   WBC 5.8 11/17/2018   HGB 13.5 11/17/2018   HCT 40.4 11/17/2018   MCV 94.8 11/17/2018   PLT 252 11/17/2018      Chemistry      Component Value Date/Time   NA 142 11/17/2018 0931   NA 142 12/10/2017 1000   K 3.8 11/17/2018 0931   K 4.0 12/10/2017 1000   CL 106 11/17/2018 0931   CL 108 (H) 06/03/2013 0939   CO2 27 11/17/2018 0931   CO2 28 12/10/2017 1000   BUN 21 11/17/2018 0931   BUN 20.4 12/10/2017 1000   CREATININE 0.91 11/17/2018 0931   CREATININE 0.9 12/10/2017 1000      Component Value Date/Time   CALCIUM 9.4 11/17/2018 0931   CALCIUM 9.7 12/10/2017 1000   ALKPHOS 54 11/17/2018 0931   ALKPHOS 63 12/10/2017 1000   AST  28 11/17/2018 0931   AST 25 12/10/2017 1000   ALT 21 11/17/2018 0931   ALT 17 12/10/2017 1000   BILITOT 1.2 11/17/2018 0931   BILITOT 1.05 12/10/2017 1000       RADIOGRAPHIC STUDIES: No results found.  ASSESSMENT AND PLAN:  This is a very pleasant 83 years old white female with stage IIIB non-small cell lung cancer, adenocarcinoma with positive EGFR mutation diagnosed in January 2010. She has been on treatment with Tarceva for more than 9 years.   She is currently on Tarceva 100 mg p.o. daily status post 68 months. The patient has been tolerating her treatment well with no concerning adverse effects. Her lab work today is unremarkable. I recommended for her to continue her current treatment with Tarceva with the same dose. For the shortness of breath, I recommended for the patient to get a referral from her primary care physician to see a cardiologist to rule out any cardiac etiology.  Her last imaging studies were unremarkable. I will see her back for follow-up visit in 2 months for evaluation with repeat blood work. She was advised to call if she has any concerning symptoms in the interval. The patient voices understanding of current disease status and treatment options and is in agreement with the current care plan. All questions were answered. The patient knows to call the clinic with any problems, questions or concerns. We can certainly see the patient much sooner if necessary. Disclaimer: This note was dictated with voice recognition software. Similar sounding words can inadvertently be transcribed and may not be corrected upon review.

## 2018-11-18 ENCOUNTER — Other Ambulatory Visit: Payer: Self-pay | Admitting: Internal Medicine

## 2018-11-18 DIAGNOSIS — C3491 Malignant neoplasm of unspecified part of right bronchus or lung: Secondary | ICD-10-CM

## 2018-11-25 ENCOUNTER — Encounter: Payer: Self-pay | Admitting: Internal Medicine

## 2018-11-25 ENCOUNTER — Ambulatory Visit: Payer: Medicare Other | Admitting: Internal Medicine

## 2018-11-25 DIAGNOSIS — R0609 Other forms of dyspnea: Secondary | ICD-10-CM | POA: Diagnosis not present

## 2018-11-25 DIAGNOSIS — R0902 Hypoxemia: Secondary | ICD-10-CM | POA: Diagnosis not present

## 2018-11-25 NOTE — Progress Notes (Signed)
Dana [name], female    DOB: [birth year],    MRN: [id number]    Brief patient profile:  28 yowf quit smoking 1979 with chronic doe prior  evals:   7/13/[name]  Walked RA x 3 laps @ 185 ft each stopped due to  End of study, nl pace no desat or sob  - Spirometry 7/13/[name]  wnl p am spiriva  - CTa chest 7/13/[name] neg pe/ neg ILD, R infrahilar density c/w lung ca vs post RT scar     Oncology note 11/17/18 : DIAGNOSIS: Stage IIIB (T2a, N3, M0) non-small cell lung cancer, adenocarcinoma with positive EGFR mutation diagnosed in January 2010.  PRIOR THERAPY: 1) Status post concurrent chemoradiation with weekly carboplatin and paclitaxel; last dose given March 21, 2009.  2) Tarceva at 150 mg p.o. daily, status post approximately 48 months of treatment, discontinued secondary to persistent diarrhea.  3) status post right Pleurx catheter placement for right nonmalignant pleural effusion. 4) R pleurex Dana [name] / sp pleurodesis  CURRENT THERAPY: Tarceva 100 mg by mouth daily started 03/19/2013, status post 68 months of treatment.   History of Present Illness  11/25/2018  Pulmonary consultation / Dana [name] re sob   Chief Complaint  Patient presents with  . Pulmonary Consult    Referred by Dana [name]. Pt c/o SOB x 10 yrs, worse x 3 months. She gets out of breath walking up a flight of stairs.   Dyspnea:  Indolent onset/ As of 3 months prior to Dana [name] worse" to point where struggles step to step and stops at top of one flight  Still able to shop at [employer] / does not check sats  Cough: none Sleep: fine flat on one pillow SABA use: none    No obvious day to day or daytime variability or assoc excess/ purulent sputum or mucus plugs or hemoptysis or cp or chest tightness, subjective wheeze or overt sinus or hb symptoms.   Sleeping as above  without nocturnal  or early am exacerbation  of respiratory  c/o's or need for noct saba. Also denies any obvious fluctuation  of symptoms with weather or environmental changes or other aggravating or alleviating factors except as outlined above   No unusual exposure hx or h/o childhood pna/ asthma or knowledge of premature birth.  Current Allergies, Complete Past Medical History, Past Surgical History, Family History, and Social History were reviewed in [employer] record.  ROS  The following are not active complaints unless bolded Hoarseness, sore throat, dysphagia, dental problems, itching, sneezing,  nasal congestion or discharge of excess mucus or purulent secretions, ear ache,   fever, chills, sweats, unintended wt loss or wt gain, classically pleuritic or exertional cp,  orthopnea pnd or arm/hand swelling  or leg swelling, presyncope, palpitations, abdominal pain, anorexia, nausea, vomiting, diarrhea  or change in bowel habits or change in bladder habits, change in stools or change in urine, dysuria, hematuria,  rash, arthralgias, visual complaints, headache, numbness, weakness or ataxia or problems with walking or coordination,  change in mood or  memory.           Past Medical History:  Diagnosis Date  . Arthritis    HANDS,  WRISTS  . COPD (chronic obstructive pulmonary disease) ([place])   . Depression 04/04/2017  . Dyspnea on exertion   . Encounter for therapeutic drug monitoring 10/9/[name]  . GERD (gastroesophageal reflux disease)   . Hemorrhoid   . History of anal  fissures   . History of lung cancer ONCOLOGIST--  Dana [name]--  LAST CT ,  NO RECURRENCE OR METS   DX JAN 2010 --  STAGE IIIA  NON-SMALL CELL ADENOCARCINOMA (RIGHT MIDDLE LOBE)---  S/P  CHEMORADIATIO THERAPY  (COMPLETE 03-21-2009)  . HTN (hypertension) 10/10/2017  . Imbalance 06/04/2017  . lung ca dx'd 2010  . Normal cardiac stress test    2007  PER PT  . Osteoporosis   . Rash, skin    RIGHT FOREARM/ HAND  . Right wrist pain    MASS  . Wears glasses     Outpatient Medications Prior to Visit  Medication Sig Dispense  Refill  . aspirin EC 81 MG tablet Take 81 mg by mouth daily.    Marland Kitchen augmented betamethasone dipropionate (DIPROLENE-AF) 0.05 % ointment Apply 1 application topically 2 (two) times daily as needed.    . bismuth subsalicylate (PEPTO BISMOL) 262 MG/15ML suspension Take 30 mLs by mouth every three (3) days as needed.    . Calcium Carb-Cholecalciferol (CALCIUM-VITAMIN D) 500-200 MG-UNIT tablet Take by mouth.    . Calcium Carbonate-Vitamin D 600-400 MG-UNIT per chew tablet Chew 1 tablet by mouth 2 (two) times daily.    . cholecalciferol (VITAMIN D) 1000 UNITS tablet Take 1,000 Units by mouth daily.     . clindamycin (CLEOCIN-T) 1 % external solution Apply topically 2 (two) times daily. 30 mL 0  . Coenzyme Q10 (COQ10) 100 MG CAPS Take 1 capsule by mouth daily.     . Cyanocobalamin (B-12) 1000 MCG CAPS Take 1,000 mcg by mouth 3 (three) times daily with meals.     . erlotinib (TARCEVA) 100 MG tablet Take 1 tablet (100 mg total) by mouth daily. Take on an empty stomach 1 hour before meals or 2 hours after 30 tablet 5  . Hydrocodone-Acetaminophen (VICODIN) 5-300 MG TABS Take 1 tablet by mouth 4 (four) times daily as needed (for pain).     . ibandronate (BONIVA) 150 MG tablet     . loperamide (IMODIUM A-D) 2 MG tablet Take 1 mg by mouth 4 (four) times daily as needed for diarrhea or loose stools.     Marland Kitchen loratadine (CLARITIN) 10 MG tablet Take 10 mg by mouth daily as needed for allergies.     Marland Kitchen LORazepam (ATIVAN) 0.5 MG tablet Take 1 tablet (0.5 mg total) by mouth every 8 (eight) hours as needed for anxiety. 30 tablet 0  . meloxicam (MOBIC) 15 MG tablet Take 15 mg by mouth daily.  4  . mirtazapine (REMERON) 15 MG tablet TAKE 1 TABLET BY MOUTH AT BEDTIME 30 tablet 2  . oxyCODONE (OXY IR/ROXICODONE) 5 MG immediate release tablet Take 1 tablet (5 mg total) by mouth every 4 (four) hours as needed for moderate pain. 30 tablet 0  . Polyethyl Glycol-Propyl Glycol (SYSTANE OP) Place 1 drop into both eyes at bedtime as  needed (for dry eyes.).     Marland Kitchen psyllium (METAMUCIL) 58.6 % powder Take 1 packet by mouth daily as needed.    . Simethicone (GAS-X PO) Take 1 tablet by mouth 2 (two) times daily as needed (for gas).     . hydroxychloroquine (PLAQUENIL) 200 MG tablet Take by mouth.        Objective:     BP 108/68 (BP Location: Left Arm, Cuff Size: Normal)   Pulse 100   Ht '5\' 5"'$  (1.651 m)   Wt 116 lb (52.6 kg)   SpO2 98%   BMI 19.30 kg/m  SpO2: 98 % RA    Wt Readings from Last 3 Encounters:  11/25/18 116 lb (52.6 kg)  11/17/18 117 lb 6.4 oz (53.3 kg)  09/16/18 117 lb 4.8 oz (53.2 kg)     amb somber  wf nad   HEENT: nl dentition, turbinates bilaterally, and oropharynx. Nl external ear canals without cough reflex   NECK :  without JVD/Nodes/TM/ nl carotid upstrokes bilaterally   LUNGS: no acc muscle use,  Nl contour chest which is clear to A and P bilaterally without cough on insp or exp maneuvers   CV:  RRR  no s3 or murmur or increase in P2, and no edema   ABD:  soft and nontender with nl inspiratory excursion in the supine position. No bruits or organomegaly appreciated, bowel sounds nl  MS:  Nl gait/ ext warm with RA deformities R> L wrist , calf tenderness, cyanosis or clubbing No obvious joint restrictions   SKIN: warm and dry without lesions    NEURO:  alert, approp, nl sensorium with  no motor or cerebellar deficits apparent.     Labs   Reviewed 11/25/2018       Chemistry      Component Value Date/Time   NA 142 11/17/2018 0931   NA 142 12/10/2017 1000   K 3.8 11/17/2018 0931   K 4.0 12/10/2017 1000   CL 106 11/17/2018 0931   CL 108 (H) 06/03/2013 0939   CO2 27 11/17/2018 0931   CO2 28 12/10/2017 1000   BUN 21 11/17/2018 0931   BUN 20.4 12/10/2017 1000   CREATININE 0.91 11/17/2018 0931   CREATININE 0.9 12/10/2017 1000      Component Value Date/Time   CALCIUM 9.4 11/17/2018 0931   CALCIUM 9.7 12/10/2017 1000   ALKPHOS 54 11/17/2018 0931   ALKPHOS 63 12/10/2017  1000   AST 28 11/17/2018 0931   AST 25 12/10/2017 1000   ALT 21 11/17/2018 0931   ALT 17 12/10/2017 1000   BILITOT 1.2 11/17/2018 0931   BILITOT 1.05 12/10/2017 1000        Lab Results  Component Value Date   WBC 5.8 11/17/2018   HGB 13.5 11/17/2018   HCT 40.4 11/17/2018   MCV 94.8 11/17/2018   PLT 252 11/17/2018       Lab Results  Component Value Date   TSH 3.68 07/13/[name]     Lab Results  Component Value Date   PROBNP 98.0 07/13/[name]              I personally reviewed images and agree with radiology impression as follows:   Chest CT w contrast  09/12/18 1. Stable CT of the chest. 2. No change in scratch set stable part solid nodule within the left upper lobe measuring 1.4 cm with a 4 mm solid component. Other nodules are stable. 3. No new or progressive metastatic disease within the chest.     Assessment   DOE (dyspnea on exertion) 7/13/[name]  Walked RA x 3 laps @ 185 ft each stopped due to  End of study, nl pace no desat or sob  - Spirometry 7/13/[name]  wnl p am spiriva  - CTa chest 7/13/[name] neg pe/ neg ILD, R infrahilar density c/w lung ca vs post RT scar   Chest CT w contrast  09/12/18  Stable CT of the chest - 11/25/2018   Walked RA  2 laps @ 27f each @ moderate pace  stopped due to  desats to 86% corrected on 2lpm  Not clear from hx or exam or CT chest  why doe gradually worse esp over last 3 m  And also  not clear to me the desaturation is a new finding but she is not really happy about using amb 02 and so rec  1) either walk at a slow pace that she usually does or wear 02 and adjust to keep > 90% and see how much better she feels with exertion   2) return for full pfts next available to compare to prev studies but hold trial of bronchodilators until then based on clear exam and no assoc coughing       Exercise hypoxemia See walking study 11/25/2018 > 02 ordered by PCP   See doe     I had an extended discussion with the patient reviewing  all relevant studies completed to date and  lasting 25 minutes of a 40  minute offic visit to re-establish with me after sev years absence     re  severe non-specific but potentially very serious refractory respiratory symptoms of uncertain and potentially multiple  Etiologies.  See  Directly observed ambulatory 02 study  which extended face to face time for this visit    Each maintenance medication was reviewed in detail including most importantly the difference between maintenance and prns and under what circumstances the prns are to be triggered using an action plan format that is not reflected in the computer generated alphabetically organized AVS.    Please see AVS for specific instructions unique to this office visit that I personally wrote and verbalized to the the pt in detail and then reviewed with pt  by my nurse highlighting any changes in therapy/plan of care  recommended at today's visit.              Christinia Gully, MD 11/25/2018

## 2018-11-25 NOTE — Patient Instructions (Signed)
Goal is to keep your 02 saturation above 90% by walking slower or adjusting 02 flow to reach at least 90%    We will see you back here next available for full pfts

## 2018-11-26 ENCOUNTER — Encounter: Payer: Self-pay | Admitting: Internal Medicine

## 2018-11-26 ENCOUNTER — Telehealth: Payer: Self-pay | Admitting: *Deleted

## 2018-11-26 DIAGNOSIS — J849 Interstitial pulmonary disease, unspecified: Secondary | ICD-10-CM

## 2018-11-26 DIAGNOSIS — R0902 Hypoxemia: Secondary | ICD-10-CM | POA: Insufficient documentation

## 2018-11-26 NOTE — Telephone Encounter (Signed)
-----   Message from Tanda Rockers, MD sent at 11/26/2018  5:06 PM EST ----- Let pt know that after a review of the literature and her oncology record she may be having a reaction to Tarceva so rec she hold it for now and get a HRCT chest this week  Dx hypoxia   I will let Dr [name] know we did this subject to the results on ct

## 2018-11-26 NOTE — Telephone Encounter (Signed)
Spoke with pt and notified of recs per Dr. Melvyn Novas. Pt verbalized understanding and denied any questions. HRCT ordered

## 2018-11-26 NOTE — Assessment & Plan Note (Addendum)
See walking study 11/25/2018 > 02 ordered by PCP   See doe     I had an extended discussion with the patient reviewing all relevant studies completed to date and  lasting 25 minutes of a 40  minute offic visit to re-establish with me after sev years absence     re  severe non-specific but potentially very serious refractory respiratory symptoms of uncertain and potentially multiple  Etiologies.  See  Directly observed ambulatory 02 study  which extended face to face time for this visit    Each maintenance medication was reviewed in detail including most importantly the difference between maintenance and prns and under what circumstances the prns are to be triggered using an action plan format that is not reflected in the computer generated alphabetically organized AVS.    Please see AVS for specific instructions unique to this office visit that I personally wrote and verbalized to the the pt in detail and then reviewed with pt  by my nurse highlighting any changes in therapy/plan of care  recommended at today's visit.

## 2018-11-26 NOTE — Assessment & Plan Note (Addendum)
07/05/2016  Walked RA x 3 laps @ 185 ft each stopped due to  End of study, nl pace no desat or sob  - Spirometry 07/05/2016  wnl p am spiriva  - CTa chest 07/05/2016 neg pe/ neg ILD, R infrahilar density c/w lung ca vs post RT scar   Chest CT w contrast  09/12/18  Stable CT of the chest - 11/25/2018   Walked RA  2 laps @ 235ft each @ moderate pace  stopped due to  desats to 86% corrected on 2lpm   Not clear from hx or exam or CT chest  why doe gradually worse esp over last 3 m  And also  not clear to me the desaturation is a new finding but she is not really happy about using amb 02 and so rec  1) either walk at a slow pace that she usually does or wear 02 and adjust to keep > 90% and see how much better she feels with exertion   2) return for full pfts next available to compare to prev studies but hold trial of bronchodilators until then based on clear exam and no assoc coughing    Late add:  After review of most recent CT (which  is now over 2 months ago) and literature on tarceva concerned she may have the rare version of tarceva pulmonary toxicity that occurs later in therapy so need new HRCT and f/u pfts as planned but hold the tarceva for now as there is a risk of permanent lung injury if this turns out to be the case here.

## 2018-11-27 ENCOUNTER — Telehealth: Payer: Self-pay | Admitting: Internal Medicine

## 2018-11-27 ENCOUNTER — Other Ambulatory Visit: Payer: Self-pay | Admitting: Internal Medicine

## 2018-11-27 ENCOUNTER — Inpatient Hospital Stay: Admission: RE | Admit: 2018-11-27 | Payer: Medicare Other | Source: Ambulatory Visit

## 2018-11-27 DIAGNOSIS — J849 Interstitial pulmonary disease, unspecified: Secondary | ICD-10-CM

## 2018-11-27 NOTE — Telephone Encounter (Signed)
Spoke with pt, had some concerns about why she is stopping the Tarceva.  I re-addressed issues listed in 12/4 office note.  Pt expressed understanding with no additional questions.  Will close encounter.

## 2018-11-28 ENCOUNTER — Ambulatory Visit (HOSPITAL_COMMUNITY)
Admission: RE | Admit: 2018-11-28 | Discharge: 2018-11-28 | Disposition: A | Discharge status: Home / Self Care | Payer: Medicare Other | Source: Ambulatory Visit | Attending: Internal Medicine | Admitting: Internal Medicine

## 2018-11-28 DIAGNOSIS — J849 Interstitial pulmonary disease, unspecified: Secondary | ICD-10-CM | POA: Insufficient documentation

## 2018-12-02 ENCOUNTER — Other Ambulatory Visit: Payer: Self-pay | Admitting: Internal Medicine

## 2018-12-02 ENCOUNTER — Telehealth: Payer: Self-pay | Admitting: Medical Oncology

## 2018-12-02 DIAGNOSIS — R0609 Other forms of dyspnea: Principal | ICD-10-CM

## 2018-12-02 NOTE — Telephone Encounter (Signed)
She is asking if she needs to restart Tarceva now or after PFTS? Message to [place].

## 2018-12-03 ENCOUNTER — Ambulatory Visit: Payer: Medicare Other | Admitting: Internal Medicine

## 2018-12-03 ENCOUNTER — Encounter: Payer: Self-pay | Admitting: Internal Medicine

## 2018-12-03 ENCOUNTER — Ambulatory Visit (INDEPENDENT_AMBULATORY_CARE_PROVIDER_SITE_OTHER): Payer: Medicare Other | Admitting: Internal Medicine

## 2018-12-03 VITALS — BP 132/78 | HR 100 | Ht 62.5 in | Wt 120.0 lb

## 2018-12-03 DIAGNOSIS — R0609 Other forms of dyspnea: Secondary | ICD-10-CM | POA: Diagnosis not present

## 2018-12-03 DIAGNOSIS — R0902 Hypoxemia: Secondary | ICD-10-CM | POA: Diagnosis not present

## 2018-12-03 LAB — PULMONARY FUNCTION TEST
DL/VA % pred: 80 %
DL/VA: 3.69 ml/min/mmHg/L
DLCO cor % pred: 52 %
DLCO cor: 11.59 ml/min/mmHg
DLCO unc % pred: 52 %
DLCO unc: 11.63 ml/min/mmHg
FEF 25-75 POST: 1.88 L/s
FEF 25-75 Pre: 0.95 L/sec
FEF2575-%Change-Post: 98 %
FEF2575-%Pred-Post: 147 %
FEF2575-%Pred-Pre: 74 %
FEV1-%CHANGE-POST: 18 %
FEV1-%Pred-Post: 86 %
FEV1-%Pred-Pre: 72 %
FEV1-Post: 1.53 L
FEV1-Pre: 1.28 L
FEV1FVC-%Change-Post: 7 %
FEV1FVC-%Pred-Pre: 100 %
FEV6-%Change-Post: 11 %
FEV6-%Pred-Post: 85 %
FEV6-%Pred-Pre: 76 %
FEV6-Post: 1.91 L
FEV6-Pre: 1.71 L
FEV6FVC-%Change-Post: 1 %
FEV6FVC-%Pred-Post: 106 %
FEV6FVC-%Pred-Pre: 104 %
FVC-%Change-Post: 10 %
FVC-%Pred-Post: 80 %
FVC-%Pred-Pre: 72 %
FVC-POST: 1.91 L
FVC-Pre: 1.73 L
Post FEV1/FVC ratio: 80 %
Post FEV6/FVC ratio: 100 %
Pre FEV1/FVC ratio: 74 %
Pre FEV6/FVC Ratio: 99 %
RV % pred: 76 %
RV: 1.78 L
TLC % PRED: 72 %
TLC: 3.5 L

## 2018-12-03 MED ORDER — UMECLIDINIUM-VILANTEROL 62.5-25 MCG/INH IN AEPB: 1.0000 | INHALATION_SPRAY | Freq: Every day | RESPIRATORY_TRACT | 11 refills | Status: DC

## 2018-12-03 MED ORDER — UMECLIDINIUM-VILANTEROL 62.5-25 MCG/INH IN AEPB: 1.0000 | INHALATION_SPRAY | Freq: Every day | RESPIRATORY_TRACT | 0 refills | Status: DC

## 2018-12-03 NOTE — Progress Notes (Signed)
PFT completed today.  

## 2018-12-03 NOTE — Progress Notes (Signed)
 Dana [name], female    DOB: 06/03/1935,    MRN: [id number]    Brief patient profile:  83 yowf quit smoking 1979 with chronic doe prior  evals:   07/05/2016  Walked RA x 3 laps @ 185 ft each stopped due to  End of study, nl pace no desat or sob  - Spirometry 07/05/2016  wnl p am spiriva  - CTa chest 07/05/2016 neg pe/ neg ILD, R infrahilar density c/w lung ca vs post RT scar     Oncology note 11/17/18 : DIAGNOSIS: Stage IIIB (T2a, N3, M0) non-small cell lung cancer, adenocarcinoma with positive EGFR mutation diagnosed in January 2010.  PRIOR THERAPY: 1) Status post concurrent chemoradiation with weekly carboplatin and paclitaxel; last dose given March 21, 2009.  2) Tarceva at 150 mg p.o. daily, status post approximately 48 months of treatment, discontinued secondary to persistent diarrhea.  3) status post right Pleurx catheter placement for right nonmalignant pleural effusion. 4) R pleurex Gerhardt 2017 / sp pleurodesis  CURRENT THERAPY: Tarceva 100 mg by mouth daily started 03/19/2013, status post 68 months of treatment.   History of Present Illness  11/25/2018  Pulmonary consultation / Dr Mc Neil re sob   Chief Complaint  Patient presents with  . Pulmonary Consult    Referred by [name] McNeil. Pt c/o SOB x 10 yrs, worse x 3 months. She gets out of breath walking up a flight of stairs.   Dyspnea:  Indolent onset/ As of 3 months prior to OV  Doe"Inching worse" to point where struggles step to step and stops at top of one flight  Still able to shop at [employer] pushing cart / does not check sats  Cough: none Sleep: fine flat on one pillow SABA use: none  rec Goal is to keep your 02 saturation above 90% by walking slower or adjusting 02 flow to reach at least 90%      12/03/2018  f/u ov/Wert re: sob Chief Complaint  Patient presents with  . Follow-up    She states breathing seems better but she has not been as active. She was started on o2 per PCP- she only uses when she is  at home during the day, none with sleep.   Dyspnea:  Has not tried steps yet but seems easier room to room 2lpm and  Typically maintaining > 90% when she checks it  Cough: none Sleeping: fine  SABA use: none 02: 2lpm prn daytime not at hs   No obvious day to day or daytime variability or assoc excess/ purulent sputum or mucus plugs or hemoptysis or cp or chest tightness, subjective wheeze or overt sinus or hb symptoms.   Sleeping as above without nocturnal  or early am exacerbation  of respiratory  c/o's or need for noct saba. Also denies any obvious fluctuation of symptoms with weather or environmental changes or other aggravating or alleviating factors except as outlined above   No unusual exposure hx or h/o childhood pna/ asthma or knowledge of premature birth.  Current Allergies, Complete Past Medical History, Past Surgical History, Family History, and Social History were reviewed in [facility] electronic medical record.  ROS  The following are not active complaints unless bolded Hoarseness, sore throat, dysphagia, dental problems, itching, sneezing,  nasal congestion or discharge of excess mucus or purulent secretions, ear ache,   fever, chills, sweats, unintended wt loss or wt gain, classically pleuritic or exertional cp,  orthopnea pnd or arm/hand swelling  or leg swelling,   presyncope, palpitations, abdominal pain, anorexia, nausea, vomiting, diarrhea  or change in bowel habits or change in bladder habits, change in stools or change in urine, dysuria, hematuria,  rash, arthralgias, visual complaints, headache, numbness, weakness or ataxia or problems with walking or coordination,  change in mood or  memory.        Current Meds  Medication Sig  . aspirin EC 81 MG tablet Take 81 mg by mouth daily.  . augmented betamethasone dipropionate (DIPROLENE-AF) 0.05 % ointment Apply 1 application topically 2 (two) times daily as needed.  . bismuth subsalicylate (PEPTO BISMOL) 262 MG/15ML  suspension Take 30 mLs by mouth every three (3) days as needed.  . Calcium Carb-Cholecalciferol (CALCIUM-VITAMIN D) 500-200 MG-UNIT tablet Take by mouth.  . Calcium Carbonate-Vitamin D 600-400 MG-UNIT per chew tablet Chew 1 tablet by mouth 2 (two) times daily.  . cholecalciferol (VITAMIN D) 1000 UNITS tablet Take 1,000 Units by mouth daily.   . clindamycin (CLEOCIN-T) 1 % external solution Apply topically 2 (two) times daily.  . Coenzyme Q10 (COQ10) 100 MG CAPS Take 1 capsule by mouth daily.   . Cyanocobalamin (B-12) 1000 MCG CAPS Take 1,000 mcg by mouth 3 (three) times daily with meals.   . erlotinib (TARCEVA) 100 MG tablet Take 1 tablet (100 mg total) by mouth daily. Take on an empty stomach 1 hour before meals or 2 hours after  . Hydrocodone-Acetaminophen (VICODIN) 5-300 MG TABS Take 1 tablet by mouth 4 (four) times daily as needed (for pain).   . hydroxychloroquine (PLAQUENIL) 200 MG tablet Take by mouth.  . ibandronate (BONIVA) 150 MG tablet   . loperamide (IMODIUM A-D) 2 MG tablet Take 1 mg by mouth 4 (four) times daily as needed for diarrhea or loose stools.   . loratadine (CLARITIN) 10 MG tablet Take 10 mg by mouth daily as needed for allergies.   . LORazepam (ATIVAN) 0.5 MG tablet Take 1 tablet (0.5 mg total) by mouth every 8 (eight) hours as needed for anxiety.  . meloxicam (MOBIC) 15 MG tablet Take 15 mg by mouth daily.  . mirtazapine (REMERON) 15 MG tablet TAKE 1 TABLET BY MOUTH AT BEDTIME  . oxyCODONE (OXY IR/ROXICODONE) 5 MG immediate release tablet Take 1 tablet (5 mg total) by mouth every 4 (four) hours as needed for moderate pain.  . Polyethyl Glycol-Propyl Glycol (SYSTANE OP) Place 1 drop into both eyes at bedtime as needed (for dry eyes.).   . psyllium (METAMUCIL) 58.6 % powder Take 1 packet by mouth daily as needed.  . Simethicone (GAS-X PO) Take 1 tablet by mouth 2 (two) times daily as needed (for gas).             Objective:         12/03/2018      120  11/25/18  116 lb (52.6 kg)  11/17/18 117 lb 6.4 oz (53.3 kg)  09/16/18 117 lb 4.8 oz (53.2 kg)     amb somber  wf nad    Vital signs reviewed - Note on arrival 02 sats  97% on RA       HEENT: nl dentition, turbinates bilaterally, and oropharynx. Nl external ear canals without cough reflex   NECK :  without JVD/Nodes/TM/ nl carotid upstrokes bilaterally   LUNGS: no acc muscle use,  Nl contour chest with slt decrease in bs R > L base    without cough on insp or exp maneuvers   CV:  RRR  no s3 or murmur or   increase in P2, and no edema   ABD:  soft and nontender with nl inspiratory excursion in the supine position. No bruits or organomegaly appreciated, bowel sounds nl  MS:  Nl gait/ ext warm with RA changes R > L wrist , calf tenderness, cyanosis or clubbing No obvious joint restrictions   SKIN: warm and dry without lesions    NEURO:  alert, approp, nl sensorium with  no motor or cerebellar deficits apparent.       I personally reviewed images and agree with radiology impression as follows:   Chest HRCT 11/28/18  - HRCT  11/28/18 1. No findings to suggest interstitial lung disease. 2. Chronic postradiation changes in the right lung and evidence of prior right-sided pleurodesis redemonstrated, as above. 3. Two nodules in the left lung appear stable compared to recent prior examinations      Assessment          

## 2018-12-03 NOTE — Patient Instructions (Signed)
Start anoro one click each am  - take two good deep drags then rinse and gargle    Adjust 02 saturations to keep it above 90%    Please schedule a follow up office visit in 6 weeks, call sooner if needed

## 2018-12-04 ENCOUNTER — Encounter: Payer: Self-pay | Admitting: Internal Medicine

## 2018-12-04 NOTE — Assessment & Plan Note (Signed)
Chest CT w contrast  09/12/18  Stable CT of the chest - 11/25/2018   [facility] RA  2 laps @ 292ft each @ moderate pace  stopped due to  desats to 86% corrected on 2lpm  - HRCT  11/28/18 1. No findings to suggest interstitial lung disease. 2. Chronic postradiation changes in the right lung and evidence of prior right-sided pleurodesis redemonstrated, as above. 3. Two nodules in the left lung appear stable compared to recent prior examinations - PFT's  12/03/2018  FEV1 1.53 (86 % ) ratio 80  p 18 % improvement from saba p nothing prior to study with DLCO  52/52 % corrects to 80 % for alv volume  With minimal curvature    The PFTs are consistent with restrictive process that is very mild but also suggestive there is a reversible airflow component despite minimal curvature on the flow volume loop which is a confusing pattern.  I think it is reasonable to try her on Anoro sample to see to what extent she improves her exercise tolerance beyond what has happened with the oxygen she is already using as needed with activity (see separate a/p)   - The proper method of use, as well as anticipated side effects, of a elipta inhaler are discussed and demonstrated to the patient. Improved effectiveness after extensive coaching during this visit to a level of approximately 75 % from a baseline of 90 %

## 2018-12-04 NOTE — Assessment & Plan Note (Signed)
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  I recommended she monitor her oxygen levels with activity so she can compare apples to apples going forward as far as what activity requires what amount of oxygen with additional work-up needed if she notices a reduction in saturations using the same amount of oxygen for  the same activity.   >> f/u in 6 weeks   I had an extended discussion with the patient reviewing all relevant studies completed to date and  lasting 15 to 20 minutes of a 25 minute visit    See device teaching which extended face to face time for this visit.  Each maintenance medication was reviewed in detail including emphasizing most importantly the difference between maintenance and prns and under what circumstances the prns are to be triggered using an action plan format that is not reflected in the computer generated alphabetically organized AVS which I have not found useful in most complex patients, especially with respiratory illnesses  Please see AVS for specific instructions unique to this visit that I personally wrote and verbalized to the the pt in detail and then reviewed with pt  by my nurse highlighting any  changes in therapy recommended at today's visit to their plan of care.   What I say

## 2018-12-06 NOTE — Telephone Encounter (Signed)
Let us wait and see what her final evaluation with Dr. Melvyn Novas regarding her PFTs before we start [place].

## 2018-12-09 ENCOUNTER — Telehealth: Payer: Self-pay | Admitting: Medical Oncology

## 2018-12-09 NOTE — Telephone Encounter (Signed)
Per [name] I told her to wait and see what her final evaluation with Dr. Melvyn Novas regarding her PFTs before we start Dana [name].

## 2019-01-05 ENCOUNTER — Other Ambulatory Visit: Payer: Self-pay | Admitting: *Deleted

## 2019-01-05 DIAGNOSIS — C3491 Malignant neoplasm of unspecified part of right bronchus or lung: Secondary | ICD-10-CM

## 2019-01-05 MED ORDER — ERLOTINIB HCL 100 MG PO TABS: 100.0000 mg | ORAL_TABLET | Freq: Every day | ORAL | 5 refills | Status: DC

## 2019-01-05 NOTE — Telephone Encounter (Signed)
Tarceva refilled.

## 2019-01-07 ENCOUNTER — Telehealth: Payer: Self-pay | Admitting: *Deleted

## 2019-01-07 NOTE — Telephone Encounter (Signed)
call to [place], in pharmacy who advised pt medication is being shipped to pt

## 2019-01-13 ENCOUNTER — Inpatient Hospital Stay: Payer: Medicare Other | Admitting: Internal Medicine

## 2019-01-13 ENCOUNTER — Telehealth: Payer: Self-pay | Admitting: Internal Medicine

## 2019-01-13 ENCOUNTER — Inpatient Hospital Stay: Payer: Medicare Other | Attending: Internal Medicine

## 2019-01-13 ENCOUNTER — Encounter: Payer: Self-pay | Admitting: Internal Medicine

## 2019-01-13 VITALS — BP 151/77 | HR 89 | Temp 97.8°F | Resp 20 | Ht 62.0 in | Wt 117.5 lb

## 2019-01-13 DIAGNOSIS — K219 Gastro-esophageal reflux disease without esophagitis: Secondary | ICD-10-CM

## 2019-01-13 DIAGNOSIS — M129 Arthropathy, unspecified: Secondary | ICD-10-CM

## 2019-01-13 DIAGNOSIS — C342 Malignant neoplasm of middle lobe, bronchus or lung: Secondary | ICD-10-CM

## 2019-01-13 DIAGNOSIS — Z5181 Encounter for therapeutic drug level monitoring: Secondary | ICD-10-CM

## 2019-01-13 DIAGNOSIS — R21 Rash and other nonspecific skin eruption: Secondary | ICD-10-CM | POA: Insufficient documentation

## 2019-01-13 DIAGNOSIS — I10 Essential (primary) hypertension: Secondary | ICD-10-CM | POA: Insufficient documentation

## 2019-01-13 DIAGNOSIS — Z79899 Other long term (current) drug therapy: Secondary | ICD-10-CM | POA: Diagnosis not present

## 2019-01-13 DIAGNOSIS — J90 Pleural effusion, not elsewhere classified: Secondary | ICD-10-CM | POA: Diagnosis not present

## 2019-01-13 DIAGNOSIS — Z9221 Personal history of antineoplastic chemotherapy: Secondary | ICD-10-CM | POA: Diagnosis not present

## 2019-01-13 DIAGNOSIS — C3491 Malignant neoplasm of unspecified part of right bronchus or lung: Secondary | ICD-10-CM

## 2019-01-13 DIAGNOSIS — M810 Age-related osteoporosis without current pathological fracture: Secondary | ICD-10-CM | POA: Diagnosis not present

## 2019-01-13 DIAGNOSIS — Z923 Personal history of irradiation: Secondary | ICD-10-CM

## 2019-01-13 LAB — CMP (CANCER CENTER ONLY)
ALT: 24 U/L (ref 0–44)
AST: 29 U/L (ref 15–41)
Albumin: 3.5 g/dL (ref 3.5–5.0)
Alkaline Phosphatase: 47 U/L (ref 38–126)
Anion gap: 9 (ref 5–15)
BILIRUBIN TOTAL: 1 mg/dL (ref 0.3–1.2)
BUN: 27 mg/dL — ABNORMAL HIGH (ref 8–23)
CO2: 27 mmol/L (ref 22–32)
Calcium: 9.2 mg/dL (ref 8.9–10.3)
Chloride: 106 mmol/L (ref 98–111)
Creatinine: 0.96 mg/dL (ref 0.44–1.00)
GFR, EST NON AFRICAN AMERICAN: 55 mL/min — AB (ref >60)
Glucose, Bld: 86 mg/dL (ref 70–99)
Potassium: 3.9 mmol/L (ref 3.5–5.1)
Sodium: 142 mmol/L (ref 135–145)
TOTAL PROTEIN: 6 g/dL — AB (ref 6.5–8.1)

## 2019-01-13 LAB — CBC WITH DIFFERENTIAL (CANCER CENTER ONLY)
Abs Immature Granulocytes: 0.01 10*3/uL (ref 0.00–0.07)
Basophils Absolute: 0 10*3/uL (ref 0.0–0.1)
Basophils Relative: 0 %
Eosinophils Absolute: 0.3 10*3/uL (ref 0.0–0.5)
Eosinophils Relative: 6 %
HCT: 37.3 % (ref 36.0–46.0)
Hemoglobin: 12.6 g/dL (ref 12.0–15.0)
Immature Granulocytes: 0 %
Lymphocytes Relative: 14 %
Lymphs Abs: 0.8 10*3/uL (ref 0.7–4.0)
MCH: 32 pg (ref 26.0–34.0)
MCHC: 33.8 g/dL (ref 30.0–36.0)
MCV: 94.7 fL (ref 80.0–100.0)
Monocytes Absolute: 0.5 10*3/uL (ref 0.1–1.0)
Monocytes Relative: 10 %
Neutro Abs: 3.7 10*3/uL (ref 1.7–7.7)
Neutrophils Relative %: 70 %
Platelet Count: 233 10*3/uL (ref 150–400)
RBC: 3.94 MIL/uL (ref 3.87–5.11)
RDW: 13.4 % (ref 11.5–15.5)
WBC Count: 5.3 10*3/uL (ref 4.0–10.5)
nRBC: 0 % (ref 0.0–0.2)

## 2019-01-13 NOTE — Telephone Encounter (Signed)
Gave patient avs report and appointments for March  °

## 2019-01-13 NOTE — Progress Notes (Signed)
[place] [phone]   [phone]  OFFICE PROGRESS NOTE  [name], MD [place] [address]  DIAGNOSIS: Stage IIIB (T2a, N3, M0) non-small cell lung cancer, adenocarcinoma with positive EGFR mutation diagnosed in January 2010.  PRIOR THERAPY: 1) Status post concurrent chemoradiation with weekly carboplatin and paclitaxel; last dose given March 21, 2009.  2) Tarceva at 150 mg p.o. daily, status post approximately 48 months of treatment, discontinued secondary to persistent diarrhea.  3) status post right Pleurx catheter placement for right nonmalignant pleural effusion.  CURRENT THERAPY: Tarceva 100 mg by mouth daily started 03/19/2013, status post 70 months of treatment.  INTERVAL HISTORY: Dana [name] 84 y.o. female returns to the clinic today for follow-up visit.  The patient is feeling much better today.  She denied having any current chest pain, shortness of breath, cough or hemoptysis.  She denied having any fever or chills.  She has no nausea, vomiting, diarrhea or constipation.  She denied having any headache or visual changes.  She was seen by Dr. Melvyn Novas months ago and started on treatment with Anora.  She felt much better after starting this treatment.  She is here today for evaluation and repeat blood work.  MEDICAL HISTORY: Past Medical History:  Diagnosis Date  . Arthritis    HANDS,  WRISTS  . COPD (chronic obstructive pulmonary disease) ([place])   . Depression 04/04/2017  . Dyspnea on exertion   . Encounter for therapeutic drug monitoring 10/01/2016  . GERD (gastroesophageal reflux disease)   . Hemorrhoid   . History of anal fissures   . History of lung cancer ONCOLOGIST--  DR [facility]--  LAST CT ,  NO RECURRENCE OR METS   DX JAN 2010 --  STAGE IIIA  NON-SMALL CELL ADENOCARCINOMA (RIGHT MIDDLE LOBE)---  S/P  CHEMORADIATIO THERAPY  (COMPLETE 03-21-2009)  . HTN (hypertension) 10/10/2017  . Imbalance  06/04/2017  . lung ca dx'd 2010  . Normal cardiac stress test    2007  PER PT  . Osteoporosis   . Rash, skin    RIGHT FOREARM/ HAND  . Right wrist pain    MASS  . Wears glasses     ALLERGIES:  is allergic to amoxicillin.  MEDICATIONS:  Current Outpatient Medications  Medication Sig Dispense Refill  . aspirin EC 81 MG tablet Take 81 mg by mouth daily.    Marland Kitchen augmented betamethasone dipropionate (DIPROLENE-AF) 0.05 % ointment Apply 1 application topically 2 (two) times daily as needed.    . bismuth subsalicylate (PEPTO BISMOL) 262 MG/15ML suspension Take 30 mLs by mouth every three (3) days as needed.    . Calcium Carb-Cholecalciferol (CALCIUM-VITAMIN D) 500-200 MG-UNIT tablet Take by mouth.    . Calcium Carbonate-Vitamin D 600-400 MG-UNIT per chew tablet Chew 1 tablet by mouth 2 (two) times daily.    . cholecalciferol (VITAMIN D) 1000 UNITS tablet Take 1,000 Units by mouth daily.     . clindamycin (CLEOCIN-T) 1 % external solution Apply topically 2 (two) times daily. 30 mL 0  . Coenzyme Q10 (COQ10) 100 MG CAPS Take 1 capsule by mouth daily.     . Cyanocobalamin (B-12) 1000 MCG CAPS Take 1,000 mcg by mouth 3 (three) times daily with meals.     . erlotinib (TARCEVA) 100 MG tablet Take 1 tablet (100 mg total) by mouth daily. Take on an empty stomach 1 hour before meals or 2 hours after 30 tablet 5  .  hydroxychloroquine (PLAQUENIL) 200 MG tablet Take by mouth.    . ibandronate (BONIVA) 150 MG tablet     . loratadine (CLARITIN) 10 MG tablet Take 10 mg by mouth daily as needed for allergies.     . meloxicam (MOBIC) 15 MG tablet Take 15 mg by mouth daily.  4  . mirtazapine (REMERON) 15 MG tablet TAKE 1 TABLET BY MOUTH AT BEDTIME 30 tablet 2  . Polyethyl Glycol-Propyl Glycol (SYSTANE OP) Place 1 drop into both eyes at bedtime as needed (for dry eyes.).     Marland Kitchen psyllium (METAMUCIL) 58.6 % powder Take 1 packet by mouth daily as needed.    . umeclidinium-vilanterol (ANORO ELLIPTA) 62.5-25 MCG/INH  AEPB Inhale 1 puff into the lungs daily. 60 each 11  . Hydrocodone-Acetaminophen (VICODIN) 5-300 MG TABS Take 1 tablet by mouth 4 (four) times daily as needed (for pain).     Marland Kitchen loperamide (IMODIUM A-D) 2 MG tablet Take 1 mg by mouth 4 (four) times daily as needed for diarrhea or loose stools.     Marland Kitchen LORazepam (ATIVAN) 0.5 MG tablet Take 1 tablet (0.5 mg total) by mouth every 8 (eight) hours as needed for anxiety. (Patient not taking: Reported on 01/13/2019) 30 tablet 0  . oxyCODONE (OXY IR/ROXICODONE) 5 MG immediate release tablet Take 1 tablet (5 mg total) by mouth every 4 (four) hours as needed for moderate pain. (Patient not taking: Reported on 01/13/2019) 30 tablet 0  . Simethicone (GAS-X PO) Take 1 tablet by mouth 2 (two) times daily as needed (for gas).      No current facility-administered medications for this visit.     SURGICAL HISTORY:  Past Surgical History:  Procedure Laterality Date  . ANAL FISSURE REPAIR  07/09/2011   INTERNAL SPHINCTEROTOMY  . BENIGN EXCISION LEFT BREAST CENTRAL DUCT  02/1999  . BREAST BIOPSY  01/31/2009   benign  . BREAST EXCISIONAL BIOPSY Left 2004   benign  . CHEST TUBE INSERTION Right 07/14/2014   Procedure: INSERTION PLEURAL DRAINAGE CATHETER RIGHT CHEST;  Surgeon: Grace Isaac, MD;  Location: [place];  Service: Thoracic;  Laterality: Right;  . EXCISION RIGHT WRIST MASS  2012  . KNEE ARTHROSCOPY Right 1999  . MASS EXCISION Right 03/11/2014   Procedure: RIGHT WRIST DEEP MASS EXCISION WITH CULTURE AND BIOSPY;  Surgeon: Linna Hoff, MD;  Location: [place];  Service: Orthopedics;  Laterality: Right;  . REMOVAL OF PLEURAL DRAINAGE CATHETER Right 10/14/2014   Procedure: REMOVAL OF PLEURAL DRAINAGE CATHETER;  Surgeon: Grace Isaac, MD;  Location: [facility];  Service: Thoracic;  Laterality: Right;  . TALC PLEURODESIS Right 09/16/2014   Procedure: TALC PLEURADESIS/ slurry;  Surgeon: Grace Isaac, MD;  Location: [place];  Service: Thoracic;   Laterality: Right;  . THORACOSCOPY  01-26-2009   w/   lung/  node biopsy's  . THUMB ARTHROSCOPY Left    - removed bone spur  . TONSILLECTOMY  AS CHILD  . TOTAL ABDOMINAL HYSTERECTOMY W/ BILATERAL SALPINGOOPHORECTOMY  1982   W/  APPENDECTOMY  . TRANSTHORACIC ECHOCARDIOGRAM  12-23-2008   NORMAL LV/  EF 65-70%/  MILD MR  &  TR  . VAULT SUSPENSION PLUS CYSTOCELE REPAIR WITH GRAFT  06-13-2010    REVIEW OF SYSTEMS:  A comprehensive review of systems was negative.   PHYSICAL EXAMINATION: General appearance: alert, cooperative and no distress Head: Normocephalic, without obvious abnormality, atraumatic Neck: no adenopathy, no JVD, supple, symmetrical, trachea midline and thyroid not enlarged, symmetric,  no tenderness/mass/nodules Lymph nodes: Cervical, supraclavicular, and axillary nodes normal. Resp: clear to auscultation bilaterally Back: symmetric, no curvature. ROM normal. No CVA tenderness. Cardio: regular rate and rhythm, S1, S2 normal, no murmur, click, rub or gallop GI: soft, non-tender; bowel sounds normal; no masses,  no organomegaly Extremities: extremities normal, atraumatic, no cyanosis or edema  ECOG PERFORMANCE STATUS: 1 - Symptomatic but completely ambulatory  Blood pressure (!) 151/77, pulse 89, temperature 97.8 F (36.6 C), temperature source Oral, resp. rate 20, height _0  (1.575 m), weight 117 lb 8 oz (53.3 kg), SpO2 99 %.  LABORATORY DATA: Lab Results  Component Value Date   WBC 5.3 01/13/2019   HGB 12.6 01/13/2019   HCT 37.3 01/13/2019   MCV 94.7 01/13/2019   PLT 233 01/13/2019      Chemistry      Component Value Date/Time   NA 142 11/17/2018 0931   NA 142 12/10/2017 1000   K 3.8 11/17/2018 0931   K 4.0 12/10/2017 1000   CL 106 11/17/2018 0931   CL 108 (H) 06/03/2013 0939   CO2 27 11/17/2018 0931   CO2 28 12/10/2017 1000   BUN 21 11/17/2018 0931   BUN 20.4 12/10/2017 1000   CREATININE 0.91 11/17/2018 0931   CREATININE 0.9 12/10/2017 1000        Component Value Date/Time   CALCIUM 9.4 11/17/2018 0931   CALCIUM 9.7 12/10/2017 1000   ALKPHOS 54 11/17/2018 0931   ALKPHOS 63 12/10/2017 1000   AST 28 11/17/2018 0931   AST 25 12/10/2017 1000   ALT 21 11/17/2018 0931   ALT 17 12/10/2017 1000   BILITOT 1.2 11/17/2018 0931   BILITOT 1.05 12/10/2017 1000       RADIOGRAPHIC STUDIES: No results found.  ASSESSMENT AND PLAN:  This is a very pleasant 84 years old white female with stage IIIB non-small cell lung cancer, adenocarcinoma with positive EGFR mutation diagnosed in January 2010. She has been on treatment with Tarceva for more than 9 years.   She is currently on Tarceva 100 mg p.o. daily status post 70 months. The patient has been tolerating her treatment well with no concerning adverse effects. I recommended for her to continue her current treatment with Tarceva with the same dose. For the respiratory issues, she is followed by Dr. Melvyn Novas and she will continue her current inhaler. The patient will come back for follow-up visit in 2 months for evaluation with repeat blood work. She was advised to call immediately if she has any concerning symptoms in the interval. The patient voices understanding of current disease status and treatment options and is in agreement with the current care plan. All questions were answered. The patient knows to call the clinic with any problems, questions or concerns. We can certainly see the patient much sooner if necessary. Disclaimer: This note was dictated with voice recognition software. Similar sounding words can inadvertently be transcribed and may not be corrected upon review.

## 2019-01-15 ENCOUNTER — Ambulatory Visit: Payer: Medicare Other | Admitting: Internal Medicine

## 2019-01-15 ENCOUNTER — Encounter: Payer: Self-pay | Admitting: Internal Medicine

## 2019-01-15 VITALS — BP 136/84 | HR 97 | Ht 62.5 in | Wt 117.4 lb

## 2019-01-15 DIAGNOSIS — R0902 Hypoxemia: Secondary | ICD-10-CM

## 2019-01-15 DIAGNOSIS — R0609 Other forms of dyspnea: Secondary | ICD-10-CM

## 2019-01-15 NOTE — Patient Instructions (Addendum)
Continue anoro as you are and ok to cancel the oxygen   Please schedule a follow up visit in 6  months but call sooner if needed

## 2019-01-15 NOTE — Progress Notes (Addendum)
Dana [name], female    DOB: 10-05-1935,    MRN: [id number]    Brief patient profile:  67 yowf quit smoking 1979 with chronic doe prior  evals:   07/05/2016  Walked RA x 3 laps @ 185 ft each stopped due to  End of study, nl pace no desat or sob  - Spirometry 07/05/2016  wnl p am spiriva  - CTa chest 07/05/2016 neg pe/ neg ILD, R infrahilar density c/w lung ca vs post RT scar     Oncology note 11/17/18 : DIAGNOSIS: Stage IIIB (T2a, N3, M0) non-small cell lung cancer, adenocarcinoma with positive EGFR mutation diagnosed in January 2010.  PRIOR THERAPY: 1) Status post concurrent chemoradiation with weekly carboplatin and paclitaxel; last dose given March 21, 2009.  2) Tarceva at 150 mg p.o. daily, status post approximately 48 months of treatment, discontinued secondary to persistent diarrhea.  3) status post right Pleurx catheter placement for right nonmalignant pleural effusion. 4) R pleurex Ger[place] 2017 / sp pleurodesis  CURRENT THERAPY: Tarceva 100 mg by mouth daily started 03/19/2013, status post 68 months of treatment.     History of Present Illness  11/25/2018  Pulmonary consultation / Dr Blima Singer re sob   Chief Complaint  Patient presents with  . Pulmonary Consult    Referred by Theadore Nan. Pt c/o SOB x 10 yrs, worse x 3 months. She gets out of breath walking up a flight of stairs.   Dyspnea:  Indolent onset/ As of 3 months prior to [place] worse" to point where struggles step to step and stops at top of one flight  Still able to shop at [place] / does not check sats  Cough: none Sleep: fine flat on one pillow SABA use: none  rec Goal is to keep your 02 saturation above 90% by walking slower or adjusting 02 flow to reach at least 90%      12/03/2018  f/u ov/Searcy Miyoshi re: sob Chief Complaint  Patient presents with  . Follow-up    She states breathing seems better but she has not been as active. She was started on o2 per PCP- she only uses when she  is at home during the day, none with sleep.   Dyspnea:  Has not tried steps yet but seems easier room to room 2lpm and  Typically maintaining > 90% when she checks it  Cough: none Sleeping: fine  SABA use: none 02: 2lpm prn daytime not at hs  rec Start anoro one click each am  - take two good deep drags then rinse and gargle  Adjust 02 saturations to keep it above 90%     01/15/2019  f/u ov/[facility]ry [place] re: copd gold 0 / ex hypoxemia  Chief Complaint  Patient presents with  . Follow-up    Dyspnea on exertion  Dyspnea:  MMRC1 = can walk nl pace, flat grade, can't hurry or go uphills or steps s sob  Cough: some in am p anoro Sleeping: ok almost flat SABA use: none 02: none at all since last ov    No obvious day to day or daytime variability or assoc excess/ purulent sputum or mucus plugs or hemoptysis or cp or chest tightness, subjective wheeze or overt sinus or hb symptoms.   Sleeping  without nocturnal  or early am exacerbation  of respiratory  c/o's or need for noct saba. Also denies any obvious fluctuation of symptoms with weather or environmental changes or other aggravating or alleviating  factors except as outlined above   No unusual exposure hx or h/o childhood pna/ asthma or knowledge of premature birth.  Current Allergies, Complete Past Medical History, Past Surgical History, Family History, and Social History were reviewed in [employer] record.  ROS  The following are not active complaints unless bolded Hoarseness, sore throat, dysphagia, dental problems, itching, sneezing,  nasal congestion or discharge of excess mucus or purulent secretions, ear ache,   fever, chills, sweats, unintended wt loss or wt gain, classically pleuritic or exertional cp,  orthopnea pnd or arm/hand swelling  or leg swelling, presyncope, palpitations, abdominal pain, anorexia, nausea, vomiting, diarrhea  or change in bowel habits or change in bladder habits, change in stools or change in  urine, dysuria, hematuria,  rash, arthralgias, visual complaints, headache, numbness, weakness or ataxia or problems with walking or coordination,  change in mood or  memory.        Current Meds  Medication Sig  . aspirin EC 81 MG tablet Take 81 mg by mouth daily.  Marland Kitchen augmented betamethasone dipropionate (DIPROLENE-AF) 0.05 % ointment Apply 1 application topically 2 (two) times daily as needed.  . bismuth subsalicylate (PEPTO BISMOL) 262 MG/15ML suspension Take 30 mLs by mouth every three (3) days as needed.  . Calcium Carb-Cholecalciferol (CALCIUM-VITAMIN D) 500-200 MG-UNIT tablet Take by mouth.  . Calcium Carbonate-Vitamin D 600-400 MG-UNIT per chew tablet Chew 1 tablet by mouth 2 (two) times daily.  . cholecalciferol (VITAMIN D) 1000 UNITS tablet Take 1,000 Units by mouth daily.   . clindamycin (CLEOCIN-T) 1 % external solution Apply topically 2 (two) times daily.  . Coenzyme Q10 (COQ10) 100 MG CAPS Take 1 capsule by mouth daily.   . Cyanocobalamin (B-12) 1000 MCG CAPS Take 1,000 mcg by mouth 3 (three) times daily with meals.   . erlotinib (TARCEVA) 100 MG tablet Take 1 tablet (100 mg total) by mouth daily. Take on an empty stomach 1 hour before meals or 2 hours after  . Hydrocodone-Acetaminophen (VICODIN) 5-300 MG TABS Take 1 tablet by mouth 4 (four) times daily as needed (for pain).   . hydroxychloroquine (PLAQUENIL) 200 MG tablet Take by mouth.  . ibandronate (BONIVA) 150 MG tablet   . loperamide (IMODIUM A-D) 2 MG tablet Take 1 mg by mouth 4 (four) times daily as needed for diarrhea or loose stools.   Marland Kitchen loratadine (CLARITIN) 10 MG tablet Take 10 mg by mouth daily as needed for allergies.   Marland Kitchen LORazepam (ATIVAN) 0.5 MG tablet Take 1 tablet (0.5 mg total) by mouth every 8 (eight) hours as needed for anxiety.  . meloxicam (MOBIC) 15 MG tablet Take 15 mg by mouth daily.  . mirtazapine (REMERON) 15 MG tablet TAKE 1 TABLET BY MOUTH AT BEDTIME  . oxyCODONE (OXY IR/ROXICODONE) 5 MG immediate  release tablet Take 1 tablet (5 mg total) by mouth every 4 (four) hours as needed for moderate pain.  Vladimir Faster Glycol-Propyl Glycol (SYSTANE OP) Place 1 drop into both eyes at bedtime as needed (for dry eyes.).   Marland Kitchen psyllium (METAMUCIL) 58.6 % powder Take 1 packet by mouth daily as needed.  . Simethicone (GAS-X PO) Take 1 tablet by mouth 2 (two) times daily as needed (for gas).   Marland Kitchen umeclidinium-vilanterol (ANORO ELLIPTA) 62.5-25 MCG/INH AEPB Inhale 1 puff into the lungs daily.               Objective:         12/03/2018  120  11/25/18 116 lb (52.6 kg)  11/17/18 117 lb 6.4 oz (53.3 kg)  09/16/18 117 lb 4.8 oz (53.2 kg)     amb somber  wf nad    Vital signs reviewed - Note on arrival 02 sats  97% on RA     HEENT: nl dentition / oropharynx. Nl external ear canals without cough reflex -  Mild bilateral non-specific turbinate edema     NECK :  without JVD/Nodes/TM/ nl carotid upstrokes bilaterally   LUNGS: no acc muscle use,  Mild barrel  contour chest wall with bilateral  Distant bs s audible wheeze and  without cough on insp or exp maneuver and mild  Hyperresonant  to  percussion bilaterally     CV:  RRR  no s3 or murmur or increase in P2, and no edema   ABD:  soft and nontender with pos late insp Hoover's  in the supine position. No bruits or organomegaly appreciated, bowel sounds nl  MS:   Nl gait/  ext warm without deformities, calf tenderness, cyanosis or clubbing No obvious joint restrictions   SKIN: warm and dry without lesions    NEURO:  alert, approp, nl sensorium with  no motor or cerebellar deficits apparent.                   Assessment

## 2019-01-18 ENCOUNTER — Encounter: Payer: Self-pay | Admitting: Internal Medicine

## 2019-01-18 NOTE — Assessment & Plan Note (Signed)
See walking study 11/25/2018 desats on RA corrected on 2lpm    - 01/15/2019 refused to continue to wear any 02 at all > d/c'd  New guidelines for 02 use in this setting are : only if pt perceives improvement in activity tol on vs off 02, which is clearly not the case here, so ok to D/C

## 2019-02-07 ENCOUNTER — Other Ambulatory Visit: Payer: Self-pay | Admitting: Internal Medicine

## 2019-03-05 ENCOUNTER — Other Ambulatory Visit: Payer: Self-pay | Admitting: Internal Medicine

## 2019-03-05 DIAGNOSIS — C3491 Malignant neoplasm of unspecified part of right bronchus or lung: Secondary | ICD-10-CM

## 2019-03-09 ENCOUNTER — Inpatient Hospital Stay: Payer: Medicare Other

## 2019-03-09 ENCOUNTER — Inpatient Hospital Stay: Payer: Medicare Other | Attending: Internal Medicine | Admitting: Internal Medicine

## 2019-03-24 ENCOUNTER — Telehealth: Payer: Self-pay | Admitting: Internal Medicine

## 2019-03-24 NOTE — Telephone Encounter (Signed)
Returned call to patient regarding rescheduling appointment. Per patient appointment moved from March to May. Message to provider.

## 2019-04-17 ENCOUNTER — Other Ambulatory Visit: Payer: Self-pay | Admitting: Internal Medicine

## 2019-04-17 DIAGNOSIS — C3491 Malignant neoplasm of unspecified part of right bronchus or lung: Secondary | ICD-10-CM

## 2019-04-20 ENCOUNTER — Other Ambulatory Visit: Payer: Self-pay | Admitting: Family Medicine

## 2019-04-20 DIAGNOSIS — Z1231 Encounter for screening mammogram for malignant neoplasm of breast: Secondary | ICD-10-CM

## 2019-05-08 ENCOUNTER — Encounter: Payer: Self-pay | Admitting: Internal Medicine

## 2019-05-08 ENCOUNTER — Ambulatory Visit (INDEPENDENT_AMBULATORY_CARE_PROVIDER_SITE_OTHER): Payer: Medicare Other | Admitting: Internal Medicine

## 2019-05-08 ENCOUNTER — Other Ambulatory Visit: Payer: Self-pay

## 2019-05-08 VITALS — BP 128/78 | HR 101 | Temp 97.7°F | Ht 62.5 in | Wt 115.8 lb

## 2019-05-08 DIAGNOSIS — R0609 Other forms of dyspnea: Secondary | ICD-10-CM

## 2019-05-08 DIAGNOSIS — R0902 Hypoxemia: Secondary | ICD-10-CM

## 2019-05-08 NOTE — Patient Instructions (Addendum)
No change in medications   If breathing is worse the week after [place], consider Reclast or Prolia    Continue to monitor your saturation with activity and call if trending down    Please schedule a follow up visit in 6 months but call sooner if needed

## 2019-05-08 NOTE — Progress Notes (Signed)
Dana [name], female    DOB: 1935/12/03,    MRN: [id number]    Brief patient profile:  62 yowf mother of primary care physician in [place] quit smoking 1979 with chronic doe prior  evals:   07/05/2016  Walked RA x 3 laps @ 185 ft each stopped due to  End of study, nl pace no desat or sob  - Spirometry 07/05/2016  wnl p am spiriva  - CTa chest 07/05/2016 neg pe/ neg ILD, R infrahilar density c/w lung ca vs post RT scar     Oncology note 11/17/18 : DIAGNOSIS: Stage IIIB (T2a, N3, M0) non-small cell lung cancer, adenocarcinoma with positive EGFR mutation diagnosed in January 2010.  PRIOR THERAPY: 1) Status post concurrent chemoradiation with weekly carboplatin and paclitaxel; last dose given March 21, 2009.  2) Tarceva at 150 mg p.o. daily, status post approximately 48 months of treatment, discontinued secondary to persistent diarrhea.  3) status post right Pleurx catheter placement for right nonmalignant pleural effusion. 4) R pleurex Gerhardt 2017 / sp pleurodesis  CURRENT THERAPY: Tarceva 100 mg by mouth daily started 03/19/2013, status post 68 months of treatment.     History of Present Illness  11/25/2018  Pulmonary consultation / Dr Blima Singer re sob   Chief Complaint  Patient presents with  . Pulmonary Consult    Referred by Theadore Nan. Pt c/o SOB x 10 yrs, worse x 3 months. She gets out of breath walking up a flight of stairs.   Dyspnea:  Indolent onset/ As of 3 months prior to [place] worse" to point where struggles step to step and stops at top of one flight  Still able to shop at [employer] / does not check sats  Cough: none Sleep: fine flat on one pillow SABA use: none  rec Goal is to keep your 02 saturation above 90% by walking slower or adjusting 02 flow to reach at least 90%      12/03/2018  f/u ov/Katena Petitjean re: sob Chief Complaint  Patient presents with  . Follow-up    She states breathing seems better but she has not been as active. She  was started on o2 per PCP- she only uses when she is at home during the day, none with sleep.   Dyspnea:  Has not tried steps yet but seems easier room to room 2lpm and  Typically maintaining > 90% when she checks it  Cough: none Sleeping: fine  SABA use: none 02: 2lpm prn daytime not at hs  rec Start anoro one click each am  - take two good deep drags then rinse and gargle  Adjust 02 saturations to keep it above 90%     01/15/2019  f/u ov/Shaquera Ansley re: copd gold 0 / ex hypoxemia  Chief Complaint  Patient presents with  . Follow-up    Dyspnea on exertion  Dyspnea:  MMRC1 = can walk nl pace, flat grade, can't hurry or go uphills or steps s sob  Cough: some in am p anoro Sleeping: ok almost flat SABA use: none 02: none at all since last ov  rec No change anoro   05/08/2019  f/u ov/Shyla Gayheart re: worse sob / GOLD O on Anoro/ no longer using 02  Chief Complaint  Patient presents with  . Follow-up    Breathing has been worse since end of April 2020.    Dyspnea:  No sob  at rest if quiet ie not talking but seems to lose  her breath now if talks too much  / mb and back /couple of hundred feet and incline back can still do s stopping but r knee limiting and sats are fine  when she remembers to check them  Cough: none  Sleeping: flat ok / one pillow  SABA use: none  02:  None  Note aving new HB despite nexium on Bonivo monthly    No obvious day to day or daytime variability or assoc excess/ purulent sputum or mucus plugs or hemoptysis or cp or chest tightness, subjective wheeze or overt sinus   symptoms.   Sleeping  without nocturnal  or early am exacerbation  of respiratory  c/o's or need for noct saba. Also denies any obvious fluctuation of symptoms with weather or environmental changes or other aggravating or alleviating factors except as outlined above   No unusual exposure hx or h/o childhood pna/ asthma or knowledge of premature birth.  Current Allergies, Complete Past Medical History,  Past Surgical History, Family History, and Social History were reviewed in [employer] record.  ROS  The following are not active complaints unless bolded Hoarseness, sore throat, dysphagia, dental problems, itching, sneezing,  nasal congestion or discharge of excess mucus or purulent secretions, ear ache,   fever, chills, sweats, unintended wt loss or wt gain, classically pleuritic or exertional cp,  orthopnea pnd or arm/hand swelling  or leg swelling, presyncope, palpitations, abdominal pain, anorexia, nausea, vomiting, diarrhea  or change in bowel habits or change in bladder habits, change in stools or change in urine, dysuria, hematuria,  rash, arthralgias, visual complaints, headache, numbness, weakness or ataxia or problems with walking or coordination,  change in mood or  memory.        Current Meds  Medication Sig  . aspirin EC 81 MG tablet Take 81 mg by mouth daily.  Marland Kitchen augmented betamethasone dipropionate (DIPROLENE-AF) 0.05 % ointment Apply 1 application topically 2 (two) times daily as needed.  . bismuth subsalicylate (PEPTO BISMOL) 262 MG/15ML suspension Take 30 mLs by mouth every three (3) days as needed.  . Calcium Carb-Cholecalciferol (CALCIUM-VITAMIN D) 500-200 MG-UNIT tablet Take by mouth.  . Calcium Carbonate-Vitamin D 600-400 MG-UNIT per chew tablet Chew 1 tablet by mouth 2 (two) times daily.  . cholecalciferol (VITAMIN D) 1000 UNITS tablet Take 1,000 Units by mouth daily.   . clindamycin (CLEOCIN T) 1 % external solution APPLY  SOLUTION TOPICALLY TO AFFECTED AREA(S) TWICE DAILY  . Coenzyme Q10 (COQ10) 100 MG CAPS Take 1 capsule by mouth daily.   . Cyanocobalamin (B-12) 1000 MCG CAPS Take 1,000 mcg by mouth 3 (three) times daily with meals.   . erlotinib (TARCEVA) 100 MG tablet Take 1 tablet (100 mg total) by mouth daily. Take on an empty stomach 1 hour before meals or 2 hours after  . Hydrocodone-Acetaminophen (VICODIN) 5-300 MG TABS Take 1 tablet by mouth  4 (four) times daily as needed (for pain).   . hydroxychloroquine (PLAQUENIL) 200 MG tablet Take by mouth.  . ibandronate (BONIVA) 150 MG tablet   . loperamide (IMODIUM A-D) 2 MG tablet Take 1 mg by mouth 4 (four) times daily as needed for diarrhea or loose stools.   Marland Kitchen loratadine (CLARITIN) 10 MG tablet Take 10 mg by mouth daily as needed for allergies.   Marland Kitchen LORazepam (ATIVAN) 0.5 MG tablet Take 1 tablet (0.5 mg total) by mouth every 8 (eight) hours as needed for anxiety.  . meloxicam (MOBIC) 15 MG tablet Take 15 mg  by mouth daily.  . mirtazapine (REMERON) 15 MG tablet TAKE 1 TABLET BY MOUTH AT BEDTIME  . oxyCODONE (OXY IR/ROXICODONE) 5 MG immediate release tablet Take 1 tablet (5 mg total) by mouth every 4 (four) hours as needed for moderate pain.  Vladimir Faster Glycol-Propyl Glycol (SYSTANE OP) Place 1 drop into both eyes at bedtime as needed (for dry eyes.).   Marland Kitchen psyllium (METAMUCIL) 58.6 % powder Take 1 packet by mouth daily as needed.  . Simethicone (GAS-X PO) Take 1 tablet by mouth 2 (two) times daily as needed (for gas).   Marland Kitchen umeclidinium-vilanterol (ANORO ELLIPTA) 62.5-25 MCG/INH AEPB Inhale 1 puff into the lungs daily.               Objective:      pleasant amb wf nad / classic voice fatigue  05/08/2019        115  12/03/2018      120  11/25/18 116 lb (52.6 kg)  11/17/18 117 lb 6.4 oz (53.3 kg)  09/16/18 117 lb 4.8 oz (53.2 kg)    Vital signs reviewed - Note on arrival 02 sats  95% on RA     HEENT: nl dentition / oropharynx. Nl external ear canals without cough reflex -  Mild bilateral non-specific turbinate edema     NECK :  without JVD/Nodes/TM/ nl carotid upstrokes bilaterally   LUNGS: no acc muscle use,  Mild barrel  contour chest wall with bilateral  Distant bs s audible wheeze and  without cough on insp or exp maneuver and mild  Hyperresonant  to  percussion bilaterally     CV:  RRR  no s3 or murmur or increase in P2, and no edema   ABD:  soft and nontender with  pos late insp Hoover's  in the supine position. No bruits or organomegaly appreciated, bowel sounds nl  MS:   Nl gait/  ext warm with RA changes R wrist only, calf tenderness, cyanosis or clubbing No obvious joint restrictions   SKIN: warm and dry without lesions    NEURO:  alert, approp, nl sensorium with  no motor or cerebellar deficits apparent.          CT chest ? Due 05/11/19 per dr [name]           Assessment

## 2019-05-09 ENCOUNTER — Encounter: Payer: Self-pay | Admitting: Internal Medicine

## 2019-05-09 NOTE — Assessment & Plan Note (Addendum)
Quit smoking 1979 07/05/2016  Walked RA x 3 laps @ 185 ft each stopped due to  End of study, nl pace no desat or sob  - Spirometry 07/05/2016  wnl p am spiriva  - CTa chest 07/05/2016 neg pe/ neg ILD, R infrahilar density c/w lung ca vs post RT scar   Chest CT w contrast  09/12/18  Stable CT of the chest - 11/25/2018   Walked RA  2 laps @ 227ft each @ moderate pace  stopped due to  desats to 86% corrected on 2lpm  - HRCT  11/28/18 1. No findings to suggest interstitial lung disease. 2. Chronic postradiation changes in the right lung and evidence of prior right-sided pleurodesis redemonstrated, as above. - PFT's  12/03/2018  FEV1 1.53 (86 % ) ratio 80  p 18 % improvement from saba p nothing prior to study with DLCO  52/52 % corrects to 80 % for alv volume  With minimal curvature    05/08/2019  After extensive coaching inhaler device,  effectiveness =    90% with elipta   >>>Problem at this point may be upper airway as she's more sob with talking than walking at this point (consider reflux or adverse effects of dpi)   >>> Advised to consider alternative to Bonivo (prolia or Reclast) or change to anoro to bevespi or stiolto due to concerns on upper airway.

## 2019-05-09 NOTE — Assessment & Plan Note (Signed)
See walking study 11/25/2018 desats on RA corrected on 2lpm    - 01/15/2019 refused to continue to wear any 02 at all > d/c'd  Advised to check sats walking and reconsider use of 02 if correlation of doe with sats < 89%.  Due for f/u with oncology for ? Ct chest anyway so no imaging done today.   I had an extended discussion with the patient reviewing all relevant studies completed to date and  lasting 15 to 20 minutes of a 25 minute visit    See device teaching which extended face to face time for this visit.  Each maintenance medication was reviewed in detail including emphasizing most importantly the difference between maintenance and prns and under what circumstances the prns are to be triggered using an action plan format that is not reflected in the computer generated alphabetically organized AVS which I have not found useful in most complex patients, especially with respiratory illnesses  Please see AVS for specific instructions unique to this visit that I personally wrote and verbalized to the the pt in detail and then reviewed with pt  by my nurse highlighting any  changes in therapy recommended at today's visit to their plan of care.

## 2019-05-09 NOTE — Assessment & Plan Note (Signed)
Quit smoking 1979 07/05/2016  Walked RA x 3 laps @ 185 ft each stopped due to  End of study, nl pace no desat or sob  - Spirometry 07/05/2016  wnl p am spiriva  - CTa chest 07/05/2016 neg pe/ neg ILD, R infrahilar density c/w lung ca vs post RT scar   Chest CT w contrast  09/12/18  Stable CT of the chest - 11/25/2018   Walked RA  2 laps @ 2110ft each @ moderate pace  stopped due to  desats to 86% corrected on 2lpm  - HRCT  11/28/18 1. No findings to suggest interstitial lung disease. 2. Chronic postradiation changes in the right lung and evidence of prior right-sided pleurodesis redemonstrated, as above. - PFT's  12/03/2018  FEV1 1.53 (86 % ) ratio 80  p 18 % improvement from saba p nothing prior to study with DLCO  52/52 % corrects to 80 % for alv volume  With minimal curvature   - empirical trial of anoro 12/11/1 > much improved 01/15/2019 and refusing to continue to wear 02 so rec d/c  Adequate control on present rx, reviewed in detail with pt > no change in rx needed

## 2019-05-11 ENCOUNTER — Inpatient Hospital Stay: Payer: Medicare Other | Attending: Internal Medicine | Admitting: Internal Medicine

## 2019-05-11 ENCOUNTER — Encounter: Payer: Self-pay | Admitting: Internal Medicine

## 2019-05-11 ENCOUNTER — Inpatient Hospital Stay: Payer: Medicare Other

## 2019-05-11 ENCOUNTER — Telehealth: Payer: Self-pay | Admitting: Internal Medicine

## 2019-05-11 ENCOUNTER — Other Ambulatory Visit: Payer: Self-pay

## 2019-05-11 VITALS — BP 141/72 | HR 70 | Temp 98.3°F | Resp 18 | Ht 62.5 in | Wt 112.7 lb

## 2019-05-11 DIAGNOSIS — Z9221 Personal history of antineoplastic chemotherapy: Secondary | ICD-10-CM | POA: Diagnosis not present

## 2019-05-11 DIAGNOSIS — I10 Essential (primary) hypertension: Secondary | ICD-10-CM

## 2019-05-11 DIAGNOSIS — R197 Diarrhea, unspecified: Secondary | ICD-10-CM

## 2019-05-11 DIAGNOSIS — M810 Age-related osteoporosis without current pathological fracture: Secondary | ICD-10-CM | POA: Diagnosis not present

## 2019-05-11 DIAGNOSIS — Z923 Personal history of irradiation: Secondary | ICD-10-CM

## 2019-05-11 DIAGNOSIS — R140 Abdominal distension (gaseous): Secondary | ICD-10-CM | POA: Diagnosis not present

## 2019-05-11 DIAGNOSIS — F329 Major depressive disorder, single episode, unspecified: Secondary | ICD-10-CM

## 2019-05-11 DIAGNOSIS — C3490 Malignant neoplasm of unspecified part of unspecified bronchus or lung: Secondary | ICD-10-CM

## 2019-05-11 DIAGNOSIS — C3491 Malignant neoplasm of unspecified part of right bronchus or lung: Secondary | ICD-10-CM

## 2019-05-11 DIAGNOSIS — Z5181 Encounter for therapeutic drug level monitoring: Secondary | ICD-10-CM

## 2019-05-11 DIAGNOSIS — Z7982 Long term (current) use of aspirin: Secondary | ICD-10-CM

## 2019-05-11 DIAGNOSIS — J449 Chronic obstructive pulmonary disease, unspecified: Secondary | ICD-10-CM | POA: Diagnosis not present

## 2019-05-11 DIAGNOSIS — K219 Gastro-esophageal reflux disease without esophagitis: Secondary | ICD-10-CM | POA: Diagnosis not present

## 2019-05-11 DIAGNOSIS — C342 Malignant neoplasm of middle lobe, bronchus or lung: Secondary | ICD-10-CM

## 2019-05-11 DIAGNOSIS — M129 Arthropathy, unspecified: Secondary | ICD-10-CM

## 2019-05-11 DIAGNOSIS — Z79899 Other long term (current) drug therapy: Secondary | ICD-10-CM

## 2019-05-11 LAB — CBC WITH DIFFERENTIAL (CANCER CENTER ONLY)
Abs Immature Granulocytes: 0.01 10*3/uL (ref 0.00–0.07)
Basophils Absolute: 0 10*3/uL (ref 0.0–0.1)
Basophils Relative: 1 %
Eosinophils Absolute: 0.3 10*3/uL (ref 0.0–0.5)
Eosinophils Relative: 4 %
HCT: 35.5 % — ABNORMAL LOW (ref 36.0–46.0)
Hemoglobin: 11.8 g/dL — ABNORMAL LOW (ref 12.0–15.0)
Immature Granulocytes: 0 %
Lymphocytes Relative: 11 %
Lymphs Abs: 0.6 10*3/uL — ABNORMAL LOW (ref 0.7–4.0)
MCH: 32.2 pg (ref 26.0–34.0)
MCHC: 33.2 g/dL (ref 30.0–36.0)
MCV: 96.7 fL (ref 80.0–100.0)
Monocytes Absolute: 0.6 10*3/uL (ref 0.1–1.0)
Monocytes Relative: 10 %
Neutro Abs: 4.5 10*3/uL (ref 1.7–7.7)
Neutrophils Relative %: 74 %
Platelet Count: 252 10*3/uL (ref 150–400)
RBC: 3.67 MIL/uL — ABNORMAL LOW (ref 3.87–5.11)
RDW: 13.8 % (ref 11.5–15.5)
WBC Count: 6.1 10*3/uL (ref 4.0–10.5)
nRBC: 0 % (ref 0.0–0.2)

## 2019-05-11 LAB — CMP (CANCER CENTER ONLY)
ALT: 24 U/L (ref 0–44)
AST: 28 U/L (ref 15–41)
Albumin: 3.5 g/dL (ref 3.5–5.0)
Alkaline Phosphatase: 44 U/L (ref 38–126)
Anion gap: 8 (ref 5–15)
BUN: 19 mg/dL (ref 8–23)
CO2: 28 mmol/L (ref 22–32)
Calcium: 9.1 mg/dL (ref 8.9–10.3)
Chloride: 107 mmol/L (ref 98–111)
Creatinine: 0.99 mg/dL (ref 0.44–1.00)
GFR, Est AFR Am: >60 mL/min (ref >60)
GFR, Estimated: 53 mL/min — ABNORMAL LOW (ref >60)
Glucose, Bld: 87 mg/dL (ref 70–99)
Potassium: 3.8 mmol/L (ref 3.5–5.1)
Sodium: 143 mmol/L (ref 135–145)
Total Bilirubin: 1.2 mg/dL (ref 0.3–1.2)
Total Protein: 6 g/dL — ABNORMAL LOW (ref 6.5–8.1)

## 2019-05-11 NOTE — Progress Notes (Signed)
[facility] [phone]   [phone]  OFFICE PROGRESS NOTE  [name], MD [place] [address]  DIAGNOSIS: Stage IIIB (T2a, N3, M0) non-small cell lung cancer, adenocarcinoma with positive EGFR mutation diagnosed in January 2010.  PRIOR THERAPY: 1) Status post concurrent chemoradiation with weekly carboplatin and paclitaxel; last dose given March 21, 2009.  2) Tarceva at 150 mg p.o. daily, status post approximately 48 months of treatment, discontinued secondary to persistent diarrhea.  3) status post right Pleurx catheter placement for right nonmalignant pleural effusion.  CURRENT THERAPY: Tarceva 100 mg by mouth daily started 03/19/2013, status post 74 months of treatment.  INTERVAL HISTORY: Dana [name] 84 y.o. female returns to the clinic today for follow-up visit.  The patient is feeling fine today with no concerning complaints except for several episodes of diarrhea over the last week.  She also has bloating with gas production.  She denied having any current chest pain, shortness of breath, cough or hemoptysis.  She denied having any fever or chills.  She has no nausea, vomiting, constipation or abdominal pain.  The patient has no headache or visual changes.  She lost few pounds since her last visit.   MEDICAL HISTORY: Past Medical History:  Diagnosis Date  . Arthritis    HANDS,  WRISTS  . COPD (chronic obstructive pulmonary disease) ([place])   . Depression 04/04/2017  . Dyspnea on exertion   . Encounter for therapeutic drug monitoring 10/01/2016  . GERD (gastroesophageal reflux disease)   . Hemorrhoid   . History of anal fissures   . History of lung cancer ONCOLOGIST--  DR [facility]--  LAST CT ,  NO RECURRENCE OR METS   DX JAN 2010 --  STAGE IIIA  NON-SMALL CELL ADENOCARCINOMA (RIGHT MIDDLE LOBE)---  S/P  CHEMORADIATIO THERAPY  (COMPLETE 03-21-2009)  . HTN (hypertension) 10/10/2017  . Imbalance 06/04/2017  . lung  ca dx'd 2010  . Normal cardiac stress test    2007  PER PT  . Osteoporosis   . Rash, skin    RIGHT FOREARM/ HAND  . Right wrist pain    MASS  . Wears glasses     ALLERGIES:  is allergic to amoxicillin.  MEDICATIONS:  Current Outpatient Medications  Medication Sig Dispense Refill  . aspirin EC 81 MG tablet Take 81 mg by mouth daily.    Marland Kitchen augmented betamethasone dipropionate (DIPROLENE-AF) 0.05 % ointment Apply 1 application topically 2 (two) times daily as needed.    . bismuth subsalicylate (PEPTO BISMOL) 262 MG/15ML suspension Take 30 mLs by mouth every three (3) days as needed.    . Calcium Carb-Cholecalciferol (CALCIUM-VITAMIN D) 500-200 MG-UNIT tablet Take by mouth.    . Calcium Carbonate-Vitamin D 600-400 MG-UNIT per chew tablet Chew 1 tablet by mouth 2 (two) times daily.    . cholecalciferol (VITAMIN D) 1000 UNITS tablet Take 1,000 Units by mouth daily.     . clindamycin (CLEOCIN T) 1 % external solution APPLY  SOLUTION TOPICALLY TO AFFECTED AREA(S) TWICE DAILY 60 mL 0  . Coenzyme Q10 (COQ10) 100 MG CAPS Take 1 capsule by mouth daily.     . Cyanocobalamin (B-12) 1000 MCG CAPS Take 1,000 mcg by mouth 3 (three) times daily with meals.     . erlotinib (TARCEVA) 100 MG tablet Take 1 tablet (100 mg total) by mouth daily. Take on an empty stomach 1 hour before meals or 2 hours after 30 tablet 5  .  Hydrocodone-Acetaminophen (VICODIN) 5-300 MG TABS Take 1 tablet by mouth 4 (four) times daily as needed (for pain).     . hydroxychloroquine (PLAQUENIL) 200 MG tablet Take by mouth.    . ibandronate (BONIVA) 150 MG tablet     . loperamide (IMODIUM A-D) 2 MG tablet Take 1 mg by mouth 4 (four) times daily as needed for diarrhea or loose stools.     Marland Kitchen loratadine (CLARITIN) 10 MG tablet Take 10 mg by mouth daily as needed for allergies.     Marland Kitchen LORazepam (ATIVAN) 0.5 MG tablet Take 1 tablet (0.5 mg total) by mouth every 8 (eight) hours as needed for anxiety. 30 tablet 0  . meloxicam (MOBIC) 15 MG  tablet Take 15 mg by mouth daily.  4  . mirtazapine (REMERON) 15 MG tablet TAKE 1 TABLET BY MOUTH AT BEDTIME 30 tablet 0  . oxyCODONE (OXY IR/ROXICODONE) 5 MG immediate release tablet Take 1 tablet (5 mg total) by mouth every 4 (four) hours as needed for moderate pain. 30 tablet 0  . Polyethyl Glycol-Propyl Glycol (SYSTANE OP) Place 1 drop into both eyes at bedtime as needed (for dry eyes.).     Marland Kitchen psyllium (METAMUCIL) 58.6 % powder Take 1 packet by mouth daily as needed.    . Simethicone (GAS-X PO) Take 1 tablet by mouth 2 (two) times daily as needed (for gas).     Marland Kitchen umeclidinium-vilanterol (ANORO ELLIPTA) 62.5-25 MCG/INH AEPB Inhale 1 puff into the lungs daily. 60 each 11   No current facility-administered medications for this visit.     SURGICAL HISTORY:  Past Surgical History:  Procedure Laterality Date  . ANAL FISSURE REPAIR  07/09/2011   INTERNAL SPHINCTEROTOMY  . BENIGN EXCISION LEFT BREAST CENTRAL DUCT  02/1999  . BREAST BIOPSY  01/31/2009   benign  . BREAST EXCISIONAL BIOPSY Left 2004   benign  . CHEST TUBE INSERTION Right 07/14/2014   Procedure: INSERTION PLEURAL DRAINAGE CATHETER RIGHT CHEST;  Surgeon: Grace Isaac, MD;  Location: [facility];  Service: Thoracic;  Laterality: Right;  . EXCISION RIGHT WRIST MASS  2012  . KNEE ARTHROSCOPY Right 1999  . MASS EXCISION Right 03/11/2014   Procedure: RIGHT WRIST DEEP MASS EXCISION WITH CULTURE AND BIOSPY;  Surgeon: Linna Hoff, MD;  Location: [facility];  Service: Orthopedics;  Laterality: Right;  . REMOVAL OF PLEURAL DRAINAGE CATHETER Right 10/14/2014   Procedure: REMOVAL OF PLEURAL DRAINAGE CATHETER;  Surgeon: Grace Isaac, MD;  Location: [facility];  Service: Thoracic;  Laterality: Right;  . TALC PLEURODESIS Right 09/16/2014   Procedure: TALC PLEURADESIS/ slurry;  Surgeon: Grace Isaac, MD;  Location: [facility];  Service: Thoracic;  Laterality: Right;  . THORACOSCOPY  01-26-2009   w/   lung/  node biopsy's  .  THUMB ARTHROSCOPY Left    - removed bone spur  . TONSILLECTOMY  AS CHILD  . TOTAL ABDOMINAL HYSTERECTOMY W/ BILATERAL SALPINGOOPHORECTOMY  1982   W/  APPENDECTOMY  . TRANSTHORACIC ECHOCARDIOGRAM  12-23-2008   NORMAL LV/  EF 65-70%/  MILD MR  &  TR  . VAULT SUSPENSION PLUS CYSTOCELE REPAIR WITH GRAFT  06-13-2010    REVIEW OF SYSTEMS:  A comprehensive review of systems was negative except for: Constitutional: positive for fatigue and weight loss Gastrointestinal: positive for diarrhea   PHYSICAL EXAMINATION: General appearance: alert, cooperative and no distress Head: Normocephalic, without obvious abnormality, atraumatic Neck: no adenopathy, no JVD, supple, symmetrical, trachea midline and thyroid not enlarged,  symmetric, no tenderness/mass/nodules Lymph nodes: Cervical, supraclavicular, and axillary nodes normal. Resp: clear to auscultation bilaterally Back: symmetric, no curvature. ROM normal. No CVA tenderness. Cardio: regular rate and rhythm, S1, S2 normal, no murmur, click, rub or gallop GI: soft, non-tender; bowel sounds normal; no masses,  no organomegaly Extremities: extremities normal, atraumatic, no cyanosis or edema  ECOG PERFORMANCE STATUS: 1 - Symptomatic but completely ambulatory  Blood pressure (!) 141/72, pulse 70, temperature 98.3 F (36.8 C), temperature source Oral, resp. rate 18, height 5' 2.5" (1.588 m), weight 112 lb 11.2 oz (51.1 kg), SpO2 93 %.  LABORATORY DATA: Lab Results  Component Value Date   WBC 6.1 05/11/2019   HGB 11.8 (L) 05/11/2019   HCT 35.5 (L) 05/11/2019   MCV 96.7 05/11/2019   PLT 252 05/11/2019      Chemistry      Component Value Date/Time   NA 142 01/13/2019 0905   NA 142 12/10/2017 1000   K 3.9 01/13/2019 0905   K 4.0 12/10/2017 1000   CL 106 01/13/2019 0905   CL 108 (H) 06/03/2013 0939   CO2 27 01/13/2019 0905   CO2 28 12/10/2017 1000   BUN 27 (H) 01/13/2019 0905   BUN 20.4 12/10/2017 1000   CREATININE 0.96 01/13/2019 0905    CREATININE 0.9 12/10/2017 1000      Component Value Date/Time   CALCIUM 9.2 01/13/2019 0905   CALCIUM 9.7 12/10/2017 1000   ALKPHOS 47 01/13/2019 0905   ALKPHOS 63 12/10/2017 1000   AST 29 01/13/2019 0905   AST 25 12/10/2017 1000   ALT 24 01/13/2019 0905   ALT 17 12/10/2017 1000   BILITOT 1.0 01/13/2019 0905   BILITOT 1.05 12/10/2017 1000       RADIOGRAPHIC STUDIES: No results found.  ASSESSMENT AND PLAN:  This is a very pleasant 84 years old white female with stage IIIB non-small cell lung cancer, adenocarcinoma with positive EGFR mutation diagnosed in January 2010. She has been on treatment with Tarceva for more than 9 years.   She is currently on Tarceva 100 mg p.o. daily status post 74 months. The patient has been tolerating this treatment well with no concerning adverse effects but last week she had several episodes of diarrhea with gas production.  It is unlikely related to Tarceva but as a precautionary measure I asked her to hold her treatment with Tarceva for 1 week. She will also adjust her meals to avoid any spicy or dairy products. I will see her back for follow-up visit in 1 months for evaluation with repeat CT scan of the chest for restaging of her disease. The patient was advised to call immediately if she has any concerning symptoms in the interval. The patient voices understanding of current disease status and treatment options and is in agreement with the current care plan. All questions were answered. The patient knows to call the clinic with any problems, questions or concerns. We can certainly see the patient much sooner if necessary. Disclaimer: This note was dictated with voice recognition software. Similar sounding words can inadvertently be transcribed and may not be corrected upon review.

## 2019-05-11 NOTE — Telephone Encounter (Signed)
Scheduled appt per 5/18 los - letter mailed with appt date and time

## 2019-05-26 ENCOUNTER — Other Ambulatory Visit: Payer: Self-pay | Admitting: Internal Medicine

## 2019-05-26 DIAGNOSIS — C3491 Malignant neoplasm of unspecified part of right bronchus or lung: Secondary | ICD-10-CM

## 2019-05-26 NOTE — Telephone Encounter (Signed)
Pt requesting refill

## 2019-05-28 ENCOUNTER — Other Ambulatory Visit: Payer: Self-pay

## 2019-05-28 ENCOUNTER — Encounter (HOSPITAL_BASED_OUTPATIENT_CLINIC_OR_DEPARTMENT_OTHER): Payer: Self-pay

## 2019-05-28 ENCOUNTER — Emergency Department (HOSPITAL_BASED_OUTPATIENT_CLINIC_OR_DEPARTMENT_OTHER): Payer: Medicare Other

## 2019-05-28 ENCOUNTER — Emergency Department (HOSPITAL_BASED_OUTPATIENT_CLINIC_OR_DEPARTMENT_OTHER)
Admission: EM | Admit: 2019-05-28 | Discharge: 2019-05-29 | Disposition: A | Discharge status: Home / Self Care | Payer: Medicare Other | Attending: Emergency Medicine | Admitting: Emergency Medicine

## 2019-05-28 DIAGNOSIS — N201 Calculus of ureter: Secondary | ICD-10-CM | POA: Diagnosis not present

## 2019-05-28 DIAGNOSIS — Z79899 Other long term (current) drug therapy: Secondary | ICD-10-CM | POA: Diagnosis not present

## 2019-05-28 DIAGNOSIS — Z87891 Personal history of nicotine dependence: Secondary | ICD-10-CM | POA: Diagnosis not present

## 2019-05-28 DIAGNOSIS — R109 Unspecified abdominal pain: Secondary | ICD-10-CM | POA: Diagnosis present

## 2019-05-28 DIAGNOSIS — Z7982 Long term (current) use of aspirin: Secondary | ICD-10-CM | POA: Insufficient documentation

## 2019-05-28 DIAGNOSIS — J449 Chronic obstructive pulmonary disease, unspecified: Secondary | ICD-10-CM | POA: Insufficient documentation

## 2019-05-28 DIAGNOSIS — I10 Essential (primary) hypertension: Secondary | ICD-10-CM | POA: Diagnosis not present

## 2019-05-28 DIAGNOSIS — Z1159 Encounter for screening for other viral diseases: Secondary | ICD-10-CM | POA: Insufficient documentation

## 2019-05-28 DIAGNOSIS — R390 Extravasation of urine: Secondary | ICD-10-CM

## 2019-05-28 LAB — CBC WITH DIFFERENTIAL/PLATELET
Abs Immature Granulocytes: 0.02 10*3/uL (ref 0.00–0.07)
Basophils Absolute: 0 10*3/uL (ref 0.0–0.1)
Basophils Relative: 0 %
Eosinophils Absolute: 0.1 10*3/uL (ref 0.0–0.5)
Eosinophils Relative: 1 %
HCT: 34.3 % — ABNORMAL LOW (ref 36.0–46.0)
Hemoglobin: 11.5 g/dL — ABNORMAL LOW (ref 12.0–15.0)
Immature Granulocytes: 0 %
Lymphocytes Relative: 14 %
Lymphs Abs: 0.9 10*3/uL (ref 0.7–4.0)
MCH: 32.1 pg (ref 26.0–34.0)
MCHC: 33.5 g/dL (ref 30.0–36.0)
MCV: 95.8 fL (ref 80.0–100.0)
Monocytes Absolute: 0.6 10*3/uL (ref 0.1–1.0)
Monocytes Relative: 10 %
Neutro Abs: 4.9 10*3/uL (ref 1.7–7.7)
Neutrophils Relative %: 75 %
Platelets: 256 10*3/uL (ref 150–400)
RBC: 3.58 MIL/uL — ABNORMAL LOW (ref 3.87–5.11)
RDW: 13.3 % (ref 11.5–15.5)
WBC: 6.5 10*3/uL (ref 4.0–10.5)
nRBC: 0 % (ref 0.0–0.2)

## 2019-05-28 LAB — COMPREHENSIVE METABOLIC PANEL
ALT: 23 U/L (ref 0–44)
AST: 32 U/L (ref 15–41)
Albumin: 3.9 g/dL (ref 3.5–5.0)
Alkaline Phosphatase: 54 U/L (ref 38–126)
Anion gap: 11 (ref 5–15)
BUN: 21 mg/dL (ref 8–23)
CO2: 24 mmol/L (ref 22–32)
Calcium: 9 mg/dL (ref 8.9–10.3)
Chloride: 104 mmol/L (ref 98–111)
Creatinine, Ser: 1.24 mg/dL — ABNORMAL HIGH (ref 0.44–1.00)
GFR calc Af Amer: 47 mL/min — ABNORMAL LOW (ref >60)
GFR calc non Af Amer: 40 mL/min — ABNORMAL LOW (ref >60)
Glucose, Bld: 116 mg/dL — ABNORMAL HIGH (ref 70–99)
Potassium: 3.6 mmol/L (ref 3.5–5.1)
Sodium: 139 mmol/L (ref 135–145)
Total Bilirubin: 0.9 mg/dL (ref 0.3–1.2)
Total Protein: 6.7 g/dL (ref 6.5–8.1)

## 2019-05-28 LAB — URINALYSIS, ROUTINE W REFLEX MICROSCOPIC
Bilirubin Urine: NEGATIVE
Glucose, UA: NEGATIVE mg/dL
Hgb urine dipstick: NEGATIVE
Ketones, ur: NEGATIVE mg/dL
Nitrite: NEGATIVE
Protein, ur: NEGATIVE mg/dL
Specific Gravity, Urine: 1.01 (ref 1.005–1.030)
pH: 6.5 (ref 5.0–8.0)

## 2019-05-28 LAB — URINALYSIS, MICROSCOPIC (REFLEX): Bacteria, UA: NONE SEEN

## 2019-05-28 LAB — LIPASE, BLOOD: Lipase: 41 U/L (ref 11–51)

## 2019-05-28 MED ORDER — CEPHALEXIN 500 MG PO CAPS: 500.0000 mg | ORAL_CAPSULE | Freq: Four times a day (QID) | ORAL | 0 refills | Status: DC

## 2019-05-28 MED ORDER — IOHEXOL 300 MG/ML  SOLN
100.0000 mL | Freq: Once | INTRAMUSCULAR | Status: AC | PRN
  Administered 2019-05-28: 23:00:00 80 mL via INTRAVENOUS

## 2019-05-28 MED ORDER — SODIUM CHLORIDE 0.9 % IV BOLUS
500.0000 mL | Freq: Once | INTRAVENOUS | Status: AC
  Administered 2019-05-28: 22:00:00 500 mL via INTRAVENOUS

## 2019-05-28 MED ORDER — OXYCODONE-ACETAMINOPHEN 5-325 MG PO TABS: 1.0000 | ORAL_TABLET | ORAL | 0 refills | Status: DC | PRN

## 2019-05-28 MED ORDER — MORPHINE SULFATE (PF) 4 MG/ML IV SOLN
4.0000 mg | Freq: Once | INTRAVENOUS | Status: AC
  Administered 2019-05-28: 4 mg via INTRAVENOUS
  Filled 2019-05-28: qty 1

## 2019-05-28 MED ORDER — SODIUM CHLORIDE 0.9 % IV SOLN
1.0000 g | Freq: Once | INTRAVENOUS | Status: AC
  Administered 2019-05-28: 1 g via INTRAVENOUS
  Filled 2019-05-28: qty 10

## 2019-05-28 MED ORDER — ONDANSETRON HCL 4 MG/2ML IJ SOLN
4.0000 mg | Freq: Once | INTRAMUSCULAR | Status: AC
  Administered 2019-05-28: 4 mg via INTRAVENOUS
  Filled 2019-05-28: qty 2

## 2019-05-28 MED ORDER — ONDANSETRON 8 MG PO TBDP: 8.0000 mg | ORAL_TABLET | Freq: Three times a day (TID) | ORAL | 1 refills | Status: DC | PRN

## 2019-05-28 MED ORDER — OXYCODONE-ACETAMINOPHEN 5-325 MG PO TABS
1.0000 | ORAL_TABLET | Freq: Once | ORAL | Status: AC
  Administered 2019-05-29: 1 via ORAL
  Filled 2019-05-28: qty 1

## 2019-05-28 NOTE — ED Provider Notes (Signed)
Nursing notes and vitals signs, including pulse oximetry, reviewed.  Summary of this visit's results, reviewed by myself:  EKG:  EKG Interpretation  Date/Time:    Ventricular Rate:    PR Interval:    QRS Duration:   QT Interval:    QTC Calculation:   R Axis:     Text Interpretation:         Labs:  Results for orders placed or performed during the hospital encounter of 05/28/19 (from the past 24 hour(s))  Comprehensive metabolic panel     Status: Abnormal   Collection Time: 05/28/19  9:41 PM  Result Value Ref Range   Sodium 139 135 - 145 mmol/L   Potassium 3.6 3.5 - 5.1 mmol/L   Chloride 104 98 - 111 mmol/L   CO2 24 22 - 32 mmol/L   Glucose, Bld 116 (H) 70 - 99 mg/dL   BUN 21 8 - 23 mg/dL   Creatinine, Ser 1.24 (H) 0.44 - 1.00 mg/dL   Calcium 9.0 8.9 - 10.3 mg/dL   Total Protein 6.7 6.5 - 8.1 g/dL   Albumin 3.9 3.5 - 5.0 g/dL   AST 32 15 - 41 U/L   ALT 23 0 - 44 U/L   Alkaline Phosphatase 54 38 - 126 U/L   Total Bilirubin 0.9 0.3 - 1.2 mg/dL   GFR calc non Af Amer 40 (L) >60 mL/min   GFR calc Af Amer 47 (L) >60 mL/min   Anion gap 11 5 - 15  Lipase, blood     Status: None   Collection Time: 05/28/19  9:41 PM  Result Value Ref Range   Lipase 41 11 - 51 U/L  CBC with Differential/Platelet     Status: Abnormal   Collection Time: 05/28/19 10:04 PM  Result Value Ref Range   WBC 6.5 4.0 - 10.5 K/uL   RBC 3.58 (L) 3.87 - 5.11 MIL/uL   Hemoglobin 11.5 (L) 12.0 - 15.0 g/dL   HCT 34.3 (L) 36.0 - 46.0 %   MCV 95.8 80.0 - 100.0 fL   MCH 32.1 26.0 - 34.0 pg   MCHC 33.5 30.0 - 36.0 g/dL   RDW 13.3 11.5 - 15.5 %   Platelets 256 150 - 400 K/uL   nRBC 0.0 0.0 - 0.2 %   Neutrophils Relative % 75 %   Neutro Abs 4.9 1.7 - 7.7 K/uL   Lymphocytes Relative 14 %   Lymphs Abs 0.9 0.7 - 4.0 K/uL   Monocytes Relative 10 %   Monocytes Absolute 0.6 0.1 - 1.0 K/uL   Eosinophils Relative 1 %   Eosinophils Absolute 0.1 0.0 - 0.5 K/uL   Basophils Relative 0 %   Basophils Absolute 0.0  0.0 - 0.1 K/uL   Immature Granulocytes 0 %   Abs Immature Granulocytes 0.02 0.00 - 0.07 K/uL  Urinalysis, Routine w reflex microscopic     Status: Abnormal   Collection Time: 05/28/19 10:25 PM  Result Value Ref Range   Color, Urine YELLOW YELLOW   APPearance CLEAR CLEAR   Specific Gravity, Urine 1.010 1.005 - 1.030   pH 6.5 5.0 - 8.0   Glucose, UA NEGATIVE NEGATIVE mg/dL   Hgb urine dipstick NEGATIVE NEGATIVE   Bilirubin Urine NEGATIVE NEGATIVE   Ketones, ur NEGATIVE NEGATIVE mg/dL   Protein, ur NEGATIVE NEGATIVE mg/dL   Nitrite NEGATIVE NEGATIVE   Leukocytes,Ua SMALL (A) NEGATIVE  Urinalysis, Microscopic (reflex)     Status: Abnormal   Collection Time: 05/28/19 10:25 PM  Result  Value Ref Range   RBC / HPF 0-5 0 - 5 RBC/hpf   WBC, UA 6-10 0 - 5 WBC/hpf   Bacteria, UA NONE SEEN NONE SEEN   Squamous Epithelial / LPF 0-5 0 - 5   Non Squamous Epithelial PRESENT (A) NONE SEEN   Hyaline Casts, UA PRESENT     Imaging Studies: Ct Abdomen Pelvis W Contrast  Result Date: 05/28/2019 CLINICAL DATA:  84 year old female with abdominal pain and left flank pain. History of lung cancer status post chemo and radiation. EXAM: CT ABDOMEN AND PELVIS WITH CONTRAST TECHNIQUE: Multidetector CT imaging of the abdomen and pelvis was performed using the standard protocol following bolus administration of intravenous contrast. CONTRAST:  34mL OMNIPAQUE IOHEXOL 300 MG/ML  SOLN COMPARISON:  CT of the abdomen pelvis dated 02/14/2011 FINDINGS: Lower chest: Partially visualized areas of scarring of the right lung base and right hilar region likely post treatment fibrosis. There are areas of calcification along the right diaphragmatic pleural surface. The visualized left lung base is clear. There is mild cardiomegaly. Coronary vascular calcifications involving the RCA. No intra-abdominal free air or free fluid. Hepatobiliary: The liver is unremarkable. There is layering stones within the gallbladder. No pericholecystic  fluid. Pancreas: Unremarkable. No pancreatic ductal dilatation or surrounding inflammatory changes. Spleen: Normal in size without focal abnormality. Adrenals/Urinary Tract: The adrenal glands are unremarkable. There is an 8 mm stone in the proximal left ureter with mild left hydronephrosis. There is small to moderate amount of urine in the left perinephric space. There is extravasation of the contrast on delayed images from the left renal pelvis in keeping with caliceal rupture. There is no hydronephrosis on the right. There is an extrarenal pelvis with mild pelviectasis on the right. The right ureter and urinary bladder appear unremarkable. Stomach/Bowel: There is a small hiatal hernia. There is no bowel obstruction or active inflammation. The appendix is not visualized with certainty. No inflammatory changes identified in the right lower quadrant. Vascular/Lymphatic: There is advanced aortoiliac atherosclerotic disease. No portal venous gas. There is no adenopathy. Reproductive: Hysterectomy. No pelvic mass. Other: None Musculoskeletal: Osteopenia with scoliosis and degenerative changes of the spine. No acute osseous pathology. IMPRESSION: 1. An 8 mm proximal left ureteral stone with mild left hydronephrosis. There is associated calyceal rupture and a small to moderate left perinephric urine leak. 2. Cholelithiasis. Electronically Signed   By: Anner Crete M.D.   On: 05/28/2019 23:14   11:38 PM Discussed with Dr. Lovena Neighbours of urology.  He recommends antibiotics, analgesics and outpatient follow-up. Patient is showing no signs of sepsis.  Urine has been sent for culture.    Shanon Rosser, MD 05/28/19 [phone]

## 2019-05-28 NOTE — ED Triage Notes (Signed)
C/o "extreme gas pain" in abd and pain to left flank-started ~1030am after eating breakfast-NAD-steady gait

## 2019-05-28 NOTE — ED Provider Notes (Signed)
[facility] EMERGENCY DEPARTMENT Provider Note   CSN: [id number] Arrival date & time: 05/28/19  2045    History   Chief Complaint No chief complaint on file.   HPI Dana [name] is a 84 y.o. female.     Patient is an 84 year old female with past medical history of COPD, GERD, and lung cancer.  She presents today for evaluation of abdominal pain.  This started this morning after eating breakfast.  She describes it as a "severe gas pain".  She denies any fevers or chills.  She denies any urinary complaints.  She does state that she vomited once and was nonbloody.  The history is provided by the patient.    Past Medical History:  Diagnosis Date   Arthritis    HANDS,  WRISTS   COPD (chronic obstructive pulmonary disease) ([place])    Depression 04/04/2017   Dyspnea on exertion    Encounter for therapeutic drug monitoring 10/01/2016   GERD (gastroesophageal reflux disease)    Hemorrhoid    History of anal fissures    History of lung cancer ONCOLOGIST--  DR [name]--  LAST CT ,  NO RECURRENCE OR METS   DX JAN 2010 --  STAGE IIIA  NON-SMALL CELL ADENOCARCINOMA (RIGHT MIDDLE LOBE)---  S/P  CHEMORADIATIO THERAPY  (COMPLETE 03-21-2009)   HTN (hypertension) 10/10/2017   Imbalance 06/04/2017   lung ca dx'd 2010   Normal cardiac stress test    2007  PER PT   Osteoporosis    Rash, skin    RIGHT FOREARM/ HAND   Right wrist pain    MASS   Wears glasses     Patient Active Problem List   Diagnosis Date Noted   Exercise hypoxemia 11/26/2018   HTN (hypertension) 10/10/2017   Imbalance 06/04/2017   Depression 04/04/2017   Left ankle injury, initial encounter 02/28/2017   Encounter for therapeutic drug monitoring 10/01/2016   DOE (dyspnea on exertion) 07/05/2016   Pleural effusion, right 07/13/2014   Right arm cellulitis 02/03/2014   Cancer of right lung parenchyma ([place]) 11/12/2011    Past Surgical History:  Procedure Laterality Date   ANAL  FISSURE REPAIR  07/09/2011   INTERNAL SPHINCTEROTOMY   BENIGN EXCISION LEFT BREAST CENTRAL DUCT  02/1999   BREAST BIOPSY  01/31/2009   benign   BREAST EXCISIONAL BIOPSY Left 2004   benign   CHEST TUBE INSERTION Right 07/14/2014   Procedure: INSERTION PLEURAL DRAINAGE CATHETER RIGHT CHEST;  Surgeon: Grace Isaac, MD;  Location: [facility];  Service: Thoracic;  Laterality: Right;   EXCISION RIGHT WRIST MASS  2012   KNEE ARTHROSCOPY Right 1999   MASS EXCISION Right 03/11/2014   Procedure: RIGHT WRIST DEEP MASS EXCISION WITH CULTURE AND BIOSPY;  Surgeon: Linna Hoff, MD;  Location: [facility];  Service: Orthopedics;  Laterality: Right;   REMOVAL OF PLEURAL DRAINAGE CATHETER Right 10/14/2014   Procedure: REMOVAL OF PLEURAL DRAINAGE CATHETER;  Surgeon: Grace Isaac, MD;  Location: [facility];  Service: Thoracic;  Laterality: Right;   TALC PLEURODESIS Right 09/16/2014   Procedure: TALC PLEURADESIS/ slurry;  Surgeon: Grace Isaac, MD;  Location: [facility];  Service: Thoracic;  Laterality: Right;   THORACOSCOPY  01-26-2009   w/   lung/  node biopsy's   THUMB ARTHROSCOPY Left    - removed bone spur   TONSILLECTOMY  AS CHILD   TOTAL ABDOMINAL HYSTERECTOMY W/ BILATERAL SALPINGOOPHORECTOMY  1982   W/  APPENDECTOMY   TRANSTHORACIC ECHOCARDIOGRAM  12-23-2008   NORMAL LV/  EF 65-70%/  MILD MR  &  TR   VAULT SUSPENSION PLUS CYSTOCELE REPAIR WITH GRAFT  06-13-2010     OB History   No obstetric history on file.      Home Medications    Prior to Admission medications   Medication Sig Start Date End Date Taking? Authorizing Provider  aspirin EC 81 MG tablet Take 81 mg by mouth daily.    [provider]  augmented betamethasone dipropionate (DIPROLENE-AF) 0.05 % ointment Apply 1 application topically 2 (two) times daily as needed. 08/22/17   [provider]  bismuth subsalicylate (PEPTO BISMOL) 262 MG/15ML suspension Take 30 mLs by mouth every three  (3) days as needed.    [provider]  Calcium Carb-Cholecalciferol (CALCIUM-VITAMIN D) 500-200 MG-UNIT tablet Take by mouth.    [provider]  Calcium Carbonate-Vitamin D 600-400 MG-UNIT per chew tablet Chew 1 tablet by mouth 2 (two) times daily.    [provider]  cholecalciferol (VITAMIN D) 1000 UNITS tablet Take 1,000 Units by mouth daily.     [provider]  clindamycin (CLEOCIN T) 1 % external solution APPLY  SOLUTION TOPICALLY TO AFFECTED AREA(S) TWICE DAILY 02/07/19   Curt Bears, MD  Coenzyme Q10 (COQ10) 100 MG CAPS Take 1 capsule by mouth daily.     [provider]  Cyanocobalamin (B-12) 1000 MCG CAPS Take 1,000 mcg by mouth 3 (three) times daily with meals.     [provider]  erlotinib (TARCEVA) 100 MG tablet Take 1 tablet (100 mg total) by mouth daily. Take on an empty stomach 1 hour before meals or 2 hours after 01/05/19   Curt Bears, MD  Hydrocodone-Acetaminophen (VICODIN) 5-300 MG TABS Take 1 tablet by mouth 4 (four) times daily as needed (for pain).     [provider]  hydroxychloroquine (PLAQUENIL) 200 MG tablet Take by mouth.    [provider]  ibandronate (BONIVA) 150 MG tablet  11/17/17   [provider]  loperamide (IMODIUM A-D) 2 MG tablet Take 1 mg by mouth 4 (four) times daily as needed for diarrhea or loose stools.     [provider]  loratadine (CLARITIN) 10 MG tablet Take 10 mg by mouth daily as needed for allergies.     [provider]  LORazepam (ATIVAN) 0.5 MG tablet Take 1 tablet (0.5 mg total) by mouth every 8 (eight) hours as needed for anxiety. 10/14/14   Curt Bears, MD  meloxicam (MOBIC) 15 MG tablet Take 15 mg by mouth daily. 04/30/18   [provider]  mirtazapine (REMERON) 15 MG tablet TAKE 1 TABLET BY MOUTH AT BEDTIME 05/26/19   Curt Bears, MD  oxyCODONE (OXY IR/ROXICODONE) 5 MG immediate release tablet Take 1 tablet (5 mg total)  by mouth every 4 (four) hours as needed for moderate pain. 07/15/14   Nita Sells, MD  Polyethyl Glycol-Propyl Glycol (SYSTANE OP) Place 1 drop into both eyes at bedtime as needed (for dry eyes.).     [provider]  psyllium (METAMUCIL) 58.6 % powder Take 1 packet by mouth daily as needed.    [provider]  Simethicone (GAS-X PO) Take 1 tablet by mouth 2 (two) times daily as needed (for gas).     [provider]  umeclidinium-vilanterol (ANORO ELLIPTA) 62.5-25 MCG/INH AEPB Inhale 1 puff into the lungs daily. 12/03/18   Tanda Rockers, MD    Family History Family History  Problem Relation  Age of Onset   Cancer Father        bladder   Cancer Son        kidney - removed as a teenager    Social History Social History   Tobacco Use   Smoking status: Former Smoker    Packs/day: 1.00    Years: 30.00    Pack years: 30.00    Types: Cigarettes    Last attempt to quit: 12/24/1977    Years since quitting: 41.4   Smokeless tobacco: Never Used  Substance Use Topics   Alcohol use: No   Drug use: No     Allergies   Amoxicillin   Review of Systems Review of Systems  All other systems reviewed and are negative.    Physical Exam Updated Vital Signs BP (!) 193/116 (BP Location: Left Arm)    Pulse (!) 112    Temp 98.7 F (37.1 C) (Oral)    Resp 16    Ht 5\' 4"  (1.626 m)    Wt 51.5 kg    SpO2 98%    BMI 19.48 kg/m   Physical Exam Vitals signs and nursing note reviewed.  Constitutional:      General: She is not in acute distress.    Appearance: She is well-developed. She is not diaphoretic.  HENT:     Head: Normocephalic and atraumatic.  Neck:     Musculoskeletal: Normal range of motion and neck supple.  Cardiovascular:     Rate and Rhythm: Normal rate and regular rhythm.     Heart sounds: No murmur. No friction rub. No gallop.   Pulmonary:     Effort: Pulmonary effort is normal. No respiratory distress.     Breath sounds: Normal  breath sounds. No wheezing.  Abdominal:     General: Bowel sounds are normal. There is no distension.     Palpations: Abdomen is soft.     Tenderness: There is abdominal tenderness.     Comments: There is mild generalized tenderness without rebound or guarding.  Musculoskeletal: Normal range of motion.  Skin:    General: Skin is warm and dry.  Neurological:     Mental Status: She is alert and oriented to person, place, and time.      ED Treatments / Results  Labs (all labs ordered are listed, but only abnormal results are displayed) Labs Reviewed  COMPREHENSIVE METABOLIC PANEL  LIPASE, BLOOD  CBC WITH DIFFERENTIAL/PLATELET  URINALYSIS, ROUTINE W REFLEX MICROSCOPIC    EKG None  Radiology No results found.  Procedures Procedures (including critical care time)  Medications Ordered in ED Medications  sodium chloride 0.9 % bolus 500 mL (has no administration in time range)  ondansetron (ZOFRAN) injection 4 mg (has no administration in time range)  morphine 4 MG/ML injection 4 mg (has no administration in time range)     Initial Impression / Assessment and Plan / ED Course  I have reviewed the triage vital signs and the nursing notes.  Pertinent labs & imaging results that were available during my care of the patient were reviewed by me and considered in my medical decision making (see chart for details).  Patient presenting with complaints of left-sided flank and abdominal pain.  Patient is undergoing a CT scan and disposition will be determined once the study is performed.  Dr. Florina Ou assumes care at shift change.  Final Clinical Impressions(s) / ED Diagnoses   Final diagnoses:  None    ED Discharge Orders    None  Veryl Speak, MD 05/29/19 [phone]

## 2019-05-28 NOTE — ED Notes (Signed)
Labs redrawn at this time, previous specimen clotted. Pt updated regarding delays. Reports relief with morphine but does not like the way it makes her feel.

## 2019-05-28 NOTE — ED Notes (Signed)
Assisted patient with bedside commode.  

## 2019-05-29 ENCOUNTER — Other Ambulatory Visit (HOSPITAL_COMMUNITY)
Admission: RE | Admit: 2019-05-29 | Discharge: 2019-05-29 | Disposition: A | Discharge status: Home / Self Care | Payer: Medicare Other | Source: Ambulatory Visit | Attending: Urology | Admitting: Urology

## 2019-05-29 ENCOUNTER — Other Ambulatory Visit: Payer: Self-pay | Admitting: Urology

## 2019-05-29 ENCOUNTER — Encounter (HOSPITAL_COMMUNITY): Payer: Self-pay | Admitting: *Deleted

## 2019-05-29 NOTE — Progress Notes (Addendum)
Spoke to patient via phone,history obtained,updated.  Bring blue folder,insurance cards,picture ID,designated driver and living will,POA, if desires (to be placed on chart). Reinforced no aspirin(instructions to hold aspirin per your doctor), ibuprofen products 72 hours prior to procedure. No vitamins or herbal medicines 7 days prior to procedure.   Follow laxative instructions provided by urologist (office) and in blue folder. Wear easy on/off clothing and no jewelry except wedding rings and ear rings. Leave all other valuables at home. Verbalizes understanding of instructions  No alcohol 24 hours prior to procedure.call md office if you have a cold,sorethroat or fever. Shower or bathe before your treatment. NPO after midnight.

## 2019-05-30 LAB — NOVEL CORONAVIRUS, NAA (HOSP ORDER, SEND-OUT TO REF LAB; TAT 18-24 HRS): SARS-CoV-2, NAA: NOT DETECTED

## 2019-05-30 LAB — URINE CULTURE: Culture: NO GROWTH

## 2019-06-01 ENCOUNTER — Encounter (HOSPITAL_COMMUNITY): Payer: Self-pay | Admitting: *Deleted

## 2019-06-01 ENCOUNTER — Ambulatory Visit (HOSPITAL_COMMUNITY): Payer: Medicare Other

## 2019-06-01 ENCOUNTER — Encounter (HOSPITAL_COMMUNITY)
Admission: RE | Disposition: A | Discharge status: Home / Self Care | Payer: Self-pay | Source: Home / Self Care | Attending: Urology

## 2019-06-01 ENCOUNTER — Other Ambulatory Visit: Payer: Self-pay

## 2019-06-01 ENCOUNTER — Ambulatory Visit (HOSPITAL_COMMUNITY)
Admission: RE | Admit: 2019-06-01 | Discharge: 2019-06-01 | Disposition: A | Discharge status: Home / Self Care | Payer: Medicare Other | Attending: Urology | Admitting: Urology

## 2019-06-01 DIAGNOSIS — N201 Calculus of ureter: Secondary | ICD-10-CM | POA: Diagnosis present

## 2019-06-01 DIAGNOSIS — Z7982 Long term (current) use of aspirin: Secondary | ICD-10-CM | POA: Insufficient documentation

## 2019-06-01 DIAGNOSIS — Z79899 Other long term (current) drug therapy: Secondary | ICD-10-CM | POA: Diagnosis not present

## 2019-06-01 DIAGNOSIS — Z79891 Long term (current) use of opiate analgesic: Secondary | ICD-10-CM | POA: Insufficient documentation

## 2019-06-01 DIAGNOSIS — C3490 Malignant neoplasm of unspecified part of unspecified bronchus or lung: Secondary | ICD-10-CM | POA: Diagnosis not present

## 2019-06-01 HISTORY — PX: EXTRACORPOREAL SHOCK WAVE LITHOTRIPSY: SHX1557

## 2019-06-01 SURGERY — LITHOTRIPSY, ESWL
Anesthesia: LOCAL | Laterality: Left

## 2019-06-01 MED ORDER — DIAZEPAM 5 MG PO TABS
10.0000 mg | ORAL_TABLET | ORAL | Status: AC
  Administered 2019-06-01: 10 mg via ORAL
  Filled 2019-06-01: qty 2

## 2019-06-01 MED ORDER — SODIUM CHLORIDE 0.9 % IV SOLN
INTRAVENOUS | Status: DC
  Administered 2019-06-01: 10:00:00 via INTRAVENOUS

## 2019-06-01 MED ORDER — CIPROFLOXACIN HCL 500 MG PO TABS
500.0000 mg | ORAL_TABLET | ORAL | Status: AC
  Administered 2019-06-01: 10:00:00 500 mg via ORAL
  Filled 2019-06-01: qty 1

## 2019-06-01 MED ORDER — DIPHENHYDRAMINE HCL 25 MG PO CAPS
25.0000 mg | ORAL_CAPSULE | ORAL | Status: AC
  Administered 2019-06-01: 10:00:00 25 mg via ORAL
  Filled 2019-06-01: qty 1

## 2019-06-02 ENCOUNTER — Encounter (HOSPITAL_COMMUNITY): Payer: Self-pay | Admitting: Urology

## 2019-06-12 ENCOUNTER — Ambulatory Visit (HOSPITAL_COMMUNITY)
Admission: RE | Admit: 2019-06-12 | Discharge: 2019-06-12 | Disposition: A | Discharge status: Home / Self Care | Payer: Medicare Other | Source: Ambulatory Visit | Attending: Internal Medicine | Admitting: Internal Medicine

## 2019-06-12 ENCOUNTER — Other Ambulatory Visit: Payer: Self-pay

## 2019-06-12 ENCOUNTER — Inpatient Hospital Stay: Payer: Medicare Other | Attending: Internal Medicine

## 2019-06-12 DIAGNOSIS — K219 Gastro-esophageal reflux disease without esophagitis: Secondary | ICD-10-CM | POA: Insufficient documentation

## 2019-06-12 DIAGNOSIS — Z7982 Long term (current) use of aspirin: Secondary | ICD-10-CM | POA: Diagnosis not present

## 2019-06-12 DIAGNOSIS — C3490 Malignant neoplasm of unspecified part of unspecified bronchus or lung: Secondary | ICD-10-CM

## 2019-06-12 DIAGNOSIS — J449 Chronic obstructive pulmonary disease, unspecified: Secondary | ICD-10-CM | POA: Diagnosis not present

## 2019-06-12 DIAGNOSIS — Z923 Personal history of irradiation: Secondary | ICD-10-CM | POA: Insufficient documentation

## 2019-06-12 DIAGNOSIS — M810 Age-related osteoporosis without current pathological fracture: Secondary | ICD-10-CM | POA: Diagnosis not present

## 2019-06-12 DIAGNOSIS — C342 Malignant neoplasm of middle lobe, bronchus or lung: Secondary | ICD-10-CM | POA: Insufficient documentation

## 2019-06-12 DIAGNOSIS — Z79899 Other long term (current) drug therapy: Secondary | ICD-10-CM | POA: Diagnosis not present

## 2019-06-12 DIAGNOSIS — D539 Nutritional anemia, unspecified: Secondary | ICD-10-CM | POA: Diagnosis not present

## 2019-06-12 DIAGNOSIS — I10 Essential (primary) hypertension: Secondary | ICD-10-CM | POA: Insufficient documentation

## 2019-06-12 DIAGNOSIS — M129 Arthropathy, unspecified: Secondary | ICD-10-CM | POA: Diagnosis not present

## 2019-06-12 DIAGNOSIS — Z9221 Personal history of antineoplastic chemotherapy: Secondary | ICD-10-CM | POA: Insufficient documentation

## 2019-06-12 DIAGNOSIS — M25531 Pain in right wrist: Secondary | ICD-10-CM | POA: Diagnosis not present

## 2019-06-12 DIAGNOSIS — F329 Major depressive disorder, single episode, unspecified: Secondary | ICD-10-CM | POA: Diagnosis not present

## 2019-06-12 DIAGNOSIS — K649 Unspecified hemorrhoids: Secondary | ICD-10-CM | POA: Diagnosis not present

## 2019-06-12 LAB — CBC WITH DIFFERENTIAL (CANCER CENTER ONLY)
Abs Immature Granulocytes: 0.01 10*3/uL (ref 0.00–0.07)
Basophils Absolute: 0 10*3/uL (ref 0.0–0.1)
Basophils Relative: 0 %
Eosinophils Absolute: 0.3 10*3/uL (ref 0.0–0.5)
Eosinophils Relative: 7 %
HCT: 35.5 % — ABNORMAL LOW (ref 36.0–46.0)
Hemoglobin: 11.5 g/dL — ABNORMAL LOW (ref 12.0–15.0)
Immature Granulocytes: 0 %
Lymphocytes Relative: 13 %
Lymphs Abs: 0.6 10*3/uL — ABNORMAL LOW (ref 0.7–4.0)
MCH: 31.7 pg (ref 26.0–34.0)
MCHC: 32.4 g/dL (ref 30.0–36.0)
MCV: 97.8 fL (ref 80.0–100.0)
Monocytes Absolute: 0.5 10*3/uL (ref 0.1–1.0)
Monocytes Relative: 11 %
Neutro Abs: 3.3 10*3/uL (ref 1.7–7.7)
Neutrophils Relative %: 69 %
Platelet Count: 300 10*3/uL (ref 150–400)
RBC: 3.63 MIL/uL — ABNORMAL LOW (ref 3.87–5.11)
RDW: 13.3 % (ref 11.5–15.5)
WBC Count: 4.7 10*3/uL (ref 4.0–10.5)
nRBC: 0 % (ref 0.0–0.2)

## 2019-06-12 LAB — CMP (CANCER CENTER ONLY)
ALT: 19 U/L (ref 0–44)
AST: 23 U/L (ref 15–41)
Albumin: 3.1 g/dL — ABNORMAL LOW (ref 3.5–5.0)
Alkaline Phosphatase: 49 U/L (ref 38–126)
Anion gap: 8 (ref 5–15)
BUN: 17 mg/dL (ref 8–23)
CO2: 28 mmol/L (ref 22–32)
Calcium: 8.9 mg/dL (ref 8.9–10.3)
Chloride: 104 mmol/L (ref 98–111)
Creatinine: 0.86 mg/dL (ref 0.44–1.00)
GFR, Est AFR Am: >60 mL/min (ref >60)
GFR, Estimated: >60 mL/min (ref >60)
Glucose, Bld: 85 mg/dL (ref 70–99)
Potassium: 3.8 mmol/L (ref 3.5–5.1)
Sodium: 140 mmol/L (ref 135–145)
Total Bilirubin: 0.9 mg/dL (ref 0.3–1.2)
Total Protein: 5.9 g/dL — ABNORMAL LOW (ref 6.5–8.1)

## 2019-06-12 MED ORDER — SODIUM CHLORIDE (PF) 0.9 % IJ SOLN
INTRAMUSCULAR | Status: AC
  Filled 2019-06-12: qty 50

## 2019-06-12 MED ORDER — IOHEXOL 300 MG/ML  SOLN
75.0000 mL | Freq: Once | INTRAMUSCULAR | Status: AC | PRN
  Administered 2019-06-12: 75 mL via INTRAVENOUS

## 2019-06-15 ENCOUNTER — Encounter: Payer: Self-pay | Admitting: Internal Medicine

## 2019-06-15 ENCOUNTER — Inpatient Hospital Stay (HOSPITAL_BASED_OUTPATIENT_CLINIC_OR_DEPARTMENT_OTHER): Payer: Medicare Other | Admitting: Internal Medicine

## 2019-06-15 ENCOUNTER — Telehealth: Payer: Self-pay | Admitting: Internal Medicine

## 2019-06-15 ENCOUNTER — Other Ambulatory Visit: Payer: Self-pay

## 2019-06-15 VITALS — BP 142/87 | HR 100 | Temp 99.0°F | Resp 20 | Ht 64.0 in | Wt 112.6 lb

## 2019-06-15 DIAGNOSIS — M25531 Pain in right wrist: Secondary | ICD-10-CM

## 2019-06-15 DIAGNOSIS — Z79899 Other long term (current) drug therapy: Secondary | ICD-10-CM

## 2019-06-15 DIAGNOSIS — C342 Malignant neoplasm of middle lobe, bronchus or lung: Secondary | ICD-10-CM

## 2019-06-15 DIAGNOSIS — J449 Chronic obstructive pulmonary disease, unspecified: Secondary | ICD-10-CM

## 2019-06-15 DIAGNOSIS — F329 Major depressive disorder, single episode, unspecified: Secondary | ICD-10-CM

## 2019-06-15 DIAGNOSIS — D539 Nutritional anemia, unspecified: Secondary | ICD-10-CM

## 2019-06-15 DIAGNOSIS — M129 Arthropathy, unspecified: Secondary | ICD-10-CM | POA: Diagnosis not present

## 2019-06-15 DIAGNOSIS — K219 Gastro-esophageal reflux disease without esophagitis: Secondary | ICD-10-CM

## 2019-06-15 DIAGNOSIS — M810 Age-related osteoporosis without current pathological fracture: Secondary | ICD-10-CM

## 2019-06-15 DIAGNOSIS — Z9221 Personal history of antineoplastic chemotherapy: Secondary | ICD-10-CM

## 2019-06-15 DIAGNOSIS — Z5181 Encounter for therapeutic drug level monitoring: Secondary | ICD-10-CM

## 2019-06-15 DIAGNOSIS — K649 Unspecified hemorrhoids: Secondary | ICD-10-CM

## 2019-06-15 DIAGNOSIS — Z923 Personal history of irradiation: Secondary | ICD-10-CM

## 2019-06-15 DIAGNOSIS — C3491 Malignant neoplasm of unspecified part of right bronchus or lung: Secondary | ICD-10-CM

## 2019-06-15 DIAGNOSIS — I10 Essential (primary) hypertension: Secondary | ICD-10-CM

## 2019-06-15 DIAGNOSIS — Z7982 Long term (current) use of aspirin: Secondary | ICD-10-CM

## 2019-06-15 NOTE — Progress Notes (Signed)
[facility] [phone]   [phone]  OFFICE PROGRESS NOTE  [name], MD [place] [address]  DIAGNOSIS: Stage IIIB (T2a, N3, M0) non-small cell lung cancer, adenocarcinoma with positive EGFR mutation diagnosed in January 2010.  PRIOR THERAPY: 1) Status post concurrent chemoradiation with weekly carboplatin and paclitaxel; last dose given March 21, 2009.  2) Tarceva at 150 mg p.o. daily, status post approximately 48 months of treatment, discontinued secondary to persistent diarrhea.  3) status post right Pleurx catheter placement for right nonmalignant pleural effusion.  CURRENT THERAPY: Tarceva 100 mg by mouth daily started 03/19/2013, status post 75 months of treatment.  INTERVAL HISTORY: Dana [name] 84 y.o. female returns to the clinic today for follow-up visit..  The patient is feeling fine today with no concerning complaints except for shortness of breath with exertion.  She is currently on Anoro by Dr. Melvyn Novas.  She was recently diagnosed with left ureteric renal stone status post lithotripsy.  He denied having any chest pain, cough or hemoptysis.  She denied having any fever or chills.  She has no nausea, vomiting, diarrhea or constipation.  She denied having any headache or visual changes.  The patient had repeat CT scan of the chest performed recently and she is here for evaluation and discussion of her scan results.  MEDICAL HISTORY: Past Medical History:  Diagnosis Date   Arthritis    HANDS,  WRISTS   COPD (chronic obstructive pulmonary disease) ([place])    Depression 04/04/2017   Dyspnea on exertion    Encounter for therapeutic drug monitoring 10/01/2016   GERD (gastroesophageal reflux disease)    Hemorrhoid    History of anal fissures    History of lung cancer ONCOLOGIST--  DR [name]--  LAST CT ,  NO RECURRENCE OR METS   DX JAN 2010 --  STAGE IIIA  NON-SMALL CELL ADENOCARCINOMA (RIGHT MIDDLE  LOBE)---  S/P  CHEMORADIATIO THERAPY  (COMPLETE 03-21-2009)   HTN (hypertension) 10/10/2017   Imbalance 06/04/2017   lung ca dx'd 2010   Normal cardiac stress test    2007  PER PT   Osteoporosis    Rash, skin    RIGHT FOREARM/ HAND   Right wrist pain    MASS   Wears glasses     ALLERGIES:  is allergic to amoxicillin.  MEDICATIONS:  Current Outpatient Medications  Medication Sig Dispense Refill   aspirin EC 81 MG tablet Take 81 mg by mouth daily.     augmented betamethasone dipropionate (DIPROLENE-AF) 0.05 % ointment Apply 1 application topically 2 (two) times daily as needed.     bismuth subsalicylate (PEPTO BISMOL) 262 MG/15ML suspension Take 30 mLs by mouth every three (3) days as needed.     Calcium Carb-Cholecalciferol (CALCIUM-VITAMIN D) 500-200 MG-UNIT tablet Take by mouth.     Calcium Carbonate-Vitamin D 600-400 MG-UNIT per chew tablet Chew 1 tablet by mouth 2 (two) times daily.     cephALEXin (KEFLEX) 500 MG capsule Take 1 capsule (500 mg total) by mouth 4 (four) times daily. 28 capsule 0   cholecalciferol (VITAMIN D) 1000 UNITS tablet Take 1,000 Units by mouth daily.      clindamycin (CLEOCIN T) 1 % external solution APPLY  SOLUTION TOPICALLY TO AFFECTED AREA(S) TWICE DAILY 60 mL 0   Coenzyme Q10 (COQ10) 100 MG CAPS Take 1 capsule by mouth daily.      Cyanocobalamin (B-12) 1000 MCG CAPS Take 1,000 mcg by mouth  3 (three) times daily with meals.      erlotinib (TARCEVA) 100 MG tablet Take 1 tablet (100 mg total) by mouth daily. Take on an empty stomach 1 hour before meals or 2 hours after 30 tablet 5   hydroxychloroquine (PLAQUENIL) 200 MG tablet Take by mouth.     ibandronate (BONIVA) 150 MG tablet      loperamide (IMODIUM A-D) 2 MG tablet Take 1 mg by mouth 4 (four) times daily as needed for diarrhea or loose stools.      loratadine (CLARITIN) 10 MG tablet Take 10 mg by mouth daily as needed for allergies.      LORazepam (ATIVAN) 0.5 MG tablet Take 1  tablet (0.5 mg total) by mouth every 8 (eight) hours as needed for anxiety. 30 tablet 0   meloxicam (MOBIC) 15 MG tablet Take 15 mg by mouth daily.  4   mirtazapine (REMERON) 15 MG tablet TAKE 1 TABLET BY MOUTH AT BEDTIME 30 tablet 0   ondansetron (ZOFRAN ODT) 8 MG disintegrating tablet Take 1 tablet (8 mg total) by mouth every 8 (eight) hours as needed for nausea or vomiting. 10 tablet 1   oxyCODONE-acetaminophen (PERCOCET) 5-325 MG tablet Take 1 tablet by mouth every 4 (four) hours as needed (for pain; may cause constipation). 30 tablet 0   Polyethyl Glycol-Propyl Glycol (SYSTANE OP) Place 1 drop into both eyes at bedtime as needed (for dry eyes.).      psyllium (METAMUCIL) 58.6 % powder Take 1 packet by mouth daily as needed.     Simethicone (GAS-X PO) Take 1 tablet by mouth 2 (two) times daily as needed (for gas).      umeclidinium-vilanterol (ANORO ELLIPTA) 62.5-25 MCG/INH AEPB Inhale 1 puff into the lungs daily. 60 each 11   No current facility-administered medications for this visit.     SURGICAL HISTORY:  Past Surgical History:  Procedure Laterality Date   ANAL FISSURE REPAIR  07/09/2011   INTERNAL SPHINCTEROTOMY   BENIGN EXCISION LEFT BREAST CENTRAL DUCT  02/1999   BREAST BIOPSY  01/31/2009   benign   BREAST EXCISIONAL BIOPSY Left 2004   benign   CHEST TUBE INSERTION Right 07/14/2014   Procedure: INSERTION PLEURAL DRAINAGE CATHETER RIGHT CHEST;  Surgeon: Grace Isaac, MD;  Location: [facility];  Service: Thoracic;  Laterality: Right;   EXCISION RIGHT WRIST MASS  2012   EXTRACORPOREAL SHOCK WAVE LITHOTRIPSY Left 06/01/2019   Procedure: EXTRACORPOREAL SHOCK WAVE LITHOTRIPSY (ESWL);  Surgeon: Cleon Gustin, MD;  Location: [facility];  Service: Urology;  Laterality: Left;   KNEE ARTHROSCOPY Right 1999   MASS EXCISION Right 03/11/2014   Procedure: RIGHT WRIST DEEP MASS EXCISION WITH CULTURE AND BIOSPY;  Surgeon: Linna Hoff, MD;  Location: [facility];   Service: Orthopedics;  Laterality: Right;   REMOVAL OF PLEURAL DRAINAGE CATHETER Right 10/14/2014   Procedure: REMOVAL OF PLEURAL DRAINAGE CATHETER;  Surgeon: Grace Isaac, MD;  Location: [facility];  Service: Thoracic;  Laterality: Right;   TALC PLEURODESIS Right 09/16/2014   Procedure: TALC PLEURADESIS/ slurry;  Surgeon: Grace Isaac, MD;  Location: [facility];  Service: Thoracic;  Laterality: Right;   THORACOSCOPY  01-26-2009   w/   lung/  node biopsy's   THUMB ARTHROSCOPY Left    - removed bone spur   TONSILLECTOMY  AS CHILD   TOTAL ABDOMINAL HYSTERECTOMY W/ BILATERAL SALPINGOOPHORECTOMY  1982   W/  APPENDECTOMY   TRANSTHORACIC ECHOCARDIOGRAM  12-23-2008   NORMAL LV/  EF 65-70%/  MILD MR  &  TR   VAULT SUSPENSION PLUS CYSTOCELE REPAIR WITH GRAFT  06-13-2010    REVIEW OF SYSTEMS:  Constitutional: positive for fatigue Eyes: negative Ears, nose, mouth, throat, and face: negative Respiratory: positive for dyspnea on exertion Cardiovascular: negative Gastrointestinal: negative Genitourinary:negative Integument/breast: negative Hematologic/lymphatic: negative Musculoskeletal:negative Neurological: negative Behavioral/Psych: negative Endocrine: negative Allergic/Immunologic: negative   PHYSICAL EXAMINATION: General appearance: alert, cooperative, fatigued and no distress Head: Normocephalic, without obvious abnormality, atraumatic Neck: no adenopathy, no JVD, supple, symmetrical, trachea midline and thyroid not enlarged, symmetric, no tenderness/mass/nodules Lymph nodes: Cervical, supraclavicular, and axillary nodes normal. Resp: clear to auscultation bilaterally Back: symmetric, no curvature. ROM normal. No CVA tenderness. Cardio: regular rate and rhythm, S1, S2 normal, no murmur, click, rub or gallop GI: soft, non-tender; bowel sounds normal; no masses,  no organomegaly Extremities: extremities normal, atraumatic, no cyanosis or edema Neurologic: Alert and oriented X  3, normal strength and tone. Normal symmetric reflexes. Normal coordination and gait  ECOG PERFORMANCE STATUS: 1 - Symptomatic but completely ambulatory  Blood pressure (!) 142/87, pulse 100, temperature 99 F (37.2 C), temperature source Oral, resp. rate 20, height _0  (1.626 m), weight 112 lb 9.6 oz (51.1 kg), SpO2 98 %.  LABORATORY DATA: Lab Results  Component Value Date   WBC 4.7 06/12/2019   HGB 11.5 (L) 06/12/2019   HCT 35.5 (L) 06/12/2019   MCV 97.8 06/12/2019   PLT 300 06/12/2019      Chemistry      Component Value Date/Time   NA 140 06/12/2019 0903   NA 142 12/10/2017 1000   K 3.8 06/12/2019 0903   K 4.0 12/10/2017 1000   CL 104 06/12/2019 0903   CL 108 (H) 06/03/2013 0939   CO2 28 06/12/2019 0903   CO2 28 12/10/2017 1000   BUN 17 06/12/2019 0903   BUN 20.4 12/10/2017 1000   CREATININE 0.86 06/12/2019 0903   CREATININE 0.9 12/10/2017 1000      Component Value Date/Time   CALCIUM 8.9 06/12/2019 0903   CALCIUM 9.7 12/10/2017 1000   ALKPHOS 49 06/12/2019 0903   ALKPHOS 63 12/10/2017 1000   AST 23 06/12/2019 0903   AST 25 12/10/2017 1000   ALT 19 06/12/2019 0903   ALT 17 12/10/2017 1000   BILITOT 0.9 06/12/2019 0903   BILITOT 1.05 12/10/2017 1000       RADIOGRAPHIC STUDIES: Dg Abd 1 View  Result Date: 06/01/2019 CLINICAL DATA:  Pre lithotripsy, left ureteral stone EXAM: ABDOMEN - 1 VIEW COMPARISON:  CT abdomen/pelvis dated 05/28/2019 FINDINGS: 6 mm calcification at the L4-5 level, likely corresponding to the patient's known left ureteral calculus on CT. IMPRESSION: 6 mm left ureteral calculus at the L4-5 level. Electronically Signed   By: Julian Hy M.D.   On: 06/01/2019 09:40   Ct Chest W Contrast  Result Date: 06/12/2019 CLINICAL DATA:  Right lung cancer, chemotherapy and XRT complete in 2010, oral chemotherapy ongoing. Shortness of breath on exertion. EXAM: CT CHEST WITH CONTRAST TECHNIQUE: Multidetector CT imaging of the chest was performed  during intravenous contrast administration. CONTRAST:  61m OMNIPAQUE IOHEXOL 300 MG/ML  SOLN COMPARISON:  None. FINDINGS: Cardiovascular: Heart is normal in size.  No pericardial effusion. No evidence of thoracic aortic aneurysm. Atherosclerotic calcifications of the aortic arch. Three vessel coronary atherosclerosis. Mediastinum/Nodes: Small mediastinal lymph nodes, including a 10 mm short axis subcarinal node. Visualized thyroid is unremarkable. Lungs/Pleura: Volume loss with radiation changes in the right hemithorax. Associated interlobular  septal thickening in the right lower lobe, likely reflecting post treatment changes. Trace right pleural effusion with pleural calcifications, likely reflecting sequela of prior pleurodesis. 12 x 16 mm sub solid nodule in the left lung apex (series 5/image 19) with 5 mm solid component, previously 12 x 15 mm. 12 x 11 mm mixed density nodule in the anterior left upper lobe (series 5/image 58), previously 9 x 9 mm. 3 mm subpleural nodule in the posterior right upper lobe (series 5/image 46), unchanged. Biapical pleural-parenchymal scarring. No pneumothorax. Upper Abdomen: Visualized upper abdomen is grossly unremarkable, noting vascular calcifications. Musculoskeletal: Degenerative changes of the visualized thoracolumbar spine. IMPRESSION: Two subsolid nodules in the left upper lobe may be minimally progressive. Continued follow-up CT chest is suggested in 6 months. Radiation changes in the right hemithorax. No findings specific for recurrent or metastatic disease. 10 mm short axis subcarinal node, unchanged. Attention on follow-up suggested. Additional ancillary findings as above. Aortic Atherosclerosis (ICD10-I70.0). Electronically Signed   By: Julian Hy M.D.   On: 06/12/2019 09:50   Ct Abdomen Pelvis W Contrast  Result Date: 05/28/2019 CLINICAL DATA:  84 year old female with abdominal pain and left flank pain. History of lung cancer status post chemo and  radiation. EXAM: CT ABDOMEN AND PELVIS WITH CONTRAST TECHNIQUE: Multidetector CT imaging of the abdomen and pelvis was performed using the standard protocol following bolus administration of intravenous contrast. CONTRAST:  55m OMNIPAQUE IOHEXOL 300 MG/ML  SOLN COMPARISON:  CT of the abdomen pelvis dated 02/14/2011 FINDINGS: Lower chest: Partially visualized areas of scarring of the right lung base and right hilar region likely post treatment fibrosis. There are areas of calcification along the right diaphragmatic pleural surface. The visualized left lung base is clear. There is mild cardiomegaly. Coronary vascular calcifications involving the RCA. No intra-abdominal free air or free fluid. Hepatobiliary: The liver is unremarkable. There is layering stones within the gallbladder. No pericholecystic fluid. Pancreas: Unremarkable. No pancreatic ductal dilatation or surrounding inflammatory changes. Spleen: Normal in size without focal abnormality. Adrenals/Urinary Tract: The adrenal glands are unremarkable. There is an 8 mm stone in the proximal left ureter with mild left hydronephrosis. There is small to moderate amount of urine in the left perinephric space. There is extravasation of the contrast on delayed images from the left renal pelvis in keeping with caliceal rupture. There is no hydronephrosis on the right. There is an extrarenal pelvis with mild pelviectasis on the right. The right ureter and urinary bladder appear unremarkable. Stomach/Bowel: There is a small hiatal hernia. There is no bowel obstruction or active inflammation. The appendix is not visualized with certainty. No inflammatory changes identified in the right lower quadrant. Vascular/Lymphatic: There is advanced aortoiliac atherosclerotic disease. No portal venous gas. There is no adenopathy. Reproductive: Hysterectomy. No pelvic mass. Other: None Musculoskeletal: Osteopenia with scoliosis and degenerative changes of the spine. No acute osseous  pathology. IMPRESSION: 1. An 8 mm proximal left ureteral stone with mild left hydronephrosis. There is associated calyceal rupture and a small to moderate left perinephric urine leak. 2. Cholelithiasis. Electronically Signed   By: AAnner CreteM.D.   On: 05/28/2019 23:14    ASSESSMENT AND PLAN:  This is a very pleasant 84years old white female with stage IIIB non-small cell lung cancer, adenocarcinoma with positive EGFR mutation diagnosed in January 2010. She has been on treatment with Tarceva for more than 9 years.   She is currently on Tarceva 100 mg p.o. daily status post 75 months. The patient has been  tolerating her treatment well with no concerning adverse effects. She had repeat CT scan of the chest performed recently.  I personally and independently reviewed the scan images and discussed the results with the patient today. Her scan showed no concerning findings for disease progression except for minimally enlarged left upper lobe pulmonary nodules. I recommended for the patient to continue her current treatment with Tarceva 100 mg p.o. daily. I will see her back for follow-up visit in 2 months for evaluation and repeat blood work. For the history of kidney stone, she has follow-up visit with urology tomorrow. The patient was advised to call immediately if she has any concerning symptoms in the interval. The patient voices understanding of current disease status and treatment options and is in agreement with the current care plan. All questions were answered. The patient knows to call the clinic with any problems, questions or concerns. We can certainly see the patient much sooner if necessary. Disclaimer: This note was dictated with voice recognition software. Similar sounding words can inadvertently be transcribed and may not be corrected upon review.

## 2019-06-15 NOTE — Telephone Encounter (Signed)
Scheduled appt per 6/22 los. Printed calendar and avs.

## 2019-06-18 ENCOUNTER — Ambulatory Visit: Payer: Medicare Other

## 2019-06-22 ENCOUNTER — Encounter: Payer: Self-pay | Admitting: Internal Medicine

## 2019-06-22 ENCOUNTER — Ambulatory Visit (INDEPENDENT_AMBULATORY_CARE_PROVIDER_SITE_OTHER): Payer: Medicare Other | Admitting: Internal Medicine

## 2019-06-22 ENCOUNTER — Other Ambulatory Visit: Payer: Self-pay

## 2019-06-22 DIAGNOSIS — R0609 Other forms of dyspnea: Secondary | ICD-10-CM

## 2019-06-22 DIAGNOSIS — R0902 Hypoxemia: Secondary | ICD-10-CM | POA: Diagnosis not present

## 2019-06-22 MED ORDER — PANTOPRAZOLE SODIUM 40 MG PO TBEC: 40.0000 mg | DELAYED_RELEASE_TABLET | Freq: Every day | ORAL | 2 refills | Status: DC

## 2019-06-22 MED ORDER — PREDNISONE 10 MG PO TABS: ORAL_TABLET | ORAL | 0 refills | Status: DC

## 2019-06-22 MED ORDER — STIOLTO RESPIMAT 2.5-2.5 MCG/ACT IN AERS: 2.0000 | INHALATION_SPRAY | Freq: Every day | RESPIRATORY_TRACT | 0 refills | Status: DC

## 2019-06-22 NOTE — Patient Instructions (Signed)
Stop anoro and start stiolto 2 puffs each am in its place  Prednisone 10 mg take  4 each am x 2 days,   2 each am x 2 days,  1 each am x 2 days and stop   Work on inhaler technique:  relax and gently blow all the way out then take a nice smooth deep breath back in, triggering the inhaler at same time you start breathing in.  Hold for up to 5 seconds if you can.  . Rinse and gargle with water when done  Stop boniva after this month  Protonix 40 mg Take 30-60 min before first meal of the day daily    Please schedule a follow up office visit in 2 weeks, sooner if needed

## 2019-06-22 NOTE — Progress Notes (Signed)
Dana [name], female    DOB: 1935/12/03,    MRN: [id number]    Brief patient profile:  62 yowf mother of primary care physician in [place] quit smoking 1979 with chronic doe prior  evals:   07/05/2016  Walked RA x 3 laps @ 185 ft each stopped due to  End of study, nl pace no desat or sob  - Spirometry 07/05/2016  wnl p am spiriva  - CTa chest 07/05/2016 neg pe/ neg ILD, R infrahilar density c/w lung ca vs post RT scar     Oncology note 11/17/18 : DIAGNOSIS: Stage IIIB (T2a, N3, M0) non-small cell lung cancer, adenocarcinoma with positive EGFR mutation diagnosed in January 2010.  PRIOR THERAPY: 1) Status post concurrent chemoradiation with weekly carboplatin and paclitaxel; last dose given March 21, 2009.  2) Tarceva at 150 mg p.o. daily, status post approximately 48 months of treatment, discontinued secondary to persistent diarrhea.  3) status post right Pleurx catheter placement for right nonmalignant pleural effusion. 4) R pleurex Gerhardt 2017 / sp pleurodesis  CURRENT THERAPY: Tarceva 100 mg by mouth daily started 03/19/2013, status post 68 months of treatment.     History of Present Illness  11/25/2018  Pulmonary consultation / Dr Blima Singer re sob   Chief Complaint  Patient presents with  . Pulmonary Consult    Referred by Theadore Nan. Pt c/o SOB x 10 yrs, worse x 3 months. She gets out of breath walking up a flight of stairs.   Dyspnea:  Indolent onset/ As of 3 months prior to [place] worse" to point where struggles step to step and stops at top of one flight  Still able to shop at [employer] / does not check sats  Cough: none Sleep: fine flat on one pillow SABA use: none  rec Goal is to keep your 02 saturation above 90% by walking slower or adjusting 02 flow to reach at least 90%      12/03/2018  f/u ov/Wert re: sob Chief Complaint  Patient presents with  . Follow-up    She states breathing seems better but she has not been as active. She  was started on o2 per PCP- she only uses when she is at home during the day, none with sleep.   Dyspnea:  Has not tried steps yet but seems easier room to room 2lpm and  Typically maintaining > 90% when she checks it  Cough: none Sleeping: fine  SABA use: none 02: 2lpm prn daytime not at hs  rec Start anoro one click each am  - take two good deep drags then rinse and gargle  Adjust 02 saturations to keep it above 90%     01/15/2019  f/u ov/Wert re: copd gold 0 / ex hypoxemia  Chief Complaint  Patient presents with  . Follow-up    Dyspnea on exertion  Dyspnea:  MMRC1 = can walk nl pace, flat grade, can't hurry or go uphills or steps s sob  Cough: some in am p anoro Sleeping: ok almost flat SABA use: none 02: none at all since last ov  rec No change anoro   05/08/2019  f/u ov/Wert re: worse sob / GOLD O on Anoro/ no longer using 02  Chief Complaint  Patient presents with  . Follow-up    Breathing has been worse since end of April 2020.    Dyspnea:  No sob  at rest if quiet ie not talking but seems to lose  her breath now if talks too much  / mb and back /couple of hundred feet and incline back can still do s stopping but r knee limiting and sats are fine  when she remembers to check them  Cough: none  Sleeping: flat ok / one pillow  SABA use: none  02:  None  Note having new HB despite nexium on Bonivo monthly  rec No change in medications  If breathing is worse the week after Bonivo, consider Reclast or Prolia  Continue to monitor your saturation with activity and call if trending down     06/22/2019  f/u ov/Wert re: worse doe on anoro  Chief Complaint  Patient presents with  . Follow-up    Breathing has been worse since the last visit. She states she feels exhausted and is afraid to go out anywhere alone. She has prod cough with clear sputum.   Dyspnea:  MMRC3 = can't walk 100 yards even at a slow pace at a flat grade s stopping due to sob   Cough: throat clearing esp  after anoro Sleeping: ok flat fine SABA use: none 02: none    No obvious day to day or daytime variability or assoc excess/ purulent sputum or mucus plugs or hemoptysis or cp or chest tightness, subjective wheeze or overt sinus or hb symptoms.   Sleeping  without nocturnal  or early am exacerbation  of respiratory  c/o's or need for noct saba. Also denies any obvious fluctuation of symptoms with weather or environmental changes or other aggravating or alleviating factors except as outlined above   No unusual exposure hx or h/o childhood pna/ asthma or knowledge of premature birth.  Current Allergies, Complete Past Medical History, Past Surgical History, Family History, and Social History were reviewed in [facility] record.  ROS  The following are not active complaints unless bolded Hoarseness, sore throat, dysphagia, dental problems, itching, sneezing,  nasal congestion or discharge of excess mucus or purulent secretions, ear ache,   fever, chills, sweats, unintended wt loss or wt gain, classically pleuritic or exertional cp,  orthopnea pnd or arm/hand swelling  or leg swelling, presyncope, palpitations, abdominal pain, anorexia, nausea, vomiting, diarrhea  or change in bowel habits or change in bladder habits, change in stools or change in urine, dysuria, hematuria,  rash, arthralgias, visual complaints, headache, numbness, weakness or ataxia or problems with walking or coordination,  change in mood or  memory.        Current Meds  Medication Sig  . aspirin EC 81 MG tablet Take 81 mg by mouth daily.  Marland Kitchen augmented betamethasone dipropionate (DIPROLENE-AF) 0.05 % ointment Apply 1 application topically 2 (two) times daily as needed.  . bismuth subsalicylate (PEPTO BISMOL) 262 MG/15ML suspension Take 30 mLs by mouth every three (3) days as needed.  . Calcium Carb-Cholecalciferol (CALCIUM-VITAMIN D) 500-200 MG-UNIT tablet Take by mouth.  . Calcium Carbonate-Vitamin D  600-400 MG-UNIT per chew tablet Chew 1 tablet by mouth 2 (two) times daily.  . cholecalciferol (VITAMIN D) 1000 UNITS tablet Take 1,000 Units by mouth daily.   . clindamycin (CLEOCIN T) 1 % external solution APPLY  SOLUTION TOPICALLY TO AFFECTED AREA(S) TWICE DAILY  . Coenzyme Q10 (COQ10) 100 MG CAPS Take 1 capsule by mouth daily.   . Cyanocobalamin (B-12) 1000 MCG CAPS Take 1,000 mcg by mouth 3 (three) times daily with meals.   . diclofenac sodium (VOLTAREN) 1 % GEL Apply 2 g topically 4 (four) times daily.  Marland Kitchen  erlotinib (TARCEVA) 100 MG tablet Take 1 tablet (100 mg total) by mouth daily. Take on an empty stomach 1 hour before meals or 2 hours after  . ibandronate (BONIVA) 150 MG tablet   . loperamide (IMODIUM A-D) 2 MG tablet Take 1 mg by mouth 4 (four) times daily as needed for diarrhea or loose stools.   Marland Kitchen loratadine (CLARITIN) 10 MG tablet Take 10 mg by mouth daily as needed for allergies.   Marland Kitchen LORazepam (ATIVAN) 0.5 MG tablet Take 1 tablet (0.5 mg total) by mouth every 8 (eight) hours as needed for anxiety.  . meloxicam (MOBIC) 15 MG tablet Take 15 mg by mouth daily.  . mirtazapine (REMERON) 15 MG tablet TAKE 1 TABLET BY MOUTH AT BEDTIME  . ondansetron (ZOFRAN ODT) 8 MG disintegrating tablet Take 1 tablet (8 mg total) by mouth every 8 (eight) hours as needed for nausea or vomiting.  Marland Kitchen oxyCODONE-acetaminophen (PERCOCET) 5-325 MG tablet Take 1 tablet by mouth every 4 (four) hours as needed (for pain; may cause constipation).  Vladimir Faster Glycol-Propyl Glycol (SYSTANE OP) Place 1 drop into both eyes at bedtime as needed (for dry eyes.).   Marland Kitchen psyllium (METAMUCIL) 58.6 % powder Take 1 packet by mouth daily as needed.  . Simethicone (GAS-X PO) Take 1 tablet by mouth 2 (two) times daily as needed (for gas).   Marland Kitchen umeclidinium-vilanterol (ANORO ELLIPTA) 62.5-25 MCG/INH AEPB Inhale 1 puff into the lungs daily.               Objective:     Thin amb slt hoarse wf nad   06/22/2019        114   05/08/2019        115  12/03/2018      120  11/25/18 116 lb (52.6 kg)  11/17/18 117 lb 6.4 oz (53.3 kg)  09/16/18 117 lb 4.8 oz (53.2 kg)    Vital signs reviewed - Note on arrival 02 sats  97% on RA      HEENT: nl  oropharynx with  nl dentition. Nl external ear canals without cough reflex -  Mild bilateral non-specific turbinate edema     NECK :  without JVD/Nodes/TM/ nl carotid upstrokes bilaterally   LUNGS: no acc muscle use,  Min barrel  contour chest wall with bilateral  Distant bs (more so on R)  s audible wheeze and  without cough on insp or exp maneuver and min  Hyperresonant  to  percussion bilaterally     CV:  RRR  no s3 or murmur or increase in P2, and no edema   ABD:  soft and nontender with pos late insp Hoover's  in the supine position. No bruits or organomegaly appreciated, bowel sounds nl  MS:   Nl gait/  ext warm with R A changes esp R wrist/hand, calf tenderness, cyanosis or clubbing    SKIN: warm and dry without lesions    NEURO:  alert, approp, nl sensorium with  no motor or cerebellar deficits apparent          I personally reviewed images and agree with radiology impression as follows:   Chest CT 06/12/19 w/o contrast Two subsolid nodules in the left upper lobe may be minimally progressive. Continued follow-up CT chest is suggested in 6 months.  Radiation changes in the right hemithorax. No findings specific for recurrent or metastatic disease. 10 mm short axis subcarinal node, unchanged.        Assessment

## 2019-06-23 ENCOUNTER — Encounter: Payer: Self-pay | Admitting: Internal Medicine

## 2019-06-23 NOTE — Assessment & Plan Note (Addendum)
Quit smoking 1979 07/05/2016  Walked RA x 3 laps @ 185 ft each stopped due to  End of study, nl pace no desat or sob  - Spirometry 07/05/2016  wnl p am spiriva  - CTa chest 07/05/2016 neg pe/ neg ILD, R infrahilar density c/w lung ca vs post RT scar   Chest CT w contrast  09/12/18  Stable CT of the chest - 11/25/2018   Walked RA  2 laps @ 239ft each @ moderate pace  stopped due to  desats to 86% corrected on 2lpm  - HRCT  11/28/18 1. No findings to suggest interstitial lung disease. 2. Chronic postradiation changes in the right lung and evidence of prior right-sided pleurodesis redemonstrated, as above. - PFT's  12/03/2018  FEV1 1.53 (86 % ) ratio 80  p 18 % improvement from saba p nothing prior to study with DLCO  52/52 % corrects to 80 % for alv volume  With minimal curvature  - 06/22/2019   Walked RA  2 laps @  approx 219ft each @ slow to moderate pace  stopped due to  End of study, min sob, no desats   - 06/22/2019  After extensive coaching inhaler device,  effectiveness =    75% with dpi > try stiolto instead of anoro x 2 weeks   Symptoms are   disproportionate to objective findings and not clear to what extent this is actually a pulmonary  problem but pt does appear to have difficult to sort out respiratory symptoms of unknown origin for which  DDX  = almost all start with A and  include Adherence, Ace Inhibitors, Acid Reflux, Active Sinus Disease, Alpha 1 Antitripsin deficiency, Anxiety masquerading as Airways dz,  ABPA,  Allergy(esp in young), Aspiration (esp in elderly), Adverse effects of meds,  Active smoking or Vaping, A bunch of PE's/clot burden (a few small clots can't cause this syndrome unless there is already severe underlying pulm or vascular dz with poor reserve),  Anemia or thyroid disorder, plus two Bs  = Bronchiectasis and Beta blocker use..and one C= CHF     Adherence is always the initial "prime suspect" and is a multilayered concern that requires a "trust but verify" approach in  every patient - starting with knowing how to use medications, especially inhalers, correctly, keeping up with refills and understanding the fundamental difference between maintenance and prns vs those medications only taken for a very short course and then stopped and not refilled.  - see smi teaching - return with all meds in hand using a trust but verify approach to confirm accurate Medication  Reconciliation The principal here is that until we are certain that the  patients are doing what we've asked, it makes no sense to ask them to do more.   ? Acid (or non-acid) GERD > always difficult to exclude as up to 75% of pts in some series report no assoc GI/ Heartburn symptoms> rec max (24h)  acid suppression and diet restrictions/ reviewed and instructions given in writing and try off boniva for now  ? Adverse effects of dpi > try off   ? Allergy/ asthma > doubt but cover with Prednisone 10 mg take  4 each am x 2 days,   2 each am x 2 days,  1 each am x 2 days and stop / continue clariton prn   Not really clear why she needs laba/lama based on above studies but she's convinced the anoro initially helped and now may be just  aggravating her chronic cough so rec change to smi x 2 week trial then regroup off boniva and on gerd rx empirically in the meantime

## 2019-06-23 NOTE — Assessment & Plan Note (Addendum)
See walking study 11/25/2018 desats on RA corrected on 2lpm    - 01/15/2019 refused to continue to wear any 02 at all > d/c'd - no desats walking 06/22/2019   No need for 02    I had an extended discussion with the patient reviewing all relevant studies completed to date and  lasting 15 to 20 minutes of a 25 minute visit     I directly observed portions of ambulatory 02 saturation study/   teaching which extended face to face time for this visit.  Each maintenance medication was reviewed in detail including emphasizing most importantly the difference between maintenance and prns and under what circumstances the prns are to be triggered using an action plan format that is not reflected in the computer generated alphabetically organized AVS which I have not found useful in most complex patients, especially with respiratory illnesses  Please see AVS for specific instructions unique to this visit that I personally wrote and verbalized to the the pt in detail and then reviewed with pt  by my nurse highlighting any  changes in therapy recommended at today's visit to their plan of care.

## 2019-06-29 ENCOUNTER — Other Ambulatory Visit: Payer: Self-pay | Admitting: Medical Oncology

## 2019-06-29 DIAGNOSIS — C3491 Malignant neoplasm of unspecified part of right bronchus or lung: Secondary | ICD-10-CM

## 2019-06-29 MED ORDER — ERLOTINIB HCL 100 MG PO TABS: 100.0000 mg | ORAL_TABLET | Freq: Every day | ORAL | 5 refills | Status: DC

## 2019-07-01 ENCOUNTER — Other Ambulatory Visit: Payer: Self-pay | Admitting: Internal Medicine

## 2019-07-01 DIAGNOSIS — C3491 Malignant neoplasm of unspecified part of right bronchus or lung: Secondary | ICD-10-CM

## 2019-07-10 ENCOUNTER — Ambulatory Visit (INDEPENDENT_AMBULATORY_CARE_PROVIDER_SITE_OTHER): Payer: Medicare Other | Admitting: Internal Medicine

## 2019-07-10 ENCOUNTER — Encounter: Payer: Self-pay | Admitting: Internal Medicine

## 2019-07-10 ENCOUNTER — Other Ambulatory Visit: Payer: Self-pay

## 2019-07-10 DIAGNOSIS — K219 Gastro-esophageal reflux disease without esophagitis: Secondary | ICD-10-CM

## 2019-07-10 DIAGNOSIS — J449 Chronic obstructive pulmonary disease, unspecified: Secondary | ICD-10-CM

## 2019-07-10 DIAGNOSIS — R0609 Other forms of dyspnea: Secondary | ICD-10-CM

## 2019-07-10 MED ORDER — STIOLTO RESPIMAT 2.5-2.5 MCG/ACT IN AERS: 2.0000 | INHALATION_SPRAY | Freq: Every day | RESPIRATORY_TRACT | 11 refills | Status: DC

## 2019-07-10 MED ORDER — PREDNISONE 10 MG PO TABS: ORAL_TABLET | ORAL | 0 refills | Status: DC

## 2019-07-10 NOTE — Patient Instructions (Addendum)
Continue stiolto 2 pffs daily - other option Bevespi 2 every 12 hours  If getting worse >  Prednisone 10 mg take  4 each am x 2 days,   2 each am x 2 days,  1 each am x 2 days and stop    Pepcid 20 mg after bfast and supper    Please schedule a follow up visit in 3 months but call sooner if needed

## 2019-07-10 NOTE — Progress Notes (Signed)
Dana [name], female    DOB: [birth year],    MRN: [id number]    Brief patient profile:  56 yowf mother of primary care physician in [place] quit smoking 1979 with chronic doe prior  evals with nl spirometry as recently as 12/03/09   07/05/2016  Walked RA x 3 laps @ 185 ft each stopped due to  End of study, nl pace no desat or sob  - Spirometry 07/05/2016  wnl p am spiriva  - CTa chest 07/05/2016 neg pe/ neg ILD, R infrahilar density c/w lung ca vs post RT scar     Oncology note 11/17/18 : DIAGNOSIS: Stage IIIB (T2a, N3, M0) non-small cell lung cancer, adenocarcinoma with positive EGFR mutation diagnosed in January 2010.  PRIOR THERAPY: 1) Status post concurrent chemoradiation with weekly carboplatin and paclitaxel; last dose given March 21, 2009.  2) Tarceva at 150 mg p.o. daily, status post approximately 48 months of treatment, discontinued secondary to persistent diarrhea.  3) status post right Pleurx catheter placement for right nonmalignant pleural effusion. 4) R pleurex Gerhardt 2017 / sp pleurodesis  CURRENT THERAPY: Tarceva 100 mg by mouth daily started 03/19/2013, status post 68 months of treatment.     History of Present Illness  11/25/2018  Pulmonary consultation / Dr Blima Singer re sob   Chief Complaint  Patient presents with  . Pulmonary Consult    Referred by Theadore Nan. Pt c/o SOB x 10 yrs, worse x 3 months. She gets out of breath walking up a flight of stairs.   Dyspnea:  Indolent onset/ As of 3 months prior to [place] worse" to point where struggles step to step and stops at top of one flight  Still able to shop at [place] [name] / does not check sats  Cough: none Sleep: fine flat on one pillow SABA use: none  rec Goal is to keep your 02 saturation above 90% by walking slower or adjusting 02 flow to reach at least 90%      12/03/2018  f/u ov/Wert re: sob Chief Complaint  Patient presents with  . Follow-up    She states breathing seems  better but she has not been as active. She was started on o2 per PCP- she only uses when she is at home during the day, none with sleep.   Dyspnea:  Has not tried steps yet but seems easier room to room 2lpm and  Typically maintaining > 90% when she checks it  Cough: none Sleeping: fine  SABA use: none 02: 2lpm prn daytime not at hs  rec Start anoro one click each am  - take two good deep drags then rinse and gargle  Adjust 02 saturations to keep it above 90%         06/22/2019  f/u ov/Wert re: COPD GOLD 0/worse doe on anoro with assoc uacs/ no longer using 02 Chief Complaint  Patient presents with  . Follow-up    Breathing has been worse since the last visit. She states she feels exhausted and is afraid to go out anywhere alone. She has prod cough with clear sputum.   Dyspnea:  MMRC3 = can't walk 100 yards even at a slow pace at a flat grade s stopping due to sob   Cough: throat clearing esp after anoro Sleeping: ok flat fine SABA use: none 02: none  rec Stop anoro and start stiolto 2 puffs each am in its place Prednisone 10 mg take  4 each am  x 2 days,   2 each am x 2 days,  1 each am x 2 days and stop  Work on inhaler technique:  relax and gently blow all the way out then take a nice smooth deep breath back in, triggering the inhaler at same time you start breathing in.  Hold for up to 5 seconds if you can.  . Rinse and gargle with water when done Stop boniva after this month Protonix 40 mg Take 30-60 min before first meal of the day daily  Please schedule a follow up office visit in 2 weeks, sooner if needed    07/10/2019  f/u ov/Wert re: COPD 0 rx stiolto / rash on protonix resolved off  Chief Complaint  Patient presents with  . Follow-up    Breathing is "great".  No new co's.  Dyspnea:  Better but still = MMRC2 = can't walk a nl pace on a flat grade s sob but does fine slow and flat  Cough: much better  Sleeping: flat fine one pillow  SABA use: none  02: none  Overt hb  off ppi   No obvious day to day or daytime variability or assoc excess/ purulent sputum or mucus plugs or hemoptysis or cp or chest tightness, subjective wheeze or overt sinus   symptoms.   sleeping without nocturnal  or early am exacerbation  of respiratory  c/o's or need for noct saba. Also denies any obvious fluctuation of symptoms with weather or environmental changes or other aggravating or alleviating factors except as outlined above   No unusual exposure hx or h/o childhood pna/ asthma or knowledge of premature birth.  Current Allergies, Complete Past Medical History, Past Surgical History, Family History, and Social History were reviewed in [facility] record.  ROS  The following are not active complaints unless bolded Hoarseness, sore throat, dysphagia, dental problems, itching, sneezing,  nasal congestion or discharge of excess mucus or purulent secretions, ear ache,   fever, chills, sweats, unintended wt loss or wt gain, classically pleuritic or exertional cp,  orthopnea pnd or arm/hand swelling  or leg swelling, presyncope, palpitations, abdominal pain, anorexia, nausea, vomiting, diarrhea  or change in bowel habits or change in bladder habits, change in stools or change in urine, dysuria, hematuria,  rash, arthralgias, visual complaints, headache, numbness, weakness or ataxia or problems with walking or coordination,  change in mood or  memory.        Current Meds  Medication Sig  . aspirin EC 81 MG tablet Take 81 mg by mouth daily.  Marland Kitchen augmented betamethasone dipropionate (DIPROLENE-AF) 0.05 % ointment Apply 1 application topically 2 (two) times daily as needed.  . bismuth subsalicylate (PEPTO BISMOL) 262 MG/15ML suspension Take 30 mLs by mouth every three (3) days as needed.  . Calcium Carb-Cholecalciferol (CALCIUM-VITAMIN D) 500-200 MG-UNIT tablet Take by mouth.  . Calcium Carbonate-Vitamin D 600-400 MG-UNIT per chew tablet Chew 1 tablet by mouth 2 (two)  times daily.  . cholecalciferol (VITAMIN D) 1000 UNITS tablet Take 1,000 Units by mouth daily.   . clindamycin (CLEOCIN T) 1 % external solution APPLY  SOLUTION TOPICALLY TO AFFECTED AREA(S) TWICE DAILY  . Coenzyme Q10 (COQ10) 100 MG CAPS Take 1 capsule by mouth daily.   . Cyanocobalamin (B-12) 1000 MCG CAPS Take 1,000 mcg by mouth 3 (three) times daily with meals.   . diclofenac sodium (VOLTAREN) 1 % GEL Apply 2 g topically 4 (four) times daily.  Marland Kitchen erlotinib (TARCEVA) 100 MG tablet  Take 1 tablet (100 mg total) by mouth daily. Take on an empty stomach 1 hour before meals or 2 hours after  . loperamide (IMODIUM A-D) 2 MG tablet Take 1 mg by mouth 4 (four) times daily as needed for diarrhea or loose stools.   Marland Kitchen loratadine (CLARITIN) 10 MG tablet Take 10 mg by mouth daily as needed for allergies.   Marland Kitchen LORazepam (ATIVAN) 0.5 MG tablet Take 1 tablet (0.5 mg total) by mouth every 8 (eight) hours as needed for anxiety.  . meloxicam (MOBIC) 15 MG tablet Take 15 mg by mouth daily.  . mirtazapine (REMERON) 15 MG tablet TAKE 1 TABLET BY MOUTH AT BEDTIME  . ondansetron (ZOFRAN ODT) 8 MG disintegrating tablet Take 1 tablet (8 mg total) by mouth every 8 (eight) hours as needed for nausea or vomiting.  Marland Kitchen oxyCODONE-acetaminophen (PERCOCET) 5-325 MG tablet Take 1 tablet by mouth every 4 (four) hours as needed (for pain; may cause constipation).  Vladimir Faster Glycol-Propyl Glycol (SYSTANE OP) Place 1 drop into both eyes at bedtime as needed (for dry eyes.).   Marland Kitchen psyllium (METAMUCIL) 58.6 % powder Take 1 packet by mouth daily as needed.  . Simethicone (GAS-X PO) Take 1 tablet by mouth 2 (two) times daily as needed (for gas).   . Tiotropium Bromide-Olodaterol (STIOLTO RESPIMAT) 2.5-2.5 MCG/ACT AERS Inhale 2 puffs into the lungs daily.             Objective:     Thin amb wf nad   07/10/2019        115  06/22/2019        114  05/08/2019        115  12/03/2018      120  11/25/18 116 lb (52.6 kg)  11/17/18 117  lb 6.4 oz (53.3 kg)  09/16/18 117 lb 4.8 oz (53.2 kg)    Vital signs reviewed - Note on arrival 02 sats  99% on RA    . HEENT: nl dentition / oropharynx. Nl external ear canals without cough reflex -  Mild bilateral non-specific turbinate edema     NECK :  without JVD/Nodes/TM/ nl carotid upstrokes bilaterally   LUNGS: no acc muscle use,  Min barrel  contour chest wall with bilateral  slightly decreased bs s audible wheeze and  without cough on insp or exp maneuver and min  Hyperresonant  to  percussion bilaterally     CV:  RRR  no s3 or murmur or increase in P2, and no edema   ABD:  soft and nontender with pos end  insp Hoover's  in the supine position. No bruits or organomegaly appreciated, bowel sounds nl  MS:   Nl gait/  ext warm without deformities, calf tenderness, cyanosis or clubbing No obvious joint restrictions   SKIN: warm and dry without lesions    NEURO:  alert, approp, nl sensorium with  no motor or cerebellar deficits apparent.                    Assessment

## 2019-07-11 ENCOUNTER — Encounter: Payer: Self-pay | Admitting: Internal Medicine

## 2019-07-11 DIAGNOSIS — J449 Chronic obstructive pulmonary disease, unspecified: Secondary | ICD-10-CM

## 2019-07-11 DIAGNOSIS — K219 Gastro-esophageal reflux disease without esophagitis: Secondary | ICD-10-CM | POA: Insufficient documentation

## 2019-07-11 HISTORY — DX: Chronic obstructive pulmonary disease, unspecified: J44.9

## 2019-07-11 NOTE — Assessment & Plan Note (Addendum)
Intol to ppi = rash as of 06/2019    Try pepcid 20 mg bid and if not effective refer to GI   I had an extended discussion with the patient reviewing all relevant studies completed to date and  lasting 15 to 20 minutes of a 25 minute visit    I performed device teaching which extended face to face time for this visit.  Each maintenance medication was reviewed in detail including emphasizing most importantly the difference between maintenance and prns and under what circumstances the prns are to be triggered using an action plan format that is not reflected in the computer generated alphabetically organized AVS which I have not found useful in most complex patients, especially with respiratory illnesses  Please see AVS for specific instructions unique to this visit that I personally wrote and verbalized to the the pt in detail and then reviewed with pt  by my nurse highlighting any  changes in therapy recommended at today's visit to their plan of care.

## 2019-07-11 NOTE — Assessment & Plan Note (Signed)
Quit smoking 1979 07/05/2016  Walked RA x 3 laps @ 185 ft each stopped due to  End of study, nl pace no desat or sob  - Spirometry 07/05/2016  wnl p am spiriva  - CTa chest 07/05/2016 neg pe/ neg ILD, R infrahilar density c/w lung ca vs post RT scar   Chest CT w contrast  09/12/18  Stable CT of the chest - 11/25/2018   Walked RA  2 laps @ 241ft each @ moderate pace  stopped due to  desats to 86% corrected on 2lpm  - HRCT  11/28/18 1. No findings to suggest interstitial lung disease. 2. Chronic postradiation changes in the right lung and evidence of prior right-sided pleurodesis redemonstrated, as above. - PFT's  12/03/2018  FEV1 1.53 (86 % ) ratio 80  p 18 % improvement from saba p nothing prior to study with DLCO  52/52 % corrects to 80 % for alv volume  With minimal curvature   - 06/22/2019   Walked RA  2 laps @  approx 29ft each @ slow to moderate pace  stopped due to  End of study, min sob, no desats   - 06/22/2019   > try stiolto instead of anoro x 2 weeks > better  - 07/10/2019  After extensive coaching inhaler device,  effectiveness =    75% with SMI (ti too short)    Clearly she responds to rx for copd despite the absence of classic criteria and has improved to point does not need 02 walking anymore.  Pt is Group B in terms of symptom/risk and laba/lama therefore appropriate rx at this point >>>  Continue lama/laba either smi or hfa but dpi causes cough   >>> rx stiolto, if not covered then bevespi an option   Advised:  formulary restrictions will be an ongoing challenge for the forseable future and I would be happy to pick an alternative if the pt will first  provide me a list of them -  pt  will need to return here for training for any new device that is required eg dpi vs hfa vs respimat.    In the meantime we can always provide samples so that the patient never runs out of any needed respiratory medications.

## 2019-07-16 ENCOUNTER — Ambulatory Visit: Payer: Medicare Other | Admitting: Internal Medicine

## 2019-08-06 ENCOUNTER — Ambulatory Visit
Admission: RE | Admit: 2019-08-06 | Discharge: 2019-08-06 | Disposition: A | Discharge status: Home / Self Care | Payer: Medicare Other | Source: Ambulatory Visit | Attending: Family Medicine | Admitting: Family Medicine

## 2019-08-06 ENCOUNTER — Other Ambulatory Visit: Payer: Self-pay

## 2019-08-06 DIAGNOSIS — Z1231 Encounter for screening mammogram for malignant neoplasm of breast: Secondary | ICD-10-CM

## 2019-08-06 DIAGNOSIS — M810 Age-related osteoporosis without current pathological fracture: Secondary | ICD-10-CM

## 2019-08-17 ENCOUNTER — Inpatient Hospital Stay (HOSPITAL_BASED_OUTPATIENT_CLINIC_OR_DEPARTMENT_OTHER): Payer: Medicare Other | Admitting: Internal Medicine

## 2019-08-17 ENCOUNTER — Other Ambulatory Visit: Payer: Self-pay

## 2019-08-17 ENCOUNTER — Encounter: Payer: Self-pay | Admitting: Internal Medicine

## 2019-08-17 ENCOUNTER — Inpatient Hospital Stay: Payer: Medicare Other | Attending: Internal Medicine

## 2019-08-17 ENCOUNTER — Telehealth: Payer: Self-pay | Admitting: Internal Medicine

## 2019-08-17 VITALS — BP 143/89 | HR 93 | Temp 98.7°F | Resp 20 | Ht 64.0 in | Wt 110.9 lb

## 2019-08-17 DIAGNOSIS — C3490 Malignant neoplasm of unspecified part of unspecified bronchus or lung: Secondary | ICD-10-CM | POA: Diagnosis not present

## 2019-08-17 DIAGNOSIS — M810 Age-related osteoporosis without current pathological fracture: Secondary | ICD-10-CM | POA: Insufficient documentation

## 2019-08-17 DIAGNOSIS — J449 Chronic obstructive pulmonary disease, unspecified: Secondary | ICD-10-CM | POA: Diagnosis not present

## 2019-08-17 DIAGNOSIS — C3491 Malignant neoplasm of unspecified part of right bronchus or lung: Secondary | ICD-10-CM | POA: Diagnosis not present

## 2019-08-17 DIAGNOSIS — J90 Pleural effusion, not elsewhere classified: Secondary | ICD-10-CM | POA: Insufficient documentation

## 2019-08-17 DIAGNOSIS — I10 Essential (primary) hypertension: Secondary | ICD-10-CM | POA: Insufficient documentation

## 2019-08-17 DIAGNOSIS — Z5181 Encounter for therapeutic drug level monitoring: Secondary | ICD-10-CM

## 2019-08-17 DIAGNOSIS — M129 Arthropathy, unspecified: Secondary | ICD-10-CM | POA: Insufficient documentation

## 2019-08-17 DIAGNOSIS — Z7982 Long term (current) use of aspirin: Secondary | ICD-10-CM | POA: Insufficient documentation

## 2019-08-17 DIAGNOSIS — K219 Gastro-esophageal reflux disease without esophagitis: Secondary | ICD-10-CM | POA: Diagnosis not present

## 2019-08-17 DIAGNOSIS — C342 Malignant neoplasm of middle lobe, bronchus or lung: Secondary | ICD-10-CM | POA: Diagnosis present

## 2019-08-17 DIAGNOSIS — F329 Major depressive disorder, single episode, unspecified: Secondary | ICD-10-CM | POA: Insufficient documentation

## 2019-08-17 DIAGNOSIS — Z79899 Other long term (current) drug therapy: Secondary | ICD-10-CM | POA: Diagnosis not present

## 2019-08-17 LAB — CBC WITH DIFFERENTIAL (CANCER CENTER ONLY)
Abs Immature Granulocytes: 0.02 10*3/uL (ref 0.00–0.07)
Basophils Absolute: 0 10*3/uL (ref 0.0–0.1)
Basophils Relative: 0 %
Eosinophils Absolute: 0.3 10*3/uL (ref 0.0–0.5)
Eosinophils Relative: 6 %
HCT: 37.9 % (ref 36.0–46.0)
Hemoglobin: 12.5 g/dL (ref 12.0–15.0)
Immature Granulocytes: 0 %
Lymphocytes Relative: 15 %
Lymphs Abs: 0.9 10*3/uL (ref 0.7–4.0)
MCH: 31.3 pg (ref 26.0–34.0)
MCHC: 33 g/dL (ref 30.0–36.0)
MCV: 95 fL (ref 80.0–100.0)
Monocytes Absolute: 0.6 10*3/uL (ref 0.1–1.0)
Monocytes Relative: 11 %
Neutro Abs: 3.9 10*3/uL (ref 1.7–7.7)
Neutrophils Relative %: 68 %
Platelet Count: 294 10*3/uL (ref 150–400)
RBC: 3.99 MIL/uL (ref 3.87–5.11)
RDW: 13.3 % (ref 11.5–15.5)
WBC Count: 5.8 10*3/uL (ref 4.0–10.5)
nRBC: 0 % (ref 0.0–0.2)

## 2019-08-17 LAB — CMP (CANCER CENTER ONLY)
ALT: 18 U/L (ref 0–44)
AST: 28 U/L (ref 15–41)
Albumin: 3.4 g/dL — ABNORMAL LOW (ref 3.5–5.0)
Alkaline Phosphatase: 57 U/L (ref 38–126)
Anion gap: 8 (ref 5–15)
BUN: 23 mg/dL (ref 8–23)
CO2: 26 mmol/L (ref 22–32)
Calcium: 9.2 mg/dL (ref 8.9–10.3)
Chloride: 108 mmol/L (ref 98–111)
Creatinine: 1.02 mg/dL — ABNORMAL HIGH (ref 0.44–1.00)
GFR, Est AFR Am: 59 mL/min — ABNORMAL LOW (ref >60)
GFR, Estimated: 51 mL/min — ABNORMAL LOW (ref >60)
Glucose, Bld: 79 mg/dL (ref 70–99)
Potassium: 3.9 mmol/L (ref 3.5–5.1)
Sodium: 142 mmol/L (ref 135–145)
Total Bilirubin: 0.6 mg/dL (ref 0.3–1.2)
Total Protein: 6.3 g/dL — ABNORMAL LOW (ref 6.5–8.1)

## 2019-08-17 NOTE — Progress Notes (Signed)
[place] Telephone:([phone]   [phone]  OFFICE PROGRESS NOTE  [name], MD [place] [address]  DIAGNOSIS: Stage IIIB (T2a, N3, M0) non-small cell lung cancer, adenocarcinoma with positive EGFR mutation diagnosed in January 2010.  PRIOR THERAPY: 1) Status post concurrent chemoradiation with weekly carboplatin and paclitaxel; last dose given March 21, 2009.  2) Tarceva at 150 mg p.o. daily, status post approximately 48 months of treatment, discontinued secondary to persistent diarrhea.  3) status post right Pleurx catheter placement for right nonmalignant pleural effusion.  CURRENT THERAPY: Tarceva 100 mg by mouth daily started 03/19/2013, status post 77 months of treatment.  INTERVAL HISTORY: Dana [name] 84 y.o. female returns to the clinic today for follow-up visit.  The patient is feeling fine today with no concerning complaints except for persistent fatigue.  She was treated with a course of penicillin for cellulitis of the right wrist recently.  She denied having any chest pain, shortness of breath, cough or hemoptysis.  She denied having any fever or chills.  She has no nausea, vomiting, diarrhea or constipation.  She denied having any headache or visual changes.  She is here today for evaluation and repeat blood work.  MEDICAL HISTORY: Past Medical History:  Diagnosis Date   Arthritis    HANDS,  WRISTS   COPD (chronic obstructive pulmonary disease) ([place])    Depression 04/04/2017   Dyspnea on exertion    Encounter for therapeutic drug monitoring 10/01/2016   GERD (gastroesophageal reflux disease)    Hemorrhoid    History of anal fissures    History of lung cancer ONCOLOGIST--  DR [name]--  LAST CT ,  NO RECURRENCE OR METS   DX JAN 2010 --  STAGE IIIA  NON-SMALL CELL ADENOCARCINOMA (RIGHT MIDDLE LOBE)---  S/P  CHEMORADIATIO THERAPY  (COMPLETE 03-21-2009)   HTN (hypertension) 10/10/2017   Imbalance  06/04/2017   lung ca dx'd 2010   Normal cardiac stress test    2007  PER PT   Osteoporosis    Rash, skin    RIGHT FOREARM/ HAND   Right wrist pain    MASS   Wears glasses     ALLERGIES:  is allergic to amoxicillin and protonix [pantoprazole sodium].  MEDICATIONS:  Current Outpatient Medications  Medication Sig Dispense Refill   aspirin EC 81 MG tablet Take 81 mg by mouth daily.     augmented betamethasone dipropionate (DIPROLENE-AF) 0.05 % ointment Apply 1 application topically 2 (two) times daily as needed.     bismuth subsalicylate (PEPTO BISMOL) 262 MG/15ML suspension Take 30 mLs by mouth every three (3) days as needed.     Calcium Carb-Cholecalciferol (CALCIUM-VITAMIN D) 500-200 MG-UNIT tablet Take by mouth.     Calcium Carbonate-Vitamin D 600-400 MG-UNIT per chew tablet Chew 1 tablet by mouth 2 (two) times daily.     cholecalciferol (VITAMIN D) 1000 UNITS tablet Take 1,000 Units by mouth daily.      clindamycin (CLEOCIN T) 1 % external solution APPLY  SOLUTION TOPICALLY TO AFFECTED AREA(S) TWICE DAILY 60 mL 0   Coenzyme Q10 (COQ10) 100 MG CAPS Take 1 capsule by mouth daily.      Cyanocobalamin (B-12) 1000 MCG CAPS Take 1,000 mcg by mouth 3 (three) times daily with meals.      diclofenac sodium (VOLTAREN) 1 % GEL Apply 2 g topically 4 (four) times daily.     erlotinib (TARCEVA) 100 MG tablet Take 1 tablet (  100 mg total) by mouth daily. Take on an empty stomach 1 hour before meals or 2 hours after 30 tablet 5   loperamide (IMODIUM A-D) 2 MG tablet Take 1 mg by mouth 4 (four) times daily as needed for diarrhea or loose stools.      loratadine (CLARITIN) 10 MG tablet Take 10 mg by mouth daily as needed for allergies.      LORazepam (ATIVAN) 0.5 MG tablet Take 1 tablet (0.5 mg total) by mouth every 8 (eight) hours as needed for anxiety. 30 tablet 0   meloxicam (MOBIC) 15 MG tablet Take 15 mg by mouth daily.  4   mirtazapine (REMERON) 15 MG tablet TAKE 1 TABLET BY  MOUTH AT BEDTIME 30 tablet 0   ondansetron (ZOFRAN ODT) 8 MG disintegrating tablet Take 1 tablet (8 mg total) by mouth every 8 (eight) hours as needed for nausea or vomiting. 10 tablet 1   oxyCODONE-acetaminophen (PERCOCET) 5-325 MG tablet Take 1 tablet by mouth every 4 (four) hours as needed (for pain; may cause constipation). 30 tablet 0   Polyethyl Glycol-Propyl Glycol (SYSTANE OP) Place 1 drop into both eyes at bedtime as needed (for dry eyes.).      predniSONE (DELTASONE) 10 MG tablet Take  4 each am x 2 days,   2 each am x 2 days,  1 each am x 2 days and stop 14 tablet 0   psyllium (METAMUCIL) 58.6 % powder Take 1 packet by mouth daily as needed.     Simethicone (GAS-X PO) Take 1 tablet by mouth 2 (two) times daily as needed (for gas).      Tiotropium Bromide-Olodaterol (STIOLTO RESPIMAT) 2.5-2.5 MCG/ACT AERS Inhale 2 puffs into the lungs daily. 4 g 11   No current facility-administered medications for this visit.     SURGICAL HISTORY:  Past Surgical History:  Procedure Laterality Date   ANAL FISSURE REPAIR  07/09/2011   INTERNAL SPHINCTEROTOMY   BENIGN EXCISION LEFT BREAST CENTRAL DUCT  02/1999   BREAST BIOPSY  01/31/2009   benign   BREAST EXCISIONAL BIOPSY Left 2004   benign   CHEST TUBE INSERTION Right 07/14/2014   Procedure: INSERTION PLEURAL DRAINAGE CATHETER RIGHT CHEST;  Surgeon: Grace Isaac, MD;  Location: [facility];  Service: Thoracic;  Laterality: Right;   EXCISION RIGHT WRIST MASS  2012   EXTRACORPOREAL SHOCK WAVE LITHOTRIPSY Left 06/01/2019   Procedure: EXTRACORPOREAL SHOCK WAVE LITHOTRIPSY (ESWL);  Surgeon: Cleon Gustin, MD;  Location: [facility];  Service: Urology;  Laterality: Left;   KNEE ARTHROSCOPY Right 1999   MASS EXCISION Right 03/11/2014   Procedure: RIGHT WRIST DEEP MASS EXCISION WITH CULTURE AND BIOSPY;  Surgeon: Linna Hoff, MD;  Location: [facility];  Service: Orthopedics;  Laterality: Right;   REMOVAL OF PLEURAL  DRAINAGE CATHETER Right 10/14/2014   Procedure: REMOVAL OF PLEURAL DRAINAGE CATHETER;  Surgeon: Grace Isaac, MD;  Location: [facility];  Service: Thoracic;  Laterality: Right;   TALC PLEURODESIS Right 09/16/2014   Procedure: TALC PLEURADESIS/ slurry;  Surgeon: Grace Isaac, MD;  Location: [facility];  Service: Thoracic;  Laterality: Right;   THORACOSCOPY  01-26-2009   w/   lung/  node biopsy's   THUMB ARTHROSCOPY Left    - removed bone spur   TONSILLECTOMY  AS CHILD   TOTAL ABDOMINAL HYSTERECTOMY W/ BILATERAL SALPINGOOPHORECTOMY  1982   W/  APPENDECTOMY   TRANSTHORACIC ECHOCARDIOGRAM  12-23-2008   NORMAL LV/  EF 65-70%/  MILD  MR  &  TR   VAULT SUSPENSION PLUS CYSTOCELE REPAIR WITH GRAFT  06-13-2010    REVIEW OF SYSTEMS:  A comprehensive review of systems was negative except for: Constitutional: positive for fatigue   PHYSICAL EXAMINATION: General appearance: alert, cooperative, fatigued and no distress Head: Normocephalic, without obvious abnormality, atraumatic Neck: no adenopathy, no JVD, supple, symmetrical, trachea midline and thyroid not enlarged, symmetric, no tenderness/mass/nodules Lymph nodes: Cervical, supraclavicular, and axillary nodes normal. Resp: clear to auscultation bilaterally Back: symmetric, no curvature. ROM normal. No CVA tenderness. Cardio: regular rate and rhythm, S1, S2 normal, no murmur, click, rub or gallop GI: soft, non-tender; bowel sounds normal; no masses,  no organomegaly Extremities: extremities normal, atraumatic, no cyanosis or edema  ECOG PERFORMANCE STATUS: 1 - Symptomatic but completely ambulatory  Blood pressure (!) 143/89, pulse 93, temperature 98.7 F (37.1 C), temperature source Oral, resp. rate 20, height '5\' 4"'$  (1.626 m), weight 110 lb 14.4 oz (50.3 kg), SpO2 98 %.  LABORATORY DATA: Lab Results  Component Value Date   WBC 4.7 06/12/2019   HGB 11.5 (L) 06/12/2019   HCT 35.5 (L) 06/12/2019   MCV 97.8 06/12/2019   PLT 300  06/12/2019      Chemistry      Component Value Date/Time   NA 140 06/12/2019 0903   NA 142 12/10/2017 1000   K 3.8 06/12/2019 0903   K 4.0 12/10/2017 1000   CL 104 06/12/2019 0903   CL 108 (H) 06/03/2013 0939   CO2 28 06/12/2019 0903   CO2 28 12/10/2017 1000   BUN 17 06/12/2019 0903   BUN 20.4 12/10/2017 1000   CREATININE 0.86 06/12/2019 0903   CREATININE 0.9 12/10/2017 1000      Component Value Date/Time   CALCIUM 8.9 06/12/2019 0903   CALCIUM 9.7 12/10/2017 1000   ALKPHOS 49 06/12/2019 0903   ALKPHOS 63 12/10/2017 1000   AST 23 06/12/2019 0903   AST 25 12/10/2017 1000   ALT 19 06/12/2019 0903   ALT 17 12/10/2017 1000   BILITOT 0.9 06/12/2019 0903   BILITOT 1.05 12/10/2017 1000       RADIOGRAPHIC STUDIES: Dg Bone Density (dxa)  Result Date: 08/06/2019 EXAM: DUAL X-RAY ABSORPTIOMETRY (DXA) FOR BONE MINERAL DENSITY IMPRESSION: Referring Physician:  [name] W Your patient completed a BMD test using Lunar IDXA DXA system ( analysis version: 16 ) manufactured by [employer]. Technologist: CG PATIENT: Name: SMT., Dana [name] Patient ID: [id number] Birth Date: [birth year] Height: 62.0 in. Sex: Female Measured: 08/06/2019 Weight: 112.2 lbs. Indications: Advanced Age, Bilateral Ovariectomy (65.51), Caucasian, Estrogen Deficient, Hysterectomy, Postmenopausal Fractures: Ankle Treatments: Boniva, Calcium (E943.0), Vitamin D (E933.5) ASSESSMENT: The BMD measured at Femur Neck Right is 0.594 g/cm2 with a T-score of -3.2. This patient's diagnostic category is OSTEOPOROSIS according to [facility]) criteria. There has been a SIGNIFICANT DECREASE in BMD of right hip and left forearm since prior exam dated 05/17/2017. There has been NO SIGNIFICANT CHANGE in BMD of total mean since prior exam dated 05/17/2017. The scan quality is good. Lumbar spine was not utilized due to being excluded on prior exam. Site Region Measured Date Measured Age YA BMD Significant CHANGE T-score  DualFemur Neck Right 08[age]06/2019 82.5 -3.2 0.594 g/cm2 * DualFemur Neck Right 05/17/2017 80.3 -2.7 0.664 g/cm2 DualFemur Total Mean 08/06/2019 82.5 -2.2 0.734 g/cm2 DualFemur Total Mean 05/17/2017 80.3 -2.1 0.741 g/cm2 Left Forearm Radius 33% 08/06/2019 82.5 -3.1 0.616 g/cm2 * Left Forearm Radius 33% 05/17/2017 80.3 -2.2 0.695 g/cm2  [employer]) criteria for post-menopausal, Caucasian Women: Normal       T-score at or above -1 SD Osteopenia   T-score between -1 and -2.5 SD Osteoporosis T-score at or below -2.5 SD RECOMMENDATION: 1. All patients should optimize calcium and vitamin D intake. 2. Consider FDA approved medical therapies in postmenopausal women and men aged 58 years and older, based on the following: a. A hip or vertebral (clinical or morphometric) fracture b. T- score < or = -2.5 at the femoral neck or spine after appropriate evaluation to exclude secondary causes c. Low bone mass (T-score between -1.0 and -2.5 at the femoral neck or spine) and a 10 year probability of a hip fracture > or = 3% or a 10 year probability of a major osteoporosis-related fracture > or = 20% based on the [place]-adapted WHO algorithm d. Clinician judgment and/or patient preferences may indicate treatment for people with 10-year fracture probabilities above or below these levels FOLLOW-UP: Patients with diagnosis of osteoporosis or at high risk for fracture should have regular bone mineral density tests. For patients eligible for Medicare, routine testing is allowed once every 2 years. The testing frequency can be increased to one year for patients who have rapidly progressing disease, those who are receiving or discontinuing medical therapy to restore bone mass, or have additional risk factors. [facility] Radiology Electronically Signed   By: Margarette Canada M.D.   On: 08/06/2019 13:40   Mm 3d Screen Breast Bilateral  Result Date: 08/07/2019 CLINICAL DATA:  Screening. EXAM: DIGITAL SCREENING BILATERAL MAMMOGRAM  WITH TOMO AND CAD COMPARISON:  Previous exam(s). ACR Breast Density Category b: There are scattered areas of fibroglandular density. FINDINGS: There are no findings suspicious for malignancy. Images were processed with CAD. IMPRESSION: No mammographic evidence of malignancy. A result letter of this screening mammogram will be mailed directly to the patient. RECOMMENDATION: Screening mammogram in one year. (Code:SM-B-01Y) BI-RADS CATEGORY  1: Negative. Electronically Signed   By: Lillia Mountain M.D.   On: 08/07/2019 09:25    ASSESSMENT AND PLAN:  This is a very pleasant 84 years old white female with stage IIIB non-small cell lung cancer, adenocarcinoma with positive EGFR mutation diagnosed in January 2010. She has been on treatment with Tarceva for more than 9 years.   She is currently on Tarceva 100 mg p.o. daily status post 77 months. She has been tolerating her treatment with Tarceva fairly well except for occasional diarrhea.  The patient has no other complaints today. I recommended for her to continue her current treatment with Tarceva with the same dose. I will see her back for follow-up visit in 2 months for evaluation with repeat CT scan of the chest for restaging of her disease. She was advised to call immediately if she has any concerning symptoms in the interval. The patient voices understanding of current disease status and treatment options and is in agreement with the current care plan. All questions were answered. The patient knows to call the clinic with any problems, questions or concerns. We can certainly see the patient much sooner if necessary. Disclaimer: This note was dictated with voice recognition software. Similar sounding words can inadvertently be transcribed and may not be corrected upon review.

## 2019-08-17 NOTE — Telephone Encounter (Signed)
Scheduled appt per 8/24 los. ° °Printed calendar and avs. °

## 2019-08-18 ENCOUNTER — Other Ambulatory Visit: Payer: Self-pay | Admitting: Internal Medicine

## 2019-08-18 DIAGNOSIS — C3491 Malignant neoplasm of unspecified part of right bronchus or lung: Secondary | ICD-10-CM

## 2019-09-28 ENCOUNTER — Telehealth: Payer: Self-pay | Admitting: Internal Medicine

## 2019-09-28 NOTE — Telephone Encounter (Signed)
Returned patient's phone call regarding rescheduling an 10/13 lab appointment time, per patient's request appointment time has been rescheduled to fit with CT scan.

## 2019-10-07 ENCOUNTER — Other Ambulatory Visit: Payer: Self-pay | Admitting: Internal Medicine

## 2019-10-07 DIAGNOSIS — C3491 Malignant neoplasm of unspecified part of right bronchus or lung: Secondary | ICD-10-CM

## 2019-10-09 ENCOUNTER — Encounter: Payer: Self-pay | Admitting: Internal Medicine

## 2019-10-09 ENCOUNTER — Ambulatory Visit: Payer: Medicare Other | Admitting: Internal Medicine

## 2019-10-09 ENCOUNTER — Other Ambulatory Visit: Payer: Self-pay

## 2019-10-09 DIAGNOSIS — K219 Gastro-esophageal reflux disease without esophagitis: Secondary | ICD-10-CM

## 2019-10-09 DIAGNOSIS — J449 Chronic obstructive pulmonary disease, unspecified: Secondary | ICD-10-CM | POA: Diagnosis not present

## 2019-10-09 MED ORDER — BREZTRI AEROSPHERE 160-9-4.8 MCG/ACT IN AERO: 2.0000 | INHALATION_SPRAY | Freq: Two times a day (BID) | RESPIRATORY_TRACT | 0 refills | Status: DC

## 2019-10-09 MED ORDER — STIOLTO RESPIMAT 2.5-2.5 MCG/ACT IN AERS: 2.0000 | INHALATION_SPRAY | Freq: Every day | RESPIRATORY_TRACT | 0 refills | Status: DC

## 2019-10-09 NOTE — Progress Notes (Signed)
Dana [name], female    DOB: May 13, 1935    MRN: [id number]    Brief patient profile:  37 yowf mother of primary care physician in [place] quit smoking 1979 with chronic doe prior  evals with nl spirometry as recently as 12/03/09   07/05/2016  Walked RA x 3 laps @ 185 ft each stopped due to  End of study, nl pace no desat or sob  - Spirometry 07/05/2016  wnl p am spiriva  - CTa chest 07/05/2016 neg pe/ neg ILD, R infrahilar density c/w lung ca vs post RT scar     Oncology note 11/17/18 : DIAGNOSIS: Stage IIIB (T2a, N3, M0) non-small cell lung cancer, adenocarcinoma with positive EGFR mutation diagnosed in January 2010.  PRIOR THERAPY: 1) Status post concurrent chemoradiation with weekly carboplatin and paclitaxel; last dose given March 21, 2009.  2) Tarceva at 150 mg p.o. daily, status post approximately 48 months of treatment, discontinued secondary to persistent diarrhea.  3) status post right Pleurx catheter placement for right nonmalignant pleural effusion. 4) R pleurex Gerhardt 2017 / sp pleurodesis  CURRENT THERAPY: Tarceva 100 mg by mouth daily started 03/19/2013, status post 68 months of treatment.     History of Present Illness  11/25/2018  Pulmonary consultation / Dr Blima Singer re sob   Chief Complaint  Patient presents with  . Pulmonary Consult    Referred by Theadore Nan. Pt c/o SOB x 10 yrs, worse x 3 months. She gets out of breath walking up a flight of stairs.   Dyspnea:  Indolent onset/ As of 3 months prior to [place] worse" to point where struggles step to step and stops at top of one flight  Still able to shop at [place] / does not check sats  Cough: none Sleep: fine flat on one pillow SABA use: none  rec Goal is to keep your 02 saturation above 90% by walking slower or adjusting 02 flow to reach at least 90%      12/03/2018  f/u ov/Kalynn Declercq re: sob Chief Complaint  Patient presents with  . Follow-up    She states breathing seems  better but she has not been as active. She was started on o2 per PCP- she only uses when she is at home during the day, none with sleep.   Dyspnea:  Has not tried steps yet but seems easier room to room 2lpm and  Typically maintaining > 90% when she checks it  Cough: none Sleeping: fine  SABA use: none 02: 2lpm prn daytime not at hs  rec Start anoro one click each am  - take two good deep drags then rinse and gargle  Adjust 02 saturations to keep it above 90%         06/22/2019  f/u ov/Wandalene Abrams re: COPD GOLD 0/worse doe on anoro with assoc uacs/ no longer using 02 Chief Complaint  Patient presents with  . Follow-up    Breathing has been worse since the last visit. She states she feels exhausted and is afraid to go out anywhere alone. She has prod cough with clear sputum.   Dyspnea:  MMRC3 = can't walk 100 yards even at a slow pace at a flat grade s stopping due to sob   Cough: throat clearing esp after anoro Sleeping: ok flat fine SABA use: none 02: none  rec Stop anoro and start stiolto 2 puffs each am in its place Prednisone 10 mg take  4 each am  x 2 days,   2 each am x 2 days,  1 each am x 2 days and stop  Work on inhaler technique:  relax and gently blow all the way out then take a nice smooth deep breath back in, triggering the inhaler at same time you start breathing in.  Hold for up to 5 seconds if you can.  . Rinse and gargle with water when done Stop boniva after this month Protonix 40 mg Take 30-60 min before first meal of the day daily  Please schedule a follow up office visit in 2 weeks, sooner if needed    07/10/2019  f/u ov/Ronnika Collett re: COPD 0 rx stiolto / rash on protonix resolved off  Chief Complaint  Patient presents with  . Follow-up    Breathing is "great".  No new co's.  Dyspnea:  Better but still = MMRC2 = can't walk a nl pace on a flat grade s sob but does fine slow and flat  Cough: much better  Sleeping: flat fine one pillow  SABA use: none  02: none  Overt hb  off ppi  rec Continue stiolto 2 pffs daily - other option Bevespi 2 every 12 hours If getting worse >  Prednisone 10 mg take  4 each am x 2 days,   2 each am x 2 days,  1 each am x 2 days and stop   Pepcid 20 mg after bfast and supper    10/09/2019  f/u ov/Augustina Braddock re: GOLD 0  stiolto Chief Complaint  Patient presents with  . Follow-up    Breathing is unchanged since the last visit.    Dyspnea:  Up and down ailes at [employer] for up to an hour Cough: no Sleeping: able to lie flat one pillow  SABA use: none 02: none    No obvious day to day or daytime variability or assoc excess/ purulent sputum or mucus plugs or hemoptysis or cp or chest tightness, subjective wheeze or overt sinus or hb symptoms.   Sleeping  without nocturnal  or early am exacerbation  of respiratory  c/o's or need for noct saba. Also denies any obvious fluctuation of symptoms with weather or environmental changes or other aggravating or alleviating factors except as outlined above   No unusual exposure hx or h/o childhood pna/ asthma or knowledge of premature birth.  Current Allergies, Complete Past Medical History, Past Surgical History, Family History, and Social History were reviewed in [employer] record.  ROS  The following are not active complaints unless bolded Hoarseness, sore throat, dysphagia, dental problems, itching, sneezing,  nasal congestion or discharge of excess mucus or purulent secretions, ear ache,   fever, chills, sweats, unintended wt loss or wt gain, classically pleuritic or exertional cp,  orthopnea pnd or arm/hand swelling  or leg swelling, presyncope, palpitations, abdominal pain, anorexia, nausea, vomiting, diarrhea  or change in bowel habits or change in bladder habits, change in stools or change in urine, dysuria, hematuria,  rash, arthralgias, visual complaints, headache, numbness, weakness or ataxia or problems with walking or coordination,  change in mood or  memory.         Current Meds  Medication Sig  . aspirin EC 81 MG tablet Take 81 mg by mouth daily.  Marland Kitchen augmented betamethasone dipropionate (DIPROLENE-AF) 0.05 % ointment Apply 1 application topically 2 (two) times daily as needed.  . bismuth subsalicylate (PEPTO BISMOL) 262 MG/15ML suspension Take 30 mLs by mouth every three (3) days as needed.  Marland Kitchen  Calcium Carb-Cholecalciferol (CALCIUM-VITAMIN D) 500-200 MG-UNIT tablet Take by mouth.  . Calcium Carbonate-Vitamin D 600-400 MG-UNIT per chew tablet Chew 1 tablet by mouth 2 (two) times daily.  . cholecalciferol (VITAMIN D) 1000 UNITS tablet Take 1,000 Units by mouth daily.   . clindamycin (CLEOCIN T) 1 % external solution APPLY  SOLUTION TOPICALLY TO AFFECTED AREA(S) TWICE DAILY  . Coenzyme Q10 (COQ10) 100 MG CAPS Take 1 capsule by mouth daily.   . Cyanocobalamin (B-12) 1000 MCG CAPS Take 1,000 mcg by mouth 3 (three) times daily with meals.   . erlotinib (TARCEVA) 100 MG tablet Take 1 tablet (100 mg total) by mouth daily. Take on an empty stomach 1 hour before meals or 2 hours after  . loperamide (IMODIUM A-D) 2 MG tablet Take 1 mg by mouth 4 (four) times daily as needed for diarrhea or loose stools.   Marland Kitchen loratadine (CLARITIN) 10 MG tablet Take 10 mg by mouth daily as needed for allergies.   Marland Kitchen LORazepam (ATIVAN) 0.5 MG tablet Take 1 tablet (0.5 mg total) by mouth every 8 (eight) hours as needed for anxiety.  . meloxicam (MOBIC) 15 MG tablet Take 15 mg by mouth daily.  . mirtazapine (REMERON) 15 MG tablet TAKE 1 TABLET BY MOUTH AT BEDTIME  . Polyethyl Glycol-Propyl Glycol (SYSTANE OP) Place 1 drop into both eyes at bedtime as needed (for dry eyes.).   Marland Kitchen psyllium (METAMUCIL) 58.6 % powder Take 1 packet by mouth daily as needed.  . Simethicone (GAS-X PO) Take 1 tablet by mouth 2 (two) times daily as needed (for gas).   . Tiotropium Bromide-Olodaterol (STIOLTO RESPIMAT) 2.5-2.5 MCG/ACT AERS Inhale 2 puffs into the lungs daily.            Objective:     Thin  slt hoarse wf nad  10/09/2019      111  07/10/2019        115  06/22/2019        114  05/08/2019        115  12/03/2018      120  11/25/18 116 lb (52.6 kg)  11/17/18 117 lb 6.4 oz (53.3 kg)  09/16/18 117 lb 4.8 oz (53.2 kg)    Vital signs reviewed - Note on arrival 02 sats  98% on RA   HEENT : pt wearing mask not removed for exam due to covid - 19 concerns.   NECK :  without JVD/Nodes/TM/ nl carotid upstrokes bilaterally   LUNGS: no acc muscle use,  Min barrel  contour chest wall with bilateral  slightly decreased bs s audible wheeze and  without cough on insp or exp maneuvers and min  Hyperresonant  to  percussion bilaterally     CV:  RRR  no s3 or murmur or increase in P2, and no edema   ABD:  soft and nontender with pos end  insp Hoover's  in the supine position. No bruits or organomegaly appreciated, bowel sounds nl  MS:   Nl gait/  ext warm without deformities, calf tenderness, cyanosis or clubbing No obvious joint restrictions   SKIN: warm and dry without lesions    NEURO:  alert, approp, nl sensorium with  no motor or cerebellar deficits apparent.             Assessment

## 2019-10-09 NOTE — Patient Instructions (Addendum)
Continue pepcid (famotidine) 20 mg after bfast and supper  Try just one puff stiolto each am top see if you can tell any difference with walking at [employer]   Please schedule a follow up visit in 6 months but call sooner if needed

## 2019-10-11 ENCOUNTER — Encounter: Payer: Self-pay | Admitting: Internal Medicine

## 2019-10-11 NOTE — Assessment & Plan Note (Signed)
Intol to ppi = rash as of 06/2019  Adequate control on present rx, reviewed in detail with pt > no change in rx needed  = h2 bid

## 2019-10-11 NOTE — Assessment & Plan Note (Signed)
Quit smoking 1979 07/05/2016  Walked RA x 3 laps @ 185 ft each stopped due to  End of study, nl pace no desat or sob  - Spirometry 07/05/2016  wnl p am spiriva  - CTa chest 07/05/2016 neg pe/ neg ILD, R infrahilar density c/w lung ca vs post RT scar   Chest CT w contrast  09/12/18  Stable CT of the chest - 11/25/2018   Walked RA  2 laps @ 274ft each @ moderate pace  stopped due to  desats to 86% corrected on 2lpm  - HRCT  11/28/18 1. No findings to suggest interstitial lung disease. 2. Chronic postradiation changes in the right lung and evidence of prior right-sided pleurodesis redemonstrated, as above. - PFT's  12/03/2018  FEV1 1.53 (86 % ) ratio 80  p 18 % improvement from saba p nothing prior to study with DLCO  52/52 % corrects to 80 % for alv volume  With minimal curvature  - 06/22/2019   Walked RA  2 laps @  approx 281ft each @ slow to moderate pace  stopped due to  End of study, min sob, no desats   - 06/22/2019   > try stiolto instead of anoro x 2 weeks > better  - 10/09/2019  After extensive coaching inhaler device,  effectiveness =    90% with SMI > try stiolto just one puff each am  Since pfts and HRCT do not suggest copd/emphysema and her baseline smi is only about 50% effective, should be able to wean stiolto to just one puff each am and see if does just as well with activity.  I had an extended discussion with the patient reviewing all relevant studies completed to date and  lasting 15 to 20 minutes of a 25 minute visit    I performed detailed device teaching using a teach back method which extended face to face time for this visit (see above)  Each maintenance medication was reviewed in detail including emphasizing most importantly the difference between maintenance and prns and under what circumstances the prns are to be triggered using an action plan format that is not reflected in the computer generated alphabetically organized AVS which I have not found useful in most complex  patients, especially with respiratory illnesses  Please see AVS for specific instructions unique to this visit that I personally wrote and verbalized to the the pt in detail and then reviewed with pt  by my nurse highlighting any  changes in therapy recommended at today's visit to their plan of care.

## 2019-10-16 ENCOUNTER — Ambulatory Visit (HOSPITAL_COMMUNITY)
Admission: RE | Admit: 2019-10-16 | Discharge: 2019-10-16 | Disposition: A | Discharge status: Home / Self Care | Payer: Medicare Other | Source: Ambulatory Visit | Attending: Internal Medicine | Admitting: Internal Medicine

## 2019-10-16 ENCOUNTER — Inpatient Hospital Stay: Payer: Medicare Other | Attending: Internal Medicine

## 2019-10-16 ENCOUNTER — Other Ambulatory Visit: Payer: Self-pay

## 2019-10-16 ENCOUNTER — Other Ambulatory Visit: Payer: Medicare Other

## 2019-10-16 DIAGNOSIS — Z791 Long term (current) use of non-steroidal anti-inflammatories (NSAID): Secondary | ICD-10-CM | POA: Diagnosis not present

## 2019-10-16 DIAGNOSIS — C342 Malignant neoplasm of middle lobe, bronchus or lung: Secondary | ICD-10-CM | POA: Insufficient documentation

## 2019-10-16 DIAGNOSIS — R5383 Other fatigue: Secondary | ICD-10-CM | POA: Diagnosis not present

## 2019-10-16 DIAGNOSIS — M1990 Unspecified osteoarthritis, unspecified site: Secondary | ICD-10-CM | POA: Insufficient documentation

## 2019-10-16 DIAGNOSIS — I10 Essential (primary) hypertension: Secondary | ICD-10-CM | POA: Insufficient documentation

## 2019-10-16 DIAGNOSIS — Z7982 Long term (current) use of aspirin: Secondary | ICD-10-CM | POA: Diagnosis not present

## 2019-10-16 DIAGNOSIS — J449 Chronic obstructive pulmonary disease, unspecified: Secondary | ICD-10-CM | POA: Diagnosis not present

## 2019-10-16 DIAGNOSIS — M810 Age-related osteoporosis without current pathological fracture: Secondary | ICD-10-CM | POA: Diagnosis not present

## 2019-10-16 DIAGNOSIS — C3490 Malignant neoplasm of unspecified part of unspecified bronchus or lung: Secondary | ICD-10-CM | POA: Diagnosis not present

## 2019-10-16 DIAGNOSIS — Z79899 Other long term (current) drug therapy: Secondary | ICD-10-CM | POA: Insufficient documentation

## 2019-10-16 DIAGNOSIS — K219 Gastro-esophageal reflux disease without esophagitis: Secondary | ICD-10-CM | POA: Diagnosis not present

## 2019-10-16 DIAGNOSIS — F329 Major depressive disorder, single episode, unspecified: Secondary | ICD-10-CM | POA: Diagnosis not present

## 2019-10-16 LAB — CBC WITH DIFFERENTIAL (CANCER CENTER ONLY)
Abs Immature Granulocytes: 0.02 10*3/uL (ref 0.00–0.07)
Basophils Absolute: 0 10*3/uL (ref 0.0–0.1)
Basophils Relative: 0 %
Eosinophils Absolute: 0.3 10*3/uL (ref 0.0–0.5)
Eosinophils Relative: 4 %
HCT: 36.8 % (ref 36.0–46.0)
Hemoglobin: 12.4 g/dL (ref 12.0–15.0)
Immature Granulocytes: 0 %
Lymphocytes Relative: 12 %
Lymphs Abs: 0.9 10*3/uL (ref 0.7–4.0)
MCH: 31.3 pg (ref 26.0–34.0)
MCHC: 33.7 g/dL (ref 30.0–36.0)
MCV: 92.9 fL (ref 80.0–100.0)
Monocytes Absolute: 0.6 10*3/uL (ref 0.1–1.0)
Monocytes Relative: 9 %
Neutro Abs: 5.4 10*3/uL (ref 1.7–7.7)
Neutrophils Relative %: 75 %
Platelet Count: 285 10*3/uL (ref 150–400)
RBC: 3.96 MIL/uL (ref 3.87–5.11)
RDW: 13.9 % (ref 11.5–15.5)
WBC Count: 7.2 10*3/uL (ref 4.0–10.5)
nRBC: 0 % (ref 0.0–0.2)

## 2019-10-16 LAB — CMP (CANCER CENTER ONLY)
ALT: 18 U/L (ref 0–44)
AST: 25 U/L (ref 15–41)
Albumin: 3.3 g/dL — ABNORMAL LOW (ref 3.5–5.0)
Alkaline Phosphatase: 57 U/L (ref 38–126)
Anion gap: 10 (ref 5–15)
BUN: 26 mg/dL — ABNORMAL HIGH (ref 8–23)
CO2: 24 mmol/L (ref 22–32)
Calcium: 8.9 mg/dL (ref 8.9–10.3)
Chloride: 108 mmol/L (ref 98–111)
Creatinine: 1.1 mg/dL — ABNORMAL HIGH (ref 0.44–1.00)
GFR, Est AFR Am: 54 mL/min — ABNORMAL LOW (ref >60)
GFR, Estimated: 47 mL/min — ABNORMAL LOW (ref >60)
Glucose, Bld: 83 mg/dL (ref 70–99)
Potassium: 4 mmol/L (ref 3.5–5.1)
Sodium: 142 mmol/L (ref 135–145)
Total Bilirubin: 0.8 mg/dL (ref 0.3–1.2)
Total Protein: 6.1 g/dL — ABNORMAL LOW (ref 6.5–8.1)

## 2019-10-16 MED ORDER — IOHEXOL 300 MG/ML  SOLN
75.0000 mL | Freq: Once | INTRAMUSCULAR | Status: AC | PRN
  Administered 2019-10-16: 75 mL via INTRAVENOUS

## 2019-10-16 MED ORDER — SODIUM CHLORIDE (PF) 0.9 % IJ SOLN
INTRAMUSCULAR | Status: AC
  Filled 2019-10-16: qty 50

## 2019-10-19 ENCOUNTER — Encounter: Payer: Self-pay | Admitting: Internal Medicine

## 2019-10-19 ENCOUNTER — Other Ambulatory Visit: Payer: Self-pay

## 2019-10-19 ENCOUNTER — Inpatient Hospital Stay: Payer: Medicare Other | Admitting: Internal Medicine

## 2019-10-19 VITALS — BP 148/96 | HR 97 | Temp 98.5°F | Resp 17 | Ht 64.0 in | Wt 114.3 lb

## 2019-10-19 DIAGNOSIS — C342 Malignant neoplasm of middle lobe, bronchus or lung: Secondary | ICD-10-CM | POA: Diagnosis not present

## 2019-10-19 DIAGNOSIS — C3491 Malignant neoplasm of unspecified part of right bronchus or lung: Secondary | ICD-10-CM | POA: Diagnosis not present

## 2019-10-19 NOTE — Progress Notes (Signed)
[facility] Telephone:([phone]) [phone]   [phone]  OFFICE PROGRESS NOTE  [name], MD [place] [address]  DIAGNOSIS: Stage IIIB (T2a, N3, M0) non-small cell lung cancer, adenocarcinoma with positive EGFR mutation diagnosed in January 2010.  PRIOR THERAPY: 1) Status post concurrent chemoradiation with weekly carboplatin and paclitaxel; last dose given March 21, 2009.  2) Tarceva at 150 mg p.o. daily, status post approximately 48 months of treatment, discontinued secondary to persistent diarrhea.  3) status post right Pleurx catheter placement for right nonmalignant pleural effusion.  CURRENT THERAPY: Tarceva 100 mg by mouth daily started 03/19/2013, status post 79 months of treatment.  INTERVAL HISTORY: Dana [name] 84 y.o. female returns to the clinic today for follow-up visit.  The patient is feeling fine today with no concerning complaints except for mild fatigue.  She also has shortness of breath with exertion.  She denied having any fever or chills.  She has no chest pain, cough or hemoptysis.  She denied having any nausea, vomiting, diarrhea or constipation.  She has no headache or visual changes.  She has been tolerating her treatment with Tarceva fairly well.  The patient is here today for evaluation with repeat CT scan of the chest for restaging of her disease.  MEDICAL HISTORY: Past Medical History:  Diagnosis Date   Arthritis    HANDS,  WRISTS   COPD (chronic obstructive pulmonary disease) ([place])    Depression 04/04/2017   Dyspnea on exertion    Encounter for therapeutic drug monitoring 10/01/2016   GERD (gastroesophageal reflux disease)    Hemorrhoid    History of anal fissures    History of lung cancer ONCOLOGIST--  DR [name]--  LAST CT ,  NO RECURRENCE OR METS   DX JAN 2010 --  STAGE IIIA  NON-SMALL CELL ADENOCARCINOMA (RIGHT MIDDLE LOBE)---  S/P  CHEMORADIATIO THERAPY  (COMPLETE 03-21-2009)   HTN  (hypertension) 10/10/2017   Imbalance 06/04/2017   lung ca dx'd 2010   Normal cardiac stress test    2007  PER PT   Osteoporosis    Rash, skin    RIGHT FOREARM/ HAND   Right wrist pain    MASS   Wears glasses     ALLERGIES:  is allergic to amoxicillin and protonix [pantoprazole sodium].  MEDICATIONS:  Current Outpatient Medications  Medication Sig Dispense Refill   aspirin EC 81 MG tablet Take 81 mg by mouth daily.     augmented betamethasone dipropionate (DIPROLENE-AF) 0.05 % ointment Apply 1 application topically 2 (two) times daily as needed.     bismuth subsalicylate (PEPTO BISMOL) 262 MG/15ML suspension Take 30 mLs by mouth every three (3) days as needed.     Calcium Carb-Cholecalciferol (CALCIUM-VITAMIN D) 500-200 MG-UNIT tablet Take by mouth.     Calcium Carbonate-Vitamin D 600-400 MG-UNIT per chew tablet Chew 1 tablet by mouth 2 (two) times daily.     cholecalciferol (VITAMIN D) 1000 UNITS tablet Take 1,000 Units by mouth daily.      clindamycin (CLEOCIN T) 1 % external solution APPLY  SOLUTION TOPICALLY TO AFFECTED AREA(S) TWICE DAILY 60 mL 0   Coenzyme Q10 (COQ10) 100 MG CAPS Take 1 capsule by mouth daily.      Cyanocobalamin (B-12) 1000 MCG CAPS Take 1,000 mcg by mouth 3 (three) times daily with meals.      erlotinib (TARCEVA) 100 MG tablet Take 1 tablet (100 mg total) by mouth daily. Take on an  empty stomach 1 hour before meals or 2 hours after 30 tablet 5   loperamide (IMODIUM A-D) 2 MG tablet Take 1 mg by mouth 4 (four) times daily as needed for diarrhea or loose stools.      loratadine (CLARITIN) 10 MG tablet Take 10 mg by mouth daily as needed for allergies.      LORazepam (ATIVAN) 0.5 MG tablet Take 1 tablet (0.5 mg total) by mouth every 8 (eight) hours as needed for anxiety. 30 tablet 0   meloxicam (MOBIC) 15 MG tablet Take 15 mg by mouth daily.  4   mirtazapine (REMERON) 15 MG tablet TAKE 1 TABLET BY MOUTH AT BEDTIME 30 tablet 0   Polyethyl  Glycol-Propyl Glycol (SYSTANE OP) Place 1 drop into both eyes at bedtime as needed (for dry eyes.).      psyllium (METAMUCIL) 58.6 % powder Take 1 packet by mouth daily as needed.     Simethicone (GAS-X PO) Take 1 tablet by mouth 2 (two) times daily as needed (for gas).      Tiotropium Bromide-Olodaterol (STIOLTO RESPIMAT) 2.5-2.5 MCG/ACT AERS Inhale 2 puffs into the lungs daily. 4 g 11   No current facility-administered medications for this visit.     SURGICAL HISTORY:  Past Surgical History:  Procedure Laterality Date   ANAL FISSURE REPAIR  07/09/2011   INTERNAL SPHINCTEROTOMY   BENIGN EXCISION LEFT BREAST CENTRAL DUCT  02/1999   BREAST BIOPSY  01/31/2009   benign   BREAST EXCISIONAL BIOPSY Left 2004   benign   CHEST TUBE INSERTION Right 07/14/2014   Procedure: INSERTION PLEURAL DRAINAGE CATHETER RIGHT CHEST;  Surgeon: Grace Isaac, MD;  Location: [facility];  Service: Thoracic;  Laterality: Right;   EXCISION RIGHT WRIST MASS  2012   EXTRACORPOREAL SHOCK WAVE LITHOTRIPSY Left 06/01/2019   Procedure: EXTRACORPOREAL SHOCK WAVE LITHOTRIPSY (ESWL);  Surgeon: Cleon Gustin, MD;  Location: [facility];  Service: Urology;  Laterality: Left;   KNEE ARTHROSCOPY Right 1999   MASS EXCISION Right 03/11/2014   Procedure: RIGHT WRIST DEEP MASS EXCISION WITH CULTURE AND BIOSPY;  Surgeon: Linna Hoff, MD;  Location: [facility];  Service: Orthopedics;  Laterality: Right;   REMOVAL OF PLEURAL DRAINAGE CATHETER Right 10/14/2014   Procedure: REMOVAL OF PLEURAL DRAINAGE CATHETER;  Surgeon: Grace Isaac, MD;  Location: [facility];  Service: Thoracic;  Laterality: Right;   TALC PLEURODESIS Right 09/16/2014   Procedure: TALC PLEURADESIS/ slurry;  Surgeon: Grace Isaac, MD;  Location: [facility];  Service: Thoracic;  Laterality: Right;   THORACOSCOPY  01-26-2009   w/   lung/  node biopsy's   THUMB ARTHROSCOPY Left    - removed bone spur   TONSILLECTOMY  AS CHILD   TOTAL  ABDOMINAL HYSTERECTOMY W/ BILATERAL SALPINGOOPHORECTOMY  1982   W/  APPENDECTOMY   TRANSTHORACIC ECHOCARDIOGRAM  12-23-2008   NORMAL LV/  EF 65-70%/  MILD MR  &  TR   VAULT SUSPENSION PLUS CYSTOCELE REPAIR WITH GRAFT  06-13-2010    REVIEW OF SYSTEMS:  Constitutional: positive for fatigue Eyes: negative Ears, nose, mouth, throat, and face: negative Respiratory: positive for dyspnea on exertion Cardiovascular: negative Gastrointestinal: negative Genitourinary:negative Integument/breast: negative Hematologic/lymphatic: negative Musculoskeletal:negative Neurological: negative Behavioral/Psych: negative Endocrine: negative Allergic/Immunologic: negative   PHYSICAL EXAMINATION: General appearance: alert, cooperative, fatigued and no distress Head: Normocephalic, without obvious abnormality, atraumatic Neck: no adenopathy, no JVD, supple, symmetrical, trachea midline and thyroid not enlarged, symmetric, no tenderness/mass/nodules Lymph nodes: Cervical, supraclavicular, and axillary  nodes normal. Resp: clear to auscultation bilaterally Back: symmetric, no curvature. ROM normal. No CVA tenderness. Cardio: regular rate and rhythm, S1, S2 normal, no murmur, click, rub or gallop GI: soft, non-tender; bowel sounds normal; no masses,  no organomegaly Extremities: extremities normal, atraumatic, no cyanosis or edema Neurologic: Alert and oriented X 3, normal strength and tone. Normal symmetric reflexes. Normal coordination and gait  ECOG PERFORMANCE STATUS: 1 - Symptomatic but completely ambulatory  Blood pressure (!) 148/96, pulse 97, temperature 98.5 F (36.9 C), temperature source Temporal, resp. rate 17, height _0  (1.626 m), weight 114 lb 4.8 oz (51.8 kg), SpO2 96 %.  LABORATORY DATA: Lab Results  Component Value Date   WBC 7.2 10/16/2019   HGB 12.4 10/16/2019   HCT 36.8 10/16/2019   MCV 92.9 10/16/2019   PLT 285 10/16/2019      Chemistry      Component Value Date/Time    NA 142 10/16/2019 1016   NA 142 12/10/2017 1000   K 4.0 10/16/2019 1016   K 4.0 12/10/2017 1000   CL 108 10/16/2019 1016   CL 108 (H) 06/03/2013 0939   CO2 24 10/16/2019 1016   CO2 28 12/10/2017 1000   BUN 26 (H) 10/16/2019 1016   BUN 20.4 12/10/2017 1000   CREATININE 1.10 (H) 10/16/2019 1016   CREATININE 0.9 12/10/2017 1000      Component Value Date/Time   CALCIUM 8.9 10/16/2019 1016   CALCIUM 9.7 12/10/2017 1000   ALKPHOS 57 10/16/2019 1016   ALKPHOS 63 12/10/2017 1000   AST 25 10/16/2019 1016   AST 25 12/10/2017 1000   ALT 18 10/16/2019 1016   ALT 17 12/10/2017 1000   BILITOT 0.8 10/16/2019 1016   BILITOT 1.05 12/10/2017 1000       RADIOGRAPHIC STUDIES: Ct Chest W Contrast  Result Date: 10/16/2019 CLINICAL DATA:  Non-small cell lung cancer EXAM: CT CHEST WITH CONTRAST TECHNIQUE: Multidetector CT imaging of the chest was performed during intravenous contrast administration. CONTRAST:  104m OMNIPAQUE IOHEXOL 300 MG/ML  SOLN COMPARISON:  06/12/2019 FINDINGS: Cardiovascular: The heart is normal in size. No pericardial effusion. No evidence of thoracic aortic aneurysm. Atherosclerotic calcifications of the aortic arch. Three vessel coronary atherosclerosis. Mediastinum/Nodes: No suspicious mediastinal lymphadenopathy. Visualized thyroid is unremarkable. Lungs/Pleura: Postsurgical changes in the medial right lower lobe/paramediastinal region. 14 x 11 mm subsolid nodule in the left lung apex associated 5 mm solid component (series 7/image 11), grossly unchanged. Additional 11 mm mixed density nodule in the anterior left upper lobe (series 7/image 49), unchanged. Trace right pleural effusion. Pleural-based calcifications at the right lung base, unchanged. Mild centrilobular and paraseptal emphysematous changes, upper lung predominant. No pneumothorax. Upper Abdomen: Visualized upper abdomen is grossly unremarkable. Musculoskeletal: Mild degenerative changes of the visualized thoracolumbar  spine. IMPRESSION: Radiation changes in the right hemithorax. Stable subsolid left lung nodules, as above. Continued attention on follow-up is suggested. No evidence of new/progressive metastatic disease. Aortic Atherosclerosis (ICD10-I70.0) and Emphysema (ICD10-J43.9). Electronically Signed   By: SJulian HyM.D.   On: 10/16/2019 13:03    ASSESSMENT AND PLAN:  This is a very pleasant 84years old white female with stage IIIB non-small cell lung cancer, adenocarcinoma with positive EGFR mutation diagnosed in January 2010. She has been on treatment with Tarceva for more than 9 years.   She is currently on Tarceva 100 mg p.o. daily status post 79 months. The patient has been tolerating this treatment well with no concerning adverse effects. She had repeat  CT scan of the chest performed recently.  I personally and independently reviewed the scans and discussed the results with the patient today. Her scan showed no concerning findings for disease progression. I recommended for the patient to continue her current treatment with Tarceva. For hypertension, I advised her to take her blood pressure medication as prescribed and to monitor it closely at home. I will see her back for follow-up visit in 2 months for evaluation with repeat blood work. The patient was advised to call immediately if she has any concerning symptoms in the interval. The patient voices understanding of current disease status and treatment options and is in agreement with the current care plan. All questions were answered. The patient knows to call the clinic with any problems, questions or concerns. We can certainly see the patient much sooner if necessary. Disclaimer: This note was dictated with voice recognition software. Similar sounding words can inadvertently be transcribed and may not be corrected upon review.

## 2019-10-20 ENCOUNTER — Telehealth: Payer: Self-pay | Admitting: Internal Medicine

## 2019-10-20 NOTE — Telephone Encounter (Signed)
Scheduled per los. Called and spoke with patient. Confirmed appt 

## 2019-11-08 ENCOUNTER — Other Ambulatory Visit: Payer: Self-pay | Admitting: Internal Medicine

## 2019-11-08 DIAGNOSIS — C3491 Malignant neoplasm of unspecified part of right bronchus or lung: Secondary | ICD-10-CM

## 2019-11-09 ENCOUNTER — Ambulatory Visit: Payer: Medicare Other | Admitting: Internal Medicine

## 2019-11-20 ENCOUNTER — Other Ambulatory Visit: Payer: Self-pay | Admitting: Medical Oncology

## 2019-11-20 DIAGNOSIS — C3491 Malignant neoplasm of unspecified part of right bronchus or lung: Secondary | ICD-10-CM

## 2019-11-20 MED ORDER — ERLOTINIB HCL 100 MG PO TABS: 100.0000 mg | ORAL_TABLET | Freq: Every day | ORAL | 3 refills | Status: DC

## 2019-12-09 ENCOUNTER — Other Ambulatory Visit: Payer: Self-pay | Admitting: Internal Medicine

## 2019-12-09 DIAGNOSIS — C3491 Malignant neoplasm of unspecified part of right bronchus or lung: Secondary | ICD-10-CM

## 2019-12-09 NOTE — Telephone Encounter (Signed)
Refill requested

## 2019-12-15 ENCOUNTER — Ambulatory Visit: Payer: Medicare Other | Admitting: Family Medicine

## 2019-12-15 ENCOUNTER — Ambulatory Visit (INDEPENDENT_AMBULATORY_CARE_PROVIDER_SITE_OTHER): Payer: Medicare Other

## 2019-12-15 ENCOUNTER — Encounter: Payer: Self-pay | Admitting: Family Medicine

## 2019-12-15 ENCOUNTER — Ambulatory Visit: Payer: Medicare Other

## 2019-12-15 ENCOUNTER — Other Ambulatory Visit: Payer: Self-pay

## 2019-12-15 VITALS — BP 150/98 | Ht 64.0 in | Wt 112.0 lb

## 2019-12-15 DIAGNOSIS — M25561 Pain in right knee: Secondary | ICD-10-CM

## 2019-12-15 NOTE — Progress Notes (Signed)
Subjective:    CC: R knee pain  I, Dana [name], LAT, ATC, am serving as scribe for Dr. Lynne [name].  HPI: Pt is an 84/y/o female presenting w/ c/o R knee pain x 2.5 years.  She has a history of R knee arthroscopic surgery approximately 20 years ago.  She is having pain w/ ascending stairs and w/ prolonged walking.  Aggravating factors also include knee rotation.  She reports having to walk w/ her R leg externally rotated to decrease the pain w/ walking.  She also reports R knee instability and giving way.  She denies any R LE radiating pain, mechanical symptoms  or R knee swelling.  Pt has tried a R knee brace and Aspercreme.  She has also tried her husband's Voltaren gel which provides temporary relief.  Past medical history, Surgical history, Family history not pertinant except as noted below, Social history, Allergies, and medications have been entered into the medical record, reviewed, and no changes needed.   Review of Systems: No headache, visual changes, nausea, vomiting, diarrhea, constipation, dizziness, abdominal pain, skin rash, fevers, chills, night sweats, weight loss, swollen lymph nodes, body aches, joint swelling, muscle aches, chest pain, shortness of breath, mood changes, visual or auditory hallucinations.   Objective:    Vitals:   12/15/19 0954  BP: (!) 150/98   General: Well Developed, well nourished, and in no acute distress.  Neuro/Psych: Alert and oriented x3, extra-ocular muscles intact, able to move all 4 extremities, sensation grossly intact. Skin: Warm and dry, no rashes noted.  Respiratory: Not using accessory muscles, speaking in full sentences, trachea midline.  Cardiovascular: Pulses palpable, no extremity edema. Abdomen: Does not appear distended. MSK: . Right knee: Minimal effusion.  Bossing medial joint line present otherwise normal-appearing Range of motion 0-120 degrees with crepitation. Tender palpation medial joint line.  Nontender  lateral. Intact flexion and extension strength. Minimal laxity MCL stress test.  Normal LCL stress test. Negative Murray's test.  Lab and Radiology Results  Procedure: Real-time Ultrasound Guided Injection of right knee joint Device: Philips Affiniti 50G Images permanently stored and available for review in the ultrasound unit. Verbal informed consent obtained.  Discussed risks and benefits of procedure. Warned about infection bleeding damage to structures skin hypopigmentation and fat atrophy among others. Patient expresses understanding and agreement Time-out conducted.   Noted no overlying erythema, induration, or other signs of local infection.   Skin prepped in a sterile fashion.   Local anesthesia: Topical Ethyl chloride.   With sterile technique and under real time ultrasound guidance:  40 mg of Kenalog and 4 mL of Marcaine injected easily.   Completed without difficulty   Of note: Significant narrowing in medial compartment seen on ultrasound examination prior to injection. Pain immediately resolved suggesting accurate placement of the medication.   Advised to call if fevers/chills, erythema, induration, drainage, or persistent bleeding.   Images permanently stored and available for review in the ultrasound unit.  Impression: Technically successful ultrasound guided injection.    X-ray right knee ordered today.  Unfortunately x-ray is not available at the time of service.  Patient will schedule for a different time or location to get her knee x-ray in the near future.    Impression and Recommendations:    Assessment and Plan: 84 y.o. female with right knee pain due to DJD.  Plan for steroid injection today.  Also will proceed with home exercise program as taught by ATC focused on straight leg raise and short arc  quad open chain exercises.  Additionally recommend topical diclofenac gel.  Check back with me in 1 month.  Return sooner if needed..  [id number]; 15 additional minutes  spent for Therapeutic exercises as stated in above notes.  This included exercises focusing on stretching, strengthening, with significant focus on eccentric aspects.   Long term goals include an improvement in range of motion, strength, endurance as well as avoiding reinjury. Patient's frequency would include in 1-2 times a day, 3-5 times a week for a duration of 6-12 weeks.  Proper technique shown and discussed handout in great detail with ATC.  All questions were discussed and answered.   Orders Placed This Encounter  Procedures  . [place] - LOWER Extremity - Limited - RIGHT    Order Specific Question:   Reason for Exam (SYMPTOM  OR DIAGNOSIS REQUIRED)    Answer:   R knee pain    Order Specific Question:   Preferred imaging location?    Answer:   [facility]  . XR Knee Complete 4 Views Right    Please include patellar sunrise, lateral, and weightbearing bilateral AP and bilateral rosenberg views    Standing Status:   Future    Standing Expiration Date:   02/13/2021    Order Specific Question:   Reason for exam:    Answer:   Please include patellar sunrise, lateral, and weightbearing AP and rosenberg views    Comments:   Please include patellar sunrise, lateral, and weightbearing bilateral AP and bilateral rosenberg views    Order Specific Question:   Preferred imaging location?    Answer:   [facility]   No orders of the defined types were placed in this encounter.   Discussed warning signs or symptoms. Please see discharge instructions. Patient expresses understanding.  The above documentation has been reviewed and is accurate and complete Dana [name]

## 2019-12-15 NOTE — Patient Instructions (Addendum)
Thank you for coming in today. Call or go to the ER if you develop a large red swollen joint with extreme pain or oozing puss.   Please perform the exercise program that we have prepared for you and gone over in detail on a daily basis.  In addition to the handout you were provided you can access your program through: [email or web address]   Your unique program code is:  [email or web address]  Use the voltaren gel.  Do the exercises we discussed.  Recheck with me in 1 month.  Return sooner if needed.   Get xray today on your way out.   Avoid things that hurt a lot.

## 2019-12-21 ENCOUNTER — Other Ambulatory Visit: Payer: Self-pay

## 2019-12-21 ENCOUNTER — Encounter: Payer: Self-pay | Admitting: Internal Medicine

## 2019-12-21 ENCOUNTER — Ambulatory Visit (INDEPENDENT_AMBULATORY_CARE_PROVIDER_SITE_OTHER)
Admission: RE | Admit: 2019-12-21 | Discharge: 2019-12-21 | Disposition: A | Discharge status: Home / Self Care | Payer: Medicare Other | Source: Ambulatory Visit | Attending: Family Medicine | Admitting: Family Medicine

## 2019-12-21 ENCOUNTER — Inpatient Hospital Stay: Payer: Medicare Other

## 2019-12-21 ENCOUNTER — Other Ambulatory Visit: Payer: Self-pay | Admitting: Family Medicine

## 2019-12-21 ENCOUNTER — Inpatient Hospital Stay: Payer: Medicare Other | Attending: Internal Medicine | Admitting: Internal Medicine

## 2019-12-21 ENCOUNTER — Telehealth: Payer: Self-pay | Admitting: Internal Medicine

## 2019-12-21 VITALS — BP 148/80 | HR 103 | Temp 97.8°F | Resp 19 | Ht 64.0 in | Wt 113.3 lb

## 2019-12-21 DIAGNOSIS — I10 Essential (primary) hypertension: Secondary | ICD-10-CM | POA: Insufficient documentation

## 2019-12-21 DIAGNOSIS — C342 Malignant neoplasm of middle lobe, bronchus or lung: Secondary | ICD-10-CM | POA: Diagnosis not present

## 2019-12-21 DIAGNOSIS — Z79899 Other long term (current) drug therapy: Secondary | ICD-10-CM | POA: Diagnosis not present

## 2019-12-21 DIAGNOSIS — Z7982 Long term (current) use of aspirin: Secondary | ICD-10-CM | POA: Diagnosis not present

## 2019-12-21 DIAGNOSIS — F3289 Other specified depressive episodes: Secondary | ICD-10-CM | POA: Diagnosis not present

## 2019-12-21 DIAGNOSIS — J449 Chronic obstructive pulmonary disease, unspecified: Secondary | ICD-10-CM | POA: Insufficient documentation

## 2019-12-21 DIAGNOSIS — F329 Major depressive disorder, single episode, unspecified: Secondary | ICD-10-CM | POA: Insufficient documentation

## 2019-12-21 DIAGNOSIS — C3491 Malignant neoplasm of unspecified part of right bronchus or lung: Secondary | ICD-10-CM | POA: Diagnosis not present

## 2019-12-21 DIAGNOSIS — M25561 Pain in right knee: Secondary | ICD-10-CM | POA: Diagnosis not present

## 2019-12-21 DIAGNOSIS — Z5181 Encounter for therapeutic drug level monitoring: Secondary | ICD-10-CM | POA: Diagnosis not present

## 2019-12-21 DIAGNOSIS — M129 Arthropathy, unspecified: Secondary | ICD-10-CM | POA: Insufficient documentation

## 2019-12-21 DIAGNOSIS — K219 Gastro-esophageal reflux disease without esophagitis: Secondary | ICD-10-CM | POA: Insufficient documentation

## 2019-12-21 LAB — CMP (CANCER CENTER ONLY)
ALT: 27 U/L (ref 0–44)
AST: 25 U/L (ref 15–41)
Albumin: 3.6 g/dL (ref 3.5–5.0)
Alkaline Phosphatase: 48 U/L (ref 38–126)
Anion gap: 8 (ref 5–15)
BUN: 20 mg/dL (ref 8–23)
CO2: 26 mmol/L (ref 22–32)
Calcium: 8.6 mg/dL — ABNORMAL LOW (ref 8.9–10.3)
Chloride: 107 mmol/L (ref 98–111)
Creatinine: 0.81 mg/dL (ref 0.44–1.00)
GFR, Est AFR Am: >60 mL/min (ref >60)
GFR, Estimated: >60 mL/min (ref >60)
Glucose, Bld: 89 mg/dL (ref 70–99)
Potassium: 3.7 mmol/L (ref 3.5–5.1)
Sodium: 141 mmol/L (ref 135–145)
Total Bilirubin: 1 mg/dL (ref 0.3–1.2)
Total Protein: 6.1 g/dL — ABNORMAL LOW (ref 6.5–8.1)

## 2019-12-21 LAB — CBC WITH DIFFERENTIAL (CANCER CENTER ONLY)
Abs Immature Granulocytes: 0.03 10*3/uL (ref 0.00–0.07)
Basophils Absolute: 0 10*3/uL (ref 0.0–0.1)
Basophils Relative: 0 %
Eosinophils Absolute: 0.1 10*3/uL (ref 0.0–0.5)
Eosinophils Relative: 2 %
HCT: 37.2 % (ref 36.0–46.0)
Hemoglobin: 12.5 g/dL (ref 12.0–15.0)
Immature Granulocytes: 0 %
Lymphocytes Relative: 13 %
Lymphs Abs: 1.1 10*3/uL (ref 0.7–4.0)
MCH: 32.5 pg (ref 26.0–34.0)
MCHC: 33.6 g/dL (ref 30.0–36.0)
MCV: 96.6 fL (ref 80.0–100.0)
Monocytes Absolute: 0.8 10*3/uL (ref 0.1–1.0)
Monocytes Relative: 9 %
Neutro Abs: 6.5 10*3/uL (ref 1.7–7.7)
Neutrophils Relative %: 76 %
Platelet Count: 298 10*3/uL (ref 150–400)
RBC: 3.85 MIL/uL — ABNORMAL LOW (ref 3.87–5.11)
RDW: 13.9 % (ref 11.5–15.5)
WBC Count: 8.6 10*3/uL (ref 4.0–10.5)
nRBC: 0 % (ref 0.0–0.2)

## 2019-12-21 MED ORDER — CLONIDINE HCL 0.1 MG PO TABS
ORAL_TABLET | ORAL | Status: AC
  Filled 2019-12-21: qty 2

## 2019-12-21 MED ORDER — CLONIDINE HCL 0.1 MG PO TABS
0.2000 mg | ORAL_TABLET | Freq: Once | ORAL | Status: AC
  Administered 2019-12-21: 0.2 mg via ORAL

## 2019-12-21 NOTE — Telephone Encounter (Signed)
Scheduled per 12/28 los, patient received after visit summary and calender.

## 2019-12-21 NOTE — Progress Notes (Signed)
[facility] [phone]   [phone]  OFFICE PROGRESS NOTE  [name], MD [place] [address]  DIAGNOSIS: Stage IIIB (T2a, N3, M0) non-small cell lung cancer, adenocarcinoma with positive EGFR mutation diagnosed in January 2010.  PRIOR THERAPY: 1) Status post concurrent chemoradiation with weekly carboplatin and paclitaxel; last dose given March 21, 2009.  2) Tarceva at 150 mg p.o. daily, status post approximately 48 months of treatment, discontinued secondary to persistent diarrhea.  3) status post right Pleurx catheter placement for right nonmalignant pleural effusion.  CURRENT THERAPY: Tarceva 100 mg by mouth daily started 03/19/2013, status post 81 months of treatment.  INTERVAL HISTORY: Dana [name] 84 y.o. female returns to the clinic today for follow-up visit.  The patient is feeling fine today with no concerning complaints.  She has some stressful event recently and the family with her son who had a back surgery and daughter-in-law recently diagnosed with head and neck cancer.  The patient denied having any current chest pain, shortness of breath, cough or hemoptysis.  She denied having any fever or chills.  She has no nausea, vomiting, diarrhea or constipation.  Her blood pressure is elevated today.  She continues to tolerate her treatment with Tarceva fairly well.  She is here for evaluation and repeat blood work.  MEDICAL HISTORY: Past Medical History:  Diagnosis Date  . Arthritis    HANDS,  WRISTS  . COPD (chronic obstructive pulmonary disease) ([place])   . Depression 04/04/2017  . Dyspnea on exertion   . Encounter for therapeutic drug monitoring 10/01/2016  . GERD (gastroesophageal reflux disease)   . Hemorrhoid   . History of anal fissures   . History of lung cancer ONCOLOGIST--  DR [facility]--  LAST CT ,  NO RECURRENCE OR METS   DX JAN 2010 --  STAGE IIIA  NON-SMALL CELL ADENOCARCINOMA (RIGHT MIDDLE  LOBE)---  S/P  CHEMORADIATIO THERAPY  (COMPLETE 03-21-2009)  . HTN (hypertension) 10/10/2017  . Imbalance 06/04/2017  . lung ca dx'd 2010  . Normal cardiac stress test    2007  PER PT  . Osteoporosis   . Rash, skin    RIGHT FOREARM/ HAND  . Right wrist pain    MASS  . Wears glasses     ALLERGIES:  is allergic to amoxicillin and protonix [pantoprazole sodium].  MEDICATIONS:  Current Outpatient Medications  Medication Sig Dispense Refill  . aspirin EC 81 MG tablet Take 81 mg by mouth daily.    Marland Kitchen augmented betamethasone dipropionate (DIPROLENE-AF) 0.05 % ointment Apply 1 application topically 2 (two) times daily as needed.    . bismuth subsalicylate (PEPTO BISMOL) 262 MG/15ML suspension Take 30 mLs by mouth every three (3) days as needed.    . Calcium Carb-Cholecalciferol (CALCIUM-VITAMIN D) 500-200 MG-UNIT tablet Take by mouth.    . Calcium Carbonate-Vitamin D 600-400 MG-UNIT per chew tablet Chew 1 tablet by mouth 2 (two) times daily.    . cholecalciferol (VITAMIN D) 1000 UNITS tablet Take 1,000 Units by mouth daily.     . clindamycin (CLEOCIN T) 1 % external solution APPLY  SOLUTION TOPICALLY TO AFFECTED AREA(S) TWICE DAILY 60 mL 0  . Coenzyme Q10 (COQ10) 100 MG CAPS Take 1 capsule by mouth daily.     . Cyanocobalamin (B-12) 1000 MCG CAPS Take 1,000 mcg by mouth 3 (three) times daily with meals.     . erlotinib (TARCEVA) 100 MG tablet Take 1 tablet (100  mg total) by mouth daily. Take on an empty stomach 1 hour before meals or 2 hours after 30 tablet 3  . loperamide (IMODIUM A-D) 2 MG tablet Take 1 mg by mouth 4 (four) times daily as needed for diarrhea or loose stools.     Marland Kitchen loratadine (CLARITIN) 10 MG tablet Take 10 mg by mouth daily as needed for allergies.     Marland Kitchen LORazepam (ATIVAN) 0.5 MG tablet Take 1 tablet (0.5 mg total) by mouth every 8 (eight) hours as needed for anxiety. 30 tablet 0  . meloxicam (MOBIC) 15 MG tablet Take 15 mg by mouth daily.  4  . mirtazapine (REMERON) 15 MG  tablet TAKE 1 TABLET BY MOUTH AT BEDTIME 30 tablet 0  . Polyethyl Glycol-Propyl Glycol (SYSTANE OP) Place 1 drop into both eyes at bedtime as needed (for dry eyes.).     Marland Kitchen psyllium (METAMUCIL) 58.6 % powder Take 1 packet by mouth daily as needed.    . Simethicone (GAS-X PO) Take 1 tablet by mouth 2 (two) times daily as needed (for gas).     . Tiotropium Bromide-Olodaterol (STIOLTO RESPIMAT) 2.5-2.5 MCG/ACT AERS Inhale 2 puffs into the lungs daily. 4 g 11   No current facility-administered medications for this visit.    SURGICAL HISTORY:  Past Surgical History:  Procedure Laterality Date  . ANAL FISSURE REPAIR  07/09/2011   INTERNAL SPHINCTEROTOMY  . BENIGN EXCISION LEFT BREAST CENTRAL DUCT  02/1999  . BREAST BIOPSY  01/31/2009   benign  . BREAST EXCISIONAL BIOPSY Left 2004   benign  . CHEST TUBE INSERTION Right 07/14/2014   Procedure: INSERTION PLEURAL DRAINAGE CATHETER RIGHT CHEST;  Surgeon: Grace Isaac, MD;  Location: [facility];  Service: Thoracic;  Laterality: Right;  . EXCISION RIGHT WRIST MASS  2012  . EXTRACORPOREAL SHOCK WAVE LITHOTRIPSY Left 06/01/2019   Procedure: EXTRACORPOREAL SHOCK WAVE LITHOTRIPSY (ESWL);  Surgeon: Cleon Gustin, MD;  Location: [facility];  Service: Urology;  Laterality: Left;  . KNEE ARTHROSCOPY Right 1999  . MASS EXCISION Right 03/11/2014   Procedure: RIGHT WRIST DEEP MASS EXCISION WITH CULTURE AND BIOSPY;  Surgeon: Linna Hoff, MD;  Location: [facility];  Service: Orthopedics;  Laterality: Right;  . REMOVAL OF PLEURAL DRAINAGE CATHETER Right 10/14/2014   Procedure: REMOVAL OF PLEURAL DRAINAGE CATHETER;  Surgeon: Grace Isaac, MD;  Location: [facility];  Service: Thoracic;  Laterality: Right;  . TALC PLEURODESIS Right 09/16/2014   Procedure: TALC PLEURADESIS/ slurry;  Surgeon: Grace Isaac, MD;  Location: [facility];  Service: Thoracic;  Laterality: Right;  . THORACOSCOPY  01-26-2009   w/   lung/  node biopsy's  . THUMB ARTHROSCOPY Left     - removed bone spur  . TONSILLECTOMY  AS CHILD  . TOTAL ABDOMINAL HYSTERECTOMY W/ BILATERAL SALPINGOOPHORECTOMY  1982   W/  APPENDECTOMY  . TRANSTHORACIC ECHOCARDIOGRAM  12-23-2008   NORMAL LV/  EF 65-70%/  MILD MR  &  TR  . VAULT SUSPENSION PLUS CYSTOCELE REPAIR WITH GRAFT  06-13-2010    REVIEW OF SYSTEMS:  Constitutional: positive for fatigue Eyes: negative Ears, nose, mouth, throat, and face: negative Respiratory: negative Cardiovascular: negative Gastrointestinal: negative Genitourinary:negative Integument/breast: negative Hematologic/lymphatic: negative Musculoskeletal:negative Neurological: negative Behavioral/Psych: negative Endocrine: negative Allergic/Immunologic: negative   PHYSICAL EXAMINATION: General appearance: alert, cooperative and no distress Head: Normocephalic, without obvious abnormality, atraumatic Neck: no adenopathy, no JVD, supple, symmetrical, trachea midline and thyroid not enlarged, symmetric, no tenderness/mass/nodules Lymph nodes: Cervical, supraclavicular,  and axillary nodes normal. Resp: clear to auscultation bilaterally Back: symmetric, no curvature. ROM normal. No CVA tenderness. Cardio: regular rate and rhythm, S1, S2 normal, no murmur, click, rub or gallop GI: soft, non-tender; bowel sounds normal; no masses,  no organomegaly Extremities: extremities normal, atraumatic, no cyanosis or edema Neurologic: Alert and oriented X 3, normal strength and tone. Normal symmetric reflexes. Normal coordination and gait  ECOG PERFORMANCE STATUS: 1 - Symptomatic but completely ambulatory  Blood pressure (!) 178/93, pulse (!) 103, temperature 97.8 F (36.6 C), temperature source Temporal, resp. rate 19, height '5\' 4"'$  (1.626 m), weight 113 lb 4.8 oz (51.4 kg), SpO2 100 %.  LABORATORY DATA: Lab Results  Component Value Date   WBC 8.6 12/21/2019   HGB 12.5 12/21/2019   HCT 37.2 12/21/2019   MCV 96.6 12/21/2019   PLT 298 12/21/2019      Chemistry        Component Value Date/Time   NA 142 10/16/2019 1016   NA 142 12/10/2017 1000   K 4.0 10/16/2019 1016   K 4.0 12/10/2017 1000   CL 108 10/16/2019 1016   CL 108 (H) 06/03/2013 0939   CO2 24 10/16/2019 1016   CO2 28 12/10/2017 1000   BUN 26 (H) 10/16/2019 1016   BUN 20.4 12/10/2017 1000   CREATININE 1.10 (H) 10/16/2019 1016   CREATININE 0.9 12/10/2017 1000      Component Value Date/Time   CALCIUM 8.9 10/16/2019 1016   CALCIUM 9.7 12/10/2017 1000   ALKPHOS 57 10/16/2019 1016   ALKPHOS 63 12/10/2017 1000   AST 25 10/16/2019 1016   AST 25 12/10/2017 1000   ALT 18 10/16/2019 1016   ALT 17 12/10/2017 1000   BILITOT 0.8 10/16/2019 1016   BILITOT 1.05 12/10/2017 1000       RADIOGRAPHIC STUDIES: No results found.  ASSESSMENT AND PLAN:  This is a very pleasant 84 years old white female with stage IIIB non-small cell lung cancer, adenocarcinoma with positive EGFR mutation diagnosed in January 2010. She has been on treatment with Tarceva for more than 9 years.   She is currently on Tarceva 100 mg p.o. daily status post 81 months. The patient continues to tolerate her treatment well and her disease has been stable. I recommended for her to continue her current treatment with Tarceva with the same dose. For hypertension, the patient is currently under a lot of stress and her blood pressure is uncontrolled.  I will give the patient a dose of clonidine 0.2 mg orally x1.  She was also advised to take her blood pressure medication as prescribed and to reconsult with her primary care physician for adjustment of her medication if needed. I will see the patient back for follow-up visit in 2 months for evaluation with repeat blood work. The patient was advised to call immediately if she has any concerning symptoms in the interval. The patient voices understanding of current disease status and treatment options and is in agreement with the current care plan. All questions were answered. The  patient knows to call the clinic with any problems, questions or concerns. We can certainly see the patient much sooner if necessary. Disclaimer: This note was dictated with voice recognition software. Similar sounding words can inadvertently be transcribed and may not be corrected upon review.

## 2019-12-22 NOTE — Progress Notes (Signed)
X-ray knee shows significant arthritis.  No fractures.  Possible pseudogout is also present. We will discuss these findings in further detail at your follow-up visit in January.

## 2019-12-23 ENCOUNTER — Telehealth: Payer: Self-pay | Admitting: Family Medicine

## 2019-12-23 MED ORDER — COLCHICINE 0.6 MG PO TABS: 0.6000 mg | ORAL_TABLET | Freq: Every day | ORAL | 1 refills | Status: DC | PRN

## 2019-12-23 MED ORDER — COLCHICINE 0.6 MG PO TABS: 0.6000 mg | ORAL_TABLET | Freq: Every day | ORAL | 3 refills | Status: DC | PRN

## 2019-12-23 NOTE — Telephone Encounter (Signed)
-----   Message from [name] Poet, LAT sent at 12/22/2019 11:42 AM EST ----- Called and relayed R knee XR results w/ pt.  She verbalizes understanding and asks questions as to what psuedogout is.  States that her R knee is feeling better following her R knee injection but that it continues to bother her and that her R knee pain is not completely resolved.

## 2019-12-23 NOTE — Telephone Encounter (Signed)
Medication sent to [facility] [place] as directed. When the mail order pharmacy calls her she can certainly have them not send it.

## 2019-12-23 NOTE — Telephone Encounter (Signed)
Contacted pt r.e. chochicine rx and pt asks for script to be sent to [facility] on [address] in [place].

## 2019-12-23 NOTE — Telephone Encounter (Signed)
I prescribed colchicine to [facility] mail order pharmacy.  Take daily as needed for knee pain due to pseudogout.

## 2020-01-07 ENCOUNTER — Other Ambulatory Visit: Payer: Self-pay | Admitting: Internal Medicine

## 2020-01-07 DIAGNOSIS — C3491 Malignant neoplasm of unspecified part of right bronchus or lung: Secondary | ICD-10-CM

## 2020-01-12 ENCOUNTER — Ambulatory Visit: Payer: Medicare Other | Admitting: Family Medicine

## 2020-02-18 ENCOUNTER — Other Ambulatory Visit: Payer: Self-pay | Admitting: Internal Medicine

## 2020-02-18 DIAGNOSIS — C3491 Malignant neoplasm of unspecified part of right bronchus or lung: Secondary | ICD-10-CM

## 2020-02-23 ENCOUNTER — Encounter: Payer: Self-pay | Admitting: Internal Medicine

## 2020-02-23 ENCOUNTER — Other Ambulatory Visit: Payer: Self-pay

## 2020-02-23 ENCOUNTER — Inpatient Hospital Stay: Payer: Medicare Other | Attending: Internal Medicine

## 2020-02-23 ENCOUNTER — Telehealth: Payer: Self-pay | Admitting: Internal Medicine

## 2020-02-23 ENCOUNTER — Inpatient Hospital Stay: Payer: Medicare Other | Admitting: Internal Medicine

## 2020-02-23 DIAGNOSIS — Z7982 Long term (current) use of aspirin: Secondary | ICD-10-CM | POA: Insufficient documentation

## 2020-02-23 DIAGNOSIS — C342 Malignant neoplasm of middle lobe, bronchus or lung: Secondary | ICD-10-CM | POA: Insufficient documentation

## 2020-02-23 DIAGNOSIS — Z79899 Other long term (current) drug therapy: Secondary | ICD-10-CM | POA: Insufficient documentation

## 2020-02-23 DIAGNOSIS — J449 Chronic obstructive pulmonary disease, unspecified: Secondary | ICD-10-CM | POA: Insufficient documentation

## 2020-02-23 DIAGNOSIS — F329 Major depressive disorder, single episode, unspecified: Secondary | ICD-10-CM | POA: Insufficient documentation

## 2020-02-23 DIAGNOSIS — C3490 Malignant neoplasm of unspecified part of unspecified bronchus or lung: Secondary | ICD-10-CM | POA: Diagnosis not present

## 2020-02-23 DIAGNOSIS — K219 Gastro-esophageal reflux disease without esophagitis: Secondary | ICD-10-CM | POA: Insufficient documentation

## 2020-02-23 DIAGNOSIS — I10 Essential (primary) hypertension: Secondary | ICD-10-CM | POA: Diagnosis not present

## 2020-02-23 DIAGNOSIS — M129 Arthropathy, unspecified: Secondary | ICD-10-CM | POA: Diagnosis not present

## 2020-02-23 DIAGNOSIS — C3491 Malignant neoplasm of unspecified part of right bronchus or lung: Secondary | ICD-10-CM

## 2020-02-23 LAB — CBC WITH DIFFERENTIAL (CANCER CENTER ONLY)
Abs Immature Granulocytes: 0.01 10*3/uL (ref 0.00–0.07)
Basophils Absolute: 0 10*3/uL (ref 0.0–0.1)
Basophils Relative: 0 %
Eosinophils Absolute: 0.2 10*3/uL (ref 0.0–0.5)
Eosinophils Relative: 4 %
HCT: 38.2 % (ref 36.0–46.0)
Hemoglobin: 12.5 g/dL (ref 12.0–15.0)
Immature Granulocytes: 0 %
Lymphocytes Relative: 11 %
Lymphs Abs: 0.7 10*3/uL (ref 0.7–4.0)
MCH: 32.1 pg (ref 26.0–34.0)
MCHC: 32.7 g/dL (ref 30.0–36.0)
MCV: 98.2 fL (ref 80.0–100.0)
Monocytes Absolute: 0.5 10*3/uL (ref 0.1–1.0)
Monocytes Relative: 9 %
Neutro Abs: 4.6 10*3/uL (ref 1.7–7.7)
Neutrophils Relative %: 76 %
Platelet Count: 269 10*3/uL (ref 150–400)
RBC: 3.89 MIL/uL (ref 3.87–5.11)
RDW: 14.2 % (ref 11.5–15.5)
WBC Count: 6.1 10*3/uL (ref 4.0–10.5)
nRBC: 0 % (ref 0.0–0.2)

## 2020-02-23 LAB — CMP (CANCER CENTER ONLY)
ALT: 24 U/L (ref 0–44)
AST: 33 U/L (ref 15–41)
Albumin: 3.5 g/dL (ref 3.5–5.0)
Alkaline Phosphatase: 47 U/L (ref 38–126)
Anion gap: 10 (ref 5–15)
BUN: 19 mg/dL (ref 8–23)
CO2: 28 mmol/L (ref 22–32)
Calcium: 9.2 mg/dL (ref 8.9–10.3)
Chloride: 107 mmol/L (ref 98–111)
Creatinine: 0.98 mg/dL (ref 0.44–1.00)
GFR, Est AFR Am: >60 mL/min (ref >60)
GFR, Estimated: 53 mL/min — ABNORMAL LOW (ref >60)
Glucose, Bld: 88 mg/dL (ref 70–99)
Potassium: 3.9 mmol/L (ref 3.5–5.1)
Sodium: 145 mmol/L (ref 135–145)
Total Bilirubin: 0.9 mg/dL (ref 0.3–1.2)
Total Protein: 6.3 g/dL — ABNORMAL LOW (ref 6.5–8.1)

## 2020-02-23 NOTE — Progress Notes (Signed)
[place] [phone]   [phone]  OFFICE PROGRESS NOTE  [name], MD [place] [address]  DIAGNOSIS: Stage IIIB (T2a, N3, M0) non-small cell lung cancer, adenocarcinoma with positive EGFR mutation diagnosed in January 2010.  PRIOR THERAPY: 1) Status post concurrent chemoradiation with weekly carboplatin and paclitaxel; last dose given March 21, 2009.  2) Tarceva at 150 mg p.o. daily, status post approximately 48 months of treatment, discontinued secondary to persistent diarrhea.  3) status post right Pleurx catheter placement for right nonmalignant pleural effusion.  CURRENT THERAPY: Tarceva 100 mg by mouth daily started 03/19/2013, status post 83 months of treatment.  INTERVAL HISTORY: Dana [name] 85 y.o. female returns to the clinic today for follow-up visit. The patient is feeling fine today with no concerning complaints except for the uncontrolled blood pressure and she is not currently on any blood pressure medications. She denied having any current chest pain, shortness of breath, cough or hemoptysis. She denied having any fever or chills. She has no nausea, vomiting, diarrhea or constipation. She has some mild bruises in the lower extremities but she is currently on aspirin 81 mg p.o. daily. The patient is here today for evaluation and repeat blood work.  MEDICAL HISTORY: Past Medical History:  Diagnosis Date  . Arthritis    HANDS,  WRISTS  . COPD (chronic obstructive pulmonary disease) ([facility])   . Depression 04/04/2017  . Dyspnea on exertion   . Encounter for therapeutic drug monitoring 10/01/2016  . GERD (gastroesophageal reflux disease)   . Hemorrhoid   . History of anal fissures   . History of lung cancer ONCOLOGIST--  DR [facility]--  LAST CT ,  NO RECURRENCE OR METS   DX JAN 2010 --  STAGE IIIA  NON-SMALL CELL ADENOCARCINOMA (RIGHT MIDDLE LOBE)---  S/P  CHEMORADIATIO THERAPY  (COMPLETE 03-21-2009)  .  HTN (hypertension) 10/10/2017  . Imbalance 06/04/2017  . lung ca dx'd 2010  . Normal cardiac stress test    2007  PER PT  . Osteoporosis   . Rash, skin    RIGHT FOREARM/ HAND  . Right wrist pain    MASS  . Wears glasses     ALLERGIES:  is allergic to amoxicillin and protonix [pantoprazole sodium].  MEDICATIONS:  Current Outpatient Medications  Medication Sig Dispense Refill  . aspirin EC 81 MG tablet Take 81 mg by mouth daily.    Marland Kitchen augmented betamethasone dipropionate (DIPROLENE-AF) 0.05 % ointment Apply 1 application topically 2 (two) times daily as needed.    . bismuth subsalicylate (PEPTO BISMOL) 262 MG/15ML suspension Take 30 mLs by mouth every three (3) days as needed.    . Calcium Carbonate-Vitamin D 600-400 MG-UNIT per chew tablet Chew 1 tablet by mouth 2 (two) times daily.    . cholecalciferol (VITAMIN D) 1000 UNITS tablet Take 1,000 Units by mouth daily.     . clindamycin (CLEOCIN T) 1 % external solution APPLY  SOLUTION TOPICALLY TO AFFECTED AREA(S) TWICE DAILY 60 mL 0  . Coenzyme Q10 (COQ10) 100 MG CAPS Take 1 capsule by mouth daily.     . colchicine 0.6 MG tablet Take 1 tablet (0.6 mg total) by mouth daily as needed (knee pain due to pseudogout). 30 tablet 3  . Cyanocobalamin (B-12) 1000 MCG CAPS Take 1,000 mcg by mouth 3 (three) times daily with meals.     . erlotinib (TARCEVA) 100 MG tablet Take 1 tablet (100 mg total) by  mouth daily. Take on an empty stomach 1 hour before meals or 2 hours after 30 tablet 3  . loperamide (IMODIUM A-D) 2 MG tablet Take 1 mg by mouth 4 (four) times daily as needed for diarrhea or loose stools.     Marland Kitchen loratadine (CLARITIN) 10 MG tablet Take 10 mg by mouth daily as needed for allergies.     Marland Kitchen LORazepam (ATIVAN) 0.5 MG tablet Take 1 tablet (0.5 mg total) by mouth every 8 (eight) hours as needed for anxiety. 30 tablet 0  . meloxicam (MOBIC) 15 MG tablet Take 15 mg by mouth daily.  4  . mirtazapine (REMERON) 15 MG tablet TAKE 1 TABLET BY MOUTH AT  BEDTIME 30 tablet 0  . Polyethyl Glycol-Propyl Glycol (SYSTANE OP) Place 1 drop into both eyes at bedtime as needed (for dry eyes.).     Marland Kitchen psyllium (METAMUCIL) 58.6 % powder Take 1 packet by mouth daily as needed.    . Simethicone (GAS-X PO) Take 1 tablet by mouth 2 (two) times daily as needed (for gas).     . Tiotropium Bromide-Olodaterol (STIOLTO RESPIMAT) 2.5-2.5 MCG/ACT AERS Inhale 2 puffs into the lungs daily. 4 g 11   No current facility-administered medications for this visit.    SURGICAL HISTORY:  Past Surgical History:  Procedure Laterality Date  . ANAL FISSURE REPAIR  07/09/2011   INTERNAL SPHINCTEROTOMY  . BENIGN EXCISION LEFT BREAST CENTRAL DUCT  02/1999  . BREAST BIOPSY  01/31/2009   benign  . BREAST EXCISIONAL BIOPSY Left 2004   benign  . CHEST TUBE INSERTION Right 07/14/2014   Procedure: INSERTION PLEURAL DRAINAGE CATHETER RIGHT CHEST;  Surgeon: Grace Isaac, MD;  Location: [facility];  Service: Thoracic;  Laterality: Right;  . EXCISION RIGHT WRIST MASS  2012  . EXTRACORPOREAL SHOCK WAVE LITHOTRIPSY Left 06/01/2019   Procedure: EXTRACORPOREAL SHOCK WAVE LITHOTRIPSY (ESWL);  Surgeon: Cleon Gustin, MD;  Location: [facility];  Service: Urology;  Laterality: Left;  . KNEE ARTHROSCOPY Right 1999  . MASS EXCISION Right 03/11/2014   Procedure: RIGHT WRIST DEEP MASS EXCISION WITH CULTURE AND BIOSPY;  Surgeon: Linna Hoff, MD;  Location: [facility];  Service: Orthopedics;  Laterality: Right;  . REMOVAL OF PLEURAL DRAINAGE CATHETER Right 10/14/2014   Procedure: REMOVAL OF PLEURAL DRAINAGE CATHETER;  Surgeon: Grace Isaac, MD;  Location: [facility];  Service: Thoracic;  Laterality: Right;  . TALC PLEURODESIS Right 09/16/2014   Procedure: TALC PLEURADESIS/ slurry;  Surgeon: Grace Isaac, MD;  Location: [facility];  Service: Thoracic;  Laterality: Right;  . THORACOSCOPY  01-26-2009   w/   lung/  node biopsy's  . THUMB ARTHROSCOPY Left    - removed bone spur  .  TONSILLECTOMY  AS CHILD  . TOTAL ABDOMINAL HYSTERECTOMY W/ BILATERAL SALPINGOOPHORECTOMY  1982   W/  APPENDECTOMY  . TRANSTHORACIC ECHOCARDIOGRAM  12-23-2008   NORMAL LV/  EF 65-70%/  MILD MR  &  TR  . VAULT SUSPENSION PLUS CYSTOCELE REPAIR WITH GRAFT  06-13-2010    REVIEW OF SYSTEMS:  A comprehensive review of systems was negative except for: Constitutional: positive for fatigue   PHYSICAL EXAMINATION: General appearance: alert, cooperative and no distress Head: Normocephalic, without obvious abnormality, atraumatic Neck: no adenopathy, no JVD, supple, symmetrical, trachea midline and thyroid not enlarged, symmetric, no tenderness/mass/nodules Lymph nodes: Cervical, supraclavicular, and axillary nodes normal. Resp: clear to auscultation bilaterally Back: symmetric, no curvature. ROM normal. No CVA tenderness. Cardio: regular rate and rhythm,  S1, S2 normal, no murmur, click, rub or gallop GI: soft, non-tender; bowel sounds normal; no masses,  no organomegaly Extremities: extremities normal, atraumatic, no cyanosis or edema  ECOG PERFORMANCE STATUS: 1 - Symptomatic but completely ambulatory  Blood pressure (!) 169/82, pulse 93, temperature 98.2 F (36.8 C), temperature source Temporal, resp. rate 18, height '5\' 4"'$  (1.626 m), weight 113 lb 8 oz (51.5 kg), SpO2 98 %.  LABORATORY DATA: Lab Results  Component Value Date   WBC 6.1 02/23/2020   HGB 12.5 02/23/2020   HCT 38.2 02/23/2020   MCV 98.2 02/23/2020   PLT 269 02/23/2020      Chemistry      Component Value Date/Time   NA 145 02/23/2020 1002   NA 142 12/10/2017 1000   K 3.9 02/23/2020 1002   K 4.0 12/10/2017 1000   CL 107 02/23/2020 1002   CL 108 (H) 06/03/2013 0939   CO2 28 02/23/2020 1002   CO2 28 12/10/2017 1000   BUN 19 02/23/2020 1002   BUN 20.4 12/10/2017 1000   CREATININE 0.98 02/23/2020 1002   CREATININE 0.9 12/10/2017 1000      Component Value Date/Time   CALCIUM 9.2 02/23/2020 1002   CALCIUM 9.7 12/10/2017  1000   ALKPHOS 47 02/23/2020 1002   ALKPHOS 63 12/10/2017 1000   AST 33 02/23/2020 1002   AST 25 12/10/2017 1000   ALT 24 02/23/2020 1002   ALT 17 12/10/2017 1000   BILITOT 0.9 02/23/2020 1002   BILITOT 1.05 12/10/2017 1000       RADIOGRAPHIC STUDIES: No results found.  ASSESSMENT AND PLAN:  This is a very pleasant 85 years old white female with stage IIIB non-small cell lung cancer, adenocarcinoma with positive EGFR mutation diagnosed in January 2010. She has been on treatment with Tarceva for more than 9 years.   She is currently on Tarceva 100 mg p.o. daily status post 83  months. She continues to tolerate her treatment with Tarceva fairly well with no concerning adverse effects. I recommended for her to continue her current treatment as planned. I will see her back for follow-up visit in 2 months for evaluation with repeat CT scan of the chest for restaging of her disease. For the hypertension, I strongly encouraged the patient to reach out to her primary care physician for consideration of treatment of her persistent hypertension. The patient was advised to call immediately if she has any concerning symptoms in the interval. The patient voices understanding of current disease status and treatment options and is in agreement with the current care plan. All questions were answered. The patient knows to call the clinic with any problems, questions or concerns. We can certainly see the patient much sooner if necessary. Disclaimer: This note was dictated with voice recognition software. Similar sounding words can inadvertently be transcribed and may not be corrected upon review.

## 2020-02-23 NOTE — Telephone Encounter (Signed)
Scheduled per los. Gave avs and calendar  

## 2020-03-22 ENCOUNTER — Other Ambulatory Visit: Payer: Self-pay | Admitting: Internal Medicine

## 2020-03-22 DIAGNOSIS — C3491 Malignant neoplasm of unspecified part of right bronchus or lung: Secondary | ICD-10-CM

## 2020-04-08 ENCOUNTER — Encounter: Payer: Self-pay | Admitting: Internal Medicine

## 2020-04-08 ENCOUNTER — Ambulatory Visit: Payer: Medicare Other | Admitting: Internal Medicine

## 2020-04-08 ENCOUNTER — Other Ambulatory Visit: Payer: Self-pay

## 2020-04-08 DIAGNOSIS — J449 Chronic obstructive pulmonary disease, unspecified: Secondary | ICD-10-CM

## 2020-04-08 NOTE — Progress Notes (Signed)
Dana [name], female    DOB: [birth year]    MRN: [id number]    Brief patient profile:  46 yowf mother of primary care physician in [place] quit smoking 1979 with chronic doe prior  evals with nl spirometry as recently as 12/03/09   07/05/2016  Walked RA x 3 laps @ 185 ft each stopped due to  End of study, nl pace no desat or sob  - Spirometry 07/05/2016  wnl p am spiriva  - CTa chest 07/05/2016 neg pe/ neg ILD, R infrahilar density c/w lung ca vs post RT scar     Oncology note 11/17/18 : DIAGNOSIS: Stage IIIB (T2a, N3, M0) non-small cell lung cancer, adenocarcinoma with positive EGFR mutation diagnosed in January 2010.  PRIOR THERAPY: 1) Status post concurrent chemoradiation with weekly carboplatin and paclitaxel; last dose given March 21, 2009.  2) Tarceva at 150 mg p.o. daily, status post approximately 48 months of treatment, discontinued secondary to persistent diarrhea.  3) status post right Pleurx catheter placement for right nonmalignant pleural effusion. 4) R pleurex Ger[place] 2017 / sp pleurodesis  CURRENT THERAPY: Tarceva 100 mg by mouth daily started 03/19/2013, status post 68 months of treatment.     History of Present Illness  11/25/2018  Pulmonary consultation / Dr Blima Singer re sob   Chief Complaint  Patient presents with  . Pulmonary Consult    Referred by Theadore Nan. Pt c/o SOB x 10 yrs, worse x 3 months. She gets out of breath walking up a flight of stairs.   Dyspnea:  Indolent onset/ As of 3 months prior to [place] worse" to point where struggles step to step and stops at top of one flight  Still able to shop at [employer] / does not check sats  Cough: none Sleep: fine flat on one pillow SABA use: none  rec Goal is to keep your 02 saturation above 90% by walking slower or adjusting 02 flow to reach at least 90%      12/03/2018  f/u ov/Massai Hankerson re: sob Chief Complaint  Patient presents with  . Follow-up    She states breathing seems  better but she has not been as active. She was started on o2 per PCP- she only uses when she is at home during the day, none with sleep.   Dyspnea:  Has not tried steps yet but seems easier room to room 2lpm and  Typically maintaining > 90% when she checks it  Cough: none Sleeping: fine  SABA use: none 02: 2lpm prn daytime not at hs  rec Start anoro one click each am  - take two good deep drags then rinse and gargle  Adjust 02 saturations to keep it above 90%         06/22/2019  f/u ov/[name] re: COPD GOLD 0/worse doe on anoro with assoc uacs/ no longer using 02 Chief Complaint  Patient presents with  . Follow-up    Breathing has been worse since the last visit. She states she feels exhausted and is afraid to go out anywhere alone. She has prod cough with clear sputum.   Dyspnea:  MMRC3 = can't walk 100 yards even at a slow pace at a flat grade s stopping due to sob   Cough: throat clearing esp after anoro Sleeping: ok flat fine SABA use: none 02: none  rec Stop anoro and start stiolto 2 puffs each am in its place Prednisone 10 mg take  4 each am  x 2 days,   2 each am x 2 days,  1 each am x 2 days and stop  Work on inhaler technique:  relax and gently blow all the way out then take a nice smooth deep breath back in, triggering the inhaler at same time you start breathing in.  Hold for up to 5 seconds if you can.  . Rinse and gargle with water when done Stop boniva after this month Protonix 40 mg Take 30-60 min before first meal of the day daily  Please schedule a follow up office visit in 2 weeks, sooner if needed    07/10/2019  f/u ov/Terreon Ekholm re: COPD 0 rx stiolto / rash on protonix resolved off  Chief Complaint  Patient presents with  . Follow-up    Breathing is "great".  No new co's.  Dyspnea:  Better but still = MMRC2 = can't walk a nl pace on a flat grade s sob but does fine slow and flat  Cough: much better  Sleeping: flat fine one pillow  SABA use: none  02: none  Overt hb  off ppi  rec Continue stiolto 2 pffs daily - other option Bevespi 2 every 12 hours If getting worse >  Prednisone 10 mg take  4 each am x 2 days,   2 each am x 2 days,  1 each am x 2 days and stop   Pepcid 20 mg after bfast and supper    10/09/2019  f/u ov/[name] re: GOLD 0  stiolto Chief Complaint  Patient presents with  . Follow-up    Breathing is unchanged since the last visit.   Dyspnea:  Up and down ailes at [employer] for up to an hour Cough: no Sleeping: able to lie flat one pillow  SABA use: none 02: none  rec Continue pepcid (famotidine) 20 mg after bfast and supper Try just one puff stiolto each am top see if you can tell any difference with walking at [employer]   . 04/08/2020  f/u ov/[name] re: gold 0  On stiolto one puff daily / covid  second shot feb 5th moderna Chief Complaint  Patient presents with  . Follow-up    Breathing is "ok".  She states does well as long as she stays active. She does not have a rescue inhaler.    Dyspnea:  Still doing [employer] for up to an hour but does it immediately after stiolto (no more than an hour after) Cough: sometimes p walking minimal creamy Sleeping: flat one pillow SABA use: none  02: none     No obvious day to day or daytime variability or assoc excess/ purulent sputum or mucus plugs or hemoptysis or cp or chest tightness, subjective wheeze or overt sinus or hb symptoms.   Sleeping as aove  without nocturnal  or early am exacerbation  of respiratory  c/o's or need for noct saba. Also denies any obvious fluctuation of symptoms with weather or environmental changes or other aggravating or alleviating factors except as outlined above   No unusual exposure hx or h/o childhood pna/ asthma or knowledge of premature birth.  Current Allergies, Complete Past Medical History, Past Surgical History, Family History, and Social History were reviewed in [employer] record.  ROS  The following are not active complaints  unless bolded Hoarseness, sore throat, dysphagia, dental problems, itching, sneezing,  nasal congestion or discharge of excess mucus or purulent secretions, ear ache,   fever, chills, sweats, unintended wt loss or wt gain,  classically pleuritic or exertional cp,  orthopnea pnd or arm/hand swelling  or leg swelling R dependent, mild andresolves with elevation immeidately, presyncope, palpitations, abdominal pain, anorexia, nausea, vomiting, diarrhea  or change in bowel habits or change in bladder habits, change in stools or change in urine, dysuria, hematuria,  rash, arthralgias, visual complaints, headache, numbness, weakness or ataxia or problems with walking or coordination,  change in mood or  memory.        Current Meds  Medication Sig  . aspirin EC 81 MG tablet Take 81 mg by mouth daily.  Marland Kitchen augmented betamethasone dipropionate (DIPROLENE-AF) 0.05 % ointment Apply 1 application topically 2 (two) times daily as needed.  . bismuth subsalicylate (PEPTO BISMOL) 262 MG/15ML suspension Take 30 mLs by mouth every three (3) days as needed.  . Calcium Carbonate-Vitamin D 600-400 MG-UNIT per chew tablet Chew 1 tablet by mouth 2 (two) times daily.  . cholecalciferol (VITAMIN D) 1000 UNITS tablet Take 1,000 Units by mouth daily.   . clindamycin (CLEOCIN T) 1 % external solution APPLY  SOLUTION TOPICALLY TO AFFECTED AREA(S) TWICE DAILY  . Coenzyme Q10 (COQ10) 100 MG CAPS Take 1 capsule by mouth daily.   . colchicine 0.6 MG tablet Take 1 tablet (0.6 mg total) by mouth daily as needed (knee pain due to pseudogout).  . Cyanocobalamin (B-12) 1000 MCG CAPS Take 1,000 mcg by mouth 3 (three) times daily with meals.   . erlotinib (TARCEVA) 100 MG tablet Take 1 tablet (100 mg total) by mouth daily. Take on an empty stomach 1 hour before meals or 2 hours after  . loperamide (IMODIUM A-D) 2 MG tablet Take 1 mg by mouth 4 (four) times daily as needed for diarrhea or loose stools.   Marland Kitchen loratadine (CLARITIN) 10 MG tablet  Take 10 mg by mouth daily as needed for allergies.   Marland Kitchen LORazepam (ATIVAN) 0.5 MG tablet Take 1 tablet (0.5 mg total) by mouth every 8 (eight) hours as needed for anxiety.  . meloxicam (MOBIC) 15 MG tablet Take 15 mg by mouth daily.  . mirtazapine (REMERON) 15 MG tablet TAKE 1 TABLET BY MOUTH AT BEDTIME  . Polyethyl Glycol-Propyl Glycol (SYSTANE OP) Place 1 drop into both eyes at bedtime as needed (for dry eyes.).   Marland Kitchen psyllium (METAMUCIL) 58.6 % powder Take 1 packet by mouth daily as needed.  . Simethicone (GAS-X PO) Take 1 tablet by mouth 2 (two) times daily as needed (for gas).   . Tiotropium Bromide-Olodaterol (STIOLTO RESPIMAT) 2.5-2.5 MCG/ACT AERS Inhale 2 puffs into the lungs daily.                   Objective:     Thin wf nad   04/08/2020        111 10/09/2019      111  07/10/2019        115  06/22/2019        114  05/08/2019        115  12/03/2018      120  11/25/18 116 lb (52.6 kg)  11/17/18 117 lb 6.4 oz (53.3 kg)  09/16/18 117 lb 4.8 oz (53.2 kg)     Vital signs reviewed  04/08/2020  - Note at rest 02 sats  92% on RA   HEENT : pt wearing mask not removed for exam due to covid - 19 concerns.   NECK :  without JVD/Nodes/TM/ nl carotid upstrokes bilaterally   LUNGS: no acc muscle use,  Min  barrel  contour chest wall with bilateral  slightly decreased bs s audible wheeze and  without cough on insp or exp maneuvers and min  Hyperresonant  to  percussion bilaterally     CV:  RRR  no s3 or murmur or increase in P2, and no edema   ABD:  soft and nontender with pos end  insp Hoover's  in the supine position. No bruits or organomegaly appreciated, bowel sounds nl  MS:   Nl gait/  ext warm without deformities, calf tenderness, cyanosis or clubbing No obvious joint restrictions   SKIN: warm and dry without lesions    NEURO:  alert, approp, nl sensorium with  no motor or cerebellar deficits apparent.                   Assessment

## 2020-04-08 NOTE — Patient Instructions (Signed)
Ok to try stiolto 1-2 puffs at least an hour you try to walk at [employer]   Please schedule a follow up visit in 6  months but call sooner if needed

## 2020-04-09 ENCOUNTER — Encounter: Payer: Self-pay | Admitting: Internal Medicine

## 2020-04-09 NOTE — Assessment & Plan Note (Signed)
Quit smoking 1979 07/05/2016  Walked RA x 3 laps @ 185 ft each stopped due to  End of study, nl pace no desat or sob  - Spirometry 07/05/2016  wnl p am spiriva  - CTa chest 07/05/2016 neg pe/ neg ILD, R infrahilar density c/w lung ca vs post RT scar   Chest CT w contrast  09/12/18  Stable CT of the chest - 11/25/2018   Walked RA  2 laps @ 278ft each @ moderate pace  stopped due to  desats to 86% corrected on 2lpm  - HRCT  11/28/18 1. No findings to suggest interstitial lung disease. 2. Chronic postradiation changes in the right lung and evidence of prior right-sided pleurodesis redemonstrated, as above. - PFT's  12/03/2018  FEV1 1.53 (86 % ) ratio 80  p 18 % improvement from saba p nothing prior to study with DLCO  52/52 % corrects to 80 % for alv volume  With minimal curvature   - 06/22/2019   Walked RA  2 laps @  approx 266ft each @ slow to moderate pace  stopped due to  End of study, min sob, no desats   - 06/22/2019   > try stiolto instead of anoro x 2 weeks > better - 10/09/2019    try stiolto just one puff each am - CT 10/16/2019 Mild centrilobular and paraseptal emphysematous changes, upper lung Predominant. - 04/08/2020  After extensive coaching inhaler device,  effectiveness =    90% with smi > continue stiolto altenate 1 with 2 pffs at least an hour prior to daily ex to see if notes any difference vs 0 puffs   Technically Pt is Group B in terms of symptom/risk and laba/lama therefore appropriate rx at this point >>>  Continue lama/laba but since not sure it's helping ok to try 0 vs 1 vs 2 pffs daily as long as it's at least an hour prior to desired activities   >>> f/u q 6 m          Each maintenance medication was reviewed in detail including emphasizing most importantly the difference between maintenance and prns and under what circumstances the prns are to be triggered using an action plan format where appropriate.  Total time for H and P, chart review, counseling, teaching device and  generating customized AVS unique to this office visit / charting = 20 min

## 2020-04-22 ENCOUNTER — Other Ambulatory Visit: Payer: Self-pay

## 2020-04-22 ENCOUNTER — Inpatient Hospital Stay: Payer: Medicare Other | Attending: Internal Medicine

## 2020-04-22 ENCOUNTER — Ambulatory Visit (HOSPITAL_COMMUNITY)
Admission: RE | Admit: 2020-04-22 | Discharge: 2020-04-22 | Disposition: A | Discharge status: Home / Self Care | Payer: Medicare Other | Source: Ambulatory Visit | Attending: Internal Medicine | Admitting: Internal Medicine

## 2020-04-22 DIAGNOSIS — C342 Malignant neoplasm of middle lobe, bronchus or lung: Secondary | ICD-10-CM | POA: Diagnosis not present

## 2020-04-22 DIAGNOSIS — C3490 Malignant neoplasm of unspecified part of unspecified bronchus or lung: Secondary | ICD-10-CM | POA: Insufficient documentation

## 2020-04-22 LAB — CBC WITH DIFFERENTIAL (CANCER CENTER ONLY)
Abs Immature Granulocytes: 0.01 10*3/uL (ref 0.00–0.07)
Basophils Absolute: 0 10*3/uL (ref 0.0–0.1)
Basophils Relative: 0 %
Eosinophils Absolute: 0.2 10*3/uL (ref 0.0–0.5)
Eosinophils Relative: 5 %
HCT: 38 % (ref 36.0–46.0)
Hemoglobin: 12.5 g/dL (ref 12.0–15.0)
Immature Granulocytes: 0 %
Lymphocytes Relative: 13 %
Lymphs Abs: 0.7 10*3/uL (ref 0.7–4.0)
MCH: 31.8 pg (ref 26.0–34.0)
MCHC: 32.9 g/dL (ref 30.0–36.0)
MCV: 96.7 fL (ref 80.0–100.0)
Monocytes Absolute: 0.4 10*3/uL (ref 0.1–1.0)
Monocytes Relative: 8 %
Neutro Abs: 3.9 10*3/uL (ref 1.7–7.7)
Neutrophils Relative %: 74 %
Platelet Count: 263 10*3/uL (ref 150–400)
RBC: 3.93 MIL/uL (ref 3.87–5.11)
RDW: 13.3 % (ref 11.5–15.5)
WBC Count: 5.2 10*3/uL (ref 4.0–10.5)
nRBC: 0 % (ref 0.0–0.2)

## 2020-04-22 LAB — CMP (CANCER CENTER ONLY)
ALT: 21 U/L (ref 0–44)
AST: 29 U/L (ref 15–41)
Albumin: 3.4 g/dL — ABNORMAL LOW (ref 3.5–5.0)
Alkaline Phosphatase: 49 U/L (ref 38–126)
Anion gap: 9 (ref 5–15)
BUN: 19 mg/dL (ref 8–23)
CO2: 25 mmol/L (ref 22–32)
Calcium: 8.9 mg/dL (ref 8.9–10.3)
Chloride: 109 mmol/L (ref 98–111)
Creatinine: 0.93 mg/dL (ref 0.44–1.00)
GFR, Est AFR Am: >60 mL/min (ref >60)
GFR, Estimated: 57 mL/min — ABNORMAL LOW (ref >60)
Glucose, Bld: 113 mg/dL — ABNORMAL HIGH (ref 70–99)
Potassium: 3.8 mmol/L (ref 3.5–5.1)
Sodium: 143 mmol/L (ref 135–145)
Total Bilirubin: 1.2 mg/dL (ref 0.3–1.2)
Total Protein: 6.1 g/dL — ABNORMAL LOW (ref 6.5–8.1)

## 2020-04-22 MED ORDER — IOHEXOL 300 MG/ML  SOLN
75.0000 mL | Freq: Once | INTRAMUSCULAR | Status: AC | PRN
  Administered 2020-04-22: 13:00:00 75 mL via INTRAVENOUS

## 2020-04-22 MED ORDER — SODIUM CHLORIDE (PF) 0.9 % IJ SOLN
INTRAMUSCULAR | Status: AC
  Filled 2020-04-22: qty 50

## 2020-04-25 ENCOUNTER — Inpatient Hospital Stay: Payer: Medicare Other | Attending: Internal Medicine | Admitting: Internal Medicine

## 2020-04-25 ENCOUNTER — Telehealth: Payer: Self-pay | Admitting: Internal Medicine

## 2020-04-25 ENCOUNTER — Other Ambulatory Visit: Payer: Self-pay

## 2020-04-25 ENCOUNTER — Encounter: Payer: Self-pay | Admitting: Internal Medicine

## 2020-04-25 VITALS — BP 162/82 | HR 72 | Temp 98.2°F | Resp 20 | Ht 64.0 in | Wt 111.5 lb

## 2020-04-25 DIAGNOSIS — C3491 Malignant neoplasm of unspecified part of right bronchus or lung: Secondary | ICD-10-CM

## 2020-04-25 DIAGNOSIS — K760 Fatty (change of) liver, not elsewhere classified: Secondary | ICD-10-CM | POA: Insufficient documentation

## 2020-04-25 DIAGNOSIS — M810 Age-related osteoporosis without current pathological fracture: Secondary | ICD-10-CM | POA: Insufficient documentation

## 2020-04-25 DIAGNOSIS — Z5181 Encounter for therapeutic drug level monitoring: Secondary | ICD-10-CM | POA: Diagnosis not present

## 2020-04-25 DIAGNOSIS — R5383 Other fatigue: Secondary | ICD-10-CM | POA: Insufficient documentation

## 2020-04-25 DIAGNOSIS — K219 Gastro-esophageal reflux disease without esophagitis: Secondary | ICD-10-CM | POA: Diagnosis not present

## 2020-04-25 DIAGNOSIS — Z8719 Personal history of other diseases of the digestive system: Secondary | ICD-10-CM | POA: Diagnosis not present

## 2020-04-25 DIAGNOSIS — R21 Rash and other nonspecific skin eruption: Secondary | ICD-10-CM | POA: Insufficient documentation

## 2020-04-25 DIAGNOSIS — I2510 Atherosclerotic heart disease of native coronary artery without angina pectoris: Secondary | ICD-10-CM | POA: Insufficient documentation

## 2020-04-25 DIAGNOSIS — C342 Malignant neoplasm of middle lobe, bronchus or lung: Secondary | ICD-10-CM | POA: Insufficient documentation

## 2020-04-25 DIAGNOSIS — I10 Essential (primary) hypertension: Secondary | ICD-10-CM | POA: Diagnosis not present

## 2020-04-25 DIAGNOSIS — J90 Pleural effusion, not elsewhere classified: Secondary | ICD-10-CM | POA: Insufficient documentation

## 2020-04-25 DIAGNOSIS — Z79899 Other long term (current) drug therapy: Secondary | ICD-10-CM | POA: Insufficient documentation

## 2020-04-25 DIAGNOSIS — I700 Atherosclerosis of aorta: Secondary | ICD-10-CM | POA: Diagnosis not present

## 2020-04-25 DIAGNOSIS — Z7982 Long term (current) use of aspirin: Secondary | ICD-10-CM | POA: Diagnosis not present

## 2020-04-25 DIAGNOSIS — Z9221 Personal history of antineoplastic chemotherapy: Secondary | ICD-10-CM | POA: Insufficient documentation

## 2020-04-25 DIAGNOSIS — F329 Major depressive disorder, single episode, unspecified: Secondary | ICD-10-CM | POA: Diagnosis not present

## 2020-04-25 MED ORDER — ERLOTINIB HCL 100 MG PO TABS: 100.0000 mg | ORAL_TABLET | Freq: Every day | ORAL | 3 refills | Status: DC

## 2020-04-25 NOTE — Telephone Encounter (Signed)
Scheduled appt per 5/3 los - gave pt  AVS and calender

## 2020-04-25 NOTE — Progress Notes (Signed)
[facility] [phone]   [phone]  OFFICE PROGRESS NOTE  Dana Caraway, MD [place] [address]  DIAGNOSIS: Stage IIIB (T2a, N3, M0) non-small cell lung cancer, adenocarcinoma with positive EGFR mutation diagnosed in January 2010.  PRIOR THERAPY: 1) Status post concurrent chemoradiation with weekly carboplatin and paclitaxel; last dose given March 21, 2009.  2) Tarceva at 150 mg p.o. daily, status post approximately 48 months of treatment, discontinued secondary to persistent diarrhea.  3) status post right Pleurx catheter placement for right nonmalignant pleural effusion.  CURRENT THERAPY: Tarceva 100 mg by mouth daily started 03/19/2013, status post 85 months of treatment.  INTERVAL HISTORY: Dana [name] 85 y.o. female returns to the clinic today for 2 months follow-up visit.  The patient is feeling fine today with no concerning complaints except for increasing fatigue and lack of stamina.  She denied having any current chest pain, shortness of breath, cough or hemoptysis.  She denied having any fever or chills.  She has no nausea, vomiting, diarrhea or constipation.  She has no headache or visual changes.  She has been tolerating her treatment with Tarceva fairly well.  The patient had repeat CT scan of the chest performed recently and she is here for evaluation and discussion of her risk her results.  MEDICAL HISTORY: Past Medical History:  Diagnosis Date  . Arthritis    HANDS,  WRISTS  . COPD (chronic obstructive pulmonary disease) ([place])   . Depression 04/04/2017  . Dyspnea on exertion   . Encounter for therapeutic drug monitoring 10/01/2016  . GERD (gastroesophageal reflux disease)   . Hemorrhoid   . History of anal fissures   . History of lung cancer ONCOLOGIST--  DR [facility]--  LAST CT ,  NO RECURRENCE OR METS   DX JAN 2010 --  STAGE IIIA  NON-SMALL CELL ADENOCARCINOMA (RIGHT MIDDLE LOBE)---  S/P  CHEMORADIATIO  THERAPY  (COMPLETE 03-21-2009)  . HTN (hypertension) 10/10/2017  . Imbalance 06/04/2017  . lung ca dx'd 2010  . Normal cardiac stress test    2007  PER PT  . Osteoporosis   . Rash, skin    RIGHT FOREARM/ HAND  . Right wrist pain    MASS  . Wears glasses     ALLERGIES:  is allergic to amoxicillin and protonix [pantoprazole sodium].  MEDICATIONS:  Current Outpatient Medications  Medication Sig Dispense Refill  . aspirin EC 81 MG tablet Take 81 mg by mouth daily.    Marland Kitchen augmented betamethasone dipropionate (DIPROLENE-AF) 0.05 % ointment Apply 1 application topically 2 (two) times daily as needed.    . bismuth subsalicylate (PEPTO BISMOL) 262 MG/15ML suspension Take 30 mLs by mouth every three (3) days as needed.    . Calcium Carbonate-Vitamin D 600-400 MG-UNIT per chew tablet Chew 1 tablet by mouth 2 (two) times daily.    . cholecalciferol (VITAMIN D) 1000 UNITS tablet Take 1,000 Units by mouth daily.     . clindamycin (CLEOCIN T) 1 % external solution APPLY  SOLUTION TOPICALLY TO AFFECTED AREA(S) TWICE DAILY 60 mL 0  . Coenzyme Q10 (COQ10) 100 MG CAPS Take 1 capsule by mouth daily.     . colchicine 0.6 MG tablet Take 1 tablet (0.6 mg total) by mouth daily as needed (knee pain due to pseudogout). 30 tablet 3  . Cyanocobalamin (B-12) 1000 MCG CAPS Take 1,000 mcg by mouth 3 (three) times daily with meals.     Marland Kitchen  erlotinib (TARCEVA) 100 MG tablet Take 1 tablet (100 mg total) by mouth daily. Take on an empty stomach 1 hour before meals or 2 hours after 30 tablet 3  . loperamide (IMODIUM A-D) 2 MG tablet Take 1 mg by mouth 4 (four) times daily as needed for diarrhea or loose stools.     Marland Kitchen loratadine (CLARITIN) 10 MG tablet Take 10 mg by mouth daily as needed for allergies.     Marland Kitchen LORazepam (ATIVAN) 0.5 MG tablet Take 1 tablet (0.5 mg total) by mouth every 8 (eight) hours as needed for anxiety. 30 tablet 0  . meloxicam (MOBIC) 15 MG tablet Take 15 mg by mouth daily.  4  . mirtazapine (REMERON) 15  MG tablet TAKE 1 TABLET BY MOUTH AT BEDTIME 30 tablet 0  . Polyethyl Glycol-Propyl Glycol (SYSTANE OP) Place 1 drop into both eyes at bedtime as needed (for dry eyes.).     Marland Kitchen Simethicone (GAS-X PO) Take 1 tablet by mouth 2 (two) times daily as needed (for gas).     . Tiotropium Bromide-Olodaterol (STIOLTO RESPIMAT) 2.5-2.5 MCG/ACT AERS Inhale 2 puffs into the lungs daily. 4 g 11  . psyllium (METAMUCIL) 58.6 % powder Take 1 packet by mouth daily as needed.     No current facility-administered medications for this visit.    SURGICAL HISTORY:  Past Surgical History:  Procedure Laterality Date  . ANAL FISSURE REPAIR  07/09/2011   INTERNAL SPHINCTEROTOMY  . BENIGN EXCISION LEFT BREAST CENTRAL DUCT  02/1999  . BREAST BIOPSY  01/31/2009   benign  . BREAST EXCISIONAL BIOPSY Left 2004   benign  . CHEST TUBE INSERTION Right 07/14/2014   Procedure: INSERTION PLEURAL DRAINAGE CATHETER RIGHT CHEST;  Surgeon: Grace Isaac, MD;  Location: [facility];  Service: Thoracic;  Laterality: Right;  . EXCISION RIGHT WRIST MASS  2012  . EXTRACORPOREAL SHOCK WAVE LITHOTRIPSY Left 06/01/2019   Procedure: EXTRACORPOREAL SHOCK WAVE LITHOTRIPSY (ESWL);  Surgeon: Cleon Gustin, MD;  Location[facility];  Service: Urology;  Laterality: Left;  . KNEE ARTHROSCOPY Right 1999  . MASS EXCISION Right 03/11/2014   Procedure: RIGHT WRIST DEEP MASS EXCISION WITH CULTURE AND BIOSPY;  Surgeon: Linna Hoff, MD;  Location: [facility];  Service: Orthopedics;  Laterality: Right;  . REMOVAL OF PLEURAL DRAINAGE CATHETER Right 10/14/2014   Procedure: REMOVAL OF PLEURAL DRAINAGE CATHETER;  Surgeon: Grace Isaac, MD;  Location: [facility];  Service: Thoracic;  Laterality: Right;  . TALC PLEURODESIS Right 09/16/2014   Procedure: TALC PLEURADESIS/ slurry;  Surgeon: Grace Isaac, MD;  Location: [facility];  Service: Thoracic;  Laterality: Right;  . THORACOSCOPY  01-26-2009   w/   lung/  node biopsy's  . THUMB ARTHROSCOPY  Left    - removed bone spur  . TONSILLECTOMY  AS CHILD  . TOTAL ABDOMINAL HYSTERECTOMY W/ BILATERAL SALPINGOOPHORECTOMY  1982   W/  APPENDECTOMY  . TRANSTHORACIC ECHOCARDIOGRAM  12-23-2008   NORMAL LV/  EF 65-70%/  MILD MR  &  TR  . VAULT SUSPENSION PLUS CYSTOCELE REPAIR WITH GRAFT  06-13-2010    REVIEW OF SYSTEMS:  Constitutional: positive for fatigue Eyes: negative Ears, nose, mouth, throat, and face: negative Respiratory: negative Cardiovascular: negative Gastrointestinal: negative Genitourinary:negative Integument/breast: negative Hematologic/lymphatic: negative Musculoskeletal:negative Neurological: negative Behavioral/Psych: negative Endocrine: negative Allergic/Immunologic: negative   PHYSICAL EXAMINATION: General appearance: alert, cooperative and no distress Head: Normocephalic, without obvious abnormality, atraumatic Neck: no adenopathy, no JVD, supple, symmetrical, trachea midline and thyroid  not enlarged, symmetric, no tenderness/mass/nodules Lymph nodes: Cervical, supraclavicular, and axillary nodes normal. Resp: clear to auscultation bilaterally Back: symmetric, no curvature. ROM normal. No CVA tenderness. Cardio: regular rate and rhythm, S1, S2 normal, no murmur, click, rub or gallop GI: soft, non-tender; bowel sounds normal; no masses,  no organomegaly Extremities: extremities normal, atraumatic, no cyanosis or edema Neurologic: Alert and oriented X 3, normal strength and tone. Normal symmetric reflexes. Normal coordination and gait  ECOG PERFORMANCE STATUS: 1 - Symptomatic but completely ambulatory  Blood pressure (!) 162/82, pulse 72, temperature 98.2 F (36.8 C), temperature source Temporal, resp. rate 20, height '5\' 4"'$  (1.626 m), weight 111 lb 8 oz (50.6 kg), SpO2 100 %.  LABORATORY DATA: Lab Results  Component Value Date   WBC 5.2 04/22/2020   HGB 12.5 04/22/2020   HCT 38.0 04/22/2020   MCV 96.7 04/22/2020   PLT 263 04/22/2020      Chemistry       Component Value Date/Time   NA 143 04/22/2020 1125   NA 142 12/10/2017 1000   K 3.8 04/22/2020 1125   K 4.0 12/10/2017 1000   CL 109 04/22/2020 1125   CL 108 (H) 06/03/2013 0939   CO2 25 04/22/2020 1125   CO2 28 12/10/2017 1000   BUN 19 04/22/2020 1125   BUN 20.4 12/10/2017 1000   CREATININE 0.93 04/22/2020 1125   CREATININE 0.9 12/10/2017 1000      Component Value Date/Time   CALCIUM 8.9 04/22/2020 1125   CALCIUM 9.7 12/10/2017 1000   ALKPHOS 49 04/22/2020 1125   ALKPHOS 63 12/10/2017 1000   AST 29 04/22/2020 1125   AST 25 12/10/2017 1000   ALT 21 04/22/2020 1125   ALT 17 12/10/2017 1000   BILITOT 1.2 04/22/2020 1125   BILITOT 1.05 12/10/2017 1000       RADIOGRAPHIC STUDIES: CT Chest W Contrast  Result Date: 04/22/2020 CLINICAL DATA:  Non-small cell lung cancer, ongoing immunotherapy EXAM: CT CHEST WITH CONTRAST TECHNIQUE: Multidetector CT imaging of the chest was performed during intravenous contrast administration. CONTRAST:  49m OMNIPAQUE IOHEXOL 300 MG/ML  SOLN COMPARISON:  10/16/2019, 09/12/2018, 10/08/2017, 11/23/2016, 01/10/2015 FINDINGS: Cardiovascular: Aortic atherosclerosis. Normal heart size. Extensive 3 vessel coronary artery calcifications. No pericardial effusion. Mediastinum/Nodes: Unchanged soft tissue thickening about the right hilum and prominent mediastinal lymph nodes. Thyroid gland, trachea, and esophagus demonstrate no significant findings. Lungs/Pleura: Unchanged post treatment appearance of the right lung with extensive, bandlike paramedian and perihilar fibrotic consolidation and volume loss. Unchanged small, loculated right pleural effusion. Unchanged mixed solid and ground-glass nodule of the left pulmonary apex, measuring in total 1.6 x 1.2 cm, central solid component measuring 5 mm (series 5, image 15). Unchanged subpleural irregular nodule of the anterior left upper lobe measuring 9 mm (series 5, image 54). Upper Abdomen: No acute abnormality.   Hepatic steatosis. Musculoskeletal: No chest wall mass or suspicious bone lesions identified. IMPRESSION: 1. Unchanged post treatment appearance of the right lung with extensive, bandlike paramedian and perihilar fibrotic consolidation and volume loss. Unchanged small, loculated right pleural effusion. 2. Unchanged mixed solid and ground-glass nodule of the left pulmonary apex, measuring in total 1.6 x 1.2 cm, central solid component measuring 5 mm. Unchanged subpleural irregular nodule of the anterior left upper lobe measuring 9 mm. Although not significantly changed compared to immediate prior examination, these nodules have gradually increased in size over a long period of time dating back to at least 2016 and remain concerning for indolent adenocarcinoma. 3.  Coronary  artery disease.  Aortic Atherosclerosis (ICD10-I70.0). 4.  Hepatic steatosis. Electronically Signed   By: Eddie Candle M.D.   On: 04/22/2020 13:35    ASSESSMENT AND PLAN:  This is a very pleasant 85 years old white female with stage IIIB non-small cell lung cancer, adenocarcinoma with positive EGFR mutation diagnosed in January 2010. She has been on treatment with Tarceva for more than 9 years.   She is currently on Tarceva 100 mg p.o. daily status post 85  months. The patient has been tolerating her treatment with Tarceva fairly well with no significant adverse effects. She had repeat CT scan of the chest performed recently.  I personally and independently reviewed the scans and discussed the results with the patient today. Her scan showed no concerning findings for disease progression. I recommended for her to continue her current treatment with Tagrisso with the same dose. I will see her back for follow-up visit in 2 months for evaluation and repeat blood work. For the hypertension she is followed by her primary care physician.  She decided not to give her any new blood pressure medication because of her current fatigue and weakness  and she would like to continue to monitor her closely. The patient was advised to call immediately if she has any concerning symptoms in the interval. The patient voices understanding of current disease status and treatment options and is in agreement with the current care plan. All questions were answered. The patient knows to call the clinic with any problems, questions or concerns. We can certainly see the patient much sooner if necessary. Disclaimer: This note was dictated with voice recognition software. Similar sounding words can inadvertently be transcribed and may not be corrected upon review.

## 2020-04-28 ENCOUNTER — Other Ambulatory Visit: Payer: Self-pay | Admitting: Internal Medicine

## 2020-04-28 DIAGNOSIS — C3491 Malignant neoplasm of unspecified part of right bronchus or lung: Secondary | ICD-10-CM

## 2020-05-10 ENCOUNTER — Other Ambulatory Visit: Payer: Self-pay | Admitting: Medical Oncology

## 2020-05-10 DIAGNOSIS — C3491 Malignant neoplasm of unspecified part of right bronchus or lung: Secondary | ICD-10-CM

## 2020-05-10 MED ORDER — ERLOTINIB HCL 100 MG PO TABS: 100.0000 mg | ORAL_TABLET | Freq: Every day | ORAL | 3 refills | Status: DC

## 2020-05-10 NOTE — Progress Notes (Signed)
Faxed tarceva to [facility]

## 2020-05-30 ENCOUNTER — Other Ambulatory Visit: Payer: Self-pay | Admitting: Internal Medicine

## 2020-05-30 DIAGNOSIS — C3491 Malignant neoplasm of unspecified part of right bronchus or lung: Secondary | ICD-10-CM

## 2020-06-04 NOTE — Progress Notes (Signed)
CARDIOLOGY CONSULT NOTE       Patient ID: Dana [name] MRN: [id number] DOB/AGE: [birth year] 83 y.o.  Admit date: (Not on file) Referring Physician: Addison Lank Primary Physician: [name], MD Primary Cardiologist: New Reason for Consultation: Dsypnea/CAD  Active Problems:   * No active hospital problems. *   HPI:  85 y.o. referred by Dr Leonides Schanz for CAD seen on CT and dyspnea She has a history of COPD and lung cancer Seen by Dr Melvyn Novas 04/08/20 Quit smoking in 1979 Stage 3B non small cell lung cancer diagnosed 2010 Has had chemo radiation , Tarceva and right pleur X catheter in past She uses oxygen at home during day She has had Moderna vaccine for COVID  Walking studies show no desaturations  CT done 04/12/20 with extensive but unchanged volume loss , scarring and consolidation in right lung and 3 vessel coronary calcification that was also commented on CT done 10/16/19 and as far back as CT done 2016 Her son is a physician and wanted her to have her heart checked out as he felt her fatigue and dyspnea are worse   She has no chest pain. No documented vascular disease. No echo in Epic  Complains of chronic fatigue and exertional dyspnea Has not had stress test before ECG in 2015 was normal with low voltage   Lab review shows an LDL of 121   She has 2 sons One is primary care doctor in [place] Two run [place] in [place] is post CABG and also a patient of mine    ROS All other systems reviewed and negative except as noted above  Past Medical History:  Diagnosis Date  . Arthritis    HANDS,  WRISTS  . COPD (chronic obstructive pulmonary disease) ([place])   . Depression 04/04/2017  . Dyspnea on exertion   . Encounter for therapeutic drug monitoring 10/01/2016  . GERD (gastroesophageal reflux disease)   . Hemorrhoid   . History of anal fissures   . History of lung cancer ONCOLOGIST--  DR [facility]--  LAST CT ,  NO RECURRENCE OR METS   DX JAN 2010 --   STAGE IIIA  NON-SMALL CELL ADENOCARCINOMA (RIGHT MIDDLE LOBE)---  S/P  CHEMORADIATIO THERAPY  (COMPLETE 03-21-2009)  . HTN (hypertension) 10/10/2017  . Imbalance 06/04/2017  . lung ca dx'd 2010  . Normal cardiac stress test    2007  PER PT  . Osteoporosis   . Rash, skin    RIGHT FOREARM/ HAND  . Right wrist pain    MASS  . Wears glasses     Family History  Problem Relation Age of Onset  . Cancer Father        bladder  . Cancer Son        kidney - removed as a teenager    Social History   Socioeconomic History  . Marital status: Married    Spouse name: Not on file  . Number of children: Not on file  . Years of education: Not on file  . Highest education level: Not on file  Occupational History  . Not on file  Tobacco Use  . Smoking status: Former Smoker    Packs/day: 1.00    Years: 30.00    Pack years: 30.00    Types: Cigarettes    Quit date: 12/24/1977    Years since quitting: 42.4  . Smokeless tobacco: Never Used  Vaping Use  . Vaping Use: Never used  Substance and Sexual Activity  .  Alcohol use: No  . Drug use: No  . Sexual activity: Not on file  Other Topics Concern  . Not on file  Social History Narrative  . Not on file   Social Determinants of Health   Financial Resource Strain:   . Difficulty of Paying Living Expenses:   Food Insecurity:   . Worried About Charity fundraiser in the Last Year:   . Arboriculturist in the Last Year:   Transportation Needs:   . Film/video editor (Medical):   Marland Kitchen Lack of Transportation (Non-Medical):   Physical Activity:   . Days of Exercise per Week:   . Minutes of Exercise per Session:   Stress:   . Feeling of Stress :   Social Connections:   . Frequency of Communication with Friends and Family:   . Frequency of Social Gatherings with Friends and Family:   . Attends Religious Services:   . Active Member of Clubs or Organizations:   . Attends Archivist Meetings:   Marland Kitchen Marital Status:   Intimate  Partner Violence:   . Fear of Current or Ex-Partner:   . Emotionally Abused:   Marland Kitchen Physically Abused:   . Sexually Abused:     Past Surgical History:  Procedure Laterality Date  . ANAL FISSURE REPAIR  07/09/2011   INTERNAL SPHINCTEROTOMY  . BENIGN EXCISION LEFT BREAST CENTRAL DUCT  02/1999  . BREAST BIOPSY  01/31/2009   benign  . BREAST EXCISIONAL BIOPSY Left 2004   benign  . CHEST TUBE INSERTION Right 07/14/2014   Procedure: INSERTION PLEURAL DRAINAGE CATHETER RIGHT CHEST;  Surgeon: Grace Isaac, MD;  Location: [facility];  Service: Thoracic;  Laterality: Right;  . EXCISION RIGHT WRIST MASS  2012  . EXTRACORPOREAL SHOCK WAVE LITHOTRIPSY Left 06/01/2019   Procedure: EXTRACORPOREAL SHOCK WAVE LITHOTRIPSY (ESWL);  Surgeon: Cleon Gustin, MD;  [facility];  Service: Urology;  Laterality: Left;  . KNEE ARTHROSCOPY Right 1999  . MASS EXCISION Right 03/11/2014   Procedure: RIGHT WRIST DEEP MASS EXCISION WITH CULTURE AND BIOSPY;  Surgeon: Linna Hoff, MD;  Location: [facility];  Service: Orthopedics;  Laterality: Right;  . REMOVAL OF PLEURAL DRAINAGE CATHETER Right 10/14/2014   Procedure: REMOVAL OF PLEURAL DRAINAGE CATHETER;  Surgeon: Grace Isaac, MD;  Location: [facility];  Service: Thoracic;  Laterality: Right;  . TALC PLEURODESIS Right 09/16/2014   Procedure: TALC PLEURADESIS/ slurry;  Surgeon: Grace Isaac, MD;  Location: [facility];  Service: Thoracic;  Laterality: Right;  . THORACOSCOPY  01-26-2009   w/   lung/  node biopsy's  . THUMB ARTHROSCOPY Left    - removed bone spur  . TONSILLECTOMY  AS CHILD  . TOTAL ABDOMINAL HYSTERECTOMY W/ BILATERAL SALPINGOOPHORECTOMY  1982   W/  APPENDECTOMY  . TRANSTHORACIC ECHOCARDIOGRAM  12-23-2008   NORMAL LV/  EF 65-70%/  MILD MR  &  TR  . VAULT SUSPENSION PLUS CYSTOCELE REPAIR WITH GRAFT  06-13-2010      Current Outpatient Medications:  .  aspirin EC 81 MG tablet, Take 81 mg by mouth daily., Disp: , Rfl:  .   augmented betamethasone dipropionate (DIPROLENE-AF) 0.05 % ointment, Apply 1 application topically 2 (two) times daily as needed., Disp: , Rfl:  .  bismuth subsalicylate (PEPTO BISMOL) 262 MG/15ML suspension, Take 30 mLs by mouth every three (3) days as needed., Disp: , Rfl:  .  Calcium Carbonate-Vitamin D 600-400 MG-UNIT per chew tablet, Chew 1  tablet by mouth 2 (two) times daily., Disp: , Rfl:  .  cholecalciferol (VITAMIN D) 1000 UNITS tablet, Take 1,000 Units by mouth daily. , Disp: , Rfl:  .  clindamycin (CLEOCIN T) 1 % external solution, APPLY  SOLUTION TOPICALLY TO AFFECTED AREA(S) TWICE DAILY, Disp: 60 mL, Rfl: 0 .  Coenzyme Q10 (COQ10) 100 MG CAPS, Take 1 capsule by mouth daily. , Disp: , Rfl:  .  colchicine 0.6 MG tablet, Take 1 tablet (0.6 mg total) by mouth daily as needed (knee pain due to pseudogout)., Disp: 30 tablet, Rfl: 3 .  Cyanocobalamin (B-12) 1000 MCG CAPS, Take 1,000 mcg by mouth 3 (three) times daily with meals. , Disp: , Rfl:  .  erlotinib (TARCEVA) 100 MG tablet, Take 1 tablet (100 mg total) by mouth daily. Take on an empty stomach 1 hour before meals or 2 hours after, Disp: 30 tablet, Rfl: 3 .  loperamide (IMODIUM A-D) 2 MG tablet, Take 1 mg by mouth 4 (four) times daily as needed for diarrhea or loose stools. , Disp: , Rfl:  .  loratadine (CLARITIN) 10 MG tablet, Take 10 mg by mouth daily as needed for allergies. , Disp: , Rfl:  .  LORazepam (ATIVAN) 0.5 MG tablet, Take 1 tablet (0.5 mg total) by mouth every 8 (eight) hours as needed for anxiety., Disp: 30 tablet, Rfl: 0 .  meloxicam (MOBIC) 15 MG tablet, Take 15 mg by mouth daily., Disp: , Rfl: 4 .  mirtazapine (REMERON) 15 MG tablet, TAKE 1 TABLET BY MOUTH AT BEDTIME, Disp: 30 tablet, Rfl: 0 .  Polyethyl Glycol-Propyl Glycol (SYSTANE OP), Place 1 drop into both eyes at bedtime as needed (for dry eyes.). , Disp: , Rfl:  .  psyllium (METAMUCIL) 58.6 % powder, Take 1 packet by mouth daily as needed., Disp: , Rfl:  .   Simethicone (GAS-X PO), Take 1 tablet by mouth 2 (two) times daily as needed (for gas). , Disp: , Rfl:  .  Tiotropium Bromide-Olodaterol (STIOLTO RESPIMAT) 2.5-2.5 MCG/ACT AERS, Inhale 2 puffs into the lungs daily., Disp: 4 g, Rfl: 11    Physical Exam: There were no vitals taken for this visit.    Affect appropriate Elderly white female  HEENT: normal Neck supple with no adenopathy JVP normal no bruits no thyromegaly Lungs Decreased BS right side  Heart:  S1/S2 no murmur, no rub, gallop or click PMI normal Abdomen: benighn, BS positve, no tenderness, no AAA no bruit.  No HSM or HJR Distal pulses intact with no bruits No edema Neuro non-focal Skin warm and dry No muscular weakness   Labs:   Lab Results  Component Value Date   WBC 5.2 04/22/2020   HGB 12.5 04/22/2020   HCT 38.0 04/22/2020   MCV 96.7 04/22/2020   PLT 263 04/22/2020   No results for input(s): NA, K, CL, CO2, BUN, CREATININE, CALCIUM, PROT, BILITOT, ALKPHOS, ALT, AST, GLUCOSE in the last 168 hours.  Invalid input(s): LABALBU Lab Results  Component Value Date   CKTOTAL 81 12/23/2008   CKMB 1.8 12/23/2008   TROPONINI <0.30 07/18/2012    Lab Results  Component Value Date   CHOL (H) 12/23/2008    225        ATP III CLASSIFICATION:  <200     mg/dL   Desirable  200-239  mg/dL   Borderline High  >=240    mg/dL   High   Lab Results  Component Value Date   HDL 64 12/23/2008  Lab Results  Component Value Date   LDLCALC (H) 12/23/2008    142        Total Cholesterol/HDL:CHD Risk Coronary Heart Disease Risk Table                     Men   Women  1/2 Average Risk   3.4   3.3   Lab Results  Component Value Date   TRIG 94 12/23/2008   Lab Results  Component Value Date   CHOLHDL 3.5 12/23/2008   No results found for: LDLDIRECT    Radiology: No results found.  EKG: 2015 SR low voltage normal ST segments  06/13/20 SR rate 99 normal    ASSESSMENT AND PLAN:   1. CAD:  Subclinical seen on CT  scans as far back as 2016 f/u lexiscan myovue  2. Dyspnea: chronic more than likely from lung cancer, smoking history and residual parenchymal lung disease from radiation/scarring and previous effusion requiring Pleur X catheter Will check echo for LV/RV function and try to estimate PA pressures 3. Lung Cancer:  CT stable on Tarceva f/u with Dr [name] and Melvyn Novas Using oxygen during day and Stiolto inhaler  4. HLD:  LDL > 100 The case for statin in 85 yo without DM and no clinical vascular disease is weak. Given calcium noted in CAD discussed taking zocor M/W/F to get LDL closer to targe <70 with minimal possible side effects   Lexiscan Myovue Echo  Zocor 20 mg M/M/F  Labs in 3 months   F/U PRN if studies low risk/normal   Signed: Jenkins Rouge 06/13/2020, 8:16 AM

## 2020-06-13 ENCOUNTER — Other Ambulatory Visit: Payer: Self-pay

## 2020-06-13 ENCOUNTER — Encounter: Payer: Self-pay | Admitting: Cardiovascular Disease

## 2020-06-13 ENCOUNTER — Ambulatory Visit: Payer: Medicare Other | Admitting: Cardiovascular Disease

## 2020-06-13 ENCOUNTER — Other Ambulatory Visit: Payer: Self-pay | Admitting: Cardiovascular Disease

## 2020-06-13 VITALS — BP 122/82 | HR 99 | Ht 64.0 in | Wt 111.0 lb

## 2020-06-13 DIAGNOSIS — R079 Chest pain, unspecified: Secondary | ICD-10-CM | POA: Diagnosis not present

## 2020-06-13 DIAGNOSIS — R9389 Abnormal findings on diagnostic imaging of other specified body structures: Secondary | ICD-10-CM

## 2020-06-13 DIAGNOSIS — R0602 Shortness of breath: Secondary | ICD-10-CM

## 2020-06-13 MED ORDER — SIMVASTATIN 20 MG PO TABS: 20.0000 mg | ORAL_TABLET | ORAL | 3 refills | Status: DC

## 2020-06-13 NOTE — Telephone Encounter (Signed)
Pt's medication was sent to pt's pharmacy as requested. Confirmation received.  °

## 2020-06-13 NOTE — Patient Instructions (Addendum)
Medication Instructions:  Your physician has recommended you make the following change in your medication: 1. START Simvastatin (Zocor) 20 mg by mouth Monday, Wednesday, and Friday  *If you need a refill on your cardiac medications before your next appointment, please call your pharmacy*   Lab Work: Your physician recommends that you return for lab work in: 3 months fasting lipid and liver panel.  If you have labs (blood work) drawn today and your tests are completely normal, you will receive your results only by: Marland Kitchen MyChart Message (if you have MyChart) OR . A paper copy in the mail If you have any lab test that is abnormal or we need to change your treatment, we will call you to review the results.   Testing/Procedures: Your physician has requested that you have an echocardiogram. Echocardiography is a painless test that uses sound waves to create images of your heart. It provides your doctor with information about the size and shape of your heart and how well your heart's chambers and valves are working. This procedure takes approximately one hour. There are no restrictions for this procedure.  Your physician has requested that you have a lexiscan myoview. For further information please visit [email or web address]. Please follow instruction sheet, as given.  Follow-Up: At [facility], you and your health needs are our priority.  As part of our continuing mission to provide you with exceptional heart care, we have created designated Provider Care Teams.  These Care Teams include your primary Cardiologist (physician) and Advanced Practice Providers (APPs -  Physician Assistants and Nurse Practitioners) who all work together to provide you with the care you need, when you need it.  We recommend signing up for the patient portal called "MyChart".  Sign up information is provided on this After Visit Summary.  MyChart is used to connect with patients for Virtual Visits (Telemedicine).  Patients  are able to view lab/test results, encounter notes, upcoming appointments, etc.  Non-urgent messages can be sent to your provider as well.   To learn more about what you can do with MyChart, go to [email or web address].    Your next appointment:   As needed  The format for your next appointment:   In Person  Provider:   You may see Dr. Alvan Dame or one of the following Advanced Practice Providers on your designated Care Team:    Truitt Merle, NP  Cecilie Kicks, NP  Kathyrn Drown, NP

## 2020-06-13 NOTE — Telephone Encounter (Signed)
*  STAT* If patient is at the pharmacy, call can be transferred to refill team.   1. Which medications need to be refilled? (please list name of each medication and dose if known) simvastatin (ZOCOR) 20 MG tablet  2. Which pharmacy/location (including street and city if local pharmacy) is medication to be sent to? [facility] - [place], [place] - [address]  3. Do they need a 30 day or 90 day supply? [place]

## 2020-06-23 ENCOUNTER — Inpatient Hospital Stay: Payer: Medicare Other

## 2020-06-23 ENCOUNTER — Inpatient Hospital Stay: Payer: Medicare Other | Attending: Internal Medicine | Admitting: Internal Medicine

## 2020-06-23 ENCOUNTER — Other Ambulatory Visit: Payer: Self-pay

## 2020-06-23 ENCOUNTER — Telehealth: Payer: Self-pay | Admitting: Internal Medicine

## 2020-06-23 ENCOUNTER — Encounter: Payer: Self-pay | Admitting: Internal Medicine

## 2020-06-23 VITALS — BP 148/85 | HR 97 | Temp 99.0°F | Resp 19 | Ht 64.0 in | Wt 113.7 lb

## 2020-06-23 DIAGNOSIS — J449 Chronic obstructive pulmonary disease, unspecified: Secondary | ICD-10-CM | POA: Insufficient documentation

## 2020-06-23 DIAGNOSIS — Z79899 Other long term (current) drug therapy: Secondary | ICD-10-CM | POA: Diagnosis not present

## 2020-06-23 DIAGNOSIS — K649 Unspecified hemorrhoids: Secondary | ICD-10-CM | POA: Diagnosis not present

## 2020-06-23 DIAGNOSIS — Z7982 Long term (current) use of aspirin: Secondary | ICD-10-CM | POA: Diagnosis not present

## 2020-06-23 DIAGNOSIS — R21 Rash and other nonspecific skin eruption: Secondary | ICD-10-CM | POA: Insufficient documentation

## 2020-06-23 DIAGNOSIS — Z923 Personal history of irradiation: Secondary | ICD-10-CM | POA: Diagnosis not present

## 2020-06-23 DIAGNOSIS — I10 Essential (primary) hypertension: Secondary | ICD-10-CM | POA: Insufficient documentation

## 2020-06-23 DIAGNOSIS — K219 Gastro-esophageal reflux disease without esophagitis: Secondary | ICD-10-CM | POA: Insufficient documentation

## 2020-06-23 DIAGNOSIS — C3491 Malignant neoplasm of unspecified part of right bronchus or lung: Secondary | ICD-10-CM

## 2020-06-23 DIAGNOSIS — Z9221 Personal history of antineoplastic chemotherapy: Secondary | ICD-10-CM | POA: Diagnosis not present

## 2020-06-23 DIAGNOSIS — C342 Malignant neoplasm of middle lobe, bronchus or lung: Secondary | ICD-10-CM | POA: Insufficient documentation

## 2020-06-23 DIAGNOSIS — M810 Age-related osteoporosis without current pathological fracture: Secondary | ICD-10-CM | POA: Insufficient documentation

## 2020-06-23 DIAGNOSIS — F329 Major depressive disorder, single episode, unspecified: Secondary | ICD-10-CM | POA: Insufficient documentation

## 2020-06-23 DIAGNOSIS — M129 Arthropathy, unspecified: Secondary | ICD-10-CM | POA: Diagnosis not present

## 2020-06-23 LAB — CBC WITH DIFFERENTIAL (CANCER CENTER ONLY)
Abs Immature Granulocytes: 0.02 10*3/uL (ref 0.00–0.07)
Basophils Absolute: 0 10*3/uL (ref 0.0–0.1)
Basophils Relative: 0 %
Eosinophils Absolute: 0.3 10*3/uL (ref 0.0–0.5)
Eosinophils Relative: 5 %
HCT: 35.6 % — ABNORMAL LOW (ref 36.0–46.0)
Hemoglobin: 11.9 g/dL — ABNORMAL LOW (ref 12.0–15.0)
Immature Granulocytes: 0 %
Lymphocytes Relative: 10 %
Lymphs Abs: 0.6 10*3/uL — ABNORMAL LOW (ref 0.7–4.0)
MCH: 31.7 pg (ref 26.0–34.0)
MCHC: 33.4 g/dL (ref 30.0–36.0)
MCV: 94.9 fL (ref 80.0–100.0)
Monocytes Absolute: 0.6 10*3/uL (ref 0.1–1.0)
Monocytes Relative: 9 %
Neutro Abs: 4.8 10*3/uL (ref 1.7–7.7)
Neutrophils Relative %: 76 %
Platelet Count: 265 10*3/uL (ref 150–400)
RBC: 3.75 MIL/uL — ABNORMAL LOW (ref 3.87–5.11)
RDW: 14.4 % (ref 11.5–15.5)
WBC Count: 6.3 10*3/uL (ref 4.0–10.5)
nRBC: 0 % (ref 0.0–0.2)

## 2020-06-23 LAB — CMP (CANCER CENTER ONLY)
ALT: 23 U/L (ref 0–44)
AST: 30 U/L (ref 15–41)
Albumin: 3.4 g/dL — ABNORMAL LOW (ref 3.5–5.0)
Alkaline Phosphatase: 47 U/L (ref 38–126)
Anion gap: 7 (ref 5–15)
BUN: 20 mg/dL (ref 8–23)
CO2: 27 mmol/L (ref 22–32)
Calcium: 8.7 mg/dL — ABNORMAL LOW (ref 8.9–10.3)
Chloride: 108 mmol/L (ref 98–111)
Creatinine: 1.02 mg/dL — ABNORMAL HIGH (ref 0.44–1.00)
GFR, Est AFR Am: 59 mL/min — ABNORMAL LOW (ref >60)
GFR, Estimated: 51 mL/min — ABNORMAL LOW (ref >60)
Glucose, Bld: 92 mg/dL (ref 70–99)
Potassium: 4.3 mmol/L (ref 3.5–5.1)
Sodium: 142 mmol/L (ref 135–145)
Total Bilirubin: 0.8 mg/dL (ref 0.3–1.2)
Total Protein: 6.1 g/dL — ABNORMAL LOW (ref 6.5–8.1)

## 2020-06-23 NOTE — Telephone Encounter (Signed)
Scheduled per 7/1 los. Printed avs and calendar for pt.

## 2020-06-23 NOTE — Progress Notes (Signed)
[facility] [phone]   [phone]  OFFICE PROGRESS NOTE  [name], MD [place] [address]  DIAGNOSIS: Stage IIIB (T2a, N3, M0) non-small cell lung cancer, adenocarcinoma with positive EGFR mutation diagnosed in January 2010.  PRIOR THERAPY: 1) Status post concurrent chemoradiation with weekly carboplatin and paclitaxel; last dose given March 21, 2009.  2) Tarceva at 150 mg p.o. daily, status post approximately 48 months of treatment, discontinued secondary to persistent diarrhea.  3) status post right Pleurx catheter placement for right nonmalignant pleural effusion.  CURRENT THERAPY: Tarceva 100 mg by mouth daily started 03/19/2013, status post 87 months of treatment.  INTERVAL HISTORY: Dana [name] 85 y.o. female returns to the clinic today for follow-up visit.  The patient is feeling fine today with no concerning complaints except for occasional shortness of breath and mild cough especially in the morning.  She denied having any current chest pain or hemoptysis.  She denied having any recent weight loss or night sweats.  She has no nausea, vomiting, diarrhea or constipation.  She has no headache or visual changes.  She has been tolerating her treatment with Tarceva fairly well.  The patient is here today for evaluation and repeat blood work.  She is also scheduled to see cardiology soon for evaluation of her shortness of breath and fluctuating blood pressure.   MEDICAL HISTORY: Past Medical History:  Diagnosis Date   Arthritis    HANDS,  WRISTS   COPD (chronic obstructive pulmonary disease) ([place])    Depression 04/04/2017   Dyspnea on exertion    Encounter for therapeutic drug monitoring 10/01/2016   GERD (gastroesophageal reflux disease)    Hemorrhoid    History of anal fissures    History of lung cancer ONCOLOGIST--  DR [name]--  LAST CT ,  NO RECURRENCE OR METS   DX JAN 2010 --  STAGE IIIA   NON-SMALL CELL ADENOCARCINOMA (RIGHT MIDDLE LOBE)---  S/P  CHEMORADIATIO THERAPY  (COMPLETE 03-21-2009)   HTN (hypertension) 10/10/2017   Imbalance 06/04/2017   lung ca dx'd 2010   Normal cardiac stress test    2007  PER PT   Osteoporosis    Rash, skin    RIGHT FOREARM/ HAND   Right wrist pain    MASS   Wears glasses     ALLERGIES:  is allergic to amoxicillin and protonix [pantoprazole sodium].  MEDICATIONS:  Current Outpatient Medications  Medication Sig Dispense Refill   aspirin EC 81 MG tablet Take 81 mg by mouth daily.     augmented betamethasone dipropionate (DIPROLENE-AF) 0.05 % ointment Apply 1 application topically 2 (two) times daily as needed.     bismuth subsalicylate (PEPTO BISMOL) 262 MG/15ML suspension Take 30 mLs by mouth every three (3) days as needed.     Calcium Carbonate-Vitamin D 600-400 MG-UNIT per chew tablet Chew 1 tablet by mouth 2 (two) times daily.     cholecalciferol (VITAMIN D) 1000 UNITS tablet Take 1,000 Units by mouth daily.      clindamycin (CLEOCIN T) 1 % external solution APPLY  SOLUTION TOPICALLY TO AFFECTED AREA(S) TWICE DAILY 60 mL 0   Coenzyme Q10 (COQ10) 100 MG CAPS Take 1 capsule by mouth daily.      colchicine 0.6 MG tablet Take 1 tablet (0.6 mg total) by mouth daily as needed (knee pain due to pseudogout). 30 tablet 3   Cyanocobalamin (B-12) 1000 MCG CAPS Take 1,000 mcg by mouth 3 (  three) times daily with meals.      erlotinib (TARCEVA) 100 MG tablet Take 1 tablet (100 mg total) by mouth daily. Take on an empty stomach 1 hour before meals or 2 hours after 30 tablet 3   loperamide (IMODIUM A-D) 2 MG tablet Take 1 mg by mouth 4 (four) times daily as needed for diarrhea or loose stools.      loratadine (CLARITIN) 10 MG tablet Take 10 mg by mouth daily as needed for allergies.      LORazepam (ATIVAN) 0.5 MG tablet Take 1 tablet (0.5 mg total) by mouth every 8 (eight) hours as needed for anxiety. 30 tablet 0   meloxicam (MOBIC)  15 MG tablet Take 15 mg by mouth daily.  4   mirtazapine (REMERON) 15 MG tablet TAKE 1 TABLET BY MOUTH AT BEDTIME 30 tablet 0   pantoprazole (PROTONIX) 40 MG tablet Take 40 mg by mouth every morning.     Polyethyl Glycol-Propyl Glycol (SYSTANE OP) Place 1 drop into both eyes at bedtime as needed (for dry eyes.).      psyllium (METAMUCIL) 58.6 % powder Take 1 packet by mouth daily as needed.     Simethicone (GAS-X PO) Take 1 tablet by mouth 2 (two) times daily as needed (for gas).      simvastatin (ZOCOR) 20 MG tablet Take 1 tablet (20 mg total) by mouth every Monday, Wednesday, and Friday. 39 tablet 3   Tiotropium Bromide-Olodaterol (STIOLTO RESPIMAT) 2.5-2.5 MCG/ACT AERS Inhale 2 puffs into the lungs daily. 4 g 11   No current facility-administered medications for this visit.    SURGICAL HISTORY:  Past Surgical History:  Procedure Laterality Date   ANAL FISSURE REPAIR  07/09/2011   INTERNAL SPHINCTEROTOMY   BENIGN EXCISION LEFT BREAST CENTRAL DUCT  02/1999   BREAST BIOPSY  01/31/2009   benign   BREAST EXCISIONAL BIOPSY Left 2004   benign   CHEST TUBE INSERTION Right 07/14/2014   Procedure: INSERTION PLEURAL DRAINAGE CATHETER RIGHT CHEST;  Surgeon: Grace Isaac, MD;  Location: [facility];  Service: Thoracic;  Laterality: Right;   EXCISION RIGHT WRIST MASS  2012   EXTRACORPOREAL SHOCK WAVE LITHOTRIPSY Left 06/01/2019   Procedure: EXTRACORPOREAL SHOCK WAVE LITHOTRIPSY (ESWL);  Surgeon: Cleon Gustin, MD;  Location: [facility];  Service: Urology;  Laterality: Left;   KNEE ARTHROSCOPY Right 1999   MASS EXCISION Right 03/11/2014   Procedure: RIGHT WRIST DEEP MASS EXCISION WITH CULTURE AND BIOSPY;  Surgeon: Linna Hoff, MD;  Location: [facility];  Service: Orthopedics;  Laterality: Right;   REMOVAL OF PLEURAL DRAINAGE CATHETER Right 10/14/2014   Procedure: REMOVAL OF PLEURAL DRAINAGE CATHETER;  Surgeon: Grace Isaac, MD;  Location: [facility];  Service:  Thoracic;  Laterality: Right;   TALC PLEURODESIS Right 09/16/2014   Procedure: TALC PLEURADESIS/ slurry;  Surgeon: Grace Isaac, MD;  Location: [facility];  Service: Thoracic;  Laterality: Right;   THORACOSCOPY  01-26-2009   w/   lung/  node biopsy's   THUMB ARTHROSCOPY Left    - removed bone spur   TONSILLECTOMY  AS CHILD   TOTAL ABDOMINAL HYSTERECTOMY W/ BILATERAL SALPINGOOPHORECTOMY  1982   W/  APPENDECTOMY   TRANSTHORACIC ECHOCARDIOGRAM  12-23-2008   NORMAL LV/  EF 65-70%/  MILD MR  &  TR   VAULT SUSPENSION PLUS CYSTOCELE REPAIR WITH GRAFT  06-13-2010    REVIEW OF SYSTEMS:  A comprehensive review of systems was negative except for: Constitutional: positive for  fatigue   PHYSICAL EXAMINATION: General appearance: alert, cooperative, fatigued and no distress Head: Normocephalic, without obvious abnormality, atraumatic Neck: no adenopathy, no JVD, supple, symmetrical, trachea midline and thyroid not enlarged, symmetric, no tenderness/mass/nodules Lymph nodes: Cervical, supraclavicular, and axillary nodes normal. Resp: clear to auscultation bilaterally Back: symmetric, no curvature. ROM normal. No CVA tenderness. Cardio: regular rate and rhythm, S1, S2 normal, no murmur, click, rub or gallop GI: soft, non-tender; bowel sounds normal; no masses,  no organomegaly Extremities: extremities normal, atraumatic, no cyanosis or edema  ECOG PERFORMANCE STATUS: 1 - Symptomatic but completely ambulatory  Blood pressure (!) 148/85, pulse 97, temperature 99 F (37.2 C), temperature source Temporal, resp. rate 19, height '5\' 4"'$  (1.626 m), weight 113 lb 11.2 oz (51.6 kg), SpO2 98 %.  LABORATORY DATA: Lab Results  Component Value Date   WBC 6.3 06/23/2020   HGB 11.9 (L) 06/23/2020   HCT 35.6 (L) 06/23/2020   MCV 94.9 06/23/2020   PLT 265 06/23/2020      Chemistry      Component Value Date/Time   NA 143 04/22/2020 1125   NA 142 12/10/2017 1000   K 3.8 04/22/2020 1125   K 4.0  12/10/2017 1000   CL 109 04/22/2020 1125   CL 108 (H) 06/03/2013 0939   CO2 25 04/22/2020 1125   CO2 28 12/10/2017 1000   BUN 19 04/22/2020 1125   BUN 20.4 12/10/2017 1000   CREATININE 0.93 04/22/2020 1125   CREATININE 0.9 12/10/2017 1000      Component Value Date/Time   CALCIUM 8.9 04/22/2020 1125   CALCIUM 9.7 12/10/2017 1000   ALKPHOS 49 04/22/2020 1125   ALKPHOS 63 12/10/2017 1000   AST 29 04/22/2020 1125   AST 25 12/10/2017 1000   ALT 21 04/22/2020 1125   ALT 17 12/10/2017 1000   BILITOT 1.2 04/22/2020 1125   BILITOT 1.05 12/10/2017 1000       RADIOGRAPHIC STUDIES: No results found.  ASSESSMENT AND PLAN:  This is a very pleasant 85 years old white female with stage IIIB non-small cell lung cancer, adenocarcinoma with positive EGFR mutation diagnosed in January 2010. She has been on treatment with Tarceva for more than 9 years.   She is currently on Tarceva 100 mg p.o. daily status post 87  months. The patient has been tolerating this treatment well with no concerning adverse effects. I recommended for her to continue her current treatment with Tarceva with the same dose. She will come back for follow-up visit in 2 months for evaluation and repeat blood work. For the fluctuation in her blood pressure as well as shortness of breath, she has an appointment with cardiology for evaluation of her condition and expected to have echocardiogram. The patient was advised to call immediately if she has any concerning symptoms in the interval. The patient voices understanding of current disease status and treatment options and is in agreement with the current care plan. All questions were answered. The patient knows to call the clinic with any problems, questions or concerns. We can certainly see the patient much sooner if necessary. Disclaimer: This note was dictated with voice recognition software. Similar sounding words can inadvertently be transcribed and may not be corrected upon  review.

## 2020-06-30 ENCOUNTER — Other Ambulatory Visit: Payer: Self-pay | Admitting: Internal Medicine

## 2020-06-30 ENCOUNTER — Telehealth (HOSPITAL_COMMUNITY): Payer: Self-pay

## 2020-06-30 DIAGNOSIS — C3491 Malignant neoplasm of unspecified part of right bronchus or lung: Secondary | ICD-10-CM

## 2020-06-30 NOTE — Telephone Encounter (Signed)
Spoke with the patient, detailed instructions were given. She stated that she understood and would be here for her test. Asked to call back with any questions. S.Revella Shelton EMTP

## 2020-07-05 ENCOUNTER — Other Ambulatory Visit: Payer: Self-pay

## 2020-07-05 ENCOUNTER — Ambulatory Visit (HOSPITAL_BASED_OUTPATIENT_CLINIC_OR_DEPARTMENT_OTHER): Payer: Medicare Other

## 2020-07-05 ENCOUNTER — Ambulatory Visit (HOSPITAL_COMMUNITY): Payer: Medicare Other | Attending: Cardiology

## 2020-07-05 DIAGNOSIS — R9389 Abnormal findings on diagnostic imaging of other specified body structures: Secondary | ICD-10-CM | POA: Diagnosis present

## 2020-07-05 DIAGNOSIS — R079 Chest pain, unspecified: Secondary | ICD-10-CM | POA: Diagnosis present

## 2020-07-05 DIAGNOSIS — R0602 Shortness of breath: Secondary | ICD-10-CM | POA: Diagnosis present

## 2020-07-05 LAB — MYOCARDIAL PERFUSION IMAGING
LV dias vol: 32 mL (ref 46–106)
LV sys vol: 8 mL
Peak HR: 101 {beats}/min
Rest HR: 85 {beats}/min
SDS: 4
SRS: 0
SSS: 4
TID: 0.86

## 2020-07-05 LAB — ECHOCARDIOGRAM COMPLETE
Height: 64 in
Weight: 1776 oz

## 2020-07-05 MED ORDER — TECHNETIUM TC 99M TETROFOSMIN IV KIT
31.4000 | PACK | Freq: Once | INTRAVENOUS | Status: AC | PRN
  Administered 2020-07-05: 31.4 via INTRAVENOUS
  Filled 2020-07-05: qty 32

## 2020-07-05 MED ORDER — REGADENOSON 0.4 MG/5ML IV SOLN
0.4000 mg | Freq: Once | INTRAVENOUS | Status: AC
  Administered 2020-07-05: 0.4 mg via INTRAVENOUS

## 2020-07-05 MED ORDER — TECHNETIUM TC 99M TETROFOSMIN IV KIT
9.8000 | PACK | Freq: Once | INTRAVENOUS | Status: AC | PRN
  Administered 2020-07-05: 9.8 via INTRAVENOUS
  Filled 2020-07-05: qty 10

## 2020-07-06 ENCOUNTER — Other Ambulatory Visit: Payer: Self-pay | Admitting: Family Medicine

## 2020-07-06 DIAGNOSIS — Z1231 Encounter for screening mammogram for malignant neoplasm of breast: Secondary | ICD-10-CM

## 2020-07-11 ENCOUNTER — Telehealth: Payer: Self-pay | Admitting: Cardiovascular Disease

## 2020-07-11 NOTE — Telephone Encounter (Signed)
Mailed copy of both test.

## 2020-07-11 NOTE — Telephone Encounter (Signed)
    Pt would like a copy of her stress test and her echo send to her mailing address. Verified address on file.

## 2020-08-02 ENCOUNTER — Ambulatory Visit (INDEPENDENT_AMBULATORY_CARE_PROVIDER_SITE_OTHER): Payer: Medicare Other

## 2020-08-02 ENCOUNTER — Ambulatory Visit (INDEPENDENT_AMBULATORY_CARE_PROVIDER_SITE_OTHER): Payer: Medicare Other | Admitting: Family Medicine

## 2020-08-02 ENCOUNTER — Other Ambulatory Visit: Payer: Self-pay | Admitting: Internal Medicine

## 2020-08-02 ENCOUNTER — Encounter: Payer: Self-pay | Admitting: Family Medicine

## 2020-08-02 ENCOUNTER — Other Ambulatory Visit: Payer: Self-pay

## 2020-08-02 VITALS — BP 150/90 | HR 95 | Ht 64.0 in | Wt 112.0 lb

## 2020-08-02 DIAGNOSIS — M25561 Pain in right knee: Secondary | ICD-10-CM | POA: Diagnosis not present

## 2020-08-02 DIAGNOSIS — C3491 Malignant neoplasm of unspecified part of right bronchus or lung: Secondary | ICD-10-CM

## 2020-08-02 NOTE — Patient Instructions (Signed)
Thank you for coming in today. Continue activity as tolerated.  Continue voltaren gel as needed and compression sleeve.  I am here for you as needed.

## 2020-08-02 NOTE — Progress Notes (Signed)
   Rito Ehrlich, am serving as a Education administrator for Dr. Lynne Leader.  Dana [name] is a 85 y.o. female who presents to [facility] today for f/u of chronic R knee pain.  She was last seen by Dr. Georgina Snell on 12/15/19 and had a R knee injection and was provided w/ a HEP focusing on quad and hip aBd strengthening.  She has a hx of R knee arthroscopic surgery 20 years ago.  Since her last visit, pt reports that the injections has helped since December. Uses the Voltaren which helps but for a quick amount of time, also wearing a sleeve. Pain is still in the medial knee and uses a cane to help walk because sometimes it feels like it will buckle.   Diagnostic testing: R knee XR- 12/21/19  Pertinent review of systems: No fevers or chills  Relevant historical information: COPD, hypertension history lung cancer 2012   Exam:  BP (!) 150/90 (BP Location: Left Arm, Patient Position: Sitting, Cuff Size: Normal)   Pulse 95   Ht 5\' 4"  (1.626 m)   Wt 112 lb (50.8 kg)   SpO2 96%   BMI 19.22 kg/m  General: Well Developed, well nourished, and in no acute distress.   MSK: Right knee small effusion otherwise normal-appearing Range of motion full with crepitation. Tender palpation at medial joint line Stable ligamentous exam.    Lab and Radiology Results  Procedure: Real-time Ultrasound Guided Injection of right knee superior patellar space laterally Device: Philips Affiniti 50G Images permanently stored and available for review in the ultrasound unit. Verbal informed consent obtained.  Discussed risks and benefits of procedure. Warned about infection bleeding damage to structures skin hypopigmentation and fat atrophy among others. Patient expresses understanding and agreement Time-out conducted.   Noted no overlying erythema, induration, or other signs of local infection.   Skin prepped in a sterile fashion.   Local anesthesia: Topical Ethyl chloride.   With sterile  technique and under real time ultrasound guidance:  40 mg of Kenalog and 2 mL of Marcaine injected easily.   Completed without difficulty   Pain immediately resolved suggesting accurate placement of the medication.   Advised to call if fevers/chills, erythema, induration, drainage, or persistent bleeding.   Images permanently stored and available for review in the ultrasound unit.  Impression: Technically successful ultrasound guided injection.     Assessment and Plan: 85 y.o. female with right knee pain due to DJD.  Patient had excellent response to injection back in December.  Plan for repeat steroid injection today.  Also recommend continued Voltaren gel compressive sleeve.  Also recommend quadriceps strengthening.    Orders Placed This Encounter  Procedures  . [place] LIMITED JOINT SPACE STRUCTURES LOW RIGHT    Standing Status:   Future    Number of Occurrences:   1    Standing Expiration Date:   08/02/2021    Order Specific Question:   Reason for Exam (SYMPTOM  OR DIAGNOSIS REQUIRED)    Answer:   Right knee pain    Order Specific Question:   Preferred imaging location?    Answer:   [facility]   No orders of the defined types were placed in this encounter.    Discussed warning signs or symptoms. Please see discharge instructions. Patient expresses understanding.   The above documentation has been reviewed and is accurate and complete Lynne Leader, M.D.

## 2020-08-08 ENCOUNTER — Ambulatory Visit
Admission: RE | Admit: 2020-08-08 | Discharge: 2020-08-08 | Disposition: A | Discharge status: Home / Self Care | Payer: Medicare Other | Source: Ambulatory Visit | Attending: Family Medicine | Admitting: Family Medicine

## 2020-08-08 ENCOUNTER — Other Ambulatory Visit: Payer: Self-pay

## 2020-08-08 DIAGNOSIS — Z1231 Encounter for screening mammogram for malignant neoplasm of breast: Secondary | ICD-10-CM

## 2020-08-09 ENCOUNTER — Telehealth: Payer: Self-pay | Admitting: *Deleted

## 2020-08-09 NOTE — Telephone Encounter (Signed)
Pt called requesting labs be faxed to rheumatologist, Levada Schilling, to prescribe Meloxicam. Labs were faxed to [phone]. Confirmation fax returned.

## 2020-08-12 ENCOUNTER — Telehealth: Payer: Self-pay | Admitting: Cardiovascular Disease

## 2020-08-12 ENCOUNTER — Telehealth: Payer: Self-pay | Admitting: *Deleted

## 2020-08-12 NOTE — Telephone Encounter (Signed)
Left a message at Dr. Worthy Flank office about getting fasting lipid and liver panel when patient is there for her office visit on 08/24/20. Will see if they can use current orders that are in Epic or see if they need them to be ordered differently.

## 2020-08-12 NOTE — Telephone Encounter (Signed)
[place] is calling requesting a lipid and liver panel order be faxed over to Dr. Worthy Flank office so she can have it done during her appt on 08/24/20. The fax # is:[phone]. Please advise.

## 2020-08-12 NOTE — Telephone Encounter (Signed)
Received call from pt asking about getting labs, lipid panel & liver profile that Dr Johnsie Cancel requested with her appt 08/24/20.  Spoke with lab & even though labs are in Epic she will need to have Dr Johnsie Cancel send fax note with order & OK to be done here & sent to [facility].  She expressed understanding. Fax # given for Dr Mohamed/Pod.

## 2020-08-12 NOTE — Telephone Encounter (Signed)
Dr. Worthy Flank office called back and stated they needs a prescription faxed over in order to have lab work done at their office. Will send prescription to fax number provided.

## 2020-08-24 ENCOUNTER — Other Ambulatory Visit: Payer: Self-pay

## 2020-08-24 ENCOUNTER — Inpatient Hospital Stay: Payer: Medicare Other

## 2020-08-24 ENCOUNTER — Encounter: Payer: Self-pay | Admitting: Internal Medicine

## 2020-08-24 ENCOUNTER — Inpatient Hospital Stay: Payer: Medicare Other | Attending: Internal Medicine | Admitting: Internal Medicine

## 2020-08-24 ENCOUNTER — Telehealth: Payer: Self-pay | Admitting: Internal Medicine

## 2020-08-24 VITALS — BP 172/89 | HR 68 | Temp 97.8°F | Resp 18 | Ht 64.0 in | Wt 109.5 lb

## 2020-08-24 DIAGNOSIS — Z923 Personal history of irradiation: Secondary | ICD-10-CM | POA: Insufficient documentation

## 2020-08-24 DIAGNOSIS — J449 Chronic obstructive pulmonary disease, unspecified: Secondary | ICD-10-CM | POA: Diagnosis not present

## 2020-08-24 DIAGNOSIS — M810 Age-related osteoporosis without current pathological fracture: Secondary | ICD-10-CM | POA: Diagnosis not present

## 2020-08-24 DIAGNOSIS — C3491 Malignant neoplasm of unspecified part of right bronchus or lung: Secondary | ICD-10-CM

## 2020-08-24 DIAGNOSIS — M129 Arthropathy, unspecified: Secondary | ICD-10-CM | POA: Insufficient documentation

## 2020-08-24 DIAGNOSIS — J90 Pleural effusion, not elsewhere classified: Secondary | ICD-10-CM | POA: Diagnosis not present

## 2020-08-24 DIAGNOSIS — K219 Gastro-esophageal reflux disease without esophagitis: Secondary | ICD-10-CM | POA: Diagnosis not present

## 2020-08-24 DIAGNOSIS — R21 Rash and other nonspecific skin eruption: Secondary | ICD-10-CM | POA: Insufficient documentation

## 2020-08-24 DIAGNOSIS — Z9221 Personal history of antineoplastic chemotherapy: Secondary | ICD-10-CM | POA: Diagnosis not present

## 2020-08-24 DIAGNOSIS — C342 Malignant neoplasm of middle lobe, bronchus or lung: Secondary | ICD-10-CM | POA: Diagnosis present

## 2020-08-24 DIAGNOSIS — Z79899 Other long term (current) drug therapy: Secondary | ICD-10-CM | POA: Diagnosis not present

## 2020-08-24 DIAGNOSIS — F329 Major depressive disorder, single episode, unspecified: Secondary | ICD-10-CM | POA: Insufficient documentation

## 2020-08-24 DIAGNOSIS — C3490 Malignant neoplasm of unspecified part of unspecified bronchus or lung: Secondary | ICD-10-CM

## 2020-08-24 DIAGNOSIS — Z7982 Long term (current) use of aspirin: Secondary | ICD-10-CM | POA: Diagnosis not present

## 2020-08-24 DIAGNOSIS — I10 Essential (primary) hypertension: Secondary | ICD-10-CM | POA: Insufficient documentation

## 2020-08-24 DIAGNOSIS — Z5181 Encounter for therapeutic drug level monitoring: Secondary | ICD-10-CM | POA: Diagnosis not present

## 2020-08-24 LAB — CBC WITH DIFFERENTIAL (CANCER CENTER ONLY)
Abs Immature Granulocytes: 0.01 10*3/uL (ref 0.00–0.07)
Basophils Absolute: 0 10*3/uL (ref 0.0–0.1)
Basophils Relative: 0 %
Eosinophils Absolute: 0.2 10*3/uL (ref 0.0–0.5)
Eosinophils Relative: 3 %
HCT: 37.1 % (ref 36.0–46.0)
Hemoglobin: 12.6 g/dL (ref 12.0–15.0)
Immature Granulocytes: 0 %
Lymphocytes Relative: 15 %
Lymphs Abs: 0.8 10*3/uL (ref 0.7–4.0)
MCH: 32.1 pg (ref 26.0–34.0)
MCHC: 34 g/dL (ref 30.0–36.0)
MCV: 94.4 fL (ref 80.0–100.0)
Monocytes Absolute: 0.5 10*3/uL (ref 0.1–1.0)
Monocytes Relative: 9 %
Neutro Abs: 4 10*3/uL (ref 1.7–7.7)
Neutrophils Relative %: 73 %
Platelet Count: 253 10*3/uL (ref 150–400)
RBC: 3.93 MIL/uL (ref 3.87–5.11)
RDW: 14.6 % (ref 11.5–15.5)
WBC Count: 5.5 10*3/uL (ref 4.0–10.5)
nRBC: 0 % (ref 0.0–0.2)

## 2020-08-24 LAB — CMP (CANCER CENTER ONLY)
ALT: 27 U/L (ref 0–44)
AST: 28 U/L (ref 15–41)
Albumin: 3.6 g/dL (ref 3.5–5.0)
Alkaline Phosphatase: 41 U/L (ref 38–126)
Anion gap: 6 (ref 5–15)
BUN: 17 mg/dL (ref 8–23)
CO2: 29 mmol/L (ref 22–32)
Calcium: 9.4 mg/dL (ref 8.9–10.3)
Chloride: 108 mmol/L (ref 98–111)
Creatinine: 0.95 mg/dL (ref 0.44–1.00)
GFR, Est AFR Am: >60 mL/min (ref >60)
GFR, Estimated: 55 mL/min — ABNORMAL LOW (ref >60)
Glucose, Bld: 90 mg/dL (ref 70–99)
Potassium: 3.9 mmol/L (ref 3.5–5.1)
Sodium: 143 mmol/L (ref 135–145)
Total Bilirubin: 1.3 mg/dL — ABNORMAL HIGH (ref 0.3–1.2)
Total Protein: 6.4 g/dL — ABNORMAL LOW (ref 6.5–8.1)

## 2020-08-24 NOTE — Progress Notes (Signed)
[facility] [phone]   [phone]  OFFICE PROGRESS NOTE  [name], MD [place] [address]  DIAGNOSIS: Stage IIIB (T2a, N3, M0) non-small cell lung cancer, adenocarcinoma with positive EGFR mutation diagnosed in January 2010.  PRIOR THERAPY: 1) Status post concurrent chemoradiation with weekly carboplatin and paclitaxel; last dose given March 21, 2009.  2) Tarceva at 150 mg p.o. daily, status post approximately 48 months of treatment, discontinued secondary to persistent diarrhea.  3) status post right Pleurx catheter placement for right nonmalignant pleural effusion.  CURRENT THERAPY: Tarceva 100 mg by mouth daily started 03/19/2013, status post 89 months of treatment.  INTERVAL HISTORY: Dana [name] 85 y.o. female returns to the clinic today for follow-up visit.  The patient is feeling fine today with no concerning complaints except for fatigue and intermittent shortness of breath.  She was evaluated by pulmonary medicine as well as cardiology with no clear etiology.  She denied having any chest pain, cough or hemoptysis.  She denied having any fever or chills.  She has no nausea, vomiting, diarrhea or constipation.  She denied having any headache or visual changes.  The patient continues to tolerate her treatment with Tarceva fairly well.  She is here today for evaluation and repeat blood work.  MEDICAL HISTORY: Past Medical History:  Diagnosis Date   Arthritis    HANDS,  WRISTS   COPD (chronic obstructive pulmonary disease) ([place])    Depression 04/04/2017   Dyspnea on exertion    Encounter for therapeutic drug monitoring 10/01/2016   GERD (gastroesophageal reflux disease)    Hemorrhoid    History of anal fissures    History of lung cancer ONCOLOGIST--  DR [name]--  LAST CT ,  NO RECURRENCE OR METS   DX JAN 2010 --  STAGE IIIA  NON-SMALL CELL ADENOCARCINOMA (RIGHT MIDDLE LOBE)---  S/P  CHEMORADIATIO  THERAPY  (COMPLETE 03-21-2009)   HTN (hypertension) 10/10/2017   Imbalance 06/04/2017   lung ca dx'd 2010   Normal cardiac stress test    2007  PER PT   Osteoporosis    Rash, skin    RIGHT FOREARM/ HAND   Right wrist pain    MASS   Wears glasses     ALLERGIES:  is allergic to amoxicillin and protonix [pantoprazole sodium].  MEDICATIONS:  Current Outpatient Medications  Medication Sig Dispense Refill   aspirin EC 81 MG tablet Take 81 mg by mouth daily.     augmented betamethasone dipropionate (DIPROLENE-AF) 0.05 % ointment Apply 1 application topically 2 (two) times daily as needed.     bismuth subsalicylate (PEPTO BISMOL) 262 MG/15ML suspension Take 30 mLs by mouth every three (3) days as needed.     cholecalciferol (VITAMIN D) 1000 UNITS tablet Take 1,000 Units by mouth daily.      clindamycin (CLEOCIN T) 1 % external solution APPLY  SOLUTION TOPICALLY TO AFFECTED AREA(S) TWICE DAILY 60 mL 0   Coenzyme Q10 (COQ10) 100 MG CAPS Take 1 capsule by mouth daily.      colchicine 0.6 MG tablet Take 1 tablet (0.6 mg total) by mouth daily as needed (knee pain due to pseudogout). 30 tablet 3   Cyanocobalamin (B-12) 1000 MCG CAPS Take 1,000 mcg by mouth 3 (three) times daily with meals.      erlotinib (TARCEVA) 100 MG tablet Take 1 tablet (100 mg total) by mouth daily. Take on an empty stomach 1 hour before meals or  2 hours after 30 tablet 3   loperamide (IMODIUM A-D) 2 MG tablet Take 1 mg by mouth 4 (four) times daily as needed for diarrhea or loose stools.      loratadine (CLARITIN) 10 MG tablet Take 10 mg by mouth daily as needed for allergies.      LORazepam (ATIVAN) 0.5 MG tablet Take 1 tablet (0.5 mg total) by mouth every 8 (eight) hours as needed for anxiety. 30 tablet 0   meloxicam (MOBIC) 15 MG tablet Take 15 mg by mouth daily.  4   mirtazapine (REMERON) 15 MG tablet TAKE 1 TABLET BY MOUTH AT BEDTIME 30 tablet 0   pantoprazole (PROTONIX) 40 MG tablet TAKE 1 TABLET  BY MOUTH ONCE DAILY 30-60  MINUTES  BEFORE  FIRST  MEAL  OF  THE  DAY 30 tablet 5   Polyethyl Glycol-Propyl Glycol (SYSTANE OP) Place 1 drop into both eyes at bedtime as needed (for dry eyes.).      psyllium (METAMUCIL) 58.6 % powder Take 1 packet by mouth daily as needed.     Simethicone (GAS-X PO) Take 1 tablet by mouth 2 (two) times daily as needed (for gas).      simvastatin (ZOCOR) 20 MG tablet Take 1 tablet (20 mg total) by mouth every Monday, Wednesday, and Friday. 39 tablet 3   Tiotropium Bromide-Olodaterol (STIOLTO RESPIMAT) 2.5-2.5 MCG/ACT AERS Inhale 2 puffs into the lungs daily. 4 g 11   No current facility-administered medications for this visit.    SURGICAL HISTORY:  Past Surgical History:  Procedure Laterality Date   ANAL FISSURE REPAIR  07/09/2011   INTERNAL SPHINCTEROTOMY   BENIGN EXCISION LEFT BREAST CENTRAL DUCT  02/1999   BREAST BIOPSY  01/31/2009   benign   BREAST EXCISIONAL BIOPSY Left 2004   benign   CHEST TUBE INSERTION Right 07/14/2014   Procedure: INSERTION PLEURAL DRAINAGE CATHETER RIGHT CHEST;  Surgeon: Grace Isaac, MD;  Location: [facility];  Service: Thoracic;  Laterality: Right;   EXCISION RIGHT WRIST MASS  2012   EXTRACORPOREAL SHOCK WAVE LITHOTRIPSY Left 06/01/2019   Procedure: EXTRACORPOREAL SHOCK WAVE LITHOTRIPSY (ESWL);  Surgeon: Cleon Gustin, MD;  Location: [facility];  Service: Urology;  Laterality: Left;   KNEE ARTHROSCOPY Right 1999   MASS EXCISION Right 03/11/2014   Procedure: RIGHT WRIST DEEP MASS EXCISION WITH CULTURE AND BIOSPY;  Surgeon: Linna Hoff, MD;  Location: [facility];  Service: Orthopedics;  Laterality: Right;   REMOVAL OF PLEURAL DRAINAGE CATHETER Right 10/14/2014   Procedure: REMOVAL OF PLEURAL DRAINAGE CATHETER;  Surgeon: Grace Isaac, MD;  Location: [facility];  Service: Thoracic;  Laterality: Right;   TALC PLEURODESIS Right 09/16/2014   Procedure: TALC PLEURADESIS/ slurry;  Surgeon: Grace Isaac, MD;  Location: [facility];  Service: Thoracic;  Laterality: Right;   THORACOSCOPY  01-26-2009   w/   lung/  node biopsy's   THUMB ARTHROSCOPY Left    - removed bone spur   TONSILLECTOMY  AS CHILD   TOTAL ABDOMINAL HYSTERECTOMY W/ BILATERAL SALPINGOOPHORECTOMY  1982   W/  APPENDECTOMY   TRANSTHORACIC ECHOCARDIOGRAM  12-23-2008   NORMAL LV/  EF 65-70%/  MILD MR  &  TR   VAULT SUSPENSION PLUS CYSTOCELE REPAIR WITH GRAFT  06-13-2010    REVIEW OF SYSTEMS:  A comprehensive review of systems was negative except for: Constitutional: positive for fatigue Respiratory: positive for dyspnea on exertion   PHYSICAL EXAMINATION: General appearance: alert, cooperative, fatigued and no  distress Head: Normocephalic, without obvious abnormality, atraumatic Neck: no adenopathy, no JVD, supple, symmetrical, trachea midline and thyroid not enlarged, symmetric, no tenderness/mass/nodules Lymph nodes: Cervical, supraclavicular, and axillary nodes normal. Resp: diminished breath sounds RLL and dullness to percussion RLL Back: symmetric, no curvature. ROM normal. No CVA tenderness. Cardio: regular rate and rhythm, S1, S2 normal, no murmur, click, rub or gallop GI: soft, non-tender; bowel sounds normal; no masses,  no organomegaly Extremities: extremities normal, atraumatic, no cyanosis or edema  ECOG PERFORMANCE STATUS: 1 - Symptomatic but completely ambulatory  Blood pressure (!) 172/89, pulse 68, temperature 97.8 F (36.6 C), temperature source Tympanic, resp. rate 18, height _0  (1.626 m), weight 109 lb 8 oz (49.7 kg), SpO2 100 %.  LABORATORY DATA: Lab Results  Component Value Date   WBC 5.5 08/24/2020   HGB 12.6 08/24/2020   HCT 37.1 08/24/2020   MCV 94.4 08/24/2020   PLT 253 08/24/2020      Chemistry      Component Value Date/Time   NA 143 08/24/2020 1017   NA 142 12/10/2017 1000   K 3.9 08/24/2020 1017   K 4.0 12/10/2017 1000   CL 108 08/24/2020 1017   CL 108 (H) 06/03/2013  0939   CO2 29 08/24/2020 1017   CO2 28 12/10/2017 1000   BUN 17 08/24/2020 1017   BUN 20.4 12/10/2017 1000   CREATININE 0.95 08/24/2020 1017   CREATININE 0.9 12/10/2017 1000      Component Value Date/Time   CALCIUM 9.4 08/24/2020 1017   CALCIUM 9.7 12/10/2017 1000   ALKPHOS 41 08/24/2020 1017   ALKPHOS 63 12/10/2017 1000   AST 28 08/24/2020 1017   AST 25 12/10/2017 1000   ALT 27 08/24/2020 1017   ALT 17 12/10/2017 1000   BILITOT 1.3 (H) 08/24/2020 1017   BILITOT 1.05 12/10/2017 1000       RADIOGRAPHIC STUDIES: [place] LIMITED JOINT SPACE STRUCTURES LOW RIGHT  Result Date: 08/11/2020 Procedure: Real-time Ultrasound Guided Injection of right knee superior patellar space laterally Device: Philips Affiniti 50G Images permanently stored and available for review in the ultrasound unit. Verbal informed consent obtained. Discussed risks and benefits of procedure. Warned about infection bleeding damage to structures skin hypopigmentation and fat atrophy among others. Patient expresses understanding and agreement Time-out conducted.  Noted no overlying erythema, induration, or other signs of local infection.  Skin prepped in a sterile fashion.  Local anesthesia: Topical Ethyl chloride.  With sterile technique and under real time ultrasound guidance: 40 mg of Kenalog and 2 mL of Marcaine injected easily.  Completed without difficulty  Pain immediately resolved suggesting accurate placement of the medication.  Advised to call if fevers/chills, erythema, induration, drainage, or persistent bleeding.  Images permanently stored and available for review in the ultrasound unit. Impression: Technically successful ultrasound guided injection.  MM 3D SCREEN BREAST BILATERAL  Result Date: 08/09/2020 CLINICAL DATA:  Screening. EXAM: DIGITAL SCREENING BILATERAL MAMMOGRAM WITH TOMO AND CAD COMPARISON:  Previous exam(s). ACR Breast Density Category c: The breast tissue is heterogeneously dense, which may  obscure small masses. FINDINGS: There are no findings suspicious for malignancy. Images were processed with CAD. IMPRESSION: No mammographic evidence of malignancy. A result letter of this screening mammogram will be mailed directly to the patient. RECOMMENDATION: Screening mammogram in one year. (Code:SM-B-01Y) BI-RADS CATEGORY  1: Negative. Electronically Signed   By: Dorise Bullion III M.D   On: 08/09/2020 14:40    ASSESSMENT AND PLAN:  This is a very pleasant  85 years old white female with stage IIIB non-small cell lung cancer, adenocarcinoma with positive EGFR mutation diagnosed in January 2010. She has been on treatment with Tarceva for more than 9 years.   She is currently on Tarceva 100 mg p.o. daily status post 89  months. The patient has been tolerating her treatment with Tarceva fairly well. Her blood work is unremarkable today. I recommended for her to continue her current treatment with Tarceva with the same dose. I will see her back for follow-up visit in 2 months for evaluation with repeat CT scan of the chest for restaging of her disease. She was advised to call immediately if she has any other concerning symptoms in the interval. The patient voices understanding of current disease status and treatment options and is in agreement with the current care plan. All questions were answered. The patient knows to call the clinic with any problems, questions or concerns. We can certainly see the patient much sooner if necessary. Disclaimer: This note was dictated with voice recognition software. Similar sounding words can inadvertently be transcribed and may not be corrected upon review.

## 2020-08-24 NOTE — Telephone Encounter (Signed)
Scheduled per 09/01 los, patient received calender.

## 2020-08-29 ENCOUNTER — Other Ambulatory Visit: Payer: Self-pay | Admitting: Internal Medicine

## 2020-09-01 ENCOUNTER — Other Ambulatory Visit: Payer: Self-pay | Admitting: Medical Oncology

## 2020-09-01 DIAGNOSIS — C3491 Malignant neoplasm of unspecified part of right bronchus or lung: Secondary | ICD-10-CM

## 2020-09-01 MED ORDER — ERLOTINIB HCL 100 MG PO TABS: 100.0000 mg | ORAL_TABLET | Freq: Every day | ORAL | 3 refills | Status: DC

## 2020-09-03 ENCOUNTER — Other Ambulatory Visit: Payer: Self-pay | Admitting: Internal Medicine

## 2020-09-03 DIAGNOSIS — C3491 Malignant neoplasm of unspecified part of right bronchus or lung: Secondary | ICD-10-CM

## 2020-09-18 ENCOUNTER — Emergency Department (HOSPITAL_BASED_OUTPATIENT_CLINIC_OR_DEPARTMENT_OTHER)
Admission: EM | Admit: 2020-09-18 | Discharge: 2020-09-18 | Disposition: A | Discharge status: Home / Self Care | Payer: Medicare Other | Source: Home / Self Care | Attending: Emergency Medicine | Admitting: Emergency Medicine

## 2020-09-18 ENCOUNTER — Other Ambulatory Visit: Payer: Self-pay

## 2020-09-18 ENCOUNTER — Encounter (HOSPITAL_BASED_OUTPATIENT_CLINIC_OR_DEPARTMENT_OTHER): Payer: Self-pay | Admitting: Emergency Medicine

## 2020-09-18 DIAGNOSIS — A0472 Enterocolitis due to Clostridium difficile, not specified as recurrent: Secondary | ICD-10-CM | POA: Diagnosis not present

## 2020-09-18 DIAGNOSIS — T50Z95A Adverse effect of other vaccines and biological substances, initial encounter: Secondary | ICD-10-CM

## 2020-09-18 DIAGNOSIS — Z85118 Personal history of other malignant neoplasm of bronchus and lung: Secondary | ICD-10-CM | POA: Insufficient documentation

## 2020-09-18 DIAGNOSIS — K8050 Calculus of bile duct without cholangitis or cholecystitis without obstruction: Secondary | ICD-10-CM | POA: Diagnosis not present

## 2020-09-18 DIAGNOSIS — Z7982 Long term (current) use of aspirin: Secondary | ICD-10-CM | POA: Insufficient documentation

## 2020-09-18 DIAGNOSIS — J449 Chronic obstructive pulmonary disease, unspecified: Secondary | ICD-10-CM | POA: Insufficient documentation

## 2020-09-18 DIAGNOSIS — R531 Weakness: Secondary | ICD-10-CM | POA: Insufficient documentation

## 2020-09-18 DIAGNOSIS — I10 Essential (primary) hypertension: Secondary | ICD-10-CM | POA: Insufficient documentation

## 2020-09-18 DIAGNOSIS — Z87891 Personal history of nicotine dependence: Secondary | ICD-10-CM | POA: Insufficient documentation

## 2020-09-18 DIAGNOSIS — Z79899 Other long term (current) drug therapy: Secondary | ICD-10-CM | POA: Insufficient documentation

## 2020-09-18 LAB — COMPREHENSIVE METABOLIC PANEL
ALT: 21 U/L (ref 0–44)
AST: 25 U/L (ref 15–41)
Albumin: 3.5 g/dL (ref 3.5–5.0)
Alkaline Phosphatase: 33 U/L — ABNORMAL LOW (ref 38–126)
Anion gap: 10 (ref 5–15)
BUN: 20 mg/dL (ref 8–23)
CO2: 28 mmol/L (ref 22–32)
Calcium: 9 mg/dL (ref 8.9–10.3)
Chloride: 99 mmol/L (ref 98–111)
Creatinine, Ser: 1.07 mg/dL — ABNORMAL HIGH (ref 0.44–1.00)
GFR calc Af Amer: 56 mL/min — ABNORMAL LOW (ref >60)
GFR calc non Af Amer: 48 mL/min — ABNORMAL LOW (ref >60)
Glucose, Bld: 105 mg/dL — ABNORMAL HIGH (ref 70–99)
Potassium: 4.1 mmol/L (ref 3.5–5.1)
Sodium: 137 mmol/L (ref 135–145)
Total Bilirubin: 1 mg/dL (ref 0.3–1.2)
Total Protein: 6.2 g/dL — ABNORMAL LOW (ref 6.5–8.1)

## 2020-09-18 LAB — CBC WITH DIFFERENTIAL/PLATELET
Abs Immature Granulocytes: 0.03 10*3/uL (ref 0.00–0.07)
Basophils Absolute: 0 10*3/uL (ref 0.0–0.1)
Basophils Relative: 0 %
Eosinophils Absolute: 0.1 10*3/uL (ref 0.0–0.5)
Eosinophils Relative: 1 %
HCT: 38.2 % (ref 36.0–46.0)
Hemoglobin: 12.8 g/dL (ref 12.0–15.0)
Immature Granulocytes: 0 %
Lymphocytes Relative: 7 %
Lymphs Abs: 0.5 10*3/uL — ABNORMAL LOW (ref 0.7–4.0)
MCH: 32 pg (ref 26.0–34.0)
MCHC: 33.5 g/dL (ref 30.0–36.0)
MCV: 95.5 fL (ref 80.0–100.0)
Monocytes Absolute: 0.9 10*3/uL (ref 0.1–1.0)
Monocytes Relative: 11 %
Neutro Abs: 6.3 10*3/uL (ref 1.7–7.7)
Neutrophils Relative %: 81 %
Platelets: 233 10*3/uL (ref 150–400)
RBC: 4 MIL/uL (ref 3.87–5.11)
RDW: 14.6 % (ref 11.5–15.5)
WBC: 7.8 10*3/uL (ref 4.0–10.5)
nRBC: 0 % (ref 0.0–0.2)

## 2020-09-18 MED ORDER — ACETAMINOPHEN 500 MG PO TABS
500.0000 mg | ORAL_TABLET | Freq: Once | ORAL | Status: AC
  Administered 2020-09-18: 500 mg via ORAL
  Filled 2020-09-18: qty 1

## 2020-09-18 MED ORDER — SODIUM CHLORIDE 0.9 % IV BOLUS
500.0000 mL | Freq: Once | INTRAVENOUS | Status: AC
  Administered 2020-09-18: 500 mL via INTRAVENOUS

## 2020-09-18 NOTE — ED Provider Notes (Signed)
[facility] EMERGENCY DEPARTMENT Provider Note   CSN: [id number] Arrival date & time: 09/18/20  1234     History Chief Complaint  Patient presents with  . Allergic Reaction    COVID Booster shot    Dana [name] is a 85 y.o. female.  Presents to ER with concern for allergic reaction.  Patient reports that she was in her normal state of health when she obtained a Covid booster yesterday.  In the evening she started to feel somewhat ill, had body aches, chills.  These continued this morning, she also developed generalized weakness.  Had very low energy, difficulty doing her daily functions.  Had difficulty getting off of the toilet due to her weakness.  Family members came to help her.  She denies any falls, no hitting of head.  At present, she has chills, body aches.  She denies chest pain, difficulty breathing, vomiting.  HPI     Past Medical History:  Diagnosis Date  . Arthritis    HANDS,  WRISTS  . COPD (chronic obstructive pulmonary disease) ([place])   . Depression 04/04/2017  . Dyspnea on exertion   . Encounter for therapeutic drug monitoring 10/01/2016  . GERD (gastroesophageal reflux disease)   . Hemorrhoid   . History of anal fissures   . History of lung cancer ONCOLOGIST--  DR [facility]--  LAST CT ,  NO RECURRENCE OR METS   DX JAN 2010 --  STAGE IIIA  NON-SMALL CELL ADENOCARCINOMA (RIGHT MIDDLE LOBE)---  S/P  CHEMORADIATIO THERAPY  (COMPLETE 03-21-2009)  . HTN (hypertension) 10/10/2017  . Imbalance 06/04/2017  . lung ca dx'd 2010  . Normal cardiac stress test    2007  PER PT  . Osteoporosis   . Rash, skin    RIGHT FOREARM/ HAND  . Right wrist pain    MASS  . Wears glasses     Patient Active Problem List   Diagnosis Date Noted  . COPD GOLD 0/ lama/laba responsive 07/11/2019  . GERD clinical dx only  07/11/2019  . HTN (hypertension) 10/10/2017  . Imbalance 06/04/2017  . Depression 04/04/2017  . Left ankle injury, initial encounter 02/28/2017  .  Encounter for therapeutic drug monitoring 10/01/2016  . DOE (dyspnea on exertion) 07/05/2016  . Pleural effusion, right 07/13/2014  . Right arm cellulitis 02/03/2014  . Cancer of right lung parenchyma ([place]) 11/12/2011    Past Surgical History:  Procedure Laterality Date  . ANAL FISSURE REPAIR  07/09/2011   INTERNAL SPHINCTEROTOMY  . BENIGN EXCISION LEFT BREAST CENTRAL DUCT  02/1999  . BREAST BIOPSY  01/31/2009   benign  . BREAST EXCISIONAL BIOPSY Left 2004   benign  . CHEST TUBE INSERTION Right 07/14/2014   Procedure: INSERTION PLEURAL DRAINAGE CATHETER RIGHT CHEST;  Surgeon: Grace Isaac, MD;  Location: [facility];  Service: Thoracic;  Laterality: Right;  . EXCISION RIGHT WRIST MASS  2012  . EXTRACORPOREAL SHOCK WAVE LITHOTRIPSY Left 06/01/2019   Procedure: EXTRACORPOREAL SHOCK WAVE LITHOTRIPSY (ESWL);  Surgeon: Cleon Gustin, MD;  Location[facility];  Service: Urology;  Laterality: Left;  . KNEE ARTHROSCOPY Right 1999  . MASS EXCISION Right 03/11/2014   Procedure: RIGHT WRIST DEEP MASS EXCISION WITH CULTURE AND BIOSPY;  Surgeon: Linna Hoff, MD;  Location: [facility];  Service: Orthopedics;  Laterality: Right;  . REMOVAL OF PLEURAL DRAINAGE CATHETER Right 10/14/2014   Procedure: REMOVAL OF PLEURAL DRAINAGE CATHETER;  Surgeon: Grace Isaac, MD;  Location: [facility];  Service:  Thoracic;  Laterality: Right;  . TALC PLEURODESIS Right 09/16/2014   Procedure: TALC PLEURADESIS/ slurry;  Surgeon: Grace Isaac, MD;  Location: [facility];  Service: Thoracic;  Laterality: Right;  . THORACOSCOPY  01-26-2009   w/   lung/  node biopsy's  . THUMB ARTHROSCOPY Left    - removed bone spur  . TONSILLECTOMY  AS CHILD  . TOTAL ABDOMINAL HYSTERECTOMY W/ BILATERAL SALPINGOOPHORECTOMY  1982   W/  APPENDECTOMY  . TRANSTHORACIC ECHOCARDIOGRAM  12-23-2008   NORMAL LV/  EF 65-70%/  MILD MR  &  TR  . VAULT SUSPENSION PLUS CYSTOCELE REPAIR WITH GRAFT  06-13-2010     OB History   No  obstetric history on file.     Family History  Problem Relation Age of Onset  . Cancer Father        bladder  . Cancer Son        kidney - removed as a teenager    Social History   Tobacco Use  . Smoking status: Former Smoker    Packs/day: 1.00    Years: 30.00    Pack years: 30.00    Types: Cigarettes    Quit date: 12/24/1977    Years since quitting: 42.7  . Smokeless tobacco: Never Used  Vaping Use  . Vaping Use: Never used  Substance Use Topics  . Alcohol use: No  . Drug use: No    Home Medications Prior to Admission medications   Medication Sig Start Date End Date Taking? Authorizing Provider  aspirin EC 81 MG tablet Take 81 mg by mouth daily.    [provider]  augmented betamethasone dipropionate (DIPROLENE-AF) 0.05 % ointment Apply 1 application topically 2 (two) times daily as needed. 08/22/17   [provider]  bismuth subsalicylate (PEPTO BISMOL) 262 MG/15ML suspension Take 30 mLs by mouth every three (3) days as needed.    [provider]  cholecalciferol (VITAMIN D) 1000 UNITS tablet Take 1,000 Units by mouth daily.     [provider]  clindamycin (CLEOCIN T) 1 % external solution APPLY  SOLUTION TOPICALLY TO AFFECTED AREA(S) TWICE DAILY 02/07/19   Curt Bears, MD  Coenzyme Q10 (COQ10) 100 MG CAPS Take 1 capsule by mouth daily.     [provider]  colchicine 0.6 MG tablet Take 1 tablet (0.6 mg total) by mouth daily as needed (knee pain due to pseudogout). 12/23/19   Gregor Hams, MD  Cyanocobalamin (B-12) 1000 MCG CAPS Take 1,000 mcg by mouth 3 (three) times daily with meals.     [provider]  erlotinib (TARCEVA) 100 MG tablet Take 1 tablet (100 mg total) by mouth daily. Take on an empty stomach 1 hour before meals or 2 hours after 09/01/20   Curt Bears, MD  loperamide (IMODIUM A-D) 2 MG tablet Take 1 mg by mouth 4 (four) times daily as needed for diarrhea or loose stools.     [provider]  loratadine (CLARITIN) 10 MG tablet Take 10 mg by mouth daily as needed for allergies.     [provider]  LORazepam (ATIVAN) 0.5 MG tablet Take 1 tablet (0.5 mg total) by mouth every 8 (eight) hours as needed for anxiety. 10/14/14   Curt Bears, MD  meloxicam (MOBIC) 15 MG tablet Take 15 mg by mouth daily. 04/30/18   [provider]  mirtazapine (REMERON) 15 MG tablet TAKE 1 TABLET BY MOUTH AT BEDTIME 09/04/20   Curt Bears, MD  pantoprazole (  PROTONIX) 40 MG tablet TAKE 1 TABLET BY MOUTH ONCE DAILY 30-60  MINUTES  BEFORE  FIRST  MEAL  OF  THE  DAY 06/30/20   Tanda Rockers, MD  Polyethyl Glycol-Propyl Glycol (SYSTANE OP) Place 1 drop into both eyes at bedtime as needed (for dry eyes.).     [provider]  psyllium (METAMUCIL) 58.6 % powder Take 1 packet by mouth daily as needed.    [provider]  Simethicone (GAS-X PO) Take 1 tablet by mouth 2 (two) times daily as needed (for gas).     [provider]  simvastatin (ZOCOR) 20 MG tablet Take 1 tablet (20 mg total) by mouth every Monday, Wednesday, and Friday. 06/13/20   Josue Hector, MD  STIOLTO RESPIMAT 2.5-2.5 MCG/ACT AERS INHALE 2 PUFFS BY MOUTH ONCE DAILY 08/30/20   Tanda Rockers, MD  mirtazapine (REMERON) 15 MG tablet TAKE 1 TABLET BY MOUTH AT BEDTIME 08/03/20   Curt Bears, MD    Allergies    Amoxicillin and Protonix [pantoprazole sodium]  Review of Systems   Review of Systems  Constitutional: Positive for chills and fatigue. Negative for fever.  HENT: Negative for ear pain and sore throat.   Eyes: Negative for pain and visual disturbance.  Respiratory: Negative for cough and shortness of breath.   Cardiovascular: Negative for chest pain and palpitations.  Gastrointestinal: Negative for abdominal pain and vomiting.  Genitourinary: Negative for dysuria and hematuria.  Musculoskeletal: Positive for arthralgias and myalgias. Negative for back pain.  Skin: Negative for color  change and rash.  Neurological: Negative for seizures and syncope.  All other systems reviewed and are negative.   Physical Exam Updated Vital Signs BP (!) 106/94 (BP Location: Right Arm)   Pulse 91   Temp 98.4 F (36.9 C) (Oral)   Resp 18   Ht 5\' 6"  (1.676 m)   Wt 47.6 kg   SpO2 98%   BMI 16.95 kg/m   Physical Exam Vitals and nursing note reviewed.  Constitutional:      General: She is not in acute distress.    Appearance: She is well-developed.  HENT:     Head: Normocephalic and atraumatic.  Eyes:     Conjunctiva/sclera: Conjunctivae normal.  Cardiovascular:     Rate and Rhythm: Normal rate and regular rhythm.     Heart sounds: No murmur heard.   Pulmonary:     Effort: Pulmonary effort is normal. No respiratory distress.     Breath sounds: Normal breath sounds.  Abdominal:     Palpations: Abdomen is soft.     Tenderness: There is no abdominal tenderness.  Musculoskeletal:     Cervical back: Neck supple.  Skin:    General: Skin is warm and dry.  Neurological:     Mental Status: She is alert.     Comments: AAOx3 CN 2-12 intact, speech clear visual fields intact 5/5 strength in b/l UE and LE Sensation to light touch intact in b/l UE and LE Normal FNF Normal gait  Psychiatric:        Mood and Affect: Mood normal.        Behavior: Behavior normal.     ED Results / Procedures / Treatments   Labs (all labs ordered are listed, but only abnormal results are displayed) Labs Reviewed  CBC WITH DIFFERENTIAL/PLATELET - Abnormal; Notable for the following components:      Result Value   Lymphs Abs 0.5 (*)    All other components within  normal limits  COMPREHENSIVE METABOLIC PANEL - Abnormal; Notable for the following components:   Glucose, Bld 105 (*)    Creatinine, Ser 1.07 (*)    Total Protein 6.2 (*)    Alkaline Phosphatase 33 (*)    GFR calc non Af Amer 48 (*)    GFR calc Af Amer 56 (*)    All other components within normal limits    EKG EKG  Interpretation  Date/Time:  Sunday September 18 2020 13:58:04 EDT Ventricular Rate:  102 PR Interval:    QRS Duration: 84 QT Interval:  325 QTC Calculation: 424 R Axis:   -45 Text Interpretation: Sinus tachycardia Left anterior fascicular block Confirmed by Darnetta Kesselman ([id number]) on 09/18/2020 2:14:55 PM   Radiology No results found.  Procedures Procedures (including critical care time)  Medications Ordered in ED Medications  acetaminophen (TYLENOL) tablet 500 mg (500 mg Oral Given 09/18/20 1359)  sodium chloride 0.9 % bolus 500 mL ( Intravenous Stopped 09/18/20 1457)    ED Course  I have reviewed the triage vital signs and the nursing notes.  Pertinent labs & imaging results that were available during my care of the patient were reviewed by me and considered in my medical decision making (see chart for details).  Clinical Course as of Sep 18 1548  Sun Sep 18, 2020  1526 Rechecked patient, symptoms improved, will discharge home   [RD]    Clinical Course User Index [RD] Travez Stancil S, MD   MDM Rules/Calculators/A&P                         85  year old lady presents to ER with chills, myalgias, generalized weakness after recent Covid booster shot.  On exam patient well-appearing, normal neurologic exam.  Based on symptomatology, suspect side effect from vaccine.  Patient symptoms improved while in ER after receiving Tylenol and Motrin and small fluids.  Her labs were grossly stable.  Recommended that she have follow-up with her primary doctor this coming week.  Daughter at bedside, agreeable with plan.  Discharged home.    After the discussed management above, the patient was determined to be safe for discharge.  The patient was in agreement with this plan and all questions regarding their care were answered.  ED return precautions were discussed and the patient will return to the ED with any significant worsening of condition.    Final Clinical Impression(s) / ED  Diagnoses Final diagnoses:  Adverse effect of vaccine, initial encounter    Rx / DC Orders ED Discharge Orders    None       Lucrezia Starch, MD 09/18/20 [phone]

## 2020-09-18 NOTE — Discharge Instructions (Addendum)
Recommend Tylenol as needed for chills, myalgias.  Recommend recheck with your primary doctor this week.  If you develop chest pain, difficulty breathing, or other new concerning symptom, return to ER for reassessment.

## 2020-09-18 NOTE — ED Triage Notes (Signed)
Pt reports weakness, unable to stand up or sit up while lying in the bed. Pt also reports chills. Pt has decreased appetite. Pt received booster COVID vaccine Therapist, music) yesterday.

## 2020-09-21 ENCOUNTER — Other Ambulatory Visit: Payer: Self-pay

## 2020-09-21 ENCOUNTER — Encounter (HOSPITAL_BASED_OUTPATIENT_CLINIC_OR_DEPARTMENT_OTHER): Payer: Self-pay | Admitting: Emergency Medicine

## 2020-09-21 ENCOUNTER — Inpatient Hospital Stay (HOSPITAL_BASED_OUTPATIENT_CLINIC_OR_DEPARTMENT_OTHER)
Admission: EM | Admit: 2020-09-21 | Discharge: 2020-09-28 | DRG: 372 | Disposition: A | Discharge status: Skilled Nursing Facility | Payer: Medicare Other | Attending: Internal Medicine | Admitting: Internal Medicine

## 2020-09-21 ENCOUNTER — Emergency Department (HOSPITAL_BASED_OUTPATIENT_CLINIC_OR_DEPARTMENT_OTHER): Payer: Medicare Other

## 2020-09-21 DIAGNOSIS — K219 Gastro-esophageal reflux disease without esophagitis: Secondary | ICD-10-CM | POA: Diagnosis present

## 2020-09-21 DIAGNOSIS — R5381 Other malaise: Secondary | ICD-10-CM | POA: Diagnosis present

## 2020-09-21 DIAGNOSIS — C342 Malignant neoplasm of middle lobe, bronchus or lung: Secondary | ICD-10-CM | POA: Diagnosis present

## 2020-09-21 DIAGNOSIS — Z923 Personal history of irradiation: Secondary | ICD-10-CM | POA: Diagnosis not present

## 2020-09-21 DIAGNOSIS — Z20822 Contact with and (suspected) exposure to covid-19: Secondary | ICD-10-CM | POA: Diagnosis present

## 2020-09-21 DIAGNOSIS — M791 Myalgia, unspecified site: Secondary | ICD-10-CM | POA: Diagnosis present

## 2020-09-21 DIAGNOSIS — E876 Hypokalemia: Secondary | ICD-10-CM | POA: Diagnosis present

## 2020-09-21 DIAGNOSIS — Z79899 Other long term (current) drug therapy: Secondary | ICD-10-CM | POA: Diagnosis not present

## 2020-09-21 DIAGNOSIS — M19032 Primary osteoarthritis, left wrist: Secondary | ICD-10-CM | POA: Diagnosis present

## 2020-09-21 DIAGNOSIS — E440 Moderate protein-calorie malnutrition: Secondary | ICD-10-CM | POA: Diagnosis present

## 2020-09-21 DIAGNOSIS — R197 Diarrhea, unspecified: Secondary | ICD-10-CM | POA: Diagnosis not present

## 2020-09-21 DIAGNOSIS — I959 Hypotension, unspecified: Secondary | ICD-10-CM | POA: Diagnosis present

## 2020-09-21 DIAGNOSIS — Z681 Body mass index (BMI) 19 or less, adult: Secondary | ICD-10-CM

## 2020-09-21 DIAGNOSIS — Z9221 Personal history of antineoplastic chemotherapy: Secondary | ICD-10-CM | POA: Diagnosis not present

## 2020-09-21 DIAGNOSIS — Z7982 Long term (current) use of aspirin: Secondary | ICD-10-CM

## 2020-09-21 DIAGNOSIS — T50B95A Adverse effect of other viral vaccines, initial encounter: Secondary | ICD-10-CM | POA: Diagnosis present

## 2020-09-21 DIAGNOSIS — I10 Essential (primary) hypertension: Secondary | ICD-10-CM | POA: Diagnosis present

## 2020-09-21 DIAGNOSIS — N179 Acute kidney failure, unspecified: Secondary | ICD-10-CM | POA: Diagnosis present

## 2020-09-21 DIAGNOSIS — J449 Chronic obstructive pulmonary disease, unspecified: Secondary | ICD-10-CM | POA: Diagnosis present

## 2020-09-21 DIAGNOSIS — C3491 Malignant neoplasm of unspecified part of right bronchus or lung: Secondary | ICD-10-CM | POA: Diagnosis present

## 2020-09-21 DIAGNOSIS — M19031 Primary osteoarthritis, right wrist: Secondary | ICD-10-CM | POA: Diagnosis present

## 2020-09-21 DIAGNOSIS — Z87891 Personal history of nicotine dependence: Secondary | ICD-10-CM

## 2020-09-21 DIAGNOSIS — Z791 Long term (current) use of non-steroidal anti-inflammatories (NSAID): Secondary | ICD-10-CM | POA: Diagnosis not present

## 2020-09-21 DIAGNOSIS — K8020 Calculus of gallbladder without cholecystitis without obstruction: Secondary | ICD-10-CM | POA: Diagnosis present

## 2020-09-21 DIAGNOSIS — K8050 Calculus of bile duct without cholangitis or cholecystitis without obstruction: Secondary | ICD-10-CM | POA: Diagnosis present

## 2020-09-21 DIAGNOSIS — A0472 Enterocolitis due to Clostridium difficile, not specified as recurrent: Principal | ICD-10-CM | POA: Diagnosis present

## 2020-09-21 DIAGNOSIS — Z880 Allergy status to penicillin: Secondary | ICD-10-CM | POA: Diagnosis not present

## 2020-09-21 DIAGNOSIS — R7989 Other specified abnormal findings of blood chemistry: Secondary | ICD-10-CM | POA: Diagnosis present

## 2020-09-21 LAB — CBC WITH DIFFERENTIAL/PLATELET
Abs Immature Granulocytes: 0.08 10*3/uL — ABNORMAL HIGH (ref 0.00–0.07)
Basophils Absolute: 0.1 10*3/uL (ref 0.0–0.1)
Basophils Relative: 1 %
Eosinophils Absolute: 0 10*3/uL (ref 0.0–0.5)
Eosinophils Relative: 0 %
HCT: 42.2 % (ref 36.0–46.0)
Hemoglobin: 14 g/dL (ref 12.0–15.0)
Immature Granulocytes: 1 %
Lymphocytes Relative: 10 %
Lymphs Abs: 0.8 10*3/uL (ref 0.7–4.0)
MCH: 31.4 pg (ref 26.0–34.0)
MCHC: 33.2 g/dL (ref 30.0–36.0)
MCV: 94.6 fL (ref 80.0–100.0)
Monocytes Absolute: 0.7 10*3/uL (ref 0.1–1.0)
Monocytes Relative: 9 %
Neutro Abs: 5.8 10*3/uL (ref 1.7–7.7)
Neutrophils Relative %: 79 %
Platelets: 286 10*3/uL (ref 150–400)
RBC: 4.46 MIL/uL (ref 3.87–5.11)
RDW: 14.4 % (ref 11.5–15.5)
WBC: 7.4 10*3/uL (ref 4.0–10.5)
nRBC: 0 % (ref 0.0–0.2)

## 2020-09-21 LAB — COMPREHENSIVE METABOLIC PANEL
ALT: 88 U/L — ABNORMAL HIGH (ref 0–44)
AST: 201 U/L — ABNORMAL HIGH (ref 15–41)
Albumin: 3.4 g/dL — ABNORMAL LOW (ref 3.5–5.0)
Alkaline Phosphatase: 34 U/L — ABNORMAL LOW (ref 38–126)
Anion gap: 16 — ABNORMAL HIGH (ref 5–15)
BUN: 40 mg/dL — ABNORMAL HIGH (ref 8–23)
CO2: 23 mmol/L (ref 22–32)
Calcium: 8.4 mg/dL — ABNORMAL LOW (ref 8.9–10.3)
Chloride: 99 mmol/L (ref 98–111)
Creatinine, Ser: 2.37 mg/dL — ABNORMAL HIGH (ref 0.44–1.00)
GFR calc Af Amer: 21 mL/min — ABNORMAL LOW (ref >60)
GFR calc non Af Amer: 18 mL/min — ABNORMAL LOW (ref >60)
Glucose, Bld: 129 mg/dL — ABNORMAL HIGH (ref 70–99)
Potassium: 3.1 mmol/L — ABNORMAL LOW (ref 3.5–5.1)
Sodium: 138 mmol/L (ref 135–145)
Total Bilirubin: 0.9 mg/dL (ref 0.3–1.2)
Total Protein: 6.9 g/dL (ref 6.5–8.1)

## 2020-09-21 LAB — C DIFFICILE QUICK SCREEN W PCR REFLEX
C Diff antigen: POSITIVE — AB
C Diff interpretation: DETECTED
C Diff toxin: POSITIVE — AB

## 2020-09-21 LAB — RESPIRATORY PANEL BY RT PCR (FLU A&B, COVID)
Influenza A by PCR: NEGATIVE
Influenza B by PCR: NEGATIVE
SARS Coronavirus 2 by RT PCR: NEGATIVE

## 2020-09-21 LAB — LACTIC ACID, PLASMA
Lactic Acid, Venous: 2.4 mmol/L (ref 0.5–1.9)
Lactic Acid, Venous: 2.2 mmol/L (ref 0.5–1.9)

## 2020-09-21 MED ORDER — SODIUM CHLORIDE 0.9 % IV SOLN
INTRAVENOUS | Status: DC | PRN
  Administered 2020-09-21: 500 mL via INTRAVENOUS

## 2020-09-21 MED ORDER — LACTATED RINGERS IV BOLUS
1000.0000 mL | Freq: Once | INTRAVENOUS | Status: AC
  Administered 2020-09-21: 1000 mL via INTRAVENOUS

## 2020-09-21 MED ORDER — PIPERACILLIN-TAZOBACTAM 3.375 G IVPB 30 MIN
3.3750 g | Freq: Once | INTRAVENOUS | Status: AC
  Administered 2020-09-21: 3.375 g via INTRAVENOUS
  Filled 2020-09-21: qty 50

## 2020-09-21 MED ORDER — PIPERACILLIN-TAZOBACTAM IN DEX 2-0.25 GM/50ML IV SOLN
2.2500 g | Freq: Three times a day (TID) | INTRAVENOUS | Status: DC
  Administered 2020-09-22: 2.25 g via INTRAVENOUS
  Filled 2020-09-21 (×3): qty 50

## 2020-09-21 MED ORDER — SODIUM CHLORIDE 0.9 % IV BOLUS
1000.0000 mL | Freq: Once | INTRAVENOUS | Status: AC
  Administered 2020-09-21: 1000 mL via INTRAVENOUS

## 2020-09-21 NOTE — ED Notes (Signed)
Assisted up to [facility], expelled large amy of watery brown stool

## 2020-09-21 NOTE — Progress Notes (Signed)
Pharmacy Antibiotic Note  Dana [name] is a 85 y.o. female admitted on 09/21/2020 with intra-abdominal infection (choledocholithiasis).  Pharmacy has been consulted for Zosyn dosing.  Plan: Zosyn 2.25 g IV every 8 hours Monitor renal function, clinical progression and LOT     Temp (24hrs), Avg:97.7 F (36.5 C), Min:97.7 F (36.5 C), Max:97.7 F (36.5 C)  Recent Labs  Lab 09/18/20 1408 09/21/20 1132 09/21/20 1327  WBC 7.8 7.4  --   CREATININE 1.07* 2.37*  --   LATICACIDVEN  --  2.4* 2.2*    Estimated Creatinine Clearance: 13.5 mL/min (A) (by C-G formula based on SCr of 2.37 mg/dL (H)).    Allergies  Allergen Reactions  . Amoxicillin Diarrhea    Hx of C Diff   . Protonix [Pantoprazole Sodium] Rash    Bertis Ruddy, PharmD Clinical Pharmacist ED Pharmacist Phone # [phone] 09/21/2020 5:12 PM

## 2020-09-21 NOTE — ED Notes (Addendum)
Called for a consult GI, with answering service, for Dr. Benson Norway

## 2020-09-21 NOTE — ED Notes (Signed)
Report given to [facility] RN receiving nurse at [employer]

## 2020-09-21 NOTE — ED Notes (Signed)
Report given to [facility] with [facility]

## 2020-09-21 NOTE — ED Notes (Signed)
Assisted to [facility], informed of needing stool sample. Daughter at bedside, will let staff know when she is done

## 2020-09-21 NOTE — ED Notes (Signed)
Given tea and graham crackers. Daughter at bedside

## 2020-09-21 NOTE — ED Provider Notes (Signed)
[facility] EMERGENCY DEPARTMENT Provider Note   CSN: [id number] Arrival date & time: 09/21/20  1037     History Chief Complaint  Patient presents with  . Diarrhea    Dana [name] is a 85 y.o. female possible history of COPD, GERD, Raynaud's, hypertension, lung cancer brought in for evaluation of diarrhea that has been ongoing for the last 4 days.  She reports that on 09/18/20, she got her Covid booster vaccine.  She reports that shortly after, she started having some reaction to it.  She was seen in the ED and was discharged home.  She reports that since then, she has had diarrhea.  She reports multiple episodes of watery diarrhea a day.  No blood in stool.  No recent antibiotics.  No travel outside the country.  She denies any abdominal pain.  She she has not had much of an appetite and every time she eats something, she has diarrhea shortly after.  No vomiting, abdominal pain.  She has been trying to take Imodium which she states has no relief.  She has had history of diarrhea in the past before.  She states normally when she takes an Imodium, it fixes it but this time, it has not helped.  She has had a slight cough but she thought is common for her given her history of lung cancer.  She has not had any difficulty breathing, chest pain, abdominal pain, fevers, chills.  The history is provided by the patient.       Past Medical History:  Diagnosis Date  . Arthritis    HANDS,  WRISTS  . COPD (chronic obstructive pulmonary disease) ([place])   . Depression 04/04/2017  . Dyspnea on exertion   . Encounter for therapeutic drug monitoring 10/01/2016  . GERD (gastroesophageal reflux disease)   . Hemorrhoid   . History of anal fissures   . History of lung cancer ONCOLOGIST--  [facility]--  LAST CT ,  NO RECURRENCE OR METS   DX JAN 2010 --  STAGE IIIA  NON-SMALL CELL ADENOCARCINOMA (RIGHT MIDDLE LOBE)---  S/P  CHEMORADIATIO THERAPY  (COMPLETE 03-21-2009)  . HTN (hypertension)  10/10/2017  . Imbalance 06/04/2017  . lung ca dx'd 2010  . Normal cardiac stress test    2007  PER PT  . Osteoporosis   . Rash, skin    RIGHT FOREARM/ HAND  . Right wrist pain    MASS  . Wears glasses     Patient Active Problem List   Diagnosis Date Noted  . Choledocholithiasis 09/21/2020  . COPD GOLD 0/ lama/laba responsive 07/11/2019  . GERD clinical dx only  07/11/2019  . HTN (hypertension) 10/10/2017  . Imbalance 06/04/2017  . Depression 04/04/2017  . Left ankle injury, initial encounter 02/28/2017  . Encounter for therapeutic drug monitoring 10/01/2016  . DOE (dyspnea on exertion) 07/05/2016  . Pleural effusion, right 07/13/2014  . Right arm cellulitis 02/03/2014  . Cancer of right lung parenchyma ([place]) 11/12/2011    Past Surgical History:  Procedure Laterality Date  . ANAL FISSURE REPAIR  07/09/2011   INTERNAL SPHINCTEROTOMY  . BENIGN EXCISION LEFT BREAST CENTRAL DUCT  02/1999  . BREAST BIOPSY  01/31/2009   benign  . BREAST EXCISIONAL BIOPSY Left 2004   benign  . CHEST TUBE INSERTION Right 07/14/2014   Procedure: INSERTION PLEURAL DRAINAGE CATHETER RIGHT CHEST;  Surgeon: Grace Isaac, MD;  Location: [facility];  Service: Thoracic;  Laterality: Right;  . EXCISION RIGHT WRIST MASS  2012  . EXTRACORPOREAL SHOCK WAVE LITHOTRIPSY Left 06/01/2019   Procedure: EXTRACORPOREAL SHOCK WAVE LITHOTRIPSY (ESWL);  Surgeon: Cleon Gustin, MD;  Location: [facility];  Service: Urology;  Laterality: Left;  . KNEE ARTHROSCOPY Right 1999  . MASS EXCISION Right 03/11/2014   Procedure: RIGHT WRIST DEEP MASS EXCISION WITH CULTURE AND BIOSPY;  Surgeon: Linna Hoff, MD;  Location: [facility];  Service: Orthopedics;  Laterality: Right;  . REMOVAL OF PLEURAL DRAINAGE CATHETER Right 10/14/2014   Procedure: REMOVAL OF PLEURAL DRAINAGE CATHETER;  Surgeon: Grace Isaac, MD;  Location: [facility];  Service: Thoracic;  Laterality: Right;  . TALC PLEURODESIS Right 09/16/2014    Procedure: TALC PLEURADESIS/ slurry;  Surgeon: Grace Isaac, MD;  Location: [facility] [place];  Service: Thoracic;  Laterality: Right;  . THORACOSCOPY  01-26-2009   w/   lung/  node biopsy's  . THUMB ARTHROSCOPY Left    - removed bone spur  . TONSILLECTOMY  AS CHILD  . TOTAL ABDOMINAL HYSTERECTOMY W/ BILATERAL SALPINGOOPHORECTOMY  1982   W/  APPENDECTOMY  . TRANSTHORACIC ECHOCARDIOGRAM  12-23-2008   NORMAL LV/  EF 65-70%/  MILD MR  &  TR  . VAULT SUSPENSION PLUS CYSTOCELE REPAIR WITH GRAFT  06-13-2010     OB History   No obstetric history on file.     Family History  Problem Relation Age of Onset  . Cancer Father        bladder  . Cancer Son        kidney - removed as a teenager    Social History   Tobacco Use  . Smoking status: Former Smoker    Packs/day: 1.00    Years: 30.00    Pack years: 30.00    Types: Cigarettes    Quit date: 12/24/1977    Years since quitting: 42.7  . Smokeless tobacco: Never Used  Vaping Use  . Vaping Use: Never used  Substance Use Topics  . Alcohol use: No  . Drug use: No    Home Medications Prior to Admission medications   Medication Sig Start Date End Date Taking? Authorizing Provider  aspirin EC 81 MG tablet Take 81 mg by mouth daily.    [provider]  augmented betamethasone dipropionate (DIPROLENE-AF) 0.05 % ointment Apply 1 application topically 2 (two) times daily as needed. 08/22/17   [provider]  bismuth subsalicylate (PEPTO BISMOL) 262 MG/15ML suspension Take 30 mLs by mouth every three (3) days as needed.    [provider]  cholecalciferol (VITAMIN D) 1000 UNITS tablet Take 1,000 Units by mouth daily.     [provider]  clindamycin (CLEOCIN T) 1 % external solution APPLY  SOLUTION TOPICALLY TO AFFECTED AREA(S) TWICE DAILY 02/07/19   Curt Bears, MD  Coenzyme Q10 (COQ10) 100 MG CAPS Take 1 capsule by mouth daily.     [provider]  colchicine 0.6 MG tablet Take 1 tablet (0.6 mg  total) by mouth daily as needed (knee pain due to pseudogout). 12/23/19   Gregor Hams, MD  Cyanocobalamin (B-12) 1000 MCG CAPS Take 1,000 mcg by mouth 3 (three) times daily with meals.     [provider]  erlotinib (TARCEVA) 100 MG tablet Take 1 tablet (100 mg total) by mouth daily. Take on an empty stomach 1 hour before meals or 2 hours after 09/01/20   Curt Bears, MD  loperamide (IMODIUM A-D) 2 MG tablet Take 1 mg by mouth 4 (four) times daily  as needed for diarrhea or loose stools.     [provider]  loratadine (CLARITIN) 10 MG tablet Take 10 mg by mouth daily as needed for allergies.     [provider]  LORazepam (ATIVAN) 0.5 MG tablet Take 1 tablet (0.5 mg total) by mouth every 8 (eight) hours as needed for anxiety. 10/14/14   Curt Bears, MD  meloxicam (MOBIC) 15 MG tablet Take 15 mg by mouth daily. 04/30/18   [provider]  mirtazapine (REMERON) 15 MG tablet TAKE 1 TABLET BY MOUTH AT BEDTIME 09/04/20   Curt Bears, MD  pantoprazole (PROTONIX) 40 MG tablet TAKE 1 TABLET BY MOUTH ONCE DAILY 30-60  MINUTES  BEFORE  FIRST  MEAL  OF  THE  DAY 06/30/20   Tanda Rockers, MD  Polyethyl Glycol-Propyl Glycol (SYSTANE OP) Place 1 drop into both eyes at bedtime as needed (for dry eyes.).     [provider]  psyllium (METAMUCIL) 58.6 % powder Take 1 packet by mouth daily as needed.    [provider]  Simethicone (GAS-X PO) Take 1 tablet by mouth 2 (two) times daily as needed (for gas).     [provider]  simvastatin (ZOCOR) 20 MG tablet Take 1 tablet (20 mg total) by mouth every Monday, Wednesday, and Friday. 06/13/20   Josue Hector, MD  STIOLTO RESPIMAT 2.5-2.5 MCG/ACT AERS INHALE 2 PUFFS BY MOUTH ONCE DAILY 08/30/20   Tanda Rockers, MD  mirtazapine (REMERON) 15 MG tablet TAKE 1 TABLET BY MOUTH AT BEDTIME 08/03/20   Curt Bears, MD    Allergies    Amoxicillin and Protonix [pantoprazole sodium]  Review of Systems    Review of Systems  Constitutional: Positive for appetite change and fatigue. Negative for fever.  Respiratory: Positive for cough. Negative for shortness of breath.   Cardiovascular: Negative for chest pain.  Gastrointestinal: Positive for diarrhea. Negative for abdominal pain, nausea and vomiting.  Genitourinary: Negative for dysuria and hematuria.  Neurological: Negative for headaches.  All other systems reviewed and are negative.   Physical Exam Updated Vital Signs BP 111/60 (BP Location: Right Arm)   Pulse (!) 103   Temp 97.7 F (36.5 C) (Oral)   Resp 19   SpO2 100%   Physical Exam Vitals and nursing note reviewed.  Constitutional:      Appearance: Normal appearance. She is well-developed.     Comments: Frail and elderly appearing  HENT:     Head: Normocephalic and atraumatic.  Eyes:     General: Lids are normal.     Conjunctiva/sclera: Conjunctivae normal.     Pupils: Pupils are equal, round, and reactive to light.  Cardiovascular:     Rate and Rhythm: Regular rhythm. Bradycardia present.     Pulses: Normal pulses.     Heart sounds: Normal heart sounds. No murmur heard.  No friction rub. No gallop.   Pulmonary:     Effort: Pulmonary effort is normal.     Breath sounds: Normal breath sounds.     Comments: Lungs clear to auscultation bilaterally.  Symmetric chest rise.  No wheezing, rales, rhonchi. No evidence of respiratory distress. Able to speak in full sentences without difficulty.  Abdominal:     Palpations: Abdomen is soft. Abdomen is not rigid.     Tenderness: There is no abdominal tenderness. There is no guarding.     Comments: Abdomen is soft, non-distended, non-tender. No rigidity, No guarding. No peritoneal signs.  Musculoskeletal:  General: Normal range of motion.     Cervical back: Full passive range of motion without pain.  Skin:    General: Skin is warm and dry.     Capillary Refill: Capillary refill takes less than 2 seconds.  Neurological:      Mental Status: She is alert and oriented to person, place, and time.  Psychiatric:        Speech: Speech normal.     ED Results / Procedures / Treatments   Labs (all labs ordered are listed, but only abnormal results are displayed) Labs Reviewed  C DIFFICILE QUICK SCREEN W PCR REFLEX - Abnormal; Notable for the following components:      Result Value   C Diff antigen POSITIVE (*)    C Diff toxin POSITIVE (*)    All other components within normal limits  COMPREHENSIVE METABOLIC PANEL - Abnormal; Notable for the following components:   Potassium 3.1 (*)    Glucose, Bld 129 (*)    BUN 40 (*)    Creatinine, Ser 2.37 (*)    Calcium 8.4 (*)    Albumin 3.4 (*)    AST 201 (*)    ALT 88 (*)    Alkaline Phosphatase 34 (*)    GFR calc non Af Amer 18 (*)    GFR calc Af Amer 21 (*)    Anion gap 16 (*)    All other components within normal limits  CBC WITH DIFFERENTIAL/PLATELET - Abnormal; Notable for the following components:   Abs Immature Granulocytes 0.08 (*)    All other components within normal limits  LACTIC ACID, PLASMA - Abnormal; Notable for the following components:   Lactic Acid, Venous 2.4 (*)    All other components within normal limits  LACTIC ACID, PLASMA - Abnormal; Notable for the following components:   Lactic Acid, Venous 2.2 (*)    All other components within normal limits  RESPIRATORY PANEL BY RT PCR (FLU A&B, COVID)  GASTROINTESTINAL PANEL BY PCR, STOOL (REPLACES STOOL CULTURE)  URINALYSIS, ROUTINE W REFLEX MICROSCOPIC    EKG EKG Interpretation  Date/Time:  Wednesday September 21 2020 11:23:07 EDT Ventricular Rate:  96 PR Interval:    QRS Duration: 97 QT Interval:  353 QTC Calculation: 447 R Axis:   -18 Text Interpretation: Sinus rhythm Borderline left axis deviation Confirmed by Madalyn Rob [phone]) on 09/21/2020 11:44:45 AM   Radiology CT ABDOMEN PELVIS WO CONTRAST  Result Date: 09/21/2020 CLINICAL DATA:  Diarrhea, 3 days ago seen for  the same symptoms. Booster vaccine 5 days ago. Lethargic. History of lung cancer, COPD, hypertension. EXAM: CT ABDOMEN AND PELVIS WITHOUT CONTRAST TECHNIQUE: Multidetector CT imaging of the abdomen and pelvis was performed following the standard protocol without IV contrast. COMPARISON:  CT abdomen pelvis 05/28/2019. FINDINGS: Lower chest: Bi basilar linear atelectasis versus scarring. Hepatobiliary: No focal liver abnormality is seen. The gallbladder is noted to be hydropic measuring up to 5.7 cm on axial imaging. Layering hyperdensity within its lumen consistent with calcified gallstones. No CT findings suggest gallbladder wall thickening or pericholecystic fluid. Possible mild central intrahepatic biliary dilatation. Interval development of a prominent common bile duct measuring up to 1 cm. The common bile duct does not demonstrate tapering to normal caliber distally. Pancreas: Unremarkable. No pancreatic ductal dilatation or surrounding inflammatory changes. Spleen: Normal in size without focal abnormality. Adrenals/Urinary Tract: No adrenal nodule bilaterally. No nephrolithiasis, no hydronephrosis, and no contour-deforming renal mass. No ureterolithiasis or hydroureter. The urinary bladder is unremarkable. Stomach/Bowel: Stomach  is within normal limits. The appendix is not definitely identified. No evidence of small bowel wall thickening, dilatation, or inflammatory changes. The colon is distended and fluid-filled. No large bowel wall thickening. No definite pericolonic fat stranding. Redemonstration of medialized mobile cecum. No pneumatosis. Vascular/Lymphatic: No abdominal aorta or iliac aneurysm. Moderate to severe atherosclerotic plaque of the aorta and its branches. No abdominal, pelvic, or inguinal lymphadenopathy. Reproductive: Prostate is unremarkable. Other: No intraperitoneal free fluid. No intraperitoneal free gas. No organized fluid collection. Musculoskeletal: No abdominal wall hernia or  abnormality. Fatty infiltration of the gluteal and paraspinal musculature bilaterally. Dextrocurvature of lumbar spine centered at the L2-L3 level. Associated multilevel moderate severe degenerative changes of the spine. No suspicious lytic or blastic osseous lesions. No acute displaced fracture. IMPRESSION: 1. Cholelithiasis with interval development of a hydropic gallbladder and prominent common bile duct that does not taper to normal a caliber distally. Findings concerning for choledocholithiasis. Consider right upper quadrant ultrasound or MRCP for further evaluation. 2. Findings consistent with a rapid bowel transit time. No definite colitis. Electronically Signed   By: Iven Finn M.D.   On: 09/21/2020 17:04   DG Chest Portable 1 View  Result Date: 09/21/2020 CLINICAL DATA:  Cough.  History of lung cancer. EXAM: PORTABLE CHEST 1 VIEW COMPARISON:  11/25/2018 FINDINGS: Chronic scarring in the right lung base medially unchanged. Blunting right costophrenic angle unchanged. Heart size and vascularity normal. No heart failure identified. Left lung clear. IMPRESSION: Chronic scarring right lung base unchanged. No superimposed acute abnormality. Electronically Signed   By: Franchot Gallo M.D.   On: 09/21/2020 12:01    Procedures Procedures (including critical care time)  Medications Ordered in ED Medications  piperacillin-tazobactam (ZOSYN) IVPB 2.25 g (has no administration in time range)  0.9 %  sodium chloride infusion (500 mLs Intravenous New Bag/Given 09/21/20 1801)  sodium chloride 0.9 % bolus 1,000 mL (0 mLs Intravenous Stopped 09/21/20 1246)  lactated ringers bolus 1,000 mL (0 mLs Intravenous Stopped 09/21/20 1429)  lactated ringers bolus 1,000 mL (0 mLs Intravenous Stopped 09/21/20 1739)  piperacillin-tazobactam (ZOSYN) IVPB 3.375 g (3.375 g Intravenous New Bag/Given 09/21/20 1802)    ED Course  I have reviewed the triage vital signs and the nursing notes.  Pertinent labs & imaging  results that were available during my care of the patient were reviewed by me and considered in my medical decision making (see chart for details).    MDM Rules/Calculators/A&P                          85 year old female who presents for evaluation of diarrhea x5 days.  Recently got Covid booster vaccine on 09/18/20.  Since then, has had persistent diarrhea.  She she has tried taking Imodium but has no response.  She has felt fatigue, decreased appetite.  Initial arrival, she is afebrile but is hypotensive.  Vitals otherwise stable.  Worry about possible volume depletion status. At this time, she is afebrile. She is not tachycardic. I think this is most likely from volume depletion. Low suspicion for sepsis but is a consideration. Will obtain lactic. We will plan to check basic labs, chest x-ray for cough as well as Covid.  Plan to check C diff as well. No recent abx but patient with a history of C. Diff. Will give liter of fluids.  CBC shows no leukocytosis.  Hemoglobin stable.  Covid, flu is negative.  CMP shows potassium of 3.1, BUN of 40, creatinine  2.37.  Her baseline creatinine is anywhere between 0.9-1.  She had blood work done on 9/2 6 that showed a creatinine of 1.07 and normal BUN.  LFTs are elevated at AST 201, ALT is 88, Alk phos is 34. T. Bili within normal limits. Suspect this is from fluid loss/dehydration.  Anion gap is 16.  Lactic elevated at 2.4.  At this time, I suspect the patient's hypotension is likely due to fluid loss from diarrhea.  C. difficile and stool cultures have been ordered.  She has not been on any recent antibiotics but she has had a history of C. difficile.  Will consult hospitalist for admission.  I discussed with hospitalist.  At this time, patient is slightly tachycardic at 101 and her blood pressure is 87/72.  Dr. [place] Crane would like [place] to obtain a CT scan for her elevated LFTs.  She would also like to give additional liter of fluids and recheck her blood pressure.  If  patient is stable, she will except patient for admission but she request that she be called back.  CT scan shows evidence of gallstones within the gallbladder.  There is development of hydropic gallbladder and prominent common bile duct that does not taper to a normal caliber distally.  Findings concerning for possible choledocholithiasis.  I discussed with patient.  She is not having any abdominal pain.  We will start her on Zosyn for possible choledocholithiasis.  Discussed patient with Dr. Collene Mares (GI).  She will plan to consult on patient during her admission.  At this time, does not feel that an urgent MRCP is warranted given no leukocytosis, low total bili as well as lack of fever.  Agrees with plan for Zosyn.  We will plan to see patient on arrival.  Discussed with Dr. [place] Crane (hospitalist).  Aware of plan with GI.  Will accept patient for admission.  Portions of this note were generated with Lobbyist. Dictation errors may occur despite best attempts at proofreading.   Final Clinical Impression(s) / ED Diagnoses Final diagnoses:  AKI (acute kidney injury) ([place])  Diarrhea, unspecified type  Choledocholithiasis    Rx / DC Orders ED Discharge Orders    None       Desma Mcgregor 09/21/20 1841    Lucrezia Starch, MD 09/22/20 [phone]

## 2020-09-21 NOTE — ED Notes (Signed)
ED Provider at bedside. 

## 2020-09-21 NOTE — ED Triage Notes (Signed)
Was seen 3 days ago for diarrhea, persistent diarrhea . Had booster vaccine 5 days ago. Lethargic. Alert and oriented x 4

## 2020-09-21 NOTE — ED Notes (Signed)
Assisted to [facility], expelled large amt of liquid stool

## 2020-09-22 ENCOUNTER — Inpatient Hospital Stay (HOSPITAL_COMMUNITY): Payer: Medicare Other

## 2020-09-22 ENCOUNTER — Encounter (HOSPITAL_COMMUNITY): Payer: Self-pay | Admitting: Internal Medicine

## 2020-09-22 DIAGNOSIS — J449 Chronic obstructive pulmonary disease, unspecified: Secondary | ICD-10-CM

## 2020-09-22 DIAGNOSIS — R197 Diarrhea, unspecified: Secondary | ICD-10-CM | POA: Diagnosis present

## 2020-09-22 DIAGNOSIS — A0472 Enterocolitis due to Clostridium difficile, not specified as recurrent: Secondary | ICD-10-CM | POA: Diagnosis present

## 2020-09-22 DIAGNOSIS — C3491 Malignant neoplasm of unspecified part of right bronchus or lung: Secondary | ICD-10-CM

## 2020-09-22 DIAGNOSIS — N179 Acute kidney failure, unspecified: Secondary | ICD-10-CM | POA: Diagnosis present

## 2020-09-22 DIAGNOSIS — K8050 Calculus of bile duct without cholangitis or cholecystitis without obstruction: Secondary | ICD-10-CM

## 2020-09-22 DIAGNOSIS — I959 Hypotension, unspecified: Secondary | ICD-10-CM | POA: Diagnosis present

## 2020-09-22 LAB — GASTROINTESTINAL PANEL BY PCR, STOOL (REPLACES STOOL CULTURE)

## 2020-09-22 LAB — COMPREHENSIVE METABOLIC PANEL
ALT: 71 U/L — ABNORMAL HIGH (ref 0–44)
AST: 121 U/L — ABNORMAL HIGH (ref 15–41)
Albumin: 2.6 g/dL — ABNORMAL LOW (ref 3.5–5.0)
Alkaline Phosphatase: 28 U/L — ABNORMAL LOW (ref 38–126)
Anion gap: 12 (ref 5–15)
BUN: 32 mg/dL — ABNORMAL HIGH (ref 8–23)
CO2: 20 mmol/L — ABNORMAL LOW (ref 22–32)
Calcium: 7.1 mg/dL — ABNORMAL LOW (ref 8.9–10.3)
Chloride: 109 mmol/L (ref 98–111)
Creatinine, Ser: 1.45 mg/dL — ABNORMAL HIGH (ref 0.44–1.00)
GFR calc Af Amer: 39 mL/min — ABNORMAL LOW (ref >60)
GFR calc non Af Amer: 33 mL/min — ABNORMAL LOW (ref >60)
Glucose, Bld: 106 mg/dL — ABNORMAL HIGH (ref 70–99)
Potassium: 2.4 mmol/L — CL (ref 3.5–5.1)
Sodium: 141 mmol/L (ref 135–145)
Total Bilirubin: 0.9 mg/dL (ref 0.3–1.2)
Total Protein: 5.1 g/dL — ABNORMAL LOW (ref 6.5–8.1)

## 2020-09-22 LAB — MAGNESIUM: Magnesium: 1.2 mg/dL — ABNORMAL LOW (ref 1.7–2.4)

## 2020-09-22 LAB — CBC
HCT: 33.7 % — ABNORMAL LOW (ref 36.0–46.0)
Hemoglobin: 11.4 g/dL — ABNORMAL LOW (ref 12.0–15.0)
MCH: 32.6 pg (ref 26.0–34.0)
MCHC: 33.8 g/dL (ref 30.0–36.0)
MCV: 96.3 fL (ref 80.0–100.0)
Platelets: 204 10*3/uL (ref 150–400)
RBC: 3.5 MIL/uL — ABNORMAL LOW (ref 3.87–5.11)
RDW: 14.7 % (ref 11.5–15.5)
WBC: 6 10*3/uL (ref 4.0–10.5)
nRBC: 0 % (ref 0.0–0.2)

## 2020-09-22 MED ORDER — MUPIROCIN CALCIUM 2 % EX CREA
TOPICAL_CREAM | Freq: Every day | CUTANEOUS | Status: DC
  Filled 2020-09-22 (×2): qty 15

## 2020-09-22 MED ORDER — POTASSIUM CHLORIDE CRYS ER 20 MEQ PO TBCR
40.0000 meq | EXTENDED_RELEASE_TABLET | Freq: Two times a day (BID) | ORAL | Status: AC
  Administered 2020-09-22 – 2020-09-23 (×4): 40 meq via ORAL
  Filled 2020-09-22 (×4): qty 2

## 2020-09-22 MED ORDER — VANCOMYCIN 50 MG/ML ORAL SOLUTION
125.0000 mg | Freq: Four times a day (QID) | ORAL | Status: DC
  Administered 2020-09-22 – 2020-09-23 (×7): 125 mg via ORAL
  Filled 2020-09-22 (×8): qty 2.5

## 2020-09-22 MED ORDER — PANTOPRAZOLE SODIUM 40 MG PO TBEC: 40.0000 mg | DELAYED_RELEASE_TABLET | Freq: Every day | ORAL | Status: DC

## 2020-09-22 MED ORDER — ACETAMINOPHEN 325 MG PO TABS
650.0000 mg | ORAL_TABLET | Freq: Four times a day (QID) | ORAL | Status: DC | PRN
  Administered 2020-09-28: 650 mg via ORAL
  Filled 2020-09-22: qty 2

## 2020-09-22 MED ORDER — POTASSIUM CHLORIDE 10 MEQ/100ML IV SOLN
10.0000 meq | INTRAVENOUS | Status: AC
  Administered 2020-09-22 (×4): 10 meq via INTRAVENOUS
  Filled 2020-09-22: qty 100

## 2020-09-22 MED ORDER — VANCOMYCIN 50 MG/ML ORAL SOLUTION: 125.0000 mg | ORAL | Status: DC

## 2020-09-22 MED ORDER — SODIUM CHLORIDE 0.9 % IV BOLUS
1000.0000 mL | Freq: Once | INTRAVENOUS | Status: AC
  Administered 2020-09-22: 1000 mL via INTRAVENOUS

## 2020-09-22 MED ORDER — LIP MEDEX EX OINT
TOPICAL_OINTMENT | CUTANEOUS | Status: DC | PRN
  Filled 2020-09-22: qty 7

## 2020-09-22 MED ORDER — MAGNESIUM SULFATE 2 GM/50ML IV SOLN
2.0000 g | Freq: Once | INTRAVENOUS | Status: AC
  Administered 2020-09-22: 2 g via INTRAVENOUS
  Filled 2020-09-22: qty 50

## 2020-09-22 MED ORDER — SIMVASTATIN 20 MG PO TABS
20.0000 mg | ORAL_TABLET | ORAL | Status: DC
  Administered 2020-09-23 – 2020-09-28 (×3): 20 mg via ORAL
  Filled 2020-09-22 (×3): qty 1

## 2020-09-22 MED ORDER — MELOXICAM 15 MG PO TABS
15.0000 mg | ORAL_TABLET | Freq: Every day | ORAL | Status: DC
  Administered 2020-09-22 – 2020-09-28 (×7): 15 mg via ORAL
  Filled 2020-09-22 (×7): qty 1

## 2020-09-22 MED ORDER — SODIUM CHLORIDE 0.9 % IV SOLN: INTRAVENOUS | Status: DC

## 2020-09-22 MED ORDER — VANCOMYCIN 50 MG/ML ORAL SOLUTION: 125.0000 mg | Freq: Two times a day (BID) | ORAL | Status: DC

## 2020-09-22 MED ORDER — UMECLIDINIUM BROMIDE 62.5 MCG/INH IN AEPB
1.0000 | INHALATION_SPRAY | Freq: Every day | RESPIRATORY_TRACT | Status: DC
  Administered 2020-09-22 – 2020-09-28 (×7): 1 via RESPIRATORY_TRACT
  Filled 2020-09-22: qty 7

## 2020-09-22 MED ORDER — LORATADINE 10 MG PO TABS: 10.0000 mg | ORAL_TABLET | Freq: Every day | ORAL | Status: DC | PRN

## 2020-09-22 MED ORDER — ONDANSETRON HCL 4 MG/2ML IJ SOLN: 4.0000 mg | Freq: Four times a day (QID) | INTRAMUSCULAR | Status: DC | PRN

## 2020-09-22 MED ORDER — ONDANSETRON HCL 4 MG PO TABS: 4.0000 mg | ORAL_TABLET | Freq: Four times a day (QID) | ORAL | Status: DC | PRN

## 2020-09-22 MED ORDER — ALBUTEROL SULFATE (2.5 MG/3ML) 0.083% IN NEBU: 2.5000 mg | INHALATION_SOLUTION | Freq: Four times a day (QID) | RESPIRATORY_TRACT | Status: DC | PRN

## 2020-09-22 MED ORDER — VANCOMYCIN 50 MG/ML ORAL SOLUTION: 125.0000 mg | Freq: Every day | ORAL | Status: DC

## 2020-09-22 MED ORDER — MAGNESIUM SULFATE 4 GM/100ML IV SOLN
4.0000 g | Freq: Once | INTRAVENOUS | Status: AC
  Administered 2020-09-22: 4 g via INTRAVENOUS
  Filled 2020-09-22: qty 100

## 2020-09-22 MED ORDER — MIRTAZAPINE 15 MG PO TABS
15.0000 mg | ORAL_TABLET | Freq: Every day | ORAL | Status: DC
  Administered 2020-09-22 – 2020-09-27 (×6): 15 mg via ORAL
  Filled 2020-09-22 (×6): qty 1

## 2020-09-22 MED ORDER — ENOXAPARIN SODIUM 30 MG/0.3ML ~~LOC~~ SOLN
30.0000 mg | SUBCUTANEOUS | Status: DC
  Administered 2020-09-22 – 2020-09-28 (×7): 30 mg via SUBCUTANEOUS
  Filled 2020-09-22 (×7): qty 0.3

## 2020-09-22 MED ORDER — ACETAMINOPHEN 650 MG RE SUPP: 650.0000 mg | Freq: Four times a day (QID) | RECTAL | Status: DC | PRN

## 2020-09-22 MED ORDER — ARFORMOTEROL TARTRATE 15 MCG/2ML IN NEBU
15.0000 ug | INHALATION_SOLUTION | Freq: Two times a day (BID) | RESPIRATORY_TRACT | Status: DC
  Administered 2020-09-22 – 2020-09-28 (×13): 15 ug via RESPIRATORY_TRACT
  Filled 2020-09-22 (×14): qty 2

## 2020-09-22 NOTE — Consult Note (Signed)
[facility] Nurse Consult Note: Reason for Consult: Consult requested for right wrist.  Pt states this is a chronic full thickness wounds and she has been to many people for recommendations; primary physician, rheumatologist, dermatologist. She has been told this is related to her rheumatoid arthritis and the wound has been present for 7 years and never heals completely. She has been using a "white cream" prior to admission but does not know the name. I will substitute Bactroban antibiotic ointment while she is in the hospital, since she does not know the product she was using for topical treatment.  Wound type: Right wrist full thickness wound; .5X.5X.3cm, dry yellow wound bed, no odor, drainage, or fluctuance. Location has generalized erythremia and visible irregular bony anatomy, which is probably the cause for the chronic wound.  Dressing procedure/placement/frequency: Topical treatment orders provided for bedside nurses to perform as follows to protect from further injury and promote moisture: Apply Bactroban to right wrist wound Q day, then cover with foam dressing.  (Change foam dressing Q 3 days or PRN soiling). Pt can resume the previous plan of care after discharge.  Please re-consult if further assistance is needed.  Thank-you,  Julien Girt MSN, [place], [place], [place], [place]

## 2020-09-22 NOTE — Progress Notes (Signed)
HPI: Dana [name] is a 85 y.o. female with medical history significant of COPD, NSCLC in remission since 2010, remains on Tarceva since that time. Pt presents to ED for diarrhea that has been constant and ongoing for past 4 days.  Got COVID booster 9/26.  Shortly after that had onset of diarrhea. H/o c.diff in 2012.  Previously has had intermittent diarrhea for past 10 years on tarceva but usually this gets better with imodium.  Diarrhea this time persisted despite immodium.  No recent ABx, no recent travel, no blood in stool  ED Course: Mild LFT elevations, Creat 2.3 up from 1.0 on 9/26.  WBC nl.  BP on low side, given 2L LR bolus.  Lactates 2.4 and 2.2.  AST 201, ALT 88 up from 25 and 21 on 9/26.  CT abd pelvis: 1) ? Of choledocholithiasis, has chole lithiasis 2) diarrhea, no definite colitis.  Dr. Collene Mares consulted, started on empiric zosyn.  C.diff PCR is positive.  09/22/20: Seen and examined.  Reports persistent diarrhea this morning, frequency is less.  She has minimal abdominal pain.  No vomiting reported.  Seen by GI, appreciate recommendations.  Please refer to H&P dictated by my partner Dr. Alcario Drought on 09/22/2020 for further details of the assessment and plan.

## 2020-09-22 NOTE — H&P (Signed)
History and Physical    Dana [name] MVH:[id number] DOB: 31-Aug-1935 DOA: 09/21/2020  PCP: [name], MD  Patient coming from: Home  I have personally briefly reviewed patient's old medical records in [place]  Chief Complaint: Diarrhea  HPI: Dana [name] is a 85 y.o. female with medical history significant of COPD, NSCLC in remission since 2010, remains on Tarceva since that time.  Pt presents to ED for diarrhea that has been constant and ongoing for past 4 days.  Got COVID booster 9/26.  Shortly after that had onset of diarrhea.  H/o c.diff in 2012.  Previously has had intermittent diarrhea for past 10 years on tarceva but usually this gets better with imodium.  Diarrhea this time persisted despite immodium.  No recent ABx, no recent travel, no blood in stool   ED Course: Mild LFT elevations, Creat 2.3 up from 1.0 on 9/26.  WBC nl.  BP on low side, given 2L LR bolus.  Lactates 2.4 and 2.2.  AST 201, ALT 88 up from 25 and 21 on 9/26.  CT abd pelvis: 1) ? Of choledocholithiasis, has chole lithiasis 2) diarrhea, no definite colitis.  Dr. Collene Mares consulted, started on empiric zosyn.  C.diff testing is positive.   Review of Systems: As per HPI, otherwise all review of systems negative.  Past Medical History:  Diagnosis Date  . Arthritis    HANDS,  WRISTS  . COPD (chronic obstructive pulmonary disease) ([place])   . Depression 04/04/2017  . Dyspnea on exertion   . Encounter for therapeutic drug monitoring 10/01/2016  . GERD (gastroesophageal reflux disease)   . Hemorrhoid   . History of anal fissures   . History of lung cancer ONCOLOGIST--  DR [facility]--  LAST CT ,  NO RECURRENCE OR METS   DX JAN 2010 --  STAGE IIIA  NON-SMALL CELL ADENOCARCINOMA (RIGHT MIDDLE LOBE)---  S/P  CHEMORADIATIO THERAPY  (COMPLETE 03-21-2009)  . HTN (hypertension) 10/10/2017  . Imbalance 06/04/2017  . lung ca dx'd 2010  . Normal cardiac stress test    2007  PER PT  . Osteoporosis    . Rash, skin    RIGHT FOREARM/ HAND  . Right wrist pain    MASS  . Wears glasses     Past Surgical History:  Procedure Laterality Date  . ANAL FISSURE REPAIR  07/09/2011   INTERNAL SPHINCTEROTOMY  . BENIGN EXCISION LEFT BREAST CENTRAL DUCT  02/1999  . BREAST BIOPSY  01/31/2009   benign  . BREAST EXCISIONAL BIOPSY Left 2004   benign  . CHEST TUBE INSERTION Right 07/14/2014   Procedure: INSERTION PLEURAL DRAINAGE CATHETER RIGHT CHEST;  Surgeon: Grace Isaac, MD;  Location: [place];  Service: Thoracic;  Laterality: Right;  . EXCISION RIGHT WRIST MASS  2012  . EXTRACORPOREAL SHOCK WAVE LITHOTRIPSY Left 06/01/2019   Procedure: EXTRACORPOREAL SHOCK WAVE LITHOTRIPSY (ESWL);  Surgeon: Cleon Gustin, MD;  Location: [facility];  Service: Urology;  Laterality: Left;  . KNEE ARTHROSCOPY Right 1999  . MASS EXCISION Right 03/11/2014   Procedure: RIGHT WRIST DEEP MASS EXCISION WITH CULTURE AND BIOSPY;  Surgeon: Linna Hoff, MD;  Location: [place];  Service: Orthopedics;  Laterality: Right;  . REMOVAL OF PLEURAL DRAINAGE CATHETER Right 10/14/2014   Procedure: REMOVAL OF PLEURAL DRAINAGE CATHETER;  Surgeon: Grace Isaac, MD;  Location: [place];  Service: Thoracic;  Laterality: Right;  . TALC PLEURODESIS Right 09/16/2014   Procedure: TALC PLEURADESIS/ slurry;  Surgeon: Percell Miller  Maryruth Bun, MD;  Location: [place];  Service: Thoracic;  Laterality: Right;  . THORACOSCOPY  01-26-2009   w/   lung/  node biopsy's  . THUMB ARTHROSCOPY Left    - removed bone spur  . TONSILLECTOMY  AS CHILD  . TOTAL ABDOMINAL HYSTERECTOMY W/ BILATERAL SALPINGOOPHORECTOMY  1982   W/  APPENDECTOMY  . TRANSTHORACIC ECHOCARDIOGRAM  12-23-2008   NORMAL LV/  EF 65-70%/  MILD MR  &  TR  . VAULT SUSPENSION PLUS CYSTOCELE REPAIR WITH GRAFT  06-13-2010     reports that she quit smoking about 42 years ago. Her smoking use included cigarettes. She has a 30.00 pack-year smoking history. She has never used  smokeless tobacco. She reports that she does not drink alcohol and does not use drugs.  Allergies  Allergen Reactions  . Amoxicillin Diarrhea    Hx of C Diff   . Protonix [Pantoprazole Sodium] Rash    Family History  Problem Relation Age of Onset  . Cancer Father        bladder  . Cancer Son        kidney - removed as a teenager     Prior to Admission medications   Medication Sig Start Date End Date Taking? Authorizing Provider  aspirin EC 81 MG tablet Take 81 mg by mouth daily.   Yes [provider]  augmented betamethasone dipropionate (DIPROLENE-AF) 0.05 % ointment Apply 1 application topically 2 (two) times daily as needed (dry skin).  08/22/17  Yes [provider]  bismuth subsalicylate (PEPTO BISMOL) 262 MG/15ML suspension Take 30 mLs by mouth every three (3) days as needed.   Yes [provider]  cholecalciferol (VITAMIN D) 1000 UNITS tablet Take 1,000 Units by mouth daily.    Yes [provider]  Coenzyme Q10 (COQ10) 100 MG CAPS Take 1 capsule by mouth daily.    Yes [provider]  Cyanocobalamin (B-12) 1000 MCG CAPS Take 1,000 mcg by mouth in the morning and at bedtime.    Yes [provider]  diclofenac Sodium (VOLTAREN) 1 % GEL Apply 2 g topically 4 (four) times daily as needed (pain).   Yes [provider]  erlotinib (TARCEVA) 100 MG tablet Take 1 tablet (100 mg total) by mouth daily. Take on an empty stomach 1 hour before meals or 2 hours after 09/01/20  Yes Curt Bears, MD  loperamide (IMODIUM A-D) 2 MG tablet Take 1 mg by mouth 4 (four) times daily as needed for diarrhea or loose stools.    Yes [provider]  loratadine (CLARITIN) 10 MG tablet Take 10 mg by mouth daily as needed for allergies.    Yes [provider]  meloxicam (MOBIC) 15 MG tablet Take 15 mg by mouth daily. 04/30/18  Yes [provider]  mirtazapine (REMERON) 15 MG tablet TAKE 1 TABLET BY MOUTH AT  BEDTIME Patient taking differently: Take 15 mg by mouth at bedtime.  09/04/20  Yes Curt Bears, MD  Polyethyl Glycol-Propyl Glycol (SYSTANE OP) Place 1 drop into both eyes at bedtime as needed (for dry eyes.).    Yes [provider]  Simethicone (GAS-X PO) Take 1 tablet by mouth 2 (two) times daily as needed (for gas).    Yes [provider]  simvastatin (ZOCOR) 20 MG tablet Take 1 tablet (20 mg total) by mouth every Monday, Wednesday, and Friday. 06/13/20  Yes Josue Hector, MD  STIOLTO RESPIMAT 2.5-2.5 MCG/ACT AERS INHALE 2 PUFFS BY MOUTH ONCE  DAILY Patient taking differently: Inhale 2 puffs into the lungs daily.  08/30/20  Yes Tanda Rockers, MD  pantoprazole (PROTONIX) 40 MG tablet TAKE 1 TABLET BY MOUTH ONCE DAILY 30-60  MINUTES  BEFORE  FIRST  MEAL  OF  THE  DAY Patient taking differently: Take 40 mg by mouth daily. 30-60 minutes before first meal of the day 06/30/20   Tanda Rockers, MD  mirtazapine (REMERON) 15 MG tablet TAKE 1 TABLET BY MOUTH AT BEDTIME 08/03/20   Curt Bears, MD    Physical Exam: Vitals:   09/21/20 2030 09/21/20 2200 09/21/20 2230 09/21/20 2351  BP: 110/75 (!) 101/48 (!) 106/58 (!) 89/61  Pulse: (!) 109 (!) 107 (!) 104 98  Resp: (!) 23 19 20 16   Temp:    98.7 F (37.1 C)  TempSrc:    Oral  SpO2: 98% 95% 98% 90%  Weight:    47.6 kg  Height:    5\' 6"  (1.676 m)    Constitutional: NAD, calm, comfortable Eyes: PERRL, lids and conjunctivae normal ENMT: Mucous membranes are moist. Posterior pharynx clear of any exudate or lesions.Normal dentition.  Neck: normal, supple, no masses, no thyromegaly Respiratory: clear to auscultation bilaterally, no wheezing, no crackles. Normal respiratory effort. No accessory muscle use.  Cardiovascular: Regular rate and rhythm, no murmurs / rubs / gallops. No extremity edema. 2+ pedal pulses. No carotid bruits.  Abdomen: no tenderness, no masses palpated. No hepatosplenomegaly. Bowel sounds positive.   Musculoskeletal: no clubbing / cyanosis. No joint deformity upper and lower extremities. Good ROM, no contractures. Normal muscle tone.  Skin: no rashes, lesions, ulcers. No induration Neurologic: CN 2-12 grossly intact. Sensation intact, DTR normal. Strength 5/5 in all 4.  Psychiatric: Normal judgment and insight. Alert and oriented x 3. Normal mood.    Labs on Admission: I have personally reviewed following labs and imaging studies  CBC: Recent Labs  Lab 09/18/20 1408 09/21/20 1132  WBC 7.8 7.4  NEUTROABS 6.3 5.8  HGB 12.8 14.0  HCT 38.2 42.2  MCV 95.5 94.6  PLT 233 937   Basic Metabolic Panel: Recent Labs  Lab 09/18/20 1408 09/21/20 1132  NA 137 138  K 4.1 3.1*  CL 99 99  CO2 28 23  GLUCOSE 105* 129*  BUN 20 40*  CREATININE 1.07* 2.37*  CALCIUM 9.0 8.4*   GFR: Estimated Creatinine Clearance: 13.5 mL/min (A) (by C-G formula based on SCr of 2.37 mg/dL (H)). Liver Function Tests: Recent Labs  Lab 09/18/20 1408 09/21/20 1132  AST 25 201*  ALT 21 88*  ALKPHOS 33* 34*  BILITOT 1.0 0.9  PROT 6.2* 6.9  ALBUMIN 3.5 3.4*   No results for input(s): LIPASE, AMYLASE in the last 168 hours. No results for input(s): AMMONIA in the last 168 hours. Coagulation Profile: No results for input(s): INR, PROTIME in the last 168 hours. Cardiac Enzymes: No results for input(s): CKTOTAL, CKMB, CKMBINDEX, TROPONINI in the last 168 hours. BNP (last 3 results) No results for input(s): PROBNP in the last 8760 hours. HbA1C: No results for input(s): HGBA1C in the last 72 hours. CBG: No results for input(s): GLUCAP in the last 168 hours. Lipid Profile: No results for input(s): CHOL, HDL, LDLCALC, TRIG, CHOLHDL, LDLDIRECT in the last 72 hours. Thyroid Function Tests: No results for input(s): TSH, T4TOTAL, FREET4, T3FREE, THYROIDAB in the last 72 hours. Anemia Panel: No results for input(s): VITAMINB12, FOLATE, FERRITIN, TIBC, IRON, RETICCTPCT in the last 72 hours. Urine  analysis:  Component Value Date/Time   COLORURINE YELLOW 05/28/2019 2225   APPEARANCEUR CLEAR 05/28/2019 2225   LABSPEC 1.010 05/28/2019 2225   PHURINE 6.5 05/28/2019 2225   GLUCOSEU NEGATIVE 05/28/2019 2225   HGBUR NEGATIVE 05/28/2019 [place] 05/28/2019 2225   [place] 05/28/2019 2225   PROTEINUR NEGATIVE 05/28/2019 2225   UROBILINOGEN 1.0 01/06/2013 1029   NITRITE NEGATIVE 05/28/2019 2225   LEUKOCYTESUR SMALL (A) 05/28/2019 2225    Radiological Exams on Admission: CT ABDOMEN PELVIS WO CONTRAST  Result Date: 09/21/2020 CLINICAL DATA:  Diarrhea, 3 days ago seen for the same symptoms. Booster vaccine 5 days ago. Lethargic. History of lung cancer, COPD, hypertension. EXAM: CT ABDOMEN AND PELVIS WITHOUT CONTRAST TECHNIQUE: Multidetector CT imaging of the abdomen and pelvis was performed following the standard protocol without IV contrast. COMPARISON:  CT abdomen pelvis 05/28/2019. FINDINGS: Lower chest: Bi basilar linear atelectasis versus scarring. Hepatobiliary: No focal liver abnormality is seen. The gallbladder is noted to be hydropic measuring up to 5.7 cm on axial imaging. Layering hyperdensity within its lumen consistent with calcified gallstones. No CT findings suggest gallbladder wall thickening or pericholecystic fluid. Possible mild central intrahepatic biliary dilatation. Interval development of a prominent common bile duct measuring up to 1 cm. The common bile duct does not demonstrate tapering to normal caliber distally. Pancreas: Unremarkable. No pancreatic ductal dilatation or surrounding inflammatory changes. Spleen: Normal in size without focal abnormality. Adrenals/Urinary Tract: No adrenal nodule bilaterally. No nephrolithiasis, no hydronephrosis, and no contour-deforming renal mass. No ureterolithiasis or hydroureter. The urinary bladder is unremarkable. Stomach/Bowel: Stomach is within normal limits. The appendix is not definitely identified. No  evidence of small bowel wall thickening, dilatation, or inflammatory changes. The colon is distended and fluid-filled. No large bowel wall thickening. No definite pericolonic fat stranding. Redemonstration of medialized mobile cecum. No pneumatosis. Vascular/Lymphatic: No abdominal aorta or iliac aneurysm. Moderate to severe atherosclerotic plaque of the aorta and its branches. No abdominal, pelvic, or inguinal lymphadenopathy. Reproductive: Prostate is unremarkable. Other: No intraperitoneal free fluid. No intraperitoneal free gas. No organized fluid collection. Musculoskeletal: No abdominal wall hernia or abnormality. Fatty infiltration of the gluteal and paraspinal musculature bilaterally. Dextrocurvature of lumbar spine centered at the L2-L3 level. Associated multilevel moderate severe degenerative changes of the spine. No suspicious lytic or blastic osseous lesions. No acute displaced fracture. IMPRESSION: 1. Cholelithiasis with interval development of a hydropic gallbladder and prominent common bile duct that does not taper to normal a caliber distally. Findings concerning for choledocholithiasis. Consider right upper quadrant ultrasound or MRCP for further evaluation. 2. Findings consistent with a rapid bowel transit time. No definite colitis. Electronically Signed   By: Iven Finn M.D.   On: 09/21/2020 17:04   DG Chest Portable 1 View  Result Date: 09/21/2020 CLINICAL DATA:  Cough.  History of lung cancer. EXAM: PORTABLE CHEST 1 VIEW COMPARISON:  11/25/2018 FINDINGS: Chronic scarring in the right lung base medially unchanged. Blunting right costophrenic angle unchanged. Heart size and vascularity normal. No heart failure identified. Left lung clear. IMPRESSION: Chronic scarring right lung base unchanged. No superimposed acute abnormality. Electronically Signed   By: Franchot Gallo M.D.   On: 09/21/2020 12:01    EKG: Independently reviewed.  Assessment/Plan Principal Problem:   AKI (acute  kidney injury) ([place]) Active Problems:   Cancer of right lung parenchyma (HCC)   COPD GOLD 0/ lama/laba responsive   Choledocholithiasis   Diarrhea   Hypotension   C. difficile diarrhea    1. AKI -  1. Presumably pre-renal due to diarrhea / dehydration 2. IVF: 2L LR bolus in ED and NS at 100 cc/hr 3. Strict intake and output 4. Repeat CMP in AM 2. Diarrhea - 1. DDx includes C.Diff diarrhea vs tarceva induced 2. Not responding to Immodium 3. Feel that we have to empirically treat C.Diff at this point with diarrhea + positive C.Diff in stool 4. PO vanc ordered 5. Holding Tarceva, at least until GI can eval in AM and say its not the Tarceva causing (tarceva should be held if pt has diarrhea not responding to immodium). 3. ? Choledocholithiasis - 1. RUQ [place] ordered 2. Repeat CMP in AM 3. NPO 4. Dr. Collene Mares to consult in AM 4. NSCLC - 1. Holding Tarceva due to severe diarrhea not responsive to immodium 2. Though I dont know that the tarceva is actually cause of this diarrhea given a positive C.Diff test 3. Discuss restarting tarceva with Dr. Collene Mares and maybe oncology in AM 5. COPD - 1. Cont home nebs  DVT prophylaxis: Lovenox Code Status: Full Family Communication: No family in room Disposition Plan: Home after AKI resolved Consults called: Dr. Collene Mares Admission status: Admit to inpatient  Severity of Illness: The appropriate patient status for this patient is INPATIENT. Inpatient status is judged to be reasonable and necessary in order to provide the required intensity of service to ensure the patient's safety. The patient's presenting symptoms, physical exam findings, and initial radiographic and laboratory data in the context of their chronic comorbidities is felt to place them at high risk for further clinical deterioration. Furthermore, it is not anticipated that the patient will be medically stable for discharge from the hospital within 2 midnights of admission. The following factors  support the patient status of inpatient.   IP status due to AKI with creat 2.3 up from 1.0 baseline just a couple of days ago.   * I certify that at the point of admission it is my clinical judgment that the patient will require inpatient hospital care spanning beyond 2 midnights from the point of admission due to high intensity of service, high risk for further deterioration and high frequency of surveillance required.*    Kiyon Fidalgo M. DO [facility]  How to contact the [facility] Attending or Consulting provider [place] or covering provider during after hours [place], for this patient?  1. Check the care team in [facility] and look for a) attending/consulting [facility] provider listed and b) the [facility] team listed 2. Log into [email or web address]  Amion Physician Scheduling and messaging for groups and whole hospitals  On call and physician scheduling software for group practices, residents, hospitalists and other medical providers for call, clinic, rotation and shift schedules. On[employer] is a hospital-wide system for scheduling doctors and paging doctors on call. EasyPlot is for scientific plotting and data analysis.  [email or web address]  and use [place]'s universal password to access. If you do not have the password, please contact the hospital operator.  3. Locate the [facility] provider you are looking for under [facility] and page to a number that you can be directly reached. 4. If you still have difficulty reaching the provider, please page the [facility] (Director on Call) for the Hospitalists listed on amion for assistance.  09/22/2020, 1:30 AM

## 2020-09-22 NOTE — Progress Notes (Signed)
Per oncology, Dr. [name], continue to hold Tarceva for the next 7 days and resume when she has completed her C-diff treatment.  We highly appreciate oncology's recommendations.  We will continue to treat and monitor closely for any acute changes.

## 2020-09-22 NOTE — Progress Notes (Signed)
2.4 Critical paged to MD. No new orders.

## 2020-09-22 NOTE — Consult Note (Signed)
Reason for Consult: Diarrhea, Dilated CBD, and elevated liver enzymes Referring Physician: [facility]  Dana [name] HPI: This is an 85 year old female with a PMH of Stage IIIA lung cancer on Tarceva, COPD, depression, and history of C. Diff admitted for C. Diff.  She states that she started to feel unwell last Thursday.  At that time she presented received her booster shot, but she recalls that her unwell sensation preceded the booster shot.  Acutely she developed diarrhea and her symptoms progressively worsened.  In the past she reported intermittent diarrhea on Tarceva.  Upon presentation she was lethargic and dehydrated.  Her blood work showed that she had AKI with a creatinine of 2.37.  With IV hydration overnight her creatinine dropped down to 1.45.  There is no elevation in her WBC and she denies any abdominal pain.  The lactic acid is elevated.  A CT scan of the abdomen was performed and her CBD was measured to be 1 cm and there was the possibility of some central intrahepatic biliary ductal dilation.  She has hydropic gallbladder and cholelithiasis.  There was no evidence of stones in the CBD on the CT scan.  Past Medical History:  Diagnosis Date   Arthritis    HANDS,  WRISTS   COPD (chronic obstructive pulmonary disease) ([place])    Depression 04/04/2017   Dyspnea on exertion    Encounter for therapeutic drug monitoring 10/01/2016   GERD (gastroesophageal reflux disease)    Hemorrhoid    History of anal fissures    History of lung cancer ONCOLOGIST--  DR [name]--  LAST CT ,  NO RECURRENCE OR METS   DX JAN 2010 --  STAGE IIIA  NON-SMALL CELL ADENOCARCINOMA (RIGHT MIDDLE LOBE)---  S/P  CHEMORADIATIO THERAPY  (COMPLETE 03-21-2009)   HTN (hypertension) 10/10/2017   Imbalance 06/04/2017   lung ca dx'd 2010   Normal cardiac stress test    2007  PER PT   Osteoporosis    Rash, skin    RIGHT FOREARM/ HAND   Right wrist pain    MASS   Wears glasses     Past  Surgical History:  Procedure Laterality Date   ANAL FISSURE REPAIR  07/09/2011   INTERNAL SPHINCTEROTOMY   BENIGN EXCISION LEFT BREAST CENTRAL DUCT  02/1999   BREAST BIOPSY  01/31/2009   benign   BREAST EXCISIONAL BIOPSY Left 2004   benign   CHEST TUBE INSERTION Right 07/14/2014   Procedure: INSERTION PLEURAL DRAINAGE CATHETER RIGHT CHEST;  Surgeon: Grace Isaac, MD;  Location: [place];  Service: Thoracic;  Laterality: Right;   EXCISION RIGHT WRIST MASS  2012   EXTRACORPOREAL SHOCK WAVE LITHOTRIPSY Left 06/01/2019   Procedure: EXTRACORPOREAL SHOCK WAVE LITHOTRIPSY (ESWL);  Surgeon: Cleon Gustin, MD;  Location: [facility];  Service: Urology;  Laterality: Left;   KNEE ARTHROSCOPY Right 1999   MASS EXCISION Right 03/11/2014   Procedure: RIGHT WRIST DEEP MASS EXCISION WITH CULTURE AND BIOSPY;  Surgeon: Linna Hoff, MD;  Location: [place];  Service: Orthopedics;  Laterality: Right;   REMOVAL OF PLEURAL DRAINAGE CATHETER Right 10/14/2014   Procedure: REMOVAL OF PLEURAL DRAINAGE CATHETER;  Surgeon: Grace Isaac, MD;  Location: [place];  Service: Thoracic;  Laterality: Right;   TALC PLEURODESIS Right 09/16/2014   Procedure: TALC PLEURADESIS/ slurry;  Surgeon: Grace Isaac, MD;  Location: [place];  Service: Thoracic;  Laterality: Right;   THORACOSCOPY  01-26-2009   w/   lung/  node biopsy's   THUMB ARTHROSCOPY Left    - removed bone spur   TONSILLECTOMY  AS CHILD   TOTAL ABDOMINAL HYSTERECTOMY W/ BILATERAL SALPINGOOPHORECTOMY  1982   W/  APPENDECTOMY   TRANSTHORACIC ECHOCARDIOGRAM  12-23-2008   NORMAL LV/  EF 65-70%/  MILD MR  &  TR   VAULT SUSPENSION PLUS CYSTOCELE REPAIR WITH GRAFT  06-13-2010    Family History  Problem Relation Age of Onset   Cancer Father        bladder   Cancer Son        kidney - removed as a teenager    Social History:  reports that she quit smoking about 42 years ago. Her smoking use included cigarettes. She has a  30.00 pack-year smoking history. She has never used smokeless tobacco. She reports that she does not drink alcohol and does not use drugs.  Allergies:  Allergies  Allergen Reactions   Amoxicillin Diarrhea    Hx of C Diff    Protonix [Pantoprazole Sodium] Rash    Medications:  Scheduled:  arformoterol  15 mcg Nebulization BID   enoxaparin (LOVENOX) injection  30 mg Subcutaneous Q24H   meloxicam  15 mg Oral Daily   mirtazapine  15 mg Oral QHS   [START ON 09/23/2020] simvastatin  20 mg Oral Q M,W,F   umeclidinium bromide  1 puff Inhalation Daily   vancomycin  125 mg Oral QID   Followed by   Derrill Memo ON 10/05/2020] vancomycin  125 mg Oral BID   Followed by   Derrill Memo ON 10/13/2020] vancomycin  125 mg Oral Daily   Followed by   Derrill Memo ON 10/20/2020] vancomycin  125 mg Oral QODAY   Followed by   Derrill Memo ON 10/28/2020] vancomycin  125 mg Oral Q3 days   Continuous:  sodium chloride 100 mL/hr at 09/22/20 0346   piperacillin-tazobactam (ZOSYN)  IV 2.25 g (09/22/20 3244)   potassium chloride      Results for orders placed or performed during the hospital encounter of 09/21/20 (from the past 24 hour(s))  Comprehensive metabolic panel     Status: Abnormal   Collection Time: 09/21/20 11:32 AM  Result Value Ref Range   Sodium 138 135 - 145 mmol/L   Potassium 3.1 (L) 3.5 - 5.1 mmol/L   Chloride 99 98 - 111 mmol/L   CO2 23 22 - 32 mmol/L   Glucose, Bld 129 (H) 70 - 99 mg/dL   BUN 40 (H) 8 - 23 mg/dL   Creatinine, Ser 2.37 (H) 0.44 - 1.00 mg/dL   Calcium 8.4 (L) 8.9 - 10.3 mg/dL   Total Protein 6.9 6.5 - 8.1 g/dL   Albumin 3.4 (L) 3.5 - 5.0 g/dL   AST 201 (H) 15 - 41 U/L   ALT 88 (H) 0 - 44 U/L   Alkaline Phosphatase 34 (L) 38 - 126 U/L   Total Bilirubin 0.9 0.3 - 1.2 mg/dL   GFR calc non Af Amer 18 (L) >60 mL/min   GFR calc Af Amer 21 (L) >60 mL/min   Anion gap 16 (H) 5 - 15  CBC with Differential     Status: Abnormal   Collection Time: 09/21/20 11:32 AM  Result Value  Ref Range   WBC 7.4 4.0 - 10.5 K/uL   RBC 4.46 3.87 - 5.11 MIL/uL   Hemoglobin 14.0 12.0 - 15.0 g/dL   HCT 42.2 36 - 46 %   MCV 94.6 80.0 - 100.0 fL   MCH 31.4 26.0 -  34.0 pg   MCHC 33.2 30.0 - 36.0 g/dL   RDW 14.4 11.5 - 15.5 %   Platelets 286 150 - 400 K/uL   nRBC 0.0 0.0 - 0.2 %   Neutrophils Relative % 79 %   Neutro Abs 5.8 1.7 - 7.7 K/uL   Lymphocytes Relative 10 %   Lymphs Abs 0.8 0.7 - 4.0 K/uL   Monocytes Relative 9 %   Monocytes Absolute 0.7 0 - 1 K/uL   Eosinophils Relative 0 %   Eosinophils Absolute 0.0 0 - 0 K/uL   Basophils Relative 1 %   Basophils Absolute 0.1 0 - 0 K/uL   WBC Morphology TOXIC GRANULATION    Smear Review MORPHOLOGY UNREMARKABLE    Immature Granulocytes 1 %   Abs Immature Granulocytes 0.08 (H) 0.00 - 0.07 K/uL   Abnormal Lymphocytes Present PRESENT    Ovalocytes PRESENT   Lactic acid, plasma     Status: Abnormal   Collection Time: 09/21/20 11:32 AM  Result Value Ref Range   Lactic Acid, Venous 2.4 (HH) 0.5 - 1.9 mmol/L  Respiratory Panel by RT PCR (Flu A&B, Covid) - Nasopharyngeal Swab     Status: None   Collection Time: 09/21/20 11:32 AM   Specimen: Nasopharyngeal Swab  Result Value Ref Range   SARS Coronavirus 2 by RT PCR NEGATIVE NEGATIVE   Influenza A by PCR NEGATIVE NEGATIVE   Influenza B by PCR NEGATIVE NEGATIVE  Lactic acid, plasma     Status: Abnormal   Collection Time: 09/21/20  1:27 PM  Result Value Ref Range   Lactic Acid, Venous 2.2 (HH) 0.5 - 1.9 mmol/L  C Difficile Quick Screen w PCR reflex     Status: Abnormal   Collection Time: 09/21/20  3:35 PM   Specimen: STOOL  Result Value Ref Range   C Diff antigen POSITIVE (A) NEGATIVE   C Diff toxin POSITIVE (A) NEGATIVE   C Diff interpretation Toxin producing C. difficile detected.   CBC     Status: Abnormal   Collection Time: 09/22/20  5:15 AM  Result Value Ref Range   WBC 6.0 4.0 - 10.5 K/uL   RBC 3.50 (L) 3.87 - 5.11 MIL/uL   Hemoglobin 11.4 (L) 12.0 - 15.0 g/dL   HCT 33.7  (L) 36 - 46 %   MCV 96.3 80.0 - 100.0 fL   MCH 32.6 26.0 - 34.0 pg   MCHC 33.8 30.0 - 36.0 g/dL   RDW 14.7 11.5 - 15.5 %   Platelets 204 150 - 400 K/uL   nRBC 0.0 0.0 - 0.2 %  Comprehensive metabolic panel     Status: Abnormal   Collection Time: 09/22/20  5:15 AM  Result Value Ref Range   Sodium 141 135 - 145 mmol/L   Potassium 2.4 (LL) 3.5 - 5.1 mmol/L   Chloride 109 98 - 111 mmol/L   CO2 20 (L) 22 - 32 mmol/L   Glucose, Bld 106 (H) 70 - 99 mg/dL   BUN 32 (H) 8 - 23 mg/dL   Creatinine, Ser 1.45 (H) 0.44 - 1.00 mg/dL   Calcium 7.1 (L) 8.9 - 10.3 mg/dL   Total Protein 5.1 (L) 6.5 - 8.1 g/dL   Albumin 2.6 (L) 3.5 - 5.0 g/dL   AST 121 (H) 15 - 41 U/L   ALT 71 (H) 0 - 44 U/L   Alkaline Phosphatase 28 (L) 38 - 126 U/L   Total Bilirubin 0.9 0.3 - 1.2 mg/dL   GFR calc non  Af Amer 33 (L) >60 mL/min   GFR calc Af Amer 39 (L) >60 mL/min   Anion gap 12 5 - 15     CT ABDOMEN PELVIS WO CONTRAST  Result Date: 09/21/2020 CLINICAL DATA:  Diarrhea, 3 days ago seen for the same symptoms. Booster vaccine 5 days ago. Lethargic. History of lung cancer, COPD, hypertension. EXAM: CT ABDOMEN AND PELVIS WITHOUT CONTRAST TECHNIQUE: Multidetector CT imaging of the abdomen and pelvis was performed following the standard protocol without IV contrast. COMPARISON:  CT abdomen pelvis 05/28/2019. FINDINGS: Lower chest: Bi basilar linear atelectasis versus scarring. Hepatobiliary: No focal liver abnormality is seen. The gallbladder is noted to be hydropic measuring up to 5.7 cm on axial imaging. Layering hyperdensity within its lumen consistent with calcified gallstones. No CT findings suggest gallbladder wall thickening or pericholecystic fluid. Possible mild central intrahepatic biliary dilatation. Interval development of a prominent common bile duct measuring up to 1 cm. The common bile duct does not demonstrate tapering to normal caliber distally. Pancreas: Unremarkable. No pancreatic ductal dilatation or  surrounding inflammatory changes. Spleen: Normal in size without focal abnormality. Adrenals/Urinary Tract: No adrenal nodule bilaterally. No nephrolithiasis, no hydronephrosis, and no contour-deforming renal mass. No ureterolithiasis or hydroureter. The urinary bladder is unremarkable. Stomach/Bowel: Stomach is within normal limits. The appendix is not definitely identified. No evidence of small bowel wall thickening, dilatation, or inflammatory changes. The colon is distended and fluid-filled. No large bowel wall thickening. No definite pericolonic fat stranding. Redemonstration of medialized mobile cecum. No pneumatosis. Vascular/Lymphatic: No abdominal aorta or iliac aneurysm. Moderate to severe atherosclerotic plaque of the aorta and its branches. No abdominal, pelvic, or inguinal lymphadenopathy. Reproductive: Prostate is unremarkable. Other: No intraperitoneal free fluid. No intraperitoneal free gas. No organized fluid collection. Musculoskeletal: No abdominal wall hernia or abnormality. Fatty infiltration of the gluteal and paraspinal musculature bilaterally. Dextrocurvature of lumbar spine centered at the L2-L3 level. Associated multilevel moderate severe degenerative changes of the spine. No suspicious lytic or blastic osseous lesions. No acute displaced fracture. IMPRESSION: 1. Cholelithiasis with interval development of a hydropic gallbladder and prominent common bile duct that does not taper to normal a caliber distally. Findings concerning for choledocholithiasis. Consider right upper quadrant ultrasound or MRCP for further evaluation. 2. Findings consistent with a rapid bowel transit time. No definite colitis. Electronically Signed   By: Iven Finn M.D.   On: 09/21/2020 17:04   DG Chest Portable 1 View  Result Date: 09/21/2020 CLINICAL DATA:  Cough.  History of lung cancer. EXAM: PORTABLE CHEST 1 VIEW COMPARISON:  11/25/2018 FINDINGS: Chronic scarring in the right lung base medially  unchanged. Blunting right costophrenic angle unchanged. Heart size and vascularity normal. No heart failure identified. Left lung clear. IMPRESSION: Chronic scarring right lung base unchanged. No superimposed acute abnormality. Electronically Signed   By: Franchot Gallo M.D.   On: 09/21/2020 12:01    ROS:  As stated above in the HPI otherwise negative.  Blood pressure 104/64, pulse (!) 109, temperature 99.5 F (37.5 C), temperature source Oral, resp. rate 16, height 5\' 6"  (1.676 m), weight 47.6 kg, SpO2 93 %.    PE: Gen: NAD, Alert and Oriented, fatigued HEENT:  [place]/AT, EOMI Neck: Supple, no LAD Lungs: CTA Bilaterally CV: RRR without M/G/R ABD: Soft, NTND, +BS Ext: No C/C/E  Assessment/Plan: 1) C. Diff diarrhea. 2) Dilated CBD. 3) Abnormal liver enzymes. 4) Cholelithiasis and hydropic gallbladder.   She is responding the IV hydration and she was started on vancomycin.  Her  WBC is normal and she does not have a fever.  Her abdomen is nontender.  There is dilation of the CBD, but it is not clear if she has stones.  The CT scan reads that she has a hydropic gallbladder and cholelithiasis.  It is not clear if she has choledocholithiasis.  Potentially, if she has mucus causing the hydropic gallbladder it can fill up the CBD and cause dilation.  I have noted this type of finding in a previous patient.  Her liver enzymes are improving and she is not symptomatic.  A RUQ U/S is ordered, but she may benefit from an MRCP pending the results of the ultrasound.  She is a bit too weak to pursue an ERCP or EUS today.  Plan: 1) Continue with vancomycin. 2) Continue IV hydration. 3) Await RUQ U/S. 4) Follow liver panel.  Llewellyn Schoenberger D 09/22/2020, 6:44 AM

## 2020-09-23 LAB — CBC WITH DIFFERENTIAL/PLATELET
Abs Immature Granulocytes: 0.22 10*3/uL — ABNORMAL HIGH (ref 0.00–0.07)
Basophils Absolute: 0 10*3/uL (ref 0.0–0.1)
Basophils Relative: 1 %
Eosinophils Absolute: 0.4 10*3/uL (ref 0.0–0.5)
Eosinophils Relative: 5 %
HCT: 32.4 % — ABNORMAL LOW (ref 36.0–46.0)
Hemoglobin: 11.1 g/dL — ABNORMAL LOW (ref 12.0–15.0)
Immature Granulocytes: 3 %
Lymphocytes Relative: 10 %
Lymphs Abs: 0.7 10*3/uL (ref 0.7–4.0)
MCH: 32.4 pg (ref 26.0–34.0)
MCHC: 34.3 g/dL (ref 30.0–36.0)
MCV: 94.5 fL (ref 80.0–100.0)
Monocytes Absolute: 0.6 10*3/uL (ref 0.1–1.0)
Monocytes Relative: 8 %
Neutro Abs: 5 10*3/uL (ref 1.7–7.7)
Neutrophils Relative %: 73 %
Platelets: 186 10*3/uL (ref 150–400)
RBC: 3.43 MIL/uL — ABNORMAL LOW (ref 3.87–5.11)
RDW: 15.2 % (ref 11.5–15.5)
WBC: 6.8 10*3/uL (ref 4.0–10.5)
nRBC: 0 % (ref 0.0–0.2)

## 2020-09-23 LAB — COMPREHENSIVE METABOLIC PANEL
ALT: 65 U/L — ABNORMAL HIGH (ref 0–44)
AST: 75 U/L — ABNORMAL HIGH (ref 15–41)
Albumin: 2.3 g/dL — ABNORMAL LOW (ref 3.5–5.0)
Alkaline Phosphatase: 32 U/L — ABNORMAL LOW (ref 38–126)
Anion gap: 11 (ref 5–15)
BUN: 24 mg/dL — ABNORMAL HIGH (ref 8–23)
CO2: 18 mmol/L — ABNORMAL LOW (ref 22–32)
Calcium: 6.8 mg/dL — ABNORMAL LOW (ref 8.9–10.3)
Chloride: 112 mmol/L — ABNORMAL HIGH (ref 98–111)
Creatinine, Ser: 0.95 mg/dL (ref 0.44–1.00)
GFR calc Af Amer: >60 mL/min (ref >60)
GFR calc non Af Amer: 55 mL/min — ABNORMAL LOW (ref >60)
Glucose, Bld: 84 mg/dL (ref 70–99)
Potassium: 3.6 mmol/L (ref 3.5–5.1)
Sodium: 141 mmol/L (ref 135–145)
Total Bilirubin: 0.7 mg/dL (ref 0.3–1.2)
Total Protein: 4.9 g/dL — ABNORMAL LOW (ref 6.5–8.1)

## 2020-09-23 LAB — MAGNESIUM: Magnesium: 2.8 mg/dL — ABNORMAL HIGH (ref 1.7–2.4)

## 2020-09-23 MED ORDER — FIDAXOMICIN 200 MG PO TABS
200.0000 mg | ORAL_TABLET | Freq: Two times a day (BID) | ORAL | Status: DC
  Administered 2020-09-23 – 2020-09-28 (×11): 200 mg via ORAL
  Filled 2020-09-23 (×12): qty 1

## 2020-09-23 MED ORDER — AMLODIPINE BESYLATE 5 MG PO TABS
2.5000 mg | ORAL_TABLET | Freq: Every day | ORAL | Status: DC
  Administered 2020-09-23: 2.5 mg via ORAL
  Filled 2020-09-23: qty 1

## 2020-09-23 NOTE — Progress Notes (Signed)
PROGRESS NOTE  Dana [name] HFW:[id number] DOB: 05/07/1935 DOA: 09/21/2020 PCP: [name], MD  HPI/Recap of past 24 hours:  HPI: Dana [name] is a 85 y.o. female with medical history significant for COPD, NSCLC in remission since 2010 on Tarceva.  Presented to [facility] ED due to constant diarrhea for the past 4 days.  Got COVID booster 09/18/20.  Shortly after that had onset of diarrhea. H/o c.diff in 2012.  Has had intermittent diarrhea for the past 10 years on tarceva but usually this gets better with imodium.  Diarrhea this time persisted despite immodium.  No recent ABx, no recent travel, no blood in stool.  ED Course: Mild LFT elevations, Creat 2.3 up from 1.0 on 09/18/20.  WBC nl.  BP on low side, given 2L LR bolus.  Lactates 2.4 and 2.2.  AST 201, ALT 88 up from 25 and 21 on 9/26.  CT abd pelvis without contrast done on 09/21/2020 showed: IMPRESSION: 1. Cholelithiasis with interval development of a hydropic gallbladder and prominent common bile duct that does not taper to normal a caliber distally. Findings concerning for choledocholithiasis. Consider right upper quadrant ultrasound or MRCP for further evaluation. 2. Findings consistent with a rapid bowel transit time. No definite colitis.  Stool C.diff PCR is positive.  09/23/20: She reports more episodes of diarrhea this morning. X 3 by the time of this dictation.  No significant abdominal pain or nausea.  Assessment/Plan: Principal Problem:   AKI (acute kidney injury) ([place]) Active Problems:   Cancer of right lung parenchyma (HCC)   COPD GOLD 0/ lama/laba responsive   Choledocholithiasis   Diarrhea   Hypotension   C. difficile diarrhea  Intractable diarrhea 2/2 to C-diff infection, POA Presented with diarrhea of 4 days duration not improved with home Imodium Hx of C-diff in 2012 C-diff PCR positive on 09/21/20 Started on po vancomycin with taper, day #2  Non-small cell lung cancer in remission since  2010 Continue to hold off Tarceva as recommended by oncology in the next 7 days Will need to follow-up with oncology posthospitalization.  Distended gallbladder with sludge and gallstones Right upper quadrant ultrasound done on 09/22/2020 revealed: 1. Distended gallbladder with sludge and small gallstones. No sonographic findings of acute cholecystitis. 2. Intrahepatic and extrahepatic biliary dilatation. No visualized Choledocholithiasis. GI consulted, management per GI.  Resolved post repletion: Hypokalemia, secondary to GI losses from diarrhea Presented with serum potassium of 2.4 Serum potassium this morning 3.6  Resolved post repletion: Hypomagnesemia secondary to GI losses from diarrhea Presented with serum magnesium of 1.2 Magnesium 2.8 this morning.  Elevated LFTs Downtrending Continue to trend  Physical debility PT to assess Fall precautions    Code Status: Full code  Family Communication: None at bedside  Disposition Plan: To home with home health services   Consultants:  GI  Procedures:  None  Antimicrobials:  P.o. vancomycin started on 09/22/2020.  Started on 09/21/2020 and discontinued on 09/22/2020.  DVT prophylaxis: Subcu Lovenox daily  Status is: Inpatient   Dispo:  Patient From: Home  Planned Disposition: Home  Expected discharge date: 09/25/20  Medically stable for discharge: No, ongoing management of C. difficile diarrhea, on IV fluids to avoid dehydration.         Objective: Vitals:   09/22/20 1531 09/22/20 2036 09/23/20 0404 09/23/20 0729  BP: 125/74 (!) 153/79 (!) 160/82 137/74  Pulse: 98 94 92 84  Resp: 16 20 18    Temp: (!) 97.4 F (36.3 C) (!) 97.3 F (36.3  C) (!) 97.3 F (36.3 C)   TempSrc: Oral Oral Oral   SpO2: 94% 95% 100% 97%  Weight:      Height:        Intake/Output Summary (Last 24 hours) at 09/23/2020 1017 Last data filed at 09/23/2020 0700 Gross per 24 hour  Intake 783.09 ml  Output 0 ml  Net 783.09  ml   Filed Weights   09/21/20 2351  Weight: 47.6 kg    Exam:  . General: 85 y.o. year-old female well developed well nourished in no acute distress.  Alert and oriented x3. . Cardiovascular: Regular rate and rhythm with no rubs or gallops.  No thyromegaly or JVD noted.   Marland Kitchen Respiratory: Clear to auscultation with no wheezes or rales. Good inspiratory effort. . Abdomen: Soft nontender nondistended with normal bowel sounds x4 quadrants. . Musculoskeletal: No lower extremity edema. 2/4 pulses in all 4 extremities. Marland Kitchen Psychiatry: Mood is appropriate for condition and setting   Data Reviewed: CBC: Recent Labs  Lab 09/18/20 1408 09/21/20 1132 09/22/20 0515 09/23/20 0609  WBC 7.8 7.4 6.0 6.8  NEUTROABS 6.3 5.8  --  5.0  HGB 12.8 14.0 11.4* 11.1*  HCT 38.2 42.2 33.7* 32.4*  MCV 95.5 94.6 96.3 94.5  PLT 233 286 204 321   Basic Metabolic Panel: Recent Labs  Lab 09/18/20 1408 09/21/20 1132 09/22/20 0515 09/23/20 0609  NA 137 138 141 141  K 4.1 3.1* 2.4* 3.6  CL 99 99 109 112*  CO2 28 23 20* 18*  GLUCOSE 105* 129* 106* 84  BUN 20 40* 32* 24*  CREATININE 1.07* 2.37* 1.45* 0.95  CALCIUM 9.0 8.4* 7.1* 6.8*  MG  --   --  1.2* 2.8*   GFR: Estimated Creatinine Clearance: 33.7 mL/min (by C-G formula based on SCr of 0.95 mg/dL). Liver Function Tests: Recent Labs  Lab 09/18/20 1408 09/21/20 1132 09/22/20 0515 09/23/20 0609  AST 25 201* 121* 75*  ALT 21 88* 71* 65*  ALKPHOS 33* 34* 28* 32*  BILITOT 1.0 0.9 0.9 0.7  PROT 6.2* 6.9 5.1* 4.9*  ALBUMIN 3.5 3.4* 2.6* 2.3*   No results for input(s): LIPASE, AMYLASE in the last 168 hours. No results for input(s): AMMONIA in the last 168 hours. Coagulation Profile: No results for input(s): INR, PROTIME in the last 168 hours. Cardiac Enzymes: No results for input(s): CKTOTAL, CKMB, CKMBINDEX, TROPONINI in the last 168 hours. BNP (last 3 results) No results for input(s): PROBNP in the last 8760 hours. HbA1C: No results for  input(s): HGBA1C in the last 72 hours. CBG: No results for input(s): GLUCAP in the last 168 hours. Lipid Profile: No results for input(s): CHOL, HDL, LDLCALC, TRIG, CHOLHDL, LDLDIRECT in the last 72 hours. Thyroid Function Tests: No results for input(s): TSH, T4TOTAL, FREET4, T3FREE, THYROIDAB in the last 72 hours. Anemia Panel: No results for input(s): VITAMINB12, FOLATE, FERRITIN, TIBC, IRON, RETICCTPCT in the last 72 hours. Urine analysis:    Component Value Date/Time   COLORURINE YELLOW 05/28/2019 [place] 05/28/2019 2225   LABSPEC 1.010 05/28/2019 2225   PHURINE 6.5 05/28/2019 2225   GLUCOSEU NEGATIVE 05/28/2019 2225   HGBUR NEGATIVE 05/28/2019 2225   BILIRUBINUR NEGATIVE 05/28/2019 2225   KETONESUR NEGATIVE 05/28/2019 2225   PROTEINUR NEGATIVE 05/28/2019 2225   UROBILINOGEN 1.0 01/06/2013 1029   NITRITE NEGATIVE 05/28/2019 2225   LEUKOCYTESUR SMALL (A) 05/28/2019 2225   Sepsis Labs: @LABRCNTIP (procalcitonin:4,lacticidven:4)  ) Recent Results (from the past 240 hour(s))  Respiratory Panel  by RT PCR (Flu A&B, Covid) - Nasopharyngeal Swab     Status: None   Collection Time: 09/21/20 11:32 AM   Specimen: Nasopharyngeal Swab  Result Value Ref Range Status   SARS Coronavirus 2 by RT PCR NEGATIVE NEGATIVE Final    Comment: (NOTE) SARS-CoV-2 target nucleic acids are NOT DETECTED.  The SARS-CoV-2 RNA is generally detectable in upper respiratoy specimens during the acute phase of infection. The lowest concentration of SARS-CoV-2 viral copies this assay can detect is 131 copies/mL. A negative result does not preclude SARS-Cov-2 infection and should not be used as the sole basis for treatment or other patient management decisions. A negative result may occur with  improper specimen collection/handling, submission of specimen other than nasopharyngeal swab, presence of viral mutation(s) within the areas targeted by this assay, and inadequate number of viral  copies (<131 copies/mL). A negative result must be combined with clinical observations, patient history, and epidemiological information. The expected result is Negative.  Fact Sheet for Patients:  PinkCheek.be  Fact Sheet for Healthcare Providers:  GravelBags.it  This test is no t yet approved or cleared by the [place] FDA and  has been authorized for detection and/or diagnosis of SARS-CoV-2 by FDA under an Emergency Use Authorization (EUA). This EUA will remain  in effect (meaning this test can be used) for the duration of the COVID-19 declaration under Section 564(b)(1) of the Act, 21 U.S.C. section 360bbb-3(b)(1), unless the authorization is terminated or revoked sooner.     Influenza A by PCR NEGATIVE NEGATIVE Final   Influenza B by PCR NEGATIVE NEGATIVE Final    Comment: (NOTE) The Xpert Xpress SARS-CoV-2/FLU/RSV assay is intended as an aid in  the diagnosis of influenza from Nasopharyngeal swab specimens and  should not be used as a sole basis for treatment. Nasal washings and  aspirates are unacceptable for Xpert Xpress SARS-CoV-2/FLU/RSV  testing.  Fact Sheet for Patients: PinkCheek.be  Fact Sheet for Healthcare Providers: GravelBags.it  This test is not yet approved or cleared by the [place] FDA and  has been authorized for detection and/or diagnosis of SARS-CoV-2 by  FDA under an Emergency Use Authorization (EUA). This EUA will remain  in effect (meaning this test can be used) for the duration of the  Covid-19 declaration under Section 564(b)(1) of the Act, 21  U.S.C. section 360bbb-3(b)(1), unless the authorization is  terminated or revoked. Performed at [facility], [address] [place], [place] [address]   C Difficile Quick Screen w PCR reflex     Status: Abnormal   Collection Time: 09/21/20  3:35 PM   Specimen: STOOL   Result Value Ref Range Status   C Diff antigen POSITIVE (A) NEGATIVE Final   C Diff toxin POSITIVE (A) NEGATIVE Final   C Diff interpretation Toxin producing C. difficile detected.  Final    Comment: CRITICAL RESULT CALLED TO, READ BACK BY AND VERIFIED WITH: Rosemarie Ax RN [id number] 09/21/20 A BROWNING Performed at [facility], [place] [address], [place], [place] [address]   Gastrointestinal Panel by PCR , Stool     Status: None   Collection Time: 09/21/20  3:35 PM   Specimen: Stool  Result Value Ref Range Status   Campylobacter species NOT DETECTED NOT DETECTED Final   Plesimonas shigelloides NOT DETECTED NOT DETECTED Final   Salmonella species NOT DETECTED NOT DETECTED Final   Yersinia enterocolitica NOT DETECTED NOT DETECTED Final   Vibrio species NOT DETECTED NOT DETECTED Final  Vibrio cholerae NOT DETECTED NOT DETECTED Final   Enteroaggregative E coli (EAEC) NOT DETECTED NOT DETECTED Final   Enteropathogenic E coli (EPEC) NOT DETECTED NOT DETECTED Final   Enterotoxigenic E coli (ETEC) NOT DETECTED NOT DETECTED Final   Shiga like toxin producing E coli (STEC) NOT DETECTED NOT DETECTED Final   Shigella/Enteroinvasive E coli (EIEC) NOT DETECTED NOT DETECTED Final   Cryptosporidium NOT DETECTED NOT DETECTED Final   Cyclospora cayetanensis NOT DETECTED NOT DETECTED Final   Entamoeba histolytica NOT DETECTED NOT DETECTED Final   Giardia lamblia NOT DETECTED NOT DETECTED Final   Adenovirus F40/41 NOT DETECTED NOT DETECTED Final   Astrovirus NOT DETECTED NOT DETECTED Final   Norovirus GI/GII NOT DETECTED NOT DETECTED Final   Rotavirus A NOT DETECTED NOT DETECTED Final   Sapovirus (I, II, IV, and V) NOT DETECTED NOT DETECTED Final    Comment: Performed at [facility], [address], [place], [place] [address]      Studies: US Abdomen Limited RUQ  Result Date: 09/22/2020 CLINICAL DATA:  Right upper quadrant pain.  Choledocholithiasis. EXAM: ULTRASOUND ABDOMEN LIMITED RIGHT  UPPER QUADRANT COMPARISON:  Noncontrast abdominopelvic CT yesterday. FINDINGS: Gallbladder: Distended with layering sludge. Small gallstones. No wall thickening. No pericholecystic fluid. No sonographic Murphy sign noted by sonographer. Common bile duct: Diameter: Dilated measuring 13 mm proximally. Slightly diminished visualization distally near the pancreatic head. There is no visualized choledocholithiasis. Liver: Central intrahepatic biliary ductal dilatation. No focal lesion identified. Within normal limits in parenchymal echogenicity. Portal vein is patent on color Doppler imaging with normal direction of blood flow towards the liver. Other: No right upper quadrant ascites. IMPRESSION: 1. Distended gallbladder with sludge and small gallstones. No sonographic findings of acute cholecystitis. 2. Intrahepatic and extrahepatic biliary dilatation. No visualized choledocholithiasis. Electronically Signed   By: Keith Rake M.D.   On: 09/22/2020 17:10    Scheduled Meds: . amLODipine  2.5 mg Oral Daily  . arformoterol  15 mcg Nebulization BID  . enoxaparin (LOVENOX) injection  30 mg Subcutaneous Q24H  . meloxicam  15 mg Oral Daily  . mirtazapine  15 mg Oral QHS  . mupirocin cream   Topical Daily  . potassium chloride  40 mEq Oral BID  . simvastatin  20 mg Oral Q M,W,F  . umeclidinium bromide  1 puff Inhalation Daily  . vancomycin  125 mg Oral QID   Followed by  . [START ON 10/05/2020] vancomycin  125 mg Oral BID   Followed by  . [START ON 10/13/2020] vancomycin  125 mg Oral Daily   Followed by  . [START ON 10/20/2020] vancomycin  125 mg Oral QODAY   Followed by  . [START ON 10/28/2020] vancomycin  125 mg Oral Q3 days    Continuous Infusions: . sodium chloride 100 mL/hr at 09/23/20 0120     LOS: 2 days     Kayleen Memos, MD [facility] Pager [phone]  If 7PM-7AM, please contact night-coverage [email or web address] Password [email or web address] 09/23/2020, 10:17 AM

## 2020-09-23 NOTE — Progress Notes (Addendum)
Subjective: She complains about being cold and having diarrhea with drinking some fluids.  Objective: Vital signs in last 24 hours: Temp:  [97.3 F (36.3 C)-97.4 F (36.3 C)] 97.3 F (36.3 C) (10/01 0404) Pulse Rate:  [84-98] 84 (10/01 0729) Resp:  [16-20] 18 (10/01 0404) BP: (125-160)/(74-82) 137/74 (10/01 0729) SpO2:  [94 %-100 %] 97 % (10/01 0729) Last BM Date: 09/23/20  Intake/Output from previous day: 09/30 0701 - 10/01 0700 In: 783.1 [P.O.:240; I.V.:543.1] Out: 100 [Urine:100] Intake/Output this shift: No intake/output data recorded.  General appearance: alert and no distress Resp: clear to auscultation bilaterally Cardio: regular rate and rhythm GI: soft, non-tender; bowel sounds normal; no masses,  no organomegaly Extremities: extremities normal, atraumatic, no cyanosis or edema  Lab Results: Recent Labs    09/21/20 1132 09/22/20 0515 09/23/20 0609  WBC 7.4 6.0 6.8  HGB 14.0 11.4* 11.1*  HCT 42.2 33.7* 32.4*  PLT 286 204 186   BMET Recent Labs    09/21/20 1132 09/22/20 0515 09/23/20 0609  NA 138 141 141  K 3.1* 2.4* 3.6  CL 99 109 112*  CO2 23 20* 18*  GLUCOSE 129* 106* 84  BUN 40* 32* 24*  CREATININE 2.37* 1.45* 0.95  CALCIUM 8.4* 7.1* 6.8*   LFT Recent Labs    09/23/20 0609  PROT 4.9*  ALBUMIN 2.3*  AST 75*  ALT 65*  ALKPHOS 32*  BILITOT 0.7   PT/INR No results for input(s): LABPROT, INR in the last 72 hours. Hepatitis Panel No results for input(s): HEPBSAG, HCVAB, HEPAIGM, HEPBIGM in the last 72 hours. C-Diff Recent Labs    09/21/20 1535  CDIFFTOX POSITIVE*   Fecal Lactopherrin No results for input(s): FECLLACTOFRN in the last 72 hours.  Studies/Results: CT ABDOMEN PELVIS WO CONTRAST  Result Date: 09/21/2020 CLINICAL DATA:  Diarrhea, 3 days ago seen for the same symptoms. Booster vaccine 5 days ago. Lethargic. History of lung cancer, COPD, hypertension. EXAM: CT ABDOMEN AND PELVIS WITHOUT CONTRAST TECHNIQUE: Multidetector CT  imaging of the abdomen and pelvis was performed following the standard protocol without IV contrast. COMPARISON:  CT abdomen pelvis 05/28/2019. FINDINGS: Lower chest: Bi basilar linear atelectasis versus scarring. Hepatobiliary: No focal liver abnormality is seen. The gallbladder is noted to be hydropic measuring up to 5.7 cm on axial imaging. Layering hyperdensity within its lumen consistent with calcified gallstones. No CT findings suggest gallbladder wall thickening or pericholecystic fluid. Possible mild central intrahepatic biliary dilatation. Interval development of a prominent common bile duct measuring up to 1 cm. The common bile duct does not demonstrate tapering to normal caliber distally. Pancreas: Unremarkable. No pancreatic ductal dilatation or surrounding inflammatory changes. Spleen: Normal in size without focal abnormality. Adrenals/Urinary Tract: No adrenal nodule bilaterally. No nephrolithiasis, no hydronephrosis, and no contour-deforming renal mass. No ureterolithiasis or hydroureter. The urinary bladder is unremarkable. Stomach/Bowel: Stomach is within normal limits. The appendix is not definitely identified. No evidence of small bowel wall thickening, dilatation, or inflammatory changes. The colon is distended and fluid-filled. No large bowel wall thickening. No definite pericolonic fat stranding. Redemonstration of medialized mobile cecum. No pneumatosis. Vascular/Lymphatic: No abdominal aorta or iliac aneurysm. Moderate to severe atherosclerotic plaque of the aorta and its branches. No abdominal, pelvic, or inguinal lymphadenopathy. Reproductive: Prostate is unremarkable. Other: No intraperitoneal free fluid. No intraperitoneal free gas. No organized fluid collection. Musculoskeletal: No abdominal wall hernia or abnormality. Fatty infiltration of the gluteal and paraspinal musculature bilaterally. Dextrocurvature of lumbar spine centered at the L2-L3 level. Associated multilevel  moderate  severe degenerative changes of the spine. No suspicious lytic or blastic osseous lesions. No acute displaced fracture. IMPRESSION: 1. Cholelithiasis with interval development of a hydropic gallbladder and prominent common bile duct that does not taper to normal a caliber distally. Findings concerning for choledocholithiasis. Consider right upper quadrant ultrasound or MRCP for further evaluation. 2. Findings consistent with a rapid bowel transit time. No definite colitis. Electronically Signed   By: Iven Finn M.D.   On: 09/21/2020 17:04   DG Chest Portable 1 View  Result Date: 09/21/2020 CLINICAL DATA:  Cough.  History of lung cancer. EXAM: PORTABLE CHEST 1 VIEW COMPARISON:  11/25/2018 FINDINGS: Chronic scarring in the right lung base medially unchanged. Blunting right costophrenic angle unchanged. Heart size and vascularity normal. No heart failure identified. Left lung clear. IMPRESSION: Chronic scarring right lung base unchanged. No superimposed acute abnormality. Electronically Signed   By: Franchot Gallo M.D.   On: 09/21/2020 12:01   US Abdomen Limited RUQ  Result Date: 09/22/2020 CLINICAL DATA:  Right upper quadrant pain.  Choledocholithiasis. EXAM: ULTRASOUND ABDOMEN LIMITED RIGHT UPPER QUADRANT COMPARISON:  Noncontrast abdominopelvic CT yesterday. FINDINGS: Gallbladder: Distended with layering sludge. Small gallstones. No wall thickening. No pericholecystic fluid. No sonographic Murphy sign noted by sonographer. Common bile duct: Diameter: Dilated measuring 13 mm proximally. Slightly diminished visualization distally near the pancreatic head. There is no visualized choledocholithiasis. Liver: Central intrahepatic biliary ductal dilatation. No focal lesion identified. Within normal limits in parenchymal echogenicity. Portal vein is patent on color Doppler imaging with normal direction of blood flow towards the liver. Other: No right upper quadrant ascites. IMPRESSION: 1. Distended gallbladder  with sludge and small gallstones. No sonographic findings of acute cholecystitis. 2. Intrahepatic and extrahepatic biliary dilatation. No visualized choledocholithiasis. Electronically Signed   By: Keith Rake M.D.   On: 09/22/2020 17:10    Medications:  Scheduled: . amLODipine  2.5 mg Oral Daily  . arformoterol  15 mcg Nebulization BID  . enoxaparin (LOVENOX) injection  30 mg Subcutaneous Q24H  . meloxicam  15 mg Oral Daily  . mirtazapine  15 mg Oral QHS  . mupirocin cream   Topical Daily  . potassium chloride  40 mEq Oral BID  . simvastatin  20 mg Oral Q M,W,F  . umeclidinium bromide  1 puff Inhalation Daily  . vancomycin  125 mg Oral QID   Followed by  . [START ON 10/05/2020] vancomycin  125 mg Oral BID   Followed by  . [START ON 10/13/2020] vancomycin  125 mg Oral Daily   Followed by  . [START ON 10/20/2020] vancomycin  125 mg Oral QODAY   Followed by  . [START ON 10/28/2020] vancomycin  125 mg Oral Q3 days   Continuous: . sodium chloride 100 mL/hr at 09/23/20 0120    Assessment/Plan: 1) C diff colitis. 2) Dilated biliary ducts. 3) Improving liver enzymes.   Her liver enzymes are down trending and the RUQ U/S did not detect any choledocholithiasis, but it cannot evaluate the distal CBD.  Clinically she is stable, but she continues to have diarrhea.  This should start to improve with vancomycin.  Plan: 1) Continue with vancomycin. 2) Follow liver enzymes. 3) If the patient remains asymptomatic from the biliary standpoint and her liver enzymes continue to normalize, this issue will be evaluated as an outpatient. 4) Dr. Ardis Hughs, [facility], will round on the patient this weekend.  LOS: 2 days   Tessi Eustache D 09/23/2020, 9:44 AM

## 2020-09-23 NOTE — TOC Benefit Eligibility Note (Cosign Needed Addendum)
Patient Advocate Progress Note  Completed and sent [employer] application for Dificid for this patient who is insured but needs copay assistance.    Patient assistance phone number for follow up is [phone]  Patient will be mailed a 10 day supply.   [facility] planning to Ship medication out on Monday to receive on Tuesday.    Talked to Price on 09/26/20:  Dificid was delivered to her home on 09/26/20 at 10:00 am  Lyndel Safe, [place] Certified Pharmacy Technician- Transitions of [place] Team Direct Number: [phone]  Fax: [phone]

## 2020-09-24 DIAGNOSIS — N179 Acute kidney failure, unspecified: Secondary | ICD-10-CM

## 2020-09-24 LAB — CBC WITH DIFFERENTIAL/PLATELET
Abs Immature Granulocytes: 0.4 10*3/uL — ABNORMAL HIGH (ref 0.00–0.07)
Basophils Absolute: 0.1 10*3/uL (ref 0.0–0.1)
Basophils Relative: 1 %
Eosinophils Absolute: 0.3 10*3/uL (ref 0.0–0.5)
Eosinophils Relative: 5 %
HCT: 33.2 % — ABNORMAL LOW (ref 36.0–46.0)
Hemoglobin: 11 g/dL — ABNORMAL LOW (ref 12.0–15.0)
Immature Granulocytes: 5 %
Lymphocytes Relative: 8 %
Lymphs Abs: 0.6 10*3/uL — ABNORMAL LOW (ref 0.7–4.0)
MCH: 32.2 pg (ref 26.0–34.0)
MCHC: 33.1 g/dL (ref 30.0–36.0)
MCV: 97.1 fL (ref 80.0–100.0)
Monocytes Absolute: 0.6 10*3/uL (ref 0.1–1.0)
Monocytes Relative: 8 %
Neutro Abs: 5.4 10*3/uL (ref 1.7–7.7)
Neutrophils Relative %: 73 %
Platelets: 239 10*3/uL (ref 150–400)
RBC: 3.42 MIL/uL — ABNORMAL LOW (ref 3.87–5.11)
RDW: 15.7 % — ABNORMAL HIGH (ref 11.5–15.5)
WBC: 7.4 10*3/uL (ref 4.0–10.5)
nRBC: 0 % (ref 0.0–0.2)

## 2020-09-24 LAB — COMPREHENSIVE METABOLIC PANEL
ALT: 61 U/L — ABNORMAL HIGH (ref 0–44)
AST: 53 U/L — ABNORMAL HIGH (ref 15–41)
Albumin: 2.4 g/dL — ABNORMAL LOW (ref 3.5–5.0)
Alkaline Phosphatase: 35 U/L — ABNORMAL LOW (ref 38–126)
Anion gap: 6 (ref 5–15)
BUN: 17 mg/dL (ref 8–23)
CO2: 18 mmol/L — ABNORMAL LOW (ref 22–32)
Calcium: 6.7 mg/dL — ABNORMAL LOW (ref 8.9–10.3)
Chloride: 117 mmol/L — ABNORMAL HIGH (ref 98–111)
Creatinine, Ser: 0.77 mg/dL (ref 0.44–1.00)
GFR calc Af Amer: >60 mL/min (ref >60)
GFR calc non Af Amer: >60 mL/min (ref >60)
Glucose, Bld: 89 mg/dL (ref 70–99)
Potassium: 4 mmol/L (ref 3.5–5.1)
Sodium: 141 mmol/L (ref 135–145)
Total Bilirubin: 0.7 mg/dL (ref 0.3–1.2)
Total Protein: 4.9 g/dL — ABNORMAL LOW (ref 6.5–8.1)

## 2020-09-24 MED ORDER — ENSURE ENLIVE PO LIQD
237.0000 mL | Freq: Two times a day (BID) | ORAL | Status: DC
  Administered 2020-09-25: 237 mL via ORAL

## 2020-09-24 MED ORDER — POTASSIUM CHLORIDE CRYS ER 20 MEQ PO TBCR
40.0000 meq | EXTENDED_RELEASE_TABLET | Freq: Two times a day (BID) | ORAL | Status: AC
  Administered 2020-09-24 – 2020-09-25 (×3): 40 meq via ORAL
  Filled 2020-09-24 (×4): qty 2

## 2020-09-24 MED ORDER — AMLODIPINE BESYLATE 5 MG PO TABS
5.0000 mg | ORAL_TABLET | Freq: Every day | ORAL | Status: DC
  Administered 2020-09-24 – 2020-09-28 (×5): 5 mg via ORAL
  Filled 2020-09-24 (×5): qty 1

## 2020-09-24 NOTE — Progress Notes (Signed)
[facility] Progress Note Covering for Drs. Collene Mares and Benson Norway this weekend    Since last GI note: Loose stools continue, less frequent (7 times in past 24 hours including 3 overnight), +urgency, no bleeding. She is eating better  Objective: Vital signs in last 24 hours: Temp:  [97.7 F (36.5 C)-98.1 F (36.7 C)] 98.1 F (36.7 C) (10/02 0527) Pulse Rate:  [84-97] 96 (10/02 0527) Resp:  [18-20] 20 (10/02 0527) BP: (137-163)/(74-90) 163/90 (10/02 0527) SpO2:  [93 %-98 %] 97 % (10/02 0630) Last BM Date: 09/23/20 General: alert and oriented times 3 Heart: regular rate and rythm Abdomen: soft, non-tender, non-distended, normal bowel sounds  Lab Results: Recent Labs    09/21/20 1132 09/22/20 0515 09/23/20 0609  WBC 7.4 6.0 6.8  HGB 14.0 11.4* 11.1*  PLT 286 204 186  MCV 94.6 96.3 94.5   Recent Labs    09/21/20 1132 09/22/20 0515 09/23/20 0609  NA 138 141 141  K 3.1* 2.4* 3.6  CL 99 109 112*  CO2 23 20* 18*  GLUCOSE 129* 106* 84  BUN 40* 32* 24*  CREATININE 2.37* 1.45* 0.95  CALCIUM 8.4* 7.1* 6.8*   Recent Labs    09/21/20 1132 09/22/20 0515 09/23/20 0609  PROT 6.9 5.1* 4.9*  ALBUMIN 3.4* 2.6* 2.3*  AST 201* 121* 75*  ALT 88* 71* 65*  ALKPHOS 34* 28* 32*  BILITOT 0.9 0.9 0.7   Medications: Scheduled Meds: . amLODipine  5 mg Oral Daily  . arformoterol  15 mcg Nebulization BID  . enoxaparin (LOVENOX) injection  30 mg Subcutaneous Q24H  . fidaxomicin  200 mg Oral BID  . meloxicam  15 mg Oral Daily  . mirtazapine  15 mg Oral QHS  . mupirocin cream   Topical Daily  . potassium chloride  40 mEq Oral BID  . simvastatin  20 mg Oral Q M,W,F  . umeclidinium bromide  1 puff Inhalation Daily   Continuous Infusions: . sodium chloride 100 mL/hr at 09/23/20 2308   PRN Meds:.acetaminophen **OR** acetaminophen, albuterol, lip balm, loratadine, ondansetron **OR** ondansetron (ZOFRAN) IV  Assessment/Plan: 85 y.o. female with C diff + diarrhea  Seems to  be improving but I don't think she's safe for d/c just yet. Maybe tomorrow? She is eating better and so I'm going to turn down her IVFluids a bit (from 100/hour to 50/hour)  C diff + on 9/29. Notes yesterday state that she was on vancomycin however it looks like now she is on dificid 200mg  BID.   LFTs not back yet today, but had been improving the past few days. Out patient workup recommended.  Will continue to follow.  Milus Banister, MD  09/24/2020, 7:18 AM [facility] Pager [phone]

## 2020-09-24 NOTE — Plan of Care (Signed)
  Problem: Education: Goal: Knowledge of General Education information will improve Description: Including pain rating scale, medication(s)/side effects and non-pharmacologic comfort measures Outcome: Progressing   Problem: Activity: Goal: Risk for activity intolerance will decrease Outcome: Progressing   

## 2020-09-24 NOTE — Progress Notes (Addendum)
PROGRESS NOTE  Dana [name] SWN:[id number] DOB: [birth year] DOA: 09/21/2020 PCP: [name], MD  HPI/Recap of past 24 hours: Dana [name] is a 85 y.o. female with medical history significant for COPD, NSCLC in remission since 2010 on Tarceva.  Presented to [facility] ED due to intractable diarrhea for the past 4 days, not responding to Imodium.  Got COVID booster 09/18/20.  Shortly after that had onset of diarrhea.  H/o c.diff in 2012.  Has had intermittent diarrhea for the past 10 years on tarceva but usually this gets better with imodium.  No recent ABx, no recent travel, no blood in stool.  ED Course: Mild LFT elevations, Creat 2.3 up from 1.0 on 09/18/20.  WBC nl.  BP soft, received 2L LR bolus.  Lactic acid 2.4 and 2.2.  AST 201, ALT 88 up from 25 and 21 on 09/18/20.  CT abd pelvis without contrast done on 09/21/2020 showed: 1. Cholelithiasis with interval development of a hydropic gallbladder and prominent common bile duct that does not taper to normal a caliber distally. Findings concerning for choledocholithiasis. Consider right upper quadrant ultrasound or MRCP for further evaluation. 2. Findings consistent with a rapid bowel transit time. No definite colitis.  Stool C.diff PCR is positive on 09/21/2020, GI panel by PCR negative.  09/24/20: She reports persistent diarrhea this morning.  Mild abdominal cramping.  No nausea or vomiting.  Tolerating a diet although her appetite is poor.  Assessment/Plan: Principal Problem:   AKI (acute kidney injury) ([place]) Active Problems:   Cancer of right lung parenchyma (HCC)   COPD GOLD 0/ lama/laba responsive   Choledocholithiasis   Diarrhea   Hypotension   C. difficile diarrhea  Persistent intractable diarrhea 2/2 to C-diff infection, POA Presented with diarrhea of 4 days duration not improved with home Imodium Hx of C-diff in 2012 C-diff PCR positive on 09/21/20 Started on po vancomycin with taper, completed 2 days Switched to  Dificid on 09/23/2020, 200 mg twice daily x10 days. Continue IV fluids to avoid dehydration Encourage oral intake  Non-small cell lung cancer in remission since 2010 Continue to hold off Tarceva as recommended by oncology in the next 7 days from 09/23/2020 Will need to follow-up with oncology posthospitalization.  Distended gallbladder with sludge and gallstones Right upper quadrant ultrasound done on 09/22/2020 revealed: 1. Distended gallbladder with sludge and small gallstones. No sonographic findings of acute cholecystitis. 2. Intrahepatic and extrahepatic biliary dilatation. No visualized Choledocholithiasis. GI consulted, management per GI. LFTs are downtrending  Elevated liver chemistries, improving LFTs are downtrending. Avoid hepatotoxic agents and continue to monitor  Resolved post repletion: Hypokalemia, secondary to GI losses from diarrhea Presented with serum potassium of 2.4 Serum potassium this morning 3.6  Resolved post repletion: Hypomagnesemia secondary to GI losses from diarrhea Presented with serum magnesium of 1.2 Magnesium 2.8 this morning.  Physical debility PT assessment recommended SNF Fall precautions TOC consulted to assist with SNF placement.  Moderate protein calorie malnutrition BMI 16 Albumin 2.4 Start oral supplement Encourage increase in protein calorie intake.    Code Status: Full code  Family Communication: None at bedside  Disposition Plan: From home, PT recommending SNF.   Consultants:  GI  Procedures:  None  Antimicrobials:  P.o. vancomycin started on 09/22/2020.  Started on 09/21/2020 and discontinued on 09/22/2020.  DVT prophylaxis: Subcu Lovenox daily  Status is: Inpatient   Dispo:  Patient From: Home  Planned Disposition: Home  Expected discharge date: 09/25/20  Medically stable for discharge: No, ongoing  management of C. difficile diarrhea, on IV fluids to avoid  dehydration.         Objective: Vitals:   09/23/20 2148 09/24/20 0527 09/24/20 0630 09/24/20 1337  BP:  (!) 163/90  137/80  Pulse:  96  94  Resp:  20  13  Temp:  98.1 F (36.7 C)  (!) 97.2 F (36.2 C)  TempSrc:  Oral  Oral  SpO2: 98% 96% 97% 99%  Weight:      Height:        Intake/Output Summary (Last 24 hours) at 09/24/2020 1658 Last data filed at 09/24/2020 1400 Gross per 24 hour  Intake 1759.07 ml  Output --  Net 1759.07 ml   Filed Weights   09/21/20 2351  Weight: 47.6 kg    Exam:   General: 85 y.o. year-old female pleasant well-developed well-nourished no acute distress.  Alert oriented x3.  Cardiovascular: Regular rate and rhythm with no rubs or gallops.    Respiratory: Clear to auscultation no wheezes or rales.    Abdomen: Soft nontender normal bowel sounds present.    Musculoskeletal: No lower extremity edema bilaterally.    Psychiatry: Mood is appropriate for condition and setting.  Data Reviewed: CBC: Recent Labs  Lab 09/18/20 1408 09/21/20 1132 09/22/20 0515 09/23/20 0609 09/24/20 0650  WBC 7.8 7.4 6.0 6.8 7.4  NEUTROABS 6.3 5.8  --  5.0 5.4  HGB 12.8 14.0 11.4* 11.1* 11.0*  HCT 38.2 42.2 33.7* 32.4* 33.2*  MCV 95.5 94.6 96.3 94.5 97.1  PLT 233 286 204 186 176   Basic Metabolic Panel: Recent Labs  Lab 09/18/20 1408 09/21/20 1132 09/22/20 0515 09/23/20 0609 09/24/20 0650  NA 137 138 141 141 141  K 4.1 3.1* 2.4* 3.6 4.0  CL 99 99 109 112* 117*  CO2 28 23 20* 18* 18*  GLUCOSE 105* 129* 106* 84 89  BUN 20 40* 32* 24* 17  CREATININE 1.07* 2.37* 1.45* 0.95 0.77  CALCIUM 9.0 8.4* 7.1* 6.8* 6.7*  MG  --   --  1.2* 2.8*  --    GFR: Estimated Creatinine Clearance: 40 mL/min (by C-G formula based on SCr of 0.77 mg/dL). Liver Function Tests: Recent Labs  Lab 09/18/20 1408 09/21/20 1132 09/22/20 0515 09/23/20 0609 09/24/20 0650  AST 25 201* 121* 75* 53*  ALT 21 88* 71* 65* 61*  ALKPHOS 33* 34* 28* 32* 35*  BILITOT 1.0 0.9  0.9 0.7 0.7  PROT 6.2* 6.9 5.1* 4.9* 4.9*  ALBUMIN 3.5 3.4* 2.6* 2.3* 2.4*   No results for input(s): LIPASE, AMYLASE in the last 168 hours. No results for input(s): AMMONIA in the last 168 hours. Coagulation Profile: No results for input(s): INR, PROTIME in the last 168 hours. Cardiac Enzymes: No results for input(s): CKTOTAL, CKMB, CKMBINDEX, TROPONINI in the last 168 hours. BNP (last 3 results) No results for input(s): PROBNP in the last 8760 hours. HbA1C: No results for input(s): HGBA1C in the last 72 hours. CBG: No results for input(s): GLUCAP in the last 168 hours. Lipid Profile: No results for input(s): CHOL, HDL, LDLCALC, TRIG, CHOLHDL, LDLDIRECT in the last 72 hours. Thyroid Function Tests: No results for input(s): TSH, T4TOTAL, FREET4, T3FREE, THYROIDAB in the last 72 hours. Anemia Panel: No results for input(s): VITAMINB12, FOLATE, FERRITIN, TIBC, IRON, RETICCTPCT in the last 72 hours. Urine analysis:    Component Value Date/Time   COLORURINE YELLOW 05/28/2019 [place] 05/28/2019 2225   LABSPEC 1.010 05/28/2019 2225   PHURINE 6.5  05/28/2019 [place] 05/28/2019 2225   HGBUR NEGATIVE 05/28/2019 2225   [place] 05/28/2019 2225   KETONESUR NEGATIVE 05/28/2019 2225   PROTEINUR NEGATIVE 05/28/2019 2225   UROBILINOGEN 1.0 01/06/2013 1029   NITRITE NEGATIVE 05/28/2019 2225   LEUKOCYTESUR SMALL (A) 05/28/2019 2225   Sepsis Labs: @LABRCNTIP (procalcitonin:4,lacticidven:4)  ) Recent Results (from the past 240 hour(s))  Respiratory Panel by RT PCR (Flu A&B, Covid) - Nasopharyngeal Swab     Status: None   Collection Time: 09/21/20 11:32 AM   Specimen: Nasopharyngeal Swab  Result Value Ref Range Status   SARS Coronavirus 2 by RT PCR NEGATIVE NEGATIVE Final    Comment: (NOTE) SARS-CoV-2 target nucleic acids are NOT DETECTED.  The SARS-CoV-2 RNA is generally detectable in upper respiratoy specimens during the acute phase of  infection. The lowest concentration of SARS-CoV-2 viral copies this assay can detect is 131 copies/mL. A negative result does not preclude SARS-Cov-2 infection and should not be used as the sole basis for treatment or other patient management decisions. A negative result may occur with  improper specimen collection/handling, submission of specimen other than nasopharyngeal swab, presence of viral mutation(s) within the areas targeted by this assay, and inadequate number of viral copies (<131 copies/mL). A negative result must be combined with clinical observations, patient history, and epidemiological information. The expected result is Negative.  Fact Sheet for Patients:  PinkCheek.be  Fact Sheet for Healthcare Providers:  GravelBags.it  This test is no t yet approved or cleared by the [place] FDA and  has been authorized for detection and/or diagnosis of SARS-CoV-2 by FDA under an Emergency Use Authorization (EUA). This EUA will remain  in effect (meaning this test can be used) for the duration of the COVID-19 declaration under Section 564(b)(1) of the Act, 21 U.S.C. section 360bbb-3(b)(1), unless the authorization is terminated or revoked sooner.     Influenza A by PCR NEGATIVE NEGATIVE Final   Influenza B by PCR NEGATIVE NEGATIVE Final    Comment: (NOTE) The Xpert Xpress SARS-CoV-2/FLU/RSV assay is intended as an aid in  the diagnosis of influenza from Nasopharyngeal swab specimens and  should not be used as a sole basis for treatment. Nasal washings and  aspirates are unacceptable for Xpert Xpress SARS-CoV-2/FLU/RSV  testing.  Fact Sheet for Patients: PinkCheek.be  Fact Sheet for Healthcare Providers: GravelBags.it  This test is not yet approved or cleared by the [place] FDA and  has been authorized for detection and/or diagnosis of SARS-CoV-2  by  FDA under an Emergency Use Authorization (EUA). This EUA will remain  in effect (meaning this test can be used) for the duration of the  Covid-19 declaration under Section 564(b)(1) of the Act, 21  U.S.C. section 360bbb-3(b)(1), unless the authorization is  terminated or revoked. Performed at [facility], [address] [place], [place] [address]   C Difficile Quick Screen w PCR reflex     Status: Abnormal   Collection Time: 09/21/20  3:35 PM   Specimen: STOOL  Result Value Ref Range Status   C Diff antigen POSITIVE (A) NEGATIVE Final   C Diff toxin POSITIVE (A) NEGATIVE Final   C Diff interpretation Toxin producing C. difficile detected.  Final    Comment: CRITICAL RESULT CALLED TO, READ BACK BY AND VERIFIED WITH: Rosemarie Ax RN [id number] 09/21/20 A BROWNING Performed at [facility], [place] [address], [place], [place] [address]   Gastrointestinal Panel by PCR , Stool  Status: None   Collection Time: 09/21/20  3:35 PM   Specimen: Stool  Result Value Ref Range Status   Campylobacter species NOT DETECTED NOT DETECTED Final   Plesimonas shigelloides NOT DETECTED NOT DETECTED Final   Salmonella species NOT DETECTED NOT DETECTED Final   Yersinia enterocolitica NOT DETECTED NOT DETECTED Final   Vibrio species NOT DETECTED NOT DETECTED Final   Vibrio cholerae NOT DETECTED NOT DETECTED Final   Enteroaggregative E coli (EAEC) NOT DETECTED NOT DETECTED Final   Enteropathogenic E coli (EPEC) NOT DETECTED NOT DETECTED Final   Enterotoxigenic E coli (ETEC) NOT DETECTED NOT DETECTED Final   Shiga like toxin producing E coli (STEC) NOT DETECTED NOT DETECTED Final   Shigella/Enteroinvasive E coli (EIEC) NOT DETECTED NOT DETECTED Final   Cryptosporidium NOT DETECTED NOT DETECTED Final   Cyclospora cayetanensis NOT DETECTED NOT DETECTED Final   Entamoeba histolytica NOT DETECTED NOT DETECTED Final   Giardia lamblia NOT DETECTED NOT DETECTED Final   Adenovirus F40/41 NOT DETECTED  NOT DETECTED Final   Astrovirus NOT DETECTED NOT DETECTED Final   Norovirus GI/GII NOT DETECTED NOT DETECTED Final   Rotavirus A NOT DETECTED NOT DETECTED Final   Sapovirus (I, II, IV, and V) NOT DETECTED NOT DETECTED Final    Comment: Performed at [facility], [address], [place], [place] [address]      Studies: No results found.  Scheduled Meds:  amLODipine  5 mg Oral Daily   arformoterol  15 mcg Nebulization BID   enoxaparin (LOVENOX) injection  30 mg Subcutaneous Q24H   fidaxomicin  200 mg Oral BID   meloxicam  15 mg Oral Daily   mirtazapine  15 mg Oral QHS   mupirocin cream   Topical Daily   potassium chloride  40 mEq Oral BID   simvastatin  20 mg Oral Q M,W,F   umeclidinium bromide  1 puff Inhalation Daily    Continuous Infusions:  sodium chloride 50 mL/hr at 09/24/20 1147     LOS: 3 days     Kayleen Memos, MD [facility] Pager [phone]  If 7PM-7AM, please contact night-coverage [email or web address] Password [facility] 09/24/2020, 4:58 PM

## 2020-09-24 NOTE — Evaluation (Addendum)
Physical Therapy Evaluation Patient Details Name: Dana [name] MRN: [id number] DOB: 29-Aug-1935 Today's Date: 09/24/2020   History of Present Illness  85 y.o. female with medical history significant for COPD, NSCLC in remission since 2010 on Tarceva, osteoporosis, HTN, depression.  Pt arrives to ED on 9/29 with c-diff, cholelithiasis.  Clinical Impression   Pt presents with generalized weakness, impaired standing balance with history of falls, impaired activity tolerance, and increased time and effort to mobilize vs baseline. Pt to benefit from acute PT to address deficits. Pt ambulated room distance with use of SL support and min assist from PT, otherwise pt reaching for environment to steady self. Pt limited by dyspnea and LE fatigue during mobility this day. Pt instructed in LE exercises for strengthening, PT recommending OOB daily with RN staff to improve strength and activity tolerance, and PT to continue to follow acutely. This PT spoke with pt's daughter via telephone at 59 on 10/2, and pt's daughter expresses concern for pt's high fall risk and significant weakness vs baseline. Per pt's daughter, pt fell last week and had significant difficulty getting up. Pt and family wish for ST-SNF placement post-acutely, PT in agreement with plan. PT to progress mobility as tolerated.       Follow Up Recommendations SNF;Supervision/Assistance - 24 hour    Equipment Recommendations  None recommended by PT    Recommendations for Other Services       Precautions / Restrictions Precautions Precautions: Fall Restrictions Weight Bearing Restrictions: No      Mobility  Bed Mobility Overal bed mobility: Needs Assistance Bed Mobility: Supine to Sit     Supine to sit: HOB elevated;Supervision     General bed mobility comments: supervision for safety, increased time and effort.  Transfers Overall transfer level: Needs assistance Equipment used: None Transfers: Sit to/from Stand Sit  to Stand: Min guard         General transfer comment: for safety, increased time to rise and steady. Pt reaching or IV pole once standing.  Ambulation/Gait Ambulation/Gait assistance: Min guard Gait Distance (Feet): 35 Feet Assistive device: IV Pole Gait Pattern/deviations: Step-through pattern;Decreased stride length;Trunk flexed Gait velocity: decr   General Gait Details: Min guard for safety, occasional min assist to steady or assist pt in navigating room. Pt ambulatory to and from bathroom and around the room, limited by fatigue and DOE 2/4.  Stairs            Wheelchair Mobility    Modified Rankin (Stroke Patients Only)       Balance Overall balance assessment: Needs assistance;History of Falls Sitting-balance support: No upper extremity supported;Feet supported Sitting balance-Leahy Scale: Fair     Standing balance support: Single extremity supported Standing balance-Leahy Scale: Poor Standing balance comment: reliant on external assist, uses cane at baseline                             Pertinent Vitals/Pain Pain Assessment: No/denies pain    Home Living Family/patient expects to be discharged to:: Private residence Living Arrangements: Spouse/significant other Available Help at Discharge: Family Type of Home: House (townhouse) Home Access: Stairs to enter   Technical brewer of Steps: "just a little one" Home Layout: One level Home Equipment: Walker - 2 wheels;Cane - single point      Prior Function Level of Independence: Independent with assistive device(s)         Comments: pt ambulatory with cane     Hand  Dominance   Dominant Hand: Right    Extremity/Trunk Assessment   Upper Extremity Assessment Upper Extremity Assessment: Generalized weakness    Lower Extremity Assessment Lower Extremity Assessment: Generalized weakness    Cervical / Trunk Assessment Cervical / Trunk Assessment: Normal  Communication    Communication: No difficulties  Cognition Arousal/Alertness: Awake/alert Behavior During Therapy: WFL for tasks assessed/performed Overall Cognitive Status: Within Functional Limits for tasks assessed                                        General Comments      Exercises General Exercises - Lower Extremity Long Arc Quad: AROM;Both;15 reps;Seated Hip ABduction/ADduction: AROM;Both;10 reps;Seated   Assessment/Plan    PT Assessment Patient needs continued PT services  PT Problem List Decreased strength;Decreased mobility;Decreased balance;Decreased knowledge of use of DME;Decreased activity tolerance       PT Treatment Interventions DME instruction;Therapeutic activities;Gait training;Therapeutic exercise;Patient/family education;Balance training;Stair training;Functional mobility training;Neuromuscular re-education    PT Goals (Current goals can be found in the Care Plan section)  Acute Rehab PT Goals Patient Stated Goal: feel better PT Goal Formulation: With patient Time For Goal Achievement: 10/08/20 Potential to Achieve Goals: Good    Frequency Min 2X/week   Barriers to discharge        Co-evaluation               AM-PAC PT "6 Clicks" Mobility  Outcome Measure Help needed turning from your back to your side while in a flat bed without using bedrails?: A Little Help needed moving from lying on your back to sitting on the side of a flat bed without using bedrails?: A Little Help needed moving to and from a bed to a chair (including a wheelchair)?: A Little Help needed standing up from a chair using your arms (e.g., wheelchair or bedside chair)?: A Little Help needed to walk in hospital room?: A Little Help needed climbing 3-5 steps with a railing? : A Lot 6 Click Score: 17    End of Session Equipment Utilized During Treatment: Gait belt Activity Tolerance: Patient limited by fatigue Patient left: in chair;with chair alarm set;with call  bell/phone within reach Nurse Communication: Mobility status PT Visit Diagnosis: Difficulty in walking, not elsewhere classified (R26.2);Unsteadiness on feet (R26.81)    Time: 1638-4536 PT Time Calculation (min) (ACUTE ONLY): 25 min   Charges:   PT Evaluation $PT Eval Low Complexity: 1 Low PT Treatments $Gait Training: 8-22 mins        Annetta Deiss E, PT Acute Rehabilitation Services Pager [phone]  Office [phone]   Miray Mancino D Elonda Husky 09/24/2020, 1:36 PM

## 2020-09-25 DIAGNOSIS — A0472 Enterocolitis due to Clostridium difficile, not specified as recurrent: Principal | ICD-10-CM

## 2020-09-25 LAB — COMPREHENSIVE METABOLIC PANEL
ALT: 57 U/L — ABNORMAL HIGH (ref 0–44)
AST: 40 U/L (ref 15–41)
Albumin: 2.4 g/dL — ABNORMAL LOW (ref 3.5–5.0)
Alkaline Phosphatase: 32 U/L — ABNORMAL LOW (ref 38–126)
Anion gap: 6 (ref 5–15)
BUN: 12 mg/dL (ref 8–23)
CO2: 21 mmol/L — ABNORMAL LOW (ref 22–32)
Calcium: 6.7 mg/dL — ABNORMAL LOW (ref 8.9–10.3)
Chloride: 116 mmol/L — ABNORMAL HIGH (ref 98–111)
Creatinine, Ser: 0.69 mg/dL (ref 0.44–1.00)
GFR calc Af Amer: >60 mL/min (ref >60)
GFR calc non Af Amer: >60 mL/min (ref >60)
Glucose, Bld: 93 mg/dL (ref 70–99)
Potassium: 3.7 mmol/L (ref 3.5–5.1)
Sodium: 143 mmol/L (ref 135–145)
Total Bilirubin: 0.7 mg/dL (ref 0.3–1.2)
Total Protein: 4.6 g/dL — ABNORMAL LOW (ref 6.5–8.1)

## 2020-09-25 NOTE — Progress Notes (Signed)
[facility] Progress Note Covering for Drs. Hung/Mann this weekend    Since last GI note: 2-3 loose stools overnight and 2-3 since then already. Non bloody.   She is tolerating solid foods without n/v  Objective: Vital signs in last 24 hours: Temp:  [97.2 F (36.2 C)-98.7 F (37.1 C)] 97.2 F (36.2 C) (10/03 1216) Pulse Rate:  [94-102] 98 (10/03 1216) Resp:  [13-19] 19 (10/03 1216) BP: (109-137)/(69-81) 109/69 (10/03 1216) SpO2:  [92 %-99 %] 97 % (10/03 1216) Last BM Date: 09/24/20 General: alert and oriented times 3 Heart: regular rate and rythm Abdomen: soft, non-tender, non-distended, normal bowel sounds   Lab Results: Recent Labs    09/23/20 0609 09/24/20 0650  WBC 6.8 7.4  HGB 11.1* 11.0*  PLT 186 239  MCV 94.5 97.1   Recent Labs    09/23/20 0609 09/24/20 0650 09/25/20 0635  NA 141 141 143  K 3.6 4.0 3.7  CL 112* 117* 116*  CO2 18* 18* 21*  GLUCOSE 84 89 93  BUN 24* 17 12  CREATININE 0.95 0.77 0.69  CALCIUM 6.8* 6.7* 6.7*   Recent Labs    09/23/20 0609 09/24/20 0650 09/25/20 0635  PROT 4.9* 4.9* 4.6*  ALBUMIN 2.3* 2.4* 2.4*  AST 75* 53* 40  ALT 65* 61* 57*  ALKPHOS 32* 35* 32*  BILITOT 0.7 0.7 0.7   Medications: Scheduled Meds: . amLODipine  5 mg Oral Daily  . arformoterol  15 mcg Nebulization BID  . enoxaparin (LOVENOX) injection  30 mg Subcutaneous Q24H  . feeding supplement (ENSURE ENLIVE)  237 mL Oral BID BM  . fidaxomicin  200 mg Oral BID  . meloxicam  15 mg Oral Daily  . mirtazapine  15 mg Oral QHS  . mupirocin cream   Topical Daily  . potassium chloride  40 mEq Oral BID  . simvastatin  20 mg Oral Q M,W,F  . umeclidinium bromide  1 puff Inhalation Daily   Continuous Infusions: . sodium chloride 50 mL/hr at 09/25/20 0727   PRN Meds:.acetaminophen **OR** acetaminophen, albuterol, lip balm, loratadine, ondansetron **OR** ondansetron (ZOFRAN) IV  Assessment/Plan: 85 y.o. female with C. Diff + diarrhea  She is  starting her third day of dificid 200mg  BID today.  If diarrhea not significantly improving in the new 1-2 days will need to consider other options (vanc?)  LFTs continue to improve. No need for further check, this can be worked up further as an Marketing executive.  Her daughter paged me directly this morning and asked about stool transplant.  I believe those are not being done currently due to Covid restrictions.  Milus Banister, MD  09/25/2020, 1:01 PM [facility] Pager [phone]

## 2020-09-25 NOTE — Progress Notes (Signed)
[name]:[id number] DOB: Dec 11, 1935 DOA: 09/21/2020 PCP: [name], MD  HPI/Recap of past 24 hours: Dana [name] is a 85 y.o. female with medical history significant for COPD, NSCLC in remission since 2010 on Tarceva.  Presented to [facility] ED due to intractable diarrhea for the past 4 days, not responding to Imodium.  Got COVID booster 09/18/20.  Shortly after that had onset of diarrhea.  H/o c.diff in 2012.  Has had intermittent diarrhea for the past 10 years on tarceva but usually this gets better with imodium.  No recent ABx, no recent travel, no blood in stool.  ED Course: Mild LFT elevations, Creat 2.3 up from 1.0 on 09/18/20.  WBC nl.  BP soft, received 2L LR bolus.  Lactic acid 2.4 and 2.2.  AST 201, ALT 88 up from 25 and 21 on 09/18/20.  CT abd pelvis without contrast done on 09/21/2020 showed: 1. Cholelithiasis with interval development of a hydropic gallbladder and prominent common bile duct that does not taper to normal a caliber distally. Findings concerning for choledocholithiasis. Consider right upper quadrant ultrasound or MRCP for further evaluation. 2. Findings consistent with a rapid bowel transit time. No definite colitis.  Stool C.diff PCR is positive on 09/21/2020, GI panel by PCR negative.  09/25/20: Seen and examined at the bedside.  She states she feels better this morning.  Reports her stools are less watery.  Denies any significant abdominal pain or nausea.  She is tolerating a diet.  Assessment/Plan: Principal Problem:   AKI (acute kidney injury) ([place]) Active Problems:   Cancer of right lung parenchyma (HCC)   COPD GOLD 0/ lama/laba responsive   Choledocholithiasis   Diarrhea   Hypotension   C. difficile diarrhea  Persistent intractable diarrhea 2/2 to C-diff infection, POA Presented with diarrhea of 4 days duration not improved with home Imodium Hx of C-diff in 2012 C-diff PCR positive on 09/21/20 Started on po vancomycin  with taper, completed 2 days Switched to Dificid on 09/23/2020, 200 mg twice daily, day#3/10 days. Continue IV fluids to avoid dehydration Encourage oral intake  Non-small cell lung cancer in remission since 2010 Continue to hold off Tarceva as recommended by oncology in the next 7 days from 09/23/2020 Will need to follow-up with oncology posthospitalization.  Distended gallbladder with sludge and gallstones Right upper quadrant ultrasound done on 09/22/2020 revealed: 1. Distended gallbladder with sludge and small gallstones. No sonographic findings of acute cholecystitis. 2. Intrahepatic and extrahepatic biliary dilatation. No visualized Choledocholithiasis. GI consulted, management per GI. LFTs are downtrending  Elevated liver chemistries, improving LFTs are downtrending. Avoid hepatotoxic agents and continue to monitor GI assisting with the management  Resolved post repletion: Hypokalemia, secondary to GI losses from diarrhea Presented with serum potassium of 2.4 Serum potassium this morning 3.6  Resolved post repletion: Hypomagnesemia secondary to GI losses from diarrhea Presented with serum magnesium of 1.2 Magnesium 2.8 this morning.  Physical debility PT assessment recommended SNF Fall precautions TOC consulted to assist with SNF placement. She is receptive to going to rehab first prior to returning home.  Moderate protein calorie malnutrition BMI 16 Albumin 2.4 Start oral supplement Encourage increase in protein calorie intake.    Code Status: Full code  Family Communication: None at bedside  Disposition Plan: From home, PT recommending SNF.   Consultants:  GI  Procedures:  None  Antimicrobials:  P.o. vancomycin started on 09/22/2020.  Started on 09/21/2020 and discontinued on 09/22/2020.  DVT prophylaxis: Subcu Lovenox  daily  Status is: Inpatient   Dispo:  Patient From: Home  Planned Disposition: [name]  Expected  discharge date: 09/26/20  Medically stable for discharge: No, ongoing management of C. difficile diarrhea, on IV fluids to avoid dehydration.         Objective: Vitals:   09/24/20 1933 09/24/20 2135 09/25/20 0446 09/25/20 1216  BP:  131/81 129/76 109/69  Pulse:  (!) 102 98 98  Resp:  16  19  Temp:  98.7 F (37.1 C) 98.2 F (36.8 C) (!) 97.2 F (36.2 C)  TempSrc:  Oral Oral Oral  SpO2: 99% 92% 93% 97%  Weight:      Height:        Intake/Output Summary (Last 24 hours) at 09/25/2020 1330 Last data filed at 09/24/2020 1800 Gross per 24 hour  Intake 355.34 ml  Output --  Net 355.34 ml   Filed Weights   09/21/20 2351  Weight: 47.6 kg    Exam: No significant changes from prior exam.  . General: 85 y.o. year-old female pleasant well-developed well-nourished no acute distress.  Alert oriented x3. . Cardiovascular: Regular rate and rhythm with no rubs or gallops.   Marland Kitchen Respiratory: Clear to auscultation no wheezes or rales.   . Abdomen: Soft nontender normal bowel sounds present.   . Musculoskeletal: No lower extremity edema bilaterally.   Marland Kitchen Psychiatry: Mood is appropriate for condition and setting.  Data Reviewed: CBC: Recent Labs  Lab 09/18/20 1408 09/21/20 1132 09/22/20 0515 09/23/20 0609 09/24/20 0650  WBC 7.8 7.4 6.0 6.8 7.4  NEUTROABS 6.3 5.8  --  5.0 5.4  HGB 12.8 14.0 11.4* 11.1* 11.0*  HCT 38.2 42.2 33.7* 32.4* 33.2*  MCV 95.5 94.6 96.3 94.5 97.1  PLT 233 286 204 186 952   Basic Metabolic Panel: Recent Labs  Lab 09/21/20 1132 09/22/20 0515 09/23/20 0609 09/24/20 0650 09/25/20 0635  NA 138 141 141 141 143  K 3.1* 2.4* 3.6 4.0 3.7  CL 99 109 112* 117* 116*  CO2 23 20* 18* 18* 21*  GLUCOSE 129* 106* 84 89 93  BUN 40* 32* 24* 17 12  CREATININE 2.37* 1.45* 0.95 0.77 0.69  CALCIUM 8.4* 7.1* 6.8* 6.7* 6.7*  MG  --  1.2* 2.8*  --   --    GFR: Estimated Creatinine Clearance: 40 mL/min (by C-G formula based on SCr of 0.69 mg/dL). Liver Function  Tests: Recent Labs  Lab 09/21/20 1132 09/22/20 0515 09/23/20 0609 09/24/20 0650 09/25/20 0635  AST 201* 121* 75* 53* 40  ALT 88* 71* 65* 61* 57*  ALKPHOS 34* 28* 32* 35* 32*  BILITOT 0.9 0.9 0.7 0.7 0.7  PROT 6.9 5.1* 4.9* 4.9* 4.6*  ALBUMIN 3.4* 2.6* 2.3* 2.4* 2.4*   No results for input(s): LIPASE, AMYLASE in the last 168 hours. No results for input(s): AMMONIA in the last 168 hours. Coagulation Profile: No results for input(s): INR, PROTIME in the last 168 hours. Cardiac Enzymes: No results for input(s): CKTOTAL, CKMB, CKMBINDEX, TROPONINI in the last 168 hours. BNP (last 3 results) No results for input(s): PROBNP in the last 8760 hours. HbA1C: No results for input(s): HGBA1C in the last 72 hours. CBG: No results for input(s): GLUCAP in the last 168 hours. Lipid Profile: No results for input(s): CHOL, HDL, LDLCALC, TRIG, CHOLHDL, LDLDIRECT in the last 72 hours. Thyroid Function Tests: No results for input(s): TSH, T4TOTAL, FREET4, T3FREE, THYROIDAB in the last 72 hours. Anemia Panel: No results for input(s): VITAMINB12, FOLATE,  FERRITIN, TIBC, IRON, RETICCTPCT in the last 72 hours. Urine analysis:    Component Value Date/Time   COLORURINE YELLOW 05/28/2019 [place] 05/28/2019 2225   LABSPEC 1.010 05/28/2019 2225   PHURINE 6.5 05/28/2019 2225   GLUCOSEU NEGATIVE 05/28/2019 2225   HGBUR NEGATIVE 05/28/2019 2225   BILIRUBINUR NEGATIVE 05/28/2019 2225   KETONESUR NEGATIVE 05/28/2019 2225   PROTEINUR NEGATIVE 05/28/2019 2225   UROBILINOGEN 1.0 01/06/2013 1029   NITRITE NEGATIVE 05/28/2019 2225   LEUKOCYTESUR SMALL (A) 05/28/2019 2225   Sepsis Labs: @LABRCNTIP (procalcitonin:4,lacticidven:4)  ) Recent Results (from the past 240 hour(s))  Respiratory Panel by RT PCR (Flu A&B, Covid) - Nasopharyngeal Swab     Status: None   Collection Time: 09/21/20 11:32 AM   Specimen: Nasopharyngeal Swab  Result Value Ref Range Status   SARS Coronavirus 2 by RT  PCR NEGATIVE NEGATIVE Final    Comment: (NOTE) SARS-CoV-2 target nucleic acids are NOT DETECTED.  The SARS-CoV-2 RNA is generally detectable in upper respiratoy specimens during the acute phase of infection. The lowest concentration of SARS-CoV-2 viral copies this assay can detect is 131 copies/mL. A negative result does not preclude SARS-Cov-2 infection and should not be used as the sole basis for treatment or other patient management decisions. A negative result may occur with  improper specimen collection/handling, submission of specimen other than nasopharyngeal swab, presence of viral mutation(s) within the areas targeted by this assay, and inadequate number of viral copies (<131 copies/mL). A negative result must be combined with clinical observations, patient history, and epidemiological information. The expected result is Negative.  Fact Sheet for Patients:  PinkCheek.be  Fact Sheet for Healthcare Providers:  GravelBags.it  This test is no t yet approved or cleared by the [place] FDA and  has been authorized for detection and/or diagnosis of SARS-CoV-2 by FDA under an Emergency Use Authorization (EUA). This EUA will remain  in effect (meaning this test can be used) for the duration of the COVID-19 declaration under Section 564(b)(1) of the Act, 21 U.S.C. section 360bbb-3(b)(1), unless the authorization is terminated or revoked sooner.     Influenza A by PCR NEGATIVE NEGATIVE Final   Influenza B by PCR NEGATIVE NEGATIVE Final    Comment: (NOTE) The Xpert Xpress SARS-CoV-2/FLU/RSV assay is intended as an aid in  the diagnosis of influenza from Nasopharyngeal swab specimens and  should not be used as a sole basis for treatment. Nasal washings and  aspirates are unacceptable for Xpert Xpress SARS-CoV-2/FLU/RSV  testing.  Fact Sheet for Patients: PinkCheek.be  Fact Sheet for  Healthcare Providers: GravelBags.it  This test is not yet approved or cleared by the [place] FDA and  has been authorized for detection and/or diagnosis of SARS-CoV-2 by  FDA under an Emergency Use Authorization (EUA). This EUA will remain  in effect (meaning this test can be used) for the duration of the  Covid-19 declaration under Section 564(b)(1) of the Act, 21  U.S.C. section 360bbb-3(b)(1), unless the authorization is  terminated or revoked. Performed at [facility], [address] [place], [place] [address]   C Difficile Quick Screen w PCR reflex     Status: Abnormal   Collection Time: 09/21/20  3:35 PM   Specimen: STOOL  Result Value Ref Range Status   C Diff antigen POSITIVE (A) NEGATIVE Final   C Diff toxin POSITIVE (A) NEGATIVE Final   C Diff interpretation Toxin producing C. difficile detected.  Final    Comment:  CRITICAL RESULT CALLED TO, READ BACK BY AND VERIFIED WITH: Rosemarie Ax RN [id number] 09/21/20 A BROWNING Performed at [facility], [place] [address], [place], [place] [address]   Gastrointestinal Panel by PCR , Stool     Status: None   Collection Time: 09/21/20  3:35 PM   Specimen: Stool  Result Value Ref Range Status   Campylobacter species NOT DETECTED NOT DETECTED Final   Plesimonas shigelloides NOT DETECTED NOT DETECTED Final   Salmonella species NOT DETECTED NOT DETECTED Final   Yersinia enterocolitica NOT DETECTED NOT DETECTED Final   Vibrio species NOT DETECTED NOT DETECTED Final   Vibrio cholerae NOT DETECTED NOT DETECTED Final   Enteroaggregative E coli (EAEC) NOT DETECTED NOT DETECTED Final   Enteropathogenic E coli (EPEC) NOT DETECTED NOT DETECTED Final   Enterotoxigenic E coli (ETEC) NOT DETECTED NOT DETECTED Final   Shiga like toxin producing E coli (STEC) NOT DETECTED NOT DETECTED Final   Shigella/Enteroinvasive E coli (EIEC) NOT DETECTED NOT DETECTED Final   Cryptosporidium NOT DETECTED NOT DETECTED  Final   Cyclospora cayetanensis NOT DETECTED NOT DETECTED Final   Entamoeba histolytica NOT DETECTED NOT DETECTED Final   Giardia lamblia NOT DETECTED NOT DETECTED Final   Adenovirus F40/41 NOT DETECTED NOT DETECTED Final   Astrovirus NOT DETECTED NOT DETECTED Final   Norovirus GI/GII NOT DETECTED NOT DETECTED Final   Rotavirus A NOT DETECTED NOT DETECTED Final   Sapovirus (I, II, IV, and V) NOT DETECTED NOT DETECTED Final    Comment: Performed at [facility], [address], [place], [place] [address]      Studies: No results found.  Scheduled Meds: . amLODipine  5 mg Oral Daily  . arformoterol  15 mcg Nebulization BID  . enoxaparin (LOVENOX) injection  30 mg Subcutaneous Q24H  . feeding supplement (ENSURE ENLIVE)  237 mL Oral BID BM  . fidaxomicin  200 mg Oral BID  . meloxicam  15 mg Oral Daily  . mirtazapine  15 mg Oral QHS  . mupirocin cream   Topical Daily  . potassium chloride  40 mEq Oral BID  . simvastatin  20 mg Oral Q M,W,F  . umeclidinium bromide  1 puff Inhalation Daily    Continuous Infusions: . sodium chloride 50 mL/hr at 09/25/20 0727     LOS: 4 days     Kayleen Memos, MD [facility] Pager [phone]  If 7PM-7AM, please contact night-coverage [email or web address] Password [email or web address] 09/25/2020, 1:30 PM

## 2020-09-26 LAB — COMPREHENSIVE METABOLIC PANEL
ALT: 56 U/L — ABNORMAL HIGH (ref 0–44)
AST: 38 U/L (ref 15–41)
Albumin: 2.4 g/dL — ABNORMAL LOW (ref 3.5–5.0)
Alkaline Phosphatase: 32 U/L — ABNORMAL LOW (ref 38–126)
Anion gap: 8 (ref 5–15)
BUN: 10 mg/dL (ref 8–23)
CO2: 20 mmol/L — ABNORMAL LOW (ref 22–32)
Calcium: 6.8 mg/dL — ABNORMAL LOW (ref 8.9–10.3)
Chloride: 113 mmol/L — ABNORMAL HIGH (ref 98–111)
Creatinine, Ser: 0.7 mg/dL (ref 0.44–1.00)
GFR calc Af Amer: >60 mL/min (ref >60)
GFR calc non Af Amer: >60 mL/min (ref >60)
Glucose, Bld: 95 mg/dL (ref 70–99)
Potassium: 3.8 mmol/L (ref 3.5–5.1)
Sodium: 141 mmol/L (ref 135–145)
Total Bilirubin: 0.7 mg/dL (ref 0.3–1.2)
Total Protein: 4.8 g/dL — ABNORMAL LOW (ref 6.5–8.1)

## 2020-09-26 MED ORDER — SIMETHICONE 80 MG PO CHEW
80.0000 mg | CHEWABLE_TABLET | Freq: Four times a day (QID) | ORAL | Status: AC
  Administered 2020-09-26 – 2020-09-28 (×6): 80 mg via ORAL
  Filled 2020-09-26 (×6): qty 1

## 2020-09-26 MED ORDER — BOOST / RESOURCE BREEZE PO LIQD CUSTOM
1.0000 | Freq: Three times a day (TID) | ORAL | Status: DC
  Administered 2020-09-26 – 2020-09-28 (×3): 1 via ORAL

## 2020-09-26 MED ORDER — ADULT MULTIVITAMIN W/MINERALS CH
1.0000 | ORAL_TABLET | Freq: Every day | ORAL | Status: DC
  Administered 2020-09-26 – 2020-09-28 (×3): 1 via ORAL
  Filled 2020-09-26 (×3): qty 1

## 2020-09-26 MED ORDER — SACCHAROMYCES BOULARDII 250 MG PO CAPS
250.0000 mg | ORAL_CAPSULE | Freq: Two times a day (BID) | ORAL | Status: DC
  Administered 2020-09-26 – 2020-09-28 (×5): 250 mg via ORAL
  Filled 2020-09-26 (×5): qty 1

## 2020-09-26 NOTE — Discharge Instructions (Signed)
Suggestions For Increasing Calories And Protein    Several small meals a day are easier to eat and digest than three large ones. Space meals about 2 to 3 hours apart to maximize comfort.  Stop eating 2 to 3 hours before bed and sleep with your head elevated if gastric reflux (GERD) and heartburn are problems.  Do not eat your favorite foods if you are feeling bad. Save them for when you feel good!  Eat breakfast-type foods at any meal. Eggs are usually easy to eat and are great any time of the day. (The same goes for pancakes and waffles.)  Eat when you feel hungry. Most people have the greatest appetite in the morning because they have not eaten all night. If this is the best meal for you, then pile on those calories and other nutrients in the morning and at lunch. Then you can have a smaller dinner without losing total calories for the day.  Eat leftovers or nutritious snacks in the afternoon and early evening to round out your day.  Try homemade or commercially prepared nutrition bars and puddings, as well as calorie- and protein-rich liquid nutritional supplements.  Tips . Add extra egg to one or more meals  . Include [place] yogurt or cottage cheese for snack or part of a meal  . Increase portion size of protein entre and decrease portion of starch/bread  . Mix protein powder, nut butter, almond/nut milk, non-fat dry milk, or Greek yogurt to shakes and smoothies  . Use these ingredients also in baked goods or other recipes . Use double the amount of sandwich filling  . Add protein foods to all snacks including cheese, nut butters, milk and yogurt  Food Tips for Including Protein  Beans . Cook and use dried peas, beans, and tofu in soups or add to casseroles, pastas, and grain dishes that also contain cheese or meat  . Mash with cheese and milk  . Use tofu to make smoothies  Commercial Protein Supplements . Use nutritional supplements or protein powder sold at pharmacies and grocery  stores  . Use protein powder in milk drinks and desserts, such as pudding  . Mix with ice cream, milk, and fruit or other flavorings for a high-protein milkshake  Cottage Cheese or [employer] . Mix with or use to stuff fruits and vegetables  . Add to casseroles, spaghetti, noodles, or egg dishes such as omelets, scrambled eggs, and souffls  . Use gelatin, pudding-type desserts, cheesecake, and pancake or waffle batter  . Use to stuff crepes, pasta shells, or manicotti  . Puree and use as a substitute for sour cream  Eggs, Egg whites, and Egg Yolks . Add chopped, hard-cooked eggs to salads and dressings, vegetables, casseroles, and creamed meats  . Beat eggs into mashed potatoes, vegetable purees, and sauces  . Add extra egg whites to quiches, scrambled eggs, custards, puddings, pancake batter, or [place]  . Make a rich custard with egg yolks, double strength milk, and sugar  . Add extra hard-cooked yolks to deviled egg filling and sandwich spreads  Hard or Semi-Soft Cheese (Cheddar, Sherley Bounds) . Melt on sandwiches, bread, muffins, tortillas, hamburgers, hot dogs, other meats or fish, vegetables, eggs, or desserts such as stewed figs or pies  . Grate and add to soups, sauces, casseroles, vegetable dishes, potatoes, rice noodles, or meatloaf  . Serve as a snack with crackers or bagels  Ice cream, Yogurt, and Frozen Yogurt . Add to milk drinks such  as milkshakes  . Add to cereals, fruits, gelatin desserts, and pies  . Blend or whip with soft or cooked fruits  . Sandwich ice cream or frozen yogurt between enriched cake slices, cookies, or graham crackers  . Use seasoned yogurt as a dip for fruits, vegetables, or chips  . Use yogurt in place of sour cream in casseroles  Meat and Fish . Add chopped, cooked meat or fish to vegetables, salads, casseroles, soups, sauces, and biscuit dough  . Use in omelets, souffls, quiches, and sandwich fillings  . Add chicken and [place] to  stuffing  . Wrap in pie crust or biscuit dough as turnovers  . Add to stuffed baked potatoes  . Add pureed meat to soups  Milk . Use in beverages and in cooking  . Use in preparing foods, such as hot cereal, soups, cocoa, or pudding  . Add cream sauces to vegetable and other dishes  . Use evaporated milk, evaporated skim milk, or sweetened condensed milk instead of milk or water in recipes.  Nonfat Dry Milk . Add 1/3 cup of nonfat dry milk powdered milk to each cup of regular milk for "double strength" milk  . Add to yogurt and milk drinks, such as pasteurized eggnog and milkshakes  . Add to scrambled eggs and mashed potatoes  . Use in casseroles, meatloaf, hot cereal, breads, muffins, sauces, cream soups, puddings and custards, and other milk-based desserts  Nuts, Seeds, and Wheat Germ . Add to casseroles, breads, muffins, pancakes, cookies, and waffles  . Sprinkle on fruit, cereal, ice cream, yogurt, vegetables, salads, and toast as a crunchy topping  . Use in place of breadcrumbs  . Blend with parsley or spinach, herbs, and cream for a noodle, pasta, or vegetable sauce.  . Roll banana in chopped nuts  Peanut Butter . Spread on sandwiches, toast, muffins, crackers, waffles, pancakes, and fruit slices  . Use as a dip for raw vegetables, such as carrots, cauliflower, and celery  . Blend with milk drinks, smoothies, and other beverages  . Swirl through soft ice cream or yogurt  . Spread on a banana then roll in crushed, dry cereal or chopped nuts   Copyright 2020  Academy of Nutrition and Dietetics. All rights reserved

## 2020-09-26 NOTE — NC FL2 (Signed)
[place] LEVEL OF CARE SCREENING TOOL     IDENTIFICATION  Patient Name: Dana [name] Birthdate: 09-Sep-1935 Sex: female Admission Date (Current Location): 09/21/2020  [facility] and [place] Number:  Herbalist and Address:  [facility],  [place] [address], [place]      Provider Number: [id number]  Attending Physician Name and Address:  Kayleen Memos, DO  Relative Name and Phone Number:       Current Level of Care: Hospital Recommended Level of Care: [place] Prior Approval Number:    Date Approved/Denied:   PASRR Number:    Discharge Plan: SNF    Current Diagnoses: Patient Active Problem List   Diagnosis Date Noted  . AKI (acute kidney injury) ([place]) 09/22/2020  . Diarrhea 09/22/2020  . Hypotension 09/22/2020  . C. difficile diarrhea 09/22/2020  . Choledocholithiasis 09/21/2020  . COPD GOLD 0/ lama/laba responsive 07/11/2019  . GERD clinical dx only  07/11/2019  . HTN (hypertension) 10/10/2017  . Imbalance 06/04/2017  . Depression 04/04/2017  . Left ankle injury, initial encounter 02/28/2017  . Encounter for therapeutic drug monitoring 10/01/2016  . DOE (dyspnea on exertion) 07/05/2016  . Pleural effusion, right 07/13/2014  . Right arm cellulitis 02/03/2014  . Cancer of right lung parenchyma ([place]) 11/12/2011    Orientation RESPIRATION BLADDER Height & Weight     Self, Time, Situation, Place  Normal Continent Weight: 47.6 kg Height:  5\' 6"  (167.6 cm)  BEHAVIORAL SYMPTOMS/MOOD NEUROLOGICAL BOWEL NUTRITION STATUS      Continent Diet  AMBULATORY STATUS COMMUNICATION OF NEEDS Skin   Limited Assist Verbally Skin abrasions                       Personal Care Assistance Level of Assistance  Bathing, Dressing Bathing Assistance: Limited assistance   Dressing Assistance: Limited assistance     Functional Limitations Info  Sight Sight Info: Impaired        SPECIAL CARE FACTORS FREQUENCY   PT (By licensed PT), OT (By licensed OT)     PT Frequency: 5 x weekly OT Frequency: 5 x weekly            Contractures Contractures Info: Not present    Additional Factors Info  Code Status, Allergies Code Status Info: Full Allergies Info: Amoxicillin, Protonix           Current Medications (09/26/2020):  This is the current hospital active medication list Current Facility-Administered Medications  Medication Dose Route Frequency Provider Last Rate Last Admin  . 0.9 %  sodium chloride infusion   Intravenous Continuous Milus Banister, MD 50 mL/hr at 09/26/20 0332 New Bag at 09/26/20 0332  . acetaminophen (TYLENOL) tablet 650 mg  650 mg Oral Q6H PRN Etta Quill, DO       Or  . acetaminophen (TYLENOL) suppository 650 mg  650 mg Rectal Q6H PRN Etta Quill, DO      . albuterol (PROVENTIL) (2.5 MG/3ML) 0.083% nebulizer solution 2.5 mg  2.5 mg Nebulization Q6H PRN Irene Pap N, DO      . amLODipine (NORVASC) tablet 5 mg  5 mg Oral Daily [place], Carole N, DO   5 mg at 09/26/20 1046  . arformoterol (BROVANA) nebulizer solution 15 mcg  15 mcg Nebulization BID Etta Quill, DO   15 mcg at 09/26/20 9935  . enoxaparin (LOVENOX) injection 30 mg  30 mg Subcutaneous Q24H Etta Quill, DO  30 mg at 09/26/20 1045  . feeding supplement (BOOST / RESOURCE BREEZE) liquid 1 Container  1 Container Oral TID BM Hall, Carole N, DO      . fidaxomicin (DIFICID) tablet 200 mg  200 mg Oral BID Irene Pap N, DO   200 mg at 09/26/20 1046  . lip balm (CARMEX) ointment   Topical PRN Kayleen Memos, DO   Given at 09/22/20 1119  . loratadine (CLARITIN) tablet 10 mg  10 mg Oral Daily PRN Etta Quill, DO      . meloxicam [facility]) tablet 15 mg  15 mg Oral Daily Jennette Kettle M, DO   15 mg at 09/26/20 1046  . mirtazapine (REMERON) tablet 15 mg  15 mg Oral QHS Jennette Kettle M, DO   15 mg at 09/25/20 2051  . multivitamin with minerals tablet 1 tablet  1 tablet Oral Daily Hall, Carole N, DO       . mupirocin cream (BACTROBAN) 2 %   Topical Daily Kayleen Memos, DO   Given at 09/26/20 1215  . ondansetron (ZOFRAN) tablet 4 mg  4 mg Oral Q6H PRN Etta Quill, DO       Or  . ondansetron [facility]) injection 4 mg  4 mg Intravenous Q6H PRN Etta Quill, DO      . saccharomyces boulardii (FLORASTOR) capsule 250 mg  250 mg Oral BID Irene Pap N, DO   250 mg at 09/26/20 1221  . simvastatin (ZOCOR) tablet 20 mg  20 mg Oral Q M,W,F Etta Quill, DO   20 mg at 09/26/20 1046  . umeclidinium bromide (INCRUSE ELLIPTA) 62.5 MCG/INH 1 puff  1 puff Inhalation Daily Etta Quill, DO   1 puff at 09/26/20 0824     Discharge Medications: Please see discharge summary for a list of discharge medications.  Relevant Imaging Results:  Relevant Lab Results:   Additional Information SS# [id number]  Hebe Merriwether, Marjie Skiff, RN

## 2020-09-26 NOTE — Progress Notes (Signed)
Transition of Care (TOC) -30 day Note       Patient Details  Name: Dana [name] MRN: [id number] Date of Birth:   MUST ID: [id number]   To Whom it May Concern:   Please be advised that the above patient will require a short-term nursing home stay, anticipated 30 days or less rehabilitation and strengthening. The plan is for return home.

## 2020-09-26 NOTE — TOC Initial Note (Signed)
Transition of Care [facility]) - Initial/Assessment Note    Patient Details  Name: Dana [name] MRN: [id number] Date of Birth: 10-16-1935  Transition of [facility]) CM/SW Contact:    Lynnell Catalan, RN Phone Number: 09/26/2020, 3:41 PM  Clinical Narrative:                 This CM spoke with pt at bedside. Pt agrees with physical therapy evaluation for SNF. FL2 faxed out to [facility] per pt request. Passr received [phone]. Auth was started with [facility].   This CM received phone call from daughter [name] and bed offers were given to [place] at this time. Medicare.gov was recommended for choosing a SNF bed. TOC will continue to follow.  Expected Discharge Plan: Skilled Nursing Facility Barriers to Discharge: Continued Medical Work up   Patient Goals and CMS Choice Patient states their goals for this hospitalization and ongoing recovery are:: To get better CMS Medicare.gov Compare Post Acute Care list provided to:: Patient Choice offered to / list presented to : Adult Children  Expected Discharge Plan and Services Expected Discharge Plan: [place]   Discharge Planning Services: CM Consult Post Acute Care Choice: [place] Living arrangements for the past 2 months: Single Family Home                                      Prior Living Arrangements/Services Living arrangements for the past 2 months: Single Family Home   Patient language and need for interpreter reviewed:: Yes        Need for Family Participation in Patient Care: Yes (Comment) Care giver support system in place?: Yes (comment)   Criminal Activity/Legal Involvement Pertinent to Current Situation/Hospitalization: No - Comment as needed  Activities of Daily Living Home Assistive Devices/Equipment: None ADL Screening (condition at time of admission) Patient's cognitive ability adequate to safely complete daily activities?: Yes Is the patient deaf or have difficulty  hearing?: No Does the patient have difficulty seeing, even when wearing glasses/contacts?: No Does the patient have difficulty concentrating, remembering, or making decisions?: No Patient able to express need for assistance with ADLs?: Yes Does the patient have difficulty dressing or bathing?: No Independently performs ADLs?: Yes (appropriate for developmental age) Does the patient have difficulty walking or climbing stairs?: No Weakness of Legs: None Weakness of Arms/Hands: None  Permission Sought/Granted                  Emotional Assessment Appearance:: Appears stated age     Orientation: : Oriented to Self, Oriented to Place, Oriented to  Time, Oriented to Situation      Admission diagnosis:  Choledocholithiasis [K80.50] AKI (acute kidney injury) ([place]) [N17.9] Diarrhea, unspecified type [R19.7] Patient Active Problem List   Diagnosis Date Noted  . AKI (acute kidney injury) ([place]) 09/22/2020  . Diarrhea 09/22/2020  . Hypotension 09/22/2020  . C. difficile diarrhea 09/22/2020  . Choledocholithiasis 09/21/2020  . COPD GOLD 0/ lama/laba responsive 07/11/2019  . GERD clinical dx only  07/11/2019  . HTN (hypertension) 10/10/2017  . Imbalance 06/04/2017  . Depression 04/04/2017  . Left ankle injury, initial encounter 02/28/2017  . Encounter for therapeutic drug monitoring 10/01/2016  . DOE (dyspnea on exertion) 07/05/2016  . Pleural effusion, right 07/13/2014  . Right arm cellulitis 02/03/2014  . Cancer of right lung parenchyma ([place]) 11/12/2011   PCP:  [name], MD Pharmacy:  [facility] [place], [place] [address] [place] [address] Phone: [phone] Fax: [phone]  [place], [place] [address] [place] [address] Phone: [phone] Fax: [phone]  [place], [place] [address] Phone: [phone] Fax: [phone]     Social Determinants of Health (SDOH) Interventions    Readmission Risk Interventions No flowsheet data found.

## 2020-09-26 NOTE — Progress Notes (Signed)
PROGRESS NOTE  Dana [name] TIW:[id number] DOB: [birth year] DOA: 09/21/2020 PCP: [name], MD  HPI/Recap of past 24 hours: Dana [name] is a 85 y.o. female with medical history significant for COPD, NSCLC in remission since 2010 on Tarceva.  Presented to [facility] ED due to intractable diarrhea for the past 4 days, not responding to Imodium.  Got COVID booster 09/18/20.  Shortly after that had onset of diarrhea.  H/o c.diff in 2012.  Has had intermittent diarrhea for the past 10 years on tarceva but usually this gets better with imodium.  No recent ABx, no recent travel, no blood in stool.  ED Course: Mild LFT elevations, Creat 2.3 up from 1.0 on 09/18/20.  WBC nl.  BP soft, received 2L LR bolus.  Lactic acid 2.4 and 2.2.  AST 201, ALT 88 up from 25 and 21 on 09/18/20.  CT abd pelvis without contrast done on 09/21/2020 showed: 1. Cholelithiasis with interval development of a hydropic gallbladder and prominent common bile duct that does not taper to normal a caliber distally. Findings concerning for choledocholithiasis. Consider right upper quadrant ultrasound or MRCP for further evaluation. 2. Findings consistent with a rapid bowel transit time. No definite colitis.  Stool C.diff PCR is positive on 09/21/2020, GI panel by PCR negative.  09/26/20: Seen and examined at the bedside.  Reports gas discomfort this morning.  Simethicone ordered.  Discussed probiotic with GI Dr. Benson Norway, okay to start Florastor twice daily.   Assessment/Plan: Principal Problem:   AKI (acute kidney injury) ([place]) Active Problems:   Cancer of right lung parenchyma (H[facility])   COPD GOLD 0/ lama/laba responsive   Choledocholithiasis   Diarrhea   Hypotension   C. difficile diarrhea  Persistent intractable diarrhea 2/2 to C-diff colitis, POA Presented with diarrhea of 4 days duration not improved with home Imodium Hx of C-diff in 2012 C-diff PCR positive on 09/21/20 Started on po vancomycin with taper, completed 2  days Switched to Dificid on 09/23/2020, 200 mg twice daily, day#3/10 days. Continue IV fluids to avoid dehydration Encourage oral intake Start Florastor to 50 mg twice daily Start simethicone 4 times daily x2 days for gas discomfort.  Non-small cell lung cancer in remission since 2010 Continue to hold off Tarceva as recommended by oncology in the next 7 days from 09/23/2020 Will need to follow-up with oncology posthospitalization.  Distended gallbladder with sludge and gallstones Right upper quadrant ultrasound done on 09/22/2020 revealed: 1. Distended gallbladder with sludge and small gallstones. No sonographic findings of acute cholecystitis. 2. Intrahepatic and extrahepatic biliary dilatation. No visualized Choledocholithiasis. GI consulted, management per GI. LFTs are downtrending  Elevated liver chemistries, improving LFTs are downtrending. Avoid hepatotoxic agents and continue to monitor GI assisting with the management  Resolved post repletion: Hypokalemia, secondary to GI losses from diarrhea Presented with serum potassium of 2.4 Serum potassium this morning 3.6  Resolved post repletion: Hypomagnesemia secondary to GI losses from diarrhea Presented with serum magnesium of 1.2 Magnesium 2.8 this morning.  Physical debility PT assessment recommended SNF Fall precautions TOC consulted to assist with SNF placement. She is receptive to going to rehab first prior to returning home.  Moderate protein calorie malnutrition BMI 16 Albumin 2.4 Start oral supplement Encourage increase in protein calorie intake.    Code Status: Full code  Family Communication: None at bedside  Disposition Plan: From home, PT recommending SNF.   Consultants:  GI  Procedures:  None  Antimicrobials:  P.o. vancomycin started on 09/22/2020.  Started  on 09/21/2020 and discontinued on 09/22/2020.  DVT prophylaxis: Subcu Lovenox daily  Status is: Inpatient   Dispo:  Patient  From: Home  Planned Disposition: [name]  Expected discharge date: 09/28/20  Medically stable for discharge: No, ongoing management of C. difficile diarrhea, on IV fluids to avoid dehydration.         Objective: Vitals:   09/25/20 2010 09/26/20 0513 09/26/20 0824 09/26/20 1432  BP: (!) 150/83 (!) 147/87  119/74  Pulse: (!) 109 (!) 104  (!) 107  Resp: 20 20  15   Temp: 98.1 F (36.7 C) 98.3 F (36.8 C)  97.9 F (36.6 C)  TempSrc: Oral Oral  Oral  SpO2: 92% 93% 95% 92%  Weight:      Height:        Intake/Output Summary (Last 24 hours) at 09/26/2020 1526 Last data filed at 09/26/2020 1100 Gross per 24 hour  Intake 1075.83 ml  Output 300 ml  Net 775.83 ml   Filed Weights   09/21/20 2351  Weight: 47.6 kg    Exam:  . General: 85 y.o. year-old female pleasant well-developed well-nourished in no acute distress.  Alert and oriented x3.   . Cardiovascular: Regular rate and rhythm no rubs or gallops. Mar[place] Kitchen Respiratory: Clear to auscultation no wheezes or rales. . Abdomen: Soft Nontender Hyperactive Bowel Sounds Present.   . Musculoskeletal: No lower extremity edema bilaterally.   Marland Kitchen Psychiatry: Mood is appropriate for condition and setting.  Data Reviewed: CBC: Recent Labs  Lab 09/21/20 1132 09/22/20 0515 09/23/20 0609 09/24/20 0650  WBC 7.4 6.0 6.8 7.4  NEUTROABS 5.8  --  5.0 5.4  HGB 14.0 11.4* 11.1* 11.0*  HCT 42.2 33.7* 32.4* 33.2*  MCV 94.6 96.3 94.5 97.1  PLT 286 204 186 037   Basic Metabolic Panel: Recent Labs  Lab 09/22/20 0515 09/23/20 0609 09/24/20 0650 09/25/20 0635 09/26/20 0632  NA 141 141 141 143 141  K 2.4* 3.6 4.0 3.7 3.8  CL 109 112* 117* 116* 113*  CO2 20* 18* 18* 21* 20*  GLUCOSE 106* 84 89 93 95  BUN 32* 24* 17 12 10   CREATININE 1.45* 0.95 0.77 0.69 0.70  CALCIUM 7.1* 6.8* 6.7* 6.7* 6.8*  MG 1.2* 2.8*  --   --   --    GFR: Estimated Creatinine Clearance: 40 mL/min (by C-G formula based on SCr of 0.7 mg/dL). Liver  Function Tests: Recent Labs  Lab 09/22/20 0515 09/23/20 0609 09/24/20 0650 09/25/20 0635 09/26/20 0632  AST 121* 75* 53* 40 38  ALT 71* 65* 61* 57* 56*  ALKPHOS 28* 32* 35* 32* 32*  BILITOT 0.9 0.7 0.7 0.7 0.7  PROT 5.1* 4.9* 4.9* 4.6* 4.8*  ALBUMIN 2.6* 2.3* 2.4* 2.4* 2.4*   No results for input(s): LIPASE, AMYLASE in the last 168 hours. No results for input(s): AMMONIA in the last 168 hours. Coagulation Profile: No results for input(s): INR, PROTIME in the last 168 hours. Cardiac Enzymes: No results for input(s): CKTOTAL, CKMB, CKMBINDEX, TROPONINI in the last 168 hours. BNP (last 3 results) No results for input(s): PROBNP in the last 8760 hours. HbA1C: No results for input(s): HGBA1C in the last 72 hours. CBG: No results for input(s): GLUCAP in the last 168 hours. Lipid Profile: No results for input(s): CHOL, HDL, LDLCALC, TRIG, CHOLHDL, LDLDIRECT in the last 72 hours. Thyroid Function Tests: No results for input(s): TSH, T4TOTAL, FREET4, T3FREE, THYROIDAB in the last 72 hours. Anemia Panel: No results for input(s): VITAMINB12, FOLATE,  FERRITIN, TIBC, IRON, RETICCTPCT in the last 72 hours. Urine analysis:    Component Value Date/Time   COLORURINE YELLOW 05/28/2019 [place] 05/28/2019 2225   LABSPEC 1.010 05/28/2019 2225   PHURINE 6.5 05/28/2019 2225   GLUCOSEU NEGATIVE 05/28/2019 2225   HGBUR NEGATIVE 05/28/2019 2225   BILIRUBINUR NEGATIVE 05/28/2019 2225   KETONESUR NEGATIVE 05/28/2019 2225   PROTEINUR NEGATIVE 05/28/2019 2225   UROBILINOGEN 1.0 01/06/2013 1029   NITRITE NEGATIVE 05/28/2019 2225   LEUKOCYTESUR SMALL (A) 05/28/2019 2225   Sepsis Labs: @LABRCNTIP (procalcitonin:4,lacticidven:4)  ) Recent Results (from the past 240 hour(s))  Respiratory Panel by RT PCR (Flu A&B, Covid) - Nasopharyngeal Swab     Status: None   Collection Time: 09/21/20 11:32 AM   Specimen: Nasopharyngeal Swab  Result Value Ref Range Status   SARS Coronavirus 2  by RT PCR NEGATIVE NEGATIVE Final    Comment: (NOTE) SARS-CoV-2 target nucleic acids are NOT DETECTED.  The SARS-CoV-2 RNA is generally detectable in upper respiratoy specimens during the acute phase of infection. The lowest concentration of SARS-CoV-2 viral copies this assay can detect is 131 copies/mL. A negative result does not preclude SARS-Cov-2 infection and should not be used as the sole basis for treatment or other patient management decisions. A negative result may occur with  improper specimen collection/handling, submission of specimen other than nasopharyngeal swab, presence of viral mutation(s) within the areas targeted by this assay, and inadequate number of viral copies (<131 copies/mL). A negative result must be combined with clinical observations, patient history, and epidemiological information. The expected result is Negative.  Fact Sheet for Patients:  PinkCheek.be  Fact Sheet for Healthcare Providers:  GravelBags.it  This test is no t yet approved or cleared by the [place] FDA and  has been authorized for detection and/or diagnosis of SARS-CoV-2 by FDA under an Emergency Use Authorization (EUA). This EUA will remain  in effect (meaning this test can be used) for the duration of the COVID-19 declaration under Section 564(b)(1) of the Act, 21 U.S.C. section 360bbb-3(b)(1), unless the authorization is terminated or revoked sooner.     Influenza A by PCR NEGATIVE NEGATIVE Final   Influenza B by PCR NEGATIVE NEGATIVE Final    Comment: (NOTE) The Xpert Xpress SARS-CoV-2/FLU/RSV assay is intended as an aid in  the diagnosis of influenza from Nasopharyngeal swab specimens and  should not be used as a sole basis for treatment. Nasal washings and  aspirates are unacceptable for Xpert Xpress SARS-CoV-2/FLU/RSV  testing.  Fact Sheet for Patients: PinkCheek.be  Fact Sheet  for Healthcare Providers: GravelBags.it  This test is not yet approved or cleared by the [place] FDA and  has been authorized for detection and/or diagnosis of SARS-CoV-2 by  FDA under an Emergency Use Authorization (EUA). This EUA will remain  in effect (meaning this test can be used) for the duration of the  Covid-19 declaration under Section 564(b)(1) of the Act, 21  U.S.C. section 360bbb-3(b)(1), unless the authorization is  terminated or revoked. Performed at [facility] [place], [address] [place], [place] [address]   C Difficile Quick Screen w PCR reflex     Status: Abnormal   Collection Time: 09/21/20  3:35 PM   Specimen: STOOL  Result Value Ref Range Status   C Diff antigen POSITIVE (A) NEGATIVE Final   C Diff toxin POSITIVE (A) NEGATIVE Final   C Diff interpretation Toxin producing C. difficile detected.  Final    Comment:  CRITICAL RESULT CALLED TO, READ BACK BY AND VERIFIED WITH: Rosemarie Ax RN [id number] 09/21/20 A BROWNING Performed at [facility], [place] [address] [place], [place] [address]   Gastrointestinal Panel by PCR , Stool     Status: None   Collection Time: 09/21/20  3:35 PM   Specimen: Stool  Result Value Ref Range Status   Campylobacter species NOT DETECTED NOT DETECTED Final   Plesimonas shigelloides NOT DETECTED NOT DETECTED Final   Salmonella species NOT DETECTED NOT DETECTED Final   Yersinia enterocolitica NOT DETECTED NOT DETECTED Final   Vibrio species NOT DETECTED NOT DETECTED Final   Vibrio cholerae NOT DETECTED NOT DETECTED Final   Enteroaggregative E coli (EAEC) NOT DETECTED NOT DETECTED Final   Enteropathogenic E coli (EPEC) NOT DETECTED NOT DETECTED Final   Enterotoxigenic E coli (ETEC) NOT DETECTED NOT DETECTED Final   Shiga like toxin producing E coli (STEC) NOT DETECTED NOT DETECTED Final   Shigella/Enteroinvasive E coli (EIEC) NOT DETECTED NOT DETECTED Final   Cryptosporidium NOT DETECTED NOT DETECTED  Final   Cyclospora cayetanensis NOT DETECTED NOT DETECTED Final   Entamoeba histolytica NOT DETECTED NOT DETECTED Final   Giardia lamblia NOT DETECTED NOT DETECTED Final   Adenovirus F40/41 NOT DETECTED NOT DETECTED Final   Astrovirus NOT DETECTED NOT DETECTED Final   Norovirus GI/GII NOT DETECTED NOT DETECTED Final   Rotavirus A NOT DETECTED NOT DETECTED Final   Sapovirus (I, II, IV, and V) NOT DETECTED NOT DETECTED Final    Comment: Performed at [facility], [address] [place],  [address]      Studies: No results found.  Scheduled Meds: . amLODipine  5 mg Oral Daily  . arformoterol  15 mcg Nebulization BID  . enoxaparin (LOVENOX) injection  30 mg Subcutaneous Q24H  . feeding supplement  1 Container Oral TID BM  . fidaxomicin  200 mg Oral BID  . meloxicam  15 mg Oral Daily  . mirtazapine  15 mg Oral QHS  . multivitamin with minerals  1 tablet Oral Daily  . mupirocin cream   Topical Daily  . saccharomyces boulardii  250 mg Oral BID  . simvastatin  20 mg Oral Q M,W,F  . umeclidinium bromide  1 puff Inhalation Daily    Continuous Infusions: . sodium chloride 50 mL/hr at 09/26/20 0332     LOS: 5 days     Kayleen Memos, MD [facility] Pager [phone]  If 7PM-7AM, please contact night-coverage [email or web address] Password [email or web address] 09/26/2020, 3:26 PM

## 2020-09-26 NOTE — Care Management Important Message (Signed)
Important Message  Patient Details IM Letter given to the Patient Name: Dana [name] MRN: [id number] Date of Birth: 1935/03/31   Medicare Important Message Given:  Yes     Kerin Salen 09/26/2020, 11:42 AM

## 2020-09-26 NOTE — Progress Notes (Signed)
Subjective: She complains about some gas pains.  Objective: Vital signs in last 24 hours: Temp:  [97.2 F (36.2 C)-98.3 F (36.8 C)] 98.3 F (36.8 C) (10/04 0513) Pulse Rate:  [98-109] 104 (10/04 0513) Resp:  [19-20] 20 (10/04 0513) BP: (109-150)/(69-87) 147/87 (10/04 0513) SpO2:  [92 %-97 %] 95 % (10/04 0824) Last BM Date: 09/25/20  Intake/Output from previous day: 10/03 0701 - 10/04 0700 In: 450 [I.V.:450] Out: 300 [Urine:300] Intake/Output this shift: No intake/output data recorded.  General appearance: alert and no distress Resp: clear to auscultation bilaterally Cardio: regular rate and rhythm GI: soft, non-tender; bowel sounds normal; no masses,  no organomegaly Extremities: extremities normal, atraumatic, no cyanosis or edema  Lab Results: Recent Labs    09/24/20 0650  WBC 7.4  HGB 11.0*  HCT 33.2*  PLT 239   BMET Recent Labs    09/24/20 0650 09/25/20 0635 09/26/20 0632  NA 141 143 141  K 4.0 3.7 3.8  CL 117* 116* 113*  CO2 18* 21* 20*  GLUCOSE 89 93 95  BUN 17 12 10   CREATININE 0.77 0.69 0.70  CALCIUM 6.7* 6.7* 6.8*   LFT Recent Labs    09/26/20 0632  PROT 4.8*  ALBUMIN 2.4*  AST 38  ALT 56*  ALKPHOS 32*  BILITOT 0.7   PT/INR No results for input(s): LABPROT, INR in the last 72 hours. Hepatitis Panel No results for input(s): HEPBSAG, HCVAB, HEPAIGM, HEPBIGM in the last 72 hours. C-Diff No results for input(s): CDIFFTOX in the last 72 hours. Fecal Lactopherrin No results for input(s): FECLLACTOFRN in the last 72 hours.  Studies/Results: No results found.  Medications:  Scheduled: . amLODipine  5 mg Oral Daily  . arformoterol  15 mcg Nebulization BID  . enoxaparin (LOVENOX) injection  30 mg Subcutaneous Q24H  . feeding supplement (ENSURE ENLIVE)  237 mL Oral BID BM  . fidaxomicin  200 mg Oral BID  . meloxicam  15 mg Oral Daily  . mirtazapine  15 mg Oral QHS  . mupirocin cream   Topical Daily  . potassium chloride  40 mEq Oral  BID  . simvastatin  20 mg Oral Q M,W,F  . umeclidinium bromide  1 puff Inhalation Daily   Continuous: . sodium chloride 50 mL/hr at 09/26/20 0332    Assessment/Plan: 1) C diff colitis. 2) Diarrhea.   She is improving clinically.  Her diarrhea is still present, but the volume declined and there seems to be more form to the stool.    Plan: 1) Continue with Dificid.  LOS: 5 days   Satvik Parco D 09/26/2020, 8:25 AM

## 2020-09-26 NOTE — Progress Notes (Signed)
Initial Nutrition Assessment  DOCUMENTATION CODES:   Severe malnutrition in context of chronic illness, Underweight  INTERVENTION:   -D/c Ensure -Boost Breeze po TID, each supplement provides 250 kcal and 9 grams of protein -Multivitamin with minerals daily -Encouraged small, frequent meals -Placed "High Calorie, High Protein" nutrition therapy handout in discharge instructions  NUTRITION DIAGNOSIS:   Severe Malnutrition related to chronic illness, cancer and cancer related treatments (COPD) as evidenced by percent weight loss, energy intake < or equal to 75% for > or equal to 1 month, severe fat depletion, severe muscle depletion.  GOAL:   Patient will meet greater than or equal to 90% of their needs  MONITOR:   PO intake, Supplement acceptance, Labs, Weight trends, I & O's  REASON FOR ASSESSMENT:   Consult Assessment of nutrition requirement/status  ASSESSMENT:   85 y.o. female with medical history significant of COPD, NSCLC in remission since 2010, remains on Tarceva since that time. Pt presents to ED for diarrhea that has been constant and ongoing for past 4 days.  Got COVID booster 9/26.  Shortly after that had onset of diarrhea. Stool C.diff PCR is positive on 09/21/2020.  Patient reports extremely poor appetite and early satiety. Pt states she did have an english muffin and some coffee this morning. Still reports feeling full from this. Pt would drink Ensure but states it feels her up too much. RD encouraged protein supplements stating that her meal does not provide enough kcals and protein versus a whole Ensure (350 kcals, 20g protein). Pt is willing to try Boost Breeze supplements as they may not fill her up as much. RD to order. Discussed small, frequent meals with pt and encouraged her to request crackers and peanut butter from unit if she has gone more than 2-3 hours without a meal.   Pt reports her BM are improving, less frequent.   Per weight records, pt has lost  7 lbs since 8/10 (6% wt loss x 2 months, significant for time frame).   Labs reviewed. Medications: Remeron, Florastor  NUTRITION - FOCUSED PHYSICAL EXAM:    Most Recent Value  Orbital Region Moderate depletion  Upper Arm Region Severe depletion  Thoracic and Lumbar Region Unable to assess  Buccal Region Moderate depletion  Temple Region Severe depletion  Clavicle Bone Region Severe depletion  Clavicle and Acromion Bone Region Severe depletion  Scapular Bone Region Severe depletion  Dorsal Hand Severe depletion  Patellar Region Unable to assess  Anterior Thigh Region Unable to assess  Posterior Calf Region Unable to assess  Edema (RD Assessment) None  Hair Reviewed  Eyes Reviewed  Mouth Reviewed  Skin Reviewed       Diet Order:   Diet Order            Diet regular Room service appropriate? Yes; Fluid consistency: Thin  Diet effective now                 EDUCATION NEEDS:   Education needs have been addressed  Skin:  Skin Assessment: Reviewed RN Assessment  Last BM:  10/4 -type 7  Height:   Ht Readings from Last 1 Encounters:  09/21/20 5\' 6"  (1.676 m)    Weight:   Wt Readings from Last 1 Encounters:  09/21/20 47.6 kg    BMI:  Body mass index is 16.94 kg/m.  Estimated Nutritional Needs:   Kcal:  1450-1650  Protein:  70-85g  Fluid:  1.6L/day    Clayton Bibles, MS, RD, LDN Inpatient Clinical Dietitian  Contact information available via Amion

## 2020-09-26 NOTE — Progress Notes (Signed)
Physical Therapy Treatment Patient Details Name: Dana [name] MRN: [id number] DOB: [birth year] Today's Date: 09/26/2020    History of Present Illness 85 y.o. female with medical history significant for COPD, NSCLC in remission since 2010 on Tarceva, osteoporosis, HTN, depression.  Pt arrives to ED on 9/29 with c-diff, cholelithiasis.    PT Comments    Pt polite and motivated throughout session, tolerates therapeutic exercises with rest breaks due to fatigue. Pt with "soreness" in bil quads with L>R but tolerates exercises. Pt able to perform STS reps from EOB, educated on pushing from seated surface but more comfortable with hands on RW when rising from sitting EOB. Pt fatigued with STS reps only able to complete 6 before needing a rest break. Pt declines ambulation due to fatigue from exercises. Pt transfers back to supine to take a nap. RN notified of session. Patient will benefit from continued physical therapy in hospital and recommendations below to increase strength, balance, endurance for safe ADLs and gait.   Follow Up Recommendations  SNF;Supervision/Assistance - 24 hour     Equipment Recommendations  None recommended by PT    Recommendations for Other Services       Precautions / Restrictions Precautions Precautions: Fall Restrictions Weight Bearing Restrictions: No    Mobility  Bed Mobility Overal bed mobility: Needs Assistance Bed Mobility: Supine to Sit;Sit to Supine  Supine to sit: Supervision Sit to supine: Supervision   General bed mobility comments: slow, labored movement requiring increased time, minimal use of bedrail to upright trunk  Transfers Overall transfer level: Needs assistance Equipment used: Rolling walker (2 wheeled) Transfers: Sit to/from Stand    General transfer comment: RW for pt comfort, cues for BUE pushing off bed but more comfortable with pulling on RW to stand  Ambulation/Gait  General Gait Details: pt declined "too tired" after  exercises   Stairs             Wheelchair Mobility    Modified Rankin (Stroke Patients Only)       Balance Overall balance assessment: Needs assistance;History of Falls Sitting-balance support: No upper extremity supported;Feet supported Sitting balance-Leahy Scale: Fair Sitting balance - Comments: seated EOB   Standing balance support: During functional activity;Bilateral upper extremity supported Standing balance-Leahy Scale: Poor Standing balance comment: UE support       Cognition Arousal/Alertness: Awake/alert Behavior During Therapy: WFL for tasks assessed/performed Overall Cognitive Status: Within Functional Limits for tasks assessed         Exercises General Exercises - Lower Extremity Ankle Circles/Pumps: Supine;AROM;Strengthening;Both;20 reps Short Arc Quad: Supine;AROM;Strengthening;Both;15 reps Hip ABduction/ADduction: Supine;AROM;Strengthening;Both;10 reps Straight Leg Raises: Supine;AROM;Strengthening;Both;10 reps Other Exercises Other Exercises: STS, 6 reps, BUE assisting    General Comments        Pertinent Vitals/Pain Pain Assessment: Faces Faces Pain Scale: Hurts little more Pain Location: BLE with exercise Pain Descriptors / Indicators: Sore Pain Intervention(s): Limited activity within patient's tolerance;Monitored during session    Home Living                      Prior Function            PT Goals (current goals can now be found in the care plan section) Acute Rehab PT Goals Patient Stated Goal: feel better PT Goal Formulation: With patient Time For Goal Achievement: 10/08/20 Potential to Achieve Goals: Good Progress towards PT goals: Progressing toward goals    Frequency    Min 2X/week      PT Plan  Current plan remains appropriate    Co-evaluation              AM-PAC PT "6 Clicks" Mobility   Outcome Measure  Help needed turning from your back to your side while in a flat bed without using  bedrails?: A Little Help needed moving from lying on your back to sitting on the side of a flat bed without using bedrails?: A Little Help needed moving to and from a bed to a chair (including a wheelchair)?: A Little Help needed standing up from a chair using your arms (e.g., wheelchair or bedside chair)?: A Little Help needed to walk in hospital room?: A Little Help needed climbing 3-5 steps with a railing? : A Lot 6 Click Score: 17    End of Session   Activity Tolerance: Patient tolerated treatment well;Patient limited by fatigue Patient left: in bed;with call bell/phone within reach Nurse Communication: Mobility status PT Visit Diagnosis: Difficulty in walking, not elsewhere classified (R26.2);Unsteadiness on feet (R26.81)     Time: 0110-0349 PT Time Calculation (min) (ACUTE ONLY): 21 min  Charges:  $Therapeutic Exercise: 8-22 mins                      Tori Ethin Drummond PT, DPT 09/26/20, 1:53 PM

## 2020-09-27 LAB — RESPIRATORY PANEL BY RT PCR (FLU A&B, COVID)
Influenza A by PCR: NEGATIVE
Influenza B by PCR: NEGATIVE
SARS Coronavirus 2 by RT PCR: NEGATIVE

## 2020-09-27 NOTE — Plan of Care (Signed)

## 2020-09-27 NOTE — Progress Notes (Signed)
Patient requesting to have telemetry leads off, order verbally dc by Dr. Nevada Crane. Patient is stable and no c/o pain or discomfort. Will continue to monitor closely.

## 2020-09-27 NOTE — Progress Notes (Signed)
PROGRESS NOTE  Dana [name] NFA:[id number] DOB: Dec 18, 1935 DOA: 09/21/2020 PCP: [name], MD  HPI/Recap of past 24 hours: Dana [name] is a 85 y.o. female with medical history significant for COPD, NSCLC in remission since 2010 on Tarceva.  Presented to [facility] ED due to intractable diarrhea for the past 4 days, not responding to Imodium.  Got COVID booster 09/18/20.  Shortly after that had onset of diarrhea.  H/o c.diff in 2012.  Has had intermittent diarrhea for the past 10 years on tarceva but usually this gets better with imodium.  No recent ABx, no recent travel, no blood in stool.  ED Course: Mild LFT elevations, Creat 2.3 up from 1.0 on 09/18/20.  WBC nl.  BP soft, received 2L LR bolus.  Lactic acid 2.4 and 2.2.  AST 201, ALT 88 up from 25 and 21 on 09/18/20.  CT abd pelvis without contrast done on 09/21/2020 showed: 1. Cholelithiasis with interval development of a hydropic gallbladder and prominent common bile duct that does not taper to normal a caliber distally. Findings concerning for choledocholithiasis. Consider right upper quadrant ultrasound or MRCP for further evaluation. 2. Findings consistent with a rapid bowel transit time. No definite colitis.  Stool C.diff PCR is positive on 09/21/2020, GI panel by PCR negative.  Added probiotics, discussed probiotic with GI Dr. Benson Norway, okay to start Florastor twice daily on 09/26/20.  09/27/20: Seen and examined at the bedside.  Reports 3 bowel movements this morning.  Less loose and less frequent.  Per TOC she has a SNF bed tomorrow 09/28/2020.  Assessment/Plan: Principal Problem:   AKI (acute kidney injury) ([place]) Active Problems:   Cancer of right lung parenchyma (HCC)   COPD GOLD 0/ lama/laba responsive   Choledocholithiasis   Diarrhea   Hypotension   C. difficile diarrhea  Persistent intractable diarrhea 2/2 to C-diff colitis, POA Presented with diarrhea of 4 days duration not improved with home Imodium Hx of C-diff  in 2012 C-diff PCR positive on 09/21/20 Started on po vancomycin with taper, completed 2 days Switched to Dificid on 09/23/2020, 200 mg twice daily, day#5/10 days. Added Florastor 250 milligrams twice daily on 09/26/2020 Continue IV fluids to avoid dehydration Continue to encourage oral intake Simethicone 4 times daily x2 days for gas discomfort.  Non-small cell lung cancer in remission since 2010 Continue to hold off Tarceva as recommended by oncology in the next 7 days from 09/23/2020 Will need to follow-up with oncology posthospitalization.  Distended gallbladder with sludge and gallstones Right upper quadrant ultrasound done on 09/22/2020 revealed: 1. Distended gallbladder with sludge and small gallstones. No sonographic findings of acute cholecystitis. 2. Intrahepatic and extrahepatic biliary dilatation. No visualized Choledocholithiasis. GI consulted, management per GI. LFTs are downtrending  Elevated liver chemistries, improving LFTs are downtrending. Avoid hepatotoxic agents and continue to monitor GI assisting with the management  Resolved post repletion: Hypokalemia, secondary to GI losses from diarrhea Presented with serum potassium of 2.4 Serum potassium this morning 3.6  Resolved post repletion: Hypomagnesemia secondary to GI losses from diarrhea Presented with serum magnesium of 1.2 Magnesium 2.8 this morning.  Physical debility PT assessment recommended SNF Fall precautions TOC consulted to assist with SNF placement. She is receptive to going to rehab first prior to returning home.  Moderate protein calorie malnutrition BMI 16 Albumin 2.4 She states oral supplement gives her diarrhea  Encourage increase in protein calorie intake.    Code Status: Full code  Family Communication: None at bedside  Disposition Plan:  SNF likely tomorrow 09/28/2020, she has a bed available.  Consultants:  GI  Procedures:  None  Antimicrobials:  P.o. vancomycin  started on 09/22/2020.  Started on 09/21/2020 and discontinued on 09/22/2020.  Dificid started on 09/23/2020 x 10 days  DVT prophylaxis: Subcu Lovenox daily  Status is: Inpatient   Dispo:  Patient From: Home  Planned Disposition: [place]  Expected discharge date: 09/28/20  Medically stable for discharge: No, ongoing management of C. difficile diarrhea, on IV fluids to avoid dehydration.         Objective: Vitals:   09/26/20 2013 09/27/20 0306 09/27/20 0901 09/27/20 1500  BP: 120/72 (!) 143/85  129/83  Pulse: (!) 101 (!) 105  (!) 108  Resp: 18 18  18   Temp: 98.7 F (37.1 C) 97.8 F (36.6 C)  98.4 F (36.9 C)  TempSrc: Oral Oral  Oral  SpO2: 94% 93% 95% 90%  Weight:      Height:        Intake/Output Summary (Last 24 hours) at 09/27/2020 1844 Last data filed at 09/27/2020 1500 Gross per 24 hour  Intake 1689.86 ml  Output 350 ml  Net 1339.86 ml   Filed Weights   09/21/20 2351  Weight: 47.6 kg    Exam:  . General: 85 y.o. year-old female pleasant in no acute distress.  Alert and oriented x3. . Cardiovascular: Regular rate and rhythm no rubs or gallops.   Marland Kitchen Respiratory: Clear to auscultation no wheezes or rales.   . Abdomen: Soft nontender hyperactive bowel sounds present. . Musculoskeletal: No lower extremity edema bilaterally.   Marland Kitchen Psychiatry: Mood is appropriate for condition and setting.   Data Reviewed: CBC: Recent Labs  Lab 09/21/20 1132 09/22/20 0515 09/23/20 0609 09/24/20 0650  WBC 7.4 6.0 6.8 7.4  NEUTROABS 5.8  --  5.0 5.4  HGB 14.0 11.4* 11.1* 11.0*  HCT 42.2 33.7* 32.4* 33.2*  MCV 94.6 96.3 94.5 97.1  PLT 286 204 186 761   Basic Metabolic Panel: Recent Labs  Lab 09/22/20 0515 09/23/20 0609 09/24/20 0650 09/25/20 0635 09/26/20 0632  NA 141 141 141 143 141  K 2.4* 3.6 4.0 3.7 3.8  CL 109 112* 117* 116* 113*  CO2 20* 18* 18* 21* 20*  GLUCOSE 106* 84 89 93 95  BUN 32* 24* 17 12 10   CREATININE 1.45* 0.95 0.77 0.69  0.70  CALCIUM 7.1* 6.8* 6.7* 6.7* 6.8*  MG 1.2* 2.8*  --   --   --    GFR: Estimated Creatinine Clearance: 40 mL/min (by C-G formula based on SCr of 0.7 mg/dL). Liver Function Tests: Recent Labs  Lab 09/22/20 0515 09/23/20 0609 09/24/20 0650 09/25/20 0635 09/26/20 0632  AST 121* 75* 53* 40 38  ALT 71* 65* 61* 57* 56*  ALKPHOS 28* 32* 35* 32* 32*  BILITOT 0.9 0.7 0.7 0.7 0.7  PROT 5.1* 4.9* 4.9* 4.6* 4.8*  ALBUMIN 2.6* 2.3* 2.4* 2.4* 2.4*   No results for input(s): LIPASE, AMYLASE in the last 168 hours. No results for input(s): AMMONIA in the last 168 hours. Coagulation Profile: No results for input(s): INR, PROTIME in the last 168 hours. Cardiac Enzymes: No results for input(s): CKTOTAL, CKMB, CKMBINDEX, TROPONINI in the last 168 hours. BNP (last 3 results) No results for input(s): PROBNP in the last 8760 hours. HbA1C: No results for input(s): HGBA1C in the last 72 hours. CBG: No results for input(s): GLUCAP in the last 168 hours. Lipid Profile: No results for input(s): CHOL, HDL, LDLCALC,  TRIG, CHOLHDL, LDLDIRECT in the last 72 hours. Thyroid Function Tests: No results for input(s): TSH, T4TOTAL, FREET4, T3FREE, THYROIDAB in the last 72 hours. Anemia Panel: No results for input(s): VITAMINB12, FOLATE, FERRITIN, TIBC, IRON, RETICCTPCT in the last 72 hours. Urine analysis:    Component Value Date/Time   COLORURINE YELLOW 05/28/2019 [place] 05/28/2019 2225   LABSPEC 1.010 05/28/2019 2225   PHURINE 6.5 05/28/2019 2225   GLUCOSEU NEGATIVE 05/28/2019 2225   HGBUR NEGATIVE 05/28/2019 2225   BILIRUBINUR NEGATIVE 05/28/2019 2225   KETONESUR NEGATIVE 05/28/2019 2225   PROTEINUR NEGATIVE 05/28/2019 2225   UROBILINOGEN 1.0 01/06/2013 1029   NITRITE NEGATIVE 05/28/2019 2225   LEUKOCYTESUR SMALL (A) 05/28/2019 2225   Sepsis Labs: @LABRCNTIP (procalcitonin:4,lacticidven:4)  ) Recent Results (from the past 240 hour(s))  Respiratory Panel by RT PCR (Flu  A&B, Covid) - Nasopharyngeal Swab     Status: None   Collection Time: 09/21/20 11:32 AM   Specimen: Nasopharyngeal Swab  Result Value Ref Range Status   SARS Coronavirus 2 by RT PCR NEGATIVE NEGATIVE Final    Comment: (NOTE) SARS-CoV-2 target nucleic acids are NOT DETECTED.  The SARS-CoV-2 RNA is generally detectable in upper respiratoy specimens during the acute phase of infection. The lowest concentration of SARS-CoV-2 viral copies this assay can detect is 131 copies/mL. A negative result does not preclude SARS-Cov-2 infection and should not be used as the sole basis for treatment or other patient management decisions. A negative result may occur with  improper specimen collection/handling, submission of specimen other than nasopharyngeal swab, presence of viral mutation(s) within the areas targeted by this assay, and inadequate number of viral copies (<131 copies/mL). A negative result must be combined with clinical observations, patient history, and epidemiological information. The expected result is Negative.  Fact Sheet for Patients:  PinkCheek.be  Fact Sheet for Healthcare Providers:  GravelBags.it  This test is no t yet approved or cleared by the [place] FDA and  has been authorized for detection and/or diagnosis of SARS-CoV-2 by FDA under an Emergency Use Authorization (EUA). This EUA will remain  in effect (meaning this test can be used) for the duration of the COVID-19 declaration under Section 564(b)(1) of the Act, 21 U.S.C. section 360bbb-3(b)(1), unless the authorization is terminated or revoked sooner.     Influenza A by PCR NEGATIVE NEGATIVE Final   Influenza B by PCR NEGATIVE NEGATIVE Final    Comment: (NOTE) The Xpert Xpress SARS-CoV-2/FLU/RSV assay is intended as an aid in  the diagnosis of influenza from Nasopharyngeal swab specimens and  should not be used as a sole basis for treatment. Nasal  washings and  aspirates are unacceptable for Xpert Xpress SARS-CoV-2/FLU/RSV  testing.  Fact Sheet for Patients: PinkCheek.be  Fact Sheet for Healthcare Providers: GravelBags.it  This test is not yet approved or cleared by the [place] FDA and  has been authorized for detection and/or diagnosis of SARS-CoV-2 by  FDA under an Emergency Use Authorization (EUA). This EUA will remain  in effect (meaning this test can be used) for the duration of the  Covid-19 declaration under Section 564(b)(1) of the Act, 21  U.S.C. section 360bbb-3(b)(1), unless the authorization is  terminated or revoked. Performed at [facility], [address] [place], [place] [address]   C Difficile Quick Screen w PCR reflex     Status: Abnormal   Collection Time: 09/21/20  3:35 PM   Specimen: STOOL  Result Value Ref Range Status  C Diff antigen POSITIVE (A) NEGATIVE Final   C Diff toxin POSITIVE (A) NEGATIVE Final   C Diff interpretation Toxin producing C. difficile detected.  Final    Comment: CRITICAL RESULT CALLED TO, READ BACK BY AND VERIFIED WITH: Rosemarie Ax RN 0940 09/21/20 A BROWNING Performed at [facility], [place] [address], [place], [place] [address]   Gastrointestinal Panel by PCR , Stool     Status: None   Collection Time: 09/21/20  3:35 PM   Specimen: Stool  Result Value Ref Range Status   Campylobacter species NOT DETECTED NOT DETECTED Final   Plesimonas shigelloides NOT DETECTED NOT DETECTED Final   Salmonella species NOT DETECTED NOT DETECTED Final   Yersinia enterocolitica NOT DETECTED NOT DETECTED Final   Vibrio species NOT DETECTED NOT DETECTED Final   Vibrio cholerae NOT DETECTED NOT DETECTED Final   Enteroaggregative E coli (EAEC) NOT DETECTED NOT DETECTED Final   Enteropathogenic E coli (EPEC) NOT DETECTED NOT DETECTED Final   Enterotoxigenic E coli (ETEC) NOT DETECTED NOT DETECTED Final   Shiga like toxin  producing E coli (STEC) NOT DETECTED NOT DETECTED Final   Shigella/Enteroinvasive E coli (EIEC) NOT DETECTED NOT DETECTED Final   Cryptosporidium NOT DETECTED NOT DETECTED Final   Cyclospora cayetanensis NOT DETECTED NOT DETECTED Final   Entamoeba histolytica NOT DETECTED NOT DETECTED Final   Giardia lamblia NOT DETECTED NOT DETECTED Final   Adenovirus F40/41 NOT DETECTED NOT DETECTED Final   Astrovirus NOT DETECTED NOT DETECTED Final   Norovirus GI/GII NOT DETECTED NOT DETECTED Final   Rotavirus A NOT DETECTED NOT DETECTED Final   Sapovirus (I, II, IV, and V) NOT DETECTED NOT DETECTED Final    Comment: Performed at [facility], [address], [place], [place] [address]      Studies: No results found.  Scheduled Meds: . amLODipine  5 mg Oral Daily  . arformoterol  15 mcg Nebulization BID  . enoxaparin (LOVENOX) injection  30 mg Subcutaneous Q24H  . feeding supplement  1 Container Oral TID BM  . fidaxomicin  200 mg Oral BID  . meloxicam  15 mg Oral Daily  . mirtazapine  15 mg Oral QHS  . multivitamin with minerals  1 tablet Oral Daily  . mupirocin cream   Topical Daily  . saccharomyces boulardii  250 mg Oral BID  . simethicone  80 mg Oral QID  . simvastatin  20 mg Oral Q M,W,F  . umeclidinium bromide  1 puff Inhalation Daily    Continuous Infusions: . sodium chloride 50 mL/hr at 09/26/20 2242     LOS: 6 days     Kayleen Memos, MD [facility] Pager [phone]  If 7PM-7AM, please contact night-coverage [email or web address] Password [facility] 09/27/2020, 6:44 PM

## 2020-09-27 NOTE — Progress Notes (Signed)
Patient bowel movements are not as loose and less frequent. Will continue to monitor.

## 2020-09-27 NOTE — Progress Notes (Signed)
Subjective: No new complaints.  Objective: Vital signs in last 24 hours: Temp:  [97.8 F (36.6 C)-98.7 F (37.1 C)] 98.4 F (36.9 C) (10/05 1500) Pulse Rate:  [101-108] 108 (10/05 1500) Resp:  [18] 18 (10/05 1500) BP: (120-143)/(72-85) 129/83 (10/05 1500) SpO2:  [90 %-95 %] 90 % (10/05 1500) Last BM Date: 09/27/20  Intake/Output from previous day: 10/04 0701 - 10/05 0700 In: 1776.9 [P.O.:480; I.V.:1296.9] Out: 350 [Urine:350] Intake/Output this shift: Total I/O In: 907.8 [P.O.:477; I.V.:430.8] Out: -   General appearance: alert and no distress GI: soft, non-tender; bowel sounds normal; no masses,  no organomegaly  Lab Results: No results for input(s): WBC, HGB, HCT, PLT in the last 72 hours. BMET Recent Labs    09/25/20 0635 09/26/20 0632  NA 143 141  K 3.7 3.8  CL 116* 113*  CO2 21* 20*  GLUCOSE 93 95  BUN 12 10  CREATININE 0.69 0.70  CALCIUM 6.7* 6.8*   LFT Recent Labs    09/26/20 0632  PROT 4.8*  ALBUMIN 2.4*  AST 38  ALT 56*  ALKPHOS 32*  BILITOT 0.7   PT/INR No results for input(s): LABPROT, INR in the last 72 hours. Hepatitis Panel No results for input(s): HEPBSAG, HCVAB, HEPAIGM, HEPBIGM in the last 72 hours. C-Diff No results for input(s): CDIFFTOX in the last 72 hours. Fecal Lactopherrin No results for input(s): FECLLACTOFRN in the last 72 hours.  Studies/Results: No results found.  Medications:  Scheduled: . amLODipine  5 mg Oral Daily  . arformoterol  15 mcg Nebulization BID  . enoxaparin (LOVENOX) injection  30 mg Subcutaneous Q24H  . feeding supplement  1 Container Oral TID BM  . fidaxomicin  200 mg Oral BID  . meloxicam  15 mg Oral Daily  . mirtazapine  15 mg Oral QHS  . multivitamin with minerals  1 tablet Oral Daily  . mupirocin cream   Topical Daily  . saccharomyces boulardii  250 mg Oral BID  . simethicone  80 mg Oral QID  . simvastatin  20 mg Oral Q M,W,F  . umeclidinium bromide  1 puff Inhalation Daily    Continuous: . sodium chloride 50 mL/hr at 09/26/20 2242    Assessment/Plan: 1) C diff. 2) Diarrhea. 3) Dilated biliary ducts.   The patient only reports three soft bowel movements in over 24 hours.  She was informed that this was very good progress, but she was hoping for a more accelerated resolution, ie, formed stools.  From the GI standpoint she will remain on fidaxomicin x 10 days.  Resumption of  Tarceva per Dr. [name].  She can follow up in the office after her discharge for an outpatient work up of her dilated biliary ducts.  Signing off.  LOS: 6 days   Chai Verdejo D 09/27/2020, 3:46 PM

## 2020-09-28 LAB — CBC
HCT: 31.8 % — ABNORMAL LOW (ref 36.0–46.0)
Hemoglobin: 11 g/dL — ABNORMAL LOW (ref 12.0–15.0)
MCH: 32.8 pg (ref 26.0–34.0)
MCHC: 34.6 g/dL (ref 30.0–36.0)
MCV: 94.9 fL (ref 80.0–100.0)
Platelets: 282 10*3/uL (ref 150–400)
RBC: 3.35 MIL/uL — ABNORMAL LOW (ref 3.87–5.11)
RDW: 14.9 % (ref 11.5–15.5)
WBC: 9.4 10*3/uL (ref 4.0–10.5)
nRBC: 0 % (ref 0.0–0.2)

## 2020-09-28 LAB — COMPREHENSIVE METABOLIC PANEL
ALT: 44 U/L (ref 0–44)
AST: 28 U/L (ref 15–41)
Albumin: 2.5 g/dL — ABNORMAL LOW (ref 3.5–5.0)
Alkaline Phosphatase: 39 U/L (ref 38–126)
Anion gap: 8 (ref 5–15)
BUN: 9 mg/dL (ref 8–23)
CO2: 25 mmol/L (ref 22–32)
Calcium: 7.2 mg/dL — ABNORMAL LOW (ref 8.9–10.3)
Chloride: 109 mmol/L (ref 98–111)
Creatinine, Ser: 0.69 mg/dL (ref 0.44–1.00)
GFR calc non Af Amer: >60 mL/min (ref >60)
Glucose, Bld: 96 mg/dL (ref 70–99)
Potassium: 3.1 mmol/L — ABNORMAL LOW (ref 3.5–5.1)
Sodium: 142 mmol/L (ref 135–145)
Total Bilirubin: 0.9 mg/dL (ref 0.3–1.2)
Total Protein: 5 g/dL — ABNORMAL LOW (ref 6.5–8.1)

## 2020-09-28 MED ORDER — FIDAXOMICIN 200 MG PO TABS: 200.0000 mg | ORAL_TABLET | Freq: Two times a day (BID) | ORAL | 0 refills | Status: AC

## 2020-09-28 MED ORDER — SACCHAROMYCES BOULARDII 250 MG PO CAPS: 250.0000 mg | ORAL_CAPSULE | Freq: Two times a day (BID) | ORAL | 0 refills | Status: AC

## 2020-09-28 MED ORDER — POTASSIUM CHLORIDE CRYS ER 20 MEQ PO TBCR
40.0000 meq | EXTENDED_RELEASE_TABLET | Freq: Two times a day (BID) | ORAL | Status: DC
  Filled 2020-09-28: qty 2

## 2020-09-28 MED ORDER — AMLODIPINE BESYLATE 5 MG PO TABS
5.0000 mg | ORAL_TABLET | Freq: Every day | ORAL | 0 refills | Status: DC
Start: 2020-09-29 — End: 2020-11-08

## 2020-09-28 NOTE — Discharge Summary (Signed)
Physician Discharge Summary  Dana [name]:[id number] DOB: 1935-01-25 DOA: 09/21/2020  PCP: [name], MD  Admit date: 09/21/2020 Discharge date: 09/28/2020  Admitted From: Home Disposition:  SNF  Recommendations for Outpatient Follow-up:  1. Follow up with PCP in 1-2 weeks 2. Follow up with Cardiology as scheduled 3. Follow up with Oncology as scheduled, Dr. [name]  Discharge Condition:Stable CODE STATUS:Full Diet recommendation: Regular   Brief/Interim Summary: 85 y.o.femalewith medical history significant forCOPD, NSCLC in remission since 2010 on Tarceva.  Presented to [facility] ED due to intractable diarrhea for the past 4 days, not responding to Imodium.  Got COVID booster 09/18/20. Shortly after that had onset of diarrhea.  H/o c.diff in 2012.  Has had intermittent diarrhea for the past 10 years on tarceva but usually this gets better with imodium. No recent ABx, no recent travel, no blood in stool.  ED Course:Mild LFT elevations, Creat 2.3 up from 1.0 on 09/18/20.  WBC nl.  BP soft, received 2L LR bolus.  Lactic acid 2.4 and 2.2.  AST 201, ALT 88 up from 25 and 21 on 09/18/20.  Discharge Diagnoses:  Principal Problem:   AKI (acute kidney injury) ([place]) Active Problems:   Cancer of right lung parenchyma (HCC)   COPD GOLD 0/ lama/laba responsive   Choledocholithiasis   Diarrhea   Hypotension   C. difficile diarrhea  Persistent intractable diarrhea 2/2 to C-diff colitis, POA Presented with diarrhea of 4 days duration not improved with home Imodium Hx of C-diff in 2012 C-diff PCR positive on 09/21/20 Started on po vancomycin with taper, completed 2 days Switched to Dificid on 09/23/2020, 200 mg twice daily, to complete 5 more days for total of 10 days tx Added Florastor 250 milligrams twice daily on 09/26/2020 Continue IV fluids to avoid dehydration Continue to encourage oral intake  Non-small cell lung cancer in remission since 2010 Continue to hold off Tarceva  as recommended by oncology in the next 7 days from 09/23/2020 Will need to follow-up with oncology posthospitalization.  Distended gallbladder with sludge and gallstones Right upper quadrant ultrasound done on 09/22/2020 revealed: 1. Distended gallbladder with sludge and small gallstones. No sonographic findings of acute cholecystitis. 2. Intrahepatic and extrahepatic biliary dilatation. No visualized Choledocholithiasis. GI consulted, management per GI. LFTs are downtrending  Elevated liver chemistries, improving LFTs are downtrending.  Resolved post repletion: Hypokalemia, secondary to GI losses from diarrhea Replaced  Resolved post repletion: Hypomagnesemia secondary to GI losses from diarrhea corrected  Physical debility PT assessment recommended SNF Fall precautions TOC consulted to assist with SNF placement. She is receptive to going to rehab first prior to returning home.  Moderate protein calorie malnutrition BMI 16 She states oral supplement gives her diarrhea  Encourage increase in protein calorie intake.  Discharge Instructions   Allergies as of 09/28/2020      Reactions   Amoxicillin Diarrhea   Hx of C Diff    Protonix [pantoprazole Sodium] Rash      Medication List    STOP taking these medications   aspirin EC 81 MG tablet   erlotinib 100 MG tablet Commonly known as: TARCEVA   pantoprazole 40 MG tablet Commonly known as: PROTONIX     TAKE these medications   amLODipine 5 MG tablet Commonly known as: NORVASC Take 1 tablet (5 mg total) by mouth daily. Start taking on: September 29, 2020   augmented betamethasone dipropionate 0.05 % ointment Commonly known as: DIPROLENE-AF Apply 1 application topically 2 (two) times daily as needed (  dry skin).   B-12 1000 MCG Caps Take 1,000 mcg by mouth in the morning and at bedtime.   bismuth subsalicylate 073 XT/06YI suspension Commonly known as: PEPTO BISMOL Take 30 mLs by mouth every three (3) days  as needed.   cholecalciferol 1000 units tablet Commonly known as: VITAMIN D Take 1,000 Units by mouth daily.   CoQ10 100 MG Caps Take 1 capsule by mouth daily.   fidaxomicin 200 MG Tabs tablet Commonly known as: DIFICID Take 1 tablet (200 mg total) by mouth 2 (two) times daily for 5 days.   GAS-X PO Take 1 tablet by mouth 2 (two) times daily as needed (for gas).   loperamide 2 MG tablet Commonly known as: IMODIUM A-D Take 1 mg by mouth 4 (four) times daily as needed for diarrhea or loose stools.   loratadine 10 MG tablet Commonly known as: CLARITIN Take 10 mg by mouth daily as needed for allergies.   meloxicam 15 MG tablet Commonly known as: MOBIC Take 15 mg by mouth daily.   mirtazapine 15 MG tablet Commonly known as: REMERON TAKE 1 TABLET BY MOUTH AT BEDTIME   saccharomyces boulardii 250 MG capsule Commonly known as: FLORASTOR Take 1 capsule (250 mg total) by mouth 2 (two) times daily for 14 days.   simvastatin 20 MG tablet Commonly known as: ZOCOR Take 1 tablet (20 mg total) by mouth every Monday, Wednesday, and Friday.   Stiolto Respimat 2.5-2.5 MCG/ACT Aers Generic drug: Tiotropium Bromide-Olodaterol INHALE 2 PUFFS BY MOUTH ONCE DAILY What changed: See the new instructions.   SYSTANE OP Place 1 drop into both eyes at bedtime as needed (for dry eyes.).   Voltaren 1 % Gel Generic drug: diclofenac Sodium Apply 2 g topically 4 (four) times daily as needed (pain).       Follow-up Information    [name], MD. Schedule an appointment as soon as possible for a visit in 2 week(s).   Specialty: Family Medicine Contact information: [place] [address] [phone]        Josue Hector, MD .   Specialty: Cardiology Contact information: [phone] [address] [place] [address] [phone]        Curt Bears, MD. Schedule an appointment as soon as possible for a visit.   Specialty: Oncology Contact  information: [address] [place] [address] [phone]              Allergies  Allergen Reactions  . Amoxicillin Diarrhea    Hx of C Diff   . Protonix [Pantoprazole Sodium] Rash    Consultations:  GI  Procedures/Studies: CT ABDOMEN PELVIS WO CONTRAST  Result Date: 09/21/2020 CLINICAL DATA:  Diarrhea, 3 days ago seen for the same symptoms. Booster vaccine 5 days ago. Lethargic. History of lung cancer, COPD, hypertension. EXAM: CT ABDOMEN AND PELVIS WITHOUT CONTRAST TECHNIQUE: Multidetector CT imaging of the abdomen and pelvis was performed following the standard protocol without IV contrast. COMPARISON:  CT abdomen pelvis 05/28/2019. FINDINGS: Lower chest: Bi basilar linear atelectasis versus scarring. Hepatobiliary: No focal liver abnormality is seen. The gallbladder is noted to be hydropic measuring up to 5.7 cm on axial imaging. Layering hyperdensity within its lumen consistent with calcified gallstones. No CT findings suggest gallbladder wall thickening or pericholecystic fluid. Possible mild central intrahepatic biliary dilatation. Interval development of a prominent common bile duct measuring up to 1 cm. The common bile duct does not demonstrate tapering to normal caliber distally. Pancreas:  Unremarkable. No pancreatic ductal dilatation or surrounding inflammatory changes. Spleen: Normal in size without focal abnormality. Adrenals/Urinary Tract: No adrenal nodule bilaterally. No nephrolithiasis, no hydronephrosis, and no contour-deforming renal mass. No ureterolithiasis or hydroureter. The urinary bladder is unremarkable. Stomach/Bowel: Stomach is within normal limits. The appendix is not definitely identified. No evidence of small bowel wall thickening, dilatation, or inflammatory changes. The colon is distended and fluid-filled. No large bowel wall thickening. No definite pericolonic fat stranding. Redemonstration of medialized mobile cecum. No pneumatosis.  Vascular/Lymphatic: No abdominal aorta or iliac aneurysm. Moderate to severe atherosclerotic plaque of the aorta and its branches. No abdominal, pelvic, or inguinal lymphadenopathy. Reproductive: Prostate is unremarkable. Other: No intraperitoneal free fluid. No intraperitoneal free gas. No organized fluid collection. Musculoskeletal: No abdominal wall hernia or abnormality. Fatty infiltration of the gluteal and paraspinal musculature bilaterally. Dextrocurvature of lumbar spine centered at the L2-L3 level. Associated multilevel moderate severe degenerative changes of the spine. No suspicious lytic or blastic osseous lesions. No acute displaced fracture. IMPRESSION: 1. Cholelithiasis with interval development of a hydropic gallbladder and prominent common bile duct that does not taper to normal a caliber distally. Findings concerning for choledocholithiasis. Consider right upper quadrant ultrasound or MRCP for further evaluation. 2. Findings consistent with a rapid bowel transit time. No definite colitis. Electronically Signed   By: Iven Finn M.D.   On: 09/21/2020 17:04   DG Chest Portable 1 View  Result Date: 09/21/2020 CLINICAL DATA:  Cough.  History of lung cancer. EXAM: PORTABLE CHEST 1 VIEW COMPARISON:  11/25/2018 FINDINGS: Chronic scarring in the right lung base medially unchanged. Blunting right costophrenic angle unchanged. Heart size and vascularity normal. No heart failure identified. Left lung clear. IMPRESSION: Chronic scarring right lung base unchanged. No superimposed acute abnormality. Electronically Signed   By: Franchot Gallo M.D.   On: 09/21/2020 12:01   US Abdomen Limited RUQ  Result Date: 09/22/2020 CLINICAL DATA:  Right upper quadrant pain.  Choledocholithiasis. EXAM: ULTRASOUND ABDOMEN LIMITED RIGHT UPPER QUADRANT COMPARISON:  Noncontrast abdominopelvic CT yesterday. FINDINGS: Gallbladder: Distended with layering sludge. Small gallstones. No wall thickening. No pericholecystic  fluid. No sonographic Murphy sign noted by sonographer. Common bile duct: Diameter: Dilated measuring 13 mm proximally. Slightly diminished visualization distally near the pancreatic head. There is no visualized choledocholithiasis. Liver: Central intrahepatic biliary ductal dilatation. No focal lesion identified. Within normal limits in parenchymal echogenicity. Portal vein is patent on color Doppler imaging with normal direction of blood flow towards the liver. Other: No right upper quadrant ascites. IMPRESSION: 1. Distended gallbladder with sludge and small gallstones. No sonographic findings of acute cholecystitis. 2. Intrahepatic and extrahepatic biliary dilatation. No visualized choledocholithiasis. Electronically Signed   By: Keith Rake M.D.   On: 09/22/2020 17:10     Subjective: Eager to go to rehab  Discharge Exam: Vitals:   09/28/20 0545 09/28/20 0921  BP: 121/76   Pulse: 99   Resp: 14   Temp: 98.1 F (36.7 C)   SpO2: 91% 93%   Vitals:   09/27/20 1930 09/27/20 2144 09/28/20 0545 09/28/20 0921  BP:  134/82 121/76   Pulse:  (!) 107 99   Resp:  14 14   Temp:  99.3 F (37.4 C) 98.1 F (36.7 C)   TempSrc:  Oral Oral   SpO2: 92% 95% 91% 93%  Weight:      Height:        General: Pt is alert, awake, not in acute distress Cardiovascular: RRR, S1/S2 +, no rubs, no  gallops Respiratory: CTA bilaterally, no wheezing, no rhonchi Abdominal: Soft, NT, ND, bowel sounds + Extremities: no edema, no cyanosis   The results of significant diagnostics from this hospitalization (including imaging, microbiology, ancillary and laboratory) are listed below for reference.     Microbiology: Recent Results (from the past 240 hour(s))  Respiratory Panel by RT PCR (Flu A&B, Covid) - Nasopharyngeal Swab     Status: None   Collection Time: 09/21/20 11:32 AM   Specimen: Nasopharyngeal Swab  Result Value Ref Range Status   SARS Coronavirus 2 by RT PCR NEGATIVE NEGATIVE Final    Comment:  (NOTE) SARS-CoV-2 target nucleic acids are NOT DETECTED.  The SARS-CoV-2 RNA is generally detectable in upper respiratoy specimens during the acute phase of infection. The lowest concentration of SARS-CoV-2 viral copies this assay can detect is 131 copies/mL. A negative result does not preclude SARS-Cov-2 infection and should not be used as the sole basis for treatment or other patient management decisions. A negative result may occur with  improper specimen collection/handling, submission of specimen other than nasopharyngeal swab, presence of viral mutation(s) within the areas targeted by this assay, and inadequate number of viral copies (<131 copies/mL). A negative result must be combined with clinical observations, patient history, and epidemiological information. The expected result is Negative.  Fact Sheet for Patients:  PinkCheek.be  Fact Sheet for Healthcare Providers:  GravelBags.it  This test is no t yet approved or cleared by the [place] FDA and  has been authorized for detection and/or diagnosis of SARS-CoV-2 by FDA under an Emergency Use Authorization (EUA). This EUA will remain  in effect (meaning this test can be used) for the duration of the COVID-19 declaration under Section 564(b)(1) of the Act, 21 U.S.C. section 360bbb-3(b)(1), unless the authorization is terminated or revoked sooner.     Influenza A by PCR NEGATIVE NEGATIVE Final   Influenza B by PCR NEGATIVE NEGATIVE Final    Comment: (NOTE) The Xpert Xpress SARS-CoV-2/FLU/RSV assay is intended as an aid in  the diagnosis of influenza from Nasopharyngeal swab specimens and  should not be used as a sole basis for treatment. Nasal washings and  aspirates are unacceptable for Xpert Xpress SARS-CoV-2/FLU/RSV  testing.  Fact Sheet for Patients: PinkCheek.be  Fact Sheet for Healthcare  Providers: GravelBags.it  This test is not yet approved or cleared by the [place] FDA and  has been authorized for detection and/or diagnosis of SARS-CoV-2 by  FDA under an Emergency Use Authorization (EUA). This EUA will remain  in effect (meaning this test can be used) for the duration of the  Covid-19 declaration under Section 564(b)(1) of the Act, 21  U.S.C. section 360bbb-3(b)(1), unless the authorization is  terminated or revoked. Performed at [facility], [address] [place], [place] [address]   C Difficile Quick Screen w PCR reflex     Status: Abnormal   Collection Time: 09/21/20  3:35 PM   Specimen: STOOL  Result Value Ref Range Status   C Diff antigen POSITIVE (A) NEGATIVE Final   C Diff toxin POSITIVE (A) NEGATIVE Final   C Diff interpretation Toxin producing C. difficile detected.  Final    Comment: CRITICAL RESULT CALLED TO, READ BACK BY AND VERIFIED WITH: Rosemarie Ax RN [id number] 09/21/20 A BROWNING Performed at [facility], [place] [address], [place], [place] [address]   Gastrointestinal Panel by PCR , Stool     Status: None   Collection Time: 09/21/20  3:35  PM   Specimen: Stool  Result Value Ref Range Status   Campylobacter species NOT DETECTED NOT DETECTED Final   Plesimonas shigelloides NOT DETECTED NOT DETECTED Final   Salmonella species NOT DETECTED NOT DETECTED Final   Yersinia enterocolitica NOT DETECTED NOT DETECTED Final   Vibrio species NOT DETECTED NOT DETECTED Final   Vibrio cholerae NOT DETECTED NOT DETECTED Final   Enteroaggregative E coli (EAEC) NOT DETECTED NOT DETECTED Final   Enteropathogenic E coli (EPEC) NOT DETECTED NOT DETECTED Final   Enterotoxigenic E coli (ETEC) NOT DETECTED NOT DETECTED Final   Shiga like toxin producing E coli (STEC) NOT DETECTED NOT DETECTED Final   Shigella/Enteroinvasive E coli (EIEC) NOT DETECTED NOT DETECTED Final   Cryptosporidium NOT DETECTED NOT DETECTED Final    Cyclospora cayetanensis NOT DETECTED NOT DETECTED Final   Entamoeba histolytica NOT DETECTED NOT DETECTED Final   Giardia lamblia NOT DETECTED NOT DETECTED Final   Adenovirus F40/41 NOT DETECTED NOT DETECTED Final   Astrovirus NOT DETECTED NOT DETECTED Final   Norovirus GI/GII NOT DETECTED NOT DETECTED Final   Rotavirus A NOT DETECTED NOT DETECTED Final   Sapovirus (I, II, IV, and V) NOT DETECTED NOT DETECTED Final    Comment: Performed at [facility], [address] [place], [place] [address]  Respiratory Panel by RT PCR (Flu A&B, Covid) - Nasopharyngeal Swab     Status: None   Collection Time: 09/27/20  5:53 PM   Specimen: Nasopharyngeal Swab  Result Value Ref Range Status   SARS Coronavirus 2 by RT PCR NEGATIVE NEGATIVE Final    Comment: (NOTE) SARS-CoV-2 target nucleic acids are NOT DETECTED.  The SARS-CoV-2 RNA is generally detectable in upper respiratoy specimens during the acute phase of infection. The lowest concentration of SARS-CoV-2 viral copies this assay can detect is 131 copies/mL. A negative result does not preclude SARS-Cov-2 infection and should not be used as the sole basis for treatment or other patient management decisions. A negative result may occur with  improper specimen collection/handling, submission of specimen other than nasopharyngeal swab, presence of viral mutation(s) within the areas targeted by this assay, and inadequate number of viral copies (<131 copies/mL). A negative result must be combined with clinical observations, patient history, and epidemiological information. The expected result is Negative.  Fact Sheet for Patients:  PinkCheek.be  Fact Sheet for Healthcare Providers:  GravelBags.it  This test is no t yet approved or cleared by the [place] FDA and  has been authorized for detection and/or diagnosis of SARS-CoV-2 by FDA under an Emergency Use Authorization  (EUA). This EUA will remain  in effect (meaning this test can be used) for the duration of the COVID-19 declaration under Section 564(b)(1) of the Act, 21 U.S.C. section 360bbb-3(b)(1), unless the authorization is terminated or revoked sooner.     Influenza A by PCR NEGATIVE NEGATIVE Final   Influenza B by PCR NEGATIVE NEGATIVE Final    Comment: (NOTE) The Xpert Xpress SARS-CoV-2/FLU/RSV assay is intended as an aid in  the diagnosis of influenza from Nasopharyngeal swab specimens and  should not be used as a sole basis for treatment. Nasal washings and  aspirates are unacceptable for Xpert Xpress SARS-CoV-2/FLU/RSV  testing.  Fact Sheet for Patients: PinkCheek.be  Fact Sheet for Healthcare Providers: GravelBags.it  This test is not yet approved or cleared by the [place] FDA and  has been authorized for detection and/or diagnosis of SARS-CoV-2 by  FDA under an Emergency Use Authorization (EUA). This EUA  will remain  in effect (meaning this test can be used) for the duration of the  Covid-19 declaration under Section 564(b)(1) of the Act, 21  U.S.C. section 360bbb-3(b)(1), unless the authorization is  terminated or revoked. Performed at [facility], [place] [address] [place], [place] [address]      Labs: BNP (last 3 results) No results for input(s): BNP in the last 8760 hours. Basic Metabolic Panel: Recent Labs  Lab 09/22/20 0515 09/22/20 0515 09/23/20 9562 09/24/20 0650 09/25/20 0635 09/26/20 0632 09/28/20 0511  NA 141   < > 141 141 143 141 142  K 2.4*   < > 3.6 4.0 3.7 3.8 3.1*  CL 109   < > 112* 117* 116* 113* 109  CO2 20*   < > 18* 18* 21* 20* 25  GLUCOSE 106*   < > 84 89 93 95 96  BUN 32*   < > 24* 17 12 10 9   CREATININE 1.45*   < > 0.95 0.77 0.69 0.70 0.69  CALCIUM 7.1*   < > 6.8* 6.7* 6.7* 6.8* 7.2*  MG 1.2*  --  2.8*  --   --   --   --    < > = values in this interval not  displayed.   Liver Function Tests: Recent Labs  Lab 09/23/20 0609 09/24/20 0650 09/25/20 0635 09/26/20 0632 09/28/20 0511  AST 75* 53* 40 38 28  ALT 65* 61* 57* 56* 44  ALKPHOS 32* 35* 32* 32* 39  BILITOT 0.7 0.7 0.7 0.7 0.9  PROT 4.9* 4.9* 4.6* 4.8* 5.0*  ALBUMIN 2.3* 2.4* 2.4* 2.4* 2.5*   No results for input(s): LIPASE, AMYLASE in the last 168 hours. No results for input(s): AMMONIA in the last 168 hours. CBC: Recent Labs  Lab 09/22/20 0515 09/23/20 0609 09/24/20 0650 09/28/20 0511  WBC 6.0 6.8 7.4 9.4  NEUTROABS  --  5.0 5.4  --   HGB 11.4* 11.1* 11.0* 11.0*  HCT 33.7* 32.4* 33.2* 31.8*  MCV 96.3 94.5 97.1 94.9  PLT 204 186 239 282   Cardiac Enzymes: No results for input(s): CKTOTAL, CKMB, CKMBINDEX, TROPONINI in the last 168 hours. BNP: Invalid input(s): POCBNP CBG: No results for input(s): GLUCAP in the last 168 hours. D-Dimer No results for input(s): DDIMER in the last 72 hours. Hgb A1c No results for input(s): HGBA1C in the last 72 hours. Lipid Profile No results for input(s): CHOL, HDL, LDLCALC, TRIG, CHOLHDL, LDLDIRECT in the last 72 hours. Thyroid function studies No results for input(s): TSH, T4TOTAL, T3FREE, THYROIDAB in the last 72 hours.  Invalid input(s): FREET3 Anemia work up No results for input(s): VITAMINB12, FOLATE, FERRITIN, TIBC, IRON, RETICCTPCT in the last 72 hours. Urinalysis    Component Value Date/Time   COLORURINE YELLOW 05/28/2019 2225   APPEARANCEUR CLEAR 05/28/2019 2225   LABSPEC 1.010 05/28/2019 2225   PHURINE 6.5 05/28/2019 2225   GLUCOSEU NEGATIVE 05/28/2019 2225   HGBUR NEGATIVE 05/28/2019 2225   [place] 05/28/2019 2225   KETONESUR NEGATIVE 05/28/2019 2225   PROTEINUR NEGATIVE 05/28/2019 2225   UROBILINOGEN 1.0 01/06/2013 1029   NITRITE NEGATIVE 05/28/2019 2225   LEUKOCYTESUR SMALL (A) 05/28/2019 2225   Sepsis Labs Invalid input(s): PROCALCITONIN,  WBC,  LACTICIDVEN Microbiology Recent Results (from  the past 240 hour(s))  Respiratory Panel by RT PCR (Flu A&B, Covid) - Nasopharyngeal Swab     Status: None   Collection Time: 09/21/20 11:32 AM   Specimen: Nasopharyngeal Swab  Result Value Ref  Range Status   SARS Coronavirus 2 by RT PCR NEGATIVE NEGATIVE Final    Comment: (NOTE) SARS-CoV-2 target nucleic acids are NOT DETECTED.  The SARS-CoV-2 RNA is generally detectable in upper respiratoy specimens during the acute phase of infection. The lowest concentration of SARS-CoV-2 viral copies this assay can detect is 131 copies/mL. A negative result does not preclude SARS-Cov-2 infection and should not be used as the sole basis for treatment or other patient management decisions. A negative result may occur with  improper specimen collection/handling, submission of specimen other than nasopharyngeal swab, presence of viral mutation(s) within the areas targeted by this assay, and inadequate number of viral copies (<131 copies/mL). A negative result must be combined with clinical observations, patient history, and epidemiological information. The expected result is Negative.  Fact Sheet for Patients:  PinkCheek.be  Fact Sheet for Healthcare Providers:  GravelBags.it  This test is no t yet approved or cleared by the [place] FDA and  has been authorized for detection and/or diagnosis of SARS-CoV-2 by FDA under an Emergency Use Authorization (EUA). This EUA will remain  in effect (meaning this test can be used) for the duration of the COVID-19 declaration under Section 564(b)(1) of the Act, 21 U.S.C. section 360bbb-3(b)(1), unless the authorization is terminated or revoked sooner.     Influenza A by PCR NEGATIVE NEGATIVE Final   Influenza B by PCR NEGATIVE NEGATIVE Final    Comment: (NOTE) The Xpert Xpress SARS-CoV-2/FLU/RSV assay is intended as an aid in  the diagnosis of influenza from Nasopharyngeal swab specimens and   should not be used as a sole basis for treatment. Nasal washings and  aspirates are unacceptable for Xpert Xpress SARS-CoV-2/FLU/RSV  testing.  Fact Sheet for Patients: PinkCheek.be  Fact Sheet for Healthcare Providers: GravelBags.it  This test is not yet approved or cleared by the [place] FDA and  has been authorized for detection and/or diagnosis of SARS-CoV-2 by  FDA under an Emergency Use Authorization (EUA). This EUA will remain  in effect (meaning this test can be used) for the duration of the  Covid-19 declaration under Section 564(b)(1) of the Act, 21  U.S.C. section 360bbb-3(b)(1), unless the authorization is  terminated or revoked. Performed at [facility], [address] [place], [place] [address]   C Difficile Quick Screen w PCR reflex     Status: Abnormal   Collection Time: 09/21/20  3:35 PM   Specimen: STOOL  Result Value Ref Range Status   C Diff antigen POSITIVE (A) NEGATIVE Final   C Diff toxin POSITIVE (A) NEGATIVE Final   C Diff interpretation Toxin producing C. difficile detected.  Final    Comment: CRITICAL RESULT CALLED TO, READ BACK BY AND VERIFIED WITH: Rosemarie Ax RN [id number] 09/21/20 A BROWNING Performed at [facility], [place] [address], [place], [place] [address]   Gastrointestinal Panel by PCR , Stool     Status: None   Collection Time: 09/21/20  3:35 PM   Specimen: Stool  Result Value Ref Range Status   Campylobacter species NOT DETECTED NOT DETECTED Final   Plesimonas shigelloides NOT DETECTED NOT DETECTED Final   Salmonella species NOT DETECTED NOT DETECTED Final   Yersinia enterocolitica NOT DETECTED NOT DETECTED Final   Vibrio species NOT DETECTED NOT DETECTED Final   Vibrio cholerae NOT DETECTED NOT DETECTED Final   Enteroaggregative E coli (EAEC) NOT DETECTED NOT DETECTED Final   Enteropathogenic E coli (EPEC) NOT DETECTED NOT DETECTED Final  Enterotoxigenic E coli  (ETEC) NOT DETECTED NOT DETECTED Final   Shiga like toxin producing E coli (STEC) NOT DETECTED NOT DETECTED Final   Shigella/Enteroinvasive E coli (EIEC) NOT DETECTED NOT DETECTED Final   Cryptosporidium NOT DETECTED NOT DETECTED Final   Cyclospora cayetanensis NOT DETECTED NOT DETECTED Final   Entamoeba histolytica NOT DETECTED NOT DETECTED Final   Giardia lamblia NOT DETECTED NOT DETECTED Final   Adenovirus F40/41 NOT DETECTED NOT DETECTED Final   Astrovirus NOT DETECTED NOT DETECTED Final   Norovirus GI/GII NOT DETECTED NOT DETECTED Final   Rotavirus A NOT DETECTED NOT DETECTED Final   Sapovirus (I, II, IV, and V) NOT DETECTED NOT DETECTED Final    Comment: Performed at [facility], [address] [place], [place] [address]  Respiratory Panel by RT PCR (Flu A&B, Covid) - Nasopharyngeal Swab     Status: None   Collection Time: 09/27/20  5:53 PM   Specimen: Nasopharyngeal Swab  Result Value Ref Range Status   SARS Coronavirus 2 by RT PCR NEGATIVE NEGATIVE Final    Comment: (NOTE) SARS-CoV-2 target nucleic acids are NOT DETECTED.  The SARS-CoV-2 RNA is generally detectable in upper respiratoy specimens during the acute phase of infection. The lowest concentration of SARS-CoV-2 viral copies this assay can detect is 131 copies/mL. A negative result does not preclude SARS-Cov-2 infection and should not be used as the sole basis for treatment or other patient management decisions. A negative result may occur with  improper specimen collection/handling, submission of specimen other than nasopharyngeal swab, presence of viral mutation(s) within the areas targeted by this assay, and inadequate number of viral copies (<131 copies/mL). A negative result must be combined with clinical observations, patient history, and epidemiological information. The expected result is Negative.  Fact Sheet for Patients:  PinkCheek.be  Fact Sheet for Healthcare  Providers:  GravelBags.it  This test is no t yet approved or cleared by the [place] FDA and  has been authorized for detection and/or diagnosis of SARS-CoV-2 by FDA under an Emergency Use Authorization (EUA). This EUA will remain  in effect (meaning this test can be used) for the duration of the COVID-19 declaration under Section 564(b)(1) of the Act, 21 U.S.C. section 360bbb-3(b)(1), unless the authorization is terminated or revoked sooner.     Influenza A by PCR NEGATIVE NEGATIVE Final   Influenza B by PCR NEGATIVE NEGATIVE Final    Comment: (NOTE) The Xpert Xpress SARS-CoV-2/FLU/RSV assay is intended as an aid in  the diagnosis of influenza from Nasopharyngeal swab specimens and  should not be used as a sole basis for treatment. Nasal washings and  aspirates are unacceptable for Xpert Xpress SARS-CoV-2/FLU/RSV  testing.  Fact Sheet for Patients: PinkCheek.be  Fact Sheet for Healthcare Providers: GravelBags.it  This test is not yet approved or cleared by the [place] FDA and  has been authorized for detection and/or diagnosis of SARS-CoV-2 by  FDA under an Emergency Use Authorization (EUA). This EUA will remain  in effect (meaning this test can be used) for the duration of the  Covid-19 declaration under Section 564(b)(1) of the Act, 21  U.S.C. section 360bbb-3(b)(1), unless the authorization is  terminated or revoked. Performed at [facility], [place] [address], [place], [place] [address]    Time spent: 30 min  SIGNED:   Marylu Lund, MD  [facility] 09/28/2020, 11:36 AM  If 7PM-7AM, please contact night-coverage

## 2020-09-28 NOTE — TOC Transition Note (Signed)
Transition of Care [facility]) - CM/SW Discharge Note   Patient Details  Name: Dana [name] MRN: [id number] Date of Birth: 03/11/1935  Transition of Care [facility] CM/SW Contact:  Lynnell Catalan, RN Phone Number: 09/28/2020, 1:11 PM   Clinical Narrative:    Pt daughter [name] chose [facility]. Pt to dc there today. DC summary faxed via hub. [facility] contacted for transport. Pt to go to room 1203P and RN to call report to [phone].   Final next level of care: Skilled Nursing Facility Barriers to Discharge: Continued Medical Work up   Patient Goals and CMS Choice Patient states their goals for this hospitalization and ongoing recovery are:: To get better CMS Medicare.gov Compare Post Acute Care list provided to:: Patient Choice offered to / list presented to : Adult Children  Discharge Placement              Patient chooses bed at: [facility] Patient to be transferred to facility by: [place] Name of family member notified: daughter [name] Patient and family notified of of transfer: 09/28/20  Discharge Plan and Services   Discharge Planning Services: CM Consult Post Acute Care Choice: [facility]                               Social Determinants of Health (SDOH) Interventions     Readmission Risk Interventions No flowsheet data found.

## 2020-09-28 NOTE — Progress Notes (Signed)
Report given to Armed forces training and education officer at [place] and rehab [phone].

## 2020-10-10 ENCOUNTER — Ambulatory Visit: Payer: Medicare Other | Admitting: Internal Medicine

## 2020-10-18 ENCOUNTER — Other Ambulatory Visit: Payer: Self-pay

## 2020-10-18 ENCOUNTER — Encounter: Payer: Self-pay | Admitting: Internal Medicine

## 2020-10-18 ENCOUNTER — Ambulatory Visit: Payer: Medicare Other | Admitting: Internal Medicine

## 2020-10-18 DIAGNOSIS — J449 Chronic obstructive pulmonary disease, unspecified: Secondary | ICD-10-CM | POA: Diagnosis not present

## 2020-10-18 DIAGNOSIS — K219 Gastro-esophageal reflux disease without esophagitis: Secondary | ICD-10-CM | POA: Diagnosis not present

## 2020-10-18 NOTE — Progress Notes (Signed)
Dana [name], female    DOB: [birth year]    MRN: [id number]    Brief patient profile:  46 yowf mother of primary care physician in [place] quit smoking 1979 with chronic doe prior  evals with nl spirometry as recently as 12/03/09   07/05/2016  Walked RA x 3 laps @ 185 ft each stopped due to  End of study, nl pace no desat or sob  - Spirometry 07/05/2016  wnl p am spiriva  - CTa chest 07/05/2016 neg pe/ neg ILD, R infrahilar density c/w lung ca vs post RT scar     Oncology note 11/17/18 : DIAGNOSIS: Stage IIIB (T2a, N3, M0) non-small cell lung cancer, adenocarcinoma with positive EGFR mutation diagnosed in January 2010.  PRIOR THERAPY: 1) Status post concurrent chemoradiation with weekly carboplatin and paclitaxel; last dose given March 21, 2009.  2) Tarceva at 150 mg p.o. daily, status post approximately 48 months of treatment, discontinued secondary to persistent diarrhea.  3) status post right Pleurx catheter placement for right nonmalignant pleural effusion. 4) R pleurex Gerhardt 2017 / sp pleurodesis  CURRENT THERAPY: Tarceva 100 mg by mouth daily started 03/19/2013, status post 68 months of treatment.     History of Present Illness  11/25/2018  Pulmonary consultation / Dr Blima Singer re sob   Chief Complaint  Patient presents with  . Pulmonary Consult    Referred by Theadore Nan. Pt c/o SOB x 10 yrs, worse x 3 months. She gets out of breath walking up a flight of stairs.   Dyspnea:  Indolent onset/ As of 3 months prior to [place] worse" to point where struggles step to step and stops at top of one flight  Still able to shop at [employer] / does not check sats  Cough: none Sleep: fine flat on one pillow SABA use: none  rec Goal is to keep your 02 saturation above 90% by walking slower or adjusting 02 flow to reach at least 90%      12/03/2018  f/u ov/Trust Crago re: sob Chief Complaint  Patient presents with  . Follow-up    She states breathing seems  better but she has not been as active. She was started on o2 per PCP- she only uses when she is at home during the day, none with sleep.   Dyspnea:  Has not tried steps yet but seems easier room to room 2lpm and  Typically maintaining > 90% when she checks it  Cough: none Sleeping: fine  SABA use: none 02: 2lpm prn daytime not at hs  rec Start anoro one click each am  - take two good deep drags then rinse and gargle  Adjust 02 saturations to keep it above 90%         06/22/2019  f/u ov/Asaad Gulley re: COPD GOLD 0/worse doe on anoro with assoc uacs/ no longer using 02 Chief Complaint  Patient presents with  . Follow-up    Breathing has been worse since the last visit. She states she feels exhausted and is afraid to go out anywhere alone. She has prod cough with clear sputum.   Dyspnea:  MMRC3 = can't walk 100 yards even at a slow pace at a flat grade s stopping due to sob   Cough: throat clearing esp after anoro Sleeping: ok flat fine SABA use: none 02: none  rec Stop anoro and start stiolto 2 puffs each am in its place Prednisone 10 mg take  4 each am  x 2 days,   2 each am x 2 days,  1 each am x 2 days and stop  Work on inhaler technique:  relax and gently blow all the way out then take a nice smooth deep breath back in, triggering the inhaler at same time you start breathing in.  Hold for up to 5 seconds if you can.  . Rinse and gargle with water when done Stop boniva after this month Protonix 40 mg Take 30-60 min before first meal of the day daily  Please schedule a follow up office visit in 2 weeks, sooner if needed    07/10/2019  f/u ov/Sadik Piascik re: COPD 0 rx stiolto / rash on protonix resolved off  Chief Complaint  Patient presents with  . Follow-up    Breathing is "great".  No new co's.  Dyspnea:  Better but still = MMRC2 = can't walk a nl pace on a flat grade s sob but does fine slow and flat  Cough: much better  Sleeping: flat fine one pillow  SABA use: none  02: none  Overt hb  off ppi  rec Continue stiolto 2 pffs daily - other option Bevespi 2 every 12 hours If getting worse >  Prednisone 10 mg take  4 each am x 2 days,   2 each am x 2 days,  1 each am x 2 days and stop   Pepcid 20 mg after bfast and supper    10/09/2019  f/u ov/Regine Christian re: GOLD 0  stiolto Chief Complaint  Patient presents with  . Follow-up    Breathing is unchanged since the last visit.   Dyspnea:  Up and down ailes at [employer] for up to an hour Cough: no Sleeping: able to lie flat one pillow  SABA use: none 02: none  rec Continue pepcid (famotidine) 20 mg after bfast and supper Try just one puff stiolto each am top see if you can tell any difference with walking at [employer]   . 04/08/2020  f/u ov/Meriem Lemieux re: gold 0  On stiolto one puff daily / covid  second shot feb 5th moderna Chief Complaint  Patient presents with  . Follow-up    Breathing is "ok".  She states does well as long as she stays active. She does not have a rescue inhaler.    Dyspnea:  Still doing [employer] for up to an hour but does it immediately after stiolto (no more than an hour after) Cough: sometimes p walking minimal creamy Sleeping: flat one pillow SABA use: none  02: none  rec Ok to try stiolto 1-2 puffs at least an hour you try to walk at [facility]    10/18/2020  f/u ov/Hutton Pellicane re:  COPD gold 0 better on stiolto x 2 puffs each am  Chief Complaint  Patient presents with  . Follow-up    COPD, breathing is good, hospitalized for C-diff last month   Dyspnea:  Walking with 3 pronged walker and has rollator / house confined  Cough: none Sleeping: able to lie flat  SABA use: none 02: none consistently high even walking   No obvious day to day or daytime variability or assoc excess/ purulent sputum or mucus plugs or hemoptysis or cp or chest tightness, subjective wheeze or overt sinus or hb symptoms.   Sleeping  without nocturnal  or early am exacerbation  of respiratory  c/o's or need for noct saba. Also denies any  obvious fluctuation of symptoms with weather or environmental changes or other aggravating  or alleviating factors except as outlined above   No unusual exposure hx or h/o childhood pna/ asthma or knowledge of premature birth.  Current Allergies, Complete Past Medical History, Past Surgical History, Family History, and Social History were reviewed in [employer] record.  ROS  The following are not active complaints unless bolded Hoarseness, sore throat, dysphagia, dental problems, itching, sneezing,  nasal congestion or discharge of excess mucus or purulent secretions, ear ache,   fever, chills, sweats, unintended wt loss or wt gain, classically pleuritic or exertional cp,  orthopnea pnd or arm/hand feet swelling  or leg swelling, presyncope, palpitations, abdominal pain, anorexia, nausea, vomiting, diarrhea  or change in bowel habits or change in bladder habits, change in stools or change in urine, dysuria, hematuria,  rash, arthralgias, visual complaints, headache, numbness, weakness or ataxia or problems with walking or coordination,  change in mood or  memory.        Current Meds  Medication Sig  . amLODipine (NORVASC) 5 MG tablet Take 1 tablet (5 mg total) by mouth daily.  Marland Kitchen augmented betamethasone dipropionate (DIPROLENE-AF) 0.05 % ointment Apply 1 application topically 2 (two) times daily as needed (dry skin).   . bismuth subsalicylate (PEPTO BISMOL) 262 MG/15ML suspension Take 30 mLs by mouth every three (3) days as needed.  . cholecalciferol (VITAMIN D) 1000 UNITS tablet Take 1,000 Units by mouth daily.   . Coenzyme Q10 (COQ10) 100 MG CAPS Take 1 capsule by mouth daily.   . Cyanocobalamin (B-12) 1000 MCG CAPS Take 1,000 mcg by mouth in the morning and at bedtime.   . diclofenac Sodium (VOLTAREN) 1 % GEL Apply 2 g topically 4 (four) times daily as needed (pain).  Marland Kitchen loperamide (IMODIUM A-D) 2 MG tablet Take 1 mg by mouth 4 (four) times daily as needed for diarrhea or  loose stools.   Marland Kitchen loratadine (CLARITIN) 10 MG tablet Take 10 mg by mouth daily as needed for allergies.   . meloxicam (MOBIC) 15 MG tablet Take 15 mg by mouth daily.  . mirtazapine (REMERON) 15 MG tablet TAKE 1 TABLET BY MOUTH AT BEDTIME (Patient taking differently: Take 15 mg by mouth at bedtime. )  . Polyethyl Glycol-Propyl Glycol (SYSTANE OP) Place 1 drop into both eyes at bedtime as needed (for dry eyes.).   Marland Kitchen Simethicone (GAS-X PO) Take 1 tablet by mouth 2 (two) times daily as needed (for gas).   . simvastatin (ZOCOR) 20 MG tablet Take 1 tablet (20 mg total) by mouth every Monday, Wednesday, and Friday.  Marland Kitchen STIOLTO RESPIMAT 2.5-2.5 MCG/ACT AERS INHALE 2 PUFFS BY MOUTH ONCE DAILY (Patient taking differently: Inhale 2 puffs into the lungs daily. )                      Objective:      slt hoarse amb wf   10/18/2020      108 04/08/2020        111 10/09/2019      111  07/10/2019        115  06/22/2019        114  05/08/2019        115  12/03/2018      120  11/25/18 116 lb (52.6 kg)  11/17/18 117 lb 6.4 oz (53.3 kg)  09/16/18 117 lb 4.8 oz (53.2 kg)     Vital signs reviewed  10/18/2020  - Note at rest 02 sats  98% on RA  HEENT : pt wearing mask not removed for exam due to covid - 19 concerns.   NECK :  without JVD/Nodes/TM/ nl carotid upstrokes bilaterally   LUNGS: no acc muscle use,  Min barrel  contour chest wall with bilateral  slightly decreased bs s audible wheeze and  without cough on insp or exp maneuvers and min  Hyperresonant  to  percussion bilaterally     CV:  RRR  no s3 or murmur or increase in P2, and no edema   ABD:  soft and nontender with pos end  insp Hoover's  in the supine position. No bruits or organomegaly appreciated, bowel sounds nl  MS:   Nl gait/  ext warm without deformities, calf tenderness, cyanosis or clubbing No obvious joint restrictions   SKIN: warm and dry without lesions    NEURO:  alert, approp, nl sensorium with  no motor or  cerebellar deficits apparent.               Assessment

## 2020-10-18 NOTE — Patient Instructions (Addendum)
Ok to use pepcid 20 mg after bfast and supper   GERD (REFLUX)  is an extremely common cause of respiratory symptoms just like yours , many times with no obvious heartburn at all.    It can be treated with medication, but also with lifestyle changes including elevation of the head of your bed (ideally with 6 -8inch blocks under the headboard of your bed),  Smoking cessation, avoidance of late meals, excessive alcohol, and avoid fatty foods, chocolate, peppermint, colas, red wine, and acidic juices such as orange juice.  NO MINT OR MENTHOL PRODUCTS SO NO COUGH DROPS  USE SUGARLESS CANDY INSTEAD (Jolley ranchers or Stover's or Life Savers) or even ice chips will also do - the key is to swallow to prevent all throat clearing. NO OIL BASED VITAMINS - use powdered substitutes.  Avoid fish oil when coughing.  Please schedule a follow up visit in 6 months but call sooner if needed

## 2020-10-19 ENCOUNTER — Encounter: Payer: Self-pay | Admitting: Internal Medicine

## 2020-10-19 NOTE — Assessment & Plan Note (Addendum)
Quit smoking 1979 07/05/2016  Walked RA x 3 laps @ 185 ft each stopped due to  End of study, nl pace no desat or sob  - Spirometry 07/05/2016  wnl p am spiriva  - CTa chest 07/05/2016 neg pe/ neg ILD, R infrahilar density c/w lung ca vs post RT scar   Chest CT w contrast  09/12/18  Stable CT of the chest - 11/25/2018   Walked RA  2 laps @ 24ft each @ moderate pace  stopped due to  desats to 86% corrected on 2lpm  - HRCT  11/28/18 1. No findings to suggest interstitial lung disease. 2. Chronic postradiation changes in the right lung and evidence of prior right-sided pleurodesis redemonstrated, as above. - PFT's  12/03/2018  FEV1 1.53 (86 % ) ratio 80  p 18 % improvement from saba p nothing prior to study with DLCO  52/52 % corrects to 80 % for alv volume  With minimal curvature   - 06/22/2019   Walked RA  2 laps @  approx 289ft each @ slow to moderate pace  stopped due to  End of study, min sob, no desats   - 06/22/2019   > try stiolto instead of anoro x 2 weeks > better - 10/09/2019    try stiolto just one puff each am - CT 10/16/2019 Mild centrilobular and paraseptal emphysematous changes, upper lung Predominant. - 04/08/2020  After extensive coaching inhaler device,  effectiveness =    90% with smi > continue stiolto altenate 1 with 2 pffs  > does better on 2 puffs q am   Pt is Group B in terms of symptom/risk and laba/lama therefore appropriate rx at this point >>>  Continue stiolto 2 each am and approp saba  I spent extra time with pt today reviewing appropriate use of albuterol for prn use on exertion with the following points: 1) saba is for relief of sob that does not improve by walking a slower pace or resting but rather if the pt does not improve after trying this first. 2) If the pt is convinced, as many are, that saba helps recover from activity faster then it's easy to tell if this is the case by re-challenging : ie stop, take the inhaler, then p 5 minutes try the exact same activity  (intensity of workload) that just caused the symptoms and see if they are substantially diminished or not after saba 3) if there is an activity that reproducibly causes the symptoms, try the saba 15 min before the activity on alternate days   If in fact the saba really does help, then fine to continue to use it prn but advised may need to look closer at the maintenance regimen being used to achieve better control of airways disease with exertion.      >>> f/u q 6 months, call sooner if needed           Each maintenance medication was reviewed in detail including emphasizing most importantly the difference between maintenance and prns and under what circumstances the prns are to be triggered using an action plan format where appropriate.  Total time for H and P, chart review, counseling, reviewing smi device and generating customized AVS unique to this office visit / charting = 15 min

## 2020-10-19 NOTE — Assessment & Plan Note (Signed)
Intol to ppi = rash as of 06/2019  Reminded ok to take pepcid 20 mg bid and gerd diet also reviewed

## 2020-10-24 ENCOUNTER — Encounter (HOSPITAL_BASED_OUTPATIENT_CLINIC_OR_DEPARTMENT_OTHER): Payer: Self-pay

## 2020-10-24 ENCOUNTER — Other Ambulatory Visit: Payer: Self-pay

## 2020-10-24 ENCOUNTER — Emergency Department (HOSPITAL_BASED_OUTPATIENT_CLINIC_OR_DEPARTMENT_OTHER)
Admission: EM | Admit: 2020-10-24 | Discharge: 2020-10-24 | Disposition: A | Discharge status: Home / Self Care | Payer: Medicare Other | Attending: Emergency Medicine | Admitting: Emergency Medicine

## 2020-10-24 ENCOUNTER — Inpatient Hospital Stay: Payer: Medicare Other

## 2020-10-24 ENCOUNTER — Ambulatory Visit (HOSPITAL_COMMUNITY): Payer: Medicare Other

## 2020-10-24 DIAGNOSIS — J449 Chronic obstructive pulmonary disease, unspecified: Secondary | ICD-10-CM | POA: Insufficient documentation

## 2020-10-24 DIAGNOSIS — Z7951 Long term (current) use of inhaled steroids: Secondary | ICD-10-CM | POA: Diagnosis not present

## 2020-10-24 DIAGNOSIS — R197 Diarrhea, unspecified: Secondary | ICD-10-CM | POA: Diagnosis present

## 2020-10-24 DIAGNOSIS — I10 Essential (primary) hypertension: Secondary | ICD-10-CM | POA: Insufficient documentation

## 2020-10-24 DIAGNOSIS — Z87891 Personal history of nicotine dependence: Secondary | ICD-10-CM | POA: Diagnosis not present

## 2020-10-24 DIAGNOSIS — Z79899 Other long term (current) drug therapy: Secondary | ICD-10-CM | POA: Diagnosis not present

## 2020-10-24 DIAGNOSIS — R000 Tachycardia, unspecified: Secondary | ICD-10-CM | POA: Diagnosis not present

## 2020-10-24 DIAGNOSIS — Z85118 Personal history of other malignant neoplasm of bronchus and lung: Secondary | ICD-10-CM | POA: Insufficient documentation

## 2020-10-24 LAB — CBC WITH DIFFERENTIAL/PLATELET
Abs Immature Granulocytes: 0.03 10*3/uL (ref 0.00–0.07)
Basophils Absolute: 0 10*3/uL (ref 0.0–0.1)
Basophils Relative: 0 %
Eosinophils Absolute: 0.2 10*3/uL (ref 0.0–0.5)
Eosinophils Relative: 3 %
HCT: 30.5 % — ABNORMAL LOW (ref 36.0–46.0)
Hemoglobin: 10.1 g/dL — ABNORMAL LOW (ref 12.0–15.0)
Immature Granulocytes: 0 %
Lymphocytes Relative: 8 %
Lymphs Abs: 0.6 10*3/uL — ABNORMAL LOW (ref 0.7–4.0)
MCH: 32.5 pg (ref 26.0–34.0)
MCHC: 33.1 g/dL (ref 30.0–36.0)
MCV: 98.1 fL (ref 80.0–100.0)
Monocytes Absolute: 0.6 10*3/uL (ref 0.1–1.0)
Monocytes Relative: 9 %
Neutro Abs: 5.4 10*3/uL (ref 1.7–7.7)
Neutrophils Relative %: 80 %
Platelets: 252 10*3/uL (ref 150–400)
RBC: 3.11 MIL/uL — ABNORMAL LOW (ref 3.87–5.11)
RDW: 15.9 % — ABNORMAL HIGH (ref 11.5–15.5)
WBC: 6.9 10*3/uL (ref 4.0–10.5)
nRBC: 0 % (ref 0.0–0.2)

## 2020-10-24 LAB — BASIC METABOLIC PANEL
Anion gap: 8 (ref 5–15)
BUN: 23 mg/dL (ref 8–23)
CO2: 26 mmol/L (ref 22–32)
Calcium: 8.2 mg/dL — ABNORMAL LOW (ref 8.9–10.3)
Chloride: 106 mmol/L (ref 98–111)
Creatinine, Ser: 0.85 mg/dL (ref 0.44–1.00)
GFR, Estimated: >60 mL/min (ref >60)
Glucose, Bld: 92 mg/dL (ref 70–99)
Potassium: 3.6 mmol/L (ref 3.5–5.1)
Sodium: 140 mmol/L (ref 135–145)

## 2020-10-24 LAB — MAGNESIUM: Magnesium: 1.3 mg/dL — ABNORMAL LOW (ref 1.7–2.4)

## 2020-10-24 MED ORDER — SODIUM CHLORIDE 0.9 % IV SOLN
INTRAVENOUS | Status: DC | PRN
  Administered 2020-10-24: 250 mL via INTRAVENOUS

## 2020-10-24 MED ORDER — MAGNESIUM SULFATE 2 GM/50ML IV SOLN
2.0000 g | Freq: Once | INTRAVENOUS | Status: AC
  Administered 2020-10-24: 2 g via INTRAVENOUS
  Filled 2020-10-24: qty 50

## 2020-10-24 NOTE — ED Triage Notes (Signed)
Pt arrives with reports of recent C.Diff infection that caused her to be admitted and then transferred to a rehab center, pt come home from rehab last week but thinks that she may have C.Diff again. Was supposed to have a CT scan today but canceled and came here.

## 2020-10-24 NOTE — Discharge Instructions (Addendum)
You were seen in the emergency department for evaluation of continued loose stools after being diagnosed with Covid in September.  Your lab work showed your magnesium was a little low and this was repleted.  You were unable to provide a stool sample here so we could not test you for C. difficile.  Please contact your primary care doctor for close follow-up.  Return to the emergency department if any worsening or concerning symptoms

## 2020-10-24 NOTE — ED Provider Notes (Signed)
[facility] Provider Note   CSN: [id number] Arrival date & time: 10/24/20  4008     History Chief Complaint  Patient presents with  . Diarrhea    Dana [name] is a 85 y.o. female.  She has a history of breast cancer on therapy.  She was admitted late September for diarrhea and was found to be C. difficile positive.  She needed to go to rehab after discharge.  She has been home for about a week.  She is eating and drinking well.  She said she still has not had a formed stool since coming out with C. difficile and had increased watery diarrhea yesterday.  She is concerned she may have C. difficile again.  No fevers or chills.  No abdominal pain.  No urinary symptoms.  The history is provided by the patient.  Diarrhea Quality:  Semi-solid and watery Severity:  Moderate Onset quality:  Gradual Timing:  Intermittent Progression:  Unchanged Relieved by:  Nothing Worsened by:  Nothing Ineffective treatments:  None tried Associated symptoms: no abdominal pain, no chills, no recent cough, no fever, no headaches and no vomiting        Past Medical History:  Diagnosis Date  . Arthritis    HANDS,  WRISTS  . COPD (chronic obstructive pulmonary disease) ([place])   . Depression 04/04/2017  . Dyspnea on exertion   . Encounter for therapeutic drug monitoring 10/01/2016  . GERD (gastroesophageal reflux disease)   . Hemorrhoid   . History of anal fissures   . History of lung cancer ONCOLOGIST--  DR [facility]--  LAST CT ,  NO RECURRENCE OR METS   DX JAN 2010 --  STAGE IIIA  NON-SMALL CELL ADENOCARCINOMA (RIGHT MIDDLE LOBE)---  S/P  CHEMORADIATIO THERAPY  (COMPLETE 03-21-2009)  . HTN (hypertension) 10/10/2017  . Imbalance 06/04/2017  . lung ca dx'd 2010  . Normal cardiac stress test    2007  PER PT  . Osteoporosis   . Rash, skin    RIGHT FOREARM/ HAND  . Right wrist pain    MASS  . Wears glasses     Patient Active Problem List   Diagnosis Date Noted  .  AKI (acute kidney injury) ([place]) 09/22/2020  . Diarrhea 09/22/2020  . Hypotension 09/22/2020  . C. difficile diarrhea 09/22/2020  . Choledocholithiasis 09/21/2020  . COPD GOLD 0/ lama/laba responsive 07/11/2019  . GERD clinical dx only  07/11/2019  . HTN (hypertension) 10/10/2017  . Imbalance 06/04/2017  . Depression 04/04/2017  . Left ankle injury, initial encounter 02/28/2017  . Encounter for therapeutic drug monitoring 10/01/2016  . DOE (dyspnea on exertion) 07/05/2016  . Pleural effusion, right 07/13/2014  . Right arm cellulitis 02/03/2014  . Cancer of right lung parenchyma ([place]) 11/12/2011    Past Surgical History:  Procedure Laterality Date  . ANAL FISSURE REPAIR  07/09/2011   INTERNAL SPHINCTEROTOMY  . BENIGN EXCISION LEFT BREAST CENTRAL DUCT  02/1999  . BREAST BIOPSY  01/31/2009   benign  . BREAST EXCISIONAL BIOPSY Left 2004   benign  . CHEST TUBE INSERTION Right 07/14/2014   Procedure: INSERTION PLEURAL DRAINAGE CATHETER RIGHT CHEST;  Surgeon: Grace Isaac, MD;  Location: [facility];  Service: Thoracic;  Laterality: Right;  . EXCISION RIGHT WRIST MASS  2012  . EXTRACORPOREAL SHOCK WAVE LITHOTRIPSY Left 06/01/2019   Procedure: EXTRACORPOREAL SHOCK WAVE LITHOTRIPSY (ESWL);  Surgeon: Cleon Gustin, MD;  Location[facility];  Service: Urology;  Laterality: Left;  .  KNEE ARTHROSCOPY Right 1999  . MASS EXCISION Right 03/11/2014   Procedure: RIGHT WRIST DEEP MASS EXCISION WITH CULTURE AND BIOSPY;  Surgeon: Linna Hoff, MD;  Location: [facility];  Service: Orthopedics;  Laterality: Right;  . REMOVAL OF PLEURAL DRAINAGE CATHETER Right 10/14/2014   Procedure: REMOVAL OF PLEURAL DRAINAGE CATHETER;  Surgeon: Grace Isaac, MD;  Location: [facility];  Service: Thoracic;  Laterality: Right;  . TALC PLEURODESIS Right 09/16/2014   Procedure: TALC PLEURADESIS/ slurry;  Surgeon: Grace Isaac, MD;  Location: [facility];  Service: Thoracic;  Laterality: Right;  . THORACOSCOPY   01-26-2009   w/   lung/  node biopsy's  . THUMB ARTHROSCOPY Left    - removed bone spur  . TONSILLECTOMY  AS CHILD  . TOTAL ABDOMINAL HYSTERECTOMY W/ BILATERAL SALPINGOOPHORECTOMY  1982   W/  APPENDECTOMY  . TRANSTHORACIC ECHOCARDIOGRAM  12-23-2008   NORMAL LV/  EF 65-70%/  MILD MR  &  TR  . VAULT SUSPENSION PLUS CYSTOCELE REPAIR WITH GRAFT  06-13-2010     OB History   No obstetric history on file.     Family History  Problem Relation Age of Onset  . Cancer Father        bladder  . Cancer Son        kidney - removed as a teenager    Social History   Tobacco Use  . Smoking status: Former Smoker    Packs/day: 1.00    Years: 30.00    Pack years: 30.00    Types: Cigarettes    Quit date: 12/24/1977    Years since quitting: 42.8  . Smokeless tobacco: Never Used  Vaping Use  . Vaping Use: Never used  Substance Use Topics  . Alcohol use: No  . Drug use: No    Home Medications Prior to Admission medications   Medication Sig Start Date End Date Taking? Authorizing Provider  amLODipine (NORVASC) 5 MG tablet Take 1 tablet (5 mg total) by mouth daily. 09/29/20   Donne Hazel, MD  augmented betamethasone dipropionate (DIPROLENE-AF) 0.05 % ointment Apply 1 application topically 2 (two) times daily as needed (dry skin).  08/22/17   [provider]  bismuth subsalicylate (PEPTO BISMOL) 262 MG/15ML suspension Take 30 mLs by mouth every three (3) days as needed.    [provider]  cholecalciferol (VITAMIN D) 1000 UNITS tablet Take 1,000 Units by mouth daily.     [provider]  Coenzyme Q10 (COQ10) 100 MG CAPS Take 1 capsule by mouth daily.     [provider]  Cyanocobalamin (B-12) 1000 MCG CAPS Take 1,000 mcg by mouth in the morning and at bedtime.     [provider]  diclofenac Sodium (VOLTAREN) 1 % GEL Apply 2 g topically 4 (four) times daily as needed (pain).    [provider]  loperamide (IMODIUM A-D) 2 MG tablet Take 1  mg by mouth 4 (four) times daily as needed for diarrhea or loose stools.     [provider]  loratadine (CLARITIN) 10 MG tablet Take 10 mg by mouth daily as needed for allergies.     [provider]  meloxicam (MOBIC) 15 MG tablet Take 15 mg by mouth daily. 04/30/18   [provider]  mirtazapine (REMERON) 15 MG tablet TAKE 1 TABLET BY MOUTH AT BEDTIME Patient taking differently: Take 15 mg by mouth at bedtime.  09/04/20   Curt Bears, MD  Polyethyl Glycol-Propyl Glycol (  SYSTANE OP) Place 1 drop into both eyes at bedtime as needed (for dry eyes.).     [provider]  Simethicone (GAS-X PO) Take 1 tablet by mouth 2 (two) times daily as needed (for gas).     [provider]  simvastatin (ZOCOR) 20 MG tablet Take 1 tablet (20 mg total) by mouth every Monday, Wednesday, and Friday. 06/13/20   Josue Hector, MD  STIOLTO RESPIMAT 2.5-2.5 MCG/ACT AERS INHALE 2 PUFFS BY MOUTH ONCE DAILY Patient taking differently: Inhale 2 puffs into the lungs daily.  08/30/20   Tanda Rockers, MD  mirtazapine (REMERON) 15 MG tablet TAKE 1 TABLET BY MOUTH AT BEDTIME 08/03/20   Curt Bears, MD    Allergies    Amoxicillin and Protonix [pantoprazole sodium]  Review of Systems   Review of Systems  Constitutional: Negative for chills and fever.  HENT: Negative for sore throat.   Eyes: Negative for visual disturbance.  Respiratory: Negative for shortness of breath.   Cardiovascular: Negative for chest pain.  Gastrointestinal: Positive for diarrhea. Negative for abdominal pain and vomiting.  Genitourinary: Negative for dysuria.  Musculoskeletal: Negative for neck pain.  Skin: Negative for rash.  Neurological: Negative for headaches.    Physical Exam Updated Vital Signs BP 130/85 (BP Location: Left Arm)   Pulse (!) 108   Temp 98 F (36.7 C) (Oral)   Resp 18   Ht 5\' 4"  (1.626 m)   Wt 49.9 kg   SpO2 100%   BMI 18.88 kg/m   Physical Exam Vitals and nursing  note reviewed.  Constitutional:      General: She is not in acute distress.    Appearance: Normal appearance. She is well-developed.  HENT:     Head: Normocephalic and atraumatic.  Eyes:     Conjunctiva/sclera: Conjunctivae normal.  Cardiovascular:     Rate and Rhythm: Regular rhythm. Tachycardia present.     Heart sounds: No murmur heard.   Pulmonary:     Effort: Pulmonary effort is normal. No respiratory distress.     Breath sounds: Normal breath sounds.  Abdominal:     Palpations: Abdomen is soft.     Tenderness: There is no abdominal tenderness. There is no guarding or rebound.  Musculoskeletal:        General: No deformity or signs of injury. Normal range of motion.     Cervical back: Neck supple.  Skin:    General: Skin is warm and dry.  Neurological:     General: No focal deficit present.     Mental Status: She is alert.     ED Results / Procedures / Treatments   Labs (all labs ordered are listed, but only abnormal results are displayed) Labs Reviewed  BASIC METABOLIC PANEL - Abnormal; Notable for the following components:      Result Value   Calcium 8.2 (*)    All other components within normal limits  CBC WITH DIFFERENTIAL/PLATELET - Abnormal; Notable for the following components:   RBC 3.11 (*)    Hemoglobin 10.1 (*)    HCT 30.5 (*)    RDW 15.9 (*)    Lymphs Abs 0.6 (*)    All other components within normal limits  MAGNESIUM - Abnormal; Notable for the following components:   Magnesium 1.3 (*)    All other components within normal limits  C DIFFICILE QUICK SCREEN W PCR REFLEX    EKG None  Radiology No results found.  Procedures Procedures (including critical care time)  Medications Ordered in ED Medications  0.9 %  sodium chloride infusion ( Intravenous Stopped 10/24/20 1435)  magnesium sulfate IVPB 2 g 50 mL (0 g Intravenous Stopped 10/24/20 1328)    ED Course  I have reviewed the triage vital signs and the nursing notes.  Pertinent labs  & imaging results that were available during my care of the patient were reviewed by me and considered in my medical decision making (see chart for details).  Clinical Course as of Oct 25 1727  Mon Oct 24, 2020  1359 Reviewed prior note of her hospitalization.  She received 10 days of dificid.    [MB]  4008 Patient was unable to provide a stool sample here.  She is asking to be discharged.  I recommended she reach out to her primary care doctor to order her an outpatient stool study.  Return instructions discussed.   [MB]    Clinical Course User Index [MB] Hayden Rasmussen, MD   MDM Rules/Calculators/A&P                         This patient complains of loose stool in the setting of recent C. difficile infection; this involves an extensive number of treatment Options and is a complaint that carries with it a high risk of complications and Morbidity. The differential includes recurrent C. difficile, malabsorption, chronic diarrhea, dehydration, metabolic derangement  I ordered, reviewed and interpreted labs, which included CBC with normal white count, hemoglobin slightly lower than baseline but not far off baseline, chemistries fairly normal other than low calcium, magnesium low I ordered medication IV fluids IV magnesium Additional history obtained from patient's husband Previous records obtained and reviewed in epic including prior admission for C. difficile  After the interventions stated above, I reevaluated the patient and found patient to be asymptomatic in the department.  She has been unable to produce a stool sample.  She is asking to be discharged and I think that is reasonable.  Recommended she contact her primary care doctor to arrange getting a stool sample sent to lab.  Return instructions discussed.   Final Clinical Impression(s) / ED Diagnoses Final diagnoses:  Diarrhea, unspecified type  Hypomagnesemia    Rx / DC Orders ED Discharge Orders    None       Hayden Rasmussen, MD 10/24/20 1730

## 2020-10-24 NOTE — ED Notes (Signed)
Per pt unable to provide stool collection at this time.

## 2020-10-27 ENCOUNTER — Inpatient Hospital Stay: Payer: Medicare Other | Admitting: Internal Medicine

## 2020-11-02 ENCOUNTER — Other Ambulatory Visit: Payer: Self-pay

## 2020-11-02 ENCOUNTER — Encounter (HOSPITAL_COMMUNITY): Payer: Self-pay

## 2020-11-02 ENCOUNTER — Ambulatory Visit (HOSPITAL_COMMUNITY): Payer: Medicare Other

## 2020-11-02 ENCOUNTER — Ambulatory Visit (HOSPITAL_COMMUNITY)
Admission: RE | Admit: 2020-11-02 | Discharge: 2020-11-02 | Disposition: A | Discharge status: Home / Self Care | Payer: Medicare Other | Source: Ambulatory Visit | Attending: Internal Medicine | Admitting: Internal Medicine

## 2020-11-02 DIAGNOSIS — C3490 Malignant neoplasm of unspecified part of unspecified bronchus or lung: Secondary | ICD-10-CM | POA: Diagnosis present

## 2020-11-02 MED ORDER — IOHEXOL 300 MG/ML  SOLN
75.0000 mL | Freq: Once | INTRAMUSCULAR | Status: AC | PRN
  Administered 2020-11-02: 75 mL via INTRAVENOUS

## 2020-11-08 ENCOUNTER — Inpatient Hospital Stay: Payer: Medicare Other | Attending: Internal Medicine | Admitting: Internal Medicine

## 2020-11-08 ENCOUNTER — Encounter: Payer: Self-pay | Admitting: Internal Medicine

## 2020-11-08 ENCOUNTER — Ambulatory Visit: Payer: Medicare Other | Admitting: Cardiology

## 2020-11-08 ENCOUNTER — Other Ambulatory Visit: Payer: Self-pay

## 2020-11-08 VITALS — BP 138/77 | HR 94 | Temp 98.4°F | Resp 18 | Wt 108.2 lb

## 2020-11-08 DIAGNOSIS — I10 Essential (primary) hypertension: Secondary | ICD-10-CM | POA: Insufficient documentation

## 2020-11-08 DIAGNOSIS — M810 Age-related osteoporosis without current pathological fracture: Secondary | ICD-10-CM | POA: Insufficient documentation

## 2020-11-08 DIAGNOSIS — Z923 Personal history of irradiation: Secondary | ICD-10-CM | POA: Insufficient documentation

## 2020-11-08 DIAGNOSIS — F329 Major depressive disorder, single episode, unspecified: Secondary | ICD-10-CM | POA: Insufficient documentation

## 2020-11-08 DIAGNOSIS — J449 Chronic obstructive pulmonary disease, unspecified: Secondary | ICD-10-CM | POA: Insufficient documentation

## 2020-11-08 DIAGNOSIS — C3491 Malignant neoplasm of unspecified part of right bronchus or lung: Secondary | ICD-10-CM

## 2020-11-08 DIAGNOSIS — Z5181 Encounter for therapeutic drug level monitoring: Secondary | ICD-10-CM

## 2020-11-08 DIAGNOSIS — K219 Gastro-esophageal reflux disease without esophagitis: Secondary | ICD-10-CM | POA: Insufficient documentation

## 2020-11-08 DIAGNOSIS — Z79899 Other long term (current) drug therapy: Secondary | ICD-10-CM | POA: Insufficient documentation

## 2020-11-08 DIAGNOSIS — E46 Unspecified protein-calorie malnutrition: Secondary | ICD-10-CM | POA: Insufficient documentation

## 2020-11-08 DIAGNOSIS — M129 Arthropathy, unspecified: Secondary | ICD-10-CM | POA: Diagnosis not present

## 2020-11-08 DIAGNOSIS — Z9221 Personal history of antineoplastic chemotherapy: Secondary | ICD-10-CM | POA: Diagnosis not present

## 2020-11-08 DIAGNOSIS — C342 Malignant neoplasm of middle lobe, bronchus or lung: Secondary | ICD-10-CM | POA: Insufficient documentation

## 2020-11-08 DIAGNOSIS — Z8719 Personal history of other diseases of the digestive system: Secondary | ICD-10-CM | POA: Insufficient documentation

## 2020-11-08 DIAGNOSIS — J90 Pleural effusion, not elsewhere classified: Secondary | ICD-10-CM | POA: Diagnosis not present

## 2020-11-08 NOTE — Progress Notes (Signed)
[facility] Telephone:([phone]) [phone]   [phone]  OFFICE PROGRESS NOTE  [name], MD [place] [address]  DIAGNOSIS: Stage IIIB (T2a, N3, M0) non-small cell lung cancer, adenocarcinoma with positive EGFR mutation diagnosed in January 2010.  PRIOR THERAPY: 1) Status post concurrent chemoradiation with weekly carboplatin and paclitaxel; last dose given March 21, 2009.  2) Tarceva at 150 mg p.o. daily, status post approximately 48 months of treatment, discontinued secondary to persistent diarrhea.  3) status post right Pleurx catheter placement for right nonmalignant pleural effusion.  CURRENT THERAPY: Tarceva 100 mg by mouth daily started 03/19/2013, status post 91 months of treatment.  INTERVAL HISTORY: Dana [name] 85 y.o. female returns to the clinic today for follow-up visit.  The patient is feeling fine today with no concerning complaints except for fatigue.  She was found unconscious on the floor at her home and was taken to the hospital and diagnosed with malnutrition and dehydration.  She also developed C. difficile during her hospitalization that was treated.  She was sent to the rehabilitation center for several weeks.  She is feeling much better now.  She denied having any current chest pain, shortness of breath, cough or hemoptysis.  She denied having any fever or chills.  She has no nausea, vomiting, diarrhea or constipation.  She has no headache or visual changes.  She continues to tolerate her treatment with Tarceva fairly well.  The patient had repeat CT scan of the chest performed recently and she is here for evaluation and discussion of her risk her results.   MEDICAL HISTORY: Past Medical History:  Diagnosis Date  . Arthritis    HANDS,  WRISTS  . COPD (chronic obstructive pulmonary disease) ([place])   . Depression 04/04/2017  . Dyspnea on exertion   . Encounter for therapeutic drug monitoring 10/01/2016  . GERD  (gastroesophageal reflux disease)   . Hemorrhoid   . History of anal fissures   . History of lung cancer ONCOLOGIST--  DR [facility]--  LAST CT ,  NO RECURRENCE OR METS   DX JAN 2010 --  STAGE IIIA  NON-SMALL CELL ADENOCARCINOMA (RIGHT MIDDLE LOBE)---  S/P  CHEMORADIATIO THERAPY  (COMPLETE 03-21-2009)  . HTN (hypertension) 10/10/2017  . Imbalance 06/04/2017  . lung ca dx'd 2010  . Normal cardiac stress test    2007  PER PT  . Osteoporosis   . Rash, skin    RIGHT FOREARM/ HAND  . Right wrist pain    MASS  . Wears glasses     ALLERGIES:  is allergic to amoxicillin and protonix [pantoprazole sodium].  MEDICATIONS:  Current Outpatient Medications  Medication Sig Dispense Refill  . augmented betamethasone dipropionate (DIPROLENE-AF) 0.05 % ointment Apply 1 application topically 2 (two) times daily as needed (dry skin).     . bismuth subsalicylate (PEPTO BISMOL) 262 MG/15ML suspension Take 30 mLs by mouth every three (3) days as needed.    . cholecalciferol (VITAMIN D) 1000 UNITS tablet Take 1,000 Units by mouth daily.     . Coenzyme Q10 (COQ10) 100 MG CAPS Take 1 capsule by mouth daily.     . Cyanocobalamin (B-12) 1000 MCG CAPS Take 1,000 mcg by mouth in the morning and at bedtime.     . diclofenac Sodium (VOLTAREN) 1 % GEL Apply 2 g topically 4 (four) times daily as needed (pain).    Marland Kitchen loperamide (IMODIUM A-D) 2 MG tablet Take 1 mg by mouth  4 (four) times daily as needed for diarrhea or loose stools.     Marland Kitchen loratadine (CLARITIN) 10 MG tablet Take 10 mg by mouth daily as needed for allergies.     . meloxicam (MOBIC) 15 MG tablet Take 15 mg by mouth daily.  4  . mirtazapine (REMERON) 15 MG tablet TAKE 1 TABLET BY MOUTH AT BEDTIME (Patient taking differently: Take 15 mg by mouth at bedtime. ) 30 tablet 0  . Polyethyl Glycol-Propyl Glycol (SYSTANE OP) Place 1 drop into both eyes at bedtime as needed (for dry eyes.).     Marland Kitchen Simethicone (GAS-X PO) Take 1 tablet by mouth 2 (two) times daily as  needed (for gas).     . simvastatin (ZOCOR) 20 MG tablet Take 1 tablet (20 mg total) by mouth every Monday, Wednesday, and Friday. 39 tablet 3  . STIOLTO RESPIMAT 2.5-2.5 MCG/ACT AERS INHALE 2 PUFFS BY MOUTH ONCE DAILY (Patient taking differently: Inhale 2 puffs into the lungs daily. ) 4 g 6   No current facility-administered medications for this visit.    SURGICAL HISTORY:  Past Surgical History:  Procedure Laterality Date  . ANAL FISSURE REPAIR  07/09/2011   INTERNAL SPHINCTEROTOMY  . BENIGN EXCISION LEFT BREAST CENTRAL DUCT  02/1999  . BREAST BIOPSY  01/31/2009   benign  . BREAST EXCISIONAL BIOPSY Left 2004   benign  . CHEST TUBE INSERTION Right 07/14/2014   Procedure: INSERTION PLEURAL DRAINAGE CATHETER RIGHT CHEST;  Surgeon: Grace Isaac, MD;  Location: [facility];  Service: Thoracic;  Laterality: Right;  . EXCISION RIGHT WRIST MASS  2012  . EXTRACORPOREAL SHOCK WAVE LITHOTRIPSY Left 06/01/2019   Procedure: EXTRACORPOREAL SHOCK WAVE LITHOTRIPSY (ESWL);  Surgeon: Cleon Gustin, MD;  [facility];  Service: Urology;  Laterality: Left;  . KNEE ARTHROSCOPY Right 1999  . MASS EXCISION Right 03/11/2014   Procedure: RIGHT WRIST DEEP MASS EXCISION WITH CULTURE AND BIOSPY;  Surgeon: Linna Hoff, MD;  Location: [facility];  Service: Orthopedics;  Laterality: Right;  . REMOVAL OF PLEURAL DRAINAGE CATHETER Right 10/14/2014   Procedure: REMOVAL OF PLEURAL DRAINAGE CATHETER;  Surgeon: Grace Isaac, MD;  Location: [facility];  Service: Thoracic;  Laterality: Right;  . TALC PLEURODESIS Right 09/16/2014   Procedure: TALC PLEURADESIS/ slurry;  Surgeon: Grace Isaac, MD;  Location: [facility];  Service: Thoracic;  Laterality: Right;  . THORACOSCOPY  01-26-2009   w/   lung/  node biopsy's  . THUMB ARTHROSCOPY Left    - removed bone spur  . TONSILLECTOMY  AS CHILD  . TOTAL ABDOMINAL HYSTERECTOMY W/ BILATERAL SALPINGOOPHORECTOMY  1982   W/  APPENDECTOMY  . TRANSTHORACIC  ECHOCARDIOGRAM  12-23-2008   NORMAL LV/  EF 65-70%/  MILD MR  &  TR  . VAULT SUSPENSION PLUS CYSTOCELE REPAIR WITH GRAFT  06-13-2010    REVIEW OF SYSTEMS:  Constitutional: positive for fatigue Eyes: negative Ears, nose, mouth, throat, and face: negative Respiratory: negative Cardiovascular: negative Gastrointestinal: negative Genitourinary:negative Integument/breast: negative Hematologic/lymphatic: negative Musculoskeletal:positive for muscle weakness Neurological: negative Behavioral/Psych: negative Endocrine: negative Allergic/Immunologic: negative   PHYSICAL EXAMINATION: General appearance: alert, cooperative, fatigued and no distress Head: Normocephalic, without obvious abnormality, atraumatic Neck: no adenopathy, no JVD, supple, symmetrical, trachea midline and thyroid not enlarged, symmetric, no tenderness/mass/nodules Lymph nodes: Cervical, supraclavicular, and axillary nodes normal. Resp: clear to auscultation bilaterally Back: symmetric, no curvature. ROM normal. No CVA tenderness. Cardio: regular rate and rhythm, S1, S2 normal, no murmur, click, rub  or gallop GI: soft, non-tender; bowel sounds normal; no masses,  no organomegaly Extremities: extremities normal, atraumatic, no cyanosis or edema Neurologic: Alert and oriented X 3, normal strength and tone. Normal symmetric reflexes. Normal coordination and gait  ECOG PERFORMANCE STATUS: 1 - Symptomatic but completely ambulatory  Blood pressure 138/77, pulse 94, temperature 98.4 F (36.9 C), resp. rate 18, weight 108 lb 3.2 oz (49.1 kg), SpO2 97 %.  LABORATORY DATA: Lab Results  Component Value Date   WBC 6.9 10/24/2020   HGB 10.1 (L) 10/24/2020   HCT 30.5 (L) 10/24/2020   MCV 98.1 10/24/2020   PLT 252 10/24/2020      Chemistry      Component Value Date/Time   NA 140 10/24/2020 1120   NA 142 12/10/2017 1000   K 3.6 10/24/2020 1120   K 4.0 12/10/2017 1000   CL 106 10/24/2020 1120   CL 108 (H) 06/03/2013  0939   CO2 26 10/24/2020 1120   CO2 28 12/10/2017 1000   BUN 23 10/24/2020 1120   BUN 20.4 12/10/2017 1000   CREATININE 0.85 10/24/2020 1120   CREATININE 0.95 08/24/2020 1017   CREATININE 0.9 12/10/2017 1000      Component Value Date/Time   CALCIUM 8.2 (L) 10/24/2020 1120   CALCIUM 9.7 12/10/2017 1000   ALKPHOS 39 09/28/2020 0511   ALKPHOS 63 12/10/2017 1000   AST 28 09/28/2020 0511   AST 28 08/24/2020 1017   AST 25 12/10/2017 1000   ALT 44 09/28/2020 0511   ALT 27 08/24/2020 1017   ALT 17 12/10/2017 1000   BILITOT 0.9 09/28/2020 0511   BILITOT 1.3 (H) 08/24/2020 1017   BILITOT 1.05 12/10/2017 1000       RADIOGRAPHIC STUDIES: CT Chest W Contrast  Result Date: 11/03/2020 CLINICAL DATA:  Non-small-cell lung cancer.  Restaging. EXAM: CT CHEST WITH CONTRAST TECHNIQUE: Multidetector CT imaging of the chest was performed during intravenous contrast administration. CONTRAST:  28mL OMNIPAQUE IOHEXOL 300 MG/ML  SOLN COMPARISON:  04/22/2020 FINDINGS: Cardiovascular: The heart size is normal. No substantial pericardial effusion. Coronary artery calcification is evident. Atherosclerotic calcification is noted in the wall of the thoracic aorta. Mediastinum/Nodes: Mildly enlarged 12 mm left paraesophageal node on 69/2 is stable. Circumferential wall thickening mid esophagus unchanged. Scarring again noted right hilum. There is no axillary lymphadenopathy. Lungs/Pleura: Centrilobular emphsyema noted. Volume loss in the right hemithorax is stable with right parahilar fibrosis/scarring. Pleural thickening and basilar scarring in the right hemithorax is chronic and stable. Mixed attenuation nodule in the left lung apex is stable at 1.6 x 0.9 cm. 9 mm nodular opacity identified anterior left upper lobe previously is now 10 mm.No new suspicious pulmonary nodule or mass. Upper Abdomen: Small anterior right juxta diaphragmatic lymph nodes are stable. Musculoskeletal: No worrisome lytic or sclerotic osseous  abnormality. IMPRESSION: 1. Stable exam. No new or progressive findings to suggest recurrent or metastatic disease. 2. Stable appearance of the mixed attenuation nodule in the left lung apex and 10 mm nodular consolidative opacity in the anterior left upper lobe. Continued close attention on follow-up recommended. 3. Aortic Atherosclerosis (ICD10-I70.0) and Emphysema (ICD10-J43.9). Electronically Signed   By: Kennith Center M.D.   On: 11/03/2020 08:58    ASSESSMENT AND PLAN:  This is a very pleasant 85 years old white female with stage IIIB non-small cell lung cancer, adenocarcinoma with positive EGFR mutation diagnosed in January 2010. She has been on treatment with Tarceva for more than 9 years.   She is currently  on Tarceva 100 mg p.o. daily status post 91  months. The patient continues to tolerate her treatment with Tarceva fairly well with no concerning adverse effects. She had repeat CT scan of the chest performed recently.  I personally and independently reviewed the scans and discussed the results with the patient today. Her scan showed no concerning findings for disease progression. I had a lengthy discussion with the patient today about her condition and treatment options.  I gave her the option of discontinuing her treatment with Tarceva at this point.  The patient has been on this treatment for more than 11 years and she had no evidence for disease progression.  She mentions that she has 59-month supply of Tarceva at home and she would like to finish this shipment before considering discontinuation of her treatment. I will see her back for follow-up visit in 6 weeks for evaluation and repeat blood work. If the patient has no evidence for disease progression on the upcoming scan, we may consider discontinuing her treatment with Tarceva and continuous observation. She was advised to call immediately if she has any concerning symptoms in the interval. The patient voices understanding of current  disease status and treatment options and is in agreement with the current care plan. All questions were answered. The patient knows to call the clinic with any problems, questions or concerns. We can certainly see the patient much sooner if necessary. Disclaimer: This note was dictated with voice recognition software. Similar sounding words can inadvertently be transcribed and may not be corrected upon review.

## 2020-11-10 ENCOUNTER — Telehealth: Payer: Self-pay | Admitting: Internal Medicine

## 2020-11-10 NOTE — Telephone Encounter (Signed)
Scheduled per los. Called and spoke with patient. Confirmed appt 

## 2020-12-08 ENCOUNTER — Other Ambulatory Visit: Payer: Self-pay | Admitting: Medical Oncology

## 2020-12-08 ENCOUNTER — Telehealth: Payer: Self-pay | Admitting: Medical Oncology

## 2020-12-08 NOTE — Telephone Encounter (Signed)
Tarceva refill-[facility] requesting refill for Tarceva.  Refills on hold until 12/27 when she and [name] will decide about continuing tx.

## 2020-12-11 ENCOUNTER — Other Ambulatory Visit: Payer: Self-pay | Admitting: Internal Medicine

## 2020-12-11 DIAGNOSIS — C3491 Malignant neoplasm of unspecified part of right bronchus or lung: Secondary | ICD-10-CM

## 2020-12-19 ENCOUNTER — Inpatient Hospital Stay: Payer: Medicare Other | Attending: Internal Medicine | Admitting: Internal Medicine

## 2020-12-19 ENCOUNTER — Other Ambulatory Visit: Payer: Self-pay

## 2020-12-19 ENCOUNTER — Other Ambulatory Visit: Payer: Self-pay | Admitting: Medical Oncology

## 2020-12-19 ENCOUNTER — Encounter: Payer: Self-pay | Admitting: Internal Medicine

## 2020-12-19 ENCOUNTER — Telehealth: Payer: Self-pay | Admitting: Internal Medicine

## 2020-12-19 ENCOUNTER — Inpatient Hospital Stay: Payer: Medicare Other

## 2020-12-19 VITALS — BP 164/92 | HR 94 | Temp 98.4°F | Resp 18 | Ht 64.0 in | Wt 109.1 lb

## 2020-12-19 DIAGNOSIS — Z5181 Encounter for therapeutic drug level monitoring: Secondary | ICD-10-CM | POA: Diagnosis not present

## 2020-12-19 DIAGNOSIS — M129 Arthropathy, unspecified: Secondary | ICD-10-CM | POA: Diagnosis not present

## 2020-12-19 DIAGNOSIS — J449 Chronic obstructive pulmonary disease, unspecified: Secondary | ICD-10-CM | POA: Insufficient documentation

## 2020-12-19 DIAGNOSIS — K219 Gastro-esophageal reflux disease without esophagitis: Secondary | ICD-10-CM | POA: Insufficient documentation

## 2020-12-19 DIAGNOSIS — R0602 Shortness of breath: Secondary | ICD-10-CM | POA: Diagnosis not present

## 2020-12-19 DIAGNOSIS — C3491 Malignant neoplasm of unspecified part of right bronchus or lung: Secondary | ICD-10-CM

## 2020-12-19 DIAGNOSIS — J90 Pleural effusion, not elsewhere classified: Secondary | ICD-10-CM | POA: Diagnosis not present

## 2020-12-19 DIAGNOSIS — I10 Essential (primary) hypertension: Secondary | ICD-10-CM | POA: Insufficient documentation

## 2020-12-19 DIAGNOSIS — Z79899 Other long term (current) drug therapy: Secondary | ICD-10-CM | POA: Diagnosis not present

## 2020-12-19 DIAGNOSIS — F329 Major depressive disorder, single episode, unspecified: Secondary | ICD-10-CM | POA: Diagnosis not present

## 2020-12-19 DIAGNOSIS — R5383 Other fatigue: Secondary | ICD-10-CM | POA: Insufficient documentation

## 2020-12-19 DIAGNOSIS — C342 Malignant neoplasm of middle lobe, bronchus or lung: Secondary | ICD-10-CM | POA: Insufficient documentation

## 2020-12-19 LAB — CBC WITH DIFFERENTIAL (CANCER CENTER ONLY)
Abs Immature Granulocytes: 0.02 10*3/uL (ref 0.00–0.07)
Basophils Absolute: 0 10*3/uL (ref 0.0–0.1)
Basophils Relative: 0 %
Eosinophils Absolute: 0.3 10*3/uL (ref 0.0–0.5)
Eosinophils Relative: 5 %
HCT: 37.3 % (ref 36.0–46.0)
Hemoglobin: 12.4 g/dL (ref 12.0–15.0)
Immature Granulocytes: 0 %
Lymphocytes Relative: 11 %
Lymphs Abs: 0.8 10*3/uL (ref 0.7–4.0)
MCH: 31.9 pg (ref 26.0–34.0)
MCHC: 33.2 g/dL (ref 30.0–36.0)
MCV: 95.9 fL (ref 80.0–100.0)
Monocytes Absolute: 0.6 10*3/uL (ref 0.1–1.0)
Monocytes Relative: 9 %
Neutro Abs: 5.4 10*3/uL (ref 1.7–7.7)
Neutrophils Relative %: 75 %
Platelet Count: 207 10*3/uL (ref 150–400)
RBC: 3.89 MIL/uL (ref 3.87–5.11)
RDW: 13.3 % (ref 11.5–15.5)
WBC Count: 7.2 10*3/uL (ref 4.0–10.5)
nRBC: 0 % (ref 0.0–0.2)

## 2020-12-19 LAB — CMP (CANCER CENTER ONLY)
ALT: 27 U/L (ref 0–44)
AST: 37 U/L (ref 15–41)
Albumin: 3.4 g/dL — ABNORMAL LOW (ref 3.5–5.0)
Alkaline Phosphatase: 44 U/L (ref 38–126)
Anion gap: 6 (ref 5–15)
BUN: 28 mg/dL — ABNORMAL HIGH (ref 8–23)
CO2: 30 mmol/L (ref 22–32)
Calcium: 9.7 mg/dL (ref 8.9–10.3)
Chloride: 106 mmol/L (ref 98–111)
Creatinine: 1.02 mg/dL — ABNORMAL HIGH (ref 0.44–1.00)
GFR, Estimated: 55 mL/min — ABNORMAL LOW (ref >60)
Glucose, Bld: 90 mg/dL (ref 70–99)
Potassium: 4.9 mmol/L (ref 3.5–5.1)
Sodium: 142 mmol/L (ref 135–145)
Total Bilirubin: 1 mg/dL (ref 0.3–1.2)
Total Protein: 6.7 g/dL (ref 6.5–8.1)

## 2020-12-19 NOTE — Progress Notes (Signed)
[name] [phone]   [phone]  OFFICE PROGRESS NOTE  [name], MD [place] [address]  DIAGNOSIS: Stage IIIB (T2a, N3, M0) non-small cell lung cancer, adenocarcinoma with positive EGFR mutation diagnosed in January 2010.  PRIOR THERAPY: 1) Status post concurrent chemoradiation with weekly carboplatin and paclitaxel; last dose given March 21, 2009.  2) Tarceva at 150 mg p.o. daily, status post approximately 48 months of treatment, discontinued secondary to persistent diarrhea.  3) status post right Pleurx catheter placement for right nonmalignant pleural effusion.  CURRENT THERAPY: Tarceva 100 mg by mouth daily started 03/19/2013, status post 93 months of treatment.  INTERVAL HISTORY: Dana [name] 85 y.o. female returns to the clinic today for follow-up visit accompanied by her daughter-in-law.  The patient is feeling fine today with no concerning complaints except for fatigue.  She denied having any current chest pain, shortness of breath, cough or hemoptysis.  She denied having any fever or chills.  She has no nausea, vomiting, diarrhea or constipation.  She has no headache or visual changes.  She continues to tolerate her treatment with Tarceva fairly well.  The patient is here today for evaluation and repeat blood work.  MEDICAL HISTORY: Past Medical History:  Diagnosis Date  . Arthritis    HANDS,  WRISTS  . COPD (chronic obstructive pulmonary disease) ([place])   . Depression 04/04/2017  . Dyspnea on exertion   . Encounter for therapeutic drug monitoring 10/01/2016  . GERD (gastroesophageal reflux disease)   . Hemorrhoid   . History of anal fissures   . History of lung cancer ONCOLOGIST--  DR [facility]--  LAST CT ,  NO RECURRENCE OR METS   DX JAN 2010 --  STAGE IIIA  NON-SMALL CELL ADENOCARCINOMA (RIGHT MIDDLE LOBE)---  S/P  CHEMORADIATIO THERAPY  (COMPLETE 03-21-2009)  . HTN (hypertension) 10/10/2017  .  Imbalance 06/04/2017  . lung ca dx'd 2010  . Normal cardiac stress test    2007  PER PT  . Osteoporosis   . Rash, skin    RIGHT FOREARM/ HAND  . Right wrist pain    MASS  . Wears glasses     ALLERGIES:  is allergic to clindamycin hcl, amoxicillin, and protonix [pantoprazole sodium].  MEDICATIONS:  Current Outpatient Medications  Medication Sig Dispense Refill  . augmented betamethasone dipropionate (DIPROLENE-AF) 0.05 % ointment Apply 1 application topically 2 (two) times daily as needed (dry skin).     . bismuth subsalicylate (PEPTO BISMOL) 262 MG/15ML suspension Take 30 mLs by mouth every three (3) days as needed.    . cholecalciferol (VITAMIN D) 1000 UNITS tablet Take 1,000 Units by mouth daily.     . Coenzyme Q10 (COQ10) 100 MG CAPS Take 1 capsule by mouth daily.     . Cyanocobalamin (B-12) 1000 MCG CAPS Take 1,000 mcg by mouth in the morning and at bedtime.    . diclofenac Sodium (VOLTAREN) 1 % GEL Apply 2 g topically 4 (four) times daily as needed (pain).    Marland Kitchen loperamide (IMODIUM A-D) 2 MG tablet Take 1 mg by mouth 4 (four) times daily as needed for diarrhea or loose stools.     Marland Kitchen loratadine (CLARITIN) 10 MG tablet Take 10 mg by mouth daily as needed for allergies.    . meloxicam (MOBIC) 15 MG tablet Take 15 mg by mouth daily.  4  . mirtazapine (REMERON) 15 MG tablet TAKE 1 TABLET BY MOUTH AT BEDTIME  30 tablet 0  . Polyethyl Glycol-Propyl Glycol (SYSTANE OP) Place 1 drop into both eyes at bedtime as needed (for dry eyes.).    Marland Kitchen Simethicone (GAS-X PO) Take 1 tablet by mouth 2 (two) times daily as needed (for gas).     . simvastatin (ZOCOR) 20 MG tablet Take 1 tablet (20 mg total) by mouth every Monday, Wednesday, and Friday. 39 tablet 3  . STIOLTO RESPIMAT 2.5-2.5 MCG/ACT AERS INHALE 2 PUFFS BY MOUTH ONCE DAILY (Patient taking differently: Inhale 2 puffs into the lungs daily.) 4 g 6   No current facility-administered medications for this visit.    SURGICAL HISTORY:  Past  Surgical History:  Procedure Laterality Date  . ANAL FISSURE REPAIR  07/09/2011   INTERNAL SPHINCTEROTOMY  . BENIGN EXCISION LEFT BREAST CENTRAL DUCT  02/1999  . BREAST BIOPSY  01/31/2009   benign  . BREAST EXCISIONAL BIOPSY Left 2004   benign  . CHEST TUBE INSERTION Right 07/14/2014   Procedure: INSERTION PLEURAL DRAINAGE CATHETER RIGHT CHEST;  Surgeon: Grace Isaac, MD;  Location: [facility];  Service: Thoracic;  Laterality: Right;  . EXCISION RIGHT WRIST MASS  2012  . EXTRACORPOREAL SHOCK WAVE LITHOTRIPSY Left 06/01/2019   Procedure: EXTRACORPOREAL SHOCK WAVE LITHOTRIPSY (ESWL);  Surgeon: Cleon Gustin, MD;  Location: [facility];  Service: Urology;  Laterality: Left;  . KNEE ARTHROSCOPY Right 1999  . MASS EXCISION Right 03/11/2014   Procedure: RIGHT WRIST DEEP MASS EXCISION WITH CULTURE AND BIOSPY;  Surgeon: Linna Hoff, MD;  Location: [facility];  Service: Orthopedics;  Laterality: Right;  . REMOVAL OF PLEURAL DRAINAGE CATHETER Right 10/14/2014   Procedure: REMOVAL OF PLEURAL DRAINAGE CATHETER;  Surgeon: Grace Isaac, MD;  Location: [facility];  Service: Thoracic;  Laterality: Right;  . TALC PLEURODESIS Right 09/16/2014   Procedure: TALC PLEURADESIS/ slurry;  Surgeon: Grace Isaac, MD;  Location: [facility];  Service: Thoracic;  Laterality: Right;  . THORACOSCOPY  01-26-2009   w/   lung/  node biopsy's  . THUMB ARTHROSCOPY Left    - removed bone spur  . TONSILLECTOMY  AS CHILD  . TOTAL ABDOMINAL HYSTERECTOMY W/ BILATERAL SALPINGOOPHORECTOMY  1982   W/  APPENDECTOMY  . TRANSTHORACIC ECHOCARDIOGRAM  12-23-2008   NORMAL LV/  EF 65-70%/  MILD MR  &  TR  . VAULT SUSPENSION PLUS CYSTOCELE REPAIR WITH GRAFT  06-13-2010    REVIEW OF SYSTEMS:  A comprehensive review of systems was negative except for: Constitutional: positive for fatigue   PHYSICAL EXAMINATION: General appearance: alert, cooperative, fatigued and no distress Head: Normocephalic, without obvious  abnormality, atraumatic Neck: no adenopathy, no JVD, supple, symmetrical, trachea midline and thyroid not enlarged, symmetric, no tenderness/mass/nodules Lymph nodes: Cervical, supraclavicular, and axillary nodes normal. Resp: clear to auscultation bilaterally Back: symmetric, no curvature. ROM normal. No CVA tenderness. Cardio: regular rate and rhythm, S1, S2 normal, no murmur, click, rub or gallop GI: soft, non-tender; bowel sounds normal; no masses,  no organomegaly Extremities: extremities normal, atraumatic, no cyanosis or edema  ECOG PERFORMANCE STATUS: 1 - Symptomatic but completely ambulatory  Blood pressure (!) 164/92, pulse 94, temperature 98.4 F (36.9 C), temperature source Tympanic, resp. rate 18, height $RemoveBe'5\' 4"'DryqaedFu$  (1.626 m), weight 109 lb 1.6 oz (49.5 kg), SpO2 98 %.  LABORATORY DATA: Lab Results  Component Value Date   WBC 7.2 12/19/2020   HGB 12.4 12/19/2020   HCT 37.3 12/19/2020   MCV 95.9 12/19/2020   PLT 207 12/19/2020  Chemistry      Component Value Date/Time   NA 140 10/24/2020 1120   NA 142 12/10/2017 1000   K 3.6 10/24/2020 1120   K 4.0 12/10/2017 1000   CL 106 10/24/2020 1120   CL 108 (H) 06/03/2013 0939   CO2 26 10/24/2020 1120   CO2 28 12/10/2017 1000   BUN 23 10/24/2020 1120   BUN 20.4 12/10/2017 1000   CREATININE 0.85 10/24/2020 1120   CREATININE 0.95 08/24/2020 1017   CREATININE 0.9 12/10/2017 1000      Component Value Date/Time   CALCIUM 8.2 (L) 10/24/2020 1120   CALCIUM 9.7 12/10/2017 1000   ALKPHOS 39 09/28/2020 0511   ALKPHOS 63 12/10/2017 1000   AST 28 09/28/2020 0511   AST 28 08/24/2020 1017   AST 25 12/10/2017 1000   ALT 44 09/28/2020 0511   ALT 27 08/24/2020 1017   ALT 17 12/10/2017 1000   BILITOT 0.9 09/28/2020 0511   BILITOT 1.3 (H) 08/24/2020 1017   BILITOT 1.05 12/10/2017 1000       RADIOGRAPHIC STUDIES: No results found.  ASSESSMENT AND PLAN:  This is a very pleasant 85 years old white female with stage IIIB  non-small cell lung cancer, adenocarcinoma with positive EGFR mutation diagnosed in January 2010. She has been on treatment with Tarceva for more than 9 years.   She is currently on Tarceva 100 mg p.o. daily status post 93  months. The patient continues to tolerate her treatment with Tarceva fairly well with no concerning complaints except for mild fatigue. She had repeat CBC performed earlier today that was unremarkable.  Comprehensive metabolic panel is still pending. I recommended for the patient to continue her current treatment with Tarceva with the same dose. I will see her back for follow-up visit in 6 weeks for evaluation with repeat blood work. She was advised to call immediately if she has any other concerning symptoms in the interval. The patient voices understanding of current disease status and treatment options and is in agreement with the current care plan. All questions were answered. The patient knows to call the clinic with any problems, questions or concerns. We can certainly see the patient much sooner if necessary. Disclaimer: This note was dictated with voice recognition software. Similar sounding words can inadvertently be transcribed and may not be corrected upon review.

## 2020-12-19 NOTE — Telephone Encounter (Signed)
Scheduled appointments per 12/27 los. Spoke to patient who is aware of appointment date and time. Gave patient calendar print out.

## 2020-12-27 DIAGNOSIS — J449 Chronic obstructive pulmonary disease, unspecified: Secondary | ICD-10-CM | POA: Diagnosis not present

## 2020-12-27 DIAGNOSIS — K219 Gastro-esophageal reflux disease without esophagitis: Secondary | ICD-10-CM | POA: Diagnosis not present

## 2020-12-27 DIAGNOSIS — E440 Moderate protein-calorie malnutrition: Secondary | ICD-10-CM | POA: Diagnosis not present

## 2020-12-27 DIAGNOSIS — K602 Anal fissure, unspecified: Secondary | ICD-10-CM | POA: Diagnosis not present

## 2020-12-27 DIAGNOSIS — C3490 Malignant neoplasm of unspecified part of unspecified bronchus or lung: Secondary | ICD-10-CM | POA: Diagnosis not present

## 2020-12-27 DIAGNOSIS — Z87891 Personal history of nicotine dependence: Secondary | ICD-10-CM | POA: Diagnosis not present

## 2020-12-27 DIAGNOSIS — I10 Essential (primary) hypertension: Secondary | ICD-10-CM | POA: Diagnosis not present

## 2020-12-27 DIAGNOSIS — K838 Other specified diseases of biliary tract: Secondary | ICD-10-CM | POA: Diagnosis not present

## 2020-12-27 DIAGNOSIS — M810 Age-related osteoporosis without current pathological fracture: Secondary | ICD-10-CM | POA: Diagnosis not present

## 2020-12-27 DIAGNOSIS — Z9181 History of falling: Secondary | ICD-10-CM | POA: Diagnosis not present

## 2020-12-27 DIAGNOSIS — Z7951 Long term (current) use of inhaled steroids: Secondary | ICD-10-CM | POA: Diagnosis not present

## 2020-12-27 DIAGNOSIS — L988 Other specified disorders of the skin and subcutaneous tissue: Secondary | ICD-10-CM | POA: Diagnosis not present

## 2020-12-27 DIAGNOSIS — M1990 Unspecified osteoarthritis, unspecified site: Secondary | ICD-10-CM | POA: Diagnosis not present

## 2020-12-28 ENCOUNTER — Ambulatory Visit: Payer: Medicare Other | Attending: Otolaryngology

## 2020-12-28 ENCOUNTER — Other Ambulatory Visit: Payer: Self-pay

## 2020-12-28 DIAGNOSIS — R1310 Dysphagia, unspecified: Secondary | ICD-10-CM | POA: Insufficient documentation

## 2020-12-28 DIAGNOSIS — R490 Dysphonia: Secondary | ICD-10-CM | POA: Diagnosis not present

## 2020-12-28 NOTE — Therapy (Signed)
[facility] [address] [place], [place], [address] Phone: [phone]   Fax:  [phone]  Speech Language Pathology Evaluation  Patient Details  Name: Dana [name] MRN: [id number] Date of Birth: 11-May-1935 Referring Provider (SLP): Izora Gala, MD   Encounter Date: 12/28/2020   End of Session - 12/28/20 1733    Visit Number 1    Number of Visits 13    Date for SLP Re-Evaluation 03/28/21    SLP Start Time 1106    SLP Stop Time  1202    SLP Time Calculation (min) 56 min    Activity Tolerance Patient tolerated treatment well           Past Medical History:  Diagnosis Date  . Arthritis    HANDS,  WRISTS  . COPD (chronic obstructive pulmonary disease) ([place])   . Depression 04/04/2017  . Dyspnea on exertion   . Encounter for therapeutic drug monitoring 10/01/2016  . GERD (gastroesophageal reflux disease)   . Hemorrhoid   . History of anal fissures   . History of lung cancer ONCOLOGIST--  DR [facility]--  LAST CT ,  NO RECURRENCE OR METS   DX JAN 2010 --  STAGE IIIA  NON-SMALL CELL ADENOCARCINOMA (RIGHT MIDDLE LOBE)---  S/P  CHEMORADIATIO THERAPY  (COMPLETE 03-21-2009)  . HTN (hypertension) 10/10/2017  . Imbalance 06/04/2017  . lung ca dx'd 2010  . Normal cardiac stress test    2007  PER PT  . Osteoporosis   . Rash, skin    RIGHT FOREARM/ HAND  . Right wrist pain    MASS  . Wears glasses     Past Surgical History:  Procedure Laterality Date  . ANAL FISSURE REPAIR  07/09/2011   INTERNAL SPHINCTEROTOMY  . BENIGN EXCISION LEFT BREAST CENTRAL DUCT  02/1999  . BREAST BIOPSY  01/31/2009   benign  . BREAST EXCISIONAL BIOPSY Left 2004   benign  . CHEST TUBE INSERTION Right 07/14/2014   Procedure: INSERTION PLEURAL DRAINAGE CATHETER RIGHT CHEST;  Surgeon: Grace Isaac, MD;  Location: [facility];  Service: Thoracic;  Laterality: Right;  . EXCISION RIGHT WRIST MASS  2012  . EXTRACORPOREAL SHOCK WAVE LITHOTRIPSY  Left 06/01/2019   Procedure: EXTRACORPOREAL SHOCK WAVE LITHOTRIPSY (ESWL);  Surgeon: Cleon Gustin, MD;  [facility];  Service: Urology;  Laterality: Left;  . KNEE ARTHROSCOPY Right 1999  . MASS EXCISION Right 03/11/2014   Procedure: RIGHT WRIST DEEP MASS EXCISION WITH CULTURE AND BIOSPY;  Surgeon: Linna Hoff, MD;  Location: [facility];  Service: Orthopedics;  Laterality: Right;  . REMOVAL OF PLEURAL DRAINAGE CATHETER Right 10/14/2014   Procedure: REMOVAL OF PLEURAL DRAINAGE CATHETER;  Surgeon: Grace Isaac, MD;  Location: [facility];  Service: Thoracic;  Laterality: Right;  . TALC PLEURODESIS Right 09/16/2014   Procedure: TALC PLEURADESIS/ slurry;  Surgeon: Grace Isaac, MD;  Location: [facility];  Service: Thoracic;  Laterality: Right;  . THORACOSCOPY  01-26-2009   w/   lung/  node biopsy's  . THUMB ARTHROSCOPY Left    - removed bone spur  . TONSILLECTOMY  AS CHILD  . TOTAL ABDOMINAL HYSTERECTOMY W/ BILATERAL SALPINGOOPHORECTOMY  1982   W/  APPENDECTOMY  . TRANSTHORACIC ECHOCARDIOGRAM  12-23-2008   NORMAL LV/  EF 65-70%/  MILD MR  &  TR  . VAULT SUSPENSION PLUS CYSTOCELE REPAIR WITH GRAFT  06-13-2010    There were no vitals filed for this visit.   Subjective  Assessment - 12/28/20 1534    Subjective Pt enters with diplophonia, hoarse voice.    Patient is accompained by: Family member   Jenny Reichmann - husband   Currently in Pain? No/denies              SLP Evaluation [facility] - 12/28/20 1733      SLP Visit Information   SLP Received On 12/28/20    Referring Provider (SLP) Izora Gala, MD    Onset Date 10-15-20    Medical Diagnosis vocal fold paresis      Subjective   Patient/Family Stated Goal Improve vocal function      General Information   HPI Pt was in WNL state of health when she tells SLP she woke on 10-15-20 with mild hoarseness rated 2/10 (on scale where 1=minimal hoarseness, 10=maximum hoarseness) that has progressed to mod-severe hoarseness  (8/10 on same scale). Progression was rated 5/10 (same scale) first part of November and progressed to 8/10 by Thanksgiving and remains 8/10 to this day. PMH: lung cancer approx 10 years ago, Pt saw Dr. 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 I would like to have her evaluated with a modified barium swallow. I will talk to her oncologist about reevaluating for possible progression of the disease. We will discuss possible intervention following that."      Balance Screen   Has the patient fallen in the past 6 months No      Prior Functional Status   Cognitive/Linguistic Baseline Within functional limits    Type of Home House     Lives With Spouse    Available Support Family    Vocation Retired      Oral Motor/Sensory Function   Overall Oral Motor/Sensory Function Appears within functional limits for tasks assessed      Motor Speech   Phonation Hoarse;Breathy   average sustained /a/=1.98 seconds (significantly below WNL), pitch range varied and difficult to ascertain due to diplophonia, but appeared to be between 250 and 300Hz . The second tone possibly between 160-180Hz , however this was difficult to measure.   Intelligibility Intelligible    Effective Techniques --   speaking in higher pitch (approx 400 Hz) completely eliminated hoarseness, and pt able to sustain /a/ at this frequency at average 4.82 seconds.   Phonation Impaired                           SLP Education - 12/28/20 1732    Education Details what vocal fold paresis is, possible goals for tx, need to do swallow test (possible FEES)    Person(s) Educated Patient;Spouse    Methods Explanation    Comprehension Verbalized understanding              SLP Long Term Goals - 12/28/20 1744      SLP LONG TERM GOAL #1   Title with POs in session, pt will follow any  swallowing recommendations from objective swallow eval in 2 sessions    Time 8    Period Weeks   or 9 total sessions for all LTGs   Status New      SLP LONG TERM GOAL #2   Title pt will demonstrate any voice HEP with modified independence in 2 sessions    Time 8    Period Weeks    Status New  SLP LONG TERM GOAL #3   Title pt will increase VR-QOL score to 60 (fair to good)    Time 8    Period Weeks    Status New      SLP LONG TERM GOAL #4   Title pt will undergo objective swallow assessment    Time 2    Period Weeks    Status New            Plan - 12/28/20 1733    Clinical Impression Statement Pt presents today with max hoarse/breathy voice, with diplophonia present. Voice related QOL (V-RQOL) score is 15 ("poor"). Pt also endorses coughing with water and difficulty with pharyngeal clearance with uncoated medication. 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 SLP questions recurrent laryngeal nerve or superior laryngeal nerve involvement. Pt could vary pitch - static pitch - up and down but had difficulty with pitch manipulation in pitch glides. Pt's vocal quality fluctuated during eval, from diplophonic at lower frequencies to a more consistent clear vocal quality at a slightly higher than WNL frequency. This is typical, pt states. Pt endorses being short of breath frequently when speaking. Interestingly, when pt vocalized at pitch approx 400Hz , her voice became much more clear and had WNL quality. Voice sounded practically the same (largely diplophonic) when she vocalized at lower frequencies.    Speech Therapy Frequency 1x /week   SLP recommended x2/week to start but pt prefers x1/week due to financial reasons   Duration 8 weeks   or 9 total visits    Treatment/Interventions Aspiration precaution training;Pharyngeal strengthening exercises;Diet toleration management by SLP;Compensatory techniques;SLP instruction and feedback;Multimodal communcation approach;Patient/family education;Environmental controls   voice HEP   Potential to Achieve Goals Good    Consulted and Agree with Plan of Care Patient           Patient will benefit from skilled therapeutic intervention in order to improve the following deficits and impairments:   Voice hoarseness  Dysphagia, unspecified type    Problem List Patient Active Problem List   Diagnosis Date Noted  . AKI (acute kidney injury) ([facility]) 09/22/2020  . Diarrhea 09/22/2020  . Hypotension 09/22/2020  . C. difficile diarrhea 09/22/2020  . Choledocholithiasis 09/21/2020  . COPD GOLD 0/ lama/laba responsive 07/11/2019  . GERD clinical dx only  07/11/2019  . HTN (hypertension) 10/10/2017  . Imbalance 06/04/2017  . Depression 04/04/2017  . Left ankle injury, initial encounter 02/28/2017  . Encounter for therapeutic drug monitoring 10/01/2016  . DOE (dyspnea on exertion) 07/05/2016  . Pleural effusion, right 07/13/2014  . Right arm cellulitis 02/03/2014  . Cancer of right lung parenchyma ([place]) 11/12/2011    Khup Sapia ,[place], CCC-SLP  12/28/2020, 5:55 PM  [facility] [address] [place], [place], [address] Phone: [phone]   Fax:  [phone]  Name: Dana [name] MRN: [id number] Date of Birth: [birth year]

## 2020-12-28 NOTE — Therapy (Deleted)
[facility] [address] [place], [place], [address] Phone: [phone]   Fax:  [phone]  Speech Language Pathology Evaluation  Patient Details  Name: Dana [name] MRN: [id number] Date of Birth: [birth year] Referring Provider (SLP): Izora Gala, MD   Encounter Date: 12/28/2020   End of Session - 12/28/20 1733    Visit Number 1    Number of Visits 13    Date for SLP Re-Evaluation 02/24/21           Past Medical History:  Diagnosis Date  . Arthritis    HANDS,  WRISTS  . COPD (chronic obstructive pulmonary disease) ([place])   . Depression 04/04/2017  . Dyspnea on exertion   . Encounter for therapeutic drug monitoring 10/01/2016  . GERD (gastroesophageal reflux disease)   . Hemorrhoid   . History of anal fissures   . History of lung cancer ONCOLOGIST--  DR [facility]--  LAST CT ,  NO RECURRENCE OR METS   DX JAN 2010 --  STAGE IIIA  NON-SMALL CELL ADENOCARCINOMA (RIGHT MIDDLE LOBE)---  S/P  CHEMORADIATIO THERAPY  (COMPLETE 03-21-2009)  . HTN (hypertension) 10/10/2017  . Imbalance 06/04/2017  . lung ca dx'd 2010  . Normal cardiac stress test    2007  PER PT  . Osteoporosis   . Rash, skin    RIGHT FOREARM/ HAND  . Right wrist pain    MASS  . Wears glasses     Past Surgical History:  Procedure Laterality Date  . ANAL FISSURE REPAIR  07/09/2011   INTERNAL SPHINCTEROTOMY  . BENIGN EXCISION LEFT BREAST CENTRAL DUCT  02/1999  . BREAST BIOPSY  01/31/2009   benign  . BREAST EXCISIONAL BIOPSY Left 2004   benign  . CHEST TUBE INSERTION Right 07/14/2014   Procedure: INSERTION PLEURAL DRAINAGE CATHETER RIGHT CHEST;  Surgeon: Grace Isaac, MD;  Location: [facility];  Service: Thoracic;  Laterality: Right;  . EXCISION RIGHT WRIST MASS  2012  . EXTRACORPOREAL SHOCK WAVE LITHOTRIPSY Left 06/01/2019   Procedure: EXTRACORPOREAL SHOCK WAVE LITHOTRIPSY (ESWL);  Surgeon: Cleon Gustin, MD;  Location: [facility];  Service:  Urology;  Laterality: Left;  . KNEE ARTHROSCOPY Right 1999  . MASS EXCISION Right 03/11/2014   Procedure: RIGHT WRIST DEEP MASS EXCISION WITH CULTURE AND BIOSPY;  Surgeon: Linna Hoff, MD;  Location: [facility];  Service: Orthopedics;  Laterality: Right;  . REMOVAL OF PLEURAL DRAINAGE CATHETER Right 10/14/2014   Procedure: REMOVAL OF PLEURAL DRAINAGE CATHETER;  Surgeon: Grace Isaac, MD;  Location: [facility];  Service: Thoracic;  Laterality: Right;  . TALC PLEURODESIS Right 09/16/2014   Procedure: TALC PLEURADESIS/ slurry;  Surgeon: Grace Isaac, MD;  Location: [facility];  Service: Thoracic;  Laterality: Right;  . THORACOSCOPY  01-26-2009   w/   lung/  node biopsy's  . THUMB ARTHROSCOPY Left    - removed bone spur  . TONSILLECTOMY  AS CHILD  . TOTAL ABDOMINAL HYSTERECTOMY W/ BILATERAL SALPINGOOPHORECTOMY  1982   W/  APPENDECTOMY  . TRANSTHORACIC ECHOCARDIOGRAM  12-23-2008   NORMAL LV/  EF 65-70%/  MILD MR  &  TR  . VAULT SUSPENSION PLUS CYSTOCELE REPAIR WITH GRAFT  06-13-2010    There were no vitals filed for this visit.   Subjective Assessment - 12/28/20 1534    Subjective Pt enters with diplophonia, hoarse voice.    Patient is accompained by: Family member   Jenny Reichmann - husband   Currently in Pain?  No/denies              SLP Evaluation [facility] - 12/28/20 1534      SLP Visit Information   SLP Received On 12/28/20    Referring Provider (SLP) Izora Gala, MD    Onset Date 10-15-20    Medical Diagnosis vocal fold paresis      Subjective   Patient/Family Stated Goal Improve vocal function      General Information   HPI Pt was in WNL state of health when she tells SLP she woke on 10-15-20 with mild hoarseness rated 2/10 (on scale where 1=minimal hoarseness, 10=maximum hoarseness) that has progressed to mod-severe hoarseness (8/10 on same scale). Progression was rated 5/10 (same scale) first part of November and progressed to 8/10 by Thanksgiving and remains 8/10  to this day. PMH: lung cancer approx 10 years ago, Pt saw Dr. 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 I would like to have her evaluated with a modified barium swallow. I will talk to her oncologist about reevaluating for possible progression of the disease. We will discuss possible intervention following that."                            SLP Education - 12/28/20 1732    Education Details what vocal fold paresis is, possible goals for tx, need to do swallow test (possible FEES)    Person(s) Educated Patient;Spouse    Methods Explanation    Comprehension Verbalized understanding              SLP Long Term Goals - 12/28/20 1744      SLP LONG TERM GOAL #1   Title with POs in session, pt will follow any swallowing recommendations from objective swallow eval in 2 sessions    Time 8    Period Weeks   or 9 total sessions for all LTGs   Status New      SLP LONG TERM GOAL #2   Title pt will demonstrate any voice HEP with modified independence in 2 sessions    Time 8    Period Weeks    Status New      SLP LONG TERM GOAL #3   Title pt will increase VR-QOL score to 60 (fair to good)    Time 8    Period Weeks    Status New      SLP LONG TERM GOAL #4   Title pt will undergo objective swallow assessment    Time 2    Period Weeks    Status New            Plan - 12/28/20 1733    Clinical Impression Statement Pt presents today with max hoarse/breathy voice, with diplophonia present. Voice related QOL (V-RQOL) score is 15 ("poor"). Pt also endorses coughing with water and difficulty with pharyngeal clearance with uncoated medication. 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 SLP questions recurrent laryngeal nerve or superior laryngeal nerve involvement. Pt could vary pitch - static pitch - up and down but had difficulty with pitch manipulation in pitch glides. Pt's vocal quality fluctuated during eval, from diplophonic  at lower frequencies to a more consistent clear vocal quality at a slightly higher than WNL frequency. This is typical, pt states. Pt endorses being short of breath frequently when speaking. Interestingly, when pt vocalized at pitch approx 400Hz , her voice became much more clear and had WNL quality. Voice sounded practically the same (largely diplophonic) when she vocalized at lower frequencies.    Speech Therapy Frequency 1x /week   SLP recommended x2/week to start but pt prefers x1/week due to financial reasons   Duration 8 weeks   or 9 total visits   Treatment/Interventions Aspiration precaution training;Pharyngeal strengthening exercises;Diet toleration management by SLP;Compensatory techniques;SLP instruction and feedback;Multimodal communcation approach;Patient/family education;Environmental controls   voice HEP   Potential to Achieve Goals Good    Consulted and Agree with Plan of Care Patient           Patient will benefit from skilled therapeutic intervention in order to improve the following deficits and impairments:   Voice hoarseness  Dysphagia, unspecified type    Problem List Patient Active Problem List   Diagnosis Date Noted  . AKI (acute kidney injury) ([place]) 09/22/2020  . Diarrhea 09/22/2020  . Hypotension 09/22/2020  . C. difficile diarrhea 09/22/2020  . Choledocholithiasis 09/21/2020  . COPD GOLD 0/ lama/laba responsive 07/11/2019  . GERD clinical dx only  07/11/2019  . HTN (hypertension) 10/10/2017  . Imbalance 06/04/2017  . Depression 04/04/2017  . Left ankle injury, initial encounter 02/28/2017  . Encounter for therapeutic drug monitoring 10/01/2016  . DOE  (dyspnea on exertion) 07/05/2016  . Pleural effusion, right 07/13/2014  . Right arm cellulitis 02/03/2014  . Cancer of right lung parenchyma ([place]) 11/12/2011    [facility] 12/28/2020, 5:50 PM  [facility] [address] [place], [place], [address] Phone: [phone]   Fax:  [phone]  Name: Dana [name] MRN: [id number] Date of Birth: 10-24-1935

## 2020-12-28 NOTE — Patient Instructions (Signed)
   We have some information about your voice box - we will try some things In some voice therapy to see if anything works to make your voice better!  I will recommend a FEES (fiberendoscoptic evaluation of swallowing) instead of the swallowing test Dr. Constance Holster ordered. A FEES will also look at your vocal cords whereas the other test will not.  Try drinking a little thicker liquids (think tomato juice or buttermilk thickness), or you can be more mindful of having smaller sips or using the straw - in order to minimize choking.

## 2021-01-03 DIAGNOSIS — Z7951 Long term (current) use of inhaled steroids: Secondary | ICD-10-CM | POA: Diagnosis not present

## 2021-01-03 DIAGNOSIS — M1990 Unspecified osteoarthritis, unspecified site: Secondary | ICD-10-CM | POA: Diagnosis not present

## 2021-01-03 DIAGNOSIS — J449 Chronic obstructive pulmonary disease, unspecified: Secondary | ICD-10-CM | POA: Diagnosis not present

## 2021-01-03 DIAGNOSIS — Z87891 Personal history of nicotine dependence: Secondary | ICD-10-CM | POA: Diagnosis not present

## 2021-01-03 DIAGNOSIS — K602 Anal fissure, unspecified: Secondary | ICD-10-CM | POA: Diagnosis not present

## 2021-01-03 DIAGNOSIS — K219 Gastro-esophageal reflux disease without esophagitis: Secondary | ICD-10-CM | POA: Diagnosis not present

## 2021-01-03 DIAGNOSIS — Z9181 History of falling: Secondary | ICD-10-CM | POA: Diagnosis not present

## 2021-01-03 DIAGNOSIS — C3490 Malignant neoplasm of unspecified part of unspecified bronchus or lung: Secondary | ICD-10-CM | POA: Diagnosis not present

## 2021-01-03 DIAGNOSIS — M810 Age-related osteoporosis without current pathological fracture: Secondary | ICD-10-CM | POA: Diagnosis not present

## 2021-01-03 DIAGNOSIS — K838 Other specified diseases of biliary tract: Secondary | ICD-10-CM | POA: Diagnosis not present

## 2021-01-03 DIAGNOSIS — L988 Other specified disorders of the skin and subcutaneous tissue: Secondary | ICD-10-CM | POA: Diagnosis not present

## 2021-01-03 DIAGNOSIS — E440 Moderate protein-calorie malnutrition: Secondary | ICD-10-CM | POA: Diagnosis not present

## 2021-01-03 DIAGNOSIS — I10 Essential (primary) hypertension: Secondary | ICD-10-CM | POA: Diagnosis not present

## 2021-01-10 DIAGNOSIS — I10 Essential (primary) hypertension: Secondary | ICD-10-CM | POA: Diagnosis not present

## 2021-01-10 DIAGNOSIS — Z9181 History of falling: Secondary | ICD-10-CM | POA: Diagnosis not present

## 2021-01-10 DIAGNOSIS — L988 Other specified disorders of the skin and subcutaneous tissue: Secondary | ICD-10-CM | POA: Diagnosis not present

## 2021-01-10 DIAGNOSIS — M810 Age-related osteoporosis without current pathological fracture: Secondary | ICD-10-CM | POA: Diagnosis not present

## 2021-01-10 DIAGNOSIS — E440 Moderate protein-calorie malnutrition: Secondary | ICD-10-CM | POA: Diagnosis not present

## 2021-01-10 DIAGNOSIS — M1990 Unspecified osteoarthritis, unspecified site: Secondary | ICD-10-CM | POA: Diagnosis not present

## 2021-01-10 DIAGNOSIS — K602 Anal fissure, unspecified: Secondary | ICD-10-CM | POA: Diagnosis not present

## 2021-01-10 DIAGNOSIS — J449 Chronic obstructive pulmonary disease, unspecified: Secondary | ICD-10-CM | POA: Diagnosis not present

## 2021-01-10 DIAGNOSIS — Z7951 Long term (current) use of inhaled steroids: Secondary | ICD-10-CM | POA: Diagnosis not present

## 2021-01-10 DIAGNOSIS — C3490 Malignant neoplasm of unspecified part of unspecified bronchus or lung: Secondary | ICD-10-CM | POA: Diagnosis not present

## 2021-01-10 DIAGNOSIS — K219 Gastro-esophageal reflux disease without esophagitis: Secondary | ICD-10-CM | POA: Diagnosis not present

## 2021-01-10 DIAGNOSIS — Z87891 Personal history of nicotine dependence: Secondary | ICD-10-CM | POA: Diagnosis not present

## 2021-01-10 DIAGNOSIS — K838 Other specified diseases of biliary tract: Secondary | ICD-10-CM | POA: Diagnosis not present

## 2021-01-19 ENCOUNTER — Ambulatory Visit: Payer: Medicare Other | Admitting: Cardiovascular Disease

## 2021-01-26 ENCOUNTER — Ambulatory Visit (HOSPITAL_COMMUNITY)
Admission: RE | Admit: 2021-01-26 | Discharge: 2021-01-26 | Disposition: A | Discharge status: Home / Self Care | Payer: Medicare Other | Source: Ambulatory Visit | Attending: Otolaryngology | Admitting: Otolaryngology

## 2021-01-26 ENCOUNTER — Other Ambulatory Visit: Payer: Self-pay

## 2021-01-26 DIAGNOSIS — R1310 Dysphagia, unspecified: Secondary | ICD-10-CM | POA: Insufficient documentation

## 2021-01-26 DIAGNOSIS — R490 Dysphonia: Secondary | ICD-10-CM | POA: Insufficient documentation

## 2021-01-26 NOTE — Progress Notes (Signed)
Objective Swallowing Evaluation: Type of Study: FEES-Fiberoptic Endoscopic Evaluation of Swallow   Patient Details  Name: Dana [name] MRN: [id number] Date of Birth: [birth year]  Today's Date: 01/26/2021 Time: SLP Start Time (ACUTE ONLY): 1016 -SLP Stop Time (ACUTE ONLY): 1104  SLP Time Calculation (min) (ACUTE ONLY): 48 min   Past Medical History:  Past Medical History:  Diagnosis Date  . Arthritis    HANDS,  WRISTS  . COPD (chronic obstructive pulmonary disease) ([place])   . Depression 04/04/2017  . Dyspnea on exertion   . Encounter for therapeutic drug monitoring 10/01/2016  . GERD (gastroesophageal reflux disease)   . Hemorrhoid   . History of anal fissures   . History of lung cancer ONCOLOGIST--  DR [facility]--  LAST CT ,  NO RECURRENCE OR METS   DX JAN 2010 --  STAGE IIIA  NON-SMALL CELL ADENOCARCINOMA (RIGHT MIDDLE LOBE)---  S/P  CHEMORADIATIO THERAPY  (COMPLETE 03-21-2009)  . HTN (hypertension) 10/10/2017  . Imbalance 06/04/2017  . lung ca dx'd 2010  . Normal cardiac stress test    2007  PER PT  . Osteoporosis   . Rash, skin    RIGHT FOREARM/ HAND  . Right wrist pain    MASS  . Wears glasses    Past Surgical History:  Past Surgical History:  Procedure Laterality Date  . ANAL FISSURE REPAIR  07/09/2011   INTERNAL SPHINCTEROTOMY  . BENIGN EXCISION LEFT BREAST CENTRAL DUCT  02/1999  . BREAST BIOPSY  01/31/2009   benign  . BREAST EXCISIONAL BIOPSY Left 2004   benign  . CHEST TUBE INSERTION Right 07/14/2014   Procedure: INSERTION PLEURAL DRAINAGE CATHETER RIGHT CHEST;  Surgeon: Grace Isaac, MD;  Location: [facility];  Service: Thoracic;  Laterality: Right;  . EXCISION RIGHT WRIST MASS  2012  . EXTRACORPOREAL SHOCK WAVE LITHOTRIPSY Left 06/01/2019   Procedure: EXTRACORPOREAL SHOCK WAVE LITHOTRIPSY (ESWL);  Surgeon: Cleon Gustin, MD;  Location[facility];  Service: Urology;  Laterality: Left;  . KNEE ARTHROSCOPY Right 1999  . MASS EXCISION Right 03/11/2014   Procedure:  RIGHT WRIST DEEP MASS EXCISION WITH CULTURE AND BIOSPY;  Surgeon: Linna Hoff, MD;  Location: [facility];  Service: Orthopedics;  Laterality: Right;  . REMOVAL OF PLEURAL DRAINAGE CATHETER Right 10/14/2014   Procedure: REMOVAL OF PLEURAL DRAINAGE CATHETER;  Surgeon: Grace Isaac, MD;  Location: [facility];  Service: Thoracic;  Laterality: Right;  . TALC PLEURODESIS Right 09/16/2014   Procedure: TALC PLEURADESIS/ slurry;  Surgeon: Grace Isaac, MD;  Location: [facility];  Service: Thoracic;  Laterality: Right;  . THORACOSCOPY  01-26-2009   w/   lung/  node biopsy's  . THUMB ARTHROSCOPY Left    - removed bone spur  . TONSILLECTOMY  AS CHILD  . TOTAL ABDOMINAL HYSTERECTOMY W/ BILATERAL SALPINGOOPHORECTOMY  1982   W/  APPENDECTOMY  . TRANSTHORACIC ECHOCARDIOGRAM  12-23-2008   NORMAL LV/  EF 65-70%/  MILD MR  &  TR  . VAULT SUSPENSION PLUS CYSTOCELE REPAIR WITH GRAFT  06-13-2010   HPI: 86 yr old seen for outpatient FEES. Pt reports vocal hoarseness starting 10-15-20 that has progressed. She denies pain but reports by mid day "I notice I have trouble pronouncing certain sounds when I talk". Pt states "I cough if I take big sips of water." PMH: lung cancer approx 10 years ago, Pt saw Dr. Constance Holster 11-21-20 and indirect scope ID'd what appeared to be bil vocal fold paresis, takes med for  GER. Pt evaluated by outpatient ST last month who recommended FEES.   No data recorded   Assessment / Plan / Recommendation  [facility] IP CLINICAL IMPRESSIONS 01/26/2021  Clinical Impression Inspection of pt's laryngeal anatomy during FEES raises concern for possible abnormailites. There were multiple raised whitish bumps covering base of tongue, sides of pharynx that were significant in size and diameter (papillae?). Also noted was fairly large bulbous tissue at base of tongue near vallecular space. Her arytenoid cartilidges were asymmetrical and adducting with somewhat jerky movements. Difficult to view vocal  cord adduction due to contraction resulting in pushing scope from laryngeal vestibule. She did appear to have decreased vocal cord adduction. Her laryngeal protection during swallow of thin and solid texture (cracker) was appropriate and timely preventing penetration or aspiration during FEES. There is potential for penetration if larger sip taken as thin viewed reaching pyriform sinsues before epiglottic inversion but swallow was initiated immediately after. Recommend to continue regular/thin, pills with thin and small sips. Also recommend ENT visit to assess for potential laryngeal abnormalities.  SLP Visit Diagnosis Dysphagia, unspecified (R13.10)  Attention and concentration deficit following --  Frontal lobe and executive function deficit following --  Impact on safety and function Mild aspiration risk      CH[facility] IP TREATMENT RECOMMENDATION 01/26/2021  Treatment Recommendations No treatment recommended at this time     No flowsheet data found.  CH[facility] IP DIET RECOMMENDATION 01/26/2021  SLP Diet Recommendations Regular solids;Thin liquid  Liquid Administration via Cup;Straw  Medication Administration Whole meds with liquid  Compensations Small sips/bites  Postural Changes Remain semi-upright after after feeds/meals (Comment);Seated upright at 90 degrees      CH[facility] IP OTHER RECOMMENDATIONS 01/26/2021  Recommended Consults Consider ENT evaluation  Oral Care Recommendations Oral care BID  Other Recommendations --      CH[facility] IP FOLLOW UP RECOMMENDATIONS 01/26/2021  Follow up Recommendations Other (comment)      No flowsheet data found.         CH[facility] IP ORAL PHASE 01/26/2021  Oral Phase WFL  Oral - Pudding Teaspoon --  Oral - Pudding Cup --  Oral - Honey Teaspoon --  Oral - Honey Cup --  Oral - Nectar Teaspoon --  Oral - Nectar Cup --  Oral - Nectar Straw --  Oral - Thin Teaspoon --  Oral - Thin Cup --  Oral - Thin Straw --  Oral - Puree --  Oral - Mech Soft --  Oral - Regular --  Oral -  Multi-Consistency --  Oral - Pill --  Oral Phase - Comment --    CH[facility] IP PHARYNGEAL PHASE 01/26/2021  Pharyngeal Phase WFL  Pharyngeal- Pudding Teaspoon --  Pharyngeal --  Pharyngeal- Pudding Cup --  Pharyngeal --  Pharyngeal- Honey Teaspoon --  Pharyngeal --  Pharyngeal- Honey Cup --  Pharyngeal --  Pharyngeal- Nectar Teaspoon --  Pharyngeal --  Pharyngeal- Nectar Cup --  Pharyngeal --  Pharyngeal- Nectar Straw --  Pharyngeal --  Pharyngeal- Thin Teaspoon --  Pharyngeal --  Pharyngeal- Thin Cup --  Pharyngeal --  Pharyngeal- Thin Straw --  Pharyngeal --  Pharyngeal- Puree --  Pharyngeal --  Pharyngeal- Mechanical Soft --  Pharyngeal --  Pharyngeal- Regular --  Pharyngeal --  Pharyngeal- Multi-consistency --  Pharyngeal --  Pharyngeal- Pill --  Pharyngeal --  Pharyngeal Comment --     CH[facility] IP CERVICAL ESOPHAGEAL PHASE 01/26/2021  Cervical Esophageal Phase WFL  Pudding Teaspoon --  Pudding Cup --  Honey Teaspoon --  Honey Cup --  Nectar Teaspoon --  Nectar Cup --  Nectar Straw --  Thin Teaspoon --  Thin Cup --  Thin Straw --  Puree --  Mechanical Soft --  Regular --  Multi-consistency --  Pill --  Cervical Esophageal Comment --     Houston Siren 01/26/2021, 5:07 PM Orbie Pyo Colvin Caroli.Ed Risk analyst [phone] Office [phone]

## 2021-01-27 ENCOUNTER — Ambulatory Visit: Payer: Medicare Other

## 2021-01-30 ENCOUNTER — Inpatient Hospital Stay: Payer: Medicare Other | Admitting: Internal Medicine

## 2021-01-30 ENCOUNTER — Other Ambulatory Visit: Payer: Self-pay

## 2021-01-30 ENCOUNTER — Inpatient Hospital Stay: Payer: Medicare Other | Attending: Internal Medicine

## 2021-01-30 VITALS — BP 163/93 | HR 101 | Temp 97.1°F | Resp 18 | Ht 64.0 in | Wt 107.9 lb

## 2021-01-30 DIAGNOSIS — M129 Arthropathy, unspecified: Secondary | ICD-10-CM | POA: Diagnosis not present

## 2021-01-30 DIAGNOSIS — R490 Dysphonia: Secondary | ICD-10-CM | POA: Insufficient documentation

## 2021-01-30 DIAGNOSIS — F329 Major depressive disorder, single episode, unspecified: Secondary | ICD-10-CM | POA: Insufficient documentation

## 2021-01-30 DIAGNOSIS — C3491 Malignant neoplasm of unspecified part of right bronchus or lung: Secondary | ICD-10-CM

## 2021-01-30 DIAGNOSIS — I10 Essential (primary) hypertension: Secondary | ICD-10-CM | POA: Diagnosis not present

## 2021-01-30 DIAGNOSIS — Z5181 Encounter for therapeutic drug level monitoring: Secondary | ICD-10-CM | POA: Diagnosis not present

## 2021-01-30 DIAGNOSIS — J384 Edema of larynx: Secondary | ICD-10-CM | POA: Diagnosis not present

## 2021-01-30 DIAGNOSIS — C3490 Malignant neoplasm of unspecified part of unspecified bronchus or lung: Secondary | ICD-10-CM | POA: Diagnosis not present

## 2021-01-30 DIAGNOSIS — K219 Gastro-esophageal reflux disease without esophagitis: Secondary | ICD-10-CM | POA: Diagnosis not present

## 2021-01-30 DIAGNOSIS — J449 Chronic obstructive pulmonary disease, unspecified: Secondary | ICD-10-CM | POA: Diagnosis not present

## 2021-01-30 DIAGNOSIS — J383 Other diseases of vocal cords: Secondary | ICD-10-CM | POA: Diagnosis not present

## 2021-01-30 DIAGNOSIS — C342 Malignant neoplasm of middle lobe, bronchus or lung: Secondary | ICD-10-CM | POA: Insufficient documentation

## 2021-01-30 LAB — CBC WITH DIFFERENTIAL (CANCER CENTER ONLY)
Abs Immature Granulocytes: 0.02 10*3/uL (ref 0.00–0.07)
Basophils Absolute: 0 10*3/uL (ref 0.0–0.1)
Basophils Relative: 0 %
Eosinophils Absolute: 0.3 10*3/uL (ref 0.0–0.5)
Eosinophils Relative: 4 %
HCT: 39.4 % (ref 36.0–46.0)
Hemoglobin: 13.2 g/dL (ref 12.0–15.0)
Immature Granulocytes: 0 %
Lymphocytes Relative: 12 %
Lymphs Abs: 0.8 10*3/uL (ref 0.7–4.0)
MCH: 31.1 pg (ref 26.0–34.0)
MCHC: 33.5 g/dL (ref 30.0–36.0)
MCV: 92.7 fL (ref 80.0–100.0)
Monocytes Absolute: 0.7 10*3/uL (ref 0.1–1.0)
Monocytes Relative: 10 %
Neutro Abs: 5.4 10*3/uL (ref 1.7–7.7)
Neutrophils Relative %: 74 %
Platelet Count: 273 10*3/uL (ref 150–400)
RBC: 4.25 MIL/uL (ref 3.87–5.11)
RDW: 13.7 % (ref 11.5–15.5)
WBC Count: 7.2 10*3/uL (ref 4.0–10.5)
nRBC: 0 % (ref 0.0–0.2)

## 2021-01-30 LAB — CMP (CANCER CENTER ONLY)
ALT: 23 U/L (ref 0–44)
AST: 30 U/L (ref 15–41)
Albumin: 3.8 g/dL (ref 3.5–5.0)
Alkaline Phosphatase: 50 U/L (ref 38–126)
Anion gap: 8 (ref 5–15)
BUN: 31 mg/dL — ABNORMAL HIGH (ref 8–23)
CO2: 27 mmol/L (ref 22–32)
Calcium: 9.9 mg/dL (ref 8.9–10.3)
Chloride: 105 mmol/L (ref 98–111)
Creatinine: 1.03 mg/dL — ABNORMAL HIGH (ref 0.44–1.00)
GFR, Estimated: 54 mL/min — ABNORMAL LOW (ref >60)
Glucose, Bld: 95 mg/dL (ref 70–99)
Potassium: 4 mmol/L (ref 3.5–5.1)
Sodium: 140 mmol/L (ref 135–145)
Total Bilirubin: 1.2 mg/dL (ref 0.3–1.2)
Total Protein: 6.6 g/dL (ref 6.5–8.1)

## 2021-01-30 NOTE — Progress Notes (Signed)
[place] [phone]   [phone]  OFFICE PROGRESS NOTE  [name], MD [place] [address]  DIAGNOSIS: Stage IIIB (T2a, N3, M0) non-small cell lung cancer, adenocarcinoma with positive EGFR mutation diagnosed in January 2010.  PRIOR THERAPY: 1) Status post concurrent chemoradiation with weekly carboplatin and paclitaxel; last dose given March 21, 2009.  2) Tarceva at 150 mg p.o. daily, status post approximately 48 months of treatment, discontinued secondary to persistent diarrhea.  3) status post right Pleurx catheter placement for right nonmalignant pleural effusion.  CURRENT THERAPY: Tarceva 100 mg by mouth daily started 03/19/2013, status post 94 months of treatment.  INTERVAL HISTORY: Dana [name] 86 y.o. female returns to the clinic today for follow-up visit.  The patient is feeling fine today with no concerning complaints except for hoarseness of her voice and she is currently seen by [facility].  She is scheduled for laryngoscopy soon.  She denied having any current chest pain, shortness of breath, cough or hemoptysis.  She denied having any fever or chills.  She has no nausea, vomiting, diarrhea or constipation.  She denied having any headache or visual changes.  She is here today for evaluation and repeat blood work.  MEDICAL HISTORY: Past Medical History:  Diagnosis Date  . Arthritis    HANDS,  WRISTS  . COPD (chronic obstructive pulmonary disease) ([place])   . Depression 04/04/2017  . Dyspnea on exertion   . Encounter for therapeutic drug monitoring 10/01/2016  . GERD (gastroesophageal reflux disease)   . Hemorrhoid   . History of anal fissures   . History of lung cancer ONCOLOGIST--  DR [facility]--  LAST CT ,  NO RECURRENCE OR METS   DX JAN 2010 --  STAGE IIIA  NON-SMALL CELL ADENOCARCINOMA (RIGHT MIDDLE LOBE)---  S/P  CHEMORADIATIO THERAPY  (COMPLETE 03-21-2009)  . HTN (hypertension) 10/10/2017  .  Imbalance 06/04/2017  . lung ca dx'd 2010  . Normal cardiac stress test    2007  PER PT  . Osteoporosis   . Rash, skin    RIGHT FOREARM/ HAND  . Right wrist pain    MASS  . Wears glasses     ALLERGIES:  is allergic to clindamycin hcl, amoxicillin, and protonix [pantoprazole sodium].  MEDICATIONS:  Current Outpatient Medications  Medication Sig Dispense Refill  . augmented betamethasone dipropionate (DIPROLENE-AF) 0.05 % ointment Apply 1 application topically 2 (two) times daily as needed (dry skin).     . bismuth subsalicylate (PEPTO BISMOL) 262 MG/15ML suspension Take 30 mLs by mouth every three (3) days as needed.    . cholecalciferol (VITAMIN D) 1000 UNITS tablet Take 1,000 Units by mouth daily.     . Coenzyme Q10 (COQ10) 100 MG CAPS Take 1 capsule by mouth daily.     . Cyanocobalamin (B-12) 1000 MCG CAPS Take 1,000 mcg by mouth in the morning and at bedtime.    Marland Kitchen denosumab (PROLIA) 60 MG/ML SOSY injection See admin instructions.    . diclofenac Sodium (VOLTAREN) 1 % GEL Apply 2 g topically 4 (four) times daily as needed (pain).    Marland Kitchen erlotinib (TARCEVA) 100 MG tablet Take 1 tablet by mouth daily.    Marland Kitchen loperamide (IMODIUM A-D) 2 MG tablet Take 1 mg by mouth 4 (four) times daily as needed for diarrhea or loose stools.     Marland Kitchen loratadine (CLARITIN) 10 MG tablet Take 10 mg by mouth daily as needed for allergies.    Marland Kitchen  meloxicam (MOBIC) 15 MG tablet Take 15 mg by mouth daily.  4  . mirtazapine (REMERON) 15 MG tablet TAKE 1 TABLET BY MOUTH AT BEDTIME 30 tablet 0  . Polyethyl Glycol-Propyl Glycol (SYSTANE OP) Place 1 drop into both eyes at bedtime as needed (for dry eyes.).    Marland Kitchen Simethicone (GAS-X PO) Take 1 tablet by mouth 2 (two) times daily as needed (for gas).     . simvastatin (ZOCOR) 20 MG tablet Take 1 tablet (20 mg total) by mouth every Monday, Wednesday, and Friday. 39 tablet 3  . STIOLTO RESPIMAT 2.5-2.5 MCG/ACT AERS INHALE 2 PUFFS BY MOUTH ONCE DAILY (Patient taking differently:  Inhale 2 puffs into the lungs daily.) 4 g 6   No current facility-administered medications for this visit.    SURGICAL HISTORY:  Past Surgical History:  Procedure Laterality Date  . ANAL FISSURE REPAIR  07/09/2011   INTERNAL SPHINCTEROTOMY  . BENIGN EXCISION LEFT BREAST CENTRAL DUCT  02/1999  . BREAST BIOPSY  01/31/2009   benign  . BREAST EXCISIONAL BIOPSY Left 2004   benign  . CHEST TUBE INSERTION Right 07/14/2014   Procedure: INSERTION PLEURAL DRAINAGE CATHETER RIGHT CHEST;  Surgeon: Grace Isaac, MD;  Location: [place];  Service: Thoracic;  Laterality: Right;  . EXCISION RIGHT WRIST MASS  2012  . EXTRACORPOREAL SHOCK WAVE LITHOTRIPSY Left 06/01/2019   Procedure: EXTRACORPOREAL SHOCK WAVE LITHOTRIPSY (ESWL);  Surgeon: Cleon Gustin, MD;  Location: [facility];  Service: Urology;  Laterality: Left;  . KNEE ARTHROSCOPY Right 1999  . MASS EXCISION Right 03/11/2014   Procedure: RIGHT WRIST DEEP MASS EXCISION WITH CULTURE AND BIOSPY;  Surgeon: Linna Hoff, MD;  Location: [place];  Service: Orthopedics;  Laterality: Right;  . REMOVAL OF PLEURAL DRAINAGE CATHETER Right 10/14/2014   Procedure: REMOVAL OF PLEURAL DRAINAGE CATHETER;  Surgeon: Grace Isaac, MD;  Location: [place];  Service: Thoracic;  Laterality: Right;  . TALC PLEURODESIS Right 09/16/2014   Procedure: TALC PLEURADESIS/ slurry;  Surgeon: Grace Isaac, MD;  Location: [place];  Service: Thoracic;  Laterality: Right;  . THORACOSCOPY  01-26-2009   w/   lung/  node biopsy's  . THUMB ARTHROSCOPY Left    - removed bone spur  . TONSILLECTOMY  AS CHILD  . TOTAL ABDOMINAL HYSTERECTOMY W/ BILATERAL SALPINGOOPHORECTOMY  1982   W/  APPENDECTOMY  . TRANSTHORACIC ECHOCARDIOGRAM  12-23-2008   NORMAL LV/  EF 65-70%/  MILD MR  &  TR  . VAULT SUSPENSION PLUS CYSTOCELE REPAIR WITH GRAFT  06-13-2010    REVIEW OF SYSTEMS:  A comprehensive review of systems was negative except for: Constitutional: positive for  fatigue Ears, nose, mouth, throat, and face: positive for hoarseness   PHYSICAL EXAMINATION: General appearance: alert, cooperative, fatigued and no distress Head: Normocephalic, without obvious abnormality, atraumatic Neck: no adenopathy, no JVD, supple, symmetrical, trachea midline and thyroid not enlarged, symmetric, no tenderness/mass/nodules Lymph nodes: Cervical, supraclavicular, and axillary nodes normal. Resp: clear to auscultation bilaterally Back: symmetric, no curvature. ROM normal. No CVA tenderness. Cardio: regular rate and rhythm, S1, S2 normal, no murmur, click, rub or gallop GI: soft, non-tender; bowel sounds normal; no masses,  no organomegaly Extremities: extremities normal, atraumatic, no cyanosis or edema  ECOG PERFORMANCE STATUS: 1 - Symptomatic but completely ambulatory  Blood pressure (!) 163/93, pulse (!) 101, temperature (!) 97.1 F (36.2 C), temperature source Tympanic, resp. rate 18, height 5' 4" (1.626 m), weight 107 lb 14.4 oz (48.9 kg),  SpO2 97 %.  LABORATORY DATA: Lab Results  Component Value Date   WBC 7.2 01/30/2021   HGB 13.2 01/30/2021   HCT 39.4 01/30/2021   MCV 92.7 01/30/2021   PLT 273 01/30/2021      Chemistry      Component Value Date/Time   NA 142 12/19/2020 1005   NA 142 12/10/2017 1000   K 4.9 12/19/2020 1005   K 4.0 12/10/2017 1000   CL 106 12/19/2020 1005   CL 108 (H) 06/03/2013 0939   CO2 30 12/19/2020 1005   CO2 28 12/10/2017 1000   BUN 28 (H) 12/19/2020 1005   BUN 20.4 12/10/2017 1000   CREATININE 1.02 (H) 12/19/2020 1005   CREATININE 0.9 12/10/2017 1000      Component Value Date/Time   CALCIUM 9.7 12/19/2020 1005   CALCIUM 9.7 12/10/2017 1000   ALKPHOS 44 12/19/2020 1005   ALKPHOS 63 12/10/2017 1000   AST 37 12/19/2020 1005   AST 25 12/10/2017 1000   ALT 27 12/19/2020 1005   ALT 17 12/10/2017 1000   BILITOT 1.0 12/19/2020 1005   BILITOT 1.05 12/10/2017 1000       RADIOGRAPHIC STUDIES: No results  found.  ASSESSMENT AND PLAN:  This is a very pleasant 86 years old white female with stage IIIB non-small cell lung cancer, adenocarcinoma with positive EGFR mutation diagnosed in January 2010. She has been on treatment with Tarceva for more than 9 years.   She is currently on Tarceva 100 mg p.o. daily status post 94  months. The patient is tolerating this treatment well with no concerning adverse effects. I recommended for her to continue her current treatment with Tarceva with the same dose.  I will see her back for follow-up visit in 6 weeks for evaluation with repeat CT scan of the chest for restaging of her disease. I also discussed with the patient the option of discontinuing her treatment with Tarceva since she has been on this treatment for close to 10 years.  She is not interested in discontinuing the treatment and she would like to stay on her current treatment with Tarceva. She was advised to call immediately if she has any concerning symptoms in the interval. The patient voices understanding of current disease status and treatment options and is in agreement with the current care plan. All questions were answered. The patient knows to call the clinic with any problems, questions or concerns. We can certainly see the patient much sooner if necessary. Disclaimer: This note was dictated with voice recognition software. Similar sounding words can inadvertently be transcribed and may not be corrected upon review.

## 2021-01-31 ENCOUNTER — Telehealth: Payer: Self-pay | Admitting: Medical Oncology

## 2021-01-31 ENCOUNTER — Other Ambulatory Visit: Payer: Self-pay | Admitting: Medical Oncology

## 2021-01-31 DIAGNOSIS — C3490 Malignant neoplasm of unspecified part of unspecified bronchus or lung: Secondary | ICD-10-CM

## 2021-01-31 MED ORDER — ERLOTINIB HCL 100 MG PO TABS: 100.0000 mg | ORAL_TABLET | Freq: Every day | ORAL | 1 refills | Status: DC

## 2021-01-31 NOTE — Telephone Encounter (Signed)
err

## 2021-01-31 NOTE — Telephone Encounter (Signed)
Needs Tarceva refill.

## 2021-02-01 ENCOUNTER — Other Ambulatory Visit: Payer: Self-pay | Admitting: Medical Oncology

## 2021-02-01 ENCOUNTER — Other Ambulatory Visit: Payer: Self-pay | Admitting: Otolaryngology

## 2021-02-01 DIAGNOSIS — C3490 Malignant neoplasm of unspecified part of unspecified bronchus or lung: Secondary | ICD-10-CM

## 2021-02-01 MED ORDER — ERLOTINIB HCL 100 MG PO TABS: 100.0000 mg | ORAL_TABLET | Freq: Every day | ORAL | 1 refills | Status: DC

## 2021-02-02 DIAGNOSIS — L821 Other seborrheic keratosis: Secondary | ICD-10-CM | POA: Diagnosis not present

## 2021-02-02 DIAGNOSIS — M793 Panniculitis, unspecified: Secondary | ICD-10-CM | POA: Diagnosis not present

## 2021-02-02 DIAGNOSIS — Z85828 Personal history of other malignant neoplasm of skin: Secondary | ICD-10-CM | POA: Diagnosis not present

## 2021-02-02 DIAGNOSIS — L570 Actinic keratosis: Secondary | ICD-10-CM | POA: Diagnosis not present

## 2021-02-02 DIAGNOSIS — L308 Other specified dermatitis: Secondary | ICD-10-CM | POA: Diagnosis not present

## 2021-02-02 DIAGNOSIS — D485 Neoplasm of uncertain behavior of skin: Secondary | ICD-10-CM | POA: Diagnosis not present

## 2021-02-03 ENCOUNTER — Ambulatory Visit: Payer: Medicare Other

## 2021-02-04 ENCOUNTER — Other Ambulatory Visit (HOSPITAL_COMMUNITY)
Admission: RE | Admit: 2021-02-04 | Discharge: 2021-02-04 | Disposition: A | Discharge status: Home / Self Care | Payer: Medicare Other | Source: Ambulatory Visit | Attending: Otolaryngology | Admitting: Otolaryngology

## 2021-02-04 DIAGNOSIS — Z01812 Encounter for preprocedural laboratory examination: Secondary | ICD-10-CM | POA: Insufficient documentation

## 2021-02-04 DIAGNOSIS — Z20822 Contact with and (suspected) exposure to covid-19: Secondary | ICD-10-CM | POA: Diagnosis not present

## 2021-02-04 LAB — SARS CORONAVIRUS 2 (TAT 6-24 HRS): SARS Coronavirus 2: NEGATIVE

## 2021-02-07 ENCOUNTER — Encounter (HOSPITAL_COMMUNITY): Payer: Self-pay | Admitting: Otolaryngology

## 2021-02-07 NOTE — Progress Notes (Signed)
Dana [name] denies chest pain or shortness of breath. Patient  tested negative for Covid and has been in quarantine since that time.  Dana [name] walks with a walker, I encouraged  Patient to have husband to get a wheel chair at the front door, the he will not have to keep it.

## 2021-02-08 ENCOUNTER — Encounter (HOSPITAL_COMMUNITY): Payer: Self-pay | Admitting: Otolaryngology

## 2021-02-08 ENCOUNTER — Encounter (HOSPITAL_COMMUNITY)
Admission: RE | Disposition: A | Discharge status: Home / Self Care | Payer: Self-pay | Source: Home / Self Care | Attending: Otolaryngology

## 2021-02-08 ENCOUNTER — Ambulatory Visit (HOSPITAL_COMMUNITY): Payer: Medicare Other | Admitting: Certified Registered Nurse Anesthetist

## 2021-02-08 ENCOUNTER — Other Ambulatory Visit: Payer: Self-pay

## 2021-02-08 ENCOUNTER — Ambulatory Visit (HOSPITAL_COMMUNITY)
Admission: RE | Admit: 2021-02-08 | Discharge: 2021-02-08 | Disposition: A | Discharge status: Home / Self Care | Payer: Medicare Other | Attending: Otolaryngology | Admitting: Otolaryngology

## 2021-02-08 DIAGNOSIS — F32A Depression, unspecified: Secondary | ICD-10-CM | POA: Diagnosis not present

## 2021-02-08 DIAGNOSIS — J449 Chronic obstructive pulmonary disease, unspecified: Secondary | ICD-10-CM | POA: Insufficient documentation

## 2021-02-08 DIAGNOSIS — R490 Dysphonia: Secondary | ICD-10-CM | POA: Diagnosis not present

## 2021-02-08 DIAGNOSIS — Z8051 Family history of malignant neoplasm of kidney: Secondary | ICD-10-CM | POA: Diagnosis not present

## 2021-02-08 DIAGNOSIS — Z9221 Personal history of antineoplastic chemotherapy: Secondary | ICD-10-CM | POA: Insufficient documentation

## 2021-02-08 DIAGNOSIS — Z87891 Personal history of nicotine dependence: Secondary | ICD-10-CM | POA: Diagnosis not present

## 2021-02-08 DIAGNOSIS — J383 Other diseases of vocal cords: Secondary | ICD-10-CM | POA: Diagnosis not present

## 2021-02-08 DIAGNOSIS — Z8052 Family history of malignant neoplasm of bladder: Secondary | ICD-10-CM | POA: Insufficient documentation

## 2021-02-08 DIAGNOSIS — Z923 Personal history of irradiation: Secondary | ICD-10-CM | POA: Diagnosis not present

## 2021-02-08 DIAGNOSIS — Z791 Long term (current) use of non-steroidal anti-inflammatories (NSAID): Secondary | ICD-10-CM | POA: Diagnosis not present

## 2021-02-08 DIAGNOSIS — Z85118 Personal history of other malignant neoplasm of bronchus and lung: Secondary | ICD-10-CM | POA: Diagnosis not present

## 2021-02-08 DIAGNOSIS — Z888 Allergy status to other drugs, medicaments and biological substances status: Secondary | ICD-10-CM | POA: Insufficient documentation

## 2021-02-08 DIAGNOSIS — Z79899 Other long term (current) drug therapy: Secondary | ICD-10-CM | POA: Diagnosis not present

## 2021-02-08 DIAGNOSIS — Z880 Allergy status to penicillin: Secondary | ICD-10-CM | POA: Insufficient documentation

## 2021-02-08 DIAGNOSIS — I10 Essential (primary) hypertension: Secondary | ICD-10-CM | POA: Diagnosis not present

## 2021-02-08 DIAGNOSIS — N179 Acute kidney failure, unspecified: Secondary | ICD-10-CM | POA: Diagnosis not present

## 2021-02-08 HISTORY — DX: Personal history of urinary calculi: Z87.442

## 2021-02-08 HISTORY — PX: MICROLARYNGOSCOPY W/VOCAL CORD INJECTION: SHX2665

## 2021-02-08 SURGERY — MICROLARYNGOSCOPY, WITH VOCAL CORD INJECTION
Anesthesia: General | Site: Throat | Laterality: Bilateral

## 2021-02-08 MED ORDER — ROCURONIUM BROMIDE 10 MG/ML (PF) SYRINGE
PREFILLED_SYRINGE | INTRAVENOUS | Status: DC | PRN
  Administered 2021-02-08: 40 mg via INTRAVENOUS

## 2021-02-08 MED ORDER — LACTATED RINGERS IV SOLN: INTRAVENOUS | Status: DC

## 2021-02-08 MED ORDER — FENTANYL CITRATE (PF) 250 MCG/5ML IJ SOLN
INTRAMUSCULAR | Status: AC
  Filled 2021-02-08: qty 5

## 2021-02-08 MED ORDER — FENTANYL CITRATE (PF) 250 MCG/5ML IJ SOLN
INTRAMUSCULAR | Status: DC | PRN
  Administered 2021-02-08: 50 ug via INTRAVENOUS

## 2021-02-08 MED ORDER — AMISULPRIDE (ANTIEMETIC) 5 MG/2ML IV SOLN: 10.0000 mg | Freq: Once | INTRAVENOUS | Status: DC | PRN

## 2021-02-08 MED ORDER — PROPOFOL 10 MG/ML IV BOLUS
INTRAVENOUS | Status: DC | PRN
  Administered 2021-02-08: 50 mg via INTRAVENOUS
  Administered 2021-02-08: 120 mg via INTRAVENOUS

## 2021-02-08 MED ORDER — FENTANYL CITRATE (PF) 100 MCG/2ML IJ SOLN
INTRAMUSCULAR | Status: AC
  Filled 2021-02-08: qty 2

## 2021-02-08 MED ORDER — SUGAMMADEX SODIUM 200 MG/2ML IV SOLN
INTRAVENOUS | Status: DC | PRN
  Administered 2021-02-08: 200 mg via INTRAVENOUS

## 2021-02-08 MED ORDER — ORAL CARE MOUTH RINSE: 15.0000 mL | Freq: Once | OROMUCOSAL | Status: AC

## 2021-02-08 MED ORDER — EPINEPHRINE HCL (NASAL) 0.1 % NA SOLN
NASAL | Status: AC
  Filled 2021-02-08: qty 30

## 2021-02-08 MED ORDER — TRIAMCINOLONE ACETONIDE 40 MG/ML IJ SUSP
INTRAMUSCULAR | Status: AC
  Filled 2021-02-08: qty 5

## 2021-02-08 MED ORDER — ACETAMINOPHEN 500 MG PO TABS
1000.0000 mg | ORAL_TABLET | Freq: Once | ORAL | Status: AC
  Administered 2021-02-08: 1000 mg via ORAL
  Filled 2021-02-08: qty 2

## 2021-02-08 MED ORDER — LIDOCAINE 2% (20 MG/ML) 5 ML SYRINGE
INTRAMUSCULAR | Status: DC | PRN
  Administered 2021-02-08: 40 mg via INTRAVENOUS

## 2021-02-08 MED ORDER — ONDANSETRON HCL 4 MG/2ML IJ SOLN
INTRAMUSCULAR | Status: DC | PRN
  Administered 2021-02-08: 4 mg via INTRAVENOUS

## 2021-02-08 MED ORDER — CELECOXIB 200 MG PO CAPS
200.0000 mg | ORAL_CAPSULE | Freq: Once | ORAL | Status: AC
  Administered 2021-02-08: 200 mg via ORAL
  Filled 2021-02-08: qty 1

## 2021-02-08 MED ORDER — PROPOFOL 10 MG/ML IV BOLUS
INTRAVENOUS | Status: AC
  Filled 2021-02-08: qty 20

## 2021-02-08 MED ORDER — FENTANYL CITRATE (PF) 100 MCG/2ML IJ SOLN
25.0000 ug | INTRAMUSCULAR | Status: DC | PRN
  Administered 2021-02-08: 50 ug via INTRAVENOUS

## 2021-02-08 MED ORDER — CHLORHEXIDINE GLUCONATE 0.12 % MT SOLN
15.0000 mL | Freq: Once | OROMUCOSAL | Status: AC
  Administered 2021-02-08: 15 mL via OROMUCOSAL

## 2021-02-08 MED ORDER — DEXAMETHASONE SODIUM PHOSPHATE 10 MG/ML IJ SOLN
INTRAMUSCULAR | Status: DC | PRN
  Administered 2021-02-08: 10 mg via INTRAVENOUS

## 2021-02-08 SURGICAL SUPPLY — 30 items
CANISTER SUCT 3000ML PPV (MISCELLANEOUS) ×2 IMPLANT
CNTNR URN SCR LID CUP LEK RST (MISCELLANEOUS) IMPLANT
CONT SPEC 4OZ STRL OR WHT (MISCELLANEOUS)
COVER BACK TABLE 60X90IN (DRAPES) ×2 IMPLANT
COVER MAYO STAND STRL (DRAPES) ×2 IMPLANT
COVER WAND RF STERILE (DRAPES) ×2 IMPLANT
DRAPE HALF SHEET 40X57 (DRAPES) ×2 IMPLANT
GAUZE SPONGE 4X4 12PLY STRL (GAUZE/BANDAGES/DRESSINGS) ×2 IMPLANT
GLOVE BIO SURGEON STRL SZ7.5 (GLOVE) ×2 IMPLANT
GOWN STRL REUS W/ TWL LRG LVL3 (GOWN DISPOSABLE) IMPLANT
GOWN STRL REUS W/TWL LRG LVL3 (GOWN DISPOSABLE)
GUARD TEETH (MISCELLANEOUS) IMPLANT
KIT BASIN OR (CUSTOM PROCEDURE TRAY) ×2 IMPLANT
KIT PROLARN PLUS GEL W/NDL (Prosthesis and Implant ENT) ×2 IMPLANT
KIT TURNOVER KIT B (KITS) ×2 IMPLANT
NDL HYPO 25GX1X1/2 BEV (NEEDLE) IMPLANT
NDL TRANS ORAL INJECTION (NEEDLE) IMPLANT
NEEDLE HYPO 25GX1X1/2 BEV (NEEDLE) IMPLANT
NEEDLE TRANS ORAL INJECTION (NEEDLE) IMPLANT
NS IRRIG 1000ML POUR BTL (IV SOLUTION) ×2 IMPLANT
PAD ARMBOARD 7.5X6 YLW CONV (MISCELLANEOUS) ×4 IMPLANT
PATTIES SURGICAL .5 X1 (DISPOSABLE) IMPLANT
PATTIES SURGICAL .5 X3 (DISPOSABLE) IMPLANT
POSITIONER HEAD DONUT 9IN (MISCELLANEOUS) IMPLANT
SOL ANTI FOG 6CC (MISCELLANEOUS) ×1 IMPLANT
SOLUTION ANTI FOG 6CC (MISCELLANEOUS) ×1
SURGILUBE 2OZ TUBE FLIPTOP (MISCELLANEOUS) IMPLANT
TOWEL GREEN STERILE FF (TOWEL DISPOSABLE) ×4 IMPLANT
TUBE CONNECTING 12X1/4 (SUCTIONS) ×2 IMPLANT
WATER STERILE IRR 1000ML POUR (IV SOLUTION) IMPLANT

## 2021-02-08 NOTE — H&P (Signed)
IDONIA ZOLLINGER is an 86 y.o. female.   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  She presents for surgical management.  Past Medical History:  Diagnosis Date  . Arthritis    HANDS,  WRISTS  . COPD (chronic obstructive pulmonary disease) ([place])   . Depression 04/04/2017  . Dyspnea on exertion   . Encounter for therapeutic drug monitoring 10/01/2016  . GERD (gastroesophageal reflux disease)    at times  . Hemorrhoid   . History of anal fissures   . History of kidney stones   . History of lung cancer ONCOLOGIST--  DR [facility]--  LAST CT ,  NO RECURRENCE OR METS   DX JAN 2010 --  STAGE IIIA  NON-SMALL CELL ADENOCARCINOMA (RIGHT MIDDLE LOBE)---  S/P  CHEMORADIATIO THERAPY  (COMPLETE 03-21-2009)  . HTN (hypertension) 10/10/2017   white coat syndrome  . Imbalance 06/04/2017  . lung ca dx'd 2010  . Normal cardiac stress test    2007  PER PT  . Osteoporosis   . Rash, skin    RIGHT FOREARM/ HAND  . Right wrist pain    MASS  . Wears glasses     Past Surgical History:  Procedure Laterality Date  . ANAL FISSURE REPAIR  07/09/2011   INTERNAL SPHINCTEROTOMY  . BENIGN EXCISION LEFT BREAST CENTRAL DUCT  02/1999  . BREAST BIOPSY  01/31/2009   benign  . BREAST EXCISIONAL BIOPSY Left 2004   benign  . CHEST TUBE INSERTION Right 07/14/2014   Procedure: INSERTION PLEURAL DRAINAGE CATHETER RIGHT CHEST;  Surgeon: Grace Isaac, MD;  Location: [place];  Service: Thoracic;  Laterality: Right;  . EXCISION RIGHT WRIST MASS  2012  . EXTRACORPOREAL SHOCK WAVE LITHOTRIPSY Left 06/01/2019   Procedure: EXTRACORPOREAL SHOCK WAVE LITHOTRIPSY (ESWL);  Surgeon: Cleon Gustin, MD;  [facility];  Service: Urology;  Laterality: Left;  . KNEE ARTHROSCOPY Right 1999  . MASS EXCISION Right 03/11/2014   Procedure: RIGHT WRIST DEEP MASS EXCISION WITH CULTURE AND BIOSPY;  Surgeon: Linna Hoff, MD;  Location: [place];   Service: Orthopedics;  Laterality: Right;  . REMOVAL OF PLEURAL DRAINAGE CATHETER Right 10/14/2014   Procedure: REMOVAL OF PLEURAL DRAINAGE CATHETER;  Surgeon: Grace Isaac, MD;  Location: [place];  Service: Thoracic;  Laterality: Right;  . TALC PLEURODESIS Right 09/16/2014   Procedure: TALC PLEURADESIS/ slurry;  Surgeon: Grace Isaac, MD;  Location: [place];  Service: Thoracic;  Laterality: Right;  . THORACOSCOPY  01-26-2009   w/   lung/  node biopsy's  . THUMB ARTHROSCOPY Left    - removed bone spur  . TONSILLECTOMY  AS CHILD  . TOTAL ABDOMINAL HYSTERECTOMY W/ BILATERAL SALPINGOOPHORECTOMY  1982   W/  APPENDECTOMY  . TRANSTHORACIC ECHOCARDIOGRAM  12-23-2008   NORMAL LV/  EF 65-70%/  MILD MR  &  TR  . VAULT SUSPENSION PLUS CYSTOCELE REPAIR WITH GRAFT  06-13-2010    Family History  Problem Relation Age of Onset  . Cancer Father        bladder  . Cancer Son        kidney - removed as a teenager   Social History:  reports that she quit smoking about 43 years ago. Her smoking use included cigarettes. She has a 30.00 pack-year smoking history. She has never used smokeless tobacco. She reports that she does not drink alcohol and does not use drugs.  Allergies:  Allergies  Allergen Reactions  . Clindamycin Hcl Diarrhea and Other (See Comments)  . Amoxicillin Diarrhea    Hx of C Diff   . Protonix [Pantoprazole Sodium] Rash    Medications Prior to Admission  Medication Sig Dispense Refill  . b complex vitamins capsule Take 1 capsule by mouth daily.    . Calcium-Vitamin D ([employer] CALCIUM-600/VIT D PO) Take 2 tablets by mouth daily.    . cholecalciferol (VITAMIN D) 1000 UNITS tablet Take 2,000 Units by mouth daily.    . diclofenac Sodium (VOLTAREN) 1 % GEL Apply 2 g topically 4 (four) times daily as needed (pain).    Marland Kitchen erlotinib (TARCEVA) 100 MG tablet Take 1 tablet (100 mg total) by mouth daily. (Patient taking differently: Take 100 mg by mouth at bedtime.) 30 tablet 1  . lactase  (LACTAID) 3000 units tablet Take 3,000 Units by mouth daily.    Marland Kitchen loratadine (CLARITIN) 10 MG tablet Take 10 mg by mouth daily as needed for allergies.    . meloxicam (MOBIC) 15 MG tablet Take 15 mg by mouth daily as needed for pain.  4  . Misc Natural Products (OSTEO BI-FLEX JOINT SHIELD PO) Take 2 tablets by mouth daily.    Vladimir Faster Glycol-Propyl Glycol (SYSTANE OP) Place 1 drop into both eyes at bedtime as needed (for dry eyes.).    Marland Kitchen Simethicone (GAS-X PO) Take 1 tablet by mouth 2 (two) times daily as needed (for gas).     . STIOLTO RESPIMAT 2.5-2.5 MCG/ACT AERS INHALE 2 PUFFS BY MOUTH ONCE DAILY (Patient taking differently: Inhale 2 puffs into the lungs daily.) 4 g 6  . vitamin E 180 MG (400 UNITS) capsule Take 400 Units by mouth daily.    Marland Kitchen bismuth subsalicylate (PEPTO BISMOL) 262 MG/15ML suspension Take 30 mLs by mouth every three (3) days as needed.      No results found for this or any previous visit (from the past 48 hour(s)). No results found.  Review of Systems  HENT: Positive for voice change.   All other systems reviewed and are negative.   Blood pressure (!) 164/76, pulse 96, temperature 98 F (36.7 C), temperature source Oral, resp. rate 18, height 5\' 4"  (1.626 m), weight 48.5 kg, SpO2 99 %. Physical Exam Constitutional:      Appearance: Normal appearance. She is normal weight.  HENT:     Head: Normocephalic and atraumatic.     Right Ear: External ear normal.     Left Ear: External ear normal.     Nose: Nose normal.     Mouth/Throat:     Mouth: Mucous membranes are moist.     Pharynx: Oropharynx is clear.  Eyes:     Extraocular Movements: Extraocular movements intact.     Conjunctiva/sclera: Conjunctivae normal.     Pupils: Pupils are equal, round, and reactive to light.  Cardiovascular:     Rate and Rhythm: Normal rate.  Pulmonary:     Effort: Pulmonary effort is normal.  Skin:    General: Skin is warm and dry.  Neurological:     General: No focal  deficit present.     Mental Status: She is alert and oriented to person, place, and time.  Psychiatric:        Mood and Affect: Mood normal.        Behavior: Behavior normal.        Thought Content: Thought content normal.        Judgment: Judgment  normal.      Assessment/Plan Dysphonia, vocal fold atrophy  To OR for SMDL with Prolaryn injections.  Melida Quitter, MD 02/08/2021, 12:55 PM

## 2021-02-08 NOTE — Anesthesia Postprocedure Evaluation (Signed)
Anesthesia Post Note  Patient: Dana [name]  Procedure(s) Performed: MICROLARYNGOSCOPY WITH VOCAL CORD INJECTION (Bilateral Throat)     Patient location during evaluation: PACU Anesthesia Type: General Level of consciousness: sedated Pain management: pain level controlled Vital Signs Assessment: post-procedure vital signs reviewed and stable Respiratory status: spontaneous breathing and respiratory function stable Cardiovascular status: stable Postop Assessment: no apparent nausea or vomiting Anesthetic complications: no   No complications documented.  Last Vitals:  Vitals:   02/08/21 1443 02/08/21 1451  BP: (!) 154/81   Pulse: 89 89  Resp: 17 19  Temp:    SpO2: 90% 99%    Last Pain:  Vitals:   02/08/21 1451  TempSrc:   PainSc: 3                  Monta Police DANIEL

## 2021-02-08 NOTE — Brief Op Note (Signed)
02/08/2021  1:32 PM  PATIENT:  Dana [name]  86 y.o. female  PRE-OPERATIVE DIAGNOSIS:  VOCAL FOLD ATROPHY, DYSPHONIA  POST-OPERATIVE DIAGNOSIS:  VOCAL FOLD ATROPHY, DYSPHONIA  PROCEDURE:  Procedure(s): MICROLARYNGOSCOPY WITH VOCAL CORD INJECTION (Bilateral) Prolaryn  SURGEON:  Surgeon(s) and Role:    * Melida Quitter, MD - Primary  PHYSICIAN ASSISTANT:   ASSISTANTS: none   ANESTHESIA:   general  EBL: None  BLOOD ADMINISTERED:none  DRAINS: none   LOCAL MEDICATIONS USED:  NONE  SPECIMEN:  No Specimen  DISPOSITION OF SPECIMEN:  N/A  COUNTS:  YES  TOURNIQUET:  * No tourniquets in log *  DICTATION: .Note written in EPIC  PLAN OF CARE: Discharge to home after PACU  PATIENT DISPOSITION:  PACU - hemodynamically stable.   Delay start of Pharmacological VTE agent (>24hrs) due to surgical blood loss or risk of bleeding: no

## 2021-02-08 NOTE — Op Note (Signed)
PREOPERATIVE DIAGNOSIS:  Hoarseness and vocal cord atrophy   POSTOPERATIVE DIAGNOSIS:  Hoarseness and vocal cord atrophy   PROCEDURE:  Suspended microdirect laryngoscopy with Prolaryn injections   SURGEON:  Melida Quitter, MD   ANESTHESIA:  General with jet ventilation by anesthesia.   COMPLICATIONS:  None.   INDICATIONS:  The patient is an 86 year old female with a history of hoarseness found to be due to vocal fold atrophy.  She presents to the operating room for surgical management.   FINDINGS:  Thin vocal folds.  Mucosal cysts in vallecula.   DESCRIPTION OF PROCEDURE:  The patient was identified in the holding room, informed consent having been obtained including discussion of risks, benefits and alternatives, the patient was brought to the operative suite and put the operative table in  supine position.  Anesthesia was induced and the patient was maintained via mask ventilation.  The eyes were taped closed and bed was turned 90 degrees from anesthesia.  The patient was given intravenous steroids during the  case.  A tooth guard was placed over the upper teeth and a Stortz laryngoscope was placed into the supraglottic position and suspended to Mayo stand using the Lewy arm.  Jet ventilation was initiated.  Photodocumentation was performed with the zero degree telescope.  Prolaryn was then injected into the vocal folds, starting on the left and injecting about 2/3 in a posterior position and 1/3 in an anterior position laterally.  About 0.4 cc was injected in the left side and about 0.3 cc was injected in the right side.  Photodocumentation was repeated.  The larynx was sprayed with topical lidocaine.  The laryngoscope was then taking out of suspension and removed from the patient's mouth while suctioning the airway.  The tooth guard was removed and the patient was turned back to anesthesia for wakeup and taken to the recovery room in stable condition.

## 2021-02-08 NOTE — Transfer of Care (Signed)
Immediate Anesthesia Transfer of Care Note  Patient: Dana [name]  Procedure(s) Performed: MICROLARYNGOSCOPY WITH VOCAL CORD INJECTION (Bilateral Throat)  Patient Location: PACU  Anesthesia Type:General  Level of Consciousness: drowsy and patient cooperative  Airway & Oxygen Therapy: Patient Spontanous Breathing and Patient connected to face mask oxygen  Post-op Assessment: Report given to RN and Post -op Vital signs reviewed and stable  Post vital signs: Reviewed and stable  Last Vitals:  Vitals Value Taken Time  BP 114/68 02/08/21 1343  Temp    Pulse 93 02/08/21 1345  Resp 11 02/08/21 1345  SpO2 100 % 02/08/21 1345  Vitals shown include unvalidated device data.  Last Pain:  Vitals:   02/08/21 1032  TempSrc:   PainSc: 0-No pain      Patients Stated Pain Goal: 3 (53/91/22 5834)  Complications: No complications documented.

## 2021-02-08 NOTE — Anesthesia Preprocedure Evaluation (Addendum)
Anesthesia Evaluation    Reviewed: Allergy & Precautions, Patient's Chart, lab work & pertinent test results  History of Anesthesia Complications Negative for: history of anesthetic complications  Airway Mallampati: II  TM Distance: >3 FB Neck ROM: Full    Dental no notable dental hx. (+) Dental Advisory Given   Pulmonary COPD,  COPD inhaler, former smoker,  Hx Lung Ca   Pulmonary exam normal        Cardiovascular + DOE  Normal cardiovascular exam  IMPRESSIONS   1. Left ventricular ejection fraction, by estimation, is 60 to 65%. The left ventricle has normal function. The left ventricle has no regional wall motion abnormalities. There is mild left ventricular hypertrophy. Left ventricular diastolic parameters  are consistent with Grade I diastolic dysfunction (impaired relaxation). 2. Right ventricular systolic function is normal. The right ventricular size is normal. There is normal pulmonary artery systolic pressure. 3. The mitral valve is degenerative. Mild mitral valve regurgitation. No evidence of mitral stenosis. 4. The aortic valve is tricuspid. Aortic valve regurgitation is trivial. Mild aortic valve sclerosis is present, with no evidence of aortic valve stenosis. 5. The inferior vena cava is normal in size with greater than 50% respiratory variability, suggesting right atrial pressure of 3 mmHg.    Neuro/Psych PSYCHIATRIC DISORDERS Depression negative neurological ROS     GI/Hepatic Neg liver ROS, GERD  ,  Endo/Other  negative endocrine ROS  Renal/GU negative Renal ROS     Musculoskeletal negative musculoskeletal ROS (+)   Abdominal   Peds  Hematology negative hematology ROS (+)   Anesthesia Other Findings   Reproductive/Obstetrics                            Anesthesia Physical Anesthesia Plan  ASA: III  Anesthesia Plan: General   Post-op Pain Management:     Induction: Intravenous  PONV Risk Score and Plan: 4 or greater and Treatment may vary due to age or medical condition, Ondansetron and Dexamethasone  Airway Management Planned: Oral ETT  Additional Equipment:   Intra-op Plan:   Post-operative Plan: Extubation in OR  Informed Consent: I have reviewed the patients History and Physical, chart, labs and discussed the procedure including the risks, benefits and alternatives for the proposed anesthesia with the patient or authorized representative who has indicated his/her understanding and acceptance.     Dental advisory given  Plan Discussed with: Anesthesiologist  Anesthesia Plan Comments:        Anesthesia Quick Evaluation

## 2021-02-09 ENCOUNTER — Encounter (HOSPITAL_COMMUNITY): Payer: Self-pay | Admitting: Otolaryngology

## 2021-02-10 ENCOUNTER — Ambulatory Visit: Payer: Medicare Other

## 2021-02-14 DIAGNOSIS — R490 Dysphonia: Secondary | ICD-10-CM | POA: Diagnosis not present

## 2021-02-14 DIAGNOSIS — J383 Other diseases of vocal cords: Secondary | ICD-10-CM | POA: Diagnosis not present

## 2021-02-17 ENCOUNTER — Ambulatory Visit: Payer: Medicare Other

## 2021-02-20 ENCOUNTER — Encounter (HOSPITAL_COMMUNITY): Payer: Self-pay | Admitting: Otolaryngology

## 2021-02-21 ENCOUNTER — Other Ambulatory Visit: Payer: Self-pay | Admitting: Internal Medicine

## 2021-02-21 DIAGNOSIS — C3491 Malignant neoplasm of unspecified part of right bronchus or lung: Secondary | ICD-10-CM

## 2021-02-24 ENCOUNTER — Ambulatory Visit: Payer: Medicare Other

## 2021-03-02 ENCOUNTER — Other Ambulatory Visit: Payer: Self-pay | Admitting: Medical Oncology

## 2021-03-02 DIAGNOSIS — C3490 Malignant neoplasm of unspecified part of unspecified bronchus or lung: Secondary | ICD-10-CM

## 2021-03-02 MED ORDER — ERLOTINIB HCL 100 MG PO TABS: 100.0000 mg | ORAL_TABLET | Freq: Every day | ORAL | 3 refills | Status: DC

## 2021-03-03 DIAGNOSIS — M0630 Rheumatoid nodule, unspecified site: Secondary | ICD-10-CM | POA: Diagnosis not present

## 2021-03-03 DIAGNOSIS — M0609 Rheumatoid arthritis without rheumatoid factor, multiple sites: Secondary | ICD-10-CM | POA: Diagnosis not present

## 2021-03-03 DIAGNOSIS — M25531 Pain in right wrist: Secondary | ICD-10-CM | POA: Diagnosis not present

## 2021-03-03 DIAGNOSIS — A319 Mycobacterial infection, unspecified: Secondary | ICD-10-CM | POA: Diagnosis not present

## 2021-03-03 DIAGNOSIS — Z681 Body mass index (BMI) 19 or less, adult: Secondary | ICD-10-CM | POA: Diagnosis not present

## 2021-03-03 DIAGNOSIS — C3490 Malignant neoplasm of unspecified part of unspecified bronchus or lung: Secondary | ICD-10-CM | POA: Diagnosis not present

## 2021-03-13 ENCOUNTER — Ambulatory Visit (HOSPITAL_COMMUNITY)
Admission: RE | Admit: 2021-03-13 | Discharge: 2021-03-13 | Disposition: A | Discharge status: Home / Self Care | Payer: Medicare Other | Source: Ambulatory Visit | Attending: Internal Medicine | Admitting: Internal Medicine

## 2021-03-13 ENCOUNTER — Other Ambulatory Visit: Payer: Self-pay

## 2021-03-13 ENCOUNTER — Inpatient Hospital Stay: Payer: Medicare Other | Attending: Internal Medicine

## 2021-03-13 ENCOUNTER — Encounter (HOSPITAL_COMMUNITY): Payer: Self-pay

## 2021-03-13 DIAGNOSIS — J90 Pleural effusion, not elsewhere classified: Secondary | ICD-10-CM | POA: Diagnosis not present

## 2021-03-13 DIAGNOSIS — M129 Arthropathy, unspecified: Secondary | ICD-10-CM | POA: Insufficient documentation

## 2021-03-13 DIAGNOSIS — J439 Emphysema, unspecified: Secondary | ICD-10-CM | POA: Insufficient documentation

## 2021-03-13 DIAGNOSIS — C3490 Malignant neoplasm of unspecified part of unspecified bronchus or lung: Secondary | ICD-10-CM

## 2021-03-13 DIAGNOSIS — K219 Gastro-esophageal reflux disease without esophagitis: Secondary | ICD-10-CM | POA: Insufficient documentation

## 2021-03-13 DIAGNOSIS — Z87442 Personal history of urinary calculi: Secondary | ICD-10-CM | POA: Insufficient documentation

## 2021-03-13 DIAGNOSIS — M810 Age-related osteoporosis without current pathological fracture: Secondary | ICD-10-CM | POA: Insufficient documentation

## 2021-03-13 DIAGNOSIS — I10 Essential (primary) hypertension: Secondary | ICD-10-CM | POA: Diagnosis not present

## 2021-03-13 DIAGNOSIS — C342 Malignant neoplasm of middle lobe, bronchus or lung: Secondary | ICD-10-CM | POA: Insufficient documentation

## 2021-03-13 DIAGNOSIS — R21 Rash and other nonspecific skin eruption: Secondary | ICD-10-CM | POA: Insufficient documentation

## 2021-03-13 DIAGNOSIS — Z8719 Personal history of other diseases of the digestive system: Secondary | ICD-10-CM | POA: Diagnosis not present

## 2021-03-13 DIAGNOSIS — I2510 Atherosclerotic heart disease of native coronary artery without angina pectoris: Secondary | ICD-10-CM | POA: Diagnosis not present

## 2021-03-13 DIAGNOSIS — J449 Chronic obstructive pulmonary disease, unspecified: Secondary | ICD-10-CM | POA: Diagnosis not present

## 2021-03-13 LAB — CBC WITH DIFFERENTIAL (CANCER CENTER ONLY)
Abs Immature Granulocytes: 0.01 10*3/uL (ref 0.00–0.07)
Basophils Absolute: 0 10*3/uL (ref 0.0–0.1)
Basophils Relative: 0 %
Eosinophils Absolute: 0.2 10*3/uL (ref 0.0–0.5)
Eosinophils Relative: 3 %
HCT: 34.9 % — ABNORMAL LOW (ref 36.0–46.0)
Hemoglobin: 11.8 g/dL — ABNORMAL LOW (ref 12.0–15.0)
Immature Granulocytes: 0 %
Lymphocytes Relative: 14 %
Lymphs Abs: 0.8 10*3/uL (ref 0.7–4.0)
MCH: 31.5 pg (ref 26.0–34.0)
MCHC: 33.8 g/dL (ref 30.0–36.0)
MCV: 93.1 fL (ref 80.0–100.0)
Monocytes Absolute: 0.6 10*3/uL (ref 0.1–1.0)
Monocytes Relative: 9 %
Neutro Abs: 4.5 10*3/uL (ref 1.7–7.7)
Neutrophils Relative %: 74 %
Platelet Count: 274 10*3/uL (ref 150–400)
RBC: 3.75 MIL/uL — ABNORMAL LOW (ref 3.87–5.11)
RDW: 15.1 % (ref 11.5–15.5)
WBC Count: 6.1 10*3/uL (ref 4.0–10.5)
nRBC: 0 % (ref 0.0–0.2)

## 2021-03-13 LAB — CMP (CANCER CENTER ONLY)
ALT: 22 U/L (ref 0–44)
AST: 26 U/L (ref 15–41)
Albumin: 3.3 g/dL — ABNORMAL LOW (ref 3.5–5.0)
Alkaline Phosphatase: 50 U/L (ref 38–126)
Anion gap: 8 (ref 5–15)
BUN: 23 mg/dL (ref 8–23)
CO2: 25 mmol/L (ref 22–32)
Calcium: 9.6 mg/dL (ref 8.9–10.3)
Chloride: 106 mmol/L (ref 98–111)
Creatinine: 0.9 mg/dL (ref 0.44–1.00)
GFR, Estimated: >60 mL/min (ref >60)
Glucose, Bld: 81 mg/dL (ref 70–99)
Potassium: 4.3 mmol/L (ref 3.5–5.1)
Sodium: 139 mmol/L (ref 135–145)
Total Bilirubin: 1.2 mg/dL (ref 0.3–1.2)
Total Protein: 6 g/dL — ABNORMAL LOW (ref 6.5–8.1)

## 2021-03-13 MED ORDER — IOHEXOL 300 MG/ML  SOLN
75.0000 mL | Freq: Once | INTRAMUSCULAR | Status: AC | PRN
  Administered 2021-03-13: 75 mL via INTRAVENOUS

## 2021-03-15 ENCOUNTER — Encounter: Payer: Self-pay | Admitting: Internal Medicine

## 2021-03-15 ENCOUNTER — Other Ambulatory Visit: Payer: Self-pay

## 2021-03-15 ENCOUNTER — Telehealth: Payer: Self-pay | Admitting: Internal Medicine

## 2021-03-15 ENCOUNTER — Inpatient Hospital Stay: Payer: Medicare Other | Admitting: Internal Medicine

## 2021-03-15 VITALS — BP 147/84 | HR 99 | Temp 97.8°F | Resp 15 | Ht 64.0 in | Wt 110.7 lb

## 2021-03-15 DIAGNOSIS — M129 Arthropathy, unspecified: Secondary | ICD-10-CM | POA: Diagnosis not present

## 2021-03-15 DIAGNOSIS — C3491 Malignant neoplasm of unspecified part of right bronchus or lung: Secondary | ICD-10-CM

## 2021-03-15 DIAGNOSIS — M810 Age-related osteoporosis without current pathological fracture: Secondary | ICD-10-CM | POA: Diagnosis not present

## 2021-03-15 DIAGNOSIS — Z8719 Personal history of other diseases of the digestive system: Secondary | ICD-10-CM | POA: Diagnosis not present

## 2021-03-15 DIAGNOSIS — C342 Malignant neoplasm of middle lobe, bronchus or lung: Secondary | ICD-10-CM | POA: Diagnosis not present

## 2021-03-15 DIAGNOSIS — J449 Chronic obstructive pulmonary disease, unspecified: Secondary | ICD-10-CM | POA: Diagnosis not present

## 2021-03-15 DIAGNOSIS — K219 Gastro-esophageal reflux disease without esophagitis: Secondary | ICD-10-CM | POA: Diagnosis not present

## 2021-03-15 DIAGNOSIS — Z5181 Encounter for therapeutic drug level monitoring: Secondary | ICD-10-CM

## 2021-03-15 DIAGNOSIS — I10 Essential (primary) hypertension: Secondary | ICD-10-CM | POA: Diagnosis not present

## 2021-03-15 DIAGNOSIS — Z87442 Personal history of urinary calculi: Secondary | ICD-10-CM | POA: Diagnosis not present

## 2021-03-15 DIAGNOSIS — J439 Emphysema, unspecified: Secondary | ICD-10-CM | POA: Diagnosis not present

## 2021-03-15 DIAGNOSIS — R21 Rash and other nonspecific skin eruption: Secondary | ICD-10-CM | POA: Diagnosis not present

## 2021-03-15 NOTE — Progress Notes (Signed)
[facility] [phone]   [phone]) [phone]  OFFICE PROGRESS NOTE  [name], MD [place] [address]  DIAGNOSIS: Stage IIIB (T2a, N3, M0) non-small cell lung cancer, adenocarcinoma with positive EGFR mutation diagnosed in January 2010.  PRIOR THERAPY: 1) Status post concurrent chemoradiation with weekly carboplatin and paclitaxel; last dose given March 21, 2009.  2) Tarceva at 150 mg p.o. daily, status post approximately 48 months of treatment, discontinued secondary to persistent diarrhea.  3) status post right Pleurx catheter placement for right nonmalignant pleural effusion.  CURRENT THERAPY: Tarceva 100 mg by mouth daily started 03/19/2013, status post 96 months of treatment.  INTERVAL HISTORY: Dana [name] 86 y.o. female returns to the clinic today for follow-up visit.  The patient is feeling fine today with no concerning complaints except for skin rash after skin treatment by her dermatologist recently.  She denied having any current chest pain, shortness of breath, cough or hemoptysis.  She denied having any fever or chills.  She has no nausea, vomiting, diarrhea or constipation.  She has no headache or visual changes.  She gained few pounds since her last visit.  The patient is still very active and walk at least 1 mile every day.  She had repeat CT scan of the chest performed recently and she is here for evaluation and discussion of her scan results.   MEDICAL HISTORY: Past Medical History:  Diagnosis Date  . Arthritis    HANDS,  WRISTS  . COPD (chronic obstructive pulmonary disease) ([place])   . Depression 04/04/2017  . Dyspnea on exertion   . Encounter for therapeutic drug monitoring 10/01/2016  . GERD (gastroesophageal reflux disease)    at times  . Hemorrhoid   . History of anal fissures   . History of kidney stones   . History of lung cancer ONCOLOGIST--  [facility]--  LAST CT ,  NO RECURRENCE OR METS   DX JAN  2010 --  STAGE IIIA  NON-SMALL CELL ADENOCARCINOMA (RIGHT MIDDLE LOBE)---  S/P  CHEMORADIATIO THERAPY  (COMPLETE 03-21-2009)  . HTN (hypertension) 10/10/2017   white coat syndrome  . Imbalance 06/04/2017  . lung ca dx'd 2010  . Normal cardiac stress test    2007  PER PT  . Osteoporosis   . Rash, skin    RIGHT FOREARM/ HAND  . Right wrist pain    MASS  . Wears glasses     ALLERGIES:  is allergic to clindamycin hcl, amoxicillin, and protonix [pantoprazole sodium].  MEDICATIONS:  Current Outpatient Medications  Medication Sig Dispense Refill  . b complex vitamins capsule Take 1 capsule by mouth daily.    Marland Kitchen bismuth subsalicylate (PEPTO BISMOL) 262 MG/15ML suspension Take 30 mLs by mouth every three (3) days as needed.    . Calcium-Vitamin D ([employer] CALCIUM-600/VIT D PO) Take 2 tablets by mouth daily.    . cholecalciferol (VITAMIN D) 1000 UNITS tablet Take 2,000 Units by mouth daily.    . diclofenac (VOLTAREN) 75 MG EC tablet Take 75 mg by mouth 2 (two) times daily.    Marland Kitchen erlotinib (TARCEVA) 100 MG tablet Take 1 tablet (100 mg total) by mouth daily. 30 tablet 3  . lactase (LACTAID) 3000 units tablet Take 3,000 Units by mouth daily.    Marland Kitchen loratadine (CLARITIN) 10 MG tablet Take 10 mg by mouth daily as needed for allergies.    . meloxicam (MOBIC) 15 MG tablet Take 15 mg by  mouth daily as needed for pain.  4  . mirtazapine (REMERON) 15 MG tablet TAKE 1 TABLET BY MOUTH AT BEDTIME 30 tablet 0  . Misc Natural Products (OSTEO BI-FLEX JOINT SHIELD PO) Take 2 tablets by mouth daily.    Vladimir Faster Glycol-Propyl Glycol (SYSTANE OP) Place 1 drop into both eyes at bedtime as needed (for dry eyes.).    Marland Kitchen Simethicone (GAS-X PO) Take 1 tablet by mouth 2 (two) times daily as needed (for gas).     . STIOLTO RESPIMAT 2.5-2.5 MCG/ACT AERS INHALE 2 PUFFS BY MOUTH ONCE DAILY (Patient taking differently: Inhale 2 puffs into the lungs daily.) 4 g 6  . vitamin E 180 MG (400 UNITS) capsule Take 400 Units by mouth  daily.    . diclofenac Sodium (VOLTAREN) 1 % GEL Apply 2 g topically 4 (four) times daily as needed (pain). (Patient not taking: Reported on 03/15/2021)     No current facility-administered medications for this visit.    SURGICAL HISTORY:  Past Surgical History:  Procedure Laterality Date  . ANAL FISSURE REPAIR  07/09/2011   INTERNAL SPHINCTEROTOMY  . BENIGN EXCISION LEFT BREAST CENTRAL DUCT  02/1999  . BREAST BIOPSY  01/31/2009   benign  . BREAST EXCISIONAL BIOPSY Left 2004   benign  . CHEST TUBE INSERTION Right 07/14/2014   Procedure: INSERTION PLEURAL DRAINAGE CATHETER RIGHT CHEST;  Surgeon: Grace Isaac, MD;  Location: [facility];  Service: Thoracic;  Laterality: Right;  . EXCISION RIGHT WRIST MASS  2012  . EXTRACORPOREAL SHOCK WAVE LITHOTRIPSY Left 06/01/2019   Procedure: EXTRACORPOREAL SHOCK WAVE LITHOTRIPSY (ESWL);  Surgeon: Cleon Gustin, MD;  Location: [facility];  Service: Urology;  Laterality: Left;  . KNEE ARTHROSCOPY Right 1999  . MASS EXCISION Right 03/11/2014   Procedure: RIGHT WRIST DEEP MASS EXCISION WITH CULTURE AND BIOSPY;  Surgeon: Linna Hoff, MD;  Location: [facility];  Service: Orthopedics;  Laterality: Right;  . MICROLARYNGOSCOPY W/VOCAL CORD INJECTION Bilateral 02/08/2021   Procedure: MICROLARYNGOSCOPY WITH VOCAL CORD INJECTION;  Surgeon: Melida Quitter, MD;  Location: [facility];  Service: ENT;  Laterality: Bilateral;  . REMOVAL OF PLEURAL DRAINAGE CATHETER Right 10/14/2014   Procedure: REMOVAL OF PLEURAL DRAINAGE CATHETER;  Surgeon: Grace Isaac, MD;  Location: [facility];  Service: Thoracic;  Laterality: Right;  . TALC PLEURODESIS Right 09/16/2014   Procedure: TALC PLEURADESIS/ slurry;  Surgeon: Grace Isaac, MD;  Location: [facility];  Service: Thoracic;  Laterality: Right;  . THORACOSCOPY  01-26-2009   w/   lung/  node biopsy's  . THUMB ARTHROSCOPY Left    - removed bone spur  . TONSILLECTOMY  AS CHILD  . TOTAL ABDOMINAL HYSTERECTOMY W/ BILATERAL  SALPINGOOPHORECTOMY  1982   W/  APPENDECTOMY  . TRANSTHORACIC ECHOCARDIOGRAM  12-23-2008   NORMAL LV/  EF 65-70%/  MILD MR  &  TR  . VAULT SUSPENSION PLUS CYSTOCELE REPAIR WITH GRAFT  06-13-2010    REVIEW OF SYSTEMS:  Constitutional: positive for fatigue Eyes: negative Ears, nose, mouth, throat, and face: negative Respiratory: negative Cardiovascular: negative Gastrointestinal: negative Genitourinary:negative Integument/breast: positive for rash Hematologic/lymphatic: negative Musculoskeletal:negative Neurological: negative Behavioral/Psych: negative Endocrine: negative Allergic/Immunologic: negative   PHYSICAL EXAMINATION: General appearance: alert, cooperative, fatigued and no distress Head: Normocephalic, without obvious abnormality, atraumatic Neck: no adenopathy, no JVD, supple, symmetrical, trachea midline and thyroid not enlarged, symmetric, no tenderness/mass/nodules Lymph nodes: Cervical, supraclavicular, and axillary nodes normal. Resp: clear to auscultation bilaterally Back: symmetric, no curvature. ROM normal.  No CVA tenderness. Cardio: regular rate and rhythm, S1, S2 normal, no murmur, click, rub or gallop GI: soft, non-tender; bowel sounds normal; no masses,  no organomegaly Extremities: extremities normal, atraumatic, no cyanosis or edema Neurologic: Alert and oriented X 3, normal strength and tone. Normal symmetric reflexes. Normal coordination and gait  ECOG PERFORMANCE STATUS: 1 - Symptomatic but completely ambulatory  Blood pressure (!) 147/84, pulse 99, temperature 97.8 F (36.6 C), temperature source Tympanic, resp. rate 15, height $RemoveBe'5\' 4"'[id number]$  (1.626 m), weight 110 lb 11.2 oz (50.2 kg), SpO2 98 %.  LABORATORY DATA: Lab Results  Component Value Date   WBC 6.1 03/13/2021   HGB 11.8 (L) 03/13/2021   HCT 34.9 (L) 03/13/2021   MCV 93.1 03/13/2021   PLT 274 03/13/2021      Chemistry      Component Value Date/Time   NA 139 03/13/2021 1050   NA 142  12/10/2017 1000   K 4.3 03/13/2021 1050   K 4.0 12/10/2017 1000   CL 106 03/13/2021 1050   CL 108 (H) 06/03/2013 0939   CO2 25 03/13/2021 1050   CO2 28 12/10/2017 1000   BUN 23 03/13/2021 1050   BUN 20.4 12/10/2017 1000   CREATININE 0.90 03/13/2021 1050   CREATININE 0.9 12/10/2017 1000      Component Value Date/Time   CALCIUM 9.6 03/13/2021 1050   CALCIUM 9.7 12/10/2017 1000   ALKPHOS 50 03/13/2021 1050   ALKPHOS 63 12/10/2017 1000   AST 26 03/13/2021 1050   AST 25 12/10/2017 1000   ALT 22 03/13/2021 1050   ALT 17 12/10/2017 1000   BILITOT 1.2 03/13/2021 1050   BILITOT 1.05 12/10/2017 1000       RADIOGRAPHIC STUDIES: CT Chest W Contrast  Result Date: 03/13/2021 CLINICAL DATA:  Non small cell lung cancer in a 86 year old female, follow-up evaluation. EXAM: CT CHEST WITH CONTRAST TECHNIQUE: Multidetector CT imaging of the chest was performed during intravenous contrast administration. CONTRAST:  3mL OMNIPAQUE IOHEXOL 300 MG/ML  SOLN COMPARISON:  November 02, 2020 FINDINGS: Cardiovascular: Calcified and noncalcified atheromatous plaque in the thoracic aorta. No aneurysmal dilation. Central pulmonary vasculature with stable appearance. Distortion of RIGHT hilum secondary to post treatment changes and related soft tissue as before. Heart size is stable with signs of coronary artery calcification. Trace pericardial fluid perhaps slightly increased compared to previous imaging. Mediastinum/Nodes: No discrete mediastinal lymphadenopathy. Stable loss of normal fat density of mediastinal fat without discrete lesion. No thoracic inlet lymphadenopathy. No axillary lymphadenopathy. No hilar adenopathy. Confluent post treatment changes about the RIGHT hilum as described. Lungs/Pleura: Septal thickening in the RIGHT lower lobe with similar appearance. Signs of talc pleurodesis at the RIGHT lung base with associated small RIGHT-sided pleural effusion also similar. Nodular densities at the RIGHT lung  base over the RIGHT hemidiaphragm appear to be associated with scarring from prior talc pleurodesis. Stable nodule in the anterior LEFT upper lobe (image 62, series 5) 11 mm. Ground-glass nodule in the LEFT upper lobe with diminished conspicuity, less dense than on the prior study measuring 1.4 x 0.7 cm previously 1.5 x 0.8 cm. Airways are patent. Pulmonary emphysema as before. Upper Abdomen: Imaged portions of liver, gallbladder, spleen, pancreas, adrenal glands and kidneys are unremarkable. No acute gastrointestinal process to the extent evaluated in the upper abdomen. Musculoskeletal: Spinal degenerative changes. No acute musculoskeletal process. IMPRESSION: 1. Post treatment and postprocedural changes in the RIGHT chest as described. 2. Ground-glass nodule in the LEFT upper lobe with diminished conspicuity, less  dense than on the prior study. 3. Stable 11 mm nodule in the anterior LEFT upper lobe. 4. No new or progressive findings. 5. Pulmonary emphysema. 6. Coronary artery and aortic atherosclerosis. Aortic Atherosclerosis (ICD10-I70.0). Electronically Signed   By: Zetta Bills M.D.   On: 03/13/2021 22:46    ASSESSMENT AND PLAN:  This is a very pleasant 86 years old white female with stage IIIB non-small cell lung cancer, adenocarcinoma with positive EGFR mutation diagnosed in January 2010. She has been on treatment with Tarceva for more than 9 years.   She is currently on Tarceva 100 mg p.o. daily status post 96  months. The patient has been tolerating this treatment well with no concerning adverse effect except for intermittent skin rash. She had repeat CT scan of the chest performed recently.  I personally and independently reviewed the scan and discussed the results with the patient today.  Her scan showed no concerning findings for disease progression. I discussed with the patient again the option of discontinuation of her treatment with Tarceva and close monitoring versus staying on the same  regimen for now.  The patient mentions that she spoke to her family as well as some other friend and lung cancer advocates and she was advised by everyone to continue on her current treatment with Tarceva and she has no interest in discontinuing the treatment unless she has significant toxicity or disease progression. I recommended for her to continue her treatment with the same dose for now. I will see her back for follow-up visit in 2 months for evaluation and repeat blood work. For the hypertension, she was advised to take her blood pressure medication and to monitor it closely at home. The patient was advised to call immediately if she has any other concerning symptoms in the interval. The patient voices understanding of current disease status and treatment options and is in agreement with the current care plan. All questions were answered. The patient knows to call the clinic with any problems, questions or concerns. We can certainly see the patient much sooner if necessary. Disclaimer: This note was dictated with voice recognition software. Similar sounding words can inadvertently be transcribed and may not be corrected upon review.

## 2021-03-15 NOTE — Telephone Encounter (Signed)
Scheduled per los. Gave avs and calendar  

## 2021-03-16 DIAGNOSIS — L244 Irritant contact dermatitis due to drugs in contact with skin: Secondary | ICD-10-CM | POA: Diagnosis not present

## 2021-03-16 DIAGNOSIS — Z85828 Personal history of other malignant neoplasm of skin: Secondary | ICD-10-CM | POA: Diagnosis not present

## 2021-03-16 DIAGNOSIS — L308 Other specified dermatitis: Secondary | ICD-10-CM | POA: Diagnosis not present

## 2021-03-16 DIAGNOSIS — L570 Actinic keratosis: Secondary | ICD-10-CM | POA: Diagnosis not present

## 2021-03-23 DIAGNOSIS — Z79899 Other long term (current) drug therapy: Secondary | ICD-10-CM | POA: Diagnosis not present

## 2021-03-23 DIAGNOSIS — H02052 Trichiasis without entropian right lower eyelid: Secondary | ICD-10-CM | POA: Diagnosis not present

## 2021-03-23 DIAGNOSIS — H02054 Trichiasis without entropian left upper eyelid: Secondary | ICD-10-CM | POA: Diagnosis not present

## 2021-03-23 DIAGNOSIS — H02051 Trichiasis without entropian right upper eyelid: Secondary | ICD-10-CM | POA: Diagnosis not present

## 2021-03-23 DIAGNOSIS — H25811 Combined forms of age-related cataract, right eye: Secondary | ICD-10-CM | POA: Diagnosis not present

## 2021-03-23 DIAGNOSIS — H04123 Dry eye syndrome of bilateral lacrimal glands: Secondary | ICD-10-CM | POA: Diagnosis not present

## 2021-03-23 DIAGNOSIS — H353111 Nonexudative age-related macular degeneration, right eye, early dry stage: Secondary | ICD-10-CM | POA: Diagnosis not present

## 2021-04-10 ENCOUNTER — Ambulatory Visit (HOSPITAL_COMMUNITY)
Admission: RE | Admit: 2021-04-10 | Discharge: 2021-04-10 | Disposition: A | Discharge status: Home / Self Care | Payer: Medicare Other | Source: Ambulatory Visit | Attending: Internal Medicine | Admitting: Internal Medicine

## 2021-04-10 ENCOUNTER — Other Ambulatory Visit: Payer: Self-pay

## 2021-04-10 ENCOUNTER — Other Ambulatory Visit (HOSPITAL_COMMUNITY): Payer: Self-pay | Admitting: Internal Medicine

## 2021-04-10 ENCOUNTER — Encounter (HOSPITAL_BASED_OUTPATIENT_CLINIC_OR_DEPARTMENT_OTHER): Payer: Medicare Other | Attending: Internal Medicine | Admitting: Internal Medicine

## 2021-04-10 DIAGNOSIS — S41101D Unspecified open wound of right upper arm, subsequent encounter: Secondary | ICD-10-CM | POA: Diagnosis not present

## 2021-04-10 DIAGNOSIS — S61501S Unspecified open wound of right wrist, sequela: Secondary | ICD-10-CM | POA: Insufficient documentation

## 2021-04-10 DIAGNOSIS — I2510 Atherosclerotic heart disease of native coronary artery without angina pectoris: Secondary | ICD-10-CM | POA: Diagnosis not present

## 2021-04-10 DIAGNOSIS — S61501A Unspecified open wound of right wrist, initial encounter: Secondary | ICD-10-CM | POA: Diagnosis not present

## 2021-04-10 DIAGNOSIS — C3490 Malignant neoplasm of unspecified part of unspecified bronchus or lung: Secondary | ICD-10-CM | POA: Diagnosis not present

## 2021-04-10 DIAGNOSIS — L98499 Non-pressure chronic ulcer of skin of other sites with unspecified severity: Secondary | ICD-10-CM | POA: Diagnosis present

## 2021-04-10 DIAGNOSIS — X58XXXS Exposure to other specified factors, sequela: Secondary | ICD-10-CM | POA: Insufficient documentation

## 2021-04-10 DIAGNOSIS — L929 Granulomatous disorder of the skin and subcutaneous tissue, unspecified: Secondary | ICD-10-CM | POA: Diagnosis not present

## 2021-04-10 DIAGNOSIS — M7989 Other specified soft tissue disorders: Secondary | ICD-10-CM | POA: Diagnosis not present

## 2021-04-10 DIAGNOSIS — S61401A Unspecified open wound of right hand, initial encounter: Secondary | ICD-10-CM | POA: Diagnosis not present

## 2021-04-10 DIAGNOSIS — M19041 Primary osteoarthritis, right hand: Secondary | ICD-10-CM | POA: Diagnosis not present

## 2021-04-10 DIAGNOSIS — M05431 Rheumatoid myopathy with rheumatoid arthritis of right wrist: Secondary | ICD-10-CM | POA: Diagnosis not present

## 2021-04-10 DIAGNOSIS — J449 Chronic obstructive pulmonary disease, unspecified: Secondary | ICD-10-CM | POA: Diagnosis not present

## 2021-04-10 DIAGNOSIS — M06031 Rheumatoid arthritis without rheumatoid factor, right wrist: Secondary | ICD-10-CM | POA: Insufficient documentation

## 2021-04-10 DIAGNOSIS — E782 Mixed hyperlipidemia: Secondary | ICD-10-CM | POA: Diagnosis not present

## 2021-04-10 DIAGNOSIS — L98498 Non-pressure chronic ulcer of skin of other sites with other specified severity: Secondary | ICD-10-CM | POA: Diagnosis not present

## 2021-04-10 DIAGNOSIS — M25531 Pain in right wrist: Secondary | ICD-10-CM | POA: Diagnosis not present

## 2021-04-10 DIAGNOSIS — M810 Age-related osteoporosis without current pathological fracture: Secondary | ICD-10-CM | POA: Diagnosis not present

## 2021-04-10 DIAGNOSIS — K219 Gastro-esophageal reflux disease without esophagitis: Secondary | ICD-10-CM | POA: Diagnosis not present

## 2021-04-11 DIAGNOSIS — L98499 Non-pressure chronic ulcer of skin of other sites with unspecified severity: Secondary | ICD-10-CM | POA: Diagnosis not present

## 2021-04-11 DIAGNOSIS — Z85828 Personal history of other malignant neoplasm of skin: Secondary | ICD-10-CM | POA: Diagnosis not present

## 2021-04-11 DIAGNOSIS — R21 Rash and other nonspecific skin eruption: Secondary | ICD-10-CM | POA: Diagnosis not present

## 2021-04-11 DIAGNOSIS — S61501A Unspecified open wound of right wrist, initial encounter: Secondary | ICD-10-CM | POA: Diagnosis not present

## 2021-04-17 ENCOUNTER — Other Ambulatory Visit: Payer: Self-pay

## 2021-04-17 ENCOUNTER — Encounter (HOSPITAL_BASED_OUTPATIENT_CLINIC_OR_DEPARTMENT_OTHER): Payer: Medicare Other | Admitting: Internal Medicine

## 2021-04-17 DIAGNOSIS — L929 Granulomatous disorder of the skin and subcutaneous tissue, unspecified: Secondary | ICD-10-CM | POA: Diagnosis not present

## 2021-04-17 DIAGNOSIS — L98498 Non-pressure chronic ulcer of skin of other sites with other specified severity: Secondary | ICD-10-CM

## 2021-04-17 DIAGNOSIS — M06031 Rheumatoid arthritis without rheumatoid factor, right wrist: Secondary | ICD-10-CM | POA: Diagnosis not present

## 2021-04-17 DIAGNOSIS — S61501S Unspecified open wound of right wrist, sequela: Secondary | ICD-10-CM

## 2021-04-17 DIAGNOSIS — C3490 Malignant neoplasm of unspecified part of unspecified bronchus or lung: Secondary | ICD-10-CM | POA: Diagnosis not present

## 2021-04-17 NOTE — Progress Notes (Addendum)
KARAH, Dana [name] ([id number]) Visit Report for 04/17/2021 Arrival Information Details Patient Name: Date of Service: Dana [name], Dana [name] 04/17/2021 11:15 A M Medical Record Number: [id number] Patient Account Number: [id number] Date of Birth/Sex: Treating RN: 08-27-1935 (86 y.o. [name], Meta.Reding Primary Care Dervin Vore: Leitha Bleak Other Clinician: Referring Onisha Cedeno: Treating Latonya Nelon/Extender: Alean Rinne Weeks in Treatment: 1 Visit Information History Since Last Visit Added or deleted any medications: No Patient Arrived: Ambulatory Any new allergies or adverse reactions: No Arrival Time: 11:30 Had a fall or experienced change in No Accompanied By: self activities of daily living that may affect Transfer Assistance: None risk of falls: Patient Identification Verified: Yes Signs or symptoms of abuse/neglect since last visito No Secondary Verification Process Completed: Yes Hospitalized since last visit: No Patient Requires Transmission-Based Precautions: No Implantable device outside of the clinic excluding No Patient Has Alerts: No cellular tissue based products placed in the center since last visit: Has Dressing in Place as Prescribed: Yes Pain Present Now: No Notes Patient into day concerned about the periwound redness and edema. Per patient finished the prednisone yesterday and is currently taking the oral antibiotics doxycycline. Electronic Signature(s) Signed: 04/17/2021 4:05:30 PM By: Deon Pilling Entered By: Deon Pilling on 04/17/2021 11:45:00 -------------------------------------------------------------------------------- Clinic Level of Care Assessment Details Patient Name: Date of Service: SEQUOIA, WITZ 04/17/2021 11:15 A M Medical Record Number: [id number] Patient Account Number: [id number] Date of Birth/Sex: Treating RN: [birth year] (86 y.o. [name], Meta.Reding Primary Care Corleone Biegler: Leitha Bleak Other Clinician: Referring  Dawanna Grauberger: Treating Rett Stehlik/Extender: Alean Rinne Weeks in Treatment: 1 Clinic Level of Care Assessment Items TOOL 4 Quantity Score X- 1 0 Use when only an EandM is performed on FOLLOW-UP visit ASSESSMENTS - Nursing Assessment / Reassessment X- 1 10 Reassessment of Co-morbidities (includes updates in patient status) X- 1 5 Reassessment of Adherence to Treatment Plan ASSESSMENTS - Wound and Skin A ssessment / Reassessment []  - 0 Simple Wound Assessment / Reassessment - one wound X- 3 5 Complex Wound Assessment / Reassessment - multiple wounds X- 1 10 Dermatologic / Skin Assessment (not related to wound area) ASSESSMENTS - Focused Assessment []  - 0 Circumferential Edema Measurements - multi extremities X- 1 10 Nutritional Assessment / Counseling / Intervention []  - 0 Lower Extremity Assessment (monofilament, tuning fork, pulses) []  - 0 Peripheral Arterial Disease Assessment (using hand held doppler) ASSESSMENTS - Ostomy and/or Continence Assessment and Care []  - 0 Incontinence Assessment and Management []  - 0 Ostomy Care Assessment and Management (repouching, etc.) PROCESS - Coordination of Care []  - 0 Simple Patient / Family Education for ongoing care X- 1 20 Complex (extensive) Patient / Family Education for ongoing care X- 1 10 Staff obtains Programmer, systems, Records, T Results / Process Orders est X- 1 10 Staff telephones HHA, Nursing Homes / Clarify orders / etc []  - 0 Routine Transfer to another Facility (non-emergent condition) []  - 0 Routine Hospital Admission (non-emergent condition) []  - 0 New Admissions / Biomedical engineer / Ordering NPWT Apligraf, etc. , []  - 0 Emergency Hospital Admission (emergent condition) []  - 0 Simple Discharge Coordination X- 1 15 Complex (extensive) Discharge Coordination PROCESS - Special Needs []  - 0 Pediatric / Minor Patient Management []  - 0 Isolation Patient Management []  - 0 Hearing / Language  / Visual special needs []  - 0 Assessment of Community assistance (transportation, D/C planning, etc.) []  - 0 Additional assistance / Altered mentation []  - 0 Support Surface(s) Assessment (bed, cushion,  seat, etc.) INTERVENTIONS - Wound Cleansing / Measurement []  - 0 Simple Wound Cleansing - one wound X- 3 5 Complex Wound Cleansing - multiple wounds X- 1 5 Wound Imaging (photographs - any number of wounds) []  - 0 Wound Tracing (instead of photographs) []  - 0 Simple Wound Measurement - one wound X- 3 5 Complex Wound Measurement - multiple wounds INTERVENTIONS - Wound Dressings X - Small Wound Dressing one or multiple wounds 1 10 []  - 0 Medium Wound Dressing one or multiple wounds []  - 0 Large Wound Dressing one or multiple wounds []  - 0 Application of Medications - topical []  - 0 Application of Medications - injection INTERVENTIONS - Miscellaneous []  - 0 External ear exam []  - 0 Specimen Collection (cultures, biopsies, blood, body fluids, etc.) []  - 0 Specimen(s) / Culture(s) sent or taken to Lab for analysis []  - 0 Patient Transfer (multiple staff / Civil Service fast streamer / Similar devices) []  - 0 Simple Staple / Suture removal (25 or less) []  - 0 Complex Staple / Suture removal (26 or more) []  - 0 Hypo / Hyperglycemic Management (close monitor of Blood Glucose) []  - 0 Ankle / Brachial Index (ABI) - do not check if billed separately X- 1 5 Vital Signs Has the patient been seen at the hospital within the last three years: Yes Total Score: 155 Level Of Care: New/Established - Level 4 Electronic Signature(s) Signed: 04/17/2021 4:05:30 PM By: Deon Pilling Entered By: Deon Pilling on 04/17/2021 11:47:36 -------------------------------------------------------------------------------- Encounter Discharge Information Details Patient Name: Date of Service: Dana [name] 04/17/2021 11:15 A M Medical Record Number: [id number] Patient Account Number: [id number] Date of  Birth/Sex: Treating RN: Jun 24, 1935 (86 y.o. [name], Tammi Klippel Primary Care Evola Hollis: Leitha Bleak Other Clinician: Referring Cai Anfinson: Treating Alfie Rideaux/Extender: Alean Rinne Weeks in Treatment: 1 Encounter Discharge Information Items Discharge Condition: Stable Ambulatory Status: Ambulatory Discharge Destination: Home Transportation: Private Auto Accompanied By: self Schedule Follow-up Appointment: Yes Clinical Summary of Care: Electronic Signature(s) Signed: 04/17/2021 4:05:30 PM By: Deon Pilling Entered By: Deon Pilling on 04/17/2021 11:48:14 -------------------------------------------------------------------------------- Lower Extremity Assessment Details Patient Name: Date of Service: Dana [name], Dana [name] 04/17/2021 11:15 A M Medical Record Number: [id number] Patient Account Number: [id number] Date of Birth/Sex: Treating RN: 02/11/1935 (86 y.o. [name] Primary Care Jenny Omdahl: Leitha Bleak Other Clinician: Referring Beth Goodlin: Treating Faustino Luecke/Extender: Alean Rinne Weeks in Treatment: 1 Electronic Signature(s) Signed: 04/17/2021 4:05:30 PM By: Deon Pilling Entered By: Deon Pilling on 04/17/2021 11:45:35 -------------------------------------------------------------------------------- Multi Wound Chart Details Patient Name: Date of Service: Dana [name], Dana [name] 04/17/2021 11:15 A M Medical Record Number: [id number] Patient Account Number: [id number] Date of Birth/Sex: Treating RN: Jan 25, 1935 (86 y.o. Martyn Malay, Vaughan Basta Primary Care Roderic Lammert: Leitha Bleak Other Clinician: Referring Marketta Valadez: Treating Kalief Kattner/Extender: Alean Rinne Weeks in Treatment: 1 Vital Signs Height(in): 82 Pulse(bpm): 94 Weight(lbs): 110 Blood Pressure(mmHg): 141/73 Body Mass Index(BMI): 19 Temperature(F): 97.6 Respiratory Rate(breaths/min): 20 Photos: [1:No Photos Right, Dorsal Wrist] [2:No Photos Right Hand -  Dorsum] [3:No Photos Right, Lateral Hand - Dorsum] Wound Location: [1:Gradually Appeared] [2:Gradually Appeared] [3:Gradually Appeared] Wounding Event: [1:Atypical] [2:Atypical] [3:Atypical] Primary Etiology: [1:Chronic Obstructive Pulmonary] [2:N/A] [3:Chronic Obstructive Pulmonary] Comorbid History: [1:Disease (COPD), Hypertension, Rheumatoid Arthritis, Received Chemotherapy, Received Radiation 04/06/2021] [2:04/04/2021] [3:Disease (COPD), Hypertension, Rheumatoid Arthritis, Received Chemotherapy, Received Radiation 04/07/2021] Date Acquired: [1:1] [2:1] [3:1] Weeks of Treatment: [1:Open] [2:Open] [3:Open] Wound Status: [1:2.7x3x0.3] [2:0x0x0] [3:0.8x0.5x0.1] Measurements L x W x D (cm) [1:6.362] [2:0] [3:0.314] A (  cm) : rea [1:1.909] [2:0] [3:0.031] Volume (cm) : [1:-141.10%] [2:100.00%] [3:0.00%] % Reduction in Area: [1:-623.10%] [2:100.00%] [3:0.00%] % Reduction in Volume: [1:Full Thickness Without Exposed] [2:Full Thickness Without Exposed] [3:Full Thickness Without Exposed] Classification: [1:Support Structures Medium] [2:Support Structures N/A] [3:Support Structures Medium] Exudate Amount: [1:Serosanguineous] [2:N/A] [3:Serosanguineous] Exudate Type: [1:red, brown] [2:N/A] [3:red, brown] Exudate Color: [1:Distinct, outline attached] [2:N/A] [3:Distinct, outline attached] Wound Margin: [1:Small (1-33%)] [2:N/A] [3:Large (67-100%)] Granulation Amount: [1:Red, Pink] [2:N/A] [3:Red, Pink] Granulation Quality: [1:Large (67-100%)] [2:N/A] [3:Small (1-33%)] Necrotic Amount: [1:Fat Layer (Subcutaneous Tissue): Yes N/A] [3:Fat Layer (Subcutaneous Tissue): Yes] Exposed Structures: [1:Fascia: No Tendon: No Muscle: No Joint: No Bone: No Small (1-33%)] [2:N/A] [3:Fascia: No Tendon: No Muscle: No Joint: No Bone: No None] Treatment Notes Wound #1 (Wrist) Wound Laterality: Dorsal, Right Cleanser Wound Cleanser Discharge Instruction: Cleanse the wound with wound cleanser prior to applying a clean  dressing using gauze sponges, not tissue or cotton balls. Peri-Wound Care Topical Primary Dressing MediHoney Calcium Alginate Dressing 4x5 in Discharge Instruction: Apply to wound bed as instructed Secondary Dressing Woven Gauze Sponge, Non-Sterile 4x4 in Discharge Instruction: Apply over primary dressing as directed. Secured With [employer], 4.5x3.1 (in/yd) Discharge Instruction: Secure with Kerlix as directed. Paper Tape, 1x10 (in/yd) Discharge Instruction: Secure dressing with tape as directed. Compression Wrap Compression Stockings Add-Ons Wound #2 (Hand - Dorsum) Wound Laterality: Right Cleanser Peri-Wound Care Topical Primary Dressing Secondary Dressing Secured With Compression Wrap Compression Stockings Add-Ons Wound #3 (Hand - Dorsum) Wound Laterality: Right, Lateral Cleanser Wound Cleanser Discharge Instruction: Cleanse the wound with wound cleanser prior to applying a clean dressing using gauze sponges, not tissue or cotton balls. Peri-Wound Care Topical Primary Dressing Xeroform Occlusive Gauze Dressing, 4x4 in Discharge Instruction: Apply to wound bed as instructed Secondary Dressing Woven Gauze Sponge, Non-Sterile 4x4 in Discharge Instruction: Apply over primary dressing as directed. Secured With [employer], 4.5x3.1 (in/yd) Discharge Instruction: Secure with Kerlix as directed. Paper Tape, 1x10 (in/yd) Discharge Instruction: Secure dressing with tape as directed. Compression Wrap Compression Stockings Add-Ons Electronic Signature(s) Signed: 04/17/2021 2:23:27 PM By: Kalman Shan DO Signed: 04/17/2021 4:57:45 PM By: Baruch Gouty RN, BSN Entered By: Kalman Shan on 04/17/2021 14:21:01 -------------------------------------------------------------------------------- Multi-Disciplinary Care Plan Details Patient Name: Date of Service: MAKELLE, MARRONE 04/17/2021 11:15 A M Medical Record Number: [id number] Patient Account  Number: [id number] Date of Birth/Sex: Treating RN: [birth year] (86 y.o. [name], Tammi Klippel Primary Care Delsa Walder: Leitha Bleak Other Clinician: Referring Timohty Renbarger: Treating Havah Ammon/Extender: Alean Rinne Weeks in Treatment: 1 [place] reviewed with physician Active Inactive Wound/Skin Impairment Nursing Diagnoses: Impaired tissue integrity Knowledge deficit related to ulceration/compromised skin integrity Goals: Patient/caregiver will verbalize understanding of skin care regimen Date Initiated: 04/10/2021 Target Resolution Date: 05/12/2021 Goal Status: Active Ulcer/skin breakdown will have a volume reduction of 30% by week 4 Date Initiated: 04/10/2021 Target Resolution Date: 05/12/2021 Goal Status: Active Interventions: Assess patient/caregiver ability to obtain necessary supplies Assess patient/caregiver ability to perform ulcer/skin care regimen upon admission and as needed Assess ulceration(s) every visit Provide education on ulcer and skin care Notes: Electronic Signature(s) Signed: 04/17/2021 4:05:30 PM By: Deon Pilling Entered By: Deon Pilling on 04/17/2021 11:45:54 -------------------------------------------------------------------------------- Pain Assessment Details Patient Name: Date of Service: Dana [name], Dana [name] 04/17/2021 11:15 A M Medical Record Number: [id number] Patient Account Number: [id number] Date of Birth/Sex: Treating RN: 16-Oct-1935 (86 y.o. [name], Tammi Klippel Primary Care Arin Vanosdol: Leitha Bleak Other Clinician: Referring Blu Lori: Treating Antionetta Ator/Extender: Alean Rinne  Weeks in Treatment: 1 Active Problems Location of Pain Severity and Description of Pain Patient Has Paino No Site Locations Rate the pain. Current Pain Level: 0 Pain Management and Medication Current Pain Management: Medication: No Cold Application: No Rest: No Massage: No Activity: No T.E.N.S.: No Heat  Application: No Leg drop or elevation: No Is the Current Pain Management Adequate: Adequate How does your wound impact your activities of daily livingo Sleep: No Bathing: No Appetite: No Relationship With Others: No Bladder Continence: No Emotions: No Bowel Continence: No Work: No Toileting: No Drive: No Dressing: No Hobbies: No Electronic Signature(s) Signed: 04/17/2021 4:05:30 PM By: Deon Pilling Entered By: Deon Pilling on 04/17/2021 11:45:30 -------------------------------------------------------------------------------- Patient/Caregiver Education Details Patient Name: Date of Service: Dana [name] 4/25/2022andnbsp11:15 A M Medical Record Number: [id number] Patient Account Number: [id number] Date of Birth/Gender: Treating RN: 1935-05-14 (86 y.o. [name], Tammi Klippel Primary Care Physician: Leitha Bleak Other Clinician: Referring Physician: Treating Physician/Extender: Alean Rinne Weeks in Treatment: 1 Education Assessment Education Provided To: Patient Education Topics Provided Wound/Skin Impairment: Handouts: Skin Care Do's and Dont's Methods: Explain/Verbal Responses: Reinforcements needed Electronic Signature(s) Signed: 04/17/2021 4:05:30 PM By: Deon Pilling Entered By: Deon Pilling on 04/17/2021 11:46:05 -------------------------------------------------------------------------------- Wound Assessment Details Patient Name: Date of Service: Dana [name], Dana [name] 04/17/2021 11:15 A M Medical Record Number: [id number] Patient Account Number: [id number] Date of Birth/Sex: Treating RN: [birth year] (86 y.o. [name], Meta.Reding Primary Care Jaicey Sweaney: Leitha Bleak Other Clinician: Referring Candace Begue: Treating Shaquinta Peruski/Extender: Alean Rinne Weeks in Treatment: 1 Wound Status Wound Number: 1 Primary Atypical Etiology: Wound Location: Right, Dorsal Wrist Wound Open Wounding Event: Gradually Appeared Status: Date  Acquired: 04/06/2021 Comorbid Chronic Obstructive Pulmonary Disease (COPD), Hypertension, Weeks Of Treatment: 1 Weeks Of Treatment: 1 History: Rheumatoid Arthritis, Received Chemotherapy, Received Radiation Clustered Wound: No Photos Wound Measurements Length: (cm) 2.7 Width: (cm) 3 Depth: (cm) 0.3 Area: (cm) 6.362 Volume: (cm) 1.909 % Reduction in Area: -141.1% % Reduction in Volume: -623.1% Epithelialization: Small (1-33%) Tunneling: No Undermining: No Wound Description Classification: Full Thickness Without Exposed Support Structures Wound Margin: Distinct, outline attached Exudate Amount: Medium Exudate Type: Serosanguineous Exudate Color: red, brown Foul Odor After Cleansing: No Slough/Fibrino Yes Wound Bed Granulation Amount: Small (1-33%) Exposed Structure Granulation Quality: Red, Pink Fascia Exposed: No Necrotic Amount: Large (67-100%) Fat Layer (Subcutaneous Tissue) Exposed: Yes Necrotic Quality: Adherent Slough Tendon Exposed: No Muscle Exposed: No Joint Exposed: No Bone Exposed: No Treatment Notes Wound #1 (Wrist) Wound Laterality: Dorsal, Right Cleanser Wound Cleanser Discharge Instruction: Cleanse the wound with wound cleanser prior to applying a clean dressing using gauze sponges, not tissue or cotton balls. Peri-Wound Care Topical Primary Dressing MediHoney Calcium Alginate Dressing 4x5 in Discharge Instruction: Apply to wound bed as instructed Secondary Dressing Woven Gauze Sponge, Non-Sterile 4x4 in Discharge Instruction: Apply over primary dressing as directed. Secured With [employer], 4.5x3.1 (in/yd) Discharge Instruction: Secure with Kerlix as directed. Paper Tape, 1x10 (in/yd) Discharge Instruction: Secure dressing with tape as directed. Compression Wrap Compression Stockings Add-Ons Electronic Signature(s) Signed: 04/20/2021 6:31:57 PM By: Deon Pilling Signed: 04/21/2021 4:35:52 PM By: Sandre Kitty Previous Signature:  04/17/2021 4:05:30 PM Version By: Deon Pilling Entered By: Sandre Kitty on 04/20/2021 16:54:21 -------------------------------------------------------------------------------- Wound Assessment Details Patient Name: Date of Service: Dana [name], Dana [name] 04/17/2021 11:15 A M Medical Record Number: [id number] Patient Account Number: [id number] Date of Birth/Sex: Treating RN: 11/17/1935 (86 y.o. [name] Primary Care Jamorris Ndiaye: Leitha Bleak Other  Clinician: Referring Ky Rumple: Treating Theodus Ran/Extender: Alean Rinne Weeks in Treatment: 1 Wound Status Wound Number: 2 Primary Etiology: Atypical Wound Location: Right Hand - Dorsum Wound Status: Open Wounding Event: Gradually Appeared Date Acquired: 04/04/2021 Weeks Of Treatment: 1 Clustered Wound: No Wound Measurements Length: (cm) Width: (cm) Depth: (cm) Area: (cm) Volume: (cm) 0 % Reduction in Area: 100% 0 % Reduction in Volume: 100% 0 0 0 Wound Description Classification: Full Thickness Without Exposed Support Structur es Electronic Signature(s) Signed: 04/17/2021 4:05:30 PM By: Deon Pilling Entered By: Deon Pilling on 04/17/2021 11:29:12 -------------------------------------------------------------------------------- Wound Assessment Details Patient Name: Date of Service: Dana [name], Dana [name] 04/17/2021 11:15 A M Medical Record Number: [id number] Patient Account Number: [id number] Date of Birth/Sex: Treating RN: 08-31-1935 (86 y.o. [name], Meta.Reding Primary Care Curlie Sittner: Leitha Bleak Other Clinician: Referring Lourdes Manning: Treating Makalya Nave/Extender: Alean Rinne Weeks in Treatment: 1 Wound Status Wound Number: 3 Primary Atypical Etiology: Wound Location: Right, Lateral Hand - Dorsum Wound Open Wounding Event: Gradually Appeared Status: Date Acquired: 04/07/2021 Comorbid Chronic Obstructive Pulmonary Disease (COPD), Hypertension, Weeks Of Treatment:  1 History: Rheumatoid Arthritis, Received Chemotherapy, Received Radiation History: Rheumatoid Arthritis, Received Chemotherapy, Received Radiation Clustered Wound: No Wound Measurements Length: (cm) 0.8 Width: (cm) 0.5 Depth: (cm) 0.1 Area: (cm) 0.314 Volume: (cm) 0.031 % Reduction in Area: 0% % Reduction in Volume: 0% Epithelialization: None Tunneling: No Undermining: No Wound Description Classification: Full Thickness Without Exposed Support Structures Wound Margin: Distinct, outline attached Exudate Amount: Medium Exudate Type: Serosanguineous Exudate Color: red, brown Foul Odor After Cleansing: No Slough/Fibrino Yes Wound Bed Granulation Amount: Large (67-100%) Exposed Structure Granulation Quality: Red, Pink Fascia Exposed: No Necrotic Amount: Small (1-33%) Fat Layer (Subcutaneous Tissue) Exposed: Yes Necrotic Quality: Adherent Slough Tendon Exposed: No Muscle Exposed: No Joint Exposed: No Bone Exposed: No Electronic Signature(s) Signed: 04/17/2021 4:05:30 PM By: Deon Pilling Entered By: Deon Pilling on 04/17/2021 11:30:12 -------------------------------------------------------------------------------- Vitals Details Patient Name: Date of Service: Dana [name] 04/17/2021 11:15 A M Medical Record Number: [id number] Patient Account Number: [id number] Date of Birth/Sex: Treating RN: 09-27-1935 (86 y.o. [name], Meta.Reding Primary Care Lisabeth Mian: Leitha Bleak Other Clinician: Referring Jimia Gentles: Treating Ranita Stjulien/Extender: Alean Rinne Weeks in Treatment: 1 Vital Signs Time Taken: 11:30 Temperature (F): 97.6 Height (in): 64 Pulse (bpm): 94 Weight (lbs): 110 Respiratory Rate (breaths/min): 20 Body Mass Index (BMI): 18.9 Blood Pressure (mmHg): 141/73 Reference Range: 80 - 120 mg / dl Electronic Signature(s) Signed: 04/17/2021 4:05:30 PM By: Deon Pilling Entered By: Deon Pilling on 04/17/2021 11:45:20

## 2021-04-17 NOTE — Progress Notes (Signed)
Dana [name] ([id number]) Visit Report for 04/17/2021 Chief Complaint Document Details Patient Name: Date of Service: Dana [name], Dana [name] 04/17/2021 11:15 A M Medical Record Number: [id number] Patient Account Number: [id number] Date of Birth/Sex: Treating RN: 1935-06-29 (86 y.o. Dana [name]: Dana [name] Other Clinician: Referring [name]: Treating [name]/Extender: Dana [name] in Treatment: 1 Information Obtained from: Patient Chief Complaint 04/10/2021; patient is here for review of a wound on the right medial wrist and arm. Electronic Signature(s) Signed: 04/17/2021 2:23:27 PM By: Dana [name] Entered By: Dana Shan on 04/17/2021 14:21:09 -------------------------------------------------------------------------------- HPI Details Patient Name: Date of Service: Dana [name] 04/17/2021 11:15 A M Medical Record Number: [id number] Patient Account Number: [id number] Date of Birth/Sex: Treating RN: 11-12-1935 (87 y.o. Dana [name]: Dana [name] Other Clinician: Referring [name]: Treating [name]/Extender: Dana [name] in Treatment: 1 History of Present Illness HPI Description: ADMISSION 04/10/2021 This is a 86 year old woman who was worked in urgently today at the request of her family. Apparently they saw an area on her right medial wrist at roughly the head of the ulna. This came on about 5 days ago the patient describes a small spot that is rapidly expanded into a wound with a necrotic surface. She has smaller areas on the medial aspect of the arm and also the medial aspect of the hand however they [name] not have been necrotic surfaces. She does not describe a lot of pain. The patient has a long history of problems in this wrist and hand. This was extensively worked up by Dana [name] at [facility] in 2018 with multiple biopsies diagnosed granulomatous  dermatitis. Cultures were negative for fungus and Mycobacterium at that point she was started on methotrexate. The patient has not been back there since she is followed instead with Dr. Fontaine [name] at [facility] dermatology. Patient states that she has had wounds in this area that look like this before and she will be prescribed cream and it will eventually get better. Past medical history has very little. She follows with Dana [name] at [name] for stage IIIb non-small cell lung cancer. She has had this for several years. She is currently on T arceva which appears to have kept this under control. She also apparently has seronegative rheumatoid arthritis which is felt to be affecting her right wrist she follows with rheumatology. 4/25; patient presents as a nurse visit but is concerned about the surrounding erythema to the wound and would like to be seen by a physician. Patient states that she was recently seen by dermatology for her atypical wound and started on doxycycline and prednisone. She states that once she stopped the prednisone the erythema returned to the surrounding wound bed. Patient states that this has been an ongoing issue for the past 7 years. She states the wound heals but then opened spontaneously. It spontaneously opened about 2 [name] ago. She denies any drainage, fever/chills or increased warmth to the wound site. Electronic Signature(s) Signed: 04/17/2021 2:23:27 PM By: Dana [name] Entered By: Dana Shan on 04/17/2021 13:40:21 -------------------------------------------------------------------------------- Physical Exam Details Patient Name: Date of Service: Dana [name], Dana [name] 04/17/2021 11:15 A M Medical Record Number: [id number] Patient Account Number: [id number] Date of Birth/Sex: Treating RN: 1935/10/23 (86 y.o. Dana [name]: Dana [name] Other Clinician: Referring [name]: Treating [name]/Extender: Dana [name] in Treatment: 1 Constitutional respirations regular, non-labored and within target range  for patient.Marland Kitchen Psychiatric pleasant and cooperative. Notes Radial pulses palpable. Right lower extremity: There is a wound to the medial part of the right wrist this is mostly fibrotic tissue. There is a ring of erythema surrounding the wound. There is [name] purulent drainage increased warmth or tenderness to the wound site. Electronic Signature(s) Signed: 04/17/2021 2:23:27 PM By: Dana [name] Entered By: Dana Shan on 04/17/2021 13:42:35 -------------------------------------------------------------------------------- Physician Orders Details Patient Name: Date of Service: [name], STUMP 04/17/2021 11:15 A M Medical Record Number: [id number] Patient Account Number: [id number] Date of Birth/Sex: Treating RN: [birth year] (86 y.o. [name], Meta.Reding Primary Care [name]: Dana [name] Other Clinician: Referring [name]: Treating [name]/Extender: Dana [name] in Treatment: 1 Verbal / Phone Orders: [name] Diagnosis Coding ICD-10 Coding Code Description L98.498 Non-pressure chronic ulcer of skin of other sites with other specified severity S61.501S Unspecified open wound of right wrist, sequela L92.9 Granulomatous disorder of the skin and subcutaneous tissue, unspecified M06.031 Rheumatoid arthritis without rheumatoid factor, right wrist Follow-up Appointments ppointment in: - Thursday 04/20/2021 Return A Bathing/ Shower/ Hygiene May shower with protection but [name] not get wound dressing(s) wet. Wound Treatment Wound #1 - Wrist Wound Laterality: Dorsal, Right Cleanser: Wound Cleanser (Generic) 1 x Per Day/15 Days Discharge Instructions: Cleanse the wound with wound cleanser prior to applying a clean dressing using gauze sponges, not tissue or cotton balls. Prim Dressing: MediHoney Calcium Alginate Dressing 4x5 in 1 x Per Day/15  Days ary Discharge Instructions: Apply to wound bed as instructed Patient Medications llergies: amoxicillin, Protonix A Notifications Medication Indication Start End 04/17/2021 prednisone DOSE 2 - oral 20 mg tablet - 2 tablet oral, daily for the next 4 days Electronic Signature(s) Signed: 04/17/2021 2:23:27 PM By: Dana [name] Previous Signature: 04/17/2021 11:54:10 AM Version By: Dana [name] Entered By: Dana Shan on 04/17/2021 13:42:58 -------------------------------------------------------------------------------- Problem List Details Patient Name: Date of Service: Linnell Fulling 04/17/2021 11:15 A M Medical Record Number: [id number] Patient Account Number: [id number] Date of Birth/Sex: Treating RN: 12-25-1934 (86 y.o. [name], Tammi Klippel Primary Care [name]: Dana [name] Other Clinician: Referring [name]: Treating [name]/Extender: Dana [name] in Treatment: 1 Active Problems ICD-10 Encounter Code Description Active Date MDM Diagnosis L98.498 Non-pressure chronic ulcer of skin of other sites with other specified severity 04/10/2021 [name] Yes S61.501S Unspecified open wound of right wrist, sequela 04/10/2021 [name] Yes L92.9 Granulomatous disorder of the skin and subcutaneous tissue, unspecified 04/10/2021 [name] Yes M06.031 Rheumatoid arthritis without rheumatoid factor, right wrist 04/10/2021 [name] Yes Inactive Problems Resolved Problems Electronic Signature(s) Signed: 04/17/2021 2:23:27 PM By: Dana [name] Entered By: Dana Shan on 04/17/2021 14:20:55 -------------------------------------------------------------------------------- Progress Note Details Patient Name: Date of Service: Linnell Fulling 04/17/2021 11:15 A M Medical Record Number: [id number] Patient Account Number: [id number] Date of Birth/Sex: Treating RN: 11/27/1935 (86 y.o. Dana [name]: Other Clinician: Leitha [name] Referring [name]: Treating [name]/Extender: Dana [name] in Treatment: 1 Subjective Chief Complaint Information obtained from Patient 04/10/2021; patient is here for review of a wound on the right medial wrist and arm. History of Present Illness (HPI) ADMISSION 04/10/2021 This is a 86 year old woman who was worked in urgently today at the request of her family. Apparently they saw an area on her right medial wrist at roughly the head of the ulna. This came on about 5 days ago the patient describes a small spot that is rapidly expanded into a  wound with a necrotic surface. She has smaller areas on the medial aspect of the arm and also the medial aspect of the hand however they [name] not have been necrotic surfaces. She does not describe a lot of pain. The patient has a long history of problems in this wrist and hand. This was extensively worked up by Dana [name] at [facility] in 2018 with multiple biopsies diagnosed granulomatous dermatitis. Cultures were negative for fungus and Mycobacterium at that point she was started on methotrexate. The patient has not been back there since she is followed instead with Dr. Fontaine [name] at [facility] dermatology. Patient states that she has had wounds in this area that look like this before and she will be prescribed cream and it will eventually get better. Past medical history has very little. She follows with Dana [name] at [name] for stage IIIb non-small cell lung cancer. She has had this for several years. She is currently on T arceva which appears to have kept this under control. She also apparently has seronegative rheumatoid arthritis which is felt to be affecting her right wrist she follows with rheumatology. 4/25; patient presents as a nurse visit but is concerned about the surrounding erythema to the wound and would like to be seen by a physician. Patient states that she was recently seen by dermatology for her atypical  wound and started on doxycycline and prednisone. She states that once she stopped the prednisone the erythema returned to the surrounding wound bed. Patient states that this has been an ongoing issue for the past 7 years. She states the wound heals but then opened spontaneously. It spontaneously opened about 2 [name] ago. She denies any drainage, fever/chills or increased warmth to the wound site. Patient History Information obtained from Patient. Family History Unknown History. Social History Former smoker, Marital Status - Married, Alcohol Use - Never, Drug Use - [name] History, Caffeine Use - Rarely. Medical History Eyes Denies history of Cataracts, Glaucoma, Optic Neuritis Ear/Nose/Mouth/Throat Denies history of Chronic sinus problems/congestion, Middle ear problems Hematologic/Lymphatic Denies history of Anemia, Hemophilia, Human Immunodeficiency Virus, Lymphedema, Sickle Cell Disease Respiratory Patient has history of Chronic Obstructive Pulmonary Disease (COPD) Denies history of Aspiration, Asthma, Pneumothorax, Sleep Apnea, Tuberculosis Cardiovascular Patient has history of Hypertension Denies history of Angina, Arrhythmia, Congestive Heart Failure, Coronary Artery Disease, Deep Vein Thrombosis, Hypotension, Myocardial Infarction, Peripheral Arterial Disease, Peripheral Venous Disease, Phlebitis, Vasculitis Gastrointestinal Denies history of Cirrhosis , Colitis, Crohnoos, Hepatitis A, Hepatitis B, Hepatitis C Endocrine Denies history of Type I Diabetes, Type II Diabetes Genitourinary Denies history of End Stage Renal Disease Immunological Denies history of Lupus Erythematosus, Raynaudoos, Scleroderma Integumentary (Skin) Denies history of History of Burn Musculoskeletal Patient has history of Rheumatoid Arthritis Denies history of Gout, Osteoarthritis, Osteomyelitis Neurologic Denies history of Dementia, Neuropathy, Quadriplegia, Paraplegia, Seizure  Disorder Oncologic Patient has history of Received Chemotherapy - received 12 years ago, Received Radiation - received 12 years ago Psychiatric Denies history of Anorexia/bulimia, Confinement Anxiety Hospitalization/Surgery History - talc pleurodesis. - mass excision right wrist with culture and biopsy. - anal fissure repair. - microlaryngoscopy. - extracorporeal shock wave llithotripsy. Medical A Surgical History Notes nd Gastrointestinal GERD Musculoskeletal osteoporosis Oncologic Tarceva started in April Objective Constitutional respirations regular, non-labored and within target range for patient.. Vitals Time Taken: 11:30 AM, Height: 64 in, Weight: 110 lbs, BMI: 18.9, Temperature: 97.6 F, Pulse: 94 bpm, Respiratory Rate: 20 breaths/min, Blood Pressure: 141/73 mmHg. Psychiatric pleasant and cooperative. General Notes: Radial pulses palpable. Right lower extremity: There is  a wound to the medial part of the right wrist this is mostly fibrotic tissue. There is a ring of erythema surrounding the wound. There is [name] purulent drainage increased warmth or tenderness to the wound site. Integumentary (Hair, Skin) Wound #1 status is Open. Original cause of wound was Gradually Appeared. The date acquired was: 04/06/2021. The wound has been in treatment 1 [name]. The wound is located on the Right,Dorsal Wrist. The wound measures 2.7cm length x 3cm width x 0.3cm depth; 6.362cm^2 area and 1.909cm^3 volume. There is Fat Layer (Subcutaneous Tissue) exposed. There is [name] tunneling or undermining noted. There is a medium amount of serosanguineous drainage noted. The wound margin is distinct with the outline attached to the wound base. There is small (1-33%) [name], pink granulation within the wound bed. There is a large (67- 100%) amount of necrotic tissue within the wound bed including Adherent Slough. Wound #2 status is Open. Original cause of wound was Gradually Appeared. The date acquired was:  04/04/2021. The wound has been in treatment 1 [name]. The wound is located on the Right Hand - Dorsum. The wound measures 0cm length x 0cm width x 0cm depth; 0cm^2 area and 0cm^3 volume. Wound #3 status is Open. Original cause of wound was Gradually Appeared. The date acquired was: 04/07/2021. The wound has been in treatment 1 [name]. The wound is located on the Right,Lateral Hand - Dorsum. The wound measures 0.8cm length x 0.5cm width x 0.1cm depth; 0.314cm^2 area and 0.031cm^3 volume. There is Fat Layer (Subcutaneous Tissue) exposed. There is [name] tunneling or undermining noted. There is a medium amount of serosanguineous drainage noted. The wound margin is distinct with the outline attached to the wound base. There is large (67-100%) [name], pink granulation within the wound bed. There is a small (1-33%) amount of necrotic tissue within the wound bed including Adherent Slough. Assessment Active Problems ICD-10 Non-pressure chronic ulcer of skin of other sites with other specified severity Unspecified open wound of right wrist, sequela Granulomatous disorder of the skin and subcutaneous tissue, unspecified Rheumatoid arthritis without rheumatoid factor, right wrist It is unclear the etiology of this wound. It does not look infected today. I [name] not think she would benefit from another course of antibiotics. She did state that prednisone had helped with the swelling and erythema. I asked her to continue this for 3 more days and I will see her again to reassess the area. I wonder if there is a vasculitis or other inflammatory process leading to this wound. She does have a history of rheumatoid arthritis on methotrexate. Plan Follow-up Appointments: Return Appointment in: - Thursday 04/20/2021 Bathing/ Shower/ Hygiene: May shower with protection but [name] not get wound dressing(s) wet. The following medication(s) was prescribed: prednisone oral 20 mg tablet 2 2 tablet oral, daily for the next 4 days starting  04/17/2021 WOUND #1: - Wrist Wound Laterality: Dorsal, Right Cleanser: Wound Cleanser (Generic) 1 x Per Day/15 Days Discharge Instructions: Cleanse the wound with wound cleanser prior to applying a clean dressing using gauze sponges, not tissue or cotton balls. Prim Dressing: MediHoney Calcium Alginate Dressing 4x5 in 1 x Per Day/15 Days ary Discharge Instructions: Apply to wound bed as instructed 1. Continue prednisone for 3 days 2. Follow-up at the end of the week 3. Can continue Medihoney as previously prescribed at last clinic visit Electronic Signature(s) Signed: 04/17/2021 2:23:27 PM By: Dana [name] Entered By: Dana Shan on 04/17/2021 14:23:02 -------------------------------------------------------------------------------- HxROS Details Patient Name: Date of Service: Etheleen Mayhew  N H. 04/17/2021 11:15 A M Medical Record Number: [id number] Patient Account Number: [id number] Date of Birth/Sex: Treating RN: [birth year] (86 y.o. Dana [name]: Dana [name] Other Clinician: Referring [name]: Treating [name]/Extender: Dana [name] in Treatment: 1 Information Obtained From Patient Eyes Medical History: Negative for: Cataracts; Glaucoma; Optic Neuritis Ear/Nose/Mouth/Throat Medical History: Negative for: Chronic sinus problems/congestion; Middle ear problems Hematologic/Lymphatic Medical History: Negative for: Anemia; Hemophilia; Human Immunodeficiency Virus; Lymphedema; Sickle Cell Disease Respiratory Medical History: Positive for: Chronic Obstructive Pulmonary Disease (COPD) Negative for: Aspiration; Asthma; Pneumothorax; Sleep Apnea; Tuberculosis Cardiovascular Medical History: Positive for: Hypertension Negative for: Angina; Arrhythmia; Congestive Heart Failure; Coronary Artery Disease; Deep Vein Thrombosis; Hypotension; Myocardial Infarction; Peripheral Arterial Disease; Peripheral Venous Disease;  Phlebitis; Vasculitis Gastrointestinal Medical History: Negative for: Cirrhosis ; Colitis; Crohns; Hepatitis A; Hepatitis B; Hepatitis C Past Medical History Notes: GERD Endocrine Medical History: Negative for: Type I Diabetes; Type II Diabetes Genitourinary Medical History: Negative for: End Stage Renal Disease Immunological Medical History: Negative for: Lupus Erythematosus; Raynauds; Scleroderma Integumentary (Skin) Medical History: Negative for: History of Burn Musculoskeletal Medical History: Positive for: Rheumatoid Arthritis Negative for: Gout; Osteoarthritis; Osteomyelitis Past Medical History Notes: osteoporosis Neurologic Medical History: Negative for: Dementia; Neuropathy; Quadriplegia; Paraplegia; Seizure Disorder Oncologic Medical History: Positive for: Received Chemotherapy - received 12 years ago; Received Radiation - received 12 years ago Past Medical History Notes: Tarceva started in April Psychiatric Medical History: Negative for: Anorexia/bulimia; Confinement Anxiety Immunizations Pneumococcal Vaccine: Received Pneumococcal Vaccination: Yes Implantable Devices None Hospitalization / Surgery History Type of Hospitalization/Surgery talc pleurodesis mass excision right wrist with culture and biopsy anal fissure repair microlaryngoscopy extracorporeal shock wave llithotripsy Family and Social History Unknown History: Yes; Former smoker; Marital Status - Married; Alcohol Use: Never; Drug Use: [name] History; Caffeine Use: Rarely; Financial Concerns: [name]; Food, Clothing or Shelter Needs: [name]; Support System Lacking: [name]; Transportation Concerns: [name] Electronic Signature(s) Signed: 04/17/2021 2:23:27 PM By: Dana [name] Signed: 04/17/2021 4:57:45 PM By: Baruch Gouty RN, BSN Entered By: Dana Shan on 04/17/2021 13:40:29 -------------------------------------------------------------------------------- SuperBill Details Patient Name: Date of  Service: HOLLIE, WOJAHN 04/17/2021 Medical Record Number: [id number] Patient Account Number: [id number] Date of Birth/Sex: Treating RN: 1935/01/26 (86 y.o. [name], Tammi Klippel Primary Care [name]: Dana [name] Other Clinician: Referring [name]: Treating [name]/Extender: Dana [name] in Treatment: 1 Diagnosis Coding ICD-10 Codes Code Description [phone] Non-pressure chronic ulcer of skin of other sites with other specified severity S61.501S Unspecified open wound of right wrist, sequela L92.9 Granulomatous disorder of the skin and subcutaneous tissue, unspecified M06.031 Rheumatoid arthritis without rheumatoid factor, right wrist Facility Procedures CPT4 Code: [id number] Description: 99214 - WOUND CARE VISIT-LEV 4 EST PT Modifier: Quantity: 1 Electronic Signature(s) Signed: 04/17/2021 2:23:27 PM By: Dana [name] Signed: 04/17/2021 4:05:30 PM By: Deon Pilling Entered By: Deon Pilling on 04/17/2021 11:47:42

## 2021-04-18 ENCOUNTER — Ambulatory Visit: Payer: Medicare Other | Admitting: Internal Medicine

## 2021-04-18 ENCOUNTER — Other Ambulatory Visit: Payer: Self-pay

## 2021-04-18 ENCOUNTER — Encounter: Payer: Self-pay | Admitting: Internal Medicine

## 2021-04-18 DIAGNOSIS — J449 Chronic obstructive pulmonary disease, unspecified: Secondary | ICD-10-CM | POA: Diagnosis not present

## 2021-04-18 MED ORDER — STIOLTO RESPIMAT 2.5-2.5 MCG/ACT IN AERS: 2.0000 | INHALATION_SPRAY | Freq: Every day | RESPIRATORY_TRACT | 11 refills | Status: DC

## 2021-04-18 NOTE — Progress Notes (Signed)
Dana [name], female    DOB: February 10, 1935    MRN: [id number]    Brief patient profile:  62 yowf mother of primary care physician in [place] quit smoking 1979 with chronic doe prior  evals with nl spirometry as recently as 12/03/09   07/05/2016  Walked RA x 3 laps @ 185 ft each stopped due to  End of study, nl pace no desat or sob  - Spirometry 07/05/2016  wnl p am spiriva  - CTa chest 07/05/2016 neg pe/ neg ILD, R infrahilar density c/w lung ca vs post RT scar     Oncology note 11/17/18 : DIAGNOSIS: Stage IIIB (T2a, N3, M0) non-small cell lung cancer, adenocarcinoma with positive EGFR mutation diagnosed in January 2010.  PRIOR THERAPY: 1) Status post concurrent chemoradiation with weekly carboplatin and paclitaxel; last dose given March 21, 2009.  2) Tarceva at 150 mg p.o. daily, status post approximately 48 months of treatment, discontinued secondary to persistent diarrhea.  3) status post right Pleurx catheter placement for right nonmalignant pleural effusion. 4) R pleurex Gerhardt 2017 / sp pleurodesis  CURRENT THERAPY: Tarceva 100 mg by mouth daily started 03/19/2013, status post 68 months of treatment.     History of Present Illness  11/25/2018  Pulmonary consultation / Dr Blima Singer re sob   Chief Complaint  Patient presents with  . Pulmonary Consult    Referred by Theadore Nan. Pt c/o SOB x 10 yrs, worse x 3 months. She gets out of breath walking up a flight of stairs.   Dyspnea:  Indolent onset/ As of 3 months prior to [place] worse" to point where struggles step to step and stops at top of one flight  Still able to shop at [employer] / does not check sats  Cough: none Sleep: fine flat on one pillow SABA use: none  rec Goal is to keep your 02 saturation above 90% by walking slower or adjusting 02 flow to reach at least 90%      12/03/2018  f/u ov/Lamaria Hildebrandt re: sob Chief Complaint  Patient presents with  . Follow-up    She states breathing seems  better but she has not been as active. She was started on o2 per PCP- she only uses when she is at home during the day, none with sleep.   Dyspnea:  Has not tried steps yet but seems easier room to room 2lpm and  Typically maintaining > 90% when she checks it  Cough: none Sleeping: fine  SABA use: none 02: 2lpm prn daytime not at hs  rec Start anoro one click each am  - take two good deep drags then rinse and gargle  Adjust 02 saturations to keep it above 90%         06/22/2019  f/u ov/Desmon Hitchner re: COPD GOLD 0/worse doe on anoro with assoc uacs/ no longer using 02 Chief Complaint  Patient presents with  . Follow-up    Breathing has been worse since the last visit. She states she feels exhausted and is afraid to go out anywhere alone. She has prod cough with clear sputum.   Dyspnea:  MMRC3 = can't walk 100 yards even at a slow pace at a flat grade s stopping due to sob   Cough: throat clearing esp after anoro Sleeping: ok flat fine SABA use: none 02: none  rec Stop anoro and start stiolto 2 puffs each am in its place Prednisone 10 mg take  4 each am  x 2 days,   2 each am x 2 days,  1 each am x 2 days and stop  Work on inhaler technique:  relax and gently blow all the way out then take a nice smooth deep breath back in, triggering the inhaler at same time you start breathing in.  Hold for up to 5 seconds if you can.  . Rinse and gargle with water when done Stop boniva after this month Protonix 40 mg Take 30-60 min before first meal of the day daily  Please schedule a follow up office visit in 2 weeks, sooner if needed    07/10/2019  f/u ov/Wahneta Derocher re: COPD 0 rx stiolto / rash on protonix resolved off  Chief Complaint  Patient presents with  . Follow-up    Breathing is "great".  No new co's.  Dyspnea:  Better but still = MMRC2 = can't walk a nl pace on a flat grade s sob but does fine slow and flat  Cough: much better  Sleeping: flat fine one pillow  SABA use: none  02: none  Overt hb  off ppi  rec Continue stiolto 2 pffs daily - other option Bevespi 2 every 12 hours If getting worse >  Prednisone 10 mg take  4 each am x 2 days,   2 each am x 2 days,  1 each am x 2 days and stop   Pepcid 20 mg after bfast and supper    10/09/2019  f/u ov/Evangelynn Lochridge re: GOLD 0  stiolto Chief Complaint  Patient presents with  . Follow-up    Breathing is unchanged since the last visit.   Dyspnea:  Up and down ailes at [employer] for up to an hour Cough: no Sleeping: able to lie flat one pillow  SABA use: none 02: none  rec Continue pepcid (famotidine) 20 mg after bfast and supper Try just one puff stiolto each am top see if you can tell any difference with walking at [employer]   . 04/08/2020  f/u ov/Christohper Dube re: gold 0  On stiolto one puff daily / covid  second shot feb 5th moderna Chief Complaint  Patient presents with  . Follow-up    Breathing is "ok".  She states does well as long as she stays active. She does not have a rescue inhaler.    Dyspnea:  Still doing [employer] for up to an hour but does it immediately after stiolto (no more than an hour after) Cough: sometimes p walking minimal creamy Sleeping: flat one pillow SABA use: none  02: none  rec Ok to try stiolto 1-2 puffs at least an hour you try to walk at [facility]    10/18/2020  f/u ov/Shadi Sessler re:  COPD gold 0 better on stiolto x 2 puffs each am  Chief Complaint  Patient presents with  . Follow-up    COPD, breathing is good, hospitalized for C-diff last month   Dyspnea:  Walking with 3 pronged walker and has rollator / house confined  Cough: none Sleeping: able to lie flat  SABA use: none 02: none consistently high even walking rec Ok to use pepcid 20 mg after bfast and supper  GERD diet  Please schedule a follow up visit in 6 months but call sooner if needed    04/18/2021  f/u ov/Webb Weed re: COPD GOLD 0  Chief Complaint  Patient presents with  . Follow-up    Sob-better, denies cough, more active with nice weather  Dyspnea:  now using rollator, not limited by  breathing, better p ? botox for vc paralysis  Cough: none  Sleeping: flat bed  SABA use: none/ just stiolto 2 each am  02: none  Covid status:   moderna x 2 /    No obvious day to day or daytime variability or assoc excess/ purulent sputum or mucus plugs or hemoptysis or cp or chest tightness, subjective wheeze or overt sinus or hb symptoms.   Sleeping as above  without nocturnal  or early am exacerbation  of respiratory  c/o's or need for noct saba. Also denies any obvious fluctuation of symptoms with weather or environmental changes or other aggravating or alleviating factors except as outlined above   No unusual exposure hx or h/o childhood pna/ asthma or knowledge of premature birth.  Current Allergies, Complete Past Medical History, Past Surgical History, Family History, and Social History were reviewed in [employer] record.  ROS  The following are not active complaints unless bolded Hoarseness, sore throat, dysphagia, dental problems, itching, sneezing,  nasal congestion or discharge of excess mucus or purulent secretions, ear ache,   fever, chills, sweats, unintended wt loss or wt gain, classically pleuritic or exertional cp,  orthopnea pnd or arm/hand swelling  or leg swelling, presyncope, palpitations, abdominal pain, anorexia, nausea, vomiting, diarrhea  or change in bowel habits or change in bladder habits, change in stools or change in urine, dysuria, hematuria,  rash, arthralgias, visual complaints, headache, numbness, weakness or ataxia or problems with walking or coordination,  change in mood or  Memory. Wound R forearm > f/b wound clinic         Current Meds  Medication Sig  . antiseptic oral rinse (BIOTENE) LIQD 15 mLs by Mouth Rinse route as needed for dry mouth.  Marland Kitchen b complex vitamins capsule Take 1 capsule by mouth daily.  Marland Kitchen bismuth subsalicylate (PEPTO BISMOL) 262 MG/15ML suspension Take 30 mLs by mouth every three  (3) days as needed.  . Calcium-Vitamin D ([employer] CALCIUM-600/VIT D PO) Take 2 tablets by mouth daily.  . cholecalciferol (VITAMIN D) 1000 UNITS tablet Take 2,000 Units by mouth daily.  . diclofenac (VOLTAREN) 75 MG EC tablet Take 75 mg by mouth 2 (two) times daily.  . diclofenac Sodium (VOLTAREN) 1 % GEL Apply 2 g topically 4 (four) times daily as needed (pain).  Marland Kitchen doxycycline (ADOXA) 100 MG tablet Take 100 mg by mouth 2 (two) times daily.  Marland Kitchen erlotinib (TARCEVA) 100 MG tablet Take 1 tablet (100 mg total) by mouth daily.  Marland Kitchen lactase (LACTAID) 3000 units tablet Take 3,000 Units by mouth daily.  Marland Kitchen loratadine (CLARITIN) 10 MG tablet Take 10 mg by mouth daily as needed for allergies.  . meloxicam (MOBIC) 15 MG tablet Take 15 mg by mouth daily as needed for pain.  . mirtazapine (REMERON) 15 MG tablet TAKE 1 TABLET BY MOUTH AT BEDTIME  . Misc Natural Products (OSTEO BI-FLEX JOINT SHIELD PO) Take 2 tablets by mouth daily.  Marland Kitchen oxycodone-acetaminophen (LYNOX) 10-300 MG tablet Take 1 tablet by mouth every 4 (four) hours as needed for pain.  Vladimir Faster Glycol-Propyl Glycol (SYSTANE OP) Place 1 drop into both eyes at bedtime as needed (for dry eyes.).  Marland Kitchen Simethicone (GAS-X PO) Take 1 tablet by mouth 2 (two) times daily as needed (for gas).   . STIOLTO RESPIMAT 2.5-2.5 MCG/ACT AERS INHALE 2 PUFFS BY MOUTH ONCE DAILY (Patient taking differently: Inhale 2 puffs into the lungs daily.)  Objective:        04/18/2021        110 10/18/2020      108 04/08/2020        111 10/09/2019      111  07/10/2019        115  06/22/2019        114  05/08/2019        115  12/03/2018      120  11/25/18 116 lb (52.6 kg)  11/17/18 117 lb 6.4 oz (53.3 kg)  09/16/18 117 lb 4.8 oz (53.2 kg)    Vital signs reviewed  04/18/2021  - Note at rest 02 sats  98% on RA   General appearance:    Pleasant thin well preserved wf nad      HEENT : pt wearing mask not removed for exam due to covid - 19 concerns.    NECK :  without JVD/Nodes/TM/ nl carotid upstrokes bilaterally   LUNGS: no acc muscle use,  Min barrel  contour chest wall with bilateral  slightly decreased bs s audible wheeze and  without cough on insp or exp maneuvers and min  Hyperresonant  to  percussion bilaterally     CV:  RRR  no s3 or murmur or increase in P2, and no edema   ABD:  soft and nontender with pos end  insp Hoover's  in the supine position. No bruits or organomegaly appreciated, bowel sounds nl  MS:   Nl gait/  ext warm without deformities, calf tenderness, cyanosis or clubbing No obvious joint restrictions   SKIN: warm and dry with dry dressing over R forearm  NEURO:  alert, approp, nl sensorium with  no motor or cerebellar deficits apparent.                   Assessment

## 2021-04-18 NOTE — Assessment & Plan Note (Signed)
Quit smoking 1979 07/05/2016  Walked RA x 3 laps @ 185 ft each stopped due to  End of study, nl pace no desat or sob  - Spirometry 07/05/2016  wnl p am spiriva  - CTa chest 07/05/2016 neg pe/ neg ILD, R infrahilar density c/w lung ca vs post RT scar   Chest CT w contrast  09/12/18  Stable CT of the chest - 11/25/2018   Walked RA  2 laps @ 236ft each @ moderate pace  stopped due to  desats to 86% corrected on 2lpm  - HRCT  11/28/18 1. No findings to suggest interstitial lung disease. 2. Chronic postradiation changes in the right lung and evidence of prior right-sided pleurodesis redemonstrated, as above. - PFT's  12/03/2018  FEV1 1.53 (86 % ) ratio 80  p 18 % improvement from saba p nothing prior to study with DLCO  52/52 % corrects to 80 % for alv volume  With minimal curvature   - 06/22/2019   Walked RA  2 laps @  approx 246ft each @ slow to moderate pace  stopped due to  End of study, min sob, no desats   - 06/22/2019   > try stiolto instead of anoro x 2 weeks > better - 10/09/2019    try stiolto just one puff each am - CT 10/16/2019 Mild centrilobular and paraseptal emphysematous changes, upper lung Predominant. - 04/08/2020  After extensive coaching inhaler device,  effectiveness =    90% with smi > continue stiolto altenate 1 with 2 pffs  > does better on 2 puffs q am  - 04/18/2021 rec reduce stiolto to 1 daily   Pt is Group B in terms of symptom/risk and laba/lama therefore appropriate rx at this point >>>  stiolto appropriate and again don't feel she should need max dose so ok will challenge again with reducing to one pff daily and pulmonary f/u can be prn          Each maintenance medication was reviewed in detail including emphasizing most importantly the difference between maintenance and prns and under what circumstances the prns are to be triggered using an action plan format where appropriate.  Total time for H and P, chart review, counseling, reviewing [facility] device(s) and generating  customized AVS unique to this office visit / same day charting = 25 min

## 2021-04-18 NOTE — Patient Instructions (Signed)
Ok to try again reducing the Stiolto to one puff daily to see what difference if any in makes in your wind when walking    If you are satisfied with your treatment plan,  let your doctor know and he/she can either refill your medications or you can return here when your prescription runs out.     If in any way you are not 100% satisfied,  please tell us.  If 100% better, tell your friends!  Pulmonary follow up is as needed

## 2021-04-20 ENCOUNTER — Other Ambulatory Visit: Payer: Self-pay

## 2021-04-20 ENCOUNTER — Other Ambulatory Visit (HOSPITAL_BASED_OUTPATIENT_CLINIC_OR_DEPARTMENT_OTHER): Payer: Self-pay | Admitting: Internal Medicine

## 2021-04-20 ENCOUNTER — Encounter (HOSPITAL_BASED_OUTPATIENT_CLINIC_OR_DEPARTMENT_OTHER): Payer: Medicare Other | Admitting: Internal Medicine

## 2021-04-20 DIAGNOSIS — S61501S Unspecified open wound of right wrist, sequela: Secondary | ICD-10-CM | POA: Diagnosis not present

## 2021-04-20 DIAGNOSIS — L859 Epidermal thickening, unspecified: Secondary | ICD-10-CM | POA: Diagnosis not present

## 2021-04-20 DIAGNOSIS — M06031 Rheumatoid arthritis without rheumatoid factor, right wrist: Secondary | ICD-10-CM

## 2021-04-20 DIAGNOSIS — L905 Scar conditions and fibrosis of skin: Secondary | ICD-10-CM | POA: Diagnosis not present

## 2021-04-20 DIAGNOSIS — L98498 Non-pressure chronic ulcer of skin of other sites with other specified severity: Secondary | ICD-10-CM | POA: Diagnosis not present

## 2021-04-20 DIAGNOSIS — C3490 Malignant neoplasm of unspecified part of unspecified bronchus or lung: Secondary | ICD-10-CM | POA: Diagnosis not present

## 2021-04-20 DIAGNOSIS — L929 Granulomatous disorder of the skin and subcutaneous tissue, unspecified: Secondary | ICD-10-CM | POA: Diagnosis not present

## 2021-04-20 NOTE — Progress Notes (Signed)
Dana [name], Dana [name] ([id number]) Visit Report for 04/20/2021 Biopsy Details Patient Name: Date of Service: Dana [name], Dana [name] 04/20/2021 10:45 A M Medical Record Number: [id number] Patient Account Number: [id number] Date of Birth/Sex: Treating RN: Aug 12, 1935 (86 y.o. Dana [name] Primary Care Provider: Leitha Bleak Other Clinician: Referring Provider: Treating Provider/Extender: Alean Rinne Weeks in Treatment: 1 Biopsy Performed for: Wound #1 Right, Dorsal Wrist Location(s): Wound Margin Performed By: Physician Kalman Shan, DO Tissue Punch: Yes Size (mm): 4 Number of Specimens T aken: 1 Specimen Sent T Pathology: o Yes Level of Consciousness (Pre-procedure): Awake and Alert Pre-procedure Verification/Time-Out Taken: Yes - 11:45 Pain Control: Lidocaine Injectable Lidocaine Percent: 1% Instrument: Forceps, Scissors Hemostasis Achieved: Pressure Procedural Pain: 0 Post Procedural Pain: 0 Response to Treatment: Procedure was tolerated well Level of Consciousness (Post-procedure): Awake and Alert Post Procedure Diagnosis Same as Pre-procedure Electronic Signature(s) Signed: 04/20/2021 5:50:09 PM By: Kalman Shan DO Signed: 04/20/2021 6:31:57 PM By: Deon Pilling Entered By: Deon Pilling on 04/20/2021 11:54:08 -------------------------------------------------------------------------------- Chief Complaint Document Details Patient Name: Date of Service: Dana [name] 04/20/2021 10:45 A M Medical Record Number: [id number] Patient Account Number: [id number] Date of Birth/Sex: Treating RN: 1935-04-04 (86 y.o. Dana [name] Primary Care Provider: Leitha Bleak Other Clinician: Referring Provider: Treating Provider/Extender: Alean Rinne Weeks in Treatment: 1 Information Obtained from: Patient Chief Complaint 04/10/2021; patient is here for review of a wound on the right medial wrist and arm. Electronic  Signature(s) Signed: 04/20/2021 5:50:09 PM By: Kalman Shan DO Entered By: Kalman Shan on 04/20/2021 17:37:15 -------------------------------------------------------------------------------- HPI Details Patient Name: Date of Service: Dana [name] 04/20/2021 10:45 A M Medical Record Number: [id number] Patient Account Number: [id number] Date of Birth/Sex: Treating RN: April 04, 1935 (86 y.o. Dana [name] Primary Care Provider: Leitha Bleak Other Clinician: Referring Provider: Treating Provider/Extender: Alean Rinne Weeks in Treatment: 1 History of Present Illness HPI Description: ADMISSION 04/10/2021 This is a 86 year old woman who was worked in urgently today at the request of her family. Apparently they saw an area on her right medial wrist at roughly the head of the ulna. This came on about 5 days ago the patient describes a small spot that is rapidly expanded into a wound with a necrotic surface. She has smaller areas on the medial aspect of the arm and also the medial aspect of the hand however they do not have been necrotic surfaces. She does not describe a lot of pain. The patient has a long history of problems in this wrist and hand. This was extensively worked up by Dr. Ronalee Red at [facility] in 2018 with multiple biopsies diagnosed granulomatous dermatitis. Cultures were negative for fungus and Mycobacterium at that point she was started on methotrexate. The patient has not been back there since she is followed instead with Dr. Fontaine No at [facility] dermatology. Patient states that she has had wounds in this area that look like this before and she will be prescribed cream and it will eventually get better. Past medical history has very little. She follows with Dr. [name] at oncology for stage IIIb non-small cell lung cancer. She has had this for several years. She is currently on T arceva which appears to have kept this under control. She also  apparently has seronegative rheumatoid arthritis which is felt to be affecting her right wrist she follows with rheumatology. 4/25; patient presents as a nurse visit but is concerned about the surrounding erythema to the wound  and would like to be seen by a physician. Patient states that she was recently seen by dermatology for her atypical wound and started on doxycycline and prednisone. She states that once she stopped the prednisone the erythema returned to the surrounding wound bed. Patient states that this has been an ongoing issue for the past 7 years. She states the wound heals but then opened spontaneously. It spontaneously opened about 2 weeks ago. She denies any drainage, fever/chills or increased warmth to the wound site. 4/28; she was last seen 3 days ago. She had a wound that has been increasing in size significantly over the past week. She was started on prednisone by her dermatologist and had finished that and I continued this on for a few days to see if it continued to improve the wound appearance and size. Today she states that there is less swelling and erythema to the surrounding wound bed. She has a dermatology appointment on 5/3. She overall thinks the wound is stable. Electronic Signature(s) Signed: 04/20/2021 5:50:09 PM By: Kalman Shan DO Entered By: Kalman Shan on 04/20/2021 17:39:26 -------------------------------------------------------------------------------- Physical Exam Details Patient Name: Date of Service: Dana [name], Dana [name] 04/20/2021 10:45 A M Medical Record Number: [id number] Patient Account Number: [id number] Date of Birth/Sex: Treating RN: June 04, 1935 (86 y.o. Dana [name] Primary Care Provider: Leitha Bleak Other Clinician: Referring Provider: Treating Provider/Extender: Alean Rinne Weeks in Treatment: 1 Constitutional respirations regular, non-labored and within target range for patient.Marland Kitchen Psychiatric pleasant and  cooperative. Notes Right dorsal Wrist: Ulceration with fibrotic tissue and circumferential erythema. Scant granulation tissue present. No acute signs of infection. Radial pulses palpable. Electronic Signature(s) Signed: 04/20/2021 5:50:09 PM By: Kalman Shan DO Signed: 04/20/2021 5:50:09 PM By: Kalman Shan DO Entered By: Kalman Shan on 04/20/2021 17:41:06 -------------------------------------------------------------------------------- Physician Orders Details Patient Name: Date of Service: Dana [name], Dana [name] 04/20/2021 10:45 A M Medical Record Number: [id number] Patient Account Number: [id number] Date of Birth/Sex: Treating RN: 1935-04-15 (86 y.o. Dana [name], Dana [name] Primary Care Provider: Leitha Bleak Other Clinician: Referring Provider: Treating Provider/Extender: Alean Rinne Weeks in Treatment: 1 Verbal / Phone Orders: No Diagnosis Coding ICD-10 Coding Code Description L98.498 Non-pressure chronic ulcer of skin of other sites with other specified severity S61.501S Unspecified open wound of right wrist, sequela L92.9 Granulomatous disorder of the skin and subcutaneous tissue, unspecified M06.031 Rheumatoid arthritis without rheumatoid factor, right wrist Follow-up Appointments ppointment in 1 week. - Dr. Dellia Nims Return A Bathing/ Shower/ Hygiene May shower with protection but do not get wound dressing(s) wet. Additional Orders / Instructions Other: - Pick up the prednisone at pharmacy and start taking. Ensure to follow up with Dermatology on 04/25/2021. Phoned Rheumatologist to schedule appointment for evaluation of medication and wound. Wound Treatment Wound #1 - Wrist Wound Laterality: Dorsal, Right Cleanser: Wound Cleanser (Generic) 1 x Per Day/15 Days Discharge Instructions: Cleanse the wound with wound cleanser prior to applying a clean dressing using gauze sponges, not tissue or cotton balls. Prim Dressing: MediHoney Calcium Alginate  Dressing 4x5 in 1 x Per Day/15 Days ary Discharge Instructions: Apply to wound bed as instructed Secondary Dressing: Woven Gauze Sponge, Non-Sterile 4x4 in 1 x Per Day/15 Days Discharge Instructions: Apply over primary dressing as directed. Secured With: Child psychotherapist, Sterile 2x75 (in/in) 1 x Per Day/15 Days Discharge Instructions: Secure with stretch gauze as directed. Secured With: 68M Medipore H Soft Cloth Surgical T 4 x 2 (in/yd) 1 x Per Day/15 Days ape  Discharge Instructions: Secure dressing with tape as directed. Laboratory Bacteria identified in Tissue by Biopsy culture (MICRO) - biopsy of right wrist. LOINC Code: [id number] Convenience Name: Biopsy specimen culture Patient Medications llergies: amoxicillin, Protonix A Notifications Medication Indication Start End prednisone DOSE 2 - oral 20 mg tablet - 2 tablets daily for 4 more days Electronic Signature(s) Signed: 04/20/2021 5:48:01 PM By: Kalman Shan DO Entered By: Kalman Shan on 04/20/2021 17:48:01 -------------------------------------------------------------------------------- Problem List Details Patient Name: Date of Service: Dana [name] 04/20/2021 10:45 A M Medical Record Number: [id number] Patient Account Number: [id number] Date of Birth/Sex: Treating RN: [birth year] (86 y.o. Dana [name], Dana [name] Primary Care Provider: Leitha Bleak Other Clinician: Referring Provider: Treating Provider/Extender: Alean Rinne Weeks in Treatment: 1 Active Problems ICD-10 Encounter Code Description Active Date MDM Diagnosis L98.498 Non-pressure chronic ulcer of skin of other sites with other specified severity 04/10/2021 No Yes S61.501S Unspecified open wound of right wrist, sequela 04/10/2021 No Yes L92.9 Granulomatous disorder of the skin and subcutaneous tissue, unspecified 04/10/2021 No Yes M06.031 Rheumatoid arthritis without rheumatoid factor, right wrist 04/10/2021 No  Yes Inactive Problems Resolved Problems Electronic Signature(s) Signed: 04/20/2021 5:50:09 PM By: Kalman Shan DO Entered By: Kalman Shan on 04/20/2021 17:36:56 -------------------------------------------------------------------------------- Progress Note Details Patient Name: Date of Service: Dana [name] 04/20/2021 10:45 A M Medical Record Number: [id number] Patient Account Number: [id number] Date of Birth/Sex: Treating RN: 22-Sep-1935 (86 y.o. Dana [name] Primary Care Provider: Leitha Bleak Other Clinician: Referring Provider: Treating Provider/Extender: Alean Rinne Weeks in Treatment: 1 Subjective Chief Complaint Information obtained from Patient 04/10/2021; patient is here for review of a wound on the right medial wrist and arm. History of Present Illness (HPI) ADMISSION 04/10/2021 This is a 86 year old woman who was worked in urgently today at the request of her family. Apparently they saw an area on her right medial wrist at roughly the head of the ulna. This came on about 5 days ago the patient describes a small spot that is rapidly expanded into a wound with a necrotic surface. She has smaller areas on the medial aspect of the arm and also the medial aspect of the hand however they do not have been necrotic surfaces. She does not describe a lot of pain. The patient has a long history of problems in this wrist and hand. This was extensively worked up by Dr. Ronalee Red at [facility] in 2018 with multiple biopsies diagnosed granulomatous dermatitis. Cultures were negative for fungus and Mycobacterium at that point she was started on methotrexate. The patient has not been back there since she is followed instead with Dr. Fontaine No at [facility] dermatology. Patient states that she has had wounds in this area that look like this before and she will be prescribed cream and it will eventually get better. Past medical history has very little. She  follows with Dr. [name] at oncology for stage IIIb non-small cell lung cancer. She has had this for several years. She is currently on T arceva which appears to have kept this under control. She also apparently has seronegative rheumatoid arthritis which is felt to be affecting her right wrist she follows with rheumatology. 4/25; patient presents as a nurse visit but is concerned about the surrounding erythema to the wound and would like to be seen by a physician. Patient states that she was recently seen by dermatology for her atypical wound and started on doxycycline and prednisone. She states that once she stopped the prednisone  the erythema returned to the surrounding wound bed. Patient states that this has been an ongoing issue for the past 7 years. She states the wound heals but then opened spontaneously. It spontaneously opened about 2 weeks ago. She denies any drainage, fever/chills or increased warmth to the wound site. 4/28; she was last seen 3 days ago. She had a wound that has been increasing in size significantly over the past week. She was started on prednisone by her dermatologist and had finished that and I continued this on for a few days to see if it continued to improve the wound appearance and size. Today she states that there is less swelling and erythema to the surrounding wound bed. She has a dermatology appointment on 5/3. She overall thinks the wound is stable. Patient History Information obtained from Patient. Family History Unknown History. Social History Former smoker, Marital Status - Married, Alcohol Use - Never, Drug Use - No History, Caffeine Use - Rarely. Medical History Eyes Denies history of Cataracts, Glaucoma, Optic Neuritis Ear/Nose/Mouth/Throat Denies history of Chronic sinus problems/congestion, Middle ear problems Hematologic/Lymphatic Denies history of Anemia, Hemophilia, Human Immunodeficiency Virus, Lymphedema, Sickle Cell  Disease Respiratory Patient has history of Chronic Obstructive Pulmonary Disease (COPD) Denies history of Aspiration, Asthma, Pneumothorax, Sleep Apnea, Tuberculosis Cardiovascular Patient has history of Hypertension Denies history of Angina, Arrhythmia, Congestive Heart Failure, Coronary Artery Disease, Deep Vein Thrombosis, Hypotension, Myocardial Infarction, Peripheral Arterial Disease, Peripheral Venous Disease, Phlebitis, Vasculitis Gastrointestinal Denies history of Cirrhosis , Colitis, Crohnoos, Hepatitis A, Hepatitis B, Hepatitis C Endocrine Denies history of Type I Diabetes, Type II Diabetes Genitourinary Denies history of End Stage Renal Disease Immunological Denies history of Lupus Erythematosus, Raynaudoos, Scleroderma Integumentary (Skin) Denies history of History of Burn Musculoskeletal Patient has history of Rheumatoid Arthritis Denies history of Gout, Osteoarthritis, Osteomyelitis Neurologic Denies history of Dementia, Neuropathy, Quadriplegia, Paraplegia, Seizure Disorder Oncologic Patient has history of Received Chemotherapy - received 12 years ago, Received Radiation - received 12 years ago Psychiatric Denies history of Anorexia/bulimia, Confinement Anxiety Hospitalization/Surgery History - talc pleurodesis. - mass excision right wrist with culture and biopsy. - anal fissure repair. - microlaryngoscopy. - extracorporeal shock wave llithotripsy. Medical A Surgical History Notes nd Gastrointestinal GERD Musculoskeletal osteoporosis Oncologic Tarceva started in April Objective Constitutional respirations regular, non-labored and within target range for patient.. Vitals Time Taken: 10:43 AM, Height: 64 in, Weight: 110 lbs, BMI: 18.9, Temperature: 97.9 F, Pulse: 97 bpm, Respiratory Rate: 20 breaths/min, Blood Pressure: 168/89 mmHg. Psychiatric pleasant and cooperative. General Notes: Right dorsal Wrist: Ulceration with fibrotic tissue and circumferential  erythema. Scant granulation tissue present. No acute signs of infection. Radial pulses palpable. Integumentary (Hair, Skin) Wound #1 status is Open. Original cause of wound was Gradually Appeared. The date acquired was: 04/06/2021. The wound has been in treatment 1 weeks. The wound is located on the Right,Dorsal Wrist. The wound measures 3cm length x 2.8cm width x 0.3cm depth; 6.597cm^2 area and 1.979cm^3 volume. There is Fat Layer (Subcutaneous Tissue) exposed. There is no tunneling or undermining noted. There is a medium amount of serosanguineous drainage noted. The wound margin is distinct with the outline attached to the wound base. There is small (1-33%) red, pink granulation within the wound bed. There is a large (67- 100%) amount of necrotic tissue within the wound bed including Adherent Slough. Wound #3 status is Healed - Epithelialized. Original cause of wound was Gradually Appeared. The date acquired was: 04/07/2021. The wound has been in treatment 1 weeks. The wound is  located on the Right,Lateral Hand - Dorsum. The wound measures 0cm length x 0cm width x 0cm depth; 0cm^2 area and 0cm^3 volume. There is Fat Layer (Subcutaneous Tissue) exposed. There is no tunneling or undermining noted. There is a medium amount of serosanguineous drainage noted. The wound margin is distinct with the outline attached to the wound base. There is no granulation within the wound bed. There is a small (1-33%) amount of necrotic tissue within the wound bed. Assessment Active Problems ICD-10 Non-pressure chronic ulcer of skin of other sites with other specified severity Unspecified open wound of right wrist, sequela Granulomatous disorder of the skin and subcutaneous tissue, unspecified Rheumatoid arthritis without rheumatoid factor, right wrist Today the wound appears stable with some slight improvement in erythema and swelling. She last saw her dermatologist on 4/19 for this issue and it was thought to be an  acute ulceration of a chronically present rheumatoid nodule. She was started on prednisone at that time and I continued it until today. The wound overall is stable with slight improvement in appearance and size. I will continue the prednisone for a few more days as I do think this has helped. This will get her to her dermatology appointment and they can further evaluate. I also asked that she talk to her rheumatologist as this may be more of a rheumatological issue. A biopsy was also taken today at the patient's request. Procedures Wound #1 Pre-procedure diagnosis of Wound #1 is an Atypical located on the Right, Dorsal Wrist . There was a biopsy performed by Kalman Shan, DO. There was a biopsy performed on Wound Margin. The skin was cleansed and prepped with anti-septic followed by pain control using Lidocaine Injectable: 1%. Utilizing a 4 mm tissue punch, tissue was removed at its base with the following instrument(s): Forceps and Scissors and sent to pathology. A time out was conducted at 11:45, prior to the start of the procedure. The procedure was tolerated well with a pain level of 0 throughout and a pain level of 0 following the procedure. Post procedure Diagnosis Wound #1: Same as Pre-Procedure Plan Follow-up Appointments: Return Appointment in 1 week. - Dr. Dellia Nims Bathing/ Shower/ Hygiene: May shower with protection but do not get wound dressing(s) wet. Additional Orders / Instructions: Other: - Pick up the prednisone at pharmacy and start taking. Ensure to follow up with Dermatology on 04/25/2021. Phoned Rheumatologist to schedule appointment for evaluation of medication and wound. Laboratory ordered were: Biopsy specimen culture - biopsy of right wrist. The following medication(s) was prescribed: prednisone oral 20 mg tablet 2 2 tablets daily for 4 more days WOUND #1: - Wrist Wound Laterality: Dorsal, Right Cleanser: Wound Cleanser (Generic) 1 x Per Day/15 Days Discharge  Instructions: Cleanse the wound with wound cleanser prior to applying a clean dressing using gauze sponges, not tissue or cotton balls. Prim Dressing: MediHoney Calcium Alginate Dressing 4x5 in 1 x Per Day/15 Days ary Discharge Instructions: Apply to wound bed as instructed Secondary Dressing: Woven Gauze Sponge, Non-Sterile 4x4 in 1 x Per Day/15 Days Discharge Instructions: Apply over primary dressing as directed. Secured With: Child psychotherapist, Sterile 2x75 (in/in) 1 x Per Day/15 Days Discharge Instructions: Secure with stretch gauze as directed. Secured With: 26M Medipore H Soft Cloth Surgical T 4 x 2 (in/yd) 1 x Per Day/15 Days ape Discharge Instructions: Secure dressing with tape as directed. 1. Continue prednisone for 4 more days 2. Follow-up in 1 week with Dr. Dellia Nims 3. Continue Medihoney to the wound  bed Electronic Signature(s) Signed: 04/20/2021 5:50:09 PM By: Kalman Shan DO Entered By: Kalman Shan on 04/20/2021 17:48:10 -------------------------------------------------------------------------------- HxROS Details Patient Name: Date of Service: Dana [name] 04/20/2021 10:45 A M Medical Record Number: [id number] Patient Account Number: [id number] Date of Birth/Sex: Treating RN: 02-22-1935 (86 y.o. Dana [name] Primary Care Provider: Leitha Bleak Other Clinician: Referring Provider: Treating Provider/Extender: Alean Rinne Weeks in Treatment: 1 Information Obtained From Patient Eyes Medical History: Negative for: Cataracts; Glaucoma; Optic Neuritis Ear/Nose/Mouth/Throat Medical History: Negative for: Chronic sinus problems/congestion; Middle ear problems Hematologic/Lymphatic Medical History: Negative for: Anemia; Hemophilia; Human Immunodeficiency Virus; Lymphedema; Sickle Cell Disease Respiratory Medical History: Positive for: Chronic Obstructive Pulmonary Disease (COPD) Negative for: Aspiration; Asthma;  Pneumothorax; Sleep Apnea; Tuberculosis Cardiovascular Medical History: Positive for: Hypertension Negative for: Angina; Arrhythmia; Congestive Heart Failure; Coronary Artery Disease; Deep Vein Thrombosis; Hypotension; Myocardial Infarction; Peripheral Arterial Disease; Peripheral Venous Disease; Phlebitis; Vasculitis Gastrointestinal Medical History: Negative for: Cirrhosis ; Colitis; Crohns; Hepatitis A; Hepatitis B; Hepatitis C Past Medical History Notes: GERD Endocrine Medical History: Negative for: Type I Diabetes; Type II Diabetes Genitourinary Medical History: Negative for: End Stage Renal Disease Immunological Medical History: Negative for: Lupus Erythematosus; Raynauds; Scleroderma Integumentary (Skin) Medical History: Negative for: History of Burn Musculoskeletal Medical History: Positive for: Rheumatoid Arthritis Negative for: Gout; Osteoarthritis; Osteomyelitis Past Medical History Notes: osteoporosis Neurologic Medical History: Negative for: Dementia; Neuropathy; Quadriplegia; Paraplegia; Seizure Disorder Oncologic Medical History: Positive for: Received Chemotherapy - received 12 years ago; Received Radiation - received 12 years ago Past Medical History Notes: Tarceva started in April Psychiatric Medical History: Negative for: Anorexia/bulimia; Confinement Anxiety Immunizations Pneumococcal Vaccine: Received Pneumococcal Vaccination: Yes Implantable Devices None Hospitalization / Surgery History Type of Hospitalization/Surgery talc pleurodesis mass excision right wrist with culture and biopsy anal fissure repair microlaryngoscopy extracorporeal shock wave llithotripsy Family and Social History Unknown History: Yes; Former smoker; Marital Status - Married; Alcohol Use: Never; Drug Use: No History; Caffeine Use: Rarely; Financial Concerns: No; Food, Clothing or Shelter Needs: No; Support System Lacking: No; Transportation Concerns: No Electronic  Signature(s) Signed: 04/20/2021 5:50:09 PM By: Kalman Shan DO Signed: 04/20/2021 6:31:57 PM By: Deon Pilling Entered By: Kalman Shan on 04/20/2021 17:40:10 -------------------------------------------------------------------------------- SuperBill Details Patient Name: Date of Service: Dana [name] 04/20/2021 Medical Record Number: [id number] Patient Account Number: [id number] Date of Birth/Sex: Treating RN: 1935-12-13 (86 y.o. Dana [name], Dana [name] Primary Care Provider: Leitha Bleak Other Clinician: Referring Provider: Treating Provider/Extender: Alean Rinne Weeks in Treatment: 1 Diagnosis Coding ICD-10 Codes Code Description [id number] Non-pressure chronic ulcer of skin of other sites with other specified severity S61.501S Unspecified open wound of right wrist, sequela L92.9 Granulomatous disorder of the skin and subcutaneous tissue, unspecified M06.031 Rheumatoid arthritis without rheumatoid factor, right wrist Facility Procedures CPT4 Code: [id number] Description: 11104-Punch biopsy of skin (including simple closure, when performed) single lesion ICD-10 Diagnosis Description M06.031 Rheumatoid arthritis without rheumatoid factor, right wrist L92.9 Granulomatous disorder of the skin and subcutaneous  tissue, unspecified S61.501S Unspecified open wound of right wrist, sequela Modifier: Quantity: 1 Electronic Signature(s) Signed: 04/20/2021 5:50:09 PM By: Kalman Shan DO Entered By: Kalman Shan on 04/20/2021 17:49:46

## 2021-04-21 NOTE — Progress Notes (Signed)
SHALICE, Dana [name] ([id number]) Visit Report for 04/20/2021 Arrival Information Details Patient Name: Date of Service: Dana [name], Dana [name] 04/20/2021 10:45 A M Medical Record Number: [id number] Patient Account Number: [id number] Date of Birth/Sex: Treating RN: [birth year] (86 y.o. [name], Meta.Reding Primary Care Nathyn Luiz: Leitha Bleak Other Clinician: Referring Leshonda Galambos: Treating Artha Stavros/Extender: Alean Rinne Weeks in Treatment: 1 Visit Information History Since Last Visit Added or deleted any medications: No Patient Arrived: [name] Any new allergies or adverse reactions: No Arrival Time: 10:42 Had a fall or experienced change in No Accompanied By: self activities of daily living that may affect Transfer Assistance: None risk of falls: Patient Identification Verified: Yes Signs or symptoms of abuse/neglect since last visito No Secondary Verification Process Completed: Yes Hospitalized since last visit: No Patient Requires Transmission-Based Precautions: No Implantable device outside of the clinic excluding No Patient Has Alerts: No cellular tissue based products placed in the center since last visit: Has Dressing in Place as Prescribed: Yes Pain Present Now: No Electronic Signature(s) Signed: 04/21/2021 4:35:52 PM By: Sandre Kitty Entered By: Sandre Kitty on 04/20/2021 10:43:23 -------------------------------------------------------------------------------- Encounter Discharge Information Details Patient Name: Date of Service: Dana [name] 04/20/2021 10:45 A M Medical Record Number: [id number] Patient Account Number: [id number] Date of Birth/Sex: Treating RN: Dec 14, 1935 (86 y.o. Elam Dutch Primary Care Ariana Cavenaugh: Leitha Bleak Other Clinician: Referring Kaylana Fenstermacher: Treating Yarah Fuente/Extender: Alean Rinne Weeks in Treatment: 1 Encounter Discharge Information Items Post Procedure Vitals Discharge Condition:  Stable Temperature (F): 97.9 Ambulatory Status: Ambulatory Pulse (bpm): 97 Discharge Destination: Home Respiratory Rate (breaths/min): 18 Transportation: Private Auto Blood Pressure (mmHg): 168/89 Accompanied By: self Schedule Follow-up Appointment: Yes Clinical Summary of Care: Patient Declined Electronic Signature(s) Signed: 04/20/2021 5:26:54 PM By: Baruch Gouty RN, BSN Entered By: Baruch Gouty on 04/20/2021 12:31:08 -------------------------------------------------------------------------------- Multi Wound Chart Details Patient Name: Date of Service: Dana [name] 04/20/2021 10:45 A M Medical Record Number: [id number] Patient Account Number: [id number] Date of Birth/Sex: Treating RN: 1935/01/23 (86 y.o. [name], Tammi Klippel Primary Care Hasaan Radde: Leitha Bleak Other Clinician: Referring Fontella Shan: Treating Harl Wiechmann/Extender: Alean Rinne Weeks in Treatment: 1 Vital Signs Height(in): 37 Pulse(bpm): 45 Weight(lbs): 110 Blood Pressure(mmHg): 168/89 Body Mass Index(BMI): 19 Temperature(F): 97.9 Respiratory Rate(breaths/min): 20 Photos: [3:No Photos] [N/A:N/A] Right, Dorsal Wrist Right, Lateral Hand - Dorsum N/A Wound Location: Gradually Appeared Gradually Appeared N/A Wounding Event: Atypical Atypical N/A Primary Etiology: Chronic Obstructive Pulmonary Chronic Obstructive Pulmonary N/A Comorbid History: Disease (COPD), Hypertension, Disease (COPD), Hypertension, Rheumatoid Arthritis, Received Rheumatoid Arthritis, Received Chemotherapy, Received Radiation Chemotherapy, Received Radiation 04/06/2021 04/07/2021 N/A Date Acquired: 1 1 N/A Weeks of Treatment: Open Healed - Epithelialized N/A Wound Status: 3x2.8x0.3 0x0x0 N/A Measurements L x W x D (cm) 6.597 0 N/A A (cm) : rea 1.979 0 N/A Volume (cm) : -150.00% 100.00% N/A % Reduction in Area: -649.60% 100.00% N/A % Reduction in Volume: Full Thickness Without Exposed Full  Thickness Without Exposed N/A Classification: Support Structures Support Structures Medium Medium N/A Exudate Amount: Serosanguineous Serosanguineous N/A Exudate Type: red, brown red, brown N/A Exudate Color: Distinct, outline attached Distinct, outline attached N/A Wound Margin: Small (1-33%) None Present (0%) N/A Granulation Amount: Red, Pink N/A N/A Granulation Quality: Large (67-100%) Small (1-33%) N/A Necrotic Amount: Fat Layer (Subcutaneous Tissue): Yes Fat Layer (Subcutaneous Tissue): Yes N/A Exposed Structures: Fascia: No Fascia: No Tendon: No Tendon: No Muscle: No Muscle: No Joint: No Joint: No Bone: No Bone: No Small (1-33%) None N/A  Epithelialization: Biopsy N/A N/A Procedures Performed: Treatment Notes Wound #1 (Wrist) Wound Laterality: Dorsal, Right Cleanser Wound Cleanser Discharge Instruction: Cleanse the wound with wound cleanser prior to applying a clean dressing using gauze sponges, not tissue or cotton balls. Peri-Wound Care Topical Primary Dressing MediHoney Calcium Alginate Dressing 4x5 in Discharge Instruction: Apply to wound bed as instructed Secondary Dressing Woven Gauze Sponge, Non-Sterile 4x4 in Discharge Instruction: Apply over primary dressing as directed. Secured With Conforming Stretch Gauze Bandage, Sterile 2x75 (in/in) Discharge Instruction: Secure with stretch gauze as directed. 32M Medipore H Soft Cloth Surgical T 4 x 2 (in/yd) ape Discharge Instruction: Secure dressing with tape as directed. Compression Wrap Compression Stockings Add-Ons Wound #3 (Hand - Dorsum) Wound Laterality: Right, Lateral Cleanser Peri-Wound Care Topical Primary Dressing Secondary Dressing Secured With Compression Wrap Compression Stockings Add-Ons Electronic Signature(s) Signed: 04/20/2021 5:50:09 PM By: Kalman Shan DO Signed: 04/20/2021 6:31:57 PM By: Deon Pilling Entered By: Kalman Shan on 04/20/2021  17:37:05 -------------------------------------------------------------------------------- Multi-Disciplinary Care Plan Details Patient Name: Date of Service: Dana [name], Dana [name] 04/20/2021 10:45 A M Medical Record Number: [id number] Patient Account Number: [id number] Date of Birth/Sex: Treating RN: [birth year] (86 y.o. [name], Tammi Klippel Primary Care Ayssa Bentivegna: Leitha Bleak Other Clinician: Referring Melika Reder: Treating Horst Ostermiller/Extender: Alean Rinne Weeks in Treatment: 1 [place] reviewed with physician Active Inactive Wound/Skin Impairment Nursing Diagnoses: Impaired tissue integrity Knowledge deficit related to ulceration/compromised skin integrity Goals: Patient/caregiver will verbalize understanding of skin care regimen Date Initiated: 04/10/2021 Target Resolution Date: 05/19/2021 Goal Status: Active Ulcer/skin breakdown will have a volume reduction of 30% by week 4 Date Initiated: 04/10/2021 Target Resolution Date: 05/12/2021 Goal Status: Active Interventions: Assess patient/caregiver ability to obtain necessary supplies Assess patient/caregiver ability to perform ulcer/skin care regimen upon admission and as needed Assess ulceration(s) every visit Provide education on ulcer and skin care Notes: Electronic Signature(s) Signed: 04/20/2021 6:31:57 PM By: Deon Pilling Entered By: Deon Pilling on 04/20/2021 10:56:56 -------------------------------------------------------------------------------- Pain Assessment Details Patient Name: Date of Service: Dana [name], Dana [name] 04/20/2021 10:45 A M Medical Record Number: [id number] Patient Account Number: [id number] Date of Birth/Sex: Treating RN: 1935-06-26 (86 y.o. [name], Tammi Klippel Primary Care Emilyrose Darrah: Leitha Bleak Other Clinician: Referring Viggo Perko: Treating Corwyn Vora/Extender: Alean Rinne Weeks in Treatment: 1 Active Problems Location of Pain Severity and  Description of Pain Patient Has Paino No Site Locations Pain Management and Medication Current Pain Management: Electronic Signature(s) Signed: 04/20/2021 6:31:57 PM By: Deon Pilling Signed: 04/21/2021 4:35:52 PM By: Sandre Kitty Entered By: Sandre Kitty on 04/20/2021 10:45:24 -------------------------------------------------------------------------------- Patient/Caregiver Education Details Patient Name: Date of Service: Dana [name] 4/28/2022andnbsp10:45 A M Medical Record Number: [id number] Patient Account Number: [id number] Date of Birth/Gender: Treating RN: 07-03-1935 (86 y.o. [name], Tammi Klippel Primary Care Physician: Leitha Bleak Other Clinician: Referring Physician: Treating Physician/Extender: Alean Rinne Weeks in Treatment: 1 Education Assessment Education Provided To: Patient Education Topics Provided Wound/Skin Impairment: Handouts: Skin Care Do's and Dont's Methods: Explain/Verbal Responses: Reinforcements needed Electronic Signature(s) Signed: 04/20/2021 6:31:57 PM By: Deon Pilling Entered By: Deon Pilling on 04/20/2021 10:57:06 -------------------------------------------------------------------------------- Wound Assessment Details Patient Name: Date of Service: Dana [name], Dana [name] 04/20/2021 10:45 A M Medical Record Number: [id number] Patient Account Number: [id number] Date of Birth/Sex: Treating RN: November 18, 1935 (86 y.o. [name] Primary Care Kingson Lohmeyer: Leitha Bleak Other Clinician: Referring Jaretssi Kraker: Treating Holland Kotter/Extender: Alean Rinne Weeks in Treatment: 1 Wound Status Wound Number: 1 Primary Atypical Etiology: Wound  Location: Right, Dorsal Wrist Wound Open Wounding Event: Gradually Appeared Status: Date Acquired: 04/06/2021 Comorbid Chronic Obstructive Pulmonary Disease (COPD), Hypertension, Weeks Of Treatment: 1 History: Rheumatoid Arthritis, Received Chemotherapy,  Received Radiation Clustered Wound: No Photos Wound Measurements Length: (cm) 3 Width: (cm) 2.8 Depth: (cm) 0.3 Area: (cm) 6.597 Volume: (cm) 1.979 % Reduction in Area: -150% % Reduction in Volume: -649.6% Epithelialization: Small (1-33%) Tunneling: No Undermining: No Wound Description Classification: Full Thickness Without Exposed Support Structures Wound Margin: Distinct, outline attached Exudate Amount: Medium Exudate Type: Serosanguineous Exudate Color: red, brown Foul Odor After Cleansing: No Slough/Fibrino Yes Wound Bed Granulation Amount: Small (1-33%) Exposed Structure Granulation Quality: Red, Pink Fascia Exposed: No Necrotic Amount: Large (67-100%) Fat Layer (Subcutaneous Tissue) Exposed: Yes Necrotic Quality: Adherent Slough Tendon Exposed: No Muscle Exposed: No Joint Exposed: No Bone Exposed: No Electronic Signature(s) Signed: 04/20/2021 6:31:57 PM By: Deon Pilling Signed: 04/21/2021 4:35:52 PM By: Sandre Kitty Entered By: Sandre Kitty on 04/20/2021 16:26:29 -------------------------------------------------------------------------------- Wound Assessment Details Patient Name: Date of Service: Dana [name] 04/20/2021 10:45 A M Medical Record Number: [id number] Patient Account Number: [id number] Date of Birth/Sex: Treating RN: Oct 14, 1935 (86 y.o. [name], Meta.Reding Primary Care Desman Polak: Leitha Bleak Other Clinician: Referring Mariapaula Krist: Treating Bond Grieshop/Extender: Alean Rinne Weeks in Treatment: 1 Wound Status Wound Number: 3 Primary Atypical Etiology: Wound Location: Right, Lateral Hand - Dorsum Wound Healed - Epithelialized Wounding Event: Gradually Appeared Status: Date Acquired: 04/07/2021 Comorbid Chronic Obstructive Pulmonary Disease (COPD), Hypertension, Weeks Of Treatment: 1 History: Rheumatoid Arthritis, Received Chemotherapy, Received Radiation Clustered Wound: No Wound Measurements Length:  (cm) Width: (cm) Depth: (cm) Area: (cm) Volume: (cm) 0 % Reduction in Area: 100% 0 % Reduction in Volume: 100% 0 Epithelialization: None 0 Tunneling: No 0 Undermining: No Wound Description Classification: Full Thickness Without Exposed Support Structures Wound Margin: Distinct, outline attached Exudate Amount: Medium Exudate Type: Serosanguineous Exudate Color: red, brown Foul Odor After Cleansing: No Slough/Fibrino Yes Wound Bed Granulation Amount: None Present (0%) Exposed Structure Necrotic Amount: Small (1-33%) Fascia Exposed: No Fat Layer (Subcutaneous Tissue) Exposed: Yes Tendon Exposed: No Muscle Exposed: No Joint Exposed: No Bone Exposed: No Treatment Notes Wound #3 (Hand - Dorsum) Wound Laterality: Right, Lateral Cleanser Peri-Wound Care Topical Primary Dressing Secondary Dressing Secured With Compression Wrap Compression Stockings Add-Ons Electronic Signature(s) Signed: 04/20/2021 6:31:57 PM By: Deon Pilling Entered By: Deon Pilling on 04/20/2021 11:40:12 -------------------------------------------------------------------------------- Vitals Details Patient Name: Date of Service: Dana [name] 04/20/2021 10:45 A M Medical Record Number: [id number] Patient Account Number: [id number] Date of Birth/Sex: Treating RN: [birth year] (86 y.o. [name], Meta.Reding Primary Care Adlai Sinning: Leitha Bleak Other Clinician: Referring Jerrol Helmers: Treating Betha Shadix/Extender: Alean Rinne Weeks in Treatment: 1 Vital Signs Time Taken: 10:43 Temperature (F): 97.9 Height (in): 64 Pulse (bpm): 97 Weight (lbs): 110 Respiratory Rate (breaths/min): 20 Body Mass Index (BMI): 18.9 Blood Pressure (mmHg): 168/89 Reference Range: 80 - 120 mg / dl Electronic Signature(s) Signed: 04/21/2021 4:35:52 PM By: Sandre Kitty Entered By: Sandre Kitty on 04/20/2021 10:45:15

## 2021-04-22 ENCOUNTER — Other Ambulatory Visit: Payer: Self-pay | Admitting: Internal Medicine

## 2021-04-22 DIAGNOSIS — C3491 Malignant neoplasm of unspecified part of right bronchus or lung: Secondary | ICD-10-CM

## 2021-04-25 DIAGNOSIS — Z85828 Personal history of other malignant neoplasm of skin: Secondary | ICD-10-CM | POA: Diagnosis not present

## 2021-04-25 DIAGNOSIS — L98499 Non-pressure chronic ulcer of skin of other sites with unspecified severity: Secondary | ICD-10-CM | POA: Diagnosis not present

## 2021-04-27 ENCOUNTER — Encounter (HOSPITAL_BASED_OUTPATIENT_CLINIC_OR_DEPARTMENT_OTHER): Payer: Medicare Other | Admitting: Internal Medicine

## 2021-05-01 ENCOUNTER — Encounter (HOSPITAL_BASED_OUTPATIENT_CLINIC_OR_DEPARTMENT_OTHER): Payer: Medicare Other | Admitting: Internal Medicine

## 2021-05-01 DIAGNOSIS — Z85828 Personal history of other malignant neoplasm of skin: Secondary | ICD-10-CM | POA: Diagnosis not present

## 2021-05-01 DIAGNOSIS — L98499 Non-pressure chronic ulcer of skin of other sites with unspecified severity: Secondary | ICD-10-CM | POA: Diagnosis not present

## 2021-05-02 ENCOUNTER — Encounter (HOSPITAL_BASED_OUTPATIENT_CLINIC_OR_DEPARTMENT_OTHER): Payer: Medicare Other | Attending: Internal Medicine | Admitting: Internal Medicine

## 2021-05-02 ENCOUNTER — Other Ambulatory Visit: Payer: Self-pay

## 2021-05-02 DIAGNOSIS — S61501A Unspecified open wound of right wrist, initial encounter: Secondary | ICD-10-CM | POA: Insufficient documentation

## 2021-05-02 DIAGNOSIS — L98498 Non-pressure chronic ulcer of skin of other sites with other specified severity: Secondary | ICD-10-CM | POA: Insufficient documentation

## 2021-05-02 DIAGNOSIS — M06031 Rheumatoid arthritis without rheumatoid factor, right wrist: Secondary | ICD-10-CM | POA: Insufficient documentation

## 2021-05-02 DIAGNOSIS — X58XXXA Exposure to other specified factors, initial encounter: Secondary | ICD-10-CM | POA: Diagnosis not present

## 2021-05-02 DIAGNOSIS — L929 Granulomatous disorder of the skin and subcutaneous tissue, unspecified: Secondary | ICD-10-CM | POA: Insufficient documentation

## 2021-05-02 DIAGNOSIS — L98492 Non-pressure chronic ulcer of skin of other sites with fat layer exposed: Secondary | ICD-10-CM | POA: Diagnosis not present

## 2021-05-02 NOTE — Progress Notes (Signed)
Dana [name], Dana [name] ([id number]) Visit Report for 05/02/2021 HPI Details Patient Name: Date of Service: [name], [name] 05/02/2021 2:45 PM Medical Record Number: [id number] Patient Account Number: [id number] Date of Birth/Sex: Dana [name]: Dec 26, 1934 (86 y.o. Dana [name] Primary Care Provider: Leitha Bleak Other Clinician: Referring Provider: Treating Provider/Extender: Carola Frost Weeks in Treatment: 3 History of Present Illness HPI Description: ADMISSION 04/10/2021 This is a 86 year old woman who was worked in urgently today at the request of her family. Apparently they saw an area on her right medial wrist at roughly the head of the ulna. This came on about 5 days ago the patient describes a small spot that is rapidly expanded into a wound with a necrotic surface. She has smaller areas on the medial aspect of the arm and also the medial aspect of the hand however they do not have been necrotic surfaces. She does not describe a lot of pain. The patient has a long history of problems in this wrist and hand. This was extensively worked up by Dr. Ronalee Red at [facility] in 2018 with multiple biopsies diagnosed granulomatous dermatitis. Cultures were negative for fungus and Mycobacterium at that point she was started on methotrexate. The patient has not been back there since she is followed instead with Dr. Fontaine No at [facility] dermatology. Patient states that she has had wounds in this area that look like this before and she will be prescribed cream and it will eventually get better. Past medical history has very little. She follows with Dr. [name] at oncology for stage IIIb non-small cell lung cancer. She has had this for several years. She is currently on T arceva which appears to have kept this under control. She also apparently has seronegative rheumatoid arthritis which is felt to be affecting her right wrist she follows with rheumatology. 4/25; patient presents  as a nurse visit but is concerned about the surrounding erythema to the wound and would like to be seen by a physician. Patient states that she was recently seen by dermatology for her atypical wound and started on doxycycline and prednisone. She states that once she stopped the prednisone the erythema returned to the surrounding wound bed. Patient states that this has been an ongoing issue for the past 7 years. She states the wound heals but then opened spontaneously. It spontaneously opened about 2 weeks ago. She denies any drainage, fever/chills or increased warmth to the wound site. 4/28; she was last seen 3 days ago. She had a wound that has been increasing in size significantly over the past week. She was started on prednisone by her dermatologist and had finished that and I continued this on for a few days to see if it continued to improve the wound appearance and size. Today she states that there is less swelling and erythema to the surrounding wound bed. She has a dermatology appointment on 5/3. She overall thinks the wound is stable. 5/10; I admitted this patient to clinic but have not seen her since. She has an expanding area on the dorsal medial right wrist. Since that time she was biopsied by Dr. Heber [place] that did not show changes of rheumatoid or any other vasculitis. Dermatology was doing a dilute vinegar soak and Vaseline with gauze. She was given a course of prednisone and doxycycline however the wound is continued to expand. Yesterday Dr. Gwenyth Ober gave her a prescription for cyclosporine and Restasis in the wound after vinegar soaks then betamethasone ointment Electronic Signature(s) Signed: 05/02/2021  4:50:17 PM By: Linton Ham MD Entered By: Linton Ham on 05/02/2021 15:53:29 -------------------------------------------------------------------------------- Physical Exam Details Patient Name: Date of Service: Dana, [name] 05/02/2021 2:45 PM Medical Record Number:  [id number] Patient Account Number: [id number] Date of Birth/Sex: Dana [name]: 1935/12/22 (86 y.o. Dana [name] Primary Care Provider: Leitha Bleak Other Clinician: Referring Provider: Treating Provider/Extender: Carola Frost Weeks in Treatment: 3 Constitutional Sitting or standing Blood Pressure is within target range for patient.. Pulse regular and within target range for patient.Marland Kitchen Respirations regular, non-labored and within target range.. Temperature is normal and within the target range for the patient.Marland Kitchen Appears in no distress. Notes Wound exam; right dorsal wrist this does not look good much larger than when I first saw this. There is absolutely no viable tissue here however now she has exposed tendon which are the extensors of her fifth MCP. Her radial pulses are palpable. There is no erythema around the wound Electronic Signature(s) Signed: 05/02/2021 4:50:17 PM By: Linton Ham MD Entered By: Linton Ham on 05/02/2021 15:54:41 -------------------------------------------------------------------------------- Physician Orders Details Patient Name: Date of Service: ALLEA, KASSNER 05/02/2021 2:45 PM Medical Record Number: [id number] Patient Account Number: [id number] Date of Birth/Sex: Dana [name]: 10/28/1935 (86 y.o. Dana [name] Primary Care Provider: Leitha Bleak Other Clinician: Referring Provider: Treating Provider/Extender: Carola Frost Weeks in Treatment: 3 Verbal / Phone Orders: No Diagnosis Coding Discharge From [facility] Services Discharge from [place] - Patient to closely follow with dermatology. Additional Orders / Instructions Other: - Patient to continue instructions from dermatology with dressing changes. Wound Treatment Wound #1 - Wrist Wound Laterality: Dorsal, Right Cleanser: Soap and Water Every Other Day Discharge Instructions: May shower and wash wound with dial antibacterial soap and  water prior to dressing change. Prim Dressing: Promogran Prisma Matrix, 4.34 (sq in) (silver collagen) Every Other Day ary Discharge Instructions: ***In clinic today.***Moisten collagen with hydrogel or [place] Jelly. Secondary Dressing: Woven Gauze Sponge, Non-Sterile 4x4 in Every Other Day Discharge Instructions: ***In clinic today.***Apply over primary dressing as directed. Secured With: Child psychotherapist, Sterile 2x75 (in/in) Every Other Day Discharge Instructions: ***In clinic today.***Secure with stretch gauze as directed. Electronic Signature(s) Signed: 05/02/2021 4:50:17 PM By: Linton Ham MD Signed: 05/02/2021 6:10:24 PM By: Deon Pilling Entered By: Deon Pilling on 05/02/2021 15:43:18 -------------------------------------------------------------------------------- Problem List Details Patient Name: Date of Service: BRAYLA, PAT 05/02/2021 2:45 PM Medical Record Number: [id number] Patient Account Number: [id number] Date of Birth/Sex: Dana [name]: 1935-10-26 (86 y.o. [name], Tammi Klippel Primary Care Provider: Leitha Bleak Other Clinician: Referring Provider: Treating Provider/Extender: Carola Frost Weeks in Treatment: 3 Active Problems ICD-10 Encounter Code Description Active Date MDM Diagnosis L98.498 Non-pressure chronic ulcer of skin of other sites with other specified severity 04/10/2021 No Yes S61.501S Unspecified open wound of right wrist, sequela 04/10/2021 No Yes L92.9 Granulomatous disorder of the skin and subcutaneous tissue, unspecified 04/10/2021 No Yes M06.031 Rheumatoid arthritis without rheumatoid factor, right wrist 04/10/2021 No Yes Inactive Problems Resolved Problems Electronic Signature(s) Signed: 05/02/2021 4:50:17 PM By: Linton Ham MD Entered By: Linton Ham on 05/02/2021 15:49:39 -------------------------------------------------------------------------------- Progress Note Details Patient Name: Date of  Service: Linnell Fulling 05/02/2021 2:45 PM Medical Record Number: [id number] Patient Account Number: [id number] Date of Birth/Sex: Dana [name]: 02-28-1935 (86 y.o. [name], Tammi Klippel Primary Care Provider: Leitha Bleak Other Clinician: Referring Provider: Treating Provider/Extender: Carola Frost Weeks in Treatment: 3 Subjective History of  Present Illness (HPI) ADMISSION 04/10/2021 This is a 86 year old woman who was worked in urgently today at the request of her family. Apparently they saw an area on her right medial wrist at roughly the head of the ulna. This came on about 5 days ago the patient describes a small spot that is rapidly expanded into a wound with a necrotic surface. She has smaller areas on the medial aspect of the arm and also the medial aspect of the hand however they do not have been necrotic surfaces. She does not describe a lot of pain. The patient has a long history of problems in this wrist and hand. This was extensively worked up by Dr. Ronalee Red at [facility] in 2018 with multiple biopsies diagnosed granulomatous dermatitis. Cultures were negative for fungus and Mycobacterium at that point she was started on methotrexate. The patient has not been back there since she is followed instead with Dr. Fontaine No at [facility] dermatology. Patient states that she has had wounds in this area that look like this before and she will be prescribed cream and it will eventually get better. Past medical history has very little. She follows with Dr. [name] at oncology for stage IIIb non-small cell lung cancer. She has had this for several years. She is currently on T arceva which appears to have kept this under control. She also apparently has seronegative rheumatoid arthritis which is felt to be affecting her right wrist she follows with rheumatology. 4/25; patient presents as a nurse visit but is concerned about the surrounding erythema to the wound and would like  to be seen by a physician. Patient states that she was recently seen by dermatology for her atypical wound and started on doxycycline and prednisone. She states that once she stopped the prednisone the erythema returned to the surrounding wound bed. Patient states that this has been an ongoing issue for the past 7 years. She states the wound heals but then opened spontaneously. It spontaneously opened about 2 weeks ago. She denies any drainage, fever/chills or increased warmth to the wound site. 4/28; she was last seen 3 days ago. She had a wound that has been increasing in size significantly over the past week. She was started on prednisone by her dermatologist and had finished that and I continued this on for a few days to see if it continued to improve the wound appearance and size. Today she states that there is less swelling and erythema to the surrounding wound bed. She has a dermatology appointment on 5/3. She overall thinks the wound is stable. 5/10; I admitted this patient to clinic but have not seen her since. She has an expanding area on the dorsal medial right wrist. Since that time she was biopsied by Dr. Heber [place] that did not show changes of rheumatoid or any other vasculitis. Dermatology was doing a dilute vinegar soak and Vaseline with gauze. She was given a course of prednisone and doxycycline however the wound is continued to expand. Yesterday Dr. Gwenyth Ober gave her a prescription for cyclosporine and Restasis in the wound after vinegar soaks then betamethasone ointment Objective Constitutional Sitting or standing Blood Pressure is within target range for patient.. Pulse regular and within target range for patient.Marland Kitchen Respirations regular, non-labored and within target range.. Temperature is normal and within the target range for the patient.Marland Kitchen Appears in no distress. Vitals Time Taken: 3:21 PM, Height: 64 in, Weight: 110 lbs, BMI: 18.9, Temperature: 98.2 F, Pulse: 74 bpm,  Respiratory Rate: 20 breaths/min, Blood Pressure:  126/70 mmHg. General Notes: Wound exam; right dorsal wrist this does not look good much larger than when I first saw this. There is absolutely no viable tissue here however now she has exposed tendon which are the extensors of her fifth MCP. Her radial pulses are palpable. There is no erythema around the wound Integumentary (Hair, Skin) Wound #1 status is Open. Original cause of wound was Gradually Appeared. The date acquired was: 04/06/2021. The wound has been in treatment 3 weeks. The wound is located on the Right,Dorsal Wrist. The wound measures 3.5cm length x 3.5cm width x 0.3cm depth; 9.621cm^2 area and 2.886cm^3 volume. There is tendon and Fat Layer (Subcutaneous Tissue) exposed. There is no tunneling or undermining noted. There is a medium amount of purulent drainage noted. The wound margin is well defined and not attached to the wound base. There is small (1-33%) pink, pale granulation within the wound bed. There is a large (67-100%) amount of necrotic tissue within the wound bed including Adherent Slough. Assessment Active Problems ICD-10 Non-pressure chronic ulcer of skin of other sites with other specified severity Unspecified open wound of right wrist, sequela Granulomatous disorder of the skin and subcutaneous tissue, unspecified Rheumatoid arthritis without rheumatoid factor, right wrist Plan Discharge From [facility] Services: Discharge from [place] - Patient to closely follow with dermatology. Additional Orders / Instructions: Other: - Patient to continue instructions from dermatology with dressing changes. WOUND #1: - Wrist Wound Laterality: Dorsal, Right Cleanser: Soap and Water Every Other Day/ Discharge Instructions: May shower and wash wound with dial antibacterial soap and water prior to dressing change. Prim Dressing: Promogran Prisma Matrix, 4.34 (sq in) (silver collagen) Every Other Day/ ary Discharge  Instructions: ***In clinic today.***Moisten collagen with hydrogel or [place] Jelly. Secondary Dressing: Woven Gauze Sponge, Non-Sterile 4x4 in Every Other Day/ Discharge Instructions: ***In clinic today.***Apply over primary dressing as directed. Secured With: Child psychotherapist, Sterile 2x75 (in/in) Every Other Day/ Discharge Instructions: ***In clinic today.***Secure with stretch gauze as directed. 1. This is a rapidly expanding wound much larger than when I saw this 2. she now has exposed tendon the rest of the wound has necrotic surface but I did not want to debride this mechanically 3. I agree with the cyclosporine and the topical steroids. This must be some form of inflammatory ulcer that is expanding. I still wonder about vasculitis although this was not shown on the biopsy result we did. 4. I do not see the point in her following here but I have strongly advised continue follow-up at [facility] dermatology Electronic Signature(s) Signed: 05/02/2021 4:50:17 PM By: Linton Ham MD Entered By: Linton Ham on 05/02/2021 15:56:12 -------------------------------------------------------------------------------- SuperBill Details Patient Name: Date of Service: SEPHORA, BOYAR 05/02/2021 Medical Record Number: [id number] Patient Account Number: [id number] Date of Birth/Sex: Dana [name]: 02-Jan-1935 (86 y.o. Dana [name] Primary Care Provider: Leitha Bleak Other Clinician: Referring Provider: Treating Provider/Extender: Carola Frost Weeks in Treatment: 3 Diagnosis Coding ICD-10 Codes Code Description [phone] Non-pressure chronic ulcer of skin of other sites with other specified severity S61.501S Unspecified open wound of right wrist, sequela L92.9 Granulomatous disorder of the skin and subcutaneous tissue, unspecified M06.031 Rheumatoid arthritis without rheumatoid factor, right wrist Facility Procedures CPT4 Code: [id number] Description:  99213 - WOUND CARE VISIT-LEV 3 EST PT Modifier: Quantity: 1 Physician Procedures : CPT4 Code Description Modifier [id number] - WC PHYS LEVEL 3 - EST PT ICD-10 Diagnosis Description L98.498 Non-pressure chronic ulcer of skin of other  sites with other specified severity S61.501S Unspecified open wound of right wrist, sequela Quantity: 1 Electronic Signature(s) Signed: 05/02/2021 4:50:17 PM By: Linton Ham MD Entered By: Linton Ham on 05/02/2021 15:56:43

## 2021-05-03 NOTE — Progress Notes (Signed)
Dana [name], Dana [name] ([id number]) Visit Report for 05/02/2021 Arrival Information Details Patient Name: Date of Service: Dana [name], Dana [name] 05/02/2021 2:45 PM Medical Record Number: [id number] Patient Account Number: [id number] Date of Birth/Sex: Treating RN: 24-Sep-1935 (86 y.o. [name], Tammi Klippel Primary Care Airyana Sprunger: Leitha Bleak Other Clinician: Referring Caylin Raby: Treating Fritz Cauthon/Extender: Carola Frost Weeks in Treatment: 3 Visit Information History Since Last Visit Added or deleted any medications: No Patient Arrived: Dana [name] Any new allergies or adverse reactions: No Arrival Time: 15:20 Had a fall or experienced change in No Accompanied By: self activities of daily living that may affect Transfer Assistance: None risk of falls: Patient Identification Verified: Yes Signs or symptoms of abuse/neglect since last visito No Secondary Verification Process Completed: Yes Hospitalized since last visit: No Patient Requires Transmission-Based Precautions: No Implantable device outside of the clinic excluding No Patient Has Alerts: No cellular tissue based products placed in the center since last visit: Has Dressing in Place as Prescribed: Yes Pain Present Now: Yes Electronic Signature(s) Signed: 05/02/2021 4:16:21 PM By: Lorrin Jackson Entered By: Lorrin Jackson on 05/02/2021 16:16:21 -------------------------------------------------------------------------------- Clinic Level of Care Assessment Details Patient Name: Date of Service: Dana [name] 05/02/2021 2:45 PM Medical Record Number: [id number] Patient Account Number: [id number] Date of Birth/Sex: Treating RN: 03/20/1935 (86 y.o. [name], Meta.Reding Primary Care [name]: Leitha Bleak Other Clinician: Referring Durant Scibilia: Treating Cristal Howatt/Extender: Carola Frost Weeks in Treatment: 3 Clinic Level of Care Assessment Items TOOL 4 Quantity Score X- 1 0 Use when only an EandM is  performed on FOLLOW-UP visit ASSESSMENTS - Nursing Assessment / Reassessment X- 1 10 Reassessment of Co-morbidities (includes updates in patient status) X- 1 5 Reassessment of Adherence to Treatment Plan ASSESSMENTS - Wound and Skin A ssessment / Reassessment X - Simple Wound Assessment / Reassessment - one wound 1 5 []  - 0 Complex Wound Assessment / Reassessment - multiple wounds X- 1 10 Dermatologic / Skin Assessment (not related to wound area) ASSESSMENTS - Focused Assessment []  - 0 Circumferential Edema Measurements - multi extremities X- 1 10 Nutritional Assessment / Counseling / Intervention []  - 0 Lower Extremity Assessment (monofilament, tuning fork, pulses) []  - 0 Peripheral Arterial Disease Assessment (using hand held doppler) ASSESSMENTS - Ostomy and/or Continence Assessment and Care []  - 0 Incontinence Assessment and Management []  - 0 Ostomy Care Assessment and Management (repouching, etc.) PROCESS - Coordination of Care X - Simple Patient / Family Education for ongoing care 1 15 []  - 0 Complex (extensive) Patient / Family Education for ongoing care X- 1 10 Staff obtains Programmer, systems, Records, T Results / Process Orders est []  - 0 Staff telephones HHA, Nursing Homes / Clarify orders / etc []  - 0 Routine Transfer to another Facility (non-emergent condition) []  - 0 Routine Hospital Admission (non-emergent condition) []  - 0 New Admissions / Biomedical engineer / Ordering NPWT Apligraf, etc. , []  - 0 Emergency Hospital Admission (emergent condition) X- 1 10 Simple Discharge Coordination []  - 0 Complex (extensive) Discharge Coordination PROCESS - Special Needs []  - 0 Pediatric / Minor Patient Management []  - 0 Isolation Patient Management []  - 0 Hearing / Language / Visual special needs []  - 0 Assessment of Community assistance (transportation, D/C planning, etc.) []  - 0 Additional assistance / Altered mentation []  - 0 Support Surface(s) Assessment  (bed, cushion, seat, etc.) INTERVENTIONS - Wound Cleansing / Measurement X - Simple Wound Cleansing - one wound 1 5 []  - 0 Complex Wound Cleansing -  multiple wounds X- 1 5 Wound Imaging (photographs - any number of wounds) []  - 0 Wound Tracing (instead of photographs) X- 1 5 Simple Wound Measurement - one wound []  - 0 Complex Wound Measurement - multiple wounds INTERVENTIONS - Wound Dressings X - Small Wound Dressing one or multiple wounds 1 10 []  - 0 Medium Wound Dressing one or multiple wounds []  - 0 Large Wound Dressing one or multiple wounds []  - 0 Application of Medications - topical []  - 0 Application of Medications - injection INTERVENTIONS - Miscellaneous []  - 0 External ear exam []  - 0 Specimen Collection (cultures, biopsies, blood, body fluids, etc.) []  - 0 Specimen(s) / Culture(s) sent or taken to Lab for analysis []  - 0 Patient Transfer (multiple staff / Civil Service fast streamer / Similar devices) []  - 0 Simple Staple / Suture removal (25 or less) []  - 0 Complex Staple / Suture removal (26 or more) []  - 0 Hypo / Hyperglycemic Management (close monitor of Blood Glucose) []  - 0 Ankle / Brachial Index (ABI) - do not check if billed separately X- 1 5 Vital Signs Has the patient been seen at the hospital within the last three years: Yes Total Score: 105 Level Of Care: New/Established - Level 3 Electronic Signature(s) Signed: 05/02/2021 6:10:24 PM By: Deon Pilling Entered By: Deon Pilling on 05/02/2021 15:43:50 -------------------------------------------------------------------------------- Encounter Discharge Information Details Patient Name: Date of Service: Dana [name] 05/02/2021 2:45 PM Medical Record Number: [id number] Patient Account Number: [id number] Date of Birth/Sex: Treating RN: 11-01-1935 (86 y.o. [name] Primary Care Rakayla Ricklefs: Leitha Bleak Other Clinician: Referring Denaya Horn: Treating Chelby Salata/Extender: Carola Frost Weeks in Treatment: 3 Encounter Discharge Information Items Discharge Condition: Stable Ambulatory Status: [place] Discharge Destination: Home Transportation: Private Auto Schedule Follow-up Appointment: Yes Clinical Summary of Care: Provided on 05/02/2021 Form Type Recipient Paper Patient Patient Electronic Signature(s) Signed: 05/02/2021 4:16:58 PM By: Lorrin Jackson Entered By: Lorrin Jackson on 05/02/2021 16:16:58 -------------------------------------------------------------------------------- Lower Extremity Assessment Details Patient Name: Date of Service: Dana [name], Dana [name] 05/02/2021 2:45 PM Medical Record Number: [id number] Patient Account Number: [id number] Date of Birth/Sex: Treating RN: 22-Aug-1935 (86 y.o. [name] Primary Care Aissa Lisowski: Leitha Bleak Other Clinician: Referring Shiree Altemus: Treating Satoru Milich/Extender: Carola Frost Weeks in Treatment: 3 Electronic Signature(s) Signed: 05/02/2021 5:40:35 PM By: Lorrin Jackson Entered By: Lorrin Jackson on 05/02/2021 15:27:23 -------------------------------------------------------------------------------- Multi Wound Chart Details Patient Name: Date of Service: Dana [name], Dana [name] 05/02/2021 2:45 PM Medical Record Number: [id number] Patient Account Number: [id number] Date of Birth/Sex: Treating RN: 29-May-1935 (86 y.o. [name], Meta.Reding Primary Care Kania Regnier: Leitha Bleak Other Clinician: Referring Kiam Bransfield: Treating Grabiela Wohlford/Extender: Carola Frost Weeks in Treatment: 3 Vital Signs Height(in): 9 Pulse(bpm): 1 Weight(lbs): 110 Blood Pressure(mmHg): 126/70 Body Mass Index(BMI): 19 Temperature(F): 98.2 Respiratory Rate(breaths/min): 20 Photos: [1:No Photos Right, Dorsal Wrist] [N/A:N/A N/A] Wound Location: [1:Gradually Appeared] [N/A:N/A] Wounding Event: [1:Atypical] [N/A:N/A] Primary Etiology: [1:Chronic Obstructive Pulmonary] [N/A:N/A] Comorbid History:  [1:Disease (COPD), Hypertension, Rheumatoid Arthritis, Received Chemotherapy, Received Radiation 04/06/2021] [N/A:N/A] Date Acquired: [1:3] [N/A:N/A] Weeks of Treatment: [1:Open] [N/A:N/A] Wound Status: [1:3.5x3.5x0.3] [N/A:N/A] Measurements L x W x D (cm) [1:9.621] [N/A:N/A] A (cm) : rea [1:2.886] [N/A:N/A] Volume (cm) : [1:-264.60%] [N/A:N/A] % Reduction in Area: [1:-993.20%] [N/A:N/A] % Reduction in Volume: [1:Full Thickness With Exposed Support N/A] Classification: [1:Structures Medium] [N/A:N/A] Exudate Amount: [1:Purulent] [N/A:N/A] Exudate Type: [1:yellow, brown, green] [N/A:N/A] Exudate Color: [1:Well defined, not attached] [N/A:N/A] Wound Margin: [1:Small (1-33%)] [N/A:N/A] Granulation  Amount: [1:Pink, Pale] [N/A:N/A] Granulation Quality: [1:Large (67-100%)] [N/A:N/A] Necrotic Amount: [1:Fat Layer (Subcutaneous Tissue): Yes N/A] Exposed Structures: [1:Tendon: Yes Fascia: No Muscle: No Joint: No Bone: No Small (1-33%)] [N/A:N/A] Treatment Notes Electronic Signature(s) Signed: 05/02/2021 4:50:17 PM By: Linton Ham MD Signed: 05/02/2021 6:10:24 PM By: Deon Pilling Entered By: Linton Ham on 05/02/2021 15:49:45 -------------------------------------------------------------------------------- Multi-Disciplinary Care Plan Details Patient Name: Date of Service: Dana [name], Dana [name] 05/02/2021 2:45 PM Medical Record Number: [id number] Patient Account Number: [id number] Date of Birth/Sex: Treating RN: [birth year] (86 y.o. [name], Tammi Klippel Primary Care Nanetta Wiegman: Leitha Bleak Other Clinician: Referring Luisantonio Adinolfi: Treating Jalyssa Fleisher/Extender: Leonard Downing in Treatment: 3 Multidisciplinary Care Plan reviewed with physician Active Inactive Electronic Signature(s) Signed: 05/02/2021 6:10:24 PM By: Deon Pilling Entered By: Deon Pilling on 05/02/2021 15:49:23 -------------------------------------------------------------------------------- Pain  Assessment Details Patient Name: Date of Service: Dana [name], Dana [name] 05/02/2021 2:45 PM Medical Record Number: [id number] Patient Account Number: [id number] Date of Birth/Sex: Treating RN: 11-21-1935 (86 y.o. [name] Primary Care Teresita Fanton: Leitha Bleak Other Clinician: Referring Eliane Hammersmith: Treating Tava Peery/Extender: Carola Frost Weeks in Treatment: 3 Active Problems Location of Pain Severity and Description of Pain Patient Has Paino Yes Site Locations Rate the pain. Current Pain Level: 8 Pain Management and Medication Current Pain Management: Electronic Signature(s) Signed: 05/02/2021 6:10:24 PM By: Deon Pilling Signed: 05/03/2021 9:46:23 AM By: Sandre Kitty Entered By: Sandre Kitty on 05/02/2021 15:23:39 -------------------------------------------------------------------------------- Patient/Caregiver Education Details Patient Name: Date of Service: Dana [name] 5/10/2022andnbsp2:45 PM Medical Record Number: [id number] Patient Account Number: [id number] Date of Birth/Gender: Treating RN: 01-29-1935 (86 y.o. [name], Tammi Klippel Primary Care Physician: Leitha Bleak Other Clinician: Referring Physician: Treating Physician/Extender: Leonard Downing in Treatment: 3 Education Assessment Education Provided To: Patient Education Topics Provided Wound/Skin Impairment: Handouts: Skin Care Do's and Dont's Methods: Explain/Verbal Responses: Reinforcements needed Electronic Signature(s) Signed: 05/02/2021 6:10:24 PM By: Deon Pilling Entered By: Deon Pilling on 05/02/2021 15:39:34 -------------------------------------------------------------------------------- Wound Assessment Details Patient Name: Date of Service: Dana [name], Dana [name] 05/02/2021 2:45 PM Medical Record Number: [id number] Patient Account Number: [id number] Date of Birth/Sex: Treating RN: 09/29/1935 (86 y.o. [name] Primary Care  Arrielle Mcginn: Leitha Bleak Other Clinician: Referring Aylanie Cubillos: Treating Maleeyah Mccaughey/Extender: Carola Frost Weeks in Treatment: 3 Wound Status Wound Number: 1 Primary Atypical Etiology: Wound Location: Right, Dorsal Wrist Wound Open Wounding Event: Gradually Appeared Status: Date Acquired: 04/06/2021 Comorbid Chronic Obstructive Pulmonary Disease (COPD), Hypertension, Weeks Of Treatment: 3 History: Rheumatoid Arthritis, Received Chemotherapy, Received Radiation Clustered Wound: No Photos Wound Measurements Length: (cm) 3.5 Width: (cm) 3.5 Depth: (cm) 0.3 Area: (cm) 9.621 Volume: (cm) 2.886 % Reduction in Area: -264.6% % Reduction in Volume: -993.2% Epithelialization: Small (1-33%) Tunneling: No Undermining: No Wound Description Classification: Full Thickness With Exposed Support Structures Wound Margin: Well defined, not attached Exudate Amount: Medium Exudate Type: Purulent Exudate Color: yellow, brown, green Foul Odor After Cleansing: No Slough/Fibrino Yes Wound Bed Granulation Amount: Small (1-33%) Exposed Structure Granulation Quality: Pink, Pale Fascia Exposed: No Necrotic Amount: Large (67-100%) Fat Layer (Subcutaneous Tissue) Exposed: Yes Necrotic Quality: Adherent Slough Tendon Exposed: Yes Muscle Exposed: No Joint Exposed: No Bone Exposed: No Treatment Notes Wound #1 (Wrist) Wound Laterality: Dorsal, Right Cleanser Soap and Water Discharge Instruction: May shower and wash wound with dial antibacterial soap and water prior to dressing change. Peri-Wound Care Topical Primary Dressing Promogran Prisma Matrix, 4.34 (sq in) (silver collagen) Discharge Instruction: ***In clinic today.***Moisten  collagen with hydrogel or KY Jelly. Secondary Dressing Woven Gauze Sponge, Non-Sterile 4x4 in Discharge Instruction: ***In clinic today.***Apply over primary dressing as directed. Secured With Conforming Stretch Gauze Bandage, Sterile 2x75  (in/in) Discharge Instruction: ***In clinic today.***Secure with stretch gauze as directed. Compression Wrap Compression Stockings Add-Ons Electronic Signature(s) Signed: 05/02/2021 5:40:35 PM By: Lorrin Jackson Signed: 05/03/2021 9:46:23 AM By: Sandre Kitty Entered By: Sandre Kitty on 05/02/2021 16:44:17 -------------------------------------------------------------------------------- Vitals Details Patient Name: Date of Service: Dana [name], Dana [name] 05/02/2021 2:45 PM Medical Record Number: [id number] Patient Account Number: [id number] Date of Birth/Sex: Treating RN: 10/16/1935 (86 y.o. [name], Meta.Reding Primary Care Saniya Tranchina: Leitha Bleak Other Clinician: Referring Victora Irby: Treating Lexington Krotz/Extender: Carola Frost Weeks in Treatment: 3 Vital Signs Time Taken: 15:21 Temperature (F): 98.2 Height (in): 64 Pulse (bpm): 74 Weight (lbs): 110 Respiratory Rate (breaths/min): 20 Body Mass Index (BMI): 18.9 Blood Pressure (mmHg): 126/70 Reference Range: 80 - 120 mg / dl Electronic Signature(s) Signed: 05/03/2021 9:46:23 AM By: Sandre Kitty Entered By: Sandre Kitty on 05/02/2021 15:21:31

## 2021-05-08 DIAGNOSIS — L98492 Non-pressure chronic ulcer of skin of other sites with fat layer exposed: Secondary | ICD-10-CM | POA: Diagnosis not present

## 2021-05-08 DIAGNOSIS — L929 Granulomatous disorder of the skin and subcutaneous tissue, unspecified: Secondary | ICD-10-CM | POA: Diagnosis not present

## 2021-05-11 DIAGNOSIS — L98492 Non-pressure chronic ulcer of skin of other sites with fat layer exposed: Secondary | ICD-10-CM | POA: Diagnosis not present

## 2021-05-11 DIAGNOSIS — Z4800 Encounter for change or removal of nonsurgical wound dressing: Secondary | ICD-10-CM | POA: Diagnosis not present

## 2021-05-15 ENCOUNTER — Inpatient Hospital Stay: Payer: Medicare Other | Attending: Internal Medicine | Admitting: Internal Medicine

## 2021-05-15 ENCOUNTER — Other Ambulatory Visit: Payer: Self-pay

## 2021-05-15 ENCOUNTER — Inpatient Hospital Stay: Payer: Medicare Other

## 2021-05-15 VITALS — BP 151/80 | HR 97 | Temp 97.8°F | Resp 20 | Ht 64.0 in | Wt 104.3 lb

## 2021-05-15 DIAGNOSIS — C3491 Malignant neoplasm of unspecified part of right bronchus or lung: Secondary | ICD-10-CM

## 2021-05-15 DIAGNOSIS — D649 Anemia, unspecified: Secondary | ICD-10-CM | POA: Diagnosis not present

## 2021-05-15 DIAGNOSIS — R21 Rash and other nonspecific skin eruption: Secondary | ICD-10-CM | POA: Diagnosis not present

## 2021-05-15 DIAGNOSIS — Z87442 Personal history of urinary calculi: Secondary | ICD-10-CM | POA: Insufficient documentation

## 2021-05-15 DIAGNOSIS — Z79899 Other long term (current) drug therapy: Secondary | ICD-10-CM | POA: Diagnosis not present

## 2021-05-15 DIAGNOSIS — I10 Essential (primary) hypertension: Secondary | ICD-10-CM | POA: Diagnosis not present

## 2021-05-15 DIAGNOSIS — Z5181 Encounter for therapeutic drug level monitoring: Secondary | ICD-10-CM | POA: Diagnosis not present

## 2021-05-15 DIAGNOSIS — Z9221 Personal history of antineoplastic chemotherapy: Secondary | ICD-10-CM | POA: Insufficient documentation

## 2021-05-15 DIAGNOSIS — R0602 Shortness of breath: Secondary | ICD-10-CM | POA: Insufficient documentation

## 2021-05-15 DIAGNOSIS — M810 Age-related osteoporosis without current pathological fracture: Secondary | ICD-10-CM | POA: Insufficient documentation

## 2021-05-15 DIAGNOSIS — Z923 Personal history of irradiation: Secondary | ICD-10-CM | POA: Diagnosis not present

## 2021-05-15 DIAGNOSIS — K219 Gastro-esophageal reflux disease without esophagitis: Secondary | ICD-10-CM | POA: Insufficient documentation

## 2021-05-15 DIAGNOSIS — N289 Disorder of kidney and ureter, unspecified: Secondary | ICD-10-CM | POA: Diagnosis not present

## 2021-05-15 DIAGNOSIS — M25431 Effusion, right wrist: Secondary | ICD-10-CM | POA: Diagnosis not present

## 2021-05-15 DIAGNOSIS — C342 Malignant neoplasm of middle lobe, bronchus or lung: Secondary | ICD-10-CM | POA: Diagnosis not present

## 2021-05-15 DIAGNOSIS — J449 Chronic obstructive pulmonary disease, unspecified: Secondary | ICD-10-CM | POA: Insufficient documentation

## 2021-05-15 LAB — CBC WITH DIFFERENTIAL (CANCER CENTER ONLY)
Abs Immature Granulocytes: 0.03 10*3/uL (ref 0.00–0.07)
Basophils Absolute: 0 10*3/uL (ref 0.0–0.1)
Basophils Relative: 1 %
Eosinophils Absolute: 0.4 10*3/uL (ref 0.0–0.5)
Eosinophils Relative: 6 %
HCT: 33.1 % — ABNORMAL LOW (ref 36.0–46.0)
Hemoglobin: 11.2 g/dL — ABNORMAL LOW (ref 12.0–15.0)
Immature Granulocytes: 1 %
Lymphocytes Relative: 11 %
Lymphs Abs: 0.7 10*3/uL (ref 0.7–4.0)
MCH: 31.7 pg (ref 26.0–34.0)
MCHC: 33.8 g/dL (ref 30.0–36.0)
MCV: 93.8 fL (ref 80.0–100.0)
Monocytes Absolute: 0.7 10*3/uL (ref 0.1–1.0)
Monocytes Relative: 11 %
Neutro Abs: 4.1 10*3/uL (ref 1.7–7.7)
Neutrophils Relative %: 70 %
Platelet Count: 344 10*3/uL (ref 150–400)
RBC: 3.53 MIL/uL — ABNORMAL LOW (ref 3.87–5.11)
RDW: 14.8 % (ref 11.5–15.5)
WBC Count: 5.8 10*3/uL (ref 4.0–10.5)
nRBC: 0 % (ref 0.0–0.2)

## 2021-05-15 LAB — CMP (CANCER CENTER ONLY)
ALT: 24 U/L (ref 0–44)
AST: 28 U/L (ref 15–41)
Albumin: 3.1 g/dL — ABNORMAL LOW (ref 3.5–5.0)
Alkaline Phosphatase: 49 U/L (ref 38–126)
Anion gap: 10 (ref 5–15)
BUN: 33 mg/dL — ABNORMAL HIGH (ref 8–23)
CO2: 29 mmol/L (ref 22–32)
Calcium: 9.5 mg/dL (ref 8.9–10.3)
Chloride: 107 mmol/L (ref 98–111)
Creatinine: 1.32 mg/dL — ABNORMAL HIGH (ref 0.44–1.00)
GFR, Estimated: 40 mL/min — ABNORMAL LOW (ref >60)
Glucose, Bld: 89 mg/dL (ref 70–99)
Potassium: 4.1 mmol/L (ref 3.5–5.1)
Sodium: 146 mmol/L — ABNORMAL HIGH (ref 135–145)
Total Bilirubin: 0.9 mg/dL (ref 0.3–1.2)
Total Protein: 5.7 g/dL — ABNORMAL LOW (ref 6.5–8.1)

## 2021-05-15 NOTE — Progress Notes (Signed)
[place] [phone]   [phone]  OFFICE PROGRESS NOTE  [name], MD [place] [address]  DIAGNOSIS: Stage IIIB (T2a, N3, M0) non-small cell lung cancer, adenocarcinoma with positive EGFR mutation diagnosed in January 2010.  PRIOR THERAPY: 1) Status post concurrent chemoradiation with weekly carboplatin and paclitaxel; last dose given March 21, 2009.  2) Tarceva at 150 mg p.o. daily, status post approximately 48 months of treatment, discontinued secondary to persistent diarrhea.  3) status post right Pleurx catheter placement for right nonmalignant pleural effusion.  CURRENT THERAPY: Tarceva 100 mg by mouth daily started 03/19/2013, status post 98 months of treatment.  INTERVAL HISTORY: Dana [name] 86 y.o. female returns to the clinic today for follow-up visit.  The patient is feeling fine today with no concerning complaints except for the rash and swelling of the right wrist.  She was seen at [facility] but no intervention.  She denied having any current chest pain, shortness of breath, cough or hemoptysis.  She denied having any fever or chills.  She has no nausea, vomiting, diarrhea or constipation.  She has no headache or visual changes.  She continues to tolerate her treatment with Tarceva fairly well.  She is here today for evaluation and repeat blood work.  MEDICAL HISTORY: Past Medical History:  Diagnosis Date  . Arthritis    HANDS,  WRISTS  . COPD (chronic obstructive pulmonary disease) ([place])   . Depression 04/04/2017  . Dyspnea on exertion   . Encounter for therapeutic drug monitoring 10/01/2016  . GERD (gastroesophageal reflux disease)    at times  . Hemorrhoid   . History of anal fissures   . History of kidney stones   . History of lung cancer ONCOLOGIST--  DR [facility]--  LAST CT ,  NO RECURRENCE OR METS   DX JAN 2010 --  STAGE IIIA  NON-SMALL CELL ADENOCARCINOMA (RIGHT MIDDLE LOBE)---   S/P  CHEMORADIATIO THERAPY  (COMPLETE 03-21-2009)  . HTN (hypertension) 10/10/2017   white coat syndrome  . Imbalance 06/04/2017  . lung ca dx'd 2010  . Normal cardiac stress test    2007  PER PT  . Osteoporosis   . Rash, skin    RIGHT FOREARM/ HAND  . Right wrist pain    MASS  . Wears glasses     ALLERGIES:  is allergic to clindamycin hcl, amoxicillin, and protonix [pantoprazole sodium].  MEDICATIONS:  Current Outpatient Medications  Medication Sig Dispense Refill  . antiseptic oral rinse (BIOTENE) LIQD 15 mLs by Mouth Rinse route as needed for dry mouth.    Marland Kitchen b complex vitamins capsule Take 1 capsule by mouth daily.    Marland Kitchen bismuth subsalicylate (PEPTO BISMOL) 262 MG/15ML suspension Take 30 mLs by mouth every three (3) days as needed.    . Calcium-Vitamin D ([employer] CALCIUM-600/VIT D PO) Take 2 tablets by mouth daily.    . cholecalciferol (VITAMIN D) 1000 UNITS tablet Take 2,000 Units by mouth daily.    . diclofenac (VOLTAREN) 75 MG EC tablet Take 75 mg by mouth 2 (two) times daily.    . diclofenac Sodium (VOLTAREN) 1 % GEL Apply 2 g topically 4 (four) times daily as needed (pain).    Marland Kitchen doxycycline (ADOXA) 100 MG tablet Take 100 mg by mouth 2 (two) times daily.    Marland Kitchen erlotinib (TARCEVA) 100 MG tablet Take 1 tablet (100 mg total) by mouth daily. 30 tablet 3  .  lactase (LACTAID) 3000 units tablet Take 3,000 Units by mouth daily.    Marland Kitchen loratadine (CLARITIN) 10 MG tablet Take 10 mg by mouth daily as needed for allergies.    . meloxicam (MOBIC) 15 MG tablet Take 15 mg by mouth daily as needed for pain.  4  . mirtazapine (REMERON) 15 MG tablet TAKE 1 TABLET BY MOUTH AT BEDTIME 30 tablet 0  . Misc Natural Products (OSTEO BI-FLEX JOINT SHIELD PO) Take 2 tablets by mouth daily.    Marland Kitchen oxycodone-acetaminophen (LYNOX) 10-300 MG tablet Take 1 tablet by mouth every 4 (four) hours as needed for pain.    Vladimir Faster Glycol-Propyl Glycol (SYSTANE OP) Place 1 drop into both eyes at bedtime as needed (for dry  eyes.).    Marland Kitchen Simethicone (GAS-X PO) Take 1 tablet by mouth 2 (two) times daily as needed (for gas).     . Tiotropium Bromide-Olodaterol (STIOLTO RESPIMAT) 2.5-2.5 MCG/ACT AERS Inhale 2 puffs into the lungs daily. 4 g 11   No current facility-administered medications for this visit.    SURGICAL HISTORY:  Past Surgical History:  Procedure Laterality Date  . ANAL FISSURE REPAIR  07/09/2011   INTERNAL SPHINCTEROTOMY  . BENIGN EXCISION LEFT BREAST CENTRAL DUCT  02/1999  . BREAST BIOPSY  01/31/2009   benign  . BREAST EXCISIONAL BIOPSY Left 2004   benign  . CHEST TUBE INSERTION Right 07/14/2014   Procedure: INSERTION PLEURAL DRAINAGE CATHETER RIGHT CHEST;  Surgeon: Grace Isaac, MD;  Location: [facility];  Service: Thoracic;  Laterality: Right;  . EXCISION RIGHT WRIST MASS  2012  . EXTRACORPOREAL SHOCK WAVE LITHOTRIPSY Left 06/01/2019   Procedure: EXTRACORPOREAL SHOCK WAVE LITHOTRIPSY (ESWL);  Surgeon: Cleon Gustin, MD;  Location: [facility];  Service: Urology;  Laterality: Left;  . KNEE ARTHROSCOPY Right 1999  . MASS EXCISION Right 03/11/2014   Procedure: RIGHT WRIST DEEP MASS EXCISION WITH CULTURE AND BIOSPY;  Surgeon: Linna Hoff, MD;  Location: [facility];  Service: Orthopedics;  Laterality: Right;  . MICROLARYNGOSCOPY W/VOCAL CORD INJECTION Bilateral 02/08/2021   Procedure: MICROLARYNGOSCOPY WITH VOCAL CORD INJECTION;  Surgeon: Melida Quitter, MD;  Location: [facility];  Service: ENT;  Laterality: Bilateral;  . REMOVAL OF PLEURAL DRAINAGE CATHETER Right 10/14/2014   Procedure: REMOVAL OF PLEURAL DRAINAGE CATHETER;  Surgeon: Grace Isaac, MD;  Location: [facility];  Service: Thoracic;  Laterality: Right;  . TALC PLEURODESIS Right 09/16/2014   Procedure: TALC PLEURADESIS/ slurry;  Surgeon: Grace Isaac, MD;  Location: [facility];  Service: Thoracic;  Laterality: Right;  . THORACOSCOPY  01-26-2009   w/   lung/  node biopsy's  . THUMB ARTHROSCOPY Left    - removed bone spur  .  TONSILLECTOMY  AS CHILD  . TOTAL ABDOMINAL HYSTERECTOMY W/ BILATERAL SALPINGOOPHORECTOMY  1982   W/  APPENDECTOMY  . TRANSTHORACIC ECHOCARDIOGRAM  12-23-2008   NORMAL LV/  EF 65-70%/  MILD MR  &  TR  . VAULT SUSPENSION PLUS CYSTOCELE REPAIR WITH GRAFT  06-13-2010    REVIEW OF SYSTEMS:  A comprehensive review of systems was negative except for: Constitutional: positive for fatigue Integument/breast: positive for rash   PHYSICAL EXAMINATION: General appearance: alert, cooperative, fatigued and no distress Head: Normocephalic, without obvious abnormality, atraumatic Neck: no adenopathy, no JVD, supple, symmetrical, trachea midline and thyroid not enlarged, symmetric, no tenderness/mass/nodules Lymph nodes: Cervical, supraclavicular, and axillary nodes normal. Resp: clear to auscultation bilaterally Back: symmetric, no curvature. ROM normal. No CVA tenderness. Cardio: regular  rate and rhythm, S1, S2 normal, no murmur, click, rub or gallop GI: soft, non-tender; bowel sounds normal; no masses,  no organomegaly Extremities: extremities normal, atraumatic, no cyanosis or edema  ECOG PERFORMANCE STATUS: 1 - Symptomatic but completely ambulatory  Blood pressure (!) 151/80, pulse 97, temperature 97.8 F (36.6 C), temperature source Tympanic, resp. rate 20, height $RemoveBe'5\' 4"'ciSHIlQLt$  (1.626 m), weight 104 lb 4.8 oz (47.3 kg), SpO2 100 %.  LABORATORY DATA: Lab Results  Component Value Date   WBC 5.8 05/15/2021   HGB 11.2 (L) 05/15/2021   HCT 33.1 (L) 05/15/2021   MCV 93.8 05/15/2021   PLT 344 05/15/2021      Chemistry      Component Value Date/Time   NA 146 (H) 05/15/2021 1019   NA 142 12/10/2017 1000   K 4.1 05/15/2021 1019   K 4.0 12/10/2017 1000   CL 107 05/15/2021 1019   CL 108 (H) 06/03/2013 0939   CO2 29 05/15/2021 1019   CO2 28 12/10/2017 1000   BUN 33 (H) 05/15/2021 1019   BUN 20.4 12/10/2017 1000   CREATININE 1.32 (H) 05/15/2021 1019   CREATININE 0.9 12/10/2017 1000      Component  Value Date/Time   CALCIUM 9.5 05/15/2021 1019   CALCIUM 9.7 12/10/2017 1000   ALKPHOS 49 05/15/2021 1019   ALKPHOS 63 12/10/2017 1000   AST 28 05/15/2021 1019   AST 25 12/10/2017 1000   ALT 24 05/15/2021 1019   ALT 17 12/10/2017 1000   BILITOT 0.9 05/15/2021 1019   BILITOT 1.05 12/10/2017 1000       RADIOGRAPHIC STUDIES: No results found.  ASSESSMENT AND PLAN:  This is a very pleasant 86 years old white female with stage IIIB non-small cell lung cancer, adenocarcinoma with positive EGFR mutation diagnosed in January 2010. She has been on treatment with Tarceva for more than 9 years.   She is currently on Tarceva 100 mg p.o. daily status post 98  months. The patient continues to tolerate this treatment well with no concerning adverse effects.  Her blood work today is unremarkable except for mild anemia and renal insufficiency. I recommended her to continue her current treatment with Tarceva with the same dose for now. I will see the patient back for follow-up visit in 2 months for evaluation and repeat blood work. For the hypertension she will continue with her current blood pressure medication and monitor it closely at home. The patient was advised to call immediately if she has any concerning symptoms in the interval. The patient voices understanding of current disease status and treatment options and is in agreement with the current care plan. All questions were answered. The patient knows to call the clinic with any problems, questions or concerns. We can certainly see the patient much sooner if necessary. Disclaimer: This note was dictated with voice recognition software. Similar sounding words can inadvertently be transcribed and may not be corrected upon review.

## 2021-05-16 DIAGNOSIS — Z4800 Encounter for change or removal of nonsurgical wound dressing: Secondary | ICD-10-CM | POA: Diagnosis not present

## 2021-05-16 DIAGNOSIS — L98492 Non-pressure chronic ulcer of skin of other sites with fat layer exposed: Secondary | ICD-10-CM | POA: Diagnosis not present

## 2021-05-23 DIAGNOSIS — Z4800 Encounter for change or removal of nonsurgical wound dressing: Secondary | ICD-10-CM | POA: Diagnosis not present

## 2021-05-23 DIAGNOSIS — L98492 Non-pressure chronic ulcer of skin of other sites with fat layer exposed: Secondary | ICD-10-CM | POA: Diagnosis not present

## 2021-05-30 DIAGNOSIS — Z4800 Encounter for change or removal of nonsurgical wound dressing: Secondary | ICD-10-CM | POA: Diagnosis not present

## 2021-06-01 DIAGNOSIS — M810 Age-related osteoporosis without current pathological fracture: Secondary | ICD-10-CM | POA: Diagnosis not present

## 2021-06-06 DIAGNOSIS — L309 Dermatitis, unspecified: Secondary | ICD-10-CM | POA: Diagnosis not present

## 2021-06-06 DIAGNOSIS — L98492 Non-pressure chronic ulcer of skin of other sites with fat layer exposed: Secondary | ICD-10-CM | POA: Diagnosis not present

## 2021-06-06 DIAGNOSIS — L929 Granulomatous disorder of the skin and subcutaneous tissue, unspecified: Secondary | ICD-10-CM | POA: Diagnosis not present

## 2021-06-15 DIAGNOSIS — K219 Gastro-esophageal reflux disease without esophagitis: Secondary | ICD-10-CM | POA: Diagnosis not present

## 2021-06-15 DIAGNOSIS — M810 Age-related osteoporosis without current pathological fracture: Secondary | ICD-10-CM | POA: Diagnosis not present

## 2021-06-15 DIAGNOSIS — C3490 Malignant neoplasm of unspecified part of unspecified bronchus or lung: Secondary | ICD-10-CM | POA: Diagnosis not present

## 2021-06-15 DIAGNOSIS — E782 Mixed hyperlipidemia: Secondary | ICD-10-CM | POA: Diagnosis not present

## 2021-06-15 DIAGNOSIS — I2510 Atherosclerotic heart disease of native coronary artery without angina pectoris: Secondary | ICD-10-CM | POA: Diagnosis not present

## 2021-06-15 DIAGNOSIS — M05431 Rheumatoid myopathy with rheumatoid arthritis of right wrist: Secondary | ICD-10-CM | POA: Diagnosis not present

## 2021-06-15 DIAGNOSIS — J449 Chronic obstructive pulmonary disease, unspecified: Secondary | ICD-10-CM | POA: Diagnosis not present

## 2021-06-23 ENCOUNTER — Other Ambulatory Visit: Payer: Self-pay | Admitting: Internal Medicine

## 2021-06-23 DIAGNOSIS — C3491 Malignant neoplasm of unspecified part of right bronchus or lung: Secondary | ICD-10-CM

## 2021-07-03 ENCOUNTER — Other Ambulatory Visit: Payer: Self-pay | Admitting: Family Medicine

## 2021-07-03 DIAGNOSIS — Z1231 Encounter for screening mammogram for malignant neoplasm of breast: Secondary | ICD-10-CM

## 2021-07-11 ENCOUNTER — Other Ambulatory Visit: Payer: Self-pay | Admitting: Family Medicine

## 2021-07-11 DIAGNOSIS — M810 Age-related osteoporosis without current pathological fracture: Secondary | ICD-10-CM

## 2021-07-12 ENCOUNTER — Inpatient Hospital Stay: Payer: Medicare Other | Attending: Internal Medicine

## 2021-07-12 ENCOUNTER — Other Ambulatory Visit: Payer: Self-pay

## 2021-07-12 ENCOUNTER — Inpatient Hospital Stay: Payer: Medicare Other | Admitting: Internal Medicine

## 2021-07-12 VITALS — BP 144/83 | HR 97 | Temp 98.5°F | Resp 20 | Ht 64.0 in | Wt 107.0 lb

## 2021-07-12 DIAGNOSIS — Z87442 Personal history of urinary calculi: Secondary | ICD-10-CM | POA: Insufficient documentation

## 2021-07-12 DIAGNOSIS — R0602 Shortness of breath: Secondary | ICD-10-CM | POA: Diagnosis not present

## 2021-07-12 DIAGNOSIS — C342 Malignant neoplasm of middle lobe, bronchus or lung: Secondary | ICD-10-CM | POA: Diagnosis not present

## 2021-07-12 DIAGNOSIS — K219 Gastro-esophageal reflux disease without esophagitis: Secondary | ICD-10-CM | POA: Insufficient documentation

## 2021-07-12 DIAGNOSIS — J449 Chronic obstructive pulmonary disease, unspecified: Secondary | ICD-10-CM | POA: Insufficient documentation

## 2021-07-12 DIAGNOSIS — D509 Iron deficiency anemia, unspecified: Secondary | ICD-10-CM | POA: Diagnosis not present

## 2021-07-12 DIAGNOSIS — Z79899 Other long term (current) drug therapy: Secondary | ICD-10-CM | POA: Insufficient documentation

## 2021-07-12 DIAGNOSIS — M810 Age-related osteoporosis without current pathological fracture: Secondary | ICD-10-CM | POA: Diagnosis not present

## 2021-07-12 DIAGNOSIS — I10 Essential (primary) hypertension: Secondary | ICD-10-CM | POA: Insufficient documentation

## 2021-07-12 DIAGNOSIS — C3490 Malignant neoplasm of unspecified part of unspecified bronchus or lung: Secondary | ICD-10-CM

## 2021-07-12 DIAGNOSIS — R21 Rash and other nonspecific skin eruption: Secondary | ICD-10-CM | POA: Insufficient documentation

## 2021-07-12 DIAGNOSIS — C3491 Malignant neoplasm of unspecified part of right bronchus or lung: Secondary | ICD-10-CM

## 2021-07-12 LAB — CBC WITH DIFFERENTIAL (CANCER CENTER ONLY)
Abs Immature Granulocytes: 0.01 10*3/uL (ref 0.00–0.07)
Basophils Absolute: 0 10*3/uL (ref 0.0–0.1)
Basophils Relative: 0 %
Eosinophils Absolute: 0.3 10*3/uL (ref 0.0–0.5)
Eosinophils Relative: 4 %
HCT: 30.8 % — ABNORMAL LOW (ref 36.0–46.0)
Hemoglobin: 10.5 g/dL — ABNORMAL LOW (ref 12.0–15.0)
Immature Granulocytes: 0 %
Lymphocytes Relative: 9 %
Lymphs Abs: 0.7 10*3/uL (ref 0.7–4.0)
MCH: 31.8 pg (ref 26.0–34.0)
MCHC: 34.1 g/dL (ref 30.0–36.0)
MCV: 93.3 fL (ref 80.0–100.0)
Monocytes Absolute: 0.8 10*3/uL (ref 0.1–1.0)
Monocytes Relative: 11 %
Neutro Abs: 5.5 10*3/uL (ref 1.7–7.7)
Neutrophils Relative %: 76 %
Platelet Count: 316 10*3/uL (ref 150–400)
RBC: 3.3 MIL/uL — ABNORMAL LOW (ref 3.87–5.11)
RDW: 14.1 % (ref 11.5–15.5)
WBC Count: 7.3 10*3/uL (ref 4.0–10.5)
nRBC: 0 % (ref 0.0–0.2)

## 2021-07-12 LAB — CMP (CANCER CENTER ONLY)
ALT: 38 U/L (ref 0–44)
AST: 35 U/L (ref 15–41)
Albumin: 3.2 g/dL — ABNORMAL LOW (ref 3.5–5.0)
Alkaline Phosphatase: 39 U/L (ref 38–126)
Anion gap: 7 (ref 5–15)
BUN: 35 mg/dL — ABNORMAL HIGH (ref 8–23)
CO2: 29 mmol/L (ref 22–32)
Calcium: 9.1 mg/dL (ref 8.9–10.3)
Chloride: 105 mmol/L (ref 98–111)
Creatinine: 1.1 mg/dL — ABNORMAL HIGH (ref 0.44–1.00)
GFR, Estimated: 50 mL/min — ABNORMAL LOW (ref >60)
Glucose, Bld: 100 mg/dL — ABNORMAL HIGH (ref 70–99)
Potassium: 4.1 mmol/L (ref 3.5–5.1)
Sodium: 141 mmol/L (ref 135–145)
Total Bilirubin: 0.9 mg/dL (ref 0.3–1.2)
Total Protein: 5.8 g/dL — ABNORMAL LOW (ref 6.5–8.1)

## 2021-07-12 NOTE — Progress Notes (Signed)
[place] [phone]   [phone]  OFFICE PROGRESS NOTE  [name], MD [place] [address]  DIAGNOSIS: Stage IIIB (T2a, N3, M0) non-small cell lung cancer, adenocarcinoma with positive EGFR mutation diagnosed in January 2010.  PRIOR THERAPY: 1) Status post concurrent chemoradiation with weekly carboplatin and paclitaxel; last dose given March 21, 2009.  2) Tarceva at 150 mg p.o. daily, status post approximately 48 months of treatment, discontinued secondary to persistent diarrhea.  3) status post right Pleurx catheter placement for right nonmalignant pleural effusion.  CURRENT THERAPY: Tarceva 100 mg by mouth daily started 03/19/2013, status post 100 months of treatment.  INTERVAL HISTORY: Dana [name] 86 y.o. female returns to the clinic today for 85-month follow-up visit.  The patient has no complaints today except for the fatigue and shortness of breath with exertion.  She continues to have the rash on the right hand and she is seen by dermatology at [facility].  She denied having any current chest pain, cough or hemoptysis.  She denied having any fever or chills.  She has no nausea, vomiting, diarrhea or constipation.  She has no headache or visual changes.  She is here today for evaluation and repeat blood work.  MEDICAL HISTORY: Past Medical History:  Diagnosis Date   Arthritis    HANDS,  WRISTS   COPD (chronic obstructive pulmonary disease) ([place])    Depression 04/04/2017   Dyspnea on exertion    Encounter for therapeutic drug monitoring 10/01/2016   GERD (gastroesophageal reflux disease)    at times   Hemorrhoid    History of anal fissures    History of kidney stones    History of lung cancer ONCOLOGIST--  DR [name]--  LAST CT ,  NO RECURRENCE OR METS   DX JAN 2010 --  STAGE IIIA  NON-SMALL CELL ADENOCARCINOMA (RIGHT MIDDLE LOBE)---  S/P  CHEMORADIATIO THERAPY  (COMPLETE 03-21-2009)   HTN  (hypertension) 10/10/2017   white coat syndrome   Imbalance 06/04/2017   lung ca dx'd 2010   Normal cardiac stress test    2007  PER PT   Osteoporosis    Rash, skin    RIGHT FOREARM/ HAND   Right wrist pain    MASS   Wears glasses     ALLERGIES:  is allergic to methotrexate, clindamycin hcl, amoxicillin, and protonix [pantoprazole sodium].  MEDICATIONS:  Current Outpatient Medications  Medication Sig Dispense Refill   antiseptic oral rinse (BIOTENE) LIQD 15 mLs by Mouth Rinse route as needed for dry mouth.     b complex vitamins capsule Take 1 capsule by mouth daily.     bismuth subsalicylate (PEPTO BISMOL) 262 MG/15ML suspension Take 30 mLs by mouth every three (3) days as needed.     Calcium-Vitamin D ([employer] CALCIUM-600/VIT D PO) Take 2 tablets by mouth daily.     cholecalciferol (VITAMIN D) 1000 UNITS tablet Take 2,000 Units by mouth daily.     diclofenac (VOLTAREN) 75 MG EC tablet Take 75 mg by mouth 2 (two) times daily.     diclofenac Sodium (VOLTAREN) 1 % GEL Apply 2 g topically 4 (four) times daily as needed (pain).     doxycycline (ADOXA) 100 MG tablet Take 100 mg by mouth 2 (two) times daily.     erlotinib (TARCEVA) 100 MG tablet Take 1 tablet (100 mg total) by mouth daily. 30 tablet 3   lactase (LACTAID) 3000 units tablet Take  3,000 Units by mouth daily.     loratadine (CLARITIN) 10 MG tablet Take 10 mg by mouth daily as needed for allergies.     meloxicam (MOBIC) 15 MG tablet Take 15 mg by mouth daily as needed for pain.  4   mirtazapine (REMERON) 15 MG tablet TAKE 1 TABLET BY MOUTH AT BEDTIME 30 tablet 0   Misc Natural Products (OSTEO BI-FLEX JOINT SHIELD PO) Take 2 tablets by mouth daily.     oxycodone-acetaminophen (LYNOX) 10-300 MG tablet Take 1 tablet by mouth every 4 (four) hours as needed for pain.     Polyethyl Glycol-Propyl Glycol (SYSTANE OP) Place 1 drop into both eyes at bedtime as needed (for dry eyes.).     Simethicone (GAS-X PO) Take 1 tablet by mouth 2 (two)  times daily as needed (for gas).      Tiotropium Bromide-Olodaterol (STIOLTO RESPIMAT) 2.5-2.5 MCG/ACT AERS Inhale 2 puffs into the lungs daily. 4 g 11   No current facility-administered medications for this visit.    SURGICAL HISTORY:  Past Surgical History:  Procedure Laterality Date   ANAL FISSURE REPAIR  07/09/2011   INTERNAL SPHINCTEROTOMY   BENIGN EXCISION LEFT BREAST CENTRAL DUCT  02/1999   BREAST BIOPSY  01/31/2009   benign   BREAST EXCISIONAL BIOPSY Left 2004   benign   CHEST TUBE INSERTION Right 07/14/2014   Procedure: INSERTION PLEURAL DRAINAGE CATHETER RIGHT CHEST;  Surgeon: Grace Isaac, MD;  Location: [facility];  Service: Thoracic;  Laterality: Right;   EXCISION RIGHT WRIST MASS  2012   EXTRACORPOREAL SHOCK WAVE LITHOTRIPSY Left 06/01/2019   Procedure: EXTRACORPOREAL SHOCK WAVE LITHOTRIPSY (ESWL);  Surgeon: Cleon Gustin, MD;  Location: [facility];  Service: Urology;  Laterality: Left;   KNEE ARTHROSCOPY Right 1999   MASS EXCISION Right 03/11/2014   Procedure: RIGHT WRIST DEEP MASS EXCISION WITH CULTURE AND BIOSPY;  Surgeon: Linna Hoff, MD;  Location: [facility];  Service: Orthopedics;  Laterality: Right;   MICROLARYNGOSCOPY W/VOCAL CORD INJECTION Bilateral 02/08/2021   Procedure: MICROLARYNGOSCOPY WITH VOCAL CORD INJECTION;  Surgeon: Melida Quitter, MD;  Location: [facility];  Service: ENT;  Laterality: Bilateral;   REMOVAL OF PLEURAL DRAINAGE CATHETER Right 10/14/2014   Procedure: REMOVAL OF PLEURAL DRAINAGE CATHETER;  Surgeon: Grace Isaac, MD;  Location: [facility];  Service: Thoracic;  Laterality: Right;   TALC PLEURODESIS Right 09/16/2014   Procedure: TALC PLEURADESIS/ slurry;  Surgeon: Grace Isaac, MD;  Location: [facility];  Service: Thoracic;  Laterality: Right;   THORACOSCOPY  01-26-2009   w/   lung/  node biopsy's   THUMB ARTHROSCOPY Left    - removed bone spur   TONSILLECTOMY  AS CHILD   TOTAL ABDOMINAL HYSTERECTOMY W/ BILATERAL  SALPINGOOPHORECTOMY  1982   W/  APPENDECTOMY   TRANSTHORACIC ECHOCARDIOGRAM  12-23-2008   NORMAL LV/  EF 65-70%/  MILD MR  &  TR   VAULT SUSPENSION PLUS CYSTOCELE REPAIR WITH GRAFT  06-13-2010    REVIEW OF SYSTEMS:  A comprehensive review of systems was negative except for: Constitutional: positive for fatigue Respiratory: positive for dyspnea on exertion Integument/breast: positive for rash   PHYSICAL EXAMINATION: General appearance: alert, cooperative, fatigued, and no distress Head: Normocephalic, without obvious abnormality, atraumatic Neck: no adenopathy, no JVD, supple, symmetrical, trachea midline, and thyroid not enlarged, symmetric, no tenderness/mass/nodules Lymph nodes: Cervical, supraclavicular, and axillary nodes normal. Resp: clear to auscultation bilaterally Back: symmetric, no curvature. ROM normal. No CVA tenderness. Cardio: regular  rate and rhythm, S1, S2 normal, no murmur, click, rub or gallop GI: soft, non-tender; bowel sounds normal; no masses,  no organomegaly Extremities: extremities normal, atraumatic, no cyanosis or edema  ECOG PERFORMANCE STATUS: 1 - Symptomatic but completely ambulatory  Blood pressure (!) 144/83, pulse 97, temperature 98.5 F (36.9 C), temperature source Tympanic, resp. rate 20, height $RemoveBe'5\' 4"'OIlwqFcgU$  (1.626 m), weight 107 lb (48.5 kg), SpO2 98 %.  LABORATORY DATA: Lab Results  Component Value Date   WBC 7.3 07/12/2021   HGB 10.5 (L) 07/12/2021   HCT 30.8 (L) 07/12/2021   MCV 93.3 07/12/2021   PLT 316 07/12/2021      Chemistry      Component Value Date/Time   NA 146 (H) 05/15/2021 1019   NA 142 12/10/2017 1000   K 4.1 05/15/2021 1019   K 4.0 12/10/2017 1000   CL 107 05/15/2021 1019   CL 108 (H) 06/03/2013 0939   CO2 29 05/15/2021 1019   CO2 28 12/10/2017 1000   BUN 33 (H) 05/15/2021 1019   BUN 20.4 12/10/2017 1000   CREATININE 1.32 (H) 05/15/2021 1019   CREATININE 0.9 12/10/2017 1000      Component Value Date/Time   CALCIUM 9.5  05/15/2021 1019   CALCIUM 9.7 12/10/2017 1000   ALKPHOS 49 05/15/2021 1019   ALKPHOS 63 12/10/2017 1000   AST 28 05/15/2021 1019   AST 25 12/10/2017 1000   ALT 24 05/15/2021 1019   ALT 17 12/10/2017 1000   BILITOT 0.9 05/15/2021 1019   BILITOT 1.05 12/10/2017 1000       RADIOGRAPHIC STUDIES: No results found.  ASSESSMENT AND PLAN:  This is a very pleasant 86 years old white female with stage IIIB non-small cell lung cancer, adenocarcinoma with positive EGFR mutation diagnosed in January 2010. She has been on treatment with Tarceva for more than 9 years.   She is currently on Tarceva 100 mg p.o. daily status post 100  months. The patient continues to tolerate her treatment with Tarceva fairly well. I recommended for her to continue her current treatment with Tarceva with the same dose. I will see her back for follow-up visit in 2 months for evaluation with repeat CT scan of the chest for restaging of her disease. For the anemia she was advised to take over-the-counter oral iron tablet few tablets weekly. The patient was advised to call immediately if she has any concerning symptoms in the interval. The patient voices understanding of current disease status and treatment options and is in agreement with the current care plan. All questions were answered. The patient knows to call the clinic with any problems, questions or concerns. We can certainly see the patient much sooner if necessary. Disclaimer: This note was dictated with voice recognition software. Similar sounding words can inadvertently be transcribed and may not be corrected upon review.

## 2021-07-13 DIAGNOSIS — L98492 Non-pressure chronic ulcer of skin of other sites with fat layer exposed: Secondary | ICD-10-CM | POA: Diagnosis not present

## 2021-08-22 ENCOUNTER — Other Ambulatory Visit: Payer: Self-pay | Admitting: Internal Medicine

## 2021-08-22 ENCOUNTER — Telehealth: Payer: Self-pay

## 2021-08-22 DIAGNOSIS — C3491 Malignant neoplasm of unspecified part of right bronchus or lung: Secondary | ICD-10-CM

## 2021-08-22 NOTE — Telephone Encounter (Signed)
Fax received requesting refill of Tarceva  SEND TO [facility]

## 2021-08-23 ENCOUNTER — Other Ambulatory Visit: Payer: Self-pay | Admitting: Internal Medicine

## 2021-08-23 DIAGNOSIS — C3490 Malignant neoplasm of unspecified part of unspecified bronchus or lung: Secondary | ICD-10-CM

## 2021-08-23 MED ORDER — ERLOTINIB HCL 100 MG PO TABS: 100.0000 mg | ORAL_TABLET | Freq: Every day | ORAL | 3 refills | Status: DC

## 2021-08-24 ENCOUNTER — Ambulatory Visit: Payer: Medicare Other

## 2021-08-30 DIAGNOSIS — L308 Other specified dermatitis: Secondary | ICD-10-CM | POA: Diagnosis not present

## 2021-08-30 DIAGNOSIS — L929 Granulomatous disorder of the skin and subcutaneous tissue, unspecified: Secondary | ICD-10-CM | POA: Diagnosis not present

## 2021-08-30 DIAGNOSIS — Z4800 Encounter for change or removal of nonsurgical wound dressing: Secondary | ICD-10-CM | POA: Diagnosis not present

## 2021-08-30 DIAGNOSIS — L98492 Non-pressure chronic ulcer of skin of other sites with fat layer exposed: Secondary | ICD-10-CM | POA: Diagnosis not present

## 2021-09-05 ENCOUNTER — Ambulatory Visit: Payer: Medicare Other | Admitting: Sports Medicine

## 2021-09-05 ENCOUNTER — Other Ambulatory Visit: Payer: Self-pay

## 2021-09-05 VITALS — BP 122/70 | HR 78 | Ht 64.0 in | Wt 105.0 lb

## 2021-09-05 DIAGNOSIS — M1711 Unilateral primary osteoarthritis, right knee: Secondary | ICD-10-CM | POA: Diagnosis not present

## 2021-09-05 DIAGNOSIS — M25561 Pain in right knee: Secondary | ICD-10-CM

## 2021-09-05 NOTE — Progress Notes (Signed)
Dana [name] D.Dana [name] Phone: [phone]   Assessment and Plan:     1. Osteoarthritis of right knee, unspecified osteoarthritis type 2. Acute pain of right knee - Acute on chronic flare of osteoarthritis - Patient elects for CSI.  Tolerated well per procedure note below.  Last CSI before today was in 07/2020 - Continue HEP - Tylenol as needed for pain control  Pertinent review of records include: Previous sportsman note, previous x-ray, previous PCP note  Follow up: As needed if no improvement or worsening of symptoms.  Could consider HA injection at that time.      Subjective:   I, Dana [name], am serving as a scribe for Dana [name]  Chief Complaint: Right knee pain   HPI: 86 year old female presenting with R knee pain   09/05/21 Patient states that she has been seeing Dana [name] for right knee pain and gets steroid injections to help with the pain. Last steroid injection was 08/02/2020.  Patient states that she had no relief of pain from previous injection.  Patient usually only has pain while walking it is fine at rest. Patient will use the votaren gel to help with pain. Patient ambulates with a walker. Patient locates pain to Medial Right patella.   Relevant Historical Information: lung cancer, chronic knee pain with OA  Additional pertinent review of systems negative.   Current Outpatient Medications:    antiseptic oral rinse (BIOTENE) LIQD, 15 mLs by Mouth Rinse route as needed for dry mouth., Disp: , Rfl:    b complex vitamins capsule, Take 1 capsule by mouth daily., Disp: , Rfl:    bismuth subsalicylate (PEPTO BISMOL) 262 MG/15ML suspension, Take 30 mLs by mouth every three (3) days as needed., Disp: , Rfl:    Calcium-Vitamin D ([employer]S CALCIUM-600/VIT D PO), Take 2 tablets by mouth daily., Disp: , Rfl:    cholecalciferol (VITAMIN D) 1000 UNITS tablet, Take 2,000 Units by mouth  daily., Disp: , Rfl:    diclofenac (VOLTAREN) 75 MG EC tablet, Take 75 mg by mouth 2 (two) times daily., Disp: , Rfl:    diclofenac Sodium (VOLTAREN) 1 % GEL, Apply 2 g topically 4 (four) times daily as needed (pain)., Disp: , Rfl:    erlotinib (TARCEVA) 100 MG tablet, Take 1 tablet (100 mg total) by mouth daily., Disp: 30 tablet, Rfl: 3   lactase (LACTAID) 3000 units tablet, Take 3,000 Units by mouth daily., Disp: , Rfl:    loratadine (CLARITIN) 10 MG tablet, Take 10 mg by mouth daily as needed for allergies., Disp: , Rfl:    mirtazapine (REMERON) 15 MG tablet, TAKE 1 TABLET BY MOUTH ONCE DAILY AT BEDTIME, Disp: 30 tablet, Rfl: 0   Misc Natural Products (OSTEO BI-FLEX JOINT SHIELD PO), Take 2 tablets by mouth daily., Disp: , Rfl:    Polyethyl Glycol-Propyl Glycol (SYSTANE OP), Place 1 drop into both eyes at bedtime as needed (for dry eyes.)., Disp: , Rfl:    Simethicone (GAS-X PO), Take 1 tablet by mouth 2 (two) times daily as needed (for gas). , Disp: , Rfl:    Tiotropium Bromide-Olodaterol (STIOLTO RESPIMAT) 2.5-2.5 MCG/ACT AERS, Inhale 2 puffs into the lungs daily., Disp: 4 g, Rfl: 11   doxycycline (ADOXA) 100 MG tablet, Take 100 mg by mouth 2 (two) times daily. (Patient not taking: Reported on 09/05/2021), Disp: , Rfl:    meloxicam (MOBIC) 15 MG tablet, Take 15 mg  by mouth daily as needed for pain. (Patient not taking: Reported on 09/05/2021), Disp: , Rfl: 4   oxycodone-acetaminophen (LYNOX) 10-300 MG tablet, Take 1 tablet by mouth every 4 (four) hours as needed for pain. (Patient not taking: Reported on 09/05/2021), Disp: , Rfl:    Objective:     Vitals:   09/05/21 0944  BP: 122/70  Pulse: 78  SpO2: 98%  Weight: 105 lb (47.6 kg)  Height: 5\' 4"  (1.626 m)      Body mass index is 18.02 kg/m.    Physical Exam:    General:  awake, alert oriented, no acute distress nontoxic Skin: no suspicious lesions or rashes Neuro:sensation intact, no deficits, strength 5/5 with no deficits, no  atrophy, normal muscle tone Psych: No signs of anxiety, depression or other mood disorder  Right knee: crepitus No swelling No deformity Neg fluid wave, joint milking ROM Flex 100, Ext 10 TTP medial jt line,  NTTP over the quad tendon, medial fem condyle, lat fem condyle, patella, plica, patella tendon, tibial tuberostiy, fibular head, posterior fossa, pes anserine bursa, gerdy's tubercle, lateral jt line Neg anterior and posterior drawer Neg lachman Neg sag sign Negative varus stress Negative valgus stress Negative McMurray  Gait normal    Electronically signed by:  Dana [name] [facility] 10:10 AM 09/05/21

## 2021-09-05 NOTE — Patient Instructions (Addendum)
Good to see you  Injection in right knee given today Take it easy today Can take tylenol if needed to help with pain  Can do stationary exercises if cant tolerate walking  See me again in 2-4 weeks

## 2021-09-08 ENCOUNTER — Inpatient Hospital Stay: Payer: Medicare Other | Attending: Internal Medicine

## 2021-09-08 ENCOUNTER — Ambulatory Visit (HOSPITAL_COMMUNITY)
Admission: RE | Admit: 2021-09-08 | Discharge: 2021-09-08 | Disposition: A | Discharge status: Home / Self Care | Payer: Medicare Other | Source: Ambulatory Visit | Attending: Internal Medicine | Admitting: Internal Medicine

## 2021-09-08 ENCOUNTER — Other Ambulatory Visit: Payer: Self-pay

## 2021-09-08 DIAGNOSIS — Z79899 Other long term (current) drug therapy: Secondary | ICD-10-CM | POA: Diagnosis not present

## 2021-09-08 DIAGNOSIS — C342 Malignant neoplasm of middle lobe, bronchus or lung: Secondary | ICD-10-CM | POA: Diagnosis not present

## 2021-09-08 DIAGNOSIS — C3490 Malignant neoplasm of unspecified part of unspecified bronchus or lung: Secondary | ICD-10-CM | POA: Insufficient documentation

## 2021-09-08 DIAGNOSIS — Z85118 Personal history of other malignant neoplasm of bronchus and lung: Secondary | ICD-10-CM | POA: Insufficient documentation

## 2021-09-08 DIAGNOSIS — K219 Gastro-esophageal reflux disease without esophagitis: Secondary | ICD-10-CM | POA: Diagnosis not present

## 2021-09-08 DIAGNOSIS — J479 Bronchiectasis, uncomplicated: Secondary | ICD-10-CM | POA: Diagnosis not present

## 2021-09-08 DIAGNOSIS — J449 Chronic obstructive pulmonary disease, unspecified: Secondary | ICD-10-CM | POA: Insufficient documentation

## 2021-09-08 DIAGNOSIS — Z87442 Personal history of urinary calculi: Secondary | ICD-10-CM | POA: Diagnosis not present

## 2021-09-08 DIAGNOSIS — D649 Anemia, unspecified: Secondary | ICD-10-CM | POA: Insufficient documentation

## 2021-09-08 DIAGNOSIS — Z8719 Personal history of other diseases of the digestive system: Secondary | ICD-10-CM | POA: Insufficient documentation

## 2021-09-08 DIAGNOSIS — M810 Age-related osteoporosis without current pathological fracture: Secondary | ICD-10-CM | POA: Diagnosis not present

## 2021-09-08 DIAGNOSIS — R911 Solitary pulmonary nodule: Secondary | ICD-10-CM | POA: Diagnosis not present

## 2021-09-08 LAB — CMP (CANCER CENTER ONLY)
ALT: 32 U/L (ref 0–44)
AST: 28 U/L (ref 15–41)
Albumin: 3.7 g/dL (ref 3.5–5.0)
Alkaline Phosphatase: 45 U/L (ref 38–126)
Anion gap: 8 (ref 5–15)
BUN: 46 mg/dL — ABNORMAL HIGH (ref 8–23)
CO2: 27 mmol/L (ref 22–32)
Calcium: 9.5 mg/dL (ref 8.9–10.3)
Chloride: 107 mmol/L (ref 98–111)
Creatinine: 1.25 mg/dL — ABNORMAL HIGH (ref 0.44–1.00)
GFR, Estimated: 43 mL/min — ABNORMAL LOW (ref >60)
Glucose, Bld: 87 mg/dL (ref 70–99)
Potassium: 4 mmol/L (ref 3.5–5.1)
Sodium: 142 mmol/L (ref 135–145)
Total Bilirubin: 1.2 mg/dL (ref 0.3–1.2)
Total Protein: 6.5 g/dL (ref 6.5–8.1)

## 2021-09-08 LAB — CBC WITH DIFFERENTIAL (CANCER CENTER ONLY)
Abs Immature Granulocytes: 0.02 10*3/uL (ref 0.00–0.07)
Basophils Absolute: 0 10*3/uL (ref 0.0–0.1)
Basophils Relative: 0 %
Eosinophils Absolute: 0.1 10*3/uL (ref 0.0–0.5)
Eosinophils Relative: 1 %
HCT: 37.9 % (ref 36.0–46.0)
Hemoglobin: 12.6 g/dL (ref 12.0–15.0)
Immature Granulocytes: 0 %
Lymphocytes Relative: 18 %
Lymphs Abs: 1.3 10*3/uL (ref 0.7–4.0)
MCH: 29.9 pg (ref 26.0–34.0)
MCHC: 33.2 g/dL (ref 30.0–36.0)
MCV: 90 fL (ref 80.0–100.0)
Monocytes Absolute: 0.7 10*3/uL (ref 0.1–1.0)
Monocytes Relative: 10 %
Neutro Abs: 5.1 10*3/uL (ref 1.7–7.7)
Neutrophils Relative %: 71 %
Platelet Count: 333 10*3/uL (ref 150–400)
RBC: 4.21 MIL/uL (ref 3.87–5.11)
RDW: 14.6 % (ref 11.5–15.5)
WBC Count: 7.3 10*3/uL (ref 4.0–10.5)
nRBC: 0 % (ref 0.0–0.2)

## 2021-09-11 ENCOUNTER — Other Ambulatory Visit: Payer: Self-pay

## 2021-09-11 ENCOUNTER — Inpatient Hospital Stay: Payer: Medicare Other | Admitting: Internal Medicine

## 2021-09-11 VITALS — BP 127/72 | HR 93 | Temp 97.6°F | Resp 16 | Ht 64.0 in | Wt 105.1 lb

## 2021-09-11 DIAGNOSIS — Z5181 Encounter for therapeutic drug level monitoring: Secondary | ICD-10-CM

## 2021-09-11 DIAGNOSIS — J449 Chronic obstructive pulmonary disease, unspecified: Secondary | ICD-10-CM | POA: Diagnosis not present

## 2021-09-11 DIAGNOSIS — M810 Age-related osteoporosis without current pathological fracture: Secondary | ICD-10-CM | POA: Diagnosis not present

## 2021-09-11 DIAGNOSIS — D649 Anemia, unspecified: Secondary | ICD-10-CM | POA: Diagnosis not present

## 2021-09-11 DIAGNOSIS — C3491 Malignant neoplasm of unspecified part of right bronchus or lung: Secondary | ICD-10-CM

## 2021-09-11 DIAGNOSIS — C342 Malignant neoplasm of middle lobe, bronchus or lung: Secondary | ICD-10-CM | POA: Diagnosis not present

## 2021-09-11 DIAGNOSIS — Z8719 Personal history of other diseases of the digestive system: Secondary | ICD-10-CM | POA: Diagnosis not present

## 2021-09-11 DIAGNOSIS — Z79899 Other long term (current) drug therapy: Secondary | ICD-10-CM | POA: Diagnosis not present

## 2021-09-11 DIAGNOSIS — K219 Gastro-esophageal reflux disease without esophagitis: Secondary | ICD-10-CM | POA: Diagnosis not present

## 2021-09-11 DIAGNOSIS — Z85118 Personal history of other malignant neoplasm of bronchus and lung: Secondary | ICD-10-CM | POA: Diagnosis not present

## 2021-09-11 DIAGNOSIS — Z87442 Personal history of urinary calculi: Secondary | ICD-10-CM | POA: Diagnosis not present

## 2021-09-11 NOTE — Progress Notes (Signed)
[facility] [phone]   Fax:([phone]  OFFICE PROGRESS NOTE  [name], MD [place] [address]  DIAGNOSIS: Stage IIIB (T2a, N3, M0) non-small cell lung cancer, adenocarcinoma with positive EGFR mutation diagnosed in January 2010.  PRIOR THERAPY: 1) Status post concurrent chemoradiation with weekly carboplatin and paclitaxel; last dose given March 21, 2009.  2) Tarceva at 150 mg p.o. daily, status post approximately 48 months of treatment, discontinued secondary to persistent diarrhea.  3) status post right Pleurx catheter placement for right nonmalignant pleural effusion.  CURRENT THERAPY: Tarceva 100 mg by mouth daily started 03/19/2013, status post 102 months of treatment.  INTERVAL HISTORY: Dana [name] 86 y.o. female returns to the clinic today for follow-up visit.  The patient is feeling fine today with no concerning complaints except for fatigue.  She denied having any current chest pain, shortness of breath except with exertion with no cough or hemoptysis.  She denied having any fever or chills.  She has no nausea, vomiting, diarrhea or constipation.  She has no abdominal pain.  The patient denied having any rash.  She has no headache or visual changes.  She continues to tolerate her treatment with Tarceva fairly well.  She is here today for evaluation with repeat CT scan of the chest for restaging of her disease.   MEDICAL HISTORY: Past Medical History:  Diagnosis Date   Arthritis    HANDS,  WRISTS   COPD (chronic obstructive pulmonary disease) ([facility])    Depression 04/04/2017   Dyspnea on exertion    Encounter for therapeutic drug monitoring 10/01/2016   GERD (gastroesophageal reflux disease)    at times   Hemorrhoid    History of anal fissures    History of kidney stones    History of lung cancer ONCOLOGIST--  DR [name]--  LAST CT ,  NO RECURRENCE OR METS   DX JAN 2010 --  STAGE IIIA  NON-SMALL CELL  ADENOCARCINOMA (RIGHT MIDDLE LOBE)---  S/P  CHEMORADIATIO THERAPY  (COMPLETE 03-21-2009)   HTN (hypertension) 10/10/2017   white coat syndrome   Imbalance 06/04/2017   lung ca dx'd 2010   Normal cardiac stress test    2007  PER PT   Osteoporosis    Rash, skin    RIGHT FOREARM/ HAND   Right wrist pain    MASS   Wears glasses     ALLERGIES:  is allergic to methotrexate, clindamycin hcl, amoxicillin, and protonix [pantoprazole sodium].  MEDICATIONS:  Current Outpatient Medications  Medication Sig Dispense Refill   antiseptic oral rinse (BIOTENE) LIQD 15 mLs by Mouth Rinse route as needed for dry mouth.     b complex vitamins capsule Take 1 capsule by mouth daily.     bismuth subsalicylate (PEPTO BISMOL) 262 MG/15ML suspension Take 30 mLs by mouth every three (3) days as needed.     Calcium-Vitamin D ([employer] CALCIUM-600/VIT D PO) Take 2 tablets by mouth daily.     cholecalciferol (VITAMIN D) 1000 UNITS tablet Take 2,000 Units by mouth daily.     diclofenac (VOLTAREN) 75 MG EC tablet Take 75 mg by mouth 2 (two) times daily.     diclofenac Sodium (VOLTAREN) 1 % GEL Apply 2 g topically 4 (four) times daily as needed (pain).     doxycycline (ADOXA) 100 MG tablet Take 100 mg by mouth 2 (two) times daily. (Patient not taking: Reported on 09/05/2021)     erlotinib (TARCEVA) 100  MG tablet Take 1 tablet (100 mg total) by mouth daily. 30 tablet 3   lactase (LACTAID) 3000 units tablet Take 3,000 Units by mouth daily.     loratadine (CLARITIN) 10 MG tablet Take 10 mg by mouth daily as needed for allergies.     meloxicam (MOBIC) 15 MG tablet Take 15 mg by mouth daily as needed for pain. (Patient not taking: Reported on 09/05/2021)  4   mirtazapine (REMERON) 15 MG tablet TAKE 1 TABLET BY MOUTH ONCE DAILY AT BEDTIME 30 tablet 0   Misc Natural Products (OSTEO BI-FLEX JOINT SHIELD PO) Take 2 tablets by mouth daily.     oxycodone-acetaminophen (LYNOX) 10-300 MG tablet Take 1 tablet by mouth every 4 (four)  hours as needed for pain. (Patient not taking: Reported on 09/05/2021)     Polyethyl Glycol-Propyl Glycol (SYSTANE OP) Place 1 drop into both eyes at bedtime as needed (for dry eyes.).     Simethicone (GAS-X PO) Take 1 tablet by mouth 2 (two) times daily as needed (for gas).      Tiotropium Bromide-Olodaterol (STIOLTO RESPIMAT) 2.5-2.5 MCG/ACT AERS Inhale 2 puffs into the lungs daily. 4 g 11   No current facility-administered medications for this visit.    SURGICAL HISTORY:  Past Surgical History:  Procedure Laterality Date   ANAL FISSURE REPAIR  07/09/2011   INTERNAL SPHINCTEROTOMY   BENIGN EXCISION LEFT BREAST CENTRAL DUCT  02/1999   BREAST BIOPSY  01/31/2009   benign   BREAST EXCISIONAL BIOPSY Left 2004   benign   CHEST TUBE INSERTION Right 07/14/2014   Procedure: INSERTION PLEURAL DRAINAGE CATHETER RIGHT CHEST;  Surgeon: Grace Isaac, MD;  Location: [facility];  Service: Thoracic;  Laterality: Right;   EXCISION RIGHT WRIST MASS  2012   EXTRACORPOREAL SHOCK WAVE LITHOTRIPSY Left 06/01/2019   Procedure: EXTRACORPOREAL SHOCK WAVE LITHOTRIPSY (ESWL);  Surgeon: Cleon Gustin, MD;  Location: [facility];  Service: Urology;  Laterality: Left;   KNEE ARTHROSCOPY Right 1999   MASS EXCISION Right 03/11/2014   Procedure: RIGHT WRIST DEEP MASS EXCISION WITH CULTURE AND BIOSPY;  Surgeon: Linna Hoff, MD;  Location: [facility];  Service: Orthopedics;  Laterality: Right;   MICROLARYNGOSCOPY W/VOCAL CORD INJECTION Bilateral 02/08/2021   Procedure: MICROLARYNGOSCOPY WITH VOCAL CORD INJECTION;  Surgeon: Melida Quitter, MD;  Location: [facility];  Service: ENT;  Laterality: Bilateral;   REMOVAL OF PLEURAL DRAINAGE CATHETER Right 10/14/2014   Procedure: REMOVAL OF PLEURAL DRAINAGE CATHETER;  Surgeon: Grace Isaac, MD;  Location: [facility];  Service: Thoracic;  Laterality: Right;   TALC PLEURODESIS Right 09/16/2014   Procedure: TALC PLEURADESIS/ slurry;  Surgeon: Grace Isaac, MD;  Location:  [facility];  Service: Thoracic;  Laterality: Right;   THORACOSCOPY  01-26-2009   w/   lung/  node biopsy's   THUMB ARTHROSCOPY Left    - removed bone spur   TONSILLECTOMY  AS CHILD   TOTAL ABDOMINAL HYSTERECTOMY W/ BILATERAL SALPINGOOPHORECTOMY  1982   W/  APPENDECTOMY   TRANSTHORACIC ECHOCARDIOGRAM  12-23-2008   NORMAL LV/  EF 65-70%/  MILD MR  &  TR   VAULT SUSPENSION PLUS CYSTOCELE REPAIR WITH GRAFT  06-13-2010    REVIEW OF SYSTEMS:  Constitutional: positive for fatigue Eyes: negative Ears, nose, mouth, throat, and face: negative Respiratory: positive for dyspnea on exertion Cardiovascular: negative Gastrointestinal: negative Genitourinary:negative Integument/breast: negative Hematologic/lymphatic: negative Musculoskeletal:negative Neurological: negative Behavioral/Psych: negative Endocrine: negative Allergic/Immunologic: negative   PHYSICAL EXAMINATION: General appearance: alert, cooperative,  fatigued, and no distress Head: Normocephalic, without obvious abnormality, atraumatic Neck: no adenopathy, no JVD, supple, symmetrical, trachea midline, and thyroid not enlarged, symmetric, no tenderness/mass/nodules Lymph nodes: Cervical, supraclavicular, and axillary nodes normal. Resp: clear to auscultation bilaterally Back: symmetric, no curvature. ROM normal. No CVA tenderness. Cardio: regular rate and rhythm, S1, S2 normal, no murmur, click, rub or gallop GI: soft, non-tender; bowel sounds normal; no masses,  no organomegaly Extremities: extremities normal, atraumatic, no cyanosis or edema Neurologic: Alert and oriented X 3, normal strength and tone. Normal symmetric reflexes. Normal coordination and gait  ECOG PERFORMANCE STATUS: 1 - Symptomatic but completely ambulatory  Blood pressure 127/72, pulse 93, temperature 97.6 F (36.4 C), temperature source Tympanic, resp. rate 16, height $RemoveBe'5\' 4"'EEwEFOJhA$  (1.626 m), weight 105 lb 1.6 oz (47.7 kg), SpO2 99 %.  LABORATORY DATA: Lab Results   Component Value Date   WBC 7.3 09/08/2021   HGB 12.6 09/08/2021   HCT 37.9 09/08/2021   MCV 90.0 09/08/2021   PLT 333 09/08/2021      Chemistry      Component Value Date/Time   NA 142 09/08/2021 0903   NA 142 12/10/2017 1000   K 4.0 09/08/2021 0903   K 4.0 12/10/2017 1000   CL 107 09/08/2021 0903   CL 108 (H) 06/03/2013 0939   CO2 27 09/08/2021 0903   CO2 28 12/10/2017 1000   BUN 46 (H) 09/08/2021 0903   BUN 20.4 12/10/2017 1000   CREATININE 1.25 (H) 09/08/2021 0903   CREATININE 0.9 12/10/2017 1000      Component Value Date/Time   CALCIUM 9.5 09/08/2021 0903   CALCIUM 9.7 12/10/2017 1000   ALKPHOS 45 09/08/2021 0903   ALKPHOS 63 12/10/2017 1000   AST 28 09/08/2021 0903   AST 25 12/10/2017 1000   ALT 32 09/08/2021 0903   ALT 17 12/10/2017 1000   BILITOT 1.2 09/08/2021 0903   BILITOT 1.05 12/10/2017 1000       RADIOGRAPHIC STUDIES: CT Chest Wo Contrast  Result Date: 09/08/2021 CLINICAL DATA:  Primary Cancer Type: Lung Imaging Indication: Routine surveillance Interval therapy since last imaging? Yes Initial Cancer Diagnosis Date: 01/26/2009; Established by: Biopsy-proven Detailed Pathology: Stage IIIB non-small cell lung cancer, adenocarcinoma. Primary Tumor location:  Right middle lobe. Surgeries: Microlaryngoscopy with vocal cord injections 02/08/2021. Talc pleurodesis, right, 09/16/2014. Hysterectomy. Appendectomy. Chemotherapy: Yes; Ongoing? Yes; Most recent administration: Tarceva daily Immunotherapy? No Radiation therapy?  Yes; Date Range: 02/2009; Target: Right lung EXAM: CT CHEST WITHOUT CONTRAST TECHNIQUE: Multidetector CT imaging of the chest was performed following the standard protocol without IV contrast. COMPARISON:  Most recent CT chest 03/13/2021.  01/13/2009 PET-CT. FINDINGS: Cardiovascular: Coronary artery calcification and aortic atherosclerotic calcification. Mediastinum/Nodes: No axillary or supraclavicular adenopathy. No mediastinal or hilar adenopathy.  No pericardial fluid. Esophagus normal. Lungs/Pleura: Linear fibrosis along the medial RIGHT upper lobe with associated bronchiectasis the pattern typical of radiation change. A curve band of consolidation extends into the RIGHT lower lobe. No pattern change from comparison CT. Small RIGHT upper lobe pulmonary nodule measuring 3 mm (image 26/5) is unchanged from prior. Within the LEFT upper lobe nodular pleural-parenchymal thickening measuring 8 mm previously 6/5 comparison to 8 mm. Peribronchial nodularity in the LEFT lung apex (image 15/5 is also unchanged. No new pulmonary nodularity. Pleural high density in the medial RIGHT lower lobe related to talc pleurodesis. Upper Abdomen: No acute abnormality. Musculoskeletal: No aggressive osseous lesion IMPRESSION: 1. Stable postradiation change in the RIGHT lung. 2. Stable small nodules in  the LEFT and RIGHT lung. No new nodularity. 3. No mediastinal adenopathy. Electronically Signed   By: Suzy Bouchard M.D.   On: 09/08/2021 10:43    ASSESSMENT AND PLAN:  This is a very pleasant 86 years old white female with stage IIIB non-small cell lung cancer, adenocarcinoma with positive EGFR mutation diagnosed in January 2010. She has been on treatment with Tarceva for more than 9 years.   She is currently on Tarceva 100 mg p.o. daily status post 102  months. The patient has been tolerating her treatment with*fairly well with no significant adverse effects. She had repeat CT scan of the chest performed recently.  I personally and independently reviewed the scans and discussed the results with the patient today. Her scan showed no concerning findings for disease progression. I recommended for her to continue her current treatment with Tarceva with the same dose. I will see her back for follow-up visit in 2 months for evaluation with repeat blood work. The patient will continue on the oral iron tablets for the anemia. She was advised to call immediately if she has any  other concerning symptoms in the interval. The patient voices understanding of current disease status and treatment options and is in agreement with the current care plan. All questions were answered. The patient knows to call the clinic with any problems, questions or concerns. We can certainly see the patient much sooner if necessary. Disclaimer: This note was dictated with voice recognition software. Similar sounding words can inadvertently be transcribed and may not be corrected upon review.

## 2021-09-18 ENCOUNTER — Telehealth: Payer: Self-pay | Admitting: Internal Medicine

## 2021-09-18 DIAGNOSIS — M05431 Rheumatoid myopathy with rheumatoid arthritis of right wrist: Secondary | ICD-10-CM | POA: Diagnosis not present

## 2021-09-18 DIAGNOSIS — J449 Chronic obstructive pulmonary disease, unspecified: Secondary | ICD-10-CM | POA: Diagnosis not present

## 2021-09-18 DIAGNOSIS — E782 Mixed hyperlipidemia: Secondary | ICD-10-CM | POA: Diagnosis not present

## 2021-09-18 DIAGNOSIS — K219 Gastro-esophageal reflux disease without esophagitis: Secondary | ICD-10-CM | POA: Diagnosis not present

## 2021-09-18 DIAGNOSIS — I2510 Atherosclerotic heart disease of native coronary artery without angina pectoris: Secondary | ICD-10-CM | POA: Diagnosis not present

## 2021-09-18 DIAGNOSIS — M810 Age-related osteoporosis without current pathological fracture: Secondary | ICD-10-CM | POA: Diagnosis not present

## 2021-09-18 MED ORDER — STIOLTO RESPIMAT 2.5-2.5 MCG/ACT IN AERS: 2.0000 | INHALATION_SPRAY | Freq: Every day | RESPIRATORY_TRACT | 6 refills | Status: DC

## 2021-09-18 NOTE — Telephone Encounter (Signed)
LM informing patient refill has been sent in.   Nothing further needed at this time.

## 2021-10-18 DIAGNOSIS — L738 Other specified follicular disorders: Secondary | ICD-10-CM | POA: Diagnosis not present

## 2021-10-18 DIAGNOSIS — L821 Other seborrheic keratosis: Secondary | ICD-10-CM | POA: Diagnosis not present

## 2021-10-18 DIAGNOSIS — Z85828 Personal history of other malignant neoplasm of skin: Secondary | ICD-10-CM | POA: Diagnosis not present

## 2021-10-23 ENCOUNTER — Other Ambulatory Visit: Payer: Self-pay | Admitting: Internal Medicine

## 2021-10-23 DIAGNOSIS — C3491 Malignant neoplasm of unspecified part of right bronchus or lung: Secondary | ICD-10-CM

## 2021-11-14 ENCOUNTER — Other Ambulatory Visit: Payer: Self-pay

## 2021-11-14 ENCOUNTER — Inpatient Hospital Stay: Payer: Medicare Other | Admitting: Internal Medicine

## 2021-11-14 ENCOUNTER — Inpatient Hospital Stay: Payer: Medicare Other | Attending: Internal Medicine

## 2021-11-14 VITALS — BP 164/95 | HR 100 | Temp 97.8°F | Resp 20 | Ht 64.0 in | Wt 107.1 lb

## 2021-11-14 DIAGNOSIS — C342 Malignant neoplasm of middle lobe, bronchus or lung: Secondary | ICD-10-CM | POA: Insufficient documentation

## 2021-11-14 DIAGNOSIS — J449 Chronic obstructive pulmonary disease, unspecified: Secondary | ICD-10-CM | POA: Diagnosis not present

## 2021-11-14 DIAGNOSIS — K219 Gastro-esophageal reflux disease without esophagitis: Secondary | ICD-10-CM | POA: Diagnosis not present

## 2021-11-14 DIAGNOSIS — Z9221 Personal history of antineoplastic chemotherapy: Secondary | ICD-10-CM | POA: Insufficient documentation

## 2021-11-14 DIAGNOSIS — M810 Age-related osteoporosis without current pathological fracture: Secondary | ICD-10-CM | POA: Diagnosis not present

## 2021-11-14 DIAGNOSIS — Z801 Family history of malignant neoplasm of trachea, bronchus and lung: Secondary | ICD-10-CM | POA: Diagnosis not present

## 2021-11-14 DIAGNOSIS — D649 Anemia, unspecified: Secondary | ICD-10-CM | POA: Diagnosis not present

## 2021-11-14 DIAGNOSIS — Z87442 Personal history of urinary calculi: Secondary | ICD-10-CM | POA: Diagnosis not present

## 2021-11-14 DIAGNOSIS — J90 Pleural effusion, not elsewhere classified: Secondary | ICD-10-CM | POA: Diagnosis not present

## 2021-11-14 DIAGNOSIS — Z923 Personal history of irradiation: Secondary | ICD-10-CM | POA: Insufficient documentation

## 2021-11-14 DIAGNOSIS — R21 Rash and other nonspecific skin eruption: Secondary | ICD-10-CM | POA: Diagnosis not present

## 2021-11-14 DIAGNOSIS — C3491 Malignant neoplasm of unspecified part of right bronchus or lung: Secondary | ICD-10-CM

## 2021-11-14 DIAGNOSIS — I10 Essential (primary) hypertension: Secondary | ICD-10-CM | POA: Insufficient documentation

## 2021-11-14 DIAGNOSIS — Z79899 Other long term (current) drug therapy: Secondary | ICD-10-CM | POA: Insufficient documentation

## 2021-11-14 LAB — CMP (CANCER CENTER ONLY)
ALT: 34 U/L (ref 0–44)
AST: 36 U/L (ref 15–41)
Albumin: 3.4 g/dL — ABNORMAL LOW (ref 3.5–5.0)
Alkaline Phosphatase: 44 U/L (ref 38–126)
Anion gap: 7 (ref 5–15)
BUN: 29 mg/dL — ABNORMAL HIGH (ref 8–23)
CO2: 29 mmol/L (ref 22–32)
Calcium: 9.2 mg/dL (ref 8.9–10.3)
Chloride: 109 mmol/L (ref 98–111)
Creatinine: 0.85 mg/dL (ref 0.44–1.00)
GFR, Estimated: >60 mL/min (ref >60)
Glucose, Bld: 95 mg/dL (ref 70–99)
Potassium: 3.9 mmol/L (ref 3.5–5.1)
Sodium: 145 mmol/L (ref 135–145)
Total Bilirubin: 1 mg/dL (ref 0.3–1.2)
Total Protein: 5.9 g/dL — ABNORMAL LOW (ref 6.5–8.1)

## 2021-11-14 LAB — CBC WITH DIFFERENTIAL (CANCER CENTER ONLY)
Abs Immature Granulocytes: 0 10*3/uL (ref 0.00–0.07)
Basophils Absolute: 0 10*3/uL (ref 0.0–0.1)
Basophils Relative: 0 %
Eosinophils Absolute: 0.2 10*3/uL (ref 0.0–0.5)
Eosinophils Relative: 4 %
HCT: 36.7 % (ref 36.0–46.0)
Hemoglobin: 12.6 g/dL (ref 12.0–15.0)
Immature Granulocytes: 0 %
Lymphocytes Relative: 13 %
Lymphs Abs: 0.7 10*3/uL (ref 0.7–4.0)
MCH: 31.4 pg (ref 26.0–34.0)
MCHC: 34.3 g/dL (ref 30.0–36.0)
MCV: 91.5 fL (ref 80.0–100.0)
Monocytes Absolute: 0.6 10*3/uL (ref 0.1–1.0)
Monocytes Relative: 11 %
Neutro Abs: 3.7 10*3/uL (ref 1.7–7.7)
Neutrophils Relative %: 72 %
Platelet Count: 251 10*3/uL (ref 150–400)
RBC: 4.01 MIL/uL (ref 3.87–5.11)
RDW: 16 % — ABNORMAL HIGH (ref 11.5–15.5)
WBC Count: 5.2 10*3/uL (ref 4.0–10.5)
nRBC: 0 % (ref 0.0–0.2)

## 2021-11-14 NOTE — Progress Notes (Signed)
[facility] Telephone:([phone]   Fax:(336) [phone]  OFFICE PROGRESS NOTE  [name], MD [name] [address]  DIAGNOSIS: Stage IIIB (T2a, N3, M0) non-small cell lung cancer, adenocarcinoma with positive EGFR mutation diagnosed in January 2010.  PRIOR THERAPY: 1) Status post concurrent chemoradiation with weekly carboplatin and paclitaxel; last dose given March 21, 2009.  2) Tarceva at 150 mg p.o. daily, status post approximately 48 months of treatment, discontinued secondary to persistent diarrhea.  3) status post right Pleurx catheter placement for right nonmalignant pleural effusion.  CURRENT THERAPY: Tarceva 100 mg by mouth daily started 03/19/2013, status post 104 months of treatment.  INTERVAL HISTORY: Dana [name] 86 y.o. female returns to the clinic today for follow-up visit.  The patient is feeling fine today with no concerning complaints except for mild fatigue.  She denied having any significant chest pain, shortness of breath, cough or hemoptysis.  She has no current nausea, vomiting, diarrhea or constipation.  She has no headache or visual changes.  She denied having any recent weight loss or night sweats.  She is here today for evaluation and repeat blood work.   MEDICAL HISTORY: Past Medical History:  Diagnosis Date   Arthritis    HANDS,  WRISTS   COPD (chronic obstructive pulmonary disease) ([facility])    Depression 04/04/2017   Dyspnea on exertion    Encounter for therapeutic drug monitoring 10/01/2016   GERD (gastroesophageal reflux disease)    at times   Hemorrhoid    History of anal fissures    History of kidney stones    History of lung cancer ONCOLOGIST--  DR [name]--  LAST CT ,  NO RECURRENCE OR METS   DX JAN 2010 --  STAGE IIIA  NON-SMALL CELL ADENOCARCINOMA (RIGHT MIDDLE LOBE)---  S/P  CHEMORADIATIO THERAPY  (COMPLETE 03-21-2009)   HTN (hypertension) 10/10/2017   white coat syndrome   Imbalance 06/04/2017    lung ca dx'd 2010   Normal cardiac stress test    2007  PER PT   Osteoporosis    Rash, skin    RIGHT FOREARM/ HAND   Right wrist pain    MASS   Wears glasses     ALLERGIES:  is allergic to methotrexate, clindamycin hcl, amoxicillin, and protonix [pantoprazole sodium].  MEDICATIONS:  Current Outpatient Medications  Medication Sig Dispense Refill   antiseptic oral rinse (BIOTENE) LIQD 15 mLs by Mouth Rinse route as needed for dry mouth.     b complex vitamins capsule Take 1 capsule by mouth daily.     bismuth subsalicylate (PEPTO BISMOL) 262 MG/15ML suspension Take 30 mLs by mouth every three (3) days as needed.     Calcium-Vitamin D (CVS CALCIUM-600/VIT D PO) Take 2 tablets by mouth daily.     cholecalciferol (VITAMIN D) 1000 UNITS tablet Take 2,000 Units by mouth daily.     diclofenac (VOLTAREN) 75 MG EC tablet Take 75 mg by mouth 2 (two) times daily.     diclofenac Sodium (VOLTAREN) 1 % GEL Apply 2 g topically 4 (four) times daily as needed (pain).     doxycycline (ADOXA) 100 MG tablet Take 100 mg by mouth 2 (two) times daily. (Patient not taking: Reported on 09/05/2021)     erlotinib (TARCEVA) 100 MG tablet Take 1 tablet (100 mg total) by mouth daily. 30 tablet 3   lactase (LACTAID) 3000 units tablet Take 3,000 Units by mouth daily.     loratadine (  CLARITIN) 10 MG tablet Take 10 mg by mouth daily as needed for allergies.     meloxicam (MOBIC) 15 MG tablet Take 15 mg by mouth daily as needed for pain. (Patient not taking: Reported on 09/05/2021)  4   mirtazapine (REMERON) 15 MG tablet TAKE 1 TABLET BY MOUTH ONCE DAILY AT BEDTIME 30 tablet 0   Misc Natural Products (OSTEO BI-FLEX JOINT SHIELD PO) Take 2 tablets by mouth daily.     oxycodone-acetaminophen (LYNOX) 10-300 MG tablet Take 1 tablet by mouth every 4 (four) hours as needed for pain. (Patient not taking: Reported on 09/05/2021)     Polyethyl Glycol-Propyl Glycol (SYSTANE OP) Place 1 drop into both eyes at bedtime as needed (for  dry eyes.).     RESTASIS 0.05 % ophthalmic emulsion 1 drop 2 (two) times daily.     Simethicone (GAS-X PO) Take 1 tablet by mouth 2 (two) times daily as needed (for gas).      Tiotropium Bromide-Olodaterol (STIOLTO RESPIMAT) 2.5-2.5 MCG/ACT AERS Inhale 2 puffs into the lungs daily. 4 g 6   vitamin B-12 (CYANOCOBALAMIN) 1000 MCG tablet Take 1 tablet by mouth daily.     No current facility-administered medications for this visit.    SURGICAL HISTORY:  Past Surgical History:  Procedure Laterality Date   ANAL FISSURE REPAIR  07/09/2011   INTERNAL SPHINCTEROTOMY   BENIGN EXCISION LEFT BREAST CENTRAL DUCT  02/1999   BREAST BIOPSY  01/31/2009   benign   BREAST EXCISIONAL BIOPSY Left 2004   benign   CHEST TUBE INSERTION Right 07/14/2014   Procedure: INSERTION PLEURAL DRAINAGE CATHETER RIGHT CHEST;  Surgeon: Grace Isaac, MD;  Location: [facility];  Service: Thoracic;  Laterality: Right;   EXCISION RIGHT WRIST MASS  2012   EXTRACORPOREAL SHOCK WAVE LITHOTRIPSY Left 06/01/2019   Procedure: EXTRACORPOREAL SHOCK WAVE LITHOTRIPSY (ESWL);  Surgeon: Cleon Gustin, MD;  Location[facility];  Service: Urology;  Laterality: Left;   KNEE ARTHROSCOPY Right 1999   MASS EXCISION Right 03/11/2014   Procedure: RIGHT WRIST DEEP MASS EXCISION WITH CULTURE AND BIOSPY;  Surgeon: Linna Hoff, MD;  Location: [facility];  Service: Orthopedics;  Laterality: Right;   MICROLARYNGOSCOPY W/VOCAL CORD INJECTION Bilateral 02/08/2021   Procedure: MICROLARYNGOSCOPY WITH VOCAL CORD INJECTION;  Surgeon: Melida Quitter, MD;  Location: [facility];  Service: ENT;  Laterality: Bilateral;   REMOVAL OF PLEURAL DRAINAGE CATHETER Right 10/14/2014   Procedure: REMOVAL OF PLEURAL DRAINAGE CATHETER;  Surgeon: Grace Isaac, MD;  Location: [facility];  Service: Thoracic;  Laterality: Right;   TALC PLEURODESIS Right 09/16/2014   Procedure: TALC PLEURADESIS/ slurry;  Surgeon: Grace Isaac, MD;  Location: [facility];  Service:  Thoracic;  Laterality: Right;   THORACOSCOPY  01-26-2009   w/   lung/  node biopsy's   THUMB ARTHROSCOPY Left    - removed bone spur   TONSILLECTOMY  AS CHILD   TOTAL ABDOMINAL HYSTERECTOMY W/ BILATERAL SALPINGOOPHORECTOMY  1982   W/  APPENDECTOMY   TRANSTHORACIC ECHOCARDIOGRAM  12-23-2008   NORMAL LV/  EF 65-70%/  MILD MR  &  TR   VAULT SUSPENSION PLUS CYSTOCELE REPAIR WITH GRAFT  06-13-2010    REVIEW OF SYSTEMS:  A comprehensive review of systems was negative except for: Constitutional: positive for fatigue   PHYSICAL EXAMINATION: General appearance: alert, cooperative, fatigued, and no distress Head: Normocephalic, without obvious abnormality, atraumatic Neck: no adenopathy, no JVD, supple, symmetrical, trachea midline, and thyroid not enlarged, symmetric, no  tenderness/mass/nodules Lymph nodes: Cervical, supraclavicular, and axillary nodes normal. Resp: clear to auscultation bilaterally Back: symmetric, no curvature. ROM normal. No CVA tenderness. Cardio: regular rate and rhythm, S1, S2 normal, no murmur, click, rub or gallop GI: soft, non-tender; bowel sounds normal; no masses,  no organomegaly Extremities: extremities normal, atraumatic, no cyanosis or edema  ECOG PERFORMANCE STATUS: 1 - Symptomatic but completely ambulatory  Blood pressure (!) 164/95, pulse 100, temperature 97.8 F (36.6 C), temperature source Tympanic, resp. rate 20, height $RemoveBe'5\' 4"'QNvCtPDnM$  (1.626 m), weight 107 lb 1.6 oz (48.6 kg), SpO2 97 %.  LABORATORY DATA: Lab Results  Component Value Date   WBC 5.2 11/14/2021   HGB 12.6 11/14/2021   HCT 36.7 11/14/2021   MCV 91.5 11/14/2021   PLT 251 11/14/2021      Chemistry      Component Value Date/Time   NA 142 09/08/2021 0903   NA 142 12/10/2017 1000   K 4.0 09/08/2021 0903   K 4.0 12/10/2017 1000   CL 107 09/08/2021 0903   CL 108 (H) 06/03/2013 0939   CO2 27 09/08/2021 0903   CO2 28 12/10/2017 1000   BUN 46 (H) 09/08/2021 0903   BUN 20.4 12/10/2017 1000    CREATININE 1.25 (H) 09/08/2021 0903   CREATININE 0.9 12/10/2017 1000      Component Value Date/Time   CALCIUM 9.5 09/08/2021 0903   CALCIUM 9.7 12/10/2017 1000   ALKPHOS 45 09/08/2021 0903   ALKPHOS 63 12/10/2017 1000   AST 28 09/08/2021 0903   AST 25 12/10/2017 1000   ALT 32 09/08/2021 0903   ALT 17 12/10/2017 1000   BILITOT 1.2 09/08/2021 0903   BILITOT 1.05 12/10/2017 1000       RADIOGRAPHIC STUDIES: No results found.  ASSESSMENT AND PLAN:  This is a very pleasant 86 years old white female with stage IIIB non-small cell lung cancer, adenocarcinoma with positive EGFR mutation diagnosed in January 2010. She has been on treatment with Tarceva for more than 9 years.   She is currently on Tarceva 100 mg p.o. daily status post 104  months. The patient continues to tolerate this treatment well with no concerning adverse effects. I recommended for her to continue her current treatment with Tarceva 100 mg p.o. daily. I will see her back for follow-up visit in 2 months for evaluation and repeat blood work. The patient was advised to call immediately if she has any other concerning symptoms in the interval. The patient will continue on the oral iron tablets for the anemia. The patient voices understanding of current disease status and treatment options and is in agreement with the current care plan. All questions were answered. The patient knows to call the clinic with any problems, questions or concerns. We can certainly see the patient much sooner if necessary. Disclaimer: This note was dictated with voice recognition software. Similar sounding words can inadvertently be transcribed and may not be corrected upon review.

## 2021-11-27 NOTE — Progress Notes (Signed)
Dana [name] D.Dana [name] Phone: [phone]   Assessment and Plan:     1. Osteoarthritis of right knee, unspecified osteoarthritis type -Chronic with exacerbation, subsequent visit - Continued right knee pain with patient receiving no relief from previous 2 intra-articular CSI - Patient elected to trial HA injection today.  Tolerated well per note below  Procedure: Knee Joint Injection Side: Right Indication: OA  Risks explained and consent was given verbally. The site was cleaned with alcohol prep. A needle was introduced with an anterio-lateral approach. Injection given using 60 mg / 3 mL Durolane. This was well tolerated and resulted in symptomatic relief.  Needle was removed, hemostasis achieved, and post injection instructions were explained.   Pt was advised to call or return to clinic if these symptoms worsen or fail to improve as anticipated.    Pertinent previous records reviewed include none   Follow Up: 1 month for reevaluation of chronic R knee OA and pain     Subjective:   I, Dana [name], am serving as a Education administrator for Doctor Glennon Mac  Chief Complaint: Right knee pain   HPI:   09/05/21 Patient states that she has been seeing Dr. Georgina Snell for right knee pain and gets steroid injections to help with the pain. Last steroid injection was 08/02/2020.  Patient states that she had no relief of pain from previous injection.  Patient usually only has pain while walking it is fine at rest. Patient will use the votaren gel to help with pain. Patient ambulates with a walker. Patient locates pain to Medial Right patella.   11/28/21 Patient states knee feels terrible today all over  has a small knee brace on   Relevant Historical Information: lung cancer, chronic knee pain with OA   Additional pertinent review of systems negative.   Current Outpatient Medications:    antiseptic oral rinse  (BIOTENE) LIQD, 15 mLs by Mouth Rinse route as needed for dry mouth., Disp: , Rfl:    b complex vitamins capsule, Take 1 capsule by mouth daily., Disp: , Rfl:    bismuth subsalicylate (PEPTO BISMOL) 262 MG/15ML suspension, Take 30 mLs by mouth every three (3) days as needed., Disp: , Rfl:    Calcium-Vitamin D ([employer] CALCIUM-600/VIT D PO), Take 2 tablets by mouth daily., Disp: , Rfl:    cholecalciferol (VITAMIN D) 1000 UNITS tablet, Take 2,000 Units by mouth daily., Disp: , Rfl:    diclofenac (VOLTAREN) 75 MG EC tablet, Take 75 mg by mouth 2 (two) times daily., Disp: , Rfl:    diclofenac Sodium (VOLTAREN) 1 % GEL, Apply 2 g topically 4 (four) times daily as needed (pain)., Disp: , Rfl:    doxycycline (ADOXA) 100 MG tablet, Take 100 mg by mouth 2 (two) times daily. (Patient not taking: Reported on 09/05/2021), Disp: , Rfl:    erlotinib (TARCEVA) 100 MG tablet, Take 1 tablet (100 mg total) by mouth daily., Disp: 30 tablet, Rfl: 3   lactase (LACTAID) 3000 units tablet, Take 3,000 Units by mouth daily., Disp: , Rfl:    loratadine (CLARITIN) 10 MG tablet, Take 10 mg by mouth daily as needed for allergies., Disp: , Rfl:    meloxicam (MOBIC) 15 MG tablet, Take 15 mg by mouth daily as needed for pain. (Patient not taking: Reported on 09/05/2021), Disp: , Rfl: 4   mirtazapine (REMERON) 15 MG tablet, TAKE 1 TABLET BY MOUTH ONCE DAILY AT BEDTIME, Disp: 30 tablet,  Rfl: 0   Misc Natural Products (OSTEO BI-FLEX JOINT SHIELD PO), Take 2 tablets by mouth daily., Disp: , Rfl:    oxycodone-acetaminophen (LYNOX) 10-300 MG tablet, Take 1 tablet by mouth every 4 (four) hours as needed for pain. (Patient not taking: Reported on 09/05/2021), Disp: , Rfl:    Polyethyl Glycol-Propyl Glycol (SYSTANE OP), Place 1 drop into both eyes at bedtime as needed (for dry eyes.)., Disp: , Rfl:    RESTASIS 0.05 % ophthalmic emulsion, 1 drop 2 (two) times daily., Disp: , Rfl:    Simethicone (GAS-X PO), Take 1 tablet by mouth 2 (two) times  daily as needed (for gas). , Disp: , Rfl:    Tiotropium Bromide-Olodaterol (STIOLTO RESPIMAT) 2.5-2.5 MCG/ACT AERS, Inhale 2 puffs into the lungs daily., Disp: 4 g, Rfl: 6   vitamin B-12 (CYANOCOBALAMIN) 1000 MCG tablet, Take 1 tablet by mouth daily., Disp: , Rfl:    Objective:     Vitals:   11/28/21 1309  Pulse: 65  SpO2: 95%  Weight: 108 lb (49 kg)  Height: 5\' 4"  (1.626 m)      Body mass index is 18.54 kg/m.    Physical Exam:    General:  awake, alert oriented, no acute distress nontoxic Skin: no suspicious lesions or rashes Neuro:sensation intact, no deficits, strength 5/5 with no deficits, no atrophy, normal muscle tone Psych: No signs of anxiety, depression or other mood disorder   Right knee: crepitus No swelling No deformity Neg fluid wave, joint milking ROM Flex 100, Ext 10 TTP medial jt line,  NTTP over the quad tendon, medial fem condyle, lat fem condyle, patella, plica, patella tendon, tibial tuberostiy, fibular head, posterior fossa, pes anserine bursa, gerdy's tubercle, lateral jt line Neg anterior and posterior drawer Neg lachman Neg sag sign Negative varus stress Negative valgus stress Negative [place]      Electronically signed by:  Dana [name] D.[name] 1:28 PM 11/28/21

## 2021-11-28 ENCOUNTER — Other Ambulatory Visit: Payer: Self-pay

## 2021-11-28 ENCOUNTER — Ambulatory Visit (INDEPENDENT_AMBULATORY_CARE_PROVIDER_SITE_OTHER): Payer: Medicare Other | Admitting: Sports Medicine

## 2021-11-28 VITALS — HR 65 | Ht 64.0 in | Wt 108.0 lb

## 2021-11-28 DIAGNOSIS — M1711 Unilateral primary osteoarthritis, right knee: Secondary | ICD-10-CM

## 2021-11-28 NOTE — Patient Instructions (Signed)
Good to see you   

## 2021-11-29 DIAGNOSIS — L98492 Non-pressure chronic ulcer of skin of other sites with fat layer exposed: Secondary | ICD-10-CM | POA: Diagnosis not present

## 2021-11-29 DIAGNOSIS — L309 Dermatitis, unspecified: Secondary | ICD-10-CM | POA: Diagnosis not present

## 2021-12-08 DIAGNOSIS — Z0000 Encounter for general adult medical examination without abnormal findings: Secondary | ICD-10-CM | POA: Diagnosis not present

## 2021-12-11 DIAGNOSIS — M810 Age-related osteoporosis without current pathological fracture: Secondary | ICD-10-CM | POA: Diagnosis not present

## 2021-12-11 DIAGNOSIS — L98499 Non-pressure chronic ulcer of skin of other sites with unspecified severity: Secondary | ICD-10-CM | POA: Diagnosis not present

## 2021-12-11 DIAGNOSIS — C3490 Malignant neoplasm of unspecified part of unspecified bronchus or lung: Secondary | ICD-10-CM | POA: Diagnosis not present

## 2021-12-11 DIAGNOSIS — K219 Gastro-esophageal reflux disease without esophagitis: Secondary | ICD-10-CM | POA: Diagnosis not present

## 2021-12-11 DIAGNOSIS — M25561 Pain in right knee: Secondary | ICD-10-CM | POA: Diagnosis not present

## 2021-12-11 DIAGNOSIS — G479 Sleep disorder, unspecified: Secondary | ICD-10-CM | POA: Diagnosis not present

## 2021-12-11 DIAGNOSIS — M05431 Rheumatoid myopathy with rheumatoid arthritis of right wrist: Secondary | ICD-10-CM | POA: Diagnosis not present

## 2021-12-11 DIAGNOSIS — G8929 Other chronic pain: Secondary | ICD-10-CM | POA: Diagnosis not present

## 2021-12-11 DIAGNOSIS — J449 Chronic obstructive pulmonary disease, unspecified: Secondary | ICD-10-CM | POA: Diagnosis not present

## 2021-12-13 ENCOUNTER — Other Ambulatory Visit: Payer: Self-pay | Admitting: Internal Medicine

## 2021-12-13 DIAGNOSIS — C3491 Malignant neoplasm of unspecified part of right bronchus or lung: Secondary | ICD-10-CM

## 2021-12-28 NOTE — Progress Notes (Signed)
Dana [name] D.[place] Phone: [phone]   Assessment and Plan:     1. Osteoarthritis of right knee, unspecified osteoarthritis type -Chronic with exacerbation, subsequent visit - Minimal relief with HA injection at previous office visit - Patient has received 0 relief from past 2 CSI and only minimal relief with previous HA injection - Patient has no interest in having a knee replacement, so we will not refer to orthopedic surgery at this time - Increase to Tylenol 500 mg 3 times daily for daily pain relief - May use Voltaren gel as needed for pain relief - Patient has a custom knee brace which she may use as needed for pain relief - As patient received some benefit from HA injection.  We can repeat again in 5 months   Pertinent previous records reviewed include none   Follow Up: 5 months for repeat HA injections or sooner if worsening pain   Subjective:   I, Pincus Badder, am serving as a Education administrator for Doctor Glennon Mac  Chief Complaint: knee pain   HPI:   09/05/21 Patient states that she has been seeing Dr. Georgina Snell for right knee pain and gets steroid injections to help with the pain. Last steroid injection was 08/02/2020.  Patient states that she had no relief of pain from previous injection.  Patient usually only has pain while walking it is fine at rest. Patient will use the votaren gel to help with pain. Patient ambulates with a walker. Patient locates pain to Medial Right patella.   11/28/21 Patient states knee feels terrible today all over  has a small knee brace on   12/29/2021 Patient states that the knee isn't better has a small brace on today cant get the big brace on and off . With the big brace and roll aider can walk but the knee is not better. Would like a print out of shot that was given     Relevant Historical Information: lung cancer, chronic knee pain with OA       Additional pertinent review of systems negative.   Current Outpatient Medications:    antiseptic oral rinse (BIOTENE) LIQD, 15 mLs by Mouth Rinse route as needed for dry mouth., Disp: , Rfl:    b complex vitamins capsule, Take 1 capsule by mouth daily., Disp: , Rfl:    bismuth subsalicylate (PEPTO BISMOL) 262 MG/15ML suspension, Take 30 mLs by mouth every three (3) days as needed., Disp: , Rfl:    Calcium-Vitamin D ([employer] CALCIUM-600/VIT D PO), Take 2 tablets by mouth daily., Disp: , Rfl:    cholecalciferol (VITAMIN D) 1000 UNITS tablet, Take 2,000 Units by mouth daily., Disp: , Rfl:    diclofenac (VOLTAREN) 75 MG EC tablet, Take 75 mg by mouth 2 (two) times daily., Disp: , Rfl:    diclofenac Sodium (VOLTAREN) 1 % GEL, Apply 2 g topically 4 (four) times daily as needed (pain)., Disp: , Rfl:    doxycycline (ADOXA) 100 MG tablet, Take 100 mg by mouth 2 (two) times daily., Disp: , Rfl:    erlotinib (TARCEVA) 100 MG tablet, Take 1 tablet (100 mg total) by mouth daily., Disp: 30 tablet, Rfl: 3   lactase (LACTAID) 3000 units tablet, Take 3,000 Units by mouth daily., Disp: , Rfl:    loratadine (CLARITIN) 10 MG tablet, Take 10 mg by mouth daily as needed for allergies., Disp: , Rfl:    meloxicam (MOBIC) 15 MG tablet, Take  15 mg by mouth daily as needed for pain., Disp: , Rfl: 4   mirtazapine (REMERON) 15 MG tablet, TAKE 1 TABLET BY MOUTH ONCE DAILY AT BEDTIME, Disp: 30 tablet, Rfl: 0   Misc Natural Products (OSTEO BI-FLEX JOINT SHIELD PO), Take 2 tablets by mouth daily., Disp: , Rfl:    oxycodone-acetaminophen (LYNOX) 10-300 MG tablet, Take 1 tablet by mouth every 4 (four) hours as needed for pain., Disp: , Rfl:    Polyethyl Glycol-Propyl Glycol (SYSTANE OP), Place 1 drop into both eyes at bedtime as needed (for dry eyes.)., Disp: , Rfl:    RESTASIS 0.05 % ophthalmic emulsion, 1 drop 2 (two) times daily., Disp: , Rfl:    Simethicone (GAS-X PO), Take 1 tablet by mouth 2 (two) times daily as needed  (for gas). , Disp: , Rfl:    Tiotropium Bromide-Olodaterol (STIOLTO RESPIMAT) 2.5-2.5 MCG/ACT AERS, Inhale 2 puffs into the lungs daily., Disp: 4 g, Rfl: 6   vitamin B-12 (CYANOCOBALAMIN) 1000 MCG tablet, Take 1 tablet by mouth daily., Disp: , Rfl:    Objective:     Vitals:   12/29/21 1322  BP: 140/80  Pulse: (!) 105  SpO2: 98%  Height: 5\' 4"  (1.626 m)      Body mass index is 18.54 kg/m.    Physical Exam:    General:  awake, alert oriented, no acute distress nontoxic Skin: no suspicious lesions or rashes Neuro:sensation intact, no deficits, strength 5/5 with no deficits, no atrophy, normal muscle tone Psych: No signs of anxiety, depression or other mood disorder   Right knee: crepitus No swelling No deformity Neg fluid wave, joint milking ROM Flex 100, Ext 10 TTP medial jt line,  NTTP over the quad tendon, medial fem condyle, lat fem condyle, patella, plica, patella tendon, tibial tuberostiy, fibular head, posterior fossa, pes anserine bursa, gerdy's tubercle, lateral jt line Neg anterior and posterior drawer Neg lachman Neg sag sign Negative varus stress Negative valgus stress   Electronically signed by:  Dana [name] D.[name] 2:16 PM 12/29/21

## 2021-12-29 ENCOUNTER — Ambulatory Visit (INDEPENDENT_AMBULATORY_CARE_PROVIDER_SITE_OTHER): Payer: Medicare Other | Admitting: Sports Medicine

## 2021-12-29 ENCOUNTER — Other Ambulatory Visit: Payer: Self-pay

## 2021-12-29 VITALS — BP 140/80 | HR 105 | Ht 64.0 in

## 2021-12-29 DIAGNOSIS — M1711 Unilateral primary osteoarthritis, right knee: Secondary | ICD-10-CM | POA: Diagnosis not present

## 2021-12-29 NOTE — Patient Instructions (Addendum)
Good to see you  Durolane -[place] pamphlet provided  Can take tylenol arthritis 3 times a day for pain control as well as Voltaren gel daily for pain  5 month follow up for repeat HA injection

## 2022-01-04 ENCOUNTER — Ambulatory Visit
Admission: RE | Admit: 2022-01-04 | Discharge: 2022-01-04 | Disposition: A | Discharge status: Home / Self Care | Payer: Medicare Other | Source: Ambulatory Visit | Attending: Family Medicine | Admitting: Family Medicine

## 2022-01-04 DIAGNOSIS — M810 Age-related osteoporosis without current pathological fracture: Secondary | ICD-10-CM | POA: Diagnosis not present

## 2022-01-04 DIAGNOSIS — Z1231 Encounter for screening mammogram for malignant neoplasm of breast: Secondary | ICD-10-CM

## 2022-01-04 DIAGNOSIS — Z780 Asymptomatic menopausal state: Secondary | ICD-10-CM | POA: Diagnosis not present

## 2022-01-12 DIAGNOSIS — M810 Age-related osteoporosis without current pathological fracture: Secondary | ICD-10-CM | POA: Diagnosis not present

## 2022-01-15 ENCOUNTER — Inpatient Hospital Stay: Payer: Medicare Other | Admitting: Internal Medicine

## 2022-01-15 ENCOUNTER — Encounter: Payer: Self-pay | Admitting: Internal Medicine

## 2022-01-15 ENCOUNTER — Other Ambulatory Visit: Payer: Self-pay

## 2022-01-15 ENCOUNTER — Other Ambulatory Visit: Payer: Self-pay | Admitting: Medical Oncology

## 2022-01-15 ENCOUNTER — Inpatient Hospital Stay: Payer: Medicare Other | Attending: Internal Medicine

## 2022-01-15 VITALS — BP 167/100 | HR 95 | Temp 96.2°F | Resp 18 | Ht 64.0 in | Wt 107.7 lb

## 2022-01-15 DIAGNOSIS — Z801 Family history of malignant neoplasm of trachea, bronchus and lung: Secondary | ICD-10-CM | POA: Diagnosis not present

## 2022-01-15 DIAGNOSIS — D509 Iron deficiency anemia, unspecified: Secondary | ICD-10-CM | POA: Insufficient documentation

## 2022-01-15 DIAGNOSIS — Z87442 Personal history of urinary calculi: Secondary | ICD-10-CM | POA: Insufficient documentation

## 2022-01-15 DIAGNOSIS — M810 Age-related osteoporosis without current pathological fracture: Secondary | ICD-10-CM | POA: Diagnosis not present

## 2022-01-15 DIAGNOSIS — J449 Chronic obstructive pulmonary disease, unspecified: Secondary | ICD-10-CM | POA: Insufficient documentation

## 2022-01-15 DIAGNOSIS — C3490 Malignant neoplasm of unspecified part of unspecified bronchus or lung: Secondary | ICD-10-CM

## 2022-01-15 DIAGNOSIS — I10 Essential (primary) hypertension: Secondary | ICD-10-CM | POA: Diagnosis not present

## 2022-01-15 DIAGNOSIS — Z923 Personal history of irradiation: Secondary | ICD-10-CM | POA: Insufficient documentation

## 2022-01-15 DIAGNOSIS — K219 Gastro-esophageal reflux disease without esophagitis: Secondary | ICD-10-CM | POA: Insufficient documentation

## 2022-01-15 DIAGNOSIS — C342 Malignant neoplasm of middle lobe, bronchus or lung: Secondary | ICD-10-CM | POA: Insufficient documentation

## 2022-01-15 DIAGNOSIS — C3491 Malignant neoplasm of unspecified part of right bronchus or lung: Secondary | ICD-10-CM

## 2022-01-15 DIAGNOSIS — Z79899 Other long term (current) drug therapy: Secondary | ICD-10-CM | POA: Diagnosis not present

## 2022-01-15 DIAGNOSIS — Z9221 Personal history of antineoplastic chemotherapy: Secondary | ICD-10-CM | POA: Insufficient documentation

## 2022-01-15 DIAGNOSIS — M129 Arthropathy, unspecified: Secondary | ICD-10-CM | POA: Diagnosis not present

## 2022-01-15 DIAGNOSIS — Z5181 Encounter for therapeutic drug level monitoring: Secondary | ICD-10-CM | POA: Diagnosis not present

## 2022-01-15 LAB — CBC WITH DIFFERENTIAL (CANCER CENTER ONLY)
Abs Immature Granulocytes: 0.01 10*3/uL (ref 0.00–0.07)
Basophils Absolute: 0 10*3/uL (ref 0.0–0.1)
Basophils Relative: 0 %
Eosinophils Absolute: 0.2 10*3/uL (ref 0.0–0.5)
Eosinophils Relative: 2 %
HCT: 39.1 % (ref 36.0–46.0)
Hemoglobin: 13.1 g/dL (ref 12.0–15.0)
Immature Granulocytes: 0 %
Lymphocytes Relative: 13 %
Lymphs Abs: 0.9 10*3/uL (ref 0.7–4.0)
MCH: 31.6 pg (ref 26.0–34.0)
MCHC: 33.5 g/dL (ref 30.0–36.0)
MCV: 94.2 fL (ref 80.0–100.0)
Monocytes Absolute: 0.5 10*3/uL (ref 0.1–1.0)
Monocytes Relative: 8 %
Neutro Abs: 5.2 10*3/uL (ref 1.7–7.7)
Neutrophils Relative %: 77 %
Platelet Count: 264 10*3/uL (ref 150–400)
RBC: 4.15 MIL/uL (ref 3.87–5.11)
RDW: 15.4 % (ref 11.5–15.5)
WBC Count: 6.8 10*3/uL (ref 4.0–10.5)
nRBC: 0 % (ref 0.0–0.2)

## 2022-01-15 LAB — CMP (CANCER CENTER ONLY)
ALT: 35 U/L (ref 0–44)
AST: 43 U/L — ABNORMAL HIGH (ref 15–41)
Albumin: 4.2 g/dL (ref 3.5–5.0)
Alkaline Phosphatase: 44 U/L (ref 38–126)
Anion gap: 7 (ref 5–15)
BUN: 30 mg/dL — ABNORMAL HIGH (ref 8–23)
CO2: 29 mmol/L (ref 22–32)
Calcium: 9.5 mg/dL (ref 8.9–10.3)
Chloride: 104 mmol/L (ref 98–111)
Creatinine: 0.94 mg/dL (ref 0.44–1.00)
GFR, Estimated: 59 mL/min — ABNORMAL LOW (ref >60)
Glucose, Bld: 94 mg/dL (ref 70–99)
Potassium: 4.1 mmol/L (ref 3.5–5.1)
Sodium: 140 mmol/L (ref 135–145)
Total Bilirubin: 1 mg/dL (ref 0.3–1.2)
Total Protein: 6.7 g/dL (ref 6.5–8.1)

## 2022-01-15 MED ORDER — ERLOTINIB HCL 100 MG PO TABS: 100.0000 mg | ORAL_TABLET | Freq: Every day | ORAL | 3 refills | Status: DC

## 2022-01-15 NOTE — Progress Notes (Signed)
[place] [phone]   Fax:(336) [phone]  OFFICE PROGRESS NOTE  [name], MD [place] [address]  DIAGNOSIS: Stage IIIB (T2a, N3, M0) non-small cell lung cancer, adenocarcinoma with positive EGFR mutation diagnosed in January 2010.  PRIOR THERAPY: 1) Status post concurrent chemoradiation with weekly carboplatin and paclitaxel; last dose given March 21, 2009.  2) Tarceva at 150 mg p.o. daily, status post approximately 48 months of treatment, discontinued secondary to persistent diarrhea.  3) status post right Pleurx catheter placement for right nonmalignant pleural effusion.  CURRENT THERAPY: Tarceva 100 mg by mouth daily started 03/19/2013, status post 106  months of treatment.  INTERVAL HISTORY: Dana [name] 87 y.o. female returns to the clinic today for follow-up visit.  The patient is feeling fine today with no concerning complaints except for mild fatigue.  She mentions that Remeron made her more foggy and drowsy in the morning.  She would like to cut her dose of the medication.  She denied having any current chest pain but has shortness of breath with exertion with no cough or hemoptysis.  She celebrated her 87th birthday last week.  She has no nausea, vomiting, diarrhea or constipation.  She denied having any headache or visual changes.  She continues to tolerate her treatment with Tarceva fairly well.  The patient is here today for evaluation and repeat blood work.  MEDICAL HISTORY: Past Medical History:  Diagnosis Date   Arthritis    HANDS,  WRISTS   COPD (chronic obstructive pulmonary disease) ([place])    Depression 04/04/2017   Dyspnea on exertion    Encounter for therapeutic drug monitoring 10/01/2016   GERD (gastroesophageal reflux disease)    at times   Hemorrhoid    History of anal fissures    History of kidney stones    History of lung cancer ONCOLOGIST--  DR [name]--  LAST CT ,  NO RECURRENCE OR METS    DX JAN 2010 --  STAGE IIIA  NON-SMALL CELL ADENOCARCINOMA (RIGHT MIDDLE LOBE)---  S/P  CHEMORADIATIO THERAPY  (COMPLETE 03-21-2009)   HTN (hypertension) 10/10/2017   white coat syndrome   Imbalance 06/04/2017   lung ca dx'd 2010   Normal cardiac stress test    2007  PER PT   Osteoporosis    Rash, skin    RIGHT FOREARM/ HAND   Right wrist pain    MASS   Wears glasses     ALLERGIES:  is allergic to methotrexate, clindamycin hcl, amoxicillin, and protonix [pantoprazole sodium].  MEDICATIONS:  Current Outpatient Medications  Medication Sig Dispense Refill   antiseptic oral rinse (BIOTENE) LIQD 15 mLs by Mouth Rinse route as needed for dry mouth.     b complex vitamins capsule Take 1 capsule by mouth daily.     bismuth subsalicylate (PEPTO BISMOL) 262 MG/15ML suspension Take 30 mLs by mouth every three (3) days as needed.     Calcium-Vitamin D (CVS CALCIUM-600/VIT D PO) Take 2 tablets by mouth daily.     cholecalciferol (VITAMIN D) 1000 UNITS tablet Take 2,000 Units by mouth daily.     diclofenac Sodium (VOLTAREN) 1 % GEL Apply 2 g topically 4 (four) times daily as needed (pain).     doxycycline (ADOXA) 100 MG tablet Take 100 mg by mouth 2 (two) times daily.     erlotinib (TARCEVA) 100 MG tablet Take 1 tablet (100 mg total) by mouth daily. 30 tablet 3   lactase (  LACTAID) 3000 units tablet Take 3,000 Units by mouth daily.     loratadine (CLARITIN) 10 MG tablet Take 10 mg by mouth daily as needed for allergies.     mirtazapine (REMERON) 15 MG tablet TAKE 1 TABLET BY MOUTH ONCE DAILY AT BEDTIME 30 tablet 0   Misc Natural Products (OSTEO BI-FLEX JOINT SHIELD PO) Take 2 tablets by mouth daily.     oxycodone-acetaminophen (LYNOX) 10-300 MG tablet Take 1 tablet by mouth every 4 (four) hours as needed for pain.     Polyethyl Glycol-Propyl Glycol (SYSTANE OP) Place 1 drop into both eyes at bedtime as needed (for dry eyes.).     RESTASIS 0.05 % ophthalmic emulsion 1 drop 2 (two) times daily.      Simethicone (GAS-X PO) Take 1 tablet by mouth 2 (two) times daily as needed (for gas).      Tiotropium Bromide-Olodaterol (STIOLTO RESPIMAT) 2.5-2.5 MCG/ACT AERS Inhale 2 puffs into the lungs daily. 4 g 6   vitamin B-12 (CYANOCOBALAMIN) 1000 MCG tablet Take 1 tablet by mouth daily.     diclofenac (VOLTAREN) 75 MG EC tablet Take 75 mg by mouth 2 (two) times daily. (Patient not taking: Reported on 01/15/2022)     meloxicam (MOBIC) 15 MG tablet Take 15 mg by mouth daily as needed for pain. (Patient not taking: Reported on 01/15/2022)  4   No current facility-administered medications for this visit.    SURGICAL HISTORY:  Past Surgical History:  Procedure Laterality Date   ANAL FISSURE REPAIR  07/09/2011   INTERNAL SPHINCTEROTOMY   BENIGN EXCISION LEFT BREAST CENTRAL DUCT  02/1999   BREAST BIOPSY  01/31/2009   benign   BREAST EXCISIONAL BIOPSY Left 2004   benign   CHEST TUBE INSERTION Right 07/14/2014   Procedure: INSERTION PLEURAL DRAINAGE CATHETER RIGHT CHEST;  Surgeon: Grace Isaac, MD;  Location: [facility];  Service: Thoracic;  Laterality: Right;   EXCISION RIGHT WRIST MASS  2012   EXTRACORPOREAL SHOCK WAVE LITHOTRIPSY Left 06/01/2019   Procedure: EXTRACORPOREAL SHOCK WAVE LITHOTRIPSY (ESWL);  Surgeon: Cleon Gustin, MD;  Location: [facility];  Service: Urology;  Laterality: Left;   KNEE ARTHROSCOPY Right 1999   MASS EXCISION Right 03/11/2014   Procedure: RIGHT WRIST DEEP MASS EXCISION WITH CULTURE AND BIOSPY;  Surgeon: Linna Hoff, MD;  Location: [facility] [place];  Service: Orthopedics;  Laterality: Right;   MICROLARYNGOSCOPY W/VOCAL CORD INJECTION Bilateral 02/08/2021   Procedure: MICROLARYNGOSCOPY WITH VOCAL CORD INJECTION;  Surgeon: Melida Quitter, MD;  Location: [facility];  Service: ENT;  Laterality: Bilateral;   REMOVAL OF PLEURAL DRAINAGE CATHETER Right 10/14/2014   Procedure: REMOVAL OF PLEURAL DRAINAGE CATHETER;  Surgeon: Grace Isaac, MD;  Location: [facility];  Service:  Thoracic;  Laterality: Right;   TALC PLEURODESIS Right 09/16/2014   Procedure: TALC PLEURADESIS/ slurry;  Surgeon: Grace Isaac, MD;  Location: [facility];  Service: Thoracic;  Laterality: Right;   THORACOSCOPY  01-26-2009   w/   lung/  node biopsy's   THUMB ARTHROSCOPY Left    - removed bone spur   TONSILLECTOMY  AS CHILD   TOTAL ABDOMINAL HYSTERECTOMY W/ BILATERAL SALPINGOOPHORECTOMY  1982   W/  APPENDECTOMY   TRANSTHORACIC ECHOCARDIOGRAM  12-23-2008   NORMAL LV/  EF 65-70%/  MILD MR  &  TR   VAULT SUSPENSION PLUS CYSTOCELE REPAIR WITH GRAFT  06-13-2010    REVIEW OF SYSTEMS:  A comprehensive review of systems was negative except for: Constitutional: positive for  fatigue   PHYSICAL EXAMINATION: General appearance: alert, cooperative, fatigued, and no distress Head: Normocephalic, without obvious abnormality, atraumatic Neck: no adenopathy, no JVD, supple, symmetrical, trachea midline, and thyroid not enlarged, symmetric, no tenderness/mass/nodules Lymph nodes: Cervical, supraclavicular, and axillary nodes normal. Resp: clear to auscultation bilaterally Back: symmetric, no curvature. ROM normal. No CVA tenderness. Cardio: regular rate and rhythm, S1, S2 normal, no murmur, click, rub or gallop GI: soft, non-tender; bowel sounds normal; no masses,  no organomegaly Extremities: extremities normal, atraumatic, no cyanosis or edema  ECOG PERFORMANCE STATUS: 1 - Symptomatic but completely ambulatory  Blood pressure (!) 167/100, pulse 95, temperature (!) 96.2 F (35.7 C), temperature source Tympanic, resp. rate 18, height $RemoveBe'5\' 4"'[id number]$  (1.626 m), weight 107 lb 11.2 oz (48.9 kg), SpO2 99 %.  LABORATORY DATA: Lab Results  Component Value Date   WBC 6.8 01/15/2022   HGB 13.1 01/15/2022   HCT 39.1 01/15/2022   MCV 94.2 01/15/2022   PLT 264 01/15/2022      Chemistry      Component Value Date/Time   NA 145 11/14/2021 1118   NA 142 12/10/2017 1000   K 3.9 11/14/2021 1118   K 4.0 12/10/2017  1000   CL 109 11/14/2021 1118   CL 108 (H) 06/03/2013 0939   CO2 29 11/14/2021 1118   CO2 28 12/10/2017 1000   BUN 29 (H) 11/14/2021 1118   BUN 20.4 12/10/2017 1000   CREATININE 0.85 11/14/2021 1118   CREATININE 0.9 12/10/2017 1000      Component Value Date/Time   CALCIUM 9.2 11/14/2021 1118   CALCIUM 9.7 12/10/2017 1000   ALKPHOS 44 11/14/2021 1118   ALKPHOS 63 12/10/2017 1000   AST 36 11/14/2021 1118   AST 25 12/10/2017 1000   ALT 34 11/14/2021 1118   ALT 17 12/10/2017 1000   BILITOT 1.0 11/14/2021 1118   BILITOT 1.05 12/10/2017 1000       RADIOGRAPHIC STUDIES: DG BONE DENSITY (DXA)  Result Date: 01/04/2022 EXAM: DUAL X-RAY ABSORPTIOMETRY (DXA) FOR BONE MINERAL DENSITY IMPRESSION: Referring Physician:  [facility] [name] Your patient completed a bone mineral density test using GE Lunar iDXA system (analysis version: 16). Technologist: [place] PATIENT: Name: Dana [name], Dana [name] Patient ID: [id number] Birth Date: 04/02/1935 Height: 64.0 in. Sex: Female Measured: 01/04/2022 Weight: 108.0 lbs. Indications: Advanced Age, Bilateral Ovariectomy (65.51), Caucasian, COPD, Estrogen Deficient, Hysterectomy, Lung Cancer, Postmenopausal, Remeron Fractures: Right Ankle Treatments: Calcium (E943.0), Prolia, Vitamin D (E933.5) ASSESSMENT: The BMD measured at Femur Neck Right is 0.616 g/cm2 with a T-score of -3.0. This patient is considered osteoporotic according to [facility]) criteria. The quality of the exam is good. The lumbar spine was excluded due to being excluded on prior exam. Site Region Measured Date Measured Age YA BMD Significant CHANGE T-score DualFemur Neck Right 01/04/2024 84.9 -5.0 0.616 g/cm2 DualFemur Neck Right 08/06/2019 82.5 -3.2 0.594 g/cm2 * DualFemur Total Mean 01/04/2022 84.9 -2.1 0.741 g/cm2 DualFemur Total Mean 08/06/2019 82.5 -2.2 0.734 g/cm2 Left Forearm Radius 33% 01/04/2022 84.9 -2.7 0.641 g/cm2 Left Forearm Radius 33% 08/06/2019 82.5 -3.1 0.616 g/cm2 * World Health  Organ[facility]) criteria for post-menopausal, Caucasian Women: Normal       T-score at or above -1 SD Osteopenia   T-score between -1 and -2.5 SD Osteoporosis T-score at or below -2.5 SD RECOMMENDATION: 1. All patients should optimize calcium and vitamin D intake. 2. Consider FDA-approved medical therapies in postmenopausal women and men aged 67 years and older, based on the following: a.  A hip or vertebral (clinical or morphometric) fracture. b. T-score = -2.5 at the femoral neck or spine after appropriate evaluation to exclude secondary causes. c. Low bone mass (T-score between -1.0 and -2.5 at the femoral neck or spine) and a 10-year probability of a hip fracture = 3% or a 10-year probability of a major osteoporosis-related fracture = 20% based on the [place]-adapted WHO algorithm. d. Clinician judgment and/or patient preferences may indicate treatment for people with 10-year fracture probabilities above or below these levels. FOLLOW-UP: Patients with diagnosis of osteoporosis or at high risk for fracture should have regular bone mineral density tests.? Patients eligible for Medicare are allowed routine testing every 2 years.? The testing frequency can be increased to one year for patients who have rapidly progressing disease, are receiving or discontinuing medical therapy to restore bone mass, or have additional risk factors. I have reviewed this study and agree with the findings. Mark A. [place] Papas, M.D. [facility] Electronically Signed   By: Lavonia Dana M.D.   On: 01/04/2022 13:53   MM 3D SCREEN BREAST BILATERAL  Result Date: 01/05/2022 CLINICAL DATA:  Screening. EXAM: DIGITAL SCREENING BILATERAL MAMMOGRAM WITH TOMOSYNTHESIS AND CAD TECHNIQUE: Bilateral screening digital craniocaudal and mediolateral oblique mammograms were obtained. Bilateral screening digital breast tomosynthesis was performed. The images were evaluated with computer-aided detection. COMPARISON:  Previous exam(s). ACR Breast  Density Category c: The breast tissue is heterogeneously dense, which may obscure small masses. FINDINGS: There are no findings suspicious for malignancy. IMPRESSION: No mammographic evidence of malignancy. A result letter of this screening mammogram will be mailed directly to the patient. RECOMMENDATION: Screening mammogram in one year. (Code:SM-B-01Y) BI-RADS CATEGORY  1: Negative. Electronically Signed   By: Margarette Canada M.D.   On: 01/05/2022 07:44    ASSESSMENT AND PLAN:  This is a very pleasant 87 years old white female with stage IIIB non-small cell lung cancer, adenocarcinoma with positive EGFR mutation diagnosed in January 2010. She has been on treatment with Tarceva for more than 9 years.   She is currently on Tarceva 100 mg p.o. daily status post 106  months. The patient continues to tolerate her treatment with Tarceva fairly well with no concerning adverse effects.  Her blood work is unremarkable today I recommended for her to continue her treatment as planned. The patient will continue on the oral iron tablets for the anemia.  Her hemoglobin and hematocrit has significantly improved since starting treatment with the oral iron tablets. I will see her back for follow-up visit in 2 months for evaluation with repeat CT scan of the chest for restaging of her disease. For the hypertension she was advised to take her blood pressure medication as prescribed and monitor it closely at home. Regarding her history of depression and feeling drowsy with Remeron, we will reduce her dose to 7.5 mg p.o. daily and gradually taper it over the next few months. The patient was advised to call immediately if she has any other concerning symptoms in the interval. The patient voices understanding of current disease status and treatment options and is in agreement with the current care plan. All questions were answered. The patient knows to call the clinic with any problems, questions or concerns. We can certainly  see the patient much sooner if necessary. Disclaimer: This note was dictated with voice recognition software. Similar sounding words can inadvertently be transcribed and may not be corrected upon review.

## 2022-02-20 ENCOUNTER — Other Ambulatory Visit (HOSPITAL_COMMUNITY): Payer: Self-pay

## 2022-02-27 DIAGNOSIS — L280 Lichen simplex chronicus: Secondary | ICD-10-CM | POA: Diagnosis not present

## 2022-02-27 DIAGNOSIS — L98492 Non-pressure chronic ulcer of skin of other sites with fat layer exposed: Secondary | ICD-10-CM | POA: Diagnosis not present

## 2022-03-02 ENCOUNTER — Telehealth: Payer: Self-pay | Admitting: Internal Medicine

## 2022-03-02 NOTE — Telephone Encounter (Signed)
Called patient regarding upcoming March appointments, left a voicemail. ?

## 2022-03-06 DIAGNOSIS — R300 Dysuria: Secondary | ICD-10-CM | POA: Diagnosis not present

## 2022-03-13 DIAGNOSIS — E782 Mixed hyperlipidemia: Secondary | ICD-10-CM | POA: Diagnosis not present

## 2022-03-13 DIAGNOSIS — G8929 Other chronic pain: Secondary | ICD-10-CM | POA: Diagnosis not present

## 2022-03-13 DIAGNOSIS — K219 Gastro-esophageal reflux disease without esophagitis: Secondary | ICD-10-CM | POA: Diagnosis not present

## 2022-03-13 DIAGNOSIS — M810 Age-related osteoporosis without current pathological fracture: Secondary | ICD-10-CM | POA: Diagnosis not present

## 2022-03-15 ENCOUNTER — Other Ambulatory Visit: Payer: Self-pay

## 2022-03-15 ENCOUNTER — Ambulatory Visit (HOSPITAL_COMMUNITY)
Admission: RE | Admit: 2022-03-15 | Discharge: 2022-03-15 | Disposition: A | Discharge status: Home / Self Care | Payer: Medicare Other | Source: Ambulatory Visit | Attending: Internal Medicine | Admitting: Internal Medicine

## 2022-03-15 ENCOUNTER — Inpatient Hospital Stay: Payer: Medicare Other | Attending: Internal Medicine

## 2022-03-15 DIAGNOSIS — C3490 Malignant neoplasm of unspecified part of unspecified bronchus or lung: Secondary | ICD-10-CM | POA: Diagnosis not present

## 2022-03-15 DIAGNOSIS — R911 Solitary pulmonary nodule: Secondary | ICD-10-CM | POA: Diagnosis not present

## 2022-03-15 DIAGNOSIS — I10 Essential (primary) hypertension: Secondary | ICD-10-CM | POA: Insufficient documentation

## 2022-03-15 DIAGNOSIS — K219 Gastro-esophageal reflux disease without esophagitis: Secondary | ICD-10-CM | POA: Insufficient documentation

## 2022-03-15 DIAGNOSIS — J449 Chronic obstructive pulmonary disease, unspecified: Secondary | ICD-10-CM | POA: Insufficient documentation

## 2022-03-15 DIAGNOSIS — Z87442 Personal history of urinary calculi: Secondary | ICD-10-CM | POA: Insufficient documentation

## 2022-03-15 DIAGNOSIS — Z801 Family history of malignant neoplasm of trachea, bronchus and lung: Secondary | ICD-10-CM | POA: Insufficient documentation

## 2022-03-15 DIAGNOSIS — C342 Malignant neoplasm of middle lobe, bronchus or lung: Secondary | ICD-10-CM | POA: Insufficient documentation

## 2022-03-15 DIAGNOSIS — M810 Age-related osteoporosis without current pathological fracture: Secondary | ICD-10-CM | POA: Insufficient documentation

## 2022-03-15 DIAGNOSIS — Z79899 Other long term (current) drug therapy: Secondary | ICD-10-CM | POA: Insufficient documentation

## 2022-03-15 DIAGNOSIS — J90 Pleural effusion, not elsewhere classified: Secondary | ICD-10-CM | POA: Insufficient documentation

## 2022-03-15 DIAGNOSIS — Z791 Long term (current) use of non-steroidal anti-inflammatories (NSAID): Secondary | ICD-10-CM | POA: Insufficient documentation

## 2022-03-15 LAB — CBC WITH DIFFERENTIAL (CANCER CENTER ONLY)
Abs Immature Granulocytes: 0.01 10*3/uL (ref 0.00–0.07)
Basophils Absolute: 0 10*3/uL (ref 0.0–0.1)
Basophils Relative: 0 %
Eosinophils Absolute: 0.2 10*3/uL (ref 0.0–0.5)
Eosinophils Relative: 4 %
HCT: 42.4 % (ref 36.0–46.0)
Hemoglobin: 14.7 g/dL (ref 12.0–15.0)
Immature Granulocytes: 0 %
Lymphocytes Relative: 14 %
Lymphs Abs: 0.8 10*3/uL (ref 0.7–4.0)
MCH: 32.5 pg (ref 26.0–34.0)
MCHC: 34.7 g/dL (ref 30.0–36.0)
MCV: 93.8 fL (ref 80.0–100.0)
Monocytes Absolute: 0.5 10*3/uL (ref 0.1–1.0)
Monocytes Relative: 8 %
Neutro Abs: 4.4 10*3/uL (ref 1.7–7.7)
Neutrophils Relative %: 74 %
Platelet Count: 310 10*3/uL (ref 150–400)
RBC: 4.52 MIL/uL (ref 3.87–5.11)
RDW: 13.3 % (ref 11.5–15.5)
WBC Count: 5.9 10*3/uL (ref 4.0–10.5)
nRBC: 0 % (ref 0.0–0.2)

## 2022-03-15 LAB — CMP (CANCER CENTER ONLY)
ALT: 29 U/L (ref 0–44)
AST: 31 U/L (ref 15–41)
Albumin: 3.8 g/dL (ref 3.5–5.0)
Alkaline Phosphatase: 47 U/L (ref 38–126)
Anion gap: 8 (ref 5–15)
BUN: 31 mg/dL — ABNORMAL HIGH (ref 8–23)
CO2: 28 mmol/L (ref 22–32)
Calcium: 9.6 mg/dL (ref 8.9–10.3)
Chloride: 106 mmol/L (ref 98–111)
Creatinine: 0.95 mg/dL (ref 0.44–1.00)
GFR, Estimated: 59 mL/min — ABNORMAL LOW (ref >60)
Glucose, Bld: 85 mg/dL (ref 70–99)
Potassium: 4.3 mmol/L (ref 3.5–5.1)
Sodium: 142 mmol/L (ref 135–145)
Total Bilirubin: 0.9 mg/dL (ref 0.3–1.2)
Total Protein: 6.4 g/dL — ABNORMAL LOW (ref 6.5–8.1)

## 2022-03-15 MED ORDER — SODIUM CHLORIDE (PF) 0.9 % IJ SOLN
INTRAMUSCULAR | Status: AC
  Filled 2022-03-15: qty 50

## 2022-03-15 MED ORDER — IOHEXOL 300 MG/ML  SOLN
75.0000 mL | Freq: Once | INTRAMUSCULAR | Status: AC | PRN
  Administered 2022-03-15: 75 mL via INTRAVENOUS

## 2022-03-19 ENCOUNTER — Inpatient Hospital Stay: Payer: Medicare Other | Admitting: Internal Medicine

## 2022-03-19 ENCOUNTER — Other Ambulatory Visit: Payer: Self-pay

## 2022-03-19 ENCOUNTER — Encounter: Payer: Self-pay | Admitting: Internal Medicine

## 2022-03-19 VITALS — BP 140/80 | HR 94 | Temp 97.2°F | Resp 18 | Ht 64.0 in | Wt 107.1 lb

## 2022-03-19 DIAGNOSIS — C342 Malignant neoplasm of middle lobe, bronchus or lung: Secondary | ICD-10-CM | POA: Diagnosis not present

## 2022-03-19 DIAGNOSIS — Z791 Long term (current) use of non-steroidal anti-inflammatories (NSAID): Secondary | ICD-10-CM | POA: Diagnosis not present

## 2022-03-19 DIAGNOSIS — I10 Essential (primary) hypertension: Secondary | ICD-10-CM | POA: Diagnosis not present

## 2022-03-19 DIAGNOSIS — J90 Pleural effusion, not elsewhere classified: Secondary | ICD-10-CM | POA: Diagnosis not present

## 2022-03-19 DIAGNOSIS — Z87442 Personal history of urinary calculi: Secondary | ICD-10-CM | POA: Diagnosis not present

## 2022-03-19 DIAGNOSIS — Z79899 Other long term (current) drug therapy: Secondary | ICD-10-CM | POA: Diagnosis not present

## 2022-03-19 DIAGNOSIS — M810 Age-related osteoporosis without current pathological fracture: Secondary | ICD-10-CM | POA: Diagnosis not present

## 2022-03-19 DIAGNOSIS — K219 Gastro-esophageal reflux disease without esophagitis: Secondary | ICD-10-CM | POA: Diagnosis not present

## 2022-03-19 DIAGNOSIS — Z5181 Encounter for therapeutic drug level monitoring: Secondary | ICD-10-CM | POA: Diagnosis not present

## 2022-03-19 DIAGNOSIS — C3491 Malignant neoplasm of unspecified part of right bronchus or lung: Secondary | ICD-10-CM | POA: Diagnosis not present

## 2022-03-19 DIAGNOSIS — Z801 Family history of malignant neoplasm of trachea, bronchus and lung: Secondary | ICD-10-CM | POA: Diagnosis not present

## 2022-03-19 DIAGNOSIS — J449 Chronic obstructive pulmonary disease, unspecified: Secondary | ICD-10-CM | POA: Diagnosis not present

## 2022-03-19 NOTE — Progress Notes (Signed)
?    [facility] ?Telephone:(336) [phone]   [phone] ? ?OFFICE PROGRESS NOTE ? ?[name], MD ?[address] ?[place] [address] ? ?DIAGNOSIS: Stage IIIB (T2a, N3, M0) non-small cell lung cancer, adenocarcinoma with positive EGFR mutation diagnosed in January 2010. ? ?PRIOR THERAPY: ?1) Status post concurrent chemoradiation with weekly carboplatin and paclitaxel; last dose given March 21, 2009.  ?2) Tarceva at 150 mg p.o. daily, status post approximately 48 months of treatment, discontinued secondary to persistent diarrhea.  ?3) status post right Pleurx catheter placement for right nonmalignant pleural effusion. ? ?CURRENT THERAPY: Tarceva 100 mg by mouth daily started 03/19/2013, status post 108  months of treatment. ? ?INTERVAL HISTORY: ?Dana [name] 87 y.o. female returns to the clinic today for follow-up visit.  The patient is feeling fine with no concerning complaints except for lack of sleep the last few months.  She is currently on Remeron but its not helping her sleep.  She continues to tolerate her treatment with Tarceva fairly well.  She denied having any skin rash or diarrhea.  She has no chest pain, shortness of breath, cough or hemoptysis.  She has no nausea, vomiting, diarrhea or constipation.  She continues to complain of fatigue and not feeling well with some occasional balance issue and she is using a walker.  She walks around 1 mile every day when the weather is good.  She had repeat CT scan of the chest performed recently and she is here for evaluation and discussion of her scan results. ? ?MEDICAL HISTORY: ?Past Medical History:  ?Diagnosis Date  ? Arthritis   ? HANDS,  WRISTS  ? COPD (chronic obstructive pulmonary disease) ([place])   ? Depression 04/04/2017  ? Dyspnea on exertion   ? Encounter for therapeutic drug monitoring 10/01/2016  ? GERD (gastroesophageal reflux disease)   ? at times  ? Hemorrhoid   ? History of anal fissures   ? History of kidney stones    ? History of lung cancer ONCOLOGIST--  [facility]--  LAST CT ,  NO RECURRENCE OR METS  ? DX JAN 2010 --  STAGE IIIA  NON-SMALL CELL ADENOCARCINOMA (RIGHT MIDDLE LOBE)---  S/P  CHEMORADIATIO THERAPY  (COMPLETE 03-21-2009)  ? HTN (hypertension) 10/10/2017  ? white coat syndrome  ? Imbalance 06/04/2017  ? lung ca dx'd 2010  ? Normal cardiac stress test   ? 2007  PER PT  ? Osteoporosis   ? Rash, skin   ? RIGHT FOREARM/ HAND  ? Right wrist pain   ? MASS  ? Wears glasses   ? ? ?ALLERGIES:  is allergic to methotrexate, clindamycin hcl, amoxicillin, and protonix [pantoprazole sodium]. ? ?MEDICATIONS:  ?Current Outpatient Medications  ?Medication Sig Dispense Refill  ? antiseptic oral rinse (BIOTENE) LIQD 15 mLs by Mouth Rinse route as needed for dry mouth.    ? b complex vitamins capsule Take 1 capsule by mouth daily.    ? bismuth subsalicylate (PEPTO BISMOL) 262 MG/15ML suspension Take 30 mLs by mouth every three (3) days as needed.    ? Calcium-Vitamin D (CVS CALCIUM-600/VIT D PO) Take 2 tablets by mouth daily.    ? cholecalciferol (VITAMIN D) 1000 UNITS tablet Take 2,000 Units by mouth daily.    ? diclofenac (VOLTAREN) 75 MG EC tablet Take 75 mg by mouth 2 (two) times daily. (Patient not taking: Reported on 01/15/2022)    ? diclofenac Sodium (VOLTAREN) 1 % GEL Apply 2 g topically 4 (four) times  daily as needed (pain).    ? doxycycline (ADOXA) 100 MG tablet Take 100 mg by mouth 2 (two) times daily.    ? erlotinib (TARCEVA) 100 MG tablet Take 1 tablet (100 mg total) by mouth daily. 30 tablet 3  ? lactase (LACTAID) 3000 units tablet Take 3,000 Units by mouth daily.    ? loratadine (CLARITIN) 10 MG tablet Take 10 mg by mouth daily as needed for allergies.    ? meloxicam (MOBIC) 15 MG tablet Take 15 mg by mouth daily as needed for pain. (Patient not taking: Reported on 01/15/2022)  4  ? mirtazapine (REMERON) 15 MG tablet TAKE 1 TABLET BY MOUTH ONCE DAILY AT BEDTIME 30 tablet 0  ? Misc Natural Products (OSTEO BI-FLEX JOINT  SHIELD PO) Take 2 tablets by mouth daily.    ? oxycodone-acetaminophen (LYNOX) 10-300 MG tablet Take 1 tablet by mouth every 4 (four) hours as needed for pain.    ? Polyethyl Glycol-Propyl Glycol (SYSTANE OP) Place 1 drop into both eyes at bedtime as needed (for dry eyes.).    ? RESTASIS 0.05 % ophthalmic emulsion 1 drop 2 (two) times daily.    ? Simethicone (GAS-X PO) Take 1 tablet by mouth 2 (two) times daily as needed (for gas).     ? Tiotropium Bromide-Olodaterol (STIOLTO RESPIMAT) 2.5-2.5 MCG/ACT AERS Inhale 2 puffs into the lungs daily. 4 g 6  ? vitamin B-12 (CYANOCOBALAMIN) 1000 MCG tablet Take 1 tablet by mouth daily.    ? ?No current facility-administered medications for this visit.  ? ? ?SURGICAL HISTORY:  ?Past Surgical History:  ?Procedure Laterality Date  ? ANAL FISSURE REPAIR  07/09/2011  ? INTERNAL SPHINCTEROTOMY  ? BENIGN EXCISION LEFT BREAST CENTRAL DUCT  02/1999  ? BREAST BIOPSY  01/31/2009  ? benign  ? BREAST EXCISIONAL BIOPSY Left 2004  ? benign  ? CHEST TUBE INSERTION Right 07/14/2014  ? Procedure: INSERTION PLEURAL DRAINAGE CATHETER RIGHT CHEST;  Surgeon: Grace Isaac, MD;  Location: [place];  Service: Thoracic;  Laterality: Right;  ? EXCISION RIGHT WRIST MASS  2012  ? EXTRACORPOREAL SHOCK WAVE LITHOTRIPSY Left 06/01/2019  ? Procedure: EXTRACORPOREAL SHOCK WAVE LITHOTRIPSY (ESWL);  Surgeon: Cleon Gustin, MD;  [facility];  Service: Urology;  Laterality: Left;  ? KNEE ARTHROSCOPY Right 1999  ? MASS EXCISION Right 03/11/2014  ? Procedure: RIGHT WRIST DEEP MASS EXCISION WITH CULTURE AND BIOSPY;  Surgeon: Linna Hoff, MD;  Location: [place];  Service: Orthopedics;  Laterality: Right;  ? MICROLARYNGOSCOPY W/VOCAL CORD INJECTION Bilateral 02/08/2021  ? Procedure: MICROLARYNGOSCOPY WITH VOCAL CORD INJECTION;  Surgeon: Melida Quitter, MD;  Location: [place];  Service: ENT;  Laterality: Bilateral;  ? REMOVAL OF PLEURAL DRAINAGE CATHETER Right 10/14/2014  ? Procedure: REMOVAL  OF PLEURAL DRAINAGE CATHETER;  Surgeon: Grace Isaac, MD;  Location: [place];  Service: Thoracic;  Laterality: Right;  ? TALC PLEURODESIS Right 09/16/2014  ? Procedure: TALC PLEURADESIS/ slurry;  Surgeon: Grace Isaac, MD;  Location: [place];  Service: Thoracic;  Laterality: Right;  ? THORACOSCOPY  01-26-2009  ? w/   lung/  node biopsy's  ? THUMB ARTHROSCOPY Left   ? - removed bone spur  ? TONSILLECTOMY  AS CHILD  ? TOTAL ABDOMINAL HYSTERECTOMY W/ BILATERAL SALPINGOOPHORECTOMY  1982  ? W/  APPENDECTOMY  ? TRANSTHORACIC ECHOCARDIOGRAM  12-23-2008  ? NORMAL LV/  EF 65-70%/  MILD MR  &  TR  ? VAULT SUSPENSION PLUS CYSTOCELE REPAIR WITH GRAFT  06-13-2010  ? ? ?REVIEW OF SYSTEMS:  Constitutional: positive for fatigue ?Eyes: negative ?Ears, nose, mouth, throat, and face: negative ?Respiratory: negative ?Cardiovascular: negative ?Gastrointestinal: negative ?Genitourinary:negative ?Integument/breast: negative ?Hematologic/lymphatic: negative ?Musculoskeletal:positive for muscle weakness ?Neurological: positive for weakness ?Behavioral/Psych: positive for sleep disturbance ?Endocrine: negative ?Allergic/Immunologic: negative  ? ?PHYSICAL EXAMINATION: General appearance: alert, cooperative, fatigued, and no distress ?Head: Normocephalic, without obvious abnormality, atraumatic ?Neck: no adenopathy, no JVD, supple, symmetrical, trachea midline, and thyroid not enlarged, symmetric, no tenderness/mass/nodules ?Lymph nodes: Cervical, supraclavicular, and axillary nodes normal. ?Resp: clear to auscultation bilaterally ?Back: symmetric, no curvature. ROM normal. No CVA tenderness. ?Cardio: regular rate and rhythm, S1, S2 normal, no murmur, click, rub or gallop ?GI: soft, non-tender; bowel sounds normal; no masses,  no organomegaly ?Extremities: extremities normal, atraumatic, no cyanosis or edema ?Neurologic: Alert and oriented X 3, normal strength and tone. Normal symmetric reflexes. Normal coordination and gait ? ?ECOG  PERFORMANCE STATUS: 1 - Symptomatic but completely ambulatory ? ?Blood pressure 140/80, pulse 94, temperature (!) 97.2 ?F (36.2 ?C), temperature source Tympanic, resp. rate 18, height $RemoveBe'5\' 4"'[id number]$  (1.626 m), weight 1

## 2022-03-20 DIAGNOSIS — H02052 Trichiasis without entropian right lower eyelid: Secondary | ICD-10-CM | POA: Diagnosis not present

## 2022-03-20 DIAGNOSIS — Z79899 Other long term (current) drug therapy: Secondary | ICD-10-CM | POA: Diagnosis not present

## 2022-03-20 DIAGNOSIS — H2513 Age-related nuclear cataract, bilateral: Secondary | ICD-10-CM | POA: Diagnosis not present

## 2022-03-20 DIAGNOSIS — H5203 Hypermetropia, bilateral: Secondary | ICD-10-CM | POA: Diagnosis not present

## 2022-03-20 DIAGNOSIS — H524 Presbyopia: Secondary | ICD-10-CM | POA: Diagnosis not present

## 2022-03-21 DIAGNOSIS — Z789 Other specified health status: Secondary | ICD-10-CM | POA: Diagnosis not present

## 2022-03-21 DIAGNOSIS — L98492 Non-pressure chronic ulcer of skin of other sites with fat layer exposed: Secondary | ICD-10-CM | POA: Diagnosis not present

## 2022-03-21 DIAGNOSIS — M25641 Stiffness of right hand, not elsewhere classified: Secondary | ICD-10-CM | POA: Diagnosis not present

## 2022-04-09 DIAGNOSIS — Z8619 Personal history of other infectious and parasitic diseases: Secondary | ICD-10-CM | POA: Diagnosis not present

## 2022-04-09 DIAGNOSIS — L089 Local infection of the skin and subcutaneous tissue, unspecified: Secondary | ICD-10-CM | POA: Diagnosis not present

## 2022-04-09 DIAGNOSIS — T148XXA Other injury of unspecified body region, initial encounter: Secondary | ICD-10-CM | POA: Diagnosis not present

## 2022-04-09 DIAGNOSIS — L98492 Non-pressure chronic ulcer of skin of other sites with fat layer exposed: Secondary | ICD-10-CM | POA: Diagnosis not present

## 2022-04-11 ENCOUNTER — Telehealth: Payer: Self-pay | Admitting: Internal Medicine

## 2022-04-11 NOTE — Telephone Encounter (Signed)
Called patient regarding upcoming appointments, left a voicemail. 

## 2022-04-23 ENCOUNTER — Ambulatory Visit: Payer: Medicare Other

## 2022-04-25 ENCOUNTER — Telehealth: Payer: Self-pay | Admitting: Sports Medicine

## 2022-04-25 DIAGNOSIS — L821 Other seborrheic keratosis: Secondary | ICD-10-CM | POA: Diagnosis not present

## 2022-04-25 DIAGNOSIS — L98492 Non-pressure chronic ulcer of skin of other sites with fat layer exposed: Secondary | ICD-10-CM | POA: Diagnosis not present

## 2022-04-25 DIAGNOSIS — L570 Actinic keratosis: Secondary | ICD-10-CM | POA: Diagnosis not present

## 2022-04-25 NOTE — Telephone Encounter (Signed)
Patient is scheduled to see Dr Jeanmarie Hubert on June 7th. She would like to have another round of Durolane done while she is here for her visit if possible. (Last done 11/28/2021) ?

## 2022-04-30 ENCOUNTER — Ambulatory Visit: Payer: Medicare Other

## 2022-05-07 DIAGNOSIS — L821 Other seborrheic keratosis: Secondary | ICD-10-CM | POA: Diagnosis not present

## 2022-05-07 DIAGNOSIS — L239 Allergic contact dermatitis, unspecified cause: Secondary | ICD-10-CM | POA: Diagnosis not present

## 2022-05-07 DIAGNOSIS — Z85828 Personal history of other malignant neoplasm of skin: Secondary | ICD-10-CM | POA: Diagnosis not present

## 2022-05-09 DIAGNOSIS — L309 Dermatitis, unspecified: Secondary | ICD-10-CM | POA: Diagnosis not present

## 2022-05-09 DIAGNOSIS — M25531 Pain in right wrist: Secondary | ICD-10-CM | POA: Diagnosis not present

## 2022-05-09 DIAGNOSIS — L98492 Non-pressure chronic ulcer of skin of other sites with fat layer exposed: Secondary | ICD-10-CM | POA: Diagnosis not present

## 2022-05-14 ENCOUNTER — Other Ambulatory Visit: Payer: Self-pay | Admitting: Medical Oncology

## 2022-05-14 ENCOUNTER — Ambulatory Visit: Payer: Medicare Other

## 2022-05-14 DIAGNOSIS — C3490 Malignant neoplasm of unspecified part of unspecified bronchus or lung: Secondary | ICD-10-CM

## 2022-05-14 MED ORDER — ERLOTINIB HCL 100 MG PO TABS: 100.0000 mg | ORAL_TABLET | Freq: Every day | ORAL | 3 refills | Status: DC

## 2022-05-16 ENCOUNTER — Other Ambulatory Visit: Payer: Self-pay

## 2022-05-16 ENCOUNTER — Telehealth: Payer: Self-pay | Admitting: Medical Oncology

## 2022-05-16 ENCOUNTER — Inpatient Hospital Stay (HOSPITAL_BASED_OUTPATIENT_CLINIC_OR_DEPARTMENT_OTHER): Payer: Medicare Other | Admitting: Internal Medicine

## 2022-05-16 ENCOUNTER — Inpatient Hospital Stay: Payer: Medicare Other | Attending: Internal Medicine

## 2022-05-16 ENCOUNTER — Encounter: Payer: Self-pay | Admitting: Internal Medicine

## 2022-05-16 VITALS — BP 124/83 | HR 95 | Temp 97.8°F | Resp 16 | Wt 107.6 lb

## 2022-05-16 DIAGNOSIS — Z79899 Other long term (current) drug therapy: Secondary | ICD-10-CM | POA: Diagnosis not present

## 2022-05-16 DIAGNOSIS — Z87442 Personal history of urinary calculi: Secondary | ICD-10-CM | POA: Diagnosis not present

## 2022-05-16 DIAGNOSIS — J449 Chronic obstructive pulmonary disease, unspecified: Secondary | ICD-10-CM | POA: Diagnosis not present

## 2022-05-16 DIAGNOSIS — Z85118 Personal history of other malignant neoplasm of bronchus and lung: Secondary | ICD-10-CM | POA: Diagnosis not present

## 2022-05-16 DIAGNOSIS — R197 Diarrhea, unspecified: Secondary | ICD-10-CM | POA: Diagnosis not present

## 2022-05-16 DIAGNOSIS — C342 Malignant neoplasm of middle lobe, bronchus or lung: Secondary | ICD-10-CM | POA: Insufficient documentation

## 2022-05-16 DIAGNOSIS — K219 Gastro-esophageal reflux disease without esophagitis: Secondary | ICD-10-CM | POA: Diagnosis not present

## 2022-05-16 DIAGNOSIS — I10 Essential (primary) hypertension: Secondary | ICD-10-CM | POA: Insufficient documentation

## 2022-05-16 DIAGNOSIS — G4700 Insomnia, unspecified: Secondary | ICD-10-CM | POA: Diagnosis not present

## 2022-05-16 DIAGNOSIS — C3490 Malignant neoplasm of unspecified part of unspecified bronchus or lung: Secondary | ICD-10-CM | POA: Diagnosis not present

## 2022-05-16 DIAGNOSIS — J90 Pleural effusion, not elsewhere classified: Secondary | ICD-10-CM | POA: Insufficient documentation

## 2022-05-16 DIAGNOSIS — C3491 Malignant neoplasm of unspecified part of right bronchus or lung: Secondary | ICD-10-CM

## 2022-05-16 LAB — CMP (CANCER CENTER ONLY)
ALT: 31 U/L (ref 0–44)
AST: 33 U/L (ref 15–41)
Albumin: 3.6 g/dL (ref 3.5–5.0)
Alkaline Phosphatase: 34 U/L — ABNORMAL LOW (ref 38–126)
Anion gap: 8 (ref 5–15)
BUN: 39 mg/dL — ABNORMAL HIGH (ref 8–23)
CO2: 28 mmol/L (ref 22–32)
Calcium: 9.2 mg/dL (ref 8.9–10.3)
Chloride: 103 mmol/L (ref 98–111)
Creatinine: 1.02 mg/dL — ABNORMAL HIGH (ref 0.44–1.00)
GFR, Estimated: 54 mL/min — ABNORMAL LOW (ref >60)
Glucose, Bld: 95 mg/dL (ref 70–99)
Potassium: 3.8 mmol/L (ref 3.5–5.1)
Sodium: 139 mmol/L (ref 135–145)
Total Bilirubin: 0.9 mg/dL (ref 0.3–1.2)
Total Protein: 6.3 g/dL — ABNORMAL LOW (ref 6.5–8.1)

## 2022-05-16 LAB — CBC WITH DIFFERENTIAL (CANCER CENTER ONLY)
Abs Immature Granulocytes: 0.02 10*3/uL (ref 0.00–0.07)
Basophils Absolute: 0 10*3/uL (ref 0.0–0.1)
Basophils Relative: 0 %
Eosinophils Absolute: 0.2 10*3/uL (ref 0.0–0.5)
Eosinophils Relative: 2 %
HCT: 37.6 % (ref 36.0–46.0)
Hemoglobin: 13.1 g/dL (ref 12.0–15.0)
Immature Granulocytes: 0 %
Lymphocytes Relative: 12 %
Lymphs Abs: 0.8 10*3/uL (ref 0.7–4.0)
MCH: 32.8 pg (ref 26.0–34.0)
MCHC: 34.8 g/dL (ref 30.0–36.0)
MCV: 94.2 fL (ref 80.0–100.0)
Monocytes Absolute: 0.6 10*3/uL (ref 0.1–1.0)
Monocytes Relative: 9 %
Neutro Abs: 5.1 10*3/uL (ref 1.7–7.7)
Neutrophils Relative %: 77 %
Platelet Count: 233 10*3/uL (ref 150–400)
RBC: 3.99 MIL/uL (ref 3.87–5.11)
RDW: 14.6 % (ref 11.5–15.5)
WBC Count: 6.8 10*3/uL (ref 4.0–10.5)
nRBC: 0 % (ref 0.0–0.2)

## 2022-05-16 NOTE — Progress Notes (Signed)
[facility] [phone]   [phone]) [phone]  OFFICE PROGRESS NOTE  [name], MD [place] [address]  DIAGNOSIS: Stage IIIB (T2a, N3, M0) non-small cell lung cancer, adenocarcinoma with positive EGFR mutation diagnosed in January 2010.  PRIOR THERAPY: 1) Status post concurrent chemoradiation with weekly carboplatin and paclitaxel; last dose given March 21, 2009.  2) Tarceva at 150 mg p.o. daily, status post approximately 48 months of treatment, discontinued secondary to persistent diarrhea.  3) status post right Pleurx catheter placement for right nonmalignant pleural effusion.  CURRENT THERAPY: Tarceva 100 mg by mouth daily started 03/19/2013, status post 110  months of treatment.  INTERVAL HISTORY: Dana [name] 87 y.o. female returns to the clinic today for follow-up visit.  The patient is feeling fine today with no concerning complaints except for mild fatigue.  She denied having any current chest pain, shortness of breath, cough or hemoptysis.  She denied having any fever or chills.  She has no nausea, vomiting, diarrhea or constipation.  She has no headache or visual changes.  She has no weight loss or night sweats.  She is tolerating her treatment with Tarceva fairly well.  She is here today for evaluation and repeat blood work.  MEDICAL HISTORY: Past Medical History:  Diagnosis Date   Arthritis    HANDS,  WRISTS   COPD (chronic obstructive pulmonary disease) ([place])    Depression 04/04/2017   Dyspnea on exertion    Encounter for therapeutic drug monitoring 10/01/2016   GERD (gastroesophageal reflux disease)    at times   Hemorrhoid    History of anal fissures    History of kidney stones    History of lung cancer ONCOLOGIST--  DR [name]--  LAST CT ,  NO RECURRENCE OR METS   DX JAN 2010 --  STAGE IIIA  NON-SMALL CELL ADENOCARCINOMA (RIGHT MIDDLE LOBE)---  S/P  CHEMORADIATIO THERAPY  (COMPLETE 03-21-2009)   HTN  (hypertension) 10/10/2017   white coat syndrome   Imbalance 06/04/2017   lung ca dx'd 2010   Normal cardiac stress test    2007  PER PT   Osteoporosis    Rash, skin    RIGHT FOREARM/ HAND   Right wrist pain    MASS   Wears glasses     ALLERGIES:  is allergic to methotrexate, clindamycin hcl, amoxicillin, and protonix [pantoprazole sodium].  MEDICATIONS:  Current Outpatient Medications  Medication Sig Dispense Refill   antiseptic oral rinse (BIOTENE) LIQD 15 mLs by Mouth Rinse route as needed for dry mouth.     b complex vitamins capsule Take 1 capsule by mouth daily.     bismuth subsalicylate (PEPTO BISMOL) 262 MG/15ML suspension Take 30 mLs by mouth every three (3) days as needed.     Calcium-Vitamin D ([employer]S CALCIUM-600/VIT D PO) Take 2 tablets by mouth daily.     cholecalciferol (VITAMIN D) 1000 UNITS tablet Take 2,000 Units by mouth daily.     erlotinib (TARCEVA) 100 MG tablet Take 1 tablet (100 mg total) by mouth daily. 30 tablet 3   lactase (LACTAID) 3000 units tablet Take 3,000 Units by mouth daily.     loratadine (CLARITIN) 10 MG tablet Take 10 mg by mouth daily as needed for allergies.     Misc Natural Products (OSTEO BI-FLEX JOINT SHIELD PO) Take 2 tablets by mouth daily.     Polyethyl Glycol-Propyl Glycol (SYSTANE OP) Place 1 drop into both eyes at bedtime  as needed (for dry eyes.).     Simethicone (GAS-X PO) Take 1 tablet by mouth 2 (two) times daily as needed (for gas).      Tiotropium Bromide-Olodaterol (STIOLTO RESPIMAT) 2.5-2.5 MCG/ACT AERS Inhale 2 puffs into the lungs daily. 4 g 6   vitamin B-12 (CYANOCOBALAMIN) 1000 MCG tablet Take 1 tablet by mouth daily.     No current facility-administered medications for this visit.    SURGICAL HISTORY:  Past Surgical History:  Procedure Laterality Date   ANAL FISSURE REPAIR  07/09/2011   INTERNAL SPHINCTEROTOMY   BENIGN EXCISION LEFT BREAST CENTRAL DUCT  02/1999   BREAST BIOPSY  01/31/2009   benign   BREAST EXCISIONAL  BIOPSY Left 2004   benign   CHEST TUBE INSERTION Right 07/14/2014   Procedure: INSERTION PLEURAL DRAINAGE CATHETER RIGHT CHEST;  Surgeon: Grace Isaac, MD;  Location: [facility];  Service: Thoracic;  Laterality: Right;   EXCISION RIGHT WRIST MASS  2012   EXTRACORPOREAL SHOCK WAVE LITHOTRIPSY Left 06/01/2019   Procedure: EXTRACORPOREAL SHOCK WAVE LITHOTRIPSY (ESWL);  Surgeon: Cleon Gustin, MD;  Location: [facility];  Service: Urology;  Laterality: Left;   KNEE ARTHROSCOPY Right 1999   MASS EXCISION Right 03/11/2014   Procedure: RIGHT WRIST DEEP MASS EXCISION WITH CULTURE AND BIOSPY;  Surgeon: Linna Hoff, MD;  Location: [facility];  Service: Orthopedics;  Laterality: Right;   MICROLARYNGOSCOPY W/VOCAL CORD INJECTION Bilateral 02/08/2021   Procedure: MICROLARYNGOSCOPY WITH VOCAL CORD INJECTION;  Surgeon: Melida Quitter, MD;  Location: [facility];  Service: ENT;  Laterality: Bilateral;   REMOVAL OF PLEURAL DRAINAGE CATHETER Right 10/14/2014   Procedure: REMOVAL OF PLEURAL DRAINAGE CATHETER;  Surgeon: Grace Isaac, MD;  Location: [facility];  Service: Thoracic;  Laterality: Right;   TALC PLEURODESIS Right 09/16/2014   Procedure: TALC PLEURADESIS/ slurry;  Surgeon: Grace Isaac, MD;  Location: [facility];  Service: Thoracic;  Laterality: Right;   THORACOSCOPY  01-26-2009   w/   lung/  node biopsy's   THUMB ARTHROSCOPY Left    - removed bone spur   TONSILLECTOMY  AS CHILD   TOTAL ABDOMINAL HYSTERECTOMY W/ BILATERAL SALPINGOOPHORECTOMY  1982   W/  APPENDECTOMY   TRANSTHORACIC ECHOCARDIOGRAM  12-23-2008   NORMAL LV/  EF 65-70%/  MILD MR  &  TR   VAULT SUSPENSION PLUS CYSTOCELE REPAIR WITH GRAFT  06-13-2010    REVIEW OF SYSTEMS:  A comprehensive review of systems was negative except for: Constitutional: positive for fatigue   PHYSICAL EXAMINATION: General appearance: alert, cooperative, fatigued, and no distress Head: Normocephalic, without obvious abnormality, atraumatic Neck: no  adenopathy, no JVD, supple, symmetrical, trachea midline, and thyroid not enlarged, symmetric, no tenderness/mass/nodules Lymph nodes: Cervical, supraclavicular, and axillary nodes normal. Resp: clear to auscultation bilaterally Back: symmetric, no curvature. ROM normal. No CVA tenderness. Cardio: regular rate and rhythm, S1, S2 normal, no murmur, click, rub or gallop GI: soft, non-tender; bowel sounds normal; no masses,  no organomegaly Extremities: extremities normal, atraumatic, no cyanosis or edema  ECOG PERFORMANCE STATUS: 1 - Symptomatic but completely ambulatory  Blood pressure 124/83, pulse 95, temperature 97.8 F (36.6 C), temperature source Tympanic, resp. rate 16, weight 107 lb 9 oz (48.8 kg), SpO2 97 %.  LABORATORY DATA: Lab Results  Component Value Date   WBC 6.8 05/16/2022   HGB 13.1 05/16/2022   HCT 37.6 05/16/2022   MCV 94.2 05/16/2022   PLT 233 05/16/2022      Chemistry  Component Value Date/Time   NA 142 03/15/2022 0901   NA 142 12/10/2017 1000   K 4.3 03/15/2022 0901   K 4.0 12/10/2017 1000   CL 106 03/15/2022 0901   CL 108 (H) 06/03/2013 0939   CO2 28 03/15/2022 0901   CO2 28 12/10/2017 1000   BUN 31 (H) 03/15/2022 0901   BUN 20.4 12/10/2017 1000   CREATININE 0.95 03/15/2022 0901   CREATININE 0.9 12/10/2017 1000      Component Value Date/Time   CALCIUM 9.6 03/15/2022 0901   CALCIUM 9.7 12/10/2017 1000   ALKPHOS 47 03/15/2022 0901   ALKPHOS 63 12/10/2017 1000   AST 31 03/15/2022 0901   AST 25 12/10/2017 1000   ALT 29 03/15/2022 0901   ALT 17 12/10/2017 1000   BILITOT 0.9 03/15/2022 0901   BILITOT 1.05 12/10/2017 1000       RADIOGRAPHIC STUDIES: No results found.  ASSESSMENT AND PLAN:  This is a very pleasant 87 years old white female with stage IIIB non-small cell lung cancer, adenocarcinoma with positive EGFR mutation diagnosed in January 2010. She has been on treatment with Tarceva for more than 9 years.   She is currently on  Tarceva 100 mg p.o. daily status post 110  months. The patient has been tolerating her treatment fairly well with no concerning adverse effects. I recommended for her to continue her current treatment with Tarceva with the same dose. I will see her back for follow-up visit in 2 months for evaluation and repeat CT scan of the chest for restaging of her disease. For the insomnia and depression, she is currently on Remeron but her dose was reduced to 7.5 mg p.o. daily. The patient was advised to call immediately if she has any other concerning symptoms in the interval. The patient voices understanding of current disease status and treatment options and is in agreement with the current care plan. All questions were answered. The patient knows to call the clinic with any problems, questions or concerns. We can certainly see the patient much sooner if necessary. Disclaimer: This note was dictated with voice recognition software. Similar sounding words can inadvertently be transcribed and may not be corrected upon review.

## 2022-05-16 NOTE — Telephone Encounter (Signed)
LVM with new rx for Tarceva instructions that they received on may 22.

## 2022-05-22 IMAGING — CT CT CHEST W/ CM
2 of 4 series · 14 of 36 positions shown, 17 images · non-contrast
Comparison: Most recent CT chest 09/08/2021. 01/13/2009 PET-CT.

CLINICAL DATA: Primary Cancer Type: Lung * Tracking Code: BO *
TECHNIQUE: Multidetector CT imaging of the chest was performed during
intravenous contrast administration.

[Series 2: axial st · axial · 0.62mm/px · z∈[+1382,+1650]mm · 11 of 160 slices shown, 14 images]
[im 13/160  mediastinal]
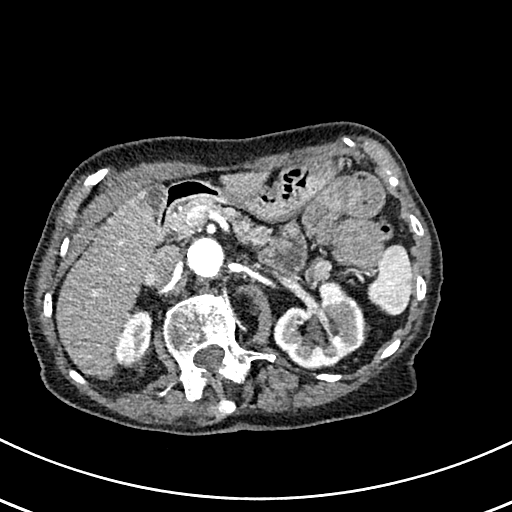
[im 13/160  lung]
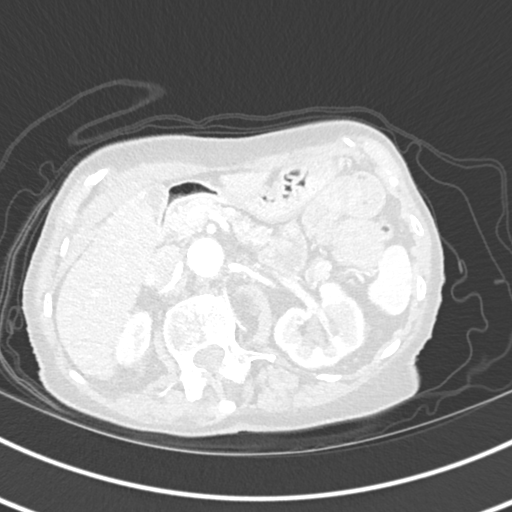
[im 25/160  lung]
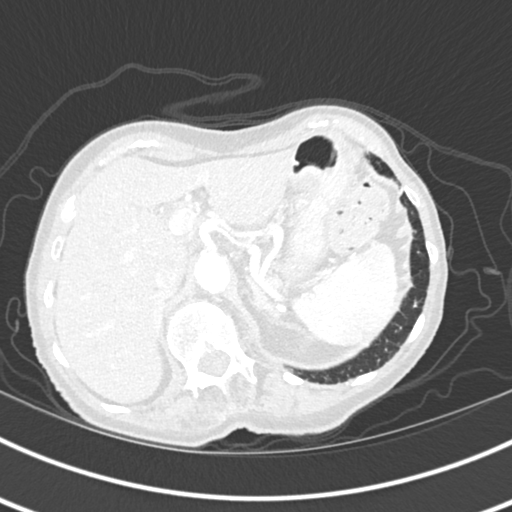
[im 37/160  lung]
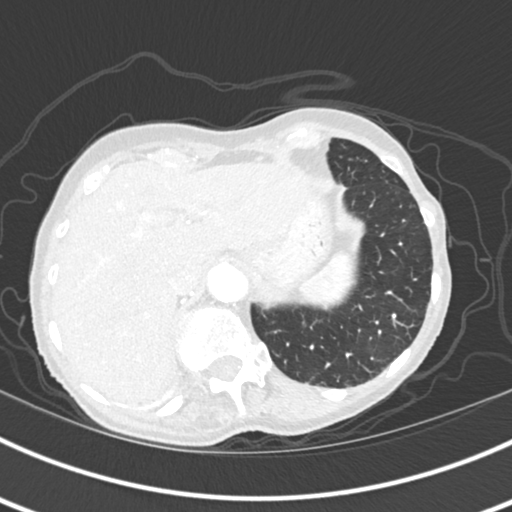
[im 49/160  lung]
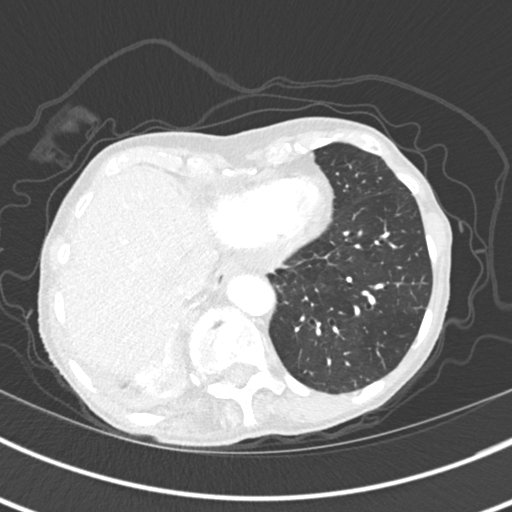
[im 62/160  mediastinal]
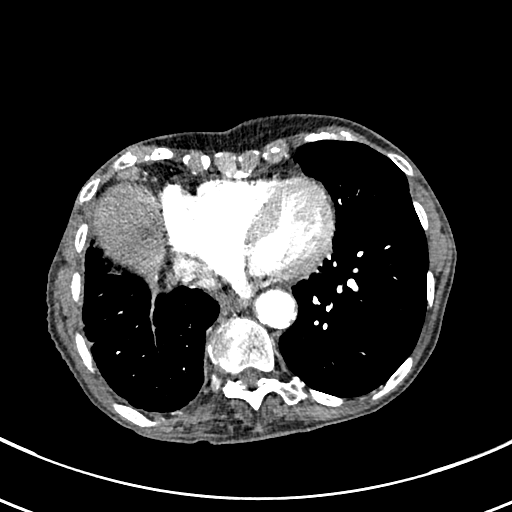
[im 62/160  lung]
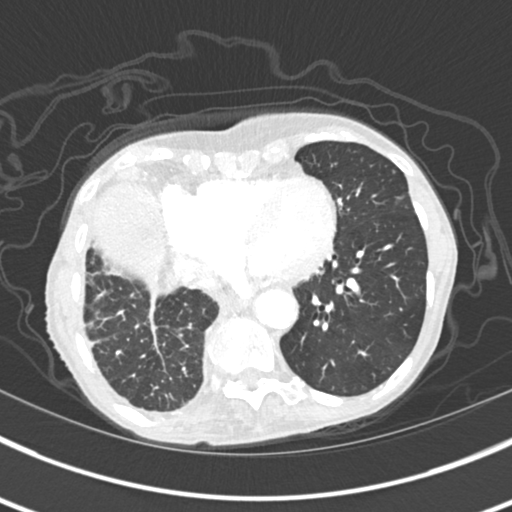
[im 86/160  lung]
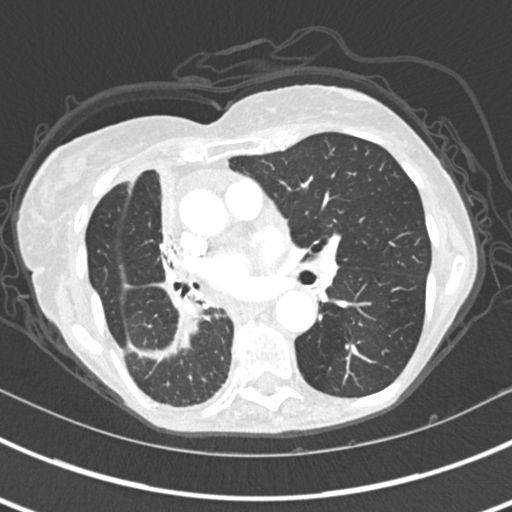
[im 98/160  lung]
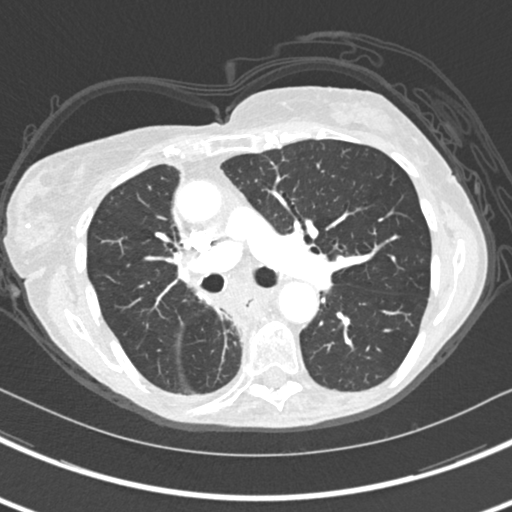
[im 111/160  lung]
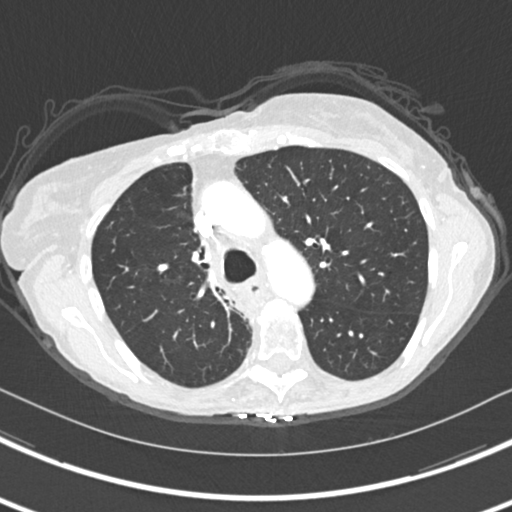
[im 123/160  mediastinal]
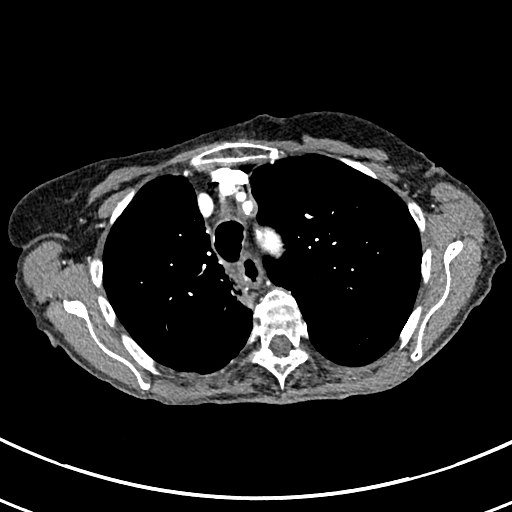
[im 123/160  lung]
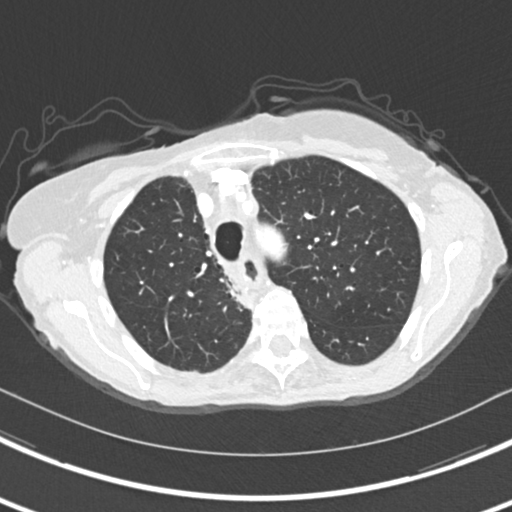
[im 135/160  lung]
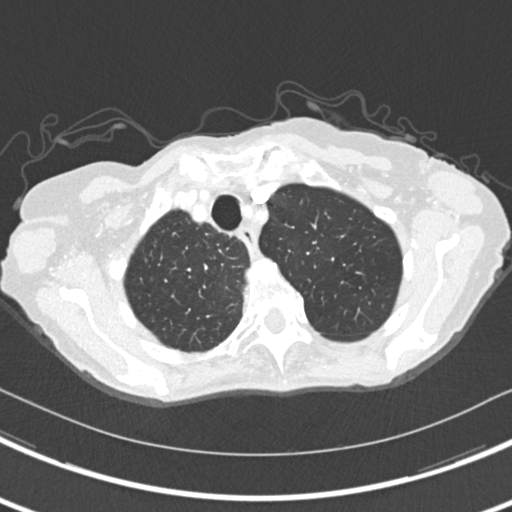
[im 147/160  lung]
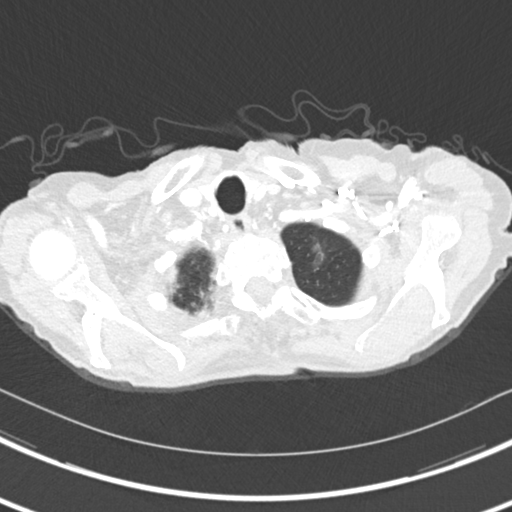

[Series 6: coronal · coronal · 0.57mm/px · 3 of 115 slices shown]
[im 23/115  lung]
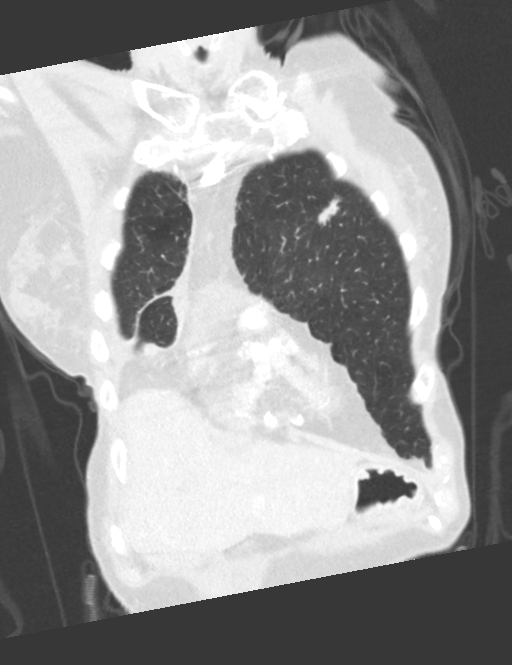
[im 46/115  lung]
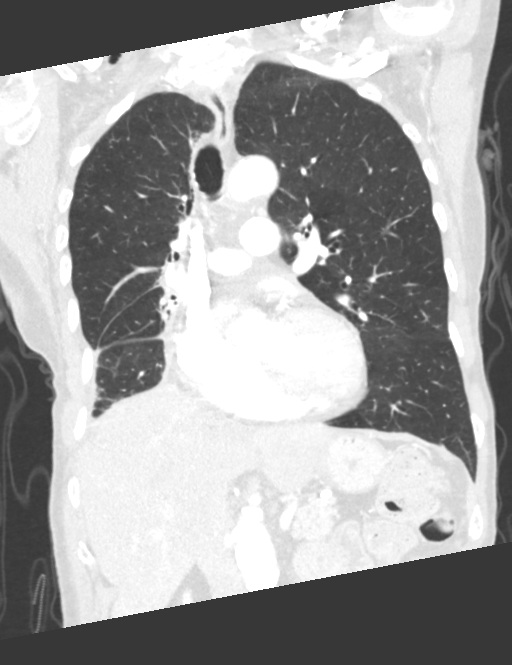
[im 69/115  lung]
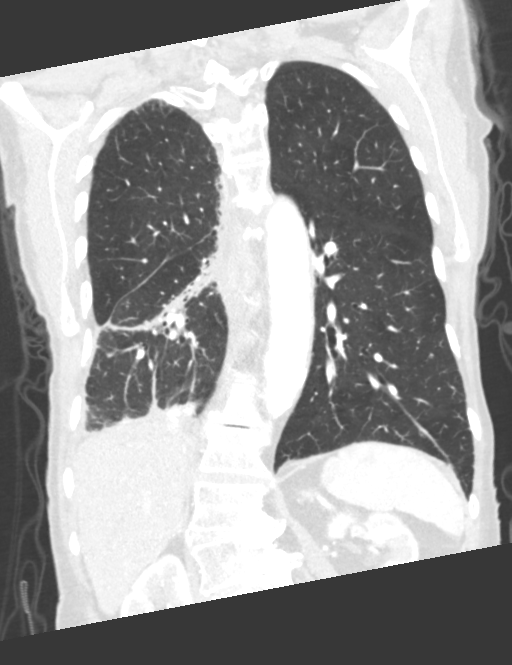

[14 of 36 positions shown; findings below may reference images not displayed]

Imaging Indication: Routine surveillance

Interval therapy since last imaging? Yes

Initial Cancer Diagnosis

Date: 01/26/2009; Established by: Biopsy-proven

Detailed Pathology: Stage IIIB non-small cell lung cancer,
adenocarcinoma.

Primary Tumor location:  Right middle lobe.

Surgeries: Microlaryngoscopy with vocal cord injections 02/08/2021.
Talc pleurodesis, right, 09/16/2014. Hysterectomy. Appendectomy.

Chemotherapy: Yes; Ongoing? Yes; Tarceva daily

Immunotherapy? No

EXAM:
CT CHEST WITH CONTRAST
RADIATION DOSE REDUCTION: This exam was performed according to the
departmental dose-optimization program which includes automated
exposure control, adjustment of the mA and/or kV according to
patient size and/or use of iterative reconstruction technique.

CONTRAST:  75mL OMNIPAQUE IOHEXOL 300 MG/ML  SOLN
FINDINGS: Cardiovascular: Coronary artery calcification and aortic
atherosclerotic calcification.

Mediastinum/Nodes: There is haziness within the mediastinal fat with
poor tissue plane differentiation along the esophagus. No change
from comparison exam. No enlarged lymph nodes are identified.

No pericardial fluid.

Lungs/Pleura: A 10 mm sub solid nodule in the LEFT lung apex is
unchanged from prior (image [DATE]).

Peripheral solid nodule in the LEFT upper lobe measures 13 mm (image
58/5) compared to 13 mm remeasured on most recent CT. However,
compared to more remote CT scan, current nodule measuring 13 mm x 10
mm compares to 11 mm 9 mm and has a more solid appearance than on CT
03/13/2021.

Volume loss in the RIGHT hemithorax consistent with lung resection.
Band like pleuroparenchymal thickening and bronchiectasis in the
lower lung consistent radiation change. Talc pleurodesis noted in
the medial RIGHT lung base. No interval change.

Upper Abdomen: Limited view of the liver, kidneys, pancreas are
unremarkable. Normal adrenal glands.

Musculoskeletal: No aggressive osseous lesion.
IMPRESSION: 1. Post therapy change in the RIGHT hemithorax without evidence of
lung cancer recurrence.
2. Peripheral nodule in the anterior LEFT upper lobe is more dense
than comparison CT 03/13/2021. Consider close interval CT
surveillance versus FDG PET scan for further evaluation.
3. Ill-defined tissue planes in the mediastinum favored related to
prior radiation therapy. No mediastinal adenopathy.

## 2022-05-28 NOTE — Telephone Encounter (Signed)
Re ran patient for Durolane currently awaiting insurance reply

## 2022-05-29 ENCOUNTER — Telehealth: Payer: Self-pay | Admitting: Internal Medicine

## 2022-05-29 ENCOUNTER — Ambulatory Visit: Payer: Medicare Other | Admitting: Sports Medicine

## 2022-05-29 NOTE — Telephone Encounter (Signed)
Scheduled per 05/23 los, patient has been called and notified.

## 2022-05-29 NOTE — Progress Notes (Unsigned)
Benito Mccreedy D.[place] Phone: [phone]   Assessment and Plan:     There are no diagnoses linked to this encounter.  ***   Pertinent previous records reviewed include ***   Follow Up: ***     Subjective:   I, Dana [name], am serving as a Education administrator for Doctor Glennon Mac   Chief Complaint: knee pain    HPI:    09/05/21 Patient states that she has been seeing Dr. Georgina Snell for right knee pain and gets steroid injections to help with the pain. Last steroid injection was 08/02/2020.  Patient states that she had no relief of pain from previous injection.  Patient usually only has pain while walking it is fine at rest. Patient will use the votaren gel to help with pain. Patient ambulates with a walker. Patient locates pain to Medial Right patella.   11/28/21 Patient states knee feels terrible today all over  has a small knee brace on    12/29/2021 Patient states that the knee isn't better has a small brace on today cant get the big brace on and off . With the big brace and roll aider can walk but the knee is not better. Would like a print out of shot that was given    05/30/2022 Patient states   Relevant Historical Information: lung cancer, chronic knee pain with OA   Additional pertinent review of systems negative.   Current Outpatient Medications:    antiseptic oral rinse (BIOTENE) LIQD, 15 mLs by Mouth Rinse route as needed for dry mouth., Disp: , Rfl:    augmented betamethasone dipropionate (DIPROLENE-AF) 0.05 % cream, Apply topically 2 (two) times daily., Disp: , Rfl:    b complex vitamins capsule, Take 1 capsule by mouth daily., Disp: , Rfl:    bismuth subsalicylate (PEPTO BISMOL) 262 MG/15ML suspension, Take 30 mLs by mouth every three (3) days as needed., Disp: , Rfl:    Calcium-Vitamin D ([employer]S CALCIUM-600/VIT D PO), Take 2 tablets by mouth daily., Disp: , Rfl:    cholecalciferol (VITAMIN  D) 1000 UNITS tablet, Take 2,000 Units by mouth daily., Disp: , Rfl:    erlotinib (TARCEVA) 100 MG tablet, Take 1 tablet (100 mg total) by mouth daily., Disp: 30 tablet, Rfl: 3   hydrocortisone 2.5 % cream, Apply 1 application. topically 2 (two) times daily., Disp: , Rfl:    lactase (LACTAID) 3000 units tablet, Take 3,000 Units by mouth daily., Disp: , Rfl:    loratadine (CLARITIN) 10 MG tablet, Take 10 mg by mouth daily as needed for allergies., Disp: , Rfl:    Misc Natural Products (OSTEO BI-FLEX JOINT SHIELD PO), Take 2 tablets by mouth daily., Disp: , Rfl:    Polyethyl Glycol-Propyl Glycol (SYSTANE OP), Place 1 drop into both eyes at bedtime as needed (for dry eyes.)., Disp: , Rfl:    Simethicone (GAS-X PO), Take 1 tablet by mouth 2 (two) times daily as needed (for gas). , Disp: , Rfl:    Tiotropium Bromide-Olodaterol (STIOLTO RESPIMAT) 2.5-2.5 MCG/ACT AERS, Inhale 2 puffs into the lungs daily., Disp: 4 g, Rfl: 6   vitamin B-12 (CYANOCOBALAMIN) 1000 MCG tablet, Take 1 tablet by mouth daily., Disp: , Rfl:    Objective:     There were no vitals filed for this visit.    There is no height or weight on file to calculate BMI.    Physical Exam:    ***  Electronically signed by:  Benito Mccreedy D.[name] 7:54 AM 05/29/22

## 2022-05-30 ENCOUNTER — Ambulatory Visit (INDEPENDENT_AMBULATORY_CARE_PROVIDER_SITE_OTHER): Payer: Medicare Other | Admitting: Sports Medicine

## 2022-05-30 VITALS — BP 120/82 | Ht 64.0 in | Wt 107.0 lb

## 2022-05-30 DIAGNOSIS — M1711 Unilateral primary osteoarthritis, right knee: Secondary | ICD-10-CM | POA: Diagnosis not present

## 2022-05-30 DIAGNOSIS — M25561 Pain in right knee: Secondary | ICD-10-CM

## 2022-05-30 NOTE — Patient Instructions (Addendum)
Good to see you  Tylenol [phone] mg 2-3 times a day for pain relief  6 month follow up can call and come in sooner if needed

## 2022-06-06 ENCOUNTER — Other Ambulatory Visit: Payer: Self-pay | Admitting: Internal Medicine

## 2022-06-07 ENCOUNTER — Other Ambulatory Visit: Payer: Self-pay | Admitting: Internal Medicine

## 2022-06-18 DIAGNOSIS — L98492 Non-pressure chronic ulcer of skin of other sites with fat layer exposed: Secondary | ICD-10-CM | POA: Diagnosis not present

## 2022-06-18 DIAGNOSIS — L2089 Other atopic dermatitis: Secondary | ICD-10-CM | POA: Diagnosis not present

## 2022-07-08 ENCOUNTER — Other Ambulatory Visit: Payer: Self-pay | Admitting: Internal Medicine

## 2022-07-10 ENCOUNTER — Telehealth: Payer: Self-pay | Admitting: Internal Medicine

## 2022-07-10 MED ORDER — STIOLTO RESPIMAT 2.5-2.5 MCG/ACT IN AERS: INHALATION_SPRAY | RESPIRATORY_TRACT | 0 refills | Status: AC

## 2022-07-10 NOTE — Telephone Encounter (Signed)
Called and spoke with patient's daughter Charlett Nose. She confirmed the patient needed a refill on her Stiolto. Advised her I would send it in for her, she verbalized understanding.   Nothing further needed at time of call.

## 2022-07-13 DIAGNOSIS — M810 Age-related osteoporosis without current pathological fracture: Secondary | ICD-10-CM | POA: Diagnosis not present

## 2022-07-16 ENCOUNTER — Inpatient Hospital Stay: Payer: Medicare Other | Attending: Internal Medicine

## 2022-07-16 ENCOUNTER — Other Ambulatory Visit: Payer: Self-pay

## 2022-07-16 ENCOUNTER — Ambulatory Visit (HOSPITAL_COMMUNITY)
Admission: RE | Admit: 2022-07-16 | Discharge: 2022-07-16 | Disposition: A | Discharge status: Home / Self Care | Payer: Medicare Other | Source: Ambulatory Visit | Attending: Internal Medicine | Admitting: Internal Medicine

## 2022-07-16 DIAGNOSIS — Z79899 Other long term (current) drug therapy: Secondary | ICD-10-CM | POA: Insufficient documentation

## 2022-07-16 DIAGNOSIS — I10 Essential (primary) hypertension: Secondary | ICD-10-CM | POA: Insufficient documentation

## 2022-07-16 DIAGNOSIS — Z87442 Personal history of urinary calculi: Secondary | ICD-10-CM | POA: Insufficient documentation

## 2022-07-16 DIAGNOSIS — M810 Age-related osteoporosis without current pathological fracture: Secondary | ICD-10-CM | POA: Insufficient documentation

## 2022-07-16 DIAGNOSIS — C3490 Malignant neoplasm of unspecified part of unspecified bronchus or lung: Secondary | ICD-10-CM | POA: Diagnosis not present

## 2022-07-16 DIAGNOSIS — J449 Chronic obstructive pulmonary disease, unspecified: Secondary | ICD-10-CM | POA: Insufficient documentation

## 2022-07-16 DIAGNOSIS — Z85118 Personal history of other malignant neoplasm of bronchus and lung: Secondary | ICD-10-CM | POA: Insufficient documentation

## 2022-07-16 DIAGNOSIS — C342 Malignant neoplasm of middle lobe, bronchus or lung: Secondary | ICD-10-CM | POA: Insufficient documentation

## 2022-07-16 DIAGNOSIS — R911 Solitary pulmonary nodule: Secondary | ICD-10-CM | POA: Diagnosis not present

## 2022-07-16 DIAGNOSIS — R21 Rash and other nonspecific skin eruption: Secondary | ICD-10-CM | POA: Insufficient documentation

## 2022-07-16 DIAGNOSIS — K219 Gastro-esophageal reflux disease without esophagitis: Secondary | ICD-10-CM | POA: Insufficient documentation

## 2022-07-16 LAB — CMP (CANCER CENTER ONLY)
ALT: 22 U/L (ref 0–44)
AST: 28 U/L (ref 15–41)
Albumin: 3.9 g/dL (ref 3.5–5.0)
Alkaline Phosphatase: 41 U/L (ref 38–126)
Anion gap: 6 (ref 5–15)
BUN: 26 mg/dL — ABNORMAL HIGH (ref 8–23)
CO2: 31 mmol/L (ref 22–32)
Calcium: 9.2 mg/dL (ref 8.9–10.3)
Chloride: 105 mmol/L (ref 98–111)
Creatinine: 0.9 mg/dL (ref 0.44–1.00)
GFR, Estimated: >60 mL/min (ref >60)
Glucose, Bld: 91 mg/dL (ref 70–99)
Potassium: 3.6 mmol/L (ref 3.5–5.1)
Sodium: 142 mmol/L (ref 135–145)
Total Bilirubin: 1.2 mg/dL (ref 0.3–1.2)
Total Protein: 6.2 g/dL — ABNORMAL LOW (ref 6.5–8.1)

## 2022-07-16 LAB — CBC WITH DIFFERENTIAL (CANCER CENTER ONLY)
Abs Immature Granulocytes: 0.03 10*3/uL (ref 0.00–0.07)
Basophils Absolute: 0 10*3/uL (ref 0.0–0.1)
Basophils Relative: 0 %
Eosinophils Absolute: 0.2 10*3/uL (ref 0.0–0.5)
Eosinophils Relative: 3 %
HCT: 39 % (ref 36.0–46.0)
Hemoglobin: 13.5 g/dL (ref 12.0–15.0)
Immature Granulocytes: 0 %
Lymphocytes Relative: 10 %
Lymphs Abs: 0.7 10*3/uL (ref 0.7–4.0)
MCH: 32.8 pg (ref 26.0–34.0)
MCHC: 34.6 g/dL (ref 30.0–36.0)
MCV: 94.9 fL (ref 80.0–100.0)
Monocytes Absolute: 0.6 10*3/uL (ref 0.1–1.0)
Monocytes Relative: 9 %
Neutro Abs: 5.7 10*3/uL (ref 1.7–7.7)
Neutrophils Relative %: 78 %
Platelet Count: 233 10*3/uL (ref 150–400)
RBC: 4.11 MIL/uL (ref 3.87–5.11)
RDW: 14.1 % (ref 11.5–15.5)
WBC Count: 7.3 10*3/uL (ref 4.0–10.5)
nRBC: 0 % (ref 0.0–0.2)

## 2022-07-16 MED ORDER — IOHEXOL 300 MG/ML  SOLN
75.0000 mL | Freq: Once | INTRAMUSCULAR | Status: AC | PRN
  Administered 2022-07-16: 75 mL via INTRAVENOUS

## 2022-07-18 ENCOUNTER — Other Ambulatory Visit: Payer: Self-pay

## 2022-07-18 ENCOUNTER — Inpatient Hospital Stay: Payer: Medicare Other | Admitting: Internal Medicine

## 2022-07-18 VITALS — BP 163/96 | HR 103 | Temp 97.9°F | Resp 17 | Wt 107.1 lb

## 2022-07-18 DIAGNOSIS — I10 Essential (primary) hypertension: Secondary | ICD-10-CM | POA: Diagnosis not present

## 2022-07-18 DIAGNOSIS — Z85118 Personal history of other malignant neoplasm of bronchus and lung: Secondary | ICD-10-CM | POA: Diagnosis not present

## 2022-07-18 DIAGNOSIS — R21 Rash and other nonspecific skin eruption: Secondary | ICD-10-CM | POA: Diagnosis not present

## 2022-07-18 DIAGNOSIS — C342 Malignant neoplasm of middle lobe, bronchus or lung: Secondary | ICD-10-CM | POA: Diagnosis not present

## 2022-07-18 DIAGNOSIS — Z87442 Personal history of urinary calculi: Secondary | ICD-10-CM | POA: Diagnosis not present

## 2022-07-18 DIAGNOSIS — Z79899 Other long term (current) drug therapy: Secondary | ICD-10-CM | POA: Diagnosis not present

## 2022-07-18 DIAGNOSIS — M810 Age-related osteoporosis without current pathological fracture: Secondary | ICD-10-CM | POA: Diagnosis not present

## 2022-07-18 DIAGNOSIS — J449 Chronic obstructive pulmonary disease, unspecified: Secondary | ICD-10-CM | POA: Diagnosis not present

## 2022-07-18 DIAGNOSIS — K219 Gastro-esophageal reflux disease without esophagitis: Secondary | ICD-10-CM | POA: Diagnosis not present

## 2022-07-18 DIAGNOSIS — C3491 Malignant neoplasm of unspecified part of right bronchus or lung: Secondary | ICD-10-CM

## 2022-07-18 NOTE — Progress Notes (Signed)
[place] [phone]) [phone]   [phone]  OFFICE PROGRESS NOTE  [name], MD [place] [address]  DIAGNOSIS: Stage IIIB (T2a, N3, M0) non-small cell lung cancer, adenocarcinoma with positive EGFR mutation diagnosed in January 2010.  PRIOR THERAPY: 1) Status post concurrent chemoradiation with weekly carboplatin and paclitaxel; last dose given March 21, 2009.  2) Tarceva at 150 mg p.o. daily, status post approximately 48 months of treatment, discontinued secondary to persistent diarrhea.  3) status post right Pleurx catheter placement for right nonmalignant pleural effusion.  CURRENT THERAPY: Tarceva 100 mg by mouth daily started 03/19/2013, status post 112  months of treatment.  INTERVAL HISTORY: Dana [name] 87 y.o. female returns to the clinic today for follow-up visit.  The patient is feeling fine today with no concerning complaints except for the rash and desquamation of the skin on her right wrist.  She was seen by Dr. Saundra Shelling a dermatologist at [facility] for evaluation of the right wrist skin lesion.  He started him on treatment with tacrolimus.  The patient denied having any current chest pain, shortness of breath, cough or hemoptysis.  She has no nausea, vomiting, diarrhea or constipation.  She has no headache or visual changes.  She has no recent weight loss or night sweats.  She continues to tolerate her treatment with Tarceva fairly well.  She had repeat CT scan of the chest performed recently and she is here for evaluation and discussion of her scan results.   MEDICAL HISTORY: Past Medical History:  Diagnosis Date   Arthritis    HANDS,  WRISTS   COPD (chronic obstructive pulmonary disease) ([place])    Depression 04/04/2017   Dyspnea on exertion    Encounter for therapeutic drug monitoring 10/01/2016   GERD (gastroesophageal reflux disease)    at times   Hemorrhoid    History of anal  fissures    History of kidney stones    History of lung cancer ONCOLOGIST--  DR [name]--  LAST CT ,  NO RECURRENCE OR METS   DX JAN 2010 --  STAGE IIIA  NON-SMALL CELL ADENOCARCINOMA (RIGHT MIDDLE LOBE)---  S/P  CHEMORADIATIO THERAPY  (COMPLETE 03-21-2009)   HTN (hypertension) 10/10/2017   white coat syndrome   Imbalance 06/04/2017   lung ca dx'd 2010   Normal cardiac stress test    2007  PER PT   Osteoporosis    Rash, skin    RIGHT FOREARM/ HAND   Right wrist pain    MASS   Wears glasses     ALLERGIES:  is allergic to methotrexate, clindamycin hcl, amoxicillin, and protonix [pantoprazole sodium].  MEDICATIONS:  Current Outpatient Medications  Medication Sig Dispense Refill   antiseptic oral rinse (BIOTENE) LIQD 15 mLs by Mouth Rinse route as needed for dry mouth.     augmented betamethasone dipropionate (DIPROLENE-AF) 0.05 % cream Apply topically 2 (two) times daily.     b complex vitamins capsule Take 1 capsule by mouth daily.     bismuth subsalicylate (PEPTO BISMOL) 262 MG/15ML suspension Take 30 mLs by mouth every three (3) days as needed.     Calcium-Vitamin D ([employer]S CALCIUM-600/VIT D PO) Take 2 tablets by mouth daily.     cholecalciferol (VITAMIN D) 1000 UNITS tablet Take 2,000 Units by mouth daily.     erlotinib (TARCEVA) 100 MG tablet Take 1 tablet (100 mg total) by mouth daily. 30 tablet 3  hydrocortisone 2.5 % cream Apply 1 application. topically 2 (two) times daily.     lactase (LACTAID) 3000 units tablet Take 3,000 Units by mouth daily.     loratadine (CLARITIN) 10 MG tablet Take 10 mg by mouth daily as needed for allergies.     Misc Natural Products (OSTEO BI-FLEX JOINT SHIELD PO) Take 2 tablets by mouth daily.     Polyethyl Glycol-Propyl Glycol (SYSTANE OP) Place 1 drop into both eyes at bedtime as needed (for dry eyes.).     Simethicone (GAS-X PO) Take 1 tablet by mouth 2 (two) times daily as needed (for gas).      Tiotropium Bromide-Olodaterol (STIOLTO RESPIMAT)  2.5-2.5 MCG/ACT AERS INHALE 2 PUFFS BY MOUTH ONCE DAILY 4 g 0   vitamin B-12 (CYANOCOBALAMIN) 1000 MCG tablet Take 1 tablet by mouth daily.     No current facility-administered medications for this visit.    SURGICAL HISTORY:  Past Surgical History:  Procedure Laterality Date   ANAL FISSURE REPAIR  07/09/2011   INTERNAL SPHINCTEROTOMY   BENIGN EXCISION LEFT BREAST CENTRAL DUCT  02/1999   BREAST BIOPSY  01/31/2009   benign   BREAST EXCISIONAL BIOPSY Left 2004   benign   CHEST TUBE INSERTION Right 07/14/2014   Procedure: INSERTION PLEURAL DRAINAGE CATHETER RIGHT CHEST;  Surgeon: Grace Isaac, MD;  Location: [place];  Service: Thoracic;  Laterality: Right;   EXCISION RIGHT WRIST MASS  2012   EXTRACORPOREAL SHOCK WAVE LITHOTRIPSY Left 06/01/2019   Procedure: EXTRACORPOREAL SHOCK WAVE LITHOTRIPSY (ESWL);  Surgeon: Cleon Gustin, MD;  Location: [facility];  Service: Urology;  Laterality: Left;   KNEE ARTHROSCOPY Right 1999   MASS EXCISION Right 03/11/2014   Procedure: RIGHT WRIST DEEP MASS EXCISION WITH CULTURE AND BIOSPY;  Surgeon: Linna Hoff, MD;  Location: [place];  Service: Orthopedics;  Laterality: Right;   MICROLARYNGOSCOPY W/VOCAL CORD INJECTION Bilateral 02/08/2021   Procedure: MICROLARYNGOSCOPY WITH VOCAL CORD INJECTION;  Surgeon: Melida Quitter, MD;  Location: [place];  Service: ENT;  Laterality: Bilateral;   REMOVAL OF PLEURAL DRAINAGE CATHETER Right 10/14/2014   Procedure: REMOVAL OF PLEURAL DRAINAGE CATHETER;  Surgeon: Grace Isaac, MD;  Location: [place];  Service: Thoracic;  Laterality: Right;   TALC PLEURODESIS Right 09/16/2014   Procedure: TALC PLEURADESIS/ slurry;  Surgeon: Grace Isaac, MD;  Location: [place] [facility];  Service: Thoracic;  Laterality: Right;   THORACOSCOPY  01-26-2009   w/   lung/  node biopsy's   THUMB ARTHROSCOPY Left    - removed bone spur   TONSILLECTOMY  AS CHILD   TOTAL ABDOMINAL HYSTERECTOMY W/ BILATERAL SALPINGOOPHORECTOMY  1982    W/  APPENDECTOMY   TRANSTHORACIC ECHOCARDIOGRAM  12-23-2008   NORMAL LV/  EF 65-70%/  MILD MR  &  TR   VAULT SUSPENSION PLUS CYSTOCELE REPAIR WITH GRAFT  06-13-2010    REVIEW OF SYSTEMS:  Constitutional: positive for fatigue Eyes: negative Ears, nose, mouth, throat, and face: negative Respiratory: negative Cardiovascular: negative Gastrointestinal: negative Genitourinary:negative Integument/breast: positive for rash and skin lesion(s) Hematologic/lymphatic: negative Musculoskeletal:negative Neurological: negative Behavioral/Psych: negative Endocrine: negative Allergic/Immunologic: negative   PHYSICAL EXAMINATION: General appearance: alert, cooperative, fatigued, and no distress Head: Normocephalic, without obvious abnormality, atraumatic Neck: no adenopathy, no JVD, supple, symmetrical, trachea midline, and thyroid not enlarged, symmetric, no tenderness/mass/nodules Lymph nodes: Cervical, supraclavicular, and axillary nodes normal. Resp: clear to auscultation bilaterally Back: symmetric, no curvature. ROM normal. No CVA tenderness. Cardio: regular rate and rhythm, S1, S2  normal, no murmur, click, rub or gallop GI: soft, non-tender; bowel sounds normal; no masses,  no organomegaly Extremities: extremities normal, atraumatic, no cyanosis or edema Neurologic: Alert and oriented X 3, normal strength and tone. Normal symmetric reflexes. Normal coordination and gait  ECOG PERFORMANCE STATUS: 1 - Symptomatic but completely ambulatory  Blood pressure (!) 163/96, pulse (!) 103, temperature 97.9 F (36.6 C), temperature source Tympanic, resp. rate 17, weight 107 lb 1 oz (48.6 kg), SpO2 99 %.  LABORATORY DATA: Lab Results  Component Value Date   WBC 7.3 07/16/2022   HGB 13.5 07/16/2022   HCT 39.0 07/16/2022   MCV 94.9 07/16/2022   PLT 233 07/16/2022      Chemistry      Component Value Date/Time   NA 142 07/16/2022 1011   NA 142 12/10/2017 1000   K 3.6 07/16/2022 1011   K  4.0 12/10/2017 1000   CL 105 07/16/2022 1011   CL 108 (H) 06/03/2013 0939   CO2 31 07/16/2022 1011   CO2 28 12/10/2017 1000   BUN 26 (H) 07/16/2022 1011   BUN 20.4 12/10/2017 1000   CREATININE 0.90 07/16/2022 1011   CREATININE 0.9 12/10/2017 1000      Component Value Date/Time   CALCIUM 9.2 07/16/2022 1011   CALCIUM 9.7 12/10/2017 1000   ALKPHOS 41 07/16/2022 1011   ALKPHOS 63 12/10/2017 1000   AST 28 07/16/2022 1011   AST 25 12/10/2017 1000   ALT 22 07/16/2022 1011   ALT 17 12/10/2017 1000   BILITOT 1.2 07/16/2022 1011   BILITOT 1.05 12/10/2017 1000       RADIOGRAPHIC STUDIES: CT Chest W Contrast  Result Date: 07/16/2022 CLINICAL DATA:  Non-small cell lung cancer staging; * Tracking Code: BO * EXAM: CT CHEST WITH CONTRAST TECHNIQUE: Multidetector CT imaging of the chest was performed during intravenous contrast administration. RADIATION DOSE REDUCTION: This exam was performed according to the departmental dose-optimization program which includes automated exposure control, adjustment of the mA and/or kV according to patient size and/or use of iterative reconstruction technique. CONTRAST:  49mL OMNIPAQUE IOHEXOL 300 MG/ML  SOLN COMPARISON:  Chest CT dated March 15, 2022; chest CT dated September 08, 2021 FINDINGS: Cardiovascular: Normal heart size. No pericardial effusion. Moderate left main and three-vessel coronary artery calcifications. Normal caliber thoracic aorta with moderate atherosclerotic disease. No suspicious filling defects of the central pulmonary arteries. Mediastinum/Nodes: Esophagus is unremarkable. Thyroid is unremarkable. Unchanged mild mediastinal soft tissue stranding. No pathologically enlarged lymph nodes in the chest. Stable subcentimeter 6 mm lymph node of the right pericardial fat located on series 2, image 106. Lungs/Pleura: Central airways are patent. Stable right paramediastinal postradiation changes. Right pleural thickening and calcifications, unchanged when  compared to prior exam. Part solid pulmonary nodule of the left lung apex measuring 13 mm in overall diameter with 8 mm solid component, unchanged when compared to recent prior exams, but increased in size and density when compared with more remote prior exams, for example chest CT dated February 25, 2017. Solid pulmonary nodule of the anterior left upper lobe measuring 13 x 10 mm on series 5, image 64, unchanged in size when compared most recent prior exam, but increased in density when compared with September 08, 2021 prior. Upper Abdomen: No acute abnormality. Musculoskeletal: No aggressive appearing osseous lesions. IMPRESSION: 1. Stable right paramediastinal postradiation changes. No evidence of recurrent disease. 2. Solid pulmonary nodule of the anterior left upper lobe is unchanged when compared with most recent prior exam, but  has increased in size and density when compared with September 08, 2021 prior exam and more remote prior exams, compatible with primary lung adenocarcinoma. 3. Part solid nodule of the left lung apex, stable when compared with recent priors; however, nodule has increased in size and density when compared with more remote prior exams and is compatible with additional site of slow growing adenocarcinoma. Electronically Signed   By: Yetta Glassman M.D.   On: 07/16/2022 17:28    ASSESSMENT AND PLAN:  This is a very pleasant 87 years old white female with stage IIIB non-small cell lung cancer, adenocarcinoma with positive EGFR mutation diagnosed in January 2010. She has been on treatment with Tarceva for more than 9 years.   She is currently on Tarceva 100 mg p.o. daily status post 112  months. The patient has been tolerating this treatment well with no concerning adverse effects. She had repeat CT scan of the chest performed recently.  I personally and independently reviewed the scan images and discussed the result and showed the images to the patient today. I understand the  radiologist is concerned about slightly increasing size of her left upper lobe lung nodule but in comparison the difference is not significant compared to 18 months ago. I recommended for the patient to continue her current treatment with Tarceva with the same dose. I will see her back for follow-up visit in 3 months for evaluation and repeat blood work. The patient was advised to call immediately if she has any other concerning symptoms in the interval. For the insomnia and depression, she is currently on Remeron but her dose was reduced to 7.5 mg p.o. daily.  The patient voices understanding of current disease status and treatment options and is in agreement with the current care plan. All questions were answered. The patient knows to call the clinic with any problems, questions or concerns. We can certainly see the patient much sooner if necessary. Disclaimer: This note was dictated with voice recognition software. Similar sounding words can inadvertently be transcribed and may not be corrected upon review.

## 2022-08-06 ENCOUNTER — Ambulatory Visit
Admission: EM | Admit: 2022-08-06 | Discharge: 2022-08-06 | Disposition: A | Discharge status: Home / Self Care | Payer: Medicare Other | Attending: Urgent Care | Admitting: Urgent Care

## 2022-08-06 ENCOUNTER — Encounter: Payer: Self-pay | Admitting: Emergency Medicine

## 2022-08-06 DIAGNOSIS — W19XXXA Unspecified fall, initial encounter: Secondary | ICD-10-CM

## 2022-08-06 DIAGNOSIS — Z23 Encounter for immunization: Secondary | ICD-10-CM | POA: Diagnosis not present

## 2022-08-06 DIAGNOSIS — S0181XA Laceration without foreign body of other part of head, initial encounter: Secondary | ICD-10-CM | POA: Diagnosis not present

## 2022-08-06 DIAGNOSIS — R519 Headache, unspecified: Secondary | ICD-10-CM | POA: Diagnosis not present

## 2022-08-06 MED ORDER — TETANUS-DIPHTH-ACELL PERTUSSIS 5-2.5-18.5 LF-MCG/0.5 IM SUSY
0.5000 mL | PREFILLED_SYRINGE | Freq: Once | INTRAMUSCULAR | Status: AC
  Administered 2022-08-06: 0.5 mL via INTRAMUSCULAR

## 2022-08-06 MED ORDER — CEPHALEXIN 250 MG PO CAPS: 250.0000 mg | ORAL_CAPSULE | Freq: Three times a day (TID) | ORAL | 0 refills | Status: DC

## 2022-08-06 NOTE — ED Triage Notes (Signed)
Pt here with fall after getting dizzy when standing too fast this morning. Pt sustained laceration to right upper forehead.

## 2022-08-06 NOTE — Discharge Instructions (Signed)

## 2022-08-06 NOTE — ED Provider Notes (Signed)
[facility]   MRN: [id number] DOB: 1935/10/20  Subjective:   Dana [name] is a 87 y.o. female presenting for suffering a forehead laceration today.  Patient states that this morning she got up out of bed too fast and started to walk too quickly.  She quickly became dizzy and as she turned around to try to go back to the bed she hit her head against a piece of furniture.  She did not fall to the ground, collapsed to the ground.  No loss of consciousness, confusion, weakness, numbness or tingling, vision changes, headache.  Cannot recall her last Tdap.  No frequent falls.  Last time she fell that she can think of was about 3 years ago.  No chest pain, shortness of breath, heart racing or palpitations.  Past medical history does indicate that she has a history of imbalance.  She also ambulates with a cane as necessary.  No current facility-administered medications for this encounter.  Current Outpatient Medications:    antiseptic oral rinse (BIOTENE) LIQD, 15 mLs by Mouth Rinse route as needed for dry mouth., Disp: , Rfl:    augmented betamethasone dipropionate (DIPROLENE-AF) 0.05 % cream, Apply topically 2 (two) times daily., Disp: , Rfl:    b complex vitamins capsule, Take 1 capsule by mouth daily., Disp: , Rfl:    bismuth subsalicylate (PEPTO BISMOL) 262 MG/15ML suspension, Take 30 mLs by mouth every three (3) days as needed., Disp: , Rfl:    Calcium-Vitamin D (CVS CALCIUM-600/VIT D PO), Take 2 tablets by mouth daily., Disp: , Rfl:    cholecalciferol (VITAMIN D) 1000 UNITS tablet, Take 2,000 Units by mouth daily., Disp: , Rfl:    erlotinib (TARCEVA) 100 MG tablet, Take 1 tablet (100 mg total) by mouth daily., Disp: 30 tablet, Rfl: 3   hydrocortisone 2.5 % cream, Apply 1 application. topically 2 (two) times daily., Disp: , Rfl:    lactase (LACTAID) 3000 units tablet, Take 3,000 Units by mouth daily., Disp: , Rfl:    loratadine (CLARITIN) 10 MG tablet, Take 10 mg by  mouth daily as needed for allergies., Disp: , Rfl:    Misc Natural Products (OSTEO BI-FLEX JOINT SHIELD PO), Take 2 tablets by mouth daily., Disp: , Rfl:    Polyethyl Glycol-Propyl Glycol (SYSTANE OP), Place 1 drop into both eyes at bedtime as needed (for dry eyes.)., Disp: , Rfl:    Simethicone (GAS-X PO), Take 1 tablet by mouth 2 (two) times daily as needed (for gas). , Disp: , Rfl:    Tiotropium Bromide-Olodaterol (STIOLTO RESPIMAT) 2.5-2.5 MCG/ACT AERS, INHALE 2 PUFFS BY MOUTH ONCE DAILY, Disp: 4 g, Rfl: 0   vitamin B-12 (CYANOCOBALAMIN) 1000 MCG tablet, Take 1 tablet by mouth daily., Disp: , Rfl:    Allergies  Allergen Reactions   Methotrexate Shortness Of Breath   Clindamycin Hcl Diarrhea and Other (See Comments)   Amoxicillin Diarrhea    Hx of C Diff    Protonix [Pantoprazole Sodium] Rash    Past Medical History:  Diagnosis Date   Arthritis    HANDS,  WRISTS   COPD (chronic obstructive pulmonary disease) ([place])    Depression 04/04/2017   Dyspnea on exertion    Encounter for therapeutic drug monitoring 10/01/2016   GERD (gastroesophageal reflux disease)    at times   Hemorrhoid    History of anal fissures    History of kidney stones    History of lung cancer ONCOLOGIST--  DR [name]--  LAST  CT ,  NO RECURRENCE OR METS   DX JAN 2010 --  STAGE IIIA  NON-SMALL CELL ADENOCARCINOMA (RIGHT MIDDLE LOBE)---  S/P  CHEMORADIATIO THERAPY  (COMPLETE 03-21-2009)   HTN (hypertension) 10/10/2017   white coat syndrome   Imbalance 06/04/2017   lung ca dx'd 2010   Normal cardiac stress test    2007  PER PT   Osteoporosis    Rash, skin    RIGHT FOREARM/ HAND   Right wrist pain    MASS   Wears glasses      Past Surgical History:  Procedure Laterality Date   ANAL FISSURE REPAIR  07/09/2011   INTERNAL SPHINCTEROTOMY   BENIGN EXCISION LEFT BREAST CENTRAL DUCT  02/1999   BREAST BIOPSY  01/31/2009   benign   BREAST EXCISIONAL BIOPSY Left 2004   benign   CHEST TUBE INSERTION Right  07/14/2014   Procedure: INSERTION PLEURAL DRAINAGE CATHETER RIGHT CHEST;  Surgeon: Grace Isaac, MD;  Location: [facility];  Service: Thoracic;  Laterality: Right;   EXCISION RIGHT WRIST MASS  2012   EXTRACORPOREAL SHOCK WAVE LITHOTRIPSY Left 06/01/2019   Procedure: EXTRACORPOREAL SHOCK WAVE LITHOTRIPSY (ESWL);  Surgeon: Cleon Gustin, MD;  [facility];  Service: Urology;  Laterality: Left;   KNEE ARTHROSCOPY Right 1999   MASS EXCISION Right 03/11/2014   Procedure: RIGHT WRIST DEEP MASS EXCISION WITH CULTURE AND BIOSPY;  Surgeon: Linna Hoff, MD;  Location: [facility];  Service: Orthopedics;  Laterality: Right;   MICROLARYNGOSCOPY W/VOCAL CORD INJECTION Bilateral 02/08/2021   Procedure: MICROLARYNGOSCOPY WITH VOCAL CORD INJECTION;  Surgeon: Melida Quitter, MD;  Location: [facility];  Service: ENT;  Laterality: Bilateral;   REMOVAL OF PLEURAL DRAINAGE CATHETER Right 10/14/2014   Procedure: REMOVAL OF PLEURAL DRAINAGE CATHETER;  Surgeon: Grace Isaac, MD;  Location: [facility];  Service: Thoracic;  Laterality: Right;   TALC PLEURODESIS Right 09/16/2014   Procedure: TALC PLEURADESIS/ slurry;  Surgeon: Grace Isaac, MD;  Location: [facility];  Service: Thoracic;  Laterality: Right;   THORACOSCOPY  01-26-2009   w/   lung/  node biopsy's   THUMB ARTHROSCOPY Left    - removed bone spur   TONSILLECTOMY  AS CHILD   TOTAL ABDOMINAL HYSTERECTOMY W/ BILATERAL SALPINGOOPHORECTOMY  1982   W/  APPENDECTOMY   TRANSTHORACIC ECHOCARDIOGRAM  12-23-2008   NORMAL LV/  EF 65-70%/  MILD MR  &  TR   VAULT SUSPENSION PLUS CYSTOCELE REPAIR WITH GRAFT  06-13-2010    Family History  Problem Relation Age of Onset   Cancer Father        bladder   Cancer Son        kidney - removed as a teenager    Social History   Tobacco Use   Smoking status: Former    Packs/day: 1.00    Years: 30.00    Total pack years: 30.00    Types: Cigarettes    Quit date: 12/24/1977    Years since quitting: 44.6    Smokeless tobacco: Never  Vaping Use   Vaping Use: Never used  Substance Use Topics   Alcohol use: No   Drug use: No    ROS   Objective:   Vitals: BP (!) 166/95 (BP Location: Right Arm)   Pulse (!) 105   Temp 97.8 F (36.6 C) (Oral)   Resp 20   SpO2 95%   Physical Exam Constitutional:      General: She is not in acute  distress.    Appearance: Normal appearance. She is well-developed and normal weight. She is not ill-appearing, toxic-appearing or diaphoretic.  HENT:     Head: Normocephalic and atraumatic.      Right Ear: Tympanic membrane, ear canal and external ear normal. No drainage or tenderness. No middle ear effusion. There is no impacted cerumen. Tympanic membrane is not erythematous.     Left Ear: Tympanic membrane, ear canal and external ear normal. No drainage or tenderness.  No middle ear effusion. There is no impacted cerumen. Tympanic membrane is not erythematous.     Nose: Nose normal. No congestion or rhinorrhea.     Mouth/Throat:     Mouth: Mucous membranes are moist. No oral lesions.     Pharynx: No pharyngeal swelling, oropharyngeal exudate, posterior oropharyngeal erythema or uvula swelling.     Tonsils: No tonsillar exudate or tonsillar abscesses.  Eyes:     General: No scleral icterus.       Right eye: No discharge.        Left eye: No discharge.     Extraocular Movements: Extraocular movements intact.     Right eye: Normal extraocular motion.     Left eye: Normal extraocular motion.     Conjunctiva/sclera: Conjunctivae normal.  Cardiovascular:     Rate and Rhythm: Normal rate.  Pulmonary:     Effort: Pulmonary effort is normal.  Musculoskeletal:     Cervical back: Normal range of motion and neck supple.  Lymphadenopathy:     Cervical: No cervical adenopathy.  Skin:    General: Skin is warm and dry.  Neurological:     General: No focal deficit present.     Mental Status: She is alert and oriented to person, place, and time.     Cranial  Nerves: No cranial nerve deficit.     Motor: No weakness.     Coordination: Coordination normal.     Gait: Gait normal.  Psychiatric:        Mood and Affect: Mood normal.        Behavior: Behavior normal.        Thought Content: Thought content normal.        Judgment: Judgment normal.    PROCEDURE NOTE: laceration repair Verbal consent obtained from patient.  Local anesthesia with 10cc Lidocaine 2% with epinephrine.  Wound scrubbed with soap and water and rinsed. Wound closed with 4-0 Prolene (simple interrupted) sutures.  Wound cleansed and dressed.   Assessment and Plan :   PDMP not reviewed this encounter.  1. Facial pain   2. Forehead laceration, initial encounter   3. Accidental fall, initial encounter     Laceration repaired successfully. Wound care reviewed. Recommended Tylenol and/or ibuprofen for pain control. Return-to-clinic precautions discussed, patient verbalized understanding. Otherwise, follow up in 10 days for suture removal.  Given the depth and length of the wound, the fact that she did not clean it at all prior to coming in I advised that we use Keflex at 250 3 times daily for antibiotic prophylaxis.  No signs of an acute intracranial injury.  Counseled patient on potential for adverse effects with medications prescribed/recommended today, ER and return-to-clinic precautions discussed, patient verbalized understanding.    Jaynee Eagles, [place] 08/06/22 1732

## 2022-08-08 DIAGNOSIS — L309 Dermatitis, unspecified: Secondary | ICD-10-CM | POA: Diagnosis not present

## 2022-08-08 DIAGNOSIS — L929 Granulomatous disorder of the skin and subcutaneous tissue, unspecified: Secondary | ICD-10-CM | POA: Diagnosis not present

## 2022-08-08 DIAGNOSIS — L98492 Non-pressure chronic ulcer of skin of other sites with fat layer exposed: Secondary | ICD-10-CM | POA: Diagnosis not present

## 2022-08-14 ENCOUNTER — Telehealth: Payer: Self-pay | Admitting: Medical Oncology

## 2022-08-14 DIAGNOSIS — C3490 Malignant neoplasm of unspecified part of unspecified bronchus or lung: Secondary | ICD-10-CM

## 2022-08-14 MED ORDER — ERLOTINIB HCL 100 MG PO TABS: 100.0000 mg | ORAL_TABLET | Freq: Every day | ORAL | 3 refills | Status: DC

## 2022-08-14 NOTE — Telephone Encounter (Signed)
Refill requested for Erlotinib.

## 2022-08-17 ENCOUNTER — Ambulatory Visit
Admission: RE | Admit: 2022-08-17 | Discharge: 2022-08-17 | Disposition: A | Discharge status: Home / Self Care | Payer: Medicare Other | Source: Ambulatory Visit | Attending: Family Medicine | Admitting: Family Medicine

## 2022-08-17 NOTE — ED Triage Notes (Signed)
Patient presents for suture removal. The wound is well healed without signs of infection.    6 sutures were removed with no complications.

## 2022-08-17 NOTE — Discharge Instructions (Signed)
Please monitor for signs of infection (drainage, odor, redness, and swelling) or any opening/s of the wound. If you notice any of these symptoms or have complications to the area please return for re-evaluation.

## 2022-08-23 ENCOUNTER — Ambulatory Visit: Payer: Self-pay

## 2022-08-23 NOTE — Patient Outreach (Signed)
  Care Coordination   08/23/2022 Name: Dana [name] MRN: [id number] DOB: [birth year]   Care Coordination Outreach Attempts:  An unsuccessful telephone outreach was attempted today to offer the patient information about available care coordination services as a benefit of their health plan.   Follow Up Plan:  Additional outreach attempts will be made to offer the patient care coordination information and services.   Encounter Outcome:  No Answer  Care Coordination Interventions Activated:  No   Care Coordination Interventions:  No, not indicated    [facility] [phone]

## 2022-08-29 DIAGNOSIS — L98492 Non-pressure chronic ulcer of skin of other sites with fat layer exposed: Secondary | ICD-10-CM | POA: Diagnosis not present

## 2022-08-29 DIAGNOSIS — M25641 Stiffness of right hand, not elsewhere classified: Secondary | ICD-10-CM | POA: Diagnosis not present

## 2022-08-29 DIAGNOSIS — Z789 Other specified health status: Secondary | ICD-10-CM | POA: Diagnosis not present

## 2022-09-05 ENCOUNTER — Ambulatory Visit: Payer: Medicare Other | Admitting: Family Medicine

## 2022-09-05 ENCOUNTER — Ambulatory Visit: Payer: Self-pay

## 2022-09-05 VITALS — BP 148/76 | HR 98 | Ht 64.0 in | Wt 105.0 lb

## 2022-09-05 DIAGNOSIS — G8929 Other chronic pain: Secondary | ICD-10-CM

## 2022-09-05 DIAGNOSIS — M1711 Unilateral primary osteoarthritis, right knee: Secondary | ICD-10-CM

## 2022-09-05 DIAGNOSIS — M25561 Pain in right knee: Secondary | ICD-10-CM | POA: Diagnosis not present

## 2022-09-05 NOTE — Patient Instructions (Addendum)
Thank you for coming in today.   We will prepare for Zilretta injection.   We can get Duralane injeciton approved again starting on Dec 7th.   Recheck as needed.

## 2022-09-05 NOTE — Progress Notes (Signed)
   I, Peterson Lombard, LAT, ATC acting as a scribe for Lynne Leader, MD.  Dana [name] is a 87 y.o. female who presents to [place] at [facility] today for cont'd R knee pain. Pt was last seen by Dr. Glennon Mac on 05/30/22 and was given a Durolane injection in her R knee and advised to use Voltaren gel and take 500mg  of Tylenol tid. Dr. Georgina Snell has seen this pt in 2020-21 for this issue. Today, pt reports the 1st time she had a Durolane, on 11/28/21, she had much relief, but the repeat on 6/7 provided no relief. Pt is wondering if she can have a steroid injection to get some pain relief.   Dx imaging: 12/21/19 R knee XR  Pertinent review of systems: No fevers or chills  Relevant historical information: Lung cancer.   Exam:  BP (!) 148/76   Pulse 98   Ht 5\' 4"  (1.626 m)   Wt 105 lb (47.6 kg)   SpO2 97%   BMI 18.02 kg/m  General: Well Developed, well nourished, and in no acute distress.   MSK: Right knee mild effusion normal motion with crepitation.    Lab and Radiology Results  Procedure: Real-time Ultrasound Guided Injection of right knee superior lateral patellar space Device: Philips Affiniti 50G Images permanently stored and available for review in PACS Verbal informed consent obtained.  Discussed risks and benefits of procedure. Warned about infection, bleeding, hyperglycemia damage to structures among others. Patient expresses understanding and agreement Time-out conducted.   Noted no overlying erythema, induration, or other signs of local infection.   Skin prepped in a sterile fashion.   Local anesthesia: Topical Ethyl chloride.   With sterile technique and under real time ultrasound guidance: 40 mg of Kenalog and 2 mL of Marcaine injected into the knee joint. Fluid seen entering the joint capsule.   Completed without difficulty   Pain immediately resolved suggesting accurate placement of the medication.   Advised to call if fevers/chills, erythema,  induration, drainage, or persistent bleeding.   Images permanently stored and available for review in the ultrasound unit.  Impression: Technically successful ultrasound guided injection.         Assessment and Plan: 87 y.o. female with right knee pain thought to be due to exacerbation of underlying DJD.  She has advanced arthritis seen on x-ray from 2020.  Plan for steroid injection today as hyaluronic acid injection in June did not help as much as we would like.  We will work on authorization for [employer] injection which she can pursue as soon as this cortisone shot wears off.  From an orthopedic perspective she would benefit from a knee replacement however I am worried about her underlying functional status.  Check back as needed.   PDMP not reviewed this encounter. Orders Placed This Encounter  Procedures   [place] LIMITED JOINT SPACE STRUCTURES LOW RIGHT(NO LINKED CHARGES)    Order Specific Question:   Reason for Exam (SYMPTOM  OR DIAGNOSIS REQUIRED)    Answer:   right knee pain    Order Specific Question:   Preferred imaging location?    Answer:   [place]   No orders of the defined types were placed in this encounter.    Discussed warning signs or symptoms. Please see discharge instructions. Patient expresses understanding.   The above documentation has been reviewed and is accurate and complete Lynne Leader, M.D.

## 2022-09-06 ENCOUNTER — Ambulatory Visit: Payer: Medicare Other | Admitting: Sports Medicine

## 2022-09-13 ENCOUNTER — Telehealth: Payer: Self-pay

## 2022-09-13 NOTE — Patient Instructions (Signed)
Visit Information  Thank you for taking time to speak with me today Charlett Nose! Please do not hesitate to call if you require assistance coordinating care for Mrs. Dana [name].  Following are the goals we discussed today:   Goals Addressed             This Visit's Progress    COMPLETED: Care Coordination Activities       Care Coordination Interventions: Reviewed medication and treatment plans. Daughter reports patient is doing well. Denies current concerns r/t medication management or prescription cost. Family contacts updated in EMR. Daughter reports Advance Directive has been completed and notarized. Documents will be provided to provider's office for download to patient's medical record. Assessed social determinant of health barriers. AWV up to date. Patient will complete next AWV in December 2023.        Outreach was conducted today with Mrs. Dana [name]' caregiver/daughter Charlett Nose. Verbalized understanding of the information discussed. Declined need for instructions or resources.   Charlett Nose will call if care coordination assistance is required.  [place] [facility] [phone]

## 2022-09-13 NOTE — Patient Outreach (Signed)
  Care Coordination   Initial Visit Note   09/13/2022 Name: Dana [name] MRN: [id number] DOB: January 14, 1935  Dana [name] is a 87 y.o. year old female who sees [name], MD for primary care. I spoke with  [place] caregiver/daughter Charlett Nose by phone today.  What matters to the patients health and wellness today?  No Concerns Expressed    Goals Addressed             This Visit's Progress    COMPLETED: Care Coordination Activities       Care Coordination Interventions: Reviewed medication and treatment plans. Daughter reports patient is doing well. Denies current concerns r/t medication management or prescription cost. Family contacts updated in EMR. Daughter reports Advance Directive has been completed and notarized. Documents will be provided to provider's office for download to patient's medical record. Assessed social determinant of health barriers. AWV up to date. Patient will complete next AWV in December 2023.        SDOH assessments and interventions completed:  Yes  SDOH Interventions Today    Flowsheet Row Most Recent Value  SDOH Interventions   Food Insecurity Interventions Intervention Not Indicated  Transportation Interventions Intervention Not Indicated        Care Coordination Interventions Activated:  Yes  Care Coordination Interventions:  Yes, provided   Follow up plan: No further intervention required.   Encounter Outcome:  Pt. Visit Completed   [place] [facility] [phone]

## 2022-09-20 ENCOUNTER — Ambulatory Visit: Payer: Self-pay

## 2022-09-20 ENCOUNTER — Ambulatory Visit (INDEPENDENT_AMBULATORY_CARE_PROVIDER_SITE_OTHER): Payer: Medicare Other | Admitting: Family Medicine

## 2022-09-20 DIAGNOSIS — M1711 Unilateral primary osteoarthritis, right knee: Secondary | ICD-10-CM

## 2022-09-20 DIAGNOSIS — M25561 Pain in right knee: Secondary | ICD-10-CM | POA: Diagnosis not present

## 2022-09-20 DIAGNOSIS — G8929 Other chronic pain: Secondary | ICD-10-CM

## 2022-09-20 MED ORDER — TRIAMCINOLONE ACETONIDE 32 MG IX SRER
32.0000 mg | Freq: Once | INTRA_ARTICULAR | Status: AC
  Administered 2022-09-20: 32 mg via INTRA_ARTICULAR

## 2022-09-20 NOTE — Progress Notes (Signed)
   I, Peterson Lombard, LAT, ATC acting as a scribe for Lynne Leader, MD.  Dana [name] is a 87 y.o. female who presents to [place] at [facility] today for cont'd R knee pain. Pt was seen by Dr. Glennon Mac on 05/30/22 and was given a Durolane injection in her R knee. Pt was last seen by Dr. Georgina Snell on 09/05/22 and was given a R knee steroid injection and was advised we would work on authorization of gel shots and Zilretta. Today, pt reports severe knee pain.  Prior conventional steroid injection did not help much at all.  Dx imaging: 12/21/19 R knee XR  Pertinent review of systems: No fevers or chills  Relevant historical information: Lung cancer   Exam:   General: Well Developed, well nourished, and in no acute distress.   MSK: Right knee mild effusion genu valgus appearance decreased range of motion with crepitation.    Lab and Radiology Results  Procedure: Real-time Ultrasound Guided Injection of right knee superior lateral patellar space Device: Philips Affiniti 50G Images permanently stored and available for review in PACS Verbal informed consent obtained.  Discussed risks and benefits of procedure. Warned about infection, bleeding, hyperglycemia damage to structures among others. Patient expresses understanding and agreement Time-out conducted.   Noted no overlying erythema, induration, or other signs of local infection.   Skin prepped in a sterile fashion.   Local anesthesia: Topical Ethyl chloride.   With sterile technique and under real time ultrasound guidance: Zilretta 32 mg injected into knee joint. Fluid seen entering the joint capsule.   Completed without difficulty   Advised to call if fevers/chills, erythema, induration, drainage, or persistent bleeding.   Images permanently stored and available for review in the ultrasound unit.  Impression: Technically successful ultrasound guided injection. Lot number: [id number]        Assessment and Plan: 87  y.o. female with right knee pain thought to be due to DJD exacerbation.  She is failing typical conservative management.  Plan for Zilretta injection today.  If this does not provide lasting relief would recommend geniculate nerve ablation.  She is eligible for repeat hyaluronic acid injection mid December.  Would like to avoid total knee replacement on her given her overall health but is not an impossibility if nothing else works.   PDMP not reviewed this encounter. Orders Placed This Encounter  Procedures   [place] LIMITED JOINT SPACE STRUCTURES LOW RIGHT(NO LINKED CHARGES)    Order Specific Question:   Reason for Exam (SYMPTOM  OR DIAGNOSIS REQUIRED)    Answer:   rt knee injection    Order Specific Question:   Preferred imaging location?    Answer:   [place]   Meds ordered this encounter  Medications   Triamcinolone Acetonide (ZILRETTA) intra-articular injection 32 mg     Discussed warning signs or symptoms. Please see discharge instructions. Patient expresses understanding.   The above documentation has been reviewed and is accurate and complete Lynne Leader, M.D.

## 2022-09-20 NOTE — Patient Instructions (Signed)
Thank you for coming in today.   Call or go to the ER if you develop a large red swollen joint with extreme pain or oozing puss.    Let me know if you are not doing well.

## 2022-10-03 DIAGNOSIS — M25641 Stiffness of right hand, not elsewhere classified: Secondary | ICD-10-CM | POA: Diagnosis not present

## 2022-10-03 DIAGNOSIS — Z789 Other specified health status: Secondary | ICD-10-CM | POA: Diagnosis not present

## 2022-10-03 DIAGNOSIS — L98492 Non-pressure chronic ulcer of skin of other sites with fat layer exposed: Secondary | ICD-10-CM | POA: Diagnosis not present

## 2022-10-04 DIAGNOSIS — Z23 Encounter for immunization: Secondary | ICD-10-CM | POA: Diagnosis not present

## 2022-10-10 ENCOUNTER — Emergency Department (HOSPITAL_BASED_OUTPATIENT_CLINIC_OR_DEPARTMENT_OTHER): Payer: Medicare Other

## 2022-10-10 ENCOUNTER — Other Ambulatory Visit: Payer: Self-pay

## 2022-10-10 ENCOUNTER — Emergency Department (HOSPITAL_BASED_OUTPATIENT_CLINIC_OR_DEPARTMENT_OTHER)
Admission: EM | Admit: 2022-10-10 | Discharge: 2022-10-10 | Disposition: A | Discharge status: Home / Self Care | Payer: Medicare Other | Attending: Emergency Medicine | Admitting: Emergency Medicine

## 2022-10-10 ENCOUNTER — Encounter (HOSPITAL_BASED_OUTPATIENT_CLINIC_OR_DEPARTMENT_OTHER): Payer: Self-pay

## 2022-10-10 DIAGNOSIS — I10 Essential (primary) hypertension: Secondary | ICD-10-CM | POA: Diagnosis not present

## 2022-10-10 DIAGNOSIS — X58XXXA Exposure to other specified factors, initial encounter: Secondary | ICD-10-CM | POA: Insufficient documentation

## 2022-10-10 DIAGNOSIS — S81811A Laceration without foreign body, right lower leg, initial encounter: Secondary | ICD-10-CM | POA: Insufficient documentation

## 2022-10-10 DIAGNOSIS — R6889 Other general symptoms and signs: Secondary | ICD-10-CM | POA: Diagnosis not present

## 2022-10-10 DIAGNOSIS — J449 Chronic obstructive pulmonary disease, unspecified: Secondary | ICD-10-CM | POA: Insufficient documentation

## 2022-10-10 DIAGNOSIS — R404 Transient alteration of awareness: Secondary | ICD-10-CM | POA: Diagnosis not present

## 2022-10-10 DIAGNOSIS — S61412A Laceration without foreign body of left hand, initial encounter: Secondary | ICD-10-CM | POA: Insufficient documentation

## 2022-10-10 DIAGNOSIS — Z743 Need for continuous supervision: Secondary | ICD-10-CM | POA: Diagnosis not present

## 2022-10-10 DIAGNOSIS — S81812A Laceration without foreign body, left lower leg, initial encounter: Secondary | ICD-10-CM | POA: Insufficient documentation

## 2022-10-10 DIAGNOSIS — S8992XA Unspecified injury of left lower leg, initial encounter: Secondary | ICD-10-CM | POA: Diagnosis present

## 2022-10-10 DIAGNOSIS — Y92512 Supermarket, store or market as the place of occurrence of the external cause: Secondary | ICD-10-CM | POA: Diagnosis not present

## 2022-10-10 DIAGNOSIS — Z1152 Encounter for screening for COVID-19: Secondary | ICD-10-CM | POA: Insufficient documentation

## 2022-10-10 DIAGNOSIS — R531 Weakness: Secondary | ICD-10-CM | POA: Diagnosis not present

## 2022-10-10 DIAGNOSIS — R42 Dizziness and giddiness: Secondary | ICD-10-CM | POA: Diagnosis not present

## 2022-10-10 DIAGNOSIS — R55 Syncope and collapse: Secondary | ICD-10-CM | POA: Insufficient documentation

## 2022-10-10 DIAGNOSIS — M47812 Spondylosis without myelopathy or radiculopathy, cervical region: Secondary | ICD-10-CM | POA: Diagnosis not present

## 2022-10-10 LAB — URINALYSIS, ROUTINE W REFLEX MICROSCOPIC
Bilirubin Urine: NEGATIVE
Glucose, UA: NEGATIVE mg/dL
Ketones, ur: NEGATIVE mg/dL
Leukocytes,Ua: NEGATIVE
Nitrite: NEGATIVE
Protein, ur: NEGATIVE mg/dL
Specific Gravity, Urine: 1.015 (ref 1.005–1.030)
pH: 7 (ref 5.0–8.0)

## 2022-10-10 LAB — CBC WITH DIFFERENTIAL/PLATELET
Abs Immature Granulocytes: 0.03 10*3/uL (ref 0.00–0.07)
Basophils Absolute: 0 10*3/uL (ref 0.0–0.1)
Basophils Relative: 0 %
Eosinophils Absolute: 0.1 10*3/uL (ref 0.0–0.5)
Eosinophils Relative: 2 %
HCT: 39.3 % (ref 36.0–46.0)
Hemoglobin: 13.4 g/dL (ref 12.0–15.0)
Immature Granulocytes: 1 %
Lymphocytes Relative: 9 %
Lymphs Abs: 0.6 10*3/uL — ABNORMAL LOW (ref 0.7–4.0)
MCH: 33 pg (ref 26.0–34.0)
MCHC: 34.1 g/dL (ref 30.0–36.0)
MCV: 96.8 fL (ref 80.0–100.0)
Monocytes Absolute: 0.5 10*3/uL (ref 0.1–1.0)
Monocytes Relative: 8 %
Neutro Abs: 5 10*3/uL (ref 1.7–7.7)
Neutrophils Relative %: 80 %
Platelets: 194 10*3/uL (ref 150–400)
RBC: 4.06 MIL/uL (ref 3.87–5.11)
RDW: 15.1 % (ref 11.5–15.5)
WBC: 6.1 10*3/uL (ref 4.0–10.5)
nRBC: 0 % (ref 0.0–0.2)

## 2022-10-10 LAB — RESP PANEL BY RT-PCR (FLU A&B, COVID) ARPGX2
Influenza A by PCR: NEGATIVE
Influenza B by PCR: NEGATIVE
SARS Coronavirus 2 by RT PCR: NEGATIVE

## 2022-10-10 LAB — COMPREHENSIVE METABOLIC PANEL
ALT: 28 U/L (ref 0–44)
AST: 30 U/L (ref 15–41)
Albumin: 3.5 g/dL (ref 3.5–5.0)
Alkaline Phosphatase: 40 U/L (ref 38–126)
Anion gap: 7 (ref 5–15)
BUN: 27 mg/dL — ABNORMAL HIGH (ref 8–23)
CO2: 27 mmol/L (ref 22–32)
Calcium: 9.4 mg/dL (ref 8.9–10.3)
Chloride: 106 mmol/L (ref 98–111)
Creatinine, Ser: 0.98 mg/dL (ref 0.44–1.00)
GFR, Estimated: 57 mL/min — ABNORMAL LOW (ref >60)
Glucose, Bld: 91 mg/dL (ref 70–99)
Potassium: 3.9 mmol/L (ref 3.5–5.1)
Sodium: 140 mmol/L (ref 135–145)
Total Bilirubin: 1.5 mg/dL — ABNORMAL HIGH (ref 0.3–1.2)
Total Protein: 6 g/dL — ABNORMAL LOW (ref 6.5–8.1)

## 2022-10-10 LAB — TROPONIN I (HIGH SENSITIVITY)
Troponin I (High Sensitivity): 24 ng/L — ABNORMAL HIGH (ref <18)
Troponin I (High Sensitivity): 26 ng/L — ABNORMAL HIGH (ref <18)

## 2022-10-10 LAB — URINALYSIS, MICROSCOPIC (REFLEX)

## 2022-10-10 LAB — LIPASE, BLOOD: Lipase: 39 U/L (ref 11–51)

## 2022-10-10 MED ORDER — SODIUM CHLORIDE 0.9 % IV BOLUS
500.0000 mL | Freq: Once | INTRAVENOUS | Status: AC
Start: 2022-10-10 — End: 2022-10-10
  Administered 2022-10-10: 500 mL via INTRAVENOUS

## 2022-10-10 NOTE — ED Notes (Signed)
Patient transported to CT 

## 2022-10-10 NOTE — ED Provider Notes (Signed)
Emergency Department Provider Note   I have reviewed the triage vital signs and the nursing notes.   HISTORY  Chief Complaint Loss of Consciousness   HPI TRINITIE MCGIRR is a 87 y.o. female with past history reviewed below including lung cancer on oral chemotherapy presents emergency department with syncope.  Patient states that she has had several episodes of syncope recently without clear provoking factors.  Today, she was at the grocery store, standing in line to check out, when she suddenly felt like she was "being drained."  She denies any sudden flush sensation.  No chest pain, palpitations, shortness of breath, headache. She remembers she woke up on the ground with her shopping cart on top of her.  She does not think that she struck her head but does not remember events immediately surrounding the episode.  She continues to feel generally weak.  No associated numbness.  She arrived by EMS who gave 300 mL IV fluid in route.    Past Medical History:  Diagnosis Date   Arthritis    HANDS,  WRISTS   COPD (chronic obstructive pulmonary disease) ([place])    Depression 04/04/2017   Dyspnea on exertion    Encounter for therapeutic drug monitoring 10/01/2016   GERD (gastroesophageal reflux disease)    at times   Hemorrhoid    History of anal fissures    History of kidney stones    History of lung cancer ONCOLOGIST--  DR [name]--  LAST CT ,  NO RECURRENCE OR METS   DX JAN 2010 --  STAGE IIIA  NON-SMALL CELL ADENOCARCINOMA (RIGHT MIDDLE LOBE)---  S/P  CHEMORADIATIO THERAPY  (COMPLETE 03-21-2009)   HTN (hypertension) 10/10/2017   white coat syndrome   Imbalance 06/04/2017   lung ca dx'd 2010   Normal cardiac stress test    2007  PER PT   Osteoporosis    Rash, skin    RIGHT FOREARM/ HAND   Right wrist pain    MASS   Wears glasses     Review of Systems  Constitutional: No fever/chills. Positive generalized weakness.  Cardiovascular: Denies chest pain. Positive syncope.   Respiratory: Denies shortness of breath. Gastrointestinal: No abdominal pain.  No nausea, no vomiting.  No diarrhea.  No constipation. Genitourinary: Negative for dysuria. Musculoskeletal: Negative for back pain. Skin: Skin tears to the left hand and the bilateral shins. Neurological: Negative for headaches, focal weakness or numbness.   ____________________________________________   PHYSICAL EXAM:  VITAL SIGNS: ED Triage Vitals  Enc Vitals Group     BP 10/10/22 1052 117/79     Pulse Rate 10/10/22 1052 89     Resp 10/10/22 1052 18     Temp 10/10/22 1052 98.2 F (36.8 C)     Temp src --      SpO2 10/10/22 1052 99 %     Weight 10/10/22 1054 100 lb 12.8 oz (45.7 kg)     Height 10/10/22 1054 5\' 3"  (1.6 m)    Constitutional: Alert and oriented. Well appearing and in no acute distress. Eyes: Conjunctivae are normal. Head: Atraumatic. Nose: No congestion/rhinnorhea. Mouth/Throat: Mucous membranes are moist.  Neck: No stridor.  No cervical spine tenderness to palpation. Cardiovascular: Normal rate, regular rhythm. Good peripheral circulation. Grossly normal heart sounds.   Respiratory: Normal respiratory effort.  No retractions. Lungs CTAB. Gastrointestinal: Soft and nontender. No distention.  Musculoskeletal: No lower extremity tenderness nor edema. No gross deformities of extremities. Normal ROM of the bilateral upper/lower extremities. No scaphoid  tenderness.  Neurologic:  Normal speech and language. No gross focal neurologic deficits are appreciated.  Skin:  Skin is warm a dry.  Multiple skin tears to the palmar aspect of the left hand and bilateral lower shins. No deeper laceration. Minimal active bleeding.   ____________________________________________   LABS (all labs ordered are listed, but only abnormal results are displayed)  Labs Reviewed  URINE CULTURE  RESP PANEL BY RT-PCR (FLU A&B, COVID) ARPGX2  COMPREHENSIVE METABOLIC PANEL  CBC WITH DIFFERENTIAL/PLATELET   URINALYSIS, ROUTINE W REFLEX MICROSCOPIC  LIPASE, BLOOD  TROPONIN I (HIGH SENSITIVITY)   ____________________________________________  EKG   EKG Interpretation  Date/Time:  Wednesday October 10 2022 10:57:59 EDT Ventricular Rate:  92 PR Interval:  133 QRS Duration: 73 QT Interval:  338 QTC Calculation: 419 R Axis:   -26 Text Interpretation: Sinus rhythm Atrial premature complex Borderline left axis deviation Anteroseptal infarct, age indeterminate Confirmed by Nanda Quinton [phone]) on 10/10/2022 10:59:54 AM        ____________________________________________  RADIOLOGY  No results found.  ____________________________________________   PROCEDURES  Procedure(s) performed:   Procedures   ____________________________________________   INITIAL IMPRESSION / ASSESSMENT AND PLAN / ED COURSE  Pertinent labs & imaging results that were available during my care of the patient were reviewed by me and considered in my medical decision making (see chart for details).   This patient is Presenting for Evaluation of syncope, which does require a range of treatment options, and is a complaint that involves a high risk of morbidity and mortality.  The Differential Diagnoses include arrhythmia, PE, ACS, dehydration, developing infection, med side effect, etc.  Critical Interventions-    Medications  sodium chloride 0.9 % bolus 500 mL (has no administration in time range)    Reassessment after intervention:     I did obtain Additional Historical Information from EMS and husband at bedside.  I decided to review pertinent External Data, and in summary UC visit in August for head injury and syncope.    Clinical Laboratory Tests Ordered, included ***  Radiologic Tests Ordered, included ***. I independently interpreted the images and agree with radiology interpretation.   Cardiac Monitor Tracing which shows NSR.    Social Determinants of Health Risk patient is a  non-smoker.   Consult complete with  Medical Decision Making: Summary:  Patient presents emergency department evaluation after syncope with fairly minimal prodrome.  No active symptoms currently other than generalized weakness.  Skin tears to the left hand and bilateral shins.  Plan to clean and dress these and allow them to heal by secondary intention.   Reevaluation with update and discussion with   ***Considered admission***  Disposition:   ____________________________________________  FINAL CLINICAL IMPRESSION(S) / ED DIAGNOSES  Final diagnoses:  None     NEW OUTPATIENT MEDICATIONS STARTED DURING THIS VISIT:  New Prescriptions   No medications on file    Note:  This document was prepared using Dragon voice recognition software and may include unintentional dictation errors.  Nanda Quinton, MD, [facility] Emergency Medicine

## 2022-10-10 NOTE — Discharge Instructions (Signed)
You have been seen today in the Emergency Department (ED)  for syncope (passing out).  Your workup including labs and EKG show reassuring results.  Your symptoms may be due to dehydration, so it is important that you drink plenty of non-alcoholic fluids.  Please call your regular doctor as soon as possible to schedule the next available clinic appointment to follow up with him/her regarding your visit to the ED and your symptoms.  Return to the Emergency Department (ED)  if you have any further syncopal episodes (pass out again) or develop ANY chest pain, pressure, tightness, trouble breathing, sudden sweating, or other symptoms that concern you.

## 2022-10-10 NOTE — ED Triage Notes (Signed)
BIB EMS from grocery store after syncopal episode while standing in line. States didn't drink much water today. Was holding the shopping cart which flipped over on her during syncopal episode. Denies neck pain/headache. Skin tears to left hand and bilateral shins. Tetanus utd.   Lung cancer patient, on oral chemo.

## 2022-10-11 DIAGNOSIS — I951 Orthostatic hypotension: Secondary | ICD-10-CM | POA: Diagnosis not present

## 2022-10-11 DIAGNOSIS — M6281 Muscle weakness (generalized): Secondary | ICD-10-CM | POA: Diagnosis not present

## 2022-10-11 DIAGNOSIS — R2681 Unsteadiness on feet: Secondary | ICD-10-CM | POA: Diagnosis not present

## 2022-10-11 DIAGNOSIS — R55 Syncope and collapse: Secondary | ICD-10-CM | POA: Diagnosis not present

## 2022-10-11 DIAGNOSIS — R54 Age-related physical debility: Secondary | ICD-10-CM | POA: Diagnosis not present

## 2022-10-11 DIAGNOSIS — C3490 Malignant neoplasm of unspecified part of unspecified bronchus or lung: Secondary | ICD-10-CM | POA: Diagnosis not present

## 2022-10-11 DIAGNOSIS — J449 Chronic obstructive pulmonary disease, unspecified: Secondary | ICD-10-CM | POA: Diagnosis not present

## 2022-10-11 DIAGNOSIS — R296 Repeated falls: Secondary | ICD-10-CM | POA: Diagnosis not present

## 2022-10-11 LAB — URINE CULTURE: Culture: NO GROWTH

## 2022-10-17 ENCOUNTER — Inpatient Hospital Stay: Payer: Medicare Other | Attending: Internal Medicine | Admitting: Internal Medicine

## 2022-10-17 ENCOUNTER — Inpatient Hospital Stay: Payer: Medicare Other

## 2022-10-17 ENCOUNTER — Other Ambulatory Visit: Payer: Self-pay

## 2022-10-17 VITALS — BP 135/81 | HR 101 | Resp 18 | Ht 61.0 in | Wt 103.5 lb

## 2022-10-17 DIAGNOSIS — F32A Depression, unspecified: Secondary | ICD-10-CM | POA: Diagnosis not present

## 2022-10-17 DIAGNOSIS — C3491 Malignant neoplasm of unspecified part of right bronchus or lung: Secondary | ICD-10-CM

## 2022-10-17 DIAGNOSIS — G4700 Insomnia, unspecified: Secondary | ICD-10-CM | POA: Insufficient documentation

## 2022-10-17 DIAGNOSIS — C3492 Malignant neoplasm of unspecified part of left bronchus or lung: Secondary | ICD-10-CM | POA: Diagnosis not present

## 2022-10-17 DIAGNOSIS — E860 Dehydration: Secondary | ICD-10-CM | POA: Insufficient documentation

## 2022-10-17 DIAGNOSIS — C3490 Malignant neoplasm of unspecified part of unspecified bronchus or lung: Secondary | ICD-10-CM

## 2022-10-17 DIAGNOSIS — Z79899 Other long term (current) drug therapy: Secondary | ICD-10-CM | POA: Diagnosis not present

## 2022-10-17 LAB — CMP (CANCER CENTER ONLY)
ALT: 26 U/L (ref 0–44)
AST: 27 U/L (ref 15–41)
Albumin: 3.3 g/dL — ABNORMAL LOW (ref 3.5–5.0)
Alkaline Phosphatase: 35 U/L — ABNORMAL LOW (ref 38–126)
Anion gap: 9 (ref 5–15)
BUN: 40 mg/dL — ABNORMAL HIGH (ref 8–23)
CO2: 27 mmol/L (ref 22–32)
Calcium: 9.4 mg/dL (ref 8.9–10.3)
Chloride: 106 mmol/L (ref 98–111)
Creatinine: 1.17 mg/dL — ABNORMAL HIGH (ref 0.44–1.00)
GFR, Estimated: 46 mL/min — ABNORMAL LOW (ref >60)
Glucose, Bld: 99 mg/dL (ref 70–99)
Potassium: 3.9 mmol/L (ref 3.5–5.1)
Sodium: 142 mmol/L (ref 135–145)
Total Bilirubin: 0.8 mg/dL (ref 0.3–1.2)
Total Protein: 5.9 g/dL — ABNORMAL LOW (ref 6.5–8.1)

## 2022-10-17 LAB — CBC WITH DIFFERENTIAL (CANCER CENTER ONLY)
Abs Immature Granulocytes: 0.01 10*3/uL (ref 0.00–0.07)
Basophils Absolute: 0 10*3/uL (ref 0.0–0.1)
Basophils Relative: 0 %
Eosinophils Absolute: 0.2 10*3/uL (ref 0.0–0.5)
Eosinophils Relative: 2 %
HCT: 36.6 % (ref 36.0–46.0)
Hemoglobin: 12.7 g/dL (ref 12.0–15.0)
Immature Granulocytes: 0 %
Lymphocytes Relative: 11 %
Lymphs Abs: 0.7 10*3/uL (ref 0.7–4.0)
MCH: 33.3 pg (ref 26.0–34.0)
MCHC: 34.7 g/dL (ref 30.0–36.0)
MCV: 96.1 fL (ref 80.0–100.0)
Monocytes Absolute: 0.8 10*3/uL (ref 0.1–1.0)
Monocytes Relative: 11 %
Neutro Abs: 5 10*3/uL (ref 1.7–7.7)
Neutrophils Relative %: 76 %
Platelet Count: 237 10*3/uL (ref 150–400)
RBC: 3.81 MIL/uL — ABNORMAL LOW (ref 3.87–5.11)
RDW: 15.2 % (ref 11.5–15.5)
WBC Count: 6.6 10*3/uL (ref 4.0–10.5)
nRBC: 0 % (ref 0.0–0.2)

## 2022-10-17 NOTE — Progress Notes (Signed)
[facility] [phone]   [phone]  OFFICE PROGRESS NOTE  [name], MD [place] [address]  DIAGNOSIS: Stage IIIB (T2a, N3, M0) non-small cell lung cancer, adenocarcinoma with positive EGFR mutation diagnosed in January 2010.  PRIOR THERAPY: 1) Status post concurrent chemoradiation with weekly carboplatin and paclitaxel; last dose given March 21, 2009.  2) Tarceva at 150 mg p.o. daily, status post approximately 48 months of treatment, discontinued secondary to persistent diarrhea.  3) status post right Pleurx catheter placement for right nonmalignant pleural effusion.  CURRENT THERAPY: Tarceva 100 mg by mouth daily started 03/19/2013, status post 115 months of treatment.  INTERVAL HISTORY: [name] 87 y.o. female returns to the clinic today for 3 months follow-up visit.  The patient is feeling fine today with no concerning complaints except for mild fatigue.  She had a fall at the [employer] a week ago.  She was evaluated at the emergency department and had several studies performed including scan of the neck and head that were unremarkable.  She was diagnosed with dehydration and she received IV hydration during her stay.  She has been drinking more fluid recently.  She has no current chest pain, shortness of breath, cough or hemoptysis.  She has no nausea, vomiting, diarrhea or constipation.  She has no headache or visual changes.  She has no recent weight loss or night sweats.  She is here today for evaluation and repeat blood work.    MEDICAL HISTORY: Past Medical History:  Diagnosis Date   Arthritis    HANDS,  WRISTS   COPD (chronic obstructive pulmonary disease) ([place])    Depression 04/04/2017   Dyspnea on exertion    Encounter for therapeutic drug monitoring 10/01/2016   GERD (gastroesophageal reflux disease)    at times   Hemorrhoid    History of anal fissures    History of kidney stones     History of lung cancer ONCOLOGIST--  DR [name]--  LAST CT ,  NO RECURRENCE OR METS   DX JAN 2010 --  STAGE IIIA  NON-SMALL CELL ADENOCARCINOMA (RIGHT MIDDLE LOBE)---  S/P  CHEMORADIATIO THERAPY  (COMPLETE 03-21-2009)   HTN (hypertension) 10/10/2017   white coat syndrome   Imbalance 06/04/2017   lung ca dx'd 2010   Normal cardiac stress test    2007  PER PT   Osteoporosis    Rash, skin    RIGHT FOREARM/ HAND   Right wrist pain    MASS   Wears glasses     ALLERGIES:  is allergic to methotrexate, clindamycin hcl, amoxicillin, and protonix [pantoprazole sodium].  MEDICATIONS:  Current Outpatient Medications  Medication Sig Dispense Refill   antiseptic oral rinse (BIOTENE) LIQD 15 mLs by Mouth Rinse route as needed for dry mouth.     augmented betamethasone dipropionate (DIPROLENE-AF) 0.05 % cream Apply topically 2 (two) times daily.     b complex vitamins capsule Take 1 capsule by mouth daily.     bismuth subsalicylate (PEPTO BISMOL) 262 MG/15ML suspension Take 30 mLs by mouth every three (3) days as needed.     Calcium-Vitamin D ([employer] CALCIUM-600/VIT D PO) Take 2 tablets by mouth daily.     cephALEXin (KEFLEX) 250 MG capsule Take 1 capsule (250 mg total) by mouth 3 (three) times daily. 21 capsule 0   cholecalciferol (VITAMIN D) 1000 UNITS tablet Take 2,000 Units by mouth daily.     erlotinib (  TARCEVA) 100 MG tablet Take 1 tablet (100 mg total) by mouth daily. 30 tablet 3   hydrocortisone 2.5 % cream Apply 1 application. topically 2 (two) times daily.     lactase (LACTAID) 3000 units tablet Take 3,000 Units by mouth daily.     loratadine (CLARITIN) 10 MG tablet Take 10 mg by mouth daily as needed for allergies.     Misc Natural Products (OSTEO BI-FLEX JOINT SHIELD PO) Take 2 tablets by mouth daily.     Polyethyl Glycol-Propyl Glycol (SYSTANE OP) Place 1 drop into both eyes at bedtime as needed (for dry eyes.).     Simethicone (GAS-X PO) Take 1 tablet by mouth 2 (two) times daily as  needed (for gas).      Tiotropium Bromide-Olodaterol (STIOLTO RESPIMAT) 2.5-2.5 MCG/ACT AERS INHALE 2 PUFFS BY MOUTH ONCE DAILY 4 g 0   vitamin B-12 (CYANOCOBALAMIN) 1000 MCG tablet Take 1 tablet by mouth daily.     No current facility-administered medications for this visit.    SURGICAL HISTORY:  Past Surgical History:  Procedure Laterality Date   ANAL FISSURE REPAIR  07/09/2011   INTERNAL SPHINCTEROTOMY   BENIGN EXCISION LEFT BREAST CENTRAL DUCT  02/1999   BREAST BIOPSY  01/31/2009   benign   BREAST EXCISIONAL BIOPSY Left 2004   benign   CHEST TUBE INSERTION Right 07/14/2014   Procedure: INSERTION PLEURAL DRAINAGE CATHETER RIGHT CHEST;  Surgeon: Grace Isaac, MD;  Location: [facility];  Service: Thoracic;  Laterality: Right;   EXCISION RIGHT WRIST MASS  2012   EXTRACORPOREAL SHOCK WAVE LITHOTRIPSY Left 06/01/2019   Procedure: EXTRACORPOREAL SHOCK WAVE LITHOTRIPSY (ESWL);  Surgeon: Cleon Gustin, MD;  Location: [facility];  Service: Urology;  Laterality: Left;   KNEE ARTHROSCOPY Right 1999   MASS EXCISION Right 03/11/2014   Procedure: RIGHT WRIST DEEP MASS EXCISION WITH CULTURE AND BIOSPY;  Surgeon: Linna Hoff, MD;  Location: [facility];  Service: Orthopedics;  Laterality: Right;   MICROLARYNGOSCOPY W/VOCAL CORD INJECTION Bilateral 02/08/2021   Procedure: MICROLARYNGOSCOPY WITH VOCAL CORD INJECTION;  Surgeon: Melida Quitter, MD;  Location: [facility];  Service: ENT;  Laterality: Bilateral;   REMOVAL OF PLEURAL DRAINAGE CATHETER Right 10/14/2014   Procedure: REMOVAL OF PLEURAL DRAINAGE CATHETER;  Surgeon: Grace Isaac, MD;  Location: [facility];  Service: Thoracic;  Laterality: Right;   TALC PLEURODESIS Right 09/16/2014   Procedure: TALC PLEURADESIS/ slurry;  Surgeon: Grace Isaac, MD;  Location: [facility];  Service: Thoracic;  Laterality: Right;   THORACOSCOPY  01-26-2009   w/   lung/  node biopsy's   THUMB ARTHROSCOPY Left    - removed bone spur   TONSILLECTOMY  AS CHILD    TOTAL ABDOMINAL HYSTERECTOMY W/ BILATERAL SALPINGOOPHORECTOMY  1982   W/  APPENDECTOMY   TRANSTHORACIC ECHOCARDIOGRAM  12-23-2008   NORMAL LV/  EF 65-70%/  MILD MR  &  TR   VAULT SUSPENSION PLUS CYSTOCELE REPAIR WITH GRAFT  06-13-2010    REVIEW OF SYSTEMS:  A comprehensive review of systems was negative except for: Constitutional: positive for fatigue   PHYSICAL EXAMINATION: General appearance: alert, cooperative, fatigued, and no distress Head: Normocephalic, without obvious abnormality, atraumatic Neck: no adenopathy, no JVD, supple, symmetrical, trachea midline, and thyroid not enlarged, symmetric, no tenderness/mass/nodules Lymph nodes: Cervical, supraclavicular, and axillary nodes normal. Resp: clear to auscultation bilaterally Back: symmetric, no curvature. ROM normal. No CVA tenderness. Cardio: regular rate and rhythm, S1, S2 normal, no murmur, click, rub or gallop  GI: soft, non-tender; bowel sounds normal; no masses,  no organomegaly Extremities: extremities normal, atraumatic, no cyanosis or edema  ECOG PERFORMANCE STATUS: 1 - Symptomatic but completely ambulatory  Blood pressure 135/81, pulse (!) 101, resp. rate 18, height $RemoveBe'5\' 1"'dfhmORcua$  (1.549 m), weight 103 lb 8 oz (46.9 kg), SpO2 98 %.  LABORATORY DATA: Lab Results  Component Value Date   WBC 6.6 10/17/2022   HGB 12.7 10/17/2022   HCT 36.6 10/17/2022   MCV 96.1 10/17/2022   PLT 237 10/17/2022      Chemistry      Component Value Date/Time   NA 140 10/10/2022 1201   NA 142 12/10/2017 1000   K 3.9 10/10/2022 1201   K 4.0 12/10/2017 1000   CL 106 10/10/2022 1201   CL 108 (H) 06/03/2013 0939   CO2 27 10/10/2022 1201   CO2 28 12/10/2017 1000   BUN 27 (H) 10/10/2022 1201   BUN 20.4 12/10/2017 1000   CREATININE 0.98 10/10/2022 1201   CREATININE 0.90 07/16/2022 1011   CREATININE 0.9 12/10/2017 1000      Component Value Date/Time   CALCIUM 9.4 10/10/2022 1201   CALCIUM 9.7 12/10/2017 1000   ALKPHOS 40 10/10/2022 1201    ALKPHOS 63 12/10/2017 1000   AST 30 10/10/2022 1201   AST 28 07/16/2022 1011   AST 25 12/10/2017 1000   ALT 28 10/10/2022 1201   ALT 22 07/16/2022 1011   ALT 17 12/10/2017 1000   BILITOT 1.5 (H) 10/10/2022 1201   BILITOT 1.2 07/16/2022 1011   BILITOT 1.05 12/10/2017 1000       RADIOGRAPHIC STUDIES: [place] LIMITED JOINT SPACE STRUCTURES LOW RIGHT(NO LINKED CHARGES)  Result Date: 10/12/2022 Procedure: Real-time Ultrasound Guided Injection of right knee superior lateral patellar space Device: Philips Affiniti 50G Images permanently stored and available for review in PACS Verbal informed consent obtained.  Discussed risks and benefits of procedure. Warned about infection, bleeding, hyperglycemia damage to structures among others. Patient expresses understanding and agreement Time-out conducted.  Noted no overlying erythema, induration, or other signs of local infection.  Skin prepped in a sterile fashion.  Local anesthesia: Topical Ethyl chloride.  With sterile technique and under real time ultrasound guidance: Zilretta 32 mg injected into knee joint. Fluid seen entering the joint capsule.  Completed without difficulty  Advised to call if fevers/chills, erythema, induration, drainage, or persistent bleeding.  Images permanently stored and available for review in the ultrasound unit. Impression: Technically successful ultrasound guided injection. Lot number: [id number]   CT Head Wo Contrast  Result Date: 10/10/2022 CLINICAL DATA:  Syncope in grocery store. EXAM: CT HEAD WITHOUT CONTRAST CT CERVICAL SPINE WITHOUT CONTRAST TECHNIQUE: Multidetector CT imaging of the head and cervical spine was performed following the standard protocol without intravenous contrast. Multiplanar CT image reconstructions of the cervical spine were also generated. RADIATION DOSE REDUCTION: This exam was performed according to the departmental dose-optimization program which includes automated exposure control, adjustment of  the mA and/or kV according to patient size and/or use of iterative reconstruction technique. COMPARISON:  December 1st 2011 CT of the head and July of 2018 MRI of the brain. FINDINGS: CT HEAD FINDINGS Brain: No evidence of acute infarction, hemorrhage, hydrocephalus, extra-axial collection or mass lesion/mass effect. Signs of chronic microvascular ischemic change and atrophy that is increased since 2011 and since MRI of the brain of July 10, 2017 Vascular: No hyperdense vessel or unexpected calcification. Skull: Normal. Negative for fracture or focal lesion. Sinuses/Orbits: Visualized paranasal sinuses and orbits  are unremarkable to the extent evaluated. Other: None. CT CERVICAL SPINE FINDINGS Alignment: Degenerative changes and anterolisthesis at C4-5 approximately 3 mm anterolisthesis at this level associated with moderate to marked facet arthropathy and discogenic degenerative changes. 2 mm anterolisthesis of T1 on T2 incidentally noted also with facet arthropathy at this level. Skull base and vertebrae: No acute fracture. No primary bone lesion or focal pathologic process. Soft tissues and spinal canal: No prevertebral fluid or swelling. No visible canal hematoma. Disc levels: Multilevel degenerative changes in the spine greatest at C1-2, C4-5, C5-6 and C6-7. Disc space narrowing, osteophyte formation and uncovertebral spurring. Disc space narrowing is moderate to marked in the mid and lower cervical spine. Facet arthropathy also moderate to marked. Upper chest: Part solid nodule in the LEFT upper lobe with solid component measuring approximately 6 x 6 mm mm, this is increasing in density over a series of prior studies remaining concerning for bronchogenic neoplasm. Other: None IMPRESSION: 1. No acute intracranial abnormality. 2. Signs of chronic microvascular ischemic change and atrophy that is increased since 2011 and since MRI of the brain of July 10, 2017. 3. No evidence for acute fracture or traumatic  subluxation of the cervical spine. 4. Multilevel degenerative changes in the spine as described. 5. Part solid nodule in the LEFT upper lobe most suspicious for indolent bronchogenic neoplasm with increasing density over time. Could consider PET imaging on follow-up for further evaluation. Electronically Signed   By: Zetta Bills M.D.   On: 10/10/2022 12:18   CT Cervical Spine Wo Contrast  Result Date: 10/10/2022 CLINICAL DATA:  Syncope in grocery store. EXAM: CT HEAD WITHOUT CONTRAST CT CERVICAL SPINE WITHOUT CONTRAST TECHNIQUE: Multidetector CT imaging of the head and cervical spine was performed following the standard protocol without intravenous contrast. Multiplanar CT image reconstructions of the cervical spine were also generated. RADIATION DOSE REDUCTION: This exam was performed according to the departmental dose-optimization program which includes automated exposure control, adjustment of the mA and/or kV according to patient size and/or use of iterative reconstruction technique. COMPARISON:  December 1st 2011 CT of the head and July of 2018 MRI of the brain. FINDINGS: CT HEAD FINDINGS Brain: No evidence of acute infarction, hemorrhage, hydrocephalus, extra-axial collection or mass lesion/mass effect. Signs of chronic microvascular ischemic change and atrophy that is increased since 2011 and since MRI of the brain of July 10, 2017 Vascular: No hyperdense vessel or unexpected calcification. Skull: Normal. Negative for fracture or focal lesion. Sinuses/Orbits: Visualized paranasal sinuses and orbits are unremarkable to the extent evaluated. Other: None. CT CERVICAL SPINE FINDINGS Alignment: Degenerative changes and anterolisthesis at C4-5 approximately 3 mm anterolisthesis at this level associated with moderate to marked facet arthropathy and discogenic degenerative changes. 2 mm anterolisthesis of T1 on T2 incidentally noted also with facet arthropathy at this level. Skull base and vertebrae: No acute  fracture. No primary bone lesion or focal pathologic process. Soft tissues and spinal canal: No prevertebral fluid or swelling. No visible canal hematoma. Disc levels: Multilevel degenerative changes in the spine greatest at C1-2, C4-5, C5-6 and C6-7. Disc space narrowing, osteophyte formation and uncovertebral spurring. Disc space narrowing is moderate to marked in the mid and lower cervical spine. Facet arthropathy also moderate to marked. Upper chest: Part solid nodule in the LEFT upper lobe with solid component measuring approximately 6 x 6 mm mm, this is increasing in density over a series of prior studies remaining concerning for bronchogenic neoplasm. Other: None IMPRESSION: 1. No acute intracranial abnormality.  2. Signs of chronic microvascular ischemic change and atrophy that is increased since 2011 and since MRI of the brain of July 10, 2017. 3. No evidence for acute fracture or traumatic subluxation of the cervical spine. 4. Multilevel degenerative changes in the spine as described. 5. Part solid nodule in the LEFT upper lobe most suspicious for indolent bronchogenic neoplasm with increasing density over time. Could consider PET imaging on follow-up for further evaluation. Electronically Signed   By: Zetta Bills M.D.   On: 10/10/2022 12:18    ASSESSMENT AND PLAN:  This is a very pleasant 87 years old white female with stage IIIB non-small cell lung cancer, adenocarcinoma with positive EGFR mutation diagnosed in January 2010. She has been on treatment with Tarceva for more than 9 years.   She is currently on Tarceva 100 mg p.o. daily status post 115  months. The patient has been tolerating her treatment with Tarceva fairly well.  She had a fall recently but this was secondary to dehydration.  CBC today is unremarkable. I recommended for her to continue her current treatment with Tarceva with the same dose. I will see her back for follow-up visit in 3 months for evaluation and repeat CT scan of  the chest. For the insomnia and depression, she is currently on Remeron but her dose was reduced to 7.5 mg p.o. daily. The patient was advised to call immediately if she has any other concerning symptoms in the interval. The patient voices understanding of current disease status and treatment options and is in agreement with the current care plan. All questions were answered. The patient knows to call the clinic with any problems, questions or concerns. We can certainly see the patient much sooner if necessary. Disclaimer: This note was dictated with voice recognition software. Similar sounding words can inadvertently be transcribed and may not be corrected upon review.

## 2022-11-06 DIAGNOSIS — Z681 Body mass index (BMI) 19 or less, adult: Secondary | ICD-10-CM | POA: Diagnosis not present

## 2022-11-06 DIAGNOSIS — L98499 Non-pressure chronic ulcer of skin of other sites with unspecified severity: Secondary | ICD-10-CM | POA: Diagnosis not present

## 2022-11-06 DIAGNOSIS — M810 Age-related osteoporosis without current pathological fracture: Secondary | ICD-10-CM | POA: Diagnosis not present

## 2022-11-06 DIAGNOSIS — J449 Chronic obstructive pulmonary disease, unspecified: Secondary | ICD-10-CM | POA: Diagnosis not present

## 2022-11-06 DIAGNOSIS — Z9181 History of falling: Secondary | ICD-10-CM | POA: Diagnosis not present

## 2022-11-06 DIAGNOSIS — G8929 Other chronic pain: Secondary | ICD-10-CM | POA: Diagnosis not present

## 2022-11-06 DIAGNOSIS — S80812D Abrasion, left lower leg, subsequent encounter: Secondary | ICD-10-CM | POA: Diagnosis not present

## 2022-11-06 DIAGNOSIS — R2689 Other abnormalities of gait and mobility: Secondary | ICD-10-CM | POA: Diagnosis not present

## 2022-11-06 DIAGNOSIS — R682 Dry mouth, unspecified: Secondary | ICD-10-CM | POA: Diagnosis not present

## 2022-11-07 ENCOUNTER — Other Ambulatory Visit: Payer: Self-pay | Admitting: Family Medicine

## 2022-11-07 DIAGNOSIS — M810 Age-related osteoporosis without current pathological fracture: Secondary | ICD-10-CM

## 2022-11-21 ENCOUNTER — Other Ambulatory Visit: Payer: Self-pay | Admitting: Medical Oncology

## 2022-11-21 NOTE — Telephone Encounter (Signed)
Called in tarceva refill.  Dtr notified.

## 2022-11-28 DIAGNOSIS — L98492 Non-pressure chronic ulcer of skin of other sites with fat layer exposed: Secondary | ICD-10-CM | POA: Diagnosis not present

## 2022-11-28 DIAGNOSIS — Z789 Other specified health status: Secondary | ICD-10-CM | POA: Diagnosis not present

## 2022-11-28 DIAGNOSIS — M25641 Stiffness of right hand, not elsewhere classified: Secondary | ICD-10-CM | POA: Diagnosis not present

## 2022-12-13 DIAGNOSIS — Z23 Encounter for immunization: Secondary | ICD-10-CM | POA: Diagnosis not present

## 2022-12-13 DIAGNOSIS — Z0000 Encounter for general adult medical examination without abnormal findings: Secondary | ICD-10-CM | POA: Diagnosis not present

## 2022-12-25 ENCOUNTER — Encounter (HOSPITAL_BASED_OUTPATIENT_CLINIC_OR_DEPARTMENT_OTHER): Payer: Self-pay

## 2022-12-25 ENCOUNTER — Emergency Department (HOSPITAL_BASED_OUTPATIENT_CLINIC_OR_DEPARTMENT_OTHER): Payer: Medicare Other

## 2022-12-25 ENCOUNTER — Inpatient Hospital Stay (HOSPITAL_BASED_OUTPATIENT_CLINIC_OR_DEPARTMENT_OTHER)
Admission: EM | Admit: 2022-12-25 | Discharge: 2022-12-27 | DRG: 205 | Disposition: A | Discharge status: Home Health | Payer: Medicare Other | Attending: Internal Medicine | Admitting: Internal Medicine

## 2022-12-25 ENCOUNTER — Encounter (HOSPITAL_COMMUNITY): Payer: Self-pay

## 2022-12-25 ENCOUNTER — Other Ambulatory Visit: Payer: Self-pay

## 2022-12-25 DIAGNOSIS — Z79899 Other long term (current) drug therapy: Secondary | ICD-10-CM | POA: Diagnosis not present

## 2022-12-25 DIAGNOSIS — Z66 Do not resuscitate: Secondary | ICD-10-CM | POA: Diagnosis not present

## 2022-12-25 DIAGNOSIS — L89899 Pressure ulcer of other site, unspecified stage: Secondary | ICD-10-CM | POA: Diagnosis present

## 2022-12-25 DIAGNOSIS — Z87891 Personal history of nicotine dependence: Secondary | ICD-10-CM

## 2022-12-25 DIAGNOSIS — Y842 Radiological procedure and radiotherapy as the cause of abnormal reaction of the patient, or of later complication, without mention of misadventure at the time of the procedure: Secondary | ICD-10-CM | POA: Diagnosis present

## 2022-12-25 DIAGNOSIS — Z888 Allergy status to other drugs, medicaments and biological substances status: Secondary | ICD-10-CM

## 2022-12-25 DIAGNOSIS — J189 Pneumonia, unspecified organism: Principal | ICD-10-CM

## 2022-12-25 DIAGNOSIS — Z1152 Encounter for screening for COVID-19: Secondary | ICD-10-CM

## 2022-12-25 DIAGNOSIS — E43 Unspecified severe protein-calorie malnutrition: Secondary | ICD-10-CM | POA: Diagnosis not present

## 2022-12-25 DIAGNOSIS — R5381 Other malaise: Secondary | ICD-10-CM | POA: Diagnosis present

## 2022-12-25 DIAGNOSIS — D638 Anemia in other chronic diseases classified elsewhere: Secondary | ICD-10-CM | POA: Diagnosis present

## 2022-12-25 DIAGNOSIS — F32A Depression, unspecified: Secondary | ICD-10-CM | POA: Diagnosis not present

## 2022-12-25 DIAGNOSIS — R5383 Other fatigue: Secondary | ICD-10-CM | POA: Diagnosis not present

## 2022-12-25 DIAGNOSIS — Z923 Personal history of irradiation: Secondary | ICD-10-CM

## 2022-12-25 DIAGNOSIS — R197 Diarrhea, unspecified: Secondary | ICD-10-CM | POA: Diagnosis not present

## 2022-12-25 DIAGNOSIS — F3289 Other specified depressive episodes: Secondary | ICD-10-CM | POA: Diagnosis not present

## 2022-12-25 DIAGNOSIS — J90 Pleural effusion, not elsewhere classified: Secondary | ICD-10-CM | POA: Diagnosis present

## 2022-12-25 DIAGNOSIS — C3491 Malignant neoplasm of unspecified part of right bronchus or lung: Secondary | ICD-10-CM | POA: Diagnosis not present

## 2022-12-25 DIAGNOSIS — J449 Chronic obstructive pulmonary disease, unspecified: Secondary | ICD-10-CM | POA: Diagnosis not present

## 2022-12-25 DIAGNOSIS — J441 Chronic obstructive pulmonary disease with (acute) exacerbation: Secondary | ICD-10-CM | POA: Diagnosis not present

## 2022-12-25 DIAGNOSIS — Z880 Allergy status to penicillin: Secondary | ICD-10-CM

## 2022-12-25 DIAGNOSIS — Z6820 Body mass index (BMI) 20.0-20.9, adult: Secondary | ICD-10-CM

## 2022-12-25 DIAGNOSIS — J701 Chronic and other pulmonary manifestations due to radiation: Secondary | ICD-10-CM | POA: Diagnosis not present

## 2022-12-25 DIAGNOSIS — R059 Cough, unspecified: Secondary | ICD-10-CM | POA: Diagnosis not present

## 2022-12-25 DIAGNOSIS — I10 Essential (primary) hypertension: Secondary | ICD-10-CM | POA: Diagnosis present

## 2022-12-25 DIAGNOSIS — R531 Weakness: Secondary | ICD-10-CM | POA: Diagnosis not present

## 2022-12-25 DIAGNOSIS — K219 Gastro-esophageal reflux disease without esophagitis: Secondary | ICD-10-CM | POA: Diagnosis not present

## 2022-12-25 DIAGNOSIS — E860 Dehydration: Secondary | ICD-10-CM | POA: Diagnosis not present

## 2022-12-25 DIAGNOSIS — E876 Hypokalemia: Secondary | ICD-10-CM | POA: Diagnosis present

## 2022-12-25 DIAGNOSIS — J700 Acute pulmonary manifestations due to radiation: Secondary | ICD-10-CM | POA: Diagnosis not present

## 2022-12-25 DIAGNOSIS — J069 Acute upper respiratory infection, unspecified: Secondary | ICD-10-CM | POA: Diagnosis present

## 2022-12-25 HISTORY — DX: Pneumonia, unspecified organism: J18.9

## 2022-12-25 LAB — CBC WITH DIFFERENTIAL/PLATELET
Abs Immature Granulocytes: 0.02 10*3/uL (ref 0.00–0.07)
Basophils Absolute: 0 10*3/uL (ref 0.0–0.1)
Basophils Relative: 0 %
Eosinophils Absolute: 0.1 10*3/uL (ref 0.0–0.5)
Eosinophils Relative: 1 %
HCT: 33.8 % — ABNORMAL LOW (ref 36.0–46.0)
Hemoglobin: 11.5 g/dL — ABNORMAL LOW (ref 12.0–15.0)
Immature Granulocytes: 0 %
Lymphocytes Relative: 6 %
Lymphs Abs: 0.5 10*3/uL — ABNORMAL LOW (ref 0.7–4.0)
MCH: 32.1 pg (ref 26.0–34.0)
MCHC: 34 g/dL (ref 30.0–36.0)
MCV: 94.4 fL (ref 80.0–100.0)
Monocytes Absolute: 0.9 10*3/uL (ref 0.1–1.0)
Monocytes Relative: 11 %
Neutro Abs: 6.7 10*3/uL (ref 1.7–7.7)
Neutrophils Relative %: 82 %
Platelets: 244 10*3/uL (ref 150–400)
RBC: 3.58 MIL/uL — ABNORMAL LOW (ref 3.87–5.11)
RDW: 13.4 % (ref 11.5–15.5)
WBC: 8.2 10*3/uL (ref 4.0–10.5)
nRBC: 0 % (ref 0.0–0.2)

## 2022-12-25 LAB — COMPREHENSIVE METABOLIC PANEL
ALT: 36 U/L (ref 0–44)
AST: 55 U/L — ABNORMAL HIGH (ref 15–41)
Albumin: 2.9 g/dL — ABNORMAL LOW (ref 3.5–5.0)
Alkaline Phosphatase: 44 U/L (ref 38–126)
Anion gap: 9 (ref 5–15)
BUN: 22 mg/dL (ref 8–23)
CO2: 25 mmol/L (ref 22–32)
Calcium: 7.5 mg/dL — ABNORMAL LOW (ref 8.9–10.3)
Chloride: 102 mmol/L (ref 98–111)
Creatinine, Ser: 0.87 mg/dL (ref 0.44–1.00)
GFR, Estimated: >60 mL/min (ref >60)
Glucose, Bld: 96 mg/dL (ref 70–99)
Potassium: 3.2 mmol/L — ABNORMAL LOW (ref 3.5–5.1)
Sodium: 136 mmol/L (ref 135–145)
Total Bilirubin: 1.5 mg/dL — ABNORMAL HIGH (ref 0.3–1.2)
Total Protein: 5.7 g/dL — ABNORMAL LOW (ref 6.5–8.1)

## 2022-12-25 LAB — PHOSPHORUS: Phosphorus: 2.3 mg/dL — ABNORMAL LOW (ref 2.5–4.6)

## 2022-12-25 LAB — RESP PANEL BY RT-PCR (RSV, FLU A&B, COVID)  RVPGX2
Influenza A by PCR: NEGATIVE
Influenza B by PCR: NEGATIVE
Resp Syncytial Virus by PCR: NEGATIVE
SARS Coronavirus 2 by RT PCR: NEGATIVE

## 2022-12-25 LAB — MAGNESIUM: Magnesium: 1.3 mg/dL — ABNORMAL LOW (ref 1.7–2.4)

## 2022-12-25 MED ORDER — SODIUM CHLORIDE 0.9 % IV SOLN
500.0000 mg | Freq: Once | INTRAVENOUS | Status: AC
  Administered 2022-12-25: 500 mg via INTRAVENOUS
  Filled 2022-12-25: qty 5

## 2022-12-25 MED ORDER — SODIUM CHLORIDE 0.9 % IV BOLUS
500.0000 mL | Freq: Once | INTRAVENOUS | Status: AC
  Administered 2022-12-25: 500 mL via INTRAVENOUS

## 2022-12-25 MED ORDER — LACTATED RINGERS IV SOLN: INTRAVENOUS | Status: DC

## 2022-12-25 MED ORDER — IOHEXOL 350 MG/ML SOLN
100.0000 mL | Freq: Once | INTRAVENOUS | Status: AC | PRN
  Administered 2022-12-25: 75 mL via INTRAVENOUS

## 2022-12-25 MED ORDER — SODIUM CHLORIDE 0.9 % IV SOLN
1.0000 g | Freq: Once | INTRAVENOUS | Status: AC
  Administered 2022-12-25: 1 g via INTRAVENOUS
  Filled 2022-12-25: qty 10

## 2022-12-25 MED ORDER — POTASSIUM CHLORIDE CRYS ER 20 MEQ PO TBCR
40.0000 meq | EXTENDED_RELEASE_TABLET | Freq: Once | ORAL | Status: AC
  Administered 2022-12-25: 40 meq via ORAL
  Filled 2022-12-25: qty 2

## 2022-12-25 NOTE — ED Notes (Signed)
Patient assisted back into stretcher. Bedside commode emptied. Patient repositioned for comfort. Denies any other needs at present. Bed locked in lowest position with side rails raised x2. Call bell within reach.

## 2022-12-25 NOTE — ED Notes (Signed)
Patient assisted to bedside commode.

## 2022-12-25 NOTE — ED Triage Notes (Signed)
C/o congestion, fatigue, cough, diarrhea, weakness. Taking immodium with improvement.

## 2022-12-25 NOTE — ED Notes (Signed)
Patient c/o pain to IV site, unable to flush IV. IV removed and replaced. Patient assisted back into bed and repositioned for comfort. Denies any other needs at present. Bed locked in lowest position with side rails raised x2. Call bell within reach.

## 2022-12-25 NOTE — ED Notes (Signed)
Pt repositioned in bed and given ginger ale and water requesting food and states she is ready to go home

## 2022-12-25 NOTE — ED Notes (Signed)
Patient called out stating she wanted to leave as they "didn't have any room for her in the hospital". Pt informed that it often takes some time to get a bed, and that she should hopefully be getting one soon. Informed her risks of leaving and that she would have to sign out AMA if she wanted to go home. Pt states she has not had anything to eat all day. Offered food and drink per request. Pt complaining of her back hurting, noticed to be sliding down in bed.  Pt provided with lean cuisine, chicken noodle soup and a drink, pulled up in bed and adjusted legs. Lighting dimmed for comfort and TV turned on. Pt appreciative, denies further needs at this time.

## 2022-12-25 NOTE — ED Provider Notes (Signed)
Emergency Department Provider Note   I have reviewed the triage vital signs and the nursing notes.   HISTORY  Chief Complaint Cough and Fatigue   HPI [name] is a 88 y.o. female with past history reviewed below including COPD presents to the emergency department with upper respiratory infection symptoms, weakness, diarrhea.  Symptoms began 2 weeks prior and have progressively worsened.  She had diarrhea which has been treating with Imodium and that has resolved her diarrhea symptoms.  She is not having any abdominal pain.  She has nausea and poor appetite overall.  She is been drinking fluids but family states that this has decreased as well.  She has a lot of nasal congestion, chest congestion, cough, headaches, and body aches.  Tested negative for COVID just prior to the start of illness.   Past Medical History:  Diagnosis Date   Arthritis    HANDS,  WRISTS   COPD (chronic obstructive pulmonary disease) ([place])    Depression 04/04/2017   Dyspnea on exertion    Encounter for therapeutic drug monitoring 10/01/2016   GERD (gastroesophageal reflux disease)    at times   Hemorrhoid    History of anal fissures    History of kidney stones    History of lung cancer ONCOLOGIST--  DR [name]--  LAST CT ,  NO RECURRENCE OR METS   DX JAN 2010 --  STAGE IIIA  NON-SMALL CELL ADENOCARCINOMA (RIGHT MIDDLE LOBE)---  S/P  CHEMORADIATIO THERAPY  (COMPLETE 03-21-2009)   HTN (hypertension) 10/10/2017   white coat syndrome   Imbalance 06/04/2017   lung ca dx'd 2010   Normal cardiac stress test    2007  PER PT   Osteoporosis    Rash, skin    RIGHT FOREARM/ HAND   Right wrist pain    MASS   Wears glasses     Review of Systems  Constitutional: No fever. Positive chills and fatigue.  ENT: Positive sore throat and congestion.  Cardiovascular: Denies chest pain. Respiratory: Denies shortness of breath. Positive cough.  Gastrointestinal: No abdominal pain.  No nausea, no vomiting.  Positive diarrhea (resolved).  No constipation. Genitourinary: Negative for dysuria. Musculoskeletal: Negative for back pain. Skin: Negative for rash. Neurological: Positive HA.    ____________________________________________   PHYSICAL EXAM:  VITAL SIGNS: ED Triage Vitals  Enc Vitals Group     BP 12/25/22 1029 101/66     Pulse Rate 12/25/22 1029 97     Resp 12/25/22 1029 18     Temp 12/25/22 1029 98.5 F (36.9 C)     Temp Source 12/25/22 1029 Oral     SpO2 12/25/22 1029 96 %     Weight 12/25/22 1026 101 lb (45.8 kg)     Height 12/25/22 1026 5\' 1"  (1.549 m)   Constitutional: Alert and oriented. Well appearing and in no acute distress. Eyes: Conjunctivae are normal.  Head: Atraumatic. Nose: Positive congestion/rhinnorhea. Mouth/Throat: Mucous membranes are moist.  Oropharynx non-erythematous. Neck: No stridor. Cardiovascular: Normal rate, regular rhythm. Good peripheral circulation. Grossly normal heart sounds.   Respiratory: Normal respiratory effort.  No retractions. Lungs CTAB. Gastrointestinal: Soft and nontender. No distention.  Musculoskeletal: No lower extremity tenderness nor edema. No gross deformities of extremities. Neurologic:  Normal speech and language. No gross focal neurologic deficits are appreciated.  Skin:  Skin is warm, dry and intact. No rash noted.  ____________________________________________   LABS (all labs ordered are listed, but only abnormal results are displayed)  Labs Reviewed  RESP PANEL BY RT-PCR (RSV, FLU A&B, COVID)  [id number]  COMPREHENSIVE METABOLIC PANEL  CBC WITH DIFFERENTIAL/PLATELET   ____________________________________________  EKG  *** ____________________________________________  RADIOLOGY  No results found.  ____________________________________________   PROCEDURES  Procedure(s) performed:   Procedures   ____________________________________________   INITIAL IMPRESSION / ASSESSMENT AND PLAN / ED  COURSE  Pertinent labs & imaging results that were available during my care of the patient were reviewed by me and considered in my medical decision making (see chart for details).   This patient is Presenting for Evaluation of cough/congestion, which does require a range of treatment options, and is a complaint that involves a high risk of morbidity and mortality.  The Differential Diagnoses include COVID, Flu, RSV, CAP, CHF, ACS, PE, etc.  Critical Interventions-    Medications  sodium chloride 0.9 % bolus 500 mL (has no administration in time range)    Reassessment after intervention:     I did obtain Additional Historical Information from family at bedside.   I decided to review pertinent External Data, and in summary patient is on oral chemotherapy for lung CA.   Clinical Laboratory Tests Ordered, included ***  Radiologic Tests Ordered, included ***. I independently interpreted the images and agree with radiology interpretation.   Cardiac Monitor Tracing which shows NSR.    Social Determinants of Health Risk patient is not an active smoker.   Consult complete with  Medical Decision Making: Summary:  Presents emergency department with congestion, fatigue, cough.  She has had diarrhea and weakness as well.  Symptoms developing over the past 2 weeks.  Lung sounds are clear.  Patient appears well overall but given age and oral chemotherapy at home plan for workup including labs, chest x-ray, IV fluids. No AMS on my assessment.   Reevaluation with update and discussion with   ***Considered admission***  Patient's presentation is most consistent with acute presentation with potential threat to life or bodily function.   Disposition:   ____________________________________________  FINAL CLINICAL IMPRESSION(S) / ED DIAGNOSES  Final diagnoses:  None     NEW OUTPATIENT MEDICATIONS STARTED DURING THIS VISIT:  New Prescriptions   No medications on file    Note:   This document was prepared using Dragon voice recognition software and may include unintentional dictation errors.  Nanda Quinton, MD, [facility] Emergency Medicine

## 2022-12-25 NOTE — ED Notes (Signed)
[name] (daughter-in-law) [phone], notified of admission

## 2022-12-25 NOTE — Progress Notes (Signed)
Plan of Care Note for accepted transfer   Patient: Dana [name] MRN: [id number]   [place]: 12/25/2022  Facility requesting transfer: [facility].  Requesting Provider: Nanda Quinton, ND. Reason for transfer: CAP with pleural effusions.  History of COPD/lung cancer Facility course:  Per EDP: "Chief Complaint Cough and Fatigue   HPI Dana [name] is a 88 y.o. female with past history reviewed below including COPD presents to the emergency department with upper respiratory infection symptoms, weakness, diarrhea.  Symptoms began 2 weeks prior and have progressively worsened.  She had diarrhea which has been treating with Imodium and that has resolved her diarrhea symptoms.  She is not having any abdominal pain.  She has nausea and poor appetite overall.  She is been drinking fluids but family states that this has decreased as well.  She has a lot of nasal congestion, chest congestion, cough, headaches, and body aches.  Tested negative for COVID just prior to the start of illness.   Lab work: Advertising account executive [[id number]] (Abnormal)   Collected: 12/25/22 1128   Updated: 12/25/22 1158   Specimen Type: Blood   Specimen Source: Vein    Sodium 136 mmol/L   Potassium 3.2 Low  mmol/L   Chloride 102 mmol/L   CO2 25 mmol/L   Glucose, Bld 96 mg/dL   BUN 22 mg/dL   Creatinine, Ser 0.87 mg/dL   Calcium 7.5 Low  mg/dL   Total Protein 5.7 Low  g/dL   Albumin 2.9 Low  g/dL   AST 55 High  U/L   ALT 36 U/L   Alkaline Phosphatase 44 U/L   Total Bilirubin 1.5 High  mg/dL   GFR, Estimated >60 mL/min   Anion gap 9  Resp panel by RT-PCR (RSV, Flu A&B, Covid) Anterior Nasal Swab [[id number]]   Collected: 12/25/22 1051   Updated: 12/25/22 1147   Specimen Source: Anterior Nasal Swab    SARS Coronavirus 2 by RT PCR NEGATIVE   Influenza A by PCR NEGATIVE   Influenza B by PCR NEGATIVE   Resp Syncytial Virus by PCR NEGATIVE  CBC with Differential [[id number]] (Abnormal)   Collected:  12/25/22 1128   Updated: 12/25/22 1136   Specimen Type: Blood   Specimen Source: Vein    WBC 8.2 K/uL   RBC 3.58 Low  MIL/uL   Hemoglobin 11.5 Low  g/dL   HCT 33.8 Low  %   MCV 94.4 fL   MCH 32.1 pg   MCHC 34.0 g/dL   RDW 13.4 %   Platelets 244 K/uL   nRBC 0.0 %   Neutrophils Relative % 82 %   Neutro Abs 6.7 K/uL   Lymphocytes Relative 6 %   Lymphs Abs 0.5 Low  K/uL   Monocytes Relative 11 %   Monocytes Absolute 0.9 K/uL   Eosinophils Relative 1 %   Eosinophils Absolute 0.1 K/uL   Basophils Relative 0 %   Basophils Absolute 0.0 K/uL   Immature Granulocytes 0 %   Abs Immature Granulocytes 0.02 K/uL   Imaging: PORTABLE CHEST 1 VIEW   COMPARISON:  September 21, 2020   FINDINGS: Heart size is stable. Mediastinal contours and hilar structures are also stable since previous imaging.   Interval development of RIGHT-sided pleural effusion and basilar airspace disease. LEFT chest is clear. No pneumothorax. On limited assessment no acute skeletal process.   IMPRESSION: Interval development of RIGHT-sided pleural effusion and basilar airspace disease. Correlate with any signs of infection. In a patient with  history of non-small cell lung cancer would consider close follow-up to ensure improvement/resolution or with nonemergent chest CT as warranted.     Electronically Signed   By: Zetta Bills M.D.   On: 12/25/2022 11:19  She has been started on CAP antibiotic coverage. CTA chest will be done to further characterize and at the same time rule out PE.  Plan of care: The patient is accepted for admission to Telemetry unit, at [facility]..   Author: Reubin Milan, MD 12/25/2022  Check [email or web address] for on-call coverage.  Nursing staff, Please call [name] number on Amion as soon as patient's arrival, so appropriate admitting provider can evaluate the pt.

## 2022-12-25 NOTE — ED Notes (Signed)
Daughter in law back in room with pt updated on tstll waiting for room

## 2022-12-25 NOTE — ED Notes (Signed)
Family went to get something to eat

## 2022-12-26 ENCOUNTER — Telehealth: Payer: Self-pay | Admitting: Medical Oncology

## 2022-12-26 ENCOUNTER — Encounter (HOSPITAL_COMMUNITY): Payer: Self-pay | Admitting: Family Medicine

## 2022-12-26 DIAGNOSIS — Z888 Allergy status to other drugs, medicaments and biological substances status: Secondary | ICD-10-CM | POA: Diagnosis not present

## 2022-12-26 DIAGNOSIS — Z923 Personal history of irradiation: Secondary | ICD-10-CM | POA: Diagnosis not present

## 2022-12-26 DIAGNOSIS — J441 Chronic obstructive pulmonary disease with (acute) exacerbation: Secondary | ICD-10-CM | POA: Diagnosis present

## 2022-12-26 DIAGNOSIS — C3491 Malignant neoplasm of unspecified part of right bronchus or lung: Secondary | ICD-10-CM | POA: Diagnosis not present

## 2022-12-26 DIAGNOSIS — E876 Hypokalemia: Secondary | ICD-10-CM | POA: Diagnosis present

## 2022-12-26 DIAGNOSIS — R197 Diarrhea, unspecified: Secondary | ICD-10-CM | POA: Diagnosis present

## 2022-12-26 DIAGNOSIS — D638 Anemia in other chronic diseases classified elsewhere: Secondary | ICD-10-CM | POA: Diagnosis present

## 2022-12-26 DIAGNOSIS — J449 Chronic obstructive pulmonary disease, unspecified: Secondary | ICD-10-CM | POA: Diagnosis not present

## 2022-12-26 DIAGNOSIS — F3289 Other specified depressive episodes: Secondary | ICD-10-CM | POA: Diagnosis not present

## 2022-12-26 DIAGNOSIS — Z880 Allergy status to penicillin: Secondary | ICD-10-CM | POA: Diagnosis not present

## 2022-12-26 DIAGNOSIS — F32A Depression, unspecified: Secondary | ICD-10-CM | POA: Diagnosis present

## 2022-12-26 DIAGNOSIS — Z79899 Other long term (current) drug therapy: Secondary | ICD-10-CM | POA: Diagnosis not present

## 2022-12-26 DIAGNOSIS — J069 Acute upper respiratory infection, unspecified: Secondary | ICD-10-CM | POA: Diagnosis not present

## 2022-12-26 DIAGNOSIS — E43 Unspecified severe protein-calorie malnutrition: Secondary | ICD-10-CM | POA: Diagnosis not present

## 2022-12-26 DIAGNOSIS — Z6820 Body mass index (BMI) 20.0-20.9, adult: Secondary | ICD-10-CM | POA: Diagnosis not present

## 2022-12-26 DIAGNOSIS — Y842 Radiological procedure and radiotherapy as the cause of abnormal reaction of the patient, or of later complication, without mention of misadventure at the time of the procedure: Secondary | ICD-10-CM | POA: Diagnosis present

## 2022-12-26 DIAGNOSIS — K219 Gastro-esophageal reflux disease without esophagitis: Secondary | ICD-10-CM | POA: Diagnosis present

## 2022-12-26 DIAGNOSIS — L89899 Pressure ulcer of other site, unspecified stage: Secondary | ICD-10-CM | POA: Diagnosis present

## 2022-12-26 DIAGNOSIS — J700 Acute pulmonary manifestations due to radiation: Secondary | ICD-10-CM | POA: Diagnosis present

## 2022-12-26 DIAGNOSIS — E860 Dehydration: Secondary | ICD-10-CM | POA: Diagnosis present

## 2022-12-26 DIAGNOSIS — Z66 Do not resuscitate: Secondary | ICD-10-CM | POA: Diagnosis present

## 2022-12-26 DIAGNOSIS — Z1152 Encounter for screening for COVID-19: Secondary | ICD-10-CM | POA: Diagnosis not present

## 2022-12-26 DIAGNOSIS — I10 Essential (primary) hypertension: Secondary | ICD-10-CM | POA: Diagnosis present

## 2022-12-26 DIAGNOSIS — Z87891 Personal history of nicotine dependence: Secondary | ICD-10-CM | POA: Diagnosis not present

## 2022-12-26 DIAGNOSIS — R5381 Other malaise: Secondary | ICD-10-CM | POA: Diagnosis present

## 2022-12-26 LAB — RESPIRATORY PANEL BY PCR

## 2022-12-26 LAB — COMPREHENSIVE METABOLIC PANEL
ALT: 45 U/L — ABNORMAL HIGH (ref 0–44)
AST: 61 U/L — ABNORMAL HIGH (ref 15–41)
Albumin: 2.5 g/dL — ABNORMAL LOW (ref 3.5–5.0)
Alkaline Phosphatase: 40 U/L (ref 38–126)
Anion gap: 9 (ref 5–15)
BUN: 15 mg/dL (ref 8–23)
CO2: 24 mmol/L (ref 22–32)
Calcium: 7.1 mg/dL — ABNORMAL LOW (ref 8.9–10.3)
Chloride: 104 mmol/L (ref 98–111)
Creatinine, Ser: 0.79 mg/dL (ref 0.44–1.00)
GFR, Estimated: >60 mL/min (ref >60)
Glucose, Bld: 118 mg/dL — ABNORMAL HIGH (ref 70–99)
Potassium: 3.5 mmol/L (ref 3.5–5.1)
Sodium: 137 mmol/L (ref 135–145)
Total Bilirubin: 1.2 mg/dL (ref 0.3–1.2)
Total Protein: 4.9 g/dL — ABNORMAL LOW (ref 6.5–8.1)

## 2022-12-26 LAB — CBC
HCT: 33.4 % — ABNORMAL LOW (ref 36.0–46.0)
Hemoglobin: 11 g/dL — ABNORMAL LOW (ref 12.0–15.0)
MCH: 32.3 pg (ref 26.0–34.0)
MCHC: 32.9 g/dL (ref 30.0–36.0)
MCV: 97.9 fL (ref 80.0–100.0)
Platelets: 252 10*3/uL (ref 150–400)
RBC: 3.41 MIL/uL — ABNORMAL LOW (ref 3.87–5.11)
RDW: 13.7 % (ref 11.5–15.5)
WBC: 6.9 10*3/uL (ref 4.0–10.5)
nRBC: 0 % (ref 0.0–0.2)

## 2022-12-26 LAB — PROCALCITONIN: Procalcitonin: <0.1 ng/mL

## 2022-12-26 LAB — MAGNESIUM: Magnesium: 2.2 mg/dL (ref 1.7–2.4)

## 2022-12-26 MED ORDER — ALBUTEROL SULFATE HFA 108 (90 BASE) MCG/ACT IN AERS: 2.0000 | INHALATION_SPRAY | RESPIRATORY_TRACT | Status: DC | PRN

## 2022-12-26 MED ORDER — ENSURE MAX PROTEIN PO LIQD
11.0000 [oz_av] | Freq: Every day | ORAL | Status: DC
  Filled 2022-12-26: qty 330

## 2022-12-26 MED ORDER — KETOTIFEN FUMARATE 0.035 % OP SOLN
2.0000 [drp] | Freq: Two times a day (BID) | OPHTHALMIC | Status: DC
  Administered 2022-12-26 – 2022-12-27 (×2): 2 [drp] via OPHTHALMIC
  Filled 2022-12-26: qty 5

## 2022-12-26 MED ORDER — ONDANSETRON HCL 4 MG/2ML IJ SOLN: 4.0000 mg | Freq: Four times a day (QID) | INTRAMUSCULAR | Status: DC | PRN

## 2022-12-26 MED ORDER — AZITHROMYCIN 250 MG PO TABS
500.0000 mg | ORAL_TABLET | Freq: Every day | ORAL | Status: DC
  Administered 2022-12-26 – 2022-12-27 (×2): 500 mg via ORAL
  Filled 2022-12-26 (×2): qty 2

## 2022-12-26 MED ORDER — ENOXAPARIN SODIUM 40 MG/0.4ML IJ SOSY
40.0000 mg | PREFILLED_SYRINGE | INTRAMUSCULAR | Status: DC
  Administered 2022-12-26 – 2022-12-27 (×2): 40 mg via SUBCUTANEOUS
  Filled 2022-12-26 (×2): qty 0.4

## 2022-12-26 MED ORDER — ACETAMINOPHEN 650 MG RE SUPP: 650.0000 mg | Freq: Four times a day (QID) | RECTAL | Status: DC | PRN

## 2022-12-26 MED ORDER — SODIUM CHLORIDE 0.9% FLUSH
3.0000 mL | Freq: Two times a day (BID) | INTRAVENOUS | Status: DC
  Administered 2022-12-26 – 2022-12-27 (×3): 3 mL via INTRAVENOUS

## 2022-12-26 MED ORDER — ACETAMINOPHEN 325 MG PO TABS: 650.0000 mg | ORAL_TABLET | Freq: Four times a day (QID) | ORAL | Status: DC | PRN

## 2022-12-26 MED ORDER — ONDANSETRON HCL 4 MG PO TABS: 4.0000 mg | ORAL_TABLET | Freq: Four times a day (QID) | ORAL | Status: DC | PRN

## 2022-12-26 MED ORDER — PREDNISONE 20 MG PO TABS
40.0000 mg | ORAL_TABLET | Freq: Every day | ORAL | Status: DC
  Administered 2022-12-26 – 2022-12-27 (×2): 40 mg via ORAL
  Filled 2022-12-26 (×2): qty 2

## 2022-12-26 MED ORDER — UMECLIDINIUM BROMIDE 62.5 MCG/ACT IN AEPB
1.0000 | INHALATION_SPRAY | Freq: Every day | RESPIRATORY_TRACT | Status: DC
  Administered 2022-12-26 – 2022-12-27 (×2): 1 via RESPIRATORY_TRACT
  Filled 2022-12-26: qty 7

## 2022-12-26 MED ORDER — ARFORMOTEROL TARTRATE 15 MCG/2ML IN NEBU
15.0000 ug | INHALATION_SOLUTION | Freq: Two times a day (BID) | RESPIRATORY_TRACT | Status: DC
  Administered 2022-12-26 – 2022-12-27 (×3): 15 ug via RESPIRATORY_TRACT
  Filled 2022-12-26 (×4): qty 2

## 2022-12-26 MED ORDER — MAGNESIUM SULFATE 2 GM/50ML IV SOLN
2.0000 g | Freq: Once | INTRAVENOUS | Status: AC
  Administered 2022-12-26: 2 g via INTRAVENOUS
  Filled 2022-12-26: qty 50

## 2022-12-26 MED ORDER — ENSURE MAX PROTEIN PO LIQD
11.0000 [oz_av] | Freq: Two times a day (BID) | ORAL | Status: DC
  Administered 2022-12-27: 11 [oz_av] via ORAL
  Filled 2022-12-26 (×2): qty 330

## 2022-12-26 MED ORDER — LACTATED RINGERS IV SOLN: INTRAVENOUS | Status: AC

## 2022-12-26 NOTE — Progress Notes (Signed)
Assumed care from CN. No changes from iniital AM assessment at this time. Pt resting comfortably. Stable at this time

## 2022-12-26 NOTE — Progress Notes (Signed)
[facility]                                                                               [name], is a 88 y.o. female, DOB - [birth year], ZJQ:[id number] Admit date - 12/25/2022    Outpatient Primary MD for the patient is [name], MD  LOS - 0  days    Brief summary    Dana [name] is a pleasant 88 y.o. female with medical history significant for non-small cell lung cancer, COPD, and C. difficile colitis who presents to the emergency department with cough, congestion, fatigue, diarrhea, and loss of appetite.   CTA chest is negative for PE but notable for progressive subcarinal soft tissue changes that could reflect radiation effect versus recurrent tumor, as well as mild progression in left upper lobe nodules.  She was given a dose of antibiotics yesterday and referred to [facility] for admission.      Assessment & Plan    Assessment and Plan:  Radiation pneumonitis/ COPD Flare.  Patient reports her last radiation was about 3 months ago. Empirically was given a dose of antibiotics yesterday.  Today patient reports her cough is better. Will start her on prednisone 40 mg daily and azithromycin 500 mg daily for 3 more days. Patient is currently on room air, check ambulating oxygen levels tomorrow. CTA is negative for PE and any acute pneumonia at this time.    Diarrhea  no more episodes today.    Non small cell lung cancer:  - follows up with Dr [name].  - resume home meds.    Anemia of chronic disease:  Hemoglobin of 11.   RN Pressure Injury Documentation: Pressure Injury 12/26/22 Hand Posterior;Right skin issue (Active)  12/26/22 0100  Location: Hand  Location Orientation: Posterior;Right  Staging:   Wound Description (Comments): skin issue  Present on Admission: Yes    Malnutrition Type:  Nutrition Problem: Severe Malnutrition Etiology: chronic illness (COPD and non-small cell lung cancer)   Malnutrition  Characteristics:  Signs/Symptoms: severe fat depletion, severe muscle depletion   Nutrition Interventions:  Interventions: Premier Protein, Refer to RD note for recommendations  Estimated body mass index is 20.24 kg/m as calculated from the following:   Height as of this encounter: 5\' 1"  (1.549 m).   Weight as of this encounter: 48.6 kg.  Code Status: DNR DVT Prophylaxis:  enoxaparin (LOVENOX) injection 40 mg Start: 12/26/22 1000   Level of Care: Level of care: Telemetry Family Communication: none at bedside.   Disposition Plan:     Remains inpatient appropriate:  POSSIBLE D/C IN AM.   Procedures:  None.   Consultants:   None.   Antimicrobials:   Anti-infectives (From admission, onward)    Start     Dose/Rate Route Frequency Ordered Stop   12/26/22 1230  azithromycin (ZITHROMAX) tablet 500 mg        500 mg Oral Daily 12/26/22 1140 12/29/22 0959   12/25/22 1215  cefTRIAXone (ROCEPHIN) 1 g in sodium chloride 0.9 % 100 mL IVPB        1 g 200 mL/hr over 30 Minutes Intravenous  Once 12/25/22 1205 12/25/22 1421   12/25/22 1215  azithromycin (ZITHROMAX) 500 mg in sodium chloride 0.9 % 250 mL IVPB        500 mg 250 mL/hr over 60 Minutes Intravenous  Once 12/25/22 1205 12/25/22 1545        Medications  Scheduled Meds:  arformoterol  15 mcg Nebulization BID   And   umeclidinium bromide  1 puff Inhalation Daily   azithromycin  500 mg Oral Daily   enoxaparin (LOVENOX) injection  40 mg Subcutaneous Q24H   ketotifen  2 drop Both Eyes BID   predniSONE  40 mg Oral QAC breakfast   [START ON 12/27/2022] Ensure Max Protein  11 oz Oral BID   sodium chloride flush  3 mL Intravenous Q12H   Continuous Infusions:  lactated ringers 75 mL/hr at 12/26/22 0607   PRN Meds:.acetaminophen **OR** acetaminophen, albuterol, ondansetron **OR** ondansetron (ZOFRAN) IV    Subjective:   Dana [name] was seen and examined today.  Reports itching eyes.   Objective:   Vitals:    12/26/22 0454 12/26/22 0500 12/26/22 0811 12/26/22 1257  BP: 105/63   119/75  Pulse: 87   89  Resp: 17   18  Temp: 98.6 F (37 C)   98.4 F (36.9 C)  TempSrc: Oral   Oral  SpO2: 92%  98% 96%  Weight:  48.6 kg    Height:        Intake/Output Summary (Last 24 hours) at 12/26/2022 1506 Last data filed at 12/26/2022 0700 Gross per 24 hour  Intake 321.06 ml  Output 100 ml  Net 221.06 ml   Filed Weights   12/25/22 1026 12/26/22 0500  Weight: 45.8 kg 48.6 kg     Exam General exam: Appears calm and comfortable  Respiratory system: Clear to auscultation. Respiratory effort normal. Cardiovascular system: S1 & S2 heard, RRR. No JVD, No pedal edema. Gastrointestinal system: Abdomen is nondistended, soft and nontender.  Central nervous system: Alert and oriented. No focal neurological deficits. Extremities: Symmetric 5 x 5 power. Skin: No rashes, Psychiatry:  Mood & affect appropriate.    Data Reviewed:  I have personally reviewed following labs and imaging studies   CBC Lab Results  Component Value Date   WBC 6.9 12/26/2022   RBC 3.41 (L) 12/26/2022   HGB 11.0 (L) 12/26/2022   HCT 33.4 (L) 12/26/2022   MCV 97.9 12/26/2022   MCH 32.3 12/26/2022   PLT 252 12/26/2022   MCHC 32.9 12/26/2022   RDW 13.7 12/26/2022   LYMPHSABS 0.5 (L) 12/25/2022   MONOABS 0.9 12/25/2022   EOSABS 0.1 12/25/2022   BASOSABS 0.0 62/56/3893     Last metabolic panel Lab Results  Component Value Date   NA 137 12/26/2022   K 3.5 12/26/2022   CL 104 12/26/2022   CO2 24 12/26/2022   BUN 15 12/26/2022   CREATININE 0.79 12/26/2022   GLUCOSE 118 (H) 12/26/2022   GFRNONAA >60 12/26/2022   GFRAA >60 09/26/2020   CALCIUM 7.1 (L) 12/26/2022   PHOS 2.3 (L) 12/25/2022   PROT 4.9 (L) 12/26/2022   ALBUMIN 2.5 (L) 12/26/2022   BILITOT 1.2 12/26/2022   ALKPHOS 40 12/26/2022   AST 61 (H) 12/26/2022   ALT 45 (H) 12/26/2022   ANIONGAP 9 12/26/2022    CBG (last 3)  No results for input(s): "GLUCAP"  in the last 72 hours.    Coagulation Profile: No results for input(s): "INR", "PROTIME" in the last 168 hours.   Radiology Studies: CT Angio Chest PE W and/or Wo Contrast  Result Date: 12/25/2022 CLINICAL DATA:  Rule out pulmonary embolus. Complains of congestion, fatigue, cough, diarrhea and weakness. History of non-small cell lung cancer. * Tracking Code: BO * EXAM: CT ANGIOGRAPHY CHEST WITH CONTRAST TECHNIQUE: Multidetector CT imaging of the chest was performed using the standard protocol during bolus administration of intravenous contrast. Multiplanar CT image reconstructions and MIPs were obtained to evaluate the vascular anatomy. RADIATION DOSE REDUCTION: This exam was performed according to the departmental dose-optimization program which includes automated exposure control, adjustment of the mA and/or kV according to patient size and/or use of iterative reconstruction technique. CONTRAST:  74mL OMNIPAQUE IOHEXOL 350 MG/ML SOLN COMPARISON:  07/16/2022 FINDINGS: Cardiovascular: Satisfactory opacification of the pulmonary arteries to the segmental level. No evidence of pulmonary embolism. Normal heart size. No pericardial effusion. Aortic atherosclerosis and coronary artery calcifications. Mediastinum/Nodes: The trachea remains patent and midline. Unremarkable appearance of the esophagus. Thyroid gland appears normal. There is progressive subcarinal soft tissue noted, eccentric to the right, and extending posterior to the right hilum. This measures approximately 3.0 x 3.7, image 45/5. This appears contiguous with masslike architectural distortion and radiation change within the perihilar and paramediastinal right lung. Lungs/Pleura: Changes of external beam radiation within the right lung are again noted including masslike architectural distortion and fibrosis within the perihilar and paramediastinal right lung. Similar appearance of pleural thickening overlying the right lung. Part solid nodule within  the apical segment of the left upper lobe is again noted, image 45/8. The internal solid component measures 0.9 cm. This is compared with 0.7 cm previously. Elongated nodular density within the anterior left upper lobe measures 2.3 x 0.5 cm, image 79/8. This is compared with 2.0 x 0.5 cm previously. Upper Abdomen: No acute abnormality. Musculoskeletal: No chest wall abnormality. No acute or significant osseous findings. Review of the MIP images confirms the above findings. IMPRESSION: 1. No evidence for acute pulmonary embolism. 2. Stable appearance of masslike architectural distortion and fibrosis within the perihilar and paramediastinal right lung compatible with external beam radiation. 3. There is progressive subcarinal soft tissue noted, eccentric to the right, and extending posterior to the right hilum. This appears contiguous with masslike architectural distortion and radiation change within the perihilar and paramediastinal right lung. Differential considerations include progressive changes of external beam radiation versus recurrent tumor. Consider more definitive characterization with PET-CT. 4. Mild increase in size of part solid nodule within the apical segment of the left upper lobe. The other nodule within the left upper lobe which appears elongated in predominantly solid is also mildly increased in size in the interval. 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 5. Coronary artery calcifications. 6.  Aortic Atherosclerosis (ICD10-I70.0). Electronically Signed   By: Kerby Moors M.D.   On: 12/25/2022 13:43   DG Chest Portable 1 View  Result Date: 12/25/2022 CLINICAL DATA:  88 year old female presenting for evaluation of shortness of breath. EXAM: PORTABLE CHEST 1 VIEW COMPARISON:  September 21, 2020 FINDINGS: Heart size is stable. Mediastinal contours and hilar structures are also stable since previous imaging. Interval development of RIGHT-sided  pleural effusion and basilar airspace disease. LEFT chest is clear. No pneumothorax. On limited assessment no acute skeletal process. IMPRESSION: Interval development of RIGHT-sided pleural effusion and basilar airspace disease. Correlate with any signs of infection. In a patient with history of non-small cell lung cancer would consider close follow-up to ensure improvement/resolution or with nonemergent chest CT as warranted. Electronically Signed   By: Zetta Bills M.D.   On:  12/25/2022 11:19       Hosie Poisson M.D. [facility] 12/26/2022, 3:06 PM  Available via Epic secure chat 7am-7pm After 7 pm, please refer to night coverage provider listed on amion.

## 2022-12-26 NOTE — Progress Notes (Signed)
Mobility Specialist - Progress Note   12/26/22 1428  Mobility  Activity Ambulated with assistance in hallway  Level of Assistance Standby assist, set-up cues, supervision of patient - no hands on  Assistive Device Front wheel walker  Distance Ambulated (ft) 200 ft  Range of Motion/Exercises Active  Activity Response Tolerated well  Mobility Referral Yes  $Mobility charge 1 Mobility   Pt was found in bed and agreeable to ambulate. Stated having weak legs during ambulation. At EOS returned to bed with all necessities in reach.  Ferd Hibbs Mobility Specialist

## 2022-12-26 NOTE — Telephone Encounter (Addendum)
Pt at [facility] room 1439 for weakness and dehydration. Gi issues , CT done-no PE. Message to [place].

## 2022-12-26 NOTE — H&P (Signed)
History and Physical    Dana [name] HLK:[id number] DOB: 1935-08-19 DOA: 12/25/2022  PCP: [name], MD   Patient coming from: Home   Chief Complaint: Diarrhea, cough, congestion, fatigue, loss of appetite   HPI: Dana [name] is a pleasant 88 y.o. female with medical history significant for non-small cell lung cancer, COPD, and C. difficile colitis who presents to the emergency department with cough, congestion, fatigue, diarrhea, and loss of appetite.  Patient reports that she developed nonproductive cough, sinus congestion, and loose stools on 12/16/2022.  Symptoms have slowly worsened since then and she has become progressively fatigued and generally weak.  She has several loose stools every day but denies abdominal pain or vomiting.   She lives at home with her husband of 48 years.  [facility] ED Course: Upon arrival to the ED, patient is found to be afebrile and saturating well on room air with stable blood pressure.  Blood work notable for potassium 3.2, magnesium 1.3, and albumin 2.9.  CTA chest is negative for PE but notable for progressive subcarinal soft tissue changes that could reflect radiation effect versus recurrent tumor, as well as mild progression in left upper lobe nodules.  She was treated with 500 mL of normal saline, Rocephin, azithromycin, and oral potassium in the ED.  She was transferred to [facility] for admission.  Review of Systems:  All other systems reviewed and apart from HPI, are negative.  Past Medical History:  Diagnosis Date   Arthritis    HANDS,  WRISTS   COPD (chronic obstructive pulmonary disease) ([place])    Depression 04/04/2017   Dyspnea on exertion    Encounter for therapeutic drug monitoring 10/01/2016   GERD (gastroesophageal reflux disease)    at times   Hemorrhoid    History of anal fissures    History of kidney stones    History of lung cancer ONCOLOGIST--  DR [name]--  LAST CT ,  NO RECURRENCE OR METS   DX JAN 2010 --  STAGE  IIIA  NON-SMALL CELL ADENOCARCINOMA (RIGHT MIDDLE LOBE)---  S/P  CHEMORADIATIO THERAPY  (COMPLETE 03-21-2009)   HTN (hypertension) 10/10/2017   white coat syndrome   Imbalance 06/04/2017   lung ca dx'd 2010   Normal cardiac stress test    2007  PER PT   Osteoporosis    Rash, skin    RIGHT FOREARM/ HAND   Right wrist pain    MASS   Wears glasses     Past Surgical History:  Procedure Laterality Date   ANAL FISSURE REPAIR  07/09/2011   INTERNAL SPHINCTEROTOMY   BENIGN EXCISION LEFT BREAST CENTRAL DUCT  02/1999   BREAST BIOPSY  01/31/2009   benign   BREAST EXCISIONAL BIOPSY Left 2004   benign   CHEST TUBE INSERTION Right 07/14/2014   Procedure: INSERTION PLEURAL DRAINAGE CATHETER RIGHT CHEST;  Surgeon: Grace Isaac, MD;  Location: [place];  Service: Thoracic;  Laterality: Right;   EXCISION RIGHT WRIST MASS  2012   EXTRACORPOREAL SHOCK WAVE LITHOTRIPSY Left 06/01/2019   Procedure: EXTRACORPOREAL SHOCK WAVE LITHOTRIPSY (ESWL);  Surgeon: Cleon Gustin, MD;  Location: [facility];  Service: Urology;  Laterality: Left;   KNEE ARTHROSCOPY Right 1999   MASS EXCISION Right 03/11/2014   Procedure: RIGHT WRIST DEEP MASS EXCISION WITH CULTURE AND BIOSPY;  Surgeon: Linna Hoff, MD;  Location: [place];  Service: Orthopedics;  Laterality: Right;   MICROLARYNGOSCOPY W/VOCAL CORD INJECTION Bilateral 02/08/2021   Procedure: MICROLARYNGOSCOPY  WITH VOCAL CORD INJECTION;  Surgeon: Melida Quitter, MD;  Location: [place];  Service: ENT;  Laterality: Bilateral;   REMOVAL OF PLEURAL DRAINAGE CATHETER Right 10/14/2014   Procedure: REMOVAL OF PLEURAL DRAINAGE CATHETER;  Surgeon: Grace Isaac, MD;  Location: [place];  Service: Thoracic;  Laterality: Right;   TALC PLEURODESIS Right 09/16/2014   Procedure: TALC PLEURADESIS/ slurry;  Surgeon: Grace Isaac, MD;  Location: [place];  Service: Thoracic;  Laterality: Right;   THORACOSCOPY  01-26-2009   w/   lung/  node biopsy's   THUMB  ARTHROSCOPY Left    - removed bone spur   TONSILLECTOMY  AS CHILD   TOTAL ABDOMINAL HYSTERECTOMY W/ BILATERAL SALPINGOOPHORECTOMY  1982   W/  APPENDECTOMY   TRANSTHORACIC ECHOCARDIOGRAM  12-23-2008   NORMAL LV/  EF 65-70%/  MILD MR  &  TR   VAULT SUSPENSION PLUS CYSTOCELE REPAIR WITH GRAFT  06-13-2010    Social History:   reports that she quit smoking about 45 years ago. Her smoking use included cigarettes. She has a 30.00 pack-year smoking history. She has never used smokeless tobacco. She reports that she does not drink alcohol and does not use drugs.  Allergies  Allergen Reactions   Methotrexate Shortness Of Breath   Clindamycin Hcl Diarrhea and Other (See Comments)   Amoxicillin Diarrhea    Hx of C Diff    Protonix [Pantoprazole Sodium] Rash    Family History  Problem Relation Age of Onset   Cancer Father        bladder   Cancer Son        kidney - removed as a teenager     Prior to Admission medications   Medication Sig Start Date End Date Taking? Authorizing Provider  antiseptic oral rinse (BIOTENE) LIQD 15 mLs by Mouth Rinse route as needed for dry mouth.   Yes [provider]  b complex vitamins capsule Take 1 capsule by mouth daily.   Yes [provider]  Calcium-Vitamin D ([employer] CALCIUM-600/VIT D PO) Take 2 tablets by mouth daily.   Yes [provider]  cholecalciferol (VITAMIN D) 1000 UNITS tablet Take 2,000 Units by mouth daily.   Yes [provider]  hydrocortisone 2.5 % cream Apply 1 application. topically 2 (two) times daily. 05/08/22  Yes [provider]  loratadine (CLARITIN) 10 MG tablet Take 10 mg by mouth daily as needed for allergies.   Yes [provider]  Polyethyl Glycol-Propyl Glycol (SYSTANE OP) Place 1 drop into both eyes at bedtime as needed (for dry eyes.).   Yes [provider]  vitamin B-12 (CYANOCOBALAMIN) 1000 MCG tablet Take 1 tablet by mouth daily.   Yes [provider]   augmented betamethasone dipropionate (DIPROLENE-AF) 0.05 % cream Apply topically 2 (two) times daily. 04/24/22   [provider]  bismuth subsalicylate (PEPTO BISMOL) 262 MG/15ML suspension Take 30 mLs by mouth every three (3) days as needed.    [provider]  cephALEXin (KEFLEX) 250 MG capsule Take 1 capsule (250 mg total) by mouth 3 (three) times daily. 08/06/22   Jaynee Eagles, PA-C  erlotinib (TARCEVA) 100 MG tablet Take 1 tablet (100 mg total) by mouth daily. 08/14/22   Curt Bears, MD  lactase (LACTAID) 3000 units tablet Take 3,000 Units by mouth daily.    [provider]  Misc Natural Products (OSTEO BI-FLEX JOINT SHIELD PO) Take 2 tablets by mouth daily.    [provider]  Simethicone (GAS-X PO)  Take 1 tablet by mouth 2 (two) times daily as needed (for gas).     [provider]  Tiotropium Bromide-Olodaterol (STIOLTO RESPIMAT) 2.5-2.5 MCG/ACT AERS INHALE 2 PUFFS BY MOUTH ONCE DAILY 07/10/22   Tanda Rockers, MD    Physical Exam: Vitals:   12/25/22 2030 12/25/22 2228 12/25/22 2330 12/26/22 0057  BP: 121/72  121/73 (!) 145/71  Pulse: (!) 106  97 97  Resp: (!) 23  20 19   Temp:  99.2 F (37.3 C) 99.1 F (37.3 C) 98.4 F (36.9 C)  TempSrc:  Oral Oral Oral  SpO2: 95%  97% 100%  Weight:      Height:        Constitutional: NAD, calm, frail-appearing   Eyes: PERTLA, lids and conjunctivae normal ENMT: Mucous membranes are moist. Posterior pharynx clear of any exudate or lesions.   Neck: supple, no masses  Respiratory: no wheezing, no crackles. No accessory muscle use.  Cardiovascular: S1 & S2 heard, regular rate and rhythm. No extremity edema.   Abdomen: No distension, no tenderness, soft. Bowel sounds active.  Musculoskeletal: no clubbing / cyanosis. No joint deformity upper and lower extremities.   Skin: no significant rashes, lesions, ulcers. Warm, dry, well-perfused. Neurologic: CN 2-12 grossly intact. Moving all extremities. Alert  and oriented.  Psychiatric: Pleasant. Cooperative.    Labs and Imaging on Admission: I have personally reviewed following labs and imaging studies  CBC: Recent Labs  Lab 12/25/22 1128  WBC 8.2  NEUTROABS 6.7  HGB 11.5*  HCT 33.8*  MCV 94.4  PLT 696   Basic Metabolic Panel: Recent Labs  Lab 12/25/22 1128 12/25/22 1313  NA 136  --   K 3.2*  --   CL 102  --   CO2 25  --   GLUCOSE 96  --   BUN 22  --   CREATININE 0.87  --   CALCIUM 7.5*  --   MG  --  1.3*  PHOS  --  2.3*   GFR: Estimated Creatinine Clearance: 34.2 mL/min (by C-G formula based on SCr of 0.87 mg/dL). Liver Function Tests: Recent Labs  Lab 12/25/22 1128  AST 55*  ALT 36  ALKPHOS 44  BILITOT 1.5*  PROT 5.7*  ALBUMIN 2.9*   No results for input(s): "LIPASE", "AMYLASE" in the last 168 hours. No results for input(s): "AMMONIA" in the last 168 hours. Coagulation Profile: No results for input(s): "INR", "PROTIME" in the last 168 hours. Cardiac Enzymes: No results for input(s): "CKTOTAL", "CKMB", "CKMBINDEX", "TROPONINI" in the last 168 hours. BNP (last 3 results) No results for input(s): "PROBNP" in the last 8760 hours. HbA1C: No results for input(s): "HGBA1C" in the last 72 hours. CBG: No results for input(s): "GLUCAP" in the last 168 hours. Lipid Profile: No results for input(s): "CHOL", "HDL", "LDLCALC", "TRIG", "CHOLHDL", "LDLDIRECT" in the last 72 hours. Thyroid Function Tests: No results for input(s): "TSH", "T4TOTAL", "FREET4", "T3FREE", "THYROIDAB" in the last 72 hours. Anemia Panel: No results for input(s): "VITAMINB12", "FOLATE", "FERRITIN", "TIBC", "IRON", "RETICCTPCT" in the last 72 hours. Urine analysis:    Component Value Date/Time   COLORURINE YELLOW 10/10/2022 [place] 10/10/2022 1305   LABSPEC 1.015 10/10/2022 1305   PHURINE 7.0 10/10/2022 1305   GLUCOSEU NEGATIVE 10/10/2022 1305   HGBUR TRACE (A) 10/10/2022 [place] 10/10/2022 [place] 10/10/2022 1305   PROTEINUR NEGATIVE 10/10/2022 1305   UROBILINOGEN 1.0 01/06/2013 1029   NITRITE NEGATIVE 10/10/2022 1305  LEUKOCYTESUR NEGATIVE 10/10/2022 1305   Sepsis Labs: @LABRCNTIP (procalcitonin:4,lacticidven:4) ) Recent Results (from the past 240 hour(s))  Resp panel by RT-PCR (RSV, Flu A&B, Covid) Anterior Nasal Swab     Status: None   Collection Time: 12/25/22 10:51 AM   Specimen: Anterior Nasal Swab  Result Value Ref Range Status   SARS Coronavirus 2 by RT PCR NEGATIVE NEGATIVE Final    Comment: (NOTE) SARS-CoV-2 target nucleic acids are NOT DETECTED.  The SARS-CoV-2 RNA is generally detectable in upper respiratory specimens during the acute phase of infection. The lowest concentration of SARS-CoV-2 viral copies this assay can detect is 138 copies/mL. A negative result does not preclude SARS-Cov-2 infection and should not be used as the sole basis for treatment or other patient management decisions. A negative result may occur with  improper specimen collection/handling, submission of specimen other than nasopharyngeal swab, presence of viral mutation(s) within the areas targeted by this assay, and inadequate number of viral copies(<138 copies/mL). A negative result must be combined with clinical observations, patient history, and epidemiological information. The expected result is Negative.  Fact Sheet for Patients:  EntrepreneurPulse.com.au  Fact Sheet for Healthcare Providers:  IncredibleEmployment.be  This test is no t yet approved or cleared by the [place] FDA and  has been authorized for detection and/or diagnosis of SARS-CoV-2 by FDA under an Emergency Use Authorization (EUA). This EUA will remain  in effect (meaning this test can be used) for the duration of the COVID-19 declaration under Section 564(b)(1) of the Act, 21 U.S.C.section 360bbb-3(b)(1), unless the authorization is terminated  or  revoked sooner.       Influenza A by PCR NEGATIVE NEGATIVE Final   Influenza B by PCR NEGATIVE NEGATIVE Final    Comment: (NOTE) The Xpert Xpress SARS-CoV-2/FLU/RSV plus assay is intended as an aid in the diagnosis of influenza from Nasopharyngeal swab specimens and should not be used as a sole basis for treatment. Nasal washings and aspirates are unacceptable for Xpert Xpress SARS-CoV-2/FLU/RSV testing.  Fact Sheet for Patients: EntrepreneurPulse.com.au  Fact Sheet for Healthcare Providers: IncredibleEmployment.be  This test is not yet approved or cleared by the [place] FDA and has been authorized for detection and/or diagnosis of SARS-CoV-2 by FDA under an Emergency Use Authorization (EUA). This EUA will remain in effect (meaning this test can be used) for the duration of the COVID-19 declaration under Section 564(b)(1) of the Act, 21 U.S.C. section 360bbb-3(b)(1), unless the authorization is terminated or revoked.     Resp Syncytial Virus by PCR NEGATIVE NEGATIVE Final    Comment: (NOTE) Fact Sheet for Patients: EntrepreneurPulse.com.au  Fact Sheet for Healthcare Providers: IncredibleEmployment.be  This test is not yet approved or cleared by the [place] FDA and has been authorized for detection and/or diagnosis of SARS-CoV-2 by FDA under an Emergency Use Authorization (EUA). This EUA will remain in effect (meaning this test can be used) for the duration of the COVID-19 declaration under Section 564(b)(1) of the Act, 21 U.S.C. section 360bbb-3(b)(1), unless the authorization is terminated or revoked.  Performed at [facility], [address] [place], [place] [address]      Radiological Exams on Admission: CT Angio Chest PE W and/or Wo Contrast  Result Date: 12/25/2022 CLINICAL DATA:  Rule out pulmonary embolus. Complains of congestion, fatigue, cough, diarrhea and  weakness. History of non-small cell lung cancer. * Tracking Code: BO * EXAM: CT ANGIOGRAPHY CHEST WITH CONTRAST TECHNIQUE: Multidetector CT imaging of the chest was performed using  the standard protocol during bolus administration of intravenous contrast. Multiplanar CT image reconstructions and MIPs were obtained to evaluate the vascular anatomy. RADIATION DOSE REDUCTION: This exam was performed according to the departmental dose-optimization program which includes automated exposure control, adjustment of the mA and/or kV according to patient size and/or use of iterative reconstruction technique. CONTRAST:  37mL OMNIPAQUE IOHEXOL 350 MG/ML SOLN COMPARISON:  07/16/2022 FINDINGS: Cardiovascular: Satisfactory opacification of the pulmonary arteries to the segmental level. No evidence of pulmonary embolism. Normal heart size. No pericardial effusion. Aortic atherosclerosis and coronary artery calcifications. Mediastinum/Nodes: The trachea remains patent and midline. Unremarkable appearance of the esophagus. Thyroid gland appears normal. There is progressive subcarinal soft tissue noted, eccentric to the right, and extending posterior to the right hilum. This measures approximately 3.0 x 3.7, image 45/5. This appears contiguous with masslike architectural distortion and radiation change within the perihilar and paramediastinal right lung. Lungs/Pleura: Changes of external beam radiation within the right lung are again noted including masslike architectural distortion and fibrosis within the perihilar and paramediastinal right lung. Similar appearance of pleural thickening overlying the right lung. Part solid nodule within the apical segment of the left upper lobe is again noted, image 45/8. The internal solid component measures 0.9 cm. This is compared with 0.7 cm previously. Elongated nodular density within the anterior left upper lobe measures 2.3 x 0.5 cm, image 79/8. This is compared with 2.0 x 0.5 cm previously.  Upper Abdomen: No acute abnormality. Musculoskeletal: No chest wall abnormality. No acute or significant osseous findings. Review of the MIP images confirms the above findings. IMPRESSION: 1. No evidence for acute pulmonary embolism. 2. Stable appearance of masslike architectural distortion and fibrosis within the perihilar and paramediastinal right lung compatible with external beam radiation. 3. There is progressive subcarinal soft tissue noted, eccentric to the right, and extending posterior to the right hilum. This appears contiguous with masslike architectural distortion and radiation change within the perihilar and paramediastinal right lung. Differential considerations include progressive changes of external beam radiation versus recurrent tumor. Consider more definitive characterization with PET-CT. 4. Mild increase in size of part solid nodule within the apical segment of the left upper lobe. The other nodule within the left upper lobe which appears elongated in predominantly solid is also mildly increased in size in the interval. 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 5. Coronary artery calcifications. 6.  Aortic Atherosclerosis (ICD10-I70.0). Electronically Signed   By: Kerby Moors M.D.   On: 12/25/2022 13:43   DG Chest Portable 1 View  Result Date: 12/25/2022 CLINICAL DATA:  88 year old female presenting for evaluation of shortness of breath. EXAM: PORTABLE CHEST 1 VIEW COMPARISON:  September 21, 2020 FINDINGS: Heart size is stable. Mediastinal contours and hilar structures are also stable since previous imaging. Interval development of RIGHT-sided pleural effusion and basilar airspace disease. LEFT chest is clear. No pneumothorax. On limited assessment no acute skeletal process. IMPRESSION: Interval development of RIGHT-sided pleural effusion and basilar airspace disease. Correlate with any signs of infection. In a patient with history of non-small  cell lung cancer would consider close follow-up to ensure improvement/resolution or with nonemergent chest CT as warranted. Electronically Signed   By: Zetta Bills M.D.   On: 12/25/2022 11:19    EKG: Independently reviewed. Sinus rhythm, LAD.   Assessment/Plan   1. Diarrhea  - Multiple episodes daily  - She reports hx of C diff colitis x2  - Check C diff and GI pathogen panel, continue IVF hydration,  monitor/replace electrolytes  2. Acute upper respiratory infection  - COVID, influenza, and RSV pcr negative  - No apparent pneumonia on chest CT  - Check 20-pathogen respiratory virus panel, continue supportive care    3. Hypokalemia; hypomagnesemia  - Secondary to GI-losses  - Replacing   4. Lung cancer  - Stage IIIB non-small cell lung cancer currently treated with Tarceva under the care of Dr. [name]    5. COPD  - Not in exacerbation on admission  - Continue ICS-LAMA-LABA and as-needed albuterol    DVT prophylaxis: Lovenox Code Status: DNR  Level of Care: Level of care: Telemetry Family Communication: none present  Disposition Plan:  Patient is from: home  Anticipated d/c is to: TBD Anticipated d/c date is: 12/27/22  Patient currently: Pending resp virus panel, stool studies  Consults called: none  Admission status: Inpatient     Vianne Bulls, MD [facility]  12/26/2022, 4:17 AM

## 2022-12-26 NOTE — Progress Notes (Signed)
Initial Nutrition Assessment  DOCUMENTATION CODES:   Severe malnutrition in context of chronic illness  INTERVENTION:  - Regular diet. - Ensure Max po BID daily, each supplement provides 150 kcal and 30 grams of protein.  - Encourage intake at all meals. - Monitor weight trends closely.   NUTRITION DIAGNOSIS:   Severe Malnutrition related to chronic illness (COPD and non-small cell lung cancer) as evidenced by severe fat depletion, severe muscle depletion.  GOAL:   Patient will meet greater than or equal to 90% of their needs  MONITOR:   PO intake, Supplement acceptance, Weight trends  REASON FOR ASSESSMENT:   Malnutrition Screening Tool    ASSESSMENT:   88 y.o. female with medical history significant for non-small cell lung cancer, COPD, and C. difficile colitis who presents to the emergency department with cough, congestion, fatigue, diarrhea, and loss of appetite.  Patient reports UBW of 105-107# and reports she has likely lost a few pounds over the past week since becoming sick. Patient weighed at both 101# then 107# this admission. Weight without significant changes over the past year prior to this. Patient reports she eats well at home, usually three meals a day with good appetite. Also consumes 1 Premier Protein daily. Shares she doesn't snack and doesn't eat desserts, chocolate, or dairy. Over the past few days she has taken very little in due to diarrhea.  Thankfully, her diarrhea is improving and her appetite is back to normal. Reports eating well at breakfast when she had eggs, bacon, and fruit. Hungry for lunch, so this RD ordered patient her desired meal of a sweet potato, broccoli, and white rice. She is agreeable to try Ensure Max to support intake.  Medications reviewed and include: -  Labs reviewed:  -   NUTRITION - FOCUSED PHYSICAL EXAM:  Flowsheet Row Most Recent Value  Orbital Region Moderate depletion  Upper Arm Region Severe depletion  Thoracic  and Lumbar Region Severe depletion  Buccal Region Severe depletion  Temple Region Severe depletion  Clavicle Bone Region Severe depletion  Clavicle and Acromion Bone Region Severe depletion  Scapular Bone Region Unable to assess  Dorsal Hand Severe depletion  Patellar Region Severe depletion  Anterior Thigh Region Severe depletion  Posterior Calf Region Severe depletion  Edema (RD Assessment) None  Hair Reviewed  Eyes Reviewed  Mouth Reviewed  Skin Reviewed  Nails Reviewed       Diet Order:   Diet Order             Diet regular Room service appropriate? Yes; Fluid consistency: Thin  Diet effective now                   EDUCATION NEEDS:  Education needs have been addressed  Skin:  Skin Assessment: Reviewed RN Assessment  Last BM:  1/2  Height:  Ht Readings from Last 1 Encounters:  12/25/22 5\' 1"  (1.549 m)   Weight:  Wt Readings from Last 1 Encounters:  12/26/22 48.6 kg   Ideal Body Weight:  47.73 kg  BMI:  Body mass index is 20.24 kg/m.  Estimated Nutritional Needs:  Kcal:  1550-1700 kcals Protein:  85-95 grams Fluid:  >/= 1.5L    Samson Frederic RD, LDN For contact information, refer to [facility].

## 2022-12-26 NOTE — Evaluation (Signed)
Physical Therapy Evaluation-1x Patient Details Name: Dana [name] MRN: [id number] DOB: 03-May-1935 Today's Date: 12/26/2022  History of Present Illness  88 yo female admitted with respiratory infection. Hx of NSCLC, COPD, Cdiff  Clinical Impression  On eval, pt was Mod Ind with mobility. She walked ~500 feet with a walker. She tolerated activity well. No LOB with RW. No acute PT needs. 1x eval. Will sign off.      Recommendations for follow up therapy are one component of a multi-disciplinary discharge planning process, led by the attending physician.  Recommendations may be updated based on patient status, additional functional criteria and insurance authorization.  Follow Up Recommendations No PT follow up      Assistance Recommended at Discharge PRN  Patient can return home with the following       Equipment Recommendations None recommended by PT  Recommendations for Other Services       Functional Status Assessment Patient has had a recent decline in their functional status and demonstrates the ability to make significant improvements in function in a reasonable and predictable amount of time.     Precautions / Restrictions Restrictions Weight Bearing Restrictions: No      Mobility  Bed Mobility Overal bed mobility: Modified Independent                  Transfers Overall transfer level: Modified independent                      Ambulation/Gait Ambulation/Gait assistance: Modified independent (Device/Increase time) Gait Distance (Feet): 500 Feet Assistive device: Rolling walker (2 wheels) Gait Pattern/deviations: Step-through pattern       General Gait Details: Mod Ind with RW. No LOB. O2 92% on RA, HR 105 bpm  Stairs            Wheelchair Mobility    Modified Rankin (Stroke Patients Only)       Balance Overall balance assessment: Mild deficits observed, not formally tested                                            Pertinent Vitals/Pain Pain Assessment Pain Assessment: No/denies pain    Home Living Family/patient expects to be discharged to:: Private residence Living Arrangements: Spouse/significant other Available Help at Discharge: Family Type of Home: House Home Access: Stairs to enter   Technical brewer of Steps: 1   Home Layout: One level Home Equipment: Rollator (4 wheels);Cane - single point      Prior Function Prior Level of Function : Independent/Modified Independent             Mobility Comments: uses rollator for ambulation       Hand Dominance        Extremity/Trunk Assessment   Upper Extremity Assessment Upper Extremity Assessment: Overall WFL for tasks assessed    Lower Extremity Assessment Lower Extremity Assessment: Generalized weakness    Cervical / Trunk Assessment Cervical / Trunk Assessment: Normal  Communication   Communication: No difficulties  Cognition Arousal/Alertness: Awake/alert Behavior During Therapy: WFL for tasks assessed/performed Overall Cognitive Status: Within Functional Limits for tasks assessed                                          General Comments  Exercises     Assessment/Plan    PT Assessment Patient does not need any further PT services  PT Problem List         PT Treatment Interventions      PT Goals (Current goals can be found in the Care Plan section)  Acute Rehab PT Goals PT Goal Formulation: All assessment and education complete, DC therapy    Frequency       Co-evaluation               AM-PAC PT "6 Clicks" Mobility  Outcome Measure Help needed turning from your back to your side while in a flat bed without using bedrails?: None Help needed moving from lying on your back to sitting on the side of a flat bed without using bedrails?: None Help needed moving to and from a bed to a chair (including a wheelchair)?: None Help needed standing up from a chair using  your arms (e.g., wheelchair or bedside chair)?: None Help needed to walk in hospital room?: None Help needed climbing 3-5 steps with a railing? : A Little 6 Click Score: 23    End of Session   Activity Tolerance: Patient tolerated treatment well Patient left: in bed;with call bell/phone within reach Nurse Communication:  (made NT aware pt was requesting assistance with ADLs)      Time: 4315-4008 PT Time Calculation (min) (ACUTE ONLY): 17 min   Charges:   PT Evaluation $PT Eval Low Complexity: 1 Low             Doreatha Massed, PT Acute Rehabilitation  Office: [phone]

## 2022-12-27 DIAGNOSIS — J449 Chronic obstructive pulmonary disease, unspecified: Secondary | ICD-10-CM | POA: Diagnosis not present

## 2022-12-27 DIAGNOSIS — J701 Chronic and other pulmonary manifestations due to radiation: Secondary | ICD-10-CM

## 2022-12-27 DIAGNOSIS — J069 Acute upper respiratory infection, unspecified: Secondary | ICD-10-CM | POA: Diagnosis not present

## 2022-12-27 DIAGNOSIS — F3289 Other specified depressive episodes: Secondary | ICD-10-CM | POA: Diagnosis not present

## 2022-12-27 DIAGNOSIS — C3491 Malignant neoplasm of unspecified part of right bronchus or lung: Secondary | ICD-10-CM | POA: Diagnosis not present

## 2022-12-27 LAB — CBC
HCT: 30.1 % — ABNORMAL LOW (ref 36.0–46.0)
Hemoglobin: 10 g/dL — ABNORMAL LOW (ref 12.0–15.0)
MCH: 32.2 pg (ref 26.0–34.0)
MCHC: 33.2 g/dL (ref 30.0–36.0)
MCV: 96.8 fL (ref 80.0–100.0)
Platelets: 240 10*3/uL (ref 150–400)
RBC: 3.11 MIL/uL — ABNORMAL LOW (ref 3.87–5.11)
RDW: 13.8 % (ref 11.5–15.5)
WBC: 5.2 10*3/uL (ref 4.0–10.5)
nRBC: 0 % (ref 0.0–0.2)

## 2022-12-27 LAB — COMPREHENSIVE METABOLIC PANEL
ALT: 50 U/L — ABNORMAL HIGH (ref 0–44)
AST: 54 U/L — ABNORMAL HIGH (ref 15–41)
Albumin: 2.4 g/dL — ABNORMAL LOW (ref 3.5–5.0)
Alkaline Phosphatase: 42 U/L (ref 38–126)
Anion gap: 5 (ref 5–15)
BUN: 25 mg/dL — ABNORMAL HIGH (ref 8–23)
CO2: 25 mmol/L (ref 22–32)
Calcium: 7.4 mg/dL — ABNORMAL LOW (ref 8.9–10.3)
Chloride: 109 mmol/L (ref 98–111)
Creatinine, Ser: 0.84 mg/dL (ref 0.44–1.00)
GFR, Estimated: >60 mL/min (ref >60)
Glucose, Bld: 132 mg/dL — ABNORMAL HIGH (ref 70–99)
Potassium: 3.9 mmol/L (ref 3.5–5.1)
Sodium: 139 mmol/L (ref 135–145)
Total Bilirubin: 0.5 mg/dL (ref 0.3–1.2)
Total Protein: 4.9 g/dL — ABNORMAL LOW (ref 6.5–8.1)

## 2022-12-27 LAB — MAGNESIUM: Magnesium: 1.9 mg/dL (ref 1.7–2.4)

## 2022-12-27 MED ORDER — POTASSIUM & SODIUM PHOSPHATES 280-160-250 MG PO PACK
1.0000 | PACK | Freq: Three times a day (TID) | ORAL | Status: DC
  Administered 2022-12-27: 1 via ORAL
  Filled 2022-12-27 (×2): qty 1

## 2022-12-27 MED ORDER — PREDNISONE 20 MG PO TABS: 40.0000 mg | ORAL_TABLET | Freq: Every day | ORAL | 0 refills | Status: AC

## 2022-12-27 NOTE — TOC Transition Note (Signed)
Transition of Care [facility]) - CM/SW Discharge Note   Patient Details  Name: Dana [name] MRN: [id number] Date of Birth: 16-Jan-1935  Transition of Care [facility]) CM/SW Contact:  Illene Regulus, LCSW Phone Number: 12/27/2022, 12:01 PM   Clinical Narrative:    TOC CSW spoke with pt's daughter [name] in regards to [facility] recommendation. Pt's daughter chose [facility] , pt worked with [facility] agency in the past. [facility] was able to accepted for HHPT . Pt's daughter was informed [facility] agency will be out to the home Monday or Tuesday next week. Pt's daughter was  agreeable. Pt daughter reported she will be providing transportation for her mother at d/c. TOC sign off.    Final next level of care: Home w Home Health Services Barriers to Discharge: No Barriers Identified   Patient Goals and CMS Choice CMS Medicare.gov Compare Post Acute Care list provided to:: Patient Represenative (must comment) Choice offered to / list presented to : Adult Children  Discharge Placement                         Discharge Plan and Services Additional resources added to the After Visit Summary for                                       Social Determinants of Health (SDOH) Interventions SDOH Screenings   Food Insecurity: No Food Insecurity (12/26/2022)  Housing: Low Risk  (12/26/2022)  Transportation Needs: No Transportation Needs (12/26/2022)  Utilities: Not At Risk (12/26/2022)  Tobacco Use: Medium Risk (12/26/2022)     Readmission Risk Interventions     No data to display

## 2022-12-27 NOTE — Discharge Summary (Signed)
Physician Discharge Summary  Dana [name] WUJ:[id number] DOB: 21-Sep-1935 DOA: 12/25/2022  PCP: [name], MD  Admit date: 12/25/2022 Discharge date: 12/27/2022  Admitted From: Home  Discharge disposition: Home    Recommendations for Outpatient Follow-Up:   Follow up with your primary care provider in one week.  Check CBC, BMP, magnesium in the next visit Follow-up with Dr. [name] oncology as outpatient as scheduled by the clinic.  Discharge Diagnosis:   Principal Problem:   Acute upper respiratory infection Active Problems:   Cancer of right lung parenchyma (HCC)   Depression   COPD GOLD 0/ lama/laba responsive   Diarrhea   Hypokalemia   Hypomagnesemia   Protein-calorie malnutrition, severe Radiation fibrosis.  Discharge Condition: Improved.  Diet recommendation:   Regular.  Wound care: None.  Code status: Full.   History of Present Illness:   Dana [name] is a 88 y.o. female with past medical history significant for non-small cell lung cancer, COPD, and history of C. difficile colitis presented to hospital with cough, congestion, fatigue, diarrhea and loss of appetite.  In the ED, patient was mildly hypokalemic, hypomagnesemia with albumin of 2.9.  CTA of the chest was done which showed negative for PE but possible radiation versus recurrent tumor with progression in the left upper lobe.  Patient was given antibiotics with Rocephin and Zithromax and was subsequently admitted hospital for further evaluation and treatment.   Hospital Course:   Following conditions were addressed during hospitalization as listed below,  Radiation pneumonitis/ COPD Flare.  Status post radiation treatment 3 months back.    Was started on prednisone 40mg  and Zithromax 500 mg daily.  CTA negative for PE and acute pneumonia.  Respiratory viral panel negative.  Patient ambulated well without any need for oxygen.  Procalcitonin less than 0.1.Discontinue Zithromax on discharge due to  less likely bacterial component and history of C. difficile.  Continue prednisone on discharge for next 3 days.  Follow-up with PCP and Dr. [name]  oncology as outpatient.   Diarrhea Improved.  Hypokalemia/hypomagnesemia. Improved after replacement.  Latest potassium 3.9 and magnesium 1.9.  Hypophosphatemia. Replacement with kphos   Non small cell lung cancer:  - follows up with Dr [name].  Has been doing well for several years.   Anemia of chronic disease:  Hemoglobin of 11.   RN Pressure Injury Documentation: Pressure Injury 12/26/22 Hand Posterior;Right skin issue (Active)  12/26/22 0100  Location: Hand  Location Orientation: Posterior;Right  Staging:   Wound Description (Comments): skin issue  Present on Admission: Yes     Severe protein calorie malnutrition.  Present on admission, as evidenced by severe fat depletion, muscle depletion..Body mass index is 20.24 kg/m.  Continue nutritional supplement.    Debility, weakness.  Patient has been seen by physical therapy who recommended no skilled therapy needs.   Disposition.  At this time, patient is stable for disposition home with outpatient PCP and oncology follow-up.  Spoke with the patient's husband at bedside.  Medical Consultants:   None.  Procedures:    None Subjective:   Today, patient was seen and examined at bedside.  Denies any nausea, vomiting, fever, chills or rigor.  Feels much better than when she came in.  Was able to ambulate to the bathroom.  Patient husband at bedside.  Discharge Exam:   Vitals:   12/27/22 0521 12/27/22 1253  BP: (!) 140/81   Pulse: 83 95  Resp: 17 20  Temp: (!) 97.5 F (36.4 C) 98 F (36.7  C)  SpO2: 97% 96%   Vitals:   12/26/22 1257 12/26/22 2042 12/27/22 0521 12/27/22 1253  BP: 119/75 123/81 (!) 140/81   Pulse: 89 87 83 95  Resp: 18 20 17 20   Temp: 98.4 F (36.9 C) 98.2 F (36.8 C) (!) 97.5 F (36.4 C) 98 F (36.7 C)  TempSrc: Oral Oral Oral Oral  SpO2: 96%  100% 97% 96%  Weight:      Height:       Body mass index is 20.24 kg/m.   General: Alert awake, not in obvious distress, thinly built, on room air, HENT: pupils equally reacting to light,  No scleral pallor or icterus noted. Oral mucosa is moist.  Chest:  .  Diminished breath sounds bilaterally. No crackles or wheezes.  CVS: S1 &S2 heard. No murmur.  Regular rate and rhythm. Abdomen: Soft, nontender, nondistended.  Bowel sounds are heard.   Extremities: No cyanosis, clubbing or edema.  Peripheral pulses are palpable. Psych: Alert, awake and oriented, normal mood CNS:  No cranial nerve deficits.  Power equal in all extremities.   Skin: Warm and dry.  No rashes noted.  The results of significant diagnostics from this hospitalization (including imaging, microbiology, ancillary and laboratory) are listed below for reference.     Diagnostic Studies:   CT Angio Chest PE W and/or Wo Contrast  Result Date: 12/25/2022 CLINICAL DATA:  Rule out pulmonary embolus. Complains of congestion, fatigue, cough, diarrhea and weakness. History of non-small cell lung cancer. * Tracking Code: BO * EXAM: CT ANGIOGRAPHY CHEST WITH CONTRAST TECHNIQUE: Multidetector CT imaging of the chest was performed using the standard protocol during bolus administration of intravenous contrast. Multiplanar CT image reconstructions and MIPs were obtained to evaluate the vascular anatomy. RADIATION DOSE REDUCTION: This exam was performed according to the departmental dose-optimization program which includes automated exposure control, adjustment of the mA and/or kV according to patient size and/or use of iterative reconstruction technique. CONTRAST:  17mL OMNIPAQUE IOHEXOL 350 MG/ML SOLN COMPARISON:  07/16/2022 FINDINGS: Cardiovascular: Satisfactory opacification of the pulmonary arteries to the segmental level. No evidence of pulmonary embolism. Normal heart size. No pericardial effusion. Aortic atherosclerosis and coronary artery  calcifications. Mediastinum/Nodes: The trachea remains patent and midline. Unremarkable appearance of the esophagus. Thyroid gland appears normal. There is progressive subcarinal soft tissue noted, eccentric to the right, and extending posterior to the right hilum. This measures approximately 3.0 x 3.7, image 45/5. This appears contiguous with masslike architectural distortion and radiation change within the perihilar and paramediastinal right lung. Lungs/Pleura: Changes of external beam radiation within the right lung are again noted including masslike architectural distortion and fibrosis within the perihilar and paramediastinal right lung. Similar appearance of pleural thickening overlying the right lung. Part solid nodule within the apical segment of the left upper lobe is again noted, image 45/8. The internal solid component measures 0.9 cm. This is compared with 0.7 cm previously. Elongated nodular density within the anterior left upper lobe measures 2.3 x 0.5 cm, image 79/8. This is compared with 2.0 x 0.5 cm previously. Upper Abdomen: No acute abnormality. Musculoskeletal: No chest wall abnormality. No acute or significant osseous findings. Review of the MIP images confirms the above findings. IMPRESSION: 1. No evidence for acute pulmonary embolism. 2. Stable appearance of masslike architectural distortion and fibrosis within the perihilar and paramediastinal right lung compatible with external beam radiation. 3. There is progressive subcarinal soft tissue noted, eccentric to the right, and extending posterior to the right hilum. This appears  contiguous with masslike architectural distortion and radiation change within the perihilar and paramediastinal right lung. Differential considerations include progressive changes of external beam radiation versus recurrent tumor. Consider more definitive characterization with PET-CT. 4. Mild increase in size of part solid nodule within the apical segment of the left  upper lobe. The other nodule within the left upper lobe which appears elongated in predominantly solid is also mildly increased in size in the interval. 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 5. Coronary artery calcifications. 6.  Aortic Atherosclerosis (ICD10-I70.0). Electronically Signed   By: Kerby Moors M.D.   On: 12/25/2022 13:43   DG Chest Portable 1 View  Result Date: 12/25/2022 CLINICAL DATA:  88 year old female presenting for evaluation of shortness of breath. EXAM: PORTABLE CHEST 1 VIEW COMPARISON:  September 21, 2020 FINDINGS: Heart size is stable. Mediastinal contours and hilar structures are also stable since previous imaging. Interval development of RIGHT-sided pleural effusion and basilar airspace disease. LEFT chest is clear. No pneumothorax. On limited assessment no acute skeletal process. IMPRESSION: Interval development of RIGHT-sided pleural effusion and basilar airspace disease. Correlate with any signs of infection. In a patient with history of non-small cell lung cancer would consider close follow-up to ensure improvement/resolution or with nonemergent chest CT as warranted. Electronically Signed   By: Zetta Bills M.D.   On: 12/25/2022 11:19     Labs:   Basic Metabolic Panel: Recent Labs  Lab 12/25/22 1128 12/25/22 1313 12/26/22 0436 12/27/22 0409  NA 136  --  137 139  K 3.2*  --  3.5 3.9  CL 102  --  104 109  CO2 25  --  24 25  GLUCOSE 96  --  118* 132*  BUN 22  --  15 25*  CREATININE 0.87  --  0.79 0.84  CALCIUM 7.5*  --  7.1* 7.4*  MG  --  1.3* 2.2 1.9  PHOS  --  2.3*  --   --    GFR Estimated Creatinine Clearance: 36.9 mL/min (by C-G formula based on SCr of 0.84 mg/dL). Liver Function Tests: Recent Labs  Lab 12/25/22 1128 12/26/22 0436 12/27/22 0409  AST 55* 61* 54*  ALT 36 45* 50*  ALKPHOS 44 40 42  BILITOT 1.5* 1.2 0.5  PROT 5.7* 4.9* 4.9*  ALBUMIN 2.9* 2.5* 2.4*   No results for input(s):  "LIPASE", "AMYLASE" in the last 168 hours. No results for input(s): "AMMONIA" in the last 168 hours. Coagulation profile No results for input(s): "INR", "PROTIME" in the last 168 hours.  CBC: Recent Labs  Lab 12/25/22 1128 12/26/22 0436 12/27/22 0409  WBC 8.2 6.9 5.2  NEUTROABS 6.7  --   --   HGB 11.5* 11.0* 10.0*  HCT 33.8* 33.4* 30.1*  MCV 94.4 97.9 96.8  PLT 244 252 240   Cardiac Enzymes: No results for input(s): "CKTOTAL", "CKMB", "CKMBINDEX", "TROPONINI" in the last 168 hours. BNP: Invalid input(s): "POCBNP" CBG: No results for input(s): "GLUCAP" in the last 168 hours. D-Dimer No results for input(s): "DDIMER" in the last 72 hours. Hgb A1c No results for input(s): "HGBA1C" in the last 72 hours. Lipid Profile No results for input(s): "CHOL", "HDL", "LDLCALC", "TRIG", "CHOLHDL", "LDLDIRECT" in the last 72 hours. Thyroid function studies No results for input(s): "TSH", "T4TOTAL", "T3FREE", "THYROIDAB" in the last 72 hours.  Invalid input(s): "FREET3" Anemia work up No results for input(s): "VITAMINB12", "FOLATE", "FERRITIN", "TIBC", "IRON", "RETICCTPCT" in the last 72 hours. Microbiology Recent Results (from the past  240 hour(s))  Resp panel by RT-PCR (RSV, Flu A&B, Covid) Anterior Nasal Swab     Status: None   Collection Time: 12/25/22 10:51 AM   Specimen: Anterior Nasal Swab  Result Value Ref Range Status   SARS Coronavirus 2 by RT PCR NEGATIVE NEGATIVE Final    Comment: (NOTE) SARS-CoV-2 target nucleic acids are NOT DETECTED.  The SARS-CoV-2 RNA is generally detectable in upper respiratory specimens during the acute phase of infection. The lowest concentration of SARS-CoV-2 viral copies this assay can detect is 138 copies/mL. A negative result does not preclude SARS-Cov-2 infection and should not be used as the sole basis for treatment or other patient management decisions. A negative result may occur with  improper specimen collection/handling, submission of  specimen other than nasopharyngeal swab, presence of viral mutation(s) within the areas targeted by this assay, and inadequate number of viral copies(<138 copies/mL). A negative result must be combined with clinical observations, patient history, and epidemiological information. The expected result is Negative.  Fact Sheet for Patients:  EntrepreneurPulse.com.au  Fact Sheet for Healthcare Providers:  IncredibleEmployment.be  This test is no t yet approved or cleared by the [place] FDA and  has been authorized for detection and/or diagnosis of SARS-CoV-2 by FDA under an Emergency Use Authorization (EUA). This EUA will remain  in effect (meaning this test can be used) for the duration of the COVID-19 declaration under Section 564(b)(1) of the Act, 21 U.S.C.section 360bbb-3(b)(1), unless the authorization is terminated  or revoked sooner.       Influenza A by PCR NEGATIVE NEGATIVE Final   Influenza B by PCR NEGATIVE NEGATIVE Final    Comment: (NOTE) The Xpert Xpress SARS-CoV-2/FLU/RSV plus assay is intended as an aid in the diagnosis of influenza from Nasopharyngeal swab specimens and should not be used as a sole basis for treatment. Nasal washings and aspirates are unacceptable for Xpert Xpress SARS-CoV-2/FLU/RSV testing.  Fact Sheet for Patients: EntrepreneurPulse.com.au  Fact Sheet for Healthcare Providers: IncredibleEmployment.be  This test is not yet approved or cleared by the [place] FDA and has been authorized for detection and/or diagnosis of SARS-CoV-2 by FDA under an Emergency Use Authorization (EUA). This EUA will remain in effect (meaning this test can be used) for the duration of the COVID-19 declaration under Section 564(b)(1) of the Act, 21 U.S.C. section 360bbb-3(b)(1), unless the authorization is terminated or revoked.     Resp Syncytial Virus by PCR NEGATIVE NEGATIVE Final     Comment: (NOTE) Fact Sheet for Patients: EntrepreneurPulse.com.au  Fact Sheet for Healthcare Providers: IncredibleEmployment.be  This test is not yet approved or cleared by the [place] FDA and has been authorized for detection and/or diagnosis of SARS-CoV-2 by FDA under an Emergency Use Authorization (EUA). This EUA will remain in effect (meaning this test can be used) for the duration of the COVID-19 declaration under Section 564(b)(1) of the Act, 21 U.S.C. section 360bbb-3(b)(1), unless the authorization is terminated or revoked.  Performed at [facility], [address] [place], [place] [address]   Respiratory (~20 pathogens) panel by PCR     Status: None   Collection Time: 12/26/22  3:00 AM   Specimen: Nasopharyngeal Swab; Respiratory  Result Value Ref Range Status   Adenovirus NOT DETECTED NOT DETECTED Final   Coronavirus 229E NOT DETECTED NOT DETECTED Final    Comment: (NOTE) The Coronavirus on the Respiratory Panel, DOES NOT test for the novel  Coronavirus (2019 nCoV)    Coronavirus HKU1 NOT  DETECTED NOT DETECTED Final   Coronavirus NL63 NOT DETECTED NOT DETECTED Final   Coronavirus OC43 NOT DETECTED NOT DETECTED Final   Metapneumovirus NOT DETECTED NOT DETECTED Final   Rhinovirus / Enterovirus NOT DETECTED NOT DETECTED Final   Influenza A NOT DETECTED NOT DETECTED Final   Influenza B NOT DETECTED NOT DETECTED Final   Parainfluenza Virus 1 NOT DETECTED NOT DETECTED Final   Parainfluenza Virus 2 NOT DETECTED NOT DETECTED Final   Parainfluenza Virus 3 NOT DETECTED NOT DETECTED Final   Parainfluenza Virus 4 NOT DETECTED NOT DETECTED Final   Respiratory Syncytial Virus NOT DETECTED NOT DETECTED Final   Bordetella pertussis NOT DETECTED NOT DETECTED Final   Bordetella Parapertussis NOT DETECTED NOT DETECTED Final   Chlamydophila pneumoniae NOT DETECTED NOT DETECTED Final   Mycoplasma pneumoniae NOT DETECTED NOT  DETECTED Final    Comment: Performed at [facility], [place] [address], [place], [place] [address]     Discharge Instructions:   Discharge Instructions     Diet - low sodium heart healthy   Complete by: As directed    Discharge instructions   Complete by: As directed    Follow up with your primary care physician in one week. Follow up with your oncology as scheduled. Continue with physical therapy. No overexertion. Take the course of prednisone   Increase activity slowly   Complete by: As directed    No wound care   Complete by: As directed       Allergies as of 12/27/2022       Reactions   Methotrexate Shortness Of Breath   Clindamycin Hcl Diarrhea, Other (See Comments)   Amoxicillin Diarrhea   Hx of C Diff    Protonix [pantoprazole Sodium] Rash        Medication List     TAKE these medications    b complex vitamins capsule Take 1 capsule by mouth daily.   bismuth subsalicylate 935 TS/17BL suspension Commonly known as: PEPTO BISMOL Take 30 mLs by mouth daily as needed for indigestion or diarrhea or loose stools.   cholecalciferol 1000 units tablet Commonly known as: VITAMIN D Take 2,000 Units by mouth daily.   [employer]S CALCIUM-600/VIT D PO Take 1 tablet by mouth 2 (two) times daily.   cyanocobalamin 1000 MCG tablet Commonly known as: VITAMIN B12 Take 1 tablet by mouth daily.   erlotinib 100 MG tablet Commonly known as: Tarceva Take 1 tablet (100 mg total) by mouth daily.   GAS-X PO Take 1 tablet by mouth 2 (two) times daily as needed (for gas).   hydrocortisone 2.5 % cream Apply 1 application. topically 2 (two) times daily.   lactase 3000 units tablet Commonly known as: LACTAID Take 3,000 Units by mouth daily as needed (when eating dairy products).   loratadine 10 MG tablet Commonly known as: CLARITIN Take 10 mg by mouth daily as needed for allergies.   predniSONE 20 MG tablet Commonly known as: DELTASONE Take 2 tablets (40 mg total) by mouth  daily before breakfast for 3 days.   Probiotic Acidophilus Chew Chew 1 tablet by mouth daily.   Stiolto Respimat 2.5-2.5 MCG/ACT Aers Generic drug: Tiotropium Bromide-Olodaterol INHALE 2 PUFFS BY MOUTH ONCE DAILY What changed:  how much to take how to take this when to take this   SYSTANE OP Place 1 drop into both eyes at bedtime as needed (for dry eyes.).   tacrolimus 1 MG capsule Commonly known as: PROGRAF 1 mg 2 (two) times daily.  Dissolve  1 mg capsule in 1/2 liter of water soaking a cotton ball and apply to wound twice daily        Follow-up Information     [facility] [place] Follow up on 12/31/2022.   Specialty: [place] Why: will contact you on Monday 12/31/22 Contact information: [address] [place] [address] [phone]                  Time coordinating discharge: 39 minutes  Signed:  Arzella Rehmann  [facility] 12/27/2022, 2:25 PM

## 2022-12-27 NOTE — Progress Notes (Signed)
Physical Therapy Re-Evaluation Patient Details Name: Dana [name] MRN: [id number] DOB: January 17, 1935 Today's Date: 12/27/2022  History of Present Illness  88 yo female admitted with respiratory infection. Hx of NSCLC, COPD, Cdiff  Clinical Impression   Patient ambulating in hall with CNA,  using Rw. Unable to get sats  to pick up. Returned to room, Hot pack to fingers several minutes.  SPO2 100% on RA at rest, 97% while ambulating in hall, HR 103.   No Oxygen needs.  Patient does  request HHPT to ensure her recovery.  No further acute  PT needs. PT signing off.     Recommendations for follow up therapy are one component of a multi-disciplinary discharge planning process, led by the attending physician.  Recommendations may be updated based on patient status, additional functional criteria and insurance authorization.  Follow Up Recommendations Home health PT      Assistance Recommended at Discharge PRN  Patient can return home with the following  Assist for transportation;Assistance with cooking/housework    Equipment Recommendations None recommended by PT  Recommendations for Other Services       Functional Status Assessment       Precautions / Restrictions Precautions Precautions: None      Mobility  Bed Mobility               General bed mobility comments: in hall w/ CNA    Transfers Overall transfer level: Modified independent                      Ambulation/Gait Ambulation/Gait assistance: Modified independent (Device/Increase time) Gait Distance (Feet): 400 Feet (then 200) Assistive device: Rolling walker (2 wheels) Gait Pattern/deviations: Step-through pattern   Gait velocity interpretation: <1.31 ft/sec, indicative of household ambulator   General Gait Details: Mod Ind with RW. No LOB. O2 97% on RA, HR 103 bpm  Stairs            Wheelchair Mobility    Modified Rankin (Stroke Patients Only)       Balance Overall balance  assessment: Mild deficits observed, not formally tested                                           Pertinent Vitals/Pain Pain Assessment Pain Assessment: No/denies pain    Home Living Family/patient expects to be discharged to:: Private residence Living Arrangements: Spouse/significant other                      Prior Function                       Hand Dominance        Extremity/Trunk Assessment                Communication      Cognition Arousal/Alertness: Awake/alert Behavior During Therapy: WFL for tasks assessed/performed Overall Cognitive Status: Within Functional Limits for tasks assessed                                          General Comments      Exercises     Assessment/Plan    PT Assessment    PT Problem List         PT Treatment  Interventions      PT Goals (Current goals can be found in the Care Plan section)  Acute Rehab PT Goals PT Goal Formulation: All assessment and education complete, DC therapy    Frequency       Co-evaluation               AM-PAC PT "6 Clicks" Mobility  Outcome Measure                  End of Session   Activity Tolerance: Patient tolerated treatment well Patient left: in bed;with call bell/phone within reach Nurse Communication: Mobility status PT Visit Diagnosis: Difficulty in walking, not elsewhere classified (R26.2)    Time: 7564-3329 PT Time Calculation (min) (ACUTE ONLY): 26 min   Charges:   PT Evaluation $PT Re-evaluation: 1 Re-eval PT Treatments $Gait Training: 8-22 mins        [place] Office [phone] Weekend [phone]   Claretha Cooper 12/27/2022, 10:52 AM

## 2022-12-27 NOTE — Progress Notes (Signed)
SATURATION QUALIFICATIONS: (This note is used to comply with regulatory documentation for home oxygen)  Patient Saturations on Room Air at Rest = 100%  Patient Saturations on Room Air while Ambulating = 97%  Patient Saturations on  Liters of oxygen while Ambulating = % NA  Please briefly explain why patient needs home oxygen:Patient  does not require supplemental oxygen for ambulation. [place] Office [phone] Weekend [phone]

## 2022-12-27 NOTE — TOC Initial Note (Signed)
Transition of Care [facility]) - Initial/Assessment Note    Patient Details  Name: Dana [name] MRN: [id number] Date of Birth: January 15, 1935  Transition of Care [facility]) CM/SW Contact:    Illene Regulus, LCSW Phone Number: 12/27/2022, 9:46 AM  Clinical Narrative:                 CSW receive a call from pt's daughter Krystn Dermody in regard to pt's d/c plan. CSW received permission from pt to speak with pt's daughter. Pt's daughter judy expressed her concerns about pt's weakness. CSW informed pt's daughter she would need to contact pt's doctor or bedside nurse to discuss any medical concerns she has.CSW provided [facility]  nurse's station contact number.No additional needs TOC sign off.    Expected Discharge Plan: Home/Self Care Barriers to Discharge: Continued Medical Work up   Patient Goals and CMS Choice Patient states their goals for this hospitalization and ongoing recovery are:: Return home          Expected Discharge Plan and Services                                              Prior Living Arrangements/Services   Lives with:: Self, Spouse   Do you feel safe going back to the place where you live?: Yes      Need for Family Participation in Patient Care: Yes (Comment) Care giver support system in place?: No (comment)   Criminal Activity/Legal Involvement Pertinent to Current Situation/Hospitalization: No - Comment as needed  Activities of Daily Living Home Assistive Devices/Equipment: Other (Comment) (rollator) ADL Screening (condition at time of admission) Patient's cognitive ability adequate to safely complete daily activities?: Yes Is the patient deaf or have difficulty hearing?: No Does the patient have difficulty seeing, even when wearing glasses/contacts?: Yes Does the patient have difficulty concentrating, remembering, or making decisions?: No Patient able to express need for assistance with ADLs?: No Does the patient have difficulty dressing or  bathing?: No Independently performs ADLs?: Yes (appropriate for developmental age) Does the patient have difficulty walking or climbing stairs?: No Weakness of Legs: Both Weakness of Arms/Hands: None  Permission Sought/Granted                  Emotional Assessment Appearance:: Appears stated age Attitude/Demeanor/Rapport: Gracious Affect (typically observed): Accepting Orientation: : Oriented to Self, Oriented to Place, Oriented to  Time, Oriented to Situation Alcohol / Substance Use: Not Applicable Psych Involvement: No (comment)  Admission diagnosis:  Dehydration [E86.0] CAP (community acquired pneumonia) [J18.9] Community acquired pneumonia of right lower lobe of lung [J18.9] Patient Active Problem List   Diagnosis Date Noted   Hypokalemia 12/26/2022   Hypomagnesemia 12/26/2022   Acute upper respiratory infection 12/26/2022   Protein-calorie malnutrition, severe 12/26/2022   CAP (community acquired pneumonia) 12/25/2022   AKI (acute kidney injury) ([place]) 09/22/2020   Diarrhea 09/22/2020   Hypotension 09/22/2020   C. difficile diarrhea 09/22/2020   Choledocholithiasis 09/21/2020   COPD GOLD 0/ lama/laba responsive 07/11/2019   GERD clinical dx only  07/11/2019   HTN (hypertension) 10/10/2017   Imbalance 06/04/2017   Depression 04/04/2017   Left ankle injury, initial encounter 02/28/2017   Encounter for therapeutic drug monitoring 10/01/2016   DOE (dyspnea on exertion) 07/05/2016   Pleural effusion, right 07/13/2014   Right arm cellulitis 02/03/2014   Cancer of right lung parenchyma ([place])  11/12/2011   PCP:  [name], MD Pharmacy:   [facility], [address] [place] [address] Phone: [phone] Fax: [phone]     Social Determinants of Health (SDOH) Social History: SDOH Screenings   Food Insecurity: No Food Insecurity (12/26/2022)  Housing: Low Risk  (12/26/2022)  Transportation Needs: No Transportation  Needs (12/26/2022)  Utilities: Not At Risk (12/26/2022)  Tobacco Use: Medium Risk (12/26/2022)   SDOH Interventions:     Readmission Risk Interventions     No data to display

## 2023-01-01 DIAGNOSIS — I10 Essential (primary) hypertension: Secondary | ICD-10-CM | POA: Diagnosis not present

## 2023-01-01 DIAGNOSIS — Z9181 History of falling: Secondary | ICD-10-CM | POA: Diagnosis not present

## 2023-01-01 DIAGNOSIS — I700 Atherosclerosis of aorta: Secondary | ICD-10-CM | POA: Diagnosis not present

## 2023-01-01 DIAGNOSIS — F32A Depression, unspecified: Secondary | ICD-10-CM | POA: Diagnosis not present

## 2023-01-01 DIAGNOSIS — Z85118 Personal history of other malignant neoplasm of bronchus and lung: Secondary | ICD-10-CM | POA: Diagnosis not present

## 2023-01-01 DIAGNOSIS — J440 Chronic obstructive pulmonary disease with acute lower respiratory infection: Secondary | ICD-10-CM | POA: Diagnosis not present

## 2023-01-01 DIAGNOSIS — D638 Anemia in other chronic diseases classified elsewhere: Secondary | ICD-10-CM | POA: Diagnosis not present

## 2023-01-01 DIAGNOSIS — J069 Acute upper respiratory infection, unspecified: Secondary | ICD-10-CM | POA: Diagnosis not present

## 2023-01-01 DIAGNOSIS — E43 Unspecified severe protein-calorie malnutrition: Secondary | ICD-10-CM | POA: Diagnosis not present

## 2023-01-03 DIAGNOSIS — Z681 Body mass index (BMI) 19 or less, adult: Secondary | ICD-10-CM | POA: Diagnosis not present

## 2023-01-03 DIAGNOSIS — E43 Unspecified severe protein-calorie malnutrition: Secondary | ICD-10-CM | POA: Diagnosis not present

## 2023-01-03 DIAGNOSIS — J441 Chronic obstructive pulmonary disease with (acute) exacerbation: Secondary | ICD-10-CM | POA: Diagnosis not present

## 2023-01-03 DIAGNOSIS — E876 Hypokalemia: Secondary | ICD-10-CM | POA: Diagnosis not present

## 2023-01-03 DIAGNOSIS — R5381 Other malaise: Secondary | ICD-10-CM | POA: Diagnosis not present

## 2023-01-03 DIAGNOSIS — R197 Diarrhea, unspecified: Secondary | ICD-10-CM | POA: Diagnosis not present

## 2023-01-03 DIAGNOSIS — R7989 Other specified abnormal findings of blood chemistry: Secondary | ICD-10-CM | POA: Diagnosis not present

## 2023-01-03 DIAGNOSIS — J069 Acute upper respiratory infection, unspecified: Secondary | ICD-10-CM | POA: Diagnosis not present

## 2023-01-03 DIAGNOSIS — D638 Anemia in other chronic diseases classified elsewhere: Secondary | ICD-10-CM | POA: Diagnosis not present

## 2023-01-14 ENCOUNTER — Inpatient Hospital Stay: Payer: Medicare Other

## 2023-01-14 DIAGNOSIS — D638 Anemia in other chronic diseases classified elsewhere: Secondary | ICD-10-CM | POA: Diagnosis not present

## 2023-01-14 DIAGNOSIS — I10 Essential (primary) hypertension: Secondary | ICD-10-CM | POA: Diagnosis not present

## 2023-01-14 DIAGNOSIS — M810 Age-related osteoporosis without current pathological fracture: Secondary | ICD-10-CM | POA: Diagnosis not present

## 2023-01-14 DIAGNOSIS — E43 Unspecified severe protein-calorie malnutrition: Secondary | ICD-10-CM | POA: Diagnosis not present

## 2023-01-14 DIAGNOSIS — Z85118 Personal history of other malignant neoplasm of bronchus and lung: Secondary | ICD-10-CM | POA: Diagnosis not present

## 2023-01-14 DIAGNOSIS — F32A Depression, unspecified: Secondary | ICD-10-CM | POA: Diagnosis not present

## 2023-01-14 DIAGNOSIS — J069 Acute upper respiratory infection, unspecified: Secondary | ICD-10-CM | POA: Diagnosis not present

## 2023-01-14 DIAGNOSIS — J440 Chronic obstructive pulmonary disease with acute lower respiratory infection: Secondary | ICD-10-CM | POA: Diagnosis not present

## 2023-01-14 DIAGNOSIS — Z9181 History of falling: Secondary | ICD-10-CM | POA: Diagnosis not present

## 2023-01-14 DIAGNOSIS — I700 Atherosclerosis of aorta: Secondary | ICD-10-CM | POA: Diagnosis not present

## 2023-01-16 DIAGNOSIS — I10 Essential (primary) hypertension: Secondary | ICD-10-CM | POA: Diagnosis not present

## 2023-01-16 DIAGNOSIS — D638 Anemia in other chronic diseases classified elsewhere: Secondary | ICD-10-CM | POA: Diagnosis not present

## 2023-01-16 DIAGNOSIS — I700 Atherosclerosis of aorta: Secondary | ICD-10-CM | POA: Diagnosis not present

## 2023-01-16 DIAGNOSIS — J440 Chronic obstructive pulmonary disease with acute lower respiratory infection: Secondary | ICD-10-CM | POA: Diagnosis not present

## 2023-01-16 DIAGNOSIS — E43 Unspecified severe protein-calorie malnutrition: Secondary | ICD-10-CM | POA: Diagnosis not present

## 2023-01-16 DIAGNOSIS — Z9181 History of falling: Secondary | ICD-10-CM | POA: Diagnosis not present

## 2023-01-16 DIAGNOSIS — Z85118 Personal history of other malignant neoplasm of bronchus and lung: Secondary | ICD-10-CM | POA: Diagnosis not present

## 2023-01-16 DIAGNOSIS — J069 Acute upper respiratory infection, unspecified: Secondary | ICD-10-CM | POA: Diagnosis not present

## 2023-01-16 DIAGNOSIS — F32A Depression, unspecified: Secondary | ICD-10-CM | POA: Diagnosis not present

## 2023-01-17 ENCOUNTER — Inpatient Hospital Stay: Payer: Medicare Other | Attending: Internal Medicine | Admitting: Internal Medicine

## 2023-01-17 ENCOUNTER — Inpatient Hospital Stay: Payer: Medicare Other

## 2023-01-17 ENCOUNTER — Other Ambulatory Visit: Payer: Self-pay

## 2023-01-17 VITALS — BP 120/77 | HR 77 | Temp 97.9°F | Resp 16 | Ht 61.0 in | Wt 102.5 lb

## 2023-01-17 DIAGNOSIS — C3490 Malignant neoplasm of unspecified part of unspecified bronchus or lung: Secondary | ICD-10-CM

## 2023-01-17 DIAGNOSIS — I10 Essential (primary) hypertension: Secondary | ICD-10-CM | POA: Insufficient documentation

## 2023-01-17 DIAGNOSIS — C3492 Malignant neoplasm of unspecified part of left bronchus or lung: Secondary | ICD-10-CM | POA: Diagnosis not present

## 2023-01-17 DIAGNOSIS — Z90710 Acquired absence of both cervix and uterus: Secondary | ICD-10-CM | POA: Insufficient documentation

## 2023-01-17 LAB — CBC WITH DIFFERENTIAL (CANCER CENTER ONLY)
Abs Immature Granulocytes: 0.01 10*3/uL (ref 0.00–0.07)
Basophils Absolute: 0 10*3/uL (ref 0.0–0.1)
Basophils Relative: 0 %
Eosinophils Absolute: 0.2 10*3/uL (ref 0.0–0.5)
Eosinophils Relative: 3 %
HCT: 35.3 % — ABNORMAL LOW (ref 36.0–46.0)
Hemoglobin: 12.2 g/dL (ref 12.0–15.0)
Immature Granulocytes: 0 %
Lymphocytes Relative: 12 %
Lymphs Abs: 0.6 10*3/uL — ABNORMAL LOW (ref 0.7–4.0)
MCH: 32.8 pg (ref 26.0–34.0)
MCHC: 34.6 g/dL (ref 30.0–36.0)
MCV: 94.9 fL (ref 80.0–100.0)
Monocytes Absolute: 0.5 10*3/uL (ref 0.1–1.0)
Monocytes Relative: 11 %
Neutro Abs: 3.6 10*3/uL (ref 1.7–7.7)
Neutrophils Relative %: 74 %
Platelet Count: 266 10*3/uL (ref 150–400)
RBC: 3.72 MIL/uL — ABNORMAL LOW (ref 3.87–5.11)
RDW: 14.7 % (ref 11.5–15.5)
WBC Count: 4.9 10*3/uL (ref 4.0–10.5)
nRBC: 0 % (ref 0.0–0.2)

## 2023-01-17 LAB — CMP (CANCER CENTER ONLY)
ALT: 22 U/L (ref 0–44)
AST: 26 U/L (ref 15–41)
Albumin: 3.4 g/dL — ABNORMAL LOW (ref 3.5–5.0)
Alkaline Phosphatase: 46 U/L (ref 38–126)
Anion gap: 4 — ABNORMAL LOW (ref 5–15)
BUN: 28 mg/dL — ABNORMAL HIGH (ref 8–23)
CO2: 32 mmol/L (ref 22–32)
Calcium: 9.2 mg/dL (ref 8.9–10.3)
Chloride: 105 mmol/L (ref 98–111)
Creatinine: 0.91 mg/dL (ref 0.44–1.00)
GFR, Estimated: >60 mL/min (ref >60)
Glucose, Bld: 86 mg/dL (ref 70–99)
Potassium: 3.6 mmol/L (ref 3.5–5.1)
Sodium: 141 mmol/L (ref 135–145)
Total Bilirubin: 0.8 mg/dL (ref 0.3–1.2)
Total Protein: 5.9 g/dL — ABNORMAL LOW (ref 6.5–8.1)

## 2023-01-17 NOTE — Progress Notes (Signed)
[name]   [phone]  OFFICE PROGRESS NOTE  [name], MD [name] [address]  DIAGNOSIS: Stage IIIB (T2a, N3, M0) non-small cell lung cancer, adenocarcinoma with positive EGFR mutation diagnosed in January 2010.  PRIOR THERAPY: 1) Status post concurrent chemoradiation with weekly carboplatin and paclitaxel; last dose given March 21, 2009.  2) Tarceva at 150 mg p.o. daily, status post approximately 48 months of treatment, discontinued secondary to persistent diarrhea.  3) status post right Pleurx catheter placement for right nonmalignant pleural effusion.  CURRENT THERAPY: Tarceva 100 mg by mouth daily started 03/19/2013, status post 118 months of treatment.  INTERVAL HISTORY: Dana [name] 88 y.o. [name] returns to the neck today for follow-up visit.  The patient is feeling much better today.  She was admitted to the hospital in early January 2024 with worsening dyspnea and acute upper respiratory infection.  She also had dehydration and poor p.o. intake at that time.  She was treated with Rocephin and azithromycin.  She is feeling much better today.  She denied having any current chest pain but has mild shortness of breath with exertion with no cough or hemoptysis.  She has no nausea, vomiting but has occasional episodes of diarrhea with no constipation.  She lost few pounds since her last visit.  She had CT angiogram of the chest during her hospitalization and she is here for evaluation and discussion of her scan results and recommendation regarding her condition.   MEDICAL HISTORY: Past Medical History:  Diagnosis Date   Arthritis    HANDS,  WRISTS   COPD (chronic obstructive pulmonary disease) ([place])    Depression 04/04/2017   Dyspnea on exertion    Encounter for therapeutic drug monitoring 10/01/2016   GERD (gastroesophageal reflux disease)    at times   Hemorrhoid    History of anal fissures     History of kidney stones    History of lung cancer ONCOLOGIST--  DR [name]--  LAST CT ,  NO RECURRENCE OR METS   DX JAN 2010 --  STAGE IIIA  NON-SMALL CELL ADENOCARCINOMA (RIGHT MIDDLE LOBE)---  S/P  CHEMORADIATIO THERAPY  (COMPLETE 03-21-2009)   HTN (hypertension) 10/10/2017   white coat syndrome   Imbalance 06/04/2017   lung ca dx'd 2010   Normal cardiac stress test    2007  PER PT   Osteoporosis    Rash, skin    RIGHT FOREARM/ HAND   Right wrist pain    MASS   Wears glasses     ALLERGIES:  is allergic to methotrexate, clindamycin hcl, amoxicillin, and protonix [pantoprazole sodium].  MEDICATIONS:  Current Outpatient Medications  Medication Sig Dispense Refill   b complex vitamins capsule Take 1 capsule by mouth daily.     bismuth subsalicylate (PEPTO BISMOL) 262 MG/15ML suspension Take 30 mLs by mouth daily as needed for indigestion or diarrhea or loose stools.     Calcium-Vitamin D ([employer] CALCIUM-600/VIT D PO) Take 1 tablet by mouth 2 (two) times daily.     cholecalciferol (VITAMIN D) 1000 UNITS tablet Take 2,000 Units by mouth daily.     erlotinib (TARCEVA) 100 MG tablet Take 1 tablet (100 mg total) by mouth daily. 30 tablet 3   hydrocortisone 2.5 % cream Apply 1 application. topically 2 (two) times daily.     lactase (LACTAID) 3000 units tablet Take 3,000 Units by mouth daily as needed (when eating dairy products).  loratadine (CLARITIN) 10 MG tablet Take 10 mg by mouth daily as needed for allergies.     Polyethyl Glycol-Propyl Glycol (SYSTANE OP) Place 1 drop into both eyes at bedtime as needed (for dry eyes.).     Probiotic Product (PROBIOTIC ACIDOPHILUS) CHEW Chew 1 tablet by mouth daily.     Simethicone (GAS-X PO) Take 1 tablet by mouth 2 (two) times daily as needed (for gas).      tacrolimus (PROGRAF) 1 MG capsule 1 mg 2 (two) times daily. Dissolve  1 mg capsule in 1/2 liter of water soaking a cotton ball and apply to wound twice daily     Tiotropium Bromide-Olodaterol  (STIOLTO RESPIMAT) 2.5-2.5 MCG/ACT AERS INHALE 2 PUFFS BY MOUTH ONCE DAILY (Patient taking differently: Inhale 2 each into the lungs daily. INHALE 2 PUFFS BY MOUTH ONCE DAILY) 4 g 0   vitamin B-12 (CYANOCOBALAMIN) 1000 MCG tablet Take 1 tablet by mouth daily.     No current facility-administered medications for this visit.    SURGICAL HISTORY:  Past Surgical History:  Procedure Laterality Date   ANAL FISSURE REPAIR  07/09/2011   INTERNAL SPHINCTEROTOMY   BENIGN EXCISION LEFT BREAST CENTRAL DUCT  02/1999   BREAST BIOPSY  01/31/2009   benign   BREAST EXCISIONAL BIOPSY Left 2004   benign   CHEST TUBE INSERTION Right 07/14/2014   Procedure: INSERTION PLEURAL DRAINAGE CATHETER RIGHT CHEST;  Surgeon: Grace Isaac, MD;  Location: [facility];  Service: Thoracic;  Laterality: Right;   EXCISION RIGHT WRIST MASS  2012   EXTRACORPOREAL SHOCK WAVE LITHOTRIPSY Left 06/01/2019   Procedure: EXTRACORPOREAL SHOCK WAVE LITHOTRIPSY (ESWL);  Surgeon: Cleon Gustin, MD;  Location: [facility];  Service: Urology;  Laterality: Left;   KNEE ARTHROSCOPY Right 1999   MASS EXCISION Right 03/11/2014   Procedure: RIGHT WRIST DEEP MASS EXCISION WITH CULTURE AND BIOSPY;  Surgeon: Linna Hoff, MD;  Location: [facility];  Service: Orthopedics;  Laterality: Right;   MICROLARYNGOSCOPY W/VOCAL CORD INJECTION Bilateral 02/08/2021   Procedure: MICROLARYNGOSCOPY WITH VOCAL CORD INJECTION;  Surgeon: Melida Quitter, MD;  Location: [facility];  Service: ENT;  Laterality: Bilateral;   REMOVAL OF PLEURAL DRAINAGE CATHETER Right 10/14/2014   Procedure: REMOVAL OF PLEURAL DRAINAGE CATHETER;  Surgeon: Grace Isaac, MD;  Location: [facility];  Service: Thoracic;  Laterality: Right;   TALC PLEURODESIS Right 09/16/2014   Procedure: TALC PLEURADESIS/ slurry;  Surgeon: Grace Isaac, MD;  Location: [facility];  Service: Thoracic;  Laterality: Right;   THORACOSCOPY  01-26-2009   w/   lung/  node biopsy's   THUMB ARTHROSCOPY Left     - removed bone spur   TONSILLECTOMY  AS CHILD   TOTAL ABDOMINAL HYSTERECTOMY W/ BILATERAL SALPINGOOPHORECTOMY  1982   W/  APPENDECTOMY   TRANSTHORACIC ECHOCARDIOGRAM  12-23-2008   NORMAL LV/  EF 65-70%/  MILD MR  &  TR   VAULT SUSPENSION PLUS CYSTOCELE REPAIR WITH GRAFT  06-13-2010    REVIEW OF SYSTEMS:  Constitutional: positive for fatigue Eyes: negative Ears, nose, mouth, throat, and face: negative Respiratory: positive for dyspnea on exertion Cardiovascular: negative Gastrointestinal: positive for diarrhea Genitourinary:negative Integument/breast: negative Hematologic/lymphatic: negative Musculoskeletal:negative Neurological: negative Behavioral/Psych: negative Endocrine: negative Allergic/Immunologic: negative   PHYSICAL EXAMINATION: General appearance: alert, cooperative, fatigued, and no distress Head: Normocephalic, without obvious abnormality, atraumatic Neck: no adenopathy, no JVD, supple, symmetrical, trachea midline, and thyroid not enlarged, symmetric, no tenderness/mass/nodules Lymph nodes: Cervical, supraclavicular, and axillary nodes normal. Resp: clear  to auscultation bilaterally Back: symmetric, no curvature. ROM normal. No CVA tenderness. Cardio: regular rate and rhythm, S1, S2 normal, no murmur, click, rub or gallop GI: soft, non-tender; bowel sounds normal; no masses,  no organomegaly Extremities: extremities normal, atraumatic, no cyanosis or edema Neurologic: Alert and oriented X 3, normal strength and tone. Normal symmetric reflexes. Normal coordination and gait  ECOG PERFORMANCE STATUS: 1 - Symptomatic but completely ambulatory  Blood pressure 120/77, pulse 77, temperature 97.9 F (36.6 C), temperature source Temporal, resp. rate 16, height 5\' 1"  (1.549 m), weight 102 lb 8 oz (46.5 kg), SpO2 98 %.  LABORATORY DATA: Lab Results  Component Value Date   WBC 4.9 01/17/2023   HGB 12.2 01/17/2023   HCT 35.3 (L) 01/17/2023   MCV 94.9 01/17/2023   PLT  266 01/17/2023      Chemistry      Component Value Date/Time   NA 139 12/27/2022 0409   NA 142 12/10/2017 1000   K 3.9 12/27/2022 0409   K 4.0 12/10/2017 1000   CL 109 12/27/2022 0409   CL 108 (H) 06/03/2013 0939   CO2 25 12/27/2022 0409   CO2 28 12/10/2017 1000   BUN 25 (H) 12/27/2022 0409   BUN 20.4 12/10/2017 1000   CREATININE 0.84 12/27/2022 0409   CREATININE 1.17 (H) 10/17/2022 1506   CREATININE 0.9 12/10/2017 1000      Component Value Date/Time   CALCIUM 7.4 (L) 12/27/2022 0409   CALCIUM 9.7 12/10/2017 1000   ALKPHOS 42 12/27/2022 0409   ALKPHOS 63 12/10/2017 1000   AST 54 (H) 12/27/2022 0409   AST 27 10/17/2022 1506   AST 25 12/10/2017 1000   ALT 50 (H) 12/27/2022 0409   ALT 26 10/17/2022 1506   ALT 17 12/10/2017 1000   BILITOT 0.5 12/27/2022 0409   BILITOT 0.8 10/17/2022 1506   BILITOT 1.05 12/10/2017 1000       RADIOGRAPHIC STUDIES: CT Angio Chest PE W and/or Wo Contrast  Result Date: 12/25/2022 CLINICAL DATA:  Rule out pulmonary embolus. Complains of congestion, fatigue, cough, diarrhea and weakness. History of non-small cell lung cancer. * Tracking Code: BO * EXAM: CT ANGIOGRAPHY CHEST WITH CONTRAST TECHNIQUE: Multidetector CT imaging of the chest was performed using the standard protocol during bolus administration of intravenous contrast. Multiplanar CT image reconstructions and MIPs were obtained to evaluate the vascular anatomy. RADIATION DOSE REDUCTION: This exam was performed according to the departmental dose-optimization program which includes automated exposure control, adjustment of the mA and/or kV according to patient size and/or use of iterative reconstruction technique. CONTRAST:  49mL OMNIPAQUE IOHEXOL 350 MG/ML SOLN COMPARISON:  07/16/2022 FINDINGS: Cardiovascular: Satisfactory opacification of the pulmonary arteries to the segmental level. No evidence of pulmonary embolism. Normal heart size. No pericardial effusion. Aortic atherosclerosis and  coronary artery calcifications. Mediastinum/Nodes: The trachea remains patent and midline. Unremarkable appearance of the esophagus. Thyroid gland appears normal. There is progressive subcarinal soft tissue noted, eccentric to the right, and extending posterior to the right hilum. This measures approximately 3.0 x 3.7, image 45/5. This appears contiguous with masslike architectural distortion and radiation change within the perihilar and paramediastinal right lung. Lungs/Pleura: Changes of external beam radiation within the right lung are again noted including masslike architectural distortion and fibrosis within the perihilar and paramediastinal right lung. Similar appearance of pleural thickening overlying the right lung. Part solid nodule within the apical segment of the left upper lobe is again noted, image 45/8. The internal solid component measures 0.9  cm. This is compared with 0.7 cm previously. Elongated nodular density within the anterior left upper lobe measures 2.3 x 0.5 cm, image 79/8. This is compared with 2.0 x 0.5 cm previously. Upper Abdomen: No acute abnormality. Musculoskeletal: No chest wall abnormality. No acute or significant osseous findings. Review of the MIP images confirms the above findings. IMPRESSION: 1. No evidence for acute pulmonary embolism. 2. Stable appearance of masslike architectural distortion and fibrosis within the perihilar and paramediastinal right lung compatible with external beam radiation. 3. There is progressive subcarinal soft tissue noted, eccentric to the right, and extending posterior to the right hilum. This appears contiguous with masslike architectural distortion and radiation change within the perihilar and paramediastinal right lung. Differential considerations include progressive changes of external beam radiation versus recurrent tumor. Consider more definitive characterization with PET-CT. 4. Mild increase in size of part solid nodule within the apical  segment of the left upper lobe. The other nodule within the left upper lobe which appears elongated in predominantly solid is also mildly increased in size in the interval. 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 5. Coronary artery calcifications. 6.  Aortic Atherosclerosis (ICD10-I70.0). Electronically Signed   By: Kerby Moors M.D.   On: 12/25/2022 13:43   DG Chest Portable 1 View  Result Date: 12/25/2022 CLINICAL DATA:  89 year old [name] presenting for evaluation of shortness of breath. EXAM: PORTABLE CHEST 1 VIEW COMPARISON:  September 21, 2020 FINDINGS: Heart size is stable. Mediastinal contours and hilar structures are also stable since previous imaging. Interval development of RIGHT-sided pleural effusion and basilar airspace disease. LEFT chest is clear. No pneumothorax. On limited assessment no acute skeletal process. IMPRESSION: Interval development of RIGHT-sided pleural effusion and basilar airspace disease. Correlate with any signs of infection. In a patient with history of non-small cell lung cancer would consider close follow-up to ensure improvement/resolution or with nonemergent chest CT as warranted. Electronically Signed   By: Zetta Bills M.D.   On: 12/25/2022 11:19    ASSESSMENT AND PLAN:  This is a very pleasant 88 years old white [name] with stage IIIB non-small cell lung cancer, adenocarcinoma with positive EGFR mutation diagnosed in January 2010. She has been on treatment with Tarceva for more than 9 years.   She is currently on Tarceva 100 mg p.o. daily status post 118  months. The patient has been tolerating her treatment with Tarceva fairly well except for intermittent diarrhea and occasional skin rash. She had CT scan of the chest performed during her hospitalization that showed no evidence for pulmonary embolism and in general showed stable disease with some progressive subcarinal soft tissue that is likely related to her  acute respiratory infection at that time but will need to continue to monitor her closely. I recommended for the patient to have repeat CT scan of the chest with contrast in around 3 months from now. She will continue her current treatment with Tarceva at the same dose. For the insomnia and depression, she is currently on Remeron but her dose was reduced to 7.5 mg p.o. daily. The patient was advised to call immediately if she has any other concerning symptoms in the interval. The patient voices understanding of current disease status and treatment options and is in agreement with the current care plan. All questions were answered. The patient knows to call the clinic with any problems, questions or concerns. We can certainly see the patient much sooner if necessary. The total time spent in the appointment was  30 minutes.  Disclaimer: This note was dictated with voice recognition software. Similar sounding words can inadvertently be transcribed and may not be corrected upon review.

## 2023-01-21 DIAGNOSIS — D638 Anemia in other chronic diseases classified elsewhere: Secondary | ICD-10-CM | POA: Diagnosis not present

## 2023-01-21 DIAGNOSIS — I10 Essential (primary) hypertension: Secondary | ICD-10-CM | POA: Diagnosis not present

## 2023-01-21 DIAGNOSIS — E43 Unspecified severe protein-calorie malnutrition: Secondary | ICD-10-CM | POA: Diagnosis not present

## 2023-01-21 DIAGNOSIS — F32A Depression, unspecified: Secondary | ICD-10-CM | POA: Diagnosis not present

## 2023-01-21 DIAGNOSIS — J069 Acute upper respiratory infection, unspecified: Secondary | ICD-10-CM | POA: Diagnosis not present

## 2023-01-21 DIAGNOSIS — Z85118 Personal history of other malignant neoplasm of bronchus and lung: Secondary | ICD-10-CM | POA: Diagnosis not present

## 2023-01-21 DIAGNOSIS — Z9181 History of falling: Secondary | ICD-10-CM | POA: Diagnosis not present

## 2023-01-21 DIAGNOSIS — J440 Chronic obstructive pulmonary disease with acute lower respiratory infection: Secondary | ICD-10-CM | POA: Diagnosis not present

## 2023-01-21 DIAGNOSIS — I700 Atherosclerosis of aorta: Secondary | ICD-10-CM | POA: Diagnosis not present

## 2023-01-23 DIAGNOSIS — F32A Depression, unspecified: Secondary | ICD-10-CM | POA: Diagnosis not present

## 2023-01-23 DIAGNOSIS — I700 Atherosclerosis of aorta: Secondary | ICD-10-CM | POA: Diagnosis not present

## 2023-01-23 DIAGNOSIS — Z85118 Personal history of other malignant neoplasm of bronchus and lung: Secondary | ICD-10-CM | POA: Diagnosis not present

## 2023-01-23 DIAGNOSIS — Z9181 History of falling: Secondary | ICD-10-CM | POA: Diagnosis not present

## 2023-01-23 DIAGNOSIS — D638 Anemia in other chronic diseases classified elsewhere: Secondary | ICD-10-CM | POA: Diagnosis not present

## 2023-01-23 DIAGNOSIS — J069 Acute upper respiratory infection, unspecified: Secondary | ICD-10-CM | POA: Diagnosis not present

## 2023-01-23 DIAGNOSIS — I10 Essential (primary) hypertension: Secondary | ICD-10-CM | POA: Diagnosis not present

## 2023-01-23 DIAGNOSIS — E43 Unspecified severe protein-calorie malnutrition: Secondary | ICD-10-CM | POA: Diagnosis not present

## 2023-01-23 DIAGNOSIS — J440 Chronic obstructive pulmonary disease with acute lower respiratory infection: Secondary | ICD-10-CM | POA: Diagnosis not present

## 2023-01-28 DIAGNOSIS — J069 Acute upper respiratory infection, unspecified: Secondary | ICD-10-CM | POA: Diagnosis not present

## 2023-01-28 DIAGNOSIS — D638 Anemia in other chronic diseases classified elsewhere: Secondary | ICD-10-CM | POA: Diagnosis not present

## 2023-01-28 DIAGNOSIS — Z9181 History of falling: Secondary | ICD-10-CM | POA: Diagnosis not present

## 2023-01-28 DIAGNOSIS — I700 Atherosclerosis of aorta: Secondary | ICD-10-CM | POA: Diagnosis not present

## 2023-01-28 DIAGNOSIS — E43 Unspecified severe protein-calorie malnutrition: Secondary | ICD-10-CM | POA: Diagnosis not present

## 2023-01-28 DIAGNOSIS — J440 Chronic obstructive pulmonary disease with acute lower respiratory infection: Secondary | ICD-10-CM | POA: Diagnosis not present

## 2023-01-28 DIAGNOSIS — F32A Depression, unspecified: Secondary | ICD-10-CM | POA: Diagnosis not present

## 2023-01-28 DIAGNOSIS — Z85118 Personal history of other malignant neoplasm of bronchus and lung: Secondary | ICD-10-CM | POA: Diagnosis not present

## 2023-01-28 DIAGNOSIS — I10 Essential (primary) hypertension: Secondary | ICD-10-CM | POA: Diagnosis not present

## 2023-02-04 ENCOUNTER — Telehealth: Payer: Self-pay

## 2023-02-04 DIAGNOSIS — J440 Chronic obstructive pulmonary disease with acute lower respiratory infection: Secondary | ICD-10-CM | POA: Diagnosis not present

## 2023-02-04 DIAGNOSIS — Z85118 Personal history of other malignant neoplasm of bronchus and lung: Secondary | ICD-10-CM | POA: Diagnosis not present

## 2023-02-04 DIAGNOSIS — I700 Atherosclerosis of aorta: Secondary | ICD-10-CM | POA: Diagnosis not present

## 2023-02-04 DIAGNOSIS — F32A Depression, unspecified: Secondary | ICD-10-CM | POA: Diagnosis not present

## 2023-02-04 DIAGNOSIS — D638 Anemia in other chronic diseases classified elsewhere: Secondary | ICD-10-CM | POA: Diagnosis not present

## 2023-02-04 DIAGNOSIS — I10 Essential (primary) hypertension: Secondary | ICD-10-CM | POA: Diagnosis not present

## 2023-02-04 DIAGNOSIS — E43 Unspecified severe protein-calorie malnutrition: Secondary | ICD-10-CM | POA: Diagnosis not present

## 2023-02-04 DIAGNOSIS — Z9181 History of falling: Secondary | ICD-10-CM | POA: Diagnosis not present

## 2023-02-04 DIAGNOSIS — J069 Acute upper respiratory infection, unspecified: Secondary | ICD-10-CM | POA: Diagnosis not present

## 2023-02-04 NOTE — Telephone Encounter (Signed)
Zilretta VOB initiated via [employer] portal.

## 2023-02-04 NOTE — Telephone Encounter (Signed)
Patient was not able to make it in December for zilretta injection. Patient wants to make an appointment to get this injection in her right knee. Patient informed we will have to re-run through her insurance and we can call back to schedule when authorized.   Need to call patients daughter Keven Soucy at [phone] to schedule when authorized.

## 2023-02-06 DIAGNOSIS — F32A Depression, unspecified: Secondary | ICD-10-CM | POA: Diagnosis not present

## 2023-02-06 DIAGNOSIS — D638 Anemia in other chronic diseases classified elsewhere: Secondary | ICD-10-CM | POA: Diagnosis not present

## 2023-02-06 DIAGNOSIS — I10 Essential (primary) hypertension: Secondary | ICD-10-CM | POA: Diagnosis not present

## 2023-02-06 DIAGNOSIS — E43 Unspecified severe protein-calorie malnutrition: Secondary | ICD-10-CM | POA: Diagnosis not present

## 2023-02-06 DIAGNOSIS — I700 Atherosclerosis of aorta: Secondary | ICD-10-CM | POA: Diagnosis not present

## 2023-02-06 DIAGNOSIS — J440 Chronic obstructive pulmonary disease with acute lower respiratory infection: Secondary | ICD-10-CM | POA: Diagnosis not present

## 2023-02-06 DIAGNOSIS — Z9181 History of falling: Secondary | ICD-10-CM | POA: Diagnosis not present

## 2023-02-06 DIAGNOSIS — Z85118 Personal history of other malignant neoplasm of bronchus and lung: Secondary | ICD-10-CM | POA: Diagnosis not present

## 2023-02-06 DIAGNOSIS — J069 Acute upper respiratory infection, unspecified: Secondary | ICD-10-CM | POA: Diagnosis not present

## 2023-02-06 NOTE — Telephone Encounter (Signed)
No prior authorization, medical notes or referrals needed. Patient has a [employer] plan with an effective date of 12/24/2022. Plan follows Medicare guidelines. Patient responsibility for [phone] Orvan Seen) will be 20% with the remaining covered at 80% by the payer at the contracted rate. Patient is responsible for a $20 copay for CPT code 20611 with the remaining covered at 100% by the payer at contracted rate. Deductibles do not apply to these services. Patient has a $20 copay whether or not an office visit is billed. Only one copay applies per date of service. Patient has an out of pocket maximum of $3600 and has accumulated $597.69. If out of pocket is met, coverage goes to 100% and copays will no longer apply. Patient has a calendar year policy starting from December 24, 2022 to December 24, 2023

## 2023-02-06 NOTE — Telephone Encounter (Signed)
Pt is ready for scheduling on or after 02/06/23  Primary: UHC Medicare  Zilretta co-insurance: 20% U/S guidance ([id number]) co-insurance: $20 OV co-pay: $20 OOP Max: $597.69 of $3600 met, once met Zilretta will be covered at 100%  Secondary: n/a Zilretta co-insurance:  U/S guidance ([id number]) co-insurance:   Deductible: does not apply  Prior Auth: NOT required PA# Valid:     ** This summary of benefits is an estimation of the patient's out-of-pocket cost. Exact cost may very based on individual plan coverage.

## 2023-02-06 NOTE — Telephone Encounter (Signed)
Scheduled for 02/08/2023, in stock and labelled for pt.

## 2023-02-07 NOTE — Progress Notes (Signed)
   I, Josepha Pigg, CMA acting as a scribe for Lynne Leader, MD.  KEAMBER MACFADDEN is a 88 y.o. female who presents to [place] at [facility] today for continued right knee pain and Zilretta injection.  Patient was last seen by Dr. Georgina Snell on 09/20/2022 and was given a right knee Zilretta injection.  Today, patient reports worsening right knee pain. [place] in Oct at the grocery store, slid under the cart. Was in the hospital in Jan 2024 for bronchial infection. Denies swelling, mechanical sx. Sx present at medial aspect of the knee, better first thing in the morning, worsens throughout the day. Topical Voltaren with short-term relief.   Dx imaging: 12/21/19 R knee XR   Pertinent review of systems: No fevers or chills  Relevant historical information: Recent hospitalization   Exam:  BP (!) 176/94   Pulse 100   Ht 5\' 1"  (1.549 m)   Wt 102 lb 12.8 oz (46.6 kg)   BMI 19.42 kg/m  General: Well Developed, thin appearing and in no acute distress.   MSK: Right knee mild effusion normal motion with crepitation.    Lab and Radiology Results  Zilretta injection right knee Procedure: Real-time Ultrasound Guided Injection of right knee superior lateral patellar space Device: Philips Affiniti 50G Images permanently stored and available for review in PACS Verbal informed consent obtained.  Discussed risks and benefits of procedure. Warned about infection, bleeding, hyperglycemia damage to structures among others. Patient expresses understanding and agreement Time-out conducted.   Noted no overlying erythema, induration, or other signs of local infection.   Skin prepped in a sterile fashion.   Local anesthesia: Topical Ethyl chloride.   With sterile technique and under real time ultrasound guidance: Zilretta 32 mg injected into knee joint. Fluid seen entering the joint capsule.   Completed without difficulty   Advised to call if fevers/chills, erythema, induration, drainage, or  persistent bleeding.   Images permanently stored and available for review in the ultrasound unit.  Impression: Technically successful ultrasound guided injection. Lot number: [id number]       Assessment and Plan: 88 y.o. female with right knee pain due to DJD exacerbation.  This is a chronic issue with acute exacerbation.  Plan for Zilretta injection today.  We can repeat this injection every 3 months if needed.  Recheck back as needed. Encouraged return of exercise and ambulation.  Rebuilding reserve of fitness following her hospitalization will be important.  PDMP not reviewed this encounter. Orders Placed This Encounter  Procedures   [place] LIMITED JOINT SPACE STRUCTURES LOW RIGHT(NO LINKED CHARGES)    Order Specific Question:   Reason for Exam (SYMPTOM  OR DIAGNOSIS REQUIRED)    Answer:   Right knee pain    Order Specific Question:   Preferred imaging location?    Answer:   [id number]   Meds ordered this encounter  Medications   Triamcinolone Acetonide (ZILRETTA) intra-articular injection 32 mg     Discussed warning signs or symptoms. Please see discharge instructions. Patient expresses understanding.   The above documentation has been reviewed and is accurate and complete Lynne Leader, M.D.

## 2023-02-08 ENCOUNTER — Ambulatory Visit: Payer: Self-pay

## 2023-02-08 ENCOUNTER — Ambulatory Visit (INDEPENDENT_AMBULATORY_CARE_PROVIDER_SITE_OTHER): Payer: Medicare Other | Admitting: Family Medicine

## 2023-02-08 ENCOUNTER — Encounter: Payer: Self-pay | Admitting: Family Medicine

## 2023-02-08 VITALS — BP 176/94 | HR 100 | Ht 61.0 in | Wt 102.8 lb

## 2023-02-08 DIAGNOSIS — M25561 Pain in right knee: Secondary | ICD-10-CM | POA: Diagnosis not present

## 2023-02-08 DIAGNOSIS — M1711 Unilateral primary osteoarthritis, right knee: Secondary | ICD-10-CM

## 2023-02-08 DIAGNOSIS — G8929 Other chronic pain: Secondary | ICD-10-CM | POA: Diagnosis not present

## 2023-02-08 MED ORDER — TRIAMCINOLONE ACETONIDE 32 MG IX SRER
32.0000 mg | Freq: Once | INTRA_ARTICULAR | Status: AC
  Administered 2023-02-08: 32 mg via INTRA_ARTICULAR

## 2023-02-08 NOTE — Patient Instructions (Addendum)
Thank you for coming in today.   You received an injection today. Seek immediate medical attention if the joint becomes red, extremely painful, or is oozing fluid.   Reach out to your ENT now to schedule  We can repeat this injection in 3 months  Check back as needed

## 2023-02-08 NOTE — Telephone Encounter (Signed)
Zilretta inj completed today.

## 2023-02-11 DIAGNOSIS — I700 Atherosclerosis of aorta: Secondary | ICD-10-CM | POA: Diagnosis not present

## 2023-02-11 DIAGNOSIS — J440 Chronic obstructive pulmonary disease with acute lower respiratory infection: Secondary | ICD-10-CM | POA: Diagnosis not present

## 2023-02-11 DIAGNOSIS — J069 Acute upper respiratory infection, unspecified: Secondary | ICD-10-CM | POA: Diagnosis not present

## 2023-02-11 DIAGNOSIS — F32A Depression, unspecified: Secondary | ICD-10-CM | POA: Diagnosis not present

## 2023-02-11 DIAGNOSIS — I10 Essential (primary) hypertension: Secondary | ICD-10-CM | POA: Diagnosis not present

## 2023-02-11 DIAGNOSIS — D638 Anemia in other chronic diseases classified elsewhere: Secondary | ICD-10-CM | POA: Diagnosis not present

## 2023-02-11 DIAGNOSIS — E43 Unspecified severe protein-calorie malnutrition: Secondary | ICD-10-CM | POA: Diagnosis not present

## 2023-02-11 DIAGNOSIS — Z9181 History of falling: Secondary | ICD-10-CM | POA: Diagnosis not present

## 2023-02-11 DIAGNOSIS — Z85118 Personal history of other malignant neoplasm of bronchus and lung: Secondary | ICD-10-CM | POA: Diagnosis not present

## 2023-02-13 DIAGNOSIS — E43 Unspecified severe protein-calorie malnutrition: Secondary | ICD-10-CM | POA: Diagnosis not present

## 2023-02-13 DIAGNOSIS — Z9181 History of falling: Secondary | ICD-10-CM | POA: Diagnosis not present

## 2023-02-13 DIAGNOSIS — Z85118 Personal history of other malignant neoplasm of bronchus and lung: Secondary | ICD-10-CM | POA: Diagnosis not present

## 2023-02-13 DIAGNOSIS — J069 Acute upper respiratory infection, unspecified: Secondary | ICD-10-CM | POA: Diagnosis not present

## 2023-02-13 DIAGNOSIS — D638 Anemia in other chronic diseases classified elsewhere: Secondary | ICD-10-CM | POA: Diagnosis not present

## 2023-02-13 DIAGNOSIS — F32A Depression, unspecified: Secondary | ICD-10-CM | POA: Diagnosis not present

## 2023-02-13 DIAGNOSIS — I10 Essential (primary) hypertension: Secondary | ICD-10-CM | POA: Diagnosis not present

## 2023-02-13 DIAGNOSIS — J440 Chronic obstructive pulmonary disease with acute lower respiratory infection: Secondary | ICD-10-CM | POA: Diagnosis not present

## 2023-02-13 DIAGNOSIS — I700 Atherosclerosis of aorta: Secondary | ICD-10-CM | POA: Diagnosis not present

## 2023-02-18 DIAGNOSIS — D638 Anemia in other chronic diseases classified elsewhere: Secondary | ICD-10-CM | POA: Diagnosis not present

## 2023-02-18 DIAGNOSIS — I10 Essential (primary) hypertension: Secondary | ICD-10-CM | POA: Diagnosis not present

## 2023-02-18 DIAGNOSIS — Z9181 History of falling: Secondary | ICD-10-CM | POA: Diagnosis not present

## 2023-02-18 DIAGNOSIS — E43 Unspecified severe protein-calorie malnutrition: Secondary | ICD-10-CM | POA: Diagnosis not present

## 2023-02-18 DIAGNOSIS — I700 Atherosclerosis of aorta: Secondary | ICD-10-CM | POA: Diagnosis not present

## 2023-02-18 DIAGNOSIS — Z85118 Personal history of other malignant neoplasm of bronchus and lung: Secondary | ICD-10-CM | POA: Diagnosis not present

## 2023-02-18 DIAGNOSIS — J440 Chronic obstructive pulmonary disease with acute lower respiratory infection: Secondary | ICD-10-CM | POA: Diagnosis not present

## 2023-02-18 DIAGNOSIS — J069 Acute upper respiratory infection, unspecified: Secondary | ICD-10-CM | POA: Diagnosis not present

## 2023-02-18 DIAGNOSIS — F32A Depression, unspecified: Secondary | ICD-10-CM | POA: Diagnosis not present

## 2023-02-20 ENCOUNTER — Other Ambulatory Visit: Payer: Self-pay | Admitting: Family Medicine

## 2023-02-20 DIAGNOSIS — Z1231 Encounter for screening mammogram for malignant neoplasm of breast: Secondary | ICD-10-CM

## 2023-02-26 DIAGNOSIS — D638 Anemia in other chronic diseases classified elsewhere: Secondary | ICD-10-CM | POA: Diagnosis not present

## 2023-02-26 DIAGNOSIS — E43 Unspecified severe protein-calorie malnutrition: Secondary | ICD-10-CM | POA: Diagnosis not present

## 2023-02-26 DIAGNOSIS — I10 Essential (primary) hypertension: Secondary | ICD-10-CM | POA: Diagnosis not present

## 2023-02-26 DIAGNOSIS — J440 Chronic obstructive pulmonary disease with acute lower respiratory infection: Secondary | ICD-10-CM | POA: Diagnosis not present

## 2023-02-26 DIAGNOSIS — Z9181 History of falling: Secondary | ICD-10-CM | POA: Diagnosis not present

## 2023-02-26 DIAGNOSIS — I700 Atherosclerosis of aorta: Secondary | ICD-10-CM | POA: Diagnosis not present

## 2023-02-26 DIAGNOSIS — J069 Acute upper respiratory infection, unspecified: Secondary | ICD-10-CM | POA: Diagnosis not present

## 2023-02-26 DIAGNOSIS — Z85118 Personal history of other malignant neoplasm of bronchus and lung: Secondary | ICD-10-CM | POA: Diagnosis not present

## 2023-02-26 DIAGNOSIS — F32A Depression, unspecified: Secondary | ICD-10-CM | POA: Diagnosis not present

## 2023-02-28 DIAGNOSIS — M810 Age-related osteoporosis without current pathological fracture: Secondary | ICD-10-CM | POA: Diagnosis not present

## 2023-02-28 DIAGNOSIS — K219 Gastro-esophageal reflux disease without esophagitis: Secondary | ICD-10-CM | POA: Diagnosis not present

## 2023-02-28 DIAGNOSIS — I2510 Atherosclerotic heart disease of native coronary artery without angina pectoris: Secondary | ICD-10-CM | POA: Diagnosis not present

## 2023-02-28 DIAGNOSIS — G8929 Other chronic pain: Secondary | ICD-10-CM | POA: Diagnosis not present

## 2023-02-28 DIAGNOSIS — J449 Chronic obstructive pulmonary disease, unspecified: Secondary | ICD-10-CM | POA: Diagnosis not present

## 2023-02-28 DIAGNOSIS — E782 Mixed hyperlipidemia: Secondary | ICD-10-CM | POA: Diagnosis not present

## 2023-03-05 ENCOUNTER — Telehealth: Payer: Self-pay | Admitting: Internal Medicine

## 2023-03-05 NOTE — Telephone Encounter (Signed)
Called patient regarding upcoming April appointments, patient is notified.

## 2023-03-07 ENCOUNTER — Other Ambulatory Visit: Payer: Self-pay

## 2023-03-07 ENCOUNTER — Telehealth: Payer: Self-pay | Admitting: Medical Oncology

## 2023-03-07 ENCOUNTER — Telehealth: Payer: Self-pay | Admitting: Pharmacy Technician

## 2023-03-07 ENCOUNTER — Other Ambulatory Visit (HOSPITAL_COMMUNITY): Payer: Self-pay

## 2023-03-07 ENCOUNTER — Other Ambulatory Visit: Payer: Self-pay | Admitting: Medical Oncology

## 2023-03-07 DIAGNOSIS — C3490 Malignant neoplasm of unspecified part of unspecified bronchus or lung: Secondary | ICD-10-CM

## 2023-03-07 MED ORDER — ERLOTINIB HCL 100 MG PO TABS
100.0000 mg | ORAL_TABLET | Freq: Every day | ORAL | 3 refills | Status: DC
  Filled 2023-03-07: qty 30, 30d supply, fill #0

## 2023-03-07 MED ORDER — ERLOTINIB HCL 100 MG PO TABS
100.0000 mg | ORAL_TABLET | Freq: Every day | ORAL | 3 refills | Status: DC
  Filled 2023-03-07: qty 30, 30d supply, fill #0
  Filled 2023-04-03: qty 30, 30d supply, fill #1
  Filled 2023-04-29: qty 30, 30d supply, fill #2
  Filled 2023-05-28: qty 30, 30d supply, fill #3

## 2023-03-07 NOTE — Telephone Encounter (Signed)
Oral Oncology Patient Advocate Encounter  Prior Authorization for Erlotinib (Tarceva) has been approved.    PA#  [id number] Effective dates: 03/06/23 through 12/24/23  Patients co-pay is $23.77.    Lady Deutscher, CPhT-Adv Oncology Pharmacy Patient [place] Direct Number: [phone]  Fax: [phone]

## 2023-03-07 NOTE — Telephone Encounter (Signed)
Pt dtr notified of erlotinib Rx sent to [facility].  [employer] stopped supporting Tarceva effective 01/2023 (originally they stopped branded products in 2021, but must of had enough to support her until now) -

## 2023-03-07 NOTE — Telephone Encounter (Signed)
Oral Oncology Patient Advocate Encounter   Received notification that prior authorization for Tarceva is required.   PA submitted on 03/07/23 Key [id number] Status is pending     Dana [name], CPhT-Adv Oncology Pharmacy Patient [place] Direct Number: [phone]  Fax: [phone]

## 2023-03-11 ENCOUNTER — Other Ambulatory Visit: Payer: Self-pay

## 2023-03-12 ENCOUNTER — Other Ambulatory Visit (HOSPITAL_COMMUNITY): Payer: Self-pay

## 2023-03-12 DIAGNOSIS — C4442 Squamous cell carcinoma of skin of scalp and neck: Secondary | ICD-10-CM | POA: Diagnosis not present

## 2023-03-12 DIAGNOSIS — L820 Inflamed seborrheic keratosis: Secondary | ICD-10-CM | POA: Diagnosis not present

## 2023-03-18 DIAGNOSIS — Z681 Body mass index (BMI) 19 or less, adult: Secondary | ICD-10-CM | POA: Diagnosis not present

## 2023-03-18 DIAGNOSIS — D8481 Immunodeficiency due to conditions classified elsewhere: Secondary | ICD-10-CM | POA: Diagnosis not present

## 2023-03-18 DIAGNOSIS — C3490 Malignant neoplasm of unspecified part of unspecified bronchus or lung: Secondary | ICD-10-CM | POA: Diagnosis not present

## 2023-03-18 DIAGNOSIS — M25561 Pain in right knee: Secondary | ICD-10-CM | POA: Diagnosis not present

## 2023-03-26 DIAGNOSIS — H52223 Regular astigmatism, bilateral: Secondary | ICD-10-CM | POA: Diagnosis not present

## 2023-03-26 DIAGNOSIS — H2513 Age-related nuclear cataract, bilateral: Secondary | ICD-10-CM | POA: Diagnosis not present

## 2023-03-26 DIAGNOSIS — H04123 Dry eye syndrome of bilateral lacrimal glands: Secondary | ICD-10-CM | POA: Diagnosis not present

## 2023-03-26 DIAGNOSIS — H02052 Trichiasis without entropian right lower eyelid: Secondary | ICD-10-CM | POA: Diagnosis not present

## 2023-03-26 DIAGNOSIS — H524 Presbyopia: Secondary | ICD-10-CM | POA: Diagnosis not present

## 2023-03-26 DIAGNOSIS — H35361 Drusen (degenerative) of macula, right eye: Secondary | ICD-10-CM | POA: Diagnosis not present

## 2023-03-26 DIAGNOSIS — H5203 Hypermetropia, bilateral: Secondary | ICD-10-CM | POA: Diagnosis not present

## 2023-03-28 DIAGNOSIS — L820 Inflamed seborrheic keratosis: Secondary | ICD-10-CM | POA: Diagnosis not present

## 2023-03-28 DIAGNOSIS — L570 Actinic keratosis: Secondary | ICD-10-CM | POA: Diagnosis not present

## 2023-03-28 DIAGNOSIS — D044 Carcinoma in situ of skin of scalp and neck: Secondary | ICD-10-CM | POA: Diagnosis not present

## 2023-04-02 DIAGNOSIS — C4442 Squamous cell carcinoma of skin of scalp and neck: Secondary | ICD-10-CM | POA: Diagnosis not present

## 2023-04-03 ENCOUNTER — Other Ambulatory Visit (HOSPITAL_COMMUNITY): Payer: Self-pay

## 2023-04-04 ENCOUNTER — Other Ambulatory Visit (HOSPITAL_COMMUNITY): Payer: Self-pay

## 2023-04-04 ENCOUNTER — Ambulatory Visit: Payer: Medicare Other

## 2023-04-08 DIAGNOSIS — J383 Other diseases of vocal cords: Secondary | ICD-10-CM | POA: Diagnosis not present

## 2023-04-08 DIAGNOSIS — R490 Dysphonia: Secondary | ICD-10-CM | POA: Diagnosis not present

## 2023-04-11 DIAGNOSIS — D044 Carcinoma in situ of skin of scalp and neck: Secondary | ICD-10-CM | POA: Diagnosis not present

## 2023-04-16 ENCOUNTER — Inpatient Hospital Stay: Payer: Medicare Other | Attending: Internal Medicine

## 2023-04-16 ENCOUNTER — Ambulatory Visit (HOSPITAL_COMMUNITY)
Admission: RE | Admit: 2023-04-16 | Discharge: 2023-04-16 | Disposition: A | Discharge status: Home / Self Care | Payer: Medicare Other | Source: Ambulatory Visit | Attending: Internal Medicine | Admitting: Internal Medicine

## 2023-04-16 ENCOUNTER — Other Ambulatory Visit: Payer: Self-pay

## 2023-04-16 DIAGNOSIS — R918 Other nonspecific abnormal finding of lung field: Secondary | ICD-10-CM | POA: Diagnosis not present

## 2023-04-16 DIAGNOSIS — C3490 Malignant neoplasm of unspecified part of unspecified bronchus or lung: Secondary | ICD-10-CM | POA: Insufficient documentation

## 2023-04-16 DIAGNOSIS — Z79899 Other long term (current) drug therapy: Secondary | ICD-10-CM | POA: Insufficient documentation

## 2023-04-16 DIAGNOSIS — C3492 Malignant neoplasm of unspecified part of left bronchus or lung: Secondary | ICD-10-CM | POA: Insufficient documentation

## 2023-04-16 LAB — CMP (CANCER CENTER ONLY)
ALT: 24 U/L (ref 0–44)
AST: 30 U/L (ref 15–41)
Albumin: 3.8 g/dL (ref 3.5–5.0)
Alkaline Phosphatase: 51 U/L (ref 38–126)
Anion gap: 7 (ref 5–15)
BUN: 24 mg/dL — ABNORMAL HIGH (ref 8–23)
CO2: 31 mmol/L (ref 22–32)
Calcium: 9.4 mg/dL (ref 8.9–10.3)
Chloride: 104 mmol/L (ref 98–111)
Creatinine: 1.02 mg/dL — ABNORMAL HIGH (ref 0.44–1.00)
GFR, Estimated: 54 mL/min — ABNORMAL LOW (ref >60)
Glucose, Bld: 92 mg/dL (ref 70–99)
Potassium: 4.2 mmol/L (ref 3.5–5.1)
Sodium: 142 mmol/L (ref 135–145)
Total Bilirubin: 1 mg/dL (ref 0.3–1.2)
Total Protein: 6.3 g/dL — ABNORMAL LOW (ref 6.5–8.1)

## 2023-04-16 LAB — CBC WITH DIFFERENTIAL (CANCER CENTER ONLY)
Abs Immature Granulocytes: 0.01 10*3/uL (ref 0.00–0.07)
Basophils Absolute: 0 10*3/uL (ref 0.0–0.1)
Basophils Relative: 0 %
Eosinophils Absolute: 0.2 10*3/uL (ref 0.0–0.5)
Eosinophils Relative: 3 %
HCT: 40.1 % (ref 36.0–46.0)
Hemoglobin: 13.8 g/dL (ref 12.0–15.0)
Immature Granulocytes: 0 %
Lymphocytes Relative: 12 %
Lymphs Abs: 0.7 10*3/uL (ref 0.7–4.0)
MCH: 32.9 pg (ref 26.0–34.0)
MCHC: 34.4 g/dL (ref 30.0–36.0)
MCV: 95.5 fL (ref 80.0–100.0)
Monocytes Absolute: 0.6 10*3/uL (ref 0.1–1.0)
Monocytes Relative: 10 %
Neutro Abs: 4.2 10*3/uL (ref 1.7–7.7)
Neutrophils Relative %: 75 %
Platelet Count: 306 10*3/uL (ref 150–400)
RBC: 4.2 MIL/uL (ref 3.87–5.11)
RDW: 14 % (ref 11.5–15.5)
WBC Count: 5.7 10*3/uL (ref 4.0–10.5)
nRBC: 0 % (ref 0.0–0.2)

## 2023-04-16 MED ORDER — SODIUM CHLORIDE (PF) 0.9 % IJ SOLN
INTRAMUSCULAR | Status: AC
  Filled 2023-04-16: qty 50

## 2023-04-16 MED ORDER — IOHEXOL 300 MG/ML  SOLN
75.0000 mL | Freq: Once | INTRAMUSCULAR | Status: AC | PRN
  Administered 2023-04-16: 75 mL via INTRAVENOUS

## 2023-04-16 NOTE — Progress Notes (Unsigned)
[facility] OFFICE PROGRESS NOTE  Gweneth Dimitri, MD [address] [place] [address]  DIAGNOSIS: Stage IIIB (T2a, N3, M0) non-small cell lung cancer, adenocarcinoma with positive EGFR mutation diagnosed in January 2010.   PRIOR THERAPY: 1) Status post concurrent chemoradiation with weekly carboplatin and paclitaxel; last dose given March 21, 2009.  2) Tarceva at 150 mg p.o. daily, status post approximately 48 months of treatment, discontinued secondary to persistent diarrhea.  3) status post right Pleurx catheter placement for right nonmalignant pleural effusion.  CURRENT THERAPY: Tarceva 100 mg by mouth daily started 03/19/2013, status post 121 months of treatment.   INTERVAL HISTORY: LATARSHIA JERSEY 88 y.o. female returns to the clinic today for a follow-up visit.  The patient was last seen by Dr. Arbutus Ped on 01/17/2023.  She is currently on dose reduced Tarceva for her non-small cell lung cancer.  She has been on this since 2014.  She tolerates this well without any adverse side effects except occasional manageable diarrhea. However, her diarrhea has improved with removing certain foods from her diet.  Today she denies any fever, chills, night sweats, or unexplained weight loss.  She is currently on 7.5 mg of Remeron due to insomnia and depression.  She sees a Armed forces operational officer at [facility] and mentions she has two squamous cell carcinomas of the skin that were treated. She denies any chest pain cough, or hemoptysis.  She denies dyspnea as she does not exert herself. She does try to be as active as she can but has a bad knee which does limit her activity. She reports fatigue. Denies any nausea, vomiting, or constipation.  She recently had a restaging CT scan performed.  She is here today for evaluation, repeat blood work, and to review her scan results.   MEDICAL HISTORY: Past Medical History:  Diagnosis Date   Arthritis    HANDS,  WRISTS   COPD (chronic obstructive  pulmonary disease) (HCC)    Depression 04/04/2017   Dyspnea on exertion    Encounter for therapeutic drug monitoring 10/01/2016   GERD (gastroesophageal reflux disease)    at times   Hemorrhoid    History of anal fissures    History of kidney stones    History of lung cancer ONCOLOGIST--  DR Arbutus Ped--  LAST CT ,  NO RECURRENCE OR METS   DX JAN 2010 --  STAGE IIIA  NON-SMALL CELL ADENOCARCINOMA (RIGHT MIDDLE LOBE)---  S/P  CHEMORADIATIO THERAPY  (COMPLETE 03-21-2009)   HTN (hypertension) 10/10/2017   white coat syndrome   Imbalance 06/04/2017   lung ca dx'd 2010   Normal cardiac stress test    2007  PER PT   Osteoporosis    Rash, skin    RIGHT FOREARM/ HAND   Right wrist pain    MASS   Wears glasses     ALLERGIES:  is allergic to methotrexate, clindamycin hcl, amoxicillin, and protonix [pantoprazole sodium].  MEDICATIONS:  Current Outpatient Medications  Medication Sig Dispense Refill   b complex vitamins capsule Take 1 capsule by mouth daily.     bismuth subsalicylate (PEPTO BISMOL) 262 MG/15ML suspension Take 30 mLs by mouth daily as needed for indigestion or diarrhea or loose stools.     Calcium-Vitamin D ([employer] CALCIUM-600/VIT D PO) Take 1 tablet by mouth 2 (two) times daily.     cholecalciferol (VITAMIN D) 1000 UNITS tablet Take 2,000 Units by mouth daily.     erlotinib (TARCEVA) 100 MG tablet Take 1 tablet (100  mg total) by mouth daily. 30 tablet 3   hydrocortisone 2.5 % cream Apply 1 application. topically 2 (two) times daily.     lactase (LACTAID) 3000 units tablet Take 3,000 Units by mouth daily as needed (when eating dairy products).     loratadine (CLARITIN) 10 MG tablet Take 10 mg by mouth daily as needed for allergies.     meloxicam (MOBIC) 15 MG tablet Take 15 mg by mouth daily.     Polyethyl Glycol-Propyl Glycol (SYSTANE OP) Place 1 drop into both eyes at bedtime as needed (for dry eyes.).     Probiotic Product (PROBIOTIC ACIDOPHILUS) CHEW Chew 1 tablet by mouth  daily.     psyllium (METAMUCIL) 58.6 % packet Take 1 packet by mouth daily.     Simethicone (GAS-X PO) Take 1 tablet by mouth 2 (two) times daily as needed (for gas).      tacrolimus (PROGRAF) 1 MG capsule 1 mg 2 (two) times daily. Dissolve  1 mg capsule in 1/2 liter of water soaking a cotton ball and apply to wound twice daily     Tiotropium Bromide-Olodaterol (STIOLTO RESPIMAT) 2.5-2.5 MCG/ACT AERS INHALE 2 PUFFS BY MOUTH ONCE DAILY (Patient taking differently: Inhale 2 each into the lungs daily. INHALE 2 PUFFS BY MOUTH ONCE DAILY) 4 g 0   vitamin B-12 (CYANOCOBALAMIN) 1000 MCG tablet Take 1 tablet by mouth daily.     No current facility-administered medications for this visit.    SURGICAL HISTORY:  Past Surgical History:  Procedure Laterality Date   ANAL FISSURE REPAIR  07/09/2011   INTERNAL SPHINCTEROTOMY   BENIGN EXCISION LEFT BREAST CENTRAL DUCT  02/1999   BREAST BIOPSY  01/31/2009   benign   BREAST EXCISIONAL BIOPSY Left 2004   benign   CHEST TUBE INSERTION Right 07/14/2014   Procedure: INSERTION PLEURAL DRAINAGE CATHETER RIGHT CHEST;  Surgeon: Delight Ovens, MD;  Location: [facility] OR;  Service: Thoracic;  Laterality: Right;   EXCISION RIGHT WRIST MASS  2012   EXTRACORPOREAL SHOCK WAVE LITHOTRIPSY Left 06/01/2019   Procedure: EXTRACORPOREAL SHOCK WAVE LITHOTRIPSY (ESWL);  Surgeon: Malen Gauze, MD;  Location: [facility] ORS;  Service: Urology;  Laterality: Left;   KNEE ARTHROSCOPY Right 1999   MASS EXCISION Right 03/11/2014   Procedure: RIGHT WRIST DEEP MASS EXCISION WITH CULTURE AND BIOSPY;  Surgeon: Sharma Covert, MD;  Location: [facility];  Service: Orthopedics;  Laterality: Right;   MICROLARYNGOSCOPY W/VOCAL CORD INJECTION Bilateral 02/08/2021   Procedure: MICROLARYNGOSCOPY WITH VOCAL CORD INJECTION;  Surgeon: Christia Reading, MD;  Location: [facility] OR;  Service: ENT;  Laterality: Bilateral;   REMOVAL OF PLEURAL DRAINAGE CATHETER Right 10/14/2014   Procedure: REMOVAL OF  PLEURAL DRAINAGE CATHETER;  Surgeon: Delight Ovens, MD;  Location: [facility] OR;  Service: Thoracic;  Laterality: Right;   TALC PLEURODESIS Right 09/16/2014   Procedure: TALC PLEURADESIS/ slurry;  Surgeon: Delight Ovens, MD;  Location: [facility] OR;  Service: Thoracic;  Laterality: Right;   THORACOSCOPY  01-26-2009   w/   lung/  node biopsy's   THUMB ARTHROSCOPY Left    - removed bone spur   TONSILLECTOMY  AS CHILD   TOTAL ABDOMINAL HYSTERECTOMY W/ BILATERAL SALPINGOOPHORECTOMY  1982   W/  APPENDECTOMY   TRANSTHORACIC ECHOCARDIOGRAM  12-23-2008   NORMAL LV/  EF 65-70%/  MILD MR  &  TR   VAULT SUSPENSION PLUS CYSTOCELE REPAIR WITH GRAFT  06-13-2010    REVIEW OF SYSTEMS:   Review of Systems  Constitutional:  Positive for fatigue. Negative for appetite change, chills, fever and unexpected weight change.  HENT:   Negative for mouth sores, nosebleeds, sore throat and trouble swallowing.   Eyes: Negative for eye problems and icterus.  Respiratory: Negative for cough, hemoptysis, shortness of breath and wheezing.   Cardiovascular: Negative for chest pain and leg swelling.  Gastrointestinal: Positive for intermittent diarrhea. Negative for abdominal pain, constipation, nausea and vomiting.  Genitourinary: Negative for bladder incontinence, difficulty urinating, dysuria, frequency and hematuria.   Musculoskeletal: Negative for back pain, gait problem, neck pain and neck stiffness.  Skin: Positive for skin lesions and skin abnormalities on right wrist/forearm/hand. Neurological: Negative for dizziness, extremity weakness, gait problem, headaches, light-headedness and seizures.  Hematological: Negative for adenopathy. Does not bruise/bleed easily.  Psychiatric/Behavioral: Negative for confusion, depression and sleep disturbance. The patient is not nervous/anxious.     PHYSICAL EXAMINATION:  Blood pressure 114/80, pulse 97, temperature 97.7 F (36.5 C), temperature source Temporal, resp. rate 17, weight  104 lb 9.6 oz (47.4 kg), SpO2 96 %.  ECOG PERFORMANCE STATUS: 1  Physical Exam  Constitutional: Oriented to person, place, and time and thin appearing [place] and in no distress.   HENT:  Head: Normocephalic and atraumatic.  Mouth/Throat: Oropharynx is clear and moist. No oropharyngeal exudate.  Eyes: Conjunctivae are normal. Right eye exhibits no discharge. Left eye exhibits no discharge. No scleral icterus.  Neck: Normal range of motion. Neck supple.  Cardiovascular: Normal rate, regular rhythm, normal heart sounds and intact distal pulses.   Pulmonary/Chest: Effort normal and breath sounds normal. No respiratory distress. No wheezes. No rales.  Abdominal: Soft. Bowel sounds are normal. Exhibits no distension and no mass. There is no tenderness.  Musculoskeletal: Normal range of motion. Exhibits no edema.  Lymphadenopathy:    No cervical adenopathy.  Neurological: Alert and oriented to person, place, and time. Exhibits muscle wasting. She ambulates with a walker.  Skin: Skin is warm and dry. Skin discoloration on right wrist/forearm. Not diaphoretic. No erythema. No pallor.  Psychiatric: Mood, memory and judgment normal.  Vitals reviewed.  LABORATORY DATA: Lab Results  Component Value Date   WBC 5.7 04/16/2023   HGB 13.8 04/16/2023   HCT 40.1 04/16/2023   MCV 95.5 04/16/2023   PLT 306 04/16/2023      Chemistry      Component Value Date/Time   NA 142 04/16/2023 0956   NA 142 12/10/2017 1000   K 4.2 04/16/2023 0956   K 4.0 12/10/2017 1000   CL 104 04/16/2023 0956   CL 108 (H) 06/03/2013 0939   CO2 31 04/16/2023 0956   CO2 28 12/10/2017 1000   BUN 24 (H) 04/16/2023 0956   BUN 20.4 12/10/2017 1000   CREATININE 1.02 (H) 04/16/2023 0956   CREATININE 0.9 12/10/2017 1000      Component Value Date/Time   CALCIUM 9.4 04/16/2023 0956   CALCIUM 9.7 12/10/2017 1000   ALKPHOS 51 04/16/2023 0956   ALKPHOS 63 12/10/2017 1000   AST 30 04/16/2023 0956   AST 25 12/10/2017 1000    ALT 24 04/16/2023 0956   ALT 17 12/10/2017 1000   BILITOT 1.0 04/16/2023 0956   BILITOT 1.05 12/10/2017 1000       RADIOGRAPHIC STUDIES:  CT Chest W Contrast  Result Date: 04/18/2023 CLINICAL DATA:  Non-small-cell lung cancer. Restaging. * Tracking Code: BO * EXAM: CT CHEST WITH CONTRAST TECHNIQUE: Multidetector CT imaging of the chest was performed during intravenous contrast administration. RADIATION DOSE REDUCTION: This exam was  performed according to the departmental dose-optimization program which includes automated exposure control, adjustment of the mA and/or kV according to patient size and/or use of iterative reconstruction technique. CONTRAST:  75mL OMNIPAQUE IOHEXOL 300 MG/ML  SOLN COMPARISON:  05/26/2023 FINDINGS: Cardiovascular: The heart size is normal. No substantial pericardial effusion. Coronary artery calcification is evident. Moderate atherosclerotic calcification is noted in the wall of the thoracic aorta. Mediastinum/Nodes: No mediastinal lymphadenopathy. There is no hilar lymphadenopathy. Mild circumferential wall thickening noted mid esophagus, similar to prior. There is no axillary lymphadenopathy. Lungs/Pleura: The abnormal soft tissue seen previously in the right retro hilar region has decreased in the interval measuring 2.1 x 1.5 cm today on image 73/2 compared to 3.7 x 3.0 cm previously. Bandlike interstitial and consolidative airspace opacity in the paramediastinal right lung is consistent with radiation fibrosis with stable scarring in the retro hilar right lower lobe extending to the posterior costophrenic sulcus and inferior pleura. High density material along the pleura of the posterior right costophrenic sulcus likely secondary to pleurodesis. Stable volume loss right hemithorax. 13 x 9 mm anterior left upper lobe irregular nodule on 68/7 is not substantially changed in the interval. Elongated 12 x 5 mm nodule in the left lung apex (25/7) is stable. 7 x 6 mm posterior  left lower lobe nodule on 113/7 is slightly more conspicuous on today's study. No evidence for pleural effusion. Upper Abdomen: Visualized portion of the upper abdomen is unremarkable. Musculoskeletal: No worrisome lytic or sclerotic osseous abnormality. IMPRESSION: 1. Interval decrease in size of the abnormal soft tissue seen previously in the right retro hilar region. 2. 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 3. 3 index nodules in the left lung show no substantial interval change. 4. Mild circumferential wall thickening mid esophagus, similar to prior. Esophagitis would be a consideration especially if there has been radiation therapy to this region. 5.  Aortic Atherosclerosis (ICD10-I70.0). Electronically Signed   By: Kennith Center M.D.   On: 04/18/2023 10:49     ASSESSMENT/PLAN:  This is a very pleasant 88 year old Caucasian female with stage IIIb non-small cell lung cancer, adenocarcinoma.  She is positive for an EGFR mutation.  She was diagnosed in January 2010.  She has been on Tarceva for 10 years.  She is currently on dose reduced Tarceva at 100 mg p.o. daily.  The patient recently had a restaging CT scan performed.  The patient was seen with Dr. Arbutus Ped today.  Dr. Arbutus Ped personally and independently reviewed the scan and discussed the results of the patient today.  The scan showed no evidence of disease progression. She has interval decrease in the size of the abnormal soft tissue lesion seen on previous imaging.   Dr. Arbutus Ped recommends that the patient continue on the current treatment at the same dose.  We will see her back for follow-up visit in 3 months for evaluation and a repeat blood work. We arrange repeat imaging every 6 months per Dr. Arbutus Ped.   She will continue taking Remeron 7.5 mg p.o. nightly for her insomnia and  depression.  The patient was advised to call immediately if she has any concerning symptoms in the interval. The patient voices understanding of current disease status and treatment options and is in agreement with the current care plan. All questions were answered. The patient knows to call the clinic with any problems, questions or  concerns. We can certainly see the patient much sooner if necessary   Orders Placed This Encounter  Procedures   CBC with Differential (Cancer Center Only)    Standing Status:   Future    Standing Expiration Date:   04/17/2024   CMP (Cancer Center only)    Standing Status:   Future    Standing Expiration Date:   04/17/2024      Johnette Abraham Arius Harnois, PA-C 04/18/23  ADDENDUM: Hematology/Oncology Attending:  I had a face-to-face encounter with the patient today.  I reviewed her record, lab, scan and recommended her care plan.  This is a very pleasant 88 years old white female with a stage IIIb non-small cell lung cancer, adenocarcinoma with positive EGFR mutation diagnosed in January 2010.  She is status post a course of concurrent chemoradiation followed by treatment with Tarceva 150 mg p.o. daily for 48 months then her dose was reduced to Tarceva 100 mg p.o. daily secondary to diarrhea and the patient is status post 121 months of the reduced dose treatment.  She has been tolerating her treatment well except for occasional skin rash. She had repeat CT scan of the chest performed recently.  I personally and independently reviewed the scan and discussed the result with the patient today. Her scan showed no concerning findings for disease progression. I recommended for her to continue her current treatment with Tarceva 100 mg p.o. daily. For the depression the patient will continue her current treatment with Remeron 7.5 mg p.o. nightly. The patient will come back for follow-up visit in 3 months for evaluation and repeat blood work. She was advised to call  immediately if she has any other concerning symptoms in the interval. The total time spent in the appointment was 30 minutes. Disclaimer: This note was dictated with voice recognition software. Similar sounding words can inadvertently be transcribed and may be missed upon review. Lajuana Matte, MD

## 2023-04-18 ENCOUNTER — Other Ambulatory Visit: Payer: Self-pay

## 2023-04-18 ENCOUNTER — Inpatient Hospital Stay: Payer: Medicare Other | Admitting: Physician Assistant

## 2023-04-18 ENCOUNTER — Ambulatory Visit: Payer: Medicare Other | Admitting: Internal Medicine

## 2023-04-18 VITALS — BP 114/80 | HR 97 | Temp 97.7°F | Resp 17 | Wt 104.6 lb

## 2023-04-18 DIAGNOSIS — C3491 Malignant neoplasm of unspecified part of right bronchus or lung: Secondary | ICD-10-CM | POA: Diagnosis not present

## 2023-04-18 DIAGNOSIS — Z79899 Other long term (current) drug therapy: Secondary | ICD-10-CM | POA: Diagnosis not present

## 2023-04-18 DIAGNOSIS — C3492 Malignant neoplasm of unspecified part of left bronchus or lung: Secondary | ICD-10-CM | POA: Diagnosis not present

## 2023-04-23 ENCOUNTER — Ambulatory Visit: Payer: Medicare Other

## 2023-04-29 ENCOUNTER — Other Ambulatory Visit (HOSPITAL_COMMUNITY): Payer: Self-pay

## 2023-05-03 ENCOUNTER — Other Ambulatory Visit: Payer: Self-pay

## 2023-05-08 ENCOUNTER — Telehealth: Payer: Self-pay | Admitting: Family Medicine

## 2023-05-08 NOTE — Telephone Encounter (Signed)
Patient called stating that she has been having reoccurring knee pain. She asked if she would be able to have another Zilretta injection?  She did say that last time, it did not help as much as she hoped it would but it did allow her to be able to move around more.  Please advise.

## 2023-05-09 NOTE — Telephone Encounter (Signed)
VOB initiated for Zilretta for RIGHT knee OA.  

## 2023-05-09 NOTE — Telephone Encounter (Signed)
Pt received Zilretta inj for RIGHT knee OA 02/08/23. Can consider repeat on or after 05/04/23

## 2023-05-10 NOTE — Telephone Encounter (Signed)
Zilretta for RIGHT knee OA  Primary Insurance: UHC Medicare Adv Co-Pay: $20 Co-Insurance: 20% Deductible: does not apply Prior Auth: NOT required  Last Zilretta inj 02/08/23 Can consider repeat on or after 05/04/23

## 2023-05-10 NOTE — Telephone Encounter (Signed)
No prior authorization, medical notes or referrals needed. Patient has a [employer] HMO-POS plan with an effective date of 12/24/2022. Plan follows Medicare guidelines. Patient responsibility for [phone] Arletta Bale) will be 20% with the remaining covered at 80% by the payer at the contracted rate. Patient is responsible for a $20 copay for CPT code 13086 with the remaining covered at 100% by the payer at contracted rate. Deductibles do not apply to these services. Patient has a $20 copay whether or not an office visit is billed. Only one copay applies per date of service. Patient has an out of pocket maximum of $3600 and has accumulated $974.95. If out of pocket is met, coverage goes to 100% and copays will no longer apply. Patient has a calendar year policy starting from December 24, 2022 to December 24, 2023.

## 2023-05-14 NOTE — Telephone Encounter (Signed)
Scheduled

## 2023-05-15 DIAGNOSIS — C3490 Malignant neoplasm of unspecified part of unspecified bronchus or lung: Secondary | ICD-10-CM | POA: Diagnosis not present

## 2023-05-15 DIAGNOSIS — I700 Atherosclerosis of aorta: Secondary | ICD-10-CM | POA: Diagnosis not present

## 2023-05-15 DIAGNOSIS — Z681 Body mass index (BMI) 19 or less, adult: Secondary | ICD-10-CM | POA: Diagnosis not present

## 2023-05-15 DIAGNOSIS — E782 Mixed hyperlipidemia: Secondary | ICD-10-CM | POA: Diagnosis not present

## 2023-05-15 DIAGNOSIS — J449 Chronic obstructive pulmonary disease, unspecified: Secondary | ICD-10-CM | POA: Diagnosis not present

## 2023-05-15 DIAGNOSIS — M810 Age-related osteoporosis without current pathological fracture: Secondary | ICD-10-CM | POA: Diagnosis not present

## 2023-05-15 DIAGNOSIS — G8929 Other chronic pain: Secondary | ICD-10-CM | POA: Diagnosis not present

## 2023-05-15 DIAGNOSIS — K219 Gastro-esophageal reflux disease without esophagitis: Secondary | ICD-10-CM | POA: Diagnosis not present

## 2023-05-15 DIAGNOSIS — E43 Unspecified severe protein-calorie malnutrition: Secondary | ICD-10-CM | POA: Diagnosis not present

## 2023-05-15 DIAGNOSIS — M05431 Rheumatoid myopathy with rheumatoid arthritis of right wrist: Secondary | ICD-10-CM | POA: Diagnosis not present

## 2023-05-16 ENCOUNTER — Other Ambulatory Visit: Payer: Self-pay

## 2023-05-16 ENCOUNTER — Encounter: Payer: Self-pay | Admitting: Family Medicine

## 2023-05-16 ENCOUNTER — Ambulatory Visit: Payer: Medicare Other | Admitting: Family Medicine

## 2023-05-16 VITALS — BP 126/76 | HR 73 | Ht 61.0 in | Wt 103.2 lb

## 2023-05-16 DIAGNOSIS — M25561 Pain in right knee: Secondary | ICD-10-CM

## 2023-05-16 DIAGNOSIS — G8929 Other chronic pain: Secondary | ICD-10-CM

## 2023-05-16 DIAGNOSIS — M1711 Unilateral primary osteoarthritis, right knee: Secondary | ICD-10-CM | POA: Diagnosis not present

## 2023-05-16 MED ORDER — TRIAMCINOLONE ACETONIDE 32 MG IX SRER
32.0000 mg | Freq: Once | INTRA_ARTICULAR | Status: AC
  Administered 2023-05-16: 32 mg via INTRA_ARTICULAR

## 2023-05-16 NOTE — Progress Notes (Signed)
Rubin Payor, PhD, LAT, ATC acting as a scribe for [name], MD.  Dana [name] is a 88 y.o. female who presents to [facility] at [facility] today for cont'd R knee pain and Zilretta injection. Pt was last seen by Dr. Denyse Amass on 02/08/23 and was given a R knee Zilretta injection and was encouraged to return to exercise and ambulation. Today, pt reports flare up of sx x 1 month. Got about 3 months of relief after last Zilretta injection. Notes intermittent swelling and increased warmth. Rare mechanical sx. Unable to lead with the right leg.   Dx imaging: 12/21/19 R knee XR   Pertinent review of systems: No fevers or chills  Relevant historical information: History of lung cancer   Exam:  BP 126/76   Pulse 73   Ht 5\' 1"  (1.549 m)   Wt 103 lb 3.2 oz (46.8 kg)   SpO2 95%   BMI 19.50 kg/m  General: Well Developed, well nourished, and in no acute distress.   MSK: Right knee mild effusion normal-appearing otherwise normal motion with crepitation.    Lab and Radiology Results  Zilretta injection right knee Procedure: Real-time Ultrasound Guided Injection of right knee joint superior lateral patellar space Device: Philips Affiniti 50G Images permanently stored and available for review in PACS Verbal informed consent obtained.  Discussed risks and benefits of procedure. Warned about infection, bleeding, hyperglycemia damage to structures among others. Patient expresses understanding and agreement Time-out conducted.   Noted no overlying erythema, induration, or other signs of local infection.   Skin prepped in a sterile fashion.   Local anesthesia: Topical Ethyl chloride.   With sterile technique and under real time ultrasound guidance: 32 mg of Zilretta injected into knee joint. Fluid seen entering the joint capsule.   Completed without difficulty   Advised to call if fevers/chills, erythema, induration, drainage, or persistent bleeding.   Images  permanently stored and available for review in the ultrasound unit.  Impression: Technically successful ultrasound guided injection.         Assessment and Plan: 88 y.o. female with chronic right knee pain due to DJD.  She would like very much to avoid knee replacement surgery.  However conservative management options are not working as well as I would like.  Will proceed with repeat Zilretta injection today and anticipate being able to do it again in 3 months.  However since I saw her last there has been a new development for people like her.  Geniculate artery embolization can be helpful and is not offered by the interventional radiology practice.  Will refer to IR to discuss and potentially perform geniculate artery embolization.   PDMP not reviewed this encounter. Orders Placed This Encounter  Procedures   [place] LIMITED JOINT SPACE STRUCTURES LOW RIGHT(NO LINKED CHARGES)    Order Specific Question:   Reason for Exam (SYMPTOM  OR DIAGNOSIS REQUIRED)    Answer:   right knee pain    Order Specific Question:   Preferred imaging location?    Answer:   [facility]    Standing Status:   Future    Standing Expiration Date:   05/15/2024    Order Specific Question:   Reason for Exam (SYMPTOM  OR DIAGNOSIS REQUIRED)    Answer:   Interventional radiology evaluation for geniculate artery embolization    Order Specific Question:   Preferred imaging location?    Answer:   [id number]   Meds ordered this encounter  Medications   Triamcinolone Acetonide (ZILRETTA) intra-articular injection 32 mg     Discussed warning signs or symptoms. Please see discharge instructions. Patient expresses understanding.   The above documentation has been reviewed and is accurate and complete [name], M.D.

## 2023-05-16 NOTE — Patient Instructions (Addendum)
Thank you for coming in today.   You received an injection today. Seek immediate medical attention if the joint becomes red, extremely painful, or is oozing fluid.   We can repeat this injection every 3 months.  I have referred you to interventional radiology for geniculate artery embolization evaluation.  You should hear from them soon.  If they do not contact you their direct phone number is [phone]

## 2023-05-17 NOTE — Telephone Encounter (Signed)
Pt received Zilretta for RIGHT knee OA on 05/16/23 Can consider repeat on or after 08/09/23

## 2023-05-24 ENCOUNTER — Ambulatory Visit
Admission: RE | Admit: 2023-05-24 | Discharge: 2023-05-24 | Disposition: A | Discharge status: Home / Self Care | Payer: Medicare Other | Source: Ambulatory Visit | Attending: Family Medicine | Admitting: Family Medicine

## 2023-05-24 DIAGNOSIS — Z1231 Encounter for screening mammogram for malignant neoplasm of breast: Secondary | ICD-10-CM | POA: Diagnosis not present

## 2023-05-28 ENCOUNTER — Other Ambulatory Visit (HOSPITAL_COMMUNITY): Payer: Self-pay

## 2023-05-28 ENCOUNTER — Ambulatory Visit
Admission: RE | Admit: 2023-05-28 | Discharge: 2023-05-28 | Disposition: A | Discharge status: Home / Self Care | Payer: Medicare Other | Source: Ambulatory Visit | Attending: Family Medicine | Admitting: Family Medicine

## 2023-05-28 DIAGNOSIS — M1711 Unilateral primary osteoarthritis, right knee: Secondary | ICD-10-CM

## 2023-05-28 DIAGNOSIS — G8929 Other chronic pain: Secondary | ICD-10-CM

## 2023-05-28 HISTORY — PX: IR RADIOLOGIST EVAL & MGMT: IMG5224

## 2023-05-28 NOTE — Consult Note (Signed)
Chief Complaint: Patient was seen in consultation today for right knee pain  Referring Physician(s): [name], MD [facility], [facility])  History of Present Illness: Dana [name] is a 88 y.o. female with history of chronic right knee pain related to osteoarthritis.  She has attempted multiple conservative therapies including joint injection with corticosteroid, topical ointments, a knee brace, and tylenol as needed.  She is opposed to a knee arthroplasty due to her age and cancer history, with a fear of being put to sleep.    She requires a rolling walker or cane to ambulate.  She states that she can't "depend" on her right knee without support, that it gives out.  She wears a knee brace.  She has had 2 long-release steroid injections to her right knee.  The first one lasted about 6 months.  The most recent one has not improved her pain at all.    She has a history of lung cancer.  Denies any cerebro-cardio-vascular events.  She has a remote smoking history and history of hyperlipidemia, intolerant to statins.  No rest pain or claudication.  No lower extremity skin loss.  No hypertension.    WOMAC Osteoarthritis Index Score = 48 VAS Pain Score = 4  Past Medical History:  Diagnosis Date   Arthritis    HANDS,  WRISTS   COPD (chronic obstructive pulmonary disease) (HCC)    Depression 04/04/2017   Dyspnea on exertion    Encounter for therapeutic drug monitoring 10/01/2016   GERD (gastroesophageal reflux disease)    at times   Hemorrhoid    History of anal fissures    History of kidney stones    History of lung cancer ONCOLOGIST--  DR Arbutus Ped--  LAST CT ,  NO RECURRENCE OR METS   DX JAN 2010 --  STAGE IIIA  NON-SMALL CELL ADENOCARCINOMA (RIGHT MIDDLE LOBE)---  S/P  CHEMORADIATIO THERAPY  (COMPLETE 03-21-2009)   HTN (hypertension) 10/10/2017   white coat syndrome   Imbalance 06/04/2017   lung ca dx'd 2010   Normal cardiac stress test    2007  PER PT    Osteoporosis    Rash, skin    RIGHT FOREARM/ HAND   Right wrist pain    MASS   Wears glasses     Past Surgical History:  Procedure Laterality Date   ANAL FISSURE REPAIR  07/09/2011   INTERNAL SPHINCTEROTOMY   BENIGN EXCISION LEFT BREAST CENTRAL DUCT  02/1999   BREAST BIOPSY  01/31/2009   benign   BREAST EXCISIONAL BIOPSY Left 2004   benign   CHEST TUBE INSERTION Right 07/14/2014   Procedure: INSERTION PLEURAL DRAINAGE CATHETER RIGHT CHEST;  Surgeon: Delight Ovens, MD;  Location: [facility] OR;  Service: Thoracic;  Laterality: Right;   EXCISION RIGHT WRIST MASS  2012   EXTRACORPOREAL SHOCK WAVE LITHOTRIPSY Left 06/01/2019   Procedure: EXTRACORPOREAL SHOCK WAVE LITHOTRIPSY (ESWL);  Surgeon: Malen Gauze, MD;  Location: [facility]S;  Service: Urology;  Laterality: Left;   KNEE ARTHROSCOPY Right 1999   MASS EXCISION Right 03/11/2014   Procedure: RIGHT WRIST DEEP MASS EXCISION WITH CULTURE AND BIOSPY;  Surgeon: Sharma Covert, MD;  Location[facility];  Service: Orthopedics;  Laterality: Right;   MICROLARYNGOSCOPY W/VOCAL CORD INJECTION Bilateral 02/08/2021   Procedure: MICROLARYNGOSCOPY WITH VOCAL CORD INJECTION;  Surgeon: Christia Reading, MD;  Location: [facility] OR;  Service: ENT;  Laterality: Bilateral;   REMOVAL OF PLEURAL DRAINAGE CATHETER Right 10/14/2014   Procedure:  REMOVAL OF PLEURAL DRAINAGE CATHETER;  Surgeon: Delight Ovens, MD;  Location: [facility] OR;  Service: Thoracic;  Laterality: Right;   TALC PLEURODESIS Right 09/16/2014   Procedure: TALC PLEURADESIS/ slurry;  Surgeon: Delight Ovens, MD;  Location: [facility] OR;  Service: Thoracic;  Laterality: Right;   THORACOSCOPY  01-26-2009   w/   lung/  node biopsy's   THUMB ARTHROSCOPY Left    - removed bone spur   TONSILLECTOMY  AS CHILD   TOTAL ABDOMINAL HYSTERECTOMY W/ BILATERAL SALPINGOOPHORECTOMY  1982   W/  APPENDECTOMY   TRANSTHORACIC ECHOCARDIOGRAM  12-23-2008   NORMAL LV/  EF 65-70%/  MILD MR  &  TR   VAULT SUSPENSION PLUS  CYSTOCELE REPAIR WITH GRAFT  06-13-2010    Allergies: Methotrexate, Clindamycin hcl, Amoxicillin, and Protonix [pantoprazole sodium]  Medications: Prior to Admission medications   Medication Sig Start Date End Date Taking? Authorizing Provider  b complex vitamins capsule Take 1 capsule by mouth daily.    [provider]  bismuth subsalicylate (PEPTO BISMOL) 262 MG/15ML suspension Take 30 mLs by mouth daily as needed for indigestion or diarrhea or loose stools.    [provider]  Calcium-Vitamin D (CVS CALCIUM-600/VIT D PO) Take 1 tablet by mouth 2 (two) times daily.    [provider]  cholecalciferol (VITAMIN D) 1000 UNITS tablet Take 2,000 Units by mouth daily.    [provider]  erlotinib (TARCEVA) 100 MG tablet Take 1 tablet (100 mg total) by mouth daily. 03/07/23   Si Gaul, MD  hydrocortisone 2.5 % cream Apply 1 application. topically 2 (two) times daily. 05/08/22   [provider]  lactase (LACTAID) 3000 units tablet Take 3,000 Units by mouth daily as needed (when eating dairy products).    [provider]  loratadine (CLARITIN) 10 MG tablet Take 10 mg by mouth daily as needed for allergies.    [provider]  meloxicam (MOBIC) 15 MG tablet Take 15 mg by mouth daily. 09/08/20   [provider]  Polyethyl Glycol-Propyl Glycol (SYSTANE OP) Place 1 drop into both eyes at bedtime as needed (for dry eyes.).    [provider]  Probiotic Product (PROBIOTIC ACIDOPHILUS) CHEW Chew 1 tablet by mouth daily.    [provider]  psyllium (METAMUCIL) 58.6 % packet Take 1 packet by mouth daily.    [provider]  Simethicone (GAS-X PO) Take 1 tablet by mouth 2 (two) times daily as needed (for gas).     [provider]  tacrolimus (PROGRAF) 1 MG capsule 1 mg 2 (two) times daily. Dissolve  1 mg capsule in 1/2 liter of water soaking a cotton ball and apply to wound twice daily 08/08/22    [provider]  Tiotropium Bromide-Olodaterol (STIOLTO RESPIMAT) 2.5-2.5 MCG/ACT AERS INHALE 2 PUFFS BY MOUTH ONCE DAILY Patient taking differently: Inhale 2 each into the lungs daily. INHALE 2 PUFFS BY MOUTH ONCE DAILY 07/10/22   Nyoka Cowden, MD  vitamin B-12 (CYANOCOBALAMIN) 1000 MCG tablet Take 1 tablet by mouth daily.    [provider]     Family History  Problem Relation Age of Onset   Cancer Father        bladder   Cancer Son        kidney - removed as a teenager    Social History   Socioeconomic History   Marital status: Married    Spouse name: Not on file   Number of children: Not on  file   Years of education: Not on file   Highest education level: Not on file  Occupational History   Not on file  Tobacco Use   Smoking status: Former    Packs/day: 1.00    Years: 30.00    Additional pack years: 0.00    Total pack years: 30.00    Types: Cigarettes    Quit date: 12/24/1977    Years since quitting: 45.4   Smokeless tobacco: Never  Vaping Use   Vaping Use: Never used  Substance and Sexual Activity   Alcohol use: No   Drug use: No   Sexual activity: Not on file  Other Topics Concern   Not on file  Social History Narrative   Not on file   Social Determinants of Health   Financial Resource Strain: Not on file  Food Insecurity: No Food Insecurity (12/26/2022)   Hunger Vital Sign    Worried About Running Out of Food in the Last Year: Never true    Ran Out of Food in the Last Year: Never true  Transportation Needs: No Transportation Needs (12/26/2022)   PRAPARE - Administrator, Civil Service (Medical): No    Lack of Transportation (Non-Medical): No  Physical Activity: Not on file  Stress: Not on file  Social Connections: Not on file    Review of Systems: A 12 point ROS discussed and pertinent positives are indicated in the HPI above.  All other systems are negative.   Vital Signs: There were no vitals taken for this  visit.  Advance Care Plan: The advanced care plan/surrogate decision maker was discussed at the time of visit and documented in the medical record.   No physical examination was performed in lieu of virtual telephone clinic visit.   Imaging: Right knee (12/21/19)  Kellgren and Lawrence grade 4 (severe)  CT AP 09/21/20  Mild atherosclerotic changes of bilateral CFAs, no pannus.  Labs:  CBC: Recent Labs    12/26/22 0436 12/27/22 0409 01/17/23 1318 04/16/23 0956  WBC 6.9 5.2 4.9 5.7  HGB 11.0* 10.0* 12.2 13.8  HCT 33.4* 30.1* 35.3* 40.1  PLT 252 240 266 306    COAGS: No results for input(s): "INR", "APTT" in the last 8760 hours.  BMP: Recent Labs    12/26/22 0436 12/27/22 0409 01/17/23 1318 04/16/23 0956  NA 137 139 141 142  K 3.5 3.9 3.6 4.2  CL 104 109 105 104  CO2 24 25 32 31  GLUCOSE 118* 132* 86 92  BUN 15 25* 28* 24*  CALCIUM 7.1* 7.4* 9.2 9.4  CREATININE 0.79 0.84 0.91 1.02*  GFRNONAA >60 >60 >60 54*    LIVER FUNCTION TESTS: Recent Labs    12/26/22 0436 12/27/22 0409 01/17/23 1318 04/16/23 0956  BILITOT 1.2 0.5 0.8 1.0  AST 61* 54* 26 30  ALT 45* 50* 22 24  ALKPHOS 40 42 46 51  PROT 4.9* 4.9* 5.9* 6.3*  ALBUMIN 2.5* 2.4* 3.4* 3.8    TUMOR MARKERS: No results for input(s): "AFPTM", "CEA", "CA199", "CHROMGRNA" in the last 8760 hours.  Assessment and Plan: 88 year old female with history of severe (K&L grade 4) right knee osteoarthritis with lifestyle limiting pain and discomfort (WOMAC 48, VAS 4/10), refractory to conservative measures including sustained release steroid injections.  She is not willing to pursue total knee arthroplasty due to her risk factors including lung cancer and age.  She is very interested in pursuing geniculate artery embolization in hopes of minimizing her  pain.    We discussed periprocedural expectations, procedural details, and long term expected outcomes in addition to risks which include access site complication  and transient skin discoloration/non-target embolization.  She is agreeable and wishes to proceed.  Plan for right geniculate artery embolization with moderate sedation at [facility].      Electronically Signed: Bennie Dallas, MD 05/28/2023, 9:34 AM   I spent a total of  40 Minutes  in virtual telephoneclinical consultation, greater than 50% of which was counseling/coordinating care for right knee osteoarthritis.

## 2023-05-29 ENCOUNTER — Other Ambulatory Visit (HOSPITAL_COMMUNITY): Payer: Self-pay | Admitting: Interventional Radiology

## 2023-05-29 DIAGNOSIS — M1711 Unilateral primary osteoarthritis, right knee: Secondary | ICD-10-CM

## 2023-05-29 DIAGNOSIS — M25561 Pain in right knee: Secondary | ICD-10-CM

## 2023-05-31 ENCOUNTER — Other Ambulatory Visit (HOSPITAL_COMMUNITY): Payer: Self-pay

## 2023-06-12 DIAGNOSIS — J383 Other diseases of vocal cords: Secondary | ICD-10-CM | POA: Diagnosis not present

## 2023-06-12 DIAGNOSIS — R1310 Dysphagia, unspecified: Secondary | ICD-10-CM | POA: Diagnosis not present

## 2023-06-12 DIAGNOSIS — R490 Dysphonia: Secondary | ICD-10-CM | POA: Diagnosis not present

## 2023-06-25 ENCOUNTER — Other Ambulatory Visit: Payer: Self-pay | Admitting: Otolaryngology

## 2023-06-26 ENCOUNTER — Other Ambulatory Visit (HOSPITAL_COMMUNITY): Payer: Self-pay

## 2023-06-26 ENCOUNTER — Other Ambulatory Visit: Payer: Self-pay | Admitting: Internal Medicine

## 2023-06-26 DIAGNOSIS — C3490 Malignant neoplasm of unspecified part of unspecified bronchus or lung: Secondary | ICD-10-CM

## 2023-06-26 MED ORDER — ERLOTINIB HCL 100 MG PO TABS
100.0000 mg | ORAL_TABLET | Freq: Every day | ORAL | 3 refills | Status: DC
Start: 2023-06-26 — End: 2023-10-16
  Filled 2023-06-26: qty 30, 30d supply, fill #0
  Filled 2023-07-19: qty 30, 30d supply, fill #1
  Filled 2023-08-21: qty 30, 30d supply, fill #2
  Filled 2023-09-12: qty 30, 30d supply, fill #3

## 2023-06-28 ENCOUNTER — Other Ambulatory Visit: Payer: Self-pay

## 2023-06-28 ENCOUNTER — Other Ambulatory Visit (HOSPITAL_COMMUNITY): Payer: Self-pay

## 2023-07-01 DIAGNOSIS — R350 Frequency of micturition: Secondary | ICD-10-CM | POA: Diagnosis not present

## 2023-07-01 DIAGNOSIS — R339 Retention of urine, unspecified: Secondary | ICD-10-CM | POA: Diagnosis not present

## 2023-07-08 ENCOUNTER — Other Ambulatory Visit: Payer: Self-pay | Admitting: Radiology

## 2023-07-08 DIAGNOSIS — M1711 Unilateral primary osteoarthritis, right knee: Secondary | ICD-10-CM

## 2023-07-08 NOTE — H&P (Signed)
Referring Physician(s): Corey,E  Supervising Physician: Marliss Coots  Patient Status:  [facility]  Chief Complaint: Right knee pain   Subjective: Patient known to IR service from multiple right thoracenteses in 2012, 2013 and 2015 as well as consultation with Dr. Elby Showers on 05/28/2023 to discuss treatment options for chronic right knee pain related to osteoarthritis.  She is an 88 year old female with past medical history of COPD, depression, GERD, nephrolithiasis, lung cancer 2010, hypertension and severe right knee osteoarthritis with lifestyle limiting pain and discomfort.  Her right knee pain is refractory to conservative measures including sustained-release steroid injections.  Patient does not want to pursue total knee arthroplasty due to her risk factors of lung cancer and age.  Following discussion with Dr. Elby Showers patient was deemed an appropriate candidate for right geniculate artery embolization and presents today for the procedure.     Past Medical History:  Diagnosis Date   Arthritis    HANDS,  WRISTS   COPD (chronic obstructive pulmonary disease) (HCC)    Depression 04/04/2017   Dyspnea on exertion    Encounter for therapeutic drug monitoring 10/01/2016   GERD (gastroesophageal reflux disease)    at times   Hemorrhoid    History of anal fissures    History of kidney stones    History of lung cancer ONCOLOGIST--  DR Arbutus Ped--  LAST CT ,  NO RECURRENCE OR METS   DX JAN 2010 --  STAGE IIIA  NON-SMALL CELL ADENOCARCINOMA (RIGHT MIDDLE LOBE)---  S/P  CHEMORADIATIO THERAPY  (COMPLETE 03-21-2009)   HTN (hypertension) 10/10/2017   white coat syndrome   Imbalance 06/04/2017   lung ca dx'd 2010   Normal cardiac stress test    2007  PER PT   Osteoporosis    Rash, skin    RIGHT FOREARM/ HAND   Right wrist pain    MASS   Wears glasses    Past Surgical History:  Procedure Laterality Date   ANAL FISSURE REPAIR  07/09/2011   INTERNAL SPHINCTEROTOMY   BENIGN EXCISION LEFT  BREAST CENTRAL DUCT  02/1999   BREAST BIOPSY  01/31/2009   benign   BREAST EXCISIONAL BIOPSY Left 2004   benign   CHEST TUBE INSERTION Right 07/14/2014   Procedure: INSERTION PLEURAL DRAINAGE CATHETER RIGHT CHEST;  Surgeon: Delight Ovens, MD;  Location: [facility] OR;  Service: Thoracic;  Laterality: Right;   EXCISION RIGHT WRIST MASS  2012   EXTRACORPOREAL SHOCK WAVE LITHOTRIPSY Left 06/01/2019   Procedure: EXTRACORPOREAL SHOCK WAVE LITHOTRIPSY (ESWL);  Surgeon: Malen Gauze, MD;  Location: [facility];  Service: Urology;  Laterality: Left;   IR RADIOLOGIST EVAL & MGMT  05/28/2023   KNEE ARTHROSCOPY Right 1999   MASS EXCISION Right 03/11/2014   Procedure: RIGHT WRIST DEEP MASS EXCISION WITH CULTURE AND BIOSPY;  Surgeon: Sharma Covert, MD;  Location: [facility];  Service: Orthopedics;  Laterality: Right;   MICROLARYNGOSCOPY W/VOCAL CORD INJECTION Bilateral 02/08/2021   Procedure: MICROLARYNGOSCOPY WITH VOCAL CORD INJECTION;  Surgeon: Christia Reading, MD;  Location: [facility] OR;  Service: ENT;  Laterality: Bilateral;   REMOVAL OF PLEURAL DRAINAGE CATHETER Right 10/14/2014   Procedure: REMOVAL OF PLEURAL DRAINAGE CATHETER;  Surgeon: Delight Ovens, MD;  Location: [facility] OR;  Service: Thoracic;  Laterality: Right;   TALC PLEURODESIS Right 09/16/2014   Procedure: TALC PLEURADESIS/ slurry;  Surgeon: Delight Ovens, MD;  Location: [facility] OR;  Service: Thoracic;  Laterality: Right;   THORACOSCOPY  01-26-2009   w/  lung/  node biopsy's   THUMB ARTHROSCOPY Left    - removed bone spur   TONSILLECTOMY  AS CHILD   TOTAL ABDOMINAL HYSTERECTOMY W/ BILATERAL SALPINGOOPHORECTOMY  1982   W/  APPENDECTOMY   TRANSTHORACIC ECHOCARDIOGRAM  12-23-2008   NORMAL LV/  EF 65-70%/  MILD MR  &  TR   VAULT SUSPENSION PLUS CYSTOCELE REPAIR WITH GRAFT  06-13-2010      Allergies: Methotrexate, Clindamycin hcl, Amoxicillin, and Protonix [pantoprazole sodium]  Medications: Prior to Admission medications    Medication Sig Start Date End Date Taking? Authorizing Provider  b complex vitamins capsule Take 1 capsule by mouth daily.    [provider]  bismuth subsalicylate (PEPTO BISMOL) 262 MG/15ML suspension Take 30 mLs by mouth daily as needed for indigestion or diarrhea or loose stools.    [provider]  Calcium-Vitamin D (CVS CALCIUM-600/VIT D PO) Take 1 tablet by mouth 2 (two) times daily.    [provider]  cholecalciferol (VITAMIN D) 1000 UNITS tablet Take 2,000 Units by mouth daily.    [provider]  erlotinib (TARCEVA) 100 MG tablet Take 1 tablet (100 mg total) by mouth daily. 06/26/23   Si Gaul, MD  hydrocortisone 2.5 % cream Apply 1 application. topically 2 (two) times daily. 05/08/22   [provider]  lactase (LACTAID) 3000 units tablet Take 3,000 Units by mouth daily as needed (when eating dairy products).    [provider]  loratadine (CLARITIN) 10 MG tablet Take 10 mg by mouth daily as needed for allergies.    [provider]  meloxicam (MOBIC) 15 MG tablet Take 15 mg by mouth daily. 09/08/20   [provider]  Polyethyl Glycol-Propyl Glycol (SYSTANE OP) Place 1 drop into both eyes at bedtime as needed (for dry eyes.).    [provider]  Probiotic Product (PROBIOTIC ACIDOPHILUS) CHEW Chew 1 tablet by mouth daily.    [provider]  psyllium (METAMUCIL) 58.6 % packet Take 1 packet by mouth daily.    [provider]  Simethicone (GAS-X PO) Take 1 tablet by mouth 2 (two) times daily as needed (for gas).     [provider]  tacrolimus (PROGRAF) 1 MG capsule 1 mg 2 (two) times daily. Dissolve  1 mg capsule in 1/2 liter of water soaking a cotton ball and apply to wound twice daily 08/08/22   [provider]  Tiotropium Bromide-Olodaterol (STIOLTO RESPIMAT) 2.5-2.5 MCG/ACT AERS INHALE 2 PUFFS BY MOUTH ONCE DAILY Patient taking differently: Inhale 2 each into the lungs  daily. INHALE 2 PUFFS BY MOUTH ONCE DAILY 07/10/22   Nyoka Cowden, MD  vitamin B-12 (CYANOCOBALAMIN) 1000 MCG tablet Take 1 tablet by mouth daily.    [provider]     Vital Signs:    Code Status:   Physical Exam  Imaging: No results found.  Labs:  CBC: Recent Labs    12/26/22 0436 12/27/22 0409 01/17/23 1318 04/16/23 0956  WBC 6.9 5.2 4.9 5.7  HGB 11.0* 10.0* 12.2 13.8  HCT 33.4* 30.1* 35.3* 40.1  PLT 252 240 266 306    COAGS: No results for input(s): "INR", "APTT" in the last 8760 hours.  BMP: Recent Labs    12/26/22 0436 12/27/22 0409 01/17/23 1318 04/16/23 0956  NA 137 139 141 142  K 3.5 3.9 3.6 4.2  CL 104 109 105 104  CO2 24 25 32 31  GLUCOSE 118* 132* 86 92  BUN 15 25* 28* 24*  CALCIUM 7.1* 7.4* 9.2 9.4  CREATININE 0.79 0.84 0.91 1.02*  GFRNONAA >60 >60 >60 54*    LIVER FUNCTION TESTS: Recent Labs    12/26/22 0436 12/27/22 0409 01/17/23 1318 04/16/23 0956  BILITOT 1.2 0.5 0.8 1.0  AST 61* 54* 26 30  ALT 45* 50* 22 24  ALKPHOS 40 42 46 51  PROT 4.9* 4.9* 5.9* 6.3*  ALBUMIN 2.5* 2.4* 3.4* 3.8    Assessment and Plan: 88 year old female with past medical history of COPD, depression, GERD, nephrolithiasis, lung cancer 2010, hypertension and severe right knee osteoarthritis with lifestyle limiting pain and discomfort.  Her right knee pain is refractory to conservative measures including sustained-release steroid injections.  Patient does not want to pursue total knee arthroplasty due to her risk factors of lung cancer and age.  Following recent consultation with Dr. Elby Showers on 05/28/23 patient was deemed an appropriate candidate for right geniculate artery embolization and presents today for the procedure.Risks and benefits of procedure were discussed with the patient including, but not limited to bleeding, infection, vascular injury or contrast induced renal failure.  This interventional procedure involves the use of X-rays and because  of the nature of the planned procedure, it is possible that we will have prolonged use of X-ray fluoroscopy.  Potential radiation risks to you include (but are not limited to) the following: - A slightly elevated risk for cancer  several years later in life. This risk is typically less than 0.5% percent. This risk is low in comparison to the normal incidence of human cancer, which is 33% for women and 50% for men according to the [employer] Cancer Society. - Radiation induced injury can include skin redness, resembling a rash, tissue breakdown / ulcers and hair loss (which can be temporary or permanent).   The likelihood of either of these occurring depends on the difficulty of the procedure and whether you are sensitive to radiation due to previous procedures, disease, or genetic conditions.   IF your procedure requires a prolonged use of radiation, you will be notified and given written instructions for further action.  It is your responsibility to monitor the irradiated area for the 2 weeks following the procedure and to notify your physician if you are concerned that you have suffered a radiation induced injury.    All of the patient's questions were answered, patient is agreeable to proceed.  Consent signed and in chart.      Electronically Signed: D. Jeananne Rama, PA-C 07/08/2023, 1:37 PM   I spent a total of 25 minutes  at the the patient's bedside AND on the patient's hospital floor or unit, greater than 50% of which was counseling/coordinating care for right geniculate artery embolization

## 2023-07-09 ENCOUNTER — Encounter (HOSPITAL_COMMUNITY): Payer: Self-pay

## 2023-07-09 ENCOUNTER — Ambulatory Visit (HOSPITAL_COMMUNITY)
Admission: RE | Admit: 2023-07-09 | Discharge: 2023-07-09 | Disposition: A | Discharge status: Home / Self Care | Payer: Medicare Other | Source: Ambulatory Visit | Attending: Interventional Radiology | Admitting: Interventional Radiology

## 2023-07-09 ENCOUNTER — Other Ambulatory Visit (HOSPITAL_COMMUNITY): Payer: Self-pay | Admitting: Interventional Radiology

## 2023-07-09 ENCOUNTER — Other Ambulatory Visit: Payer: Self-pay

## 2023-07-09 DIAGNOSIS — M1711 Unilateral primary osteoarthritis, right knee: Secondary | ICD-10-CM

## 2023-07-09 DIAGNOSIS — F32A Depression, unspecified: Secondary | ICD-10-CM | POA: Diagnosis not present

## 2023-07-09 DIAGNOSIS — J449 Chronic obstructive pulmonary disease, unspecified: Secondary | ICD-10-CM | POA: Diagnosis not present

## 2023-07-09 DIAGNOSIS — M25561 Pain in right knee: Secondary | ICD-10-CM | POA: Insufficient documentation

## 2023-07-09 DIAGNOSIS — K219 Gastro-esophageal reflux disease without esophagitis: Secondary | ICD-10-CM | POA: Insufficient documentation

## 2023-07-09 DIAGNOSIS — Z85118 Personal history of other malignant neoplasm of bronchus and lung: Secondary | ICD-10-CM | POA: Insufficient documentation

## 2023-07-09 DIAGNOSIS — I10 Essential (primary) hypertension: Secondary | ICD-10-CM | POA: Diagnosis not present

## 2023-07-09 HISTORY — PX: IR EMBO TUMOR ORGAN ISCHEMIA INFARCT INC GUIDE ROADMAPPING: IMG5449

## 2023-07-09 HISTORY — PX: IR ANGIOGRAM SELECTIVE EACH ADDITIONAL VESSEL: IMG667

## 2023-07-09 HISTORY — PX: IR EMBO ARTERIAL NOT HEMORR HEMANG INC GUIDE ROADMAPPING: IMG5448

## 2023-07-09 HISTORY — PX: IR US GUIDE VASC ACCESS RIGHT: IMG2390

## 2023-07-09 HISTORY — PX: IR ANGIOGRAM EXTREMITY RIGHT: IMG652

## 2023-07-09 LAB — CBC WITH DIFFERENTIAL/PLATELET
Abs Immature Granulocytes: 0.03 10*3/uL (ref 0.00–0.07)
Basophils Absolute: 0 10*3/uL (ref 0.0–0.1)
Basophils Relative: 0 %
Eosinophils Absolute: 0.2 10*3/uL (ref 0.0–0.5)
Eosinophils Relative: 3 %
HCT: 42.4 % (ref 36.0–46.0)
Hemoglobin: 14 g/dL (ref 12.0–15.0)
Immature Granulocytes: 1 %
Lymphocytes Relative: 13 %
Lymphs Abs: 0.7 10*3/uL (ref 0.7–4.0)
MCH: 31.8 pg (ref 26.0–34.0)
MCHC: 33 g/dL (ref 30.0–36.0)
MCV: 96.4 fL (ref 80.0–100.0)
Monocytes Absolute: 0.5 10*3/uL (ref 0.1–1.0)
Monocytes Relative: 10 %
Neutro Abs: 3.8 10*3/uL (ref 1.7–7.7)
Neutrophils Relative %: 73 %
Platelets: 285 10*3/uL (ref 150–400)
RBC: 4.4 MIL/uL (ref 3.87–5.11)
RDW: 14.9 % (ref 11.5–15.5)
WBC: 5.2 10*3/uL (ref 4.0–10.5)
nRBC: 0 % (ref 0.0–0.2)

## 2023-07-09 LAB — BASIC METABOLIC PANEL
Anion gap: 10 (ref 5–15)
BUN: 18 mg/dL (ref 8–23)
CO2: 26 mmol/L (ref 22–32)
Calcium: 8.4 mg/dL — ABNORMAL LOW (ref 8.9–10.3)
Chloride: 101 mmol/L (ref 98–111)
Creatinine, Ser: 1.01 mg/dL — ABNORMAL HIGH (ref 0.44–1.00)
GFR, Estimated: 54 mL/min — ABNORMAL LOW (ref >60)
Glucose, Bld: 95 mg/dL (ref 70–99)
Potassium: 3.4 mmol/L — ABNORMAL LOW (ref 3.5–5.1)
Sodium: 137 mmol/L (ref 135–145)

## 2023-07-09 LAB — PROTIME-INR
INR: 1.1 (ref 0.8–1.2)
Prothrombin Time: 13.9 seconds (ref 11.4–15.2)

## 2023-07-09 MED ORDER — IODIXANOL 320 MG/ML IV SOLN
50.0000 mL | Freq: Once | INTRAVENOUS | Status: AC | PRN
  Administered 2023-07-09: 20 mL via INTRAVENOUS

## 2023-07-09 MED ORDER — DEXAMETHASONE SODIUM PHOSPHATE 10 MG/ML IJ SOLN
10.0000 mg | Freq: Once | INTRAMUSCULAR | Status: AC
  Administered 2023-07-09: 10 mg via INTRAVENOUS
  Filled 2023-07-09: qty 1

## 2023-07-09 MED ORDER — SODIUM CHLORIDE 0.9 % IV SOLN: INTRAVENOUS | Status: DC

## 2023-07-09 MED ORDER — IODIXANOL 320 MG/ML IV SOLN: 50.0000 mL | Freq: Once | INTRAVENOUS | Status: DC | PRN

## 2023-07-09 MED ORDER — MIDAZOLAM HCL 2 MG/2ML IJ SOLN
INTRAMUSCULAR | Status: AC | PRN
  Administered 2023-07-09 (×2): .5 mg via INTRAVENOUS
  Administered 2023-07-09: 1 mg via INTRAVENOUS
  Administered 2023-07-09: .5 mg via INTRAVENOUS

## 2023-07-09 MED ORDER — LIDOCAINE HCL 1 % IJ SOLN
INTRAMUSCULAR | Status: AC
  Filled 2023-07-09: qty 20

## 2023-07-09 MED ORDER — FENTANYL CITRATE (PF) 100 MCG/2ML IJ SOLN
INTRAMUSCULAR | Status: AC | PRN
  Administered 2023-07-09 (×3): 25 ug via INTRAVENOUS
  Administered 2023-07-09: 50 ug via INTRAVENOUS

## 2023-07-09 MED ORDER — IODIXANOL 320 MG/ML IV SOLN
50.0000 mL | Freq: Once | INTRAVENOUS | Status: AC | PRN
  Administered 2023-07-09: 20 mL via INTRA_ARTERIAL

## 2023-07-09 MED ORDER — ACETAMINOPHEN 10 MG/ML IV SOLN
1000.0000 mg | Freq: Once | INTRAVENOUS | Status: AC
  Administered 2023-07-09: 1000 mg via INTRAVENOUS
  Filled 2023-07-09: qty 100

## 2023-07-09 MED ORDER — MIDAZOLAM HCL 2 MG/2ML IJ SOLN
INTRAMUSCULAR | Status: AC
  Filled 2023-07-09: qty 2

## 2023-07-09 MED ORDER — NITROGLYCERIN IN D5W 100-5 MCG/ML-% IV SOLN
INTRAVENOUS | Status: AC
  Filled 2023-07-09: qty 250

## 2023-07-09 MED ORDER — FENTANYL CITRATE (PF) 100 MCG/2ML IJ SOLN
INTRAMUSCULAR | Status: AC
  Filled 2023-07-09: qty 2

## 2023-07-09 MED ORDER — KETOROLAC TROMETHAMINE 30 MG/ML IJ SOLN
30.0000 mg | INTRAMUSCULAR | Status: AC
  Administered 2023-07-09: 30 mg via INTRAVENOUS
  Filled 2023-07-09: qty 1

## 2023-07-09 MED ORDER — LIDOCAINE HCL (PF) 1 % IJ SOLN
20.0000 mL | Freq: Once | INTRAMUSCULAR | Status: AC
  Administered 2023-07-09: 3 mL via INTRADERMAL

## 2023-07-09 MED ORDER — NITROGLYCERIN 1 MG/10 ML FOR IR/CATH LAB: 100.0000 ug | Freq: Once | INTRA_ARTERIAL | Status: DC

## 2023-07-09 NOTE — Procedures (Signed)
Interventional Radiology Procedure Note  Procedure:  1) Limited right lower extremity angiogram 2) Selective catheterization and angiography of the right superficial femoral and descending geniculate arteries  Findings: Please refer to procedural dictation for full description.  No synovial contribution from descending geniculate artery.  Unsuccessful catheterization of superior medial and inferior medial geniculate arteries due to diminutive size and tortuosity.  R proximal SFA 6 Fr Angioseal closure.  Complications: None immediate  Estimated Blood Loss:  5 mL  Recommendations: 1 hour bedrest. Follow up with IR as needed.   Marliss Coots, MD

## 2023-07-09 NOTE — Sedation Documentation (Signed)
Sheath out

## 2023-07-09 NOTE — Discharge Instructions (Signed)
Please call Interventional Radiology clinic [phone] with any questions or concerns.  You may remove your dressing and shower tomorrow.   Moderate Conscious Sedation, Adult, Care After This sheet gives you information about how to care for yourself after your procedure. Your health care provider may also give you more specific instructions. If you have problems or questions, contact your health care provider. What can I expect after the procedure? After the procedure, it is common to have: Sleepiness for several hours. Impaired judgment for several hours. Difficulty with balance. Vomiting if you eat too soon. Follow these instructions at home: For the time period you were told by your health care provider: Rest. Do not participate in activities where you could fall or become injured. Do not drive or use machinery. Do not drink alcohol. Do not take sleeping pills or medicines that cause drowsiness. Do not make important decisions or sign legal documents. Do not take care of children on your own.        Eating and drinking Follow the diet recommended by your health care provider. Drink enough fluid to keep your urine pale yellow. If you vomit: Drink water, juice, or soup when you can drink without vomiting. Make sure you have little or no nausea before eating solid foods.    General instructions Take over-the-counter and prescription medicines only as told by your health care provider. Have a responsible adult stay with you for the time you are told. It is important to have someone help care for you until you are awake and alert. Do not smoke. Keep all follow-up visits as told by your health care provider. This is important. Contact a health care provider if: You are still sleepy or having trouble with balance after 24 hours. You feel light-headed. You keep feeling nauseous or you keep vomiting. You develop a rash. You have a fever. You have redness or swelling around  the IV site. Get help right away if: You have trouble breathing. You have new-onset confusion at home. Summary After the procedure, it is common to feel sleepy, have impaired judgment, or feel nauseous if you eat too soon. Rest after you get home. Know the things you should not do after the procedure. Follow the diet recommended by your health care provider and drink enough fluid to keep your urine pale yellow. Get help right away if you have trouble breathing or new-onset confusion at home. This information is not intended to replace advice given to you by your health care provider. Make sure you discuss any questions you have with your health care provider. Document Revised: 04/08/2020 Document Reviewed: 11/05/2019 [employer] Patient Education  2021 [employer].

## 2023-07-15 ENCOUNTER — Ambulatory Visit (HOSPITAL_COMMUNITY)
Admission: RE | Admit: 2023-07-15 | Discharge: 2023-07-15 | Disposition: A | Discharge status: Home / Self Care | Payer: Medicare Other | Source: Ambulatory Visit | Attending: Internal Medicine | Admitting: Internal Medicine

## 2023-07-15 ENCOUNTER — Other Ambulatory Visit: Payer: Self-pay

## 2023-07-15 ENCOUNTER — Inpatient Hospital Stay: Payer: Medicare Other | Attending: Internal Medicine

## 2023-07-15 DIAGNOSIS — G4700 Insomnia, unspecified: Secondary | ICD-10-CM | POA: Insufficient documentation

## 2023-07-15 DIAGNOSIS — R21 Rash and other nonspecific skin eruption: Secondary | ICD-10-CM | POA: Insufficient documentation

## 2023-07-15 DIAGNOSIS — C3491 Malignant neoplasm of unspecified part of right bronchus or lung: Secondary | ICD-10-CM

## 2023-07-15 DIAGNOSIS — C3490 Malignant neoplasm of unspecified part of unspecified bronchus or lung: Secondary | ICD-10-CM | POA: Insufficient documentation

## 2023-07-15 DIAGNOSIS — I700 Atherosclerosis of aorta: Secondary | ICD-10-CM | POA: Diagnosis not present

## 2023-07-15 DIAGNOSIS — J90 Pleural effusion, not elsewhere classified: Secondary | ICD-10-CM | POA: Diagnosis not present

## 2023-07-15 DIAGNOSIS — Z85118 Personal history of other malignant neoplasm of bronchus and lung: Secondary | ICD-10-CM | POA: Insufficient documentation

## 2023-07-15 LAB — CMP (CANCER CENTER ONLY)
ALT: 21 U/L (ref 0–44)
AST: 27 U/L (ref 15–41)
Albumin: 3.8 g/dL (ref 3.5–5.0)
Alkaline Phosphatase: 40 U/L (ref 38–126)
Anion gap: 8 (ref 5–15)
BUN: 23 mg/dL (ref 8–23)
CO2: 29 mmol/L (ref 22–32)
Calcium: 9.3 mg/dL (ref 8.9–10.3)
Chloride: 105 mmol/L (ref 98–111)
Creatinine: 0.9 mg/dL (ref 0.44–1.00)
GFR, Estimated: >60 mL/min (ref >60)
Glucose, Bld: 80 mg/dL (ref 70–99)
Potassium: 3.9 mmol/L (ref 3.5–5.1)
Sodium: 142 mmol/L (ref 135–145)
Total Bilirubin: 1.2 mg/dL (ref 0.3–1.2)
Total Protein: 6 g/dL — ABNORMAL LOW (ref 6.5–8.1)

## 2023-07-15 LAB — CBC WITH DIFFERENTIAL (CANCER CENTER ONLY)
Abs Immature Granulocytes: 0.01 10*3/uL (ref 0.00–0.07)
Basophils Absolute: 0 10*3/uL (ref 0.0–0.1)
Basophils Relative: 0 %
Eosinophils Absolute: 0.1 10*3/uL (ref 0.0–0.5)
Eosinophils Relative: 3 %
HCT: 40.2 % (ref 36.0–46.0)
Hemoglobin: 13.9 g/dL (ref 12.0–15.0)
Immature Granulocytes: 0 %
Lymphocytes Relative: 13 %
Lymphs Abs: 0.7 10*3/uL (ref 0.7–4.0)
MCH: 32.3 pg (ref 26.0–34.0)
MCHC: 34.6 g/dL (ref 30.0–36.0)
MCV: 93.5 fL (ref 80.0–100.0)
Monocytes Absolute: 0.5 10*3/uL (ref 0.1–1.0)
Monocytes Relative: 9 %
Neutro Abs: 4.2 10*3/uL (ref 1.7–7.7)
Neutrophils Relative %: 75 %
Platelet Count: 296 10*3/uL (ref 150–400)
RBC: 4.3 MIL/uL (ref 3.87–5.11)
RDW: 14.9 % (ref 11.5–15.5)
WBC Count: 5.5 10*3/uL (ref 4.0–10.5)
nRBC: 0 % (ref 0.0–0.2)

## 2023-07-15 MED ORDER — SODIUM CHLORIDE (PF) 0.9 % IJ SOLN
INTRAMUSCULAR | Status: AC
  Filled 2023-07-15: qty 50

## 2023-07-15 MED ORDER — IOHEXOL 300 MG/ML  SOLN
75.0000 mL | Freq: Once | INTRAMUSCULAR | Status: AC | PRN
  Administered 2023-07-15: 75 mL via INTRAVENOUS

## 2023-07-15 NOTE — Telephone Encounter (Signed)
VOB initiated for Zilretta for RIGHT knee OA.  

## 2023-07-16 NOTE — Progress Notes (Unsigned)
[facility] OFFICE PROGRESS NOTE  Gweneth Dimitri, MD [address] [place] [address]  DIAGNOSIS: Stage IIIB (T2a, N3, M0) non-small cell lung cancer, adenocarcinoma with positive EGFR mutation diagnosed in January 2010.   PRIOR THERAPY: 1) Status post concurrent chemoradiation with weekly carboplatin and paclitaxel; last dose given March 21, 2009.  2) Tarceva at 150 mg p.o. daily, status post approximately 48 months of treatment, discontinued secondary to persistent diarrhea.  3) status post right Pleurx catheter placement for right nonmalignant pleural effusion.  CURRENT THERAPY: Tarceva 100 mg by mouth daily started 03/19/2013, status post 124 months of treatment.   INTERVAL HISTORY: Dana [name] 88 y.o. female returns to the clinic today for a follow-up visit.  The patient was last seen by Dr. Arbutus Ped and myself on 04/18/2023.  The patient is currently on dose reduced Tarceva for her non-small cell lung cancer which she has been on since 2014.  She tolerates it well without any concerning adverse side effects except for occasional manageable diarrhea for which she uses imodium. She does have a sensitive stomach and a lot of foods upset her stomach. She tries to drink a protein supplemental drink daily. She also uses a probiotic. She lost a few pounds since last being seen.  She was previously on 7.5 mg of Remeron due to her insomnia and depression and decreased appetite.  She is no longer on this.  However she would be interested in resuming this at a increased dose to see if it is effective.  She does still struggle with insomnia.  She sees several other specialist for other health concerns.  She recently saw interventional radiology to see if she can have a procedure done to help with her pain in her knee.  Unfortunately, she was not a candidate for this procedure.  She has some limitations with her mobility due to knee pain.  She also sees a dermatologist at [facility] and has an upcoming appointment in August.  She denies any chest pain or hemoptysis.  She denies any significant dyspnea as she does not exert herself.  Mention she may intermittently have a cough due to some irritation in her throat.  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  Denies any nausea, vomiting, or constipation.  She recently had a restaging CT scan.  She is here today for evaluation and to review her scan results.    MEDICAL HISTORY: Past Medical History:  Diagnosis Date   Arthritis    HANDS,  WRISTS   COPD (chronic obstructive pulmonary disease) (HCC)    Depression 04/04/2017   Dyspnea on exertion    Encounter for therapeutic drug monitoring 10/01/2016   GERD (gastroesophageal reflux disease)    at times   Hemorrhoid    History of anal fissures    History of kidney stones    History of lung cancer ONCOLOGIST--  DR Arbutus Ped--  LAST CT ,  NO RECURRENCE OR METS   DX JAN 2010 --  STAGE IIIA  NON-SMALL CELL ADENOCARCINOMA (RIGHT MIDDLE LOBE)---  S/P  CHEMORADIATIO THERAPY  (COMPLETE 03-21-2009)   HTN (hypertension) 10/10/2017   white coat syndrome   Imbalance 06/04/2017   lung ca dx'd 2010   Normal cardiac stress test    2007  PER PT   Osteoporosis    Rash, skin    RIGHT FOREARM/ HAND   Right wrist pain  MASS   Wears glasses     ALLERGIES:  is allergic to methotrexate, clindamycin hcl, amoxicillin, and protonix [pantoprazole sodium].  MEDICATIONS:  Current Outpatient Medications  Medication Sig Dispense Refill   b complex vitamins capsule Take 1 capsule by mouth daily.     bismuth subsalicylate (PEPTO BISMOL) 262 MG/15ML suspension Take 30 mLs by mouth daily as needed for indigestion or diarrhea or loose stools.     Calcium-Vitamin D ([employer] CALCIUM-600/VIT D PO) Take 1 tablet by mouth 2 (two) times daily.     cholecalciferol (VITAMIN D) 1000 UNITS tablet Take 2,000 Units by mouth daily.      erlotinib (TARCEVA) 100 MG tablet Take 1 tablet (100 mg total) by mouth daily. 30 tablet 3   hydrocortisone 2.5 % cream Apply 1 application. topically 2 (two) times daily.     lactase (LACTAID) 3000 units tablet Take 3,000 Units by mouth daily as needed (when eating dairy products).     loratadine (CLARITIN) 10 MG tablet Take 10 mg by mouth daily as needed for allergies.     meloxicam (MOBIC) 15 MG tablet Take 15 mg by mouth daily.     Polyethyl Glycol-Propyl Glycol (SYSTANE OP) Place 1 drop into both eyes at bedtime as needed (for dry eyes.).     Probiotic Product (PROBIOTIC ACIDOPHILUS) CHEW Chew 1 tablet by mouth daily.     psyllium (METAMUCIL) 58.6 % packet Take 1 packet by mouth daily.     Simethicone (GAS-X PO) Take 1 tablet by mouth 2 (two) times daily as needed (for gas).      tacrolimus (PROGRAF) 1 MG capsule 1 mg 2 (two) times daily. Dissolve  1 mg capsule in 1/2 liter of water soaking a cotton ball and apply to wound twice daily     Tiotropium Bromide-Olodaterol (STIOLTO RESPIMAT) 2.5-2.5 MCG/ACT AERS INHALE 2 PUFFS BY MOUTH ONCE DAILY (Patient taking differently: Inhale 2 each into the lungs daily. INHALE 2 PUFFS BY MOUTH ONCE DAILY) 4 g 0   vitamin B-12 (CYANOCOBALAMIN) 1000 MCG tablet Take 1 tablet by mouth daily.     No current facility-administered medications for this visit.    SURGICAL HISTORY:  Past Surgical History:  Procedure Laterality Date   ANAL FISSURE REPAIR  07/09/2011   INTERNAL SPHINCTEROTOMY   BENIGN EXCISION LEFT BREAST CENTRAL DUCT  02/1999   BREAST BIOPSY  01/31/2009   benign   BREAST EXCISIONAL BIOPSY Left 2004   benign   CHEST TUBE INSERTION Right 07/14/2014   Procedure: INSERTION PLEURAL DRAINAGE CATHETER RIGHT CHEST;  Surgeon: Delight Ovens, MD;  Location: [facility] OR;  Service: Thoracic;  Laterality: Right;   EXCISION RIGHT WRIST MASS  2012   EXTRACORPOREAL SHOCK WAVE LITHOTRIPSY Left 06/01/2019   Procedure: EXTRACORPOREAL SHOCK WAVE LITHOTRIPSY (ESWL);   Surgeon: Malen Gauze, MD;  Location: [facility];  Service: Urology;  Laterality: Left;   IR ANGIOGRAM EXTREMITY RIGHT  07/09/2023   IR ANGIOGRAM SELECTIVE EACH ADDITIONAL VESSEL  07/09/2023   IR EMBO TUMOR ORGAN ISCHEMIA INFARCT INC GUIDE ROADMAPPING  07/09/2023   IR RADIOLOGIST EVAL & MGMT  05/28/2023   IR US GUIDE VASC ACCESS RIGHT  07/09/2023   KNEE ARTHROSCOPY Right 1999   MASS EXCISION Right 03/11/2014   Procedure: RIGHT WRIST DEEP MASS EXCISION WITH CULTURE AND BIOSPY;  Surgeon: Sharma Covert, MD;  Location: [facility];  Service: Orthopedics;  Laterality: Right;   MICROLARYNGOSCOPY W/VOCAL CORD INJECTION Bilateral 02/08/2021   Procedure: MICROLARYNGOSCOPY WITH VOCAL  CORD INJECTION;  Surgeon: Christia Reading, MD;  Location: [facility] OR;  Service: ENT;  Laterality: Bilateral;   REMOVAL OF PLEURAL DRAINAGE CATHETER Right 10/14/2014   Procedure: REMOVAL OF PLEURAL DRAINAGE CATHETER;  Surgeon: Delight Ovens, MD;  Location: [facility] OR;  Service: Thoracic;  Laterality: Right;   TALC PLEURODESIS Right 09/16/2014   Procedure: TALC PLEURADESIS/ slurry;  Surgeon: Delight Ovens, MD;  Location: [facility] OR;  Service: Thoracic;  Laterality: Right;   THORACOSCOPY  01-26-2009   w/   lung/  node biopsy's   THUMB ARTHROSCOPY Left    - removed bone spur   TONSILLECTOMY  AS CHILD   TOTAL ABDOMINAL HYSTERECTOMY W/ BILATERAL SALPINGOOPHORECTOMY  1982   W/  APPENDECTOMY   TRANSTHORACIC ECHOCARDIOGRAM  12-23-2008   NORMAL LV/  EF 65-70%/  MILD MR  &  TR   VAULT SUSPENSION PLUS CYSTOCELE REPAIR WITH GRAFT  06-13-2010    REVIEW OF SYSTEMS:   Constitutional: Positive for fatigue and weight loss. Negative for chills and fever. HENT: Positive for raspy voice. negative for mouth sores, nosebleeds, sore throat and trouble swallowing.   Eyes: Negative for eye problems and icterus.  Respiratory: Positive for occasional cough. Negative for hemoptysis, shortness of breath and wheezing.   Cardiovascular: Negative  for chest pain and leg swelling.  Gastrointestinal: Positive for intermittent diarrhea. Negative for abdominal pain, constipation, nausea and vomiting.  Genitourinary: Negative for bladder incontinence, difficulty urinating, dysuria, frequency and hematuria.   Musculoskeletal: Positive for knee pain.  Negative for back pain, gait problem, neck pain and neck stiffness.  Skin: Positive for skin lesions and skin abnormalities on right wrist/forearm/hand. Neurological: Negative for dizziness, extremity weakness, gait problem, headaches, light-headedness and seizures.  Hematological: Negative for adenopathy. Does not bruise/bleed easily.  Psychiatric/Behavioral: Negative for confusion, depression and sleep disturbance. The patient is not nervous/anxious.        PHYSICAL EXAMINATION:  There were no vitals taken for this visit.  ECOG PERFORMANCE STATUS: 1  Physical Exam  Constitutional: Oriented to person, place, and time and thin appearing f[place]ale and in no distress.   HENT:  Head: Normocephalic and atraumatic.  Mouth/Throat: Oropharynx is clear and moist. No oropharyngeal exudate.  Eyes: Conjunctivae are normal. Right eye exhibits no discharge. Left eye exhibits no discharge. No scleral icterus.  Neck: Normal range of motion. Neck supple.  Cardiovascular: Normal rate, regular rhythm, normal heart sounds and intact distal pulses.   Pulmonary/Chest: Effort normal and breath sounds normal. No respiratory distress. No wheezes. No rales.  Abdominal: Soft. Bowel sounds are normal. Exhibits no distension and no mass. There is no tenderness.  Musculoskeletal: Normal range of motion. Exhibits no edema.  Lymphadenopathy:    No cervical adenopathy.  Neurological: Alert and oriented to person, place, and time. Exhibits muscle wasting. She ambulates with a walker.  Skin: Skin is warm and dry. Skin discoloration on right wrist/forearm. Not diaphoretic. No erythema. No pallor.  Psychiatric: Mood, memory  and judgment normal.  Vitals reviewed.  LABORATORY DATA: Lab Results  Component Value Date   WBC 5.5 07/15/2023   HGB 13.9 07/15/2023   HCT 40.2 07/15/2023   MCV 93.5 07/15/2023   PLT 296 07/15/2023      Chemistry      Component Value Date/Time   NA 142 07/15/2023 1025   NA 142 12/10/2017 1000   K 3.9 07/15/2023 1025   K 4.0 12/10/2017 1000   CL 105 07/15/2023 1025   CL 108 (H) 06/03/2013 1610  CO2 29 07/15/2023 1025   CO2 28 12/10/2017 1000   BUN 23 07/15/2023 1025   BUN 20.4 12/10/2017 1000   CREATININE 0.90 07/15/2023 1025   CREATININE 0.9 12/10/2017 1000      Component Value Date/Time   CALCIUM 9.3 07/15/2023 1025   CALCIUM 9.7 12/10/2017 1000   ALKPHOS 40 07/15/2023 1025   ALKPHOS 63 12/10/2017 1000   AST 27 07/15/2023 1025   AST 25 12/10/2017 1000   ALT 21 07/15/2023 1025   ALT 17 12/10/2017 1000   BILITOT 1.2 07/15/2023 1025   BILITOT 1.05 12/10/2017 1000       RADIOGRAPHIC STUDIES:  IR EMBO TUMOR ORGAN ISCHEMIA INFARCT INC GUIDE ROADMAPPING  Result Date: 07/09/2023 INDICATION: 88 year old female with history of severe right knee osteoarthritis and pain refractory to conservative measures. EXAM: 1. Ultrasound-guided vascular access of the right proximal superficial femoral artery 2. Limited right lower extremity angiogram 3. Selective catheterization of the descending genicular artery MEDICATIONS: None. ANESTHESIA/SEDATION: Moderate (conscious) sedation was employed during this procedure. A total of Versed 2.5 mg and Fentanyl 125 mcg was administered intravenously. Moderate Sedation Time: 110 minutes. The patient's level of consciousness and vital signs were monitored continuously by radiology nursing throughout the procedure under my direct supervision. CONTRAST:  20mL VISIPAQUE IODIXANOL 320 MG/ML IV SOLN, 20mL VISIPAQUE IODIXANOL 320 MG/ML IV SOLN, <See Chart> VISIPAQUE IODIXANOL 320 MG/ML IV SOLN, <See Chart> VISIPAQUE IODIXANOL 320 MG/ML IV SOLN  FLUOROSCOPY: Radiation Exposure Index (as provided by the fluoroscopic device): 182 mGy Kerma COMPLICATIONS: SIR Level A - No therapy, no consequence. Perforation/dissection of the superomedial geniculate artery, self-limiting not requiring intervention. PROCEDURE: Informed consent was obtained from the patient following explanation of the procedure, risks, benefits and alternatives. The patient understands, agrees and consents for the procedure. All questions were addressed. A time out was performed prior to the initiation of the procedure. Maximal barrier sterile technique utilized including caps, mask, sterile gowns, sterile gloves, large sterile drape, hand hygiene, and Betadine prep. Preprocedure ultrasound evaluation demonstrated patency of the right common femoral proximal superficial femoral arteries. Procedure was planned. Subdermal Local anesthesia was provided at the planned needle entry site with 1% lidocaine. Under direct ultrasound visualization, the proximal right superficial femoral artery was punctured with a 21 gauge micropuncture needle. A permanent image was captured and stored in the record. A micropuncture sheath was inserted. Limited right lower extremity angiogram was performed which demonstrated patency of the proximal superficial femoral artery. An 0.018" wire was inserted in the micropuncture sheath was exchanged for a 4 5 French slender sheath, through which a 5 [place] Kumpe the catheter and Bentson wire were directed to the distal superficial femoral artery. Other extremity angiogram focused about the knee was performed which demonstrated patency of the distal superficial femoral, popliteal, and proximal three-vessel runoff. The descending genicular, superior medial genicular, and inferior medial genicular arteries were visualized supplying the medial aspect of the knee at the patient's reported site of pain and most progressive osteoarthritic changes. Using a triple angle 1.9 French  lambda microcatheter and fathom 14 wire, the descending genicular artery was catheterized. Distal descending genicular artery angiogram was performed which demonstrated no evidence of significant synovial supply. Therefore the catheter was retracted and redirected to the popliteal artery. Multiple attempts were made at selecting the superomedial genicular artery using a combination of catheters and wires which ultimately were unsuccessful. Upon final attempt at selection with an 018 double angle glide GT there was mild, self-limiting dissection of the vessel.  Attempts the cannulation with an aborted. The catheter was then directed inferiorly and attempts were made at selection of the inferior medial genicular artery which arose from the P2 segment of the popliteal artery. Ultimately this was unsuccessful as well due to diminutive vessel size and tortuosity. The catheters were removed. The sheath was exchanged for a 6 French Angio-Seal device which was deployed without complication. Peripheral pulses were unchanged. The patient tolerated procedure well was transferred to the recovery area in good condition. IMPRESSION: Technically unsuccessful geniculate artery embolization due to diminutive size and tortuosity of the superior medial and inferior medial geniculate arteries. There is no synovial supply of the medial compartment from the descending geniculate artery. Marliss Coots, MD [facility] Electronically Signed   By: Marliss Coots M.D.   On: 07/09/2023 12:03   IR US Guide Vasc Access Right  Result Date: 07/09/2023 INDICATION: 88 year old female with history of severe right knee osteoarthritis and pain refractory to conservative measures. EXAM: 1. Ultrasound-guided vascular access of the right proximal superficial femoral artery 2. Limited right lower extremity angiogram 3. Selective catheterization of the descending genicular artery MEDICATIONS: None.  ANESTHESIA/SEDATION: Moderate (conscious) sedation was employed during this procedure. A total of Versed 2.5 mg and Fentanyl 125 mcg was administered intravenously. Moderate Sedation Time: 110 minutes. The patient's level of consciousness and vital signs were monitored continuously by radiology nursing throughout the procedure under my direct supervision. CONTRAST:  20mL VISIPAQUE IODIXANOL 320 MG/ML IV SOLN, 20mL VISIPAQUE IODIXANOL 320 MG/ML IV SOLN, <See Chart> VISIPAQUE IODIXANOL 320 MG/ML IV SOLN, <See Chart> VISIPAQUE IODIXANOL 320 MG/ML IV SOLN FLUOROSCOPY: Radiation Exposure Index (as provided by the fluoroscopic device): 182 mGy Kerma COMPLICATIONS: SIR Level A - No therapy, no consequence. Perforation/dissection of the superomedial geniculate artery, self-limiting not requiring intervention. PROCEDURE: Informed consent was obtained from the patient following explanation of the procedure, risks, benefits and alternatives. The patient understands, agrees and consents for the procedure. All questions were addressed. A time out was performed prior to the initiation of the procedure. Maximal barrier sterile technique utilized including caps, mask, sterile gowns, sterile gloves, large sterile drape, hand hygiene, and Betadine prep. Preprocedure ultrasound evaluation demonstrated patency of the right common femoral proximal superficial femoral arteries. Procedure was planned. Subdermal Local anesthesia was provided at the planned needle entry site with 1% lidocaine. Under direct ultrasound visualization, the proximal right superficial femoral artery was punctured with a 21 gauge micropuncture needle. A permanent image was captured and stored in the record. A micropuncture sheath was inserted. Limited right lower extremity angiogram was performed which demonstrated patency of the proximal superficial femoral artery. An 0.018" wire was inserted in the micropuncture sheath was exchanged for a 4 5 French slender  sheath, through which a 5 [place] Kumpe the catheter and Bentson wire were directed to the distal superficial femoral artery. Other extremity angiogram focused about the knee was performed which demonstrated patency of the distal superficial femoral, popliteal, and proximal three-vessel runoff. The descending genicular, superior medial genicular, and inferior medial genicular arteries were visualized supplying the medial aspect of the knee at the patient's reported site of pain and most progressive osteoarthritic changes. Using a triple angle 1.9 French lambda microcatheter and fathom 14 wire, the descending genicular artery was catheterized. Distal descending genicular artery angiogram was performed which demonstrated no evidence of significant synovial supply. Therefore the catheter was retracted and redirected to the popliteal artery. Multiple attempts were made at selecting the superomedial genicular artery  using a combination of catheters and wires which ultimately were unsuccessful. Upon final attempt at selection with an 018 double angle glide GT there was mild, self-limiting dissection of the vessel. Attempts the cannulation with an aborted. The catheter was then directed inferiorly and attempts were made at selection of the inferior medial genicular artery which arose from the P2 segment of the popliteal artery. Ultimately this was unsuccessful as well due to diminutive vessel size and tortuosity. The catheters were removed. The sheath was exchanged for a 6 French Angio-Seal device which was deployed without complication. Peripheral pulses were unchanged. The patient tolerated procedure well was transferred to the recovery area in good condition. IMPRESSION: Technically unsuccessful geniculate artery embolization due to diminutive size and tortuosity of the superior medial and inferior medial geniculate arteries. There is no synovial supply of the medial compartment from the descending geniculate artery.  Marliss Coots, MD [facility] Electronically Signed   By: Marliss Coots M.D.   On: 07/09/2023 12:03   IR Angiogram Extremity Right  Result Date: 07/09/2023 INDICATION: 88 year old female with history of severe right knee osteoarthritis and pain refractory to conservative measures. EXAM: 1. Ultrasound-guided vascular access of the right proximal superficial femoral artery 2. Limited right lower extremity angiogram 3. Selective catheterization of the descending genicular artery MEDICATIONS: None. ANESTHESIA/SEDATION: Moderate (conscious) sedation was employed during this procedure. A total of Versed 2.5 mg and Fentanyl 125 mcg was administered intravenously. Moderate Sedation Time: 110 minutes. The patient's level of consciousness and vital signs were monitored continuously by radiology nursing throughout the procedure under my direct supervision. CONTRAST:  20mL VISIPAQUE IODIXANOL 320 MG/ML IV SOLN, 20mL VISIPAQUE IODIXANOL 320 MG/ML IV SOLN, <See Chart> VISIPAQUE IODIXANOL 320 MG/ML IV SOLN, <See Chart> VISIPAQUE IODIXANOL 320 MG/ML IV SOLN FLUOROSCOPY: Radiation Exposure Index (as provided by the fluoroscopic device): 182 mGy Kerma COMPLICATIONS: SIR Level A - No therapy, no consequence. Perforation/dissection of the superomedial geniculate artery, self-limiting not requiring intervention. PROCEDURE: Informed consent was obtained from the patient following explanation of the procedure, risks, benefits and alternatives. The patient understands, agrees and consents for the procedure. All questions were addressed. A time out was performed prior to the initiation of the procedure. Maximal barrier sterile technique utilized including caps, mask, sterile gowns, sterile gloves, large sterile drape, hand hygiene, and Betadine prep. Preprocedure ultrasound evaluation demonstrated patency of the right common femoral proximal superficial femoral arteries.  Procedure was planned. Subdermal Local anesthesia was provided at the planned needle entry site with 1% lidocaine. Under direct ultrasound visualization, the proximal right superficial femoral artery was punctured with a 21 gauge micropuncture needle. A permanent image was captured and stored in the record. A micropuncture sheath was inserted. Limited right lower extremity angiogram was performed which demonstrated patency of the proximal superficial femoral artery. An 0.018" wire was inserted in the micropuncture sheath was exchanged for a 4 5 French slender sheath, through which a 5 [place] Kumpe the catheter and Bentson wire were directed to the distal superficial femoral artery. Other extremity angiogram focused about the knee was performed which demonstrated patency of the distal superficial femoral, popliteal, and proximal three-vessel runoff. The descending genicular, superior medial genicular, and inferior medial genicular arteries were visualized supplying the medial aspect of the knee at the patient's reported site of pain and most progressive osteoarthritic changes. Using a triple angle 1.9 French lambda microcatheter and fathom 14 wire, the descending genicular artery was catheterized. Distal descending genicular artery angiogram was performed  which demonstrated no evidence of significant synovial supply. Therefore the catheter was retracted and redirected to the popliteal artery. Multiple attempts were made at selecting the superomedial genicular artery using a combination of catheters and wires which ultimately were unsuccessful. Upon final attempt at selection with an 018 double angle glide GT there was mild, self-limiting dissection of the vessel. Attempts the cannulation with an aborted. The catheter was then directed inferiorly and attempts were made at selection of the inferior medial genicular artery which arose from the P2 segment of the popliteal artery. Ultimately this was unsuccessful as well  due to diminutive vessel size and tortuosity. The catheters were removed. The sheath was exchanged for a 6 French Angio-Seal device which was deployed without complication. Peripheral pulses were unchanged. The patient tolerated procedure well was transferred to the recovery area in good condition. IMPRESSION: Technically unsuccessful geniculate artery embolization due to diminutive size and tortuosity of the superior medial and inferior medial geniculate arteries. There is no synovial supply of the medial compartment from the descending geniculate artery. Marliss Coots, MD [facility] [place] [facility] Electronically Signed   By: Marliss Coots M.D.   On: 07/09/2023 12:03   IR Angiogram Selective Each Additional Vessel  Result Date: 07/09/2023 INDICATION: 88 year old female with history of severe right knee osteoarthritis and pain refractory to conservative measures. EXAM: 1. Ultrasound-guided vascular access of the right proximal superficial femoral artery 2. Limited right lower extremity angiogram 3. Selective catheterization of the descending genicular artery MEDICATIONS: None. ANESTHESIA/SEDATION: Moderate (conscious) sedation was employed during this procedure. A total of Versed 2.5 mg and Fentanyl 125 mcg was administered intravenously. Moderate Sedation Time: 110 minutes. The patient's level of consciousness and vital signs were monitored continuously by radiology nursing throughout the procedure under my direct supervision. CONTRAST:  20mL VISIPAQUE IODIXANOL 320 MG/ML IV SOLN, 20mL VISIPAQUE IODIXANOL 320 MG/ML IV SOLN, <See Chart> VISIPAQUE IODIXANOL 320 MG/ML IV SOLN, <See Chart> VISIPAQUE IODIXANOL 320 MG/ML IV SOLN FLUOROSCOPY: Radiation Exposure Index (as provided by the fluoroscopic device): 182 mGy Kerma COMPLICATIONS: SIR Level A - No therapy, no consequence. Perforation/dissection of the superomedial geniculate artery, self-limiting not requiring  intervention. PROCEDURE: Informed consent was obtained from the patient following explanation of the procedure, risks, benefits and alternatives. The patient understands, agrees and consents for the procedure. All questions were addressed. A time out was performed prior to the initiation of the procedure. Maximal barrier sterile technique utilized including caps, mask, sterile gowns, sterile gloves, large sterile drape, hand hygiene, and Betadine prep. Preprocedure ultrasound evaluation demonstrated patency of the right common femoral proximal superficial femoral arteries. Procedure was planned. Subdermal Local anesthesia was provided at the planned needle entry site with 1% lidocaine. Under direct ultrasound visualization, the proximal right superficial femoral artery was punctured with a 21 gauge micropuncture needle. A permanent image was captured and stored in the record. A micropuncture sheath was inserted. Limited right lower extremity angiogram was performed which demonstrated patency of the proximal superficial femoral artery. An 0.018" wire was inserted in the micropuncture sheath was exchanged for a 4 5 French slender sheath, through which a 5 [place] Kumpe the catheter and Bentson wire were directed to the distal superficial femoral artery. Other extremity angiogram focused about the knee was performed which demonstrated patency of the distal superficial femoral, popliteal, and proximal three-vessel runoff. The descending genicular, superior medial genicular, and inferior medial genicular arteries were visualized supplying the medial aspect of the knee at the patient's reported site of  pain and most progressive osteoarthritic changes. Using a triple angle 1.9 French lambda microcatheter and fathom 14 wire, the descending genicular artery was catheterized. Distal descending genicular artery angiogram was performed which demonstrated no evidence of significant synovial supply. Therefore the catheter was  retracted and redirected to the popliteal artery. Multiple attempts were made at selecting the superomedial genicular artery using a combination of catheters and wires which ultimately were unsuccessful. Upon final attempt at selection with an 018 double angle glide GT there was mild, self-limiting dissection of the vessel. Attempts the cannulation with an aborted. The catheter was then directed inferiorly and attempts were made at selection of the inferior medial genicular artery which arose from the P2 segment of the popliteal artery. Ultimately this was unsuccessful as well due to diminutive vessel size and tortuosity. The catheters were removed. The sheath was exchanged for a 6 French Angio-Seal device which was deployed without complication. Peripheral pulses were unchanged. The patient tolerated procedure well was transferred to the recovery area in good condition. IMPRESSION: Technically unsuccessful geniculate artery embolization due to diminutive size and tortuosity of the superior medial and inferior medial geniculate arteries. There is no synovial supply of the medial compartment from the descending geniculate artery. Marliss Coots, MD [facility] Radiology Electronically Signed   By: Marliss Coots M.D.   On: 07/09/2023 12:03     ASSESSMENT/PLAN:  This is a very pleasant 89 year old Caucasian female with stage IIIb non-small cell lung cancer, adenocarcinoma.  She is positive for an EGFR mutation.  She was diagnosed in January 2010.   She has been on Tarceva for over 10 years.  She is currently on dose reduced Tarceva at 100 mg p.o. daily.  he patient recently had a restaging CT scan performed.  The patient was seen with Dr. Arbutus Ped today.  Dr. Arbutus Ped personally and independently reviewed the scan and discussed the results of the patient today.  The scan showed no evidence of disease progression.  Dr. Arbutus Ped recommends that the patient continue on  the current treatment at the same dose.   We will see her back for a follow-up visit in 3 months for evaluation and a repeat blood work. We arrange repeat imaging every 6 months per Dr. Arbutus Ped.   We will resume her Remeron at 15 mg p.o. nightly.  This will hopefully help her insomnia and help with her weight.  She will continue using Imodium if needed for diarrhea.  The patient was advised to call immediately if she has any concerning symptoms in the interval. The patient voices understanding of current disease status and treatment options and is in agreement with the current care plan. All questions were answered. The patient knows to call the clinic with any problems, questions or concerns. We can certainly see the patient much sooner if necessary  No orders of the defined types were placed in this encounter.   Muzammil Bruins L Jeptha Hinnenkamp, PA-C 07/16/23  ADDENDUM: Hematology/Oncology Attending: I had a face-to-face encounter with the patient today.  I reviewed her records, lab, scan and recommended her care plan.  This is a very pleasant 88 years old white female with stage IIIb non-small cell lung cancer, adenocarcinoma with positive EGFR mutation diagnosed in January 2010 and has been on treatment with Tarceva for for almost 14 years now and has been tolerating her treatment fairly well. She continues to have mild skin rash and fatigue as well as arthralgia. She had repeat CT scan of the chest  performed recently.  I personally and independently reviewed the scan and discussed the results with the patient today. Her scan showed no concerning findings for disease recurrence or metastasis. I recommended for her to continue her current treatment with Tarceva with the same dose. We will see her back for follow-up visit in 3 months for evaluation and repeat blood work. She was advised to call immediately if she has any other concerning symptoms in the interval. The total time spent in the  appointment was 20 minutes. Disclaimer: This note was dictated with voice recognition software. Similar sounding words can inadvertently be transcribed and may be missed upon review.  Lajuana Matte, MD

## 2023-07-16 NOTE — Telephone Encounter (Signed)
Zilretta for RIGHT knee OA Can consider repeat on or after 08/09/23   Primary Insurance: [facility] Medicare Co-pay: $20 Co-insurance: 20% Deductible: does not apply Prior Auth NOT required   RIGHT knee injection history: 12/15/19 - Kenalog 08/02/20 - Kenalog 09/05/21 - Kenalog 11/28/21 - Durolane 05/30/22 - Durolane 09/05/22 - Kenalog 09/20/22 - Zilretta 02/08/23 - Zilretta 05/16/23 - Zilretta

## 2023-07-16 NOTE — Telephone Encounter (Signed)
Holding until 08/09/23

## 2023-07-17 DIAGNOSIS — M810 Age-related osteoporosis without current pathological fracture: Secondary | ICD-10-CM | POA: Diagnosis not present

## 2023-07-18 ENCOUNTER — Inpatient Hospital Stay: Payer: Medicare Other | Admitting: Physician Assistant

## 2023-07-18 ENCOUNTER — Other Ambulatory Visit: Payer: Self-pay

## 2023-07-18 VITALS — BP 150/88 | HR 102 | Temp 97.8°F | Resp 19 | Ht 64.0 in | Wt 100.4 lb

## 2023-07-18 DIAGNOSIS — C3491 Malignant neoplasm of unspecified part of right bronchus or lung: Secondary | ICD-10-CM | POA: Diagnosis not present

## 2023-07-18 DIAGNOSIS — R21 Rash and other nonspecific skin eruption: Secondary | ICD-10-CM | POA: Diagnosis not present

## 2023-07-18 DIAGNOSIS — R634 Abnormal weight loss: Secondary | ICD-10-CM | POA: Diagnosis not present

## 2023-07-18 DIAGNOSIS — C3490 Malignant neoplasm of unspecified part of unspecified bronchus or lung: Secondary | ICD-10-CM | POA: Diagnosis not present

## 2023-07-18 DIAGNOSIS — G4700 Insomnia, unspecified: Secondary | ICD-10-CM | POA: Diagnosis not present

## 2023-07-18 DIAGNOSIS — Z85118 Personal history of other malignant neoplasm of bronchus and lung: Secondary | ICD-10-CM | POA: Diagnosis not present

## 2023-07-18 MED ORDER — MIRTAZAPINE 15 MG PO TABS
15.0000 mg | ORAL_TABLET | Freq: Every day | ORAL | 2 refills | Status: DC
Start: 2023-07-18 — End: 2024-01-16

## 2023-07-19 ENCOUNTER — Other Ambulatory Visit (HOSPITAL_COMMUNITY): Payer: Self-pay

## 2023-07-26 ENCOUNTER — Other Ambulatory Visit: Payer: Self-pay

## 2023-07-31 ENCOUNTER — Other Ambulatory Visit: Payer: Self-pay

## 2023-07-31 ENCOUNTER — Encounter (HOSPITAL_COMMUNITY): Payer: Self-pay | Admitting: Otolaryngology

## 2023-08-02 NOTE — Progress Notes (Signed)
Pt and family made aware of surgery time change [phone], arrival 1130, a covid test will be obtained upon arrival, and to follow all other previous instructions.

## 2023-08-05 ENCOUNTER — Other Ambulatory Visit: Payer: Self-pay

## 2023-08-05 ENCOUNTER — Ambulatory Visit (HOSPITAL_BASED_OUTPATIENT_CLINIC_OR_DEPARTMENT_OTHER): Payer: Medicare Other | Admitting: Registered Nurse

## 2023-08-05 ENCOUNTER — Ambulatory Visit (HOSPITAL_COMMUNITY): Payer: Medicare Other | Admitting: Registered Nurse

## 2023-08-05 ENCOUNTER — Encounter (HOSPITAL_COMMUNITY)
Admission: RE | Disposition: A | Discharge status: Home / Self Care | Payer: Self-pay | Source: Home / Self Care | Attending: Otolaryngology

## 2023-08-05 ENCOUNTER — Encounter (HOSPITAL_COMMUNITY): Payer: Self-pay | Admitting: Otolaryngology

## 2023-08-05 ENCOUNTER — Ambulatory Visit (HOSPITAL_COMMUNITY)
Admission: RE | Admit: 2023-08-05 | Discharge: 2023-08-05 | Disposition: A | Discharge status: Home / Self Care | Payer: Medicare Other | Attending: Otolaryngology | Admitting: Otolaryngology

## 2023-08-05 DIAGNOSIS — J387 Other diseases of larynx: Secondary | ICD-10-CM | POA: Insufficient documentation

## 2023-08-05 DIAGNOSIS — R490 Dysphonia: Secondary | ICD-10-CM | POA: Diagnosis not present

## 2023-08-05 DIAGNOSIS — J449 Chronic obstructive pulmonary disease, unspecified: Secondary | ICD-10-CM | POA: Insufficient documentation

## 2023-08-05 DIAGNOSIS — I10 Essential (primary) hypertension: Secondary | ICD-10-CM | POA: Diagnosis not present

## 2023-08-05 DIAGNOSIS — Z85118 Personal history of other malignant neoplasm of bronchus and lung: Secondary | ICD-10-CM | POA: Insufficient documentation

## 2023-08-05 DIAGNOSIS — Z87891 Personal history of nicotine dependence: Secondary | ICD-10-CM | POA: Insufficient documentation

## 2023-08-05 DIAGNOSIS — K219 Gastro-esophageal reflux disease without esophagitis: Secondary | ICD-10-CM | POA: Insufficient documentation

## 2023-08-05 DIAGNOSIS — Z1152 Encounter for screening for COVID-19: Secondary | ICD-10-CM | POA: Insufficient documentation

## 2023-08-05 DIAGNOSIS — J383 Other diseases of vocal cords: Secondary | ICD-10-CM

## 2023-08-05 HISTORY — DX: Anemia, unspecified: D64.9

## 2023-08-05 HISTORY — PX: DIRECT LARYNGOSCOPY WITH RADIAESSE INJECTION: SHX5328

## 2023-08-05 HISTORY — DX: Dyspnea, unspecified: R06.00

## 2023-08-05 LAB — SARS CORONAVIRUS 2 BY RT PCR: SARS Coronavirus 2 by RT PCR: NEGATIVE

## 2023-08-05 SURGERY — LARYNGOSCOPY, DIRECT, WITH CALCIUM HYDROXYLAPATITE MICROSPHERE INJECTION
Anesthesia: General | Site: Throat | Laterality: Bilateral

## 2023-08-05 MED ORDER — ACETAMINOPHEN 10 MG/ML IV SOLN: 1000.0000 mg | Freq: Once | INTRAVENOUS | Status: DC | PRN

## 2023-08-05 MED ORDER — OXYMETAZOLINE HCL 0.05 % NA SOLN
NASAL | Status: AC
  Filled 2023-08-05: qty 30

## 2023-08-05 MED ORDER — FENTANYL CITRATE (PF) 100 MCG/2ML IJ SOLN: 25.0000 ug | INTRAMUSCULAR | Status: DC | PRN

## 2023-08-05 MED ORDER — ONDANSETRON HCL 4 MG/2ML IJ SOLN
INTRAMUSCULAR | Status: DC | PRN
  Administered 2023-08-05: 4 mg via INTRAVENOUS

## 2023-08-05 MED ORDER — DEXAMETHASONE SODIUM PHOSPHATE 10 MG/ML IJ SOLN
INTRAMUSCULAR | Status: DC | PRN
  Administered 2023-08-05: 6 mg via INTRAVENOUS

## 2023-08-05 MED ORDER — ACETAMINOPHEN 160 MG/5ML PO SOLN: 325.0000 mg | Freq: Once | ORAL | Status: DC | PRN

## 2023-08-05 MED ORDER — VANCOMYCIN HCL IN DEXTROSE 1-5 GM/200ML-% IV SOLN
1000.0000 mg | INTRAVENOUS | Status: AC
  Administered 2023-08-05: 1000 mg via INTRAVENOUS
  Filled 2023-08-05: qty 200

## 2023-08-05 MED ORDER — PHENYLEPHRINE 80 MCG/ML (10ML) SYRINGE FOR IV PUSH (FOR BLOOD PRESSURE SUPPORT)
PREFILLED_SYRINGE | INTRAVENOUS | Status: DC | PRN
  Administered 2023-08-05: 80 ug via INTRAVENOUS

## 2023-08-05 MED ORDER — DEXAMETHASONE SODIUM PHOSPHATE 10 MG/ML IJ SOLN
INTRAMUSCULAR | Status: AC
  Filled 2023-08-05: qty 1

## 2023-08-05 MED ORDER — LIDOCAINE HCL (CARDIAC) PF 100 MG/5ML IV SOSY
PREFILLED_SYRINGE | INTRAVENOUS | Status: DC | PRN
  Administered 2023-08-05: 30 mg via INTRATRACHEAL

## 2023-08-05 MED ORDER — ACETAMINOPHEN 325 MG PO TABS: 325.0000 mg | ORAL_TABLET | Freq: Once | ORAL | Status: DC | PRN

## 2023-08-05 MED ORDER — PROPOFOL 10 MG/ML IV BOLUS
INTRAVENOUS | Status: DC | PRN
Start: 2023-08-05 — End: 2023-08-05
  Administered 2023-08-05: 100 mg via INTRAVENOUS
  Administered 2023-08-05: 20 mg via INTRAVENOUS

## 2023-08-05 MED ORDER — ONDANSETRON HCL 4 MG/2ML IJ SOLN
INTRAMUSCULAR | Status: AC
  Filled 2023-08-05: qty 2

## 2023-08-05 MED ORDER — LACTATED RINGERS IV SOLN: INTRAVENOUS | Status: DC

## 2023-08-05 MED ORDER — ROCURONIUM BROMIDE 100 MG/10ML IV SOLN
INTRAVENOUS | Status: DC | PRN
  Administered 2023-08-05: 30 mg via INTRAVENOUS

## 2023-08-05 MED ORDER — CHLORHEXIDINE GLUCONATE 0.12 % MT SOLN
OROMUCOSAL | Status: AC
  Administered 2023-08-05: 15 mL via OROMUCOSAL
  Filled 2023-08-05: qty 15

## 2023-08-05 MED ORDER — PROPOFOL 10 MG/ML IV BOLUS
INTRAVENOUS | Status: AC
  Filled 2023-08-05: qty 20

## 2023-08-05 MED ORDER — VANCOMYCIN HCL 1500 MG/300ML IV SOLN
INTRAVENOUS | Status: AC
  Filled 2023-08-05: qty 300

## 2023-08-05 MED ORDER — FENTANYL CITRATE (PF) 250 MCG/5ML IJ SOLN
INTRAMUSCULAR | Status: DC | PRN
  Administered 2023-08-05 (×2): 50 ug via INTRAVENOUS

## 2023-08-05 MED ORDER — EPINEPHRINE HCL (NASAL) 0.1 % NA SOLN
NASAL | Status: AC
  Filled 2023-08-05: qty 90

## 2023-08-05 MED ORDER — ORAL CARE MOUTH RINSE: 15.0000 mL | Freq: Once | OROMUCOSAL | Status: AC

## 2023-08-05 MED ORDER — PROPOFOL 500 MG/50ML IV EMUL
INTRAVENOUS | Status: DC | PRN
  Administered 2023-08-05: 150 ug/kg/min via INTRAVENOUS

## 2023-08-05 MED ORDER — DEXAMETHASONE SODIUM PHOSPHATE 4 MG/ML IJ SOLN: INTRAMUSCULAR | Status: DC | PRN

## 2023-08-05 MED ORDER — SUGAMMADEX SODIUM 200 MG/2ML IV SOLN
INTRAVENOUS | Status: DC | PRN
  Administered 2023-08-05: 200 mg via INTRAVENOUS

## 2023-08-05 MED ORDER — EPINEPHRINE HCL (NASAL) 0.1 % NA SOLN: NASAL | Status: DC | PRN

## 2023-08-05 MED ORDER — FENTANYL CITRATE (PF) 250 MCG/5ML IJ SOLN
INTRAMUSCULAR | Status: AC
  Filled 2023-08-05: qty 5

## 2023-08-05 MED ORDER — CHLORHEXIDINE GLUCONATE 0.12 % MT SOLN: 15.0000 mL | Freq: Once | OROMUCOSAL | Status: AC

## 2023-08-05 SURGICAL SUPPLY — 32 items
BAG COUNTER SPONGE SURGICOUNT (BAG) ×2 IMPLANT
BAG SPNG CNTER NS LX DISP (BAG)
BALLN PULM 15 16.5 18X75 (BALLOONS)
BALLOON PULM 15 16.5 18X75 (BALLOONS) IMPLANT
CANISTER SUCT 3000ML PPV (MISCELLANEOUS) ×2 IMPLANT
CNTNR URN SCR LID CUP LEK RST (MISCELLANEOUS) IMPLANT
CONT SPEC 4OZ STRL OR WHT (MISCELLANEOUS)
COVER BACK TABLE 60X90IN (DRAPES) ×2 IMPLANT
COVER MAYO STAND STRL (DRAPES) ×2 IMPLANT
DRAPE HALF SHEET 40X57 (DRAPES) ×2 IMPLANT
GAUZE 4X4 16PLY ~~LOC~~+RFID DBL (SPONGE) ×2 IMPLANT
GAUZE SPONGE 4X4 12PLY STRL (GAUZE/BANDAGES/DRESSINGS) IMPLANT
GLOVE BIO SURGEON STRL SZ7.5 (GLOVE) ×2 IMPLANT
GOWN STRL REUS W/ TWL LRG LVL3 (GOWN DISPOSABLE) IMPLANT
GOWN STRL REUS W/TWL LRG LVL3 (GOWN DISPOSABLE)
GUARD TEETH (MISCELLANEOUS) ×2 IMPLANT
KIT PROLARN PLUS GEL W/NDL (Prosthesis and Implant ENT) ×2 IMPLANT
KIT TURNOVER KIT B (KITS) ×2 IMPLANT
MARKER SKIN DUAL TIP RULER LAB (MISCELLANEOUS) IMPLANT
NDL HYPO 25GX1X1/2 BEV (NEEDLE) IMPLANT
NDL TRANS ORAL INJECTION (NEEDLE) ×2 IMPLANT
NEEDLE HYPO 25GX1X1/2 BEV (NEEDLE) IMPLANT
NEEDLE TRANS ORAL INJECTION (NEEDLE) ×1 IMPLANT
NS IRRIG 1000ML POUR BTL (IV SOLUTION) ×2 IMPLANT
PAD ARMBOARD 7.5X6 YLW CONV (MISCELLANEOUS) ×4 IMPLANT
PATTIES SURGICAL .5 X3 (DISPOSABLE) IMPLANT
POSITIONER HEAD DONUT 9IN (MISCELLANEOUS) IMPLANT
SOL ANTI FOG 6CC (MISCELLANEOUS) IMPLANT
SURGILUBE 2OZ TUBE FLIPTOP (MISCELLANEOUS) IMPLANT
TOWEL GREEN STERILE FF (TOWEL DISPOSABLE) ×4 IMPLANT
TUBE CONNECTING 12X1/4 (SUCTIONS) ×2 IMPLANT
WATER STERILE IRR 1000ML POUR (IV SOLUTION) IMPLANT

## 2023-08-05 NOTE — Brief Op Note (Signed)
08/05/2023  3:26 PM  PATIENT:  Dana [name]  88 y.o. female  PRE-OPERATIVE DIAGNOSIS:  Vocal cord atrophy Dysphonia  POST-OPERATIVE DIAGNOSIS:  Vocal cord atrophy Dysphonia  PROCEDURE:  Procedure(s): SUSPENDED MIRCO DIRECT LARYNGOSCOPY WITH PROLARYN INJECTION (Bilateral)  SURGEON:  Surgeons and Role:    Christia Reading, MD - Primary  PHYSICIAN ASSISTANT:   ASSISTANTS: none   ANESTHESIA:   general  EBL:  None   BLOOD ADMINISTERED:none  DRAINS: none   LOCAL MEDICATIONS USED:  NONE  SPECIMEN:  No Specimen  DISPOSITION OF SPECIMEN:  N/A  COUNTS:  YES  TOURNIQUET:  * No tourniquets in log *  DICTATION: .Note written in EPIC  PLAN OF CARE: Discharge to home after PACU  PATIENT DISPOSITION:  PACU - hemodynamically stable.   Delay start of Pharmacological VTE agent (>24hrs) due to surgical blood loss or risk of bleeding: no

## 2023-08-05 NOTE — Transfer of Care (Signed)
Immediate Anesthesia Transfer of Care Note  Patient: Dana [name]  Procedure(s) Performed: SUSPENDED MIRCO DIRECT LARYNGOSCOPY WITH PROLARYN INJECTION (Bilateral: Throat)  Patient Location: PACU  Anesthesia Type:General  Level of Consciousness: drowsy  Airway & Oxygen Therapy: Patient connected to face mask  Post-op Assessment: Report given to RN and Post -op Vital signs reviewed and stable  Post vital signs: Reviewed and stable  Last Vitals:  Vitals Value Taken Time  BP 153/85 08/05/23 1545  Temp 36.8 C 08/05/23 1545  Pulse 88 08/05/23 1556  Resp 10 08/05/23 1556  SpO2 92 % 08/05/23 1556  Vitals shown include unfiled device data.  Last Pain:  Vitals:   08/05/23 1545  PainSc: 0-No pain         Complications: No notable events documented.

## 2023-08-05 NOTE — Op Note (Signed)
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   COMPLICATIONS:  None.   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  She previously underwent injection augmentation with two years of benefit.  She presents back to the operating room for surgical management due to return of symptoms.   FINDINGS:  No lesions.  Vocal folds appear atrophic.   DESCRIPTION OF PROCEDURE:  The patient was identified in the holding room, informed consent having been obtained including discussion of risks, benefits and alternatives, the patient was brought to the operative suite and put the operative table in the supine position.  Anesthesia was induced and the patient was maintained via mask ventilation.  The eyes were taped closed and bed was turned 90 degrees from anesthesia.  The patient was given intravenous steroids during the case.  A tooth guard was placed over the upper teeth and a Stortz laryngoscope was placed into the supraglottic position and suspended to Mayo stand using the Lewy arm.  Jet ventilation was initiated.  Photodocumentation was performed with the zero degree telescope.  The operating microscope was brought into the field and was used to evaluate the larynx.  Each vocal fold was then injected with Prolaryn plus totaling about 0.25 cc per side with about 2/3 injected more posteriorly and about 1/3 more anteriorly.  Photodocumentation was repeated.  The laryngoscope was then taking out of suspension and removed from the patient's mouth while suctioning the airway.  The tooth guard was removed and the patient was turned back to anesthesia for wakeup and taken to the recovery room in  stable condition.

## 2023-08-05 NOTE — H&P (Signed)
Dana [name] is an 88 y.o. female.   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  She underwent previous injection augmentation with two years of benefit.  She returns to the OR due to return of hoarseness.  Past Medical History:  Diagnosis Date   Anemia    Arthritis    HANDS,  WRISTS   COPD (chronic obstructive pulmonary disease) (HCC)    Depression 04/04/2017   Dyspnea    Dyspnea on exertion    Encounter for therapeutic drug monitoring 10/01/2016   GERD (gastroesophageal reflux disease)    at times   Hemorrhoid    History of anal fissures    History of kidney stones    History of lung cancer ONCOLOGIST--  DR Arbutus Ped--  LAST CT ,  NO RECURRENCE OR METS   DX JAN 2010 --  STAGE IIIA  NON-SMALL CELL ADENOCARCINOMA (RIGHT MIDDLE LOBE)---  S/P  CHEMORADIATIO THERAPY  (COMPLETE 03-21-2009)   HTN (hypertension) 10/10/2017   white coat syndrome   Imbalance 06/04/2017   lung ca dx'd 2010   Normal cardiac stress test    2007  PER PT   Osteoporosis    Rash, skin    RIGHT FOREARM/ HAND   Right wrist pain    MASS   Wears glasses     Past Surgical History:  Procedure Laterality Date   ANAL FISSURE REPAIR  07/09/2011   INTERNAL SPHINCTEROTOMY   BENIGN EXCISION LEFT BREAST CENTRAL DUCT  02/1999   BREAST BIOPSY  01/31/2009   benign   BREAST EXCISIONAL BIOPSY Left 2004   benign   CHEST TUBE INSERTION Right 07/14/2014   Procedure: INSERTION PLEURAL DRAINAGE CATHETER RIGHT CHEST;  Surgeon: Delight Ovens, MD;  Location: [facility] OR;  Service: Thoracic;  Laterality: Right;   EXCISION RIGHT WRIST MASS  2012   EXTRACORPOREAL SHOCK WAVE LITHOTRIPSY Left 06/01/2019   Procedure: EXTRACORPOREAL SHOCK WAVE LITHOTRIPSY (ESWL);  Surgeon: Malen Gauze, MD;  Location: [facility] ORS;  Service: Urology;  Laterality: Left;   IR ANGIOGRAM EXTREMITY RIGHT  07/09/2023   IR ANGIOGRAM SELECTIVE EACH ADDITIONAL VESSEL  07/09/2023   IR EMBO TUMOR ORGAN ISCHEMIA  INFARCT INC GUIDE ROADMAPPING  07/09/2023   IR RADIOLOGIST EVAL & MGMT  05/28/2023   IR US GUIDE VASC ACCESS RIGHT  07/09/2023   KNEE ARTHROSCOPY Right 1999   MASS EXCISION Right 03/11/2014   Procedure: RIGHT WRIST DEEP MASS EXCISION WITH CULTURE AND BIOSPY;  Surgeon: Sharma Covert, MD;  Location: [facility];  Service: Orthopedics;  Laterality: Right;   MICROLARYNGOSCOPY W/VOCAL CORD INJECTION Bilateral 02/08/2021   Procedure: MICROLARYNGOSCOPY WITH VOCAL CORD INJECTION;  Surgeon: Christia Reading, MD;  Location: [facility] OR;  Service: ENT;  Laterality: Bilateral;   REMOVAL OF PLEURAL DRAINAGE CATHETER Right 10/14/2014   Procedure: REMOVAL OF PLEURAL DRAINAGE CATHETER;  Surgeon: Delight Ovens, MD;  Location[facility] OR;  Service: Thoracic;  Laterality: Right;   TALC PLEURODESIS Right 09/16/2014   Procedure: TALC PLEURADESIS/ slurry;  Surgeon: Delight Ovens, MD;  Location: [facility] OR;  Service: Thoracic;  Laterality: Right;   THORACOSCOPY  01-26-2009   w/   lung/  node biopsy's   THUMB ARTHROSCOPY Left    - removed bone spur   TONSILLECTOMY  AS CHILD   TOTAL ABDOMINAL HYSTERECTOMY W/ BILATERAL SALPINGOOPHORECTOMY  1982   W/  APPENDECTOMY   TRANSTHORACIC ECHOCARDIOGRAM  12-23-2008   NORMAL LV/  EF 65-70%/  MILD  MR  &  TR   VAULT SUSPENSION PLUS CYSTOCELE REPAIR WITH GRAFT  06-13-2010    Family History  Problem Relation Age of Onset   Cancer Father        bladder   Cancer Son        kidney - removed as a teenager   Social History:  reports that she quit smoking about 45 years ago. Her smoking use included cigarettes. She started smoking about 75 years ago. She has a 30 pack-year smoking history. She has never used smokeless tobacco. She reports that she does not drink alcohol and does not use drugs.  Allergies:  Allergies  Allergen Reactions   Methotrexate Shortness Of Breath   Clindamycin Hcl Diarrhea and Other (See Comments)   Amoxicillin Diarrhea    Hx of C Diff    Protonix  [Pantoprazole Sodium] Rash    Medications Prior to Admission  Medication Sig Dispense Refill   b complex vitamins capsule Take 1 capsule by mouth daily.     Biotin 5000 MCG TABS Take 5,000 mcg by mouth daily.     bismuth subsalicylate (PEPTO BISMOL) 262 MG/15ML suspension Take 30 mLs by mouth daily as needed for indigestion or diarrhea or loose stools.     CALCIUM CITRATE PO Take 600 mg by mouth 2 (two) times daily.     cholecalciferol (VITAMIN D) 1000 UNITS tablet Take 2,000 Units by mouth daily.     erlotinib (TARCEVA) 100 MG tablet Take 1 tablet (100 mg total) by mouth daily. 30 tablet 3   hydrocortisone 2.5 % cream Apply 1 application  topically 2 (two) times a week.     mirtazapine (REMERON) 15 MG tablet Take 1 tablet (15 mg total) by mouth at bedtime. (Patient taking differently: Take 7.5 mg by mouth at bedtime.) 30 tablet 2   Multiple Vitamins-Minerals (PRESERVISION AREDS 2 PO) Take 1 tablet by mouth 2 (two) times daily.     Polyethyl Glycol-Propyl Glycol (SYSTANE OP) Place 1 drop into both eyes at bedtime.     Probiotic Product (PROBIOTIC ACIDOPHILUS) CHEW Chew 1 tablet by mouth daily.     psyllium (METAMUCIL) 58.6 % packet Take 1 packet by mouth every morning.     tacrolimus (PROGRAF) 1 MG capsule 1 mg See admin instructions. Dissolve  1 mg capsule in 1/2 liter of water soaking a cotton ball and apply to wound daily     Tiotropium Bromide-Olodaterol (STIOLTO RESPIMAT) 2.5-2.5 MCG/ACT AERS INHALE 2 PUFFS BY MOUTH ONCE DAILY (Patient taking differently: Inhale 2 puffs into the lungs daily.) 4 g 0   vitamin B-12 (CYANOCOBALAMIN) 1000 MCG tablet Take 1,000 mcg by mouth daily.     lactase (LACTAID) 3000 units tablet Take 3,000 Units by mouth daily as needed (when eating dairy products).     loratadine (CLARITIN) 10 MG tablet Take 10 mg by mouth daily as needed for allergies.     Simethicone (GAS-X PO) Take 1 tablet by mouth 2 (two) times daily as needed (for gas).       Results for  orders placed or performed during the hospital encounter of 08/05/23 (from the past 48 hour(s))  SARS Coronavirus 2 by RT PCR (hospital order, performed in [facility] hospital lab) *cepheid single result test* Anterior Nasal Swab     Status: None   Collection Time: 08/05/23 11:46 AM   Specimen: Anterior Nasal Swab  Result Value Ref Range   SARS Coronavirus 2 by RT PCR NEGATIVE NEGATIVE  Comment: Performed at [facility], [address], [place], [place] [address]   No results found.  Review of Systems  All other systems reviewed and are negative.   Blood pressure (!) 138/96, pulse 100, temperature (!) 97.5 F (36.4 C), resp. rate 18, height 5\' 4"  (1.626 m), weight 44.5 kg, SpO2 97%. Physical Exam Constitutional:      Appearance: Normal appearance. She is normal weight.  HENT:     Head: Normocephalic and atraumatic.     Right Ear: External ear normal.     Left Ear: External ear normal.     Nose: Nose normal.     Mouth/Throat:     Mouth: Mucous membranes are moist.     Pharynx: Oropharynx is clear.  Eyes:     Extraocular Movements: Extraocular movements intact.     Conjunctiva/sclera: Conjunctivae normal.     Pupils: Pupils are equal, round, and reactive to light.  Cardiovascular:     Rate and Rhythm: Normal rate.  Pulmonary:     Effort: Pulmonary effort is normal.  Musculoskeletal:     Cervical back: Normal range of motion.  Skin:    General: Skin is warm and dry.  Neurological:     General: No focal deficit present.     Mental Status: She is alert and oriented to person, place, and time.  Psychiatric:        Mood and Affect: Mood normal.        Behavior: Behavior normal.        Thought Content: Thought content normal.        Judgment: Judgment normal.      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  Christia Reading, MD 08/05/2023, 2:11 PM

## 2023-08-05 NOTE — Anesthesia Postprocedure Evaluation (Signed)
Anesthesia Post Note  Patient: KORINNE DIIORIO  Procedure(s) Performed: SUSPENDED MIRCO DIRECT LARYNGOSCOPY WITH PROLARYN INJECTION (Bilateral: Throat)     Patient location during evaluation: PACU Anesthesia Type: General Level of consciousness: awake and alert Pain management: pain level controlled Vital Signs Assessment: post-procedure vital signs reviewed and stable Respiratory status: spontaneous breathing, nonlabored ventilation, respiratory function stable and patient connected to nasal cannula oxygen Cardiovascular status: blood pressure returned to baseline and stable Postop Assessment: no apparent nausea or vomiting Anesthetic complications: no  No notable events documented.  Last Vitals:  Vitals:   08/05/23 1630 08/05/23 1645  BP: (!) 143/81 (!) 152/81  Pulse: 89 86  Resp: 13 17  Temp:  36.9 C  SpO2: 100% 94%    Last Pain:  Vitals:   08/05/23 1645  PainSc: 0-No pain                 Shelton Silvas

## 2023-08-05 NOTE — Anesthesia Preprocedure Evaluation (Addendum)
Anesthesia Evaluation  Patient identified by MRN, date of birth, ID band Patient awake    Reviewed: Allergy & Precautions, NPO status , Patient's Chart, lab work & pertinent test results  Airway Mallampati: I  TM Distance: >3 FB Neck ROM: Full    Dental  (+) Teeth Intact, Dental Advisory Given   Pulmonary COPD, former smoker   breath sounds clear to auscultation       Cardiovascular hypertension, + DOE   Rhythm:Regular Rate:Normal     Neuro/Psych  PSYCHIATRIC DISORDERS  Depression    negative neurological ROS     GI/Hepatic Neg liver ROS,GERD  ,,  Endo/Other    Renal/GU      Musculoskeletal  (+) Arthritis ,    Abdominal   Peds  Hematology   Anesthesia Other Findings   Reproductive/Obstetrics                             Anesthesia Physical Anesthesia Plan  ASA: 3  Anesthesia Plan: General   Post-op Pain Management:    Induction: Intravenous  PONV Risk Score and Plan: 4 or greater and TIVA, Ondansetron and Treatment may vary due to age or medical condition  Airway Management Planned: Natural Airway  Additional Equipment: None  Intra-op Plan:   Post-operative Plan: Extubation in OR  Informed Consent: I have reviewed the patients History and Physical, chart, labs and discussed the procedure including the risks, benefits and alternatives for the proposed anesthesia with the patient or authorized representative who has indicated his/her understanding and acceptance.     Dental advisory given  Plan Discussed with: CRNA  Anesthesia Plan Comments: (- Jet ventilation)       Anesthesia Quick Evaluation

## 2023-08-06 ENCOUNTER — Encounter (HOSPITAL_COMMUNITY): Payer: Self-pay | Admitting: Otolaryngology

## 2023-08-12 ENCOUNTER — Telehealth: Payer: Self-pay | Admitting: Family Medicine

## 2023-08-12 DIAGNOSIS — L309 Dermatitis, unspecified: Secondary | ICD-10-CM | POA: Diagnosis not present

## 2023-08-12 DIAGNOSIS — D485 Neoplasm of uncertain behavior of skin: Secondary | ICD-10-CM | POA: Diagnosis not present

## 2023-08-12 DIAGNOSIS — L942 Calcinosis cutis: Secondary | ICD-10-CM | POA: Diagnosis not present

## 2023-08-12 DIAGNOSIS — L929 Granulomatous disorder of the skin and subcutaneous tissue, unspecified: Secondary | ICD-10-CM | POA: Diagnosis not present

## 2023-08-12 NOTE — Telephone Encounter (Signed)
Patient called stating that she has still been having continued knee pain even with past injections. She would like to know if there was a brace that we could provide or recommend for her to use that might help?  Please advise. She asked that we call her daughter in law because her phone is not able to take in [place] calls.  Daughter in law Darel Hong: [phone]

## 2023-08-13 NOTE — Telephone Encounter (Signed)
Spoke to [place] again to let her know to be expecting a call from [facility] at [facility]. She verbalized understanding and appreciation.

## 2023-08-13 NOTE — Telephone Encounter (Signed)
Spoke w/ Darel Hong and discussed options. Pt has tried OTC knee sleeve, which started to break down her skin and she was having difficulty w/ her hands/grip strength and being able to pull it on. Reached out to our [facility] for options.

## 2023-08-15 DIAGNOSIS — C44622 Squamous cell carcinoma of skin of right upper limb, including shoulder: Secondary | ICD-10-CM | POA: Diagnosis not present

## 2023-08-15 DIAGNOSIS — L989 Disorder of the skin and subcutaneous tissue, unspecified: Secondary | ICD-10-CM | POA: Diagnosis not present

## 2023-08-19 ENCOUNTER — Other Ambulatory Visit: Payer: Self-pay

## 2023-08-20 DIAGNOSIS — R040 Epistaxis: Secondary | ICD-10-CM | POA: Insufficient documentation

## 2023-08-20 DIAGNOSIS — R490 Dysphonia: Secondary | ICD-10-CM | POA: Diagnosis not present

## 2023-08-21 ENCOUNTER — Other Ambulatory Visit: Payer: Self-pay

## 2023-08-21 ENCOUNTER — Other Ambulatory Visit (HOSPITAL_COMMUNITY): Payer: Self-pay

## 2023-08-24 ENCOUNTER — Encounter (HOSPITAL_BASED_OUTPATIENT_CLINIC_OR_DEPARTMENT_OTHER): Payer: Self-pay

## 2023-08-24 ENCOUNTER — Other Ambulatory Visit: Payer: Self-pay

## 2023-08-24 ENCOUNTER — Emergency Department (HOSPITAL_BASED_OUTPATIENT_CLINIC_OR_DEPARTMENT_OTHER)
Admission: EM | Admit: 2023-08-24 | Discharge: 2023-08-24 | Disposition: A | Discharge status: Home / Self Care | Payer: Medicare Other | Source: Home / Self Care | Attending: Emergency Medicine | Admitting: Emergency Medicine

## 2023-08-24 ENCOUNTER — Emergency Department (HOSPITAL_BASED_OUTPATIENT_CLINIC_OR_DEPARTMENT_OTHER): Payer: Medicare Other

## 2023-08-24 DIAGNOSIS — I10 Essential (primary) hypertension: Secondary | ICD-10-CM | POA: Diagnosis not present

## 2023-08-24 DIAGNOSIS — S0990XA Unspecified injury of head, initial encounter: Secondary | ICD-10-CM | POA: Insufficient documentation

## 2023-08-24 DIAGNOSIS — Z23 Encounter for immunization: Secondary | ICD-10-CM | POA: Insufficient documentation

## 2023-08-24 DIAGNOSIS — J449 Chronic obstructive pulmonary disease, unspecified: Secondary | ICD-10-CM | POA: Insufficient documentation

## 2023-08-24 DIAGNOSIS — T796XXA Traumatic ischemia of muscle, initial encounter: Secondary | ICD-10-CM | POA: Diagnosis not present

## 2023-08-24 DIAGNOSIS — R5381 Other malaise: Secondary | ICD-10-CM

## 2023-08-24 DIAGNOSIS — I700 Atherosclerosis of aorta: Secondary | ICD-10-CM | POA: Diagnosis not present

## 2023-08-24 DIAGNOSIS — Z85118 Personal history of other malignant neoplasm of bronchus and lung: Secondary | ICD-10-CM | POA: Diagnosis not present

## 2023-08-24 DIAGNOSIS — R000 Tachycardia, unspecified: Secondary | ICD-10-CM | POA: Diagnosis not present

## 2023-08-24 DIAGNOSIS — S51011A Laceration without foreign body of right elbow, initial encounter: Secondary | ICD-10-CM | POA: Diagnosis not present

## 2023-08-24 DIAGNOSIS — W01198A Fall on same level from slipping, tripping and stumbling with subsequent striking against other object, initial encounter: Secondary | ICD-10-CM | POA: Diagnosis not present

## 2023-08-24 DIAGNOSIS — M47812 Spondylosis without myelopathy or radiculopathy, cervical region: Secondary | ICD-10-CM | POA: Diagnosis not present

## 2023-08-24 DIAGNOSIS — R9082 White matter disease, unspecified: Secondary | ICD-10-CM | POA: Diagnosis not present

## 2023-08-24 DIAGNOSIS — Z7951 Long term (current) use of inhaled steroids: Secondary | ICD-10-CM | POA: Insufficient documentation

## 2023-08-24 DIAGNOSIS — K8020 Calculus of gallbladder without cholecystitis without obstruction: Secondary | ICD-10-CM | POA: Diagnosis not present

## 2023-08-24 DIAGNOSIS — S59901A Unspecified injury of right elbow, initial encounter: Secondary | ICD-10-CM | POA: Diagnosis present

## 2023-08-24 DIAGNOSIS — Z87891 Personal history of nicotine dependence: Secondary | ICD-10-CM | POA: Diagnosis not present

## 2023-08-24 DIAGNOSIS — K8080 Other cholelithiasis without obstruction: Secondary | ICD-10-CM | POA: Diagnosis not present

## 2023-08-24 DIAGNOSIS — R109 Unspecified abdominal pain: Secondary | ICD-10-CM | POA: Diagnosis not present

## 2023-08-24 DIAGNOSIS — W19XXXA Unspecified fall, initial encounter: Secondary | ICD-10-CM

## 2023-08-24 DIAGNOSIS — S199XXA Unspecified injury of neck, initial encounter: Secondary | ICD-10-CM | POA: Diagnosis not present

## 2023-08-24 DIAGNOSIS — R918 Other nonspecific abnormal finding of lung field: Secondary | ICD-10-CM | POA: Diagnosis not present

## 2023-08-24 DIAGNOSIS — R531 Weakness: Secondary | ICD-10-CM | POA: Diagnosis not present

## 2023-08-24 DIAGNOSIS — M4802 Spinal stenosis, cervical region: Secondary | ICD-10-CM | POA: Diagnosis not present

## 2023-08-24 DIAGNOSIS — R079 Chest pain, unspecified: Secondary | ICD-10-CM | POA: Diagnosis not present

## 2023-08-24 LAB — URINALYSIS, ROUTINE W REFLEX MICROSCOPIC
Bilirubin Urine: NEGATIVE
Glucose, UA: NEGATIVE mg/dL
Ketones, ur: NEGATIVE mg/dL
Leukocytes,Ua: NEGATIVE
Nitrite: NEGATIVE
Protein, ur: NEGATIVE mg/dL
Specific Gravity, Urine: 1.015 (ref 1.005–1.030)
pH: 6 (ref 5.0–8.0)

## 2023-08-24 LAB — HEPATIC FUNCTION PANEL
ALT: 32 U/L (ref 0–44)
AST: 46 U/L — ABNORMAL HIGH (ref 15–41)
Albumin: 3.2 g/dL — ABNORMAL LOW (ref 3.5–5.0)
Alkaline Phosphatase: 43 U/L (ref 38–126)
Bilirubin, Direct: 0.3 mg/dL — ABNORMAL HIGH (ref 0.0–0.2)
Indirect Bilirubin: 1.7 mg/dL — ABNORMAL HIGH (ref 0.3–0.9)
Total Bilirubin: 2 mg/dL — ABNORMAL HIGH (ref 0.3–1.2)
Total Protein: 6 g/dL — ABNORMAL LOW (ref 6.5–8.1)

## 2023-08-24 LAB — BASIC METABOLIC PANEL
Anion gap: 10 (ref 5–15)
BUN: 26 mg/dL — ABNORMAL HIGH (ref 8–23)
CO2: 23 mmol/L (ref 22–32)
Calcium: 8.5 mg/dL — ABNORMAL LOW (ref 8.9–10.3)
Chloride: 105 mmol/L (ref 98–111)
Creatinine, Ser: 0.92 mg/dL (ref 0.44–1.00)
GFR, Estimated: >60 mL/min (ref >60)
Glucose, Bld: 138 mg/dL — ABNORMAL HIGH (ref 70–99)
Potassium: 3.3 mmol/L — ABNORMAL LOW (ref 3.5–5.1)
Sodium: 138 mmol/L (ref 135–145)

## 2023-08-24 LAB — URINALYSIS, MICROSCOPIC (REFLEX)

## 2023-08-24 LAB — CBC
HCT: 35.4 % — ABNORMAL LOW (ref 36.0–46.0)
Hemoglobin: 12.1 g/dL (ref 12.0–15.0)
MCH: 32.8 pg (ref 26.0–34.0)
MCHC: 34.2 g/dL (ref 30.0–36.0)
MCV: 95.9 fL (ref 80.0–100.0)
Platelets: 245 10*3/uL (ref 150–400)
RBC: 3.69 MIL/uL — ABNORMAL LOW (ref 3.87–5.11)
RDW: 14.7 % (ref 11.5–15.5)
WBC: 9 10*3/uL (ref 4.0–10.5)
nRBC: 0 % (ref 0.0–0.2)

## 2023-08-24 LAB — TROPONIN I (HIGH SENSITIVITY)
Troponin I (High Sensitivity): 45 ng/L — ABNORMAL HIGH (ref <18)
Troponin I (High Sensitivity): 46 ng/L — ABNORMAL HIGH (ref <18)

## 2023-08-24 LAB — CK: Total CK: 604 U/L — ABNORMAL HIGH (ref 38–234)

## 2023-08-24 LAB — LIPASE, BLOOD: Lipase: 27 U/L (ref 11–51)

## 2023-08-24 MED ORDER — TETANUS-DIPHTH-ACELL PERTUSSIS 5-2.5-18.5 LF-MCG/0.5 IM SUSY
0.5000 mL | PREFILLED_SYRINGE | Freq: Once | INTRAMUSCULAR | Status: AC
  Administered 2023-08-24: 0.5 mL via INTRAMUSCULAR
  Filled 2023-08-24: qty 0.5

## 2023-08-24 MED ORDER — SODIUM CHLORIDE 0.9 % IV BOLUS
1000.0000 mL | Freq: Once | INTRAVENOUS | Status: AC
  Administered 2023-08-24: 1000 mL via INTRAVENOUS

## 2023-08-24 MED ORDER — IOHEXOL 300 MG/ML  SOLN
75.0000 mL | Freq: Once | INTRAMUSCULAR | Status: AC | PRN
  Administered 2023-08-24: 75 mL via INTRAVENOUS

## 2023-08-24 NOTE — ED Notes (Signed)
Patient transported to CT 

## 2023-08-24 NOTE — ED Triage Notes (Signed)
The patient stated her legs gave out last night around 11pm. She stated she hit her head on the door frame. She denied LOC. She laid in the floor for 6 hours. She is having pain all over and weakness.

## 2023-08-24 NOTE — Discharge Instructions (Addendum)
It was a pleasure caring for you today in the emergency department.  Please increase your liquid intake over the next week.  Try to increase your calorie intake over the next few weeks as well.  Consider adding meal replacement/supplement shakes such as boost or Ensure.   Please return to the emergency department for any worsening or worrisome symptoms.

## 2023-08-24 NOTE — ED Provider Notes (Signed)
[facility] Provider Note  CSN: [id number] Arrival date & time: 08/24/23 4098  Chief Complaint(s) Fall, Head Injury, and Weakness  HPI Dana [name] is a 88 y.o. female with past medical history as below, significant for arthritis, GERD, lung cancer, hypertension, COPD, chronic right knee pain who presents to the ED with complaint of fall.  Patient reports she was ambulating in her residence last night with her rollator, she has chronic weakness to her right leg and right knee.  She fell to the ground, hit her head on the wall, did not have LOC.  Unable to get up off the floor until this morning with family sister.  No nausea or vomiting.  No chest pain or dyspnea.  No incontinence.  No change in bowel or bladder function.  He is having some flank pain feels similar to prior kidney stone.  No fevers or chills.  No recent medication or diet changes. Past Medical History Past Medical History:  Diagnosis Date   Anemia    Arthritis    HANDS,  WRISTS   COPD (chronic obstructive pulmonary disease) (HCC)    Depression 04/04/2017   Dyspnea    Dyspnea on exertion    Encounter for therapeutic drug monitoring 10/01/2016   GERD (gastroesophageal reflux disease)    at times   Hemorrhoid    History of anal fissures    History of kidney stones    History of lung cancer ONCOLOGIST--  DR Arbutus Ped--  LAST CT ,  NO RECURRENCE OR METS   DX JAN 2010 --  STAGE IIIA  NON-SMALL CELL ADENOCARCINOMA (RIGHT MIDDLE LOBE)---  S/P  CHEMORADIATIO THERAPY  (COMPLETE 03-21-2009)   HTN (hypertension) 10/10/2017   white coat syndrome   Imbalance 06/04/2017   lung ca dx'd 2010   Normal cardiac stress test    2007  PER PT   Osteoporosis    Rash, skin    RIGHT FOREARM/ HAND   Right wrist pain    MASS   Wears glasses    Patient Active Problem List   Diagnosis Date Noted   Hypokalemia 12/26/2022   Hypomagnesemia 12/26/2022   Acute upper respiratory infection  12/26/2022   Protein-calorie malnutrition, severe 12/26/2022   CAP (community acquired pneumonia) 12/25/2022   AKI (acute kidney injury) (HCC) 09/22/2020   Diarrhea 09/22/2020   Hypotension 09/22/2020   C. difficile diarrhea 09/22/2020   Choledocholithiasis 09/21/2020   COPD GOLD 0/ lama/laba responsive 07/11/2019   GERD clinical dx only  07/11/2019   HTN (hypertension) 10/10/2017   Imbalance 06/04/2017   Depression 04/04/2017   Left ankle injury, initial encounter 02/28/2017   Encounter for therapeutic drug monitoring 10/01/2016   DOE (dyspnea on exertion) 07/05/2016   Pleural effusion, right 07/13/2014   Right arm cellulitis 02/03/2014   Cancer of right lung parenchyma (HCC) 11/12/2011   Home Medication(s) Prior to Admission medications   Medication Sig Start Date End Date Taking? Authorizing Provider  b complex vitamins capsule Take 1 capsule by mouth daily.    [provider]  Biotin 5000 MCG TABS Take 5,000 mcg by mouth daily.    [provider]  bismuth subsalicylate (PEPTO BISMOL) 262 MG/15ML suspension Take 30 mLs by mouth daily as needed for indigestion or diarrhea or loose stools.    [provider]  CALCIUM CITRATE PO Take 600 mg by mouth 2 (two) times daily.    [provider]  cholecalciferol (VITAMIN D) 1000 UNITS tablet Take  2,000 Units by mouth daily.    [provider]  erlotinib (TARCEVA) 100 MG tablet Take 1 tablet (100 mg total) by mouth daily. 06/26/23   Si Gaul, MD  hydrocortisone 2.5 % cream Apply 1 application  topically 2 (two) times a week. 05/08/22   [provider]  lactase (LACTAID) 3000 units tablet Take 3,000 Units by mouth daily as needed (when eating dairy products).    [provider]  loratadine (CLARITIN) 10 MG tablet Take 10 mg by mouth daily as needed for allergies.    [provider]  mirtazapine (REMERON) 15 MG tablet Take 1 tablet (15 mg total) by mouth at  bedtime. Patient taking differently: Take 7.5 mg by mouth at bedtime. 07/18/23   Heilingoetter, Cassandra L, PA-C  Multiple Vitamins-Minerals (PRESERVISION AREDS 2 PO) Take 1 tablet by mouth 2 (two) times daily.    [provider]  Polyethyl Glycol-Propyl Glycol (SYSTANE OP) Place 1 drop into both eyes at bedtime.    [provider]  Probiotic Product (PROBIOTIC ACIDOPHILUS) CHEW Chew 1 tablet by mouth daily.    [provider]  psyllium (METAMUCIL) 58.6 % packet Take 1 packet by mouth every morning.    [provider]  Simethicone (GAS-X PO) Take 1 tablet by mouth 2 (two) times daily as needed (for gas).     [provider]  tacrolimus (PROGRAF) 1 MG capsule 1 mg See admin instructions. Dissolve  1 mg capsule in 1/2 liter of water soaking a cotton ball and apply to wound daily 08/08/22   [provider]  Tiotropium Bromide-Olodaterol (STIOLTO RESPIMAT) 2.5-2.5 MCG/ACT AERS INHALE 2 PUFFS BY MOUTH ONCE DAILY Patient taking differently: Inhale 2 puffs into the lungs daily. 07/10/22   Nyoka Cowden, MD  vitamin B-12 (CYANOCOBALAMIN) 1000 MCG tablet Take 1,000 mcg by mouth daily.    [provider]                                                                                                                                    Past Surgical History Past Surgical History:  Procedure Laterality Date   ANAL FISSURE REPAIR  07/09/2011   INTERNAL SPHINCTEROTOMY   BENIGN EXCISION LEFT BREAST CENTRAL DUCT  02/1999   BREAST BIOPSY  01/31/2009   benign   BREAST EXCISIONAL BIOPSY Left 2004   benign   CHEST TUBE INSERTION Right 07/14/2014   Procedure: INSERTION PLEURAL DRAINAGE CATHETER RIGHT CHEST;  Surgeon: Delight Ovens, MD;  Location: [facility] OR;  Service: Thoracic;  Laterality: Right;   DIRECT LARYNGOSCOPY WITH RADIAESSE INJECTION Bilateral 08/05/2023   Procedure: SUSPENDED MIRCO DIRECT LARYNGOSCOPY WITH PROLARYN INJECTION;  Surgeon: Christia Reading, MD;  Location: [facility] OR;  Service: ENT;  Laterality: Bilateral;   EXCISION RIGHT WRIST MASS  2012   EXTRACORPOREAL SHOCK WAVE LITHOTRIPSY Left 06/01/2019   Procedure: EXTRACORPOREAL SHOCK WAVE LITHOTRIPSY (ESWL);  Surgeon: Malen Gauze,  MD;  Location: [facility] ORS;  Service: Urology;  Laterality: Left;   IR ANGIOGRAM EXTREMITY RIGHT  07/09/2023   IR ANGIOGRAM SELECTIVE EACH ADDITIONAL VESSEL  07/09/2023   IR EMBO TUMOR ORGAN ISCHEMIA INFARCT INC GUIDE ROADMAPPING  07/09/2023   IR RADIOLOGIST EVAL & MGMT  05/28/2023   IR US GUIDE VASC ACCESS RIGHT  07/09/2023   KNEE ARTHROSCOPY Right 1999   MASS EXCISION Right 03/11/2014   Procedure: RIGHT WRIST DEEP MASS EXCISION WITH CULTURE AND BIOSPY;  Surgeon: Sharma Covert, MD;  Location: [facility];  Service: Orthopedics;  Laterality: Right;   MICROLARYNGOSCOPY W/VOCAL CORD INJECTION Bilateral 02/08/2021   Procedure: MICROLARYNGOSCOPY WITH VOCAL CORD INJECTION;  Surgeon: Christia Reading, MD;  Location: [facility] OR;  Service: ENT;  Laterality: Bilateral;   REMOVAL OF PLEURAL DRAINAGE CATHETER Right 10/14/2014   Procedure: REMOVAL OF PLEURAL DRAINAGE CATHETER;  Surgeon: Delight Ovens, MD;  Location: [facility] OR;  Service: Thoracic;  Laterality: Right;   TALC PLEURODESIS Right 09/16/2014   Procedure: TALC PLEURADESIS/ slurry;  Surgeon: Delight Ovens, MD;  Location: [facility] OR;  Service: Thoracic;  Laterality: Right;   THORACOSCOPY  01-26-2009   w/   lung/  node biopsy's   THUMB ARTHROSCOPY Left    - removed bone spur   TONSILLECTOMY  AS CHILD   TOTAL ABDOMINAL HYSTERECTOMY W/ BILATERAL SALPINGOOPHORECTOMY  1982   W/  APPENDECTOMY   TRANSTHORACIC ECHOCARDIOGRAM  12-23-2008   NORMAL LV/  EF 65-70%/  MILD MR  &  TR   VAULT SUSPENSION PLUS CYSTOCELE REPAIR WITH GRAFT  06-13-2010   Family History Family History  Problem Relation Age of Onset   Cancer Father        bladder   Cancer Son        kidney - removed as a teenager    Social  History Social History   Tobacco Use   Smoking status: Former    Current packs/day: 0.00    Average packs/day: 1 pack/day for 30.0 years (30.0 ttl pk-yrs)    Types: Cigarettes    Start date: 12/25/1947    Quit date: 12/24/1977    Years since quitting: 45.6   Smokeless tobacco: Never  Vaping Use   Vaping status: Never Used  Substance Use Topics   Alcohol use: No   Drug use: No   Allergies Methotrexate, Clindamycin hcl, Amoxicillin, and Protonix [pantoprazole sodium]  Review of Systems Review of Systems  Constitutional:  Positive for fatigue. Negative for chills and fever.  Respiratory:  Negative for chest tightness and shortness of breath.   Cardiovascular:  Negative for chest pain and palpitations.  Gastrointestinal:  Negative for abdominal pain, nausea and vomiting.  Genitourinary:  Positive for flank pain. Negative for dysuria.  Musculoskeletal:  Positive for arthralgias.    Physical Exam Vital Signs  I have reviewed the triage vital signs BP (!) 166/87   Pulse (!) 106   Temp 97.9 F (36.6 C) (Oral)   Resp (!) 22   Ht 5\' 4"  (1.626 m)   Wt 45.4 kg   SpO2 98%   BMI 17.16 kg/m  Physical Exam Vitals and nursing note reviewed.  Constitutional:      General: She is not in acute distress.    Appearance: Normal appearance.     Comments: Frail  HENT:     Head: Normocephalic and atraumatic.     Right Ear: External ear normal.     Left Ear: External ear normal.  Nose: Nose normal.     Mouth/Throat:     Mouth: Mucous membranes are moist.  Eyes:     General: No scleral icterus.       Right eye: No discharge.        Left eye: No discharge.     Extraocular Movements: Extraocular movements intact.     Pupils: Pupils are equal, round, and reactive to light.  Cardiovascular:     Rate and Rhythm: Regular rhythm. Tachycardia present.     Pulses: Normal pulses.     Heart sounds: Normal heart sounds.  Pulmonary:     Effort: Pulmonary effort is normal. No respiratory  distress.     Breath sounds: Normal breath sounds. No stridor.  Abdominal:     General: Abdomen is flat. There is no distension.     Palpations: Abdomen is soft.     Tenderness: There is no abdominal tenderness.  Musculoskeletal:     Cervical back: No rigidity.     Right lower leg: No edema.     Left lower leg: No edema.     Comments: No midline spinous process tenderness to palpation or percussion, no crepitus or step-off.     Skin:    General: Skin is warm and dry.     Capillary Refill: Capillary refill takes less than 2 seconds.       Neurological:     Mental Status: She is alert and oriented to person, place, and time. Mental status is at baseline.     GCS: GCS eye subscore is 4. GCS verbal subscore is 5. GCS motor subscore is 6.     Cranial Nerves: Cranial nerves 2-12 are intact.     Sensory: Sensation is intact.     Motor: Motor function is intact.     Coordination: Coordination is intact.     Comments: Gait testing deferred secondary to patient safety.   Psychiatric:        Mood and Affect: Mood normal.        Behavior: Behavior normal. Behavior is cooperative.     ED Results and Treatments Labs (all labs ordered are listed, but only abnormal results are displayed) Labs Reviewed  BASIC METABOLIC PANEL - Abnormal; Notable for the following components:      Result Value   Potassium 3.3 (*)    Glucose, Bld 138 (*)    BUN 26 (*)    Calcium 8.5 (*)    All other components within normal limits  CBC - Abnormal; Notable for the following components:   RBC 3.69 (*)    HCT 35.4 (*)    All other components within normal limits  URINALYSIS, ROUTINE W REFLEX MICROSCOPIC - Abnormal; Notable for the following components:   Hgb urine dipstick TRACE (*)    All other components within normal limits  CK - Abnormal; Notable for the following components:   Total CK 604 (*)    All other components within normal limits  HEPATIC FUNCTION PANEL - Abnormal; Notable for the following  components:   Total Protein 6.0 (*)    Albumin 3.2 (*)    AST 46 (*)    Total Bilirubin 2.0 (*)    Bilirubin, Direct 0.3 (*)    Indirect Bilirubin 1.7 (*)    All other components within normal limits  URINALYSIS, MICROSCOPIC (REFLEX) - Abnormal; Notable for the following components:   Bacteria, UA FEW (*)    All other components within normal limits  TROPONIN I (HIGH SENSITIVITY) -  Abnormal; Notable for the following components:   Troponin I (High Sensitivity) 45 (*)    All other components within normal limits  TROPONIN I (HIGH SENSITIVITY) - Abnormal; Notable for the following components:   Troponin I (High Sensitivity) 46 (*)    All other components within normal limits  LIPASE, BLOOD  CBG MONITORING, ED                                                                                                                          Radiology CT ABDOMEN PELVIS W CONTRAST  Result Date: 08/24/2023 CLINICAL DATA:  Abdominal pain, bilateral flank pain. EXAM: CT ABDOMEN AND PELVIS WITH CONTRAST TECHNIQUE: Multidetector CT imaging of the abdomen and pelvis was performed using the standard protocol following bolus administration of intravenous contrast. RADIATION DOSE REDUCTION: This exam was performed according to the departmental dose-optimization program which includes automated exposure control, adjustment of the mA and/or kV according to patient size and/or use of iterative reconstruction technique. CONTRAST:  75mL OMNIPAQUE IOHEXOL 300 MG/ML  SOLN COMPARISON:  CT abdomen and pelvis dated 09/21/2020. FINDINGS: Lower chest: There is mild right basilar atelectasis/scarring. Hepatobiliary: No focal liver abnormality is seen. Gallstones are seen in the gallbladder. No gallbladder wall thickening or biliary dilatation. Pancreas: Unremarkable. No pancreatic ductal dilatation or surrounding inflammatory changes. Spleen: Normal in size without focal abnormality. Adrenals/Urinary Tract: Adrenal glands are  unremarkable. Kidneys are normal, without renal calculi, focal lesion, or hydronephrosis. Indistinct density along the posterior aspect of the bladder wall near the right ureterovesical junction measures approximately 2.6 x 0.7 x 1.0 cm (series 604, image 67 and series 306, image 65). Stomach/Bowel: Stomach is within normal limits. The appendix is not definitely identified, however no pericecal inflammatory changes are noted to suggest acute appendicitis. No evidence of bowel wall thickening, distention, or inflammatory changes. Vascular/Lymphatic: Aortic atherosclerosis. No enlarged abdominal or pelvic lymph nodes. Reproductive: Status post hysterectomy. No adnexal masses. Other: No abdominal wall hernia or abnormality. No abdominopelvic ascites. Musculoskeletal: Severe degenerative changes are seen in the spine. IMPRESSION: 1. Indistinct density along the posterior aspect of the bladder wall near the right ureterovesical junction may represent a partially calcified mass. This could be further evaluated with CT abdomen and pelvis hematuria protocol. 2. Cholelithiasis without evidence of cholecystitis. Aortic Atherosclerosis (ICD10-I70.0). Electronically Signed   By: Romona Curls M.D.   On: 08/24/2023 12:31   CT Cervical Spine Wo Contrast  Result Date: 08/24/2023 CLINICAL DATA:  Neck trauma. Patient states that her legs gave out last night and then she fell, striking her head on the door frame. Down for 6 hours. EXAM: CT CERVICAL SPINE WITHOUT CONTRAST TECHNIQUE: Multidetector CT imaging of the cervical spine was performed without intravenous contrast. Multiplanar CT image reconstructions were also generated. RADIATION DOSE REDUCTION: This exam was performed according to the departmental dose-optimization program which includes automated exposure control, adjustment of the mA and/or kV according to patient size and/or use of iterative reconstruction technique. COMPARISON:  CT cervical spine 10/10/2022.  Chest  CT 07/15/2023. FINDINGS: Alignment: Stable alignment with a mild cervicothoracic scoliosis and a mild degenerative anterolisthesis at C4-5. Skull base and vertebrae: No evidence of acute fracture or traumatic subluxation. Chronic partially calcified pannus surrounding the odontoid process. Soft tissues and spinal canal: No prevertebral fluid or swelling. No visible canal hematoma. Disc levels: Similar multilevel spondylosis with disc space narrowing, uncinate spurring and facet hypertrophy, greatest from C4-5 through C6-7. Associated multilevel spinal stenosis and foraminal narrowing. Upper chest: Aortic and great vessel atherosclerosis. A known part solid nodule at the left apex appears grossly stable from the recent chest CT, the solid component measuring approximately 9 x 3 mm on image 58/304. Grossly stable radiation changes medially in the right upper lobe and underlying emphysema. Other: None. IMPRESSION: 1. No evidence of acute cervical spine fracture, traumatic subluxation or static signs of instability. 2. Similar multilevel cervical spondylosis with multilevel spinal stenosis and foraminal narrowing. 3. Grossly stable part solid nodule at the left apex compared with recent chest CT of last month. Please refer to that report for follow-up recommendations. 4.  Aortic Atherosclerosis (ICD10-I70.0). Electronically Signed   By: Carey Bullocks M.D.   On: 08/24/2023 12:02   CT HEAD WO CONTRAST  Result Date: 08/24/2023 CLINICAL DATA:  Patient's late gave out and she fell, hitting head on door frame. Weakness. EXAM: CT HEAD WITHOUT CONTRAST TECHNIQUE: Contiguous axial images were obtained from the base of the skull through the vertex without intravenous contrast. RADIATION DOSE REDUCTION: This exam was performed according to the departmental dose-optimization program which includes automated exposure control, adjustment of the mA and/or kV according to patient size and/or use of iterative reconstruction  technique. COMPARISON:  10/10/2022 FINDINGS: Brain: There is no evidence for acute hemorrhage, hydrocephalus, mass lesion, or abnormal extra-axial fluid collection. No definite CT evidence for acute infarction. Diffuse loss of parenchymal volume is consistent with atrophy. Patchy low attenuation in the deep hemispheric and periventricular white matter is nonspecific, but likely reflects chronic microvascular ischemic demyelination. Vascular: No hyperdense vessel or unexpected calcification. Skull: No evidence for fracture. No worrisome lytic or sclerotic lesion. Sinuses/Orbits: The visualized paranasal sinuses and mastoid air cells are clear. Visualized portions of the globes and intraorbital fat are unremarkable. Other: None. IMPRESSION: 1. No acute intracranial abnormality. 2. Atrophy with chronic small vessel white matter ischemic disease. Electronically Signed   By: Kennith Center M.D.   On: 08/24/2023 12:01   DG Chest Portable 1 View  Result Date: 08/24/2023 CLINICAL DATA:  Fall with pain. EXAM: PORTABLE CHEST 1 VIEW COMPARISON:  12/25/2022 FINDINGS: Lungs are hyperexpanded. Similar chronic atelectasis or scarring at the right base with probable small right pleural effusion. Left lung clear. Interstitial markings are diffusely coarsened with chronic features. Cardiopericardial silhouette is at upper limits of normal for size. Bones are diffusely demineralized. Telemetry leads overlie the chest. IMPRESSION: Chronic atelectasis or scarring at the right base with probable small right pleural effusion. Electronically Signed   By: Kennith Center M.D.   On: 08/24/2023 10:27    Pertinent labs & imaging results that were available during my care of the patient were reviewed by me and considered in my medical decision making (see MDM for details).  Medications Ordered in ED Medications  sodium chloride 0.9 % bolus 1,000 mL ( Intravenous Stopped 08/24/23 1056)  Tdap (BOOSTRIX) injection 0.5 mL (0.5 mLs  Intramuscular Given 08/24/23 1024)  iohexol (OMNIPAQUE) 300 MG/ML solution 75 mL (75 mLs Intravenous Contrast Given  08/24/23 1141)                                                                                                                                     Procedures Procedures  (including critical care time)  Medical Decision Making / ED Course    Medical Decision Making:    TENNY RAUBER is a 88 y.o. female with past medical history as below, significant for arthritis, GERD, lung cancer, hypertension, COPD, chronic right knee pain who presents to the ED with complaint of fall.. The complaint involves an extensive differential diagnosis and also carries with it a high risk of complications and morbidity.  Serious etiology was considered. Ddx includes but is not limited to: Differential diagnoses for head trauma includes subdural hematoma, epidural hematoma, acute concussion, traumatic subarachnoid hemorrhage, cerebral contusions, Differential diagnosis includes but is not exclusive to acute cholecystitis, intrathoracic causes for epigastric abdominal pain, gastritis, duodenitis, pancreatitis, small bowel or large bowel obstruction, abdominal aortic aneurysm, hernia, gastritis, etc.   Complete initial physical exam performed, notably the patient  was no acute distress, sitting upright on stretcher, HDS.    Reviewed and confirmed nursing documentation for past medical history, family history, social history.  Vital signs reviewed.   Patient with fall last night, she has chronic right knee pain and leg weakness, she fell while using a rollator, hit her head on the wall did not have LOC.  Unable to get up.. Will collect screening labs, get CT imaging of head C-spine and abdomen, IV fluids, update tetanus  Clinical Course as of 08/24/23 1528  Sat Aug 24, 2023  1443 Feeling better, labs/imaging stable [SG]    Clinical Course User Index [SG] Sloan Leiter, DO     CT imaging stable.   Cholelithiasis noted on CT, query calcified mass noted around the bladder on CT; advised that she follow-up PCP in regards to this.  She has mild rhabdomyolysis, CK is around 600, kidney function is stable.  She is tolerant p.o. intake.  Troponin mildly elevated, delta is flat but no chest pain.  Mildly elevated from baseline.  EKG stable, chest x-ray stable.  Patient with apparent mechanical fall at home, unable to get up from the floor secondary to deconditioning, chronic weakness, chronic debility.  She uses walker at baseline.  She is feeling much better on recheck.  Tolerating p.o. intake, eating food without difficulty.  Recommend close patient follow-up with PCP in the next week for recheck of labs.  May benefit from PT and home.  Defer to PCP.  The patient improved significantly and was discharged in stable condition. Detailed discussions were had with the patient regarding current findings, and need for close f/u with PCP or on call doctor. The patient has been instructed to return immediately if the symptoms worsen in any way for re-evaluation. Patient verbalized understanding and is in agreement with current care plan. All questions answered  prior to discharge.                   Additional history obtained: -Additional history obtained from spouse/daughter -External records from outside source obtained and reviewed including: Chart review including previous notes, labs, imaging, consultation notes including  Prior labs Prior primary care documentation   Lab Tests: -I ordered, reviewed, and interpreted labs.   The pertinent results include:   Labs Reviewed  BASIC METABOLIC PANEL - Abnormal; Notable for the following components:      Result Value   Potassium 3.3 (*)    Glucose, Bld 138 (*)    BUN 26 (*)    Calcium 8.5 (*)    All other components within normal limits  CBC - Abnormal; Notable for the following components:   RBC 3.69 (*)    HCT 35.4 (*)    All other  components within normal limits  URINALYSIS, ROUTINE W REFLEX MICROSCOPIC - Abnormal; Notable for the following components:   Hgb urine dipstick TRACE (*)    All other components within normal limits  CK - Abnormal; Notable for the following components:   Total CK 604 (*)    All other components within normal limits  HEPATIC FUNCTION PANEL - Abnormal; Notable for the following components:   Total Protein 6.0 (*)    Albumin 3.2 (*)    AST 46 (*)    Total Bilirubin 2.0 (*)    Bilirubin, Direct 0.3 (*)    Indirect Bilirubin 1.7 (*)    All other components within normal limits  URINALYSIS, MICROSCOPIC (REFLEX) - Abnormal; Notable for the following components:   Bacteria, UA FEW (*)    All other components within normal limits  TROPONIN I (HIGH SENSITIVITY) - Abnormal; Notable for the following components:   Troponin I (High Sensitivity) 45 (*)    All other components within normal limits  TROPONIN I (HIGH SENSITIVITY) - Abnormal; Notable for the following components:   Troponin I (High Sensitivity) 46 (*)    All other components within normal limits  LIPASE, BLOOD  CBG MONITORING, ED    Notable for mild rhabdo, mild trop leak  EKG   EKG Interpretation Date/Time:  Saturday August 24 2023 08:59:52 EDT Ventricular Rate:  107 PR Interval:  140 QRS Duration:  77 QT Interval:  339 QTC Calculation: 453 R Axis:   -43  Text Interpretation: Sinus tachycardia Multiple ventricular premature complexes Probable left atrial enlargement Left anterior fascicular block Artifact in lead(s) I II aVR aVL Interpretation limited secondary to artifact Confirmed by Tanda Rockers ([id number]) on 08/24/2023 9:10:16 AM         Imaging Studies ordered: I ordered imaging studies including CTH Ctc/s ctap, cxr I independently visualized the following imaging with scope of interpretation limited to determining acute life threatening conditions related to emergency care; findings noted above, significant for as  above, stable I independently visualized and interpreted imaging. I agree with the radiologist interpretation   Medicines ordered and prescription drug management: Meds ordered this encounter  Medications   sodium chloride 0.9 % bolus 1,000 mL   Tdap (BOOSTRIX) injection 0.5 mL   iohexol (OMNIPAQUE) 300 MG/ML solution 75 mL    -I have reviewed the patients home medicines and have made adjustments as needed   Consultations Obtained: na   Cardiac Monitoring: The patient was maintained on a cardiac monitor.  I personally viewed and interpreted the cardiac monitored which showed an underlying rhythm of: NSR  Social Determinants of Health:  Diagnosis or treatment significantly limited by social determinants of health: former smoker   Reevaluation: After the interventions noted above, I reevaluated the patient and found that they have improved  Co morbidities that complicate the patient evaluation  Past Medical History:  Diagnosis Date   Anemia    Arthritis    HANDS,  WRISTS   COPD (chronic obstructive pulmonary disease) (HCC)    Depression 04/04/2017   Dyspnea    Dyspnea on exertion    Encounter for therapeutic drug monitoring 10/01/2016   GERD (gastroesophageal reflux disease)    at times   Hemorrhoid    History of anal fissures    History of kidney stones    History of lung cancer ONCOLOGIST--  DR Arbutus Ped--  LAST CT ,  NO RECURRENCE OR METS   DX JAN 2010 --  STAGE IIIA  NON-SMALL CELL ADENOCARCINOMA (RIGHT MIDDLE LOBE)---  S/P  CHEMORADIATIO THERAPY  (COMPLETE 03-21-2009)   HTN (hypertension) 10/10/2017   white coat syndrome   Imbalance 06/04/2017   lung ca dx'd 2010   Normal cardiac stress test    2007  PER PT   Osteoporosis    Rash, skin    RIGHT FOREARM/ HAND   Right wrist pain    MASS   Wears glasses       Dispostion: Disposition decision including need for hospitalization was considered, and patient discharged from emergency department.    Final  Clinical Impression(s) / ED Diagnoses Final diagnoses:  Fall, initial encounter  Skin tear of right elbow without complication, initial encounter  Physical deconditioning  Traumatic rhabdomyolysis, initial encounter [facility])  Biliary calculus of other site without obstruction        Sloan Leiter, DO 08/24/23 1528

## 2023-08-24 NOTE — ED Notes (Addendum)
ED provider at bedside.

## 2023-08-29 ENCOUNTER — Other Ambulatory Visit: Payer: Self-pay

## 2023-09-02 DIAGNOSIS — T796XXD Traumatic ischemia of muscle, subsequent encounter: Secondary | ICD-10-CM | POA: Diagnosis not present

## 2023-09-02 DIAGNOSIS — Z681 Body mass index (BMI) 19 or less, adult: Secondary | ICD-10-CM | POA: Diagnosis not present

## 2023-09-02 DIAGNOSIS — N3289 Other specified disorders of bladder: Secondary | ICD-10-CM | POA: Diagnosis not present

## 2023-09-02 DIAGNOSIS — R296 Repeated falls: Secondary | ICD-10-CM | POA: Diagnosis not present

## 2023-09-02 DIAGNOSIS — E440 Moderate protein-calorie malnutrition: Secondary | ICD-10-CM | POA: Diagnosis not present

## 2023-09-03 ENCOUNTER — Telehealth: Payer: Self-pay

## 2023-09-03 NOTE — Telephone Encounter (Signed)
Pt called reporting that Dana [name], our [employer] rep, directed her to call us about returning her knee brace. I clarified w/ Dana [name], who instructed that she will process the return and to just have the pt drop the brace by our office. Pt said she would be able to drop off the brace some time tomorrow. I told her that would be fine.

## 2023-09-05 ENCOUNTER — Encounter (HOSPITAL_COMMUNITY): Payer: Self-pay | Admitting: Radiology

## 2023-09-05 ENCOUNTER — Other Ambulatory Visit (HOSPITAL_COMMUNITY): Payer: Self-pay | Admitting: Interventional Radiology

## 2023-09-05 DIAGNOSIS — I2510 Atherosclerotic heart disease of native coronary artery without angina pectoris: Secondary | ICD-10-CM | POA: Diagnosis not present

## 2023-09-05 DIAGNOSIS — Z7951 Long term (current) use of inhaled steroids: Secondary | ICD-10-CM | POA: Diagnosis not present

## 2023-09-05 DIAGNOSIS — Z79891 Long term (current) use of opiate analgesic: Secondary | ICD-10-CM | POA: Diagnosis not present

## 2023-09-05 DIAGNOSIS — E440 Moderate protein-calorie malnutrition: Secondary | ICD-10-CM | POA: Diagnosis not present

## 2023-09-05 DIAGNOSIS — M6284 Sarcopenia: Secondary | ICD-10-CM | POA: Diagnosis not present

## 2023-09-05 DIAGNOSIS — J449 Chronic obstructive pulmonary disease, unspecified: Secondary | ICD-10-CM | POA: Diagnosis not present

## 2023-09-05 DIAGNOSIS — M810 Age-related osteoporosis without current pathological fracture: Secondary | ICD-10-CM | POA: Diagnosis not present

## 2023-09-05 DIAGNOSIS — M25561 Pain in right knee: Secondary | ICD-10-CM

## 2023-09-05 DIAGNOSIS — M06031 Rheumatoid arthritis without rheumatoid factor, right wrist: Secondary | ICD-10-CM | POA: Diagnosis not present

## 2023-09-05 DIAGNOSIS — Z85118 Personal history of other malignant neoplasm of bronchus and lung: Secondary | ICD-10-CM | POA: Diagnosis not present

## 2023-09-05 DIAGNOSIS — T796XXD Traumatic ischemia of muscle, subsequent encounter: Secondary | ICD-10-CM | POA: Diagnosis not present

## 2023-09-05 DIAGNOSIS — Z87891 Personal history of nicotine dependence: Secondary | ICD-10-CM | POA: Diagnosis not present

## 2023-09-05 DIAGNOSIS — K219 Gastro-esophageal reflux disease without esophagitis: Secondary | ICD-10-CM | POA: Diagnosis not present

## 2023-09-05 DIAGNOSIS — M1711 Unilateral primary osteoarthritis, right knee: Secondary | ICD-10-CM

## 2023-09-05 DIAGNOSIS — E782 Mixed hyperlipidemia: Secondary | ICD-10-CM | POA: Diagnosis not present

## 2023-09-09 ENCOUNTER — Other Ambulatory Visit (HOSPITAL_COMMUNITY): Payer: Self-pay | Admitting: Interventional Radiology

## 2023-09-09 ENCOUNTER — Encounter (HOSPITAL_COMMUNITY): Payer: Self-pay | Admitting: Radiology

## 2023-09-09 DIAGNOSIS — M25561 Pain in right knee: Secondary | ICD-10-CM

## 2023-09-09 DIAGNOSIS — M1711 Unilateral primary osteoarthritis, right knee: Secondary | ICD-10-CM

## 2023-09-09 NOTE — Addendum Note (Signed)
Encounter addended by: Arby Barrette on: 09/09/2023 3:14 PM  Actions taken: Imaging Exam ended, Charge Capture section accepted

## 2023-09-11 ENCOUNTER — Encounter: Payer: Self-pay | Admitting: Family Medicine

## 2023-09-11 ENCOUNTER — Other Ambulatory Visit: Payer: Self-pay | Admitting: Family Medicine

## 2023-09-11 DIAGNOSIS — N3289 Other specified disorders of bladder: Secondary | ICD-10-CM

## 2023-09-12 ENCOUNTER — Other Ambulatory Visit (HOSPITAL_COMMUNITY): Payer: Self-pay

## 2023-09-12 DIAGNOSIS — M6284 Sarcopenia: Secondary | ICD-10-CM | POA: Diagnosis not present

## 2023-09-12 DIAGNOSIS — M810 Age-related osteoporosis without current pathological fracture: Secondary | ICD-10-CM | POA: Diagnosis not present

## 2023-09-12 DIAGNOSIS — E440 Moderate protein-calorie malnutrition: Secondary | ICD-10-CM | POA: Diagnosis not present

## 2023-09-12 DIAGNOSIS — Z7951 Long term (current) use of inhaled steroids: Secondary | ICD-10-CM | POA: Diagnosis not present

## 2023-09-12 DIAGNOSIS — M06031 Rheumatoid arthritis without rheumatoid factor, right wrist: Secondary | ICD-10-CM | POA: Diagnosis not present

## 2023-09-12 DIAGNOSIS — I2510 Atherosclerotic heart disease of native coronary artery without angina pectoris: Secondary | ICD-10-CM | POA: Diagnosis not present

## 2023-09-12 DIAGNOSIS — J449 Chronic obstructive pulmonary disease, unspecified: Secondary | ICD-10-CM | POA: Diagnosis not present

## 2023-09-12 DIAGNOSIS — Z79891 Long term (current) use of opiate analgesic: Secondary | ICD-10-CM | POA: Diagnosis not present

## 2023-09-12 DIAGNOSIS — T796XXD Traumatic ischemia of muscle, subsequent encounter: Secondary | ICD-10-CM | POA: Diagnosis not present

## 2023-09-12 DIAGNOSIS — Z87891 Personal history of nicotine dependence: Secondary | ICD-10-CM | POA: Diagnosis not present

## 2023-09-12 DIAGNOSIS — Z85118 Personal history of other malignant neoplasm of bronchus and lung: Secondary | ICD-10-CM | POA: Diagnosis not present

## 2023-09-12 DIAGNOSIS — E782 Mixed hyperlipidemia: Secondary | ICD-10-CM | POA: Diagnosis not present

## 2023-09-12 DIAGNOSIS — K219 Gastro-esophageal reflux disease without esophagitis: Secondary | ICD-10-CM | POA: Diagnosis not present

## 2023-09-16 ENCOUNTER — Other Ambulatory Visit: Payer: Self-pay | Admitting: Family Medicine

## 2023-09-16 DIAGNOSIS — N3289 Other specified disorders of bladder: Secondary | ICD-10-CM

## 2023-09-17 DIAGNOSIS — Z85118 Personal history of other malignant neoplasm of bronchus and lung: Secondary | ICD-10-CM | POA: Diagnosis not present

## 2023-09-17 DIAGNOSIS — M6284 Sarcopenia: Secondary | ICD-10-CM | POA: Diagnosis not present

## 2023-09-17 DIAGNOSIS — I2510 Atherosclerotic heart disease of native coronary artery without angina pectoris: Secondary | ICD-10-CM | POA: Diagnosis not present

## 2023-09-17 DIAGNOSIS — J449 Chronic obstructive pulmonary disease, unspecified: Secondary | ICD-10-CM | POA: Diagnosis not present

## 2023-09-17 DIAGNOSIS — E782 Mixed hyperlipidemia: Secondary | ICD-10-CM | POA: Diagnosis not present

## 2023-09-17 DIAGNOSIS — T796XXD Traumatic ischemia of muscle, subsequent encounter: Secondary | ICD-10-CM | POA: Diagnosis not present

## 2023-09-17 DIAGNOSIS — M810 Age-related osteoporosis without current pathological fracture: Secondary | ICD-10-CM | POA: Diagnosis not present

## 2023-09-17 DIAGNOSIS — M06031 Rheumatoid arthritis without rheumatoid factor, right wrist: Secondary | ICD-10-CM | POA: Diagnosis not present

## 2023-09-17 DIAGNOSIS — E440 Moderate protein-calorie malnutrition: Secondary | ICD-10-CM | POA: Diagnosis not present

## 2023-09-17 DIAGNOSIS — K219 Gastro-esophageal reflux disease without esophagitis: Secondary | ICD-10-CM | POA: Diagnosis not present

## 2023-09-17 DIAGNOSIS — Z87891 Personal history of nicotine dependence: Secondary | ICD-10-CM | POA: Diagnosis not present

## 2023-09-17 DIAGNOSIS — Z79891 Long term (current) use of opiate analgesic: Secondary | ICD-10-CM | POA: Diagnosis not present

## 2023-09-17 DIAGNOSIS — Z7951 Long term (current) use of inhaled steroids: Secondary | ICD-10-CM | POA: Diagnosis not present

## 2023-09-23 ENCOUNTER — Other Ambulatory Visit: Payer: Self-pay

## 2023-09-25 DIAGNOSIS — Z87891 Personal history of nicotine dependence: Secondary | ICD-10-CM | POA: Diagnosis not present

## 2023-09-25 DIAGNOSIS — M6284 Sarcopenia: Secondary | ICD-10-CM | POA: Diagnosis not present

## 2023-09-25 DIAGNOSIS — M06031 Rheumatoid arthritis without rheumatoid factor, right wrist: Secondary | ICD-10-CM | POA: Diagnosis not present

## 2023-09-25 DIAGNOSIS — Z7951 Long term (current) use of inhaled steroids: Secondary | ICD-10-CM | POA: Diagnosis not present

## 2023-09-25 DIAGNOSIS — I2510 Atherosclerotic heart disease of native coronary artery without angina pectoris: Secondary | ICD-10-CM | POA: Diagnosis not present

## 2023-09-25 DIAGNOSIS — K219 Gastro-esophageal reflux disease without esophagitis: Secondary | ICD-10-CM | POA: Diagnosis not present

## 2023-09-25 DIAGNOSIS — M810 Age-related osteoporosis without current pathological fracture: Secondary | ICD-10-CM | POA: Diagnosis not present

## 2023-09-25 DIAGNOSIS — Z79891 Long term (current) use of opiate analgesic: Secondary | ICD-10-CM | POA: Diagnosis not present

## 2023-09-25 DIAGNOSIS — E782 Mixed hyperlipidemia: Secondary | ICD-10-CM | POA: Diagnosis not present

## 2023-09-25 DIAGNOSIS — Z85118 Personal history of other malignant neoplasm of bronchus and lung: Secondary | ICD-10-CM | POA: Diagnosis not present

## 2023-09-25 DIAGNOSIS — E440 Moderate protein-calorie malnutrition: Secondary | ICD-10-CM | POA: Diagnosis not present

## 2023-09-25 DIAGNOSIS — J449 Chronic obstructive pulmonary disease, unspecified: Secondary | ICD-10-CM | POA: Diagnosis not present

## 2023-09-25 DIAGNOSIS — T796XXD Traumatic ischemia of muscle, subsequent encounter: Secondary | ICD-10-CM | POA: Diagnosis not present

## 2023-09-27 ENCOUNTER — Telehealth: Payer: Self-pay | Admitting: Family Medicine

## 2023-09-27 DIAGNOSIS — M1711 Unilateral primary osteoarthritis, right knee: Secondary | ICD-10-CM

## 2023-09-27 DIAGNOSIS — G8929 Other chronic pain: Secondary | ICD-10-CM

## 2023-09-27 NOTE — Telephone Encounter (Signed)
Pt daughter, Darel Hong called. Pt still struggling with R knee and has fallen in recent past. Pt is doing physical therapy and the therapist recommended going to [facility] for a new brace. Pt has not had any success with the brace we gave her from [facility].  Can we refer/send order to [facility] for this patient? Please inform daugher of outcome.  [facility] phone [phone] [facility] fax [phone]

## 2023-09-27 NOTE — Telephone Encounter (Signed)
To my team.  Okay to place referral/order.

## 2023-09-30 ENCOUNTER — Ambulatory Visit
Admission: RE | Admit: 2023-09-30 | Discharge: 2023-09-30 | Disposition: A | Discharge status: Home / Self Care | Payer: Medicare Other | Source: Ambulatory Visit | Attending: Family Medicine | Admitting: Family Medicine

## 2023-09-30 ENCOUNTER — Other Ambulatory Visit: Payer: Self-pay

## 2023-09-30 DIAGNOSIS — N329 Bladder disorder, unspecified: Secondary | ICD-10-CM | POA: Diagnosis not present

## 2023-09-30 DIAGNOSIS — M1711 Unilateral primary osteoarthritis, right knee: Secondary | ICD-10-CM

## 2023-09-30 DIAGNOSIS — G8929 Other chronic pain: Secondary | ICD-10-CM

## 2023-09-30 DIAGNOSIS — N3289 Other specified disorders of bladder: Secondary | ICD-10-CM

## 2023-09-30 MED ORDER — IOPAMIDOL (ISOVUE-370) INJECTION 76%
200.0000 mL | Freq: Once | INTRAVENOUS | Status: AC | PRN
  Administered 2023-09-30: 100 mL via INTRAVENOUS

## 2023-09-30 MED ORDER — AMBULATORY NON FORMULARY MEDICATION
0 refills | Status: AC
Start: 2023-09-30 — End: ?

## 2023-09-30 NOTE — Telephone Encounter (Signed)
Order placed. Fax sent  Left pt's daughter, Darel Hong, a VM that the knee brace order has been faxed to the [facility] and provided the phone number to call.

## 2023-10-01 NOTE — Telephone Encounter (Signed)
Pt just called [facility] and they did not receive our fax, please resend.

## 2023-10-01 NOTE — Telephone Encounter (Signed)
Fax was re-sent: transmission successful 10/01/23, 13:38.

## 2023-10-03 ENCOUNTER — Telehealth: Payer: Self-pay | Admitting: Medical Oncology

## 2023-10-03 ENCOUNTER — Other Ambulatory Visit: Payer: Self-pay

## 2023-10-03 DIAGNOSIS — J449 Chronic obstructive pulmonary disease, unspecified: Secondary | ICD-10-CM | POA: Diagnosis not present

## 2023-10-03 DIAGNOSIS — E782 Mixed hyperlipidemia: Secondary | ICD-10-CM | POA: Diagnosis not present

## 2023-10-03 DIAGNOSIS — Z713 Dietary counseling and surveillance: Secondary | ICD-10-CM | POA: Diagnosis not present

## 2023-10-03 DIAGNOSIS — E43 Unspecified severe protein-calorie malnutrition: Secondary | ICD-10-CM | POA: Diagnosis not present

## 2023-10-03 NOTE — Telephone Encounter (Signed)
Tarceva instructions- LVM for Jaleeah to take Tarceva on an empty stomach. It can be taken one hour before a meal or 2 hours after ingesting food.

## 2023-10-04 DIAGNOSIS — Z85118 Personal history of other malignant neoplasm of bronchus and lung: Secondary | ICD-10-CM | POA: Diagnosis not present

## 2023-10-04 DIAGNOSIS — K219 Gastro-esophageal reflux disease without esophagitis: Secondary | ICD-10-CM | POA: Diagnosis not present

## 2023-10-04 DIAGNOSIS — Z79891 Long term (current) use of opiate analgesic: Secondary | ICD-10-CM | POA: Diagnosis not present

## 2023-10-04 DIAGNOSIS — E440 Moderate protein-calorie malnutrition: Secondary | ICD-10-CM | POA: Diagnosis not present

## 2023-10-04 DIAGNOSIS — Z87891 Personal history of nicotine dependence: Secondary | ICD-10-CM | POA: Diagnosis not present

## 2023-10-04 DIAGNOSIS — J449 Chronic obstructive pulmonary disease, unspecified: Secondary | ICD-10-CM | POA: Diagnosis not present

## 2023-10-04 DIAGNOSIS — I2510 Atherosclerotic heart disease of native coronary artery without angina pectoris: Secondary | ICD-10-CM | POA: Diagnosis not present

## 2023-10-04 DIAGNOSIS — M810 Age-related osteoporosis without current pathological fracture: Secondary | ICD-10-CM | POA: Diagnosis not present

## 2023-10-04 DIAGNOSIS — M06031 Rheumatoid arthritis without rheumatoid factor, right wrist: Secondary | ICD-10-CM | POA: Diagnosis not present

## 2023-10-04 DIAGNOSIS — T796XXD Traumatic ischemia of muscle, subsequent encounter: Secondary | ICD-10-CM | POA: Diagnosis not present

## 2023-10-04 DIAGNOSIS — Z7951 Long term (current) use of inhaled steroids: Secondary | ICD-10-CM | POA: Diagnosis not present

## 2023-10-04 DIAGNOSIS — E782 Mixed hyperlipidemia: Secondary | ICD-10-CM | POA: Diagnosis not present

## 2023-10-04 DIAGNOSIS — M6284 Sarcopenia: Secondary | ICD-10-CM | POA: Diagnosis not present

## 2023-10-07 ENCOUNTER — Telehealth: Payer: Self-pay | Admitting: Medical Oncology

## 2023-10-07 NOTE — Telephone Encounter (Signed)
Percy returned call and understands when to take Tarceva.

## 2023-10-09 DIAGNOSIS — K219 Gastro-esophageal reflux disease without esophagitis: Secondary | ICD-10-CM | POA: Diagnosis not present

## 2023-10-09 DIAGNOSIS — T796XXD Traumatic ischemia of muscle, subsequent encounter: Secondary | ICD-10-CM | POA: Diagnosis not present

## 2023-10-09 DIAGNOSIS — Z87891 Personal history of nicotine dependence: Secondary | ICD-10-CM | POA: Diagnosis not present

## 2023-10-09 DIAGNOSIS — E440 Moderate protein-calorie malnutrition: Secondary | ICD-10-CM | POA: Diagnosis not present

## 2023-10-09 DIAGNOSIS — E782 Mixed hyperlipidemia: Secondary | ICD-10-CM | POA: Diagnosis not present

## 2023-10-09 DIAGNOSIS — Z79891 Long term (current) use of opiate analgesic: Secondary | ICD-10-CM | POA: Diagnosis not present

## 2023-10-09 DIAGNOSIS — M810 Age-related osteoporosis without current pathological fracture: Secondary | ICD-10-CM | POA: Diagnosis not present

## 2023-10-09 DIAGNOSIS — J449 Chronic obstructive pulmonary disease, unspecified: Secondary | ICD-10-CM | POA: Diagnosis not present

## 2023-10-09 DIAGNOSIS — M06031 Rheumatoid arthritis without rheumatoid factor, right wrist: Secondary | ICD-10-CM | POA: Diagnosis not present

## 2023-10-09 DIAGNOSIS — Z85118 Personal history of other malignant neoplasm of bronchus and lung: Secondary | ICD-10-CM | POA: Diagnosis not present

## 2023-10-09 DIAGNOSIS — M6284 Sarcopenia: Secondary | ICD-10-CM | POA: Diagnosis not present

## 2023-10-09 DIAGNOSIS — Z7951 Long term (current) use of inhaled steroids: Secondary | ICD-10-CM | POA: Diagnosis not present

## 2023-10-09 DIAGNOSIS — I2510 Atherosclerotic heart disease of native coronary artery without angina pectoris: Secondary | ICD-10-CM | POA: Diagnosis not present

## 2023-10-10 DIAGNOSIS — J383 Other diseases of vocal cords: Secondary | ICD-10-CM | POA: Diagnosis not present

## 2023-10-10 DIAGNOSIS — R490 Dysphonia: Secondary | ICD-10-CM | POA: Diagnosis not present

## 2023-10-14 NOTE — Progress Notes (Unsigned)
[facility] OFFICE PROGRESS NOTE  Gweneth Dimitri, MD [address] [place] [address]  DIAGNOSIS: Stage IIIB (T2a, N3, M0) non-small cell lung cancer, adenocarcinoma with positive EGFR mutation diagnosed in January 2010.   PRIOR THERAPY: 1) Status post concurrent chemoradiation with weekly carboplatin and paclitaxel; last dose given March 21, 2009.  2) Tarceva at 150 mg p.o. daily, status post approximately 48 months of treatment, discontinued secondary to persistent diarrhea.  3) status post right Pleurx catheter placement for right nonmalignant pleural effusion.  CURRENT THERAPY: Tarceva 100 mg by mouth daily started 03/19/2013.   INTERVAL HISTORY: Dana [name] 88 y.o. female returns to the clinic today for a follow-up visit.  The patient was last seen on 07/18/2023.  The patient is currently on dose reduced Tarceva for her non-small cell lung cancer which she has been on since 2014.  She tolerates it well without any concerning adverse side effects.  She knows how to manage the diarrhea and knows what not to eat to avoid diarrhea.  She has not needed to take Imodium.  Her last appointment she resumed taking 7.5 mg of Remeron due to decreased appetite.  She states she is eating fine at this time.  She gained approximately 5 pounds.  Her main concerns today are related to several other ongoing health problems.  She sees orthopedics in a week and a half because she has some right knee pain.  She is hopeful that she is going to get a knee brace and will feel better with increased mobility.  Her other concern is that she had a CT of the abdomen pelvis while performed in the emergency room on 08/24/2023 after presenting with a fall.  This on indistinct area in the bladder.  She recently had a follow-up CT scan on 09/30/2023 which shows concern for possible primary bladder cancer.  She is expected to see urology soon for this.    In the interval since last being seen, she saw  Dr. 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  Today, she tells me she got this taken care of.  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  She is expected to see her primary dermatologist in the next 2 weeks or so for another whole body skin check.  Today she denies any fever or night sweats.  She reports some shortness of breath related to decreased exercise tolerance secondary to knee pain.  She denies any chest pain or hemoptysis.  She denies any nausea, vomiting, or constipation.  Denies any headache. She is here today for evaluation and repeat blood work   MEDICAL HISTORY: Past Medical History:  Diagnosis Date   Anemia    Arthritis    HANDS,  WRISTS   COPD (chronic obstructive pulmonary disease) (HCC)    Depression 04/04/2017   Dyspnea    Dyspnea on exertion    Encounter for therapeutic drug monitoring 10/01/2016   GERD (gastroesophageal reflux disease)    at times   Hemorrhoid    History of anal fissures    History of kidney stones    History of lung cancer ONCOLOGIST--  DR Arbutus Ped--  LAST CT ,  NO RECURRENCE OR METS   DX JAN 2010 --  STAGE IIIA  NON-SMALL CELL ADENOCARCINOMA (RIGHT MIDDLE LOBE)---  S/P  CHEMORADIATIO THERAPY  (COMPLETE 03-21-2009)   HTN (hypertension) 10/10/2017   white coat syndrome   Imbalance  06/04/2017   lung ca dx'd 2010   Normal cardiac stress test    2007  PER PT   Osteoporosis    Rash, skin    RIGHT FOREARM/ HAND   Right wrist pain    MASS   Wears glasses     ALLERGIES:  is allergic to methotrexate, clindamycin hcl, amoxicillin, and protonix [pantoprazole sodium].  MEDICATIONS:  Current Outpatient Medications  Medication Sig Dispense Refill   AMBULATORY NON FORMULARY MEDICATION R knee brace Dispense 1 Dx code: M17.11 Use as needed 1 Device 0   b complex vitamins capsule Take 1 capsule by mouth daily.     Biotin 5000 MCG TABS Take 5,000 mcg by mouth daily.      bismuth subsalicylate (PEPTO BISMOL) 262 MG/15ML suspension Take 30 mLs by mouth daily as needed for indigestion or diarrhea or loose stools.     CALCIUM CITRATE PO Take 600 mg by mouth 2 (two) times daily.     cholecalciferol (VITAMIN D) 1000 UNITS tablet Take 2,000 Units by mouth daily.     erlotinib (TARCEVA) 100 MG tablet Take 1 tablet (100 mg total) by mouth daily. 30 tablet 3   hydrocortisone 2.5 % cream Apply 1 application  topically 2 (two) times a week.     lactase (LACTAID) 3000 units tablet Take 3,000 Units by mouth daily as needed (when eating dairy products).     loratadine (CLARITIN) 10 MG tablet Take 10 mg by mouth daily as needed for allergies.     mirtazapine (REMERON) 15 MG tablet Take 1 tablet (15 mg total) by mouth at bedtime. (Patient taking differently: Take 7.5 mg by mouth at bedtime.) 30 tablet 2   Multiple Vitamins-Minerals (PRESERVISION AREDS 2 PO) Take 1 tablet by mouth 2 (two) times daily.     Polyethyl Glycol-Propyl Glycol (SYSTANE OP) Place 1 drop into both eyes at bedtime.     Probiotic Product (PROBIOTIC ACIDOPHILUS) CHEW Chew 1 tablet by mouth daily.     psyllium (METAMUCIL) 58.6 % packet Take 1 packet by mouth every morning.     Simethicone (GAS-X PO) Take 1 tablet by mouth 2 (two) times daily as needed (for gas).      tacrolimus (PROGRAF) 1 MG capsule 1 mg See admin instructions. Dissolve  1 mg capsule in 1/2 liter of water soaking a cotton ball and apply to wound daily     Tiotropium Bromide-Olodaterol (STIOLTO RESPIMAT) 2.5-2.5 MCG/ACT AERS INHALE 2 PUFFS BY MOUTH ONCE DAILY (Patient taking differently: Inhale 2 puffs into the lungs daily.) 4 g 0   vitamin B-12 (CYANOCOBALAMIN) 1000 MCG tablet Take 1,000 mcg by mouth daily.     No current facility-administered medications for this visit.    SURGICAL HISTORY:  Past Surgical History:  Procedure Laterality Date   ANAL FISSURE REPAIR  07/09/2011   INTERNAL SPHINCTEROTOMY   BENIGN EXCISION LEFT BREAST  CENTRAL DUCT  02/1999   BREAST BIOPSY  01/31/2009   benign   BREAST EXCISIONAL BIOPSY Left 2004   benign   CHEST TUBE INSERTION Right 07/14/2014   Procedure: INSERTION PLEURAL DRAINAGE CATHETER RIGHT CHEST;  Surgeon: Delight Ovens, MD;  Location: [facility] OR;  Service: Thoracic;  Laterality: Right;   DIRECT LARYNGOSCOPY WITH RADIAESSE INJECTION Bilateral 08/05/2023   Procedure: SUSPENDED MIRCO DIRECT LARYNGOSCOPY WITH PROLARYN INJECTION;  Surgeon: Christia Reading, MD;  Location: [facility] OR;  Service: ENT;  Laterality: Bilateral;   EXCISION RIGHT WRIST MASS  2012   EXTRACORPOREAL SHOCK WAVE LITHOTRIPSY Left 06/01/2019  Procedure: EXTRACORPOREAL SHOCK WAVE LITHOTRIPSY (ESWL);  Surgeon: Malen Gauze, MD;  Location: [facility] ORS;  Service: Urology;  Laterality: Left;   IR ANGIOGRAM EXTREMITY RIGHT  07/09/2023   IR ANGIOGRAM SELECTIVE EACH ADDITIONAL VESSEL  07/09/2023   IR RADIOLOGIST EVAL & MGMT  05/28/2023   IR US GUIDE VASC ACCESS RIGHT  07/09/2023   KNEE ARTHROSCOPY Right 1999   MASS EXCISION Right 03/11/2014   Procedure: RIGHT WRIST DEEP MASS EXCISION WITH CULTURE AND BIOSPY;  Surgeon: Sharma Covert, MD;  Location: [facility];  Service: Orthopedics;  Laterality: Right;   MICROLARYNGOSCOPY W/VOCAL CORD INJECTION Bilateral 02/08/2021   Procedure: MICROLARYNGOSCOPY WITH VOCAL CORD INJECTION;  Surgeon: Christia Reading, MD;  Location: [facility] OR;  Service: ENT;  Laterality: Bilateral;   REMOVAL OF PLEURAL DRAINAGE CATHETER Right 10/14/2014   Procedure: REMOVAL OF PLEURAL DRAINAGE CATHETER;  Surgeon: Delight Ovens, MD;  Location: [facility] OR;  Service: Thoracic;  Laterality: Right;   TALC PLEURODESIS Right 09/16/2014   Procedure: TALC PLEURADESIS/ slurry;  Surgeon: Delight Ovens, MD;  Location: [facility] OR;  Service: Thoracic;  Laterality: Right;   THORACOSCOPY  01-26-2009   w/   lung/  node biopsy's   THUMB ARTHROSCOPY Left    - removed bone spur   TONSILLECTOMY  AS CHILD   TOTAL ABDOMINAL HYSTERECTOMY  W/ BILATERAL SALPINGOOPHORECTOMY  1982   W/  APPENDECTOMY   TRANSTHORACIC ECHOCARDIOGRAM  12-23-2008   NORMAL LV/  EF 65-70%/  MILD MR  &  TR   VAULT SUSPENSION PLUS CYSTOCELE REPAIR WITH GRAFT  06-13-2010    REVIEW OF SYSTEMS:   Review of Systems  Constitutional: Positive for stable fatigue. Negative for appetite change, chills, fever and unexpected weight change.  HENT: Negative for mouth sores, nosebleeds, sore throat and trouble swallowing.   Eyes: Negative for eye problems and icterus.  Respiratory: Positive for mild dyspnea on exertion. Negative for cough, hemoptysis, and wheezing.   Cardiovascular: Negative for chest pain and leg swelling.  Gastrointestinal: Negative for abdominal pain, constipation, diarrhea, nausea and vomiting.  Genitourinary: Negative for bladder incontinence, difficulty urinating, dysuria, frequency and hematuria.   Musculoskeletal: Positive for knee pain. Negative for back pain, gait problem, neck pain and neck stiffness.  Skin: Positive for few scattered skin lesions.   Neurological: Negative for dizziness, extremity weakness, gait problem, headaches, light-headedness and seizures.  Hematological: Negative for adenopathy. Does not bruise/bleed easily.  Psychiatric/Behavioral: Negative for confusion, depression and sleep disturbance. The patient is not nervous/anxious.     PHYSICAL EXAMINATION:  Blood pressure (!) 146/98, pulse 98, temperature 98.1 F (36.7 C), temperature source Oral, resp. rate 19, height 5\' 2"  (1.575 m), weight 105 lb 6.4 oz (47.8 kg), SpO2 97%.  ECOG PERFORMANCE STATUS: 2  Physical Exam  Constitutional: Oriented to person, place, and time and thin appearing [place] and in no distress.   HENT:  Head: Normocephalic and atraumatic.  Mouth/Throat: Oropharynx is clear and moist. No oropharyngeal exudate.  Eyes: Conjunctivae are normal. Right eye exhibits no discharge. Left eye exhibits no discharge. No scleral icterus.  Neck: Normal  range of motion. Neck supple.  Cardiovascular: Normal rate, regular rhythm, normal heart sounds and intact distal pulses.   Pulmonary/Chest: Effort normal and breath sounds normal. No respiratory distress. No wheezes. No rales.  Abdominal: Soft. Bowel sounds are normal. Exhibits no distension and no mass. There is no tenderness.  Musculoskeletal: Normal range of motion. Exhibits no edema.  Lymphadenopathy:    No cervical adenopathy.  Neurological: Alert and oriented to person, place, and time. Exhibits muscle wasting. She ambulates with a walker.  Skin: Skin is warm and dry. Skin discoloration on right wrist/forearm. Not diaphoretic. No erythema. No pallor.  Psychiatric: Mood, memory and judgment normal.  Vitals reviewed.  LABORATORY DATA: Lab Results  Component Value Date   WBC 6.6 10/17/2023   HGB 12.0 10/17/2023   HCT 34.6 (L) 10/17/2023   MCV 94.5 10/17/2023   PLT 240 10/17/2023      Chemistry      Component Value Date/Time   NA 138 08/24/2023 0900   NA 142 12/10/2017 1000   K 3.3 (L) 08/24/2023 0900   K 4.0 12/10/2017 1000   CL 105 08/24/2023 0900   CL 108 (H) 06/03/2013 0939   CO2 23 08/24/2023 0900   CO2 28 12/10/2017 1000   BUN 26 (H) 08/24/2023 0900   BUN 20.4 12/10/2017 1000   CREATININE 0.92 08/24/2023 0900   CREATININE 0.90 07/15/2023 1025   CREATININE 0.9 12/10/2017 1000      Component Value Date/Time   CALCIUM 8.5 (L) 08/24/2023 0900   CALCIUM 9.7 12/10/2017 1000   ALKPHOS 43 08/24/2023 0928   ALKPHOS 63 12/10/2017 1000   AST 46 (H) 08/24/2023 0928   AST 27 07/15/2023 1025   AST 25 12/10/2017 1000   ALT 32 08/24/2023 0928   ALT 21 07/15/2023 1025   ALT 17 12/10/2017 1000   BILITOT 2.0 (H) 08/24/2023 0928   BILITOT 1.2 07/15/2023 1025   BILITOT 1.05 12/10/2017 1000       RADIOGRAPHIC STUDIES:  CT ABDOMEN PELVIS W WO CONTRAST  Result Date: 10/11/2023 CLINICAL DATA:  Bladder lesion on CT, urinary frequency, history of lung cancer EXAM: CT  ABDOMEN AND PELVIS WITHOUT AND WITH CONTRAST TECHNIQUE: Multidetector CT imaging of the abdomen and pelvis was performed following the standard protocol before and following the bolus administration of intravenous contrast. RADIATION DOSE REDUCTION: This exam was performed according to the departmental dose-optimization program which includes automated exposure control, adjustment of the mA and/or kV according to patient size and/or use of iterative reconstruction technique. CONTRAST:  ISOVUE-370 IOPAMIDOL (ISOVUE-370) INJECTION 76% COMPARISON:  08/24/2023 FINDINGS: Lower chest: Suspected radiation changes in the right hemithorax, incompletely visualized. New 8 mm ground-glass nodule in the left lower lobe (series 4/image 12). Hepatobiliary: Liver is within normal limits. Layering small gallstones (series 2/image 29), without associated inflammatory changes. No intrahepatic or extrahepatic duct dilatation. Pancreas: Within normal limits. Spleen: Within normal limits. Adrenals/Urinary Tract: Adrenal glands are within normal limits. Kidneys are within normal limits. No renal, ureteral, or bladder calculi. No hydronephrosis. On delayed imaging, there are no filling defects in the bilateral opacified proximal collecting systems, ureters, or bladder. 1.2 x 2.9 cm enhancing lesion along the right posterolateral bladder (series 9/image 71), highly suspicious for primary bladder carcinoma. Stomach/Bowel: Stomach is within normal limits. No evidence of bowel obstruction. Appendix is not discretely visualized. No colonic wall thickening or inflammatory changes. Vascular/Lymphatic: No evidence of abdominal aortic aneurysm. Atherosclerotic calcifications of the abdominal aorta and branch vessels, although vessels remain patent. No suspicious abdominopelvic lymphadenopathy. Reproductive: Status post hysterectomy. No adnexal masses. Other: No abdominopelvic ascites. Musculoskeletal: Degenerative changes of the visualized  thoracolumbar spine. IMPRESSION: 2.9 cm enhancing lesion along the right posterolateral bladder, highly suspicious for primary bladder carcinoma. No evidence of metastatic disease in the abdomen/pelvis. New 8 mm ground-glass nodule in the left lower lobe, nonspecific. Infection/inflammation is favored. Additional ancillary findings as above. Electronically  Signed   By: Charline Bills M.D.   On: 10/11/2023 00:25     ASSESSMENT/PLAN:  This is a very pleasant 88 year old Caucasian female with stage IIIb non-small cell lung cancer, adenocarcinoma.  She is positive for an EGFR mutation.  She was diagnosed in January 2010.   She has been on Tarceva for over 10 years.  She is currently on dose reduced Tarceva at 100 mg p.o. daily.   Labs were reviewed. Her CBC is acceptable. Her CMP is pending.   Recommend that she continue on the same treatment at the same dose.  I will arrange for restaging CT scan of the chest prior to her next follow-up appointment.  I will arrange for her CT scan to be performed approximately 7 to 10 days prior to her follow-up visit which we will see her back in 3 months.  I will not order a CT of the abdomen and pelvis to avoid any duplicate scans as she is going to see urology soon for what appears to be suspicious for bladder cancer.  Briefly reviewed the findings on her recent CT from 09/30/2023 showing suspicious bladder lesion for primary bladder cancer.  She knows to keep her appointment with urology as scheduled.  If there are any concerns related to her Tarceva with the management of the findings in her bladder, she knows that she is welcome to call us.  Her recent CT scan did show a new 8 mm groundglass lesion which is favored to be infectious/inflammatory.  I reviewed with Dr. Arbutus Ped and we will arrange for her follow-up CT scan in 3 months as we are initially planning.  She will continue taking 15 mg of Remeron nightly.  She will continue taking Imodium if needed for  diarrhea, however, she has not needed this is much recently.  She will follow-up with orthopedics, dermatology, and urology as planned.  We briefly talked about life alert for falls.  She has thought about it but is not interested in this.  The patient was advised to call immediately if she has any concerning symptoms in the interval. The patient voices understanding of current disease status and treatment options and is in agreement with the current care plan. All questions were answered. The patient knows to call the clinic with any problems, questions or concerns. We can certainly see the patient much sooner if necessary      Orders Placed This Encounter  Procedures   CT Chest W Contrast    Standing Status:   Future    Standing Expiration Date:   10/16/2024    Order Specific Question:   If indicated for the ordered procedure, I authorize the administration of contrast media per Radiology protocol    Answer:   Yes    Order Specific Question:   Does the patient have a contrast media/X-ray dye allergy?    Answer:   No    Order Specific Question:   Preferred imaging location?    Answer:   [facility]   CBC with Differential (Cancer Center Only)    Standing Status:   Future    Standing Expiration Date:   10/16/2024   CMP (Cancer Center only)    Standing Status:   Future    Standing Expiration Date:   10/16/2024     The total time spent in the appointment was 20-29 minutes  Lee-Anne Flicker L Dayannara Pascal, PA-C 10/17/23

## 2023-10-15 DIAGNOSIS — Z79891 Long term (current) use of opiate analgesic: Secondary | ICD-10-CM | POA: Diagnosis not present

## 2023-10-15 DIAGNOSIS — Z85118 Personal history of other malignant neoplasm of bronchus and lung: Secondary | ICD-10-CM | POA: Diagnosis not present

## 2023-10-15 DIAGNOSIS — M6284 Sarcopenia: Secondary | ICD-10-CM | POA: Diagnosis not present

## 2023-10-15 DIAGNOSIS — K219 Gastro-esophageal reflux disease without esophagitis: Secondary | ICD-10-CM | POA: Diagnosis not present

## 2023-10-15 DIAGNOSIS — Z87891 Personal history of nicotine dependence: Secondary | ICD-10-CM | POA: Diagnosis not present

## 2023-10-15 DIAGNOSIS — J449 Chronic obstructive pulmonary disease, unspecified: Secondary | ICD-10-CM | POA: Diagnosis not present

## 2023-10-15 DIAGNOSIS — M06031 Rheumatoid arthritis without rheumatoid factor, right wrist: Secondary | ICD-10-CM | POA: Diagnosis not present

## 2023-10-15 DIAGNOSIS — M810 Age-related osteoporosis without current pathological fracture: Secondary | ICD-10-CM | POA: Diagnosis not present

## 2023-10-15 DIAGNOSIS — Z7951 Long term (current) use of inhaled steroids: Secondary | ICD-10-CM | POA: Diagnosis not present

## 2023-10-15 DIAGNOSIS — E440 Moderate protein-calorie malnutrition: Secondary | ICD-10-CM | POA: Diagnosis not present

## 2023-10-15 DIAGNOSIS — T796XXD Traumatic ischemia of muscle, subsequent encounter: Secondary | ICD-10-CM | POA: Diagnosis not present

## 2023-10-15 DIAGNOSIS — E782 Mixed hyperlipidemia: Secondary | ICD-10-CM | POA: Diagnosis not present

## 2023-10-15 DIAGNOSIS — I2510 Atherosclerotic heart disease of native coronary artery without angina pectoris: Secondary | ICD-10-CM | POA: Diagnosis not present

## 2023-10-16 ENCOUNTER — Other Ambulatory Visit: Payer: Self-pay | Admitting: Internal Medicine

## 2023-10-16 ENCOUNTER — Other Ambulatory Visit: Payer: Self-pay

## 2023-10-16 ENCOUNTER — Other Ambulatory Visit (HOSPITAL_COMMUNITY): Payer: Self-pay | Admitting: Pharmacy Technician

## 2023-10-16 ENCOUNTER — Other Ambulatory Visit (HOSPITAL_COMMUNITY): Payer: Self-pay

## 2023-10-16 DIAGNOSIS — C3490 Malignant neoplasm of unspecified part of unspecified bronchus or lung: Secondary | ICD-10-CM

## 2023-10-16 MED ORDER — ERLOTINIB HCL 100 MG PO TABS
100.0000 mg | ORAL_TABLET | Freq: Every day | ORAL | 3 refills | Status: DC
  Filled 2023-10-16: qty 30, 30d supply, fill #0
  Filled 2023-11-14: qty 30, 30d supply, fill #1
  Filled 2023-12-24: qty 30, 30d supply, fill #2
  Filled 2024-01-16: qty 30, 30d supply, fill #3

## 2023-10-16 NOTE — Progress Notes (Signed)
Specialty Pharmacy Refill Coordination Note  Dana [name] is a 88 y.o. female contacted today regarding refills of specialty medication(s) Erlotinib Hcl  Spoke with Daughter in law.  Patient requested Delivery   Delivery date: 10/23/23   Verified address: [address]  [place]   Medication will be filled on 10/22/23.   Refill request was sent to MD patient has appt on 10/16/24 with MD; Call if any delays

## 2023-10-17 ENCOUNTER — Other Ambulatory Visit: Payer: Self-pay

## 2023-10-17 ENCOUNTER — Inpatient Hospital Stay: Payer: Medicare Other | Attending: Internal Medicine

## 2023-10-17 ENCOUNTER — Inpatient Hospital Stay: Payer: Medicare Other | Admitting: Physician Assistant

## 2023-10-17 VITALS — BP 146/98 | HR 98 | Temp 98.1°F | Resp 19 | Ht 62.0 in | Wt 105.4 lb

## 2023-10-17 DIAGNOSIS — Z79899 Other long term (current) drug therapy: Secondary | ICD-10-CM | POA: Diagnosis not present

## 2023-10-17 DIAGNOSIS — J449 Chronic obstructive pulmonary disease, unspecified: Secondary | ICD-10-CM | POA: Insufficient documentation

## 2023-10-17 DIAGNOSIS — K219 Gastro-esophageal reflux disease without esophagitis: Secondary | ICD-10-CM | POA: Diagnosis not present

## 2023-10-17 DIAGNOSIS — Z8719 Personal history of other diseases of the digestive system: Secondary | ICD-10-CM | POA: Diagnosis not present

## 2023-10-17 DIAGNOSIS — Z85118 Personal history of other malignant neoplasm of bronchus and lung: Secondary | ICD-10-CM | POA: Diagnosis not present

## 2023-10-17 DIAGNOSIS — D649 Anemia, unspecified: Secondary | ICD-10-CM | POA: Diagnosis not present

## 2023-10-17 DIAGNOSIS — I10 Essential (primary) hypertension: Secondary | ICD-10-CM | POA: Insufficient documentation

## 2023-10-17 DIAGNOSIS — Z87442 Personal history of urinary calculi: Secondary | ICD-10-CM | POA: Diagnosis not present

## 2023-10-17 DIAGNOSIS — C3491 Malignant neoplasm of unspecified part of right bronchus or lung: Secondary | ICD-10-CM

## 2023-10-17 DIAGNOSIS — R197 Diarrhea, unspecified: Secondary | ICD-10-CM | POA: Insufficient documentation

## 2023-10-17 DIAGNOSIS — C3490 Malignant neoplasm of unspecified part of unspecified bronchus or lung: Secondary | ICD-10-CM

## 2023-10-17 DIAGNOSIS — M810 Age-related osteoporosis without current pathological fracture: Secondary | ICD-10-CM | POA: Insufficient documentation

## 2023-10-17 DIAGNOSIS — M129 Arthropathy, unspecified: Secondary | ICD-10-CM | POA: Diagnosis not present

## 2023-10-17 LAB — CBC WITH DIFFERENTIAL (CANCER CENTER ONLY)
Abs Immature Granulocytes: 0.03 10*3/uL (ref 0.00–0.07)
Basophils Absolute: 0 10*3/uL (ref 0.0–0.1)
Basophils Relative: 1 %
Eosinophils Absolute: 0.2 10*3/uL (ref 0.0–0.5)
Eosinophils Relative: 3 %
HCT: 34.6 % — ABNORMAL LOW (ref 36.0–46.0)
Hemoglobin: 12 g/dL (ref 12.0–15.0)
Immature Granulocytes: 1 %
Lymphocytes Relative: 12 %
Lymphs Abs: 0.8 10*3/uL (ref 0.7–4.0)
MCH: 32.8 pg (ref 26.0–34.0)
MCHC: 34.7 g/dL (ref 30.0–36.0)
MCV: 94.5 fL (ref 80.0–100.0)
Monocytes Absolute: 0.7 10*3/uL (ref 0.1–1.0)
Monocytes Relative: 10 %
Neutro Abs: 4.9 10*3/uL (ref 1.7–7.7)
Neutrophils Relative %: 73 %
Platelet Count: 240 10*3/uL (ref 150–400)
RBC: 3.66 MIL/uL — ABNORMAL LOW (ref 3.87–5.11)
RDW: 13.9 % (ref 11.5–15.5)
WBC Count: 6.6 10*3/uL (ref 4.0–10.5)
nRBC: 0 % (ref 0.0–0.2)

## 2023-10-17 LAB — CMP (CANCER CENTER ONLY)
ALT: 20 U/L (ref 0–44)
AST: 28 U/L (ref 15–41)
Albumin: 3.4 g/dL — ABNORMAL LOW (ref 3.5–5.0)
Alkaline Phosphatase: 42 U/L (ref 38–126)
Anion gap: 9 (ref 5–15)
BUN: 30 mg/dL — ABNORMAL HIGH (ref 8–23)
CO2: 26 mmol/L (ref 22–32)
Calcium: 9 mg/dL (ref 8.9–10.3)
Chloride: 105 mmol/L (ref 98–111)
Creatinine: 0.89 mg/dL (ref 0.44–1.00)
GFR, Estimated: >60 mL/min (ref >60)
Glucose, Bld: 97 mg/dL (ref 70–99)
Potassium: 3.7 mmol/L (ref 3.5–5.1)
Sodium: 140 mmol/L (ref 135–145)
Total Bilirubin: 0.7 mg/dL (ref 0.3–1.2)
Total Protein: 5.9 g/dL — ABNORMAL LOW (ref 6.5–8.1)

## 2023-10-21 DIAGNOSIS — Z87891 Personal history of nicotine dependence: Secondary | ICD-10-CM | POA: Diagnosis not present

## 2023-10-21 DIAGNOSIS — Z85118 Personal history of other malignant neoplasm of bronchus and lung: Secondary | ICD-10-CM | POA: Diagnosis not present

## 2023-10-21 DIAGNOSIS — Z7951 Long term (current) use of inhaled steroids: Secondary | ICD-10-CM | POA: Diagnosis not present

## 2023-10-21 DIAGNOSIS — E440 Moderate protein-calorie malnutrition: Secondary | ICD-10-CM | POA: Diagnosis not present

## 2023-10-21 DIAGNOSIS — M6284 Sarcopenia: Secondary | ICD-10-CM | POA: Diagnosis not present

## 2023-10-21 DIAGNOSIS — K219 Gastro-esophageal reflux disease without esophagitis: Secondary | ICD-10-CM | POA: Diagnosis not present

## 2023-10-21 DIAGNOSIS — Z79891 Long term (current) use of opiate analgesic: Secondary | ICD-10-CM | POA: Diagnosis not present

## 2023-10-21 DIAGNOSIS — I2510 Atherosclerotic heart disease of native coronary artery without angina pectoris: Secondary | ICD-10-CM | POA: Diagnosis not present

## 2023-10-21 DIAGNOSIS — T796XXD Traumatic ischemia of muscle, subsequent encounter: Secondary | ICD-10-CM | POA: Diagnosis not present

## 2023-10-21 DIAGNOSIS — E782 Mixed hyperlipidemia: Secondary | ICD-10-CM | POA: Diagnosis not present

## 2023-10-21 DIAGNOSIS — M810 Age-related osteoporosis without current pathological fracture: Secondary | ICD-10-CM | POA: Diagnosis not present

## 2023-10-21 DIAGNOSIS — J449 Chronic obstructive pulmonary disease, unspecified: Secondary | ICD-10-CM | POA: Diagnosis not present

## 2023-10-21 DIAGNOSIS — M06031 Rheumatoid arthritis without rheumatoid factor, right wrist: Secondary | ICD-10-CM | POA: Diagnosis not present

## 2023-10-23 DIAGNOSIS — L821 Other seborrheic keratosis: Secondary | ICD-10-CM | POA: Diagnosis not present

## 2023-10-23 DIAGNOSIS — Z85828 Personal history of other malignant neoplasm of skin: Secondary | ICD-10-CM | POA: Diagnosis not present

## 2023-10-23 DIAGNOSIS — L570 Actinic keratosis: Secondary | ICD-10-CM | POA: Diagnosis not present

## 2023-10-23 DIAGNOSIS — L308 Other specified dermatitis: Secondary | ICD-10-CM | POA: Diagnosis not present

## 2023-10-23 DIAGNOSIS — D044 Carcinoma in situ of skin of scalp and neck: Secondary | ICD-10-CM | POA: Diagnosis not present

## 2023-10-23 DIAGNOSIS — C44311 Basal cell carcinoma of skin of nose: Secondary | ICD-10-CM | POA: Diagnosis not present

## 2023-10-23 DIAGNOSIS — D045 Carcinoma in situ of skin of trunk: Secondary | ICD-10-CM | POA: Diagnosis not present

## 2023-10-24 DIAGNOSIS — Z85118 Personal history of other malignant neoplasm of bronchus and lung: Secondary | ICD-10-CM | POA: Diagnosis not present

## 2023-10-24 DIAGNOSIS — M6284 Sarcopenia: Secondary | ICD-10-CM | POA: Diagnosis not present

## 2023-10-24 DIAGNOSIS — K219 Gastro-esophageal reflux disease without esophagitis: Secondary | ICD-10-CM | POA: Diagnosis not present

## 2023-10-24 DIAGNOSIS — E782 Mixed hyperlipidemia: Secondary | ICD-10-CM | POA: Diagnosis not present

## 2023-10-24 DIAGNOSIS — Z7951 Long term (current) use of inhaled steroids: Secondary | ICD-10-CM | POA: Diagnosis not present

## 2023-10-24 DIAGNOSIS — M06031 Rheumatoid arthritis without rheumatoid factor, right wrist: Secondary | ICD-10-CM | POA: Diagnosis not present

## 2023-10-24 DIAGNOSIS — I2510 Atherosclerotic heart disease of native coronary artery without angina pectoris: Secondary | ICD-10-CM | POA: Diagnosis not present

## 2023-10-24 DIAGNOSIS — Z79891 Long term (current) use of opiate analgesic: Secondary | ICD-10-CM | POA: Diagnosis not present

## 2023-10-24 DIAGNOSIS — Z87891 Personal history of nicotine dependence: Secondary | ICD-10-CM | POA: Diagnosis not present

## 2023-10-24 DIAGNOSIS — T796XXD Traumatic ischemia of muscle, subsequent encounter: Secondary | ICD-10-CM | POA: Diagnosis not present

## 2023-10-24 DIAGNOSIS — M810 Age-related osteoporosis without current pathological fracture: Secondary | ICD-10-CM | POA: Diagnosis not present

## 2023-10-24 DIAGNOSIS — E440 Moderate protein-calorie malnutrition: Secondary | ICD-10-CM | POA: Diagnosis not present

## 2023-10-24 DIAGNOSIS — J449 Chronic obstructive pulmonary disease, unspecified: Secondary | ICD-10-CM | POA: Diagnosis not present

## 2023-10-29 DIAGNOSIS — Z79891 Long term (current) use of opiate analgesic: Secondary | ICD-10-CM | POA: Diagnosis not present

## 2023-10-29 DIAGNOSIS — T796XXD Traumatic ischemia of muscle, subsequent encounter: Secondary | ICD-10-CM | POA: Diagnosis not present

## 2023-10-29 DIAGNOSIS — Z7951 Long term (current) use of inhaled steroids: Secondary | ICD-10-CM | POA: Diagnosis not present

## 2023-10-29 DIAGNOSIS — Z87891 Personal history of nicotine dependence: Secondary | ICD-10-CM | POA: Diagnosis not present

## 2023-10-29 DIAGNOSIS — E782 Mixed hyperlipidemia: Secondary | ICD-10-CM | POA: Diagnosis not present

## 2023-10-29 DIAGNOSIS — Z85118 Personal history of other malignant neoplasm of bronchus and lung: Secondary | ICD-10-CM | POA: Diagnosis not present

## 2023-10-29 DIAGNOSIS — M6284 Sarcopenia: Secondary | ICD-10-CM | POA: Diagnosis not present

## 2023-10-29 DIAGNOSIS — K219 Gastro-esophageal reflux disease without esophagitis: Secondary | ICD-10-CM | POA: Diagnosis not present

## 2023-10-29 DIAGNOSIS — M06031 Rheumatoid arthritis without rheumatoid factor, right wrist: Secondary | ICD-10-CM | POA: Diagnosis not present

## 2023-10-29 DIAGNOSIS — I2510 Atherosclerotic heart disease of native coronary artery without angina pectoris: Secondary | ICD-10-CM | POA: Diagnosis not present

## 2023-10-29 DIAGNOSIS — J449 Chronic obstructive pulmonary disease, unspecified: Secondary | ICD-10-CM | POA: Diagnosis not present

## 2023-10-29 DIAGNOSIS — E440 Moderate protein-calorie malnutrition: Secondary | ICD-10-CM | POA: Diagnosis not present

## 2023-10-29 DIAGNOSIS — M810 Age-related osteoporosis without current pathological fracture: Secondary | ICD-10-CM | POA: Diagnosis not present

## 2023-10-30 DIAGNOSIS — D414 Neoplasm of uncertain behavior of bladder: Secondary | ICD-10-CM | POA: Diagnosis not present

## 2023-10-30 DIAGNOSIS — N390 Urinary tract infection, site not specified: Secondary | ICD-10-CM | POA: Diagnosis not present

## 2023-10-31 DIAGNOSIS — D045 Carcinoma in situ of skin of trunk: Secondary | ICD-10-CM | POA: Diagnosis not present

## 2023-10-31 DIAGNOSIS — D044 Carcinoma in situ of skin of scalp and neck: Secondary | ICD-10-CM | POA: Diagnosis not present

## 2023-11-01 DIAGNOSIS — I2510 Atherosclerotic heart disease of native coronary artery without angina pectoris: Secondary | ICD-10-CM | POA: Diagnosis not present

## 2023-11-01 DIAGNOSIS — E782 Mixed hyperlipidemia: Secondary | ICD-10-CM | POA: Diagnosis not present

## 2023-11-01 DIAGNOSIS — M6284 Sarcopenia: Secondary | ICD-10-CM | POA: Diagnosis not present

## 2023-11-01 DIAGNOSIS — M06031 Rheumatoid arthritis without rheumatoid factor, right wrist: Secondary | ICD-10-CM | POA: Diagnosis not present

## 2023-11-01 DIAGNOSIS — E440 Moderate protein-calorie malnutrition: Secondary | ICD-10-CM | POA: Diagnosis not present

## 2023-11-01 DIAGNOSIS — Z85118 Personal history of other malignant neoplasm of bronchus and lung: Secondary | ICD-10-CM | POA: Diagnosis not present

## 2023-11-01 DIAGNOSIS — Z87891 Personal history of nicotine dependence: Secondary | ICD-10-CM | POA: Diagnosis not present

## 2023-11-01 DIAGNOSIS — Z7951 Long term (current) use of inhaled steroids: Secondary | ICD-10-CM | POA: Diagnosis not present

## 2023-11-01 DIAGNOSIS — M810 Age-related osteoporosis without current pathological fracture: Secondary | ICD-10-CM | POA: Diagnosis not present

## 2023-11-01 DIAGNOSIS — J449 Chronic obstructive pulmonary disease, unspecified: Secondary | ICD-10-CM | POA: Diagnosis not present

## 2023-11-01 DIAGNOSIS — T796XXD Traumatic ischemia of muscle, subsequent encounter: Secondary | ICD-10-CM | POA: Diagnosis not present

## 2023-11-01 DIAGNOSIS — K219 Gastro-esophageal reflux disease without esophagitis: Secondary | ICD-10-CM | POA: Diagnosis not present

## 2023-11-01 DIAGNOSIS — Z79891 Long term (current) use of opiate analgesic: Secondary | ICD-10-CM | POA: Diagnosis not present

## 2023-11-05 ENCOUNTER — Other Ambulatory Visit: Payer: Self-pay | Admitting: Urology

## 2023-11-12 ENCOUNTER — Other Ambulatory Visit: Payer: Self-pay

## 2023-11-13 DIAGNOSIS — M06031 Rheumatoid arthritis without rheumatoid factor, right wrist: Secondary | ICD-10-CM | POA: Diagnosis not present

## 2023-11-13 DIAGNOSIS — E440 Moderate protein-calorie malnutrition: Secondary | ICD-10-CM | POA: Diagnosis not present

## 2023-11-13 DIAGNOSIS — I2510 Atherosclerotic heart disease of native coronary artery without angina pectoris: Secondary | ICD-10-CM | POA: Diagnosis not present

## 2023-11-13 DIAGNOSIS — M6284 Sarcopenia: Secondary | ICD-10-CM | POA: Diagnosis not present

## 2023-11-13 DIAGNOSIS — Z87891 Personal history of nicotine dependence: Secondary | ICD-10-CM | POA: Diagnosis not present

## 2023-11-13 DIAGNOSIS — Z85118 Personal history of other malignant neoplasm of bronchus and lung: Secondary | ICD-10-CM | POA: Diagnosis not present

## 2023-11-13 DIAGNOSIS — J449 Chronic obstructive pulmonary disease, unspecified: Secondary | ICD-10-CM | POA: Diagnosis not present

## 2023-11-13 DIAGNOSIS — Z7951 Long term (current) use of inhaled steroids: Secondary | ICD-10-CM | POA: Diagnosis not present

## 2023-11-13 DIAGNOSIS — K219 Gastro-esophageal reflux disease without esophagitis: Secondary | ICD-10-CM | POA: Diagnosis not present

## 2023-11-13 DIAGNOSIS — M810 Age-related osteoporosis without current pathological fracture: Secondary | ICD-10-CM | POA: Diagnosis not present

## 2023-11-13 DIAGNOSIS — E782 Mixed hyperlipidemia: Secondary | ICD-10-CM | POA: Diagnosis not present

## 2023-11-13 DIAGNOSIS — T796XXD Traumatic ischemia of muscle, subsequent encounter: Secondary | ICD-10-CM | POA: Diagnosis not present

## 2023-11-13 DIAGNOSIS — Z79891 Long term (current) use of opiate analgesic: Secondary | ICD-10-CM | POA: Diagnosis not present

## 2023-11-13 NOTE — Patient Instructions (Signed)
SURGICAL WAITING ROOM VISITATION  Patients having surgery or a procedure may have no more than 2 support people in the waiting area - these visitors may rotate.    Children under the age of 56 must have an adult with them who is not the patient.  Due to an increase in RSV and influenza rates and associated hospitalizations, children ages 37 and under may not visit patients in [facility] hospitals.  If the patient needs to stay at the hospital during part of their recovery, the visitor guidelines for inpatient rooms apply. Pre-op nurse will coordinate an appropriate time for 1 support person to accompany patient in pre-op.  This support person may not rotate.    Please refer to the [facility] website for the visitor guidelines for Inpatients (after your surgery is over and you are in a regular room).       Your procedure is scheduled on:  11/28/2023    Report to [facility] Main Entrance    Report to admitting at   0900AM   Call this number if you have problems the morning of surgery [phone]   Do not eat food : or drink liquids After Midnight.                         Oral Hygiene is also important to reduce your risk of infection.                                    Remember - BRUSH YOUR TEETH THE MORNING OF SURGERY WITH YOUR REGULAR TOOTHPASTE  DENTURES WILL BE REMOVED PRIOR TO SURGERY PLEASE DO NOT APPLY "Poly grip" OR ADHESIVES!!!   Do NOT smoke after Midnight   Stop all vitamins and herbal supplements 7 days before surgery.   Take these medicines the morning of surgery with A SIP OF WATER:  inhalers as usual and bring   DO NOT TAKE ANY ORAL DIABETIC MEDICATIONS DAY OF YOUR SURGERY  Bring CPAP mask and tubing day of surgery.                              You may not have any metal on your body including hair pins, jewelry, and body piercing             Do not wear make-up, lotions, powders, perfumes/cologne, or deodorant  Do not wear nail polish  including gel and S&S, artificial/acrylic nails, or any other type of covering on natural nails including finger and toenails. If you have artificial nails, gel coating, etc. that needs to be removed by a nail salon please have this removed prior to surgery or surgery may need to be canceled/ delayed if the surgeon/ anesthesia feels like they are unable to be safely monitored.   Do not shave  48 hours prior to surgery.               Men may shave face and neck.   Do not bring valuables to the hospital. [facility] IS NOT             RESPONSIBLE   FOR VALUABLES.   Contacts, glasses, dentures or bridgework may not be worn into surgery.   Bring small overnight bag day of surgery.   DO NOT BRING YOUR HOME MEDICATIONS TO THE HOSPITAL. PHARMACY WILL DISPENSE MEDICATIONS  LISTED ON YOUR MEDICATION LIST TO YOU DURING YOUR ADMISSION IN THE HOSPITAL!    Patients discharged on the day of surgery will not be allowed to drive home.  Someone NEEDS to stay with you for the first 24 hours after anesthesia.   Special Instructions: Bring a copy of your healthcare power of attorney and living will documents the day of surgery if you haven't scanned them before.              Please read over the following fact sheets you were given: IF YOU HAVE QUESTIONS ABOUT YOUR PRE-OP INSTRUCTIONS PLEASE CALL [phone]   If you received a COVID test during your pre-op visit  it is requested that you wear a mask when out in public, stay away from anyone that may not be feeling well and notify your surgeon if you develop symptoms. If you test positive for Covid or have been in contact with anyone that has tested positive in the last 10 days please notify you surgeon.    [place] - Preparing for Surgery Before surgery, you can play an important role.  Because skin is not sterile, your skin needs to be as free of germs as possible.  You can reduce the number of germs on your skin by washing with CHG (chlorahexidine  gluconate) soap before surgery.  CHG is an antiseptic cleaner which kills germs and bonds with the skin to continue killing germs even after washing. Please DO NOT use if you have an allergy to CHG or antibacterial soaps.  If your skin becomes reddened/irritated stop using the CHG and inform your nurse when you arrive at Short Stay. Do not shave (including legs and underarms) for at least 48 hours prior to the first CHG shower.  You may shave your face/neck. Please follow these instructions carefully:  1.  Shower with CHG Soap the night before surgery and the  morning of Surgery.  2.  If you choose to wash your hair, wash your hair first as usual with your  normal  shampoo.  3.  After you shampoo, rinse your hair and body thoroughly to remove the  shampoo.                           4.  Use CHG as you would any other liquid soap.  You can apply chg directly  to the skin and wash                       Gently with a scrungie or clean washcloth.  5.  Apply the CHG Soap to your body ONLY FROM THE NECK DOWN.   Do not use on face/ open                           Wound or open sores. Avoid contact with eyes, ears mouth and genitals (private parts).                       Wash face,  Genitals (private parts) with your normal soap.             6.  Wash thoroughly, paying special attention to the area where your surgery  will be performed.  7.  Thoroughly rinse your body with warm water from the neck down.  8.  DO NOT shower/wash with your normal soap after using and rinsing off  the CHG Soap.                9.  Pat yourself dry with a clean towel.            10.  Wear clean pajamas.            11.  Place clean sheets on your bed the night of your first shower and do not  sleep with pets. Day of Surgery : Do not apply any lotions/deodorants the morning of surgery.  Please wear clean clothes to the hospital/surgery center.  FAILURE TO FOLLOW THESE INSTRUCTIONS MAY RESULT IN THE CANCELLATION OF YOUR  SURGERY PATIENT SIGNATURE_________________________________  NURSE SIGNATURE__________________________________  ________________________________________________________________________

## 2023-11-13 NOTE — Progress Notes (Addendum)
Anesthesia Review:  PCP: Gweneth Dimitri  Cardiologist : none Oncology- DR Mohammed  Chest x-ray : 08/24/23- 1 view  CT Chest- 07/17/23  EKG : 08/24/23  Echo : 2021  Stress test: 2021  PFT- 2019  Cardiac Cath :  Activity level: sob with exertion hx of lung ca uses walker  Sleep Study/ CPAP : none  Fasting Blood Sugar :      / Checks Blood Sugar -- times a day:   Blood Thinner/ Instructions /Last Dose: ASA / Instructions/ Last Dose :    ON Tarceva for lung ca  Right knee brace to obtain prior to surgery per pt

## 2023-11-14 ENCOUNTER — Other Ambulatory Visit: Payer: Self-pay

## 2023-11-14 NOTE — Progress Notes (Signed)
Specialty Pharmacy Refill Coordination Note  Dana [name] is a 88 y.o. female contacted today regarding refills of specialty medication(s) Erlotinib Hcl   Patient requested Delivery   Delivery date: 11/19/23   Verified address: [address]   [name] [id number]   Medication will be filled on 11/18/23.

## 2023-11-18 ENCOUNTER — Other Ambulatory Visit: Payer: Self-pay

## 2023-11-18 ENCOUNTER — Encounter (HOSPITAL_COMMUNITY): Payer: Self-pay

## 2023-11-18 ENCOUNTER — Encounter (HOSPITAL_COMMUNITY)
Admission: RE | Admit: 2023-11-18 | Discharge: 2023-11-18 | Disposition: A | Discharge status: Home / Self Care | Payer: Medicare Other | Source: Ambulatory Visit | Attending: Urology | Admitting: Urology

## 2023-11-18 VITALS — BP 143/93 | HR 112 | Temp 98.3°F | Resp 16 | Ht 64.0 in

## 2023-11-18 DIAGNOSIS — Z01812 Encounter for preprocedural laboratory examination: Secondary | ICD-10-CM | POA: Insufficient documentation

## 2023-11-18 DIAGNOSIS — Z01818 Encounter for other preprocedural examination: Secondary | ICD-10-CM

## 2023-11-18 DIAGNOSIS — Z9221 Personal history of antineoplastic chemotherapy: Secondary | ICD-10-CM | POA: Diagnosis not present

## 2023-11-18 DIAGNOSIS — Z859 Personal history of malignant neoplasm, unspecified: Secondary | ICD-10-CM | POA: Insufficient documentation

## 2023-11-18 HISTORY — DX: Other specified postprocedural states: Z98.890

## 2023-11-18 HISTORY — DX: Other specified postprocedural states: R11.2

## 2023-11-18 LAB — BASIC METABOLIC PANEL
Anion gap: 8 (ref 5–15)
BUN: 30 mg/dL — ABNORMAL HIGH (ref 8–23)
CO2: 25 mmol/L (ref 22–32)
Calcium: 9.1 mg/dL (ref 8.9–10.3)
Chloride: 108 mmol/L (ref 98–111)
Creatinine, Ser: 0.92 mg/dL (ref 0.44–1.00)
GFR, Estimated: >60 mL/min (ref >60)
Glucose, Bld: 100 mg/dL — ABNORMAL HIGH (ref 70–99)
Potassium: 4.1 mmol/L (ref 3.5–5.1)
Sodium: 141 mmol/L (ref 135–145)

## 2023-11-18 LAB — CBC
HCT: 38.6 % (ref 36.0–46.0)
Hemoglobin: 13 g/dL (ref 12.0–15.0)
MCH: 31.9 pg (ref 26.0–34.0)
MCHC: 33.7 g/dL (ref 30.0–36.0)
MCV: 94.8 fL (ref 80.0–100.0)
Platelets: 239 10*3/uL (ref 150–400)
RBC: 4.07 MIL/uL (ref 3.87–5.11)
RDW: 14.8 % (ref 11.5–15.5)
WBC: 7.2 10*3/uL (ref 4.0–10.5)
nRBC: 0 % (ref 0.0–0.2)

## 2023-11-19 ENCOUNTER — Encounter (HOSPITAL_COMMUNITY): Payer: Self-pay | Admitting: Physician Assistant

## 2023-11-24 ENCOUNTER — Encounter (HOSPITAL_COMMUNITY): Payer: Self-pay

## 2023-11-24 ENCOUNTER — Emergency Department (HOSPITAL_COMMUNITY): Payer: Medicare Other

## 2023-11-24 ENCOUNTER — Other Ambulatory Visit: Payer: Self-pay

## 2023-11-24 ENCOUNTER — Inpatient Hospital Stay (HOSPITAL_COMMUNITY)
Admission: EM | Admit: 2023-11-24 | Discharge: 2023-11-27 | DRG: 871 | Disposition: A | Discharge status: Home Health | Payer: Medicare Other | Attending: Family Medicine | Admitting: Family Medicine

## 2023-11-24 DIAGNOSIS — E441 Mild protein-calorie malnutrition: Secondary | ICD-10-CM | POA: Diagnosis not present

## 2023-11-24 DIAGNOSIS — J9601 Acute respiratory failure with hypoxia: Secondary | ICD-10-CM | POA: Diagnosis present

## 2023-11-24 DIAGNOSIS — I5032 Chronic diastolic (congestive) heart failure: Secondary | ICD-10-CM | POA: Diagnosis present

## 2023-11-24 DIAGNOSIS — Z7901 Long term (current) use of anticoagulants: Secondary | ICD-10-CM | POA: Diagnosis not present

## 2023-11-24 DIAGNOSIS — A319 Mycobacterial infection, unspecified: Secondary | ICD-10-CM | POA: Insufficient documentation

## 2023-11-24 DIAGNOSIS — R6889 Other general symptoms and signs: Secondary | ICD-10-CM | POA: Diagnosis not present

## 2023-11-24 DIAGNOSIS — I10 Essential (primary) hypertension: Secondary | ICD-10-CM | POA: Diagnosis present

## 2023-11-24 DIAGNOSIS — M3500 Sicca syndrome, unspecified: Secondary | ICD-10-CM | POA: Insufficient documentation

## 2023-11-24 DIAGNOSIS — I2699 Other pulmonary embolism without acute cor pulmonale: Secondary | ICD-10-CM | POA: Diagnosis not present

## 2023-11-24 DIAGNOSIS — Z87891 Personal history of nicotine dependence: Secondary | ICD-10-CM | POA: Diagnosis not present

## 2023-11-24 DIAGNOSIS — E876 Hypokalemia: Secondary | ICD-10-CM | POA: Diagnosis not present

## 2023-11-24 DIAGNOSIS — C3491 Malignant neoplasm of unspecified part of right bronchus or lung: Secondary | ICD-10-CM | POA: Diagnosis present

## 2023-11-24 DIAGNOSIS — J069 Acute upper respiratory infection, unspecified: Secondary | ICD-10-CM | POA: Diagnosis present

## 2023-11-24 DIAGNOSIS — R0689 Other abnormalities of breathing: Secondary | ICD-10-CM | POA: Diagnosis not present

## 2023-11-24 DIAGNOSIS — I5030 Unspecified diastolic (congestive) heart failure: Secondary | ICD-10-CM | POA: Diagnosis present

## 2023-11-24 DIAGNOSIS — R7989 Other specified abnormal findings of blood chemistry: Secondary | ICD-10-CM | POA: Diagnosis present

## 2023-11-24 DIAGNOSIS — I2609 Other pulmonary embolism with acute cor pulmonale: Secondary | ICD-10-CM | POA: Diagnosis not present

## 2023-11-24 DIAGNOSIS — Z881 Allergy status to other antibiotic agents status: Secondary | ICD-10-CM

## 2023-11-24 DIAGNOSIS — I3139 Other pericardial effusion (noninflammatory): Secondary | ICD-10-CM | POA: Diagnosis not present

## 2023-11-24 DIAGNOSIS — R110 Nausea: Secondary | ICD-10-CM | POA: Diagnosis not present

## 2023-11-24 DIAGNOSIS — J440 Chronic obstructive pulmonary disease with acute lower respiratory infection: Secondary | ICD-10-CM | POA: Diagnosis present

## 2023-11-24 DIAGNOSIS — I110 Hypertensive heart disease with heart failure: Secondary | ICD-10-CM | POA: Diagnosis not present

## 2023-11-24 DIAGNOSIS — J189 Pneumonia, unspecified organism: Secondary | ICD-10-CM | POA: Diagnosis present

## 2023-11-24 DIAGNOSIS — L89899 Pressure ulcer of other site, unspecified stage: Secondary | ICD-10-CM | POA: Diagnosis present

## 2023-11-24 DIAGNOSIS — Z681 Body mass index (BMI) 19 or less, adult: Secondary | ICD-10-CM | POA: Diagnosis not present

## 2023-11-24 DIAGNOSIS — F32A Depression, unspecified: Secondary | ICD-10-CM | POA: Diagnosis present

## 2023-11-24 DIAGNOSIS — Z9221 Personal history of antineoplastic chemotherapy: Secondary | ICD-10-CM

## 2023-11-24 DIAGNOSIS — Z880 Allergy status to penicillin: Secondary | ICD-10-CM

## 2023-11-24 DIAGNOSIS — Z923 Personal history of irradiation: Secondary | ICD-10-CM | POA: Diagnosis not present

## 2023-11-24 DIAGNOSIS — R0902 Hypoxemia: Secondary | ICD-10-CM | POA: Diagnosis not present

## 2023-11-24 DIAGNOSIS — M810 Age-related osteoporosis without current pathological fracture: Secondary | ICD-10-CM | POA: Diagnosis present

## 2023-11-24 DIAGNOSIS — Z86711 Personal history of pulmonary embolism: Secondary | ICD-10-CM | POA: Diagnosis not present

## 2023-11-24 DIAGNOSIS — Z66 Do not resuscitate: Secondary | ICD-10-CM | POA: Diagnosis present

## 2023-11-24 DIAGNOSIS — R0602 Shortness of breath: Secondary | ICD-10-CM | POA: Diagnosis not present

## 2023-11-24 DIAGNOSIS — Z1152 Encounter for screening for COVID-19: Secondary | ICD-10-CM | POA: Diagnosis not present

## 2023-11-24 DIAGNOSIS — A419 Sepsis, unspecified organism: Principal | ICD-10-CM | POA: Diagnosis present

## 2023-11-24 DIAGNOSIS — Z79899 Other long term (current) drug therapy: Secondary | ICD-10-CM

## 2023-11-24 DIAGNOSIS — M0630 Rheumatoid nodule, unspecified site: Secondary | ICD-10-CM | POA: Insufficient documentation

## 2023-11-24 DIAGNOSIS — J929 Pleural plaque without asbestos: Secondary | ICD-10-CM | POA: Diagnosis not present

## 2023-11-24 DIAGNOSIS — R918 Other nonspecific abnormal finding of lung field: Secondary | ICD-10-CM | POA: Diagnosis not present

## 2023-11-24 DIAGNOSIS — I2489 Other forms of acute ischemic heart disease: Secondary | ICD-10-CM | POA: Diagnosis present

## 2023-11-24 DIAGNOSIS — M069 Rheumatoid arthritis, unspecified: Secondary | ICD-10-CM | POA: Diagnosis not present

## 2023-11-24 DIAGNOSIS — I499 Cardiac arrhythmia, unspecified: Secondary | ICD-10-CM | POA: Diagnosis not present

## 2023-11-24 DIAGNOSIS — J449 Chronic obstructive pulmonary disease, unspecified: Secondary | ICD-10-CM | POA: Diagnosis present

## 2023-11-24 DIAGNOSIS — R6883 Chills (without fever): Secondary | ICD-10-CM | POA: Diagnosis not present

## 2023-11-24 DIAGNOSIS — E739 Lactose intolerance, unspecified: Secondary | ICD-10-CM | POA: Diagnosis present

## 2023-11-24 DIAGNOSIS — Z90710 Acquired absence of both cervix and uterus: Secondary | ICD-10-CM

## 2023-11-24 DIAGNOSIS — K219 Gastro-esophageal reflux disease without esophagitis: Secondary | ICD-10-CM | POA: Diagnosis not present

## 2023-11-24 DIAGNOSIS — Z90722 Acquired absence of ovaries, bilateral: Secondary | ICD-10-CM

## 2023-11-24 DIAGNOSIS — J90 Pleural effusion, not elsewhere classified: Secondary | ICD-10-CM | POA: Diagnosis not present

## 2023-11-24 DIAGNOSIS — Z7951 Long term (current) use of inhaled steroids: Secondary | ICD-10-CM

## 2023-11-24 HISTORY — DX: Unspecified diastolic (congestive) heart failure: I50.30

## 2023-11-24 LAB — CBC WITH DIFFERENTIAL/PLATELET
Abs Immature Granulocytes: 0.05 10*3/uL (ref 0.00–0.07)
Basophils Absolute: 0 10*3/uL (ref 0.0–0.1)
Basophils Relative: 0 %
Eosinophils Absolute: 0 10*3/uL (ref 0.0–0.5)
Eosinophils Relative: 0 %
HCT: 37.8 % (ref 36.0–46.0)
Hemoglobin: 12.3 g/dL (ref 12.0–15.0)
Immature Granulocytes: 1 %
Lymphocytes Relative: 2 %
Lymphs Abs: 0.2 10*3/uL — ABNORMAL LOW (ref 0.7–4.0)
MCH: 30.9 pg (ref 26.0–34.0)
MCHC: 32.5 g/dL (ref 30.0–36.0)
MCV: 95 fL (ref 80.0–100.0)
Monocytes Absolute: 0.6 10*3/uL (ref 0.1–1.0)
Monocytes Relative: 6 %
Neutro Abs: 9.8 10*3/uL — ABNORMAL HIGH (ref 1.7–7.7)
Neutrophils Relative %: 91 %
Platelets: 237 10*3/uL (ref 150–400)
RBC: 3.98 MIL/uL (ref 3.87–5.11)
RDW: 15 % (ref 11.5–15.5)
WBC: 10.7 10*3/uL — ABNORMAL HIGH (ref 4.0–10.5)
nRBC: 0 % (ref 0.0–0.2)

## 2023-11-24 LAB — COMPREHENSIVE METABOLIC PANEL
ALT: 25 U/L (ref 0–44)
AST: 27 U/L (ref 15–41)
Albumin: 3.2 g/dL — ABNORMAL LOW (ref 3.5–5.0)
Alkaline Phosphatase: 41 U/L (ref 38–126)
Anion gap: 9 (ref 5–15)
BUN: 24 mg/dL — ABNORMAL HIGH (ref 8–23)
CO2: 24 mmol/L (ref 22–32)
Calcium: 8 mg/dL — ABNORMAL LOW (ref 8.9–10.3)
Chloride: 106 mmol/L (ref 98–111)
Creatinine, Ser: 0.8 mg/dL (ref 0.44–1.00)
GFR, Estimated: >60 mL/min (ref >60)
Glucose, Bld: 136 mg/dL — ABNORMAL HIGH (ref 70–99)
Potassium: 3.3 mmol/L — ABNORMAL LOW (ref 3.5–5.1)
Sodium: 139 mmol/L (ref 135–145)
Total Bilirubin: 1 mg/dL (ref <1.2)
Total Protein: 5.6 g/dL — ABNORMAL LOW (ref 6.5–8.1)

## 2023-11-24 LAB — PHOSPHORUS: Phosphorus: 3.2 mg/dL (ref 2.5–4.6)

## 2023-11-24 LAB — MRSA NEXT GEN BY PCR, NASAL: MRSA by PCR Next Gen: NOT DETECTED

## 2023-11-24 LAB — TROPONIN I (HIGH SENSITIVITY)
Troponin I (High Sensitivity): 41 ng/L — ABNORMAL HIGH (ref <18)
Troponin I (High Sensitivity): 42 ng/L — ABNORMAL HIGH (ref <18)

## 2023-11-24 LAB — RESP PANEL BY RT-PCR (RSV, FLU A&B, COVID)  RVPGX2
Influenza A by PCR: NEGATIVE
Influenza B by PCR: NEGATIVE
Resp Syncytial Virus by PCR: NEGATIVE
SARS Coronavirus 2 by RT PCR: NEGATIVE

## 2023-11-24 LAB — APTT: aPTT: 22 s — ABNORMAL LOW (ref 24–36)

## 2023-11-24 LAB — PROTIME-INR
INR: 1.2 (ref 0.8–1.2)
Prothrombin Time: 15 s (ref 11.4–15.2)

## 2023-11-24 LAB — BRAIN NATRIURETIC PEPTIDE: B Natriuretic Peptide: 163.6 pg/mL — ABNORMAL HIGH (ref 0.0–100.0)

## 2023-11-24 LAB — MAGNESIUM: Magnesium: 1.5 mg/dL — ABNORMAL LOW (ref 1.7–2.4)

## 2023-11-24 MED ORDER — UMECLIDINIUM BROMIDE 62.5 MCG/ACT IN AEPB
1.0000 | INHALATION_SPRAY | Freq: Every day | RESPIRATORY_TRACT | Status: DC
  Administered 2023-11-25 – 2023-11-27 (×3): 1 via RESPIRATORY_TRACT
  Filled 2023-11-24: qty 7

## 2023-11-24 MED ORDER — SODIUM CHLORIDE 0.9 % IV SOLN
500.0000 mg | INTRAVENOUS | Status: DC
  Administered 2023-11-25: 500 mg via INTRAVENOUS
  Filled 2023-11-24 (×2): qty 5

## 2023-11-24 MED ORDER — ORAL CARE MOUTH RINSE: 15.0000 mL | OROMUCOSAL | Status: DC | PRN

## 2023-11-24 MED ORDER — ACETAMINOPHEN 325 MG PO TABS
650.0000 mg | ORAL_TABLET | Freq: Four times a day (QID) | ORAL | Status: DC | PRN
  Administered 2023-11-25 – 2023-11-27 (×4): 650 mg via ORAL
  Filled 2023-11-24 (×4): qty 2

## 2023-11-24 MED ORDER — ONDANSETRON HCL 4 MG PO TABS: 4.0000 mg | ORAL_TABLET | Freq: Four times a day (QID) | ORAL | Status: DC | PRN

## 2023-11-24 MED ORDER — IOHEXOL 350 MG/ML SOLN
75.0000 mL | Freq: Once | INTRAVENOUS | Status: AC | PRN
  Administered 2023-11-24: 75 mL via INTRAVENOUS

## 2023-11-24 MED ORDER — SODIUM CHLORIDE 0.9 % IV SOLN
1.0000 g | INTRAVENOUS | Status: DC
  Administered 2023-11-25 – 2023-11-26 (×2): 1 g via INTRAVENOUS
  Filled 2023-11-24 (×2): qty 10

## 2023-11-24 MED ORDER — ONDANSETRON HCL 4 MG/2ML IJ SOLN: 4.0000 mg | Freq: Four times a day (QID) | INTRAMUSCULAR | Status: DC | PRN

## 2023-11-24 MED ORDER — ALBUTEROL SULFATE (2.5 MG/3ML) 0.083% IN NEBU: 2.5000 mg | INHALATION_SOLUTION | RESPIRATORY_TRACT | Status: DC | PRN

## 2023-11-24 MED ORDER — ARFORMOTEROL TARTRATE 15 MCG/2ML IN NEBU
15.0000 ug | INHALATION_SOLUTION | Freq: Two times a day (BID) | RESPIRATORY_TRACT | Status: DC
  Administered 2023-11-25 – 2023-11-27 (×5): 15 ug via RESPIRATORY_TRACT
  Filled 2023-11-24 (×5): qty 2

## 2023-11-24 MED ORDER — MAGNESIUM SULFATE 2 GM/50ML IV SOLN
2.0000 g | Freq: Once | INTRAVENOUS | Status: AC
  Administered 2023-11-24: 2 g via INTRAVENOUS
  Filled 2023-11-24: qty 50

## 2023-11-24 MED ORDER — HEPARIN (PORCINE) 25000 UT/250ML-% IV SOLN
750.0000 [IU]/h | INTRAVENOUS | Status: AC
  Administered 2023-11-24: 750 [IU]/h via INTRAVENOUS
  Filled 2023-11-24: qty 250

## 2023-11-24 MED ORDER — CALCIUM CARBONATE 1250 (500 CA) MG PO TABS
1.0000 | ORAL_TABLET | Freq: Two times a day (BID) | ORAL | Status: DC
  Administered 2023-11-24 – 2023-11-27 (×6): 1250 mg via ORAL
  Filled 2023-11-24 (×6): qty 1

## 2023-11-24 MED ORDER — SODIUM CHLORIDE 0.9 % IV BOLUS
500.0000 mL | Freq: Once | INTRAVENOUS | Status: AC
  Administered 2023-11-24: 500 mL via INTRAVENOUS

## 2023-11-24 MED ORDER — ACETAMINOPHEN 650 MG RE SUPP: 650.0000 mg | Freq: Four times a day (QID) | RECTAL | Status: DC | PRN

## 2023-11-24 MED ORDER — MIRTAZAPINE 15 MG PO TABS
7.5000 mg | ORAL_TABLET | Freq: Every day | ORAL | Status: DC
  Administered 2023-11-24 – 2023-11-26 (×3): 7.5 mg via ORAL
  Filled 2023-11-24 (×3): qty 1

## 2023-11-24 MED ORDER — VITAMIN B-12 1000 MCG PO TABS
1000.0000 ug | ORAL_TABLET | Freq: Every day | ORAL | Status: DC
  Administered 2023-11-24 – 2023-11-27 (×4): 1000 ug via ORAL
  Filled 2023-11-24 (×4): qty 1

## 2023-11-24 MED ORDER — AZITHROMYCIN 500 MG IV SOLR
500.0000 mg | Freq: Once | INTRAVENOUS | Status: AC
  Administered 2023-11-24: 500 mg via INTRAVENOUS
  Filled 2023-11-24: qty 5

## 2023-11-24 MED ORDER — CHLORHEXIDINE GLUCONATE CLOTH 2 % EX PADS
6.0000 | MEDICATED_PAD | Freq: Every day | CUTANEOUS | Status: DC
  Administered 2023-11-24 – 2023-11-27 (×3): 6 via TOPICAL

## 2023-11-24 MED ORDER — ALBUTEROL SULFATE (2.5 MG/3ML) 0.083% IN NEBU
2.5000 mg | INHALATION_SOLUTION | Freq: Two times a day (BID) | RESPIRATORY_TRACT | Status: DC
  Administered 2023-11-24 – 2023-11-27 (×6): 2.5 mg via RESPIRATORY_TRACT
  Filled 2023-11-24 (×6): qty 3

## 2023-11-24 MED ORDER — METHYLPREDNISOLONE SODIUM SUCC 40 MG IJ SOLR
40.0000 mg | Freq: Once | INTRAMUSCULAR | Status: AC
  Administered 2023-11-24: 40 mg via INTRAVENOUS
  Filled 2023-11-24: qty 1

## 2023-11-24 MED ORDER — IPRATROPIUM-ALBUTEROL 0.5-2.5 (3) MG/3ML IN SOLN: 3.0000 mL | RESPIRATORY_TRACT | Status: DC | PRN

## 2023-11-24 MED ORDER — LACTASE 3000 UNITS PO TABS: 3000.0000 [IU] | ORAL_TABLET | Freq: Every day | ORAL | Status: DC | PRN

## 2023-11-24 MED ORDER — HEPARIN BOLUS VIA INFUSION
1450.0000 [IU] | INTRAVENOUS | Status: AC
  Administered 2023-11-24: 1450 [IU] via INTRAVENOUS
  Filled 2023-11-24: qty 1450

## 2023-11-24 MED ORDER — VITAMIN D 25 MCG (1000 UNIT) PO TABS
2000.0000 [IU] | ORAL_TABLET | Freq: Every day | ORAL | Status: DC
  Administered 2023-11-24 – 2023-11-27 (×4): 2000 [IU] via ORAL
  Filled 2023-11-24 (×4): qty 2

## 2023-11-24 MED ORDER — SODIUM CHLORIDE 0.9 % IV SOLN
1.0000 g | Freq: Once | INTRAVENOUS | Status: AC
Start: 2023-11-24 — End: 2023-11-24
  Administered 2023-11-24: 1 g via INTRAVENOUS
  Filled 2023-11-24: qty 10

## 2023-11-24 MED ORDER — POTASSIUM CHLORIDE CRYS ER 20 MEQ PO TBCR
40.0000 meq | EXTENDED_RELEASE_TABLET | Freq: Once | ORAL | Status: AC
  Administered 2023-11-24: 40 meq via ORAL
  Filled 2023-11-24: qty 2

## 2023-11-24 NOTE — ED Notes (Signed)
ED TO INPATIENT HANDOFF REPORT  ED Nurse Name and Phone #: Gerald [phone]  S Name/Age/Gender Dana [name] 88 y.o. female Room/Bed: WA24/WA24  Code Status   Code Status: Do not attempt resuscitation (DNR) PRE-ARREST INTERVENTIONS DESIRED  Home/SNF/Other Home Patient oriented to: self, place, time, and situation Is this baseline? Yes   Triage Complete: Triage complete  Chief Complaint Acute respiratory failure with hypoxia (HCC) [J96.01]  Triage Note Pt BIBA EMS for shortness of breath, congestion, body aches and chills x 3 days  Pt denies fever, nausea, vomiting, diarrhea  and pain  Pt is A&O x 3 and in no obvious distress at this time.    Allergies Allergies  Allergen Reactions   Methotrexate Shortness Of Breath   Clindamycin Hcl Diarrhea and Other (See Comments)   Lactose Intolerance (Gi)     Upset stomach   Amoxicillin Diarrhea    Hx of C Diff    Protonix [Pantoprazole Sodium] Rash    Level of Care/Admitting Diagnosis ED Disposition     ED Disposition  Admit   Condition  --   Comment  Hospital Area: [facility] [[id number]]  Level of Care: Stepdown [14]  Admit to SDU based on following criteria: Respiratory Distress:  Frequent assessment and/or intervention to maintain adequate ventilation/respiration, pulmonary toilet, and respiratory treatment.  May admit patient to [facility] or [facility] if equivalent level of care is available:: No  Covid Evaluation: Asymptomatic - no recent exposure (last 10 days) testing not required  Diagnosis: Acute respiratory failure with hypoxia [facility]) [[id number]]  Admitting Physician: Bobette Mo [[id number]]  Attending Physician: Bobette Mo [[id number]]  Certification:: I certify this patient will need inpatient services for at least 2 midnights  Expected Medical Readiness: 11/26/2023          B Medical/Surgery History Past Medical History:  Diagnosis Date   Anemia    Arthritis     HANDS,  WRISTS   CAP (community acquired pneumonia) 12/25/2022   COPD GOLD 0/ lama/laba responsive 07/11/2019   Quit smoking 1979  07/05/2016  Walked RA x 3 laps @ 185 ft each stopped due to  End of study, nl pace no desat or sob   - Spirometry 07/05/2016  wnl p am spiriva   - CTa chest 07/05/2016 neg pe/ neg ILD, R infrahilar density c/w lung ca vs post RT scar    Chest CT w contrast  09/12/18  Stable CT of the chest  - 11/25/2018   Walked RA  2 laps @ 217ft each @ moderate pace  stopped due to  desats to 86% c   Depression 04/04/2017   Dyspnea    Dyspnea on exertion    Encounter for therapeutic drug monitoring 10/01/2016   GERD (gastroesophageal reflux disease)    at times   Heart failure with preserved left ventricular function (HFpEF) (HCC) 11/24/2023   Hemorrhoid    History of anal fissures    History of kidney stones    History of lung cancer ONCOLOGIST--  DR Arbutus Ped--  LAST CT ,  NO RECURRENCE OR METS   DX JAN 2010 --  STAGE IIIA  NON-SMALL CELL ADENOCARCINOMA (RIGHT MIDDLE LOBE)---  S/P  CHEMORADIATIO THERAPY  (COMPLETE 03-21-2009)   HTN (hypertension) 10/10/2017   Imbalance 06/04/2017   lung ca dx'd 2010   Normal cardiac stress test    2007  PER PT   Osteoporosis    PONV (postoperative nausea and vomiting)    Rash, skin  RIGHT FOREARM/ HAND   Right wrist pain    MASS   Wears glasses    Past Surgical History:  Procedure Laterality Date   ANAL FISSURE REPAIR  07/09/2011   INTERNAL SPHINCTEROTOMY   BENIGN EXCISION LEFT BREAST CENTRAL DUCT  02/1999   BREAST BIOPSY  01/31/2009   benign   BREAST EXCISIONAL BIOPSY Left 2004   benign   CHEST TUBE INSERTION Right 07/14/2014   Procedure: INSERTION PLEURAL DRAINAGE CATHETER RIGHT CHEST;  Surgeon: Delight Ovens, MD;  Location: [facility] OR;  Service: Thoracic;  Laterality: Right;   DIRECT LARYNGOSCOPY WITH RADIAESSE INJECTION Bilateral 08/05/2023   Procedure: SUSPENDED MIRCO DIRECT LARYNGOSCOPY WITH PROLARYN INJECTION;  Surgeon: Christia Reading, MD;  Location: [facility] OR;  Service: ENT;  Laterality: Bilateral;   EXCISION RIGHT WRIST MASS  2012   EXTRACORPOREAL SHOCK WAVE LITHOTRIPSY Left 06/01/2019   Procedure: EXTRACORPOREAL SHOCK WAVE LITHOTRIPSY (ESWL);  Surgeon: Malen Gauze, MD;  Location: [facility] ORS;  Service: Urology;  Laterality: Left;   IR ANGIOGRAM EXTREMITY RIGHT  07/09/2023   IR ANGIOGRAM SELECTIVE EACH ADDITIONAL VESSEL  07/09/2023   IR RADIOLOGIST EVAL & MGMT  05/28/2023   IR US GUIDE VASC ACCESS RIGHT  07/09/2023   KNEE ARTHROSCOPY Right 1999   MASS EXCISION Right 03/11/2014   Procedure: RIGHT WRIST DEEP MASS EXCISION WITH CULTURE AND BIOSPY;  Surgeon: Sharma Covert, MD;  Location: [facility];  Service: Orthopedics;  Laterality: Right;   MICROLARYNGOSCOPY W/VOCAL CORD INJECTION Bilateral 02/08/2021   Procedure: MICROLARYNGOSCOPY WITH VOCAL CORD INJECTION;  Surgeon: Christia Reading, MD;  Location: [facility] OR;  Service: ENT;  Laterality: Bilateral;   REMOVAL OF PLEURAL DRAINAGE CATHETER Right 10/14/2014   Procedure: REMOVAL OF PLEURAL DRAINAGE CATHETER;  Surgeon: Delight Ovens, MD;  Location: [facility] OR;  Service: Thoracic;  Laterality: Right;   TALC PLEURODESIS Right 09/16/2014   Procedure: TALC PLEURADESIS/ slurry;  Surgeon: Delight Ovens, MD;  Location: [facility] OR;  Service: Thoracic;  Laterality: Right;   THORACOSCOPY  01-26-2009   w/   lung/  node biopsy's   THUMB ARTHROSCOPY Left    - removed bone spur   TONSILLECTOMY  AS CHILD   TOTAL ABDOMINAL HYSTERECTOMY W/ BILATERAL SALPINGOOPHORECTOMY  1982   W/  APPENDECTOMY   TRANSTHORACIC ECHOCARDIOGRAM  12-23-2008   NORMAL LV/  EF 65-70%/  MILD MR  &  TR   VAULT SUSPENSION PLUS CYSTOCELE REPAIR WITH GRAFT  06-13-2010     A IV Location/Drains/Wounds Patient Lines/Drains/Airways Status     Active Line/Drains/Airways     Name Placement date Placement time Site Days   Peripheral IV 11/24/23 20 G Right Forearm 11/24/23  1400  Forearm  less than 1   Pressure  Injury 12/26/22 Hand Posterior;Right skin issue 12/26/22  0100  -- 333            Intake/Output Last 24 hours  Intake/Output Summary (Last 24 hours) at 11/24/2023 2003 Last data filed at 11/24/2023 1824 Gross per 24 hour  Intake 350.17 ml  Output --  Net 350.17 ml    Labs/Imaging Results for orders placed or performed during the hospital encounter of 11/24/23 (from the past 48 hour(s))  Resp panel by RT-PCR (RSV, Flu A&B, Covid) Anterior Nasal Swab     Status: None   Collection Time: 11/24/23 10:27 AM   Specimen: Anterior Nasal Swab  Result Value Ref Range   SARS Coronavirus 2 by RT PCR NEGATIVE NEGATIVE    Comment: (NOTE)  SARS-CoV-2 target nucleic acids are NOT DETECTED.  The SARS-CoV-2 RNA is generally detectable in upper respiratory specimens during the acute phase of infection. The lowest concentration of SARS-CoV-2 viral copies this assay can detect is 138 copies/mL. A negative result does not preclude SARS-Cov-2 infection and should not be used as the sole basis for treatment or other patient management decisions. A negative result may occur with  improper specimen collection/handling, submission of specimen other than nasopharyngeal swab, presence of viral mutation(s) within the areas targeted by this assay, and inadequate number of viral copies(<138 copies/mL). A negative result must be combined with clinical observations, patient history, and epidemiological information. The expected result is Negative.  Fact Sheet for Patients:  BloggerCourse.com  Fact Sheet for Healthcare Providers:  SeriousBroker.it  This test is no t yet approved or cleared by the [place] FDA and  has been authorized for detection and/or diagnosis of SARS-CoV-2 by FDA under an Emergency Use Authorization (EUA). This EUA will remain  in effect (meaning this test can be used) for the duration of the COVID-19 declaration under Section  564(b)(1) of the Act, 21 U.S.C.section 360bbb-3(b)(1), unless the authorization is terminated  or revoked sooner.       Influenza A by PCR NEGATIVE NEGATIVE   Influenza B by PCR NEGATIVE NEGATIVE    Comment: (NOTE) The Xpert Xpress SARS-CoV-2/FLU/RSV plus assay is intended as an aid in the diagnosis of influenza from Nasopharyngeal swab specimens and should not be used as a sole basis for treatment. Nasal washings and aspirates are unacceptable for Xpert Xpress SARS-CoV-2/FLU/RSV testing.  Fact Sheet for Patients: BloggerCourse.com  Fact Sheet for Healthcare Providers: SeriousBroker.it  This test is not yet approved or cleared by the [place] FDA and has been authorized for detection and/or diagnosis of SARS-CoV-2 by FDA under an Emergency Use Authorization (EUA). This EUA will remain in effect (meaning this test can be used) for the duration of the COVID-19 declaration under Section 564(b)(1) of the Act, 21 U.S.C. section 360bbb-3(b)(1), unless the authorization is terminated or revoked.     Resp Syncytial Virus by PCR NEGATIVE NEGATIVE    Comment: (NOTE) Fact Sheet for Patients: BloggerCourse.com  Fact Sheet for Healthcare Providers: SeriousBroker.it  This test is not yet approved or cleared by the [place] FDA and has been authorized for detection and/or diagnosis of SARS-CoV-2 by FDA under an Emergency Use Authorization (EUA). This EUA will remain in effect (meaning this test can be used) for the duration of the COVID-19 declaration under Section 564(b)(1) of the Act, 21 U.S.C. section 360bbb-3(b)(1), unless the authorization is terminated or revoked.  Performed at [facility], [address], [place], [place] [address]   Brain natriuretic peptide     Status: Abnormal   Collection Time: 11/24/23 10:40 AM  Result Value Ref Range   B  Natriuretic Peptide 163.6 (H) 0.0 - 100.0 pg/mL    Comment: Performed at [facility], [address], [place], [place] [address]  CBC with Differential     Status: Abnormal   Collection Time: 11/24/23 10:40 AM  Result Value Ref Range   WBC 10.7 (H) 4.0 - 10.5 K/uL   RBC 3.98 3.87 - 5.11 MIL/uL   Hemoglobin 12.3 12.0 - 15.0 g/dL   HCT 82.9 56.2 - 13.0 %   MCV 95.0 80.0 - 100.0 fL   MCH 30.9 26.0 - 34.0 pg   MCHC 32.5 30.0 - 36.0 g/dL   RDW 86.5 78.4 - 69.6 %  Platelets 237 150 - 400 K/uL   nRBC 0.0 0.0 - 0.2 %   Neutrophils Relative % 91 %   Neutro Abs 9.8 (H) 1.7 - 7.7 K/uL   Lymphocytes Relative 2 %   Lymphs Abs 0.2 (L) 0.7 - 4.0 K/uL   Monocytes Relative 6 %   Monocytes Absolute 0.6 0.1 - 1.0 K/uL   Eosinophils Relative 0 %   Eosinophils Absolute 0.0 0.0 - 0.5 K/uL   Basophils Relative 0 %   Basophils Absolute 0.0 0.0 - 0.1 K/uL   Immature Granulocytes 1 %   Abs Immature Granulocytes 0.05 0.00 - 0.07 K/uL    Comment: Performed at [facility], [address], [place], [place] [address]  Comprehensive metabolic panel     Status: Abnormal   Collection Time: 11/24/23 10:40 AM  Result Value Ref Range   Sodium 139 135 - 145 mmol/L   Potassium 3.3 (L) 3.5 - 5.1 mmol/L   Chloride 106 98 - 111 mmol/L   CO2 24 22 - 32 mmol/L   Glucose, Bld 136 (H) 70 - 99 mg/dL    Comment: Glucose reference range applies only to samples taken after fasting for at least 8 hours.   BUN 24 (H) 8 - 23 mg/dL   Creatinine, Ser 1.02 0.44 - 1.00 mg/dL   Calcium 8.0 (L) 8.9 - 10.3 mg/dL   Total Protein 5.6 (L) 6.5 - 8.1 g/dL   Albumin 3.2 (L) 3.5 - 5.0 g/dL   AST 27 15 - 41 U/L   ALT 25 0 - 44 U/L   Alkaline Phosphatase 41 38 - 126 U/L   Total Bilirubin 1.0 <1.2 mg/dL   GFR, Estimated >72 >53 mL/min    Comment: (NOTE) Calculated using the CKD-EPI Creatinine Equation (2021)    Anion gap 9 5 - 15    Comment: Performed at [facility], [address] [place], [place] [address]  Troponin I (High Sensitivity)     Status: Abnormal   Collection Time: 11/24/23 10:40 AM  Result Value Ref Range   Troponin I (High Sensitivity) 41 (H) <18 ng/L    Comment: (NOTE) Elevated high sensitivity troponin I (hsTnI) values and significant  changes across serial measurements may suggest ACS but many other  chronic and acute conditions are known to elevate hsTnI results.  Refer to the "Links" section for chest pain algorithms and additional  guidance. Performed at [facility], [address] [place], [place] [address]   Troponin I (High Sensitivity)     Status: Abnormal   Collection Time: 11/24/23  2:05 PM  Result Value Ref Range   Troponin I (High Sensitivity) 42 (H) <18 ng/L    Comment: (NOTE) Elevated high sensitivity troponin I (hsTnI) values and significant  changes across serial measurements may suggest ACS but many other  chronic and acute conditions are known to elevate hsTnI results.  Refer to the "Links" section for chest pain algorithms and additional  guidance. Performed at [facility], [address], [place], [place] [address]   Magnesium     Status: Abnormal   Collection Time: 11/24/23  2:05 PM  Result Value Ref Range   Magnesium 1.5 (L) 1.7 - 2.4 mg/dL    Comment: Performed at [facility], [address] [place], [place] [address]  Phosphorus     Status: None   Collection Time: 11/24/23  2:05 PM  Result Value Ref Range   Phosphorus 3.2  2.5 - 4.6 mg/dL    Comment: Performed at [facility], [address] [place], [place] [address]  Protime-INR     Status: None   Collection Time: 11/24/23  3:40 PM  Result Value Ref Range   Prothrombin Time 15.0 11.4 - 15.2 seconds   INR 1.2 0.8 - 1.2    Comment: (NOTE) INR goal varies based on device and disease states. Performed at [facility], [address] [place], [place]  [address]   APTT     Status: Abnormal   Collection Time: 11/24/23  4:45 PM  Result Value Ref Range   aPTT 22 (L) 24 - 36 seconds    Comment: Performed at [facility], [address] [place], [place] [address]   *Note: Due to a large number of results and/or encounters for the requested time period, some results have not been displayed. A complete set of results can be found in Results Review.   CT Angio Chest PE W and/or Wo Contrast  Result Date: 11/24/2023 CLINICAL DATA:  Fever and chills. History of non-small-cell lung cancer. * Tracking Code: BO * EXAM: CT ANGIOGRAPHY CHEST WITH CONTRAST TECHNIQUE: Multidetector CT imaging of the chest was performed using the standard protocol during bolus administration of intravenous contrast. Multiplanar CT image reconstructions and MIPs were obtained to evaluate the vascular anatomy. RADIATION DOSE REDUCTION: This exam was performed according to the departmental dose-optimization program which includes automated exposure control, adjustment of the mA and/or kV according to patient size and/or use of iterative reconstruction technique. CONTRAST:  75mL OMNIPAQUE IOHEXOL 350 MG/ML SOLN COMPARISON:  X-ray earlier 11/24/2023. Chest CT with contrast 07/15/2023. FINDINGS: Cardiovascular: Heart is enlarged. Small to moderate pericardial effusion again seen, similar to previous. Coronary artery calcifications are identified. The thoracic aorta has a overall normal course and caliber with scattered atherosclerotic changes. There is a bovine type aortic arch. Breathing motion identified which limits evaluation, nondiagnostic for small and peripheral emboli. There is filling defect however along a segmental vessel of the left lower lobe on series 4, image 90 consistent with a small embolus. No larger or more central emboli identified. Mediastinum/Nodes: Dilated esophagus identified. There is some presumed contrast within the lumen of the esophagus or other  high attenuation debris. Wall thickening identified. Small thyroid gland. No specific abnormal lymph node enlargement identified in the axillary region or left hilum. There is confluence abnormal soft tissue throughout the mediastinum which has increased from previous. No discrete nodal enlargement but there could be some enlarged nodes within the mediastinum. This also could be treatment related. Lungs/Pleura: Significant breathing motion. Left lung has a trace pleural fluid. No pneumothorax. Patchy parenchymal nodular opacity along the anterior left upper lobe is similar today measuring 15 mm. Series 100, image 52. Additional small nodule in the left lung apex on series 100, image 12 is stable with some surrounding ground-glass. Small right pleural effusion, increased from previous with some pleural thickening. There is significant increase in parenchymal opacity seen in the right lower lobe extending to the hilum. Persistent bandlike changes along the margin of the right side of the mediastinum diffusely with bronchiectasis and distortion. This could be posttreatment related. Areas of interstitial septal thickening in the right hemithorax. No pneumothorax. Stable high density along the pleural region of the right lung base. Upper Abdomen: Stones in the gallbladder dependently. Preserved adrenal glands. Musculoskeletal: Curvature of the spine with degenerative changes. Increasing anasarca. Review of the MIP images confirms the  above findings. Critical Value/emergent results were called by telephone at the time of interpretation on 11/24/2023 at 12:10 pm PST to provider Tanda Rockers , who verbally acknowledged these results. IMPRESSION: Segmental small left lower lobe pulmonary embolism. Increasing edema and soft tissue thickening throughout the mediastinum. Increasing anasarca There is also increasing parenchymal opacity in the right lower lobe. This has a differential including infiltrate, however recommend short  follow up to exclude a more aggressive process with the patient's history. Slight increase in tiny right effusion as well. Stable areas of nodularity left upper lobe. Recommend continued surveillance. Enlarged heart with stable pericardial effusion. Dilated esophagus with some luminal high density material. Aortic Atherosclerosis (ICD10-I70.0) and Emphysema (ICD10-J43.9). Electronically Signed   By: Karen Kays M.D.   On: 11/24/2023 15:19   DG Chest Port 1 View  Result Date: 11/24/2023 CLINICAL DATA:  Shortness of breath, body aches and chills. History of prior right thoracotomy and talc pleurodesis for pleural effusion in 2015. EXAM: PORTABLE CHEST 1 VIEW COMPARISON:  08/24/2023 FINDINGS: Stable cardiac enlargement. Stable chronic pleural thickening and parenchymal density at the right lung base likely related to prior pleurodesis and scarring. Component of acute infection involving the right lower lung would be very difficult to exclude. No pulmonary edema or pneumothorax identified. The visualized skeletal structures are unremarkable. IMPRESSION: Stable chronic pleural thickening and parenchymal density at the right lung base likely related to prior pleurodesis and scarring. Component of acute infection involving the right lower lung would be very difficult to exclude. Stable cardiac enlargement. Electronically Signed   By: Irish Lack M.D.   On: 11/24/2023 11:07    Pending Labs Unresulted Labs (From admission, onward)     Start     Ordered   11/25/23 0500  CBC  Daily,   R      11/24/23 1625   11/25/23 0500  Comprehensive metabolic panel  Tomorrow morning,   R        11/24/23 1703   11/25/23 0100  Heparin level (unfractionated)  Once-Timed,   TIMED        11/24/23 1709   11/24/23 1943  MRSA Next Gen by PCR, Nasal  Once,   R        11/24/23 1942   11/24/23 1702  Legionella Urine Antigen  (COPD / Pneumonia / Cellulitis / Lower Extremity Wound)  ONCE - URGENT,   URGENT        11/24/23 1703    11/24/23 1702  Strep pneumoniae urinary antigen  (COPD / Pneumonia / Cellulitis / Lower Extremity Wound)  ONCE - URGENT,   URGENT        11/24/23 1703   11/24/23 1702  Expectorated Sputum Assessment w Gram Stain, Rflx to Resp Cult  (COPD / Pneumonia / Cellulitis / Lower Extremity Wound)  ONCE - URGENT,   URGENT        11/24/23 1703   11/24/23 1702  Magnesium  Add-on,   AD        11/24/23 1703   11/24/23 1702  Phosphorus  Add-on,   AD        11/24/23 1703            Vitals/Pain Today's Vitals   11/24/23 1535 11/24/23 1613 11/24/23 1832 11/24/23 1941  BP:    123/74  Pulse:    (!) 109  Resp:   (!) 22 19  Temp:   98.1 F (36.7 C) 98.5 F (36.9 C)  TempSrc:   Oral Oral  SpO2:    98%  Weight: 47.5 kg     Height:  5\' 2"  (1.575 m)    PainSc:    0-No pain    Isolation Precautions No active isolations  Medications Medications  cefTRIAXone (ROCEPHIN) 1 g in sodium chloride 0.9 % 100 mL IVPB (has no administration in time range)  azithromycin (ZITHROMAX) 500 mg in sodium chloride 0.9 % 250 mL IVPB (has no administration in time range)  heparin bolus via infusion 1,450 Units (1,450 Units Intravenous Bolus from Bag 11/24/23 1639)    Followed by  heparin ADULT infusion 100 units/mL (25000 units/212mL) (750 Units/hr Intravenous New Bag/Given 11/24/23 1653)  acetaminophen (TYLENOL) tablet 650 mg (has no administration in time range)    Or  acetaminophen (TYLENOL) suppository 650 mg (has no administration in time range)  ondansetron (ZOFRAN) tablet 4 mg (has no administration in time range)    Or  ondansetron (ZOFRAN) injection 4 mg (has no administration in time range)  albuterol (PROVENTIL) (2.5 MG/3ML) 0.083% nebulizer solution 2.5 mg (has no administration in time range)  albuterol (PROVENTIL) (2.5 MG/3ML) 0.083% nebulizer solution 2.5 mg (2.5 mg Nebulization Given 11/24/23 1849)  mirtazapine (REMERON) tablet 7.5 mg (has no administration in time range)  Vitamin D 2,000 Units (has no  administration in time range)  Calcium 600+D Plus Minerals 600-400 MG-UNIT TABS 1 tablet (has no administration in time range)  lactase (LACTAID) tablet 3,000 Units (has no administration in time range)  arformoterol (BROVANA) nebulizer solution 15 mcg (has no administration in time range)    And  umeclidinium bromide (INCRUSE ELLIPTA) 62.5 MCG/ACT 1 puff (has no administration in time range)  cyanocobalamin (VITAMIN B12) tablet 1,000 mcg (has no administration in time range)  Chlorhexidine Gluconate Cloth 2 % PADS 6 each (has no administration in time range)  sodium chloride 0.9 % bolus 500 mL (0 mLs Intravenous Stopped 11/24/23 1400)  iohexol (OMNIPAQUE) 350 MG/ML injection 75 mL (75 mLs Intravenous Contrast Given 11/24/23 1426)  potassium chloride SA (KLOR-CON M) CR tablet 40 mEq (40 mEq Oral Given 11/24/23 1406)  cefTRIAXone (ROCEPHIN) 1 g in sodium chloride 0.9 % 100 mL IVPB (0 g Intravenous Stopped 11/24/23 1439)  azithromycin (ZITHROMAX) 500 mg in sodium chloride 0.9 % 250 mL IVPB (0 mg Intravenous Stopped 11/24/23 1824)  magnesium sulfate IVPB 2 g 50 mL (2 g Intravenous New Bag/Given 11/24/23 1848)  methylPREDNISolone sodium succinate (SOLU-MEDROL) 40 mg/mL injection 40 mg (40 mg Intravenous Given 11/24/23 1849)    Mobility walks with walker      R  Report given to: Berdie Ogren, RN

## 2023-11-24 NOTE — Progress Notes (Signed)
PHARMACY - ANTICOAGULATION CONSULT NOTE  Pharmacy Consult for IV heparin Indication: pulmonary embolus  Allergies  Allergen Reactions   Methotrexate Shortness Of Breath   Clindamycin Hcl Diarrhea and Other (See Comments)   Lactose Intolerance (Gi)     Upset stomach   Amoxicillin Diarrhea    Hx of C Diff    Protonix [Pantoprazole Sodium] Rash    Patient Measurements: Height: 5\' 2"  (157.5 cm) Weight: 47.5 kg (104 lb 12.8 oz) IBW/kg (Calculated) : 50.1 Heparin Dosing Weight: total body weight   Vital Signs: Temp: 98.1 F (36.7 C) (12/01 1451) Temp Source: Oral (12/01 1109) BP: 110/68 (12/01 1345) Pulse Rate: 106 (12/01 1451)  Labs: Recent Labs    11/24/23 1040 11/24/23 1405 11/24/23 1540  HGB 12.3  --   --   HCT 37.8  --   --   PLT 237  --   --   LABPROT  --   --  15.0  INR  --   --  1.2  CREATININE 0.80  --   --   TROPONINIHS 41* 42*  --     Estimated Creatinine Clearance: 37.9 mL/min (by C-G formula based on SCr of 0.8 mg/dL).   Medical History: Past Medical History:  Diagnosis Date   Anemia    Arthritis    HANDS,  WRISTS   CAP (community acquired pneumonia) 12/25/2022   COPD GOLD 0/ lama/laba responsive 07/11/2019   Quit smoking 1979  07/05/2016  Walked RA x 3 laps @ 185 ft each stopped due to  End of study, nl pace no desat or sob   - Spirometry 07/05/2016  wnl p am spiriva   - CTa chest 07/05/2016 neg pe/ neg ILD, R infrahilar density c/w lung ca vs post RT scar    Chest CT w contrast  09/12/18  Stable CT of the chest  - 11/25/2018   Walked RA  2 laps @ 278ft each @ moderate pace  stopped due to  desats to 86% c   Depression 04/04/2017   Dyspnea    Dyspnea on exertion    Encounter for therapeutic drug monitoring 10/01/2016   GERD (gastroesophageal reflux disease)    at times   Hemorrhoid    History of anal fissures    History of kidney stones    History of lung cancer ONCOLOGIST--  DR Arbutus Ped--  LAST CT ,  NO RECURRENCE OR METS   DX JAN 2010 --  STAGE  IIIA  NON-SMALL CELL ADENOCARCINOMA (RIGHT MIDDLE LOBE)---  S/P  CHEMORADIATIO THERAPY  (COMPLETE 03-21-2009)   HTN (hypertension) 10/10/2017   Imbalance 06/04/2017   lung ca dx'd 2010   Normal cardiac stress test    2007  PER PT   Osteoporosis    PONV (postoperative nausea and vomiting)    Rash, skin    RIGHT FOREARM/ HAND   Right wrist pain    MASS   Wears glasses     Medications:  No anticoagulants PTA  Assessment: 67 y/oF with PMH of NSCLC who presents with dyspnea, body aches, nausea, chills x 3 days. CTa chest + for segmental small left lower lobe pulmonary embolism. Pharmacy consulted for IV heparin dosing. CBC: Hgb/Hct WNL, Plt WNL.   Goal of Therapy:  Heparin level 0.3-0.7 units/ml Monitor platelets by anticoagulation protocol: Yes   Plan:  Baseline aPTT, PT/INR ordered Heparin 1450 units bolus IV x 1, then start heparin infusion at 750 units/hr Heparin level 8 hours after initiation Daily CBC, heparin level  Monitor closely for s/sx of bleeding    Greer Pickerel, PharmD, BCPS Clinical Pharmacist 11/24/2023,4:15 PM

## 2023-11-24 NOTE — ED Provider Notes (Signed)
Care of patient received from prior provider at 3:43 PM, please see their note for complete H/P and care plan.  Received handoff per ED course.  Clinical Course as of 11/24/23 1543  Sun Nov 24, 2023  1204 Troponin I (High Sensitivity)(!): 41 Similar to prior  [SG]  1324 Potassium(!): 3.3 Replace orally [SG]  1324 Resp panel by RT-PCR (RSV, Flu A&B, Covid) Anterior Nasal Swab negative [SG]  1325 DG Chest Port 1 View Stable chest but unable to rule out right sided infiltrate  She is having URI symptoms, hypoxia, will start rocephin, CT chest pending  [SG]  1515 Spoke with radiology, CT PE concerning for single segmental PE, possible pneumonia.  Continue plan for admission [SG]  1542 Stable SOB 88% on room air x 3 days on [facility] Left sided PE. Heparin ordered. Hx of lung cancer.  [CC]    Clinical Course User Index [CC] Glyn Ade, MD [SG] Sloan Leiter, DO   CRITICAL CARE Performed by: Glyn Ade   Total critical care time: 45 minutes  Critical care time was exclusive of separately billable procedures and treating other patients.  Critical care was necessary to treat or prevent imminent or life-threatening deterioration.  Critical care was time spent personally by me on the following activities: development of treatment plan with patient and/or surrogate as well as nursing, discussions with consultants, evaluation of patient's response to treatment, examination of patient, obtaining history from patient or surrogate, ordering and performing treatments and interventions, ordering and review of laboratory studies, ordering and review of radiographic studies, pulse oximetry and re-evaluation of patient's condition.   Reassessment: Reassessed.  No acute distress.  Agree with plan.  Heparinization for acute pulmonary embolism.  On new oxygen requirement for hypoxia. Discussed with hospitalist who is in agreement with admission.   Disposition:   Based on the above  findings, I believe this patient is stable for admission.    Patient/family educated about specific findings on our evaluation and explained exact reasons for admission.  Patient/family educated about clinical situation and time was allowed to answer questions.   Admission team communicated with and agreed with need for admission. Patient admitted. Patient ready to move at this time.     Emergency Department Medication Summary:   Medications  cefTRIAXone (ROCEPHIN) 1 g in sodium chloride 0.9 % 100 mL IVPB (has no administration in time range)  azithromycin (ZITHROMAX) 500 mg in sodium chloride 0.9 % 250 mL IVPB (has no administration in time range)  heparin bolus via infusion 1,450 Units (1,450 Units Intravenous Bolus from Bag 11/24/23 1639)    Followed by  heparin ADULT infusion 100 units/mL (25000 units/232mL) (750 Units/hr Intravenous New Bag/Given 11/24/23 1653)  acetaminophen (TYLENOL) tablet 650 mg (has no administration in time range)    Or  acetaminophen (TYLENOL) suppository 650 mg (has no administration in time range)  ondansetron (ZOFRAN) tablet 4 mg (has no administration in time range)    Or  ondansetron (ZOFRAN) injection 4 mg (has no administration in time range)  albuterol (PROVENTIL) (2.5 MG/3ML) 0.083% nebulizer solution 2.5 mg (has no administration in time range)  albuterol (PROVENTIL) (2.5 MG/3ML) 0.083% nebulizer solution 2.5 mg (2.5 mg Nebulization Given 11/24/23 1849)  mirtazapine (REMERON) tablet 7.5 mg (has no administration in time range)  Vitamin D 2,000 Units (has no administration in time range)  Calcium 600+D Plus Minerals 600-400 MG-UNIT TABS 1 tablet (has no administration in time range)  lactase (LACTAID) tablet 3,000 Units (has no administration in  time range)  arformoterol (BROVANA) nebulizer solution 15 mcg (has no administration in time range)    And  umeclidinium bromide (INCRUSE ELLIPTA) 62.5 MCG/ACT 1 puff (has no administration in time range)   cyanocobalamin (VITAMIN B12) tablet 1,000 mcg (has no administration in time range)  Chlorhexidine Gluconate Cloth 2 % PADS 6 each (has no administration in time range)  Oral care mouth rinse (has no administration in time range)  sodium chloride 0.9 % bolus 500 mL (0 mLs Intravenous Stopped 11/24/23 1400)  iohexol (OMNIPAQUE) 350 MG/ML injection 75 mL (75 mLs Intravenous Contrast Given 11/24/23 1426)  potassium chloride SA (KLOR-CON M) CR tablet 40 mEq (40 mEq Oral Given 11/24/23 1406)  cefTRIAXone (ROCEPHIN) 1 g in sodium chloride 0.9 % 100 mL IVPB (0 g Intravenous Stopped 11/24/23 1439)  azithromycin (ZITHROMAX) 500 mg in sodium chloride 0.9 % 250 mL IVPB (0 mg Intravenous Stopped 11/24/23 1824)  magnesium sulfate IVPB 2 g 50 mL (0 g Intravenous Stopped 11/24/23 2048)  methylPREDNISolone sodium succinate (SOLU-MEDROL) 40 mg/mL injection 40 mg (40 mg Intravenous Given 11/24/23 1849)            Glyn Ade, MD 11/24/23 2140

## 2023-11-24 NOTE — ED Notes (Signed)
Contact Drue Stager with updates (DIL)

## 2023-11-24 NOTE — H&P (Signed)
History and Physical    Patient: LACIE LEHL WUJ:[id number] DOB: 15-Aug-1935 DOA: 11/24/2023 DOS: the patient was seen and examined on 11/24/2023 PCP: Gweneth Dimitri, MD  Patient coming from: Home  Chief Complaint:  Chief Complaint  Patient presents with   Shortness of Breath   HPI: JABREE SURABIAN is a 88 y.o. female with medical history significant of anemia, osteoarthritis, rheumatoid arthritis, dyspnea on exertion, depression, GERD, hemorrhoids, anal fissures, nephrolithiasis, osteoporosis, skin rash, hypertension, COPD, history of pneumonia, history of lung cancer, history of radiation pneumonitis who is coming to the emergency department due to progressively worse dyspnea associated with productive cough, wheezing, fatigue, chills, body aches, nausea and decreased appetite since 3 days ago.  She denied fever, rhinorrhea or hemoptysis.  No chest pain, palpitations, diaphoresis, PND, orthopnea or but has frequent pitting edema of the lower extremities.  No abdominal pain, emesis, diarrhea, constipation, melena or hematochezia.  No flank pain, dysuria, frequency or hematuria.  No polyuria, polydipsia, polyphagia or blurred vision.   Lab work: CBC showed a white count of 10.7, hemoglobin 12.3 g/dL platelets 956.  Troponin was 41 and then 42 ng/L.  BNP 163.6 pg/mL.  Negative coronavirus, influenza and RSV PCR.  CMP showed a potassium of 3.3 mmol/L, corrected calcium 8.7 mg/dL, the rest of the electrolytes were normal.  Glucose 136, BUN 24 and creatinine 0.80 mg/dL.  Total protein is 5.6 and albumin 3.2 g/dL, the rest of the LFTs were normal.  Imaging: Portable 1 view chest radiograph showing a stable chronic pleural thickening and parenchymal density at the right lung base likely related to prior gluteal disease and scaring.  Component of acute infection involving the right lower lung would be very difficult to exclude.  Stable cardiac enlargement.  CTA chest showing segmental small left lower lobe  pulmonary embolism.  Increasing edema and soft tissue thickening throughout the mediastinum.  Increasing anasarca.  Also increase in parenchymal opacity in the right lower lobe.  This could be an infiltrate, however short-term follow-up recommended to exclude a more aggressive process with the patient's history.  There is a slight increase in in tiny right effusion as well.  Stable areas of nodularity at the left upper lobe.  Continued surveillance recommended.  Cardiomegaly with stable pericardial effusion.  Dilated esophagus with some luminal high density material.  Aortic atherosclerosis and emphysema.   ED course: Initial vital signs were temperature 98 F, pulse 7018, respiration 21, BP 122/77 mmHg O2 sat 89% on room air.  The patient received 5000 mL normal saline bolus, KCl 40 mill equivalents p.o. x 1, ceftriaxone 1 g IVPB and azithromycin 500 mg IVPB.  She was started on heparin per pharmacy.  Review of Systems: As mentioned in the history of present illness. All other systems reviewed and are negative. Past Medical History:  Diagnosis Date   Anemia    Arthritis    HANDS,  WRISTS   Dyspnea    Dyspnea on exertion    Encounter for therapeutic drug monitoring 10/01/2016   GERD (gastroesophageal reflux disease)    at times   Hemorrhoid    History of anal fissures    History of kidney stones    History of lung cancer ONCOLOGIST--  DR Arbutus Ped--  LAST CT ,  NO RECURRENCE OR METS   DX JAN 2010 --  STAGE IIIA  NON-SMALL CELL ADENOCARCINOMA (RIGHT MIDDLE LOBE)---  S/P  CHEMORADIATIO THERAPY  (COMPLETE 03-21-2009)   Imbalance 06/04/2017   lung ca dx'd 2010  Normal cardiac stress test    2007  PER PT   Osteoporosis    PONV (postoperative nausea and vomiting)    Rash, skin    RIGHT FOREARM/ HAND   Right wrist pain    MASS   Wears glasses    Past Surgical History:  Procedure Laterality Date   ANAL FISSURE REPAIR  07/09/2011   INTERNAL SPHINCTEROTOMY   BENIGN EXCISION LEFT BREAST  CENTRAL DUCT  02/1999   BREAST BIOPSY  01/31/2009   benign   BREAST EXCISIONAL BIOPSY Left 2004   benign   CHEST TUBE INSERTION Right 07/14/2014   Procedure: INSERTION PLEURAL DRAINAGE CATHETER RIGHT CHEST;  Surgeon: Delight Ovens, MD;  Location: [facility] OR;  Service: Thoracic;  Laterality: Right;   DIRECT LARYNGOSCOPY WITH RADIAESSE INJECTION Bilateral 08/05/2023   Procedure: SUSPENDED MIRCO DIRECT LARYNGOSCOPY WITH PROLARYN INJECTION;  Surgeon: Christia Reading, MD;  Location: [facility] OR;  Service: ENT;  Laterality: Bilateral;   EXCISION RIGHT WRIST MASS  2012   EXTRACORPOREAL SHOCK WAVE LITHOTRIPSY Left 06/01/2019   Procedure: EXTRACORPOREAL SHOCK WAVE LITHOTRIPSY (ESWL);  Surgeon: Malen Gauze, MD;  Location: [facility] ORS;  Service: Urology;  Laterality: Left;   IR ANGIOGRAM EXTREMITY RIGHT  07/09/2023   IR ANGIOGRAM SELECTIVE EACH ADDITIONAL VESSEL  07/09/2023   IR RADIOLOGIST EVAL & MGMT  05/28/2023   IR US GUIDE VASC ACCESS RIGHT  07/09/2023   KNEE ARTHROSCOPY Right 1999   MASS EXCISION Right 03/11/2014   Procedure: RIGHT WRIST DEEP MASS EXCISION WITH CULTURE AND BIOSPY;  Surgeon: Sharma Covert, MD;  Location: [facility];  Service: Orthopedics;  Laterality: Right;   MICROLARYNGOSCOPY W/VOCAL CORD INJECTION Bilateral 02/08/2021   Procedure: MICROLARYNGOSCOPY WITH VOCAL CORD INJECTION;  Surgeon: Christia Reading, MD;  Location: [facility] OR;  Service: ENT;  Laterality: Bilateral;   REMOVAL OF PLEURAL DRAINAGE CATHETER Right 10/14/2014   Procedure: REMOVAL OF PLEURAL DRAINAGE CATHETER;  Surgeon: Delight Ovens, MD;  Location: [facility] OR;  Service: Thoracic;  Laterality: Right;   TALC PLEURODESIS Right 09/16/2014   Procedure: TALC PLEURADESIS/ slurry;  Surgeon: Delight Ovens, MD;  Location: [facility] OR;  Service: Thoracic;  Laterality: Right;   THORACOSCOPY  01-26-2009   w/   lung/  node biopsy's   THUMB ARTHROSCOPY Left    - removed bone spur   TONSILLECTOMY  AS CHILD   TOTAL ABDOMINAL HYSTERECTOMY  W/ BILATERAL SALPINGOOPHORECTOMY  1982   W/  APPENDECTOMY   TRANSTHORACIC ECHOCARDIOGRAM  12-23-2008   NORMAL LV/  EF 65-70%/  MILD MR  &  TR   VAULT SUSPENSION PLUS CYSTOCELE REPAIR WITH GRAFT  06-13-2010   Social History:  reports that she quit smoking about 45 years ago. Her smoking use included cigarettes. She started smoking about 75 years ago. She has a 30 pack-year smoking history. She has never used smokeless tobacco. She reports that she does not drink alcohol and does not use drugs.  Allergies  Allergen Reactions   Methotrexate Shortness Of Breath   Clindamycin Hcl Diarrhea and Other (See Comments)   Lactose Intolerance (Gi)     Upset stomach   Amoxicillin Diarrhea    Hx of C Diff    Protonix [Pantoprazole Sodium] Rash    Family History  Problem Relation Age of Onset   Cancer Father        bladder   Cancer Son        kidney - removed as a teenager    Prior to Admission  medications   Medication Sig Start Date End Date Taking? Authorizing Provider  acetaminophen (TYLENOL) 650 MG CR tablet Take 650 mg by mouth daily.    [provider]  AMBULATORY NON FORMULARY MEDICATION R knee brace Dispense 1 Dx code: M17.11 Use as needed 09/30/23   Rodolph Bong, MD  b complex vitamins capsule Take 1 capsule by mouth daily.    [provider]  bismuth subsalicylate (PEPTO BISMOL) 262 MG chewable tablet Chew 262 mg by mouth as needed for indigestion or diarrhea or loose stools.    [provider]  Calcium Carbonate-Vit D-Min (CALCIUM 600+D PLUS MINERALS) 600-400 MG-UNIT TABS Take 1 tablet by mouth 2 (two) times daily.    [provider]  Cholecalciferol (VITAMIN D) 50 MCG (2000 UT) tablet Take 2,000 Units by mouth daily.    [provider]  desonide (DESOWEN) 0.05 % ointment Apply 1 Application topically 2 (two) times daily as needed (scaly skin). 08/12/23   [provider]  erlotinib (TARCEVA) 100 MG tablet Take 1 tablet (100 mg  total) by mouth daily. 10/16/23   Si Gaul, MD  hydrocortisone 2.5 % cream Apply 1 application  topically every other day. 05/08/22   [provider]  lactase (LACTAID) 3000 units tablet Take 3,000 Units by mouth daily as needed (when eating dairy products).    [provider]  loperamide (IMODIUM A-D) 2 MG tablet Take 2 mg by mouth as needed for diarrhea or loose stools.    [provider]  loratadine (CLARITIN) 10 MG tablet Take 10 mg by mouth daily as needed for allergies.    [provider]  mirtazapine (REMERON) 15 MG tablet Take 1 tablet (15 mg total) by mouth at bedtime. Patient taking differently: Take 7.5 mg by mouth at bedtime. 07/18/23   Heilingoetter, Cassandra L, PA-C  Multiple Vitamins-Minerals (HAIR SKIN AND NAILS FORMULA) TABS Take 1 tablet by mouth daily.    [provider]  Multiple Vitamins-Minerals (PRESERVISION AREDS 2 PO) Take 1 tablet by mouth 2 (two) times daily.    [provider]  Polyethyl Glycol-Propyl Glycol (SYSTANE OP) Place 1 drop into both eyes at bedtime.    [provider]  Probiotic Product (PROBIOTIC ACIDOPHILUS) CHEW Chew 1 tablet by mouth daily.    [provider]  tacrolimus (PROGRAF) 1 MG capsule 1 mg See admin instructions. Dissolve 1 mg capsule in 1/2 liter of water soaking a cotton ball and apply to wound twice daily 08/08/22   [provider]  Tiotropium Bromide-Olodaterol (STIOLTO RESPIMAT) 2.5-2.5 MCG/ACT AERS INHALE 2 PUFFS BY MOUTH ONCE DAILY 07/10/22   Nyoka Cowden, MD  vitamin B-12 (CYANOCOBALAMIN) 1000 MCG tablet Take 1,000 mcg by mouth daily.    [provider]    Physical Exam: Vitals:   11/24/23 1215 11/24/23 1345 11/24/23 1451 11/24/23 1535  BP: 122/77 110/68    Pulse: (!) 106 (!) 102 (!) 106   Resp: (!) 27 (!) 22 (!) 27   Temp:   98.1 F (36.7 C)   TempSrc:      SpO2: 97% 96% 98%   Weight:    47.5 kg   Physical Exam Vitals and nursing  note reviewed.  Constitutional:      General: She is awake. She is not in acute distress.    Appearance: She is normal weight. She is ill-appearing. She is not diaphoretic.     Interventions: Nasal cannula in place.  HENT:     Head:  Normocephalic.     Nose: No rhinorrhea.     Mouth/Throat:     Mouth: Mucous membranes are moist.  Eyes:     General: No scleral icterus.    Pupils: Pupils are equal, round, and reactive to light.  Neck:     Vascular: No JVD.  Cardiovascular:     Rate and Rhythm: Normal rate and regular rhythm.     Heart sounds: S1 normal and S2 normal.  Pulmonary:     Effort: Tachypnea present. No accessory muscle usage or respiratory distress.     Breath sounds: Wheezing and rhonchi present.  Abdominal:     General: Bowel sounds are normal. There is no distension.     Palpations: Abdomen is soft.     Tenderness: There is no abdominal tenderness. There is no guarding.  Musculoskeletal:     Cervical back: Neck supple.     Right lower leg: 1+ Pitting Edema present.     Left lower leg: 1+ Pitting Edema present.  Skin:    General: Skin is warm and dry.  Neurological:     General: No focal deficit present.     Mental Status: She is alert and oriented to person, place, and time.  Psychiatric:        Mood and Affect: Mood normal.        Behavior: Behavior normal. Behavior is cooperative.     Data Reviewed:  Results are pending, will review when available. 07/05/2020 TTE IMPRESSIONS:   1. Left ventricular ejection fraction, by estimation, is 60 to 65%. The  left ventricle has normal function. The left ventricle has no regional  wall motion abnormalities. There is mild left ventricular hypertrophy.  Left ventricular diastolic parameters  are consistent with Grade I diastolic dysfunction (impaired relaxation).   2. Right ventricular systolic function is normal. The right ventricular  size is normal. There is normal pulmonary artery systolic pressure.   3. The  mitral valve is degenerative. Mild mitral valve regurgitation. No  evidence of mitral stenosis.   4. The aortic valve is tricuspid. Aortic valve regurgitation is trivial.  Mild aortic valve sclerosis is present, with no evidence of aortic valve  stenosis.   5. The inferior vena cava is normal in size with greater than 50%  respiratory variability, suggesting right atrial pressure of 3 mmHg.   EKG: Vent. rate 117 BPM PR interval 162 ms QRS duration 80 ms QT/QTcB 315/440 ms P-R-T axes 69 -52 63 Sinus tachycardia Probable left atrial enlargement Left anterior fascicular block  Assessment and Plan: Principal Problem:   Acute respiratory failure with hypoxia (HCC) In the setting of:   Acute upper respiratory infection   COPD GOLD 0/ lama/laba responsive   CAP (community acquired pneumonia)   Pulmonary embolism (HCC)    History of lung cancer Admit to SDU/inpatient. Continue supplemental oxygen. Scheduled and as needed bronchodilators. Continue Stiolto Respimat or formulary equivalent. Continue ceftriaxone 1 g IVPB daily. Continue azithromycin 500 mg IVPB daily. Check strep pneumoniae urinary antigen. Check sputum Gram stain, culture and sensitivity. Follow-up blood culture and sensitivity. Follow-up CBC and chemistry in the morning. Continue heparin per pharmacy. Briefly discussed long-term anticoagulation. Check echocardiogram in AM.  Active Problems:   Cancer of right lung parenchyma (HCC) Hold Tarceva due to acute infection.    Elevated troponin Secondary to demand ischemia    Heart failure with preserved left ventricular function (HFpEF) (HCC) Will check echo as above mentioned.    Hypocalcemia Minimally low. Recheck calcium  with albumin level in AM.    Hypokalemia Replacing. Follow-up potassium level tomorrow.    Hypomagnesemia Supplementing with magnesium sulfate. Follow-up level as needed.    HTN (hypertension) Not on antihypertensives. Monitor blood  pressure.    GERD clinical dx only  Antiacid, H2 blocker or PPI as needed.    Mild protein malnutrition (HCC) In the setting of decreased oral intake/malignancy. May benefit from protein supplementation. Consider nutritional services evaluation. Follow-up albumin level.    Depression Continue mirtazapine 7.5 mg p.o. bedtime.   Advance Care Planning:   Code Status: Do not attempt resuscitation (DNR) PRE-ARREST INTERVENTIONS DESIRED   Consults:   Family Communication:   Severity of Illness: The appropriate patient status for this patient is INPATIENT. Inpatient status is judged to be reasonable and necessary in order to provide the required intensity of service to ensure the patient's safety. The patient's presenting symptoms, physical exam findings, and initial radiographic and laboratory data in the context of their chronic comorbidities is felt to place them at high risk for further clinical deterioration. Furthermore, it is not anticipated that the patient will be medically stable for discharge from the hospital within 2 midnights of admission.   * I certify that at the point of admission it is my clinical judgment that the patient will require inpatient hospital care spanning beyond 2 midnights from the point of admission due to high intensity of service, high risk for further deterioration and high frequency of surveillance required.*  Author: Bobette Mo, MD 11/24/2023 3:45 PM  For on call review [email or web address].   This document was prepared using Dragon voice recognition software and may contain some unintended transcription errors.

## 2023-11-24 NOTE — ED Notes (Signed)
This RN called Jamyria Remund(Daughter-in-Law) per Pt's request and gave her an update on Pt being moved upstairs to [address].

## 2023-11-24 NOTE — ED Notes (Signed)
Patient being admitted to stepdown room 1234, from the ED bed WA24, chart reviewed.

## 2023-11-24 NOTE — ED Triage Notes (Signed)
Pt BIBA EMS for shortness of breath, congestion, body aches and chills x 3 days  Pt denies fever, nausea, vomiting, diarrhea  and pain  Pt is A&O x 3 and in no obvious distress at this time.

## 2023-11-24 NOTE — ED Provider Notes (Signed)
[place] EMERGENCY DEPARTMENT AT [facility] Provider Note  CSN: [id number] Arrival date & time: 11/24/23 0935  Chief Complaint(s) Shortness of Breath  HPI Dana [name] is a 88 y.o. female with past medical history as below, significant for osteoporosis,, COPD, GERD, HTN, lung cancer (NSCLC adenocarcinoma chemorads 2010) who presents to the ED with complaint of dyspnea, body aches, nausea, chills x 3 days.  She is feeling unwell x 3 days, body aches, exertional dyspnea, nausea without vomiting, some chills no fevers.  Tolerant p.o. without much difficulty.  Not having any significant chest pain.  Difficulty getting out of bed today secondary to weakness and difficulty breathing.  She does not wear oxygen at home, she is on 2 L nasal cannula.  On ambient air she is hypoxic 88 to 89%.  No recent travel, does have some left lower extremity swelling seems worse than right  Past Medical History Past Medical History:  Diagnosis Date   Anemia    Arthritis    HANDS,  WRISTS   Dyspnea    Dyspnea on exertion    Encounter for therapeutic drug monitoring 10/01/2016   GERD (gastroesophageal reflux disease)    at times   Hemorrhoid    History of anal fissures    History of kidney stones    History of lung cancer ONCOLOGIST--  DR Arbutus Ped--  LAST CT ,  NO RECURRENCE OR METS   DX JAN 2010 --  STAGE IIIA  NON-SMALL CELL ADENOCARCINOMA (RIGHT MIDDLE LOBE)---  S/P  CHEMORADIATIO THERAPY  (COMPLETE 03-21-2009)   Imbalance 06/04/2017   lung ca dx'd 2010   Normal cardiac stress test    2007  PER PT   Osteoporosis    PONV (postoperative nausea and vomiting)    Rash, skin    RIGHT FOREARM/ HAND   Right wrist pain    MASS   Wears glasses    Patient Active Problem List   Diagnosis Date Noted   Hypokalemia 12/26/2022   Hypomagnesemia 12/26/2022   Acute upper respiratory infection 12/26/2022   Protein-calorie malnutrition, severe 12/26/2022   CAP (community acquired pneumonia)  12/25/2022   AKI (acute kidney injury) (HCC) 09/22/2020   Diarrhea 09/22/2020   Hypotension 09/22/2020   C. difficile diarrhea 09/22/2020   Choledocholithiasis 09/21/2020   COPD GOLD 0/ lama/laba responsive 07/11/2019   GERD clinical dx only  07/11/2019   HTN (hypertension) 10/10/2017   Imbalance 06/04/2017   Depression 04/04/2017   Left ankle injury, initial encounter 02/28/2017   Encounter for therapeutic drug monitoring 10/01/2016   DOE (dyspnea on exertion) 07/05/2016   Pleural effusion, right 07/13/2014   Right arm cellulitis 02/03/2014   Cancer of right lung parenchyma (HCC) 11/12/2011   Home Medication(s) Prior to Admission medications   Medication Sig Start Date End Date Taking? Authorizing Provider  acetaminophen (TYLENOL) 650 MG CR tablet Take 650 mg by mouth daily.    [provider]  AMBULATORY NON FORMULARY MEDICATION R knee brace Dispense 1 Dx code: M17.11 Use as needed 09/30/23   Rodolph Bong, MD  b complex vitamins capsule Take 1 capsule by mouth daily.    [provider]  bismuth subsalicylate (PEPTO BISMOL) 262 MG chewable tablet Chew 262 mg by mouth as needed for indigestion or diarrhea or loose stools.    [provider]  Calcium Carbonate-Vit D-Min (CALCIUM 600+D PLUS MINERALS) 600-400 MG-UNIT TABS Take 1 tablet by mouth 2 (two) times daily.    [provider]  Cholecalciferol (  VITAMIN D) 50 MCG (2000 UT) tablet Take 2,000 Units by mouth daily.    [provider]  desonide (DESOWEN) 0.05 % ointment Apply 1 Application topically 2 (two) times daily as needed (scaly skin). 08/12/23   [provider]  erlotinib (TARCEVA) 100 MG tablet Take 1 tablet (100 mg total) by mouth daily. 10/16/23   Si Gaul, MD  hydrocortisone 2.5 % cream Apply 1 application  topically every other day. 05/08/22   [provider]  lactase (LACTAID) 3000 units tablet Take 3,000 Units by mouth daily as needed (when eating  dairy products).    [provider]  loperamide (IMODIUM A-D) 2 MG tablet Take 2 mg by mouth as needed for diarrhea or loose stools.    [provider]  loratadine (CLARITIN) 10 MG tablet Take 10 mg by mouth daily as needed for allergies.    [provider]  mirtazapine (REMERON) 15 MG tablet Take 1 tablet (15 mg total) by mouth at bedtime. Patient taking differently: Take 7.5 mg by mouth at bedtime. 07/18/23   Heilingoetter, Cassandra L, PA-C  Multiple Vitamins-Minerals (HAIR SKIN AND NAILS FORMULA) TABS Take 1 tablet by mouth daily.    [provider]  Multiple Vitamins-Minerals (PRESERVISION AREDS 2 PO) Take 1 tablet by mouth 2 (two) times daily.    [provider]  Polyethyl Glycol-Propyl Glycol (SYSTANE OP) Place 1 drop into both eyes at bedtime.    [provider]  Probiotic Product (PROBIOTIC ACIDOPHILUS) CHEW Chew 1 tablet by mouth daily.    [provider]  tacrolimus (PROGRAF) 1 MG capsule 1 mg See admin instructions. Dissolve 1 mg capsule in 1/2 liter of water soaking a cotton ball and apply to wound twice daily 08/08/22   [provider]  Tiotropium Bromide-Olodaterol (STIOLTO RESPIMAT) 2.5-2.5 MCG/ACT AERS INHALE 2 PUFFS BY MOUTH ONCE DAILY 07/10/22   Nyoka Cowden, MD  vitamin B-12 (CYANOCOBALAMIN) 1000 MCG tablet Take 1,000 mcg by mouth daily.    [provider]                                                                                                                                    Past Surgical History Past Surgical History:  Procedure Laterality Date   ANAL FISSURE REPAIR  07/09/2011   INTERNAL SPHINCTEROTOMY   BENIGN EXCISION LEFT BREAST CENTRAL DUCT  02/1999   BREAST BIOPSY  01/31/2009   benign   BREAST EXCISIONAL BIOPSY Left 2004   benign   CHEST TUBE INSERTION Right 07/14/2014   Procedure: INSERTION PLEURAL DRAINAGE CATHETER RIGHT CHEST;  Surgeon: Delight Ovens, MD;  Location: [facility] OR;   Service: Thoracic;  Laterality: Right;   DIRECT LARYNGOSCOPY WITH RADIAESSE INJECTION Bilateral 08/05/2023   Procedure: SUSPENDED MIRCO DIRECT LARYNGOSCOPY WITH PROLARYN INJECTION;  Surgeon: Christia Reading, MD;  Location: [facility] OR;  Service: ENT;  Laterality: Bilateral;   EXCISION RIGHT WRIST MASS  2012   EXTRACORPOREAL SHOCK WAVE LITHOTRIPSY Left 06/01/2019   Procedure: EXTRACORPOREAL SHOCK WAVE LITHOTRIPSY (ESWL);  Surgeon: Malen Gauze, MD;  Location: [facility] ORS;  Service: Urology;  Laterality: Left;   IR ANGIOGRAM EXTREMITY RIGHT  07/09/2023   IR ANGIOGRAM SELECTIVE EACH ADDITIONAL VESSEL  07/09/2023   IR RADIOLOGIST EVAL & MGMT  05/28/2023   IR US GUIDE VASC ACCESS RIGHT  07/09/2023   KNEE ARTHROSCOPY Right 1999   MASS EXCISION Right 03/11/2014   Procedure: RIGHT WRIST DEEP MASS EXCISION WITH CULTURE AND BIOSPY;  Surgeon: Sharma Covert, MD;  Location: [facility];  Service: Orthopedics;  Laterality: Right;   MICROLARYNGOSCOPY W/VOCAL CORD INJECTION Bilateral 02/08/2021   Procedure: MICROLARYNGOSCOPY WITH VOCAL CORD INJECTION;  Surgeon: Christia Reading, MD;  Location: [facility] OR;  Service: ENT;  Laterality: Bilateral;   REMOVAL OF PLEURAL DRAINAGE CATHETER Right 10/14/2014   Procedure: REMOVAL OF PLEURAL DRAINAGE CATHETER;  Surgeon: Delight Ovens, MD;  Location: [facility] OR;  Service: Thoracic;  Laterality: Right;   TALC PLEURODESIS Right 09/16/2014   Procedure: TALC PLEURADESIS/ slurry;  Surgeon: Delight Ovens, MD;  Location: [facility] OR;  Service: Thoracic;  Laterality: Right;   THORACOSCOPY  01-26-2009   w/   lung/  node biopsy's   THUMB ARTHROSCOPY Left    - removed bone spur   TONSILLECTOMY  AS CHILD   TOTAL ABDOMINAL HYSTERECTOMY W/ BILATERAL SALPINGOOPHORECTOMY  1982   W/  APPENDECTOMY   TRANSTHORACIC ECHOCARDIOGRAM  12-23-2008   NORMAL LV/  EF 65-70%/  MILD MR  &  TR   VAULT SUSPENSION PLUS CYSTOCELE REPAIR WITH GRAFT  06-13-2010   Family History Family History  Problem Relation  Age of Onset   Cancer Father        bladder   Cancer Son        kidney - removed as a teenager    Social History Social History   Tobacco Use   Smoking status: Former    Current packs/day: 0.00    Average packs/day: 1 pack/day for 30.0 years (30.0 ttl pk-yrs)    Types: Cigarettes    Start date: 12/25/1947    Quit date: 12/24/1977    Years since quitting: 45.9   Smokeless tobacco: Never  Vaping Use   Vaping status: Never Used  Substance Use Topics   Alcohol use: No   Drug use: No   Allergies Methotrexate, Clindamycin hcl, Lactose intolerance (gi), Amoxicillin, and Protonix [pantoprazole sodium]  Review of Systems Review of Systems  Constitutional:  Positive for chills and fatigue. Negative for fever.  HENT:  Positive for congestion.   Respiratory:  Positive for cough and shortness of breath.   Cardiovascular:  Positive for leg swelling.  Gastrointestinal:  Positive for nausea. Negative for abdominal pain and vomiting.  Genitourinary:  Negative for difficulty urinating and dysuria.  Neurological:  Negative for syncope and light-headedness.    Physical Exam Vital Signs  I have reviewed the triage vital signs BP 110/68   Pulse (!) 106   Temp 98.1 F (36.7 C)   Resp (!) 27   Wt 47.5 kg   SpO2 98%   BMI 17.99 kg/m  Physical Exam Vitals and nursing note reviewed.  Constitutional:      General: She is not in acute distress.    Appearance: Normal appearance. She is well-developed. She is not ill-appearing.  HENT:     Head: Normocephalic and atraumatic.     Right Ear: External ear normal.  Left Ear: External ear normal.     Nose: Nose normal.     Mouth/Throat:     Mouth: Mucous membranes are moist.  Eyes:     General: No scleral icterus.       Right eye: No discharge.        Left eye: No discharge.  Cardiovascular:     Rate and Rhythm: Normal rate and regular rhythm.     Pulses: Normal pulses.     Heart sounds: Normal heart sounds.  Pulmonary:     Effort:  Pulmonary effort is normal. Tachypnea present. No respiratory distress.     Breath sounds: No stridor. Decreased breath sounds present.  Abdominal:     General: Abdomen is flat. There is no distension.     Palpations: Abdomen is soft.     Tenderness: There is no abdominal tenderness.  Musculoskeletal:     Cervical back: No rigidity.     Right lower leg: Edema present.     Left lower leg: Edema present.     Comments: L>R  Skin:    General: Skin is warm and dry.     Capillary Refill: Capillary refill takes less than 2 seconds.     Coloration: Skin is not cyanotic.  Neurological:     Mental Status: She is alert.  Psychiatric:        Mood and Affect: Mood normal.        Behavior: Behavior normal. Behavior is cooperative.     ED Results and Treatments Labs (all labs ordered are listed, but only abnormal results are displayed) Labs Reviewed  BRAIN NATRIURETIC PEPTIDE - Abnormal; Notable for the following components:      Result Value   B Natriuretic Peptide 163.6 (*)    All other components within normal limits  CBC WITH DIFFERENTIAL/PLATELET - Abnormal; Notable for the following components:   WBC 10.7 (*)    Neutro Abs 9.8 (*)    Lymphs Abs 0.2 (*)    All other components within normal limits  COMPREHENSIVE METABOLIC PANEL - Abnormal; Notable for the following components:   Potassium 3.3 (*)    Glucose, Bld 136 (*)    BUN 24 (*)    Calcium 8.0 (*)    Total Protein 5.6 (*)    Albumin 3.2 (*)    All other components within normal limits  TROPONIN I (HIGH SENSITIVITY) - Abnormal; Notable for the following components:   Troponin I (High Sensitivity) 41 (*)    All other components within normal limits  TROPONIN I (HIGH SENSITIVITY) - Abnormal; Notable for the following components:   Troponin I (High Sensitivity) 42 (*)    All other components within normal limits  RESP PANEL BY RT-PCR (RSV, FLU A&B, COVID)  RVPGX2  APTT  PROTIME-INR  Radiology CT Angio Chest PE W and/or Wo Contrast  Result Date: 11/24/2023 CLINICAL DATA:  Fever and chills. History of non-small-cell lung cancer. * Tracking Code: BO * EXAM: CT ANGIOGRAPHY CHEST WITH CONTRAST TECHNIQUE: Multidetector CT imaging of the chest was performed using the standard protocol during bolus administration of intravenous contrast. Multiplanar CT image reconstructions and MIPs were obtained to evaluate the vascular anatomy. RADIATION DOSE REDUCTION: This exam was performed according to the departmental dose-optimization program which includes automated exposure control, adjustment of the mA and/or kV according to patient size and/or use of iterative reconstruction technique. CONTRAST:  75mL OMNIPAQUE IOHEXOL 350 MG/ML SOLN COMPARISON:  X-ray earlier 11/24/2023. Chest CT with contrast 07/15/2023. FINDINGS: Cardiovascular: Heart is enlarged. Small to moderate pericardial effusion again seen, similar to previous. Coronary artery calcifications are identified. The thoracic aorta has a overall normal course and caliber with scattered atherosclerotic changes. There is a bovine type aortic arch. Breathing motion identified which limits evaluation, nondiagnostic for small and peripheral emboli. There is filling defect however along a segmental vessel of the left lower lobe on series 4, image 90 consistent with a small embolus. No larger or more central emboli identified. Mediastinum/Nodes: Dilated esophagus identified. There is some presumed contrast within the lumen of the esophagus or other high attenuation debris. Wall thickening identified. Small thyroid gland. No specific abnormal lymph node enlargement identified in the axillary region or left hilum. There is confluence abnormal soft tissue throughout the mediastinum which has increased from previous. No discrete nodal enlargement but there could be some enlarged  nodes within the mediastinum. This also could be treatment related. Lungs/Pleura: Significant breathing motion. Left lung has a trace pleural fluid. No pneumothorax. Patchy parenchymal nodular opacity along the anterior left upper lobe is similar today measuring 15 mm. Series 100, image 52. Additional small nodule in the left lung apex on series 100, image 12 is stable with some surrounding ground-glass. Small right pleural effusion, increased from previous with some pleural thickening. There is significant increase in parenchymal opacity seen in the right lower lobe extending to the hilum. Persistent bandlike changes along the margin of the right side of the mediastinum diffusely with bronchiectasis and distortion. This could be posttreatment related. Areas of interstitial septal thickening in the right hemithorax. No pneumothorax. Stable high density along the pleural region of the right lung base. Upper Abdomen: Stones in the gallbladder dependently. Preserved adrenal glands. Musculoskeletal: Curvature of the spine with degenerative changes. Increasing anasarca. Review of the MIP images confirms the above findings. Critical Value/emergent results were called by telephone at the time of interpretation on 11/24/2023 at 12:10 pm PST to provider Tanda Rockers , who verbally acknowledged these results. IMPRESSION: Segmental small left lower lobe pulmonary embolism. Increasing edema and soft tissue thickening throughout the mediastinum. Increasing anasarca There is also increasing parenchymal opacity in the right lower lobe. This has a differential including infiltrate, however recommend short follow up to exclude a more aggressive process with the patient's history. Slight increase in tiny right effusion as well. Stable areas of nodularity left upper lobe. Recommend continued surveillance. Enlarged heart with stable pericardial effusion. Dilated esophagus with some luminal high density material. Aortic Atherosclerosis  (ICD10-I70.0) and Emphysema (ICD10-J43.9). Electronically Signed   By: Karen Kays M.D.   On: 11/24/2023 15:19   DG Chest Port 1 View  Result Date: 11/24/2023 CLINICAL DATA:  Shortness of breath, body aches and chills. History of prior right thoracotomy and talc pleurodesis for pleural effusion in 2015. EXAM:  PORTABLE CHEST 1 VIEW COMPARISON:  08/24/2023 FINDINGS: Stable cardiac enlargement. Stable chronic pleural thickening and parenchymal density at the right lung base likely related to prior pleurodesis and scarring. Component of acute infection involving the right lower lung would be very difficult to exclude. No pulmonary edema or pneumothorax identified. The visualized skeletal structures are unremarkable. IMPRESSION: Stable chronic pleural thickening and parenchymal density at the right lung base likely related to prior pleurodesis and scarring. Component of acute infection involving the right lower lung would be very difficult to exclude. Stable cardiac enlargement. Electronically Signed   By: Irish Lack M.D.   On: 11/24/2023 11:07    Pertinent labs & imaging results that were available during my care of the patient were reviewed by me and considered in my medical decision making (see MDM for details).  Medications Ordered in ED Medications  azithromycin (ZITHROMAX) 500 mg in sodium chloride 0.9 % 250 mL IVPB (has no administration in time range)  sodium chloride 0.9 % bolus 500 mL (0 mLs Intravenous Stopped 11/24/23 1400)  iohexol (OMNIPAQUE) 350 MG/ML injection 75 mL (75 mLs Intravenous Contrast Given 11/24/23 1426)  potassium chloride SA (KLOR-CON M) CR tablet 40 mEq (40 mEq Oral Given 11/24/23 1406)  cefTRIAXone (ROCEPHIN) 1 g in sodium chloride 0.9 % 100 mL IVPB (0 g Intravenous Stopped 11/24/23 1439)                                                                                                                                     Procedures .Critical Care  Performed by: Sloan Leiter, DO Authorized by: Sloan Leiter, DO   Critical care provider statement:    Critical care time (minutes):  45   Critical care time was exclusive of:  Separately billable procedures and treating other patients   Critical care was necessary to treat or prevent imminent or life-threatening deterioration of the following conditions:  Respiratory failure and circulatory failure   Critical care was time spent personally by me on the following activities:  Development of treatment plan with patient or surrogate, discussions with consultants, evaluation of patient's response to treatment, examination of patient, ordering and review of laboratory studies, ordering and review of radiographic studies, ordering and performing treatments and interventions, pulse oximetry, re-evaluation of patient's condition, review of old charts and obtaining history from patient or surrogate   Care discussed with: admitting provider     (including critical care time)  Medical Decision Making / ED Course    Medical Decision Making:    ANIYLA HALLING is a 88 y.o. female with past medical history as below, significant for osteoporosis,, COPD, GERD, HTN, lung cancer (NSCLC adenocarcinoma chemorads 2010) who presents to the ED with complaint of dyspnea, body aches, nausea, chills x 3 days.. The complaint involves an extensive differential diagnosis and also carries with it a high risk of complications and morbidity.  Serious etiology was  considered. Ddx includes but is not limited to: In my evaluation of this patient's dyspnea my DDx includes, but is not limited to, pneumonia, pulmonary embolism, pneumothorax, pulmonary edema, metabolic acidosis, asthma, COPD, cardiac cause, anemia, anxiety, etc.    Complete initial physical exam performed, notably the patient was in no acute distress, requiring supplemental oxygen.    Reviewed and confirmed nursing documentation for past medical history, family history, social  history.  Vital signs reviewed.    Patient with ongoing symptoms x 3 days, dyspnea, body aches, chills, nausea.  Hypoxia on room air.  Screening labs, check CT imaging of the chest given elevated well score  Clinical Course as of 11/24/23 1541  Sun Nov 24, 2023  1204 Troponin I (High Sensitivity)(!): 41 Similar to prior  [SG]  1324 Potassium(!): 3.3 Replace orally [SG]  1324 Resp panel by RT-PCR (RSV, Flu A&B, Covid) Anterior Nasal Swab negative [SG]  1325 DG Chest Port 1 View Stable chest but unable to rule out right sided infiltrate  She is having URI symptoms, hypoxia, will start rocephin, CT chest pending  [SG]  1515 Spoke with radiology, CT PE concerning for single segmental PE, possible pneumonia.  Continue plan for admission [SG]    Clinical Course User Index [SG] Sloan Leiter, DO    Brief summary: 88 year old female who with URI symptoms, hypoxia on arrival.  Labs stable although troponin is elevated but flat; chronically elevated.  EKG without acute ischemic changes.  Imaging concerning for PE and pneumonia.  Start broad-spectrum antibiotics, start heparin.  Continue supplemental O2. Plan admission. Will admit [facility]                 Additional history obtained: -Additional history obtained from na -External records from outside source obtained and reviewed including: Chart review including previous notes, labs, imaging, consultation notes including  Home medications Primary care documentation    Lab Tests: -I ordered, reviewed, and interpreted labs.   The pertinent results include:   Labs Reviewed  BRAIN NATRIURETIC PEPTIDE - Abnormal; Notable for the following components:      Result Value   B Natriuretic Peptide 163.6 (*)    All other components within normal limits  CBC WITH DIFFERENTIAL/PLATELET - Abnormal; Notable for the following components:   WBC 10.7 (*)    Neutro Abs 9.8 (*)    Lymphs Abs 0.2 (*)    All other components within normal limits   COMPREHENSIVE METABOLIC PANEL - Abnormal; Notable for the following components:   Potassium 3.3 (*)    Glucose, Bld 136 (*)    BUN 24 (*)    Calcium 8.0 (*)    Total Protein 5.6 (*)    Albumin 3.2 (*)    All other components within normal limits  TROPONIN I (HIGH SENSITIVITY) - Abnormal; Notable for the following components:   Troponin I (High Sensitivity) 41 (*)    All other components within normal limits  TROPONIN I (HIGH SENSITIVITY) - Abnormal; Notable for the following components:   Troponin I (High Sensitivity) 42 (*)    All other components within normal limits  RESP PANEL BY RT-PCR (RSV, FLU A&B, COVID)  RVPGX2  APTT  PROTIME-INR    Notable for: Elevated, potassium mildly low  EKG   EKG Interpretation Date/Time:  Sunday November 24 2023 09:48:54 EST Ventricular Rate:  117 PR Interval:  162 QRS Duration:  80 QT Interval:  315 QTC Calculation: 440 R Axis:   -52  Text Interpretation: Sinus tachycardia  Probable left atrial enlargement Left anterior fascicular block Confirmed by Tanda Rockers ([id number]) on 11/24/2023 1:25:22 PM         Imaging Studies ordered: I ordered imaging studies including x-ray, CT PE I independently visualized the following imaging with scope of interpretation limited to determining acute life threatening conditions related to emergency care; findings noted above I independently visualized and interpreted imaging. I agree with the radiologist interpretation   Medicines ordered and prescription drug management: Meds ordered this encounter  Medications   sodium chloride 0.9 % bolus 500 mL   iohexol (OMNIPAQUE) 350 MG/ML injection 75 mL   potassium chloride SA (KLOR-CON M) CR tablet 40 mEq   cefTRIAXone (ROCEPHIN) 1 g in sodium chloride 0.9 % 100 mL IVPB    Order Specific Question:   Antibiotic Indication:    Answer:   CAP   azithromycin (ZITHROMAX) 500 mg in sodium chloride 0.9 % 250 mL IVPB    -I have reviewed the patients home medicines  and have made adjustments as needed   Consultations Obtained: na   Cardiac Monitoring: The patient was maintained on a cardiac monitor.  I personally viewed and interpreted the cardiac monitored which showed an underlying rhythm of: NSR Continuous pulse oximetry interpreted by myself, 98% on 2L.    Social Determinants of Health:  Diagnosis or treatment significantly limited by social determinants of health: former smoker   Reevaluation: After the interventions noted above, I reevaluated the patient and found that they have improved  Co morbidities that complicate the patient evaluation  Past Medical History:  Diagnosis Date   Anemia    Arthritis    HANDS,  WRISTS   Dyspnea    Dyspnea on exertion    Encounter for therapeutic drug monitoring 10/01/2016   GERD (gastroesophageal reflux disease)    at times   Hemorrhoid    History of anal fissures    History of kidney stones    History of lung cancer ONCOLOGIST--  DR Arbutus Ped--  LAST CT ,  NO RECURRENCE OR METS   DX JAN 2010 --  STAGE IIIA  NON-SMALL CELL ADENOCARCINOMA (RIGHT MIDDLE LOBE)---  S/P  CHEMORADIATIO THERAPY  (COMPLETE 03-21-2009)   Imbalance 06/04/2017   lung ca dx'd 2010   Normal cardiac stress test    2007  PER PT   Osteoporosis    PONV (postoperative nausea and vomiting)    Rash, skin    RIGHT FOREARM/ HAND   Right wrist pain    MASS   Wears glasses       Dispostion: Disposition decision including need for hospitalization was considered, and patient admitted to the hospital.    Final Clinical Impression(s) / ED Diagnoses Final diagnoses:  Hypoxia  Community acquired pneumonia, unspecified laterality  Other acute pulmonary embolism without acute cor pulmonale (HCC)        Sloan Leiter, DO 11/24/23 1541

## 2023-11-25 ENCOUNTER — Telehealth (HOSPITAL_COMMUNITY): Payer: Self-pay | Admitting: Pharmacy Technician

## 2023-11-25 ENCOUNTER — Other Ambulatory Visit (HOSPITAL_COMMUNITY): Payer: Self-pay

## 2023-11-25 ENCOUNTER — Inpatient Hospital Stay (HOSPITAL_COMMUNITY): Payer: Medicare Other

## 2023-11-25 DIAGNOSIS — K219 Gastro-esophageal reflux disease without esophagitis: Secondary | ICD-10-CM

## 2023-11-25 DIAGNOSIS — I10 Essential (primary) hypertension: Secondary | ICD-10-CM

## 2023-11-25 DIAGNOSIS — I2609 Other pulmonary embolism with acute cor pulmonale: Secondary | ICD-10-CM | POA: Diagnosis not present

## 2023-11-25 DIAGNOSIS — R7989 Other specified abnormal findings of blood chemistry: Secondary | ICD-10-CM

## 2023-11-25 DIAGNOSIS — E876 Hypokalemia: Secondary | ICD-10-CM | POA: Diagnosis not present

## 2023-11-25 DIAGNOSIS — J069 Acute upper respiratory infection, unspecified: Secondary | ICD-10-CM

## 2023-11-25 DIAGNOSIS — J9601 Acute respiratory failure with hypoxia: Secondary | ICD-10-CM | POA: Diagnosis not present

## 2023-11-25 LAB — COMPREHENSIVE METABOLIC PANEL
ALT: 23 U/L (ref 0–44)
AST: 24 U/L (ref 15–41)
Albumin: 3 g/dL — ABNORMAL LOW (ref 3.5–5.0)
Alkaline Phosphatase: 41 U/L (ref 38–126)
Anion gap: 12 (ref 5–15)
BUN: 26 mg/dL — ABNORMAL HIGH (ref 8–23)
CO2: 18 mmol/L — ABNORMAL LOW (ref 22–32)
Calcium: 8 mg/dL — ABNORMAL LOW (ref 8.9–10.3)
Chloride: 109 mmol/L (ref 98–111)
Creatinine, Ser: 0.8 mg/dL (ref 0.44–1.00)
GFR, Estimated: >60 mL/min (ref >60)
Glucose, Bld: 157 mg/dL — ABNORMAL HIGH (ref 70–99)
Potassium: 4 mmol/L (ref 3.5–5.1)
Sodium: 139 mmol/L (ref 135–145)
Total Bilirubin: 0.6 mg/dL (ref <1.2)
Total Protein: 5.5 g/dL — ABNORMAL LOW (ref 6.5–8.1)

## 2023-11-25 LAB — CBC
HCT: 34.7 % — ABNORMAL LOW (ref 36.0–46.0)
Hemoglobin: 11.4 g/dL — ABNORMAL LOW (ref 12.0–15.0)
MCH: 31.7 pg (ref 26.0–34.0)
MCHC: 32.9 g/dL (ref 30.0–36.0)
MCV: 96.4 fL (ref 80.0–100.0)
Platelets: 204 10*3/uL (ref 150–400)
RBC: 3.6 MIL/uL — ABNORMAL LOW (ref 3.87–5.11)
RDW: 15.5 % (ref 11.5–15.5)
WBC: 12.7 10*3/uL — ABNORMAL HIGH (ref 4.0–10.5)
nRBC: 0 % (ref 0.0–0.2)

## 2023-11-25 LAB — MAGNESIUM: Magnesium: 2 mg/dL (ref 1.7–2.4)

## 2023-11-25 LAB — ECHOCARDIOGRAM COMPLETE
Area-P 1/2: 3.74 cm2
Est EF: >75
Height: 64 in
S' Lateral: 1.9 cm
Weight: 1661.39 [oz_av]

## 2023-11-25 LAB — HEPARIN LEVEL (UNFRACTIONATED)
Heparin Unfractionated: 0.41 [IU]/mL (ref 0.30–0.70)
Heparin Unfractionated: 0.44 [IU]/mL (ref 0.30–0.70)

## 2023-11-25 LAB — PHOSPHORUS: Phosphorus: 2.5 mg/dL (ref 2.5–4.6)

## 2023-11-25 LAB — STREP PNEUMONIAE URINARY ANTIGEN: Strep Pneumo Urinary Antigen: NEGATIVE

## 2023-11-25 MED ORDER — APIXABAN 5 MG PO TABS
10.0000 mg | ORAL_TABLET | Freq: Two times a day (BID) | ORAL | Status: DC
  Administered 2023-11-25 – 2023-11-27 (×4): 10 mg via ORAL
  Filled 2023-11-25 (×4): qty 2

## 2023-11-25 MED ORDER — APIXABAN 5 MG PO TABS: 5.0000 mg | ORAL_TABLET | Freq: Two times a day (BID) | ORAL | Status: DC

## 2023-11-25 NOTE — Progress Notes (Signed)
PHARMACY - ANTICOAGULATION CONSULT NOTE  Pharmacy Consult for Apixaban Indication: pulmonary embolus  Allergies  Allergen Reactions   Methotrexate Shortness Of Breath   Clindamycin Hcl Diarrhea and Other (See Comments)   Lactose Intolerance (Gi)     Upset stomach   Amoxicillin Diarrhea    Hx of C Diff    Protonix [Pantoprazole Sodium] Rash    Patient Measurements: Height: 5\' 4"  (162.6 cm) Weight: 47.1 kg (103 lb 13.4 oz) IBW/kg (Calculated) : 54.7  Vital Signs: Temp: 97.6 F (36.4 C) (12/02 1200) Temp Source: Oral (12/02 1200) BP: 93/47 (12/02 1200) Pulse Rate: 118 (12/02 1300)  Labs: Recent Labs    11/24/23 1040 11/24/23 1405 11/24/23 1540 11/24/23 1645 11/25/23 0134 11/25/23 0839  HGB 12.3  --   --   --  11.4*  --   HCT 37.8  --   --   --  34.7*  --   PLT 237  --   --   --  204  --   APTT  --   --   --  22*  --   --   LABPROT  --   --  15.0  --   --   --   INR  --   --  1.2  --   --   --   HEPARINUNFRC  --   --   --   --  0.41 0.44  CREATININE 0.80  --   --   --  0.80  --   TROPONINIHS 41* 42*  --   --   --   --     Estimated Creatinine Clearance: 37.5 mL/min (by C-G formula based on SCr of 0.8 mg/dL).   Medical History: Past Medical History:  Diagnosis Date   Anemia    Arthritis    HANDS,  WRISTS   CAP (community acquired pneumonia) 12/25/2022   COPD GOLD 0/ lama/laba responsive 07/11/2019   Quit smoking 1979  07/05/2016  Walked RA x 3 laps @ 185 ft each stopped due to  End of study, nl pace no desat or sob   - Spirometry 07/05/2016  wnl p am spiriva   - CTa chest 07/05/2016 neg pe/ neg ILD, R infrahilar density c/w lung ca vs post RT scar    Chest CT w contrast  09/12/18  Stable CT of the chest  - 11/25/2018   Walked RA  2 laps @ 287ft each @ moderate pace  stopped due to  desats to 86% c   Depression 04/04/2017   Dyspnea    Dyspnea on exertion    Encounter for therapeutic drug monitoring 10/01/2016   GERD (gastroesophageal reflux disease)    at times    Heart failure with preserved left ventricular function (HFpEF) (HCC) 11/24/2023   Hemorrhoid    History of anal fissures    History of kidney stones    History of lung cancer ONCOLOGIST--  DR Arbutus Ped--  LAST CT ,  NO RECURRENCE OR METS   DX JAN 2010 --  STAGE IIIA  NON-SMALL CELL ADENOCARCINOMA (RIGHT MIDDLE LOBE)---  S/P  CHEMORADIATIO THERAPY  (COMPLETE 03-21-2009)   HTN (hypertension) 10/10/2017   Imbalance 06/04/2017   lung ca dx'd 2010   Normal cardiac stress test    2007  PER PT   Osteoporosis    PONV (postoperative nausea and vomiting)    Rash, skin    RIGHT FOREARM/ HAND   Right wrist pain    MASS   Wears  glasses     Medications:  No prior to admission anticoagulation meds listed 12/1-12/2 IV heparin  Assessment: Pharmacy consulted to transition patient from IV heparin to apixaban for this 88 yo female admitted with new PE in the setting of right lung cancer.  IV heparin initiated yesterday and today's heparin level therapeutic.   Goal of Therapy:  Treat PE Monitor platelets by anticoagulation protocol: Yes   Plan:  Discontinue IV heparin and associated labs At the time IV heparin is stopped, start Apixaban 10 mg twice daily for 7 days followed by 5 mg twice daily With no dose adjustments anticipated, Pharmacy will sign off on consult and continue to monitor CBC as ordered by provider and signs/symptoms of bleeding   Thank you for allowing pharmacy to be a part of this patient's care.  Selinda Eon, PharmD, BCPS Clinical Pharmacist [place] 11/25/2023 4:22 PM

## 2023-11-25 NOTE — Plan of Care (Signed)

## 2023-11-25 NOTE — Telephone Encounter (Signed)
Patient Product/process development scientist completed.    The patient is insured through [facility]. Patient has Medicare and is not eligible for a copay card, but may be able to apply for patient assistance, if available.    Ran test claim for Eliquis 5 mg and the current 30 day co-pay is $149.53 due to being in Coverage Gap (donut hole).  Ran test claim for Xarelto 20 mg and the current 30 day co-pay is $143.30 due to being in Coverage Gap (donut hole).  This test claim was processed through [facility]- copay amounts may vary at other pharmacies due to pharmacy/plan contracts, or as the patient moves through the different stages of their insurance plan.     Dana [name], CPHT Pharmacy Technician III Certified Patient Advocate [facility] Patient Advocate Team Direct Number: [phone]  Fax: [phone]

## 2023-11-25 NOTE — Progress Notes (Signed)
Patient arrived to the floor via wheelchair. Heparin drip running @7 .20mLs/hr. VS taken, BP elevated. Oncoming nurse made aware. Patient in bed, tele and bed alarm on.

## 2023-11-25 NOTE — Discharge Instructions (Addendum)
Information on my medicine - ELIQUIS (apixaban)  This medication education was reviewed with me or my healthcare representative as part of my discharge preparation.    Why was Eliquis prescribed for you? Eliquis was prescribed to treat blood clots that may have been found in the veins of your legs (deep vein thrombosis) or in your lungs (pulmonary embolism) and to reduce the risk of them occurring again.  What do You need to know about Eliquis ? The starting dose is 10 mg (two 5 mg tablets) taken TWICE daily for the FIRST SEVEN (7) DAYS, then on 01/21/2023  the dose is reduced to ONE 5 mg tablet taken TWICE daily.  Eliquis may be taken with or without food.   Try to take the dose about the same time in the morning and in the evening. If you have difficulty swallowing the tablet whole please discuss with your pharmacist how to take the medication safely.  Take Eliquis exactly as prescribed and DO NOT stop taking Eliquis without talking to the doctor who prescribed the medication.  Stopping may increase your risk of developing a new blood clot.  Refill your prescription before you run out.  After discharge, you should have regular check-up appointments with your healthcare provider that is prescribing your Eliquis.    What do you do if you miss a dose? If a dose of ELIQUIS is not taken at the scheduled time, take it as soon as possible on the same day and twice-daily administration should be resumed. The dose should not be doubled to make up for a missed dose.  Important Safety Information A possible side effect of Eliquis is bleeding. You should call your healthcare provider right away if you experience any of the following: Bleeding from an injury or your nose that does not stop. Unusual colored urine (red or dark brown) or unusual colored stools (red or black). Unusual bruising for unknown reasons. A serious fall or if you hit your head (even if there is no bleeding).  Some  medicines may interact with Eliquis and might increase your risk of bleeding or clotting while on Eliquis. To help avoid this, consult your healthcare provider or pharmacist prior to using any new prescription or non-prescription medications, including herbals, vitamins, non-steroidal anti-inflammatory drugs (NSAIDs) and supplements.  This website has more information on Eliquis (apixaban): http://[email or web address].com/eliquis/home

## 2023-11-25 NOTE — Progress Notes (Signed)
PHARMACY - ANTICOAGULATION CONSULT NOTE  Pharmacy Consult for IV heparin Indication: pulmonary embolus  Allergies  Allergen Reactions   Methotrexate Shortness Of Breath   Clindamycin Hcl Diarrhea and Other (See Comments)   Lactose Intolerance (Gi)     Upset stomach   Amoxicillin Diarrhea    Hx of C Diff    Protonix [Pantoprazole Sodium] Rash    Patient Measurements: Height: 5\' 4"  (162.6 cm) Weight: 47.1 kg (103 lb 13.4 oz) IBW/kg (Calculated) : 54.7 Heparin Dosing Weight: total body weight   Vital Signs: Temp: 97.2 F (36.2 C) (12/01 2349) Temp Source: Oral (12/01 2349) BP: 91/51 (12/02 0100) Pulse Rate: 89 (12/02 0100)  Labs: Recent Labs    11/24/23 1040 11/24/23 1405 11/24/23 1540 11/24/23 1645 11/25/23 0134  HGB 12.3  --   --   --  11.4*  HCT 37.8  --   --   --  34.7*  PLT 237  --   --   --  204  APTT  --   --   --  22*  --   LABPROT  --   --  15.0  --   --   INR  --   --  1.2  --   --   HEPARINUNFRC  --   --   --   --  0.41  CREATININE 0.80  --   --   --   --   TROPONINIHS 41* 42*  --   --   --     Estimated Creatinine Clearance: 37.5 mL/min (by C-G formula based on SCr of 0.8 mg/dL).   Medical History: Past Medical History:  Diagnosis Date   Anemia    Arthritis    HANDS,  WRISTS   CAP (community acquired pneumonia) 12/25/2022   COPD GOLD 0/ lama/laba responsive 07/11/2019   Quit smoking 1979  07/05/2016  Walked RA x 3 laps @ 185 ft each stopped due to  End of study, nl pace no desat or sob   - Spirometry 07/05/2016  wnl p am spiriva   - CTa chest 07/05/2016 neg pe/ neg ILD, R infrahilar density c/w lung ca vs post RT scar    Chest CT w contrast  09/12/18  Stable CT of the chest  - 11/25/2018   Walked RA  2 laps @ 252ft each @ moderate pace  stopped due to  desats to 86% c   Depression 04/04/2017   Dyspnea    Dyspnea on exertion    Encounter for therapeutic drug monitoring 10/01/2016   GERD (gastroesophageal reflux disease)    at times   Heart failure  with preserved left ventricular function (HFpEF) (HCC) 11/24/2023   Hemorrhoid    History of anal fissures    History of kidney stones    History of lung cancer ONCOLOGIST--  DR Arbutus Ped--  LAST CT ,  NO RECURRENCE OR METS   DX JAN 2010 --  STAGE IIIA  NON-SMALL CELL ADENOCARCINOMA (RIGHT MIDDLE LOBE)---  S/P  CHEMORADIATIO THERAPY  (COMPLETE 03-21-2009)   HTN (hypertension) 10/10/2017   Imbalance 06/04/2017   lung ca dx'd 2010   Normal cardiac stress test    2007  PER PT   Osteoporosis    PONV (postoperative nausea and vomiting)    Rash, skin    RIGHT FOREARM/ HAND   Right wrist pain    MASS   Wears glasses     Medications:  No anticoagulants PTA  Assessment: 43 y/oF with PMH of  NSCLC who presents with dyspnea, body aches, nausea, chills x 3 days. CTa chest + for segmental small left lower lobe pulmonary embolism. Pharmacy consulted for IV heparin dosing. CBC: Hgb/Hct WNL, Plt WNL. Baseline aPTT 22  11/25/2023 HL 0.41 therapeutic on 750 units/hr Hgb 11.4, plts 204 No bleeding noted  Goal of Therapy:  Heparin level 0.3-0.7 units/ml Monitor platelets by anticoagulation protocol: Yes   Plan:  continue heparin infusion at 750 units/hr Confirmatory heparin level in 8 hours Daily CBC, heparin level Monitor closely for s/sx of bleeding   Arley Phenix RPh 11/25/2023, 2:12 AM

## 2023-11-25 NOTE — Progress Notes (Signed)
[facility]                                                                               Dana [name], is a 88 y.o. female, DOB - 1935/09/25, WUJ:[id number] Admit date - 11/24/2023    Outpatient Primary MD for the patient is Gweneth Dimitri, MD  LOS - 1  days    Brief summary    [name] is a 88 y.o. female with medical history significant of anemia, osteoarthritis, rheumatoid arthritis, dyspnea on exertion, depression, GERD, hemorrhoids, anal fissures, nephrolithiasis, osteoporosis, skin rash, hypertension, COPD, history of pneumonia, history of lung cancer, history of radiation pneumonitis who is coming to the emergency department due to progressively worse dyspnea associated with productive cough, wheezing, fatigue, chills, body aches, nausea and decreased appetite since 3 days ago .  CTA chest showing segmental small left lower lobe pulmonary embolism. Increasing edema and soft tissue thickening throughout the mediastinum. Increasing anasarca. Also increase in parenchymal opacity in the right lower lobe. This could be an infiltrate, however short-term follow-up recommended to exclude a more aggressive process with the patient's history.   Assessment & Plan    Assessment and Plan:   Community-acquired pneumonia in the setting of COPD Continue with IV ceftriaxone and azithromycin. Check urine streptococcal pneumonia antigen. Follow sputum cultures and blood cultures.   Newly diagnosed pulmonary embolism in the setting of right lung cancer Patient started on IV heparin Transition to Eliquis on discharge Get echocardiogram for further evaluation. Check lower extremity duplex.    Elevated troponins probably secondary to demand ischemia from respiratory illness.   Hypokalemia and hypomagnesemia Replaced   Hypertension Blood pressure parameters are actually on borderline low, continue to monitor.    GERD Continue with PPI         RN Pressure  Injury Documentation: Pressure Injury 12/26/22 Hand Posterior;Right skin issue (Active)  12/26/22 0100  Location: Hand  Location Orientation: Posterior;Right  Staging:   Wound Description (Comments): skin issue  Present on Admission: Yes    Estimated body mass index is 17.82 kg/m as calculated from the following:   Height as of this encounter: 5\' 4"  (1.626 m).   Weight as of this encounter: 47.1 kg.  Code Status: DNR DVT Prophylaxis:  Heparin   Level of Care: Level of care: Stepdown Family Communication: none at bedside.   Disposition Plan:     Remains inpatient appropriate:  IV heparin.   Procedures:  None.   Consultants:   None.   Antimicrobials:   Anti-infectives (From admission, onward)    Start     Dose/Rate Route Frequency Ordered Stop   11/25/23 1600  azithromycin (ZITHROMAX) 500 mg in sodium chloride 0.9 % 250 mL IVPB        500 mg 250 mL/hr over 60 Minutes Intravenous Every 24 hours 11/24/23 1703 11/30/23 1559   11/25/23 1400  cefTRIAXone (ROCEPHIN) 1 g in sodium chloride 0.9 % 100 mL IVPB        1 g 200 mL/hr over 30 Minutes Intravenous Every 24 hours 11/24/23 1703 11/30/23 1359   11/24/23 1515  azithromycin (ZITHROMAX) 500 mg in sodium chloride 0.9 % 250 mL  IVPB        500 mg 250 mL/hr over 60 Minutes Intravenous  Once 11/24/23 1514 11/24/23 1824   11/24/23 1330  cefTRIAXone (ROCEPHIN) 1 g in sodium chloride 0.9 % 100 mL IVPB        1 g 200 mL/hr over 30 Minutes Intravenous  Once 11/24/23 1326 11/24/23 1439        Medications  Scheduled Meds:  albuterol  2.5 mg Nebulization BID   arformoterol  15 mcg Nebulization BID   And   umeclidinium bromide  1 puff Inhalation Daily   calcium carbonate  1 tablet Oral BID   Chlorhexidine Gluconate Cloth  6 each Topical Daily   cholecalciferol  2,000 Units Oral Daily   cyanocobalamin  1,000 mcg Oral Daily   mirtazapine  7.5 mg Oral QHS   Continuous Infusions:  azithromycin     cefTRIAXone (ROCEPHIN)  IV  1 g (11/25/23 1533)   heparin 750 Units/hr (11/25/23 1200)   PRN Meds:.acetaminophen **OR** acetaminophen, albuterol, lactase, ondansetron **OR** ondansetron (ZOFRAN) IV, mouth rinse    Subjective:   Dana [name] was seen and examined today.  Reports feeling much better.   Objective:   Vitals:   11/25/23 1000 11/25/23 1100 11/25/23 1200 11/25/23 1300  BP: (!) 87/46 103/86 (!) 93/47   Pulse: (!) 109 (!) 122 (!) 102 (!) 118  Resp: (!) 27 (!) 23 (!) 26 19  Temp:   97.6 F (36.4 C)   TempSrc:   Oral   SpO2: 100% 100% 97% 100%  Weight:      Height:        Intake/Output Summary (Last 24 hours) at 11/25/2023 1605 Last data filed at 11/25/2023 1200 Gross per 24 hour  Intake 697.21 ml  Output 150 ml  Net 547.21 ml   Filed Weights   11/24/23 1535 11/24/23 2230  Weight: 47.5 kg 47.1 kg     Exam General: Alert and oriented x 3, NAD Cardiovascular: S1 S2 auscultated, no murmurs, RRR Respiratory: Clear to auscultation bilaterally, no wheezing, rales or rhonchi Gastrointestinal: Soft, nontender, nondistended, + bowel sounds Ext: no pedal edema bilaterally Neuro: AAOx3, Cr N's II- XII. Strength 5/5 upper and lower extremities bilaterally Skin: No rashes Psych: Normal affect and demeanor, alert and oriented x3    Data Reviewed:  I have personally reviewed following labs and imaging studies   CBC Lab Results  Component Value Date   WBC 12.7 (H) 11/25/2023   RBC 3.60 (L) 11/25/2023   HGB 11.4 (L) 11/25/2023   HCT 34.7 (L) 11/25/2023   MCV 96.4 11/25/2023   MCH 31.7 11/25/2023   PLT 204 11/25/2023   MCHC 32.9 11/25/2023   RDW 15.5 11/25/2023   LYMPHSABS 0.2 (L) 11/24/2023   MONOABS 0.6 11/24/2023   EOSABS 0.0 11/24/2023   BASOSABS 0.0 11/24/2023     Last metabolic panel Lab Results  Component Value Date   NA 139 11/25/2023   K 4.0 11/25/2023   CL 109 11/25/2023   CO2 18 (L) 11/25/2023   BUN 26 (H) 11/25/2023   CREATININE 0.80 11/25/2023   GLUCOSE 157 (H)  11/25/2023   GFRNONAA >60 11/25/2023   GFRAA >60 09/26/2020   CALCIUM 8.0 (L) 11/25/2023   PHOS 2.5 11/25/2023   PROT 5.5 (L) 11/25/2023   ALBUMIN 3.0 (L) 11/25/2023   BILITOT 0.6 11/25/2023   ALKPHOS 41 11/25/2023   AST 24 11/25/2023   ALT 23 11/25/2023   ANIONGAP 12 11/25/2023    CBG (last  3)  No results for input(s): "GLUCAP" in the last 72 hours.    Coagulation Profile: Recent Labs  Lab 11/24/23 1540  INR 1.2     Radiology Studies: CT Angio Chest PE W and/or Wo Contrast  Result Date: 11/24/2023 CLINICAL DATA:  Fever and chills. History of non-small-cell lung cancer. * Tracking Code: BO * EXAM: CT ANGIOGRAPHY CHEST WITH CONTRAST TECHNIQUE: Multidetector CT imaging of the chest was performed using the standard protocol during bolus administration of intravenous contrast. Multiplanar CT image reconstructions and MIPs were obtained to evaluate the vascular anatomy. RADIATION DOSE REDUCTION: This exam was performed according to the departmental dose-optimization program which includes automated exposure control, adjustment of the mA and/or kV according to patient size and/or use of iterative reconstruction technique. CONTRAST:  75mL OMNIPAQUE IOHEXOL 350 MG/ML SOLN COMPARISON:  X-ray earlier 11/24/2023. Chest CT with contrast 07/15/2023. FINDINGS: Cardiovascular: Heart is enlarged. Small to moderate pericardial effusion again seen, similar to previous. Coronary artery calcifications are identified. The thoracic aorta has a overall normal course and caliber with scattered atherosclerotic changes. There is a bovine type aortic arch. Breathing motion identified which limits evaluation, nondiagnostic for small and peripheral emboli. There is filling defect however along a segmental vessel of the left lower lobe on series 4, image 90 consistent with a small embolus. No larger or more central emboli identified. Mediastinum/Nodes: Dilated esophagus identified. There is some presumed contrast  within the lumen of the esophagus or other high attenuation debris. Wall thickening identified. Small thyroid gland. No specific abnormal lymph node enlargement identified in the axillary region or left hilum. There is confluence abnormal soft tissue throughout the mediastinum which has increased from previous. No discrete nodal enlargement but there could be some enlarged nodes within the mediastinum. This also could be treatment related. Lungs/Pleura: Significant breathing motion. Left lung has a trace pleural fluid. No pneumothorax. Patchy parenchymal nodular opacity along the anterior left upper lobe is similar today measuring 15 mm. Series 100, image 52. Additional small nodule in the left lung apex on series 100, image 12 is stable with some surrounding ground-glass. Small right pleural effusion, increased from previous with some pleural thickening. There is significant increase in parenchymal opacity seen in the right lower lobe extending to the hilum. Persistent bandlike changes along the margin of the right side of the mediastinum diffusely with bronchiectasis and distortion. This could be posttreatment related. Areas of interstitial septal thickening in the right hemithorax. No pneumothorax. Stable high density along the pleural region of the right lung base. Upper Abdomen: Stones in the gallbladder dependently. Preserved adrenal glands. Musculoskeletal: Curvature of the spine with degenerative changes. Increasing anasarca. Review of the MIP images confirms the above findings. Critical Value/emergent results were called by telephone at the time of interpretation on 11/24/2023 at 12:10 pm PST to provider Tanda Rockers , who verbally acknowledged these results. IMPRESSION: Segmental small left lower lobe pulmonary embolism. Increasing edema and soft tissue thickening throughout the mediastinum. Increasing anasarca There is also increasing parenchymal opacity in the right lower lobe. This has a differential  including infiltrate, however recommend short follow up to exclude a more aggressive process with the patient's history. Slight increase in tiny right effusion as well. Stable areas of nodularity left upper lobe. Recommend continued surveillance. Enlarged heart with stable pericardial effusion. Dilated esophagus with some luminal high density material. Aortic Atherosclerosis (ICD10-I70.0) and Emphysema (ICD10-J43.9). Electronically Signed   By: Karen Kays M.D.   On: 11/24/2023 15:19   DG Chest  Port 1 View  Result Date: 11/24/2023 CLINICAL DATA:  Shortness of breath, body aches and chills. History of prior right thoracotomy and talc pleurodesis for pleural effusion in 2015. EXAM: PORTABLE CHEST 1 VIEW COMPARISON:  08/24/2023 FINDINGS: Stable cardiac enlargement. Stable chronic pleural thickening and parenchymal density at the right lung base likely related to prior pleurodesis and scarring. Component of acute infection involving the right lower lung would be very difficult to exclude. No pulmonary edema or pneumothorax identified. The visualized skeletal structures are unremarkable. IMPRESSION: Stable chronic pleural thickening and parenchymal density at the right lung base likely related to prior pleurodesis and scarring. Component of acute infection involving the right lower lung would be very difficult to exclude. Stable cardiac enlargement. Electronically Signed   By: Irish Lack M.D.   On: 11/24/2023 11:07       Kathlen Mody M.D. [facility] 11/25/2023, 4:05 PM  Available via Epic secure chat 7am-7pm After 7 pm, please refer to night coverage provider listed on amion.

## 2023-11-25 NOTE — Plan of Care (Signed)
POC and goals reviewed with patient, time given for questions, education provided for heparin infusion, patient has good understanding of medication, patient handbook/guide at bedside.  Problem: Education: Goal: Knowledge of General Education information will improve Description: Including pain rating scale, medication(s)/side effects and non-pharmacologic comfort measures Outcome: Progressing   Problem: Health Behavior/Discharge Planning: Goal: Ability to manage health-related needs will improve Outcome: Progressing   Problem: Clinical Measurements: Goal: Ability to maintain clinical measurements within normal limits will improve Outcome: Progressing Goal: Will remain free from infection Outcome: Progressing Goal: Diagnostic test results will improve Outcome: Progressing Goal: Respiratory complications will improve Outcome: Progressing Goal: Cardiovascular complication will be avoided Outcome: Progressing   Problem: Activity: Goal: Risk for activity intolerance will decrease Outcome: Progressing   Problem: Nutrition: Goal: Adequate nutrition will be maintained Outcome: Progressing   Problem: Coping: Goal: Level of anxiety will decrease Outcome: Progressing   Problem: Elimination: Goal: Will not experience complications related to bowel motility Outcome: Progressing Goal: Will not experience complications related to urinary retention Outcome: Progressing   Problem: Pain Management: Goal: General experience of comfort will improve Outcome: Progressing   Problem: Safety: Goal: Ability to remain free from injury will improve Outcome: Progressing   Problem: Skin Integrity: Goal: Risk for impaired skin integrity will decrease Outcome: Progressing   Problem: Education: Goal: Knowledge of disease or condition will improve Outcome: Progressing Goal: Knowledge of the prescribed therapeutic regimen will improve Outcome: Progressing Goal: Individualized Educational  Video(s) Outcome: Progressing   Problem: Activity: Goal: Ability to tolerate increased activity will improve Outcome: Progressing Goal: Will verbalize the importance of balancing activity with adequate rest periods Outcome: Progressing   Problem: Respiratory: Goal: Ability to maintain a clear airway will improve Outcome: Progressing Goal: Levels of oxygenation will improve Outcome: Progressing Goal: Ability to maintain adequate ventilation will improve Outcome: Progressing   Problem: Activity: Goal: Ability to tolerate increased activity will improve Outcome: Progressing   Problem: Clinical Measurements: Goal: Ability to maintain a body temperature in the normal range will improve Outcome: Progressing   Problem: Respiratory: Goal: Ability to maintain adequate ventilation will improve Outcome: Progressing Goal: Ability to maintain a clear airway will improve Outcome: Progressing

## 2023-11-25 NOTE — Progress Notes (Signed)
  Echocardiogram 2D Echocardiogram has been attempted twice. First time around 8 patient was eating, second time at 12:58 pt was in chair eating. Will coordinate with nurse getting pt back in bed  Leda Roys RDCS 11/25/2023, 12:57 PM

## 2023-11-25 NOTE — Progress Notes (Signed)
PHARMACY - ANTICOAGULATION CONSULT NOTE  Pharmacy Consult for IV heparin Indication: pulmonary embolus  Allergies  Allergen Reactions   Methotrexate Shortness Of Breath   Clindamycin Hcl Diarrhea and Other (See Comments)   Lactose Intolerance (Gi)     Upset stomach   Amoxicillin Diarrhea    Hx of C Diff    Protonix [Pantoprazole Sodium] Rash    Patient Measurements: Height: 5\' 4"  (162.6 cm) Weight: 47.1 kg (103 lb 13.4 oz) IBW/kg (Calculated) : 54.7 Heparin Dosing Weight: total body weight   Vital Signs: Temp: 97.4 F (36.3 C) (12/02 0300) Temp Source: Oral (12/02 0300) BP: 123/64 (12/02 0600) Pulse Rate: 92 (12/02 0600)  Labs: Recent Labs    11/24/23 1040 11/24/23 1405 11/24/23 1540 11/24/23 1645 11/25/23 0134  HGB 12.3  --   --   --  11.4*  HCT 37.8  --   --   --  34.7*  PLT 237  --   --   --  204  APTT  --   --   --  22*  --   LABPROT  --   --  15.0  --   --   INR  --   --  1.2  --   --   HEPARINUNFRC  --   --   --   --  0.41  CREATININE 0.80  --   --   --  0.80  TROPONINIHS 41* 42*  --   --   --     Estimated Creatinine Clearance: 37.5 mL/min (by C-G formula based on SCr of 0.8 mg/dL).   Medical History: Past Medical History:  Diagnosis Date   Anemia    Arthritis    HANDS,  WRISTS   CAP (community acquired pneumonia) 12/25/2022   COPD GOLD 0/ lama/laba responsive 07/11/2019   Quit smoking 1979  07/05/2016  Walked RA x 3 laps @ 185 ft each stopped due to  End of study, nl pace no desat or sob   - Spirometry 07/05/2016  wnl p am spiriva   - CTa chest 07/05/2016 neg pe/ neg ILD, R infrahilar density c/w lung ca vs post RT scar    Chest CT w contrast  09/12/18  Stable CT of the chest  - 11/25/2018   Walked RA  2 laps @ 236ft each @ moderate pace  stopped due to  desats to 86% c   Depression 04/04/2017   Dyspnea    Dyspnea on exertion    Encounter for therapeutic drug monitoring 10/01/2016   GERD (gastroesophageal reflux disease)    at times   Heart failure  with preserved left ventricular function (HFpEF) (HCC) 11/24/2023   Hemorrhoid    History of anal fissures    History of kidney stones    History of lung cancer ONCOLOGIST--  DR Arbutus Ped--  LAST CT ,  NO RECURRENCE OR METS   DX JAN 2010 --  STAGE IIIA  NON-SMALL CELL ADENOCARCINOMA (RIGHT MIDDLE LOBE)---  S/P  CHEMORADIATIO THERAPY  (COMPLETE 03-21-2009)   HTN (hypertension) 10/10/2017   Imbalance 06/04/2017   lung ca dx'd 2010   Normal cardiac stress test    2007  PER PT   Osteoporosis    PONV (postoperative nausea and vomiting)    Rash, skin    RIGHT FOREARM/ HAND   Right wrist pain    MASS   Wears glasses     Medications:  No anticoagulants PTA  Assessment: 62 y/oF with PMH of NSCLC who  presents with dyspnea, body aches, nausea, chills x 3 days. CTa chest + for segmental small left lower lobe pulmonary embolism. Pharmacy consulted for IV heparin dosing.  Today, 11/25/2023 HL 0.44, therapeutic on 750 units/hr CBC:  Hgb 11.4, plts 204 No bleeding or complications reported by RN.    Goal of Therapy:  Heparin level 0.3-0.7 units/ml Monitor platelets by anticoagulation protocol: Yes   Plan:  Continue heparin infusion at 750 units/hr Daily CBC, heparin level Monitor closely for s/sx of bleeding Follow up long-term anticoagulation plans.     Lynann Beaver PharmD, BCPS [facility] main pharmacy [phone] 11/25/2023 7:14 AM

## 2023-11-25 NOTE — Progress Notes (Signed)
Echocardiogram 2D Echocardiogram has been performed.  Leda Roys RDCS 11/25/2023, 1:57 PM

## 2023-11-25 NOTE — Evaluation (Signed)
Physical Therapy Evaluation Patient Details Name: Dana [name] MRN: [id number] DOB: 03-15-1935 Today's Date: 11/25/2023  History of Present Illness  88 yo female admitted 12/1 with progressively worsening dyspnea associated with productive cough, wheezing, fatigue, chills, body aches, nausea and decreased appetite. POsitive for bilateral PE.PMH:anemia, osteoarthritis, rheumatoid arthritis, dyspnea on exertion, depression, GERD, hemorrhoids, anal fissures, nephrolithiasis, osteoporosis, skin rash and skin caner,, hypertension, COPD, history of pneumonia, history of lung cancer, history of radiation pneumonitis  Clinical Impression  Pt admitted with above diagnosis.  Pt currently with functional limitations due to the deficits listed below (see PT Problem List). Pt will benefit from acute skilled PT to increase their independence and safety with mobility to allow discharge.    The patient is on heparin, RN in room,  cleared patient to ambulate to BR 15' x 2 using RW(on hep < 24 hour therapy clinical protocol) Patient ambulated on RA, SPO2 96%, HR max 117. Patient with strong coughing after speaking. Encouraged patient  to  limit  speaking when possible. SPO2 remained > 96% on RA.  Patient eager to return home      If plan is discharge home, recommend the following: A little help with walking and/or transfers;A little help with bathing/dressing/bathroom;Assist for transportation   Can travel by private vehicle        Equipment Recommendations None recommended by PT  Recommendations for Other Services  OT consult    Functional Status Assessment       Precautions / Restrictions Precautions Precautions: Fall Precaution Comments: monitor sats and HR Required Braces or Orthoses: Other Brace Other Brace: to be fitted  for right knee brace 12/5 per pt at [facility], has an elastice sleeve on right knee Restrictions Weight Bearing Restrictions: No      Mobility  Bed Mobility Overal bed  mobility: Needs Assistance Bed Mobility: Supine to Sit     Supine to sit: Supervision     General bed mobility comments: assist with lines    Transfers Overall transfer level: Needs assistance Equipment used: Rolling walker (2 wheels) Transfers: Sit to/from Stand Sit to Stand: Min assist           General transfer comment: steady assist to rise , min assist to power up from toilet, use of rail    Ambulation/Gait Ambulation/Gait assistance: Min assist Gait Distance (Feet): 15 Feet (x 2) Assistive device: Rolling walker (2 wheels) Gait Pattern/deviations: Step-through pattern Gait velocity: decr     General Gait Details: assist to stabilize and manage throught door  Stairs            Wheelchair Mobility     Tilt Bed    Modified Rankin (Stroke Patients Only)       Balance Overall balance assessment: Needs assistance Sitting-balance support: Bilateral upper extremity supported, Feet supported Sitting balance-Leahy Scale: Good     Standing balance support: Single extremity supported, During functional activity Standing balance-Leahy Scale: Fair Standing balance comment: standing for self pericare                             Pertinent Vitals/Pain Pain Assessment Pain Assessment: Faces Faces Pain Scale: Hurts even more Pain Location: right knee, when ambulating. Pain Descriptors / Indicators: Discomfort Pain Intervention(s): Monitored during session, Limited activity within patient's tolerance    Home Living Family/patient expects to be discharged to:: Private residence Living Arrangements: Spouse/significant other Available Help at Discharge: Family;Available PRN/intermittently Type of Home: House Home  Access: Level entry       Home Layout: One level Home Equipment: Rollator (4 wheels);Cane - single point Additional Comments: daughter in law very supportive, spouse helpful but not physically per patient    Prior Function Prior  Level of Function : Independent/Modified Independent             Mobility Comments: uses rollator for ambulation outside, or to carry items in house. ADLs Comments: Indep     Extremity/Trunk Assessment   Upper Extremity Assessment Upper Extremity Assessment: Overall WFL for tasks assessed    Lower Extremity Assessment Lower Extremity Assessment: RLE deficits/detail RLE Deficits / Details: reports bone on bone, decreased WB  at sit to stand    Cervical / Trunk Assessment Cervical / Trunk Assessment: Kyphotic  Communication   Communication Communication: No apparent difficulties;Other (comment) (frequent coughing when talking)  Cognition Arousal: Alert Behavior During Therapy: WFL for tasks assessed/performed Overall Cognitive Status: Impaired/Different from baseline Area of Impairment: Orientation                 Orientation Level: Time             General Comments: able to give timeline but not day of week        General Comments      Exercises     Assessment/Plan    PT Assessment Patient needs continued PT services  PT Problem List Decreased mobility;Decreased strength;Decreased activity tolerance;Pain;Decreased balance;Decreased knowledge of use of DME;Decreased knowledge of precautions       PT Treatment Interventions DME instruction;Therapeutic activities;Gait training;Therapeutic exercise;Patient/family education;Functional mobility training    PT Goals (Current goals can be found in the Care Plan section)  Acute Rehab PT Goals Patient Stated Goal: to go home today PT Goal Formulation: With patient Time For Goal Achievement: 12/09/23 Potential to Achieve Goals: Good    Frequency Min 1X/week     Co-evaluation               AM-PAC PT "6 Clicks" Mobility  Outcome Measure Help needed turning from your back to your side while in a flat bed without using bedrails?: A Little Help needed moving from lying on your back to sitting on  the side of a flat bed without using bedrails?: A Little Help needed moving to and from a bed to a chair (including a wheelchair)?: A Little Help needed standing up from a chair using your arms (e.g., wheelchair or bedside chair)?: A Little Help needed to walk in hospital room?: A Lot Help needed climbing 3-5 steps with a railing? : Total 6 Click Score: 15    End of Session   Activity Tolerance: Patient tolerated treatment well;Patient limited by fatigue Patient left: in chair;with call bell/phone within reach;with chair alarm set Nurse Communication: Mobility status PT Visit Diagnosis: Unsteadiness on feet (R26.81);Difficulty in walking, not elsewhere classified (R26.2)    Time: 0865-7846 PT Time Calculation (min) (ACUTE ONLY): 30 min   Charges:   PT Evaluation $PT Eval Low Complexity: 1 Low PT Treatments $Gait Training: 8-22 mins PT General Charges $$ ACUTE PT VISIT: 1 Visit         Blanchard Kelch PT Acute Rehabilitation Services Office [phone] Weekend pager-[phone]   Rada Hay 11/25/2023, 10:53 AM

## 2023-11-26 ENCOUNTER — Inpatient Hospital Stay (HOSPITAL_COMMUNITY): Payer: Medicare Other

## 2023-11-26 DIAGNOSIS — E876 Hypokalemia: Secondary | ICD-10-CM | POA: Diagnosis not present

## 2023-11-26 DIAGNOSIS — J9601 Acute respiratory failure with hypoxia: Secondary | ICD-10-CM | POA: Diagnosis not present

## 2023-11-26 DIAGNOSIS — R7989 Other specified abnormal findings of blood chemistry: Secondary | ICD-10-CM | POA: Diagnosis not present

## 2023-11-26 DIAGNOSIS — J069 Acute upper respiratory infection, unspecified: Secondary | ICD-10-CM | POA: Diagnosis not present

## 2023-11-26 DIAGNOSIS — Z86711 Personal history of pulmonary embolism: Secondary | ICD-10-CM | POA: Diagnosis not present

## 2023-11-26 LAB — CBC
HCT: 34.6 % — ABNORMAL LOW (ref 36.0–46.0)
Hemoglobin: 10.9 g/dL — ABNORMAL LOW (ref 12.0–15.0)
MCH: 31 pg (ref 26.0–34.0)
MCHC: 31.5 g/dL (ref 30.0–36.0)
MCV: 98.3 fL (ref 80.0–100.0)
Platelets: 263 10*3/uL (ref 150–400)
RBC: 3.52 MIL/uL — ABNORMAL LOW (ref 3.87–5.11)
RDW: 15.8 % — ABNORMAL HIGH (ref 11.5–15.5)
WBC: 10.2 10*3/uL (ref 4.0–10.5)
nRBC: 0 % (ref 0.0–0.2)

## 2023-11-26 MED ORDER — AZITHROMYCIN 250 MG PO TABS
500.0000 mg | ORAL_TABLET | Freq: Every day | ORAL | Status: DC
  Administered 2023-11-26: 500 mg via ORAL
  Filled 2023-11-26: qty 2

## 2023-11-26 MED ORDER — GUAIFENESIN-DM 100-10 MG/5ML PO SYRP: 10.0000 mL | ORAL_SOLUTION | ORAL | Status: DC | PRN

## 2023-11-26 MED ORDER — ERLOTINIB HCL 150 MG PO TABS: 150.0000 mg | ORAL_TABLET | Freq: Every day | ORAL | Status: DC

## 2023-11-26 NOTE — Plan of Care (Signed)

## 2023-11-26 NOTE — Progress Notes (Signed)
OT Cancellation Note  Patient Details Name: Dana [name] MRN: [id number] DOB: September 06, 1935   Cancelled Treatment:    Reason Eval/Treat Not Completed: Patient at procedure or test/ unavailable/pt getting an ultrasound in the room.   Reuben Likes, OTR/L 11/26/2023, 3:48 PM

## 2023-11-26 NOTE — Progress Notes (Signed)
Bilateral lower extremity venous duplex has been completed. Preliminary results can be found in CV Proc through chart review.   11/26/23 12:18 PM Olen Cordial RVT

## 2023-11-26 NOTE — TOC Initial Note (Signed)
Transition of Care [facility]) - Initial/Assessment Note    Patient Details  Name: Dana [name] MRN: [id number] Date of Birth: Feb 09, 1935  [facility] CM/SW Contact:    Otelia Santee, LCSW Phone Number: 11/26/2023, 11:17 AM  Clinical Narrative:                 Pt from home with spouse. Pt agreeable to having HH services arranged. She reports having received HH services recently but, is unsure of the agency providing these services. From chart review it was discovered pt had been receiving services with [facility]. HHPT/OT has been re-arranged with [facility]. HH orders will need to be placed prior to discharge. Pt has rollator at home and denies further DME needs.   Expected Discharge Plan: Home w Home Health Services Barriers to Discharge: No Barriers Identified   Patient Goals and CMS Choice Patient states their goals for this hospitalization and ongoing recovery are:: To return home CMS Medicare.gov Compare Post Acute Care list provided to:: Patient Choice offered to / list presented to : Patient [place] ownership interest in [facility].provided to:: Patient    Expected Discharge Plan and Services In-house Referral: Clinical Social Work Discharge Planning Services: NA Post Acute Care Choice: Home Health Living arrangements for the past 2 months: Single Family Home                 DME Arranged: N/A DME Agency: NA       HH Arranged: PT, OT HH Agency: [facility] Date HH Agency Contacted: 11/26/23 Time HH Agency Contacted: 1117 Representative spoke with at [facility] Agency: Tresa Endo  Prior Living Arrangements/Services Living arrangements for the past 2 months: Single Family Home Lives with:: Spouse Patient language and need for interpreter reviewed:: Yes Do you feel safe going back to the place where you live?: Yes      Need for Family Participation in Patient Care: No (Comment) Care giver support system in place?: No (comment) Current home  services: DME (Rollator) Criminal Activity/Legal Involvement Pertinent to Current Situation/Hospitalization: No - Comment as needed  Activities of Daily Living   ADL Screening (condition at time of admission) Independently performs ADLs?: Yes (appropriate for developmental age) Is the patient deaf or have difficulty hearing?: No Does the patient have difficulty seeing, even when wearing glasses/contacts?: No Does the patient have difficulty concentrating, remembering, or making decisions?: No  Permission Sought/Granted Permission sought to share information with : Facility Industrial/product designer granted to share information with : Yes, Verbal Permission Granted     Permission granted to share info w AGENCY: [facility]        Emotional Assessment Appearance:: Appears stated age Attitude/Demeanor/Rapport: Engaged Affect (typically observed): Accepting Orientation: : Oriented to Self, Oriented to Place, Oriented to  Time, Oriented to Situation Alcohol / Substance Use: Not Applicable Psych Involvement: No (comment)  Admission diagnosis:  Hypoxia [R09.02] Acute respiratory failure with hypoxia (HCC) [J96.01] Other acute pulmonary embolism without acute cor pulmonale (HCC) [I26.99] Community acquired pneumonia, unspecified laterality [J18.9] Patient Active Problem List   Diagnosis Date Noted   Acute respiratory failure with hypoxia (HCC) 11/24/2023   Rheumatoid arthritis (HCC) 11/24/2023   Mycobacterial infection, unspecified 11/24/2023   Rheumatoid nodule (HCC) 11/24/2023   Sicca (HCC) 11/24/2023   Sjogren syndrome, unspecified (HCC) 11/24/2023   Mild protein malnutrition (HCC) 11/24/2023   Hypocalcemia 11/24/2023   Elevated troponin 11/24/2023   Heart failure with preserved left ventricular function (HFpEF) (HCC) 11/24/2023   Pulmonary  embolism (HCC) 11/24/2023   Recurrent epistaxis 08/20/2023   Hypokalemia 12/26/2022   Hypomagnesemia 12/26/2022   Acute upper  respiratory infection 12/26/2022   Protein-calorie malnutrition, severe 12/26/2022   CAP (community acquired pneumonia) 12/25/2022   Vocal cord atrophy 01/30/2021   Dysphonia 01/30/2021   AKI (acute kidney injury) (HCC) 09/22/2020   Diarrhea 09/22/2020   Hypotension 09/22/2020   C. difficile diarrhea 09/22/2020   Choledocholithiasis 09/21/2020   COPD GOLD 0/ lama/laba responsive 07/11/2019   GERD clinical dx only  07/11/2019   HTN (hypertension) 10/10/2017   Imbalance 06/04/2017   Depression 04/04/2017   Left ankle injury, initial encounter 02/28/2017   Encounter for therapeutic drug monitoring 10/01/2016   DOE (dyspnea on exertion) 07/05/2016   Pleural effusion, right 07/13/2014   Right arm cellulitis 02/03/2014   Cancer of right lung parenchyma (HCC) 11/12/2011   PCP:  Gweneth Dimitri, MD Pharmacy:   [facility] [address] [place], [place] - [address] [place] [address] Phone: [phone] Fax: [phone]  [facility] [address] [place] [address] Phone: [phone] Fax: [phone]     Social Determinants of Health (SDOH) Social History: SDOH Screenings   Food Insecurity: No Food Insecurity (11/24/2023)  Housing: Low Risk  (11/24/2023)  Transportation Needs: No Transportation Needs (11/24/2023)  Utilities: Not At Risk (11/24/2023)  Tobacco Use: Medium Risk (11/24/2023)   SDOH Interventions:     Readmission Risk Interventions    11/26/2023   11:08 AM  Readmission Risk Prevention Plan  Transportation Screening Complete  PCP or Specialist Appt within 3-5 Days Complete  HRI or Home Care Consult Complete  Social Work Consult for Recovery Care Planning/Counseling Complete  Palliative Care Screening Not Applicable  Medication Review Oceanographer) Referral to Pharmacy

## 2023-11-26 NOTE — Progress Notes (Signed)
[facility]                                                                               Dana [name], is a 88 y.o. female, DOB - 1935-05-26, XBM:[id number] Admit date - 11/24/2023    Outpatient Primary MD for the patient is Gweneth Dimitri, MD  LOS - 2  days    Brief summary    Dana [name] is a 88 y.o. female with medical history significant of anemia, osteoarthritis, rheumatoid arthritis, dyspnea on exertion, depression, GERD, hemorrhoids, anal fissures, nephrolithiasis, osteoporosis, skin rash, hypertension, COPD, history of pneumonia, history of lung cancer, history of radiation pneumonitis who is coming to the emergency department due to progressively worse dyspnea associated with productive cough, wheezing, fatigue, chills, body aches, nausea and decreased appetite since 3 days ago .  CTA chest showing segmental small left lower lobe pulmonary embolism. Increasing edema and soft tissue thickening throughout the mediastinum. Increasing anasarca. Also increase in parenchymal opacity in the right lower lobe. This could be an infiltrate, however short-term follow-up recommended to exclude a more aggressive process with the patient's history.   Assessment & Plan    Assessment and Plan:   Community-acquired pneumonia / right lower lobe pneumonia in the setting of COPD Continue with IV ceftriaxone and azithromycin, possible transition to augmentin on discharge.  negative urine streptococcal pneumonia antigen. Follow up blood cultures.    Newly diagnosed pulmonary embolism in the setting of right lung cancer Patient started on IV heparin transitioned to eliquis on discharge.  Echocardiogram reviewed and dicussed with the patient and family  Venous duplex of the lower extremities are negative for DVT.     Elevated troponins probably secondary to demand ischemia from respiratory illness.   Hypokalemia and hypomagnesemia Replaced. Repeat levels wnl.     Hypertension Blood pressure parameters are  well controlled.     GERD Continue with PPI      RN Pressure Injury Documentation: Pressure Injury 12/26/22 Hand Posterior;Right skin issue (Active)  12/26/22 0100  Location: Hand  Location Orientation: Posterior;Right  Staging:   Wound Description (Comments): skin issue  Present on Admission: Yes    Estimated body mass index is 17.82 kg/m as calculated from the following:   Height as of this encounter: 5\' 4"  (1.626 m).   Weight as of this encounter: 47.1 kg.  Code Status: DNR DVT Prophylaxis:  Heparin apixaban (ELIQUIS) tablet 10 mg  apixaban (ELIQUIS) tablet 5 mg   Level of Care: Level of care: Telemetry Family Communication: none at bedside.   Disposition Plan:     Remains inpatient appropriate:  possible dc home in am with HH.   Procedures:  None.   Consultants:   None.   Antimicrobials:   Anti-infectives (From admission, onward)    Start     Dose/Rate Route Frequency Ordered Stop   11/26/23 1800  azithromycin (ZITHROMAX) tablet 500 mg        500 mg Oral Daily-1800 11/26/23 1153 11/29/23 1759   11/25/23 1600  azithromycin (ZITHROMAX) 500 mg in sodium chloride 0.9 % 250 mL IVPB  Status:  Discontinued        500 mg 250  mL/hr over 60 Minutes Intravenous Every 24 hours 11/24/23 1703 11/26/23 1153   11/25/23 1400  cefTRIAXone (ROCEPHIN) 1 g in sodium chloride 0.9 % 100 mL IVPB        1 g 200 mL/hr over 30 Minutes Intravenous Every 24 hours 11/24/23 1703 11/30/23 1359   11/24/23 1515  azithromycin (ZITHROMAX) 500 mg in sodium chloride 0.9 % 250 mL IVPB        500 mg 250 mL/hr over 60 Minutes Intravenous  Once 11/24/23 1514 11/24/23 1824   11/24/23 1330  cefTRIAXone (ROCEPHIN) 1 g in sodium chloride 0.9 % 100 mL IVPB        1 g 200 mL/hr over 30 Minutes Intravenous  Once 11/24/23 1326 11/24/23 1439        Medications  Scheduled Meds:  albuterol  2.5 mg Nebulization BID   apixaban  10 mg Oral BID    Followed by   Melene Muller ON 12/02/2023] apixaban  5 mg Oral BID   arformoterol  15 mcg Nebulization BID   And   umeclidinium bromide  1 puff Inhalation Daily   azithromycin  500 mg Oral q1800   calcium carbonate  1 tablet Oral BID   Chlorhexidine Gluconate Cloth  6 each Topical Daily   cholecalciferol  2,000 Units Oral Daily   cyanocobalamin  1,000 mcg Oral Daily   mirtazapine  7.5 mg Oral QHS   Continuous Infusions:  cefTRIAXone (ROCEPHIN)  IV 1 g (11/26/23 1331)   PRN Meds:.acetaminophen **OR** acetaminophen, albuterol, lactase, ondansetron **OR** ondansetron (ZOFRAN) IV, mouth rinse    Subjective:   Dana [name] was seen and examined today.  Some intermittent cough. No chest pain . No nausea or vomiting.   Objective:   Vitals:   11/25/23 2019 11/26/23 0020 11/26/23 0520 11/26/23 1358  BP:  (!) 119/90 115/62 (!) 157/90  Pulse:  100 96 (!) 107  Resp:  18 18 17   Temp:  97.8 F (36.6 C) 97.9 F (36.6 C) 98 F (36.7 C)  TempSrc:  Oral Oral Oral  SpO2: 99% 95% 97% 96%  Weight:      Height:        Intake/Output Summary (Last 24 hours) at 11/26/2023 1623 Last data filed at 11/25/2023 2122 Gross per 24 hour  Intake 120 ml  Output --  Net 120 ml   Filed Weights   11/24/23 1535 11/24/23 2230  Weight: 47.5 kg 47.1 kg     Exam General exam: Appears calm and comfortable  Respiratory system: Clear to auscultation. Respiratory effort normal. Cardiovascular system: S1 & S2 heard, RRR. Gastrointestinal system: Abdomen is nondistended, soft and nontender.  Central nervous system: Alert and oriented. No focal neurological deficits. Extremities: Symmetric 5 x 5 power. Skin: No rashes, Psychiatry: Mood & affect appropriate.     Data Reviewed:  I have personally reviewed following labs and imaging studies   CBC Lab Results  Component Value Date   WBC 10.2 11/26/2023   RBC 3.52 (L) 11/26/2023   HGB 10.9 (L) 11/26/2023   HCT 34.6 (L) 11/26/2023   MCV 98.3 11/26/2023    MCH 31.0 11/26/2023   PLT 263 11/26/2023   MCHC 31.5 11/26/2023   RDW 15.8 (H) 11/26/2023   LYMPHSABS 0.2 (L) 11/24/2023   MONOABS 0.6 11/24/2023   EOSABS 0.0 11/24/2023   BASOSABS 0.0 11/24/2023     Last metabolic panel Lab Results  Component Value Date   NA 139 11/25/2023   K 4.0 11/25/2023   CL 109  11/25/2023   CO2 18 (L) 11/25/2023   BUN 26 (H) 11/25/2023   CREATININE 0.80 11/25/2023   GLUCOSE 157 (H) 11/25/2023   GFRNONAA >60 11/25/2023   GFRAA >60 09/26/2020   CALCIUM 8.0 (L) 11/25/2023   PHOS 2.5 11/25/2023   PROT 5.5 (L) 11/25/2023   ALBUMIN 3.0 (L) 11/25/2023   BILITOT 0.6 11/25/2023   ALKPHOS 41 11/25/2023   AST 24 11/25/2023   ALT 23 11/25/2023   ANIONGAP 12 11/25/2023    CBG (last 3)  No results for input(s): "GLUCAP" in the last 72 hours.    Coagulation Profile: Recent Labs  Lab 11/24/23 1540  INR 1.2     Radiology Studies: VAS [place] LOWER EXTREMITY VENOUS (DVT)  Result Date: 11/26/2023  Lower Venous DVT Study Patient Name:  Dana [name]  Date of Exam:   11/26/2023 Medical Rec #: [id number]       Accession #:    [id number] Date of Birth: Nov 23, 1935       Patient Gender: F Patient Age:   58 years Exam Location:  [facility] Procedure:      VAS [place] LOWER EXTREMITY VENOUS (DVT) Referring Phys: Kathlen Mody --------------------------------------------------------------------------------  Indications: Pulmonary embolism.  Risk Factors: Confirmed PE. Anticoagulation: Heparin. Limitations: Poor ultrasound/tissue interface. Comparison Study: No prior studies. Performing Technologist: Chanda Busing RVT  Examination Guidelines: A complete evaluation includes B-mode imaging, spectral Doppler, color Doppler, and power Doppler as needed of all accessible portions of each vessel. Bilateral testing is considered an integral part of a complete examination. Limited examinations for reoccurring indications may be performed as noted. The reflux portion of the exam is  performed with the patient in reverse Trendelenburg.  +---------+---------------+---------+-----------+----------+--------------+ RIGHT    CompressibilityPhasicitySpontaneityPropertiesThrombus Aging +---------+---------------+---------+-----------+----------+--------------+ CFV      Full           Yes      Yes                                 +---------+---------------+---------+-----------+----------+--------------+ SFJ      Full                                                        +---------+---------------+---------+-----------+----------+--------------+ FV Prox  Full                                                        +---------+---------------+---------+-----------+----------+--------------+ FV Mid   Full                                                        +---------+---------------+---------+-----------+----------+--------------+ FV DistalFull                                                        +---------+---------------+---------+-----------+----------+--------------+ PFV  Full                                                        +---------+---------------+---------+-----------+----------+--------------+ POP      Full           Yes      Yes                                 +---------+---------------+---------+-----------+----------+--------------+ PTV      Full                                                        +---------+---------------+---------+-----------+----------+--------------+ PERO     Full                                                        +---------+---------------+---------+-----------+----------+--------------+   +---------+---------------+---------+-----------+----------+--------------+ LEFT     CompressibilityPhasicitySpontaneityPropertiesThrombus Aging +---------+---------------+---------+-----------+----------+--------------+ CFV      Full           Yes      Yes                                  +---------+---------------+---------+-----------+----------+--------------+ SFJ      Full                                                        +---------+---------------+---------+-----------+----------+--------------+ FV Prox  Full                                                        +---------+---------------+---------+-----------+----------+--------------+ FV Mid   Full                                                        +---------+---------------+---------+-----------+----------+--------------+ FV DistalFull                                                        +---------+---------------+---------+-----------+----------+--------------+ PFV      Full                                                        +---------+---------------+---------+-----------+----------+--------------+  POP      Full           Yes      Yes                                 +---------+---------------+---------+-----------+----------+--------------+ PTV      Full                                                        +---------+---------------+---------+-----------+----------+--------------+ PERO     Full                                                        +---------+---------------+---------+-----------+----------+--------------+    Summary: RIGHT: - There is no evidence of deep vein thrombosis in the lower extremity.  - No cystic structure found in the popliteal fossa.  LEFT: - There is no evidence of deep vein thrombosis in the lower extremity.  - No cystic structure found in the popliteal fossa.  *See table(s) above for measurements and observations.    Preliminary    ECHOCARDIOGRAM COMPLETE  Result Date: 11/25/2023    ECHOCARDIOGRAM REPORT   Patient Name:   Dana [name] Date of Exam: 11/25/2023 Medical Rec #:  [id number]      Height:       64.0 in Accession #:    [id number]     Weight:       103.8 lb Date of Birth:  May 14, 1935      BSA:          1.481 m Patient  Age:    88 years       BP:           102/56 mmHg Patient Gender: F              HR:           107 bpm. Exam Location:  Inpatient Procedure: 2D Echo, Cardiac Doppler and Color Doppler Indications:    Pulmonary Embolus I26.09  History:        Patient has prior history of Echocardiogram examinations, most                 recent 07/05/2020. COPD; Risk Factors:Hypertension. Lung CA.  Sonographer:    Harriette Bouillon RDCS Referring Phys: [phone] DAVID MANUEL ORTIZ IMPRESSIONS  1. Left ventricular ejection fraction, by estimation, is >75%. The left ventricle has hyperdynamic function. The left ventricle has no regional wall motion abnormalities. Left ventricular diastolic parameters are consistent with Grade I diastolic dysfunction (impaired relaxation).  2. Right ventricular systolic function is normal. The right ventricular size is mildly enlarged. There is normal pulmonary artery systolic pressure. The estimated right ventricular systolic pressure is 23.5 mmHg.  3. The mitral valve is abnormal. Trivial mitral valve regurgitation.  4. The aortic valve was not well visualized. Aortic valve regurgitation is trivial. Aortic valve sclerosis/calcification is present, without any evidence of aortic stenosis.  5. The inferior vena cava is normal in size with <50% respiratory variability, suggesting right atrial pressure of 8 mmHg. Comparison(s): Changes from prior study are noted. 07/05/2020: LVEF 60-65%.  FINDINGS  Left Ventricle: Left ventricular ejection fraction, by estimation, is >75%. The left ventricle has hyperdynamic function. The left ventricle has no regional wall motion abnormalities. The left ventricular internal cavity size was normal in size. There is no left ventricular hypertrophy. Left ventricular diastolic parameters are consistent with Grade I diastolic dysfunction (impaired relaxation). Indeterminate filling pressures. Right Ventricle: The right ventricular size is mildly enlarged. No increase in right  ventricular wall thickness. Right ventricular systolic function is normal. There is normal pulmonary artery systolic pressure. The tricuspid regurgitant velocity is 1.97  m/s, and with an assumed right atrial pressure of 8 mmHg, the estimated right ventricular systolic pressure is 23.5 mmHg. Left Atrium: Left atrial size was normal in size. Right Atrium: Right atrial size was normal in size. Pericardium: Trivial pericardial effusion is present. The pericardial effusion is localized near the right ventricle. Mitral Valve: The mitral valve is abnormal. Mild to moderate mitral annular calcification. Trivial mitral valve regurgitation. Tricuspid Valve: The tricuspid valve is grossly normal. Tricuspid valve regurgitation is trivial. Aortic Valve: The aortic valve was not well visualized. Aortic valve regurgitation is trivial. Aortic valve sclerosis/calcification is present, without any evidence of aortic stenosis. Pulmonic Valve: The pulmonic valve was normal in structure. Pulmonic valve regurgitation is not visualized. Aorta: The aortic root and ascending aorta are structurally normal, with no evidence of dilitation. Venous: The inferior vena cava is normal in size with less than 50% respiratory variability, suggesting right atrial pressure of 8 mmHg. IAS/Shunts: The interatrial septum was not well visualized.  LEFT VENTRICLE PLAX 2D LVIDd:         3.90 cm   Diastology LVIDs:         1.90 cm   LV e' medial:    4.03 cm/s LV PW:         0.90 cm   LV E/e' medial:  16.4 LV IVS:        0.90 cm   LV e' lateral:   8.27 cm/s LVOT diam:     1.70 cm   LV E/e' lateral: 8.0 LV SV:         41 LV SV Index:   28 LVOT Area:     2.27 cm  RIGHT VENTRICLE             IVC RV S prime:     12.20 cm/s  IVC diam: 1.50 cm TAPSE (M-mode): 2.3 cm LEFT ATRIUM             Index LA diam:        2.80 cm 1.89 cm/m LA Vol (A2C):   21.6 ml 14.59 ml/m LA Vol (A4C):   36.6 ml 24.72 ml/m LA Biplane Vol: 28.5 ml 19.25 ml/m  AORTIC VALVE LVOT Vmax:    114.00 cm/s LVOT Vmean:  76.000 cm/s LVOT VTI:    0.182 m  AORTA Ao Root diam: 2.70 cm Ao Asc diam:  2.10 cm MITRAL VALVE                TRICUSPID VALVE MV Area (PHT): 3.74 cm     TR Peak grad:   15.5 mmHg MV Decel Time: 203 msec     TR Vmax:        197.00 cm/s MV E velocity: 66.00 cm/s MV A velocity: 106.00 cm/s  SHUNTS MV E/A ratio:  0.62         Systemic VTI:  0.18 m  Systemic Diam: 1.70 cm Zoila Shutter MD Electronically signed by Zoila Shutter MD Signature Date/Time: 11/25/2023/4:36:24 PM    Final        Kathlen Mody M.D. [facility] 11/26/2023, 4:23 PM  Available via Epic secure chat 7am-7pm After 7 pm, please refer to night coverage provider listed on amion.

## 2023-11-27 DIAGNOSIS — J9601 Acute respiratory failure with hypoxia: Secondary | ICD-10-CM | POA: Diagnosis not present

## 2023-11-27 LAB — BASIC METABOLIC PANEL
Anion gap: 8 (ref 5–15)
BUN: 35 mg/dL — ABNORMAL HIGH (ref 8–23)
CO2: 25 mmol/L (ref 22–32)
Calcium: 9.2 mg/dL (ref 8.9–10.3)
Chloride: 106 mmol/L (ref 98–111)
Creatinine, Ser: 0.93 mg/dL (ref 0.44–1.00)
GFR, Estimated: 60 mL/min — ABNORMAL LOW (ref >60)
Glucose, Bld: 91 mg/dL (ref 70–99)
Potassium: 3.7 mmol/L (ref 3.5–5.1)
Sodium: 139 mmol/L (ref 135–145)

## 2023-11-27 LAB — CBC
HCT: 32.8 % — ABNORMAL LOW (ref 36.0–46.0)
Hemoglobin: 10.9 g/dL — ABNORMAL LOW (ref 12.0–15.0)
MCH: 31.3 pg (ref 26.0–34.0)
MCHC: 33.2 g/dL (ref 30.0–36.0)
MCV: 94.3 fL (ref 80.0–100.0)
Platelets: 269 10*3/uL (ref 150–400)
RBC: 3.48 MIL/uL — ABNORMAL LOW (ref 3.87–5.11)
RDW: 15.1 % (ref 11.5–15.5)
WBC: 6.9 10*3/uL (ref 4.0–10.5)
nRBC: 0 % (ref 0.0–0.2)

## 2023-11-27 LAB — LEGIONELLA PNEUMOPHILA SEROGP 1 UR AG: L. pneumophila Serogp 1 Ur Ag: NEGATIVE

## 2023-11-27 MED ORDER — AZITHROMYCIN 250 MG PO TABS: 250.0000 mg | ORAL_TABLET | Freq: Every day | ORAL | 0 refills | Status: AC

## 2023-11-27 MED ORDER — APIXABAN 5 MG PO TABS: ORAL_TABLET | ORAL | 0 refills | Status: DC

## 2023-11-27 MED ORDER — CEFDINIR 300 MG PO CAPS: 300.0000 mg | ORAL_CAPSULE | Freq: Two times a day (BID) | ORAL | 0 refills | Status: AC

## 2023-11-27 NOTE — Evaluation (Signed)
Occupational Therapy Evaluation Patient Details Name: Dana [name] MRN: [id number] DOB: [birth year] Today's Date: 11/27/2023   History of Present Illness 88 yo female admitted 12/1 with progressively worsening dyspnea associated with productive cough, wheezing, fatigue, chills, body aches, nausea and decreased appetite. POsitive for bilateral PE.PMH:anemia, osteoarthritis, rheumatoid arthritis, dyspnea on exertion, depression, GERD, hemorrhoids, anal fissures, nephrolithiasis, osteoporosis, skin rash and skin caner,, hypertension, COPD, history of pneumonia, history of lung cancer, history of radiation pneumonitis   Clinical Impression   Pt walks with a rollator, has assist for housekeeping and errands, prepares simple meals with microwave and is mod I in self care. Educated in energy conservation strategies, benefit of AE and compensatory strategies for LB ADLs and availability of tub transfer bench to avoid stepping over edge of tub. Pt receptive to all information. Recommending HHOT upon discharge.       If plan is discharge home, recommend the following: A little help with walking and/or transfers;A little help with bathing/dressing/bathroom;Assistance with cooking/housework;Assist for transportation;Help with stairs or ramp for entrance    Functional Status Assessment  Patient has had a recent decline in their functional status and demonstrates the ability to make significant improvements in function in a reasonable and predictable amount of time.  Equipment Recommendations  None recommended by OT    Recommendations for Other Services       Precautions / Restrictions Precautions Precautions: Fall Restrictions Weight Bearing Restrictions: No      Mobility Bed Mobility               General bed mobility comments: in chair    Transfers Overall transfer level: Needs assistance Equipment used: Rolling walker (2 wheels) Transfers: Sit to/from Stand Sit to Stand:  Supervision           General transfer comment: increased time      Balance Overall balance assessment: Needs assistance   Sitting balance-Leahy Scale: Good       Standing balance-Leahy Scale: Fair                             ADL either performed or assessed with clinical judgement   ADL Overall ADL's : Needs assistance/impaired Eating/Feeding: Independent;Sitting   Grooming: Standing;Supervision/safety   Upper Body Bathing: Set up;Sitting   Lower Body Bathing: Minimal assistance;Sitting/lateral leans Lower Body Bathing Details (indicate cue type and reason): recommended long handled bath sponge and use of reacher Upper Body Dressing : Set up;Sitting   Lower Body Dressing: Minimal assistance;Sit to/from stand Lower Body Dressing Details (indicate cue type and reason): recommended reacher and sock aid, to dress R LE first Toilet Transfer: Supervision/safety;Ambulation;Rolling walker (2 wheels)           Functional mobility during ADLs: Supervision/safety;Rolling walker (2 wheels) General ADL Comments: talked about ways pt can increase her fluid intake and in energy conservation strategies     Vision Baseline Vision/History: 1 Wears glasses Ability to See in Adequate Light: 0 Adequate Patient Visual Report: No change from baseline       Perception         Praxis         Pertinent Vitals/Pain Pain Assessment Pain Assessment: No/denies pain Faces Pain Scale: Hurts little more Pain Location: R knee Pain Descriptors / Indicators: Sore Pain Intervention(s): Limited activity within patient's tolerance, Monitored during session     Extremity/Trunk Assessment Upper Extremity Assessment Upper Extremity Assessment: Generalized weakness   Lower Extremity Assessment Lower  Extremity Assessment: Defer to PT evaluation   Cervical / Trunk Assessment Cervical / Trunk Assessment: Kyphotic   Communication Communication Communication: No apparent  difficulties   Cognition Arousal: Alert Behavior During Therapy: WFL for tasks assessed/performed Overall Cognitive Status: Within Functional Limits for tasks assessed                                       General Comments       Exercises     Shoulder Instructions      Home Living Family/patient expects to be discharged to:: Private residence Living Arrangements: Spouse/significant other Available Help at Discharge: Family;Available 24 hours/day (husband has memory loss) Type of Home: House Home Access: Level entry     Home Layout: One level     Bathroom Shower/Tub: Chief Strategy Officer: Standard     Home Equipment: Rollator (4 wheels);Cane - single point;Shower seat;Hand held shower head;Adaptive equipment Adaptive Equipment: Reacher Additional Comments: family assists with getting groceries and appointments, husband can physically assist, but requires reminders      Prior Functioning/Environment Prior Level of Function : Needs assist             Mobility Comments: uses rollator for ambulation outside, or to carry items in house. ADLs Comments: mod I for self care, makes microwave meals, has a housekeeper for heavy work, kids help with errands        OT Problem List:        OT Treatment/Interventions:      OT Goals(Current goals can be found in the care plan section)    OT Frequency:      Co-evaluation              AM-PAC OT "6 Clicks" Daily Activity     Outcome Measure Help from another person eating meals?: None Help from another person taking care of personal grooming?: A Little Help from another person toileting, which includes using toliet, bedpan, or urinal?: A Little Help from another person bathing (including washing, rinsing, drying)?: A Little Help from another person to put on and taking off regular upper body clothing?: A Little Help from another person to put on and taking off regular lower body  clothing?: A Little 6 Click Score: 19   End of Session Equipment Utilized During Treatment: Gait belt;Rolling walker (2 wheels)  Activity Tolerance: Patient tolerated treatment well Patient left: in chair;with call bell/phone within reach;with nursing/sitter in room  OT Visit Diagnosis: Unsteadiness on feet (R26.81);Other abnormalities of gait and mobility (R26.89);Other (comment);Pain;Muscle weakness (generalized) (M62.81) (decreased activity tolerance)                Time: 1031-1100 OT Time Calculation (min): 29 min Charges:  OT General Charges $OT Visit: 1 Visit OT Evaluation $OT Eval Moderate Complexity: 1 Mod OT Treatments $Self Care/Home Management : 8-22 mins Berna Spare, OTR/L Acute Rehabilitation Services Office: [phone]   Evern Bio 11/27/2023, 11:08 AM

## 2023-11-27 NOTE — Discharge Summary (Signed)
Physician Discharge Summary   Patient: Dana [name] MRN: [id number] DOB: Apr 26, 1935  Admit date:     11/24/2023  Discharge date: 11/27/23  Discharge Physician: Tyrone Nine   PCP: Gweneth Dimitri, MD   Recommendations at discharge:  Follow up with PCP/oncology to arrange repeat CT chest after convalescence from/treatment of pneumonia.  Continue eliquis for PE and complete a course of antibiotics as outlined below.  Discharge Diagnoses: Principal Problem:   Acute respiratory failure with hypoxia (HCC) Active Problems:   Cancer of right lung parenchyma (HCC)   Depression   HTN (hypertension)   COPD GOLD 0/ lama/laba responsive   GERD clinical dx only    CAP (community acquired pneumonia)   Hypokalemia   Hypomagnesemia   Acute upper respiratory infection   Mild protein malnutrition (HCC)   Hypocalcemia   Elevated troponin   Heart failure with preserved left ventricular function (HFpEF) (HCC)   Pulmonary embolism [facility])   Hospital Course: Dana [name] is a 88 y.o. female with medical history significant of anemia, osteoarthritis, rheumatoid arthritis, dyspnea on exertion, depression, GERD, hemorrhoids, anal fissures, nephrolithiasis, osteoporosis, skin rash, hypertension, COPD, history of pneumonia, history of lung cancer, history of radiation pneumonitis who is coming to the emergency department due to progressively worse dyspnea associated with productive cough, wheezing, fatigue, chills, body aches, nausea and decreased appetite since 3 days ago .  CTA chest showing segmental small left lower lobe pulmonary embolism. Increasing edema and soft tissue thickening throughout the mediastinum. Increasing anasarca. Also increase in parenchymal opacity in the right lower lobe. This could be an infiltrate, however short-term follow-up recommended to exclude a more aggressive process with the patient's history.   She has been treated with antibiotics and anticoagulation with significant  improvement in respiratory symptoms. She is not hypoxemic and requests discharge home.   Assessment and Plan: Community-acquired pneumonia / right lower lobe pneumonia in the setting of COPD: Blood cultures NGTD, Pneumococcal Ag neg.  - Continued with IV ceftriaxone and azithromycin, will complete 7 days omnicef and 5 days azithromycin given possibly complicated nature.  - Repeat CT recommended, d/w pt.    Newly diagnosed pulmonary embolism in the setting of right lung cancer: Patient started on IV heparin transitioned to eliquis on discharge.  Echocardiogram shows normal RVSF, PASP, LVEF.  Venous duplex of the lower extremities are negative for DVT.  - Continue eliquis loading then maintenance dose for minimum of 3 months   Elevated troponins probably secondary to demand ischemia from respiratory illness.     Hypokalemia and hypomagnesemia Replaced. Repeat levels wnl.    Hypertension Blood pressure parameters are  well controlled.    Underweight: Body mass index is 17.82 kg/m.    GERD Continue with PPI  Consultants: None Procedures performed: Echo  Disposition: Home Diet recommendation: As tolerated DISCHARGE MEDICATION: Allergies as of 11/27/2023       Reactions   Methotrexate Shortness Of Breath   Clindamycin Hcl Diarrhea, Other (See Comments)   Lactose Intolerance (gi)    Upset stomach   Amoxicillin Diarrhea   Hx of C Diff    Protonix [pantoprazole Sodium] Rash        Medication List     TAKE these medications    acetaminophen 650 MG CR tablet Commonly known as: TYLENOL Take 650 mg by mouth daily.   AMBULATORY NON FORMULARY MEDICATION R knee brace Dispense 1 Dx code: M17.11 Use as needed   apixaban 5 MG Tabs tablet Commonly known as: ELIQUIS  Take 2 tablets (10 mg total) by mouth 2 (two) times daily for 5 days, THEN 1 tablet (5 mg total) 2 (two) times daily for 25 days. Start taking on: November 27, 2023   azithromycin 250 MG tablet Commonly known  as: ZITHROMAX Take 1 tablet (250 mg total) by mouth daily for 2 doses.   b complex vitamins capsule Take 1 capsule by mouth daily.   bismuth subsalicylate 262 MG chewable tablet Commonly known as: PEPTO BISMOL Chew 262 mg by mouth as needed for indigestion or diarrhea or loose stools.   Calcium 600+D Plus Minerals 600-400 MG-UNIT Tabs Take 1 tablet by mouth 2 (two) times daily.   cefdinir 300 MG capsule Commonly known as: OMNICEF Take 1 capsule (300 mg total) by mouth 2 (two) times daily for 4 days.   cyanocobalamin 1000 MCG tablet Commonly known as: VITAMIN B12 Take 1,000 mcg by mouth daily.   desonide 0.05 % ointment Commonly known as: DESOWEN Apply 1 Application topically 2 (two) times daily as needed (scaly skin).   erlotinib 100 MG tablet Commonly known as: Tarceva Take 1 tablet (100 mg total) by mouth daily.   hydrocortisone 2.5 % cream Apply 1 application  topically every other day.   lactase 3000 units tablet Commonly known as: LACTAID Take 3,000 Units by mouth daily as needed (when eating dairy products).   loperamide 2 MG tablet Commonly known as: IMODIUM A-D Take 2 mg by mouth as needed for diarrhea or loose stools.   loratadine 10 MG tablet Commonly known as: CLARITIN Take 10 mg by mouth daily as needed for allergies.   mirtazapine 15 MG tablet Commonly known as: Remeron Take 1 tablet (15 mg total) by mouth at bedtime. What changed: how much to take   PRESERVISION AREDS 2 PO Take 1 tablet by mouth 2 (two) times daily.   Hair Skin and Nails Formula Tabs Take 1 tablet by mouth daily.   Probiotic Acidophilus Chew Chew 1 tablet by mouth daily.   Stiolto Respimat 2.5-2.5 MCG/ACT Aers Generic drug: Tiotropium Bromide-Olodaterol INHALE 2 PUFFS BY MOUTH ONCE DAILY   SYSTANE OP Place 1 drop into both eyes at bedtime.   tacrolimus 1 MG capsule Commonly known as: PROGRAF 1 mg See admin instructions. Dissolve 1 mg capsule in 1/2 liter of water  soaking a cotton ball and apply to wound twice daily   Vitamin D 50 MCG (2000 UT) tablet Take 2,000 Units by mouth daily.        Follow-up Information     [facility] Follow up.   Specialty: Home Health Services Why: [facility] will follow up with you at discharge to provide home health services Contact information: [address] [place] [address] [phone]         Gweneth Dimitri, MD Follow up.   Specialty: Family Medicine Contact information: [address] [place] [address] [phone]         Heilingoetter, Cassandra L, PA-C Follow up.   Specialty: Physician Assistant Contact information: [address] [place] [address] [phone]                Discharge Exam: Ceasar Mons Weights   11/24/23 1535 11/24/23 2230  Weight: 47.5 kg 47.1 kg  BP 120/78 (BP Location: Left Arm)   Pulse 95   Temp 98 F (36.7 C) (Oral)   Resp 16   Ht 5\' 4"  (1.626 m)   Wt 47.1 kg   SpO2 93%  BMI 17.82 kg/m   Pleasant well-appearing elderly female seen ambulating in halls in no distress Coarse R > L, nonlabored on room air, no wheezes  Condition at discharge: stable  The results of significant diagnostics from this hospitalization (including imaging, microbiology, ancillary and laboratory) are listed below for reference.   Imaging Studies: VAS [place] LOWER EXTREMITY VENOUS (DVT)  Result Date: 11/26/2023  Lower Venous DVT Study Patient Name:  SAPNA FLORENCE  Date of Exam:   11/26/2023 Medical Rec #: [id number]       Accession #:    [id number] Date of Birth: 1935/02/10       Patient Gender: F Patient Age:   72 years Exam Location:  [facility] Procedure:      VAS [place] LOWER EXTREMITY VENOUS (DVT) Referring Phys: Kathlen Mody --------------------------------------------------------------------------------  Indications: Pulmonary embolism.  Risk Factors: Confirmed PE. Anticoagulation: Heparin. Limitations: Poor ultrasound/tissue  interface. Comparison Study: No prior studies. Performing Technologist: Chanda Busing RVT  Examination Guidelines: A complete evaluation includes B-mode imaging, spectral Doppler, color Doppler, and power Doppler as needed of all accessible portions of each vessel. Bilateral testing is considered an integral part of a complete examination. Limited examinations for reoccurring indications may be performed as noted. The reflux portion of the exam is performed with the patient in reverse Trendelenburg.  +---------+---------------+---------+-----------+----------+--------------+ RIGHT    CompressibilityPhasicitySpontaneityPropertiesThrombus Aging +---------+---------------+---------+-----------+----------+--------------+ CFV      Full           Yes      Yes                                 +---------+---------------+---------+-----------+----------+--------------+ SFJ      Full                                                        +---------+---------------+---------+-----------+----------+--------------+ FV Prox  Full                                                        +---------+---------------+---------+-----------+----------+--------------+ FV Mid   Full                                                        +---------+---------------+---------+-----------+----------+--------------+ FV DistalFull                                                        +---------+---------------+---------+-----------+----------+--------------+ PFV      Full                                                        +---------+---------------+---------+-----------+----------+--------------+ POP  Full           Yes      Yes                                 +---------+---------------+---------+-----------+----------+--------------+ PTV      Full                                                         +---------+---------------+---------+-----------+----------+--------------+ PERO     Full                                                        +---------+---------------+---------+-----------+----------+--------------+   +---------+---------------+---------+-----------+----------+--------------+ LEFT     CompressibilityPhasicitySpontaneityPropertiesThrombus Aging +---------+---------------+---------+-----------+----------+--------------+ CFV      Full           Yes      Yes                                 +---------+---------------+---------+-----------+----------+--------------+ SFJ      Full                                                        +---------+---------------+---------+-----------+----------+--------------+ FV Prox  Full                                                        +---------+---------------+---------+-----------+----------+--------------+ FV Mid   Full                                                        +---------+---------------+---------+-----------+----------+--------------+ FV DistalFull                                                        +---------+---------------+---------+-----------+----------+--------------+ PFV      Full                                                        +---------+---------------+---------+-----------+----------+--------------+ POP      Full           Yes      Yes                                 +---------+---------------+---------+-----------+----------+--------------+  PTV      Full                                                        +---------+---------------+---------+-----------+----------+--------------+ PERO     Full                                                        +---------+---------------+---------+-----------+----------+--------------+     Summary: RIGHT: - There is no evidence of deep vein thrombosis in the lower extremity.  - No cystic structure found in  the popliteal fossa.  LEFT: - There is no evidence of deep vein thrombosis in the lower extremity.  - No cystic structure found in the popliteal fossa.  *See table(s) above for measurements and observations. Electronically signed by Lemar Livings MD on 11/26/2023 at 5:15:46 PM.    Final    ECHOCARDIOGRAM COMPLETE  Result Date: 11/25/2023    ECHOCARDIOGRAM REPORT   Patient Name:   Dana [name] Date of Exam: 11/25/2023 Medical Rec #:  [id number]      Height:       64.0 in Accession #:    [id number]     Weight:       103.8 lb Date of Birth:  [birth year]      BSA:          1.481 m Patient Age:    88 years       BP:           102/56 mmHg Patient Gender: F              HR:           107 bpm. Exam Location:  Inpatient Procedure: 2D Echo, Cardiac Doppler and Color Doppler Indications:    Pulmonary Embolus I26.09  History:        Patient has prior history of Echocardiogram examinations, most                 recent 07/05/2020. COPD; Risk Factors:Hypertension. Lung CA.  Sonographer:    Harriette Bouillon RDCS Referring Phys: [phone] DAVID MANUEL ORTIZ IMPRESSIONS  1. Left ventricular ejection fraction, by estimation, is >75%. The left ventricle has hyperdynamic function. The left ventricle has no regional wall motion abnormalities. Left ventricular diastolic parameters are consistent with Grade I diastolic dysfunction (impaired relaxation).  2. Right ventricular systolic function is normal. The right ventricular size is mildly enlarged. There is normal pulmonary artery systolic pressure. The estimated right ventricular systolic pressure is 23.5 mmHg.  3. The mitral valve is abnormal. Trivial mitral valve regurgitation.  4. The aortic valve was not well visualized. Aortic valve regurgitation is trivial. Aortic valve sclerosis/calcification is present, without any evidence of aortic stenosis.  5. The inferior vena cava is normal in size with <50% respiratory variability, suggesting right atrial pressure of 8 mmHg. Comparison(s):  Changes from prior study are noted. 07/05/2020: LVEF 60-65%. FINDINGS  Left Ventricle: Left ventricular ejection fraction, by estimation, is >75%. The left ventricle has hyperdynamic function. The left ventricle has no regional wall motion abnormalities. The left ventricular internal cavity size was normal in size. There is no left ventricular hypertrophy. Left  ventricular diastolic parameters are consistent with Grade I diastolic dysfunction (impaired relaxation). Indeterminate filling pressures. Right Ventricle: The right ventricular size is mildly enlarged. No increase in right ventricular wall thickness. Right ventricular systolic function is normal. There is normal pulmonary artery systolic pressure. The tricuspid regurgitant velocity is 1.97  m/s, and with an assumed right atrial pressure of 8 mmHg, the estimated right ventricular systolic pressure is 23.5 mmHg. Left Atrium: Left atrial size was normal in size. Right Atrium: Right atrial size was normal in size. Pericardium: Trivial pericardial effusion is present. The pericardial effusion is localized near the right ventricle. Mitral Valve: The mitral valve is abnormal. Mild to moderate mitral annular calcification. Trivial mitral valve regurgitation. Tricuspid Valve: The tricuspid valve is grossly normal. Tricuspid valve regurgitation is trivial. Aortic Valve: The aortic valve was not well visualized. Aortic valve regurgitation is trivial. Aortic valve sclerosis/calcification is present, without any evidence of aortic stenosis. Pulmonic Valve: The pulmonic valve was normal in structure. Pulmonic valve regurgitation is not visualized. Aorta: The aortic root and ascending aorta are structurally normal, with no evidence of dilitation. Venous: The inferior vena cava is normal in size with less than 50% respiratory variability, suggesting right atrial pressure of 8 mmHg. IAS/Shunts: The interatrial septum was not well visualized.  LEFT VENTRICLE PLAX 2D LVIDd:          3.90 cm   Diastology LVIDs:         1.90 cm   LV e' medial:    4.03 cm/s LV PW:         0.90 cm   LV E/e' medial:  16.4 LV IVS:        0.90 cm   LV e' lateral:   8.27 cm/s LVOT diam:     1.70 cm   LV E/e' lateral: 8.0 LV SV:         41 LV SV Index:   28 LVOT Area:     2.27 cm  RIGHT VENTRICLE             IVC RV S prime:     12.20 cm/s  IVC diam: 1.50 cm TAPSE (M-mode): 2.3 cm LEFT ATRIUM             Index LA diam:        2.80 cm 1.89 cm/m LA Vol (A2C):   21.6 ml 14.59 ml/m LA Vol (A4C):   36.6 ml 24.72 ml/m LA Biplane Vol: 28.5 ml 19.25 ml/m  AORTIC VALVE LVOT Vmax:   114.00 cm/s LVOT Vmean:  76.000 cm/s LVOT VTI:    0.182 m  AORTA Ao Root diam: 2.70 cm Ao Asc diam:  2.10 cm MITRAL VALVE                TRICUSPID VALVE MV Area (PHT): 3.74 cm     TR Peak grad:   15.5 mmHg MV Decel Time: 203 msec     TR Vmax:        197.00 cm/s MV E velocity: 66.00 cm/s MV A velocity: 106.00 cm/s  SHUNTS MV E/A ratio:  0.62         Systemic VTI:  0.18 m                             Systemic Diam: 1.70 cm Zoila Shutter MD Electronically signed by Zoila Shutter MD Signature Date/Time: 11/25/2023/4:36:24 PM    Final    CT Angio Chest PE  W and/or Wo Contrast  Result Date: 11/24/2023 CLINICAL DATA:  Fever and chills. History of non-small-cell lung cancer. * Tracking Code: BO * EXAM: CT ANGIOGRAPHY CHEST WITH CONTRAST TECHNIQUE: Multidetector CT imaging of the chest was performed using the standard protocol during bolus administration of intravenous contrast. Multiplanar CT image reconstructions and MIPs were obtained to evaluate the vascular anatomy. RADIATION DOSE REDUCTION: This exam was performed according to the departmental dose-optimization program which includes automated exposure control, adjustment of the mA and/or kV according to patient size and/or use of iterative reconstruction technique. CONTRAST:  75mL OMNIPAQUE IOHEXOL 350 MG/ML SOLN COMPARISON:  X-ray earlier 11/24/2023. Chest CT with contrast 07/15/2023.  FINDINGS: Cardiovascular: Heart is enlarged. Small to moderate pericardial effusion again seen, similar to previous. Coronary artery calcifications are identified. The thoracic aorta has a overall normal course and caliber with scattered atherosclerotic changes. There is a bovine type aortic arch. Breathing motion identified which limits evaluation, nondiagnostic for small and peripheral emboli. There is filling defect however along a segmental vessel of the left lower lobe on series 4, image 90 consistent with a small embolus. No larger or more central emboli identified. Mediastinum/Nodes: Dilated esophagus identified. There is some presumed contrast within the lumen of the esophagus or other high attenuation debris. Wall thickening identified. Small thyroid gland. No specific abnormal lymph node enlargement identified in the axillary region or left hilum. There is confluence abnormal soft tissue throughout the mediastinum which has increased from previous. No discrete nodal enlargement but there could be some enlarged nodes within the mediastinum. This also could be treatment related. Lungs/Pleura: Significant breathing motion. Left lung has a trace pleural fluid. No pneumothorax. Patchy parenchymal nodular opacity along the anterior left upper lobe is similar today measuring 15 mm. Series 100, image 52. Additional small nodule in the left lung apex on series 100, image 12 is stable with some surrounding ground-glass. Small right pleural effusion, increased from previous with some pleural thickening. There is significant increase in parenchymal opacity seen in the right lower lobe extending to the hilum. Persistent bandlike changes along the margin of the right side of the mediastinum diffusely with bronchiectasis and distortion. This could be posttreatment related. Areas of interstitial septal thickening in the right hemithorax. No pneumothorax. Stable high density along the pleural region of the right lung base.  Upper Abdomen: Stones in the gallbladder dependently. Preserved adrenal glands. Musculoskeletal: Curvature of the spine with degenerative changes. Increasing anasarca. Review of the MIP images confirms the above findings. Critical Value/emergent results were called by telephone at the time of interpretation on 11/24/2023 at 12:10 pm PST to provider Tanda Rockers , who verbally acknowledged these results. IMPRESSION: Segmental small left lower lobe pulmonary embolism. Increasing edema and soft tissue thickening throughout the mediastinum. Increasing anasarca There is also increasing parenchymal opacity in the right lower lobe. This has a differential including infiltrate, however recommend short follow up to exclude a more aggressive process with the patient's history. Slight increase in tiny right effusion as well. Stable areas of nodularity left upper lobe. Recommend continued surveillance. Enlarged heart with stable pericardial effusion. Dilated esophagus with some luminal high density material. Aortic Atherosclerosis (ICD10-I70.0) and Emphysema (ICD10-J43.9). Electronically Signed   By: Karen Kays M.D.   On: 11/24/2023 15:19   DG Chest Port 1 View  Result Date: 11/24/2023 CLINICAL DATA:  Shortness of breath, body aches and chills. History of prior right thoracotomy and talc pleurodesis for pleural effusion in 2015. EXAM: PORTABLE CHEST 1 VIEW COMPARISON:  08/24/2023 FINDINGS: Stable cardiac enlargement. Stable chronic pleural thickening and parenchymal density at the right lung base likely related to prior pleurodesis and scarring. Component of acute infection involving the right lower lung would be very difficult to exclude. No pulmonary edema or pneumothorax identified. The visualized skeletal structures are unremarkable. IMPRESSION: Stable chronic pleural thickening and parenchymal density at the right lung base likely related to prior pleurodesis and scarring. Component of acute infection involving the  right lower lung would be very difficult to exclude. Stable cardiac enlargement. Electronically Signed   By: Irish Lack M.D.   On: 11/24/2023 11:07    Microbiology: Results for orders placed or performed during the hospital encounter of 11/24/23  Resp panel by RT-PCR (RSV, Flu A&B, Covid) Anterior Nasal Swab     Status: None   Collection Time: 11/24/23 10:27 AM   Specimen: Anterior Nasal Swab  Result Value Ref Range Status   SARS Coronavirus 2 by RT PCR NEGATIVE NEGATIVE Final    Comment: (NOTE) SARS-CoV-2 target nucleic acids are NOT DETECTED.  The SARS-CoV-2 RNA is generally detectable in upper respiratory specimens during the acute phase of infection. The lowest concentration of SARS-CoV-2 viral copies this assay can detect is 138 copies/mL. A negative result does not preclude SARS-Cov-2 infection and should not be used as the sole basis for treatment or other patient management decisions. A negative result may occur with  improper specimen collection/handling, submission of specimen other than nasopharyngeal swab, presence of viral mutation(s) within the areas targeted by this assay, and inadequate number of viral copies(<138 copies/mL). A negative result must be combined with clinical observations, patient history, and epidemiological information. The expected result is Negative.  Fact Sheet for Patients:  BloggerCourse.com  Fact Sheet for Healthcare Providers:  SeriousBroker.it  This test is no t yet approved or cleared by the [place] FDA and  has been authorized for detection and/or diagnosis of SARS-CoV-2 by FDA under an Emergency Use Authorization (EUA). This EUA will remain  in effect (meaning this test can be used) for the duration of the COVID-19 declaration under Section 564(b)(1) of the Act, 21 U.S.C.section 360bbb-3(b)(1), unless the authorization is terminated  or revoked sooner.       Influenza A by  PCR NEGATIVE NEGATIVE Final   Influenza B by PCR NEGATIVE NEGATIVE Final    Comment: (NOTE) The Xpert Xpress SARS-CoV-2/FLU/RSV plus assay is intended as an aid in the diagnosis of influenza from Nasopharyngeal swab specimens and should not be used as a sole basis for treatment. Nasal washings and aspirates are unacceptable for Xpert Xpress SARS-CoV-2/FLU/RSV testing.  Fact Sheet for Patients: BloggerCourse.com  Fact Sheet for Healthcare Providers: SeriousBroker.it  This test is not yet approved or cleared by the [place] FDA and has been authorized for detection and/or diagnosis of SARS-CoV-2 by FDA under an Emergency Use Authorization (EUA). This EUA will remain in effect (meaning this test can be used) for the duration of the COVID-19 declaration under Section 564(b)(1) of the Act, 21 U.S.C. section 360bbb-3(b)(1), unless the authorization is terminated or revoked.     Resp Syncytial Virus by PCR NEGATIVE NEGATIVE Final    Comment: (NOTE) Fact Sheet for Patients: BloggerCourse.com  Fact Sheet for Healthcare Providers: SeriousBroker.it  This test is not yet approved or cleared by the [place] FDA and has been authorized for detection and/or diagnosis of SARS-CoV-2 by FDA under an Emergency Use Authorization (EUA). This EUA will remain in effect (meaning this test can be used)  for the duration of the COVID-19 declaration under Section 564(b)(1) of the Act, 21 U.S.C. section 360bbb-3(b)(1), unless the authorization is terminated or revoked.  Performed at [facility], [address], [place], [place] [address]   MRSA Next Gen by PCR, Nasal     Status: None   Collection Time: 11/24/23  8:32 PM   Specimen: Nasal Mucosa; Nasal Swab  Result Value Ref Range Status   MRSA by PCR Next Gen NOT DETECTED NOT DETECTED Final    Comment: (NOTE) The  GeneXpert MRSA Assay (FDA approved for NASAL specimens only), is one component of a comprehensive MRSA colonization surveillance program. It is not intended to diagnose MRSA infection nor to guide or monitor treatment for MRSA infections. Test performance is not FDA approved in patients less than 39 years old. Performed at [facility], [address], [place], [place] [address]    *Note: Due to a large number of results and/or encounters for the requested time period, some results have not been displayed. A complete set of results can be found in Results Review.    Labs: CBC: Recent Labs  Lab 11/24/23 1040 11/25/23 0134 11/26/23 0538 11/27/23 0540  WBC 10.7* 12.7* 10.2 6.9  NEUTROABS 9.8*  --   --   --   HGB 12.3 11.4* 10.9* 10.9*  HCT 37.8 34.7* 34.6* 32.8*  MCV 95.0 96.4 98.3 94.3  PLT 237 204 263 269   Basic Metabolic Panel: Recent Labs  Lab 11/24/23 1040 11/24/23 1405 11/25/23 0134 11/27/23 0540  NA 139  --  139 139  K 3.3*  --  4.0 3.7  CL 106  --  109 106  CO2 24  --  18* 25  GLUCOSE 136*  --  157* 91  BUN 24*  --  26* 35*  CREATININE 0.80  --  0.80 0.93  CALCIUM 8.0*  --  8.0* 9.2  MG  --  1.5* 2.0  --   PHOS  --  3.2 2.5  --    Liver Function Tests: Recent Labs  Lab 11/24/23 1040 11/25/23 0134  AST 27 24  ALT 25 23  ALKPHOS 41 41  BILITOT 1.0 0.6  PROT 5.6* 5.5*  ALBUMIN 3.2* 3.0*   CBG: No results for input(s): "GLUCAP" in the last 168 hours.  Discharge time spent: greater than 30 minutes.  Signed: Tyrone Nine, MD [facility] 11/27/2023

## 2023-11-27 NOTE — Progress Notes (Signed)
Physical Therapy Treatment Patient Details Name: Dana [name] MRN: [id number] DOB: 1935-06-12 Today's Date: 11/27/2023   History of Present Illness 88 yo female admitted 12/1 with progressively worsening dyspnea associated with productive cough, wheezing, fatigue, chills, body aches, nausea and decreased appetite. POsitive for bilateral PE and CAP.Marland KitchenWGN:FAOZHY, osteoarthritis, rheumatoid arthritis, dyspnea on exertion, depression, GERD, hemorrhoids, anal fissures, nephrolithiasis, osteoporosis, skin rash and skin caner,, hypertension, COPD, history of pneumonia, history of lung cancer, history of radiation pneumonitis    PT Comments  Pt making good progress and is eager to return home (reports supposed to d/c today).  She was able to transfer and ambulate 200' with rollator safely.  Pt reports fatigues easier than baseline but much better than admission.  Will cont POC while hospitalized.     If plan is discharge home, recommend the following: A little help with walking and/or transfers;A little help with bathing/dressing/bathroom;Assist for transportation   Can travel by private vehicle        Equipment Recommendations  None recommended by PT    Recommendations for Other Services       Precautions / Restrictions Precautions Precautions: Fall Restrictions Weight Bearing Restrictions: No     Mobility  Bed Mobility Overal bed mobility: Needs Assistance Bed Mobility: Supine to Sit, Sit to Supine     Supine to sit: Modified independent (Device/Increase time)          Transfers Overall transfer level: Needs assistance Equipment used: Rollator (4 wheels) Transfers: Sit to/from Stand Sit to Stand: Supervision           General transfer comment: demonstrated safely x 2    Ambulation/Gait Ambulation/Gait assistance: Supervision Gait Distance (Feet): 200 Feet Assistive device: Rollator (4 wheels) Gait Pattern/deviations: Step-through pattern Gait velocity:  functional     General Gait Details: Ambulated 200' with Rollator safely; started with CGA progressed to supv   Stairs             Wheelchair Mobility     Tilt Bed    Modified Rankin (Stroke Patients Only)       Balance Overall balance assessment: Needs assistance Sitting-balance support: No upper extremity supported Sitting balance-Leahy Scale: Good     Standing balance support: Bilateral upper extremity supported, No upper extremity supported Standing balance-Leahy Scale: Fair Standing balance comment: could stand without support; Rollator to ambulate                            Cognition Arousal: Alert Behavior During Therapy: WFL for tasks assessed/performed Overall Cognitive Status: Within Functional Limits for tasks assessed                                 General Comments: eager to return home        Exercises      General Comments General comments (skin integrity, edema, etc.): VSS      Pertinent Vitals/Pain Pain Assessment Pain Assessment: No/denies pain    Home Living Family/patient expects to be discharged to:: Private residence Living Arrangements: Spouse/significant other Available Help at Discharge: Family;Available 24 hours/day (husband has memory loss) Type of Home: House Home Access: Level entry       Home Layout: One level Home Equipment: Rollator (4 wheels);Cane - single point;Shower seat;Hand held shower head;Adaptive equipment Additional Comments: family assists with getting groceries and appointments, husband can physically assist, but requires reminders  Prior Function            PT Goals (current goals can now be found in the care plan section) Progress towards PT goals: Progressing toward goals    Frequency    Min 1X/week      PT Plan      Co-evaluation              AM-PAC PT "6 Clicks" Mobility   Outcome Measure  Help needed turning from your back to your side while in  a flat bed without using bedrails?: None Help needed moving from lying on your back to sitting on the side of a flat bed without using bedrails?: A Little Help needed moving to and from a bed to a chair (including a wheelchair)?: A Little Help needed standing up from a chair using your arms (e.g., wheelchair or bedside chair)?: A Little Help needed to walk in hospital room?: A Little Help needed climbing 3-5 steps with a railing? : A Little 6 Click Score: 19    End of Session Equipment Utilized During Treatment: Gait belt Activity Tolerance: Patient tolerated treatment well Patient left: in chair;with call bell/phone within reach;with chair alarm set Nurse Communication: Mobility status PT Visit Diagnosis: Unsteadiness on feet (R26.81);Difficulty in walking, not elsewhere classified (R26.2)     Time: 1610-9604 PT Time Calculation (min) (ACUTE ONLY): 12 min  Charges:    $Gait Training: 8-22 mins PT General Charges $$ ACUTE PT VISIT: 1 Visit                     Anise Salvo, PT Acute Rehab Services [facility] [phone]    Rayetta Humphrey 11/27/2023, 11:51 AM

## 2023-11-28 ENCOUNTER — Ambulatory Visit (HOSPITAL_COMMUNITY): Admission: RE | Admit: 2023-11-28 | Payer: Medicare Other | Source: Home / Self Care | Admitting: Urology

## 2023-11-28 ENCOUNTER — Encounter (HOSPITAL_COMMUNITY): Admission: RE | Payer: Self-pay | Source: Home / Self Care

## 2023-11-28 DIAGNOSIS — M2351 Chronic instability of knee, right knee: Secondary | ICD-10-CM | POA: Diagnosis not present

## 2023-11-28 DIAGNOSIS — M1711 Unilateral primary osteoarthritis, right knee: Secondary | ICD-10-CM | POA: Diagnosis not present

## 2023-11-28 SURGERY — TURBT, WITH CHEMOTHERAPEUTIC AGENT INSTILLATION INTO BLADDER
Anesthesia: General

## 2023-11-29 DIAGNOSIS — I110 Hypertensive heart disease with heart failure: Secondary | ICD-10-CM | POA: Diagnosis not present

## 2023-11-29 DIAGNOSIS — M069 Rheumatoid arthritis, unspecified: Secondary | ICD-10-CM | POA: Diagnosis not present

## 2023-11-29 DIAGNOSIS — M1990 Unspecified osteoarthritis, unspecified site: Secondary | ICD-10-CM | POA: Diagnosis not present

## 2023-11-29 DIAGNOSIS — Z7901 Long term (current) use of anticoagulants: Secondary | ICD-10-CM | POA: Diagnosis not present

## 2023-11-29 DIAGNOSIS — R636 Underweight: Secondary | ICD-10-CM | POA: Diagnosis not present

## 2023-11-29 DIAGNOSIS — I2510 Atherosclerotic heart disease of native coronary artery without angina pectoris: Secondary | ICD-10-CM | POA: Diagnosis not present

## 2023-11-29 DIAGNOSIS — K219 Gastro-esophageal reflux disease without esophagitis: Secondary | ICD-10-CM | POA: Diagnosis not present

## 2023-11-29 DIAGNOSIS — Z556 Problems related to health literacy: Secondary | ICD-10-CM | POA: Diagnosis not present

## 2023-11-29 DIAGNOSIS — J8410 Pulmonary fibrosis, unspecified: Secondary | ICD-10-CM | POA: Diagnosis not present

## 2023-11-29 DIAGNOSIS — I5032 Chronic diastolic (congestive) heart failure: Secondary | ICD-10-CM | POA: Diagnosis not present

## 2023-11-29 DIAGNOSIS — E876 Hypokalemia: Secondary | ICD-10-CM | POA: Diagnosis not present

## 2023-11-29 DIAGNOSIS — Z87891 Personal history of nicotine dependence: Secondary | ICD-10-CM | POA: Diagnosis not present

## 2023-11-29 DIAGNOSIS — J9601 Acute respiratory failure with hypoxia: Secondary | ICD-10-CM | POA: Diagnosis not present

## 2023-11-29 DIAGNOSIS — J440 Chronic obstructive pulmonary disease with acute lower respiratory infection: Secondary | ICD-10-CM | POA: Diagnosis not present

## 2023-11-29 DIAGNOSIS — D649 Anemia, unspecified: Secondary | ICD-10-CM | POA: Diagnosis not present

## 2023-11-29 DIAGNOSIS — M810 Age-related osteoporosis without current pathological fracture: Secondary | ICD-10-CM | POA: Diagnosis not present

## 2023-12-03 ENCOUNTER — Telehealth: Payer: Self-pay | Admitting: Medical Oncology

## 2023-12-03 NOTE — Telephone Encounter (Signed)
New PE and Pneumonia. Started Eloquis. TURP  cancelled by Dr . Robley Fries. What is time frame with Eloquis and having surgery.

## 2023-12-05 DIAGNOSIS — C3491 Malignant neoplasm of unspecified part of right bronchus or lung: Secondary | ICD-10-CM | POA: Diagnosis not present

## 2023-12-05 DIAGNOSIS — D414 Neoplasm of uncertain behavior of bladder: Secondary | ICD-10-CM | POA: Diagnosis not present

## 2023-12-05 DIAGNOSIS — J9601 Acute respiratory failure with hypoxia: Secondary | ICD-10-CM | POA: Diagnosis not present

## 2023-12-05 DIAGNOSIS — J449 Chronic obstructive pulmonary disease, unspecified: Secondary | ICD-10-CM | POA: Diagnosis not present

## 2023-12-05 DIAGNOSIS — I2699 Other pulmonary embolism without acute cor pulmonale: Secondary | ICD-10-CM | POA: Diagnosis not present

## 2023-12-10 ENCOUNTER — Other Ambulatory Visit: Payer: Self-pay

## 2023-12-13 DIAGNOSIS — J440 Chronic obstructive pulmonary disease with acute lower respiratory infection: Secondary | ICD-10-CM | POA: Diagnosis not present

## 2023-12-13 DIAGNOSIS — M069 Rheumatoid arthritis, unspecified: Secondary | ICD-10-CM | POA: Diagnosis not present

## 2023-12-13 DIAGNOSIS — J8410 Pulmonary fibrosis, unspecified: Secondary | ICD-10-CM | POA: Diagnosis not present

## 2023-12-13 DIAGNOSIS — M810 Age-related osteoporosis without current pathological fracture: Secondary | ICD-10-CM | POA: Diagnosis not present

## 2023-12-13 DIAGNOSIS — D649 Anemia, unspecified: Secondary | ICD-10-CM | POA: Diagnosis not present

## 2023-12-13 DIAGNOSIS — I110 Hypertensive heart disease with heart failure: Secondary | ICD-10-CM | POA: Diagnosis not present

## 2023-12-13 DIAGNOSIS — M1990 Unspecified osteoarthritis, unspecified site: Secondary | ICD-10-CM | POA: Diagnosis not present

## 2023-12-13 DIAGNOSIS — I2510 Atherosclerotic heart disease of native coronary artery without angina pectoris: Secondary | ICD-10-CM | POA: Diagnosis not present

## 2023-12-13 DIAGNOSIS — E876 Hypokalemia: Secondary | ICD-10-CM | POA: Diagnosis not present

## 2023-12-13 DIAGNOSIS — R636 Underweight: Secondary | ICD-10-CM | POA: Diagnosis not present

## 2023-12-13 DIAGNOSIS — I5032 Chronic diastolic (congestive) heart failure: Secondary | ICD-10-CM | POA: Diagnosis not present

## 2023-12-13 DIAGNOSIS — Z87891 Personal history of nicotine dependence: Secondary | ICD-10-CM | POA: Diagnosis not present

## 2023-12-13 DIAGNOSIS — K219 Gastro-esophageal reflux disease without esophagitis: Secondary | ICD-10-CM | POA: Diagnosis not present

## 2023-12-13 DIAGNOSIS — Z556 Problems related to health literacy: Secondary | ICD-10-CM | POA: Diagnosis not present

## 2023-12-13 DIAGNOSIS — J9601 Acute respiratory failure with hypoxia: Secondary | ICD-10-CM | POA: Diagnosis not present

## 2023-12-13 DIAGNOSIS — Z7901 Long term (current) use of anticoagulants: Secondary | ICD-10-CM | POA: Diagnosis not present

## 2023-12-16 DIAGNOSIS — I110 Hypertensive heart disease with heart failure: Secondary | ICD-10-CM | POA: Diagnosis not present

## 2023-12-16 DIAGNOSIS — E876 Hypokalemia: Secondary | ICD-10-CM | POA: Diagnosis not present

## 2023-12-16 DIAGNOSIS — J8410 Pulmonary fibrosis, unspecified: Secondary | ICD-10-CM | POA: Diagnosis not present

## 2023-12-16 DIAGNOSIS — D649 Anemia, unspecified: Secondary | ICD-10-CM | POA: Diagnosis not present

## 2023-12-16 DIAGNOSIS — M069 Rheumatoid arthritis, unspecified: Secondary | ICD-10-CM | POA: Diagnosis not present

## 2023-12-16 DIAGNOSIS — J440 Chronic obstructive pulmonary disease with acute lower respiratory infection: Secondary | ICD-10-CM | POA: Diagnosis not present

## 2023-12-16 DIAGNOSIS — M810 Age-related osteoporosis without current pathological fracture: Secondary | ICD-10-CM | POA: Diagnosis not present

## 2023-12-16 DIAGNOSIS — I2510 Atherosclerotic heart disease of native coronary artery without angina pectoris: Secondary | ICD-10-CM | POA: Diagnosis not present

## 2023-12-16 DIAGNOSIS — Z87891 Personal history of nicotine dependence: Secondary | ICD-10-CM | POA: Diagnosis not present

## 2023-12-16 DIAGNOSIS — K219 Gastro-esophageal reflux disease without esophagitis: Secondary | ICD-10-CM | POA: Diagnosis not present

## 2023-12-16 DIAGNOSIS — Z7901 Long term (current) use of anticoagulants: Secondary | ICD-10-CM | POA: Diagnosis not present

## 2023-12-16 DIAGNOSIS — Z556 Problems related to health literacy: Secondary | ICD-10-CM | POA: Diagnosis not present

## 2023-12-16 DIAGNOSIS — I5032 Chronic diastolic (congestive) heart failure: Secondary | ICD-10-CM | POA: Diagnosis not present

## 2023-12-16 DIAGNOSIS — J9601 Acute respiratory failure with hypoxia: Secondary | ICD-10-CM | POA: Diagnosis not present

## 2023-12-16 DIAGNOSIS — M1990 Unspecified osteoarthritis, unspecified site: Secondary | ICD-10-CM | POA: Diagnosis not present

## 2023-12-16 DIAGNOSIS — R636 Underweight: Secondary | ICD-10-CM | POA: Diagnosis not present

## 2023-12-19 NOTE — Telephone Encounter (Signed)
Will need to re-run VOB for 2025.

## 2023-12-20 DIAGNOSIS — I5032 Chronic diastolic (congestive) heart failure: Secondary | ICD-10-CM | POA: Diagnosis not present

## 2023-12-20 DIAGNOSIS — J8410 Pulmonary fibrosis, unspecified: Secondary | ICD-10-CM | POA: Diagnosis not present

## 2023-12-20 DIAGNOSIS — M069 Rheumatoid arthritis, unspecified: Secondary | ICD-10-CM | POA: Diagnosis not present

## 2023-12-20 DIAGNOSIS — D649 Anemia, unspecified: Secondary | ICD-10-CM | POA: Diagnosis not present

## 2023-12-20 DIAGNOSIS — I2510 Atherosclerotic heart disease of native coronary artery without angina pectoris: Secondary | ICD-10-CM | POA: Diagnosis not present

## 2023-12-20 DIAGNOSIS — M1990 Unspecified osteoarthritis, unspecified site: Secondary | ICD-10-CM | POA: Diagnosis not present

## 2023-12-20 DIAGNOSIS — I110 Hypertensive heart disease with heart failure: Secondary | ICD-10-CM | POA: Diagnosis not present

## 2023-12-20 DIAGNOSIS — R636 Underweight: Secondary | ICD-10-CM | POA: Diagnosis not present

## 2023-12-20 DIAGNOSIS — K219 Gastro-esophageal reflux disease without esophagitis: Secondary | ICD-10-CM | POA: Diagnosis not present

## 2023-12-20 DIAGNOSIS — Z7901 Long term (current) use of anticoagulants: Secondary | ICD-10-CM | POA: Diagnosis not present

## 2023-12-20 DIAGNOSIS — Z556 Problems related to health literacy: Secondary | ICD-10-CM | POA: Diagnosis not present

## 2023-12-20 DIAGNOSIS — J440 Chronic obstructive pulmonary disease with acute lower respiratory infection: Secondary | ICD-10-CM | POA: Diagnosis not present

## 2023-12-20 DIAGNOSIS — J9601 Acute respiratory failure with hypoxia: Secondary | ICD-10-CM | POA: Diagnosis not present

## 2023-12-20 DIAGNOSIS — M810 Age-related osteoporosis without current pathological fracture: Secondary | ICD-10-CM | POA: Diagnosis not present

## 2023-12-20 DIAGNOSIS — Z87891 Personal history of nicotine dependence: Secondary | ICD-10-CM | POA: Diagnosis not present

## 2023-12-20 DIAGNOSIS — E876 Hypokalemia: Secondary | ICD-10-CM | POA: Diagnosis not present

## 2023-12-24 ENCOUNTER — Other Ambulatory Visit (HOSPITAL_COMMUNITY): Payer: Self-pay

## 2023-12-24 NOTE — Progress Notes (Signed)
 Specialty Pharmacy Refill Coordination Note  Dana [name] is a 88 y.o. female contacted today regarding refills of specialty medication(s) Erlotinib  HCl (TARCEVA )   Patient requested Delivery   Delivery date: 12/31/23   Verified address: [address] [place], [place], [place]   Medication will be filled on 12/30/23.    Clinical Intervention Note  Clinical Intervention Notes: started Eliquis , no DDIs identified   Clinical Intervention Outcomes: Prevention of an adverse drug event   Dana [name]

## 2023-12-26 DIAGNOSIS — Z7901 Long term (current) use of anticoagulants: Secondary | ICD-10-CM | POA: Diagnosis not present

## 2023-12-26 DIAGNOSIS — K219 Gastro-esophageal reflux disease without esophagitis: Secondary | ICD-10-CM | POA: Diagnosis not present

## 2023-12-26 DIAGNOSIS — I2510 Atherosclerotic heart disease of native coronary artery without angina pectoris: Secondary | ICD-10-CM | POA: Diagnosis not present

## 2023-12-26 DIAGNOSIS — J440 Chronic obstructive pulmonary disease with acute lower respiratory infection: Secondary | ICD-10-CM | POA: Diagnosis not present

## 2023-12-26 DIAGNOSIS — Z87891 Personal history of nicotine dependence: Secondary | ICD-10-CM | POA: Diagnosis not present

## 2023-12-26 DIAGNOSIS — I110 Hypertensive heart disease with heart failure: Secondary | ICD-10-CM | POA: Diagnosis not present

## 2023-12-26 DIAGNOSIS — E876 Hypokalemia: Secondary | ICD-10-CM | POA: Diagnosis not present

## 2023-12-26 DIAGNOSIS — J9601 Acute respiratory failure with hypoxia: Secondary | ICD-10-CM | POA: Diagnosis not present

## 2023-12-26 DIAGNOSIS — I5032 Chronic diastolic (congestive) heart failure: Secondary | ICD-10-CM | POA: Diagnosis not present

## 2023-12-26 DIAGNOSIS — M810 Age-related osteoporosis without current pathological fracture: Secondary | ICD-10-CM | POA: Diagnosis not present

## 2023-12-26 DIAGNOSIS — R636 Underweight: Secondary | ICD-10-CM | POA: Diagnosis not present

## 2023-12-26 DIAGNOSIS — M1990 Unspecified osteoarthritis, unspecified site: Secondary | ICD-10-CM | POA: Diagnosis not present

## 2023-12-26 DIAGNOSIS — M069 Rheumatoid arthritis, unspecified: Secondary | ICD-10-CM | POA: Diagnosis not present

## 2023-12-26 DIAGNOSIS — J8410 Pulmonary fibrosis, unspecified: Secondary | ICD-10-CM | POA: Diagnosis not present

## 2023-12-26 DIAGNOSIS — Z556 Problems related to health literacy: Secondary | ICD-10-CM | POA: Diagnosis not present

## 2023-12-26 DIAGNOSIS — D649 Anemia, unspecified: Secondary | ICD-10-CM | POA: Diagnosis not present

## 2023-12-26 NOTE — Telephone Encounter (Signed)
 VOB initiated for 2025.

## 2023-12-27 DIAGNOSIS — D414 Neoplasm of uncertain behavior of bladder: Secondary | ICD-10-CM | POA: Diagnosis not present

## 2023-12-30 ENCOUNTER — Other Ambulatory Visit: Payer: Self-pay

## 2023-12-30 NOTE — Telephone Encounter (Signed)
 Medical [employer] and [employer] - Prior Authorization NOT required   PBM [employer] - Prior Authorization REQUIRED

## 2023-12-31 DIAGNOSIS — I499 Cardiac arrhythmia, unspecified: Secondary | ICD-10-CM | POA: Diagnosis not present

## 2023-12-31 DIAGNOSIS — Z8551 Personal history of malignant neoplasm of bladder: Secondary | ICD-10-CM | POA: Diagnosis not present

## 2023-12-31 DIAGNOSIS — R0602 Shortness of breath: Secondary | ICD-10-CM | POA: Diagnosis not present

## 2023-12-31 DIAGNOSIS — Z79899 Other long term (current) drug therapy: Secondary | ICD-10-CM | POA: Diagnosis not present

## 2023-12-31 DIAGNOSIS — R0989 Other specified symptoms and signs involving the circulatory and respiratory systems: Secondary | ICD-10-CM | POA: Diagnosis not present

## 2023-12-31 DIAGNOSIS — Z7901 Long term (current) use of anticoagulants: Secondary | ICD-10-CM | POA: Diagnosis not present

## 2023-12-31 DIAGNOSIS — R918 Other nonspecific abnormal finding of lung field: Secondary | ICD-10-CM | POA: Diagnosis not present

## 2023-12-31 DIAGNOSIS — Z86711 Personal history of pulmonary embolism: Secondary | ICD-10-CM | POA: Diagnosis not present

## 2023-12-31 DIAGNOSIS — R6889 Other general symptoms and signs: Secondary | ICD-10-CM | POA: Diagnosis not present

## 2023-12-31 DIAGNOSIS — I6782 Cerebral ischemia: Secondary | ICD-10-CM | POA: Diagnosis not present

## 2023-12-31 DIAGNOSIS — Z85118 Personal history of other malignant neoplasm of bronchus and lung: Secondary | ICD-10-CM | POA: Diagnosis not present

## 2023-12-31 DIAGNOSIS — E860 Dehydration: Secondary | ICD-10-CM | POA: Diagnosis not present

## 2023-12-31 DIAGNOSIS — R55 Syncope and collapse: Secondary | ICD-10-CM | POA: Diagnosis not present

## 2023-12-31 DIAGNOSIS — D649 Anemia, unspecified: Secondary | ICD-10-CM | POA: Diagnosis not present

## 2023-12-31 DIAGNOSIS — R404 Transient alteration of awareness: Secondary | ICD-10-CM | POA: Diagnosis not present

## 2023-12-31 DIAGNOSIS — Z23 Encounter for immunization: Secondary | ICD-10-CM | POA: Diagnosis not present

## 2023-12-31 DIAGNOSIS — Z743 Need for continuous supervision: Secondary | ICD-10-CM | POA: Diagnosis not present

## 2023-12-31 DIAGNOSIS — S0181XA Laceration without foreign body of other part of head, initial encounter: Secondary | ICD-10-CM | POA: Diagnosis not present

## 2023-12-31 DIAGNOSIS — J90 Pleural effusion, not elsewhere classified: Secondary | ICD-10-CM | POA: Diagnosis not present

## 2023-12-31 DIAGNOSIS — E869 Volume depletion, unspecified: Secondary | ICD-10-CM | POA: Diagnosis not present

## 2023-12-31 DIAGNOSIS — R58 Hemorrhage, not elsewhere classified: Secondary | ICD-10-CM | POA: Diagnosis not present

## 2023-12-31 DIAGNOSIS — R000 Tachycardia, unspecified: Secondary | ICD-10-CM | POA: Diagnosis not present

## 2023-12-31 DIAGNOSIS — G319 Degenerative disease of nervous system, unspecified: Secondary | ICD-10-CM | POA: Diagnosis not present

## 2023-12-31 DIAGNOSIS — J984 Other disorders of lung: Secondary | ICD-10-CM | POA: Diagnosis not present

## 2023-12-31 DIAGNOSIS — T797XXA Traumatic subcutaneous emphysema, initial encounter: Secondary | ICD-10-CM | POA: Diagnosis not present

## 2024-01-01 DIAGNOSIS — D649 Anemia, unspecified: Secondary | ICD-10-CM | POA: Diagnosis not present

## 2024-01-01 DIAGNOSIS — I444 Left anterior fascicular block: Secondary | ICD-10-CM | POA: Diagnosis not present

## 2024-01-01 DIAGNOSIS — R000 Tachycardia, unspecified: Secondary | ICD-10-CM | POA: Diagnosis not present

## 2024-01-01 DIAGNOSIS — S0181XA Laceration without foreign body of other part of head, initial encounter: Secondary | ICD-10-CM | POA: Diagnosis not present

## 2024-01-01 DIAGNOSIS — R55 Syncope and collapse: Secondary | ICD-10-CM | POA: Diagnosis not present

## 2024-01-02 ENCOUNTER — Telehealth: Payer: Self-pay | Admitting: Medical Oncology

## 2024-01-02 ENCOUNTER — Other Ambulatory Visit: Payer: Self-pay | Admitting: Urology

## 2024-01-02 NOTE — Telephone Encounter (Signed)
 Prior Authorization initiated for [facility] via [email or web address] KEY: [id number]

## 2024-01-02 NOTE — Telephone Encounter (Signed)
  Dtr called and said pt's  recent CT angio on Monday @  [facility] was negative for PE .  Does she need to continue her Eloquis ?    I told Dana [name] that Dana [name] will need to stay on Eloquis  for at least 3 months after PE diagnosed.  I will confirm with Dr. Sherrod.

## 2024-01-03 ENCOUNTER — Telehealth: Payer: Self-pay

## 2024-01-03 NOTE — Telephone Encounter (Signed)
 Dr. Sari Cable called the office and was wondering if it was okay for patient to stop Eliquis  for surgery.  She states that she doesn't feel like she should make that decision. The surgical clearance form was sent to her office.  Will route this message to Dr. Sherrod for recommendations.

## 2024-01-06 ENCOUNTER — Telehealth: Payer: Self-pay

## 2024-01-06 NOTE — Telephone Encounter (Signed)
 Spoke with patients daughter Dagoberto. Patient is suppose to have surgery on 1/30. Dr. Lenora called our office about proceeding with Eliquis . Per Dr. Sherrod- continue Eliquis  and stop 2 days before surgery and resume 1 day after surgery.    Rescheduled patients appt (1/30) with Dr. Sherrod to 1/27.  Informed patients daughter if they need anything before to give us  a call.

## 2024-01-08 DIAGNOSIS — R636 Underweight: Secondary | ICD-10-CM | POA: Diagnosis not present

## 2024-01-08 DIAGNOSIS — J9601 Acute respiratory failure with hypoxia: Secondary | ICD-10-CM | POA: Diagnosis not present

## 2024-01-08 DIAGNOSIS — Z87891 Personal history of nicotine dependence: Secondary | ICD-10-CM | POA: Diagnosis not present

## 2024-01-08 DIAGNOSIS — Z7901 Long term (current) use of anticoagulants: Secondary | ICD-10-CM | POA: Diagnosis not present

## 2024-01-08 DIAGNOSIS — J8410 Pulmonary fibrosis, unspecified: Secondary | ICD-10-CM | POA: Diagnosis not present

## 2024-01-08 DIAGNOSIS — M810 Age-related osteoporosis without current pathological fracture: Secondary | ICD-10-CM | POA: Diagnosis not present

## 2024-01-08 DIAGNOSIS — I110 Hypertensive heart disease with heart failure: Secondary | ICD-10-CM | POA: Diagnosis not present

## 2024-01-08 DIAGNOSIS — M1990 Unspecified osteoarthritis, unspecified site: Secondary | ICD-10-CM | POA: Diagnosis not present

## 2024-01-08 DIAGNOSIS — D649 Anemia, unspecified: Secondary | ICD-10-CM | POA: Diagnosis not present

## 2024-01-08 DIAGNOSIS — I5032 Chronic diastolic (congestive) heart failure: Secondary | ICD-10-CM | POA: Diagnosis not present

## 2024-01-08 DIAGNOSIS — I2510 Atherosclerotic heart disease of native coronary artery without angina pectoris: Secondary | ICD-10-CM | POA: Diagnosis not present

## 2024-01-08 DIAGNOSIS — K219 Gastro-esophageal reflux disease without esophagitis: Secondary | ICD-10-CM | POA: Diagnosis not present

## 2024-01-08 DIAGNOSIS — J440 Chronic obstructive pulmonary disease with acute lower respiratory infection: Secondary | ICD-10-CM | POA: Diagnosis not present

## 2024-01-08 DIAGNOSIS — E876 Hypokalemia: Secondary | ICD-10-CM | POA: Diagnosis not present

## 2024-01-08 DIAGNOSIS — Z556 Problems related to health literacy: Secondary | ICD-10-CM | POA: Diagnosis not present

## 2024-01-08 DIAGNOSIS — M069 Rheumatoid arthritis, unspecified: Secondary | ICD-10-CM | POA: Diagnosis not present

## 2024-01-08 NOTE — Telephone Encounter (Signed)
 Prior Authorization for ZILRETTA  APPROVED PA# [id number] Valid: 01/02/24-12/23/24

## 2024-01-10 DIAGNOSIS — Z681 Body mass index (BMI) 19 or less, adult: Secondary | ICD-10-CM | POA: Diagnosis not present

## 2024-01-10 DIAGNOSIS — Z4802 Encounter for removal of sutures: Secondary | ICD-10-CM | POA: Diagnosis not present

## 2024-01-10 DIAGNOSIS — S1191XD Laceration without foreign body of unspecified part of neck, subsequent encounter: Secondary | ICD-10-CM | POA: Diagnosis not present

## 2024-01-14 DIAGNOSIS — D649 Anemia, unspecified: Secondary | ICD-10-CM | POA: Diagnosis not present

## 2024-01-14 DIAGNOSIS — K219 Gastro-esophageal reflux disease without esophagitis: Secondary | ICD-10-CM | POA: Diagnosis not present

## 2024-01-14 DIAGNOSIS — M069 Rheumatoid arthritis, unspecified: Secondary | ICD-10-CM | POA: Diagnosis not present

## 2024-01-14 DIAGNOSIS — R636 Underweight: Secondary | ICD-10-CM | POA: Diagnosis not present

## 2024-01-14 DIAGNOSIS — M810 Age-related osteoporosis without current pathological fracture: Secondary | ICD-10-CM | POA: Diagnosis not present

## 2024-01-14 DIAGNOSIS — Z87891 Personal history of nicotine dependence: Secondary | ICD-10-CM | POA: Diagnosis not present

## 2024-01-14 DIAGNOSIS — I110 Hypertensive heart disease with heart failure: Secondary | ICD-10-CM | POA: Diagnosis not present

## 2024-01-14 DIAGNOSIS — J440 Chronic obstructive pulmonary disease with acute lower respiratory infection: Secondary | ICD-10-CM | POA: Diagnosis not present

## 2024-01-14 DIAGNOSIS — Z7901 Long term (current) use of anticoagulants: Secondary | ICD-10-CM | POA: Diagnosis not present

## 2024-01-14 DIAGNOSIS — I5032 Chronic diastolic (congestive) heart failure: Secondary | ICD-10-CM | POA: Diagnosis not present

## 2024-01-14 DIAGNOSIS — J9601 Acute respiratory failure with hypoxia: Secondary | ICD-10-CM | POA: Diagnosis not present

## 2024-01-14 DIAGNOSIS — J8410 Pulmonary fibrosis, unspecified: Secondary | ICD-10-CM | POA: Diagnosis not present

## 2024-01-14 DIAGNOSIS — M1990 Unspecified osteoarthritis, unspecified site: Secondary | ICD-10-CM | POA: Diagnosis not present

## 2024-01-14 DIAGNOSIS — E876 Hypokalemia: Secondary | ICD-10-CM | POA: Diagnosis not present

## 2024-01-14 DIAGNOSIS — I2510 Atherosclerotic heart disease of native coronary artery without angina pectoris: Secondary | ICD-10-CM | POA: Diagnosis not present

## 2024-01-14 DIAGNOSIS — Z556 Problems related to health literacy: Secondary | ICD-10-CM | POA: Diagnosis not present

## 2024-01-15 ENCOUNTER — Other Ambulatory Visit: Payer: Self-pay | Admitting: Physician Assistant

## 2024-01-15 DIAGNOSIS — R634 Abnormal weight loss: Secondary | ICD-10-CM

## 2024-01-16 ENCOUNTER — Other Ambulatory Visit: Payer: Self-pay

## 2024-01-16 NOTE — Progress Notes (Signed)
Specialty Pharmacy Refill Coordination Note  ELINA BLASE is a 89 y.o. female contacted today regarding refills of specialty medication(s) Erlotinib HCl [facility])   Patient requested Delivery   Delivery date: 01/27/24   Verified address: [address]   [place] [address]   Medication will be filled on 01/24/24.

## 2024-01-17 ENCOUNTER — Ambulatory Visit (HOSPITAL_COMMUNITY): Payer: Medicare Other

## 2024-01-17 ENCOUNTER — Inpatient Hospital Stay: Payer: Medicare Other

## 2024-01-17 ENCOUNTER — Other Ambulatory Visit: Payer: Self-pay

## 2024-01-18 MED ORDER — LACTATED RINGERS IV SOLN
INTRAVENOUS | Status: DC
Start: 2024-01-18 — End: 2024-01-18

## 2024-01-18 MED ORDER — CHLORHEXIDINE GLUCONATE 0.12 % MT SOLN: 15.0000 mL | Freq: Once | OROMUCOSAL | Status: DC

## 2024-01-18 MED ORDER — ORAL CARE MOUTH RINSE: 15.0000 mL | Freq: Once | OROMUCOSAL | Status: DC

## 2024-01-18 NOTE — Progress Notes (Signed)
COVID Vaccine received:  []  No [x]  Yes Date of any COVID positive Test in last 90 days:  PCP -   Gweneth Dimitri, MD  Cardiologist - Charlton Haws, MD  Oncology- Dr. Si Gaul  preop appt 01-20-24  Chest x-ray - 11-24-2023  1v   CTA chest PE 11-24-2023 EKG -  11-24-2023  Epic Stress Test -07-23-2020  Eugenie Birks   Epic  ECHO - 11-25-2023  Epic Cardiac Cath -   PCR screen: []  Ordered & Completed []   No Order but Needs PROFEND     [x]   N/A for this surgery  Surgery Plan:  [x]  Ambulatory   []  Outpatient in bed  []  Admit Anesthesia:    [x]  General  []  Spinal  []   Choice []   MAC  Bowel Prep - [x]  No  []   Yes ______  Pacemaker / ICD device [x]  No []  Yes   Spinal Cord Stimulator:[x]  No []  Yes       History of Sleep Apnea? [x]  No []  Yes   CPAP used?- [x]  No []  Yes    Does the patient monitor blood sugar?   [x]  N/A   []  No []  Yes  Patient has: [x]  NO Hx DM   []  Pre-DM   []  DM1  []   DM2  Blood Thinner / Instructions:  ELiquis Hold x 2 days per 01-06-24 telephone note Aspirin Instructions:none  ERAS Protocol Ordered: [x]  No  []  Yes Patient is to be NPO after: midnight prior  Dental hx: []  Dentures:  []  N/A      []  Bridge or Partial:                   []  Loose or Damaged teeth:   Comments:   Activity level: Patient is able / unable to climb a flight of stairs without difficulty; []  No CP  []  No SOB, but would have ___   Patient can / can not perform ADLs without assistance.   Anesthesia review: RA, Sjogren's Syn., CHF, Hx PE, GERD, anemia, HTN, COPD- hx lung Cancer, PONV.   Patient denies shortness of breath, fever, cough and chest pain at PAT appointment.  Patient verbalized understanding and agreement to the Pre-Surgical Instructions that were given to them at this PAT appointment. Patient was also educated of the need to review these PAT instructions again prior to her surgery.I reviewed the appropriate phone numbers to call if they have any and questions or concerns.

## 2024-01-19 NOTE — Patient Instructions (Addendum)
SURGICAL WAITING ROOM VISITATION Patients having surgery or a procedure may have no more than 2 support people in the waiting area - these visitors may rotate in the visitor waiting room.   Due to an increase in RSV and influenza rates and associated hospitalizations, children ages 40 and under may not visit patients in [facility] hospitals. If the patient needs to stay at the hospital during part of their recovery, the visitor guidelines for inpatient rooms apply.  PRE-OP VISITATION  Pre-op nurse will coordinate an appropriate time for 1 support person to accompany the patient in pre-op.  This support person may not rotate.  This visitor will be contacted when the time is appropriate for the visitor to come back in the pre-op area.  Please refer to the [facility] website for the visitor guidelines for Inpatients (after your surgery is over and you are in a regular room).  You are not required to quarantine at this time prior to your surgery. However, you must do this: Hand Hygiene often Do NOT share personal items Notify your provider if you are in close contact with someone who has COVID or you develop fever 100.4 or greater, new onset of sneezing, cough, sore throat, shortness of breath or body aches.  If you test positive for Covid or have been in contact with anyone that has tested positive in the last 10 days please notify you surgeon.    Your procedure is scheduled on:  Thursday  January 23, 2024  Report to [facility] Main Entrance: Leota Jacobsen entrance where the [employer] is available.   Report to admitting at: 08:15    AM  Call this number if you have any questions or problems the morning of surgery [phone]  DO NOT EAT OR DRINK ANYTHING AFTER MIDNIGHT THE NIGHT PRIOR TO YOUR SURGERY / PROCEDURE.   FOLLOW  ANY ADDITIONAL PRE OP INSTRUCTIONS YOU RECEIVED FROM YOUR SURGEON'S OFFICE!!!   Oral Hygiene is also important to reduce your risk of infection.         Remember - BRUSH YOUR TEETH THE MORNING OF SURGERY WITH YOUR REGULAR TOOTHPASTE  Do NOT smoke after Midnight the night before surgery.  STOP TAKING all Vitamins, Herbs and supplements 1 week before your surgery.   Take ONLY these medicines the morning of surgery with A SIP OF WATER: Tarceva, and Tylenol if needed. You may use your Eye Drops and your Stiolto Inhaler.                    You may not have any metal on your body including hair pins, jewelry, and body piercing  Do not wear make-up, lotions, powders, perfumes or deodorant  Do not wear nail polish including gel and S&S, artificial / acrylic nails, or any other type of covering on natural nails including finger and toenails. If you have artificial nails, gel coating, etc., that needs to be removed by a nail salon, Please have this removed prior to surgery. Not doing so may mean that your surgery could be cancelled or delayed if the Surgeon or anesthesia staff feels like they are unable to monitor you safely.   Do not shave 48 hours prior to surgery to avoid nicks in your skin which may contribute to postoperative infections.    Contacts, Hearing Aids, dentures or bridgework may not be worn into surgery. DENTURES WILL BE REMOVED PRIOR TO SURGERY PLEASE DO NOT APPLY "Poly grip" OR ADHESIVES!!!  Patients discharged on the  day of surgery will not be allowed to drive home.  Someone NEEDS to stay with you for the first 24 hours after anesthesia.  Do not bring your home medications to the hospital. The Pharmacy will dispense medications listed on your medication list to you during your admission in the Hospital.  Please read over the following fact sheets you were given: IF YOU HAVE QUESTIONS ABOUT YOUR PRE-OP INSTRUCTIONS, PLEASE CALL [phone].   [place] - Preparing for Surgery Before surgery, you can play an important role.  Because skin is not sterile, your skin needs to be as free of germs as possible.  You can reduce the  number of germs on your skin by washing with CHG (chlorahexidine gluconate) soap before surgery.  CHG is an antiseptic cleaner which kills germs and bonds with the skin to continue killing germs even after washing. Please DO NOT use if you have an allergy to CHG or antibacterial soaps.  If your skin becomes reddened/irritated stop using the CHG and inform your nurse when you arrive at Short Stay. Do not shave (including legs and underarms) for at least 48 hours prior to the first CHG shower.  You may shave your face/neck.  Please follow these instructions carefully:  1.  Shower with CHG Soap the night before surgery and the  morning of surgery.  2.  If you choose to wash your hair, wash your hair first as usual with your normal  shampoo.  3.  After you shampoo, rinse your hair and body thoroughly to remove the shampoo.                             4.  Use CHG as you would any other liquid soap.  You can apply chg directly to the skin and wash.  Gently with a scrungie or clean washcloth.  5.  Apply the CHG Soap to your body ONLY FROM THE NECK DOWN.   Do not use on face/ open                           Wound or open sores. Avoid contact with eyes, ears mouth and genitals (private parts).                       Wash face,  Genitals (private parts) with your normal soap.             6.  Wash thoroughly, paying special attention to the area where your  surgery  will be performed.  7.  Thoroughly rinse your body with warm water from the neck down.  8.  DO NOT shower/wash with your normal soap after using and rinsing off the CHG Soap.            9.  Pat yourself dry with a clean towel.            10.  Wear clean pajamas.            11.  Place clean sheets on your bed the night of your first shower and do not  sleep with pets.  ON THE DAY OF SURGERY : Do not apply any lotions/deodorants the morning of surgery.  Please wear clean clothes to the hospital/surgery center.    FAILURE TO FOLLOW THESE  INSTRUCTIONS MAY RESULT IN THE CANCELLATION OF YOUR SURGERY  PATIENT SIGNATURE_________________________________  NURSE SIGNATURE__________________________________  ________________________________________________________________________

## 2024-01-20 ENCOUNTER — Encounter (HOSPITAL_COMMUNITY)
Admission: RE | Admit: 2024-01-20 | Discharge: 2024-01-20 | Disposition: A | Discharge status: Home / Self Care | Payer: Medicare Other | Source: Ambulatory Visit | Attending: Urology

## 2024-01-20 ENCOUNTER — Inpatient Hospital Stay: Payer: Medicare Other

## 2024-01-20 ENCOUNTER — Encounter (HOSPITAL_COMMUNITY): Payer: Self-pay

## 2024-01-20 ENCOUNTER — Inpatient Hospital Stay: Payer: Medicare Other | Attending: Internal Medicine | Admitting: Internal Medicine

## 2024-01-20 ENCOUNTER — Other Ambulatory Visit: Payer: Self-pay

## 2024-01-20 VITALS — BP 131/96 | HR 113 | Temp 99.8°F | Resp 16 | Ht 62.0 in | Wt 103.8 lb

## 2024-01-20 VITALS — BP 116/58 | HR 106 | Temp 98.2°F | Resp 14 | Ht 62.0 in | Wt 100.0 lb

## 2024-01-20 DIAGNOSIS — I10 Essential (primary) hypertension: Secondary | ICD-10-CM | POA: Diagnosis not present

## 2024-01-20 DIAGNOSIS — Z923 Personal history of irradiation: Secondary | ICD-10-CM | POA: Diagnosis not present

## 2024-01-20 DIAGNOSIS — Z85118 Personal history of other malignant neoplasm of bronchus and lung: Secondary | ICD-10-CM | POA: Diagnosis not present

## 2024-01-20 DIAGNOSIS — F32A Depression, unspecified: Secondary | ICD-10-CM | POA: Diagnosis not present

## 2024-01-20 DIAGNOSIS — R197 Diarrhea, unspecified: Secondary | ICD-10-CM | POA: Insufficient documentation

## 2024-01-20 DIAGNOSIS — N3289 Other specified disorders of bladder: Secondary | ICD-10-CM | POA: Diagnosis not present

## 2024-01-20 DIAGNOSIS — C3490 Malignant neoplasm of unspecified part of unspecified bronchus or lung: Secondary | ICD-10-CM | POA: Insufficient documentation

## 2024-01-20 DIAGNOSIS — Z01818 Encounter for other preprocedural examination: Secondary | ICD-10-CM

## 2024-01-20 DIAGNOSIS — C3491 Malignant neoplasm of unspecified part of right bronchus or lung: Secondary | ICD-10-CM

## 2024-01-20 DIAGNOSIS — Z79899 Other long term (current) drug therapy: Secondary | ICD-10-CM | POA: Insufficient documentation

## 2024-01-20 DIAGNOSIS — R296 Repeated falls: Secondary | ICD-10-CM | POA: Insufficient documentation

## 2024-01-20 DIAGNOSIS — Z1389 Encounter for screening for other disorder: Secondary | ICD-10-CM | POA: Diagnosis not present

## 2024-01-20 DIAGNOSIS — E860 Dehydration: Secondary | ICD-10-CM | POA: Insufficient documentation

## 2024-01-20 DIAGNOSIS — J449 Chronic obstructive pulmonary disease, unspecified: Secondary | ICD-10-CM | POA: Diagnosis not present

## 2024-01-20 DIAGNOSIS — D494 Neoplasm of unspecified behavior of bladder: Secondary | ICD-10-CM | POA: Diagnosis not present

## 2024-01-20 DIAGNOSIS — Z01812 Encounter for preprocedural laboratory examination: Secondary | ICD-10-CM | POA: Insufficient documentation

## 2024-01-20 DIAGNOSIS — Z7901 Long term (current) use of anticoagulants: Secondary | ICD-10-CM | POA: Insufficient documentation

## 2024-01-20 DIAGNOSIS — Z86711 Personal history of pulmonary embolism: Secondary | ICD-10-CM | POA: Diagnosis not present

## 2024-01-20 DIAGNOSIS — R21 Rash and other nonspecific skin eruption: Secondary | ICD-10-CM | POA: Diagnosis not present

## 2024-01-20 DIAGNOSIS — G4700 Insomnia, unspecified: Secondary | ICD-10-CM | POA: Insufficient documentation

## 2024-01-20 HISTORY — DX: Heart failure, unspecified: I50.9

## 2024-01-20 HISTORY — DX: Personal history of pulmonary embolism: Z86.711

## 2024-01-20 LAB — COMPREHENSIVE METABOLIC PANEL
ALT: 33 U/L (ref 0–44)
AST: 33 U/L (ref 15–41)
Albumin: 3.3 g/dL — ABNORMAL LOW (ref 3.5–5.0)
Alkaline Phosphatase: 45 U/L (ref 38–126)
Anion gap: 11 (ref 5–15)
BUN: 37 mg/dL — ABNORMAL HIGH (ref 8–23)
CO2: 26 mmol/L (ref 22–32)
Calcium: 9.4 mg/dL (ref 8.9–10.3)
Chloride: 105 mmol/L (ref 98–111)
Creatinine, Ser: 0.74 mg/dL (ref 0.44–1.00)
GFR, Estimated: >60 mL/min (ref >60)
Glucose, Bld: 108 mg/dL — ABNORMAL HIGH (ref 70–99)
Potassium: 4.3 mmol/L (ref 3.5–5.1)
Sodium: 142 mmol/L (ref 135–145)
Total Bilirubin: 0.6 mg/dL (ref 0.0–1.2)
Total Protein: 6.2 g/dL — ABNORMAL LOW (ref 6.5–8.1)

## 2024-01-20 LAB — CBC
HCT: 33.2 % — ABNORMAL LOW (ref 36.0–46.0)
Hemoglobin: 10.6 g/dL — ABNORMAL LOW (ref 12.0–15.0)
MCH: 31.2 pg (ref 26.0–34.0)
MCHC: 31.9 g/dL (ref 30.0–36.0)
MCV: 97.6 fL (ref 80.0–100.0)
Platelets: 437 10*3/uL — ABNORMAL HIGH (ref 150–400)
RBC: 3.4 MIL/uL — ABNORMAL LOW (ref 3.87–5.11)
RDW: 15.4 % (ref 11.5–15.5)
WBC: 7.5 10*3/uL (ref 4.0–10.5)
nRBC: 0 % (ref 0.0–0.2)

## 2024-01-20 NOTE — Telephone Encounter (Signed)
Zilretta for RIGHT knee OA   Medical Buy and Bill  Primary Insurance: [facility] Medicare HMO POS Co-pay: $20 Co-insurance: 20% Deductible: does not apply Prior Auth: NOT required  Pharmacy Benefit - [facility]  Primary Insurance: [employer] Medicare Adv / OPTUMRX Co-pay: $203.20 Co-insurance: n/a Deductible: $0 of $255 Prior Auth: APPROVED PA# [id number] Valid: 01/02/24-12/23/24   Can sent to [facility] if using pharmacy benefits    Knee Injection History 05/16/23 - Zilretta RIGHT

## 2024-01-20 NOTE — Progress Notes (Signed)
[facility] [phone]   [phone]  OFFICE PROGRESS NOTE  Gweneth Dimitri, MD [address] [place] [address]  DIAGNOSIS: Stage IIIB (T2a, N3, M0) non-small cell lung cancer, adenocarcinoma with positive EGFR mutation diagnosed in January 2010.  PRIOR THERAPY: 1) Status post concurrent chemoradiation with weekly carboplatin and paclitaxel; last dose given March 21, 2009.  2) Tarceva at 150 mg p.o. daily, status post approximately 48 months of treatment, discontinued secondary to persistent diarrhea.  3) status post right Pleurx catheter placement for right nonmalignant pleural effusion.  CURRENT THERAPY: Tarceva 100 mg by mouth daily started 03/19/2013, status post 130 months of treatment.  INTERVAL HISTORY: Dana [name] 89 y.o. female returns to the clinic today for follow-up visit accompanied by her daughter-in-law.Discussed the use of AI scribe software for clinical note transcription with the patient, who gave verbal consent to proceed.  History of Present Illness   The patient, an 89 year old with a 15-year history of stage 3B non-small cell lung cancer (adenocarcinoma), has been on Tarceva for over a decade. Recently, she has experienced multiple falls, which she attributes to a combination of blood pressure drops and dehydration. These falls have resulted in minor injuries, including a facial cut during the most recent incident. However, no fractures have been reported.  The patient has also been experiencing progressive weakness, which has been impacting her daily activities and energy levels. She has been unable to exercise due to this weakness, leading to further deterioration of her physical condition.  In addition to her lung cancer, the patient has been diagnosed with a bladder mass. A cystoscopy has been performed, and the patient is scheduled for a procedure to remove the mass.  The patient has also had a recent history  of a small pulmonary embolism, for which she has been on Eliquis. However, a recent scan did not show the embolism. The patient's hemoglobin levels have been stable, but slightly low, and she has been advised to take iron supplements. Her protein levels have also been low, indicating a need for increased nutritional intake.       MEDICAL HISTORY: Past Medical History:  Diagnosis Date   Anemia    Arthritis    HANDS,  WRISTS   CAP (community acquired pneumonia) 12/25/2022   CHF (congestive heart failure) (HCC)    COPD GOLD 0/ lama/laba responsive 07/11/2019   Quit smoking 1979  07/05/2016  Walked RA x 3 laps @ 185 ft each stopped due to  End of study, nl pace no desat or sob   - Spirometry 07/05/2016  wnl p am spiriva   - CTa chest 07/05/2016 neg pe/ neg ILD, R infrahilar density c/w lung ca vs post RT scar    Chest CT w contrast  09/12/18  Stable CT of the chest  - 11/25/2018   Walked RA  2 laps @ 286ft each @ moderate pace  stopped due to  desats to 86% c   Depression 04/04/2017   Dyspnea    Dyspnea on exertion    Encounter for therapeutic drug monitoring 10/01/2016   GERD (gastroesophageal reflux disease)    at times   Heart failure with preserved left ventricular function (HFpEF) (HCC) 11/24/2023   Hemorrhoid    History of anal fissures    History of kidney stones    History of lung cancer ONCOLOGIST--  DR Arbutus Ped--  LAST CT ,  NO RECURRENCE OR METS   DX JAN 2010 --  STAGE IIIA  NON-SMALL CELL ADENOCARCINOMA (RIGHT MIDDLE LOBE)---  S/P  CHEMORADIATIO THERAPY  (COMPLETE 03-21-2009)   History of pulmonary embolism    HTN (hypertension) 10/10/2017   Imbalance 06/04/2017   lung ca dx'd 2010   Normal cardiac stress test    2007  PER PT   Osteoporosis    PONV (postoperative nausea and vomiting)    Rash, skin    RIGHT FOREARM/ HAND   Right wrist pain    MASS   Wears glasses     ALLERGIES:  is allergic to methotrexate, clindamycin hcl, lactose intolerance (gi), amoxicillin, and protonix  [pantoprazole sodium].  MEDICATIONS:  Current Outpatient Medications  Medication Sig Dispense Refill   acetaminophen (TYLENOL) 650 MG CR tablet Take 650 mg by mouth in the morning.     AMBULATORY NON FORMULARY MEDICATION R knee brace Dispense 1 Dx code: M17.11 Use as needed 1 Device 0   apixaban (ELIQUIS) 5 MG TABS tablet Take 2 tablets (10 mg total) by mouth 2 (two) times daily for 5 days, THEN 1 tablet (5 mg total) 2 (two) times daily for 25 days. (Patient taking differently: Take 1 tablet (5 mg total) 2 (two) times daily.) 70 tablet 0   b complex vitamins capsule Take 1 capsule by mouth in the morning.     bismuth subsalicylate (PEPTO BISMOL) 262 MG chewable tablet Chew 262 mg by mouth as needed for indigestion or diarrhea or loose stools.     Calcium Carbonate-Vit D-Min (CALCIUM 600+D PLUS MINERALS) 600-400 MG-UNIT TABS Take 1 tablet by mouth 2 (two) times daily.     Cholecalciferol (VITAMIN D) 50 MCG (2000 UT) tablet Take 2,000 Units by mouth in the morning.     desonide (DESOWEN) 0.05 % ointment Apply 1 Application topically 2 (two) times daily as needed (scaly skin).     erlotinib (TARCEVA) 100 MG tablet Take 1 tablet (100 mg total) by mouth daily. (Patient taking differently: Take 100 mg by mouth daily at 4 PM.) 30 tablet 3   hydrocortisone 2.5 % cream Apply 1 application  topically every other day.     lactase (LACTAID) 3000 units tablet Take 3,000 Units by mouth daily as needed (when eating dairy products).     loperamide (IMODIUM A-D) 2 MG tablet Take 2 mg by mouth as needed for diarrhea or loose stools.     loratadine (CLARITIN) 10 MG tablet Take 10 mg by mouth daily as needed for allergies.     mirtazapine (REMERON) 15 MG tablet TAKE 1 TABLET BY MOUTH AT BEDTIME 30 tablet 0   Multiple Vitamins-Minerals (HAIR SKIN AND NAILS FORMULA) TABS Take 1 tablet by mouth daily.     Multiple Vitamins-Minerals (PRESERVISION AREDS 2 PO) Take 1 tablet by mouth 2 (two) times daily.     Polyethyl  Glycol-Propyl Glycol (SYSTANE OP) Place 1 drop into both eyes in the morning and at bedtime.     Probiotic Product (PROBIOTIC ACIDOPHILUS) CHEW Chew 1 tablet by mouth in the morning.     tacrolimus (PROGRAF) 1 MG capsule 1 mg See admin instructions. Dissolve 1 mg capsule in 1/2 liter of water soaking a cotton ball and apply to wound twice daily     Tiotropium Bromide-Olodaterol (STIOLTO RESPIMAT) 2.5-2.5 MCG/ACT AERS INHALE 2 PUFFS BY MOUTH ONCE DAILY 4 g 0   No current facility-administered medications for this visit.    SURGICAL HISTORY:  Past Surgical History:  Procedure Laterality Date   ANAL FISSURE REPAIR  07/09/2011  INTERNAL SPHINCTEROTOMY   BENIGN EXCISION LEFT BREAST CENTRAL DUCT  02/1999   BREAST BIOPSY  01/31/2009   benign   BREAST EXCISIONAL BIOPSY Left 2004   benign   CHEST TUBE INSERTION Right 07/14/2014   Procedure: INSERTION PLEURAL DRAINAGE CATHETER RIGHT CHEST;  Surgeon: Delight Ovens, MD;  Location: [facility] OR;  Service: Thoracic;  Laterality: Right;   DIRECT LARYNGOSCOPY WITH RADIAESSE INJECTION Bilateral 08/05/2023   Procedure: SUSPENDED MIRCO DIRECT LARYNGOSCOPY WITH PROLARYN INJECTION;  Surgeon: Christia Reading, MD;  Location: [facility] OR;  Service: ENT;  Laterality: Bilateral;   EXCISION RIGHT WRIST MASS  2012   EXTRACORPOREAL SHOCK WAVE LITHOTRIPSY Left 06/01/2019   Procedure: EXTRACORPOREAL SHOCK WAVE LITHOTRIPSY (ESWL);  Surgeon: Malen Gauze, MD;  Location: [facility] ORS;  Service: Urology;  Laterality: Left;   EYE SURGERY Bilateral    congenital "spot removed and also cataract removed   IR ANGIOGRAM EXTREMITY RIGHT  07/09/2023   IR ANGIOGRAM SELECTIVE EACH ADDITIONAL VESSEL  07/09/2023   IR RADIOLOGIST EVAL & MGMT  05/28/2023   IR US GUIDE VASC ACCESS RIGHT  07/09/2023   KNEE ARTHROSCOPY Right 1999   MASS EXCISION Right 03/11/2014   Procedure: RIGHT WRIST DEEP MASS EXCISION WITH CULTURE AND BIOSPY;  Surgeon: Sharma Covert, MD;  Location: [facility];  Service: Orthopedics;  Laterality: Right;   MICROLARYNGOSCOPY W/VOCAL CORD INJECTION Bilateral 02/08/2021   Procedure: MICROLARYNGOSCOPY WITH VOCAL CORD INJECTION;  Surgeon: Christia Reading, MD;  Location: [facility] OR;  Service: ENT;  Laterality: Bilateral;   REMOVAL OF PLEURAL DRAINAGE CATHETER Right 10/14/2014   Procedure: REMOVAL OF PLEURAL DRAINAGE CATHETER;  Surgeon: Delight Ovens, MD;  Location: [facility] OR;  Service: Thoracic;  Laterality: Right;   TALC PLEURODESIS Right 09/16/2014   Procedure: TALC PLEURADESIS/ slurry;  Surgeon: Delight Ovens, MD;  Location: [facility] OR;  Service: Thoracic;  Laterality: Right;   THORACOSCOPY  01/26/2009   w/   lung/  node biopsy's   THUMB ARTHROSCOPY Left    - removed bone spur   TONSILLECTOMY  AS CHILD   TOTAL ABDOMINAL HYSTERECTOMY W/ BILATERAL SALPINGOOPHORECTOMY  1982   W/  APPENDECTOMY   TRANSTHORACIC ECHOCARDIOGRAM  12/23/2008   NORMAL LV/  EF 65-70%/  MILD MR  &  TR   VAULT SUSPENSION PLUS CYSTOCELE REPAIR WITH GRAFT  06/13/2010    REVIEW OF SYSTEMS:  Constitutional: positive for fatigue Eyes: negative Ears, nose, mouth, throat, and face: negative Respiratory: positive for dyspnea on exertion Cardiovascular: negative Gastrointestinal: negative Genitourinary:positive for bladder mass Integument/breast: negative Hematologic/lymphatic: negative Musculoskeletal:negative Neurological: positive for weakness Behavioral/Psych: negative Endocrine: negative Allergic/Immunologic: negative   PHYSICAL EXAMINATION: General appearance: alert, cooperative, fatigued, and no distress Head: Normocephalic, without obvious abnormality, atraumatic Neck: no adenopathy, no JVD, supple, symmetrical, trachea midline, and thyroid not enlarged, symmetric, no tenderness/mass/nodules Lymph nodes: Cervical, supraclavicular, and axillary nodes normal. Resp: clear to auscultation bilaterally Back: symmetric, no curvature. ROM normal. No CVA tenderness. Cardio:  regular rate and rhythm, S1, S2 normal, no murmur, click, rub or gallop GI: soft, non-tender; bowel sounds normal; no masses,  no organomegaly Extremities: extremities normal, atraumatic, no cyanosis or edema Neurologic: Alert and oriented X 3, normal strength and tone. Normal symmetric reflexes. Normal coordination and gait  ECOG PERFORMANCE STATUS: 1 - Symptomatic but completely ambulatory  Blood pressure (!) 131/96, pulse (!) 113, temperature 99.8 F (37.7 C), temperature source Temporal, resp. rate 16, height 5\' 2"  (1.575 m), weight 103 lb 12.8 oz (47.1 kg), SpO2  100%.  LABORATORY DATA: Lab Results  Component Value Date   WBC 7.5 01/20/2024   HGB 10.6 (L) 01/20/2024   HCT 33.2 (L) 01/20/2024   MCV 97.6 01/20/2024   PLT 437 (H) 01/20/2024      Chemistry      Component Value Date/Time   NA 142 01/20/2024 1437   NA 142 12/10/2017 1000   K 4.3 01/20/2024 1437   K 4.0 12/10/2017 1000   CL 105 01/20/2024 1437   CL 108 (H) 06/03/2013 0939   CO2 26 01/20/2024 1437   CO2 28 12/10/2017 1000   BUN 37 (H) 01/20/2024 1437   BUN 20.4 12/10/2017 1000   CREATININE 0.74 01/20/2024 1437   CREATININE 0.89 10/17/2023 1356   CREATININE 0.9 12/10/2017 1000      Component Value Date/Time   CALCIUM 9.4 01/20/2024 1437   CALCIUM 9.7 12/10/2017 1000   ALKPHOS 45 01/20/2024 1437   ALKPHOS 63 12/10/2017 1000   AST 33 01/20/2024 1437   AST 28 10/17/2023 1356   AST 25 12/10/2017 1000   ALT 33 01/20/2024 1437   ALT 20 10/17/2023 1356   ALT 17 12/10/2017 1000   BILITOT 0.6 01/20/2024 1437   BILITOT 0.7 10/17/2023 1356   BILITOT 1.05 12/10/2017 1000       RADIOGRAPHIC STUDIES: No results found.  ASSESSMENT AND PLAN:  This is a very pleasant 89 years old white female with stage IIIB non-small cell lung cancer, adenocarcinoma with positive EGFR mutation diagnosed in January 2010. She has been on treatment with Tarceva for more than 9 years.   She is currently on Tarceva 100 mg p.o.  daily status post 130  months. The patient has been tolerating her treatment with Tarceva fairly well except for intermittent diarrhea and occasional skin rash. The patient had frequent falls recently secondary to generalized weakness and fatigue.  She was also diagnosed with bladder mass consistent with urothelial carcinoma and expected to have surgical excision followed by gemcitabine intravesical treatment. For the insomnia and depression, she is currently on Remeron but her dose was reduced to 7.5 mg p.o. daily.     Non-Small Cell Lung Cancer (NSCLC) Adenocarcinoma Diagnosed with stage III B NSCLC adenocarcinoma in January 2010. Currently on Tarceva for over ten years with good response. No new symptoms or complications noted. Discussed the benefits of continuing Tarceva. - Continue Tarceva - Schedule follow-up in three months with routine blood work - Perform a scan at the subsequent visit  Bladder Mass Bladder mass approximately one inch in size. Cystoscopy performed, and gemcitabine infusion planned. Awaiting further intervention by urologist Dr. Darrol Angel. Discussed risks and benefits of gemcitabine infusion, including potential side effects and goal of reducing the mass. - Proceed with gemcitabine infusion as planned by urologist - Monitor for complications or symptoms related to the bladder mass  Pulmonary Embolism Small pulmonary embolism detected in December. Currently on Eliquis. Recent scan did not show the embolism. Plan to continue anticoagulation for three months due to cancer-related clotting risk. Discussed risks of discontinuing Eliquis prematurely and need to hold it temporarily for the bladder procedure to minimize bleeding risk. - Continue Eliquis for a total of three months - Hold Eliquis for a day or two before the bladder procedure to minimize bleeding risk - Resume Eliquis post-procedure  Recurrent Falls Recent falls likely due to hypotension and dehydration.  Sustained a facial laceration but no fractures. Reports of generalized weakness and decreased energy. Discussed importance of hydration and monitoring blood  pressure to prevent future falls. - Advise use of a wheelchair or assistance during grocery shopping - Encourage hydration and monitor blood pressure - Consider iron supplementation to address potential iron deficiency  General Health Maintenance Hemoglobin level at 10.6, slightly low but stable. Protein levels improving but still low. Generalized weakness and difficulty with physical activity noted. Discussed importance of increased protein intake and gradual physical activity to improve overall health. - Encourage increased protein intake - Consider iron supplementation to improve hemoglobin levels - Advise gradual increase in physical activity as tolerated  Follow-up - Schedule follow-up appointment in three months with routine blood work - Plan for a scan at the subsequent visit.   The patient was advised to call immediately if she has any other concerning symptoms in the interval.  The patient voices understanding of current disease status and treatment options and is in agreement with the current care plan. All questions were answered. The patient knows to call the clinic with any problems, questions or concerns. We can certainly see the patient much sooner if necessary. The total time spent in the appointment was 30 minutes.  Disclaimer: This note was dictated with voice recognition software. Similar sounding words can inadvertently be transcribed and may not be corrected upon review.

## 2024-01-21 NOTE — H&P (Signed)
12 F referred for enhancing mass on posteroir R bladder wall lesions on CT. Lesions discovered after patient had fall and presented to ER for CT.   Lung cancer: currently on Tarceva for NSCLC stage IIIB, has been on since 2014.   PMH: Lung cancer, HTN, COPD, GERD, R knee pain.   Mass was discovered after thickness of the bladder was noted, she denies any gross hematuria. She denies any history of professional exposure to solvents. She was a longtime smoker quit 23 years ago.   Patient has a strong family history of bladder cancer with father needing cystectomy.   Patient denies seeing cardiologist in the recent past. She denies any cardiac comorbidities.   Pulm history includes lung cancer as well as questionable COPD she is seen by her PCP for this.  Patient denies blood thinners   Bladder mass:  12/26/22: had PE and 2/2 to lung cacner DC 12/4. On Eliquis. Will do cyto for eval of bladder mass. Cysto today shows tumor posterior to the right UO. size is 3-4 cm. On Elquis for PE managed by PCP. PCP Selena Batten, Oncologist [facility] Elwin Sleight Cardiologist.   Clearance sent to urology on 01/06/24 for PE.     ALLERGIES: Amoxicillin Cephalexin - Possible diarrhea Pantoprazole    MEDICATIONS: Biotin  Bismuth Subsalicylate 262 mg/15 ml suspension, oral  Calcium + D TABS Oral  Cholecalciferol  Lactaid  Loratadine 10 mg tablet  Metamucil  Miralax  Mirtazapine 15 mg tablet  Multivitamin  Probiotic  Simethicone  Stiolto Respimat 2.5 mcg-2.5 mcg/actuation mist inhaler  Tacrolimus 1 mg capsule  Tarceva 100 mg tablet  Vitamin B Complex  Vitamin B12     GU PSH: Bladder Repair - 2011 ESWL, Left - 2020 Hysterectomy Unilat SO - 2010 Insert Mesh/pelvic Flr Addon - 2011 Repair Vaginal Prolapse - 2011       PSH Notes: Anterior Colporrhaphy, Repair Of Cystocele, Sacrospinous Ligament Fixation For Posthysterectomy Prolapse, Vaginal Surg Insertion Of Mesh For Pelvic Floor  Repair, Breast Surgery, Hysterectomy, Knee Surgery   NON-GU PSH: Breast Surgery Procedure - 2010 Knee Arthroscopy, Right Wrist Arthroscopy, Right, x's 2     GU PMH: Bladder tumor/neoplasm - 10/30/2023 Ureteral calculus - 2020 Renal and ureteral calculus (Acute), Left - 2020 Cystocele, Unspec, Vaginal wall prolapse - 2014 Incontinence w/o Sensation, Urinary incontinence without sensory awareness - 2014 Nocturia, Nocturia - 2014 Urinary Frequency, Increased urinary frequency - 2014 Urinary Tract Inf, Unspec site, Urinary tract infection - 2014      PMH Notes:  1898-12-24 00:00:00 - Note: Normal Routine History And Physical Senior Citizen (65-80)  2009-06-01 11:14:03 - Note: Chronic Reflux Esophagitis  2009-06-01 11:14:03 - Note: Arthritis   NON-GU PMH: Personal history of other endocrine, nutritional and metabolic disease, History of hypercholesterolemia - 2014 Arthritis Lung Cancer, History    FAMILY HISTORY: 3 Son's - Son Bladder Cancer - Father Death In The Family Father - Father Death In The Family Mother - Mother   SOCIAL HISTORY: Marital Status: Married Preferred Language: English; Ethnicity: Not Hispanic Or Latino; Race: White Current Smoking Status: Patient does not smoke anymore. Has not smoked since 05/24/1989. Smoked for 20 years. Smoked 1/2 pack per day.   Tobacco Use Assessment Completed: Used Tobacco in last 30 days? Has never drank.  Drinks 1 caffeinated drink per day. Patient's occupation is/was retired.    REVIEW OF SYSTEMS:    GU Review Female:   Patient denies get up at night to urinate, being pregnant, stream  starts and stops, frequent urination, leakage of urine, trouble starting your stream, burning /pain with urination, have to strain to urinate, and hard to postpone urination.  Gastrointestinal (Upper):   Patient denies nausea, vomiting, and indigestion/ heartburn.  Gastrointestinal (Lower):   Patient denies diarrhea and constipation.  Constitutional:    Patient denies fever, night sweats, weight loss, and fatigue.  Skin:   Patient denies skin rash/ lesion and itching.  Eyes:   Patient denies blurred vision and double vision.  Ears/ Nose/ Throat:   Patient denies sore throat and sinus problems.  Hematologic/Lymphatic:   Patient denies swollen glands and easy bruising.  Cardiovascular:   Patient denies leg swelling and chest pains.  Respiratory:   Patient denies cough and shortness of breath.  Endocrine:   Patient denies excessive thirst.  Musculoskeletal:   Patient denies back pain and joint pain.  Neurological:   Patient denies headaches and dizziness.  Psychologic:   Patient denies depression and anxiety.   VITAL SIGNS: None   Complexity of Data:  Records Review:   Previous Doctor Records, Previous Patient Records   PROCEDURES:         Flexible Cystoscopy - 52000  Risks, benefits, and some of the potential complications of the procedure were discussed at length with the patient including infection, bleeding, voiding discomfort, urinary retention, fever, chills, sepsis, and others. All questions were answered. Informed consent was obtained. Antibiotic prophylaxis was given. Sterile technique and intraurethral analgesia were used.  Meatus:  Normal size. Normal location. Normal condition.  Urethra:  No hypermobility. No leakage.  Ureteral Orifices:  Normal location. Normal size. Normal shape. Effluxed clear urine.  Bladder:  Papillary mass consistent with TCC posterior to the right ureteral orifice extending of the lateral wall along the course of the right ureter path. No other masses noted within the bladder. Tumor size about 3 to 4 cm.      The lower urinary tract was carefully examined. The procedure was well-tolerated and without complications. Antibiotic instructions were given. Instructions were given to call the office immediately for bloody urine, difficulty urinating, urinary retention, painful or frequent urination, fever,  chills, nausea, vomiting or other illness. The patient stated that she understood these instructions and would comply with them.         Visit Complexity - G2211          Urinalysis w/Scope Dipstick Dipstick Cont'd Micro  Color: Yellow Bilirubin: Neg mg/dL WBC/hpf: 0 - 5/hpf  Appearance: Clear Ketones: Neg mg/dL RBC/hpf: 3 - 08/MVH  Specific Gravity: 1.025 Blood: 1+ ery/uL Bacteria: Few (10-25/hpf)  pH: 5.5 Protein: 2+ mg/dL Cystals: Amorph Urates  Glucose: Neg mg/dL Urobilinogen: 0.2 mg/dL Casts: NS (Not Seen)    Nitrites: Neg Trichomonas: Not Present    Leukocyte Esterase: Neg leu/uL Mucous: Present      Epithelial Cells: 0 - 5/hpf      Yeast: Few (1 - 5/hpf)      Sperm: Not Present    Notes: talc crystals seen.    ASSESSMENT:      ICD-10 Details  1 GU:   Bladder tumor/neoplasm - D41.4 Chronic, Stable     PLAN:           Document Letter(s):  Created for Patient: Clinical Summary         Notes:   Bladder tumor: Initially scheduled for TURBT with gemcitabine instillation patient had PE week prior to procedure. Cystoscopy did not today demonstrates a 3 to 4 cm tumor consistent with  the CT findings. Patient We again discussed risk benefits alternatives to TURBT.   Here today for TURBT  PT cleared to stop eliquis by Dr. Uvaldo Rising. Will plan to hold Eliquis until tomorrow.   We discussed risk benefits alternatives to transurethral resection of bladder tumor. Benefits are diagnostic and therapeutic which include removal of bladder tumor and relieve the possible irritative symptoms from bladder tumor. We discussed risk including not being able to remove all of the tumor, possibility of perforation of the bladder requiring long-term catheter or operative intervention to repair. The need for catheter postoperatively was also discussed, as well as postoperative lower urinary tract symptoms in the immediate postop period. Patient voiced their understanding and would like to proceed with  the surgery.

## 2024-01-21 NOTE — Telephone Encounter (Signed)
Holding until needed by patient.

## 2024-01-21 NOTE — Anesthesia Preprocedure Evaluation (Signed)
Anesthesia Evaluation  Patient identified by MRN, date of birth, ID band Patient awake    Reviewed: Allergy & Precautions, NPO status , Patient's Chart, lab work & pertinent test results  History of Anesthesia Complications (+) PONV and history of anesthetic complications  Airway Mallampati: II  TM Distance: >3 FB Neck ROM: Full    Dental no notable dental hx.    Pulmonary shortness of breath and with exertion, COPD,  COPD inhaler, former smoker, PE   Pulmonary exam normal breath sounds clear to auscultation       Cardiovascular hypertension, +CHF and + DOE  Normal cardiovascular exam Rhythm:Regular Rate:Normal     Neuro/Psych  PSYCHIATRIC DISORDERS  Depression    negative neurological ROS     GI/Hepatic negative GI ROS, Neg liver ROS,,,  Endo/Other  negative endocrine ROS    Renal/GU Renal disease     Musculoskeletal  (+) Arthritis ,    Abdominal   Peds  Hematology  (+) Blood dyscrasia (Eliquis), anemia   Anesthesia Other Findings BLADDER TUMOR  Reproductive/Obstetrics                             Anesthesia Physical Anesthesia Plan  ASA: 3  Anesthesia Plan: General   Post-op Pain Management:    Induction: Intravenous  PONV Risk Score and Plan: 4 or greater and Ondansetron, Dexamethasone, Propofol infusion and Treatment may vary due to age or medical condition  Airway Management Planned: Oral ETT  Additional Equipment:   Intra-op Plan:   Post-operative Plan: Extubation in OR  Informed Consent: I have reviewed the patients History and Physical, chart, labs and discussed the procedure including the risks, benefits and alternatives for the proposed anesthesia with the patient or authorized representative who has indicated his/her understanding and acceptance.       Plan Discussed with: CRNA and Surgeon  Anesthesia Plan Comments: (PAT note 01/20/2024)        Anesthesia Quick Evaluation

## 2024-01-21 NOTE — Progress Notes (Signed)
Anesthesia Chart Review   Case: [id number] Date/Time: 01/23/24 1015   Procedure: TRANSURETHRAL RESECTION OF BLADDER TUMOR (TURBT) with GEMCITABINE, POSSIBLE RETROGRADE PYELOGRAM - 45 MINUTE CASE   Anesthesia type: General   Pre-op diagnosis: BLADDER TUMOR   Location: [facility]   Surgeons: Adonis Brook, MD       DISCUSSION:89 y.o. former smoker with h/o PONV, HTN, COPD, lung cancer s/p chemoradiation 2010 (currently on Tarceva), PE, bladder tumor scheduled for above procedure 01/23/2024 with Dr. Vilma Prader.   Pt last seen by oncology 01/20/2024. Stable at this visit. Per OV note, "Small pulmonary embolism detected in December. Currently on Eliquis. Recent scan did not show the embolism. Plan to continue anticoagulation for three months due to cancer-related clotting risk. Discussed risks of discontinuing Eliquis prematurely and need to hold it temporarily for the bladder procedure to minimize bleeding risk. - Continue Eliquis for a total of three months - Hold Eliquis for a day or two before the bladder procedure to minimize bleeding risk - Resume Eliquis post-procedure"  VS: BP (!) 116/58 Comment: left arm sitting  Pulse (!) 106   Temp 36.8 C (Oral)   Resp 14   Ht 5\' 2"  (1.575 m)   Wt 45.4 kg   SpO2 92%   BMI 18.29 kg/m   PROVIDERS: Gweneth Dimitri, MD is PCP   Cardiologist - Charlton Haws, MD  LABS: Labs reviewed: Acceptable for surgery. (all labs ordered are listed, but only abnormal results are displayed)  Labs Reviewed  COMPREHENSIVE METABOLIC PANEL - Abnormal; Notable for the following components:      Result Value   Glucose, Bld 108 (*)    BUN 37 (*)    Total Protein 6.2 (*)    Albumin 3.3 (*)    All other components within normal limits  CBC - Abnormal; Notable for the following components:   RBC 3.40 (*)    Hemoglobin 10.6 (*)    HCT 33.2 (*)    Platelets 437 (*)    All other components within normal limits  URINE CULTURE      IMAGES:   EKG:   CV: Echo 11/25/2023 1. Left ventricular ejection fraction, by estimation, is >75%. The left  ventricle has hyperdynamic function. The left ventricle has no regional  wall motion abnormalities. Left ventricular diastolic parameters are  consistent with Grade I diastolic  dysfunction (impaired relaxation).   2. Right ventricular systolic function is normal. The right ventricular  size is mildly enlarged. There is normal pulmonary artery systolic  pressure. The estimated right ventricular systolic pressure is 23.5 mmHg.   3. The mitral valve is abnormal. Trivial mitral valve regurgitation.   4. The aortic valve was not well visualized. Aortic valve regurgitation  is trivial. Aortic valve sclerosis/calcification is present, without any  evidence of aortic stenosis.   5. The inferior vena cava is normal in size with <50% respiratory  variability, suggesting right atrial pressure of 8 mmHg.   Comparison(s): Changes from prior study are noted. 07/05/2020: LVEF 60-65%.   Myocardial Perfusion 07/05/2020 The left ventricular ejection fraction is hyperdynamic (>65%). Nuclear stress EF: 73%. There was no ST segment deviation noted during stress. The study is normal. This is a low risk study.   Normal stress nuclear study with no ischemia or infarction.  Gated ejection fraction 73% with normal wall motion. Past Medical History:  Diagnosis Date   Anemia    Arthritis    HANDS,  WRISTS   CAP (  community acquired pneumonia) 12/25/2022   CHF (congestive heart failure) (HCC)    COPD GOLD 0/ lama/laba responsive 07/11/2019   Quit smoking 1979  07/05/2016  Walked RA x 3 laps @ 185 ft each stopped due to  End of study, nl pace no desat or sob   - Spirometry 07/05/2016  wnl p am spiriva   - CTa chest 07/05/2016 neg pe/ neg ILD, R infrahilar density c/w lung ca vs post RT scar    Chest CT w contrast  09/12/18  Stable CT of the chest  - 11/25/2018   Walked RA  2 laps @ 240ft each @  moderate pace  stopped due to  desats to 86% c   Depression 04/04/2017   Dyspnea    Dyspnea on exertion    Encounter for therapeutic drug monitoring 10/01/2016   GERD (gastroesophageal reflux disease)    at times   Heart failure with preserved left ventricular function (HFpEF) (HCC) 11/24/2023   Hemorrhoid    History of anal fissures    History of kidney stones    History of lung cancer ONCOLOGIST--  DR Arbutus Ped--  LAST CT ,  NO RECURRENCE OR METS   DX JAN 2010 --  STAGE IIIA  NON-SMALL CELL ADENOCARCINOMA (RIGHT MIDDLE LOBE)---  S/P  CHEMORADIATIO THERAPY  (COMPLETE 03-21-2009)   History of pulmonary embolism    HTN (hypertension) 10/10/2017   Imbalance 06/04/2017   lung ca dx'd 2010   Normal cardiac stress test    2007  PER PT   Osteoporosis    PONV (postoperative nausea and vomiting)    Rash, skin    RIGHT FOREARM/ HAND   Right wrist pain    MASS   Wears glasses     Past Surgical History:  Procedure Laterality Date   ANAL FISSURE REPAIR  07/09/2011   INTERNAL SPHINCTEROTOMY   BENIGN EXCISION LEFT BREAST CENTRAL DUCT  02/1999   BREAST BIOPSY  01/31/2009   benign   BREAST EXCISIONAL BIOPSY Left 2004   benign   CHEST TUBE INSERTION Right 07/14/2014   Procedure: INSERTION PLEURAL DRAINAGE CATHETER RIGHT CHEST;  Surgeon: Delight Ovens, MD;  Location: [facility];  Service: Thoracic;  Laterality: Right;   DIRECT LARYNGOSCOPY WITH RADIAESSE INJECTION Bilateral 08/05/2023   Procedure: SUSPENDED MIRCO DIRECT LARYNGOSCOPY WITH PROLARYN INJECTION;  Surgeon: Christia Reading, MD;  Location: [facility] OR;  Service: ENT;  Laterality: Bilateral;   EXCISION RIGHT WRIST MASS  2012   EXTRACORPOREAL SHOCK WAVE LITHOTRIPSY Left 06/01/2019   Procedure: EXTRACORPOREAL SHOCK WAVE LITHOTRIPSY (ESWL);  Surgeon: Malen Gauze, MD;  Location: [facility];  Service: Urology;  Laterality: Left;   EYE SURGERY Bilateral    congenital "spot removed and also cataract removed   IR ANGIOGRAM EXTREMITY RIGHT   07/09/2023   IR ANGIOGRAM SELECTIVE EACH ADDITIONAL VESSEL  07/09/2023   IR RADIOLOGIST EVAL & MGMT  05/28/2023   IR US GUIDE VASC ACCESS RIGHT  07/09/2023   KNEE ARTHROSCOPY Right 1999   MASS EXCISION Right 03/11/2014   Procedure: RIGHT WRIST DEEP MASS EXCISION WITH CULTURE AND BIOSPY;  Surgeon: Sharma Covert, MD;  Location: [facility];  Service: Orthopedics;  Laterality: Right;   MICROLARYNGOSCOPY W/VOCAL CORD INJECTION Bilateral 02/08/2021   Procedure: MICROLARYNGOSCOPY WITH VOCAL CORD INJECTION;  Surgeon: Christia Reading, MD;  Location: [facility] OR;  Service: ENT;  Laterality: Bilateral;   REMOVAL OF PLEURAL DRAINAGE CATHETER Right 10/14/2014   Procedure: REMOVAL OF PLEURAL DRAINAGE CATHETER;  Surgeon: Ramon Dredge  Bari Edward, MD;  Location: [facility];  Service: Thoracic;  Laterality: Right;   TALC PLEURODESIS Right 09/16/2014   Procedure: TALC PLEURADESIS/ slurry;  Surgeon: Delight Ovens, MD;  Location: [facility] OR;  Service: Thoracic;  Laterality: Right;   THORACOSCOPY  01/26/2009   w/   lung/  node biopsy's   THUMB ARTHROSCOPY Left    - removed bone spur   TONSILLECTOMY  AS CHILD   TOTAL ABDOMINAL HYSTERECTOMY W/ BILATERAL SALPINGOOPHORECTOMY  1982   W/  APPENDECTOMY   TRANSTHORACIC ECHOCARDIOGRAM  12/23/2008   NORMAL LV/  EF 65-70%/  MILD MR  &  TR   VAULT SUSPENSION PLUS CYSTOCELE REPAIR WITH GRAFT  06/13/2010    MEDICATIONS:  acetaminophen (TYLENOL) 650 MG CR tablet   AMBULATORY NON FORMULARY MEDICATION   apixaban (ELIQUIS) 5 MG TABS tablet   b complex vitamins capsule   bismuth subsalicylate (PEPTO BISMOL) 262 MG chewable tablet   Calcium Carbonate-Vit D-Min (CALCIUM 600+D PLUS MINERALS) 600-400 MG-UNIT TABS   Cholecalciferol (VITAMIN D) 50 MCG (2000 UT) tablet   desonide (DESOWEN) 0.05 % ointment   erlotinib (TARCEVA) 100 MG tablet   hydrocortisone 2.5 % cream   lactase (LACTAID) 3000 units tablet   loperamide (IMODIUM A-D) 2 MG tablet   loratadine (CLARITIN) 10 MG  tablet   mirtazapine (REMERON) 15 MG tablet   Multiple Vitamins-Minerals (HAIR SKIN AND NAILS FORMULA) TABS   Multiple Vitamins-Minerals (PRESERVISION AREDS 2 PO)   Polyethyl Glycol-Propyl Glycol (SYSTANE OP)   Probiotic Product (PROBIOTIC ACIDOPHILUS) CHEW   tacrolimus (PROGRAF) 1 MG capsule   Tiotropium Bromide-Olodaterol (STIOLTO RESPIMAT) 2.5-2.5 MCG/ACT AERS   No current facility-administered medications for this encounter.    Jodell Cipro Ward, PA-C [facility] [phone]

## 2024-01-22 DIAGNOSIS — M810 Age-related osteoporosis without current pathological fracture: Secondary | ICD-10-CM | POA: Diagnosis not present

## 2024-01-22 DIAGNOSIS — M1990 Unspecified osteoarthritis, unspecified site: Secondary | ICD-10-CM | POA: Diagnosis not present

## 2024-01-22 DIAGNOSIS — Z87891 Personal history of nicotine dependence: Secondary | ICD-10-CM | POA: Diagnosis not present

## 2024-01-22 DIAGNOSIS — Z556 Problems related to health literacy: Secondary | ICD-10-CM | POA: Diagnosis not present

## 2024-01-22 DIAGNOSIS — R636 Underweight: Secondary | ICD-10-CM | POA: Diagnosis not present

## 2024-01-22 DIAGNOSIS — Z7901 Long term (current) use of anticoagulants: Secondary | ICD-10-CM | POA: Diagnosis not present

## 2024-01-22 DIAGNOSIS — D649 Anemia, unspecified: Secondary | ICD-10-CM | POA: Diagnosis not present

## 2024-01-22 DIAGNOSIS — J9601 Acute respiratory failure with hypoxia: Secondary | ICD-10-CM | POA: Diagnosis not present

## 2024-01-22 DIAGNOSIS — I110 Hypertensive heart disease with heart failure: Secondary | ICD-10-CM | POA: Diagnosis not present

## 2024-01-22 DIAGNOSIS — E876 Hypokalemia: Secondary | ICD-10-CM | POA: Diagnosis not present

## 2024-01-22 DIAGNOSIS — M069 Rheumatoid arthritis, unspecified: Secondary | ICD-10-CM | POA: Diagnosis not present

## 2024-01-22 DIAGNOSIS — J440 Chronic obstructive pulmonary disease with acute lower respiratory infection: Secondary | ICD-10-CM | POA: Diagnosis not present

## 2024-01-22 DIAGNOSIS — I2510 Atherosclerotic heart disease of native coronary artery without angina pectoris: Secondary | ICD-10-CM | POA: Diagnosis not present

## 2024-01-22 DIAGNOSIS — I5032 Chronic diastolic (congestive) heart failure: Secondary | ICD-10-CM | POA: Diagnosis not present

## 2024-01-22 DIAGNOSIS — J8410 Pulmonary fibrosis, unspecified: Secondary | ICD-10-CM | POA: Diagnosis not present

## 2024-01-22 DIAGNOSIS — K219 Gastro-esophageal reflux disease without esophagitis: Secondary | ICD-10-CM | POA: Diagnosis not present

## 2024-01-22 LAB — URINE CULTURE: Culture: 20000 — AB

## 2024-01-23 ENCOUNTER — Ambulatory Visit (HOSPITAL_COMMUNITY): Payer: Medicare Other | Admitting: Physician Assistant

## 2024-01-23 ENCOUNTER — Ambulatory Visit (HOSPITAL_COMMUNITY)
Admission: RE | Admit: 2024-01-23 | Discharge: 2024-01-23 | Disposition: A | Discharge status: Home / Self Care | Payer: Medicare Other | Attending: Urology | Admitting: Urology

## 2024-01-23 ENCOUNTER — Other Ambulatory Visit: Payer: Self-pay

## 2024-01-23 ENCOUNTER — Encounter (HOSPITAL_COMMUNITY)
Admission: RE | Disposition: A | Discharge status: Home / Self Care | Payer: Self-pay | Source: Home / Self Care | Attending: Urology

## 2024-01-23 ENCOUNTER — Inpatient Hospital Stay: Payer: Medicare Other

## 2024-01-23 ENCOUNTER — Inpatient Hospital Stay: Payer: Medicare Other | Attending: Internal Medicine | Admitting: Internal Medicine

## 2024-01-23 ENCOUNTER — Encounter (HOSPITAL_COMMUNITY): Payer: Self-pay | Admitting: Urology

## 2024-01-23 ENCOUNTER — Ambulatory Visit (HOSPITAL_BASED_OUTPATIENT_CLINIC_OR_DEPARTMENT_OTHER): Payer: Medicare Other | Admitting: Anesthesiology

## 2024-01-23 DIAGNOSIS — I110 Hypertensive heart disease with heart failure: Secondary | ICD-10-CM | POA: Insufficient documentation

## 2024-01-23 DIAGNOSIS — I5030 Unspecified diastolic (congestive) heart failure: Secondary | ICD-10-CM | POA: Diagnosis not present

## 2024-01-23 DIAGNOSIS — I509 Heart failure, unspecified: Secondary | ICD-10-CM

## 2024-01-23 DIAGNOSIS — Z7901 Long term (current) use of anticoagulants: Secondary | ICD-10-CM | POA: Insufficient documentation

## 2024-01-23 DIAGNOSIS — Z85118 Personal history of other malignant neoplasm of bronchus and lung: Secondary | ICD-10-CM | POA: Diagnosis not present

## 2024-01-23 DIAGNOSIS — K219 Gastro-esophageal reflux disease without esophagitis: Secondary | ICD-10-CM | POA: Insufficient documentation

## 2024-01-23 DIAGNOSIS — D090 Carcinoma in situ of bladder: Secondary | ICD-10-CM | POA: Diagnosis not present

## 2024-01-23 DIAGNOSIS — Z01818 Encounter for other preprocedural examination: Secondary | ICD-10-CM

## 2024-01-23 DIAGNOSIS — C679 Malignant neoplasm of bladder, unspecified: Secondary | ICD-10-CM | POA: Diagnosis not present

## 2024-01-23 DIAGNOSIS — Z8052 Family history of malignant neoplasm of bladder: Secondary | ICD-10-CM | POA: Insufficient documentation

## 2024-01-23 DIAGNOSIS — Z87891 Personal history of nicotine dependence: Secondary | ICD-10-CM | POA: Insufficient documentation

## 2024-01-23 DIAGNOSIS — J449 Chronic obstructive pulmonary disease, unspecified: Secondary | ICD-10-CM | POA: Insufficient documentation

## 2024-01-23 DIAGNOSIS — Z7951 Long term (current) use of inhaled steroids: Secondary | ICD-10-CM | POA: Insufficient documentation

## 2024-01-23 DIAGNOSIS — N329 Bladder disorder, unspecified: Secondary | ICD-10-CM | POA: Diagnosis present

## 2024-01-23 DIAGNOSIS — D494 Neoplasm of unspecified behavior of bladder: Secondary | ICD-10-CM | POA: Diagnosis not present

## 2024-01-23 SURGERY — TURBT, WITH CHEMOTHERAPEUTIC AGENT INSTILLATION INTO BLADDER
Anesthesia: General

## 2024-01-23 MED ORDER — ONDANSETRON HCL 4 MG/2ML IJ SOLN
INTRAMUSCULAR | Status: DC | PRN
  Administered 2024-01-23: 4 mg via INTRAVENOUS

## 2024-01-23 MED ORDER — ONDANSETRON HCL 4 MG/2ML IJ SOLN
INTRAMUSCULAR | Status: AC
  Filled 2024-01-23: qty 2

## 2024-01-23 MED ORDER — FENTANYL CITRATE PF 50 MCG/ML IJ SOSY: 25.0000 ug | PREFILLED_SYRINGE | INTRAMUSCULAR | Status: DC | PRN

## 2024-01-23 MED ORDER — GEMCITABINE CHEMO FOR BLADDER INSTILLATION 2000 MG: 2000.0000 mg | Freq: Once | INTRAVENOUS | Status: DC

## 2024-01-23 MED ORDER — LIDOCAINE HCL (CARDIAC) PF 100 MG/5ML IV SOSY
PREFILLED_SYRINGE | INTRAVENOUS | Status: DC | PRN
  Administered 2024-01-23: 40 mg via INTRAVENOUS

## 2024-01-23 MED ORDER — AMISULPRIDE (ANTIEMETIC) 5 MG/2ML IV SOLN: 10.0000 mg | Freq: Once | INTRAVENOUS | Status: DC | PRN

## 2024-01-23 MED ORDER — ROCURONIUM BROMIDE 100 MG/10ML IV SOLN
INTRAVENOUS | Status: DC | PRN
  Administered 2024-01-23: 30 mg via INTRAVENOUS

## 2024-01-23 MED ORDER — ONDANSETRON HCL 4 MG/2ML IJ SOLN
4.0000 mg | Freq: Once | INTRAMUSCULAR | Status: AC | PRN
  Administered 2024-01-23: 4 mg via INTRAVENOUS

## 2024-01-23 MED ORDER — ACETAMINOPHEN 10 MG/ML IV SOLN: 750.0000 mg | Freq: Once | INTRAVENOUS | Status: DC | PRN

## 2024-01-23 MED ORDER — METHOCARBAMOL 750 MG PO TABS: 750.0000 mg | ORAL_TABLET | Freq: Four times a day (QID) | ORAL | 0 refills | Status: AC

## 2024-01-23 MED ORDER — FENTANYL CITRATE (PF) 100 MCG/2ML IJ SOLN
INTRAMUSCULAR | Status: AC
  Filled 2024-01-23: qty 2

## 2024-01-23 MED ORDER — FENTANYL CITRATE (PF) 100 MCG/2ML IJ SOLN
INTRAMUSCULAR | Status: DC | PRN
  Administered 2024-01-23: 100 ug via INTRAVENOUS

## 2024-01-23 MED ORDER — CHLORHEXIDINE GLUCONATE 0.12 % MT SOLN
15.0000 mL | Freq: Once | OROMUCOSAL | Status: AC
  Administered 2024-01-23: 15 mL via OROMUCOSAL

## 2024-01-23 MED ORDER — PHENAZOPYRIDINE HCL 200 MG PO TABS: 200.0000 mg | ORAL_TABLET | Freq: Three times a day (TID) | ORAL | 0 refills | Status: DC | PRN

## 2024-01-23 MED ORDER — PROPOFOL 10 MG/ML IV BOLUS
INTRAVENOUS | Status: DC | PRN
  Administered 2024-01-23: 100 mg via INTRAVENOUS

## 2024-01-23 MED ORDER — SODIUM CHLORIDE 0.9 % IR SOLN
Status: DC | PRN
  Administered 2024-01-23: 3000 mL

## 2024-01-23 MED ORDER — CIPROFLOXACIN IN D5W 400 MG/200ML IV SOLN
400.0000 mg | INTRAVENOUS | Status: AC
  Administered 2024-01-23: 400 mg via INTRAVENOUS
  Filled 2024-01-23: qty 200

## 2024-01-23 MED ORDER — ORAL CARE MOUTH RINSE: 15.0000 mL | Freq: Once | OROMUCOSAL | Status: AC

## 2024-01-23 MED ORDER — HYOSCYAMINE SULFATE 0.125 MG PO TBDP: 0.1250 mg | ORAL_TABLET | ORAL | 0 refills | Status: DC | PRN

## 2024-01-23 MED ORDER — LACTATED RINGERS IV SOLN: INTRAVENOUS | Status: DC

## 2024-01-23 MED ORDER — DEXAMETHASONE SODIUM PHOSPHATE 10 MG/ML IJ SOLN
INTRAMUSCULAR | Status: DC | PRN
  Administered 2024-01-23: 10 mg via INTRAVENOUS

## 2024-01-23 MED ORDER — STERILE WATER FOR IRRIGATION IR SOLN
Status: DC | PRN
Start: 2024-01-23 — End: 2024-01-23
  Administered 2024-01-23: 500 mL

## 2024-01-23 MED ORDER — PHENYLEPHRINE HCL (PRESSORS) 10 MG/ML IV SOLN
INTRAVENOUS | Status: DC | PRN
  Administered 2024-01-23: 80 ug via INTRAVENOUS
  Administered 2024-01-23: 260 ug via INTRAVENOUS
  Administered 2024-01-23: 80 ug via INTRAVENOUS
  Administered 2024-01-23: 160 ug via INTRAVENOUS

## 2024-01-23 MED ORDER — PROPOFOL 10 MG/ML IV BOLUS
INTRAVENOUS | Status: AC
  Filled 2024-01-23: qty 20

## 2024-01-23 SURGICAL SUPPLY — 17 items
BAG URINE DRAIN 2000ML AR STRL (UROLOGICAL SUPPLIES) IMPLANT
BAG URO CATCHER STRL LF (MISCELLANEOUS) ×2 IMPLANT
CATH FOLEY 2WAY SLVR 5CC 18FR (CATHETERS) IMPLANT
CATH FOLEY 2WAY SLVR 5CC 20FR (CATHETERS) IMPLANT
DRAPE FOOT SWITCH (DRAPES) ×2 IMPLANT
ELECT REM PT RETURN 15FT ADLT (MISCELLANEOUS) ×2 IMPLANT
GLOVE SURG LX STRL 8.0 MICRO (GLOVE) IMPLANT
GOWN STRL REUS W/ TWL XL LVL3 (GOWN DISPOSABLE) ×2 IMPLANT
KIT TURNOVER KIT A (KITS) IMPLANT
LOOP CUT BIPOLAR 24F LRG (ELECTROSURGICAL) IMPLANT
MANIFOLD NEPTUNE II (INSTRUMENTS) ×2 IMPLANT
PACK CYSTO (CUSTOM PROCEDURE TRAY) ×2 IMPLANT
PLUG CATH AND CAP STRL 200 (CATHETERS) IMPLANT
SYR TOOMEY IRRIG 70ML (MISCELLANEOUS)
SYRINGE TOOMEY IRRIG 70ML (MISCELLANEOUS) IMPLANT
TUBING CONNECTING 10 (TUBING) ×2 IMPLANT
TUBING UROLOGY SET (TUBING) ×2 IMPLANT

## 2024-01-23 NOTE — Anesthesia Procedure Notes (Cosign Needed)
Procedure Name: Intubation Date/Time: 01/23/2024 11:07 AM  Performed by: Leonides Grills, MDPre-anesthesia Checklist: Emergency Drugs available, Patient identified, Suction available, Patient being monitored and Timeout performed Patient Re-evaluated:Patient Re-evaluated prior to induction Oxygen Delivery Method: Circle system utilized Preoxygenation: Pre-oxygenation with 100% oxygen Induction Type: IV induction Ventilation: Mask ventilation without difficulty Laryngoscope Size: Glidescope and 3 Grade View: Grade I Tube type: Oral Tube size: 6.5 mm Number of attempts: 1 Airway Equipment and Method: Video-laryngoscopy and Stylet (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

## 2024-01-23 NOTE — Op Note (Signed)
Operative Note  Preoperative diagnosis:  1.  Bladder mass   Postoperative diagnosis: 1.  Bladder mass  Procedure(s): 1.  Medium TURBT 3.5 cm mass   Surgeon: Vilma Prader MD  Assistants:  None  Anesthesia:  General  Complications:  None  EBL:  Minimal   Specimens: 1. Bladder tumor  Drains/Catheters: 1.  38fr urethral catheter 10cc in balloon   Intraoperative findings:   Bladder tumor 3.5 Cm posterior to the R UO  Number of tumors: 1        Size of largest tumor:         3.5 cm    Characteristics of tumors:     Papillary    Recurrent             Primary     Suspicious for Carcinoma in situ:    No   Bimanual exam under anesthesia:       No  Visually complete resection:                Yes  Visualization of detrusor muscle in resection base:        Yes  Visual evaluation for perforation:        Very thin bladder no evidence of perforation though    Indication:  Dana [name] is a 89 y.o. female with a bladder lesion on the R posterior bladder wall. All the risks, benefits were discussed with the patient to include but not limited to infection, pain, bleeding, damage to adjacent structures, need for further operations, adverse reaction to anesthesia and death.  Patient understands these risks and agrees to proceed with the operation as planned.    Description of procedure: After informed consent was obtained from the patient, the patient was taken to the operating room. General anesthesia was administered. The patient was placed in dorsal lithotomy position and prepped and draped in usual sterile fashion. Sequential compression devices were applied to lower extremities at the beginning of the case for DVT prophylaxis. Antibiotics were infused prior to surgery start time. A surgical time-out was performed to properly identify the patient, the surgery to be performed, and the surgical site.     We then passed the 21-French rigid cystoscope down the urethra and into  the bladder under direct vision without any difficulty. The anterior urethral was normal. Once in the bladder, systematic evaluation of bladder revealed a 3.5 cm tumor posterior to the R ureteral orifice . The ureteral orfices were in orthotopic position and not involved.   We then removed the cystoscope and then passed down the 26 French resectoscope sheath down the urethra into the bladder under direct vision with the visual obturator. The tumor was resected down to muscle. The TUR bladder tumor chips were retrieved from the bladder and each region of resection was passed off the field as a separate specimen.  Hemostasis was achieved using electrocautery. We then proceeded with removing the resectoscope and then placed in a 18 fr Foley catheter.  The patient tolerated the procedure well with no complication and was awoken from anesthesia and taken to recovery in stable condition.     The decision was made to forgo instilling gemcitabine due to the thin bladder near the base of the more posterior resection.    Plan:  will restart eliquis tomorrow.  Patient instructed to remove her catheter at home on 01/26/24.   Vilma Prader  [facility]

## 2024-01-23 NOTE — Discharge Instructions (Addendum)
Transurethral Resection of Bladder Tumor (TURBT) or Bladder Biopsy   Definition:  Transurethral Resection of the Bladder Tumor is a surgical procedure used to diagnose and remove tumors within the bladder. TURBT is the most common treatment for early stage bladder cancer.  General instructions:     Your recent bladder surgery requires very little post hospital care but some definite precautions.  Despite the fact that no skin incisions were used, the area around the bladder incisions are raw and covered with scabs to promote healing and prevent bleeding. Certain precautions are needed to insure that the scabs are not disturbed over the next 2-4 weeks while the healing proceeds.  Because the raw surface inside your bladder and the irritating effects of urine you may expect frequency of urination and/or urgency (a stronger desire to urinate) and perhaps even getting up at night more often. This will usually resolve or improve slowly over the healing period. You may see some blood in your urine over the first 6 weeks. Do not be alarmed, even if the urine was clear for a while. Get off your feet and drink lots of fluids until clearing occurs. If you start to pass clots or don't improve call us.  Catheter: Due to the nature of this surgery some patients need to be discharged with a catheter. If you are discharged with a catheter instructions on how to care for the catheter will be attached to your discharge paperwork. You will be called to schedule an appointment to remove the catheter.   Diet:  You may return to your normal diet immediately. Because of the raw surface of your bladder, alcohol, spicy foods, foods high in acid and drinks with caffeine may cause irritation or frequency and should be used in moderation. To keep your urine flowing freely and avoid constipation, drink plenty of fluids during the day (8-10 glasses). Tip: Avoid cranberry juice because it is very acidic.  Activity:  Do not  lift more than 10 LBS for 2 weeks Do not strain with bowel movents.  Your physical activity doesn't need to be restricted. However, if you are very active, you may see some blood in the urine. We suggest that you reduce your activity under the circumstances until the bleeding has stopped.  Bowels:  It is important to keep your bowels regular during the postoperative period. Straining with bowel movements can cause bleeding. A bowel movement every other day is reasonable. Use a mild laxative if needed, such as milk of magnesia 2-3 tablespoons, or 2 Dulcolax tablets. Call if you continue to have problems. If you had been taking narcotics for pain, before, during or after your surgery, you may be constipated. Take a laxative if necessary.    Medication:  You should resume your pre-surgery medications unless told not to. In addition you may be given an antibiotic to prevent or treat infection. Antibiotics are not always necessary. All medication should be taken as prescribed until the bottles are finished unless you are having an unusual reaction to one of the drugs.  You may restart you Eliquis tomorrow Evening. On 01/24/24.      You have been sent home with a catheter. Please remove this by aspirating the 10cc of water from the balloon port. This is the small fork of the Y. Once you have confirmed that all fluid has been removed from the catheter (do this by aspirating from the balloon fort several time until no more fluid comes out) you can remove the catheter  from the bladder by pulling it out. You can do this on 01/26/24.

## 2024-01-23 NOTE — Anesthesia Postprocedure Evaluation (Signed)
Anesthesia Post Note  Patient: BELL CARBO  Procedure(s) Performed: TRANSURETHRAL RESECTION OF BLADDER TUMOR (TURBT)     Patient location during evaluation: PACU Anesthesia Type: General Level of consciousness: awake Pain management: pain level controlled Vital Signs Assessment: post-procedure vital signs reviewed and stable Respiratory status: spontaneous breathing, nonlabored ventilation and respiratory function stable Cardiovascular status: blood pressure returned to baseline and stable Postop Assessment: no apparent nausea or vomiting Anesthetic complications: no   No notable events documented.  Last Vitals:  Vitals:   01/23/24 1230 01/23/24 1300  BP: (!) 146/82 138/83  Pulse: 94 85  Resp: 19   Temp: 36.6 C 36.6 C  SpO2: 93% 97%    Last Pain:  Vitals:   01/23/24 1334  TempSrc:   PainSc: 0-No pain                 Trevor Wilkie P Rosemaria Inabinet

## 2024-01-23 NOTE — Transfer of Care (Signed)
Immediate Anesthesia Transfer of Care Note  Patient: Dana [name]  Procedure(s) Performed: TRANSURETHRAL RESECTION OF BLADDER TUMOR (TURBT)  Patient Location: PACU  Anesthesia Type:General  Level of Consciousness: awake  Airway & Oxygen Therapy: Patient Spontanous Breathing and Patient connected to face mask  Post-op Assessment: Report given to RN and Post -op Vital signs reviewed and stable  Post vital signs: Reviewed and stable  Last Vitals:  Vitals Value Taken Time  BP 128/88 01/23/24 1149  Temp    Pulse 94 01/23/24 1151  Resp 18 01/23/24 1151  SpO2 81 % 01/23/24 1151  Vitals shown include unfiled device data.  Last Pain:  Vitals:   01/23/24 0928  TempSrc:   PainSc: 0-No pain      Patients Stated Pain Goal: 5 (01/23/24 5366)  Complications: No notable events documented.

## 2024-01-24 ENCOUNTER — Other Ambulatory Visit: Payer: Self-pay

## 2024-01-24 LAB — SURGICAL PATHOLOGY

## 2024-02-03 DIAGNOSIS — C672 Malignant neoplasm of lateral wall of bladder: Secondary | ICD-10-CM | POA: Diagnosis not present

## 2024-02-04 DIAGNOSIS — D630 Anemia in neoplastic disease: Secondary | ICD-10-CM | POA: Diagnosis not present

## 2024-02-04 DIAGNOSIS — D414 Neoplasm of uncertain behavior of bladder: Secondary | ICD-10-CM | POA: Diagnosis not present

## 2024-02-04 DIAGNOSIS — E440 Moderate protein-calorie malnutrition: Secondary | ICD-10-CM | POA: Diagnosis not present

## 2024-02-04 DIAGNOSIS — Z86711 Personal history of pulmonary embolism: Secondary | ICD-10-CM | POA: Diagnosis not present

## 2024-02-04 DIAGNOSIS — Z87442 Personal history of urinary calculi: Secondary | ICD-10-CM | POA: Diagnosis not present

## 2024-02-04 DIAGNOSIS — I110 Hypertensive heart disease with heart failure: Secondary | ICD-10-CM | POA: Diagnosis not present

## 2024-02-04 DIAGNOSIS — M1990 Unspecified osteoarthritis, unspecified site: Secondary | ICD-10-CM | POA: Diagnosis not present

## 2024-02-04 DIAGNOSIS — S1181XD Laceration without foreign body of other specified part of neck, subsequent encounter: Secondary | ICD-10-CM | POA: Diagnosis not present

## 2024-02-04 DIAGNOSIS — J449 Chronic obstructive pulmonary disease, unspecified: Secondary | ICD-10-CM | POA: Diagnosis not present

## 2024-02-04 DIAGNOSIS — J8410 Pulmonary fibrosis, unspecified: Secondary | ICD-10-CM | POA: Diagnosis not present

## 2024-02-04 DIAGNOSIS — Z7901 Long term (current) use of anticoagulants: Secondary | ICD-10-CM | POA: Diagnosis not present

## 2024-02-04 DIAGNOSIS — M810 Age-related osteoporosis without current pathological fracture: Secondary | ICD-10-CM | POA: Diagnosis not present

## 2024-02-04 DIAGNOSIS — Z9181 History of falling: Secondary | ICD-10-CM | POA: Diagnosis not present

## 2024-02-04 DIAGNOSIS — Z87891 Personal history of nicotine dependence: Secondary | ICD-10-CM | POA: Diagnosis not present

## 2024-02-04 DIAGNOSIS — K219 Gastro-esophageal reflux disease without esophagitis: Secondary | ICD-10-CM | POA: Diagnosis not present

## 2024-02-04 DIAGNOSIS — Z483 Aftercare following surgery for neoplasm: Secondary | ICD-10-CM | POA: Diagnosis not present

## 2024-02-04 DIAGNOSIS — I5030 Unspecified diastolic (congestive) heart failure: Secondary | ICD-10-CM | POA: Diagnosis not present

## 2024-02-04 DIAGNOSIS — M069 Rheumatoid arthritis, unspecified: Secondary | ICD-10-CM | POA: Diagnosis not present

## 2024-02-04 DIAGNOSIS — Z85118 Personal history of other malignant neoplasm of bronchus and lung: Secondary | ICD-10-CM | POA: Diagnosis not present

## 2024-02-04 DIAGNOSIS — E7800 Pure hypercholesterolemia, unspecified: Secondary | ICD-10-CM | POA: Diagnosis not present

## 2024-02-13 DIAGNOSIS — D414 Neoplasm of uncertain behavior of bladder: Secondary | ICD-10-CM | POA: Diagnosis not present

## 2024-02-13 DIAGNOSIS — Z483 Aftercare following surgery for neoplasm: Secondary | ICD-10-CM | POA: Diagnosis not present

## 2024-02-13 DIAGNOSIS — D630 Anemia in neoplastic disease: Secondary | ICD-10-CM | POA: Diagnosis not present

## 2024-02-13 DIAGNOSIS — M810 Age-related osteoporosis without current pathological fracture: Secondary | ICD-10-CM | POA: Diagnosis not present

## 2024-02-13 DIAGNOSIS — I110 Hypertensive heart disease with heart failure: Secondary | ICD-10-CM | POA: Diagnosis not present

## 2024-02-13 DIAGNOSIS — M069 Rheumatoid arthritis, unspecified: Secondary | ICD-10-CM | POA: Diagnosis not present

## 2024-02-13 DIAGNOSIS — J8410 Pulmonary fibrosis, unspecified: Secondary | ICD-10-CM | POA: Diagnosis not present

## 2024-02-13 DIAGNOSIS — E7800 Pure hypercholesterolemia, unspecified: Secondary | ICD-10-CM | POA: Diagnosis not present

## 2024-02-13 DIAGNOSIS — K219 Gastro-esophageal reflux disease without esophagitis: Secondary | ICD-10-CM | POA: Diagnosis not present

## 2024-02-13 DIAGNOSIS — Z86711 Personal history of pulmonary embolism: Secondary | ICD-10-CM | POA: Diagnosis not present

## 2024-02-13 DIAGNOSIS — M1990 Unspecified osteoarthritis, unspecified site: Secondary | ICD-10-CM | POA: Diagnosis not present

## 2024-02-13 DIAGNOSIS — I5030 Unspecified diastolic (congestive) heart failure: Secondary | ICD-10-CM | POA: Diagnosis not present

## 2024-02-13 DIAGNOSIS — S1181XD Laceration without foreign body of other specified part of neck, subsequent encounter: Secondary | ICD-10-CM | POA: Diagnosis not present

## 2024-02-13 DIAGNOSIS — Z85118 Personal history of other malignant neoplasm of bronchus and lung: Secondary | ICD-10-CM | POA: Diagnosis not present

## 2024-02-13 DIAGNOSIS — E440 Moderate protein-calorie malnutrition: Secondary | ICD-10-CM | POA: Diagnosis not present

## 2024-02-13 DIAGNOSIS — J449 Chronic obstructive pulmonary disease, unspecified: Secondary | ICD-10-CM | POA: Diagnosis not present

## 2024-02-13 DIAGNOSIS — Z87891 Personal history of nicotine dependence: Secondary | ICD-10-CM | POA: Diagnosis not present

## 2024-02-13 DIAGNOSIS — Z87442 Personal history of urinary calculi: Secondary | ICD-10-CM | POA: Diagnosis not present

## 2024-02-13 DIAGNOSIS — Z7901 Long term (current) use of anticoagulants: Secondary | ICD-10-CM | POA: Diagnosis not present

## 2024-02-13 DIAGNOSIS — Z9181 History of falling: Secondary | ICD-10-CM | POA: Diagnosis not present

## 2024-02-14 ENCOUNTER — Other Ambulatory Visit: Payer: Self-pay

## 2024-02-14 DIAGNOSIS — Z483 Aftercare following surgery for neoplasm: Secondary | ICD-10-CM | POA: Diagnosis not present

## 2024-02-14 DIAGNOSIS — M810 Age-related osteoporosis without current pathological fracture: Secondary | ICD-10-CM | POA: Diagnosis not present

## 2024-02-14 DIAGNOSIS — Z7901 Long term (current) use of anticoagulants: Secondary | ICD-10-CM | POA: Diagnosis not present

## 2024-02-14 DIAGNOSIS — S1181XD Laceration without foreign body of other specified part of neck, subsequent encounter: Secondary | ICD-10-CM | POA: Diagnosis not present

## 2024-02-14 DIAGNOSIS — E440 Moderate protein-calorie malnutrition: Secondary | ICD-10-CM | POA: Diagnosis not present

## 2024-02-14 DIAGNOSIS — J8410 Pulmonary fibrosis, unspecified: Secondary | ICD-10-CM | POA: Diagnosis not present

## 2024-02-14 DIAGNOSIS — Z85118 Personal history of other malignant neoplasm of bronchus and lung: Secondary | ICD-10-CM | POA: Diagnosis not present

## 2024-02-14 DIAGNOSIS — E7800 Pure hypercholesterolemia, unspecified: Secondary | ICD-10-CM | POA: Diagnosis not present

## 2024-02-14 DIAGNOSIS — D630 Anemia in neoplastic disease: Secondary | ICD-10-CM | POA: Diagnosis not present

## 2024-02-14 DIAGNOSIS — I5030 Unspecified diastolic (congestive) heart failure: Secondary | ICD-10-CM | POA: Diagnosis not present

## 2024-02-14 DIAGNOSIS — M069 Rheumatoid arthritis, unspecified: Secondary | ICD-10-CM | POA: Diagnosis not present

## 2024-02-14 DIAGNOSIS — M1990 Unspecified osteoarthritis, unspecified site: Secondary | ICD-10-CM | POA: Diagnosis not present

## 2024-02-14 DIAGNOSIS — Z86711 Personal history of pulmonary embolism: Secondary | ICD-10-CM | POA: Diagnosis not present

## 2024-02-14 DIAGNOSIS — I110 Hypertensive heart disease with heart failure: Secondary | ICD-10-CM | POA: Diagnosis not present

## 2024-02-14 DIAGNOSIS — J449 Chronic obstructive pulmonary disease, unspecified: Secondary | ICD-10-CM | POA: Diagnosis not present

## 2024-02-14 DIAGNOSIS — Z87442 Personal history of urinary calculi: Secondary | ICD-10-CM | POA: Diagnosis not present

## 2024-02-14 DIAGNOSIS — D414 Neoplasm of uncertain behavior of bladder: Secondary | ICD-10-CM | POA: Diagnosis not present

## 2024-02-14 DIAGNOSIS — K219 Gastro-esophageal reflux disease without esophagitis: Secondary | ICD-10-CM | POA: Diagnosis not present

## 2024-02-14 DIAGNOSIS — Z9181 History of falling: Secondary | ICD-10-CM | POA: Diagnosis not present

## 2024-02-14 DIAGNOSIS — Z87891 Personal history of nicotine dependence: Secondary | ICD-10-CM | POA: Diagnosis not present

## 2024-02-17 DIAGNOSIS — J8410 Pulmonary fibrosis, unspecified: Secondary | ICD-10-CM | POA: Diagnosis not present

## 2024-02-17 DIAGNOSIS — I5030 Unspecified diastolic (congestive) heart failure: Secondary | ICD-10-CM | POA: Diagnosis not present

## 2024-02-17 DIAGNOSIS — M1990 Unspecified osteoarthritis, unspecified site: Secondary | ICD-10-CM | POA: Diagnosis not present

## 2024-02-17 DIAGNOSIS — Z87442 Personal history of urinary calculi: Secondary | ICD-10-CM | POA: Diagnosis not present

## 2024-02-17 DIAGNOSIS — I110 Hypertensive heart disease with heart failure: Secondary | ICD-10-CM | POA: Diagnosis not present

## 2024-02-17 DIAGNOSIS — Z86711 Personal history of pulmonary embolism: Secondary | ICD-10-CM | POA: Diagnosis not present

## 2024-02-17 DIAGNOSIS — Z483 Aftercare following surgery for neoplasm: Secondary | ICD-10-CM | POA: Diagnosis not present

## 2024-02-17 DIAGNOSIS — D414 Neoplasm of uncertain behavior of bladder: Secondary | ICD-10-CM | POA: Diagnosis not present

## 2024-02-17 DIAGNOSIS — M810 Age-related osteoporosis without current pathological fracture: Secondary | ICD-10-CM | POA: Diagnosis not present

## 2024-02-17 DIAGNOSIS — S1181XD Laceration without foreign body of other specified part of neck, subsequent encounter: Secondary | ICD-10-CM | POA: Diagnosis not present

## 2024-02-17 DIAGNOSIS — Z85118 Personal history of other malignant neoplasm of bronchus and lung: Secondary | ICD-10-CM | POA: Diagnosis not present

## 2024-02-17 DIAGNOSIS — K219 Gastro-esophageal reflux disease without esophagitis: Secondary | ICD-10-CM | POA: Diagnosis not present

## 2024-02-17 DIAGNOSIS — Z87891 Personal history of nicotine dependence: Secondary | ICD-10-CM | POA: Diagnosis not present

## 2024-02-17 DIAGNOSIS — J449 Chronic obstructive pulmonary disease, unspecified: Secondary | ICD-10-CM | POA: Diagnosis not present

## 2024-02-17 DIAGNOSIS — Z9181 History of falling: Secondary | ICD-10-CM | POA: Diagnosis not present

## 2024-02-17 DIAGNOSIS — M069 Rheumatoid arthritis, unspecified: Secondary | ICD-10-CM | POA: Diagnosis not present

## 2024-02-17 DIAGNOSIS — E7800 Pure hypercholesterolemia, unspecified: Secondary | ICD-10-CM | POA: Diagnosis not present

## 2024-02-17 DIAGNOSIS — E440 Moderate protein-calorie malnutrition: Secondary | ICD-10-CM | POA: Diagnosis not present

## 2024-02-17 DIAGNOSIS — D630 Anemia in neoplastic disease: Secondary | ICD-10-CM | POA: Diagnosis not present

## 2024-02-17 DIAGNOSIS — Z7901 Long term (current) use of anticoagulants: Secondary | ICD-10-CM | POA: Diagnosis not present

## 2024-02-19 DIAGNOSIS — D630 Anemia in neoplastic disease: Secondary | ICD-10-CM | POA: Diagnosis not present

## 2024-02-19 DIAGNOSIS — K219 Gastro-esophageal reflux disease without esophagitis: Secondary | ICD-10-CM | POA: Diagnosis not present

## 2024-02-19 DIAGNOSIS — M1990 Unspecified osteoarthritis, unspecified site: Secondary | ICD-10-CM | POA: Diagnosis not present

## 2024-02-19 DIAGNOSIS — M810 Age-related osteoporosis without current pathological fracture: Secondary | ICD-10-CM | POA: Diagnosis not present

## 2024-02-19 DIAGNOSIS — J449 Chronic obstructive pulmonary disease, unspecified: Secondary | ICD-10-CM | POA: Diagnosis not present

## 2024-02-19 DIAGNOSIS — J8410 Pulmonary fibrosis, unspecified: Secondary | ICD-10-CM | POA: Diagnosis not present

## 2024-02-19 DIAGNOSIS — S1181XD Laceration without foreign body of other specified part of neck, subsequent encounter: Secondary | ICD-10-CM | POA: Diagnosis not present

## 2024-02-19 DIAGNOSIS — Z85118 Personal history of other malignant neoplasm of bronchus and lung: Secondary | ICD-10-CM | POA: Diagnosis not present

## 2024-02-19 DIAGNOSIS — E7800 Pure hypercholesterolemia, unspecified: Secondary | ICD-10-CM | POA: Diagnosis not present

## 2024-02-19 DIAGNOSIS — I110 Hypertensive heart disease with heart failure: Secondary | ICD-10-CM | POA: Diagnosis not present

## 2024-02-19 DIAGNOSIS — Z483 Aftercare following surgery for neoplasm: Secondary | ICD-10-CM | POA: Diagnosis not present

## 2024-02-19 DIAGNOSIS — E440 Moderate protein-calorie malnutrition: Secondary | ICD-10-CM | POA: Diagnosis not present

## 2024-02-19 DIAGNOSIS — Z87442 Personal history of urinary calculi: Secondary | ICD-10-CM | POA: Diagnosis not present

## 2024-02-19 DIAGNOSIS — Z87891 Personal history of nicotine dependence: Secondary | ICD-10-CM | POA: Diagnosis not present

## 2024-02-19 DIAGNOSIS — I5030 Unspecified diastolic (congestive) heart failure: Secondary | ICD-10-CM | POA: Diagnosis not present

## 2024-02-19 DIAGNOSIS — Z86711 Personal history of pulmonary embolism: Secondary | ICD-10-CM | POA: Diagnosis not present

## 2024-02-19 DIAGNOSIS — D414 Neoplasm of uncertain behavior of bladder: Secondary | ICD-10-CM | POA: Diagnosis not present

## 2024-02-19 DIAGNOSIS — Z7901 Long term (current) use of anticoagulants: Secondary | ICD-10-CM | POA: Diagnosis not present

## 2024-02-19 DIAGNOSIS — M069 Rheumatoid arthritis, unspecified: Secondary | ICD-10-CM | POA: Diagnosis not present

## 2024-02-19 DIAGNOSIS — Z9181 History of falling: Secondary | ICD-10-CM | POA: Diagnosis not present

## 2024-02-21 ENCOUNTER — Other Ambulatory Visit: Payer: Self-pay | Admitting: Physician Assistant

## 2024-02-21 DIAGNOSIS — R634 Abnormal weight loss: Secondary | ICD-10-CM

## 2024-02-24 DIAGNOSIS — Z87442 Personal history of urinary calculi: Secondary | ICD-10-CM | POA: Diagnosis not present

## 2024-02-24 DIAGNOSIS — I110 Hypertensive heart disease with heart failure: Secondary | ICD-10-CM | POA: Diagnosis not present

## 2024-02-24 DIAGNOSIS — E7800 Pure hypercholesterolemia, unspecified: Secondary | ICD-10-CM | POA: Diagnosis not present

## 2024-02-24 DIAGNOSIS — K219 Gastro-esophageal reflux disease without esophagitis: Secondary | ICD-10-CM | POA: Diagnosis not present

## 2024-02-24 DIAGNOSIS — D414 Neoplasm of uncertain behavior of bladder: Secondary | ICD-10-CM | POA: Diagnosis not present

## 2024-02-24 DIAGNOSIS — S1181XD Laceration without foreign body of other specified part of neck, subsequent encounter: Secondary | ICD-10-CM | POA: Diagnosis not present

## 2024-02-24 DIAGNOSIS — J8410 Pulmonary fibrosis, unspecified: Secondary | ICD-10-CM | POA: Diagnosis not present

## 2024-02-24 DIAGNOSIS — M810 Age-related osteoporosis without current pathological fracture: Secondary | ICD-10-CM | POA: Diagnosis not present

## 2024-02-24 DIAGNOSIS — Z9181 History of falling: Secondary | ICD-10-CM | POA: Diagnosis not present

## 2024-02-24 DIAGNOSIS — I5030 Unspecified diastolic (congestive) heart failure: Secondary | ICD-10-CM | POA: Diagnosis not present

## 2024-02-24 DIAGNOSIS — Z7901 Long term (current) use of anticoagulants: Secondary | ICD-10-CM | POA: Diagnosis not present

## 2024-02-24 DIAGNOSIS — Z86711 Personal history of pulmonary embolism: Secondary | ICD-10-CM | POA: Diagnosis not present

## 2024-02-24 DIAGNOSIS — Z85118 Personal history of other malignant neoplasm of bronchus and lung: Secondary | ICD-10-CM | POA: Diagnosis not present

## 2024-02-24 DIAGNOSIS — M1990 Unspecified osteoarthritis, unspecified site: Secondary | ICD-10-CM | POA: Diagnosis not present

## 2024-02-24 DIAGNOSIS — Z87891 Personal history of nicotine dependence: Secondary | ICD-10-CM | POA: Diagnosis not present

## 2024-02-24 DIAGNOSIS — J449 Chronic obstructive pulmonary disease, unspecified: Secondary | ICD-10-CM | POA: Diagnosis not present

## 2024-02-24 DIAGNOSIS — D630 Anemia in neoplastic disease: Secondary | ICD-10-CM | POA: Diagnosis not present

## 2024-02-24 DIAGNOSIS — Z483 Aftercare following surgery for neoplasm: Secondary | ICD-10-CM | POA: Diagnosis not present

## 2024-02-24 DIAGNOSIS — M069 Rheumatoid arthritis, unspecified: Secondary | ICD-10-CM | POA: Diagnosis not present

## 2024-02-24 DIAGNOSIS — E440 Moderate protein-calorie malnutrition: Secondary | ICD-10-CM | POA: Diagnosis not present

## 2024-02-26 DIAGNOSIS — C672 Malignant neoplasm of lateral wall of bladder: Secondary | ICD-10-CM | POA: Diagnosis not present

## 2024-02-26 DIAGNOSIS — R8271 Bacteriuria: Secondary | ICD-10-CM | POA: Diagnosis not present

## 2024-02-27 DIAGNOSIS — C44311 Basal cell carcinoma of skin of nose: Secondary | ICD-10-CM | POA: Diagnosis not present

## 2024-03-04 DIAGNOSIS — I110 Hypertensive heart disease with heart failure: Secondary | ICD-10-CM | POA: Diagnosis not present

## 2024-03-04 DIAGNOSIS — M1990 Unspecified osteoarthritis, unspecified site: Secondary | ICD-10-CM | POA: Diagnosis not present

## 2024-03-04 DIAGNOSIS — Z9181 History of falling: Secondary | ICD-10-CM | POA: Diagnosis not present

## 2024-03-04 DIAGNOSIS — C672 Malignant neoplasm of lateral wall of bladder: Secondary | ICD-10-CM | POA: Diagnosis not present

## 2024-03-04 DIAGNOSIS — I5030 Unspecified diastolic (congestive) heart failure: Secondary | ICD-10-CM | POA: Diagnosis not present

## 2024-03-04 DIAGNOSIS — M069 Rheumatoid arthritis, unspecified: Secondary | ICD-10-CM | POA: Diagnosis not present

## 2024-03-04 DIAGNOSIS — Z7901 Long term (current) use of anticoagulants: Secondary | ICD-10-CM | POA: Diagnosis not present

## 2024-03-04 DIAGNOSIS — M810 Age-related osteoporosis without current pathological fracture: Secondary | ICD-10-CM | POA: Diagnosis not present

## 2024-03-04 DIAGNOSIS — J449 Chronic obstructive pulmonary disease, unspecified: Secondary | ICD-10-CM | POA: Diagnosis not present

## 2024-03-04 DIAGNOSIS — Z86711 Personal history of pulmonary embolism: Secondary | ICD-10-CM | POA: Diagnosis not present

## 2024-03-04 DIAGNOSIS — D414 Neoplasm of uncertain behavior of bladder: Secondary | ICD-10-CM | POA: Diagnosis not present

## 2024-03-04 DIAGNOSIS — Z483 Aftercare following surgery for neoplasm: Secondary | ICD-10-CM | POA: Diagnosis not present

## 2024-03-04 DIAGNOSIS — Z87891 Personal history of nicotine dependence: Secondary | ICD-10-CM | POA: Diagnosis not present

## 2024-03-04 DIAGNOSIS — Z87442 Personal history of urinary calculi: Secondary | ICD-10-CM | POA: Diagnosis not present

## 2024-03-04 DIAGNOSIS — Z85118 Personal history of other malignant neoplasm of bronchus and lung: Secondary | ICD-10-CM | POA: Diagnosis not present

## 2024-03-04 DIAGNOSIS — E440 Moderate protein-calorie malnutrition: Secondary | ICD-10-CM | POA: Diagnosis not present

## 2024-03-04 DIAGNOSIS — E7800 Pure hypercholesterolemia, unspecified: Secondary | ICD-10-CM | POA: Diagnosis not present

## 2024-03-04 DIAGNOSIS — K219 Gastro-esophageal reflux disease without esophagitis: Secondary | ICD-10-CM | POA: Diagnosis not present

## 2024-03-04 DIAGNOSIS — J8410 Pulmonary fibrosis, unspecified: Secondary | ICD-10-CM | POA: Diagnosis not present

## 2024-03-04 DIAGNOSIS — D630 Anemia in neoplastic disease: Secondary | ICD-10-CM | POA: Diagnosis not present

## 2024-03-04 DIAGNOSIS — S1181XD Laceration without foreign body of other specified part of neck, subsequent encounter: Secondary | ICD-10-CM | POA: Diagnosis not present

## 2024-03-06 DIAGNOSIS — Z85118 Personal history of other malignant neoplasm of bronchus and lung: Secondary | ICD-10-CM | POA: Diagnosis not present

## 2024-03-06 DIAGNOSIS — Z86711 Personal history of pulmonary embolism: Secondary | ICD-10-CM | POA: Diagnosis not present

## 2024-03-06 DIAGNOSIS — M810 Age-related osteoporosis without current pathological fracture: Secondary | ICD-10-CM | POA: Diagnosis not present

## 2024-03-06 DIAGNOSIS — M069 Rheumatoid arthritis, unspecified: Secondary | ICD-10-CM | POA: Diagnosis not present

## 2024-03-06 DIAGNOSIS — I110 Hypertensive heart disease with heart failure: Secondary | ICD-10-CM | POA: Diagnosis not present

## 2024-03-06 DIAGNOSIS — I5030 Unspecified diastolic (congestive) heart failure: Secondary | ICD-10-CM | POA: Diagnosis not present

## 2024-03-06 DIAGNOSIS — E7800 Pure hypercholesterolemia, unspecified: Secondary | ICD-10-CM | POA: Diagnosis not present

## 2024-03-06 DIAGNOSIS — D414 Neoplasm of uncertain behavior of bladder: Secondary | ICD-10-CM | POA: Diagnosis not present

## 2024-03-06 DIAGNOSIS — Z483 Aftercare following surgery for neoplasm: Secondary | ICD-10-CM | POA: Diagnosis not present

## 2024-03-06 DIAGNOSIS — K219 Gastro-esophageal reflux disease without esophagitis: Secondary | ICD-10-CM | POA: Diagnosis not present

## 2024-03-06 DIAGNOSIS — J449 Chronic obstructive pulmonary disease, unspecified: Secondary | ICD-10-CM | POA: Diagnosis not present

## 2024-03-06 DIAGNOSIS — Z87442 Personal history of urinary calculi: Secondary | ICD-10-CM | POA: Diagnosis not present

## 2024-03-06 DIAGNOSIS — S1181XD Laceration without foreign body of other specified part of neck, subsequent encounter: Secondary | ICD-10-CM | POA: Diagnosis not present

## 2024-03-06 DIAGNOSIS — J8410 Pulmonary fibrosis, unspecified: Secondary | ICD-10-CM | POA: Diagnosis not present

## 2024-03-06 DIAGNOSIS — M1990 Unspecified osteoarthritis, unspecified site: Secondary | ICD-10-CM | POA: Diagnosis not present

## 2024-03-06 DIAGNOSIS — Z9181 History of falling: Secondary | ICD-10-CM | POA: Diagnosis not present

## 2024-03-06 DIAGNOSIS — Z7901 Long term (current) use of anticoagulants: Secondary | ICD-10-CM | POA: Diagnosis not present

## 2024-03-06 DIAGNOSIS — Z87891 Personal history of nicotine dependence: Secondary | ICD-10-CM | POA: Diagnosis not present

## 2024-03-06 DIAGNOSIS — D630 Anemia in neoplastic disease: Secondary | ICD-10-CM | POA: Diagnosis not present

## 2024-03-06 DIAGNOSIS — E440 Moderate protein-calorie malnutrition: Secondary | ICD-10-CM | POA: Diagnosis not present

## 2024-03-09 ENCOUNTER — Telehealth: Payer: Self-pay | Admitting: Family Medicine

## 2024-03-09 NOTE — Telephone Encounter (Signed)
 Patients daughter also stated that Baby had gotten a letter from [place] for approval for an injection.

## 2024-03-09 NOTE — Telephone Encounter (Signed)
 Noted and labeled.

## 2024-03-09 NOTE — Telephone Encounter (Signed)
 Patients daughter in law called and stated that the patient wanted to get an injection. It looks like she was approved for zilretta. I have her scheduled for 03/18/2024 at 2:45 for an injection, just wanted to confirm this for zilretta?

## 2024-03-11 ENCOUNTER — Other Ambulatory Visit: Payer: Self-pay | Admitting: Internal Medicine

## 2024-03-11 ENCOUNTER — Other Ambulatory Visit: Payer: Self-pay

## 2024-03-11 ENCOUNTER — Other Ambulatory Visit (HOSPITAL_COMMUNITY): Payer: Self-pay

## 2024-03-11 DIAGNOSIS — C3490 Malignant neoplasm of unspecified part of unspecified bronchus or lung: Secondary | ICD-10-CM

## 2024-03-11 DIAGNOSIS — C672 Malignant neoplasm of lateral wall of bladder: Secondary | ICD-10-CM | POA: Diagnosis not present

## 2024-03-11 MED ORDER — ERLOTINIB HCL 100 MG PO TABS
100.0000 mg | ORAL_TABLET | Freq: Every day | ORAL | 3 refills | Status: AC
  Filled 2024-03-11: qty 30, 30d supply, fill #0
  Filled 2024-04-08 (×2): qty 30, 30d supply, fill #1
  Filled 2024-05-14: qty 30, 30d supply, fill #2
  Filled 2024-06-11 – 2024-06-29 (×2): qty 30, 30d supply, fill #3

## 2024-03-11 NOTE — Progress Notes (Signed)
 Specialty Pharmacy Ongoing Clinical Assessment Note  Dana [name] is a 89 y.o. female who is being followed by the specialty pharmacy service for RxSp Oncology   Patient's specialty medication(s) reviewed today: Erlotinib HCl (TARCEVA)   Missed doses in the last 4 weeks: 0   Patient/Caregiver did not have any additional questions or concerns.   Therapeutic benefit summary: Patient is achieving benefit   Adverse events/side effects summary: No adverse events/side effects   Patient's therapy is appropriate to: Continue    Goals Addressed             This Visit's Progress    Slow Disease Progression       Patient is on track. Patient will maintain adherence         Follow up:  6 months  Otto Herb Specialty Pharmacist

## 2024-03-11 NOTE — Progress Notes (Signed)
 Specialty Pharmacy Refill Coordination Note  Dana [name] is a 89 y.o. female , patients daughter Darel Hong was contacted today regarding refills of specialty medication(s) Erlotinib HCl [facility])   Patient requested Delivery   Delivery date: 03/13/24   Verified address: [address]  [place] [address]   Medication will be filled on 03/12/24.   This fill date is pending response to refill request from provider. Patient is aware and if they have not received fill by intended date they must follow up with pharmacy.

## 2024-03-12 DIAGNOSIS — Z87442 Personal history of urinary calculi: Secondary | ICD-10-CM | POA: Diagnosis not present

## 2024-03-12 DIAGNOSIS — I5030 Unspecified diastolic (congestive) heart failure: Secondary | ICD-10-CM | POA: Diagnosis not present

## 2024-03-12 DIAGNOSIS — E440 Moderate protein-calorie malnutrition: Secondary | ICD-10-CM | POA: Diagnosis not present

## 2024-03-12 DIAGNOSIS — S1181XD Laceration without foreign body of other specified part of neck, subsequent encounter: Secondary | ICD-10-CM | POA: Diagnosis not present

## 2024-03-12 DIAGNOSIS — Z87891 Personal history of nicotine dependence: Secondary | ICD-10-CM | POA: Diagnosis not present

## 2024-03-12 DIAGNOSIS — Z7901 Long term (current) use of anticoagulants: Secondary | ICD-10-CM | POA: Diagnosis not present

## 2024-03-12 DIAGNOSIS — Z9181 History of falling: Secondary | ICD-10-CM | POA: Diagnosis not present

## 2024-03-12 DIAGNOSIS — D630 Anemia in neoplastic disease: Secondary | ICD-10-CM | POA: Diagnosis not present

## 2024-03-12 DIAGNOSIS — M1990 Unspecified osteoarthritis, unspecified site: Secondary | ICD-10-CM | POA: Diagnosis not present

## 2024-03-12 DIAGNOSIS — M810 Age-related osteoporosis without current pathological fracture: Secondary | ICD-10-CM | POA: Diagnosis not present

## 2024-03-12 DIAGNOSIS — E7800 Pure hypercholesterolemia, unspecified: Secondary | ICD-10-CM | POA: Diagnosis not present

## 2024-03-12 DIAGNOSIS — Z483 Aftercare following surgery for neoplasm: Secondary | ICD-10-CM | POA: Diagnosis not present

## 2024-03-12 DIAGNOSIS — J449 Chronic obstructive pulmonary disease, unspecified: Secondary | ICD-10-CM | POA: Diagnosis not present

## 2024-03-12 DIAGNOSIS — Z86711 Personal history of pulmonary embolism: Secondary | ICD-10-CM | POA: Diagnosis not present

## 2024-03-12 DIAGNOSIS — J8410 Pulmonary fibrosis, unspecified: Secondary | ICD-10-CM | POA: Diagnosis not present

## 2024-03-12 DIAGNOSIS — I110 Hypertensive heart disease with heart failure: Secondary | ICD-10-CM | POA: Diagnosis not present

## 2024-03-12 DIAGNOSIS — K219 Gastro-esophageal reflux disease without esophagitis: Secondary | ICD-10-CM | POA: Diagnosis not present

## 2024-03-12 DIAGNOSIS — Z85118 Personal history of other malignant neoplasm of bronchus and lung: Secondary | ICD-10-CM | POA: Diagnosis not present

## 2024-03-12 DIAGNOSIS — M069 Rheumatoid arthritis, unspecified: Secondary | ICD-10-CM | POA: Diagnosis not present

## 2024-03-12 DIAGNOSIS — D414 Neoplasm of uncertain behavior of bladder: Secondary | ICD-10-CM | POA: Diagnosis not present

## 2024-03-17 DIAGNOSIS — M069 Rheumatoid arthritis, unspecified: Secondary | ICD-10-CM | POA: Diagnosis not present

## 2024-03-17 DIAGNOSIS — I5030 Unspecified diastolic (congestive) heart failure: Secondary | ICD-10-CM | POA: Diagnosis not present

## 2024-03-17 DIAGNOSIS — Z85118 Personal history of other malignant neoplasm of bronchus and lung: Secondary | ICD-10-CM | POA: Diagnosis not present

## 2024-03-17 DIAGNOSIS — Z9181 History of falling: Secondary | ICD-10-CM | POA: Diagnosis not present

## 2024-03-17 DIAGNOSIS — D414 Neoplasm of uncertain behavior of bladder: Secondary | ICD-10-CM | POA: Diagnosis not present

## 2024-03-17 DIAGNOSIS — M810 Age-related osteoporosis without current pathological fracture: Secondary | ICD-10-CM | POA: Diagnosis not present

## 2024-03-17 DIAGNOSIS — Z7901 Long term (current) use of anticoagulants: Secondary | ICD-10-CM | POA: Diagnosis not present

## 2024-03-17 DIAGNOSIS — S1181XD Laceration without foreign body of other specified part of neck, subsequent encounter: Secondary | ICD-10-CM | POA: Diagnosis not present

## 2024-03-17 DIAGNOSIS — K219 Gastro-esophageal reflux disease without esophagitis: Secondary | ICD-10-CM | POA: Diagnosis not present

## 2024-03-17 DIAGNOSIS — J8410 Pulmonary fibrosis, unspecified: Secondary | ICD-10-CM | POA: Diagnosis not present

## 2024-03-17 DIAGNOSIS — Z87891 Personal history of nicotine dependence: Secondary | ICD-10-CM | POA: Diagnosis not present

## 2024-03-17 DIAGNOSIS — Z86711 Personal history of pulmonary embolism: Secondary | ICD-10-CM | POA: Diagnosis not present

## 2024-03-17 DIAGNOSIS — Z483 Aftercare following surgery for neoplasm: Secondary | ICD-10-CM | POA: Diagnosis not present

## 2024-03-17 DIAGNOSIS — D630 Anemia in neoplastic disease: Secondary | ICD-10-CM | POA: Diagnosis not present

## 2024-03-17 DIAGNOSIS — E440 Moderate protein-calorie malnutrition: Secondary | ICD-10-CM | POA: Diagnosis not present

## 2024-03-17 DIAGNOSIS — E7800 Pure hypercholesterolemia, unspecified: Secondary | ICD-10-CM | POA: Diagnosis not present

## 2024-03-17 DIAGNOSIS — J449 Chronic obstructive pulmonary disease, unspecified: Secondary | ICD-10-CM | POA: Diagnosis not present

## 2024-03-17 DIAGNOSIS — M1990 Unspecified osteoarthritis, unspecified site: Secondary | ICD-10-CM | POA: Diagnosis not present

## 2024-03-17 DIAGNOSIS — I110 Hypertensive heart disease with heart failure: Secondary | ICD-10-CM | POA: Diagnosis not present

## 2024-03-17 DIAGNOSIS — Z87442 Personal history of urinary calculi: Secondary | ICD-10-CM | POA: Diagnosis not present

## 2024-03-18 ENCOUNTER — Encounter: Payer: Self-pay | Admitting: Family Medicine

## 2024-03-18 ENCOUNTER — Other Ambulatory Visit: Payer: Self-pay

## 2024-03-18 ENCOUNTER — Ambulatory Visit: Admitting: Family Medicine

## 2024-03-18 VITALS — BP 150/82 | HR 61 | Ht 62.0 in | Wt 104.0 lb

## 2024-03-18 DIAGNOSIS — G8929 Other chronic pain: Secondary | ICD-10-CM

## 2024-03-18 DIAGNOSIS — M25561 Pain in right knee: Secondary | ICD-10-CM | POA: Diagnosis not present

## 2024-03-18 DIAGNOSIS — C672 Malignant neoplasm of lateral wall of bladder: Secondary | ICD-10-CM | POA: Diagnosis not present

## 2024-03-18 DIAGNOSIS — M1711 Unilateral primary osteoarthritis, right knee: Secondary | ICD-10-CM | POA: Diagnosis not present

## 2024-03-18 NOTE — Patient Instructions (Addendum)
 Thank you for coming in today.   You received an injection today. Seek immediate medical attention if the joint becomes red, extremely painful, or is oozing fluid.   You should hear from Dr. Kathlene Cote office.

## 2024-03-18 NOTE — Progress Notes (Unsigned)
 I, Dana [name], CMA acting as a scribe for Dana Graham, MD.  KEYUNDRA [name] is a 89 y.o. female who presents to [facility] at [facility] today for cont'd R knee pain and Zilretta injection. Pt was last seen by Dr. Denyse Amass on 05/16/23 and was given a repeat R knee Zilretta injection.  She was also referred for a GAE, done on 07/09/23.  Unfortunately due to her vascular disease the genicular artery embolization was unable to be completed.  Zilretta did not help much.  Today, pt reports continued knee pain. Notes no relief with last injection, has been dealing with the pain since then. Had eval at the [facility] and was provided with knee brace, which does have with stability and confidence while ambulating. Continues to have difficulty with lateral movements. Denies swelling. Ambulating with a walker today. Here today with daughter-in-law.   Patient and her daughter both note that she is not a surgical candidate for total knee replacement.   Dx imaging: 12/21/19 R knee XR   Pertinent review of systems: No fevers or chills  Relevant historical information: Lung cancer diagnosed 2012.   Exam:  BP (!) 150/82   Pulse 61   Ht 5\' 2"  (1.575 m)   Wt 104 lb (47.2 kg)   SpO2 95%   BMI 19.02 kg/m  General: Well Developed, well nourished, and in no acute distress.   MSK: Right knee moderate effusion genu valgus normal motion with crepitation.    Lab and Radiology Results  Procedure: Real-time Ultrasound Guided Injection of right knee joint superior lateral patella space Device: Philips Affiniti 50G/GE Logiq Images permanently stored and available for review in PACS Verbal informed consent obtained.  Discussed risks and benefits of procedure. Warned about infection, bleeding, hyperglycemia damage to structures among others. Patient expresses understanding and agreement Time-out conducted.   Noted no overlying erythema, induration, or other signs of local infection.    Skin prepped in a sterile fashion.   Local anesthesia: Topical Ethyl chloride.   With sterile technique and under real time ultrasound guidance: 40 mg of Kenalog and 2 mL of Marcaine injected into knee joint. Fluid seen entering the joint capsule.   Completed without difficulty   Pain immediately resolved suggesting accurate placement of the medication.   Advised to call if fevers/chills, erythema, induration, drainage, or persistent bleeding.   Images permanently stored and available for review in the ultrasound unit.  Impression: Technically successful ultrasound guided injection.      Assessment and Plan: 89 y.o. female with severe right knee DJD.  We are running out of options for Cyndie.  All conservative measures so far have either not been effective or been unable to be completed.  I did perform a steroid injection in her right knee but I do not expect that the last very long.  She unfortunately was unable to have a genicular artery embolization.  At this point her only remaining option for chronic knee pain management is genicular nerve ablation.  Will refer to pain management to get this completed.  I did warn the family that this is generally less effective but when it works could be very helpful.  We could potentially use chronic opiates for her chronic pain but that is less desirable.   PDMP not reviewed this encounter. Orders Placed This Encounter  Procedures   [place] LIMITED JOINT SPACE STRUCTURES LOW RIGHT(NO LINKED CHARGES)    Reason for Exam (SYMPTOM  OR DIAGNOSIS REQUIRED):   right  knee pain    Preferred imaging location?:    [facility] referral to Pain Clinic    Referral Priority:   Routine    Referral Type:   Consultation    Referral Reason:   Specialty Services Required    Requested Specialty:   Pain Medicine    Number of Visits Requested:   1   No orders of the defined types were placed in this encounter.    Discussed  warning signs or symptoms. Please see discharge instructions. Patient expresses understanding.   The above documentation has been reviewed and is accurate and complete Dana [name], M.D.

## 2024-03-19 DIAGNOSIS — M1711 Unilateral primary osteoarthritis, right knee: Secondary | ICD-10-CM | POA: Insufficient documentation

## 2024-03-19 DIAGNOSIS — G8929 Other chronic pain: Secondary | ICD-10-CM | POA: Insufficient documentation

## 2024-03-22 ENCOUNTER — Other Ambulatory Visit: Payer: Self-pay | Admitting: Physician Assistant

## 2024-03-22 DIAGNOSIS — R634 Abnormal weight loss: Secondary | ICD-10-CM

## 2024-03-24 DIAGNOSIS — D414 Neoplasm of uncertain behavior of bladder: Secondary | ICD-10-CM | POA: Diagnosis not present

## 2024-03-24 DIAGNOSIS — D630 Anemia in neoplastic disease: Secondary | ICD-10-CM | POA: Diagnosis not present

## 2024-03-24 DIAGNOSIS — Z85118 Personal history of other malignant neoplasm of bronchus and lung: Secondary | ICD-10-CM | POA: Diagnosis not present

## 2024-03-24 DIAGNOSIS — M1990 Unspecified osteoarthritis, unspecified site: Secondary | ICD-10-CM | POA: Diagnosis not present

## 2024-03-24 DIAGNOSIS — K219 Gastro-esophageal reflux disease without esophagitis: Secondary | ICD-10-CM | POA: Diagnosis not present

## 2024-03-24 DIAGNOSIS — Z9181 History of falling: Secondary | ICD-10-CM | POA: Diagnosis not present

## 2024-03-24 DIAGNOSIS — J449 Chronic obstructive pulmonary disease, unspecified: Secondary | ICD-10-CM | POA: Diagnosis not present

## 2024-03-24 DIAGNOSIS — Z483 Aftercare following surgery for neoplasm: Secondary | ICD-10-CM | POA: Diagnosis not present

## 2024-03-24 DIAGNOSIS — Z87442 Personal history of urinary calculi: Secondary | ICD-10-CM | POA: Diagnosis not present

## 2024-03-24 DIAGNOSIS — Z87891 Personal history of nicotine dependence: Secondary | ICD-10-CM | POA: Diagnosis not present

## 2024-03-24 DIAGNOSIS — Z7901 Long term (current) use of anticoagulants: Secondary | ICD-10-CM | POA: Diagnosis not present

## 2024-03-24 DIAGNOSIS — S1181XD Laceration without foreign body of other specified part of neck, subsequent encounter: Secondary | ICD-10-CM | POA: Diagnosis not present

## 2024-03-24 DIAGNOSIS — M810 Age-related osteoporosis without current pathological fracture: Secondary | ICD-10-CM | POA: Diagnosis not present

## 2024-03-24 DIAGNOSIS — Z86711 Personal history of pulmonary embolism: Secondary | ICD-10-CM | POA: Diagnosis not present

## 2024-03-24 DIAGNOSIS — J8410 Pulmonary fibrosis, unspecified: Secondary | ICD-10-CM | POA: Diagnosis not present

## 2024-03-24 DIAGNOSIS — M069 Rheumatoid arthritis, unspecified: Secondary | ICD-10-CM | POA: Diagnosis not present

## 2024-03-24 DIAGNOSIS — I110 Hypertensive heart disease with heart failure: Secondary | ICD-10-CM | POA: Diagnosis not present

## 2024-03-24 DIAGNOSIS — I5030 Unspecified diastolic (congestive) heart failure: Secondary | ICD-10-CM | POA: Diagnosis not present

## 2024-03-24 DIAGNOSIS — E440 Moderate protein-calorie malnutrition: Secondary | ICD-10-CM | POA: Diagnosis not present

## 2024-03-24 DIAGNOSIS — E7800 Pure hypercholesterolemia, unspecified: Secondary | ICD-10-CM | POA: Diagnosis not present

## 2024-03-25 DIAGNOSIS — C672 Malignant neoplasm of lateral wall of bladder: Secondary | ICD-10-CM | POA: Diagnosis not present

## 2024-03-26 DIAGNOSIS — H524 Presbyopia: Secondary | ICD-10-CM | POA: Diagnosis not present

## 2024-03-26 DIAGNOSIS — H2511 Age-related nuclear cataract, right eye: Secondary | ICD-10-CM | POA: Diagnosis not present

## 2024-03-26 DIAGNOSIS — H5203 Hypermetropia, bilateral: Secondary | ICD-10-CM | POA: Diagnosis not present

## 2024-03-26 DIAGNOSIS — Z79899 Other long term (current) drug therapy: Secondary | ICD-10-CM | POA: Diagnosis not present

## 2024-03-26 DIAGNOSIS — H04123 Dry eye syndrome of bilateral lacrimal glands: Secondary | ICD-10-CM | POA: Diagnosis not present

## 2024-03-26 DIAGNOSIS — H35361 Drusen (degenerative) of macula, right eye: Secondary | ICD-10-CM | POA: Diagnosis not present

## 2024-03-26 DIAGNOSIS — H52223 Regular astigmatism, bilateral: Secondary | ICD-10-CM | POA: Diagnosis not present

## 2024-03-27 DIAGNOSIS — Z681 Body mass index (BMI) 19 or less, adult: Secondary | ICD-10-CM | POA: Diagnosis not present

## 2024-03-27 DIAGNOSIS — K5900 Constipation, unspecified: Secondary | ICD-10-CM | POA: Diagnosis not present

## 2024-04-01 DIAGNOSIS — C672 Malignant neoplasm of lateral wall of bladder: Secondary | ICD-10-CM | POA: Diagnosis not present

## 2024-04-02 DIAGNOSIS — M810 Age-related osteoporosis without current pathological fracture: Secondary | ICD-10-CM | POA: Diagnosis not present

## 2024-04-02 DIAGNOSIS — E440 Moderate protein-calorie malnutrition: Secondary | ICD-10-CM | POA: Diagnosis not present

## 2024-04-02 DIAGNOSIS — Z86711 Personal history of pulmonary embolism: Secondary | ICD-10-CM | POA: Diagnosis not present

## 2024-04-02 DIAGNOSIS — E7800 Pure hypercholesterolemia, unspecified: Secondary | ICD-10-CM | POA: Diagnosis not present

## 2024-04-02 DIAGNOSIS — J449 Chronic obstructive pulmonary disease, unspecified: Secondary | ICD-10-CM | POA: Diagnosis not present

## 2024-04-02 DIAGNOSIS — D414 Neoplasm of uncertain behavior of bladder: Secondary | ICD-10-CM | POA: Diagnosis not present

## 2024-04-02 DIAGNOSIS — M069 Rheumatoid arthritis, unspecified: Secondary | ICD-10-CM | POA: Diagnosis not present

## 2024-04-02 DIAGNOSIS — D630 Anemia in neoplastic disease: Secondary | ICD-10-CM | POA: Diagnosis not present

## 2024-04-02 DIAGNOSIS — Z87442 Personal history of urinary calculi: Secondary | ICD-10-CM | POA: Diagnosis not present

## 2024-04-02 DIAGNOSIS — I110 Hypertensive heart disease with heart failure: Secondary | ICD-10-CM | POA: Diagnosis not present

## 2024-04-02 DIAGNOSIS — J8410 Pulmonary fibrosis, unspecified: Secondary | ICD-10-CM | POA: Diagnosis not present

## 2024-04-02 DIAGNOSIS — I5030 Unspecified diastolic (congestive) heart failure: Secondary | ICD-10-CM | POA: Diagnosis not present

## 2024-04-02 DIAGNOSIS — Z483 Aftercare following surgery for neoplasm: Secondary | ICD-10-CM | POA: Diagnosis not present

## 2024-04-02 DIAGNOSIS — Z9181 History of falling: Secondary | ICD-10-CM | POA: Diagnosis not present

## 2024-04-02 DIAGNOSIS — M1990 Unspecified osteoarthritis, unspecified site: Secondary | ICD-10-CM | POA: Diagnosis not present

## 2024-04-02 DIAGNOSIS — S1181XD Laceration without foreign body of other specified part of neck, subsequent encounter: Secondary | ICD-10-CM | POA: Diagnosis not present

## 2024-04-02 DIAGNOSIS — Z87891 Personal history of nicotine dependence: Secondary | ICD-10-CM | POA: Diagnosis not present

## 2024-04-02 DIAGNOSIS — Z7901 Long term (current) use of anticoagulants: Secondary | ICD-10-CM | POA: Diagnosis not present

## 2024-04-02 DIAGNOSIS — Z85118 Personal history of other malignant neoplasm of bronchus and lung: Secondary | ICD-10-CM | POA: Diagnosis not present

## 2024-04-02 DIAGNOSIS — K219 Gastro-esophageal reflux disease without esophagitis: Secondary | ICD-10-CM | POA: Diagnosis not present

## 2024-04-03 DIAGNOSIS — L989 Disorder of the skin and subcutaneous tissue, unspecified: Secondary | ICD-10-CM | POA: Diagnosis not present

## 2024-04-08 ENCOUNTER — Other Ambulatory Visit: Payer: Self-pay

## 2024-04-08 ENCOUNTER — Other Ambulatory Visit (HOSPITAL_COMMUNITY): Payer: Self-pay

## 2024-04-08 NOTE — Progress Notes (Signed)
 Specialty Pharmacy Refill Coordination Note  Dana [name] is a 89 y.o. female contacted today regarding refills of specialty medication(s) Tarceva.  Patient requested (Patient-Rptd) Delivery   Delivery date: (Patient-Rptd) 04/22/24   Verified address: (Patient-Rptd) [address] [place], [place], [place] [address]   Medication will be filled on 04/21/24.

## 2024-04-10 ENCOUNTER — Other Ambulatory Visit: Payer: Self-pay

## 2024-04-15 ENCOUNTER — Encounter: Payer: Self-pay | Admitting: Physical Medicine & Rehabilitation

## 2024-04-16 DIAGNOSIS — D414 Neoplasm of uncertain behavior of bladder: Secondary | ICD-10-CM | POA: Diagnosis not present

## 2024-04-16 DIAGNOSIS — Z87442 Personal history of urinary calculi: Secondary | ICD-10-CM | POA: Diagnosis not present

## 2024-04-16 DIAGNOSIS — I5030 Unspecified diastolic (congestive) heart failure: Secondary | ICD-10-CM | POA: Diagnosis not present

## 2024-04-16 DIAGNOSIS — Z86711 Personal history of pulmonary embolism: Secondary | ICD-10-CM | POA: Diagnosis not present

## 2024-04-16 DIAGNOSIS — J449 Chronic obstructive pulmonary disease, unspecified: Secondary | ICD-10-CM | POA: Diagnosis not present

## 2024-04-16 DIAGNOSIS — D630 Anemia in neoplastic disease: Secondary | ICD-10-CM | POA: Diagnosis not present

## 2024-04-16 DIAGNOSIS — Z9181 History of falling: Secondary | ICD-10-CM | POA: Diagnosis not present

## 2024-04-16 DIAGNOSIS — M069 Rheumatoid arthritis, unspecified: Secondary | ICD-10-CM | POA: Diagnosis not present

## 2024-04-16 DIAGNOSIS — E440 Moderate protein-calorie malnutrition: Secondary | ICD-10-CM | POA: Diagnosis not present

## 2024-04-16 DIAGNOSIS — M1990 Unspecified osteoarthritis, unspecified site: Secondary | ICD-10-CM | POA: Diagnosis not present

## 2024-04-16 DIAGNOSIS — Z85118 Personal history of other malignant neoplasm of bronchus and lung: Secondary | ICD-10-CM | POA: Diagnosis not present

## 2024-04-16 DIAGNOSIS — J8410 Pulmonary fibrosis, unspecified: Secondary | ICD-10-CM | POA: Diagnosis not present

## 2024-04-16 DIAGNOSIS — S1181XD Laceration without foreign body of other specified part of neck, subsequent encounter: Secondary | ICD-10-CM | POA: Diagnosis not present

## 2024-04-16 DIAGNOSIS — I110 Hypertensive heart disease with heart failure: Secondary | ICD-10-CM | POA: Diagnosis not present

## 2024-04-16 DIAGNOSIS — M810 Age-related osteoporosis without current pathological fracture: Secondary | ICD-10-CM | POA: Diagnosis not present

## 2024-04-16 DIAGNOSIS — K219 Gastro-esophageal reflux disease without esophagitis: Secondary | ICD-10-CM | POA: Diagnosis not present

## 2024-04-16 DIAGNOSIS — Z87891 Personal history of nicotine dependence: Secondary | ICD-10-CM | POA: Diagnosis not present

## 2024-04-16 DIAGNOSIS — Z483 Aftercare following surgery for neoplasm: Secondary | ICD-10-CM | POA: Diagnosis not present

## 2024-04-16 DIAGNOSIS — Z7901 Long term (current) use of anticoagulants: Secondary | ICD-10-CM | POA: Diagnosis not present

## 2024-04-16 DIAGNOSIS — E7800 Pure hypercholesterolemia, unspecified: Secondary | ICD-10-CM | POA: Diagnosis not present

## 2024-04-18 ENCOUNTER — Other Ambulatory Visit: Payer: Self-pay | Admitting: Physician Assistant

## 2024-04-18 DIAGNOSIS — R634 Abnormal weight loss: Secondary | ICD-10-CM

## 2024-04-21 ENCOUNTER — Other Ambulatory Visit: Payer: Self-pay

## 2024-04-21 DIAGNOSIS — J449 Chronic obstructive pulmonary disease, unspecified: Secondary | ICD-10-CM | POA: Diagnosis not present

## 2024-04-21 DIAGNOSIS — Z483 Aftercare following surgery for neoplasm: Secondary | ICD-10-CM | POA: Diagnosis not present

## 2024-04-21 DIAGNOSIS — D630 Anemia in neoplastic disease: Secondary | ICD-10-CM | POA: Diagnosis not present

## 2024-04-21 DIAGNOSIS — M069 Rheumatoid arthritis, unspecified: Secondary | ICD-10-CM | POA: Diagnosis not present

## 2024-04-21 DIAGNOSIS — M1990 Unspecified osteoarthritis, unspecified site: Secondary | ICD-10-CM | POA: Diagnosis not present

## 2024-04-21 DIAGNOSIS — Z9181 History of falling: Secondary | ICD-10-CM | POA: Diagnosis not present

## 2024-04-21 DIAGNOSIS — Z86711 Personal history of pulmonary embolism: Secondary | ICD-10-CM | POA: Diagnosis not present

## 2024-04-21 DIAGNOSIS — I110 Hypertensive heart disease with heart failure: Secondary | ICD-10-CM | POA: Diagnosis not present

## 2024-04-21 DIAGNOSIS — Z7901 Long term (current) use of anticoagulants: Secondary | ICD-10-CM | POA: Diagnosis not present

## 2024-04-21 DIAGNOSIS — J8410 Pulmonary fibrosis, unspecified: Secondary | ICD-10-CM | POA: Diagnosis not present

## 2024-04-21 DIAGNOSIS — D414 Neoplasm of uncertain behavior of bladder: Secondary | ICD-10-CM | POA: Diagnosis not present

## 2024-04-21 DIAGNOSIS — I5030 Unspecified diastolic (congestive) heart failure: Secondary | ICD-10-CM | POA: Diagnosis not present

## 2024-04-21 DIAGNOSIS — Z85118 Personal history of other malignant neoplasm of bronchus and lung: Secondary | ICD-10-CM | POA: Diagnosis not present

## 2024-04-21 DIAGNOSIS — S1181XD Laceration without foreign body of other specified part of neck, subsequent encounter: Secondary | ICD-10-CM | POA: Diagnosis not present

## 2024-04-21 DIAGNOSIS — Z87442 Personal history of urinary calculi: Secondary | ICD-10-CM | POA: Diagnosis not present

## 2024-04-21 DIAGNOSIS — M810 Age-related osteoporosis without current pathological fracture: Secondary | ICD-10-CM | POA: Diagnosis not present

## 2024-04-21 DIAGNOSIS — E440 Moderate protein-calorie malnutrition: Secondary | ICD-10-CM | POA: Diagnosis not present

## 2024-04-21 DIAGNOSIS — Z87891 Personal history of nicotine dependence: Secondary | ICD-10-CM | POA: Diagnosis not present

## 2024-04-21 DIAGNOSIS — E7800 Pure hypercholesterolemia, unspecified: Secondary | ICD-10-CM | POA: Diagnosis not present

## 2024-04-21 DIAGNOSIS — K219 Gastro-esophageal reflux disease without esophagitis: Secondary | ICD-10-CM | POA: Diagnosis not present

## 2024-04-23 ENCOUNTER — Inpatient Hospital Stay: Payer: Medicare Other | Attending: Internal Medicine | Admitting: Internal Medicine

## 2024-04-23 ENCOUNTER — Inpatient Hospital Stay: Payer: Medicare Other

## 2024-04-23 VITALS — BP 131/75 | HR 105 | Temp 98.6°F | Resp 16 | Ht 62.0 in | Wt 104.8 lb

## 2024-04-23 DIAGNOSIS — K219 Gastro-esophageal reflux disease without esophagitis: Secondary | ICD-10-CM | POA: Insufficient documentation

## 2024-04-23 DIAGNOSIS — Z87442 Personal history of urinary calculi: Secondary | ICD-10-CM | POA: Diagnosis not present

## 2024-04-23 DIAGNOSIS — Z9221 Personal history of antineoplastic chemotherapy: Secondary | ICD-10-CM | POA: Insufficient documentation

## 2024-04-23 DIAGNOSIS — J449 Chronic obstructive pulmonary disease, unspecified: Secondary | ICD-10-CM | POA: Diagnosis not present

## 2024-04-23 DIAGNOSIS — I110 Hypertensive heart disease with heart failure: Secondary | ICD-10-CM | POA: Insufficient documentation

## 2024-04-23 DIAGNOSIS — M25561 Pain in right knee: Secondary | ICD-10-CM | POA: Diagnosis not present

## 2024-04-23 DIAGNOSIS — Z79899 Other long term (current) drug therapy: Secondary | ICD-10-CM | POA: Diagnosis not present

## 2024-04-23 DIAGNOSIS — R262 Difficulty in walking, not elsewhere classified: Secondary | ICD-10-CM | POA: Insufficient documentation

## 2024-04-23 DIAGNOSIS — Z1509 Genetic susceptibility to other malignant neoplasm: Secondary | ICD-10-CM | POA: Diagnosis not present

## 2024-04-23 DIAGNOSIS — M810 Age-related osteoporosis without current pathological fracture: Secondary | ICD-10-CM | POA: Insufficient documentation

## 2024-04-23 DIAGNOSIS — C3490 Malignant neoplasm of unspecified part of unspecified bronchus or lung: Secondary | ICD-10-CM

## 2024-04-23 DIAGNOSIS — D649 Anemia, unspecified: Secondary | ICD-10-CM | POA: Diagnosis not present

## 2024-04-23 DIAGNOSIS — Z923 Personal history of irradiation: Secondary | ICD-10-CM | POA: Insufficient documentation

## 2024-04-23 DIAGNOSIS — Z7901 Long term (current) use of anticoagulants: Secondary | ICD-10-CM | POA: Insufficient documentation

## 2024-04-23 DIAGNOSIS — Z86711 Personal history of pulmonary embolism: Secondary | ICD-10-CM | POA: Insufficient documentation

## 2024-04-23 DIAGNOSIS — C679 Malignant neoplasm of bladder, unspecified: Secondary | ICD-10-CM | POA: Insufficient documentation

## 2024-04-23 DIAGNOSIS — C3491 Malignant neoplasm of unspecified part of right bronchus or lung: Secondary | ICD-10-CM

## 2024-04-23 LAB — CBC WITH DIFFERENTIAL (CANCER CENTER ONLY)
Abs Immature Granulocytes: 0.02 10*3/uL (ref 0.00–0.07)
Basophils Absolute: 0 10*3/uL (ref 0.0–0.1)
Basophils Relative: 0 %
Eosinophils Absolute: 0.2 10*3/uL (ref 0.0–0.5)
Eosinophils Relative: 4 %
HCT: 34.1 % — ABNORMAL LOW (ref 36.0–46.0)
Hemoglobin: 11.3 g/dL — ABNORMAL LOW (ref 12.0–15.0)
Immature Granulocytes: 0 %
Lymphocytes Relative: 12 %
Lymphs Abs: 0.8 10*3/uL (ref 0.7–4.0)
MCH: 29 pg (ref 26.0–34.0)
MCHC: 33.1 g/dL (ref 30.0–36.0)
MCV: 87.7 fL (ref 80.0–100.0)
Monocytes Absolute: 0.7 10*3/uL (ref 0.1–1.0)
Monocytes Relative: 11 %
Neutro Abs: 4.6 10*3/uL (ref 1.7–7.7)
Neutrophils Relative %: 73 %
Platelet Count: 336 10*3/uL (ref 150–400)
RBC: 3.89 MIL/uL (ref 3.87–5.11)
RDW: 17.6 % — ABNORMAL HIGH (ref 11.5–15.5)
WBC Count: 6.4 10*3/uL (ref 4.0–10.5)
nRBC: 0 % (ref 0.0–0.2)

## 2024-04-23 LAB — CMP (CANCER CENTER ONLY)
ALT: 26 U/L (ref 0–44)
AST: 29 U/L (ref 15–41)
Albumin: 3.6 g/dL (ref 3.5–5.0)
Alkaline Phosphatase: 62 U/L (ref 38–126)
Anion gap: 4 — ABNORMAL LOW (ref 5–15)
BUN: 37 mg/dL — ABNORMAL HIGH (ref 8–23)
CO2: 32 mmol/L (ref 22–32)
Calcium: 9.9 mg/dL (ref 8.9–10.3)
Chloride: 105 mmol/L (ref 98–111)
Creatinine: 0.88 mg/dL (ref 0.44–1.00)
GFR, Estimated: >60 mL/min (ref >60)
Glucose, Bld: 108 mg/dL — ABNORMAL HIGH (ref 70–99)
Potassium: 3.8 mmol/L (ref 3.5–5.1)
Sodium: 141 mmol/L (ref 135–145)
Total Bilirubin: 0.5 mg/dL (ref 0.0–1.2)
Total Protein: 6 g/dL — ABNORMAL LOW (ref 6.5–8.1)

## 2024-04-23 NOTE — Progress Notes (Signed)
 [facility] Telephone:([phone]) [phone]   Fax:([phone]) [phone]  OFFICE PROGRESS NOTE  Dana Lobstein, MD [address] [place] [address]  DIAGNOSIS: Stage IIIB (T2a, N3, M0) non-small cell lung cancer, adenocarcinoma with positive EGFR mutation diagnosed in January 2010.  PRIOR THERAPY: 1) Status post concurrent chemoradiation with weekly carboplatin and paclitaxel; last dose given March 21, 2009.  2) Tarceva  at 150 mg p.o. daily, status post approximately 48 months of treatment, discontinued secondary to persistent diarrhea.  3) status post right Pleurx catheter placement for right nonmalignant pleural effusion.  CURRENT THERAPY: Tarceva  100 mg by mouth daily started 03/19/2013, status post 133 months of treatment.  INTERVAL HISTORY: Dana [name] 89 y.o. female returns to the clinic today for follow-up visit accompanied by her daughter-in-law, Marily Shows.  Discussed the use of AI scribe software for clinical note transcription with the patient, who gave verbal consent to proceed.  History of Present Illness   Dana [name] is an 89 year old female with stage III B non-small cell lung cancer who presents for evaluation and repeat blood work. She is accompanied by her daughter-in-law, Marily Shows.  She has been on Tarceva  100 mg PO daily since March 2014 for her stage III B non-small cell lung cancer. Previously, she underwent concurrent chemoradiation and was on Tarceva  150 mg PO daily for four years. She has experienced no issues with Tarceva , including no diarrhea, and has been on this medication for almost 15 years.  She experiences significant fatigue and difficulty walking due to her right knee, which has been in a cast for a year. Various treatments, including injections, have provided temporary relief, but a recent procedure was not possible due to shrunken arteries. She is scheduled for a nerve block consultation on June 20th. She cannot put much weight on her right  knee and has been using a brace.  She has a history of superficial bladder cancer and underwent BCG treatment, completing six treatments. A follow-up cystoscopy is scheduled for May 28th.  Recent blood work shows mild anemia with a hemoglobin level of 11.3, an improvement from a previous level of 10.6. Her blood sugar is slightly elevated at 108, and her total protein is 6.0, up from 5.5. She has maintained a weight of 104 pounds, up from a previous low of 97 pounds.  She uses Stiletto for breathing issues and reports variability in her breathing, with some days being better than others.       MEDICAL HISTORY: Past Medical History:  Diagnosis Date   Anemia    Arthritis    HANDS,  WRISTS   CAP (community acquired pneumonia) 12/25/2022   CHF (congestive heart failure) (HCC)    COPD GOLD 0/ lama/laba responsive 07/11/2019   Quit smoking 1979  07/05/2016  Walked RA x 3 laps @ 185 ft each stopped due to  End of study, nl pace no desat or sob   - Spirometry 07/05/2016  wnl p am spiriva    - CTa chest 07/05/2016 neg pe/ neg ILD, R infrahilar density c/w lung ca vs post RT scar    Chest CT w contrast  09/12/18  Stable CT of the chest  - 11/25/2018   Walked RA  2 laps @ 28ft each @ moderate pace  stopped due to  desats to 86% c   Depression 04/04/2017   Dyspnea    Dyspnea on exertion    Encounter for therapeutic drug monitoring 10/01/2016   GERD (gastroesophageal  reflux disease)    at times   Heart failure with preserved left ventricular function (HFpEF) (HCC) 11/24/2023   Hemorrhoid    History of anal fissures    History of kidney stones    History of lung cancer ONCOLOGIST--  DR Marguerita Shih--  LAST CT ,  NO RECURRENCE OR METS   DX JAN 2010 --  STAGE IIIA  NON-SMALL CELL ADENOCARCINOMA (RIGHT MIDDLE LOBE)---  S/P  CHEMORADIATIO THERAPY  (COMPLETE 03-21-2009)   History of pulmonary embolism    HTN (hypertension) 10/10/2017   Imbalance 06/04/2017   lung ca dx'd 2010   Normal cardiac stress test     2007  PER PT   Osteoporosis    PONV (postoperative nausea and vomiting)    Rash, skin    RIGHT FOREARM/ HAND   Right wrist pain    MASS   Wears glasses     ALLERGIES:  is allergic to methotrexate, clindamycin  hcl, lactose intolerance (gi), amoxicillin, and protonix  [pantoprazole  sodium].  MEDICATIONS:  Current Outpatient Medications  Medication Sig Dispense Refill   acetaminophen  (TYLENOL ) 650 MG CR tablet Take 650 mg by mouth in the morning.     AMBULATORY NON FORMULARY MEDICATION R knee brace Dispense 1 Dx code: M17.11 Use as needed 1 Device 0   apixaban  (ELIQUIS ) 5 MG TABS tablet Take 2 tablets (10 mg total) by mouth 2 (two) times daily for 5 days, THEN 1 tablet (5 mg total) 2 (two) times daily for 25 days. (Patient taking differently: Take 1 tablet (5 mg total) 2 (two) times daily.) 70 tablet 0   b complex vitamins capsule Take 1 capsule by mouth in the morning.     bismuth subsalicylate (PEPTO BISMOL) 262 MG chewable tablet Chew 262 mg by mouth as needed for indigestion or diarrhea or loose stools.     Calcium  Carbonate-Vit D-Min (CALCIUM  600+D PLUS MINERALS) 600-400 MG-UNIT TABS Take 1 tablet by mouth 2 (two) times daily.     Cholecalciferol  (VITAMIN D ) 50 MCG (2000 UT) tablet Take 2,000 Units by mouth in the morning.     desonide (DESOWEN) 0.05 % ointment Apply 1 Application topically 2 (two) times daily as needed (scaly skin).     erlotinib  (TARCEVA ) 100 MG tablet Take 1 tablet (100 mg total) by mouth daily. 30 tablet 3   hydrocortisone 2.5 % cream Apply 1 application  topically every other day.     hyoscyamine  (ANASPAZ ) 0.125 MG TBDP disintergrating tablet Place 1 tablet (0.125 mg total) under the tongue every 4 (four) hours as needed for up to 8 doses for bladder spasms. 8 tablet 0   lactase (LACTAID) 3000 units tablet Take 3,000 Units by mouth daily as needed (when eating dairy products).     loperamide (IMODIUM A-D) 2 MG tablet Take 2 mg by mouth as needed for diarrhea or  loose stools.     loratadine  (CLARITIN ) 10 MG tablet Take 10 mg by mouth daily as needed for allergies.     mirtazapine  (REMERON ) 15 MG tablet TAKE 1 TABLET BY MOUTH AT BEDTIME 30 tablet 0   Multiple Vitamins-Minerals (HAIR SKIN AND NAILS FORMULA) TABS Take 1 tablet by mouth daily.     Multiple Vitamins-Minerals (PRESERVISION AREDS 2 PO) Take 1 tablet by mouth 2 (two) times daily.     phenazopyridine  (PYRIDIUM ) 200 MG tablet Take 1 tablet (200 mg total) by mouth 3 (three) times daily as needed for up to 6 doses. 6 tablet 0   Polyethyl Glycol-Propyl Glycol (SYSTANE  OP) Place 1 drop into both eyes in the morning and at bedtime.     Probiotic Product (PROBIOTIC ACIDOPHILUS) CHEW Chew 1 tablet by mouth in the morning.     tacrolimus (PROGRAF) 1 MG capsule 1 mg See admin instructions. Dissolve 1 mg capsule in 1/2 liter of water  soaking a cotton ball and apply to wound twice daily     Tiotropium Bromide -Olodaterol (STIOLTO RESPIMAT ) 2.5-2.5 MCG/ACT AERS INHALE 2 PUFFS BY MOUTH ONCE DAILY 4 g 0   No current facility-administered medications for this visit.    SURGICAL HISTORY:  Past Surgical History:  Procedure Laterality Date   ANAL FISSURE REPAIR  07/09/2011   INTERNAL SPHINCTEROTOMY   BENIGN EXCISION LEFT BREAST CENTRAL DUCT  02/1999   BREAST BIOPSY  01/31/2009   benign   BREAST EXCISIONAL BIOPSY Left 2004   benign   CHEST TUBE INSERTION Right 07/14/2014   Procedure: INSERTION PLEURAL DRAINAGE CATHETER RIGHT CHEST;  Surgeon: Norita Beauvais, MD;  Location: [facility] OR;  Service: Thoracic;  Laterality: Right;   DIRECT LARYNGOSCOPY WITH RADIAESSE INJECTION Bilateral 08/05/2023   Procedure: SUSPENDED MIRCO DIRECT LARYNGOSCOPY WITH PROLARYN INJECTION;  Surgeon: Virgina Grills, MD;  Location: [facility] OR;  Service: ENT;  Laterality: Bilateral;   EXCISION RIGHT WRIST MASS  2012   EXTRACORPOREAL SHOCK WAVE LITHOTRIPSY Left 06/01/2019   Procedure: EXTRACORPOREAL SHOCK WAVE LITHOTRIPSY (ESWL);  Surgeon:  Marco Severs, MD;  Location: [facility] ORS;  Service: Urology;  Laterality: Left;   EYE SURGERY Bilateral    congenital "spot removed and also cataract removed   IR ANGIOGRAM EXTREMITY RIGHT  07/09/2023   IR ANGIOGRAM SELECTIVE EACH ADDITIONAL VESSEL  07/09/2023   IR RADIOLOGIST EVAL & MGMT  05/28/2023   IR US  GUIDE VASC ACCESS RIGHT  07/09/2023   KNEE ARTHROSCOPY Right 1999   MASS EXCISION Right 03/11/2014   Procedure: RIGHT WRIST DEEP MASS EXCISION WITH CULTURE AND BIOSPY;  Surgeon: Shellie Dials, MD;  Location: [facility];  Service: Orthopedics;  Laterality: Right;   MICROLARYNGOSCOPY W/VOCAL CORD INJECTION Bilateral 02/08/2021   Procedure: MICROLARYNGOSCOPY WITH VOCAL CORD INJECTION;  Surgeon: Virgina Grills, MD;  Location: [facility] OR;  Service: ENT;  Laterality: Bilateral;   REMOVAL OF PLEURAL DRAINAGE CATHETER Right 10/14/2014   Procedure: REMOVAL OF PLEURAL DRAINAGE CATHETER;  Surgeon: Norita Beauvais, MD;  Location: [facility] OR;  Service: Thoracic;  Laterality: Right;   TALC  PLEURODESIS Right 09/16/2014   Procedure: TALC  PLEURADESIS/ slurry;  Surgeon: Norita Beauvais, MD;  Location: [facility] OR;  Service: Thoracic;  Laterality: Right;   THORACOSCOPY  01/26/2009   w/   lung/  node biopsy's   THUMB ARTHROSCOPY Left    - removed bone spur   TONSILLECTOMY  AS CHILD   TOTAL ABDOMINAL HYSTERECTOMY W/ BILATERAL SALPINGOOPHORECTOMY  1982   W/  APPENDECTOMY   TRANSTHORACIC ECHOCARDIOGRAM  12/23/2008   NORMAL LV/  EF 65-70%/  MILD MR  &  TR   VAULT SUSPENSION PLUS CYSTOCELE REPAIR WITH GRAFT  06/13/2010    REVIEW OF SYSTEMS:  Constitutional: positive for fatigue Eyes: negative Ears, nose, mouth, throat, and face: negative Respiratory: positive for dyspnea on exertion Cardiovascular: negative Gastrointestinal: negative Genitourinary:negative Integument/breast: negative Hematologic/lymphatic: negative Musculoskeletal:positive for arthralgias Neurological:  negative Behavioral/Psych: negative Endocrine: negative Allergic/Immunologic: negative   PHYSICAL EXAMINATION: General appearance: alert, cooperative, fatigued, and no distress Head: Normocephalic, without obvious abnormality, atraumatic Neck: no adenopathy, no JVD, supple, symmetrical, trachea midline, and thyroid  not enlarged, symmetric, no tenderness/mass/nodules Lymph nodes:  Cervical, supraclavicular, and axillary nodes normal. Resp: clear to auscultation bilaterally Back: symmetric, no curvature. ROM normal. No CVA tenderness. Cardio: regular rate and rhythm, S1, S2 normal, no murmur, click, rub or gallop GI: soft, non-tender; bowel sounds normal; no masses,  no organomegaly Extremities: extremities normal, atraumatic, no cyanosis or edema Neurologic: Alert and oriented X 3, normal strength and tone. Normal symmetric reflexes. Normal coordination and gait  ECOG PERFORMANCE STATUS: 1 - Symptomatic but completely ambulatory  Blood pressure 131/75, pulse (!) 105, temperature 98.6 F (37 C), temperature source Temporal, resp. rate 16, height 5\' 2"  (1.575 m), weight 104 lb 12.8 oz (47.5 kg), SpO2 98%.  LABORATORY DATA: Lab Results  Component Value Date   WBC 6.4 04/23/2024   HGB 11.3 (L) 04/23/2024   HCT 34.1 (L) 04/23/2024   MCV 87.7 04/23/2024   PLT 336 04/23/2024      Chemistry      Component Value Date/Time   NA 142 01/20/2024 1437   NA 142 12/10/2017 1000   K 4.3 01/20/2024 1437   K 4.0 12/10/2017 1000   CL 105 01/20/2024 1437   CL 108 (H) 06/03/2013 0939   CO2 26 01/20/2024 1437   CO2 28 12/10/2017 1000   BUN 37 (H) 01/20/2024 1437   BUN 20.4 12/10/2017 1000   CREATININE 0.74 01/20/2024 1437   CREATININE 0.89 10/17/2023 1356   CREATININE 0.9 12/10/2017 1000      Component Value Date/Time   CALCIUM  9.4 01/20/2024 1437   CALCIUM  9.7 12/10/2017 1000   ALKPHOS 45 01/20/2024 1437   ALKPHOS 63 12/10/2017 1000   AST 33 01/20/2024 1437   AST 28 10/17/2023 1356    AST 25 12/10/2017 1000   ALT 33 01/20/2024 1437   ALT 20 10/17/2023 1356   ALT 17 12/10/2017 1000   BILITOT 0.6 01/20/2024 1437   BILITOT 0.7 10/17/2023 1356   BILITOT 1.05 12/10/2017 1000       RADIOGRAPHIC STUDIES: No results found.  ASSESSMENT AND PLAN:  This is a very pleasant 89 years old white female with stage IIIB non-small cell lung cancer, adenocarcinoma with positive EGFR mutation diagnosed in January 2010. She has been on treatment with Tarceva  for more than 15 years.   She is currently on Tarceva  100 mg p.o. daily status post 133  months. She has been tolerating this treatment well with no concerning adverse effects.    Non-small cell lung cancer, stage 3B Stage 3B non-small cell lung cancer, well-managed on Tarceva  (erlotinib ) 100 mg PO daily since March 2014. No issues with diarrhea or other side effects. She has been on Tarceva  for almost 15 years with good tolerance and effectiveness. Consideration of stopping the medication was discussed but decided against due to current stability and effectiveness. 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 Undergoing surveillance with cystoscopy scheduled for May 28th to assess current status. Previous treatment included six BCG treatments and complete tumor resection. The cancer is not muscle invasive or metastatic. - Proceed with cystoscopy on May 28th with Dr. Aimee Alf  Mild anemia Mild anemia with hemoglobin at 11.3, showing improvement from previous level of 10.6. No acute intervention required at this time.  Right knee pain and mobility issues Chronic right knee pain with significant mobility issues. Various treatments including injections and brace use have been attempted. Scheduled for a  nerve block consultation on June 20th due to inability to perform certain  procedures because of shrunken arteries. - Proceed with nerve block consultation on June 20th  The patient had frequent falls recently secondary to generalized weakness and fatigue.  She was also diagnosed with bladder mass consistent with urothelial carcinoma and expected to have surgical excision followed by gemcitabine  intravesical treatment. For the insomnia and depression, she is currently on Remeron  but her dose was reduced to 7.5 mg p.o. daily. The patient was advised to call immediately if she has any concerning symptoms in the interval.  The patient voices understanding of current disease status and treatment options and is in agreement with the current care plan. All questions were answered. The patient knows to call the clinic with any problems, questions or concerns. We can certainly see the patient much sooner if necessary. The total time spent in the appointment was 30 minutes.  Disclaimer: This note was dictated with voice recognition software. Similar sounding words can inadvertently be transcribed and may not be corrected upon review.

## 2024-04-29 DIAGNOSIS — D630 Anemia in neoplastic disease: Secondary | ICD-10-CM | POA: Diagnosis not present

## 2024-04-29 DIAGNOSIS — I5030 Unspecified diastolic (congestive) heart failure: Secondary | ICD-10-CM | POA: Diagnosis not present

## 2024-04-29 DIAGNOSIS — Z87891 Personal history of nicotine dependence: Secondary | ICD-10-CM | POA: Diagnosis not present

## 2024-04-29 DIAGNOSIS — J8410 Pulmonary fibrosis, unspecified: Secondary | ICD-10-CM | POA: Diagnosis not present

## 2024-04-29 DIAGNOSIS — Z85118 Personal history of other malignant neoplasm of bronchus and lung: Secondary | ICD-10-CM | POA: Diagnosis not present

## 2024-04-29 DIAGNOSIS — K219 Gastro-esophageal reflux disease without esophagitis: Secondary | ICD-10-CM | POA: Diagnosis not present

## 2024-04-29 DIAGNOSIS — J449 Chronic obstructive pulmonary disease, unspecified: Secondary | ICD-10-CM | POA: Diagnosis not present

## 2024-04-29 DIAGNOSIS — M810 Age-related osteoporosis without current pathological fracture: Secondary | ICD-10-CM | POA: Diagnosis not present

## 2024-04-29 DIAGNOSIS — Z87442 Personal history of urinary calculi: Secondary | ICD-10-CM | POA: Diagnosis not present

## 2024-04-29 DIAGNOSIS — E7800 Pure hypercholesterolemia, unspecified: Secondary | ICD-10-CM | POA: Diagnosis not present

## 2024-04-29 DIAGNOSIS — D414 Neoplasm of uncertain behavior of bladder: Secondary | ICD-10-CM | POA: Diagnosis not present

## 2024-04-29 DIAGNOSIS — M1990 Unspecified osteoarthritis, unspecified site: Secondary | ICD-10-CM | POA: Diagnosis not present

## 2024-04-29 DIAGNOSIS — S1181XD Laceration without foreign body of other specified part of neck, subsequent encounter: Secondary | ICD-10-CM | POA: Diagnosis not present

## 2024-04-29 DIAGNOSIS — E440 Moderate protein-calorie malnutrition: Secondary | ICD-10-CM | POA: Diagnosis not present

## 2024-04-29 DIAGNOSIS — Z9181 History of falling: Secondary | ICD-10-CM | POA: Diagnosis not present

## 2024-04-29 DIAGNOSIS — I110 Hypertensive heart disease with heart failure: Secondary | ICD-10-CM | POA: Diagnosis not present

## 2024-04-29 DIAGNOSIS — Z86711 Personal history of pulmonary embolism: Secondary | ICD-10-CM | POA: Diagnosis not present

## 2024-04-29 DIAGNOSIS — Z483 Aftercare following surgery for neoplasm: Secondary | ICD-10-CM | POA: Diagnosis not present

## 2024-04-29 DIAGNOSIS — Z7901 Long term (current) use of anticoagulants: Secondary | ICD-10-CM | POA: Diagnosis not present

## 2024-04-29 DIAGNOSIS — M069 Rheumatoid arthritis, unspecified: Secondary | ICD-10-CM | POA: Diagnosis not present

## 2024-05-06 DIAGNOSIS — K219 Gastro-esophageal reflux disease without esophagitis: Secondary | ICD-10-CM | POA: Diagnosis not present

## 2024-05-06 DIAGNOSIS — Z7901 Long term (current) use of anticoagulants: Secondary | ICD-10-CM | POA: Diagnosis not present

## 2024-05-06 DIAGNOSIS — E440 Moderate protein-calorie malnutrition: Secondary | ICD-10-CM | POA: Diagnosis not present

## 2024-05-06 DIAGNOSIS — D630 Anemia in neoplastic disease: Secondary | ICD-10-CM | POA: Diagnosis not present

## 2024-05-06 DIAGNOSIS — Z85118 Personal history of other malignant neoplasm of bronchus and lung: Secondary | ICD-10-CM | POA: Diagnosis not present

## 2024-05-06 DIAGNOSIS — Z9181 History of falling: Secondary | ICD-10-CM | POA: Diagnosis not present

## 2024-05-06 DIAGNOSIS — I5030 Unspecified diastolic (congestive) heart failure: Secondary | ICD-10-CM | POA: Diagnosis not present

## 2024-05-06 DIAGNOSIS — I110 Hypertensive heart disease with heart failure: Secondary | ICD-10-CM | POA: Diagnosis not present

## 2024-05-06 DIAGNOSIS — M810 Age-related osteoporosis without current pathological fracture: Secondary | ICD-10-CM | POA: Diagnosis not present

## 2024-05-06 DIAGNOSIS — Z86711 Personal history of pulmonary embolism: Secondary | ICD-10-CM | POA: Diagnosis not present

## 2024-05-06 DIAGNOSIS — Z483 Aftercare following surgery for neoplasm: Secondary | ICD-10-CM | POA: Diagnosis not present

## 2024-05-06 DIAGNOSIS — S1181XD Laceration without foreign body of other specified part of neck, subsequent encounter: Secondary | ICD-10-CM | POA: Diagnosis not present

## 2024-05-06 DIAGNOSIS — Z87891 Personal history of nicotine dependence: Secondary | ICD-10-CM | POA: Diagnosis not present

## 2024-05-06 DIAGNOSIS — E7800 Pure hypercholesterolemia, unspecified: Secondary | ICD-10-CM | POA: Diagnosis not present

## 2024-05-06 DIAGNOSIS — M069 Rheumatoid arthritis, unspecified: Secondary | ICD-10-CM | POA: Diagnosis not present

## 2024-05-06 DIAGNOSIS — Z87442 Personal history of urinary calculi: Secondary | ICD-10-CM | POA: Diagnosis not present

## 2024-05-06 DIAGNOSIS — J449 Chronic obstructive pulmonary disease, unspecified: Secondary | ICD-10-CM | POA: Diagnosis not present

## 2024-05-06 DIAGNOSIS — M1990 Unspecified osteoarthritis, unspecified site: Secondary | ICD-10-CM | POA: Diagnosis not present

## 2024-05-06 DIAGNOSIS — J8410 Pulmonary fibrosis, unspecified: Secondary | ICD-10-CM | POA: Diagnosis not present

## 2024-05-06 DIAGNOSIS — D414 Neoplasm of uncertain behavior of bladder: Secondary | ICD-10-CM | POA: Diagnosis not present

## 2024-05-07 NOTE — Addendum Note (Signed)
 Encounter addended by: Adam Holm on: 05/07/2024 10:35 AM  Actions taken: Imaging Exam ended

## 2024-05-13 DIAGNOSIS — Z483 Aftercare following surgery for neoplasm: Secondary | ICD-10-CM | POA: Diagnosis not present

## 2024-05-13 DIAGNOSIS — M810 Age-related osteoporosis without current pathological fracture: Secondary | ICD-10-CM | POA: Diagnosis not present

## 2024-05-13 DIAGNOSIS — I5030 Unspecified diastolic (congestive) heart failure: Secondary | ICD-10-CM | POA: Diagnosis not present

## 2024-05-13 DIAGNOSIS — Z87442 Personal history of urinary calculi: Secondary | ICD-10-CM | POA: Diagnosis not present

## 2024-05-13 DIAGNOSIS — M069 Rheumatoid arthritis, unspecified: Secondary | ICD-10-CM | POA: Diagnosis not present

## 2024-05-13 DIAGNOSIS — Z87891 Personal history of nicotine dependence: Secondary | ICD-10-CM | POA: Diagnosis not present

## 2024-05-13 DIAGNOSIS — E7800 Pure hypercholesterolemia, unspecified: Secondary | ICD-10-CM | POA: Diagnosis not present

## 2024-05-13 DIAGNOSIS — Z9181 History of falling: Secondary | ICD-10-CM | POA: Diagnosis not present

## 2024-05-13 DIAGNOSIS — K219 Gastro-esophageal reflux disease without esophagitis: Secondary | ICD-10-CM | POA: Diagnosis not present

## 2024-05-13 DIAGNOSIS — I110 Hypertensive heart disease with heart failure: Secondary | ICD-10-CM | POA: Diagnosis not present

## 2024-05-13 DIAGNOSIS — M1990 Unspecified osteoarthritis, unspecified site: Secondary | ICD-10-CM | POA: Diagnosis not present

## 2024-05-13 DIAGNOSIS — D630 Anemia in neoplastic disease: Secondary | ICD-10-CM | POA: Diagnosis not present

## 2024-05-13 DIAGNOSIS — Z7901 Long term (current) use of anticoagulants: Secondary | ICD-10-CM | POA: Diagnosis not present

## 2024-05-13 DIAGNOSIS — S1181XD Laceration without foreign body of other specified part of neck, subsequent encounter: Secondary | ICD-10-CM | POA: Diagnosis not present

## 2024-05-13 DIAGNOSIS — J449 Chronic obstructive pulmonary disease, unspecified: Secondary | ICD-10-CM | POA: Diagnosis not present

## 2024-05-13 DIAGNOSIS — Z86711 Personal history of pulmonary embolism: Secondary | ICD-10-CM | POA: Diagnosis not present

## 2024-05-13 DIAGNOSIS — J8410 Pulmonary fibrosis, unspecified: Secondary | ICD-10-CM | POA: Diagnosis not present

## 2024-05-13 DIAGNOSIS — Z85118 Personal history of other malignant neoplasm of bronchus and lung: Secondary | ICD-10-CM | POA: Diagnosis not present

## 2024-05-13 DIAGNOSIS — D414 Neoplasm of uncertain behavior of bladder: Secondary | ICD-10-CM | POA: Diagnosis not present

## 2024-05-13 DIAGNOSIS — E440 Moderate protein-calorie malnutrition: Secondary | ICD-10-CM | POA: Diagnosis not present

## 2024-05-14 ENCOUNTER — Other Ambulatory Visit (HOSPITAL_COMMUNITY): Payer: Self-pay | Admitting: Pharmacy Technician

## 2024-05-14 ENCOUNTER — Other Ambulatory Visit (HOSPITAL_COMMUNITY): Payer: Self-pay

## 2024-05-14 NOTE — Progress Notes (Signed)
 Specialty Pharmacy Refill Coordination Note  Dana [name] is a 89 y.o. female contacted today regarding refills of specialty medication(s) Erlotinib  HCl (TARCEVA )   Patient requested Delivery   Delivery date: 05/19/24   Verified address: [address] [place], [place], [place] [address]   Medication will be filled on 05/15/24.   Spoke to patient's daughter in [place].

## 2024-05-15 ENCOUNTER — Other Ambulatory Visit: Payer: Self-pay

## 2024-05-16 ENCOUNTER — Other Ambulatory Visit: Payer: Self-pay | Admitting: Physician Assistant

## 2024-05-16 DIAGNOSIS — R634 Abnormal weight loss: Secondary | ICD-10-CM

## 2024-05-20 DIAGNOSIS — I5030 Unspecified diastolic (congestive) heart failure: Secondary | ICD-10-CM | POA: Diagnosis not present

## 2024-05-20 DIAGNOSIS — J449 Chronic obstructive pulmonary disease, unspecified: Secondary | ICD-10-CM | POA: Diagnosis not present

## 2024-05-20 DIAGNOSIS — M1990 Unspecified osteoarthritis, unspecified site: Secondary | ICD-10-CM | POA: Diagnosis not present

## 2024-05-20 DIAGNOSIS — S1181XD Laceration without foreign body of other specified part of neck, subsequent encounter: Secondary | ICD-10-CM | POA: Diagnosis not present

## 2024-05-20 DIAGNOSIS — J8410 Pulmonary fibrosis, unspecified: Secondary | ICD-10-CM | POA: Diagnosis not present

## 2024-05-20 DIAGNOSIS — M810 Age-related osteoporosis without current pathological fracture: Secondary | ICD-10-CM | POA: Diagnosis not present

## 2024-05-20 DIAGNOSIS — E440 Moderate protein-calorie malnutrition: Secondary | ICD-10-CM | POA: Diagnosis not present

## 2024-05-20 DIAGNOSIS — D630 Anemia in neoplastic disease: Secondary | ICD-10-CM | POA: Diagnosis not present

## 2024-05-20 DIAGNOSIS — Z9181 History of falling: Secondary | ICD-10-CM | POA: Diagnosis not present

## 2024-05-20 DIAGNOSIS — Z85118 Personal history of other malignant neoplasm of bronchus and lung: Secondary | ICD-10-CM | POA: Diagnosis not present

## 2024-05-20 DIAGNOSIS — K219 Gastro-esophageal reflux disease without esophagitis: Secondary | ICD-10-CM | POA: Diagnosis not present

## 2024-05-20 DIAGNOSIS — M069 Rheumatoid arthritis, unspecified: Secondary | ICD-10-CM | POA: Diagnosis not present

## 2024-05-20 DIAGNOSIS — D414 Neoplasm of uncertain behavior of bladder: Secondary | ICD-10-CM | POA: Diagnosis not present

## 2024-05-20 DIAGNOSIS — E7800 Pure hypercholesterolemia, unspecified: Secondary | ICD-10-CM | POA: Diagnosis not present

## 2024-05-20 DIAGNOSIS — Z87891 Personal history of nicotine dependence: Secondary | ICD-10-CM | POA: Diagnosis not present

## 2024-05-20 DIAGNOSIS — Z87442 Personal history of urinary calculi: Secondary | ICD-10-CM | POA: Diagnosis not present

## 2024-05-20 DIAGNOSIS — Z7901 Long term (current) use of anticoagulants: Secondary | ICD-10-CM | POA: Diagnosis not present

## 2024-05-20 DIAGNOSIS — Z86711 Personal history of pulmonary embolism: Secondary | ICD-10-CM | POA: Diagnosis not present

## 2024-05-20 DIAGNOSIS — Z483 Aftercare following surgery for neoplasm: Secondary | ICD-10-CM | POA: Diagnosis not present

## 2024-05-20 DIAGNOSIS — I110 Hypertensive heart disease with heart failure: Secondary | ICD-10-CM | POA: Diagnosis not present

## 2024-05-22 DIAGNOSIS — D414 Neoplasm of uncertain behavior of bladder: Secondary | ICD-10-CM | POA: Diagnosis not present

## 2024-05-22 DIAGNOSIS — C672 Malignant neoplasm of lateral wall of bladder: Secondary | ICD-10-CM | POA: Diagnosis not present

## 2024-05-25 ENCOUNTER — Other Ambulatory Visit: Payer: Self-pay | Admitting: Urology

## 2024-05-27 DIAGNOSIS — Z87442 Personal history of urinary calculi: Secondary | ICD-10-CM | POA: Diagnosis not present

## 2024-05-27 DIAGNOSIS — Z85118 Personal history of other malignant neoplasm of bronchus and lung: Secondary | ICD-10-CM | POA: Diagnosis not present

## 2024-05-27 DIAGNOSIS — Z7901 Long term (current) use of anticoagulants: Secondary | ICD-10-CM | POA: Diagnosis not present

## 2024-05-27 DIAGNOSIS — Z483 Aftercare following surgery for neoplasm: Secondary | ICD-10-CM | POA: Diagnosis not present

## 2024-05-27 DIAGNOSIS — Z86711 Personal history of pulmonary embolism: Secondary | ICD-10-CM | POA: Diagnosis not present

## 2024-05-27 DIAGNOSIS — M1990 Unspecified osteoarthritis, unspecified site: Secondary | ICD-10-CM | POA: Diagnosis not present

## 2024-05-27 DIAGNOSIS — K219 Gastro-esophageal reflux disease without esophagitis: Secondary | ICD-10-CM | POA: Diagnosis not present

## 2024-05-27 DIAGNOSIS — D414 Neoplasm of uncertain behavior of bladder: Secondary | ICD-10-CM | POA: Diagnosis not present

## 2024-05-27 DIAGNOSIS — M069 Rheumatoid arthritis, unspecified: Secondary | ICD-10-CM | POA: Diagnosis not present

## 2024-05-27 DIAGNOSIS — J449 Chronic obstructive pulmonary disease, unspecified: Secondary | ICD-10-CM | POA: Diagnosis not present

## 2024-05-27 DIAGNOSIS — Z9181 History of falling: Secondary | ICD-10-CM | POA: Diagnosis not present

## 2024-05-27 DIAGNOSIS — M810 Age-related osteoporosis without current pathological fracture: Secondary | ICD-10-CM | POA: Diagnosis not present

## 2024-05-27 DIAGNOSIS — I110 Hypertensive heart disease with heart failure: Secondary | ICD-10-CM | POA: Diagnosis not present

## 2024-05-27 DIAGNOSIS — Z87891 Personal history of nicotine dependence: Secondary | ICD-10-CM | POA: Diagnosis not present

## 2024-05-27 DIAGNOSIS — E7800 Pure hypercholesterolemia, unspecified: Secondary | ICD-10-CM | POA: Diagnosis not present

## 2024-05-27 DIAGNOSIS — E440 Moderate protein-calorie malnutrition: Secondary | ICD-10-CM | POA: Diagnosis not present

## 2024-05-27 DIAGNOSIS — D630 Anemia in neoplastic disease: Secondary | ICD-10-CM | POA: Diagnosis not present

## 2024-05-27 DIAGNOSIS — J8410 Pulmonary fibrosis, unspecified: Secondary | ICD-10-CM | POA: Diagnosis not present

## 2024-05-27 DIAGNOSIS — I5030 Unspecified diastolic (congestive) heart failure: Secondary | ICD-10-CM | POA: Diagnosis not present

## 2024-05-27 DIAGNOSIS — S1181XD Laceration without foreign body of other specified part of neck, subsequent encounter: Secondary | ICD-10-CM | POA: Diagnosis not present

## 2024-05-28 NOTE — Patient Instructions (Signed)
 DUE TO COVID-19 ONLY TWO VISITORS  (aged 87 and older)  ARE ALLOWED TO COME WITH YOU AND STAY IN THE WAITING ROOM ONLY DURING PRE OP AND PROCEDURE.   **NO VISITORS ARE ALLOWED IN THE SHORT STAY AREA OR RECOVERY ROOM!!**  IF YOU WILL BE ADMITTED INTO THE HOSPITAL YOU ARE ALLOWED ONLY FOUR SUPPORT PEOPLE DURING VISITATION HOURS ONLY (7 AM -8PM)   The support person(s) must pass our screening, gel in and out, and wear a mask at all times, including in the patient's room. Patients must also wear a mask when staff or their support person are in the room. Visitors GUEST BADGE MUST BE WORN VISIBLY  One adult visitor may remain with you overnight and MUST be in the room by 8 P.M.     Your procedure is scheduled on: 06/02/24   Report to [facility] Main Entrance    Report to admitting at : 1:00 PM   Call this number if you have problems the morning of surgery [phone]   Do not eat food :After Midnight.   After Midnight you may have the following liquids until : 12:00 PM DAY OF SURGERY  Water  Black Coffee (sugar ok, NO MILK/CREAM OR CREAMERS)  Tea (sugar ok, NO MILK/CREAM OR CREAMERS) regular and decaf                             Plain Jell-O (NO RED)                                           Fruit ices (not with fruit pulp, NO RED)                                     Popsicles (NO RED)                                                                  Juice: apple, WHITE grape, WHITE cranberry Sports drinks like Gatorade (NO RED)              FOLLOW ANY ADDITIONAL PRE OP INSTRUCTIONS YOU RECEIVED FROM YOUR SURGEON'S OFFICE!!!   Oral Hygiene is also important to reduce your risk of infection.                                    Remember - BRUSH YOUR TEETH THE MORNING OF SURGERY WITH YOUR REGULAR TOOTHPASTE  DENTURES WILL BE REMOVED PRIOR TO SURGERY PLEASE DO NOT APPLY "Poly grip" OR ADHESIVES!!!   Do NOT smoke after Midnight   Take these medicines the morning of surgery with A  SIP OF WATER : tarceva (erlotinib ),bactrim . Tylenol ,loratadine  as needed.                              You may not have any metal on your body including hair pins, jewelry, and body piercing  Do not wear make-up, lotions, powders, perfumes/cologne, or deodorant  Do not wear nail polish including gel and S&S, artificial/acrylic nails, or any other type of covering on natural nails including finger and toenails. If you have artificial nails, gel coating, etc. that needs to be removed by a nail salon please have this removed prior to surgery or surgery may need to be canceled/ delayed if the surgeon/ anesthesia feels like they are unable to be safely monitored.   Do not shave  48 hours prior to surgery.    Do not bring valuables to the hospital. [facility] IS NOT             RESPONSIBLE   FOR VALUABLES.   Contacts, glasses, or bridgework may not be worn into surgery.   Bring small overnight bag day of surgery.   DO NOT BRING YOUR HOME MEDICATIONS TO THE HOSPITAL. PHARMACY WILL DISPENSE MEDICATIONS LISTED ON YOUR MEDICATION LIST TO YOU DURING YOUR ADMISSION IN THE HOSPITAL!    Patients discharged on the day of surgery will not be allowed to drive home.  Someone NEEDS to stay with you for the first 24 hours after anesthesia.   Special Instructions: Bring a copy of your healthcare power of attorney and living will documents         the day of surgery if you haven't scanned them before.              Please read over the following fact sheets you were given: IF YOU HAVE QUESTIONS ABOUT YOUR PRE-OP INSTRUCTIONS PLEASE CALL [phone]    [facility] - Preparing for Surgery Before surgery, you can play an important role.  Because skin is not sterile, your skin needs to be as free of germs as possible.  You can reduce the number of germs on your skin by washing with CHG (chlorahexidine gluconate) soap before surgery.  CHG is an antiseptic cleaner which kills germs and bonds with the skin  to continue killing germs even after washing. Please DO NOT use if you have an allergy to CHG or antibacterial soaps.  If your skin becomes reddened/irritated stop using the CHG and inform your nurse when you arrive at Short Stay. Do not shave (including legs and underarms) for at least 48 hours prior to the first CHG shower.  You may shave your face/neck. Please follow these instructions carefully:  1.  Shower with CHG Soap the night before surgery and the  morning of Surgery.  2.  If you choose to wash your hair, wash your hair first as usual with your  normal  shampoo.  3.  After you shampoo, rinse your hair and body thoroughly to remove the  shampoo.                           4.  Use CHG as you would any other liquid soap.  You can apply chg directly  to the skin and wash                       Gently with a scrungie or clean washcloth.  5.  Apply the CHG Soap to your body ONLY FROM THE NECK DOWN.   Do not use on face/ open                           Wound or open sores. Avoid contact with eyes,  ears mouth and genitals (private parts).                       Wash face,  Genitals (private parts) with your normal soap.             6.  Wash thoroughly, paying special attention to the area where your surgery  will be performed.  7.  Thoroughly rinse your body with warm water  from the neck down.  8.  DO NOT shower/wash with your normal soap after using and rinsing off  the CHG Soap.                9.  Pat yourself dry with a clean towel.            10.  Wear clean pajamas.            11.  Place clean sheets on your bed the night of your first shower and do not  sleep with pets. Day of Surgery : Do not apply any lotions/deodorants the morning of surgery.  Please wear clean clothes to the hospital/surgery center.  FAILURE TO FOLLOW THESE INSTRUCTIONS MAY RESULT IN THE CANCELLATION OF YOUR SURGERY PATIENT SIGNATURE_________________________________  NURSE  SIGNATURE__________________________________  ________________________________________________________________________

## 2024-05-29 DIAGNOSIS — L308 Other specified dermatitis: Secondary | ICD-10-CM | POA: Diagnosis not present

## 2024-05-29 DIAGNOSIS — L905 Scar conditions and fibrosis of skin: Secondary | ICD-10-CM | POA: Diagnosis not present

## 2024-05-29 DIAGNOSIS — C44622 Squamous cell carcinoma of skin of right upper limb, including shoulder: Secondary | ICD-10-CM | POA: Diagnosis not present

## 2024-05-30 NOTE — H&P (Signed)
 10 F referred for enhancing mass on posteroir R bladder wall lesions on CT. Lesions discovered after patient had fall and presented to ER for CT. 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 Mass was discovered after thickness of the bladder was noted, she denies any gross hematuria. She denies any history of professional exposure to solvents. She was a longtime smoker quit 23 years ago.   Lung cancer: currently on Tarceva  for NSCLC stage IIIB, has been on since 2014.   PMH: Lung cancer, HTN, COPD, GERD, R knee pain. No blood thinners, strong FH of bladder cancer   TCC:  12/26/22: had PE and 2/2 to lung cacner DC 12/4. On Eliquis . Will do cyto for eval of bladder mass. Cysto today shows tumor posterior to the right UO. size is 3-4 cm. On Elquis for PE managed by PCP. PCP Conni Deis, Oncologist [facility] Cardiologist.  02/03/24: NMIBC TaHG TCC recent PE will start BCG, GH no pain.  05/22/34: completed 6/6 BCG induction on 04/01/24. here today for cysto shows irregular scarrring near the previous resection site. no other irregularities. CT in 10/24 shows no hydro.     ALLERGIES: Amoxicillin Cephalexin  - Possible diarrhea Pantoprazole     MEDICATIONS: Biotin  Bismatrol 262 MG/15ML Suspension  Calcium  + D TABS Oral  Cholecalciferol   Lactaid  Loratadine  10 MG Tablet  Metamucil  Miralax  Mirtazapine  15 MG Tablet  Multivitamin  Probiotic  Simethicone   Stiolto Respimat  2.5-2.5 MCG/ACT Aerosol Solution  Tacrolimus 1 MG Capsule  Tarceva  100 MG Tablet  Vitamin B Complex  Vitamin B12     GU PSH: Bladder Instill AntiCA Agent - 04/01/2024, 03/25/2024, 03/18/2024, 03/11/2024, 03/04/2024, 02/26/2024 Bladder Repair - 2011 Cystoscopy - 12/27/2023 ESWL, Left - 2020 Hysterectomy Unilat SO - 2010 Insert Mesh/pelvic Flr Addon - 2011 Repair Vaginal Prolapse - 2011       PSH Notes: Anterior Colporrhaphy, Repair Of Cystocele, Sacrospinous Ligament Fixation For Posthysterectomy Prolapse,  Vaginal Surg Insertion Of Mesh For Pelvic Floor Repair, Breast Surgery, Hysterectomy, Knee Surgery   NON-GU PSH: Breast Surgery Procedure - 2010 Knee Arthroscopy, Right Visit Complexity (formerly GPC1X) - 02/03/2024, 12/27/2023 Wrist Arthroscopy, Right, x's 2     GU PMH: Bladder Cancer Lateral - 04/01/2024, - 03/25/2024, - 03/18/2024, - 03/11/2024, - 03/04/2024, - 02/26/2024, - 02/03/2024 Bladder tumor/neoplasm - 12/27/2023, - 10/30/2023 Ureteral calculus - 2020 Renal and ureteral calculus (Acute), Left - 2020 Cystocele, Unspec, Vaginal wall prolapse - 2014 Incontinence w/o Sensation, Urinary incontinence without sensory awareness - 2014 Nocturia, Nocturia - 2014 Urinary Frequency, Increased urinary frequency - 2014 Urinary Tract Inf, Unspec site, Urinary tract infection - 2014      PMH Notes:  1898-12-24 00:00:00 - Note: Normal Routine History And Physical Senior Citizen (65-80)  2009-06-01 11:14:03 - Note: Chronic Reflux Esophagitis  2009-06-01 11:14:03 - Note: Arthritis   NON-GU PMH: Personal history of other endocrine, nutritional and metabolic disease, History of hypercholesterolemia - 2014 Arthritis Lung Cancer, History    FAMILY HISTORY: 3 Son's - Son Bladder Cancer - Father Death In The Family Father - Father Death In The Family Mother - Mother   SOCIAL HISTORY: Marital Status: Married Preferred Language: English; Ethnicity: Not Hispanic Or Latino; Race: White Current Smoking Status: Patient does not smoke anymore. Has not smoked since 05/24/1989. Smoked for 20 years. Smoked 1/2 pack per day.   Tobacco Use Assessment Completed: Used Tobacco in last 30 days? Has never drank.  Drinks 1 caffeinated drink per day. Patient's  occupation is/was retired.    REVIEW OF SYSTEMS:    GU Review Female:   Patient denies frequent urination, hard to postpone urination, burning /pain with urination, get up at night to urinate, leakage of urine, stream starts and stops, trouble starting your stream,  have to strain to urinate, and being pregnant.  Gastrointestinal (Upper):   Patient denies nausea, vomiting, and indigestion/ heartburn.  Gastrointestinal (Lower):   Patient denies constipation and diarrhea.  Constitutional:   Patient denies fever, night sweats, weight loss, and fatigue.  Skin:   Patient denies skin rash/ lesion and itching.  Eyes:   Patient denies blurred vision and double vision.  Ears/ Nose/ Throat:   Patient denies sore throat and sinus problems.  Hematologic/Lymphatic:   Patient denies swollen glands and easy bruising.  Cardiovascular:   Patient denies leg swelling and chest pains.  Respiratory:   Patient denies cough and shortness of breath.  Endocrine:   Patient denies excessive thirst.  Musculoskeletal:   Patient denies back pain and joint pain.  Neurological:   Patient denies headaches and dizziness.  Psychologic:   Patient denies depression and anxiety.   VITAL SIGNS: None   Complexity of Data:  Source Of History:  Patient  Records Review:   Previous Patient Records  Urine Test Review:   Urinalysis   PROCEDURES:         Flexible Cystoscopy - 52000  Risks, benefits, and some of the potential complications of the procedure were discussed at length with the patient including infection, bleeding, voiding discomfort, urinary retention, fever, chills, sepsis, and others. All questions were answered. Informed consent was obtained. Antibiotic prophylaxis was given. Sterile technique and intraurethral analgesia were used.  Meatus:  Normal size. Normal location. Normal condition.  Urethra:  No hypermobility. No leakage.  Ureteral Orifices:  Irregular scarring noted at the right resection site. Obscuring the right ureteral orifice.  Bladder:  Some BCG changes noted throughout the bladder. Regular scarring still present at the previous resection site feel that this should be rebiopsied. This covers the right ureteral orifice.      The lower urinary tract was carefully  examined. The procedure was well-tolerated and without complications. Antibiotic instructions were given. Instructions were given to call the office immediately for bloody urine, difficulty urinating, urinary retention, painful or frequent urination, fever, chills, nausea, vomiting or other illness. The patient stated that she understood these instructions and would comply with them.         Urinalysis w/Scope - 81001 Dipstick Dipstick Cont'd Micro  Color: Yellow Bilirubin: Neg WBC/hpf: 0 - 5/hpf  Appearance: Clear Ketones: Neg RBC/hpf: 0 - 2/hpf  Specific Gravity: 1.015 Blood: Neg Bacteria: Rare (0-9/hpf)  pH: 6.0 Protein: 1+ Cystals: NS (Not Seen)  Glucose: Neg Urobilinogen: 0.2 Casts: NS (Not Seen)    Nitrites: Neg Trichomonas: Not Present    Leukocyte Esterase: 1+ Mucous: Not Present      Epithelial Cells: NS (Not Seen)      Yeast: NS (Not Seen)      Sperm: Not Present    Notes:  micro performed on unspun urine due to QNS     ASSESSMENT:      ICD-10 Details  1 GU:   Bladder Cancer Lateral - C67.2 Chronic, Worsening  2   Bladder tumor/neoplasm - D41.4 Chronic, Worsening     PLAN:           Schedule Labs: Today 05/21/2024 - Urine Culture    Today 05/21/2024 - CBC with  Diff    Today 05/21/2024 - BMP          Document Letter(s):  Created for Patient: Clinical Summary         Notes:   Bladder cancer: Cystoscopy demonstrates irregular mucosa/scarring at the previous resection site near the right ureteral orifice on the trigone. Because of this we will plan to do TURBT  Plan for TURBT today. Sent bactrim  prior to procedure.   Patient is now off blood thinner.  She is on Tarceva  will continue this through surgery.   We discussed risk benefits alternatives to transurethral resection of bladder tumor. Benefits are diagnostic and therapeutic which include removal of bladder tumor and relieve the possible irritative symptoms from bladder tumor. We discussed risk including not  being able to remove all of the tumor, possibility of perforation of the bladder requiring long-term catheter or operative intervention to repair. The need for catheter postoperatively was also discussed, as well as postoperative lower urinary tract symptoms in the immediate postop period. Patient voiced their understanding and would like to proceed with the surgery.

## 2024-06-01 ENCOUNTER — Other Ambulatory Visit: Payer: Self-pay

## 2024-06-01 ENCOUNTER — Encounter (HOSPITAL_COMMUNITY): Payer: Self-pay

## 2024-06-01 ENCOUNTER — Encounter (HOSPITAL_COMMUNITY)
Admission: RE | Admit: 2024-06-01 | Discharge: 2024-06-01 | Disposition: A | Discharge status: Home / Self Care | Source: Ambulatory Visit | Attending: Urology | Admitting: Urology

## 2024-06-01 VITALS — BP 149/89 | HR 91 | Temp 97.5°F | Ht 62.0 in | Wt 100.0 lb

## 2024-06-01 DIAGNOSIS — Z01812 Encounter for preprocedural laboratory examination: Secondary | ICD-10-CM | POA: Insufficient documentation

## 2024-06-01 DIAGNOSIS — I10 Essential (primary) hypertension: Secondary | ICD-10-CM | POA: Insufficient documentation

## 2024-06-01 LAB — BASIC METABOLIC PANEL WITH GFR
Anion gap: 9 (ref 5–15)
BUN: 31 mg/dL — ABNORMAL HIGH (ref 8–23)
CO2: 25 mmol/L (ref 22–32)
Calcium: 9.7 mg/dL (ref 8.9–10.3)
Chloride: 104 mmol/L (ref 98–111)
Creatinine, Ser: 0.99 mg/dL (ref 0.44–1.00)
GFR, Estimated: 55 mL/min — ABNORMAL LOW (ref >60)
Glucose, Bld: 90 mg/dL (ref 70–99)
Potassium: 4 mmol/L (ref 3.5–5.1)
Sodium: 138 mmol/L (ref 135–145)

## 2024-06-01 LAB — CBC
HCT: 35.5 % — ABNORMAL LOW (ref 36.0–46.0)
Hemoglobin: 11.5 g/dL — ABNORMAL LOW (ref 12.0–15.0)
MCH: 29.8 pg (ref 26.0–34.0)
MCHC: 32.4 g/dL (ref 30.0–36.0)
MCV: 92 fL (ref 80.0–100.0)
Platelets: 300 10*3/uL (ref 150–400)
RBC: 3.86 MIL/uL — ABNORMAL LOW (ref 3.87–5.11)
RDW: 17.2 % — ABNORMAL HIGH (ref 11.5–15.5)
WBC: 7.2 10*3/uL (ref 4.0–10.5)
nRBC: 0 % (ref 0.0–0.2)

## 2024-06-01 MED ORDER — GENTAMICIN SULFATE 40 MG/ML IJ SOLN
5.0000 mg/kg | INTRAVENOUS | Status: AC
  Administered 2024-06-02: 237.6 mg via INTRAVENOUS
  Filled 2024-06-01: qty 6

## 2024-06-01 NOTE — Progress Notes (Addendum)
 For Anesthesia: PCP - [name], MD  Cardiologist - Loyde Rule, MD   Bowel Prep reminder:  Chest x-ray - 11/24/23. CT angio: 11/24/23 EKG - 11/24/23 Stress Test -  ECHO - 11/25/23 Cardiac Cath -  Pacemaker/ICD device last checked: Pacemaker orders received: Device Rep notified:  Spinal Cord Stimulator:N/A  Sleep Study - N/A CPAP -   Fasting Blood Sugar - N/A Checks Blood Sugar _____ times a day Date and result of last Hgb A1c-  Last dose of GLP1 agonist- N/A GLP1 instructions:   Last dose of SGLT-2 inhibitors- N/A SGLT-2 instructions:   Blood Thinner Instructions:N/A Aspirin  Instructions: Last Dose:  Activity level: Can go up a flight of stairs and activities of daily living without stopping and without chest pain and/or shortness of breath      Unable to go up a flight of stairs without shortness of breath    Anesthesia review: Hx: Heart failure,CAP,Sjogren's Syn.,PE,HTN,COPD,Lung Ca.  Patient denies shortness of breath, fever, cough and chest pain at PAT appointment   Patient verbalized understanding of instructions that were given to them at the PAT appointment. Patient was also instructed that they will need to review over the PAT instructions again at home before surgery.

## 2024-06-02 ENCOUNTER — Other Ambulatory Visit: Payer: Self-pay

## 2024-06-02 ENCOUNTER — Ambulatory Visit (HOSPITAL_COMMUNITY)
Admission: RE | Admit: 2024-06-02 | Discharge: 2024-06-02 | Disposition: A | Discharge status: Home / Self Care | Attending: Urology | Admitting: Urology

## 2024-06-02 ENCOUNTER — Ambulatory Visit (HOSPITAL_COMMUNITY): Admitting: Physician Assistant

## 2024-06-02 ENCOUNTER — Encounter (HOSPITAL_COMMUNITY)
Admission: RE | Disposition: A | Discharge status: Home / Self Care | Payer: Self-pay | Source: Home / Self Care | Attending: Urology

## 2024-06-02 ENCOUNTER — Ambulatory Visit (HOSPITAL_COMMUNITY)

## 2024-06-02 ENCOUNTER — Ambulatory Visit (HOSPITAL_COMMUNITY): Admitting: Anesthesiology

## 2024-06-02 ENCOUNTER — Encounter (HOSPITAL_COMMUNITY): Payer: Self-pay | Admitting: Urology

## 2024-06-02 DIAGNOSIS — Z7969 Long term (current) use of other immunomodulators and immunosuppressants: Secondary | ICD-10-CM | POA: Insufficient documentation

## 2024-06-02 DIAGNOSIS — C679 Malignant neoplasm of bladder, unspecified: Secondary | ICD-10-CM | POA: Diagnosis not present

## 2024-06-02 DIAGNOSIS — Z7901 Long term (current) use of anticoagulants: Secondary | ICD-10-CM | POA: Diagnosis not present

## 2024-06-02 DIAGNOSIS — C3490 Malignant neoplasm of unspecified part of unspecified bronchus or lung: Secondary | ICD-10-CM | POA: Diagnosis not present

## 2024-06-02 DIAGNOSIS — I110 Hypertensive heart disease with heart failure: Secondary | ICD-10-CM

## 2024-06-02 DIAGNOSIS — C672 Malignant neoplasm of lateral wall of bladder: Secondary | ICD-10-CM | POA: Insufficient documentation

## 2024-06-02 DIAGNOSIS — J449 Chronic obstructive pulmonary disease, unspecified: Secondary | ICD-10-CM | POA: Diagnosis not present

## 2024-06-02 DIAGNOSIS — Z86711 Personal history of pulmonary embolism: Secondary | ICD-10-CM | POA: Diagnosis not present

## 2024-06-02 DIAGNOSIS — I509 Heart failure, unspecified: Secondary | ICD-10-CM | POA: Diagnosis not present

## 2024-06-02 DIAGNOSIS — N3289 Other specified disorders of bladder: Secondary | ICD-10-CM | POA: Diagnosis not present

## 2024-06-02 DIAGNOSIS — N3020 Other chronic cystitis without hematuria: Secondary | ICD-10-CM | POA: Diagnosis not present

## 2024-06-02 DIAGNOSIS — I10 Essential (primary) hypertension: Secondary | ICD-10-CM | POA: Diagnosis not present

## 2024-06-02 DIAGNOSIS — I5030 Unspecified diastolic (congestive) heart failure: Secondary | ICD-10-CM | POA: Diagnosis not present

## 2024-06-02 DIAGNOSIS — D494 Neoplasm of unspecified behavior of bladder: Secondary | ICD-10-CM | POA: Diagnosis not present

## 2024-06-02 HISTORY — PX: TRANSURETHRAL RESECTION OF BLADDER TUMOR: SHX2575

## 2024-06-02 HISTORY — PX: CYSTOSCOPY W/ RETROGRADES: SHX1426

## 2024-06-02 SURGERY — TURBT (TRANSURETHRAL RESECTION OF BLADDER TUMOR)
Anesthesia: General | Site: Bladder

## 2024-06-02 MED ORDER — PROPOFOL 500 MG/50ML IV EMUL
INTRAVENOUS | Status: DC | PRN
  Administered 2024-06-02: 100 ug/kg/min via INTRAVENOUS

## 2024-06-02 MED ORDER — PROPOFOL 10 MG/ML IV BOLUS
INTRAVENOUS | Status: DC | PRN
  Administered 2024-06-02: 40 mg via INTRAVENOUS
  Administered 2024-06-02: 30 mg via INTRAVENOUS
  Administered 2024-06-02: 20 mg via INTRAVENOUS
  Administered 2024-06-02: 60 mg via INTRAVENOUS

## 2024-06-02 MED ORDER — FENTANYL CITRATE PF 50 MCG/ML IJ SOSY: 25.0000 ug | PREFILLED_SYRINGE | INTRAMUSCULAR | Status: DC | PRN

## 2024-06-02 MED ORDER — ORAL CARE MOUTH RINSE: 15.0000 mL | Freq: Once | OROMUCOSAL | Status: AC

## 2024-06-02 MED ORDER — SUGAMMADEX SODIUM 200 MG/2ML IV SOLN
INTRAVENOUS | Status: DC | PRN
  Administered 2024-06-02: 90 mg via INTRAVENOUS

## 2024-06-02 MED ORDER — DEXAMETHASONE SODIUM PHOSPHATE 10 MG/ML IJ SOLN
INTRAMUSCULAR | Status: AC
  Filled 2024-06-02: qty 1

## 2024-06-02 MED ORDER — FENTANYL CITRATE (PF) 100 MCG/2ML IJ SOLN
INTRAMUSCULAR | Status: DC | PRN
  Administered 2024-06-02 (×3): 25 ug via INTRAVENOUS

## 2024-06-02 MED ORDER — PHENYLEPHRINE HCL (PRESSORS) 10 MG/ML IV SOLN
INTRAVENOUS | Status: AC
  Filled 2024-06-02: qty 1

## 2024-06-02 MED ORDER — PHENYLEPHRINE HCL-NACL 20-0.9 MG/250ML-% IV SOLN
INTRAVENOUS | Status: DC | PRN
  Administered 2024-06-02: 40 ug/min via INTRAVENOUS

## 2024-06-02 MED ORDER — ONDANSETRON HCL 4 MG/2ML IJ SOLN: 4.0000 mg | Freq: Once | INTRAMUSCULAR | Status: DC | PRN

## 2024-06-02 MED ORDER — DEXMEDETOMIDINE HCL IN NACL 80 MCG/20ML IV SOLN
INTRAVENOUS | Status: DC | PRN
Start: 2024-06-02 — End: 2024-06-02
  Administered 2024-06-02: 4 ug via INTRAVENOUS

## 2024-06-02 MED ORDER — PROPOFOL 10 MG/ML IV BOLUS
INTRAVENOUS | Status: AC
  Filled 2024-06-02: qty 20

## 2024-06-02 MED ORDER — ROCURONIUM BROMIDE 10 MG/ML (PF) SYRINGE
PREFILLED_SYRINGE | INTRAVENOUS | Status: AC
  Filled 2024-06-02: qty 10

## 2024-06-02 MED ORDER — POLYETHYLENE GLYCOL 3350 17 G PO PACK: 17.0000 g | PACK | Freq: Every day | ORAL | 0 refills | Status: AC

## 2024-06-02 MED ORDER — ROCURONIUM BROMIDE 50 MG/5ML IV SOSY
PREFILLED_SYRINGE | INTRAVENOUS | Status: DC | PRN
  Administered 2024-06-02: 30 mg via INTRAVENOUS

## 2024-06-02 MED ORDER — FENTANYL CITRATE (PF) 100 MCG/2ML IJ SOLN
INTRAMUSCULAR | Status: AC
Start: 2024-06-02 — End: ?
  Filled 2024-06-02: qty 2

## 2024-06-02 MED ORDER — METHOCARBAMOL 750 MG PO TABS: 750.0000 mg | ORAL_TABLET | Freq: Four times a day (QID) | ORAL | 0 refills | Status: DC

## 2024-06-02 MED ORDER — LIDOCAINE HCL (PF) 2 % IJ SOLN
INTRAMUSCULAR | Status: AC
  Filled 2024-06-02: qty 5

## 2024-06-02 MED ORDER — LIDOCAINE 2% (20 MG/ML) 5 ML SYRINGE
INTRAMUSCULAR | Status: DC | PRN
  Administered 2024-06-02: 60 mg via INTRAVENOUS

## 2024-06-02 MED ORDER — LACTATED RINGERS IV SOLN: INTRAVENOUS | Status: DC

## 2024-06-02 MED ORDER — ONDANSETRON HCL 4 MG/2ML IJ SOLN
INTRAMUSCULAR | Status: DC | PRN
  Administered 2024-06-02: 4 mg via INTRAVENOUS

## 2024-06-02 MED ORDER — PHENYLEPHRINE 80 MCG/ML (10ML) SYRINGE FOR IV PUSH (FOR BLOOD PRESSURE SUPPORT)
PREFILLED_SYRINGE | INTRAVENOUS | Status: DC | PRN
Start: 2024-06-02 — End: 2024-06-02
  Administered 2024-06-02 (×3): 160 ug via INTRAVENOUS
  Administered 2024-06-02 (×2): 80 ug via INTRAVENOUS

## 2024-06-02 MED ORDER — DEXAMETHASONE SODIUM PHOSPHATE 10 MG/ML IJ SOLN
INTRAMUSCULAR | Status: DC | PRN
  Administered 2024-06-02: 4 mg via INTRAVENOUS

## 2024-06-02 MED ORDER — ACETAMINOPHEN 10 MG/ML IV SOLN: 1000.0000 mg | Freq: Once | INTRAVENOUS | Status: DC | PRN

## 2024-06-02 MED ORDER — CHLORHEXIDINE GLUCONATE 0.12 % MT SOLN
15.0000 mL | Freq: Once | OROMUCOSAL | Status: AC
  Administered 2024-06-02: 15 mL via OROMUCOSAL

## 2024-06-02 MED ORDER — ONDANSETRON HCL 4 MG/2ML IJ SOLN
INTRAMUSCULAR | Status: AC
  Filled 2024-06-02: qty 2

## 2024-06-02 MED ORDER — PHENYLEPHRINE HCL (PRESSORS) 10 MG/ML IV SOLN
INTRAVENOUS | Status: AC
Start: 2024-06-02 — End: ?
  Filled 2024-06-02: qty 1

## 2024-06-02 MED ORDER — SODIUM CHLORIDE 0.9 % IR SOLN
Status: DC | PRN
  Administered 2024-06-02: 3000 mL

## 2024-06-02 SURGICAL SUPPLY — 23 items
BAG COUNTER SPONGE SURGICOUNT (BAG) IMPLANT
BAG URO CATCHER STRL LF (MISCELLANEOUS) ×2 IMPLANT
CATH FOLEY 2WAY SLVR 5CC 18FR (CATHETERS) IMPLANT
CATH URETL OPEN 5X70 (CATHETERS) IMPLANT
CLOTH BEACON ORANGE TIMEOUT ST (SAFETY) ×2 IMPLANT
DRAPE FOOT SWITCH (DRAPES) ×2 IMPLANT
ELECT REM PT RETURN 15FT ADLT (MISCELLANEOUS) ×2 IMPLANT
GLOVE BIO SURGEON STRL SZ8 (GLOVE) ×2 IMPLANT
GOWN STRL REUS W/ TWL XL LVL3 (GOWN DISPOSABLE) ×2 IMPLANT
GUIDEWIRE STR DUAL SENSOR (WIRE) ×2 IMPLANT
KIT TURNOVER KIT A (KITS) IMPLANT
LASER FIB FLEXIVA PULSE ID 365 (Laser) IMPLANT
LASER FIB FLEXIVA PULSE ID 550 (Laser) IMPLANT
LASER FIB FLEXIVA PULSE ID 910 (Laser) IMPLANT
LOOP CUT BIPOLAR 24F LRG (ELECTROSURGICAL) IMPLANT
MANIFOLD NEPTUNE II (INSTRUMENTS) ×2 IMPLANT
NDL SAFETY ECLIPSE 18X1.5 (NEEDLE) ×2 IMPLANT
NS IRRIG 1000ML POUR BTL (IV SOLUTION) IMPLANT
PACK CYSTO (CUSTOM PROCEDURE TRAY) ×2 IMPLANT
SYRINGE TOOMEY IRRIG 70ML (MISCELLANEOUS) IMPLANT
TRACTIP FLEXIVA PULS ID 200XHI (Laser) IMPLANT
TUBING CONNECTING 10 (TUBING) ×2 IMPLANT
TUBING UROLOGY SET (TUBING) ×2 IMPLANT

## 2024-06-02 NOTE — Transfer of Care (Signed)
 Immediate Anesthesia Transfer of Care Note  Patient: Dana [name]  Procedure(s) Performed: TURBT (TRANSURETHRAL RESECTION OF BLADDER TUMOR) (Bladder) CYSTOSCOPY (Bladder)  Patient Location: PACU  Anesthesia Type:General  Level of Consciousness: drowsy and patient cooperative  Airway & Oxygen Therapy: Patient Spontanous Breathing and Patient connected to face mask oxygen  Post-op Assessment: Report given to RN and Post -op Vital signs reviewed and stable  Post vital signs: Reviewed and stable  Last Vitals:  Vitals Value Taken Time  BP 142/79 06/02/24 1602  Temp 36.4 C 06/02/24 1602  Pulse 83 06/02/24 1604  Resp 10 06/02/24 1604  SpO2 100 % 06/02/24 1604  Vitals shown include unfiled device data.  Last Pain:  Vitals:   06/02/24 1356  TempSrc:   PainSc: 0-No pain         Complications: No notable events documented.

## 2024-06-02 NOTE — Anesthesia Procedure Notes (Signed)
 Procedure Name: Intubation Date/Time: 06/02/2024 3:00 PM  Performed by: Melodee Spruce, CRNAPre-anesthesia Checklist: Patient identified, Emergency Drugs available, Suction available and Patient being monitored Patient Re-evaluated:Patient Re-evaluated prior to induction Oxygen Delivery Method: Circle system utilized Preoxygenation: Pre-oxygenation with 100% oxygen Induction Type: IV induction Ventilation: Mask ventilation without difficulty Laryngoscope Size: Miller and 3 Grade View: Grade I Tube type: Oral Tube size: 7.0 mm Number of attempts: 1 Airway Equipment and Method: Stylet and Oral airway Placement Confirmation: ETT inserted through vocal cords under direct vision, positive ETCO2 and breath sounds checked- equal and bilateral Secured at: 21 cm Tube secured with: Tape Dental Injury: Teeth and Oropharynx as per pre-operative assessment

## 2024-06-02 NOTE — Op Note (Signed)
 Operative Note  Preoperative diagnosis:  1.  TA high-grade TCC  Postoperative diagnosis: 1.  Same  Procedure(s): 1.  Medium TURBT of 2.5 cm  Surgeon: Aimee Alf MD  Assistants:  None  Anesthesia:  General  Complications:  None  EBL: Minimal  Specimens: 1.  Bladder tumor  Drains/Catheters: 1.  18 French catheter  Intraoperative findings:   Did not visualize right ureteral orifice per previous dictation it was just anterior to the resection site and was unable to found today after thorough palpation and evaluation with a wire Due to this the 2.5 cm area of previous resection that was concerning was resected only the posterior part away from the suspected UO was cauterized. No other tumors or other abnormalities throughout the bladder.   Indication:  Dana [name] is a 89 y.o. female with with history of TA high-grade TCC recent cystoscopy concern for possible recurrence at the previous resection site.  All the risks, benefits were discussed with the patient to include but not limited to infection, pain, bleeding, damage to adjacent structures, need for further operations, adverse reaction to anesthesia and death.  Patient understands these risks and agrees to proceed with the operation as planned.    Description of procedure: After informed consent was obtained from the patient, the patient was taken to the operating room. General anesthesia was administered. The patient was placed in dorsal lithotomy position and prepped and draped in usual sterile fashion. Sequential compression devices were applied to lower extremities at the beginning of the case for DVT prophylaxis. Antibiotics were infused prior to surgery start time. A surgical time-out was performed to properly identify the patient, the surgery to be performed, and the surgical site.     We then passed the 21-French rigid cystoscope down the urethra and into the bladder under direct vision without any difficulty.  The anterior urethral was normal. . The bladder was inspected with 30 and 70 degree lenses. Once in the bladder, systematic evaluation of bladder revealed previous scarring at the right sided resection site the right ureteral orifice was unable to be visualized.  The left ureteral orifice was in orthotopic position.   The area was then palpated significantly with a wire but we were unable to identify the right ureteral orifice after prolonged period of evaluation.  The 30 degree and 70 degree were used to evaluate as well as a sensor wire.    We then removed the cystoscope and then passed down the 26 French resectoscope sheath down the urethra into the bladder under direct vision with the visual obturator. The tumor was resected down to muscle. The TUR bladder tumor chips were retrieved from the bladder and each region of resection was passed off the field as a separate specimen.  Hemostasis was achieved using electrocautery only the posterior aspects that were far away from where the suspected ureteral orifice would be were cauterized.. We then proceeded with removing the resectoscope and then placed in a 84fr Foley catheter. The patient tolerated the procedure well with no complication and was awoken from anesthesia and taken to recovery in stable condition.       Plan: Due to inability to find the UO the patient's family was counseled on this will get renal ultrasound in the near future.  Patient has follow-up with urology on 6/20 patient is okay to remove catheter at home this Friday.  Aimee Alf  [facility]

## 2024-06-02 NOTE — Anesthesia Preprocedure Evaluation (Addendum)
 Anesthesia Evaluation  Patient identified by MRN, date of birth, ID band Patient awake    Reviewed: Allergy & Precautions, NPO status , Patient's Chart, lab work & pertinent test results  History of Anesthesia Complications (+) PONV and history of anesthetic complications  Airway Mallampati: II  TM Distance: >3 FB Neck ROM: Full    Dental no notable dental hx. (+) Teeth Intact, Dental Advisory Given   Pulmonary COPD,  COPD inhaler, former smoker, PE (on Eliquis ) LUNG CA   Pulmonary exam normal breath sounds clear to auscultation       Cardiovascular hypertension, Pt. on medications (-) angina +CHF and + DOE  (-) Past MI Normal cardiovascular exam Rhythm:Regular Rate:Normal  11/2023 TTE 1. Left ventricular ejection fraction, by estimation, is >75%. The left  ventricle has hyperdynamic function. The left ventricle has no regional  wall motion abnormalities. Left ventricular diastolic parameters are  consistent with Grade I diastolic  dysfunction (impaired relaxation).   2. Right ventricular systolic function is normal. The right ventricular  size is mildly enlarged. There is normal pulmonary artery systolic  pressure. The estimated right ventricular systolic pressure is 23.5 mmHg.   3. The mitral valve is abnormal. Trivial mitral valve regurgitation.   4. The aortic valve was not well visualized. Aortic valve regurgitation  is trivial. Aortic valve sclerosis/calcification is present, without any  evidence of aortic stenosis.   5. The inferior vena cava is normal in size with <50% respiratory  variability, suggesting right atrial pressure of 8 mmHg.      Neuro/Psych  PSYCHIATRIC DISORDERS  Depression       GI/Hepatic Neg liver ROS,GERD  ,,  Endo/Other  negative endocrine ROS    Renal/GU Renal diseaseLab Results      Component                Value               Date                      K                        4.0                  06/01/2024                CO2                      25                  06/01/2024                BUN                      31 (H)              06/01/2024                CREATININE               0.99                06/01/2024                GFRNONAA                 55 (L)  06/01/2024                   Bladder tumor    Musculoskeletal  (+) Arthritis ,    Abdominal   Peds  Hematology  (+) Blood dyscrasia, anemia Lab Results      Component                Value               Date                      WBC                      7.2                 06/01/2024                HGB                      11.5 (L)            06/01/2024                HCT                      35.5 (L)            06/01/2024                MCV                      92.0                06/01/2024                PLT                      300                 06/01/2024              Anesthesia Other Findings All: Methotrexate, clindamycin , amoxicillin , Protonix   Reproductive/Obstetrics negative OB ROS                             Anesthesia Physical Anesthesia Plan  ASA: 3  Anesthesia Plan: General   Post-op Pain Management: Ofirmev  IV (intra-op)* and Precedex   Induction: Intravenous  PONV Risk Score and Plan: 3 and TIVA, Propofol  infusion, Treatment may vary due to age or medical condition and Ondansetron   Airway Management Planned: Oral ETT  Additional Equipment: None  Intra-op Plan:   Post-operative Plan: Extubation in OR  Informed Consent: I have reviewed the patients History and Physical, chart, labs and discussed the procedure including the risks, benefits and alternatives for the proposed anesthesia with the patient or authorized representative who has indicated his/her understanding and acceptance.     Dental advisory given  Plan Discussed with: CRNA and Surgeon  Anesthesia Plan Comments:        Anesthesia Quick Evaluation

## 2024-06-02 NOTE — Discharge Instructions (Addendum)
 Transurethral Resection of Bladder Tumor (TURBT) or Bladder Biopsy   Definition:  Transurethral Resection of the Bladder Tumor is a surgical procedure used to diagnose and remove tumors within the bladder. TURBT is the most common treatment for early stage bladder cancer.  General instructions:     Your recent bladder surgery requires very little post hospital care but some definite precautions.  Despite the fact that no skin incisions were used, the area around the bladder incisions are raw and covered with scabs to promote healing and prevent bleeding. Certain precautions are needed to insure that the scabs are not disturbed over the next 2-4 weeks while the healing proceeds.  Because the raw surface inside your bladder and the irritating effects of urine you may expect frequency of urination and/or urgency (a stronger desire to urinate) and perhaps even getting up at night more often. This will usually resolve or improve slowly over the healing period. You may see some blood in your urine over the first 6 weeks. Do not be alarmed, even if the urine was clear for a while. Get off your feet and drink lots of fluids until clearing occurs. If you start to pass clots or don't improve call us .  Catheter: Due to the nature of this surgery some patients need to be discharged with a catheter. If you are discharged with a catheter instructions on how to care for the catheter will be attached to your discharge paperwork. You will be called to schedule an appointment to remove the catheter.   Diet:  You may return to your normal diet immediately. Because of the raw surface of your bladder, alcohol, spicy foods, foods high in acid and drinks with caffeine may cause irritation or frequency and should be used in moderation. To keep your urine flowing freely and avoid constipation, drink plenty of fluids during the day (8-10 glasses). Tip: Avoid cranberry juice because it is very acidic.  You may remove your  catheter on Friday. Do this by removing 10cc of normal saline from the side connection at the end  of the foley   Activity:  Do not lift more than 10 LBS for 2 weeks Do not strain with bowel movents.  Your physical activity doesn't need to be restricted. However, if you are very active, you may see some blood in the urine. We suggest that you reduce your activity under the circumstances until the bleeding has stopped.  Bowels:  It is important to keep your bowels regular during the postoperative period. Straining with bowel movements can cause bleeding. A bowel movement every other day is reasonable. Use a mild laxative if needed, such as milk of magnesia 2-3 tablespoons, or 2 Dulcolax tablets. Call if you continue to have problems. If you had been taking narcotics for pain, before, during or after your surgery, you may be constipated. Take a laxative if necessary.    Medication:  You should resume your pre-surgery medications unless told not to. In addition you may be given an antibiotic to prevent or treat infection. Antibiotics are not always necessary. All medication should be taken as prescribed until the bottles are finished unless you are having an unusual reaction to one of the drugs.

## 2024-06-02 NOTE — Anesthesia Postprocedure Evaluation (Signed)
 Anesthesia Post Note  Patient: Dana [name]  Procedure(s) Performed: TURBT (TRANSURETHRAL RESECTION OF BLADDER TUMOR) (Bladder) CYSTOSCOPY (Bladder)     Patient location during evaluation: PACU Anesthesia Type: General Level of consciousness: awake and alert Pain management: pain level controlled Vital Signs Assessment: post-procedure vital signs reviewed and stable Respiratory status: spontaneous breathing, nonlabored ventilation, respiratory function stable and patient connected to nasal cannula oxygen Cardiovascular status: blood pressure returned to baseline and stable Postop Assessment: no apparent nausea or vomiting Anesthetic complications: no  No notable events documented.  Last Vitals:  Vitals:   06/02/24 1645 06/02/24 1656  BP: (!) 151/89 (!) 149/84  Pulse: 88 89  Resp: 16 19  Temp: 36.6 C   SpO2:  98%    Last Pain:  Vitals:   06/02/24 1656  TempSrc:   PainSc: 0-No pain                 Rosalita Combe

## 2024-06-03 ENCOUNTER — Encounter (HOSPITAL_COMMUNITY): Payer: Self-pay | Admitting: Urology

## 2024-06-04 LAB — SURGICAL PATHOLOGY

## 2024-06-06 ENCOUNTER — Inpatient Hospital Stay (HOSPITAL_COMMUNITY)

## 2024-06-06 ENCOUNTER — Emergency Department (HOSPITAL_COMMUNITY)

## 2024-06-06 ENCOUNTER — Inpatient Hospital Stay (HOSPITAL_COMMUNITY)
Admission: EM | Admit: 2024-06-06 | Discharge: 2024-06-12 | DRG: 389 | Disposition: A | Discharge status: Skilled Nursing Facility | Attending: Internal Medicine | Admitting: Internal Medicine

## 2024-06-06 DIAGNOSIS — N390 Urinary tract infection, site not specified: Secondary | ICD-10-CM | POA: Diagnosis not present

## 2024-06-06 DIAGNOSIS — C679 Malignant neoplasm of bladder, unspecified: Secondary | ICD-10-CM | POA: Diagnosis present

## 2024-06-06 DIAGNOSIS — W19XXXA Unspecified fall, initial encounter: Secondary | ICD-10-CM

## 2024-06-06 DIAGNOSIS — I5030 Unspecified diastolic (congestive) heart failure: Secondary | ICD-10-CM | POA: Diagnosis present

## 2024-06-06 DIAGNOSIS — J449 Chronic obstructive pulmonary disease, unspecified: Secondary | ICD-10-CM | POA: Diagnosis not present

## 2024-06-06 DIAGNOSIS — M069 Rheumatoid arthritis, unspecified: Secondary | ICD-10-CM | POA: Diagnosis not present

## 2024-06-06 DIAGNOSIS — I499 Cardiac arrhythmia, unspecified: Secondary | ICD-10-CM | POA: Diagnosis not present

## 2024-06-06 DIAGNOSIS — Z743 Need for continuous supervision: Secondary | ICD-10-CM | POA: Diagnosis not present

## 2024-06-06 DIAGNOSIS — K5641 Fecal impaction: Principal | ICD-10-CM | POA: Diagnosis present

## 2024-06-06 DIAGNOSIS — R000 Tachycardia, unspecified: Secondary | ICD-10-CM | POA: Diagnosis present

## 2024-06-06 DIAGNOSIS — E739 Lactose intolerance, unspecified: Secondary | ICD-10-CM | POA: Diagnosis not present

## 2024-06-06 DIAGNOSIS — Z86711 Personal history of pulmonary embolism: Secondary | ICD-10-CM | POA: Diagnosis not present

## 2024-06-06 DIAGNOSIS — M810 Age-related osteoporosis without current pathological fracture: Secondary | ICD-10-CM | POA: Diagnosis present

## 2024-06-06 DIAGNOSIS — Z87442 Personal history of urinary calculi: Secondary | ICD-10-CM | POA: Diagnosis not present

## 2024-06-06 DIAGNOSIS — Z8619 Personal history of other infectious and parasitic diseases: Secondary | ICD-10-CM | POA: Diagnosis not present

## 2024-06-06 DIAGNOSIS — F32A Depression, unspecified: Secondary | ICD-10-CM | POA: Diagnosis not present

## 2024-06-06 DIAGNOSIS — M6281 Muscle weakness (generalized): Secondary | ICD-10-CM | POA: Diagnosis not present

## 2024-06-06 DIAGNOSIS — E441 Mild protein-calorie malnutrition: Secondary | ICD-10-CM | POA: Diagnosis not present

## 2024-06-06 DIAGNOSIS — D649 Anemia, unspecified: Secondary | ICD-10-CM | POA: Diagnosis not present

## 2024-06-06 DIAGNOSIS — Z66 Do not resuscitate: Secondary | ICD-10-CM | POA: Diagnosis present

## 2024-06-06 DIAGNOSIS — R059 Cough, unspecified: Secondary | ICD-10-CM | POA: Diagnosis not present

## 2024-06-06 DIAGNOSIS — Z90710 Acquired absence of both cervix and uterus: Secondary | ICD-10-CM | POA: Diagnosis not present

## 2024-06-06 DIAGNOSIS — Z7401 Bed confinement status: Secondary | ICD-10-CM | POA: Diagnosis not present

## 2024-06-06 DIAGNOSIS — M3500 Sicca syndrome, unspecified: Secondary | ICD-10-CM | POA: Diagnosis not present

## 2024-06-06 DIAGNOSIS — R0902 Hypoxemia: Secondary | ICD-10-CM | POA: Diagnosis not present

## 2024-06-06 DIAGNOSIS — K521 Toxic gastroenteritis and colitis: Secondary | ICD-10-CM | POA: Insufficient documentation

## 2024-06-06 DIAGNOSIS — M19042 Primary osteoarthritis, left hand: Secondary | ICD-10-CM | POA: Diagnosis present

## 2024-06-06 DIAGNOSIS — Z90722 Acquired absence of ovaries, bilateral: Secondary | ICD-10-CM

## 2024-06-06 DIAGNOSIS — R531 Weakness: Secondary | ICD-10-CM | POA: Diagnosis not present

## 2024-06-06 DIAGNOSIS — Z881 Allergy status to other antibiotic agents status: Secondary | ICD-10-CM

## 2024-06-06 DIAGNOSIS — W1839XA Other fall on same level, initial encounter: Secondary | ICD-10-CM | POA: Diagnosis present

## 2024-06-06 DIAGNOSIS — E860 Dehydration: Secondary | ICD-10-CM | POA: Diagnosis not present

## 2024-06-06 DIAGNOSIS — Z85118 Personal history of other malignant neoplasm of bronchus and lung: Secondary | ICD-10-CM | POA: Diagnosis not present

## 2024-06-06 DIAGNOSIS — R2681 Unsteadiness on feet: Secondary | ICD-10-CM | POA: Diagnosis not present

## 2024-06-06 DIAGNOSIS — R109 Unspecified abdominal pain: Secondary | ICD-10-CM | POA: Diagnosis not present

## 2024-06-06 DIAGNOSIS — Z87891 Personal history of nicotine dependence: Secondary | ICD-10-CM

## 2024-06-06 DIAGNOSIS — R197 Diarrhea, unspecified: Principal | ICD-10-CM | POA: Diagnosis present

## 2024-06-06 DIAGNOSIS — R451 Restlessness and agitation: Secondary | ICD-10-CM | POA: Diagnosis not present

## 2024-06-06 DIAGNOSIS — R410 Disorientation, unspecified: Secondary | ICD-10-CM | POA: Diagnosis not present

## 2024-06-06 DIAGNOSIS — R748 Abnormal levels of other serum enzymes: Secondary | ICD-10-CM | POA: Diagnosis present

## 2024-06-06 DIAGNOSIS — K219 Gastro-esophageal reflux disease without esophagitis: Secondary | ICD-10-CM | POA: Diagnosis present

## 2024-06-06 DIAGNOSIS — I110 Hypertensive heart disease with heart failure: Secondary | ICD-10-CM | POA: Diagnosis present

## 2024-06-06 DIAGNOSIS — T83098D Other mechanical complication of other indwelling urethral catheter, subsequent encounter: Secondary | ICD-10-CM | POA: Diagnosis not present

## 2024-06-06 DIAGNOSIS — I10 Essential (primary) hypertension: Secondary | ICD-10-CM | POA: Diagnosis not present

## 2024-06-06 DIAGNOSIS — K8050 Calculus of bile duct without cholangitis or cholecystitis without obstruction: Secondary | ICD-10-CM | POA: Diagnosis present

## 2024-06-06 DIAGNOSIS — R7989 Other specified abnormal findings of blood chemistry: Secondary | ICD-10-CM | POA: Diagnosis not present

## 2024-06-06 DIAGNOSIS — Z1152 Encounter for screening for COVID-19: Secondary | ICD-10-CM

## 2024-06-06 DIAGNOSIS — C3491 Malignant neoplasm of unspecified part of right bronchus or lung: Secondary | ICD-10-CM | POA: Diagnosis not present

## 2024-06-06 DIAGNOSIS — Z0389 Encounter for observation for other suspected diseases and conditions ruled out: Secondary | ICD-10-CM | POA: Diagnosis not present

## 2024-06-06 DIAGNOSIS — R918 Other nonspecific abnormal finding of lung field: Secondary | ICD-10-CM | POA: Diagnosis not present

## 2024-06-06 DIAGNOSIS — K8020 Calculus of gallbladder without cholecystitis without obstruction: Secondary | ICD-10-CM | POA: Diagnosis not present

## 2024-06-06 DIAGNOSIS — R6889 Other general symptoms and signs: Secondary | ICD-10-CM | POA: Diagnosis not present

## 2024-06-06 DIAGNOSIS — M19041 Primary osteoarthritis, right hand: Secondary | ICD-10-CM | POA: Diagnosis present

## 2024-06-06 DIAGNOSIS — M19031 Primary osteoarthritis, right wrist: Secondary | ICD-10-CM | POA: Diagnosis present

## 2024-06-06 DIAGNOSIS — R339 Retention of urine, unspecified: Secondary | ICD-10-CM | POA: Diagnosis not present

## 2024-06-06 DIAGNOSIS — Z888 Allergy status to other drugs, medicaments and biological substances status: Secondary | ICD-10-CM

## 2024-06-06 DIAGNOSIS — I5032 Chronic diastolic (congestive) heart failure: Secondary | ICD-10-CM | POA: Diagnosis not present

## 2024-06-06 DIAGNOSIS — K5900 Constipation, unspecified: Secondary | ICD-10-CM | POA: Diagnosis not present

## 2024-06-06 DIAGNOSIS — Z79899 Other long term (current) drug therapy: Secondary | ICD-10-CM | POA: Diagnosis not present

## 2024-06-06 DIAGNOSIS — M19032 Primary osteoarthritis, left wrist: Secondary | ICD-10-CM | POA: Diagnosis present

## 2024-06-06 LAB — URINALYSIS, ROUTINE W REFLEX MICROSCOPIC
Bilirubin Urine: NEGATIVE
Glucose, UA: NEGATIVE mg/dL
Ketones, ur: NEGATIVE mg/dL
Nitrite: NEGATIVE
Protein, ur: NEGATIVE mg/dL
Specific Gravity, Urine: 1.012 (ref 1.005–1.030)
pH: 5 (ref 5.0–8.0)

## 2024-06-06 LAB — C DIFFICILE QUICK SCREEN W PCR REFLEX
C Diff antigen: NEGATIVE
C Diff interpretation: NOT DETECTED
C Diff toxin: NEGATIVE

## 2024-06-06 LAB — LIPASE, BLOOD: Lipase: 298 U/L — ABNORMAL HIGH (ref 11–51)

## 2024-06-06 LAB — COMPREHENSIVE METABOLIC PANEL WITH GFR
ALT: 46 U/L — ABNORMAL HIGH (ref 0–44)
AST: 112 U/L — ABNORMAL HIGH (ref 15–41)
Albumin: 3 g/dL — ABNORMAL LOW (ref 3.5–5.0)
Alkaline Phosphatase: 56 U/L (ref 38–126)
Anion gap: 9 (ref 5–15)
BUN: 29 mg/dL — ABNORMAL HIGH (ref 8–23)
CO2: 26 mmol/L (ref 22–32)
Calcium: 9.6 mg/dL (ref 8.9–10.3)
Chloride: 101 mmol/L (ref 98–111)
Creatinine, Ser: 0.69 mg/dL (ref 0.44–1.00)
GFR, Estimated: >60 mL/min (ref >60)
Glucose, Bld: 98 mg/dL (ref 70–99)
Potassium: 3.7 mmol/L (ref 3.5–5.1)
Sodium: 136 mmol/L (ref 135–145)
Total Bilirubin: 1.3 mg/dL — ABNORMAL HIGH (ref 0.0–1.2)
Total Protein: 5.5 g/dL — ABNORMAL LOW (ref 6.5–8.1)

## 2024-06-06 LAB — CBC WITH DIFFERENTIAL/PLATELET
Abs Immature Granulocytes: 0.04 10*3/uL (ref 0.00–0.07)
Basophils Absolute: 0 10*3/uL (ref 0.0–0.1)
Basophils Relative: 0 %
Eosinophils Absolute: 0 10*3/uL (ref 0.0–0.5)
Eosinophils Relative: 0 %
HCT: 31.7 % — ABNORMAL LOW (ref 36.0–46.0)
Hemoglobin: 10.7 g/dL — ABNORMAL LOW (ref 12.0–15.0)
Immature Granulocytes: 0 %
Lymphocytes Relative: 5 %
Lymphs Abs: 0.5 10*3/uL — ABNORMAL LOW (ref 0.7–4.0)
MCH: 30.1 pg (ref 26.0–34.0)
MCHC: 33.8 g/dL (ref 30.0–36.0)
MCV: 89 fL (ref 80.0–100.0)
Monocytes Absolute: 0.8 10*3/uL (ref 0.1–1.0)
Monocytes Relative: 7 %
Neutro Abs: 9.1 10*3/uL — ABNORMAL HIGH (ref 1.7–7.7)
Neutrophils Relative %: 88 %
Platelets: 286 10*3/uL (ref 150–400)
RBC: 3.56 MIL/uL — ABNORMAL LOW (ref 3.87–5.11)
RDW: 17.1 % — ABNORMAL HIGH (ref 11.5–15.5)
WBC: 10.5 10*3/uL (ref 4.0–10.5)
nRBC: 0 % (ref 0.0–0.2)

## 2024-06-06 LAB — PHOSPHORUS: Phosphorus: 2.5 mg/dL (ref 2.5–4.6)

## 2024-06-06 LAB — RESP PANEL BY RT-PCR (RSV, FLU A&B, COVID)  RVPGX2
Influenza A by PCR: NEGATIVE
Influenza B by PCR: NEGATIVE
Resp Syncytial Virus by PCR: NEGATIVE
SARS Coronavirus 2 by RT PCR: NEGATIVE

## 2024-06-06 LAB — MAGNESIUM: Magnesium: 1.3 mg/dL — ABNORMAL LOW (ref 1.7–2.4)

## 2024-06-06 LAB — LACTIC ACID, PLASMA
Lactic Acid, Venous: 1.2 mmol/L (ref 0.5–1.9)
Lactic Acid, Venous: 1.9 mmol/L (ref 0.5–1.9)

## 2024-06-06 MED ORDER — ARFORMOTEROL TARTRATE 15 MCG/2ML IN NEBU
15.0000 ug | INHALATION_SOLUTION | Freq: Two times a day (BID) | RESPIRATORY_TRACT | Status: DC
  Administered 2024-06-07 – 2024-06-12 (×10): 15 ug via RESPIRATORY_TRACT
  Filled 2024-06-06 (×13): qty 2

## 2024-06-06 MED ORDER — ACETAMINOPHEN 325 MG PO TABS
650.0000 mg | ORAL_TABLET | Freq: Four times a day (QID) | ORAL | Status: DC | PRN
  Administered 2024-06-07 – 2024-06-11 (×4): 650 mg via ORAL
  Filled 2024-06-06 (×4): qty 2

## 2024-06-06 MED ORDER — MIRTAZAPINE 15 MG PO TABS
15.0000 mg | ORAL_TABLET | Freq: Every day | ORAL | Status: DC
  Administered 2024-06-06 – 2024-06-11 (×6): 15 mg via ORAL
  Filled 2024-06-06: qty 2
  Filled 2024-06-06: qty 1
  Filled 2024-06-06 (×5): qty 2

## 2024-06-06 MED ORDER — METRONIDAZOLE 500 MG/100ML IV SOLN
500.0000 mg | Freq: Two times a day (BID) | INTRAVENOUS | Status: DC
  Administered 2024-06-06 – 2024-06-09 (×7): 500 mg via INTRAVENOUS
  Filled 2024-06-06 (×8): qty 100

## 2024-06-06 MED ORDER — LORATADINE 10 MG PO TABS: 10.0000 mg | ORAL_TABLET | Freq: Every day | ORAL | Status: DC | PRN

## 2024-06-06 MED ORDER — MAGNESIUM SULFATE 2 GM/50ML IV SOLN
2.0000 g | Freq: Once | INTRAVENOUS | Status: DC
  Filled 2024-06-06: qty 50

## 2024-06-06 MED ORDER — SODIUM CHLORIDE 0.9 % IV SOLN
1.0000 g | INTRAVENOUS | Status: DC
  Administered 2024-06-07 – 2024-06-09 (×3): 1 g via INTRAVENOUS
  Filled 2024-06-06 (×3): qty 10

## 2024-06-06 MED ORDER — OYSTER SHELL CALCIUM/D3 500-5 MG-MCG PO TABS
1.0000 | ORAL_TABLET | Freq: Two times a day (BID) | ORAL | Status: DC
  Administered 2024-06-07 – 2024-06-12 (×11): 1 via ORAL
  Filled 2024-06-06 (×12): qty 1

## 2024-06-06 MED ORDER — POTASSIUM CHLORIDE CRYS ER 20 MEQ PO TBCR: 20.0000 meq | EXTENDED_RELEASE_TABLET | Freq: Once | ORAL | Status: AC

## 2024-06-06 MED ORDER — ENOXAPARIN SODIUM 30 MG/0.3ML IJ SOSY
30.0000 mg | PREFILLED_SYRINGE | INTRAMUSCULAR | Status: DC
  Administered 2024-06-07 – 2024-06-11 (×5): 30 mg via SUBCUTANEOUS
  Filled 2024-06-06 (×6): qty 0.3

## 2024-06-06 MED ORDER — POTASSIUM CHLORIDE CRYS ER 20 MEQ PO TBCR
20.0000 meq | EXTENDED_RELEASE_TABLET | Freq: Once | ORAL | Status: DC
  Filled 2024-06-06: qty 1

## 2024-06-06 MED ORDER — GADOBUTROL 1 MMOL/ML IV SOLN: 5.0000 mL | Freq: Once | INTRAVENOUS | Status: AC | PRN

## 2024-06-06 MED ORDER — ONDANSETRON HCL 4 MG PO TABS
4.0000 mg | ORAL_TABLET | Freq: Four times a day (QID) | ORAL | Status: DC | PRN
Start: 2024-06-06 — End: 2024-06-12

## 2024-06-06 MED ORDER — ONDANSETRON HCL 4 MG/2ML IJ SOLN
4.0000 mg | Freq: Four times a day (QID) | INTRAMUSCULAR | Status: DC | PRN
Start: 2024-06-06 — End: 2024-06-12

## 2024-06-06 MED ORDER — SODIUM CHLORIDE 0.9 % IV SOLN: INTRAVENOUS | Status: AC

## 2024-06-06 MED ORDER — LOPERAMIDE HCL 2 MG PO CAPS
2.0000 mg | ORAL_CAPSULE | Freq: Four times a day (QID) | ORAL | Status: DC | PRN
  Administered 2024-06-07 (×2): 2 mg via ORAL
  Filled 2024-06-06 (×3): qty 1

## 2024-06-06 MED ORDER — BISMUTH SUBSALICYLATE 262 MG PO CHEW: 262.0000 mg | CHEWABLE_TABLET | ORAL | Status: DC | PRN

## 2024-06-06 MED ORDER — SODIUM CHLORIDE 0.9 % IV SOLN
1.0000 g | Freq: Once | INTRAVENOUS | Status: AC
  Filled 2024-06-06: qty 10

## 2024-06-06 MED ORDER — ACETAMINOPHEN 650 MG RE SUPP: 650.0000 mg | Freq: Four times a day (QID) | RECTAL | Status: DC | PRN

## 2024-06-06 MED ORDER — LACTATED RINGERS IV BOLUS: 1000.0000 mL | Freq: Once | INTRAVENOUS | Status: AC

## 2024-06-06 MED ORDER — UMECLIDINIUM BROMIDE 62.5 MCG/ACT IN AEPB
1.0000 | INHALATION_SPRAY | Freq: Every day | RESPIRATORY_TRACT | Status: DC
  Administered 2024-06-08 – 2024-06-12 (×4): 1 via RESPIRATORY_TRACT
  Filled 2024-06-06: qty 7

## 2024-06-06 MED ORDER — ONDANSETRON HCL 4 MG/2ML IJ SOLN: 4.0000 mg | Freq: Once | INTRAMUSCULAR | Status: DC | PRN

## 2024-06-06 MED ORDER — MAGNESIUM SULFATE 2 GM/50ML IV SOLN: 2.0000 g | Freq: Once | INTRAVENOUS | Status: AC

## 2024-06-06 NOTE — ED Notes (Signed)
 Stool specimen sent to lab.

## 2024-06-06 NOTE — ED Triage Notes (Signed)
 Patient BIB EMS for fall. Patient was ambulating to the bathroom when her legs gave out and she fell. Fell down on the toilett and landed on her arms. Denies hitting her head, or LOC. Patient states her legs are weak. States she gets like this when she is dehyrated. Patient also has been having loose stool for about 5 days. Patient had loose BM during triage. Patient denies pain. Alert and oriented x 4. Patient did have outpatient surgery her at [facility] for bladder biopsy this past Monday. Member has lung cancer.

## 2024-06-06 NOTE — ED Notes (Signed)
 Pt called out twice to be changed because of BM. Writer and RN went in pt room to clean pt and pt did not have BM both times so was unable to collect stool sample.

## 2024-06-06 NOTE — ED Triage Notes (Signed)
 VS per EMS:  20g L AC  145/80 100 96% RA CBG 124

## 2024-06-06 NOTE — H&P (Signed)
 History and Physical    Patient: Dana [name]:[id number] DOB: 1935-09-22 DOA: 06/06/2024 DOS: the patient was seen and examined on 06/06/2024 PCP: [name], MD  Patient coming from: Home  Chief Complaint: Near fall, generalized weakness and diarrhea.  HPI: Dana [name] is a 89 y.o. female with medical history significant of anemia, osteoarthritis, CAP, diastolic CHF, COPD, ILD, depression, GERD, nephrolithiasis, lung cancer, pulmonary embolism, hypertension, osteoporosis, skin rash, Sjogren syndrome, history of C. difficile x 2 who according to the patient had a near fall at the bathroom injuring her right forearm and right pretibial area.  Denied any head trauma or LOC.  No prodromal symptoms.  She has been having multiple episodes of diarrhea after starting antibiotics following transrectal resection of a bladder tumor 4 days ago with urology.   No abdominal pain, nausea, emesis, constipation, melena or hematochezia.  No flank pain, dysuria, frequency or hematuria. She denied fever, chills, rhinorrhea, sore throat, wheezing or hemoptysis.  No chest pain, palpitations, diaphoresis, PND, orthopnea or pitting edema of the lower extremities.  No polyuria, polydipsia, polyphagia or blurred vision.   Lab work: Urinalysis with moderate hemoglobin, trace leukocyte esterase and a few bacteria.  C. difficile antigen and toxin were negative.  CBC showed white count of 10.5, hemoglobin 10.7 g/dL platelets 811.  Coronavirus, influenza and RSV PCR were negative.  Lactic acid was normal.  Lipase was 298 units/L.  Magnesium  1.3 mg/dL.  CMP showed normal electrolytes and glucose.  BUN was 29, creatinine 0.69 and total bilirubin 1.3 mg/dL.  Total protein is 5.5 and albumin 3.0 g/dL.  AST 812 and ALT 46 units/L.  Imaging: Portable chest radiograph with no acute cardiopulmonary abnormality.  Similar appearance of the right lung status post treated for right pulmonary malignancy.  Ultrasound of the abdomen  Limited visualization of the gallbladder.  No gallstone identified.  Mild dilatation of the CBD measuring 9 mm.  MRCP suggested.  ED course: Initial vital signs were temperature 99.1 F, pulse 71, respiration 24, BP 145/83 mmHg O2 sat 97% on room air.  The patient received LR 1000 mL liter bolus and ceftriaxone  1 g IVPB.   Review of Systems: As mentioned in the history of present illness. All other systems reviewed and are negative. Past Medical History:  Diagnosis Date   Anemia    Arthritis    HANDS,  WRISTS   CAP (community acquired pneumonia) 12/25/2022   CHF (congestive heart failure) (HCC)    COPD GOLD 0/ lama/laba responsive 07/11/2019   Quit smoking 1979  07/05/2016  Walked RA x 3 laps @ 185 ft each stopped due to  End of study, nl pace no desat or sob   - Spirometry 07/05/2016  wnl p am spiriva    - CTa chest 07/05/2016 neg pe/ neg ILD, R infrahilar density c/w lung ca vs post RT scar    Chest CT w contrast  09/12/18  Stable CT of the chest  - 11/25/2018   Walked RA  2 laps @ 25ft each @ moderate pace  stopped due to  desats to 86% c   Depression 04/04/2017   Dyspnea    Dyspnea on exertion    Encounter for therapeutic drug monitoring 10/01/2016   GERD (gastroesophageal reflux disease)    at times   Heart failure with preserved left ventricular function (HFpEF) (HCC) 11/24/2023   Hemorrhoid    History of anal fissures    History of kidney stones    History of lung cancer  ONCOLOGIST--  DR Marguerita Shih--  LAST CT ,  NO RECURRENCE OR METS   DX JAN 2010 --  STAGE IIIA  NON-SMALL CELL ADENOCARCINOMA (RIGHT MIDDLE LOBE)---  S/P  CHEMORADIATIO THERAPY  (COMPLETE 03-21-2009)   History of pulmonary embolism    HTN (hypertension) 10/10/2017   Imbalance 06/04/2017   lung ca dx'd 2010   Normal cardiac stress test    2007  PER PT   Osteoporosis    PONV (postoperative nausea and vomiting)    Rash, skin    RIGHT FOREARM/ HAND   Right wrist pain    MASS   Wears glasses    Past Surgical History:   Procedure Laterality Date   ANAL FISSURE REPAIR  07/09/2011   INTERNAL SPHINCTEROTOMY   BENIGN EXCISION LEFT BREAST CENTRAL DUCT  02/1999   BREAST BIOPSY  01/31/2009   benign   BREAST EXCISIONAL BIOPSY Left 2004   benign   CHEST TUBE INSERTION Right 07/14/2014   Procedure: INSERTION PLEURAL DRAINAGE CATHETER RIGHT CHEST;  Surgeon: Norita Beauvais, MD;  Location: [facility] OR;  Service: Thoracic;  Laterality: Right;   CYSTOSCOPY W/ RETROGRADES N/A 06/02/2024   Procedure: CYSTOSCOPY;  Surgeon: Thelbert Finner, MD;  Location: [facility] ORS;  Service: Urology;  Laterality: N/A;   DIRECT LARYNGOSCOPY WITH RADIAESSE INJECTION Bilateral 08/05/2023   Procedure: SUSPENDED MIRCO DIRECT LARYNGOSCOPY WITH PROLARYN INJECTION;  Surgeon: Virgina Grills, MD;  Location: [facility] OR;  Service: ENT;  Laterality: Bilateral;   EXCISION RIGHT WRIST MASS  2012   EXTRACORPOREAL SHOCK WAVE LITHOTRIPSY Left 06/01/2019   Procedure: EXTRACORPOREAL SHOCK WAVE LITHOTRIPSY (ESWL);  Surgeon: Marco Severs, MD;  Location: [facility] ORS;  Service: Urology;  Laterality: Left;   EYE SURGERY Bilateral    congenital spot removed and also cataract removed   IR ANGIOGRAM EXTREMITY RIGHT  07/09/2023   IR ANGIOGRAM SELECTIVE EACH ADDITIONAL VESSEL  07/09/2023   IR RADIOLOGIST EVAL & MGMT  05/28/2023   IR US  GUIDE VASC ACCESS RIGHT  07/09/2023   KNEE ARTHROSCOPY Right 1999   MASS EXCISION Right 03/11/2014   Procedure: RIGHT WRIST DEEP MASS EXCISION WITH CULTURE AND BIOSPY;  Surgeon: Shellie Dials, MD;  Location: [facility];  Service: Orthopedics;  Laterality: Right;   MICROLARYNGOSCOPY W/VOCAL CORD INJECTION Bilateral 02/08/2021   Procedure: MICROLARYNGOSCOPY WITH VOCAL CORD INJECTION;  Surgeon: Virgina Grills, MD;  Location: [facility] OR;  Service: ENT;  Laterality: Bilateral;   REMOVAL OF PLEURAL DRAINAGE CATHETER Right 10/14/2014   Procedure: REMOVAL OF PLEURAL DRAINAGE CATHETER;  Surgeon: Norita Beauvais, MD;  Location: [facility] OR;   Service: Thoracic;  Laterality: Right;   TALC  PLEURODESIS Right 09/16/2014   Procedure: TALC  PLEURADESIS/ slurry;  Surgeon: Norita Beauvais, MD;  Location: [facility] OR;  Service: Thoracic;  Laterality: Right;   THORACOSCOPY  01/26/2009   w/   lung/  node biopsy's   THUMB ARTHROSCOPY Left    - removed bone spur   TONSILLECTOMY  AS CHILD   TOTAL ABDOMINAL HYSTERECTOMY W/ BILATERAL SALPINGOOPHORECTOMY  1982   W/  APPENDECTOMY   TRANSTHORACIC ECHOCARDIOGRAM  12/23/2008   NORMAL LV/  EF 65-70%/  MILD MR  &  TR   TRANSURETHRAL RESECTION OF BLADDER TUMOR N/A 06/02/2024   Procedure: TURBT (TRANSURETHRAL RESECTION OF BLADDER TUMOR);  Surgeon: Thelbert Finner, MD;  Location: [facility];  Service: Urology;  Laterality: N/A;   VAULT SUSPENSION PLUS CYSTOCELE REPAIR WITH GRAFT  06/13/2010   Social History:  reports that she quit smoking  about 46 years ago. Her smoking use included cigarettes. She started smoking about 76 years ago. She has a 30 pack-year smoking history. She has never been exposed to tobacco smoke. She has never used smokeless tobacco. She reports that she does not drink alcohol and does not use drugs.  Allergies  Allergen Reactions   Methotrexate Shortness Of Breath   Clindamycin  Hcl Diarrhea and Other (See Comments)   Lactose Intolerance (Gi)     Upset stomach   Amoxicillin Diarrhea    Hx of C Diff    Protonix  [Pantoprazole  Sodium] Rash    Family History  Problem Relation Age of Onset   Cancer Father        bladder   Cancer Son        kidney - removed as a teenager    Prior to Admission medications   Medication Sig Start Date End Date Taking? Authorizing Provider  acetaminophen  (TYLENOL ) 650 MG CR tablet Take 650 mg by mouth in the morning.    [provider]  AMBULATORY NON FORMULARY MEDICATION R knee brace Dispense 1 Dx code: M17.11 Use as needed 09/30/23   Corey, Evan S, MD  bismuth subsalicylate (PEPTO BISMOL) 262 MG chewable tablet Chew 262 mg by mouth as  needed for indigestion or diarrhea or loose stools.    [provider]  Calcium  Carbonate-Vit D-Min (CALCIUM  600+D PLUS MINERALS) 600-400 MG-UNIT TABS Take 1 tablet by mouth 2 (two) times daily.    [provider]  erlotinib  (TARCEVA ) 100 MG tablet Take 1 tablet (100 mg total) by mouth daily. Patient taking differently: Take 100 mg by mouth daily in the afternoon. 03/11/24   Marlene Simas, MD  lactase (LACTAID) 3000 units tablet Take 3,000 Units by mouth daily as needed (when eating dairy products).    [provider]  loperamide (IMODIUM A-D) 2 MG tablet Take 2 mg by mouth as needed for diarrhea or loose stools.    [provider]  loratadine  (CLARITIN ) 10 MG tablet Take 10 mg by mouth daily as needed for allergies.    [provider]  methocarbamol  (ROBAXIN ) 750 MG tablet Take 1 tablet (750 mg total) by mouth 4 (four) times daily for 20 doses. 06/02/24 06/07/24  Thelbert Finner, MD  mirtazapine  (REMERON ) 15 MG tablet TAKE 1 TABLET BY MOUTH AT BEDTIME 05/16/24   Heilingoetter, Cassandra L, PA-C  Multiple Vitamins-Minerals (PRESERVISION AREDS 2 PO) Take 1 tablet by mouth in the morning.    [provider]  Polyethyl Glycol-Propyl Glycol (SYSTANE OP) Place 1 drop into both eyes in the morning and at bedtime.    [provider]  polyethylene glycol (MIRALAX ) 17 g packet Take 17 g by mouth daily. 06/02/24   Thelbert Finner, MD  Probiotic Product (PROBIOTIC ACIDOPHILUS) CHEW Chew 1 tablet by mouth in the morning.    [provider]  sulfamethoxazole -trimethoprim  (BACTRIM  DS) 800-160 MG tablet Take 1 tablet by mouth 2 (two) times daily. 05/25/24   [provider]  Tiotropium Bromide -Olodaterol (STIOLTO RESPIMAT ) 2.5-2.5 MCG/ACT AERS INHALE 2 PUFFS BY MOUTH ONCE DAILY 07/10/22   Diamond Formica, MD    Physical Exam: Vitals:   06/06/24 1115 06/06/24 1208 06/06/24 1230 06/06/24 1354  BP: (!) 152/85  (!) 176/93   Pulse:  (!) 111  (!) 106   Resp: (!) 24  18   Temp:  99.6 F (37.6 C)  99.2 F (37.3 C)  TempSrc:  Rectal  Oral  SpO2: (!) 64%  99%   Weight:      Height:       Physical Exam Vitals and nursing note reviewed.  Constitutional:      General: She is awake. She is not in acute distress.    Appearance: She is ill-appearing.  HENT:     Head: Normocephalic.     Nose: No rhinorrhea.     Mouth/Throat:     Mouth: Mucous membranes are dry.   Eyes:     General: No scleral icterus.    Pupils: Pupils are equal, round, and reactive to light.   Neck:     Vascular: No JVD.   Cardiovascular:     Rate and Rhythm: Regular rhythm. Tachycardia present.     Heart sounds: S1 normal and S2 normal.  Pulmonary:     Breath sounds: No wheezing, rhonchi or rales.  Abdominal:     General: There is no distension.     Palpations: Abdomen is soft.     Tenderness: There is no abdominal tenderness. There is no right CVA tenderness or left CVA tenderness.   Musculoskeletal:     Cervical back: Neck supple.     Right lower leg: No edema.     Left lower leg: No edema.   Skin:    General: Skin is warm and dry.   Neurological:     General: No focal deficit present.     Mental Status: She is alert and oriented to person, place, and time.   Psychiatric:        Mood and Affect: Mood normal.        Behavior: Behavior normal. Behavior is cooperative.     Data Reviewed:  Results are pending, will review when available.   11/25/2023 echocardiogram report. IMPRESSIONS:   1. Left ventricular ejection fraction, by estimation, is >75%. The left  ventricle has hyperdynamic function. The left ventricle has no regional  wall motion abnormalities. Left ventricular diastolic parameters are  consistent with Grade I diastolic  dysfunction (impaired relaxation).   2. Right ventricular systolic function is normal. The right ventricular  size is mildly enlarged. There is normal pulmonary artery systolic  pressure.  The estimated right ventricular systolic pressure is 23.5 mmHg.   3. The mitral valve is abnormal. Trivial mitral valve regurgitation.   4. The aortic valve was not well visualized. Aortic valve regurgitation  is trivial. Aortic valve sclerosis/calcification is present, without any  evidence of aortic stenosis.   5. The inferior vena cava is normal in size with <50% respiratory  variability, suggesting right atrial pressure of 8 mmHg.    Assessment and Plan: Principal Problem:   Diarrhea due to drug INP/telemetry. Continue IV fluids. C. difficile toxin was negative. Continue ceftriaxone . Will add metronidazole. GI pathogen by PCR pending. May use as needed loperamide/Pepto-Bismol. Gastroenterology has been consulted.  Active Problems: History of:   Choledocholithiasis With abnormal LFTs. Mildly dilated CBD on ultrasound. GI recommended MRCP.    Transitional cell bladder cancer (HCC)  Had flexible cystoscopy with urology 5 days ago. She is scheduled to follow Monday.    Cancer of right lung parenchyma [facility]) Follow-up with oncology as scheduled.    Depression Continue mirtazapine  50 mg p.o. bedtime.    HTN (hypertension) : Antihypertensives for now. Monitor blood pressure.    GERD clinical dx only  Antiacid, H2 blocker or PPI as needed.    Hypomagnesemia Replacing.    Rheumatoid arthritis (HCC)   Sicca (HCC)   Sjogren syndrome, unspecified (HCC)  Continue Systane drops as needed. Follow-up with rheumatology as an outpatient.    Mild protein malnutrition (HCC) In the setting of anemia and malignancy. May benefit from protein supplementation. Consider nutritional services evaluation. Follow-up albumin level.    Heart failure with preserved left ventricular function (HFpEF) (HCC) No signs of volume overload. Patient seems volume depleted.   Advance Care Planning:   Code Status: Do not attempt resuscitation (DNR) PRE-ARREST INTERVENTIONS DESIRED   Consults:  Gastroenterology (Dr. Lorrene Rosser).  Family Communication:   Severity of Illness: The appropriate patient status for this patient is INPATIENT. Inpatient status is judged to be reasonable and necessary in order to provide the required intensity of service to ensure the patient's safety. The patient's presenting symptoms, physical exam findings, and initial radiographic and laboratory data in the context of their chronic comorbidities is felt to place them at high risk for further clinical deterioration. Furthermore, it is not anticipated that the patient will be medically stable for discharge from the hospital within 2 midnights of admission.   * I certify that at the point of admission it is my clinical judgment that the patient will require inpatient hospital care spanning beyond 2 midnights from the point of admission due to high intensity of service, high risk for further deterioration and high frequency of surveillance required.*  Author: Danice Dural, MD 06/06/2024 3:07 PM  For on call review [email or web address].   This document was prepared using Dragon voice recognition software and may contain some unintended transcription errors.

## 2024-06-06 NOTE — ED Provider Notes (Signed)
 [place] EMERGENCY DEPARTMENT AT [facility] Provider Note   CSN: [id number] Arrival date & time: 06/06/24  9285     History  No chief complaint on file.   Dana [name] is a 89 y.o. female with R lung cancer stage IIIB, TA high-grade transitional cell carcinoma concern for possible recurrence of the previous resection site status post recent transurethral section of bladder tumor, COPD, HTN, GERD, h/o C diff, sjogren syndrome, RA, h/o PE who was BIBEMS for a fall at home. She states she was ambulating to restroom when her legs gave out and she fell. No head trauma or LOC. She states her legs and whole body are weak, which she states happens when she gets dehydrated.  Per chart review presented to hospital on 06/02/2024 for a transurethral resection of bladder tumor.  They were unable to find the UO and so were planning on getting a renal ultrasound in the near future.  Planned to follow-up with urology on 6/20.  Patient complains of  nonbloody diarrhea almost constantly since the procedure with urology.  She denies any abdominal pain, nausea vomiting diarrhea, fever/chills.  She does endorse a mild nonproductive cough but no sore throat/nasal congestion.  Unsure if she was recently treated with antibiotics.  No recent travel. Denies any pain anywhere currently.    Past Medical History:  Diagnosis Date   Anemia    Arthritis    HANDS,  WRISTS   CAP (community acquired pneumonia) 12/25/2022   CHF (congestive heart failure) (HCC)    COPD GOLD 0/ lama/laba responsive 07/11/2019   Quit smoking 1979  07/05/2016  Walked RA x 3 laps @ 185 ft each stopped due to  End of study, nl pace no desat or sob   - Spirometry 07/05/2016  wnl p am spiriva    - CTa chest 07/05/2016 neg pe/ neg ILD, R infrahilar density c/w lung ca vs post RT scar    Chest CT w contrast  09/12/18  Stable CT of the chest  - 11/25/2018   Walked RA  2 laps @ 251ft each @ moderate pace  stopped due to  desats to 86% c    Depression 04/04/2017   Dyspnea    Dyspnea on exertion    Encounter for therapeutic drug monitoring 10/01/2016   GERD (gastroesophageal reflux disease)    at times   Heart failure with preserved left ventricular function (HFpEF) (HCC) 11/24/2023   Hemorrhoid    History of anal fissures    History of kidney stones    History of lung cancer ONCOLOGIST--  DR SHERROD--  LAST CT ,  NO RECURRENCE OR METS   DX JAN 2010 --  STAGE IIIA  NON-SMALL CELL ADENOCARCINOMA (RIGHT MIDDLE LOBE)---  S/P  CHEMORADIATIO THERAPY  (COMPLETE 03-21-2009)   History of pulmonary embolism    HTN (hypertension) 10/10/2017   Imbalance 06/04/2017   lung ca dx'd 2010   Normal cardiac stress test    2007  PER PT   Osteoporosis    PONV (postoperative nausea and vomiting)    Rash, skin    RIGHT FOREARM/ HAND   Right wrist pain    MASS   Wears glasses        Home Medications Prior to Admission medications   Medication Sig Start Date End Date Taking? Authorizing Provider  acetaminophen  (TYLENOL ) 650 MG CR tablet Take 650 mg by mouth in the morning.    [provider]  AMBULATORY NON FORMULARY MEDICATION R knee  brace Dispense 1 Dx code: M17.11 Use as needed 09/30/23   Corey, Evan S, MD  bismuth  subsalicylate (PEPTO BISMOL) 262 MG chewable tablet Chew 262 mg by mouth as needed for indigestion or diarrhea or loose stools.    [provider]  Calcium  Carbonate-Vit D-Min (CALCIUM  600+D PLUS MINERALS) 600-400 MG-UNIT TABS Take 1 tablet by mouth 2 (two) times daily.    [provider]  erlotinib  (TARCEVA ) 100 MG tablet Take 1 tablet (100 mg total) by mouth daily. Patient taking differently: Take 100 mg by mouth daily in the afternoon. 03/11/24   Sherrod Sherrod, MD  lactase (LACTAID) 3000 units tablet Take 3,000 Units by mouth daily as needed (when eating dairy products).    [provider]  loperamide  (IMODIUM  A-D) 2 MG tablet Take 2 mg by mouth as needed for diarrhea or loose  stools.    [provider]  loratadine  (CLARITIN ) 10 MG tablet Take 10 mg by mouth daily as needed for allergies.    [provider]  methocarbamol  (ROBAXIN ) 750 MG tablet Take 1 tablet (750 mg total) by mouth 4 (four) times daily for 20 doses. 06/02/24 06/07/24  Shane Steffan BROCKS, MD  mirtazapine  (REMERON ) 15 MG tablet TAKE 1 TABLET BY MOUTH AT BEDTIME 05/16/24   Heilingoetter, Cassandra L, PA-C  Multiple Vitamins-Minerals (PRESERVISION AREDS 2 PO) Take 1 tablet by mouth in the morning.    [provider]  Polyethyl Glycol-Propyl Glycol (SYSTANE OP) Place 1 drop into both eyes in the morning and at bedtime.    [provider]  polyethylene glycol (MIRALAX ) 17 g packet Take 17 g by mouth daily. 06/02/24   Shane Steffan BROCKS, MD  Probiotic Product (PROBIOTIC ACIDOPHILUS) CHEW Chew 1 tablet by mouth in the morning.    [provider]  sulfamethoxazole -trimethoprim  (BACTRIM  DS) 800-160 MG tablet Take 1 tablet by mouth 2 (two) times daily. 05/25/24   [provider]  Tiotropium Bromide -Olodaterol (STIOLTO RESPIMAT ) 2.5-2.5 MCG/ACT AERS INHALE 2 PUFFS BY MOUTH ONCE DAILY 07/10/22   Wert, Michael B, MD      Allergies    Methotrexate, Clindamycin  hcl, Lactose intolerance (gi), Amoxicillin, and Protonix  [pantoprazole  sodium]    Review of Systems   Review of Systems A 10 point review of systems was performed and is negative unless otherwise reported in HPI.  Physical Exam Updated Vital Signs Temp 99.1 F (37.3 C) (Axillary)   Ht 5' 2 (1.575 m)   Wt 45.4 kg   BMI 18.29 kg/m  Physical Exam General: Normal appearing elderly female, lying in bed.  HEENT: PERRLA, Sclera anicteric, MMM, trachea midline.  Cardiology: RRR, no murmurs/rubs/gallops.  Resp: Normal respiratory rate and effort. CTAB, no wheezes, rhonchi, crackles.  Abd: Soft, non-tender, non-distended. No rebound tenderness or guarding.  GU: Deferred. MSK: No peripheral edema or signs of  trauma. Extremities without deformity or TTP. No cyanosis or clubbing. Skin: warm, dry.  Back: No CVA tenderness Neuro: A&Ox4, CNs II-XII grossly intact. MAEs. Sensation grossly intact.  Psych: Normal mood and affect.   ED Results / Procedures / Treatments   Labs (all labs ordered are listed, but only abnormal results are displayed) Labs Reviewed  C DIFFICILE QUICK SCREEN W PCR REFLEX    GASTROINTESTINAL PANEL BY PCR, STOOL (REPLACES STOOL CULTURE)  RESP PANEL BY RT-PCR (RSV, FLU A&B, COVID)  [id number]  CBC WITH DIFFERENTIAL/PLATELET  COMPREHENSIVE METABOLIC PANEL WITH GFR  MAGNESIUM   URINALYSIS, ROUTINE W REFLEX MICROSCOPIC  LIPASE, BLOOD    EKG None  Radiology CXR: 1. No acute cardiopulmonary abnormality. 2. Similar appearance of the RIGHT lung status post treated RIGHT pulmonary malignancy.  RUQ US : Technically difficult exam. Limited visualization of the gallbladder. No gallstones identified.   Mild dilatation of common bile duct measuring 9 mm. Consider abdomen MRI and MRCP without and with contrast for further evaluation if clinically warranted.   Procedures Procedures    Medications Ordered in ED Medications  lactated ringers  bolus 1,000 mL (1,000 mLs Intravenous New Bag/Given 06/06/24 0840)  cefTRIAXone  (ROCEPHIN ) 1 g in sodium chloride  0.9 % 100 mL IVPB (1 g Intravenous New Bag/Given 06/06/24 1733)  magnesium  sulfate IVPB 2 g 50 mL (2 g Intravenous New Bag/Given 06/06/24 1946)  potassium chloride  SA (KLOR-CON  M) CR tablet 20 mEq (20 mEq Oral Given 06/06/24 1947)  gadobutrol  (GADAVIST ) 1 MMOL/ML injection 5 mL (5 mLs Intravenous Contrast Given 06/06/24 1857)    ED Course/ Medical Decision Making/ A&P                          Medical Decision Making Amount and/or Complexity of Data Reviewed Labs: ordered. Decision-making details documented in ED Course. Radiology: ordered. Decision-making details documented in ED Course.  Risk Decision regarding  hospitalization.    This patient presents to the ED for concern of fall at home, gen weakness, diarrhea; this involves an extensive number of treatment options, and is a complaint that carries with it a high risk of complications and morbidity.  I considered the following differential and admission for this acute, potentially life threatening condition.   MDM:    For ddx of acute diarrhea, consider:  -Bacterial: Given recent abx, considered C diff, reassuringly C diff negative. GI stool panel pending.   -Viral causes such as gastroenteritis d/t rotavirus or norovirus -Noninfectious causes include intraabdominal infections such as biliary disease given elevated LFTs. Will get RUQ US . Denies any abdominal pain that would indicate appendicitis or diverticulitis, SBO, or mesenteric ischemia -W/ nonproductive cough consider viral URI. CXR neg for PNA or any pulm edema/pleural effusion reassuringly. No wheezing to indicate COPD. No asymmetric leg edema to indicate PE, no SOB/CP.   Denies any pain, head trauma. No injuries noted from fall except an ecchymosis on R elbow with no c/f fx.   Clinical Course as of 06/08/24 2242  Sat Jun 06, 2024  0857 NEUT#(!): 9.1 Left shift [HN]  0857 DG Chest Portable 1 View 1. No acute cardiopulmonary abnormality. 2. Similar appearance of the RIGHT lung status post treated RIGHT pulmonary malignancy.   [HN]  1037 CMP, Mg, lipase all hemolyzed. Messaged to redraw. [HN]  1038 Patient with temperature 99.1 F axillary temp, pulse 101, respiratory rate 24.  Nearly has leukocytosis.  Will obtain blood cultures, lactic, and rectal temperature. [HN]  1310 Lipase(!): 298 +Pancreatitis [HN]  1310 AST(!): 112 [HN]  1311 ALT(!): 46 +elevated LFTs [HN]  1353 Urinalysis, Routine w reflex microscopic -Urine, Clean Catch(!) UTI present as well [HN]  1431 US  Abdomen Limited RUQ (LIVER/GB) Technically difficult exam. Limited visualization of the gallbladder. No  gallstones identified.  Mild dilatation of common bile duct measuring 9 mm. Consider abdomen MRI and MRCP without and with contrast for further evaluation if clinically warranted.   [HN]  1434 Consulted to GI and hospitalist [HN]  1434 Given low grade fever, tachycardia, and left shift will order ceftriaxone . Hasn't had any BMs since being in ED. [HN]  1510 C Difficile Quick Screen w PCR reflex [HN]  Clinical Course User Index [HN] Franklyn Sid SAILOR, MD    Labs: I Ordered, and personally interpreted labs.  The pertinent results include:  those listed above  Imaging Studies ordered: I ordered imaging studies including CXR I independently visualized and interpreted imaging. I agree with the radiologist interpretation  Additional history obtained from chart review.    Cardiac Monitoring: The patient was maintained on a cardiac monitor.  I personally viewed and interpreted the cardiac monitored which showed an underlying rhythm of: NSR  Reevaluation: After the interventions noted above, I reevaluated the patient and found that they have :improved  Social Determinants of Health: Lives in   Disposition:  Admit to medicine  Co morbidities that complicate the patient evaluation  Past Medical History:  Diagnosis Date   Anemia    Arthritis    HANDS,  WRISTS   CAP (community acquired pneumonia) 12/25/2022   CHF (congestive heart failure) (HCC)    COPD GOLD 0/ lama/laba responsive 07/11/2019   Quit smoking 1979  07/05/2016  Walked RA x 3 laps @ 185 ft each stopped due to  End of study, nl pace no desat or sob   - Spirometry 07/05/2016  wnl p am spiriva    - CTa chest 07/05/2016 neg pe/ neg ILD, R infrahilar density c/w lung ca vs post RT scar    Chest CT w contrast  09/12/18  Stable CT of the chest  - 11/25/2018   Walked RA  2 laps @ 215ft each @ moderate pace  stopped due to  desats to 86% c   Depression 04/04/2017   Dyspnea    Dyspnea on exertion    Encounter for therapeutic drug  monitoring 10/01/2016   GERD (gastroesophageal reflux disease)    at times   Heart failure with preserved left ventricular function (HFpEF) (HCC) 11/24/2023   Hemorrhoid    History of anal fissures    History of kidney stones    History of lung cancer ONCOLOGIST--  DR SHERROD--  LAST CT ,  NO RECURRENCE OR METS   DX JAN 2010 --  STAGE IIIA  NON-SMALL CELL ADENOCARCINOMA (RIGHT MIDDLE LOBE)---  S/P  CHEMORADIATIO THERAPY  (COMPLETE 03-21-2009)   History of pulmonary embolism    HTN (hypertension) 10/10/2017   Imbalance 06/04/2017   lung ca dx'd 2010   Normal cardiac stress test    2007  PER PT   Osteoporosis    PONV (postoperative nausea and vomiting)    Rash, skin    RIGHT FOREARM/ HAND   Right wrist pain    MASS   Wears glasses      Medicines Meds ordered this encounter  Medications   lactated ringers  bolus 1,000 mL   ondansetron  (ZOFRAN ) injection 4 mg    I have reviewed the patients home medicines and have made adjustments as needed  Problem List / ED Course: Problem List Items Addressed This Visit       Other   * (Principal) Overflow diarrhea - Primary   Other Visit Diagnoses       Fall, initial encounter                       This note was created using dictation software, which may contain spelling or grammatical errors.    Franklyn Sid SAILOR, MD 06/15/24 [phone]

## 2024-06-07 ENCOUNTER — Other Ambulatory Visit: Payer: Self-pay

## 2024-06-07 ENCOUNTER — Encounter (HOSPITAL_COMMUNITY): Payer: Self-pay | Admitting: Internal Medicine

## 2024-06-07 DIAGNOSIS — K521 Toxic gastroenteritis and colitis: Secondary | ICD-10-CM | POA: Diagnosis not present

## 2024-06-07 LAB — COMPREHENSIVE METABOLIC PANEL WITH GFR
ALT: 40 U/L (ref 0–44)
AST: 66 U/L — ABNORMAL HIGH (ref 15–41)
Albumin: 2.8 g/dL — ABNORMAL LOW (ref 3.5–5.0)
Alkaline Phosphatase: 67 U/L (ref 38–126)
Anion gap: 11 (ref 5–15)
BUN: 20 mg/dL (ref 8–23)
CO2: 24 mmol/L (ref 22–32)
Calcium: 9.4 mg/dL (ref 8.9–10.3)
Chloride: 102 mmol/L (ref 98–111)
Creatinine, Ser: 0.69 mg/dL (ref 0.44–1.00)
GFR, Estimated: >60 mL/min (ref >60)
Glucose, Bld: 82 mg/dL (ref 70–99)
Potassium: 3.6 mmol/L (ref 3.5–5.1)
Sodium: 137 mmol/L (ref 135–145)
Total Bilirubin: 1.2 mg/dL (ref 0.0–1.2)
Total Protein: 5.8 g/dL — ABNORMAL LOW (ref 6.5–8.1)

## 2024-06-07 LAB — GASTROINTESTINAL PANEL BY PCR, STOOL (REPLACES STOOL CULTURE)

## 2024-06-07 LAB — CBC
HCT: 38.8 % (ref 36.0–46.0)
Hemoglobin: 12.6 g/dL (ref 12.0–15.0)
MCH: 30 pg (ref 26.0–34.0)
MCHC: 32.5 g/dL (ref 30.0–36.0)
MCV: 92.4 fL (ref 80.0–100.0)
Platelets: 292 10*3/uL (ref 150–400)
RBC: 4.2 MIL/uL (ref 3.87–5.11)
RDW: 17.4 % — ABNORMAL HIGH (ref 11.5–15.5)
WBC: 18.8 10*3/uL — ABNORMAL HIGH (ref 4.0–10.5)
nRBC: 0 % (ref 0.0–0.2)

## 2024-06-07 NOTE — Plan of Care (Signed)
  Problem: Clinical Measurements: Goal: Diagnostic test results will improve Outcome: Progressing   Problem: Nutrition: Goal: Adequate nutrition will be maintained Outcome: Progressing   Problem: Elimination: Goal: Will not experience complications related to bowel motility Outcome: Progressing Goal: Will not experience complications related to urinary retention Outcome: Progressing   Problem: Pain Managment: Goal: General experience of comfort will improve and/or be controlled Outcome: Progressing   Problem: Safety: Goal: Ability to remain free from injury will improve Outcome: Progressing

## 2024-06-07 NOTE — Progress Notes (Addendum)
 PROGRESS NOTE  Dana [name] WUJ:[id number] DOB: 31-May-1935 DOA: 06/06/2024 PCP: [name], MD   LOS: 1 day   Brief narrative:  Dana [name] is a 89 y.o. female with past medical history significant of anemia, osteoarthritis, CAP, diastolic CHF, COPD, interstitial lung disease depression, GERD, nephrolithiasis, lung cancer, pulmonary embolism, hypertension, osteoporosis, Sjogren syndrome, history of C. difficile x 2 presented to hospital with near fall in the bathroom injuring her right forearm and right pretibial area.  Patient had multiple episodes of diarrhea after starting antibiotic following  resection of bladder tumor 4 days back.  In the ED vitals were stable.  Labs showed CBC of 10.5 hemoglobin 10.7.Urinalysis with moderate hemoglobin, trace leukocyte esterase and a few bacteria.  C. difficile antigen and toxin were negative.   Coronavirus, influenza and RSV PCR were negative.  Lactic acid was normal.  Lipase was 298 units/L.  Magnesium  1.3 mg/dL.  LFT with AST 812 and ALT 46 units/L. Portable chest radiograph with no acute cardiopulmonary abnormality.  Similar appearance of the right lung status post treated for right pulmonary malignancy.  Ultrasound of the abdomen Limited visualization of the gallbladder.  No gallstone identified.  Mild dilatation of the CBD measuring 9 mm.  MRCP suggested.  Patient was then admitted to the hospital for further evaluation and treatment.     Assessment/Plan: Principal Problem:   Diarrhea due to drug Active Problems:   Cancer of right lung parenchyma (HCC)   Depression   HTN (hypertension)   GERD clinical dx only    Choledocholithiasis   Hypomagnesemia   Rheumatoid arthritis (HCC)   Sicca (HCC)   Sjogren syndrome, unspecified (HCC)   Mild protein malnutrition (HCC)   Heart failure with preserved left ventricular function (HFpEF) (HCC)   Transitional cell bladder cancer (HCC)  Diarrhea. Deceived IV fluids.  C. difficile toxin was  negative.  GI pathogen panel was negative.  Continue Rocephin . As needed loperamide/Pepto-Bismol.  Still having diarrhea.  Encourage oral hydration.  Patient's daughter-in-law states that she initially had constipation and took some MiraLAX  after diarrhea history of C. difficile colitis.  Blood cultures negative in less than 12 hours.  Elevated LFTs with dilated CBD on ultrasound.  GI recommended MRCP.  MRCP showed mild intra and extrahepatic biliary dilatation CBD up to 3.7 cm without calculus.  This is unchanged compared to previous MRI.  Continue to monitor.  Looks like GI was reached out from the ED.   Transitional cell bladder cancer Had flexible cystoscopy with urology 5 days ago.  Plan for outpatient follow-up on Monday.  Biopsy from 5 days back was good.  Had BCG in the past.     Cancer of right lung parenchyma  Follow-up with oncology as outpatient.  Has been on Tarceva  for 15 years.     Depression Continue mirtazapine      HTN (hypertension) Antihypertensives on hold at this time.     GERD  Continue Pepto-Bismol     Hypomagnesemia Replenished. Check magnesium  level in am.      Rheumatoid arthritis with  Sicca    Sjogren syndrome, unspecified  Continue Systane drops.  Follow-up with rheumatology as outpatient.    Heart failure with preserved left ventricular function (HFpEF)  Currently compensated.  Debility deconditioning will get PT OT evaluation. DVT prophylaxis: enoxaparin  (LOVENOX ) injection 30 mg Start: 06/06/24 2200   Disposition: Likely home with home health in 1 to 2 days.  Will get PT OT evaluation  Status is: Inpatient Remains inpatient appropriate  because: IV hydration, ongoing diarrhea, pending clinical improvement,    Code Status:     Code Status: Do not attempt resuscitation (DNR) PRE-ARREST INTERVENTIONS DESIRED  Family Communication: I spoke with the patient's daughter in law on the phone and updated her about the clinical condition of the patient.    Consultants: None  Procedures: None  Anti-infectives:  Rocephin  and Flagyl IV  Anti-infectives (From admission, onward)    Start     Dose/Rate Route Frequency Ordered Stop   06/07/24 1700  cefTRIAXone  (ROCEPHIN ) 1 g in sodium chloride  0.9 % 100 mL IVPB        1 g 200 mL/hr over 30 Minutes Intravenous Every 24 hours 06/06/24 1603     06/06/24 1600  metroNIDAZOLE (FLAGYL) IVPB 500 mg        500 mg 100 mL/hr over 60 Minutes Intravenous Every 12 hours 06/06/24 1535     06/06/24 1445  cefTRIAXone  (ROCEPHIN ) 1 g in sodium chloride  0.9 % 100 mL IVPB        1 g 200 mL/hr over 30 Minutes Intravenous  Once 06/06/24 1433 06/06/24 1803       Subjective: Today, patient was seen and examined at bedside.  Patient still complains of some diarrhea.  Complains of generalized weakness.  Denies any shortness of breath dyspnea chest pain palpitation. The daughter in law states that she was constipated intially and used miralax  recently   Objective: Vitals:   06/07/24 0549 06/07/24 0902  BP: (!) 149/84   Pulse: (!) 107   Resp: 20   Temp: 99.5 F (37.5 C)   SpO2: 95% 95%    Intake/Output Summary (Last 24 hours) at 06/07/2024 0936 Last data filed at 06/07/2024 0306 Gross per 24 hour  Intake 435.1 ml  Output --  Net 435.1 ml   Filed Weights   06/06/24 0729  Weight: 45.4 kg   Body mass index is 18.29 kg/m.   Physical Exam:  GENERAL: Patient is alert awake and oriented. Not in obvious distress.  Thinly built, Communicative, elderly female. HENT: No scleral pallor or icterus. Pupils equally reactive to light. Oral mucosa is moist NECK: is supple, no gross swelling noted. CHEST: Clear to auscultation. No crackles or wheezes.  Diminished breath sounds bilaterally. CVS: S1 and S2 heard, no murmur. Regular rate and rhythm.  ABDOMEN: Soft, bowel sounds are present.  Nontender on palpation. EXTREMITIES: No edema. CNS: Cranial nerves are intact. No focal motor deficits.  Generalized  weakness noted. SKIN: warm and dry without rashes.  Data Review: I have personally reviewed the following laboratory data and studies,  CBC: Recent Labs  Lab 06/01/24 1122 06/06/24 0817 06/07/24 0639  WBC 7.2 10.5 18.8*  NEUTROABS  --  9.1*  --   HGB 11.5* 10.7* 12.6  HCT 35.5* 31.7* 38.8  MCV 92.0 89.0 92.4  PLT 300 286 292   Basic Metabolic Panel: Recent Labs  Lab 06/01/24 1122 06/06/24 1200 06/06/24 1806 06/07/24 0639  NA 138 136  --  137  K 4.0 3.7  --  3.6  CL 104 101  --  102  CO2 25 26  --  24  GLUCOSE 90 98  --  82  BUN 31* 29*  --  20  CREATININE 0.99 0.69  --  0.69  CALCIUM  9.7 9.6  --  9.4  MG  --  1.3*  --   --   PHOS  --   --  2.5  --    Liver  Function Tests: Recent Labs  Lab 06/06/24 1200 06/07/24 0639  AST 112* 66*  ALT 46* 40  ALKPHOS 56 67  BILITOT 1.3* 1.2  PROT 5.5* 5.8*  ALBUMIN 3.0* 2.8*   Recent Labs  Lab 06/06/24 1200  LIPASE 298*   No results for input(s): AMMONIA in the last 168 hours. Cardiac Enzymes: No results for input(s): CKTOTAL, CKMB, CKMBINDEX, TROPONINI in the last 168 hours. BNP (last 3 results) Recent Labs    11/24/23 1040  BNP 163.6*    ProBNP (last 3 results) No results for input(s): PROBNP in the last 8760 hours.  CBG: No results for input(s): GLUCAP in the last 168 hours. Recent Results (from the past 240 hours)  Resp panel by RT-PCR (RSV, Flu A&B, Covid) Anterior Nasal Swab     Status: None   Collection Time: 06/06/24  8:17 AM   Specimen: Anterior Nasal Swab  Result Value Ref Range Status   SARS Coronavirus 2 by RT PCR NEGATIVE NEGATIVE Final    Comment: (NOTE) SARS-CoV-2 target nucleic acids are NOT DETECTED.  The SARS-CoV-2 RNA is generally detectable in upper respiratory specimens during the acute phase of infection. The lowest concentration of SARS-CoV-2 viral copies this assay can detect is 138 copies/mL. A negative result does not preclude SARS-Cov-2 infection and should not  be used as the sole basis for treatment or other patient management decisions. A negative result may occur with  improper specimen collection/handling, submission of specimen other than nasopharyngeal swab, presence of viral mutation(s) within the areas targeted by this assay, and inadequate number of viral copies(<138 copies/mL). A negative result must be combined with clinical observations, patient history, and epidemiological information. The expected result is Negative.  Fact Sheet for Patients:  BloggerCourse.com  Fact Sheet for Healthcare Providers:  SeriousBroker.it  This test is no t yet approved or cleared by the [place]  FDA and  has been authorized for detection and/or diagnosis of SARS-CoV-2 by FDA under an Emergency Use Authorization (EUA). This EUA will remain  in effect (meaning this test can be used) for the duration of the COVID-19 declaration under Section 564(b)(1) of the Act, 21 U.S.C.section 360bbb-3(b)(1), unless the authorization is terminated  or revoked sooner.       Influenza A by PCR NEGATIVE NEGATIVE Final   Influenza B by PCR NEGATIVE NEGATIVE Final    Comment: (NOTE) The Xpert Xpress SARS-CoV-2/FLU/RSV plus assay is intended as an aid in the diagnosis of influenza from Nasopharyngeal swab specimens and should not be used as a sole basis for treatment. Nasal washings and aspirates are unacceptable for Xpert Xpress SARS-CoV-2/FLU/RSV testing.  Fact Sheet for Patients: BloggerCourse.com  Fact Sheet for Healthcare Providers: SeriousBroker.it  This test is not yet approved or cleared by the [place]  FDA and has been authorized for detection and/or diagnosis of SARS-CoV-2 by FDA under an Emergency Use Authorization (EUA). This EUA will remain in effect (meaning this test can be used) for the duration of the COVID-19 declaration under Section  564(b)(1) of the Act, 21 U.S.C. section 360bbb-3(b)(1), unless the authorization is terminated or revoked.     Resp Syncytial Virus by PCR NEGATIVE NEGATIVE Final    Comment: (NOTE) Fact Sheet for Patients: BloggerCourse.com  Fact Sheet for Healthcare Providers: SeriousBroker.it  This test is not yet approved or cleared by the [place]  FDA and has been authorized for detection and/or diagnosis of SARS-CoV-2 by FDA under an Emergency Use Authorization (EUA). This EUA will remain in effect (meaning this  test can be used) for the duration of the COVID-19 declaration under Section 564(b)(1) of the Act, 21 U.S.C. section 360bbb-3(b)(1), unless the authorization is terminated or revoked.  Performed at [facility], [address] [place], [place] [address]   Blood culture (routine x 2)     Status: None (Preliminary result)   Collection Time: 06/06/24  1:07 PM   Specimen: BLOOD LEFT HAND  Result Value Ref Range Status   Specimen Description   Final    BLOOD LEFT HAND Performed at [facility], [address] [place], [place] [address]    Special Requests   Final    BOTTLES DRAWN AEROBIC ONLY Blood Culture results may not be optimal due to an inadequate volume of blood received in culture bottles Performed at [facility], [address], [place], [place] [address]    Culture   Final    NO GROWTH < 24 HOURS Performed at [facility], [address], [place], [place] [address]    Report Status PENDING  Incomplete  C Difficile Quick Screen w PCR reflex     Status: None   Collection Time: 06/06/24  1:56 PM   Specimen: Stool  Result Value Ref Range Status   C Diff antigen NEGATIVE NEGATIVE Final   C Diff toxin NEGATIVE NEGATIVE Final   C Diff interpretation No C. difficile detected.  Final    Comment: Performed at [facility], [address],  [place], [place] [address]  Blood culture (routine x 2)     Status: None (Preliminary result)   Collection Time: 06/06/24  6:06 PM   Specimen: BLOOD  Result Value Ref Range Status   Specimen Description   Final    BLOOD SITE NOT SPECIFIED Performed at [facility], [address] [place], [place] [address]    Special Requests   Final    BOTTLES DRAWN AEROBIC ONLY Blood Culture results may not be optimal due to an inadequate volume of blood received in culture bottles Performed at [facility], [address], [place], [place] [address]    Culture   Final    NO GROWTH < 12 HOURS Performed at [facility], [address], [place], [place] [address]    Report Status PENDING  Incomplete     Studies: MR ABDOMEN MRCP W WO CONTAST Result Date: 06/06/2024 CLINICAL DATA:  Pancreatitis suspected EXAM: MRI ABDOMEN WITHOUT AND WITH CONTRAST (INCLUDING MRCP) TECHNIQUE: Multiplanar multisequence MR imaging of the abdomen was performed both before and after the administration of intravenous contrast. Heavily T2-weighted images of the biliary and pancreatic ducts were obtained, and three-dimensional MRCP images were rendered by post processing. CONTRAST:  5mL GADAVIST GADOBUTROL 1 MMOL/ML IV SOLN COMPARISON:  Right upper quadrant ultrasound, 06/06/2024, CT abdomen pelvis, 09/30/2023 FINDINGS: Lower chest: Cardiomegaly. Small right pleural effusion with post treatment appearance of the right lung base. Hepatobiliary: No solid liver abnormality is seen. Tiny gallstones (series 4, image 22). Mild intra and extrahepatic biliary ductal dilatation common bile duct measuring up to 0.7 cm within the pancreatic head and tapering to the ampulla without calculus or other obstruction visible (series 3, image 11). Pancreas: Unremarkable. No pancreatic ductal dilatation or surrounding inflammatory changes. Spleen: Normal in size without significant abnormality. Adrenals/Urinary Tract:  Adrenal glands are unremarkable. Kidneys are normal, without obvious renal calculi, solid lesion, or hydronephrosis. Stomach/Bowel: Stomach is within normal limits. No evidence of bowel wall  thickening, distention, or inflammatory changes. Vascular/Lymphatic: No significant vascular findings are present. No enlarged abdominal lymph nodes. Other: No abdominal wall hernia or abnormality. No ascites. Musculoskeletal: No acute or significant osseous findings. IMPRESSION: 1. Mild intra and extrahepatic biliary ductal dilatation, common bile duct measuring up to 0.7 cm within the pancreatic head and tapering to the ampulla without calculus or other obstruction visible. This is unchanged compared to examination dated 09/30/2023 and of uncertain significance, possibly within normal limits for advanced patient age. 2. Tiny gallstones. No gallbladder distention or gallbladder wall thickening. 3. No MR evidence of pancreatitis. 4. Cardiomegaly. 5. Small right pleural effusion with post treatment appearance of the right lung base, in keeping with known primary lung malignancy. Electronically Signed   By: Fredricka Jenny M.D.   On: 06/06/2024 21:15   US  Abdomen Limited RUQ (LIVER/GB) Result Date: 06/06/2024 CLINICAL DATA:  Elevated liver function tests. EXAM: ULTRASOUND ABDOMEN LIMITED RIGHT UPPER QUADRANT COMPARISON:  None Available. FINDINGS: Technically difficult exam due to habitus and patient's inability to cooperate with positioning. Gallbladder: Limited visualization. No gallstones or wall thickening visualized. No sonographic Murphy sign noted by sonographer. Common bile duct: Diameter: Mildly dilated measuring 9 mm in diameter. Liver: No focal lesion identified. Within normal limits in parenchymal echogenicity. Portal vein is patent on color Doppler imaging with normal direction of blood flow towards the liver. Other: None. IMPRESSION: Technically difficult exam. Limited visualization of the gallbladder. No gallstones  identified. Mild dilatation of common bile duct measuring 9 mm. Consider abdomen MRI and MRCP without and with contrast for further evaluation if clinically warranted. Electronically Signed   By: Marlyce Sine M.D.   On: 06/06/2024 14:17   DG Chest Portable 1 View Result Date: 06/06/2024 CLINICAL DATA:  cough, h/o lung cancer EXAM: PORTABLE CHEST 1 VIEW COMPARISON:  December 31, 2023 FINDINGS: The cardiomediastinal silhouette is unchanged in contour. Unchanged appearance of RIGHT-sided volume loss. Similar appearance of pleural blunting consistent with chronic small pleural effusion. Similar widening along the RIGHT hilar border consistent history of treated malignancy. No pneumothorax. No acute pleuroparenchymal abnormality. IMPRESSION: 1. No acute cardiopulmonary abnormality. 2. Similar appearance of the RIGHT lung status post treated RIGHT pulmonary malignancy. Electronically Signed   By: Clancy Crimes M.D.   On: 06/06/2024 08:35      Rosena Conradi, MD  [facility] 06/07/2024  If 7PM-7AM, please contact night-coverage

## 2024-06-07 NOTE — Plan of Care (Signed)

## 2024-06-07 NOTE — Consult Note (Signed)
 [facility] Gastroenterology Consult  Referring Provider: ER Primary Care Physician:  [name], MD Primary Gastroenterologist: Dr. Kimble Pennant  Reason for Consultation: Diarrhea  HPI: Dana [name] is a 89 y.o. female states that she was in her usual state of health until about 1 to 2 weeks ago when she complained of worsening weakness.  Today while trying to go to the restroom she fell and injured her right forearm and was brought to the ED with complaints of diarrhea.   Patient states for the last 2 weeks she has noticed about 2-4 loose bowel movements a day, not associated with blood in stool or black stool, without any abdominal pain, associated with mild rectal discomfort, denies nocturnal diarrhea. She has lost about 5 pounds in the last 2 weeks because of decreased appetite. She states she has intermittently been using antibiotics for urinary tract infection. She states she has had C. difficile infection twice in the past and this episode of diarrhea but does not feel like C. difficile infection. She denies nausea, vomiting, acid reflux, heartburn, difficulty swallowing or pain on swallowing.  Previous GI workup: Colonoscopy, 2016, surveillance: Diverticulosis in sigmoid and descending, otherwise unremarkable, repeat not recommended due to age Colonoscopy, 2011: Tubular adenoma removed from sigmoid, sigmoid diverticulosis, internal hemorrhoids, repeat recommended in 5 years  Past Medical History:  Diagnosis Date   Anemia    Arthritis    HANDS,  WRISTS   CAP (community acquired pneumonia) 12/25/2022   CHF (congestive heart failure) (HCC)    COPD GOLD 0/ lama/laba responsive 07/11/2019   Quit smoking 1979  07/05/2016  Walked RA x 3 laps @ 185 ft each stopped due to  End of study, nl pace no desat or sob   - Spirometry 07/05/2016  wnl p am spiriva    - CTa chest 07/05/2016 neg pe/ neg ILD, R infrahilar density c/w lung ca vs post RT scar    Chest CT w contrast  09/12/18  Stable CT of the  chest  - 11/25/2018   Walked RA  2 laps @ 277ft each @ moderate pace  stopped due to  desats to 86% c   Depression 04/04/2017   Dyspnea    Dyspnea on exertion    Encounter for therapeutic drug monitoring 10/01/2016   GERD (gastroesophageal reflux disease)    at times   Heart failure with preserved left ventricular function (HFpEF) (HCC) 11/24/2023   Hemorrhoid    History of anal fissures    History of kidney stones    History of lung cancer ONCOLOGIST--  DR Marguerita Shih--  LAST CT ,  NO RECURRENCE OR METS   DX JAN 2010 --  STAGE IIIA  NON-SMALL CELL ADENOCARCINOMA (RIGHT MIDDLE LOBE)---  S/P  CHEMORADIATIO THERAPY  (COMPLETE 03-21-2009)   History of pulmonary embolism    HTN (hypertension) 10/10/2017   Imbalance 06/04/2017   lung ca dx'd 2010   Normal cardiac stress test    2007  PER PT   Osteoporosis    PONV (postoperative nausea and vomiting)    Rash, skin    RIGHT FOREARM/ HAND   Right wrist pain    MASS   Wears glasses     Past Surgical History:  Procedure Laterality Date   ANAL FISSURE REPAIR  07/09/2011   INTERNAL SPHINCTEROTOMY   BENIGN EXCISION LEFT BREAST CENTRAL DUCT  02/1999   BREAST BIOPSY  01/31/2009   benign   BREAST EXCISIONAL BIOPSY Left 2004   benign   CHEST TUBE INSERTION Right  07/14/2014   Procedure: INSERTION PLEURAL DRAINAGE CATHETER RIGHT CHEST;  Surgeon: Norita Beauvais, MD;  Location: [facility] OR;  Service: Thoracic;  Laterality: Right;   CYSTOSCOPY W/ RETROGRADES N/A 06/02/2024   Procedure: CYSTOSCOPY;  Surgeon: Thelbert Finner, MD;  Location: [facility] ORS;  Service: Urology;  Laterality: N/A;   DIRECT LARYNGOSCOPY WITH RADIAESSE INJECTION Bilateral 08/05/2023   Procedure: SUSPENDED MIRCO DIRECT LARYNGOSCOPY WITH PROLARYN INJECTION;  Surgeon: Virgina Grills, MD;  Location: [facility] OR;  Service: ENT;  Laterality: Bilateral;   EXCISION RIGHT WRIST MASS  2012   EXTRACORPOREAL SHOCK WAVE LITHOTRIPSY Left 06/01/2019   Procedure: EXTRACORPOREAL SHOCK WAVE LITHOTRIPSY  (ESWL);  Surgeon: Marco Severs, MD;  Location: [facility] ORS;  Service: Urology;  Laterality: Left;   EYE SURGERY Bilateral    congenital spot removed and also cataract removed   IR ANGIOGRAM EXTREMITY RIGHT  07/09/2023   IR ANGIOGRAM SELECTIVE EACH ADDITIONAL VESSEL  07/09/2023   IR RADIOLOGIST EVAL & MGMT  05/28/2023   IR US  GUIDE VASC ACCESS RIGHT  07/09/2023   KNEE ARTHROSCOPY Right 1999   MASS EXCISION Right 03/11/2014   Procedure: RIGHT WRIST DEEP MASS EXCISION WITH CULTURE AND BIOSPY;  Surgeon: Shellie Dials, MD;  Location: [facility];  Service: Orthopedics;  Laterality: Right;   MICROLARYNGOSCOPY W/VOCAL CORD INJECTION Bilateral 02/08/2021   Procedure: MICROLARYNGOSCOPY WITH VOCAL CORD INJECTION;  Surgeon: Virgina Grills, MD;  Location: [facility] OR;  Service: ENT;  Laterality: Bilateral;   REMOVAL OF PLEURAL DRAINAGE CATHETER Right 10/14/2014   Procedure: REMOVAL OF PLEURAL DRAINAGE CATHETER;  Surgeon: Norita Beauvais, MD;  Location: [facility] OR;  Service: Thoracic;  Laterality: Right;   TALC  PLEURODESIS Right 09/16/2014   Procedure: TALC  PLEURADESIS/ slurry;  Surgeon: Norita Beauvais, MD;  Location: [facility] OR;  Service: Thoracic;  Laterality: Right;   THORACOSCOPY  01/26/2009   w/   lung/  node biopsy's   THUMB ARTHROSCOPY Left    - removed bone spur   TONSILLECTOMY  AS CHILD   TOTAL ABDOMINAL HYSTERECTOMY W/ BILATERAL SALPINGOOPHORECTOMY  1982   W/  APPENDECTOMY   TRANSTHORACIC ECHOCARDIOGRAM  12/23/2008   NORMAL LV/  EF 65-70%/  MILD MR  &  TR   TRANSURETHRAL RESECTION OF BLADDER TUMOR N/A 06/02/2024   Procedure: TURBT (TRANSURETHRAL RESECTION OF BLADDER TUMOR);  Surgeon: Thelbert Finner, MD;  Location: [facility] ORS;  Service: Urology;  Laterality: N/A;   VAULT SUSPENSION PLUS CYSTOCELE REPAIR WITH GRAFT  06/13/2010    Prior to Admission medications   Medication Sig Start Date End Date Taking? Authorizing Provider  acetaminophen  (TYLENOL ) 650 MG CR tablet Take 650 mg by  mouth in the morning.   Yes [provider]  BIOTIN PO Take 1 capsule by mouth daily.   Yes [provider]  bismuth subsalicylate (PEPTO BISMOL) 262 MG chewable tablet Chew 262 mg by mouth as needed for indigestion or diarrhea or loose stools.   Yes [provider]  Calcium  Carbonate-Vit D-Min (CALCIUM  600+D PLUS MINERALS) 600-400 MG-UNIT TABS Take 1 tablet by mouth 2 (two) times daily.   Yes [provider]  Cyanocobalamin  (VITAMIN B-12 PO) Take 1 tablet by mouth daily.   Yes [provider]  erlotinib  (TARCEVA ) 100 MG tablet Take 1 tablet (100 mg total) by mouth daily. Patient taking differently: Take 100 mg by mouth daily at 4 PM. 03/11/24  Yes Marlene Simas, MD  loperamide (IMODIUM A-D) 2 MG tablet Take 2 mg by mouth 4 (four)  times daily as needed for diarrhea or loose stools.   Yes [provider]  loratadine  (CLARITIN ) 10 MG tablet Take 10 mg by mouth daily as needed (for seasonal allergies).   Yes [provider]  mirtazapine  (REMERON ) 15 MG tablet TAKE 1 TABLET BY MOUTH AT BEDTIME 05/16/24  Yes Heilingoetter, Cassandra L, PA-C  Multiple Vitamins-Minerals (PRESERVISION AREDS 2 PO) Take 1 capsule by mouth in the morning and at bedtime.   Yes [provider]  Polyethyl Glycol-Propyl Glycol (SYSTANE OP) Place 1 drop into both eyes in the morning and at bedtime.   Yes [provider]  polyethylene glycol (MIRALAX ) 17 g packet Take 17 g by mouth daily. Patient taking differently: Take 17 g by mouth daily as needed for mild constipation (HOLD FOR DIARRHEA). 06/02/24  Yes Thelbert Finner, MD  Probiotic Product (PROBIOTIC ACIDOPHILUS) CHEW Chew 1 tablet by mouth in the morning.   Yes [provider]  Tiotropium Bromide -Olodaterol (STIOLTO RESPIMAT ) 2.5-2.5 MCG/ACT AERS INHALE 2 PUFFS BY MOUTH ONCE DAILY 07/10/22  Yes Wert, Michael B, MD  XYLITOL MT 1 tablet See admin instructions. Place 1 melt tablet in the  gumline at bedtime as directed   Yes [provider]  AMBULATORY NON FORMULARY MEDICATION R knee brace Dispense 1 Dx code: M17.11 Use as needed 09/30/23   Corey, Evan S, MD  methocarbamol  (ROBAXIN ) 750 MG tablet Take 1 tablet (750 mg total) by mouth 4 (four) times daily for 20 doses. Patient not taking: Reported on 06/06/2024 06/02/24 06/07/24  Thelbert Finner, MD    Current Facility-Administered Medications  Medication Dose Route Frequency Provider Last Rate Last Admin   acetaminophen  (TYLENOL ) tablet 650 mg  650 mg Oral Q6H PRN Danice Dural, MD       Or   acetaminophen  (TYLENOL ) suppository 650 mg  650 mg Rectal Q6H PRN Danice Dural, MD       arformoterol  (BROVANA ) nebulizer solution 15 mcg  15 mcg Nebulization BID Danice Dural, MD   15 mcg at 06/07/24 6440   And   umeclidinium bromide  (INCRUSE ELLIPTA ) 62.5 MCG/ACT 1 puff  1 puff Inhalation Daily Danice Dural, MD       bismuth subsalicylate (PEPTO BISMOL) chewable tablet 262 mg  262 mg Oral PRN Danice Dural, MD       calcium -vitamin D  (OSCAL WITH D) 500-5 MG-MCG per tablet 1 tablet  1 tablet Oral BID Danice Dural, MD   1 tablet at 06/07/24 0804   cefTRIAXone  (ROCEPHIN ) 1 g in sodium chloride  0.9 % 100 mL IVPB  1 g Intravenous Q24H Danice Dural, MD       enoxaparin  (LOVENOX ) injection 30 mg  30 mg Subcutaneous Q24H Danice Dural, MD   30 mg at 06/06/24 2058   loperamide (IMODIUM) capsule 2 mg  2 mg Oral QID PRN Danice Dural, MD   2 mg at 06/07/24 0103   loratadine  (CLARITIN ) tablet 10 mg  10 mg Oral Daily PRN Danice Dural, MD       metroNIDAZOLE (FLAGYL) IVPB 500 mg  500 mg Intravenous Q12H Danice Dural, MD 100 mL/hr at 06/07/24 0803 500 mg at 06/07/24 0803   mirtazapine  (REMERON ) tablet 15 mg  15 mg Oral QHS Danice Dural, MD   15 mg at 06/06/24 2055   ondansetron  (ZOFRAN ) tablet 4 mg  4 mg Oral Q6H PRN Danice Dural, MD       Or  ondansetron  (ZOFRAN ) injection 4 mg  4 mg Intravenous Q6H PRN Danice Dural, MD        Allergies as of 06/06/2024 - Review Complete 06/06/2024  Allergen Reaction Noted   Methotrexate Anaphylaxis and Shortness Of Breath 03/02/2021   Clindamycin  hcl Diarrhea 10/20/2020   Lactose intolerance (gi) Other (See Comments) 11/12/2023   Amoxicillin Diarrhea 11/12/2011   Protonix  [pantoprazole  sodium] Rash 07/10/2019    Family History  Problem Relation Age of Onset   Cancer Father        bladder   Cancer Son        kidney - removed as a teenager    Social History   Socioeconomic History   Marital status: Married    Spouse name: Not on file   Number of children: Not on file   Years of education: Not on file   Highest education level: Not on file  Occupational History   Not on file  Tobacco Use   Smoking status: Former    Current packs/day: 0.00    Average packs/day: 1 pack/day for 30.0 years (30.0 ttl pk-yrs)    Types: Cigarettes    Start date: 12/25/1947    Quit date: 12/24/1977    Years since quitting: 46.4    Passive exposure: Never   Smokeless tobacco: Never  Vaping Use   Vaping status: Never Used  Substance and Sexual Activity   Alcohol use: No   Drug use: No   Sexual activity: Not Currently  Other Topics Concern   Not on file  Social History Narrative   Not on file   Social Drivers of Health   Financial Resource Strain: Not on file  Food Insecurity: No Food Insecurity (06/06/2024)   Hunger Vital Sign    Worried About Running Out of Food in the Last Year: Never true    Ran Out of Food in the Last Year: Never true  Transportation Needs: No Transportation Needs (06/06/2024)   PRAPARE - Administrator, Civil Service (Medical): No    Lack of Transportation (Non-Medical): No  Physical Activity: Not on file  Stress: Not on file  Social Connections: Unknown (06/06/2024)   Social Connection and Isolation Panel    Frequency of Communication with Friends and  Family: More than three times a week    Frequency of Social Gatherings with Friends and Family: Once a week    Attends Religious Services: Never    Database administrator or Organizations: No    Attends Banker Meetings: Never    Marital Status: Not on file  Intimate Partner Violence: Not At Risk (06/06/2024)   Humiliation, Afraid, Rape, and Kick questionnaire    Fear of Current or Ex-Partner: No    Emotionally Abused: No    Physically Abused: No    Sexually Abused: No    Review of Systems: As per HPI Physical Exam: Vital signs in last 24 hours: Temp:  [98.6 F (37 C)-100.5 F (38.1 C)] 99.5 F (37.5 C) (06/15 0549) Pulse Rate:  [84-111] 107 (06/15 0549) Resp:  [16-20] 20 (06/15 0549) BP: (122-176)/(73-93) 149/84 (06/15 0549) SpO2:  [92 %-99 %] 95 % (06/15 0902) Last BM Date : 06/06/24  General: Cachectic, ill-appearing, multiple bruises noted  Head:  Normocephalic and atraumatic. Eyes:  Sclera clear, no icterus.   Conjunctiva pink. Ears:  Normal auditory acuity. Nose:  No deformity, discharge,  or lesions. Mouth:  No deformity or lesions.  Oropharynx pink & moist. Neck:  Supple; no masses or thyromegaly. Lungs:  Clear throughout to auscultation.   No wheezes, crackles, or rhonchi. No acute distress. Heart:  Regular rate and rhythm; no murmurs, clicks, rubs,  or gallops. Extremities:  Without clubbing or edema. Neurologic:  Alert and  oriented x4;  grossly normal neurologically. Skin: Chronic skin changes noted over right hand and multiple bruising noted in all 4 extremities specially right elbow Psych:  Alert and cooperative. Normal mood and affect. Abdomen:  Soft, nontender and nondistended. No masses, hepatosplenomegaly or hernias noted. Normal bowel sounds, without guarding, and without rebound.         Lab Results: Recent Labs    06/06/24 0817 06/07/24 0639  WBC 10.5 18.8*  HGB 10.7* 12.6  HCT 31.7* 38.8  PLT 286 292   BMET Recent Labs     06/06/24 1200 06/07/24 0639  NA 136 137  K 3.7 3.6  CL 101 102  CO2 26 24  GLUCOSE 98 82  BUN 29* 20  CREATININE 0.69 0.69  CALCIUM  9.6 9.4   LFT Recent Labs    06/07/24 0639  PROT 5.8*  ALBUMIN 2.8*  AST 66*  ALT 40  ALKPHOS 67  BILITOT 1.2   PT/INR No results for input(s): LABPROT, INR in the last 72 hours.  Studies/Results: MR ABDOMEN MRCP W WO CONTAST Result Date: 06/06/2024 CLINICAL DATA:  Pancreatitis suspected EXAM: MRI ABDOMEN WITHOUT AND WITH CONTRAST (INCLUDING MRCP) TECHNIQUE: Multiplanar multisequence MR imaging of the abdomen was performed both before and after the administration of intravenous contrast. Heavily T2-weighted images of the biliary and pancreatic ducts were obtained, and three-dimensional MRCP images were rendered by post processing. CONTRAST:  5mL GADAVIST GADOBUTROL 1 MMOL/ML IV SOLN COMPARISON:  Right upper quadrant ultrasound, 06/06/2024, CT abdomen pelvis, 09/30/2023 FINDINGS: Lower chest: Cardiomegaly. Small right pleural effusion with post treatment appearance of the right lung base. Hepatobiliary: No solid liver abnormality is seen. Tiny gallstones (series 4, image 22). Mild intra and extrahepatic biliary ductal dilatation common bile duct measuring up to 0.7 cm within the pancreatic head and tapering to the ampulla without calculus or other obstruction visible (series 3, image 11). Pancreas: Unremarkable. No pancreatic ductal dilatation or surrounding inflammatory changes. Spleen: Normal in size without significant abnormality. Adrenals/Urinary Tract: Adrenal glands are unremarkable. Kidneys are normal, without obvious renal calculi, solid lesion, or hydronephrosis. Stomach/Bowel: Stomach is within normal limits. No evidence of bowel wall thickening, distention, or inflammatory changes. Vascular/Lymphatic: No significant vascular findings are present. No enlarged abdominal lymph nodes. Other: No abdominal wall hernia or abnormality. No ascites.  Musculoskeletal: No acute or significant osseous findings. IMPRESSION: 1. Mild intra and extrahepatic biliary ductal dilatation, common bile duct measuring up to 0.7 cm within the pancreatic head and tapering to the ampulla without calculus or other obstruction visible. This is unchanged compared to examination dated 09/30/2023 and of uncertain significance, possibly within normal limits for advanced patient age. 2. Tiny gallstones. No gallbladder distention or gallbladder wall thickening. 3. No MR evidence of pancreatitis. 4. Cardiomegaly. 5. Small right pleural effusion with post treatment appearance of the right lung base, in keeping with known primary lung malignancy. Electronically Signed   By: Fredricka Jenny M.D.   On: 06/06/2024 21:15   US  Abdomen Limited RUQ (LIVER/GB) Result Date: 06/06/2024 CLINICAL DATA:  Elevated liver function tests. EXAM: ULTRASOUND ABDOMEN LIMITED RIGHT UPPER QUADRANT COMPARISON:  None Available. FINDINGS: Technically difficult exam due to habitus and patient's inability to cooperate with positioning. Gallbladder: Limited visualization. No gallstones  or wall thickening visualized. No sonographic Murphy sign noted by sonographer. Common bile duct: Diameter: Mildly dilated measuring 9 mm in diameter. Liver: No focal lesion identified. Within normal limits in parenchymal echogenicity. Portal vein is patent on color Doppler imaging with normal direction of blood flow towards the liver. Other: None. IMPRESSION: Technically difficult exam. Limited visualization of the gallbladder. No gallstones identified. Mild dilatation of common bile duct measuring 9 mm. Consider abdomen MRI and MRCP without and with contrast for further evaluation if clinically warranted. Electronically Signed   By: Marlyce Sine M.D.   On: 06/06/2024 14:17   DG Chest Portable 1 View Result Date: 06/06/2024 CLINICAL DATA:  cough, h/o lung cancer EXAM: PORTABLE CHEST 1 VIEW COMPARISON:  December 31, 2023 FINDINGS: The  cardiomediastinal silhouette is unchanged in contour. Unchanged appearance of RIGHT-sided volume loss. Similar appearance of pleural blunting consistent with chronic small pleural effusion. Similar widening along the RIGHT hilar border consistent history of treated malignancy. No pneumothorax. No acute pleuroparenchymal abnormality. IMPRESSION: 1. No acute cardiopulmonary abnormality. 2. Similar appearance of the RIGHT lung status post treated RIGHT pulmonary malignancy. Electronically Signed   By: Clancy Crimes M.D.   On: 06/06/2024 08:35    Impression: Diarrhea, appears to be resolving C. difficile antigen and toxin negative GI pathogen panel pending  Leukocytosis, 18.8  Mildly elevated LFT, T. bili 1.3/AST 112/ALT 46/ALP 56 with lipase of 298 Ultrasound showed CBD of 9 mm MRCP showed tiny gallstones, CBD of 7 mm without obstruction no stones, likely normal for age Normal pancreas  Multiple comorbidities:  Bladder cancer, status post TURBT on 06/02/2024 Sjogren's syndrome Osteoarthritis Diastolic CHF COPD and IDL History of pulmonary embolism and lung cancer  Plan: Tolerating regular diet, GI pathogen panel pending, started on IV ceftriaxone  and IV Flagyl empirically  Elevated lipase without evidence of pancreatitis or CBD stone, tiny gallstones noted, does not need ERCP  Continue supportive management, Dr. Kimble Pennant to follow in a.m..    LOS: 1 day   Genell Ken, MD  06/07/2024, 11:32 AM

## 2024-06-07 NOTE — Hospital Course (Addendum)
 Dana [name] is a 89 y.o. female with past medical history significant of anemia, osteoarthritis, CAP, diastolic CHF, COPD, interstitial lung disease depression, GERD, nephrolithiasis, lung cancer, pulmonary embolism, hypertension, osteoporosis, Sjogren syndrome, history of C. difficile x 2 presented to hospital with near fall in the bathroom injuring her right forearm and right pretibial area.  Patient had multiple episodes of diarrhea after starting antibiotic following  resection of bladder tumor 4 days back.  In the ED vitals were stable.  Labs showed CBC of 10.5 hemoglobin 10.7.Urinalysis with moderate hemoglobin, trace leukocyte esterase and a few bacteria.  C. difficile antigen and toxin were negative.   Coronavirus, influenza and RSV PCR were negative.  Lactic acid was normal.  Lipase was 298 units/L.  Magnesium  1.3 mg/dL.  LFT with AST 812 and ALT 46 units/L. Portable chest radiograph with no acute cardiopulmonary abnormality.  Similar appearance of the right lung status post treated for right pulmonary malignancy.  Ultrasound of the abdomen Limited visualization of the gallbladder.  No gallstone identified.  Mild dilatation of the CBD measuring 9 mm.  MRCP suggested.  Patient was then admitted to the hospital for further evaluation and treatment.  Assessment and Plan: Diarrhea. Continue IV fluids. C. difficile toxin was negative.  Continue Rocephin . As needed loperamide/Pepto-Bismol.  Elevated LFTs with dilated CBD on ultrasound.  GI recommended MRCP.  MRCP showed mild intra and extrahepatic biliary dilatation CBD up to 3.7 cm without calculus.  This is unchanged compared to previous MRI.  Continue to monitor.   Transitional cell bladder cancer Had flexible cystoscopy with urology 5 days ago.  Plan for outpatient follow-up on Monday.     Cancer of right lung parenchyma  Follow-up with oncology as outpatient.     Depression Continue mirtazapine      HTN (hypertension) Antihypertensives on  hold at this time.     GERD  Continue Pepto-Bismol     Hypomagnesemia Replenished.     Rheumatoid arthritis with  Sicca    Sjogren syndrome, unspecified  Continue Systane drops.  Follow-up with rheumatology as outpatient.    Heart failure with preserved left ventricular function (HFpEF)  Currently compensated.

## 2024-06-07 NOTE — Progress Notes (Signed)
   06/07/24 0953  TOC Brief Assessment  Insurance and Status Reviewed  Patient has primary care physician Yes  Home environment has been reviewed single family home  Prior level of function: independent  Prior/Current Home Services No current home services  Social Drivers of Health Review SDOH reviewed no interventions necessary  Readmission risk has been reviewed Yes  Transition of care needs transition of care needs identified, TOC will continue to follow    [place] Pr[place], MSW, LCSW 06/07/2024 9:53 AM

## 2024-06-08 ENCOUNTER — Inpatient Hospital Stay (HOSPITAL_COMMUNITY)

## 2024-06-08 DIAGNOSIS — K521 Toxic gastroenteritis and colitis: Secondary | ICD-10-CM | POA: Diagnosis not present

## 2024-06-08 LAB — BASIC METABOLIC PANEL WITH GFR
Anion gap: 8 (ref 5–15)
BUN: 27 mg/dL — ABNORMAL HIGH (ref 8–23)
CO2: 22 mmol/L (ref 22–32)
Calcium: 8.9 mg/dL (ref 8.9–10.3)
Chloride: 106 mmol/L (ref 98–111)
Creatinine, Ser: 0.9 mg/dL (ref 0.44–1.00)
GFR, Estimated: >60 mL/min (ref >60)
Glucose, Bld: 94 mg/dL (ref 70–99)
Potassium: 4.4 mmol/L (ref 3.5–5.1)
Sodium: 136 mmol/L (ref 135–145)

## 2024-06-08 LAB — CBC
HCT: 35.5 % — ABNORMAL LOW (ref 36.0–46.0)
Hemoglobin: 11.5 g/dL — ABNORMAL LOW (ref 12.0–15.0)
MCH: 30 pg (ref 26.0–34.0)
MCHC: 32.4 g/dL (ref 30.0–36.0)
MCV: 92.7 fL (ref 80.0–100.0)
Platelets: 257 10*3/uL (ref 150–400)
RBC: 3.83 MIL/uL — ABNORMAL LOW (ref 3.87–5.11)
RDW: 17.6 % — ABNORMAL HIGH (ref 11.5–15.5)
WBC: 14.1 10*3/uL — ABNORMAL HIGH (ref 4.0–10.5)
nRBC: 0 % (ref 0.0–0.2)

## 2024-06-08 LAB — MAGNESIUM: Magnesium: 1.5 mg/dL — ABNORMAL LOW (ref 1.7–2.4)

## 2024-06-08 MED ORDER — SODIUM CHLORIDE 0.9% FLUSH: 10.0000 mL | INTRAVENOUS | Status: DC | PRN

## 2024-06-08 MED ORDER — MAGNESIUM SULFATE 2 GM/50ML IV SOLN
2.0000 g | Freq: Once | INTRAVENOUS | Status: AC
  Administered 2024-06-08: 2 g via INTRAVENOUS
  Filled 2024-06-08: qty 50

## 2024-06-08 NOTE — Progress Notes (Signed)
 OT Cancellation Note  Patient Details Name: Dana [name] MRN: [id number] DOB: 1935/07/25   Cancelled Treatment:    Reason Eval/Treat Not Completed: Other (comment) Patient is eating lunch in bed declining to get out of bed at this time. Educated on proper postioning for self feeding at bed level to reduce aspiration risk. Patient agreeable to repositioning in bed. OT to continue to follow and check back as schedule will allow.   Wynette Heckler, MS Acute Rehabilitation Department Office# [phone]  Jame Maze 06/08/2024, 2:07 PM

## 2024-06-08 NOTE — Plan of Care (Signed)

## 2024-06-08 NOTE — Progress Notes (Signed)
 RN request to attempt IV access. Pt refused primary RN to assess for PIV and request IV team consult.

## 2024-06-08 NOTE — Progress Notes (Signed)
 On afternoon assessment, IV pump al;arming. RN assessed LAC at infusion site and IV is infiltrated. Erythema and edema to LUE and painful with and without palpation.

## 2024-06-08 NOTE — Progress Notes (Signed)

## 2024-06-08 NOTE — Progress Notes (Addendum)
 PROGRESS NOTE  [name]:[id number] DOB: Jan 11, 1935 DOA: 06/06/2024 PCP: [name], MD   LOS: 2 days   Brief narrative:  Dana [name] is a 89 y.o. female with past medical history significant of anemia, osteoarthritis, CAP, diastolic CHF, COPD, interstitial lung disease depression, GERD, nephrolithiasis, lung cancer, pulmonary embolism, hypertension, osteoporosis, Sjogren syndrome, history of C. difficile x 2 presented to hospital with near fall in the bathroom injuring her right forearm and right pretibial area.  Patient had multiple episodes of diarrhea after starting antibiotic following  resection of bladder tumor 4 days back.  In the ED vitals were stable.  Labs showed CBC of 10.5 hemoglobin 10.7.Urinalysis with moderate hemoglobin, trace leukocyte esterase and a few bacteria.  C. difficile antigen and toxin were negative.   Coronavirus, influenza and RSV PCR were negative.  Lactic acid was normal.  Lipase was 298 units/L.  Magnesium  1.3 mg/dL.  LFT with AST 812 and ALT 46 units/L. Portable chest radiograph with no acute cardiopulmonary abnormality.  Similar appearance of the right lung status post treated for right pulmonary malignancy.  Ultrasound of the abdomen Limited visualization of the gallbladder.  No gallstone identified.  Mild dilatation of the CBD measuring 9 mm.  MRCP suggested.  Patient was then admitted to the hospital for further evaluation and treatment.     Assessment/Plan: Principal Problem:   Diarrhea due to drug Active Problems:   Cancer of right lung parenchyma (HCC)   Depression   HTN (hypertension)   GERD clinical dx only    Choledocholithiasis   Hypomagnesemia   Rheumatoid arthritis (HCC)   Sicca (HCC)   Sjogren syndrome, unspecified (HCC)   Mild protein malnutrition (HCC)   Heart failure with preserved left ventricular function (HFpEF) (HCC)   Transitional cell bladder cancer (HCC)  Diarrhea likely leakage from rectal impaction site. During  hospitalization patient received IV fluids.  C. difficile toxin was negative.  GI pathogen panel was negative.  On Rocephin  and Flagyl.  GI following.  Abdominal x-ray with possible rectal impaction.  Likely overflow diarrhea.  Patient's daughter-in-law states that she initially had constipation and took some MiraLAX  after diarrhea history of C. difficile colitis.  Blood cultures negative in 2 days.  Elevated LFTs with dilated CBD on ultrasound.  GI recommended MRCP.  MRCP showed mild intra and extrahepatic biliary dilatation CBD up to 3.7 cm without calculus.  This is unchanged compared to previous MRI.  GI following.  LFTs are improving.   Transitional cell bladder cancer Had flexible cystoscopy with urology 5 days ago.  Plan for outpatient follow-up with urology as outpatient.  Biopsy from 5 days back was good as per the patient's family.  Had BCG in the past.     Cancer of right lung parenchyma  Follow-up with oncology as outpatient.  Has been on Tarceva  for 15 years.  Controlled as per the family.     Depression Continue mirtazapine      HTN (hypertension) Antihypertensives on hold at this time.  Blood pressure is borderline low.  Will continue to monitor.     GERD       Hypomagnesemia Will continue to replenish with 2 g of IV magnesium  sulfate today.  Check levels in AM.     Rheumatoid arthritis with  Sicca    Sjogren syndrome, unspecified  Continue Systane drops.  Follow-up with rheumatology as outpatient.    Heart failure with preserved left ventricular function (HFpEF)  Currently compensated.  Debility deconditioning will get PT  OT evaluation.  DVT prophylaxis: enoxaparin  (LOVENOX ) injection 30 mg Start: 06/06/24 2200   Disposition: Likely home with home health likely on 06/09/2024, will get PT OT evaluation, pending evaluation.  Status is: Inpatient Remains inpatient appropriate because: GI symptom, pending clinical improvement, valuation    Code Status:     Code  Status: Do not attempt resuscitation (DNR) PRE-ARREST INTERVENTIONS DESIRED  Family Communication: I spoke with the patient's daughter in law on the phone on 06/08/2024 Consultants: None  Procedures: None  Anti-infectives:  Rocephin  and Flagyl IV  Anti-infectives (From admission, onward)    Start     Dose/Rate Route Frequency Ordered Stop   06/07/24 1700  cefTRIAXone  (ROCEPHIN ) 1 g in sodium chloride  0.9 % 100 mL IVPB        1 g 200 mL/hr over 30 Minutes Intravenous Every 24 hours 06/06/24 1603     06/06/24 1600  metroNIDAZOLE (FLAGYL) IVPB 500 mg        500 mg 100 mL/hr over 60 Minutes Intravenous Every 12 hours 06/06/24 1535     06/06/24 1445  cefTRIAXone  (ROCEPHIN ) 1 g in sodium chloride  0.9 % 100 mL IVPB        1 g 200 mL/hr over 30 Minutes Intravenous  Once 06/06/24 1433 06/06/24 1803       Subjective:  Today, patient was seen and examined at bedside.  Patient feels slightly bloated with some loose stools.  Complains of generalized weakness.  Objective: Vitals:   06/08/24 0521 06/08/24 0947  BP: 108/69   Pulse: (!) 101   Resp: 16   Temp: (!) 97.4 F (36.3 C)   SpO2: 95% 95%    Intake/Output Summary (Last 24 hours) at 06/08/2024 1406 Last data filed at 06/08/2024 1019 Gross per 24 hour  Intake 780 ml  Output 50 ml  Net 730 ml   Filed Weights   06/06/24 0729  Weight: 45.4 kg   Body mass index is 18.29 kg/m.   Physical Exam:  GENERAL: Patient is alert awake and oriented. Not in obvious distress.  Thinly built, Communicative, elderly female.  Deconditioned. HENT: No scleral pallor or icterus. Pupils equally reactive to light. Oral mucosa is slightly dry. NECK: is supple, no gross swelling noted. CHEST: Clear to auscultation. No crackles or wheezes.  Diminished breath sounds bilaterally. CVS: S1 and S2 heard, no murmur. Regular rate and rhythm.  ABDOMEN: Soft, bowel sounds are present.  Slightly distended abdomen.  Nontender on palpation. EXTREMITIES: No  edema. CNS: Cranial nerves are intact.   Generalized weakness noted. SKIN: warm and dry, ecchymosis  Data Review: I have personally reviewed the following laboratory data and studies,  CBC: Recent Labs  Lab 06/06/24 0817 06/07/24 0639 06/08/24 0521  WBC 10.5 18.8* 14.1*  NEUTROABS 9.1*  --   --   HGB 10.7* 12.6 11.5*  HCT 31.7* 38.8 35.5*  MCV 89.0 92.4 92.7  PLT 286 292 257   Basic Metabolic Panel: Recent Labs  Lab 06/06/24 1200 06/06/24 1806 06/07/24 0639 06/08/24 0521  NA 136  --  137 136  K 3.7  --  3.6 4.4  CL 101  --  102 106  CO2 26  --  24 22  GLUCOSE 98  --  82 94  BUN 29*  --  20 27*  CREATININE 0.69  --  0.69 0.90  CALCIUM  9.6  --  9.4 8.9  MG 1.3*  --   --  1.5*  PHOS  --  2.5  --   --  Liver Function Tests: Recent Labs  Lab 06/06/24 1200 06/07/24 0639  AST 112* 66*  ALT 46* 40  ALKPHOS 56 67  BILITOT 1.3* 1.2  PROT 5.5* 5.8*  ALBUMIN 3.0* 2.8*   Recent Labs  Lab 06/06/24 1200  LIPASE 298*   No results for input(s): AMMONIA in the last 168 hours. Cardiac Enzymes: No results for input(s): CKTOTAL, CKMB, CKMBINDEX, TROPONINI in the last 168 hours. BNP (last 3 results) Recent Labs    11/24/23 1040  BNP 163.6*    ProBNP (last 3 results) No results for input(s): PROBNP in the last 8760 hours.  CBG: No results for input(s): GLUCAP in the last 168 hours. Recent Results (from the past 240 hours)  Resp panel by RT-PCR (RSV, Flu A&B, Covid) Anterior Nasal Swab     Status: None   Collection Time: 06/06/24  8:17 AM   Specimen: Anterior Nasal Swab  Result Value Ref Range Status   SARS Coronavirus 2 by RT PCR NEGATIVE NEGATIVE Final    Comment: (NOTE) SARS-CoV-2 target nucleic acids are NOT DETECTED.  The SARS-CoV-2 RNA is generally detectable in upper respiratory specimens during the acute phase of infection. The lowest concentration of SARS-CoV-2 viral copies this assay can detect is 138 copies/mL. A negative result does  not preclude SARS-Cov-2 infection and should not be used as the sole basis for treatment or other patient management decisions. A negative result may occur with  improper specimen collection/handling, submission of specimen other than nasopharyngeal swab, presence of viral mutation(s) within the areas targeted by this assay, and inadequate number of viral copies(<138 copies/mL). A negative result must be combined with clinical observations, patient history, and epidemiological information. The expected result is Negative.  Fact Sheet for Patients:  BloggerCourse.com  Fact Sheet for Healthcare Providers:  SeriousBroker.it  This test is no t yet approved or cleared by the [place]  FDA and  has been authorized for detection and/or diagnosis of SARS-CoV-2 by FDA under an Emergency Use Authorization (EUA). This EUA will remain  in effect (meaning this test can be used) for the duration of the COVID-19 declaration under Section 564(b)(1) of the Act, 21 U.S.C.section 360bbb-3(b)(1), unless the authorization is terminated  or revoked sooner.       Influenza A by PCR NEGATIVE NEGATIVE Final   Influenza B by PCR NEGATIVE NEGATIVE Final    Comment: (NOTE) The Xpert Xpress SARS-CoV-2/FLU/RSV plus assay is intended as an aid in the diagnosis of influenza from Nasopharyngeal swab specimens and should not be used as a sole basis for treatment. Nasal washings and aspirates are unacceptable for Xpert Xpress SARS-CoV-2/FLU/RSV testing.  Fact Sheet for Patients: BloggerCourse.com  Fact Sheet for Healthcare Providers: SeriousBroker.it  This test is not yet approved or cleared by the [place]  FDA and has been authorized for detection and/or diagnosis of SARS-CoV-2 by FDA under an Emergency Use Authorization (EUA). This EUA will remain in effect (meaning this test can be used) for the  duration of the COVID-19 declaration under Section 564(b)(1) of the Act, 21 U.S.C. section 360bbb-3(b)(1), unless the authorization is terminated or revoked.     Resp Syncytial Virus by PCR NEGATIVE NEGATIVE Final    Comment: (NOTE) Fact Sheet for Patients: BloggerCourse.com  Fact Sheet for Healthcare Providers: SeriousBroker.it  This test is not yet approved or cleared by the [place]  FDA and has been authorized for detection and/or diagnosis of SARS-CoV-2 by FDA under an Emergency Use Authorization (EUA). This EUA will remain in effect (meaning  this test can be used) for the duration of the COVID-19 declaration under Section 564(b)(1) of the Act, 21 U.S.C. section 360bbb-3(b)(1), unless the authorization is terminated or revoked.  Performed at [facility], [address], [place], [place] [address]   Blood culture (routine x 2)     Status: None (Preliminary result)   Collection Time: 06/06/24  1:07 PM   Specimen: BLOOD LEFT HAND  Result Value Ref Range Status   Specimen Description   Final    BLOOD LEFT HAND Performed at [facility], [address], [place], [place] [address]    Special Requests   Final    BOTTLES DRAWN AEROBIC ONLY Blood Culture results may not be optimal due to an inadequate volume of blood received in culture bottles Performed at [facility], [address], [place], [place] [address]    Culture   Final    NO GROWTH 2 DAYS Performed at [facility], [address] [place], [place] [address]    Report Status PENDING  Incomplete  C Difficile Quick Screen w PCR reflex     Status: None   Collection Time: 06/06/24  1:56 PM   Specimen: Stool  Result Value Ref Range Status   C Diff antigen NEGATIVE NEGATIVE Final   C Diff toxin NEGATIVE NEGATIVE Final   C Diff interpretation No C. difficile detected.  Final    Comment: Performed at [facility], [address], [place], [place] [address]  Gastrointestinal Panel by PCR , Stool     Status: None   Collection Time: 06/06/24  1:57 PM   Specimen: Stool  Result Value Ref Range Status   Campylobacter species NOT DETECTED NOT DETECTED Final   Plesimonas shigelloides NOT DETECTED NOT DETECTED Final   Salmonella species NOT DETECTED NOT DETECTED Final   Yersinia enterocolitica NOT DETECTED NOT DETECTED Final   Vibrio species NOT DETECTED NOT DETECTED Final   Vibrio cholerae NOT DETECTED NOT DETECTED Final   Enteroaggregative E coli (EAEC) NOT DETECTED NOT DETECTED Final   Enteropathogenic E coli (EPEC) NOT DETECTED NOT DETECTED Final   Enterotoxigenic E coli (ETEC) NOT DETECTED NOT DETECTED Final   Shiga like toxin producing E coli (STEC) NOT DETECTED NOT DETECTED Final   Shigella/Enteroinvasive E coli (EIEC) NOT DETECTED NOT DETECTED Final   Cryptosporidium NOT DETECTED NOT DETECTED Final   Cyclospora cayetanensis NOT DETECTED NOT DETECTED Final   Entamoeba histolytica NOT DETECTED NOT DETECTED Final   Giardia lamblia NOT DETECTED NOT DETECTED Final   Adenovirus F40/41 NOT DETECTED NOT DETECTED Final   Astrovirus NOT DETECTED NOT DETECTED Final   Norovirus GI/GII NOT DETECTED NOT DETECTED Final   Rotavirus A NOT DETECTED NOT DETECTED Final   Sapovirus (I, II, IV, and V) NOT DETECTED NOT DETECTED Final    Comment: Performed at [facility], [address] [place], [place] [address]  Blood culture (routine x 2)     Status: None (Preliminary result)   Collection Time: 06/06/24  6:06 PM   Specimen: BLOOD  Result Value Ref Range Status   Specimen Description   Final    BLOOD SITE NOT SPECIFIED Performed at [facility], [address], [place], [place] [address]    Special Requests   Final    BOTTLES DRAWN AEROBIC ONLY Blood Culture results may not be optimal due to an inadequate volume of blood received in culture bottles Performed at  [facility], [address], [place], [place] [address]    Culture   Final    NO GROWTH 2 DAYS Performed at [facility], [address], [place], [place] [address]    Report Status PENDING  Incomplete     Studies: DG Abd 2 Views Result Date: 06/08/2024 CLINICAL DATA:  Abdominal pain and distension 2 weeks.  Diarrhea. EXAM: ABDOMEN - 2 VIEW COMPARISON:  06/17/2019 FINDINGS: Bowel gas pattern is nonobstructive. Mild fecal retention over the rectum. No free peritoneal air. Stable right base opacification likely effusion/atelectasis. Moderate degenerative changes of the curvature of the thoracolumbar spine convex right unchanged. Mild symmetric osteoarthritic change of the hips. Diffuse decreased bone mineralization. IMPRESSION: 1. No acute findings.  Mild fecal retention over the rectum. 2. Stable right base opacification likely effusion/atelectasis. Electronically Signed   By: Roda Cirri M.D.   On: 06/08/2024 13:29   MR 3D Recon At Scanner Result Date: 06/08/2024 CLINICAL DATA:  Pancreatitis suspected EXAM: MRI ABDOMEN WITHOUT AND WITH CONTRAST (INCLUDING MRCP) TECHNIQUE: Multiplanar multisequence MR imaging of the abdomen was performed both before and after the administration of intravenous contrast. Heavily T2-weighted images of the biliary and pancreatic ducts were obtained, and three-dimensional MRCP images were rendered by post processing. CONTRAST:  5mL GADAVIST GADOBUTROL 1 MMOL/ML IV SOLN COMPARISON:  Right upper quadrant ultrasound, 06/06/2024, CT abdomen pelvis, 09/30/2023 FINDINGS: Lower chest: Cardiomegaly. Small right pleural effusion with post treatment appearance of the right lung base. Hepatobiliary: No solid liver abnormality is seen. Tiny gallstones (series 4, image 22). Mild intra and extrahepatic biliary ductal dilatation common bile duct measuring up to 0.7 cm within the pancreatic head and tapering to the ampulla without calculus or other obstruction  visible (series 3, image 11). Pancreas: Unremarkable. No pancreatic ductal dilatation or surrounding inflammatory changes. Spleen: Normal in size without significant abnormality. Adrenals/Urinary Tract: Adrenal glands are unremarkable. Kidneys are normal, without obvious renal calculi, solid lesion, or hydronephrosis. Stomach/Bowel: Stomach is within normal limits. No evidence of bowel wall thickening, distention, or inflammatory changes. Vascular/Lymphatic: No significant vascular findings are present. No enlarged abdominal lymph nodes. Other: No abdominal wall hernia or abnormality. No ascites. Musculoskeletal: No acute or significant osseous findings. IMPRESSION: 1. Mild intra and extrahepatic biliary ductal dilatation, common bile duct measuring up to 0.7 cm within the pancreatic head and tapering to the ampulla without calculus or other obstruction visible. This is unchanged compared to examination dated 09/30/2023 and of uncertain significance, possibly within normal limits for advanced patient age. 2. Tiny gallstones. No gallbladder distention or gallbladder wall thickening. 3. No MR evidence of pancreatitis. 4. Cardiomegaly. 5. Small right pleural effusion with post treatment appearance of the right lung base, in keeping with known primary lung malignancy. Electronically Signed   By: Fredricka Jenny M.D.   On: 06/08/2024 11:09   MR ABDOMEN MRCP W WO CONTAST Result Date: 06/06/2024 CLINICAL DATA:  Pancreatitis suspected EXAM: MRI ABDOMEN WITHOUT AND WITH CONTRAST (INCLUDING MRCP) TECHNIQUE: Multiplanar multisequence MR imaging of the abdomen was performed both before and after the administration of intravenous contrast. Heavily T2-weighted images of the biliary and pancreatic ducts were obtained, and three-dimensional MRCP images were rendered by post processing. CONTRAST:  5mL GADAVIST GADOBUTROL 1 MMOL/ML IV SOLN COMPARISON:  Right upper quadrant ultrasound, 06/06/2024, CT abdomen pelvis, 09/30/2023  FINDINGS: Lower chest: Cardiomegaly. Small right pleural effusion with post treatment appearance of the right lung base. Hepatobiliary: No solid liver abnormality is seen. Tiny gallstones (series 4, image  22). Mild intra and extrahepatic biliary ductal dilatation common bile duct measuring up to 0.7 cm within the pancreatic head and tapering to the ampulla without calculus or other obstruction visible (series 3, image 11). Pancreas: Unremarkable. No pancreatic ductal dilatation or surrounding inflammatory changes. Spleen: Normal in size without significant abnormality. Adrenals/Urinary Tract: Adrenal glands are unremarkable. Kidneys are normal, without obvious renal calculi, solid lesion, or hydronephrosis. Stomach/Bowel: Stomach is within normal limits. No evidence of bowel wall thickening, distention, or inflammatory changes. Vascular/Lymphatic: No significant vascular findings are present. No enlarged abdominal lymph nodes. Other: No abdominal wall hernia or abnormality. No ascites. Musculoskeletal: No acute or significant osseous findings. IMPRESSION: 1. Mild intra and extrahepatic biliary ductal dilatation, common bile duct measuring up to 0.7 cm within the pancreatic head and tapering to the ampulla without calculus or other obstruction visible. This is unchanged compared to examination dated 09/30/2023 and of uncertain significance, possibly within normal limits for advanced patient age. 2. Tiny gallstones. No gallbladder distention or gallbladder wall thickening. 3. No MR evidence of pancreatitis. 4. Cardiomegaly. 5. Small right pleural effusion with post treatment appearance of the right lung base, in keeping with known primary lung malignancy. Electronically Signed   By: Fredricka Jenny M.D.   On: 06/06/2024 21:15   US  Abdomen Limited RUQ (LIVER/GB) Result Date: 06/06/2024 CLINICAL DATA:  Elevated liver function tests. EXAM: ULTRASOUND ABDOMEN LIMITED RIGHT UPPER QUADRANT COMPARISON:  None Available.  FINDINGS: Technically difficult exam due to habitus and patient's inability to cooperate with positioning. Gallbladder: Limited visualization. No gallstones or wall thickening visualized. No sonographic Murphy sign noted by sonographer. Common bile duct: Diameter: Mildly dilated measuring 9 mm in diameter. Liver: No focal lesion identified. Within normal limits in parenchymal echogenicity. Portal vein is patent on color Doppler imaging with normal direction of blood flow towards the liver. Other: None. IMPRESSION: Technically difficult exam. Limited visualization of the gallbladder. No gallstones identified. Mild dilatation of common bile duct measuring 9 mm. Consider abdomen MRI and MRCP without and with contrast for further evaluation if clinically warranted. Electronically Signed   By: Marlyce Sine M.D.   On: 06/06/2024 14:17      Rosena Conradi, MD  [facility] 06/08/2024  If 7PM-7AM, please contact night-coverage

## 2024-06-08 NOTE — Progress Notes (Signed)
 Subjective: Reports fullness in abdomen. Feels like she's bloated and carrying extra weight in abdomen.  Objective: Vital signs in last 24 hours: Temp:  [97.4 F (36.3 C)-98.1 F (36.7 C)] 97.4 F (36.3 C) (06/16 0521) Pulse Rate:  [80-103] 101 (06/16 0521) Resp:  [16-18] 16 (06/16 0521) BP: (98-118)/(60-70) 108/69 (06/16 0521) SpO2:  [63 %-96 %] 95 % (06/16 0521) Weight change:  Last BM Date : 06/06/24  PE: GEN:  Elderly, frail-appearing. HEENT:  /AT, dry mucous membranes ABD:  Mild protuberant, dullness lower abd to percussion c/w constipation SKIN:  Fragile skin, multiple ecchymoses.  Lab Results: CBC    Component Value Date/Time   WBC 14.1 (H) 06/08/2024 0521   RBC 3.83 (L) 06/08/2024 0521   HGB 11.5 (L) 06/08/2024 0521   HGB 11.3 (L) 04/23/2024 1306   HGB 13.1 12/10/2017 1000   HCT 35.5 (L) 06/08/2024 0521   HCT 38.8 12/10/2017 1000   PLT 257 06/08/2024 0521   PLT 336 04/23/2024 1306   PLT 265 12/10/2017 1000   MCV 92.7 06/08/2024 0521   MCV 93.7 12/10/2017 1000   MCH 30.0 06/08/2024 0521   MCHC 32.4 06/08/2024 0521   RDW 17.6 (H) 06/08/2024 0521   RDW 13.9 12/10/2017 1000   LYMPHSABS 0.5 (L) 06/06/2024 0817   LYMPHSABS 0.9 12/10/2017 1000   MONOABS 0.8 06/06/2024 0817   MONOABS 0.6 12/10/2017 1000   EOSABS 0.0 06/06/2024 0817   EOSABS 0.4 12/10/2017 1000   BASOSABS 0.0 06/06/2024 0817   BASOSABS 0.0 12/10/2017 1000   =CMP     Component Value Date/Time   NA 136 06/08/2024 0521   NA 142 12/10/2017 1000   K 4.4 06/08/2024 0521   K 4.0 12/10/2017 1000   CL 106 06/08/2024 0521   CL 108 (H) 06/03/2013 0939   CO2 22 06/08/2024 0521   CO2 28 12/10/2017 1000   GLUCOSE 94 06/08/2024 0521   GLUCOSE 86 12/10/2017 1000   GLUCOSE 85 06/03/2013 0939   BUN 27 (H) 06/08/2024 0521   BUN 20.4 12/10/2017 1000   CREATININE 0.90 06/08/2024 0521   CREATININE 0.88 04/23/2024 1306   CREATININE 0.9 12/10/2017 1000   CALCIUM  8.9 06/08/2024 0521   CALCIUM  9.7  12/10/2017 1000   PROT 5.8 (L) 06/07/2024 0639   PROT 6.4 12/10/2017 1000   ALBUMIN 2.8 (L) 06/07/2024 0639   ALBUMIN 3.6 12/10/2017 1000   AST 66 (H) 06/07/2024 0639   AST 29 04/23/2024 1306   AST 25 12/10/2017 1000   ALT 40 06/07/2024 0639   ALT 26 04/23/2024 1306   ALT 17 12/10/2017 1000   ALKPHOS 67 06/07/2024 0639   ALKPHOS 63 12/10/2017 1000   BILITOT 1.2 06/07/2024 0639   BILITOT 0.5 04/23/2024 1306   BILITOT 1.05 12/10/2017 1000   GFR 65.80 07/05/2016 1012   EGFR >60 12/10/2017 1000   GFRNONAA >60 06/08/2024 0521   GFRNONAA >60 04/23/2024 1306   Stool studies negative, including C. Diff.  Assessment:  Diarrhea, improving.  Stool studies negative. Mildly elevated LFTs with gallstones.  No bile duct stone or cholecystitis on MRCP.  Plan:   Check abdominal xray to assess for overflow diarrhea. Soft diet as tolerated. No plan/need for ERCP. [facility] will follow.   Yves Herb 06/08/2024, 9:35 AM   Cell [phone] If no answer or after 5 PM call [phone]

## 2024-06-08 NOTE — Plan of Care (Signed)
  Problem: Clinical Measurements: Goal: Will remain free from infection Outcome: Progressing   Problem: Elimination: Goal: Will not experience complications related to bowel motility Outcome: Progressing Goal: Will not experience complications related to urinary retention Outcome: Progressing   Problem: Pain Managment: Goal: General experience of comfort will improve and/or be controlled Outcome: Progressing   Problem: Safety: Goal: Ability to remain free from injury will improve Outcome: Progressing   Problem: Skin Integrity: Goal: Risk for impaired skin integrity will decrease Outcome: Progressing

## 2024-06-09 DIAGNOSIS — K521 Toxic gastroenteritis and colitis: Secondary | ICD-10-CM | POA: Diagnosis not present

## 2024-06-09 LAB — CBC
HCT: 34 % — ABNORMAL LOW (ref 36.0–46.0)
Hemoglobin: 11.5 g/dL — ABNORMAL LOW (ref 12.0–15.0)
MCH: 30.3 pg (ref 26.0–34.0)
MCHC: 33.8 g/dL (ref 30.0–36.0)
MCV: 89.5 fL (ref 80.0–100.0)
Platelets: 309 10*3/uL (ref 150–400)
RBC: 3.8 MIL/uL — ABNORMAL LOW (ref 3.87–5.11)
RDW: 17.2 % — ABNORMAL HIGH (ref 11.5–15.5)
WBC: 10.8 10*3/uL — ABNORMAL HIGH (ref 4.0–10.5)
nRBC: 0 % (ref 0.0–0.2)

## 2024-06-09 LAB — BASIC METABOLIC PANEL WITH GFR
Anion gap: 10 (ref 5–15)
BUN: 29 mg/dL — ABNORMAL HIGH (ref 8–23)
CO2: 25 mmol/L (ref 22–32)
Calcium: 9.1 mg/dL (ref 8.9–10.3)
Chloride: 100 mmol/L (ref 98–111)
Creatinine, Ser: 0.94 mg/dL (ref 0.44–1.00)
GFR, Estimated: 59 mL/min — ABNORMAL LOW (ref >60)
Glucose, Bld: 114 mg/dL — ABNORMAL HIGH (ref 70–99)
Potassium: 3.8 mmol/L (ref 3.5–5.1)
Sodium: 135 mmol/L (ref 135–145)

## 2024-06-09 NOTE — Plan of Care (Signed)
  Problem: Education: Goal: Knowledge of General Education information will improve Description: Including pain rating scale, medication(s)/side effects and non-pharmacologic comfort measures 06/09/2024 1733 by Len Quale, RN Outcome: Progressing 06/09/2024 1733 by Len Quale, RN Outcome: Progressing   Problem: Health Behavior/Discharge Planning: Goal: Ability to manage health-related needs will improve 06/09/2024 1733 by Len Quale, RN Outcome: Progressing 06/09/2024 1733 by Len Quale, RN Outcome: Progressing   Problem: Clinical Measurements: Goal: Ability to maintain clinical measurements within normal limits will improve 06/09/2024 1733 by Len Quale, RN Outcome: Progressing 06/09/2024 1733 by Len Quale, RN Outcome: Progressing Goal: Will remain free from infection 06/09/2024 1733 by Len Quale, RN Outcome: Progressing 06/09/2024 1733 by Len Quale, RN Outcome: Progressing Goal: Diagnostic test results will improve 06/09/2024 1733 by Len Quale, RN Outcome: Progressing 06/09/2024 1733 by Len Quale, RN Outcome: Progressing Goal: Respiratory complications will improve 06/09/2024 1733 by Len Quale, RN Outcome: Progressing 06/09/2024 1733 by Len Quale, RN Outcome: Progressing Goal: Cardiovascular complication will be avoided 06/09/2024 1733 by Len Quale, RN Outcome: Progressing 06/09/2024 1733 by Len Quale, RN Outcome: Progressing   Problem: Activity: Goal: Risk for activity intolerance will decrease 06/09/2024 1733 by Len Quale, RN Outcome: Progressing 06/09/2024 1733 by Len Quale, RN Outcome: Progressing   Problem: Nutrition: Goal: Adequate nutrition will be maintained 06/09/2024 1733 by Len Quale, RN Outcome: Progressing 06/09/2024 1733 by Len Quale, RN Outcome: Progressing   Problem: Coping: Goal: Level of anxiety will decrease 06/09/2024 1733 by Len Quale, RN Outcome:  Progressing 06/09/2024 1733 by Len Quale, RN Outcome: Progressing   Problem: Elimination: Goal: Will not experience complications related to bowel motility 06/09/2024 1733 by Len Quale, RN Outcome: Progressing 06/09/2024 1733 by Len Quale, RN Outcome: Progressing Goal: Will not experience complications related to urinary retention 06/09/2024 1733 by Len Quale, RN Outcome: Progressing 06/09/2024 1733 by Len Quale, RN Outcome: Progressing   Problem: Pain Managment: Goal: General experience of comfort will improve and/or be controlled 06/09/2024 1733 by Len Quale, RN Outcome: Progressing 06/09/2024 1733 by Len Quale, RN Outcome: Progressing   Problem: Safety: Goal: Ability to remain free from injury will improve 06/09/2024 1733 by Len Quale, RN Outcome: Progressing 06/09/2024 1733 by Len Quale, RN Outcome: Progressing   Problem: Skin Integrity: Goal: Risk for impaired skin integrity will decrease 06/09/2024 1733 by Len Quale, RN Outcome: Progressing 06/09/2024 1733 by Len Quale, RN Outcome: Progressing

## 2024-06-09 NOTE — Progress Notes (Signed)
 Urology scheduled visit  Patient states she is doing well, MRI demonstrates no hydronephrosis on the right side.  She denies any right flank pain.  Has some hip soreness post procedure but states that this is improving.  Allergy visit for cystoscopy visit will follow peripherally.

## 2024-06-09 NOTE — Plan of Care (Signed)

## 2024-06-09 NOTE — TOC Initial Note (Addendum)
 Transition of Care [facility]) - Initial/Assessment Note    Patient Details  Name: Dana [name] MRN: [id number] Date of Birth: Jun 26, 1935  Transition of Care [facility]) CM/SW Contact:    Loreda Rodriguez, RN Phone Number:[phone]  06/09/2024, 3:15 PM  Clinical Narrative:                 [facility] acknowledges consult for SNF placement. CM at bedside introduced self to. CM explained consult for SNF. Patient verbalizes understanding and  would like [facility] as first choice and may be open to other suggestions. CM to initiate SNF workup.   6/17 FL2 completed and faxed out for bed offers with [facility] being the first choice.         Patient Goals and CMS Choice            Expected Discharge Plan and Services         Expected Discharge Date: 06/09/24                                    Prior Living Arrangements/Services                       Activities of Daily Living   ADL Screening (condition at time of admission) Independently performs ADLs?: Yes (appropriate for developmental age) Is the patient deaf or have difficulty hearing?: No Does the patient have difficulty seeing, even when wearing glasses/contacts?: No Does the patient have difficulty concentrating, remembering, or making decisions?: No  Permission Sought/Granted                  Emotional Assessment              Admission diagnosis:  Diarrhea due to drug [K52.1] Fall, initial encounter [W19.XXXA] Diarrhea, unspecified type [R19.7] Patient Active Problem List   Diagnosis Date Noted   Diarrhea due to drug 06/06/2024   Transitional cell bladder cancer (HCC) 06/06/2024   Chronic pain of right knee 03/19/2024   Primary osteoarthritis of right knee 03/19/2024   Acute respiratory failure with hypoxia (HCC) 11/24/2023   Rheumatoid arthritis (HCC) 11/24/2023   Mycobacterial infection, unspecified 11/24/2023   Rheumatoid nodule (HCC) 11/24/2023   Sicca (HCC) 11/24/2023   Sjogren syndrome,  unspecified (HCC) 11/24/2023   Mild protein malnutrition (HCC) 11/24/2023   Hypocalcemia 11/24/2023   Elevated troponin 11/24/2023   Heart failure with preserved left ventricular function (HFpEF) (HCC) 11/24/2023   Pulmonary embolism (HCC) 11/24/2023   Recurrent epistaxis 08/20/2023   Hypokalemia 12/26/2022   Hypomagnesemia 12/26/2022   Acute upper respiratory infection 12/26/2022   Protein-calorie malnutrition, severe 12/26/2022   CAP (community acquired pneumonia) 12/25/2022   Vocal cord atrophy 01/30/2021   Dysphonia 01/30/2021   AKI (acute kidney injury) (HCC) 09/22/2020   Diarrhea 09/22/2020   Hypotension 09/22/2020   C. difficile diarrhea 09/22/2020   Choledocholithiasis 09/21/2020   COPD GOLD 0/ lama/laba responsive 07/11/2019   GERD clinical dx only  07/11/2019   HTN (hypertension) 10/10/2017   Imbalance 06/04/2017   Depression 04/04/2017   Left ankle injury, initial encounter 02/28/2017   Encounter for therapeutic drug monitoring 10/01/2016   DOE (dyspnea on exertion) 07/05/2016   Pleural effusion, right 07/13/2014   Right arm cellulitis 02/03/2014   Cancer of right lung parenchyma (HCC) 11/12/2011   PCP:  [name], MD Pharmacy:   [facility] [address] [place], [place] - [address] [place] [address] Phone: [phone] Fax: [phone]     Social Drivers of Health (SDOH) Social History: SDOH Screenings   Food Insecurity: No Food Insecurity (06/06/2024)  Housing: Low Risk  (06/06/2024)  Transportation Needs: No Transportation Needs (06/06/2024)  Utilities: Not At Risk (06/06/2024)  Social Connections: Unknown (06/07/2024)  Tobacco Use: Medium Risk (06/07/2024)   SDOH Interventions:     Readmission Risk Interventions    06/07/2024    9:53 AM 11/26/2023   11:08 AM  Readmission Risk Prevention Plan  Transportation Screening Complete Complete  PCP or Specialist Appt within 5-7 Days Complete   PCP or Specialist  Appt within 3-5 Days  Complete  Home Care Screening Complete   Medication Review (RN CM) Complete   HRI or Home Care Consult  Complete  Social Work Consult for Recovery Care Planning/Counseling  Complete  Palliative Care Screening  Not Applicable  Medication Review Oceanographer)  Referral to Pharmacy

## 2024-06-09 NOTE — [place] FL2 (Signed)
 [place]  MEDICAID FL2 LEVEL OF CARE FORM     IDENTIFICATION  Patient Name: Dana [name] Birthdate: 1935/05/19 Sex: female Admission Date (Current Location): 06/06/2024  [facility] and [place] Number:  Producer, television/film/video and Address:  [facility],  [address] [place], [place] [address]      Provider Number: [id number]  Attending Physician Name and Address:  Rosena Conradi, MD  Relative Name and Phone Number:  Alfonzo Ill     [phone]    Current Level of Care: Hospital Recommended Level of Care: Skilled Nursing Facility Prior Approval Number:    Date Approved/Denied:   PASRR Number: [id number]  Discharge Plan: SNF    Current Diagnoses: Patient Active Problem List   Diagnosis Date Noted   Diarrhea due to drug 06/06/2024   Transitional cell bladder cancer (HCC) 06/06/2024   Chronic pain of right knee 03/19/2024   Primary osteoarthritis of right knee 03/19/2024   Acute respiratory failure with hypoxia (HCC) 11/24/2023   Rheumatoid arthritis (HCC) 11/24/2023   Mycobacterial infection, unspecified 11/24/2023   Rheumatoid nodule (HCC) 11/24/2023   Sicca (HCC) 11/24/2023   Sjogren syndrome, unspecified (HCC) 11/24/2023   Mild protein malnutrition (HCC) 11/24/2023   Hypocalcemia 11/24/2023   Elevated troponin 11/24/2023   Heart failure with preserved left ventricular function (HFpEF) (HCC) 11/24/2023   Pulmonary embolism (HCC) 11/24/2023   Recurrent epistaxis 08/20/2023   Hypokalemia 12/26/2022   Hypomagnesemia 12/26/2022   Acute upper respiratory infection 12/26/2022   Protein-calorie malnutrition, severe 12/26/2022   CAP (community acquired pneumonia) 12/25/2022   Vocal cord atrophy 01/30/2021   Dysphonia 01/30/2021   AKI (acute kidney injury) (HCC) 09/22/2020   Diarrhea 09/22/2020   Hypotension 09/22/2020   C. difficile diarrhea 09/22/2020   Choledocholithiasis 09/21/2020   COPD GOLD 0/ lama/laba responsive 07/11/2019   GERD clinical dx only   07/11/2019   HTN (hypertension) 10/10/2017   Imbalance 06/04/2017   Depression 04/04/2017   Left ankle injury, initial encounter 02/28/2017   Encounter for therapeutic drug monitoring 10/01/2016   DOE (dyspnea on exertion) 07/05/2016   Pleural effusion, right 07/13/2014   Right arm cellulitis 02/03/2014   Cancer of right lung parenchyma (HCC) 11/12/2011    Orientation RESPIRATION BLADDER Height & Weight     Self, Situation, Place  Normal Continent Weight: 45.4 kg Height:  5' 2 (157.5 cm)  BEHAVIORAL SYMPTOMS/MOOD NEUROLOGICAL BOWEL NUTRITION STATUS     (n/a) Continent Diet (Heart healthy)  AMBULATORY STATUS COMMUNICATION OF NEEDS Skin     Verbally Other (Comment) (dry flaky redness  to anus bilateral arms and hands)                       Personal Care Assistance Level of Assistance  Bathing, Feeding, Dressing Bathing Assistance: Maximum assistance Feeding assistance: Limited assistance Dressing Assistance: Maximum assistance     Functional Limitations Info  Sight, Hearing, Speech Sight Info: Impaired (eyeglasses) Hearing Info: Adequate Speech Info: Adequate    SPECIAL CARE FACTORS FREQUENCY  PT (By licensed PT), OT (By licensed OT)     PT Frequency: 5x/wk OT Frequency: 5x/wk            Contractures Contractures Info: Not present    Additional Factors Info  Code Status, Allergies, Psychotropic, Insulin Sliding Scale, Isolation Precautions Code Status Info: DNR Allergies Info: Methotrexate, Clindamycin  Hcl, Lactose Intolerance (Gi), Amoxicillin, Protonix  (Pantoprazole  Sodium) Psychotropic Info: see d/c summary Insulin Sliding Scale Info: see d/c summary Isolation Precautions Info: n/a  Current Medications (06/09/2024):  This is the current hospital active medication list Current Facility-Administered Medications  Medication Dose Route Frequency Provider Last Rate Last Admin   acetaminophen  (TYLENOL ) tablet 650 mg  650 mg Oral Q6H PRN Danice Dural, MD   650 mg at 06/09/24 1015   Or   acetaminophen  (TYLENOL ) suppository 650 mg  650 mg Rectal Q6H PRN Danice Dural, MD       arformoterol  (BROVANA ) nebulizer solution 15 mcg  15 mcg Nebulization BID Danice Dural, MD   15 mcg at 06/09/24 1003   And   umeclidinium bromide  (INCRUSE ELLIPTA ) 62.5 MCG/ACT 1 puff  1 puff Inhalation Daily Danice Dural, MD   1 puff at 06/09/24 1003   bismuth subsalicylate (PEPTO BISMOL) chewable tablet 262 mg  262 mg Oral PRN Danice Dural, MD       calcium -vitamin D  (OSCAL WITH D) 500-5 MG-MCG per tablet 1 tablet  1 tablet Oral BID Danice Dural, MD   1 tablet at 06/09/24 1012   cefTRIAXone  (ROCEPHIN ) 1 g in sodium chloride  0.9 % 100 mL IVPB  1 g Intravenous Q24H Danice Dural, MD   Stopped at 06/08/24 1925   enoxaparin  (LOVENOX ) injection 30 mg  30 mg Subcutaneous Q24H Danice Dural, MD   30 mg at 06/08/24 2125   loratadine  (CLARITIN ) tablet 10 mg  10 mg Oral Daily PRN Danice Dural, MD       metroNIDAZOLE (FLAGYL) IVPB 500 mg  500 mg Intravenous Q12H Danice Dural, MD 100 mL/hr at 06/09/24 0731 500 mg at 06/09/24 0731   mirtazapine  (REMERON ) tablet 15 mg  15 mg Oral QHS Danice Dural, MD   15 mg at 06/08/24 2124   ondansetron  (ZOFRAN ) tablet 4 mg  4 mg Oral Q6H PRN Danice Dural, MD       Or   ondansetron  (ZOFRAN ) injection 4 mg  4 mg Intravenous Q6H PRN Danice Dural, MD       sodium chloride  flush (NS) 0.9 % injection 10-40 mL  10-40 mL Intracatheter PRN Pokhrel, Laxman, MD         Discharge Medications: Please see discharge summary for a list of discharge medications.  Relevant Imaging Results:  Relevant Lab Results:   Additional Information SS# [id number]  Loreda Rodriguez, RN

## 2024-06-09 NOTE — Evaluation (Signed)
 Occupational Therapy Evaluation Patient Details Name: Dana [name] MRN: [id number] DOB: [birth year] Today's Date: 06/09/2024   History of Present Illness   Patient is a 89 year old female who was admitted with drug induced diarrhea. NGE:XBMWUX, osteoarthritis, CAP, diastolic CHF, COPD, interstitial lung disease depression, GERD, nephrolithiasis, lung cancer, pulmonary embolism, hypertension, osteoporosis, Sjogren syndrome, history of C. difficile x 2     Clinical Impressions Patient is a 89 year old female who was admitted for above. Patient lives at home with husband with independence in ADLs with rollator per patient report. Patient was not receptive to education during session. Patient reported using brace on LLE normally at home but not having it here with the frequent diarrhea. Patient was noted to have decreased ability to use RW with consistent cues needed for safety. Patient also having continuous loose stool during session with hygiene completed x3 during session. Patient made no efforts to engage in ADL tasks even when cued to engage in them. Patient would need 24/7 caregiver support in the next level of care. Patient needing min A for standing balance to transition from bed to toilet in bathroom and then to recliner. Patient will benefit from continued inpatient follow up therapy, <3 hours/day.      If plan is discharge home, recommend the following:   A lot of help with bathing/dressing/bathroom;A lot of help with walking and/or transfers;Assistance with cooking/housework;Direct supervision/assist for medications management;Assist for transportation;Help with stairs or ramp for entrance;Direct supervision/assist for financial management     Functional Status Assessment   Patient has had a recent decline in their functional status and/or demonstrates limited ability to make significant improvements in function in a reasonable and predictable amount of time     Equipment  Recommendations   None recommended by OT      Precautions/Restrictions   Precautions Precautions: Fall Restrictions Weight Bearing Restrictions Per Provider Order: No     Mobility Bed Mobility Overal bed mobility: Needs Assistance Bed Mobility: Supine to Sit     Supine to sit: Mod assist     General bed mobility comments: with cues for proper hand and foot placement to advacne to EOB.            Balance Overall balance assessment: Needs assistance   Sitting balance-Leahy Scale: Fair     Standing balance support: Reliant on assistive device for balance, Bilateral upper extremity supported Standing balance-Leahy Scale: Poor         ADL either performed or assessed with clinical judgement   ADL Overall ADL's : Needs assistance/impaired   Eating/Feeding Details (indicate cue type and reason): patient refusing to eat at this time. then complaining that she has nothing in her stomach. Grooming: Wash/dry hands;Standing;Contact guard assist   Upper Body Bathing: Sitting;Minimal assistance Upper Body Bathing Details (indicate cue type and reason): patient made no efforts to engage in this task. Lower Body Bathing: Sitting/lateral leans;Total assistance Lower Body Bathing Details (indicate cue type and reason): patietn was handed wash cloth to wash thighs while seated on commode with patient making no efforts to complete task. patient waitihg for therapsit to don/doff socks for patient. patient continued to endorse that she does everything for herself at home. Upper Body Dressing : Set up;Sitting   Lower Body Dressing: Sitting/lateral leans;Total assistance Lower Body Dressing Details (indicate cue type and reason): made no efforts to engage in these tasks after needing socks to be changed x2 during session with consistent loose BM with movement. Toilet Transfer: Minimal  assistance;Ambulation;Rolling walker (2 wheels) Toilet Transfer Details (indicate cue type and  reason): with use of RW. patient reporting that her personal rollator in her room was not hers. patient also reporting that she typically wears a knee brace but does not have one with her because she has been having so much diarrhea. patient was educated on using RW v.s. rolaltor with patietn reporting if i had my rollator i would be safer than this. patient not receptive to any education on compensatory strategies. patietn needing cues to keep both feet insight walker wheels with patient frequently positioning her LLE outside of walker with movements. Toileting- Architect and Hygiene: Sit to/from stand;Total assistance Toileting - Clothing Manipulation Details (indicate cue type and reason): patietn made no efforts to assist with this task x3 completed during session.             Vision Baseline Vision/History: 1 Wears glasses              Pertinent Vitals/Pain Pain Assessment Pain Assessment: No/denies pain     Extremity/Trunk Assessment Upper Extremity Assessment Upper Extremity Assessment: Overall WFL for tasks assessed   Lower Extremity Assessment Lower Extremity Assessment: Defer to PT evaluation   Cervical / Trunk Assessment Cervical / Trunk Assessment: Kyphotic      Cognition Arousal: Alert Behavior During Therapy: Flat affect Cognition: Cognition impaired     Awareness: Intellectual awareness impaired, Online awareness impaired Memory impairment (select all impairments): Working memory Attention impairment (select first level of impairment): Selective attention Executive functioning impairment (select all impairments): Sequencing, Reasoning, Problem solving OT - Cognition Comments: patient had poor awareness to deficits and was not receptive to education on compensatory strategies.                 Following commands: Impaired                  Home Living Family/patient expects to be discharged to:: Private residence Living  Arrangements: Spouse/significant other Available Help at Discharge: Family;Available 24 hours/day Type of Home: House Home Access: Stairs to enter [employer] of Steps: 1   Home Layout: One level     Bathroom Shower/Tub: Chief Strategy Officer: Standard     Home Equipment: Rollator (4 wheels);Cane - single point;Shower seat;Hand held shower head;Adaptive equipment Adaptive Equipment: Reacher Additional Comments: family assists with getting groceries and appointments, husband can physically assist, but requires reminders      Prior Functioning/Environment Prior Level of Function : Needs assist             Mobility Comments: uses rollator for ambulation outside, or to carry items in house. ADLs Comments: mod I for self care, makes microwave meals, has a housekeeper for heavy work, kids help with errands    OT Problem List: Decreased strength;Impaired balance (sitting and/or standing);Decreased knowledge of precautions;Decreased safety awareness;Cardiopulmonary status limiting activity;Decreased activity tolerance;Decreased knowledge of use of DME or AE   OT Treatment/Interventions: Therapeutic activities;Self-care/ADL training;DME and/or AE instruction;Balance training;Energy conservation;Patient/family education      OT Goals(Current goals can be found in the care plan section)   Acute Rehab OT Goals Patient Stated Goal: to get back home OT Goal Formulation: Patient unable to participate in goal setting Time For Goal Achievement: 06/23/24 Potential to Achieve Goals: Fair   OT Frequency:  Min 2X/week    Co-evaluation PT/OT/SLP Co-Evaluation/Treatment: Yes Reason for Co-Treatment: For patient/therapist safety PT goals addressed during session: Mobility/safety with mobility OT goals addressed during session: ADL's  and self-care      AM-PAC OT 6 Clicks Daily Activity     Outcome Measure Help from another person eating meals?: None Help from  another person taking care of personal grooming?: A Little Help from another person toileting, which includes using toliet, bedpan, or urinal?: A Lot Help from another person bathing (including washing, rinsing, drying)?: A Lot Help from another person to put on and taking off regular upper body clothing?: A Little Help from another person to put on and taking off regular lower body clothing?: A Lot 6 Click Score: 16   End of Session Equipment Utilized During Treatment: Gait belt;Rolling walker (2 wheels) Nurse Communication: Mobility status  Activity Tolerance: Patient tolerated treatment well Patient left: in chair;with call bell/phone within reach  OT Visit Diagnosis: Unsteadiness on feet (R26.81);Other abnormalities of gait and mobility (R26.89)                Time: 1610-9604 OT Time Calculation (min): 42 min Charges:  OT General Charges $OT Visit: 1 Visit OT Evaluation $OT Eval Moderate Complexity: 1 Mod OT Treatments $Self Care/Home Management : 8-22 mins  Wynette Heckler, MS Acute Rehabilitation Department Office# [phone]   Jame Maze 06/09/2024, 11:54 AM

## 2024-06-09 NOTE — Evaluation (Signed)
 Physical Therapy Evaluation Patient Details Name: Dana [name] MRN: [id number] DOB: December 24, 1935 Today's Date: 06/09/2024  History of Present Illness  Patient is a 89 year old female who was admitted with drug induced diarrhea. WUJ:WJXBJY, osteoarthritis, CAP, diastolic CHF, COPD, interstitial lung disease depression, GERD, nephrolithiasis, lung cancer, pulmonary embolism, hypertension, osteoporosis, Sjogren syndrome, history of C. difficile x 2  Clinical Impression     Pt admitted with above diagnosis.  Pt currently with functional limitations due to the deficits listed below (see PT Problem List). Pt arrived and pt sitting EOB with OT. Pt appears to be slightly confused as demonstrated by not knowing personal Rollator was in room, limited ability to provide insight to PLOF and safety awareness. Pt encouraged to use RW for safety and stability especially due to pt report of wearing R knee brace in home setting with ambulation. Pt required min A for transfer tasks, min A for gait tasks with RW strong cues for safety, proper body placement inside RW and obstacle navigation in personal room 10 and 15 feet. Pt left seated in recliner, set up for breakfast and all needs in place. Nurse aware of apparent confusion and ongoing episodes of loose bowels. .skileld. Pt will benefit from acute skilled PT to increase their independence and safety with mobility to allow discharge.         If plan is discharge home, recommend the following: A little help with walking and/or transfers;A lot of help with bathing/dressing/bathroom;Assistance with cooking/housework;Assist for transportation;Help with stairs or ramp for entrance   Can travel by private vehicle   Yes    Equipment Recommendations Other (comment) (defer to next venue)  Recommendations for Other Services       Functional Status Assessment Patient has had a recent decline in their functional status and demonstrates the ability to make significant  improvements in function in a reasonable and predictable amount of time.     Precautions / Restrictions Precautions Precautions: Fall Restrictions Weight Bearing Restrictions Per Provider Order: No      Mobility  Bed Mobility Overal bed mobility: Needs Assistance Bed Mobility: Supine to Sit     Supine to sit: Mod assist     General bed mobility comments: with cues for proper hand and foot placement to advacne to EOB.    Transfers Overall transfer level: Needs assistance Equipment used: Rolling walker (2 wheels) Transfers: Sit to/from Stand Sit to Stand: Min assist           General transfer comment: min A for balance and for RW management, pt indicated use of rollator at baseline and use of R knee brace not present in hospital with pt indicating episodes of loose bowels in home setting soiling brace, PT and OT strongly recommend use of RW for safety and stabiltiy pt reluctant for use of RW    Ambulation/Gait Ambulation/Gait assistance: Min assist Gait Distance (Feet): 15 Feet Assistive device: Rolling walker (2 wheels) Gait Pattern/deviations: Step-through pattern, Trunk flexed Gait velocity: decresed     General Gait Details: min A for RW management with pt demonstrating difficutly with obstacle navigation espeically on the R side, cues for posture and proper body position inside RW, noted R toe out and no apparent R LE instabiltiy with pt reporting use of R knee brace in home setting  Stairs            Wheelchair Mobility     Tilt Bed    Modified Rankin (Stroke Patients Only)  Balance Overall balance assessment: Needs assistance   Sitting balance-Leahy Scale: Fair     Standing balance support: Reliant on assistive device for balance, Bilateral upper extremity supported Standing balance-Leahy Scale: Poor                               Pertinent Vitals/Pain Pain Assessment Pain Assessment: No/denies pain    Home Living  Family/patient expects to be discharged to:: Private residence Living Arrangements: Spouse/significant other Available Help at Discharge: Family;Available 24 hours/day Type of Home: House Home Access: Stairs to enter   [employer] of Steps: 1   Home Layout: One level Home Equipment: Rollator (4 wheels);Cane - single point;Shower seat;Hand held shower head;Adaptive equipment Additional Comments: family assists with getting groceries and appointments, husband can physically assist, but requires reminders    Prior Function Prior Level of Function : Needs assist             Mobility Comments: uses rollator for ambulation outside, or to carry items in house. ADLs Comments: mod I for self care, makes microwave meals, has a housekeeper for heavy work, kids help with errands     Extremity/Trunk Assessment   Upper Extremity Assessment Upper Extremity Assessment: Defer to OT evaluation    Lower Extremity Assessment Lower Extremity Assessment: Generalized weakness    Cervical / Trunk Assessment Cervical / Trunk Assessment: Kyphotic  Communication   Communication Communication: No apparent difficulties    Cognition Arousal: Alert Behavior During Therapy: Flat affect   PT - Cognitive impairments: Awareness, Attention, Memory                         Following commands: Impaired       Cueing Cueing Techniques: Verbal cues, Gestural cues, Tactile cues, Visual cues     General Comments      Exercises     Assessment/Plan    PT Assessment Patient needs continued PT services  PT Problem List Decreased strength;Decreased activity tolerance;Decreased balance;Decreased mobility;Decreased coordination       PT Treatment Interventions DME instruction;Gait training;Functional mobility training;Therapeutic activities;Therapeutic exercise;Balance training;Neuromuscular re-education;Patient/family education;Cognitive remediation;Stair training    PT Goals  (Current goals can be found in the Care Plan section)  Acute Rehab PT Goals Patient Stated Goal: eat something and not fall PT Goal Formulation: With patient Time For Goal Achievement: 06/23/24 Potential to Achieve Goals: Good    Frequency Min 3X/week     Co-evaluation   Reason for Co-Treatment: For patient/therapist safety PT goals addressed during session: Mobility/safety with mobility OT goals addressed during session: ADL's and self-care       AM-PAC PT 6 Clicks Mobility  Outcome Measure Help needed turning from your back to your side while in a flat bed without using bedrails?: A Lot Help needed moving from lying on your back to sitting on the side of a flat bed without using bedrails?: A Little Help needed moving to and from a bed to a chair (including a wheelchair)?: A Little Help needed standing up from a chair using your arms (e.g., wheelchair or bedside chair)?: A Little Help needed to walk in hospital room?: A Little Help needed climbing 3-5 steps with a railing? : Total 6 Click Score: 15    End of Session Equipment Utilized During Treatment: Gait belt Activity Tolerance: Patient tolerated treatment well Patient left: in chair;with call bell/phone within reach Nurse Communication: Mobility status;Other (comment) (confusion noted)  PT Visit Diagnosis: Unsteadiness on feet (R26.81);Other abnormalities of gait and mobility (R26.89);Muscle weakness (generalized) (M62.81);Difficulty in walking, not elsewhere classified (R26.2)    Time: 1610-9604 PT Time Calculation (min) (ACUTE ONLY): 25 min   Charges:   PT Evaluation $PT Eval Low Complexity: 1 Low   PT General Charges $$ ACUTE PT VISIT: 1 Visit         Cary Clarks, PT Acute Rehab   Annalee Kiang 06/09/2024, 1:26 PM

## 2024-06-09 NOTE — Progress Notes (Signed)
 PROGRESS NOTE  Dana [name]:[id number] DOB: [birth year] DOA: 06/06/2024 PCP: [name], MD   LOS: 3 days   Brief narrative:  Dana [name] is a 89 y.o. female with past medical history significant of anemia, osteoarthritis, CAP, diastolic CHF, COPD, interstitial lung disease depression, GERD, nephrolithiasis, lung cancer, pulmonary embolism, hypertension, osteoporosis, Sjogren syndrome, history of C. difficile x 2 presented to hospital with near fall in the bathroom injuring her right forearm and right pretibial area.  Patient had multiple episodes of diarrhea after starting antibiotic following  resection of bladder tumor 4 days back.  In the ED vitals were stable.  Labs showed CBC of 10.5 hemoglobin 10.7.Urinalysis with moderate hemoglobin, trace leukocyte esterase and a few bacteria.  C. difficile antigen and toxin were negative.   Coronavirus, influenza and RSV PCR were negative.  Lactic acid was normal.  Lipase was 298 units/L.  Magnesium  1.3 mg/dL.  LFT with AST 812 and ALT 46 units/L. Portable chest radiograph with no acute cardiopulmonary abnormality.  Similar appearance of the right lung status post treated for right pulmonary malignancy.  Ultrasound of the abdomen Limited visualization of the gallbladder.  No gallstone identified.  Mild dilatation of the CBD measuring 9 mm.  MRCP suggested.  Patient was then admitted to the hospital for further evaluation and treatment.     Assessment/Plan: Principal Problem:   Diarrhea due to drug Active Problems:   Cancer of right lung parenchyma (HCC)   Depression   HTN (hypertension)   GERD clinical dx only    Choledocholithiasis   Hypomagnesemia   Rheumatoid arthritis (HCC)   Sicca (HCC)   Sjogren syndrome, unspecified (HCC)   Mild protein malnutrition (HCC)   Heart failure with preserved left ventricular function (HFpEF) (HCC)   Transitional cell bladder cancer (HCC)  Diarrhea likely leakage from rectal impaction  During  hospitalization patient received IV fluids.  C. difficile toxin was negative.  GI pathogen panel was negative.  Was initially on Rocephin  and Flagyl.  GI was patient during hospitalization.  Abdominal x-ray with possible rectal impaction.  Likely overflow diarrhea.  Enema was given and had good bowel movement.  Will need good bowel regimen on discharge.  Blood cultures negative in 2 days.  Elevated LFTs with dilated CBD on ultrasound.  GI recommended MRCP.  MRCP showed mild intra and extrahepatic biliary dilatation CBD up to 3.7 cm without calculus.  This is unchanged compared to previous MRI.  GI following.  LFTs are improving.   Transitional cell bladder cancer Had flexible cystoscopy with urology 5 days ago.  Plan for outpatient follow-up with urology as outpatient.  Biopsy was good as per the patient's family.  Had BCG in the past.     Cancer of right lung parenchyma  Follow-up with oncology as outpatient.  Has been on Tarceva  for 15 years.  Controlled as per the family.     Depression Continue mirtazapine      HTN (hypertension) Antihypertensives on hold at this time.  Blood pressure is borderline low.  Will continue to monitor.     GERD      Hypomagnesemia Received IV magnesium  sulfate for magnesium  of 1.5 yesterday.  Will check magnesium  level in AM.    Rheumatoid arthritis with  Sicca    Sjogren syndrome, unspecified  Continue Systane drops.  Follow-up with rheumatology as outpatient.    Heart failure with preserved left ventricular function (HFpEF)  Currently compensated.  Debility deconditioning, was seen by PT OT and recommended skilled  nursing facility placement.  Will get TOC consultation.  DVT prophylaxis: enoxaparin  (LOVENOX ) injection 30 mg Start: 06/06/24 2200   Disposition: Skilled nursing facility as per PT evaluation.  Patient was initially reluctant but at this time has agreed to go to [facility] if able.  Will consult TOC.  Medically stable for  disposition.  Status is: Inpatient Remains inpatient appropriate because: Need for rehab medicine and placement.    Code Status:     Code Status: Do not attempt resuscitation (DNR) PRE-ARREST INTERVENTIONS DESIRED  Family Communication: I spoke with the patient's daughter in law on the phone on 06/09/2024.  She agrees to short-term rehab.  Consultants: None  Procedures: None  Anti-infectives:  Rocephin  and Flagyl IV  Anti-infectives (From admission, onward)    Start     Dose/Rate Route Frequency Ordered Stop   06/07/24 1700  cefTRIAXone  (ROCEPHIN ) 1 g in sodium chloride  0.9 % 100 mL IVPB        1 g 200 mL/hr over 30 Minutes Intravenous Every 24 hours 06/06/24 1603     06/06/24 1600  metroNIDAZOLE (FLAGYL) IVPB 500 mg        500 mg 100 mL/hr over 60 Minutes Intravenous Every 12 hours 06/06/24 1535     06/06/24 1445  cefTRIAXone  (ROCEPHIN ) 1 g in sodium chloride  0.9 % 100 mL IVPB        1 g 200 mL/hr over 30 Minutes Intravenous  Once 06/06/24 1433 06/06/24 1803       Subjective:  Today, patient was seen and examined at bedside.  Patient feels better today after enema and had good bowel movement.  Still with generalized weakness.  PT recommended skilled nursing facility.   Objective: Vitals:   06/09/24 1003 06/09/24 1010  BP:    Pulse:    Resp:    Temp:    SpO2: 93% 92%    Intake/Output Summary (Last 24 hours) at 06/09/2024 1151 Last data filed at 06/09/2024 0133 Gross per 24 hour  Intake 660 ml  Output 100 ml  Net 560 ml   Filed Weights   06/06/24 0729  Weight: 45.4 kg   Body mass index is 18.29 kg/m.   Physical Exam:  General:  Average built, not in obvious distress, thinly built, Communicative, deconditioned HENT:   No scleral pallor or icterus noted. Oral mucosa is moist.  Chest:  Clear breath sounds.  Diminished breath sounds bilaterally CVS: S1 &S2 heard. No murmur.  Regular rate and rhythm. Abdomen: Soft, nontender, distended abdomen.  Bowel  sounds are heard.   Extremities: No cyanosis, clubbing or edema.  Peripheral pulses are palpable. Psych: Alert, awake and oriented, normal mood CNS:  No cranial nerve deficits.  Generalized weakness noted. Skin: Warm and dry.  No rashes noted.  Data Review: I have personally reviewed the following laboratory data and studies,  CBC: Recent Labs  Lab 06/06/24 0817 06/07/24 0639 06/08/24 0521 06/09/24 0620  WBC 10.5 18.8* 14.1* 10.8*  NEUTROABS 9.1*  --   --   --   HGB 10.7* 12.6 11.5* 11.5*  HCT 31.7* 38.8 35.5* 34.0*  MCV 89.0 92.4 92.7 89.5  PLT 286 292 257 309   Basic Metabolic Panel: Recent Labs  Lab 06/06/24 1200 06/06/24 1806 06/07/24 0639 06/08/24 0521 06/09/24 0620  NA 136  --  137 136 135  K 3.7  --  3.6 4.4 3.8  CL 101  --  102 106 100  CO2 26  --  24 22 25  GLUCOSE 98  --  82 94 114*  BUN 29*  --  20 27* 29*  CREATININE 0.69  --  0.69 0.90 0.94  CALCIUM  9.6  --  9.4 8.9 9.1  MG 1.3*  --   --  1.5*  --   PHOS  --  2.5  --   --   --    Liver Function Tests: Recent Labs  Lab 06/06/24 1200 06/07/24 0639  AST 112* 66*  ALT 46* 40  ALKPHOS 56 67  BILITOT 1.3* 1.2  PROT 5.5* 5.8*  ALBUMIN 3.0* 2.8*   Recent Labs  Lab 06/06/24 1200  LIPASE 298*   No results for input(s): AMMONIA in the last 168 hours. Cardiac Enzymes: No results for input(s): CKTOTAL, CKMB, CKMBINDEX, TROPONINI in the last 168 hours. BNP (last 3 results) Recent Labs    11/24/23 1040  BNP 163.6*    ProBNP (last 3 results) No results for input(s): PROBNP in the last 8760 hours.  CBG: No results for input(s): GLUCAP in the last 168 hours. Recent Results (from the past 240 hours)  Resp panel by RT-PCR (RSV, Flu A&B, Covid) Anterior Nasal Swab     Status: None   Collection Time: 06/06/24  8:17 AM   Specimen: Anterior Nasal Swab  Result Value Ref Range Status   SARS Coronavirus 2 by RT PCR NEGATIVE NEGATIVE Final    Comment: (NOTE) SARS-CoV-2 target nucleic  acids are NOT DETECTED.  The SARS-CoV-2 RNA is generally detectable in upper respiratory specimens during the acute phase of infection. The lowest concentration of SARS-CoV-2 viral copies this assay can detect is 138 copies/mL. A negative result does not preclude SARS-Cov-2 infection and should not be used as the sole basis for treatment or other patient management decisions. A negative result may occur with  improper specimen collection/handling, submission of specimen other than nasopharyngeal swab, presence of viral mutation(s) within the areas targeted by this assay, and inadequate number of viral copies(<138 copies/mL). A negative result must be combined with clinical observations, patient history, and epidemiological information. The expected result is Negative.  Fact Sheet for Patients:  BloggerCourse.com  Fact Sheet for Healthcare Providers:  SeriousBroker.it  This test is no t yet approved or cleared by the [place]  FDA and  has been authorized for detection and/or diagnosis of SARS-CoV-2 by FDA under an Emergency Use Authorization (EUA). This EUA will remain  in effect (meaning this test can be used) for the duration of the COVID-19 declaration under Section 564(b)(1) of the Act, 21 U.S.C.section 360bbb-3(b)(1), unless the authorization is terminated  or revoked sooner.       Influenza A by PCR NEGATIVE NEGATIVE Final   Influenza B by PCR NEGATIVE NEGATIVE Final    Comment: (NOTE) The Xpert Xpress SARS-CoV-2/FLU/RSV plus assay is intended as an aid in the diagnosis of influenza from Nasopharyngeal swab specimens and should not be used as a sole basis for treatment. Nasal washings and aspirates are unacceptable for Xpert Xpress SARS-CoV-2/FLU/RSV testing.  Fact Sheet for Patients: BloggerCourse.com  Fact Sheet for Healthcare Providers: SeriousBroker.it  This  test is not yet approved or cleared by the [place]  FDA and has been authorized for detection and/or diagnosis of SARS-CoV-2 by FDA under an Emergency Use Authorization (EUA). This EUA will remain in effect (meaning this test can be used) for the duration of the COVID-19 declaration under Section 564(b)(1) of the Act, 21 U.S.C. section 360bbb-3(b)(1), unless the authorization is terminated or revoked.  Resp Syncytial Virus by PCR NEGATIVE NEGATIVE Final    Comment: (NOTE) Fact Sheet for Patients: BloggerCourse.com  Fact Sheet for Healthcare Providers: SeriousBroker.it  This test is not yet approved or cleared by the [place]  FDA and has been authorized for detection and/or diagnosis of SARS-CoV-2 by FDA under an Emergency Use Authorization (EUA). This EUA will remain in effect (meaning this test can be used) for the duration of the COVID-19 declaration under Section 564(b)(1) of the Act, 21 U.S.C. section 360bbb-3(b)(1), unless the authorization is terminated or revoked.  Performed at [facility], [address], [place], [place] [address]   Blood culture (routine x 2)     Status: None (Preliminary result)   Collection Time: 06/06/24  1:07 PM   Specimen: BLOOD LEFT HAND  Result Value Ref Range Status   Specimen Description   Final    BLOOD LEFT HAND Performed at [facility], [address], [place], [place] [address]    Special Requests   Final    BOTTLES DRAWN AEROBIC ONLY Blood Culture results may not be optimal due to an inadequate volume of blood received in culture bottles Performed at [facility], [address], [place], [place] [address]    Culture   Final    NO GROWTH 3 DAYS Performed at [facility], [address], [place], [place] [address]    Report Status PENDING  Incomplete  C Difficile Quick Screen w PCR reflex     Status: None   Collection  Time: 06/06/24  1:56 PM   Specimen: Stool  Result Value Ref Range Status   C Diff antigen NEGATIVE NEGATIVE Final   C Diff toxin NEGATIVE NEGATIVE Final   C Diff interpretation No C. difficile detected.  Final    Comment: Performed at [facility], [address] [place], [place] [address]  Gastrointestinal Panel by PCR , Stool     Status: None   Collection Time: 06/06/24  1:57 PM   Specimen: Stool  Result Value Ref Range Status   Campylobacter species NOT DETECTED NOT DETECTED Final   Plesimonas shigelloides NOT DETECTED NOT DETECTED Final   Salmonella species NOT DETECTED NOT DETECTED Final   Yersinia enterocolitica NOT DETECTED NOT DETECTED Final   Vibrio species NOT DETECTED NOT DETECTED Final   Vibrio cholerae NOT DETECTED NOT DETECTED Final   Enteroaggregative E coli (EAEC) NOT DETECTED NOT DETECTED Final   Enteropathogenic E coli (EPEC) NOT DETECTED NOT DETECTED Final   Enterotoxigenic E coli (ETEC) NOT DETECTED NOT DETECTED Final   Shiga like toxin producing E coli (STEC) NOT DETECTED NOT DETECTED Final   Shigella/Enteroinvasive E coli (EIEC) NOT DETECTED NOT DETECTED Final   Cryptosporidium NOT DETECTED NOT DETECTED Final   Cyclospora cayetanensis NOT DETECTED NOT DETECTED Final   Entamoeba histolytica NOT DETECTED NOT DETECTED Final   Giardia lamblia NOT DETECTED NOT DETECTED Final   Adenovirus F40/41 NOT DETECTED NOT DETECTED Final   Astrovirus NOT DETECTED NOT DETECTED Final   Norovirus GI/GII NOT DETECTED NOT DETECTED Final   Rotavirus A NOT DETECTED NOT DETECTED Final   Sapovirus (I, II, IV, and V) NOT DETECTED NOT DETECTED Final    Comment: Performed at [facility], [address] [place], [place] [address]  Blood culture (routine x 2)     Status: None (Preliminary result)   Collection Time: 06/06/24  6:06 PM   Specimen: BLOOD  Result Value Ref Range Status  Specimen Description   Final    BLOOD SITE NOT SPECIFIED Performed at  [facility], [address] [place], [place] [address]    Special Requests   Final    BOTTLES DRAWN AEROBIC ONLY Blood Culture results may not be optimal due to an inadequate volume of blood received in culture bottles Performed at [facility], [address] [place], [place] [address]    Culture   Final    NO GROWTH 3 DAYS Performed at [facility], [address] [place], [place] [address]    Report Status PENDING  Incomplete     Studies: DG Abd 2 Views Result Date: 06/08/2024 CLINICAL DATA:  Abdominal pain and distension 2 weeks.  Diarrhea. EXAM: ABDOMEN - 2 VIEW COMPARISON:  06/17/2019 FINDINGS: Bowel gas pattern is nonobstructive. Mild fecal retention over the rectum. No free peritoneal air. Stable right base opacification likely effusion/atelectasis. Moderate degenerative changes of the curvature of the thoracolumbar spine convex right unchanged. Mild symmetric osteoarthritic change of the hips. Diffuse decreased bone mineralization. IMPRESSION: 1. No acute findings.  Mild fecal retention over the rectum. 2. Stable right base opacification likely effusion/atelectasis. Electronically Signed   By: Roda Cirri M.D.   On: 06/08/2024 13:29      Rosena Conradi, MD  [facility] 06/09/2024  If 7PM-7AM, please contact night-coverage

## 2024-06-09 NOTE — Progress Notes (Signed)
 Subjective: Having bowel movements; abdominal discomfort and distention improving.  Objective: Vital signs in last 24 hours: Temp:  [97.7 F (36.5 C)-98.5 F (36.9 C)] 97.7 F (36.5 C) (06/17 0447) Pulse Rate:  [82-107] 107 (06/16 2006) Resp:  [15-16] 16 (06/17 0447) BP: (117-134)/(67-89) 134/89 (06/17 0447) SpO2:  [90 %-97 %] 92 % (06/17 1010) Weight change:  Last BM Date : 06/08/24  PE: GEN:  NAD ABD:  Soft, mild generalized distention/tenderness (improved cf. Yesterday)  Lab Results: CBC    Component Value Date/Time   WBC 10.8 (H) 06/09/2024 0620   RBC 3.80 (L) 06/09/2024 0620   HGB 11.5 (L) 06/09/2024 0620   HGB 11.3 (L) 04/23/2024 1306   HGB 13.1 12/10/2017 1000   HCT 34.0 (L) 06/09/2024 0620   HCT 38.8 12/10/2017 1000   PLT 309 06/09/2024 0620   PLT 336 04/23/2024 1306   PLT 265 12/10/2017 1000   MCV 89.5 06/09/2024 0620   MCV 93.7 12/10/2017 1000   MCH 30.3 06/09/2024 0620   MCHC 33.8 06/09/2024 0620   RDW 17.2 (H) 06/09/2024 0620   RDW 13.9 12/10/2017 1000   LYMPHSABS 0.5 (L) 06/06/2024 0817   LYMPHSABS 0.9 12/10/2017 1000   MONOABS 0.8 06/06/2024 0817   MONOABS 0.6 12/10/2017 1000   EOSABS 0.0 06/06/2024 0817   EOSABS 0.4 12/10/2017 1000   BASOSABS 0.0 06/06/2024 0817   BASOSABS 0.0 12/10/2017 1000  CMP     Component Value Date/Time   NA 135 06/09/2024 0620   NA 142 12/10/2017 1000   K 3.8 06/09/2024 0620   K 4.0 12/10/2017 1000   CL 100 06/09/2024 0620   CL 108 (H) 06/03/2013 0939   CO2 25 06/09/2024 0620   CO2 28 12/10/2017 1000   GLUCOSE 114 (H) 06/09/2024 0620   GLUCOSE 86 12/10/2017 1000   GLUCOSE 85 06/03/2013 0939   BUN 29 (H) 06/09/2024 0620   BUN 20.4 12/10/2017 1000   CREATININE 0.94 06/09/2024 0620   CREATININE 0.88 04/23/2024 1306   CREATININE 0.9 12/10/2017 1000   CALCIUM  9.1 06/09/2024 0620   CALCIUM  9.7 12/10/2017 1000   PROT 5.8 (L) 06/07/2024 0639   PROT 6.4 12/10/2017 1000   ALBUMIN 2.8 (L) 06/07/2024 0639   ALBUMIN  3.6 12/10/2017 1000   AST 66 (H) 06/07/2024 0639   AST 29 04/23/2024 1306   AST 25 12/10/2017 1000   ALT 40 06/07/2024 0639   ALT 26 04/23/2024 1306   ALT 17 12/10/2017 1000   ALKPHOS 67 06/07/2024 0639   ALKPHOS 63 12/10/2017 1000   BILITOT 1.2 06/07/2024 0639   BILITOT 0.5 04/23/2024 1306   BILITOT 1.05 12/10/2017 1000   GFR 65.80 07/05/2016 1012   EGFR >60 12/10/2017 1000   GFRNONAA 59 (L) 06/09/2024 0620   GFRNONAA >60 04/23/2024 1306    Assessment:   Diarrhea, improving.  Suspect overflow diarrhea. Elevated LFTs, mild.  Plan:   Bowel regimen:  OOBTC and ambulate as tolerated; Miralax  17 grams po bid; liberal water  intake; fiber supplementation (e.g., Benefiber one tbsp po bid). [facility] will sign-off; patient can follow-up with me upon hospital discharge; thank you for the consultation.   Dana [name] 06/09/2024, 10:48 AM   Cell [phone] If no answer or after 5 PM call [phone]

## 2024-06-10 DIAGNOSIS — K521 Toxic gastroenteritis and colitis: Secondary | ICD-10-CM | POA: Diagnosis not present

## 2024-06-10 LAB — CBC
HCT: 34.7 % — ABNORMAL LOW (ref 36.0–46.0)
Hemoglobin: 11.3 g/dL — ABNORMAL LOW (ref 12.0–15.0)
MCH: 29.7 pg (ref 26.0–34.0)
MCHC: 32.6 g/dL (ref 30.0–36.0)
MCV: 91.1 fL (ref 80.0–100.0)
Platelets: 309 10*3/uL (ref 150–400)
RBC: 3.81 MIL/uL — ABNORMAL LOW (ref 3.87–5.11)
RDW: 16.9 % — ABNORMAL HIGH (ref 11.5–15.5)
WBC: 11 10*3/uL — ABNORMAL HIGH (ref 4.0–10.5)
nRBC: 0 % (ref 0.0–0.2)

## 2024-06-10 LAB — COMPREHENSIVE METABOLIC PANEL WITH GFR
ALT: 46 U/L — ABNORMAL HIGH (ref 0–44)
AST: 45 U/L — ABNORMAL HIGH (ref 15–41)
Albumin: 2.6 g/dL — ABNORMAL LOW (ref 3.5–5.0)
Alkaline Phosphatase: 65 U/L (ref 38–126)
Anion gap: 10 (ref 5–15)
BUN: 31 mg/dL — ABNORMAL HIGH (ref 8–23)
CO2: 24 mmol/L (ref 22–32)
Calcium: 9.2 mg/dL (ref 8.9–10.3)
Chloride: 101 mmol/L (ref 98–111)
Creatinine, Ser: 0.83 mg/dL (ref 0.44–1.00)
GFR, Estimated: >60 mL/min (ref >60)
Glucose, Bld: 102 mg/dL — ABNORMAL HIGH (ref 70–99)
Potassium: 3.6 mmol/L (ref 3.5–5.1)
Sodium: 135 mmol/L (ref 135–145)
Total Bilirubin: 0.8 mg/dL (ref 0.0–1.2)
Total Protein: 5.4 g/dL — ABNORMAL LOW (ref 6.5–8.1)

## 2024-06-10 LAB — MAGNESIUM: Magnesium: 1.4 mg/dL — ABNORMAL LOW (ref 1.7–2.4)

## 2024-06-10 MED ORDER — HALOPERIDOL LACTATE 5 MG/ML IJ SOLN
2.0000 mg | Freq: Four times a day (QID) | INTRAMUSCULAR | Status: DC | PRN
  Administered 2024-06-10: 2 mg via INTRAVENOUS
  Filled 2024-06-10: qty 1

## 2024-06-10 MED ORDER — MAGNESIUM SULFATE 4 GM/100ML IV SOLN
4.0000 g | Freq: Once | INTRAVENOUS | Status: AC
  Administered 2024-06-10: 4 g via INTRAVENOUS
  Filled 2024-06-10: qty 100

## 2024-06-10 NOTE — Plan of Care (Signed)
 Pt confused and agitated. Pulling off telemetry box and swinging at staff.  Telemetry placed on stand by as pt is refusing to keep on.  Problem: Elimination: Goal: Will not experience complications related to bowel motility Outcome: Progressing   Problem: Pain Managment: Goal: General experience of comfort will improve and/or be controlled Outcome: Progressing   Problem: Safety: Goal: Ability to remain free from injury will improve Outcome: Progressing   Problem: Skin Integrity: Goal: Risk for impaired skin integrity will decrease Outcome: Progressing

## 2024-06-10 NOTE — Progress Notes (Signed)
 PROGRESS NOTE  Dana [name] WNU:[id number] DOB: [birth year] DOA: 06/06/2024 PCP: [name], MD   LOS: 4 days   Brief narrative:  Dana [name] is a 89 y.o. female with past medical history significant of anemia, osteoarthritis, CAP, diastolic CHF, COPD, interstitial lung disease depression, GERD, nephrolithiasis, lung cancer, pulmonary embolism, hypertension, osteoporosis, Sjogren syndrome, history of C. difficile x 2 presented to hospital with near fall in the bathroom injuring her right forearm and right pretibial area.  Patient had multiple episodes of diarrhea after starting antibiotic following  resection of bladder tumor 4 days back.  In the ED, vitals were stable.  Labs showed CBC of 10.5 hemoglobin 10.7.Urinalysis with moderate hemoglobin, trace leukocyte esterase and a few bacteria.  C. difficile antigen and toxin were negative.   Coronavirus, influenza and RSV PCR were negative.  Lactic acid was normal.  Lipase was 298 units/L.  Magnesium  1.3 mg/dL.  LFT with AST 812 and ALT 46 units/L. Portable chest radiograph with no acute cardiopulmonary abnormality.  Similar appearance of the right lung status post treated for right pulmonary malignancy.  Ultrasound of the abdomen Limited visualization of the gallbladder.  No gallstone identified.  Mild dilatation of the CBD measuring 9 mm.  MRCP suggested.  Patient was then admitted to the hospital for further evaluation and treatment.     Assessment/Plan: Principal Problem:   Diarrhea due to drug Active Problems:   Cancer of right lung parenchyma (HCC)   Depression   HTN (hypertension)   GERD clinical dx only    Choledocholithiasis   Hypomagnesemia   Rheumatoid arthritis (HCC)   Sicca (HCC)   Sjogren syndrome, unspecified (HCC)   Mild protein malnutrition (HCC)   Heart failure with preserved left ventricular function (HFpEF) (HCC)   Transitional cell bladder cancer (HCC)   Acute agitated delirium.  Likely hospital induced.  Had  urinary retention as well.  Has been agitated and confused today.  Will continue to address risk factors.  In-N-Out catheterization was done.  Continue delirium precautions.  Spoke with the family member at bedside.  Will prescribe as needed Haldol if necessary.  Diarrhea likely leakage from rectal impaction  During hospitalization, patient received IV fluids.  C. difficile toxin was negative.  GI pathogen panel was negative.  Was initially on Rocephin  and Flagyl.  GI was patient during hospitalization.  Abdominal x-ray with possible rectal impaction.  Likely overflow diarrhea.  Enema was given and had good bowel movement.  Will need good bowel regimen on discharge.  Blood cultures negative in 4 days.  Elevated LFTs with dilated CBD on ultrasound.  GI recommended MRCP.  MRCP showed mild intra and extrahepatic biliary dilatation CBD up to 3.7 cm without calculus.  This is unchanged compared to previous MRI.    LFTs are improving.  GI has signed off at this time.   Transitional cell bladder cancer Acute urinary urinary retention today. Required In-N-Out catheter with removal of significant amount of urine.  Will BladderScan to see if.  Had flexible cystoscopy with urology 5 days ago.  Plan for outpatient follow-up with urology as outpatient.  Biopsy was good as per the patient's family.  Had BCG in the past.  Recently seen by urology and no evidence of hydronephrosis.     Cancer of right lung parenchyma  Follow-up with oncology as outpatient.  Has been on Tarceva  for 15 years.  Controlled as per the family.     Depression Continue mirtazapine      HTN (hypertension)  Antihypertensives on hold at this time.  Blood pressure is borderline low.  Will continue to monitor.      Hypomagnesemia Received IV magnesium  sulfate on presentation.  Will continue to replenish IV magnesium  sulfate 4 g for for magnesium  of 1.4 today.  Check magnesium  level in AM.    Rheumatoid arthritis with  Sicca    Sjogren  syndrome, unspecified  Continue Systane drops.  Follow-up with rheumatology as outpatient.    Heart failure with preserved left ventricular function (HFpEF)  Currently compensated.  Debility deconditioning, was seen by PT OT and recommended skilled nursing facility placement. TOC has been consulted. .  DVT prophylaxis: enoxaparin  (LOVENOX ) injection 30 mg Start: 06/06/24 2200   Disposition: Skilled nursing facility as per PT evaluation when stable.  Status is: Inpatient Remains inpatient appropriate because: Need for  rehabilitation and placement, new onset delirium,    Code Status:     Code Status: Do not attempt resuscitation (DNR) PRE-ARREST INTERVENTIONS DESIRED  Family Communication: I spoke with the patient's daughter in law on at bedside on 06/10/2024 and updated her about the clinical condition of the patient..  Consultants: None  Procedures: None  Anti-infectives:  Rocephin  and Flagyl IV-completed  Anti-infectives (From admission, onward)    Start     Dose/Rate Route Frequency Ordered Stop   06/07/24 1700  cefTRIAXone  (ROCEPHIN ) 1 g in sodium chloride  0.9 % 100 mL IVPB  Status:  Discontinued        1 g 200 mL/hr over 30 Minutes Intravenous Every 24 hours 06/06/24 1603 06/10/24 0735   06/06/24 1600  metroNIDAZOLE (FLAGYL) IVPB 500 mg  Status:  Discontinued        500 mg 100 mL/hr over 60 Minutes Intravenous Every 12 hours 06/06/24 1535 06/10/24 0735   06/06/24 1445  cefTRIAXone  (ROCEPHIN ) 1 g in sodium chloride  0.9 % 100 mL IVPB        1 g 200 mL/hr over 30 Minutes Intravenous  Once 06/06/24 1433 06/06/24 1803       Subjective:  Today, patient was seen and examined at bedside.  Patient was reported to be agitated confused pulling on lines today.  Bladder scan reported as significant urinary retention.  Patient's daughter-in-law at bedside.  Objective: Vitals:   06/10/24 0516 06/10/24 1343  BP: (!) 143/75 (!) 106/57  Pulse:  (!) 107  Resp: 18 16  Temp:  98.2 F (36.8 C) (!) 97.4 F (36.3 C)  SpO2: 96% 94%    Intake/Output Summary (Last 24 hours) at 06/10/2024 1703 Last data filed at 06/10/2024 1500 Gross per 24 hour  Intake 330 ml  Output 1300 ml  Net -970 ml   Filed Weights   06/06/24 0729  Weight: 45.4 kg   Body mass index is 18.29 kg/m.   Physical Exam:  General:  Average built, not in obvious distress, thinly built, Communicative, deconditioned, appears to be agitated and confused at times.  Slightly restless. HENT:   No scleral pallor or icterus noted. Oral mucosa is moist.  Chest:  Clear breath sounds.  Diminished breath sounds bilaterally CVS: S1 &S2 heard. No murmur.  Regular rate and rhythm. Abdomen: Soft, nontender, distended abdomen.  Bowel sounds are heard.   Extremities: No cyanosis, clubbing or edema.  Peripheral pulses are palpable. Psych: Alert, awake and Communicative but restless and agitated at times. CNS:  No cranial nerve deficits.  Generalized weakness noted.  Moves all extremities Skin: Warm and dry.  No rashes noted.  Data Review: I have  personally reviewed the following laboratory data and studies,  CBC: Recent Labs  Lab 06/06/24 0817 06/07/24 0639 06/08/24 0521 06/09/24 0620 06/10/24 0500  WBC 10.5 18.8* 14.1* 10.8* 11.0*  NEUTROABS 9.1*  --   --   --   --   HGB 10.7* 12.6 11.5* 11.5* 11.3*  HCT 31.7* 38.8 35.5* 34.0* 34.7*  MCV 89.0 92.4 92.7 89.5 91.1  PLT 286 292 257 309 309   Basic Metabolic Panel: Recent Labs  Lab 06/06/24 1200 06/06/24 1806 06/07/24 0639 06/08/24 0521 06/09/24 0620 06/10/24 0500  NA 136  --  137 136 135 135  K 3.7  --  3.6 4.4 3.8 3.6  CL 101  --  102 106 100 101  CO2 26  --  24 22 25 24   GLUCOSE 98  --  82 94 114* 102*  BUN 29*  --  20 27* 29* 31*  CREATININE 0.69  --  0.69 0.90 0.94 0.83  CALCIUM  9.6  --  9.4 8.9 9.1 9.2  MG 1.3*  --   --  1.5*  --  1.4*  PHOS  --  2.5  --   --   --   --    Liver Function Tests: Recent Labs  Lab 06/06/24 1200  06/07/24 0639 06/10/24 0500  AST 112* 66* 45*  ALT 46* 40 46*  ALKPHOS 56 67 65  BILITOT 1.3* 1.2 0.8  PROT 5.5* 5.8* 5.4*  ALBUMIN 3.0* 2.8* 2.6*   Recent Labs  Lab 06/06/24 1200  LIPASE 298*   No results for input(s): AMMONIA in the last 168 hours. Cardiac Enzymes: No results for input(s): CKTOTAL, CKMB, CKMBINDEX, TROPONINI in the last 168 hours. BNP (last 3 results) Recent Labs    11/24/23 1040  BNP 163.6*    ProBNP (last 3 results) No results for input(s): PROBNP in the last 8760 hours.  CBG: No results for input(s): GLUCAP in the last 168 hours. Recent Results (from the past 240 hours)  Resp panel by RT-PCR (RSV, Flu A&B, Covid) Anterior Nasal Swab     Status: None   Collection Time: 06/06/24  8:17 AM   Specimen: Anterior Nasal Swab  Result Value Ref Range Status   SARS Coronavirus 2 by RT PCR NEGATIVE NEGATIVE Final    Comment: (NOTE) SARS-CoV-2 target nucleic acids are NOT DETECTED.  The SARS-CoV-2 RNA is generally detectable in upper respiratory specimens during the acute phase of infection. The lowest concentration of SARS-CoV-2 viral copies this assay can detect is 138 copies/mL. A negative result does not preclude SARS-Cov-2 infection and should not be used as the sole basis for treatment or other patient management decisions. A negative result may occur with  improper specimen collection/handling, submission of specimen other than nasopharyngeal swab, presence of viral mutation(s) within the areas targeted by this assay, and inadequate number of viral copies(<138 copies/mL). A negative result must be combined with clinical observations, patient history, and epidemiological information. The expected result is Negative.  Fact Sheet for Patients:  BloggerCourse.com  Fact Sheet for Healthcare Providers:  SeriousBroker.it  This test is no t yet approved or cleared by the [place]  FDA  and  has been authorized for detection and/or diagnosis of SARS-CoV-2 by FDA under an Emergency Use Authorization (EUA). This EUA will remain  in effect (meaning this test can be used) for the duration of the COVID-19 declaration under Section 564(b)(1) of the Act, 21 U.S.C.section 360bbb-3(b)(1), unless the authorization is terminated  or revoked sooner.  Influenza A by PCR NEGATIVE NEGATIVE Final   Influenza B by PCR NEGATIVE NEGATIVE Final    Comment: (NOTE) The Xpert Xpress SARS-CoV-2/FLU/RSV plus assay is intended as an aid in the diagnosis of influenza from Nasopharyngeal swab specimens and should not be used as a sole basis for treatment. Nasal washings and aspirates are unacceptable for Xpert Xpress SARS-CoV-2/FLU/RSV testing.  Fact Sheet for Patients: BloggerCourse.com  Fact Sheet for Healthcare Providers: SeriousBroker.it  This test is not yet approved or cleared by the [place]  FDA and has been authorized for detection and/or diagnosis of SARS-CoV-2 by FDA under an Emergency Use Authorization (EUA). This EUA will remain in effect (meaning this test can be used) for the duration of the COVID-19 declaration under Section 564(b)(1) of the Act, 21 U.S.C. section 360bbb-3(b)(1), unless the authorization is terminated or revoked.     Resp Syncytial Virus by PCR NEGATIVE NEGATIVE Final    Comment: (NOTE) Fact Sheet for Patients: BloggerCourse.com  Fact Sheet for Healthcare Providers: SeriousBroker.it  This test is not yet approved or cleared by the [place]  FDA and has been authorized for detection and/or diagnosis of SARS-CoV-2 by FDA under an Emergency Use Authorization (EUA). This EUA will remain in effect (meaning this test can be used) for the duration of the COVID-19 declaration under Section 564(b)(1) of the Act, 21 U.S.C. section 360bbb-3(b)(1),  unless the authorization is terminated or revoked.  Performed at [facility], [address], [place], [place] [address]   Blood culture (routine x 2)     Status: None (Preliminary result)   Collection Time: 06/06/24  1:07 PM   Specimen: BLOOD LEFT HAND  Result Value Ref Range Status   Specimen Description   Final    BLOOD LEFT HAND Performed at [facility], [address] [place], [place] [address]    Special Requests   Final    BOTTLES DRAWN AEROBIC ONLY Blood Culture results may not be optimal due to an inadequate volume of blood received in culture bottles Performed at [facility], [address] [place], [place] [address]    Culture   Final    NO GROWTH 4 DAYS Performed at [facility], [address] [place], [place] [address]    Report Status PENDING  Incomplete  C Difficile Quick Screen w PCR reflex     Status: None   Collection Time: 06/06/24  1:56 PM   Specimen: Stool  Result Value Ref Range Status   C Diff antigen NEGATIVE NEGATIVE Final   C Diff toxin NEGATIVE NEGATIVE Final   C Diff interpretation No C. difficile detected.  Final    Comment: Performed at [facility], [address] [place], [place] [address]  Gastrointestinal Panel by PCR , Stool     Status: None   Collection Time: 06/06/24  1:57 PM   Specimen: Stool  Result Value Ref Range Status   Campylobacter species NOT DETECTED NOT DETECTED Final   Plesimonas shigelloides NOT DETECTED NOT DETECTED Final   Salmonella species NOT DETECTED NOT DETECTED Final   Yersinia enterocolitica NOT DETECTED NOT DETECTED Final   Vibrio species NOT DETECTED NOT DETECTED Final   Vibrio cholerae NOT DETECTED NOT DETECTED Final   Enteroaggregative E coli (EAEC) NOT DETECTED NOT DETECTED Final   Enteropathogenic E coli (EPEC) NOT DETECTED NOT DETECTED Final   Enterotoxigenic E coli (ETEC) NOT DETECTED NOT DETECTED Final   Shiga like toxin producing  E  coli (STEC) NOT DETECTED NOT DETECTED Final   Shigella/Enteroinvasive E coli (EIEC) NOT DETECTED NOT DETECTED Final   Cryptosporidium NOT DETECTED NOT DETECTED Final   Cyclospora cayetanensis NOT DETECTED NOT DETECTED Final   Entamoeba histolytica NOT DETECTED NOT DETECTED Final   Giardia lamblia NOT DETECTED NOT DETECTED Final   Adenovirus F40/41 NOT DETECTED NOT DETECTED Final   Astrovirus NOT DETECTED NOT DETECTED Final   Norovirus GI/GII NOT DETECTED NOT DETECTED Final   Rotavirus A NOT DETECTED NOT DETECTED Final   Sapovirus (I, II, IV, and V) NOT DETECTED NOT DETECTED Final    Comment: Performed at [facility], [address] [place], [place] [address]  Blood culture (routine x 2)     Status: None (Preliminary result)   Collection Time: 06/06/24  6:06 PM   Specimen: BLOOD  Result Value Ref Range Status   Specimen Description   Final    BLOOD SITE NOT SPECIFIED Performed at [facility], [address], [place], [place] [address]    Special Requests   Final    BOTTLES DRAWN AEROBIC ONLY Blood Culture results may not be optimal due to an inadequate volume of blood received in culture bottles Performed at [facility], [address], [place], [place] [address]    Culture   Final    NO GROWTH 4 DAYS Performed at [facility], [address] [place], [place] [address]    Report Status PENDING  Incomplete     Studies: No results found.     Jaiyden Laur, MD  [facility] 06/10/2024  If 7PM-7AM, please contact night-coverage

## 2024-06-10 NOTE — Plan of Care (Signed)
  Problem: Safety: Goal: Ability to remain free from injury will improve Outcome: Progressing   Problem: Skin Integrity: Goal: Risk for impaired skin integrity will decrease Outcome: Progressing   Problem: Activity: Goal: Risk for activity intolerance will decrease Outcome: Not Progressing   Problem: Coping: Goal: Level of anxiety will decrease Outcome: Not Progressing

## 2024-06-10 NOTE — TOC Progression Note (Signed)
 Transition of Care [facility]) - Progression Note    Patient Details  Name: Dana [name] MRN: [id number] Date of Birth: [birth year]  Transition of Care [facility]) CM/SW Contact  Loreda Rodriguez, RN Phone Number:[phone]  06/10/2024, 1:56 PM  Clinical Narrative:    Patient has received bed offers. CM called daughter [name] to update on offers. Daughter to review and follow up with CM for choice.   Bed offers presented to daughter listed below:  Service Provider Request Status Services Address Phone Fax Patient Preferred  [facility] Preferred SNF  Accepted -- [address], [place] [address] [phone] --  [facility] Preferred SNF  Accepted -- [address] [place] [address] [phone] --  [facility] SNF  Accepted -- [address], [place] [address] [phone] --  [facility] Preferred SNF  Accepted -- [address], [place] [address] [phone] --  [facility]  Accepted -- [address] [place], [place] [address] [phone] --  [facility] SN[facility]  Accepted -- [address], [place] [address] [phone] --  [facility] Preferred SNF  Accepted -- [address] [place] [address] [phone] --         Expected Discharge Plan and Services         Expected Discharge Date: 06/09/24                                     Social Determinants of Health (SDOH) Interventions SDOH Screenings   Food Insecurity: No Food Insecurity (06/06/2024)  Housing: Low Risk  (06/06/2024)  Transportation Needs: No Transportation Needs (06/06/2024)  Utilities: Not At Risk (06/06/2024)  Social Connections: Unknown (06/07/2024)  Tobacco Use: Medium Risk (06/07/2024)    Readmission Risk Interventions    06/07/2024    9:53 AM 11/26/2023   11:08 AM   Readmission Risk Prevention Plan  Transportation Screening Complete Complete  PCP or Specialist Appt within 5-7 Days Complete   PCP or Specialist Appt within 3-5 Days  Complete  Home Care Screening Complete   Medication Review (RN CM) Complete   HRI or Home Care Consult  Complete  Social Work Consult for Recovery Care Planning/Counseling  Complete  Palliative Care Screening  Not Applicable  Medication Review Oceanographer)  Referral to Pharmacy

## 2024-06-10 NOTE — Progress Notes (Signed)
 Per provider, pt will require Qshift bladder scans.

## 2024-06-10 NOTE — Progress Notes (Signed)
 Subjective: Somnolent (haldol for agitation recently)  Objective: Vital signs in last 24 hours: Temp:  [97.4 F (36.3 C)-98.2 F (36.8 C)] 97.4 F (36.3 C) (06/18 1343) Pulse Rate:  [80-107] 107 (06/18 1343) Resp:  [16-18] 16 (06/18 1343) BP: (106-143)/(57-75) 106/57 (06/18 1343) SpO2:  [94 %-96 %] 94 % (06/18 1343) Weight change:  Last BM Date : 06/09/24  PE: GEN:  Awake, somnolent, not speaking coherently. ABD:  Soft, mild distention  Lab Results: CBC    Component Value Date/Time   WBC 11.0 (H) 06/10/2024 0500   RBC 3.81 (L) 06/10/2024 0500   HGB 11.3 (L) 06/10/2024 0500   HGB 11.3 (L) 04/23/2024 1306   HGB 13.1 12/10/2017 1000   HCT 34.7 (L) 06/10/2024 0500   HCT 38.8 12/10/2017 1000   PLT 309 06/10/2024 0500   PLT 336 04/23/2024 1306   PLT 265 12/10/2017 1000   MCV 91.1 06/10/2024 0500   MCV 93.7 12/10/2017 1000   MCH 29.7 06/10/2024 0500   MCHC 32.6 06/10/2024 0500   RDW 16.9 (H) 06/10/2024 0500   RDW 13.9 12/10/2017 1000   LYMPHSABS 0.5 (L) 06/06/2024 0817   LYMPHSABS 0.9 12/10/2017 1000   MONOABS 0.8 06/06/2024 0817   MONOABS 0.6 12/10/2017 1000   EOSABS 0.0 06/06/2024 0817   EOSABS 0.4 12/10/2017 1000   BASOSABS 0.0 06/06/2024 0817   BASOSABS 0.0 12/10/2017 1000  CMP     Component Value Date/Time   NA 135 06/10/2024 0500   NA 142 12/10/2017 1000   K 3.6 06/10/2024 0500   K 4.0 12/10/2017 1000   CL 101 06/10/2024 0500   CL 108 (H) 06/03/2013 0939   CO2 24 06/10/2024 0500   CO2 28 12/10/2017 1000   GLUCOSE 102 (H) 06/10/2024 0500   GLUCOSE 86 12/10/2017 1000   GLUCOSE 85 06/03/2013 0939   BUN 31 (H) 06/10/2024 0500   BUN 20.4 12/10/2017 1000   CREATININE 0.83 06/10/2024 0500   CREATININE 0.88 04/23/2024 1306   CREATININE 0.9 12/10/2017 1000   CALCIUM  9.2 06/10/2024 0500   CALCIUM  9.7 12/10/2017 1000   PROT 5.4 (L) 06/10/2024 0500   PROT 6.4 12/10/2017 1000   ALBUMIN 2.6 (L) 06/10/2024 0500   ALBUMIN 3.6 12/10/2017 1000   AST 45 (H)  06/10/2024 0500   AST 29 04/23/2024 1306   AST 25 12/10/2017 1000   ALT 46 (H) 06/10/2024 0500   ALT 26 04/23/2024 1306   ALT 17 12/10/2017 1000   ALKPHOS 65 06/10/2024 0500   ALKPHOS 63 12/10/2017 1000   BILITOT 0.8 06/10/2024 0500   BILITOT 0.5 04/23/2024 1306   BILITOT 1.05 12/10/2017 1000   GFR 65.80 07/05/2016 1012   EGFR >60 12/10/2017 1000   GFRNONAA >60 06/10/2024 0500   GFRNONAA >60 04/23/2024 1306    Assessment:   Abdominal discomfort last night; > 1 L urine found on in/out bladder catheter. Suspected constipation-mediated overflow diarrhea.  Plan:   Miralax  17 grams po bid and Benefiber one tbsp po bid as outpatient. No further GI work-up anticipated. We will arrange outpatient GI follow-up with us . [facility] will sign-off; please call with questions; thank you for the consultation.   Yves Herb 06/10/2024, 1:44 PM   Cell [phone] If no answer or after 5 PM call [phone]

## 2024-06-10 NOTE — Progress Notes (Signed)
 Pt daughter is very concerned with pts mentation and agitation. Family stated on yesterday evening she tried to give pt phone numbers and pt attempted to write them down and having aphasia. Pt was writing the wrong numbers down. Pt thinks she's at home. Pulling off tele and agitated throughout the night. States this isn't her baseline  This nurse contacted provider about pts increase in confusion. This nurse asked if pt needed scan of brain. Provider stated pt didn't need scan, most likely hospital delirium. Delirium precautions initiated. Bladder scan order. Pt bed in the lowest position, wheels locked and bed alarm placed. Call light and house phone within reach. Rise and fall of chest observed and care continues.

## 2024-06-11 ENCOUNTER — Other Ambulatory Visit (HOSPITAL_COMMUNITY): Payer: Self-pay

## 2024-06-11 ENCOUNTER — Other Ambulatory Visit: Payer: Self-pay

## 2024-06-11 DIAGNOSIS — K521 Toxic gastroenteritis and colitis: Secondary | ICD-10-CM | POA: Diagnosis not present

## 2024-06-11 LAB — BASIC METABOLIC PANEL WITH GFR
Anion gap: 9 (ref 5–15)
BUN: 35 mg/dL — ABNORMAL HIGH (ref 8–23)
CO2: 27 mmol/L (ref 22–32)
Calcium: 9 mg/dL (ref 8.9–10.3)
Chloride: 101 mmol/L (ref 98–111)
Creatinine, Ser: 0.88 mg/dL (ref 0.44–1.00)
GFR, Estimated: >60 mL/min (ref >60)
Glucose, Bld: 92 mg/dL (ref 70–99)
Potassium: 3.9 mmol/L (ref 3.5–5.1)
Sodium: 137 mmol/L (ref 135–145)

## 2024-06-11 LAB — CBC
HCT: 29.8 % — ABNORMAL LOW (ref 36.0–46.0)
Hemoglobin: 9.8 g/dL — ABNORMAL LOW (ref 12.0–15.0)
MCH: 30.2 pg (ref 26.0–34.0)
MCHC: 32.9 g/dL (ref 30.0–36.0)
MCV: 92 fL (ref 80.0–100.0)
Platelets: 266 10*3/uL (ref 150–400)
RBC: 3.24 MIL/uL — ABNORMAL LOW (ref 3.87–5.11)
RDW: 16.9 % — ABNORMAL HIGH (ref 11.5–15.5)
WBC: 9.5 10*3/uL (ref 4.0–10.5)
nRBC: 0 % (ref 0.0–0.2)

## 2024-06-11 LAB — CULTURE, BLOOD (ROUTINE X 2)
Culture: NO GROWTH
Culture: NO GROWTH

## 2024-06-11 LAB — MAGNESIUM: Magnesium: 2.1 mg/dL (ref 1.7–2.4)

## 2024-06-11 MED ORDER — ERLOTINIB HCL 100 MG PO TABS
100.0000 mg | ORAL_TABLET | Freq: Every day | ORAL | Status: DC
  Administered 2024-06-11: 100 mg via ORAL
  Filled 2024-06-11 (×3): qty 1

## 2024-06-11 MED ORDER — CHLORHEXIDINE GLUCONATE CLOTH 2 % EX PADS
6.0000 | MEDICATED_PAD | Freq: Every day | CUTANEOUS | Status: DC
  Administered 2024-06-11 – 2024-06-12 (×2): 6 via TOPICAL

## 2024-06-11 MED ORDER — POLYETHYLENE GLYCOL 3350 17 G PO PACK
17.0000 g | PACK | Freq: Every day | ORAL | Status: DC
  Administered 2024-06-11 – 2024-06-12 (×2): 17 g via ORAL
  Filled 2024-06-11 (×2): qty 1

## 2024-06-11 MED ORDER — DOCUSATE SODIUM 100 MG PO CAPS
100.0000 mg | ORAL_CAPSULE | Freq: Two times a day (BID) | ORAL | Status: DC
  Administered 2024-06-11 – 2024-06-12 (×3): 100 mg via ORAL
  Filled 2024-06-11 (×3): qty 1

## 2024-06-11 NOTE — Progress Notes (Signed)
 ED contacted about pts missing knee brace. Per ED nurse, leadership to follow- up.

## 2024-06-11 NOTE — Plan of Care (Signed)

## 2024-06-11 NOTE — TOC Progression Note (Signed)
 Transition of Care [facility]) - Progression Note    Patient Details  Name: Dana [name] MRN: [id number] Date of Birth: [birth year]  Transition of [facility]) CM/SW Contact  Loreda Rodriguez, RN Phone Number:[phone]  06/11/2024, 3:17 PM  Clinical Narrative:    Daughter in law Marily Shows has confirmed family choice For [facility]. CM has spoken with liaison Darian to confirm that facility has ben available. Facility does have bed for patient. Insurance [employer] has been initiated [employer] ID# [id number]. TOC will follow for medical readiness.    Expected Discharge Plan: Skilled Nursing Facility Barriers to Discharge: Continued Medical Work up  Expected Discharge Plan and Services In-house Referral: NA Discharge Planning Services: CM Consult Post Acute Care Choice: Skilled Nursing Facility Living arrangements for the past 2 months: Single Family Home Expected Discharge Date: 06/09/24               DME Arranged: N/A DME Agency: NA       HH Arranged: NA           Social Determinants of Health (SDOH) Interventions SDOH Screenings   Food Insecurity: No Food Insecurity (06/06/2024)  Housing: Low Risk  (06/06/2024)  Transportation Needs: No Transportation Needs (06/06/2024)  Utilities: Not At Risk (06/06/2024)  Social Connections: Unknown (06/07/2024)  Tobacco Use: Medium Risk (06/07/2024)    Readmission Risk Interventions    06/07/2024    9:53 AM 11/26/2023   11:08 AM  Readmission Risk Prevention Plan  Transportation Screening Complete Complete  PCP or Specialist Appt within 5-7 Days Complete   PCP or Specialist Appt within 3-5 Days  Complete  Home Care Screening Complete   Medication Review (RN CM) Complete   HRI or Home Care Consult  Complete  Social Work Consult for Recovery Care Planning/Counseling  Complete  Palliative Care Screening  Not Applicable  Medication Review Oceanographer)  Referral to Pharmacy

## 2024-06-11 NOTE — Progress Notes (Addendum)
 PROGRESS NOTE  Dana [name] ZOX:[id number] DOB: Nov 18, 1935 DOA: 06/06/2024 PCP: [name], MD   LOS: 5 days   Brief narrative:  Dana [name] is a 89 y.o. female with past medical history significant of anemia, osteoarthritis, CAP, diastolic CHF, COPD, interstitial lung disease depression, GERD, nephrolithiasis, lung cancer, pulmonary embolism, hypertension, osteoporosis, Sjogren syndrome, history of C. difficile x 2 presented to hospital with near fall in the bathroom injuring her right forearm and right pretibial area.  Patient had multiple episodes of diarrhea after starting antibiotic following  resection of bladder tumor 4 days back.  In the ED, vitals were stable.  Labs showed CBC of 10.5 hemoglobin 10.7.Urinalysis with moderate hemoglobin, trace leukocyte esterase and a few bacteria.  C. difficile antigen and toxin were negative.   Coronavirus, influenza and RSV PCR were negative.  Lactic acid was normal.  Lipase was 298 units/L.  Magnesium  1.3 mg/dL.  LFT with AST 812 and ALT 46 units/L. Portable chest radiograph with no acute cardiopulmonary abnormality.  Similar appearance of the right lung status post treated for right pulmonary malignancy.  Ultrasound of the abdomen Limited visualization of the gallbladder.  No gallstone identified.  Mild dilatation of the CBD measuring 9 mm.  MRCP suggested.  Patient was then admitted to the hospital for further evaluation and treatment.     Assessment/Plan: Principal Problem:   Overflow diarrhea Active Problems:   Cancer of right lung parenchyma (HCC)   Depression   HTN (hypertension)   GERD clinical dx only    Choledocholithiasis   Hypomagnesemia   Rheumatoid arthritis (HCC)   Sicca (HCC)   Sjogren syndrome, unspecified (HCC)   Mild protein malnutrition (HCC)   Heart failure with preserved left ventricular function (HFpEF) (HCC)   Transitional cell bladder cancer (HCC)   Acute agitated delirium.  Improved.  Likely hospital induced  and secondary to urinary retention..   In-N-Out catheterization was done significant amount of urine was drained.  Patient has returned to baseline this morning..  Continue delirium precautions.  Overflow diarrhea from rectal fecal impaction During hospitalization, patient received IV fluids.  C. difficile toxin was negative.  GI pathogen panel was negative.  Was initially on Rocephin  and Flagyl.  GI was also consulted.  Abdominal x-ray with possible rectal impaction.  Likely overflow diarrhea.  Will need to ensure good bowel regimen on discharge despite impaction.    Elevated LFTs with dilated CBD on ultrasound.  GI recommended MRCP.  MRCP showed mild intra and extrahepatic biliary dilatation CBD up to 3.7 cm without calculus.  This is unchanged compared to previous MRI.    LFTs improved..  GI has signed off at this time.   Transitional cell bladder cancer Status post urinary retention. Required In-N-Out catheter with removal of significant amount of urine initially followed by second In-N-Out cath.  Communicated with Dr. Aimee Alf urology about it and he is okay with Foley catheter if needed with outpatient voiding trial in the clinic in 1 week.  Of note urology had flexible cystoscopy with urology 5 days ago.  Plan for outpatient follow-up with urology as outpatient.  Biopsy was good as per the patient's family.  Had BCG in the past.  Will continue MiraLAX   and stool softeners to ensure adequate bowel movements     Cancer of right lung parenchyma  Follow-up with oncology as outpatient.  Has been on Tarceva  for 15 years.  Controlled as per the family.  Will resume.     Depression Continue  mirtazapine      HTN (hypertension) Antihypertensives on hold at this time.  Blood pressure is better today.  If improving can reinitiate.      Hypomagnesemia Improved after supplementation.  Latest magnesium  of 2.1    Rheumatoid arthritis with  Sicca    Sjogren syndrome, unspecified  Continue  Systane drops.  Follow-up with rheumatology as outpatient.    Heart failure with preserved left ventricular function (HFpEF)  Currently compensated.  Will continue to monitor.  Debility deconditioning, was seen by PT OT and recommended skilled nursing facility placement. TOC has been consulted. .  DVT prophylaxis: enoxaparin  (LOVENOX ) injection 30 mg Start: 06/06/24 2200   Disposition: Skilled nursing facility.  Medically stable for disposition.  Status is: Inpatient Remains inpatient appropriate because: Need for  rehabilitation and placement, new onset delirium,    Code Status:     Code Status: Do not attempt resuscitation (DNR) PRE-ARREST INTERVENTIONS DESIRED  Family Communication:  I spoke with the patient's daughter- in law on at bedside on 06/10/2024   Consultants: None  Procedures: None  Anti-infectives:  None currently  Subjective:  Today, patient was seen and examined at bedside.  Patient appears to be more calm and cooperative today.  Had difficulty with urinary retention and needed second In-N-Out catheter.  Had a bowel movement yesterday as per the nursing staff.    Objective: Vitals:   06/11/24 0530 06/11/24 0931  BP: 128/68   Pulse: 93   Resp: 16   Temp: (!) 97.5 F (36.4 C)   SpO2: 97% 99%    Intake/Output Summary (Last 24 hours) at 06/11/2024 1306 Last data filed at 06/11/2024 0900 Gross per 24 hour  Intake 580 ml  Output 500 ml  Net 80 ml   Filed Weights   06/06/24 0729  Weight: 45.4 kg   Body mass index is 18.29 kg/m.   Physical Exam:  General:  Average built, not in obvious distress, thinly built, Communicative, deconditioned, calm cooperative and oriented.   HENT:   No scleral pallor or icterus noted. Oral mucosa is moist.  Chest:  Clear breath sounds.  Diminished breath sounds bilaterally CVS: S1 &S2 heard. No murmur.  Regular rate and rhythm. Abdomen: Soft, nontender, distended abdomen.  Bowel sounds are heard.   Extremities: No  cyanosis, clubbing or edema.   Psych: Alert, awake and Communicative, calm and cooperative today CNS:  No cranial nerve deficits.  Generalized weakness noted.  Moves all extremities Skin: Warm and dry.  No rashes noted.  Data Review: I have personally reviewed the following laboratory data and studies,  CBC: Recent Labs  Lab 06/06/24 0817 06/07/24 0639 06/08/24 0521 06/09/24 0620 06/10/24 0500 06/11/24 0550  WBC 10.5 18.8* 14.1* 10.8* 11.0* 9.5  NEUTROABS 9.1*  --   --   --   --   --   HGB 10.7* 12.6 11.5* 11.5* 11.3* 9.8*  HCT 31.7* 38.8 35.5* 34.0* 34.7* 29.8*  MCV 89.0 92.4 92.7 89.5 91.1 92.0  PLT 286 292 257 309 309 266   Basic Metabolic Panel: Recent Labs  Lab 06/06/24 1200 06/06/24 1806 06/07/24 0639 06/08/24 0521 06/09/24 0620 06/10/24 0500 06/11/24 0550  NA 136  --  137 136 135 135 137  K 3.7  --  3.6 4.4 3.8 3.6 3.9  CL 101  --  102 106 100 101 101  CO2 26  --  24 22 25 24 27   GLUCOSE 98  --  82 94 114* 102* 92  BUN 29*  --  20 27* 29* 31* 35*  CREATININE 0.69  --  0.69 0.90 0.94 0.83 0.88  CALCIUM  9.6  --  9.4 8.9 9.1 9.2 9.0  MG 1.3*  --   --  1.5*  --  1.4* 2.1  PHOS  --  2.5  --   --   --   --   --    Liver Function Tests: Recent Labs  Lab 06/06/24 1200 06/07/24 0639 06/10/24 0500  AST 112* 66* 45*  ALT 46* 40 46*  ALKPHOS 56 67 65  BILITOT 1.3* 1.2 0.8  PROT 5.5* 5.8* 5.4*  ALBUMIN 3.0* 2.8* 2.6*   Recent Labs  Lab 06/06/24 1200  LIPASE 298*   No results for input(s): AMMONIA in the last 168 hours. Cardiac Enzymes: No results for input(s): CKTOTAL, CKMB, CKMBINDEX, TROPONINI in the last 168 hours. BNP (last 3 results) Recent Labs    11/24/23 1040  BNP 163.6*    ProBNP (last 3 results) No results for input(s): PROBNP in the last 8760 hours.  CBG: No results for input(s): GLUCAP in the last 168 hours. Recent Results (from the past 240 hours)  Resp panel by RT-PCR (RSV, Flu A&B, Covid) Anterior Nasal Swab      Status: None   Collection Time: 06/06/24  8:17 AM   Specimen: Anterior Nasal Swab  Result Value Ref Range Status   SARS Coronavirus 2 by RT PCR NEGATIVE NEGATIVE Final    Comment: (NOTE) SARS-CoV-2 target nucleic acids are NOT DETECTED.  The SARS-CoV-2 RNA is generally detectable in upper respiratory specimens during the acute phase of infection. The lowest concentration of SARS-CoV-2 viral copies this assay can detect is 138 copies/mL. A negative result does not preclude SARS-Cov-2 infection and should not be used as the sole basis for treatment or other patient management decisions. A negative result may occur with  improper specimen collection/handling, submission of specimen other than nasopharyngeal swab, presence of viral mutation(s) within the areas targeted by this assay, and inadequate number of viral copies(<138 copies/mL). A negative result must be combined with clinical observations, patient history, and epidemiological information. The expected result is Negative.  Fact Sheet for Patients:  BloggerCourse.com  Fact Sheet for Healthcare Providers:  SeriousBroker.it  This test is no t yet approved or cleared by the [place]  FDA and  has been authorized for detection and/or diagnosis of SARS-CoV-2 by FDA under an Emergency Use Authorization (EUA). This EUA will remain  in effect (meaning this test can be used) for the duration of the COVID-19 declaration under Section 564(b)(1) of the Act, 21 U.S.C.section 360bbb-3(b)(1), unless the authorization is terminated  or revoked sooner.       Influenza A by PCR NEGATIVE NEGATIVE Final   Influenza B by PCR NEGATIVE NEGATIVE Final    Comment: (NOTE) The Xpert Xpress SARS-CoV-2/FLU/RSV plus assay is intended as an aid in the diagnosis of influenza from Nasopharyngeal swab specimens and should not be used as a sole basis for treatment. Nasal washings and aspirates are  unacceptable for Xpert Xpress SARS-CoV-2/FLU/RSV testing.  Fact Sheet for Patients: BloggerCourse.com  Fact Sheet for Healthcare Providers: SeriousBroker.it  This test is not yet approved or cleared by the [place]  FDA and has been authorized for detection and/or diagnosis of SARS-CoV-2 by FDA under an Emergency Use Authorization (EUA). This EUA will remain in effect (meaning this test can be used) for the duration of the COVID-19 declaration under Section 564(b)(1) of the Act, 21 U.S.C. section 360bbb-3(b)(1), unless the  authorization is terminated or revoked.     Resp Syncytial Virus by PCR NEGATIVE NEGATIVE Final    Comment: (NOTE) Fact Sheet for Patients: BloggerCourse.com  Fact Sheet for Healthcare Providers: SeriousBroker.it  This test is not yet approved or cleared by the [place]  FDA and has been authorized for detection and/or diagnosis of SARS-CoV-2 by FDA under an Emergency Use Authorization (EUA). This EUA will remain in effect (meaning this test can be used) for the duration of the COVID-19 declaration under Section 564(b)(1) of the Act, 21 U.S.C. section 360bbb-3(b)(1), unless the authorization is terminated or revoked.  Performed at [facility], [address] [place], [place] [address]   Blood culture (routine x 2)     Status: None   Collection Time: 06/06/24  1:07 PM   Specimen: BLOOD LEFT HAND  Result Value Ref Range Status   Specimen Description   Final    BLOOD LEFT HAND Performed at [facility], [address] [place], [place] [address]    Special Requests   Final    BOTTLES DRAWN AEROBIC ONLY Blood Culture results may not be optimal due to an inadequate volume of blood received in culture bottles Performed at [facility], [address], [place], [place] [address]    Culture   Final    NO  GROWTH 5 DAYS Performed at [facility], [address], [place], [place] [address]    Report Status 06/11/2024 FINAL  Final  C Difficile Quick Screen w PCR reflex     Status: None   Collection Time: 06/06/24  1:56 PM   Specimen: Stool  Result Value Ref Range Status   C Diff antigen NEGATIVE NEGATIVE Final   C Diff toxin NEGATIVE NEGATIVE Final   C Diff interpretation No C. difficile detected.  Final    Comment: Performed at [facility], [address] [place], [place] [address]  Gastrointestinal Panel by PCR , Stool     Status: None   Collection Time: 06/06/24  1:57 PM   Specimen: Stool  Result Value Ref Range Status   Campylobacter species NOT DETECTED NOT DETECTED Final   Plesimonas shigelloides NOT DETECTED NOT DETECTED Final   Salmonella species NOT DETECTED NOT DETECTED Final   Yersinia enterocolitica NOT DETECTED NOT DETECTED Final   Vibrio species NOT DETECTED NOT DETECTED Final   Vibrio cholerae NOT DETECTED NOT DETECTED Final   Enteroaggregative E coli (EAEC) NOT DETECTED NOT DETECTED Final   Enteropathogenic E coli (EPEC) NOT DETECTED NOT DETECTED Final   Enterotoxigenic E coli (ETEC) NOT DETECTED NOT DETECTED Final   Shiga like toxin producing E coli (STEC) NOT DETECTED NOT DETECTED Final   Shigella/Enteroinvasive E coli (EIEC) NOT DETECTED NOT DETECTED Final   Cryptosporidium NOT DETECTED NOT DETECTED Final   Cyclospora cayetanensis NOT DETECTED NOT DETECTED Final   Entamoeba histolytica NOT DETECTED NOT DETECTED Final   Giardia lamblia NOT DETECTED NOT DETECTED Final   Adenovirus F40/41 NOT DETECTED NOT DETECTED Final   Astrovirus NOT DETECTED NOT DETECTED Final   Norovirus GI/GII NOT DETECTED NOT DETECTED Final   Rotavirus A NOT DETECTED NOT DETECTED Final   Sapovirus (I, II, IV, and V) NOT DETECTED NOT DETECTED Final    Comment: Performed at [facility], [address] [place], [place] [address]  Blood culture (routine x 2)      Status: None   Collection Time: 06/06/24  6:06 PM   Specimen:  BLOOD  Result Value Ref Range Status   Specimen Description   Final    BLOOD SITE NOT SPECIFIED Performed at [facility], [address] [place], [place] [address]    Special Requests   Final    BOTTLES DRAWN AEROBIC ONLY Blood Culture results may not be optimal due to an inadequate volume of blood received in culture bottles Performed at [facility], [address] [place], [place] [address]    Culture   Final    NO GROWTH 5 DAYS Performed at [facility], [address] [place], [place] [address]    Report Status 06/11/2024 FINAL  Final     Studies: No results found.     Oshea Percival, MD  [facility] 06/11/2024  If 7PM-7AM, please contact night-coverage

## 2024-06-11 NOTE — Progress Notes (Addendum)
 Physical Therapy Treatment Patient Details Name: Dana [name] MRN: [id number] DOB: Dec 16, 1935 Today's Date: 06/11/2024   History of Present Illness Patient is a 89 year old female who was admitted with drug induced diarrhea. QIO:NGEXBM, osteoarthritis, CAP, diastolic CHF, COPD, interstitial lung disease depression, GERD, nephrolithiasis, lung cancer, pulmonary embolism, hypertension, osteoporosis, Sjogren syndrome, history of C. difficile x 2    PT Comments  Pt reports she's not been able to urinate, she c/o significant pressure in her bladder, RN notified. Pt stated she's walked to the bathroom a couple times today but could not urinate. She stated she's not able to tolerate mobility at present. Pt performed a few bed level exercises, she declined exercise involving R knee 2* chronic pain. Attempted to place bed into chair position but pt c/o feeling lightheaded with HOB up, so lowered HOB.     If plan is discharge home, recommend the following: A little help with walking and/or transfers;A lot of help with bathing/dressing/bathroom;Assistance with cooking/housework;Assist for transportation;Help with stairs or ramp for entrance   Can travel by private vehicle     Yes  Equipment Recommendations  None recommended by PT    Recommendations for Other Services       Precautions / Restrictions Precautions Precautions: Fall Recall of Precautions/Restrictions: Intact Restrictions Weight Bearing Restrictions Per Provider Order: No     Mobility  Bed Mobility               General bed mobility comments: deferred 2* bladder pain    Transfers                        Ambulation/Gait                   Stairs             Wheelchair Mobility     Tilt Bed    Modified Rankin (Stroke Patients Only)       Balance                                            Communication Communication Communication: No apparent difficulties   Cognition Arousal: Alert Behavior During Therapy: WFL for tasks assessed/performed   PT - Cognitive impairments: No apparent impairments                         Following commands: Intact      Cueing Cueing Techniques: Verbal cues  Exercises General Exercises - Upper Extremity Shoulder Flexion: AROM, Left, 10 reps, Supine General Exercises - Lower Extremity Ankle Circles/Pumps: AROM, Both, 10 reps, Supine Heel Slides: Left, 10 reps, Supine, AROM    General Comments        Pertinent Vitals/Pain Pain Assessment Pain Assessment: Faces Faces Pain Scale: Hurts even more Pain Location: bladder Pain Descriptors / Indicators: Discomfort Pain Intervention(s): Limited activity within patient's tolerance, Monitored during session, Patient requesting pain meds-RN notified    Home Living                          Prior Function            PT Goals (current goals can now be found in the care plan section) Acute Rehab PT Goals Patient Stated Goal: to be able to empty her bladder PT  Goal Formulation: With patient Time For Goal Achievement: 06/23/24 Potential to Achieve Goals: Good Progress towards PT goals: Not progressing toward goals - comment    Frequency    Min 3X/week      PT Plan      Co-evaluation              AM-PAC PT 6 Clicks Mobility   Outcome Measure  Help needed turning from your back to your side while in a flat bed without using bedrails?: A Lot Help needed moving from lying on your back to sitting on the side of a flat bed without using bedrails?: A Little Help needed moving to and from a bed to a chair (including a wheelchair)?: A Little Help needed standing up from a chair using your arms (e.g., wheelchair or bedside chair)?: A Little Help needed to walk in hospital room?: A Little Help needed climbing 3-5 steps with a railing? : Total 6 Click Score: 15    End of Session Equipment Utilized During Treatment: Gait  belt Activity Tolerance: Patient limited by pain;Patient limited by fatigue Patient left: in bed;with call bell/phone within reach;with bed alarm set Nurse Communication: Mobility status;Other (comment);Patient requests pain meds (pt requesting I&O catheterization) PT Visit Diagnosis: Unsteadiness on feet (R26.81);Other abnormalities of gait and mobility (R26.89);Muscle weakness (generalized) (M62.81);Difficulty in walking, not elsewhere classified (R26.2)     Time: 1610-9604 PT Time Calculation (min) (ACUTE ONLY): 17 min  Charges:    $Therapeutic Exercise: 8-22 mins PT General Charges $$ ACUTE PT VISIT: 1 Visit                     Daymon Evans PT 06/11/2024  Acute Rehabilitation Services  Office [phone]

## 2024-06-11 NOTE — Progress Notes (Signed)
 Clinical Intervention Note  Clinical Intervention Notes: Patient daughter reported she has been in hospital and staff misplaced her medication so she has missed 6 doses. She subsequently called back and stated that they found it so patient has restarted medication.   Clinical Intervention Outcomes: Improved therapy adherence   Rena Carnes Specialty Pharmacist

## 2024-06-11 NOTE — Progress Notes (Signed)
 Pt missing knee brace. This nurse searched the room, drawers, and bathroom and unable to find brace. Per pt knee brace was soiled with stool and thrown away.

## 2024-06-11 NOTE — Progress Notes (Signed)
 Patient daughter called back medication was located by hospital staff, does not need a refill at this time. Request a call back by 7/3 for possible refill. Daughter will call back sooner if refill is need at an earlier date.

## 2024-06-11 NOTE — Progress Notes (Signed)
 This nurse was contacted by pts daughter regarding home chemo medication. Per family pt is suppose to take this medication daily. PER MAR, pt hasn't taken this medication since admission.  This nurse looked into pts chart and saw that pt has a stored medication formed. This nurse contacted inpatient to verify if medication is stored down there. Per pharmacy technician, pt medication is stored in pharmacy.

## 2024-06-11 NOTE — Progress Notes (Signed)
 Specialty Pharmacy Refill Coordination Note  Dana [name] is a 89 y.o. female, patients daughter was contacted today regarding refills of specialty medication(s) Erlotinib  HCl (TARCEVA )   Patient requested [facility] at [facility] [place] date: 06/11/24   Medication will be filled on 06/11/24.   Daughter may call back to switch to delivery.

## 2024-06-12 ENCOUNTER — Encounter: Admitting: Physical Medicine & Rehabilitation

## 2024-06-12 DIAGNOSIS — N139 Obstructive and reflux uropathy, unspecified: Secondary | ICD-10-CM | POA: Diagnosis not present

## 2024-06-12 DIAGNOSIS — Z743 Need for continuous supervision: Secondary | ICD-10-CM | POA: Diagnosis not present

## 2024-06-12 DIAGNOSIS — C679 Malignant neoplasm of bladder, unspecified: Secondary | ICD-10-CM

## 2024-06-12 DIAGNOSIS — C3491 Malignant neoplasm of unspecified part of right bronchus or lung: Secondary | ICD-10-CM

## 2024-06-12 DIAGNOSIS — C3490 Malignant neoplasm of unspecified part of unspecified bronchus or lung: Secondary | ICD-10-CM | POA: Diagnosis not present

## 2024-06-12 DIAGNOSIS — R7989 Other specified abnormal findings of blood chemistry: Secondary | ICD-10-CM | POA: Diagnosis not present

## 2024-06-12 DIAGNOSIS — R197 Diarrhea, unspecified: Secondary | ICD-10-CM

## 2024-06-12 DIAGNOSIS — R1915 Other abnormal bowel sounds: Secondary | ICD-10-CM | POA: Diagnosis not present

## 2024-06-12 DIAGNOSIS — E441 Mild protein-calorie malnutrition: Secondary | ICD-10-CM

## 2024-06-12 DIAGNOSIS — R339 Retention of urine, unspecified: Secondary | ICD-10-CM | POA: Diagnosis not present

## 2024-06-12 DIAGNOSIS — K838 Other specified diseases of biliary tract: Secondary | ICD-10-CM | POA: Diagnosis not present

## 2024-06-12 DIAGNOSIS — I509 Heart failure, unspecified: Secondary | ICD-10-CM | POA: Diagnosis not present

## 2024-06-12 DIAGNOSIS — K521 Toxic gastroenteritis and colitis: Secondary | ICD-10-CM | POA: Diagnosis not present

## 2024-06-12 DIAGNOSIS — M069 Rheumatoid arthritis, unspecified: Secondary | ICD-10-CM | POA: Diagnosis not present

## 2024-06-12 DIAGNOSIS — I10 Essential (primary) hypertension: Secondary | ICD-10-CM | POA: Diagnosis not present

## 2024-06-12 DIAGNOSIS — R109 Unspecified abdominal pain: Secondary | ICD-10-CM | POA: Diagnosis not present

## 2024-06-12 DIAGNOSIS — T83098D Other mechanical complication of other indwelling urethral catheter, subsequent encounter: Secondary | ICD-10-CM | POA: Diagnosis not present

## 2024-06-12 DIAGNOSIS — C3411 Malignant neoplasm of upper lobe, right bronchus or lung: Secondary | ICD-10-CM | POA: Diagnosis not present

## 2024-06-12 DIAGNOSIS — R531 Weakness: Secondary | ICD-10-CM | POA: Diagnosis not present

## 2024-06-12 DIAGNOSIS — I5030 Unspecified diastolic (congestive) heart failure: Secondary | ICD-10-CM

## 2024-06-12 DIAGNOSIS — K5641 Fecal impaction: Secondary | ICD-10-CM | POA: Diagnosis not present

## 2024-06-12 DIAGNOSIS — J449 Chronic obstructive pulmonary disease, unspecified: Secondary | ICD-10-CM | POA: Diagnosis not present

## 2024-06-12 DIAGNOSIS — Z960 Presence of urogenital implants: Secondary | ICD-10-CM | POA: Diagnosis not present

## 2024-06-12 DIAGNOSIS — W19XXXA Unspecified fall, initial encounter: Secondary | ICD-10-CM | POA: Diagnosis not present

## 2024-06-12 DIAGNOSIS — R2689 Other abnormalities of gait and mobility: Secondary | ICD-10-CM | POA: Diagnosis not present

## 2024-06-12 DIAGNOSIS — M6281 Muscle weakness (generalized): Secondary | ICD-10-CM | POA: Diagnosis not present

## 2024-06-12 DIAGNOSIS — M3500 Sicca syndrome, unspecified: Secondary | ICD-10-CM | POA: Diagnosis not present

## 2024-06-12 DIAGNOSIS — R0902 Hypoxemia: Secondary | ICD-10-CM | POA: Diagnosis not present

## 2024-06-12 DIAGNOSIS — R2681 Unsteadiness on feet: Secondary | ICD-10-CM | POA: Diagnosis not present

## 2024-06-12 DIAGNOSIS — Z7401 Bed confinement status: Secondary | ICD-10-CM | POA: Diagnosis not present

## 2024-06-12 NOTE — TOC Transition Note (Addendum)
 Transition of Care [facility]) - Discharge Note   Patient Details  Name: Dana [name] MRN: [id number] Date of Birth: [birth year]  [facility] CM/SW Contact:  Loreda Rodriguez, RN Phone Number:[phone]  06/12/2024, 10:00 AM   Clinical Narrative:    Please call report to [facility] Check Room # [phone]  Patient to be transported to facility by daughter in law [place]. CM has made Marily Shows aware of discharge. CM has asked nurse to coordinate pickup time with daughter in law. There are no other TOC needs. TOC will sign off.   1150 Staff nurse report that patient requires max assist foor transfers and does not feel patient is safe to transport via private vehicle. Nurse has spoken with daughter in law Marily Shows who is agreeable to ambulance transport. Transportation has been arranged per PTAR. D/c packet is at nurses station on packet.      Barriers to Discharge: Continued Medical Work up   Patient Goals and CMS Choice Patient states their goals for this hospitalization and ongoing recovery are:: Ready to go to rehab to get better CMS Medicare.gov Compare Post Acute Care list provided to:: Patient Represenative (must comment) (daughter in law [place]) Choice offered to / list presented to : Patient (daughter in law per patients request) [place] ownership interest in [facility].provided to:: Adult Children    Discharge Placement                       Discharge Plan and Services Additional resources added to the After Visit Summary for   In-house Referral: NA Discharge Planning Services: CM Consult Post Acute Care Choice: Skilled Nursing Facility          DME Arranged: N/A DME Agency: NA       HH Arranged: NA          Social Drivers of Health (SDOH) Interventions SDOH Screenings   Food Insecurity: No Food Insecurity (06/06/2024)  Housing: Low Risk  (06/06/2024)  Transportation Needs: No Transportation Needs (06/06/2024)  Utilities: Not At Risk  (06/06/2024)  Social Connections: Unknown (06/07/2024)  Tobacco Use: Medium Risk (06/07/2024)     Readmission Risk Interventions    06/07/2024    9:53 AM 11/26/2023   11:08 AM  Readmission Risk Prevention Plan  Transportation Screening Complete Complete  PCP or Specialist Appt within 5-7 Days Complete   PCP or Specialist Appt within 3-5 Days  Complete  Home Care Screening Complete   Medication Review (RN CM) Complete   HRI or Home Care Consult  Complete  Social Work Consult for Recovery Care Planning/Counseling  Complete  Palliative Care Screening  Not Applicable  Medication Review Oceanographer)  Referral to Pharmacy

## 2024-06-12 NOTE — Discharge Summary (Signed)
 Physician Discharge Summary   Patient: Dana [name] MRN: [id number] DOB: [birth year]  Admit date:     06/06/2024  Discharge date: 06/12/24  Discharge Physician: Aisha Hove   PCP: [name], MD   Recommendations at discharge:    PCP follow up in 1 week. Urology follow up as scheduled.  Discharge Diagnoses: Principal Problem:   Overflow diarrhea Active Problems:   Cancer of right lung parenchyma (HCC)   Depression   HTN (hypertension)   GERD clinical dx only    Choledocholithiasis   Hypomagnesemia   Rheumatoid arthritis (HCC)   Sicca (HCC)   Sjogren syndrome, unspecified (HCC)   Mild protein malnutrition (HCC)   Heart failure with preserved left ventricular function (HFpEF) (HCC)   Transitional cell bladder cancer (HCC)  Resolved Problems:   * No resolved hospital problems. *  Hospital Course: NAKIRA [name] is a 89 y.o. female with past medical history significant of anemia, osteoarthritis, CAP, diastolic CHF, COPD, interstitial lung disease depression, GERD, nephrolithiasis, lung cancer, pulmonary embolism, hypertension, osteoporosis, Sjogren syndrome, history of C. difficile x 2 presented to hospital with near fall in the bathroom injuring her right forearm and right pretibial area.  Patient had multiple episodes of diarrhea after starting antibiotic following  resection of bladder tumor 4 days back.  In the ED, vitals were stable.  Labs showed CBC of 10.5 hemoglobin 10.7.Urinalysis with moderate hemoglobin, trace leukocyte esterase and a few bacteria.  C. difficile antigen and toxin were negative.   Coronavirus, influenza and RSV PCR were negative.  Lactic acid was normal.  Lipase was 298 units/L.  Magnesium  1.3 mg/dL.  LFT with AST 812 and ALT 46 units/L. Portable chest radiograph with no acute cardiopulmonary abnormality.  Similar appearance of the right lung status post treated for right pulmonary malignancy.  Ultrasound of the abdomen Limited visualization of  the gallbladder.  No gallstone identified.  Mild dilatation of the CBD measuring 9 mm.  MRCP suggested.  Patient was then admitted to the hospital for further evaluation and treatment.   Assessment and Plan: Acute agitated delirium.  Improved.  Likely hospital induced and secondary to urinary retention.  In-N-Out catheterization was done significant amount of urine was drained.   Patient mental status back to baseline.  Continue delirium precautions.   Overflow diarrhea from rectal fecal impaction During hospitalization, patient received IV fluids.  C. difficile toxin was negative.  GI pathogen panel was negative.  Was initially on Rocephin  and Flagyl.  GI was also consulted.  Abdominal x-ray with possible rectal impaction.  Likely overflow diarrhea.  Continue good bowel regimen upon discharge.   Elevated LFTs with dilated CBD on ultrasound.  GI recommended MRCP.  MRCP showed mild intra and extrahepatic biliary dilatation CBD up to 3.7 cm without calculus.  This is unchanged compared to previous MRI.    LFTs improved..  GI has signed off at this time.   Transitional cell bladder cancer Status post urinary retention. Required In-N-Out catheter with removal of significant amount of urine initially followed by second In-N-Out cath.  Communicated with Dr. Aimee Alf urology about it and he is okay with Foley catheter if needed with outpatient voiding trial in the clinic in 1 week.  Of note urology had flexible cystoscopy with urology 5 days ago.  Plan for outpatient follow-up with urology as outpatient.  Biopsy was good as per the patient's family.  Had BCG in the past.  Continue MiraLAX   and stool softeners to ensure adequate bowel movements  Cancer of right lung parenchyma  Follow-up with oncology as outpatient.  Has been on Tarceva  for 15 years.    Depression Continue mirtazapine .   Hypomagnesemia Improved after supplementation.   Rheumatoid arthritis with  Sicca  Sjogren syndrome,  unspecified  Continue Systane drops.  Follow-up with rheumatology as outpatient.   Heart failure with preserved left ventricular function (HFpEF)  Currently compensated.        Consultants: GI, urology Procedures performed: none  Disposition: Skilled nursing facility Diet recommendation:  Discharge Diet Orders (From admission, onward)     Start     Ordered   06/09/24 0000  Diet - low sodium heart healthy        06/09/24 1050           Cardiac diet DISCHARGE MEDICATION: Allergies as of 06/12/2024       Reactions   Methotrexate Anaphylaxis, Shortness Of Breath   Clindamycin  Hcl Diarrhea   Lactose Intolerance (gi) Other (See Comments)   Upsets the stomach   Amoxicillin Diarrhea   Hx of C Diff    Protonix  [pantoprazole  Sodium] Rash        Medication List     STOP taking these medications    loperamide 2 MG tablet Commonly known as: IMODIUM A-D   methocarbamol  750 MG tablet Commonly known as: ROBAXIN        TAKE these medications    acetaminophen  650 MG CR tablet Commonly known as: TYLENOL  Take 650 mg by mouth in the morning.   AMBULATORY NON FORMULARY MEDICATION R knee brace Dispense 1 Dx code: M17.11 Use as needed   BIOTIN PO Take 1 capsule by mouth daily.   bismuth subsalicylate 262 MG chewable tablet Commonly known as: PEPTO BISMOL Chew 262 mg by mouth as needed for indigestion or diarrhea or loose stools.   Calcium  600+D Plus Minerals 600-400 MG-UNIT Tabs Take 1 tablet by mouth 2 (two) times daily.   erlotinib  100 MG tablet Commonly known as: Tarceva  Take 1 tablet (100 mg total) by mouth daily. What changed: when to take this   loratadine  10 MG tablet Commonly known as: CLARITIN  Take 10 mg by mouth daily as needed (for seasonal allergies).   mirtazapine  15 MG tablet Commonly known as: REMERON  TAKE 1 TABLET BY MOUTH AT BEDTIME   polyethylene glycol 17 g packet Commonly known as: MiraLax  Take 17 g by mouth daily. What changed:   when to take this reasons to take this   PRESERVISION AREDS 2 PO Take 1 capsule by mouth in the morning and at bedtime.   Probiotic Acidophilus Chew Chew 1 tablet by mouth in the morning.   Stiolto Respimat  2.5-2.5 MCG/ACT Aers Generic drug: Tiotropium Bromide -Olodaterol INHALE 2 PUFFS BY MOUTH ONCE DAILY   SYSTANE OP Place 1 drop into both eyes in the morning and at bedtime.   VITAMIN B-12 PO Take 1 tablet by mouth daily.   XYLITOL MT 1 tablet See admin instructions. Place 1 melt tablet in the gumline at bedtime as directed        Follow-up Information     [name], MD Follow up in 1 week(s).   Specialty: Family Medicine Contact information: [address] [place] [address] [phone]                Discharge Exam: Filed Weights   06/06/24 0729  Weight: 45.4 kg      06/12/2024    7:38 AM 06/12/2024    2:53 AM 06/11/2024  11:00 PM  Vitals with BMI  Systolic 112 138 161  Diastolic 67 77 80  Pulse 105 110 106    General - Elderly Caucasian female, no apparent distress HEENT - PERRLA, EOMI, atraumatic head, non tender sinuses. Lung - Clear, basal rales, no rhonchi, wheezes. Heart - S1, S2 heard, no murmurs, rubs, no pedal edema. Abdomen - Soft, non tender, bowel sounds good, foley noted. Neuro - Alert, awake and oriented, non focal exam. Skin - Warm and dry.  Condition at discharge: stable  The results of significant diagnostics from this hospitalization (including imaging, microbiology, ancillary and laboratory) are listed below for reference.   Imaging Studies: DG Abd 2 Views Result Date: 06/08/2024 CLINICAL DATA:  Abdominal pain and distension 2 weeks.  Diarrhea. EXAM: ABDOMEN - 2 VIEW COMPARISON:  06/17/2019 FINDINGS: Bowel gas pattern is nonobstructive. Mild fecal retention over the rectum. No free peritoneal air. Stable right base opacification likely effusion/atelectasis. Moderate degenerative changes of the curvature of the  thoracolumbar spine convex right unchanged. Mild symmetric osteoarthritic change of the hips. Diffuse decreased bone mineralization. IMPRESSION: 1. No acute findings.  Mild fecal retention over the rectum. 2. Stable right base opacification likely effusion/atelectasis. Electronically Signed   By: Roda Cirri M.D.   On: 06/08/2024 13:29   MR 3D Recon At Scanner Result Date: 06/08/2024 CLINICAL DATA:  Pancreatitis suspected EXAM: MRI ABDOMEN WITHOUT AND WITH CONTRAST (INCLUDING MRCP) TECHNIQUE: Multiplanar multisequence MR imaging of the abdomen was performed both before and after the administration of intravenous contrast. Heavily T2-weighted images of the biliary and pancreatic ducts were obtained, and three-dimensional MRCP images were rendered by post processing. CONTRAST:  5mL GADAVIST GADOBUTROL 1 MMOL/ML IV SOLN COMPARISON:  Right upper quadrant ultrasound, 06/06/2024, CT abdomen pelvis, 09/30/2023 FINDINGS: Lower chest: Cardiomegaly. Small right pleural effusion with post treatment appearance of the right lung base. Hepatobiliary: No solid liver abnormality is seen. Tiny gallstones (series 4, image 22). Mild intra and extrahepatic biliary ductal dilatation common bile duct measuring up to 0.7 cm within the pancreatic head and tapering to the ampulla without calculus or other obstruction visible (series 3, image 11). Pancreas: Unremarkable. No pancreatic ductal dilatation or surrounding inflammatory changes. Spleen: Normal in size without significant abnormality. Adrenals/Urinary Tract: Adrenal glands are unremarkable. Kidneys are normal, without obvious renal calculi, solid lesion, or hydronephrosis. Stomach/Bowel: Stomach is within normal limits. No evidence of bowel wall thickening, distention, or inflammatory changes. Vascular/Lymphatic: No significant vascular findings are present. No enlarged abdominal lymph nodes. Other: No abdominal wall hernia or abnormality. No ascites. Musculoskeletal: No acute  or significant osseous findings. IMPRESSION: 1. Mild intra and extrahepatic biliary ductal dilatation, common bile duct measuring up to 0.7 cm within the pancreatic head and tapering to the ampulla without calculus or other obstruction visible. This is unchanged compared to examination dated 09/30/2023 and of uncertain significance, possibly within normal limits for advanced patient age. 2. Tiny gallstones. No gallbladder distention or gallbladder wall thickening. 3. No MR evidence of pancreatitis. 4. Cardiomegaly. 5. Small right pleural effusion with post treatment appearance of the right lung base, in keeping with known primary lung malignancy. Electronically Signed   By: Fredricka Jenny M.D.   On: 06/08/2024 11:09   MR ABDOMEN MRCP W WO CONTAST Result Date: 06/06/2024 CLINICAL DATA:  Pancreatitis suspected EXAM: MRI ABDOMEN WITHOUT AND WITH CONTRAST (INCLUDING MRCP) TECHNIQUE: Multiplanar multisequence MR imaging of the abdomen was performed both before and after the administration of intravenous contrast. Heavily T2-weighted images of the biliary  and pancreatic ducts were obtained, and three-dimensional MRCP images were rendered by post processing. CONTRAST:  5mL GADAVIST GADOBUTROL 1 MMOL/ML IV SOLN COMPARISON:  Right upper quadrant ultrasound, 06/06/2024, CT abdomen pelvis, 09/30/2023 FINDINGS: Lower chest: Cardiomegaly. Small right pleural effusion with post treatment appearance of the right lung base. Hepatobiliary: No solid liver abnormality is seen. Tiny gallstones (series 4, image 22). Mild intra and extrahepatic biliary ductal dilatation common bile duct measuring up to 0.7 cm within the pancreatic head and tapering to the ampulla without calculus or other obstruction visible (series 3, image 11). Pancreas: Unremarkable. No pancreatic ductal dilatation or surrounding inflammatory changes. Spleen: Normal in size without significant abnormality. Adrenals/Urinary Tract: Adrenal glands are unremarkable.  Kidneys are normal, without obvious renal calculi, solid lesion, or hydronephrosis. Stomach/Bowel: Stomach is within normal limits. No evidence of bowel wall thickening, distention, or inflammatory changes. Vascular/Lymphatic: No significant vascular findings are present. No enlarged abdominal lymph nodes. Other: No abdominal wall hernia or abnormality. No ascites. Musculoskeletal: No acute or significant osseous findings. IMPRESSION: 1. Mild intra and extrahepatic biliary ductal dilatation, common bile duct measuring up to 0.7 cm within the pancreatic head and tapering to the ampulla without calculus or other obstruction visible. This is unchanged compared to examination dated 09/30/2023 and of uncertain significance, possibly within normal limits for advanced patient age. 2. Tiny gallstones. No gallbladder distention or gallbladder wall thickening. 3. No MR evidence of pancreatitis. 4. Cardiomegaly. 5. Small right pleural effusion with post treatment appearance of the right lung base, in keeping with known primary lung malignancy. Electronically Signed   By: Fredricka Jenny M.D.   On: 06/06/2024 21:15   US  Abdomen Limited RUQ (LIVER/GB) Result Date: 06/06/2024 CLINICAL DATA:  Elevated liver function tests. EXAM: ULTRASOUND ABDOMEN LIMITED RIGHT UPPER QUADRANT COMPARISON:  None Available. FINDINGS: Technically difficult exam due to habitus and patient's inability to cooperate with positioning. Gallbladder: Limited visualization. No gallstones or wall thickening visualized. No sonographic Murphy sign noted by sonographer. Common bile duct: Diameter: Mildly dilated measuring 9 mm in diameter. Liver: No focal lesion identified. Within normal limits in parenchymal echogenicity. Portal vein is patent on color Doppler imaging with normal direction of blood flow towards the liver. Other: None. IMPRESSION: Technically difficult exam. Limited visualization of the gallbladder. No gallstones identified. Mild dilatation of  common bile duct measuring 9 mm. Consider abdomen MRI and MRCP without and with contrast for further evaluation if clinically warranted. Electronically Signed   By: Marlyce Sine M.D.   On: 06/06/2024 14:17   DG Chest Portable 1 View Result Date: 06/06/2024 CLINICAL DATA:  cough, h/o lung cancer EXAM: PORTABLE CHEST 1 VIEW COMPARISON:  December 31, 2023 FINDINGS: The cardiomediastinal silhouette is unchanged in contour. Unchanged appearance of RIGHT-sided volume loss. Similar appearance of pleural blunting consistent with chronic small pleural effusion. Similar widening along the RIGHT hilar border consistent history of treated malignancy. No pneumothorax. No acute pleuroparenchymal abnormality. IMPRESSION: 1. No acute cardiopulmonary abnormality. 2. Similar appearance of the RIGHT lung status post treated RIGHT pulmonary malignancy. Electronically Signed   By: Clancy Crimes M.D.   On: 06/06/2024 08:35   DG C-Arm 1-60 Min-No Report Result Date: 06/02/2024 Fluoroscopy was utilized by the requesting physician.  No radiographic interpretation.    Microbiology: Results for orders placed or performed during the hospital encounter of 06/06/24  Resp panel by RT-PCR (RSV, Flu A&B, Covid) Anterior Nasal Swab     Status: None   Collection Time: 06/06/24  8:17 AM  Specimen: Anterior Nasal Swab  Result Value Ref Range Status   SARS Coronavirus 2 by RT PCR NEGATIVE NEGATIVE Final    Comment: (NOTE) SARS-CoV-2 target nucleic acids are NOT DETECTED.  The SARS-CoV-2 RNA is generally detectable in upper respiratory specimens during the acute phase of infection. The lowest concentration of SARS-CoV-2 viral copies this assay can detect is 138 copies/mL. A negative result does not preclude SARS-Cov-2 infection and should not be used as the sole basis for treatment or other patient management decisions. A negative result may occur with  improper specimen collection/handling, submission of specimen other than  nasopharyngeal swab, presence of viral mutation(s) within the areas targeted by this assay, and inadequate number of viral copies(<138 copies/mL). A negative result must be combined with clinical observations, patient history, and epidemiological information. The expected result is Negative.  Fact Sheet for Patients:  BloggerCourse.com  Fact Sheet for Healthcare Providers:  SeriousBroker.it  This test is no t yet approved or cleared by the [place]  FDA and  has been authorized for detection and/or diagnosis of SARS-CoV-2 by FDA under an Emergency Use Authorization (EUA). This EUA will remain  in effect (meaning this test can be used) for the duration of the COVID-19 declaration under Section 564(b)(1) of the Act, 21 U.S.C.section 360bbb-3(b)(1), unless the authorization is terminated  or revoked sooner.       Influenza A by PCR NEGATIVE NEGATIVE Final   Influenza B by PCR NEGATIVE NEGATIVE Final    Comment: (NOTE) The Xpert Xpress SARS-CoV-2/FLU/RSV plus assay is intended as an aid in the diagnosis of influenza from Nasopharyngeal swab specimens and should not be used as a sole basis for treatment. Nasal washings and aspirates are unacceptable for Xpert Xpress SARS-CoV-2/FLU/RSV testing.  Fact Sheet for Patients: BloggerCourse.com  Fact Sheet for Healthcare Providers: SeriousBroker.it  This test is not yet approved or cleared by the [place]  FDA and has been authorized for detection and/or diagnosis of SARS-CoV-2 by FDA under an Emergency Use Authorization (EUA). This EUA will remain in effect (meaning this test can be used) for the duration of the COVID-19 declaration under Section 564(b)(1) of the Act, 21 U.S.C. section 360bbb-3(b)(1), unless the authorization is terminated or revoked.     Resp Syncytial Virus by PCR NEGATIVE NEGATIVE Final    Comment:  (NOTE) Fact Sheet for Patients: BloggerCourse.com  Fact Sheet for Healthcare Providers: SeriousBroker.it  This test is not yet approved or cleared by the [place]  FDA and has been authorized for detection and/or diagnosis of SARS-CoV-2 by FDA under an Emergency Use Authorization (EUA). This EUA will remain in effect (meaning this test can be used) for the duration of the COVID-19 declaration under Section 564(b)(1) of the Act, 21 U.S.C. section 360bbb-3(b)(1), unless the authorization is terminated or revoked.  Performed at [facility], [address] [place], [place] [address]   Blood culture (routine x 2)     Status: None   Collection Time: 06/06/24  1:07 PM   Specimen: BLOOD LEFT HAND  Result Value Ref Range Status   Specimen Description   Final    BLOOD LEFT HAND Performed at [facility], [address], [place], [place] [address]    Special Requests   Final    BOTTLES DRAWN AEROBIC ONLY Blood Culture results may not be optimal due to an inadequate volume of blood received in culture bottles Performed at [facility], [address], [place], [place] [address]  Culture   Final    NO GROWTH 5 DAYS Performed at [facility], [address], [place], [place] [address]    Report Status 06/11/2024 FINAL  Final  C Difficile Quick Screen w PCR reflex     Status: None   Collection Time: 06/06/24  1:56 PM   Specimen: Stool  Result Value Ref Range Status   C Diff antigen NEGATIVE NEGATIVE Final   C Diff toxin NEGATIVE NEGATIVE Final   C Diff interpretation No C. difficile detected.  Final    Comment: Performed at [facility], [address] [place], [place] [address]  Gastrointestinal Panel by PCR , Stool     Status: None   Collection Time: 06/06/24  1:57 PM   Specimen: Stool  Result Value Ref Range Status   Campylobacter species NOT DETECTED NOT  DETECTED Final   Plesimonas shigelloides NOT DETECTED NOT DETECTED Final   Salmonella species NOT DETECTED NOT DETECTED Final   Yersinia enterocolitica NOT DETECTED NOT DETECTED Final   Vibrio species NOT DETECTED NOT DETECTED Final   Vibrio cholerae NOT DETECTED NOT DETECTED Final   Enteroaggregative E coli (EAEC) NOT DETECTED NOT DETECTED Final   Enteropathogenic E coli (EPEC) NOT DETECTED NOT DETECTED Final   Enterotoxigenic E coli (ETEC) NOT DETECTED NOT DETECTED Final   Shiga like toxin producing E coli (STEC) NOT DETECTED NOT DETECTED Final   Shigella/Enteroinvasive E coli (EIEC) NOT DETECTED NOT DETECTED Final   Cryptosporidium NOT DETECTED NOT DETECTED Final   Cyclospora cayetanensis NOT DETECTED NOT DETECTED Final   Entamoeba histolytica NOT DETECTED NOT DETECTED Final   Giardia lamblia NOT DETECTED NOT DETECTED Final   Adenovirus F40/41 NOT DETECTED NOT DETECTED Final   Astrovirus NOT DETECTED NOT DETECTED Final   Norovirus GI/GII NOT DETECTED NOT DETECTED Final   Rotavirus A NOT DETECTED NOT DETECTED Final   Sapovirus (I, II, IV, and V) NOT DETECTED NOT DETECTED Final    Comment: Performed at [facility], [address] [place], [place] [address]  Blood culture (routine x 2)     Status: None   Collection Time: 06/06/24  6:06 PM   Specimen: BLOOD  Result Value Ref Range Status   Specimen Description   Final    BLOOD SITE NOT SPECIFIED Performed at [facility], [address] [place], [place] [address]    Special Requests   Final    BOTTLES DRAWN AEROBIC ONLY Blood Culture results may not be optimal due to an inadequate volume of blood received in culture bottles Performed at [facility], [address], [place], [place] [address]    Culture   Final    NO GROWTH 5 DAYS Performed at [facility], [address] [place], [place] [address]    Report Status 06/11/2024 FINAL  Final   *Note: Due to a large number of  results and/or encounters for the requested time period, some results have not been displayed. A complete set of results can be found in Results Review.    Labs: CBC: Recent Labs  Lab 06/06/24 0817 06/07/24 0639 06/08/24 0521 06/09/24 0620 06/10/24 0500 06/11/24 0550  WBC 10.5 18.8* 14.1* 10.8* 11.0* 9.5  NEUTROABS 9.1*  --   --   --   --   --   HGB 10.7* 12.6 11.5* 11.5* 11.3* 9.8*  HCT 31.7* 38.8 35.5* 34.0* 34.7* 29.8*  MCV 89.0 92.4 92.7 89.5 91.1 92.0  PLT 286  292 257 309 309 266   Basic Metabolic Panel: Recent Labs  Lab 06/06/24 1200 06/06/24 1806 06/07/24 0639 06/08/24 0521 06/09/24 0620 06/10/24 0500 06/11/24 0550  NA 136  --  137 136 135 135 137  K 3.7  --  3.6 4.4 3.8 3.6 3.9  CL 101  --  102 106 100 101 101  CO2 26  --  24 22 25 24 27   GLUCOSE 98  --  82 94 114* 102* 92  BUN 29*  --  20 27* 29* 31* 35*  CREATININE 0.69  --  0.69 0.90 0.94 0.83 0.88  CALCIUM  9.6  --  9.4 8.9 9.1 9.2 9.0  MG 1.3*  --   --  1.5*  --  1.4* 2.1  PHOS  --  2.5  --   --   --   --   --    Liver Function Tests: Recent Labs  Lab 06/06/24 1200 06/07/24 0639 06/10/24 0500  AST 112* 66* 45*  ALT 46* 40 46*  ALKPHOS 56 67 65  BILITOT 1.3* 1.2 0.8  PROT 5.5* 5.8* 5.4*  ALBUMIN 3.0* 2.8* 2.6*   CBG: No results for input(s): GLUCAP in the last 168 hours.  Discharge time spent: 35 minutes.  Signed: Aisha Hove, MD [facility] 06/12/2024

## 2024-06-12 NOTE — Plan of Care (Signed)

## 2024-06-12 NOTE — TOC Progression Note (Addendum)
 Transition of Care [facility]) - Progression Note    Patient Details  Name: Dana [name] MRN: [id number] Date of Birth: 12-14-1935  Transition of Care [facility]) CM/SW Contact  Loreda Rodriguez, RN Phone Number:[phone]  06/12/2024, 9:56 AM  Clinical Narrative:    Insurance [employer] auth ID # [id number] Auth ID [id number] 6/19-6/23. CM has updated Estate manager/land agent at [employer]. Facility can accept patient today. Message has been sent to MD.    Expected Discharge Plan: Skilled Nursing Facility Barriers to Discharge: Continued Medical Work up  Expected Discharge Plan and Services In-house Referral: NA Discharge Planning Services: CM Consult Post Acute Care Choice: Skilled Nursing Facility Living arrangements for the past 2 months: Single Family Home Expected Discharge Date: 06/09/24               DME Arranged: N/A DME Agency: NA       HH Arranged: NA           Social Determinants of Health (SDOH) Interventions SDOH Screenings   Food Insecurity: No Food Insecurity (06/06/2024)  Housing: Low Risk  (06/06/2024)  Transportation Needs: No Transportation Needs (06/06/2024)  Utilities: Not At Risk (06/06/2024)  Social Connections: Unknown (06/07/2024)  Tobacco Use: Medium Risk (06/07/2024)    Readmission Risk Interventions    06/07/2024    9:53 AM 11/26/2023   11:08 AM  Readmission Risk Prevention Plan  Transportation Screening Complete Complete  PCP or Specialist Appt within 5-7 Days Complete   PCP or Specialist Appt within 3-5 Days  Complete  Home Care Screening Complete   Medication Review (RN CM) Complete   HRI or Home Care Consult  Complete  Social Work Consult for Recovery Care Planning/Counseling  Complete  Palliative Care Screening  Not Applicable  Medication Review [employer])  Referral to Pharmacy

## 2024-06-15 DIAGNOSIS — R7989 Other specified abnormal findings of blood chemistry: Secondary | ICD-10-CM | POA: Diagnosis not present

## 2024-06-15 DIAGNOSIS — K5641 Fecal impaction: Secondary | ICD-10-CM | POA: Diagnosis not present

## 2024-06-15 DIAGNOSIS — R1915 Other abnormal bowel sounds: Secondary | ICD-10-CM | POA: Diagnosis not present

## 2024-06-15 DIAGNOSIS — M3500 Sicca syndrome, unspecified: Secondary | ICD-10-CM | POA: Diagnosis not present

## 2024-06-15 DIAGNOSIS — C679 Malignant neoplasm of bladder, unspecified: Secondary | ICD-10-CM | POA: Diagnosis not present

## 2024-06-15 DIAGNOSIS — C3491 Malignant neoplasm of unspecified part of right bronchus or lung: Secondary | ICD-10-CM | POA: Diagnosis not present

## 2024-06-15 DIAGNOSIS — W19XXXA Unspecified fall, initial encounter: Secondary | ICD-10-CM | POA: Diagnosis not present

## 2024-06-15 DIAGNOSIS — I5030 Unspecified diastolic (congestive) heart failure: Secondary | ICD-10-CM | POA: Diagnosis not present

## 2024-06-15 DIAGNOSIS — M069 Rheumatoid arthritis, unspecified: Secondary | ICD-10-CM | POA: Diagnosis not present

## 2024-06-16 DIAGNOSIS — I5030 Unspecified diastolic (congestive) heart failure: Secondary | ICD-10-CM | POA: Diagnosis not present

## 2024-06-16 DIAGNOSIS — M069 Rheumatoid arthritis, unspecified: Secondary | ICD-10-CM | POA: Diagnosis not present

## 2024-06-16 DIAGNOSIS — M6281 Muscle weakness (generalized): Secondary | ICD-10-CM | POA: Diagnosis not present

## 2024-06-16 DIAGNOSIS — R2689 Other abnormalities of gait and mobility: Secondary | ICD-10-CM | POA: Diagnosis not present

## 2024-06-16 DIAGNOSIS — J449 Chronic obstructive pulmonary disease, unspecified: Secondary | ICD-10-CM | POA: Diagnosis not present

## 2024-06-16 DIAGNOSIS — K521 Toxic gastroenteritis and colitis: Secondary | ICD-10-CM | POA: Diagnosis not present

## 2024-06-17 DIAGNOSIS — M3500 Sicca syndrome, unspecified: Secondary | ICD-10-CM | POA: Diagnosis not present

## 2024-06-17 DIAGNOSIS — K5641 Fecal impaction: Secondary | ICD-10-CM | POA: Diagnosis not present

## 2024-06-17 DIAGNOSIS — C679 Malignant neoplasm of bladder, unspecified: Secondary | ICD-10-CM | POA: Diagnosis not present

## 2024-06-17 DIAGNOSIS — C3491 Malignant neoplasm of unspecified part of right bronchus or lung: Secondary | ICD-10-CM | POA: Diagnosis not present

## 2024-06-17 DIAGNOSIS — I5030 Unspecified diastolic (congestive) heart failure: Secondary | ICD-10-CM | POA: Diagnosis not present

## 2024-06-17 DIAGNOSIS — M069 Rheumatoid arthritis, unspecified: Secondary | ICD-10-CM | POA: Diagnosis not present

## 2024-06-17 DIAGNOSIS — W19XXXA Unspecified fall, initial encounter: Secondary | ICD-10-CM | POA: Diagnosis not present

## 2024-06-17 DIAGNOSIS — R7989 Other specified abnormal findings of blood chemistry: Secondary | ICD-10-CM | POA: Diagnosis not present

## 2024-06-18 ENCOUNTER — Telehealth: Payer: Self-pay | Admitting: Medical Oncology

## 2024-06-18 NOTE — Telephone Encounter (Signed)
 Follow-up phone call from [place] . Dana [name] was recently discharged from the hospital and is now residing at [facility] for weakness. She requires maximal assistance and is currently working with physical therapy to regain strength with the goal of returning to her baseline. Her cognitive function has returned to baseline, but her physical condition has not fully recovered.  The family member reported that Dana [name] is not eating well and inquired if it would be possible to hold Tarceva  for a while.  Dana [name]'s next appointment is scheduled for 07/21/24. She had CT scans while in the hospital recently.

## 2024-06-19 ENCOUNTER — Telehealth: Payer: Self-pay | Admitting: Medical Oncology

## 2024-06-19 DIAGNOSIS — W19XXXA Unspecified fall, initial encounter: Secondary | ICD-10-CM | POA: Diagnosis not present

## 2024-06-19 DIAGNOSIS — I509 Heart failure, unspecified: Secondary | ICD-10-CM | POA: Diagnosis not present

## 2024-06-19 DIAGNOSIS — R7989 Other specified abnormal findings of blood chemistry: Secondary | ICD-10-CM | POA: Diagnosis not present

## 2024-06-19 DIAGNOSIS — C679 Malignant neoplasm of bladder, unspecified: Secondary | ICD-10-CM | POA: Diagnosis not present

## 2024-06-19 DIAGNOSIS — M3500 Sicca syndrome, unspecified: Secondary | ICD-10-CM | POA: Diagnosis not present

## 2024-06-19 DIAGNOSIS — M069 Rheumatoid arthritis, unspecified: Secondary | ICD-10-CM | POA: Diagnosis not present

## 2024-06-19 DIAGNOSIS — C3491 Malignant neoplasm of unspecified part of right bronchus or lung: Secondary | ICD-10-CM | POA: Diagnosis not present

## 2024-06-19 DIAGNOSIS — I5030 Unspecified diastolic (congestive) heart failure: Secondary | ICD-10-CM | POA: Diagnosis not present

## 2024-06-19 DIAGNOSIS — K5641 Fecal impaction: Secondary | ICD-10-CM | POA: Diagnosis not present

## 2024-06-19 NOTE — Telephone Encounter (Signed)
 Hold Tarceva -I spoke to [facility]. Per Dr  Sherrod I told him that patient may hold her Tarceva  now until further notice. Note faxed to [facility] with this information.

## 2024-06-19 NOTE — Telephone Encounter (Signed)
 Successful fax to [facility] rehab alternative fax number.

## 2024-06-21 DIAGNOSIS — R7989 Other specified abnormal findings of blood chemistry: Secondary | ICD-10-CM | POA: Diagnosis not present

## 2024-06-21 DIAGNOSIS — M3500 Sicca syndrome, unspecified: Secondary | ICD-10-CM | POA: Diagnosis not present

## 2024-06-21 DIAGNOSIS — K5641 Fecal impaction: Secondary | ICD-10-CM | POA: Diagnosis not present

## 2024-06-21 DIAGNOSIS — C3491 Malignant neoplasm of unspecified part of right bronchus or lung: Secondary | ICD-10-CM | POA: Diagnosis not present

## 2024-06-21 DIAGNOSIS — M069 Rheumatoid arthritis, unspecified: Secondary | ICD-10-CM | POA: Diagnosis not present

## 2024-06-21 DIAGNOSIS — W19XXXA Unspecified fall, initial encounter: Secondary | ICD-10-CM | POA: Diagnosis not present

## 2024-06-21 DIAGNOSIS — I5030 Unspecified diastolic (congestive) heart failure: Secondary | ICD-10-CM | POA: Diagnosis not present

## 2024-06-21 DIAGNOSIS — C679 Malignant neoplasm of bladder, unspecified: Secondary | ICD-10-CM | POA: Diagnosis not present

## 2024-06-22 DIAGNOSIS — C3411 Malignant neoplasm of upper lobe, right bronchus or lung: Secondary | ICD-10-CM | POA: Diagnosis not present

## 2024-06-22 DIAGNOSIS — R197 Diarrhea, unspecified: Secondary | ICD-10-CM | POA: Diagnosis not present

## 2024-06-22 DIAGNOSIS — M3500 Sicca syndrome, unspecified: Secondary | ICD-10-CM | POA: Diagnosis not present

## 2024-06-22 DIAGNOSIS — K5641 Fecal impaction: Secondary | ICD-10-CM | POA: Diagnosis not present

## 2024-06-22 DIAGNOSIS — Z960 Presence of urogenital implants: Secondary | ICD-10-CM | POA: Diagnosis not present

## 2024-06-22 DIAGNOSIS — M069 Rheumatoid arthritis, unspecified: Secondary | ICD-10-CM | POA: Diagnosis not present

## 2024-06-22 DIAGNOSIS — K838 Other specified diseases of biliary tract: Secondary | ICD-10-CM | POA: Diagnosis not present

## 2024-06-22 DIAGNOSIS — N139 Obstructive and reflux uropathy, unspecified: Secondary | ICD-10-CM | POA: Diagnosis not present

## 2024-06-22 DIAGNOSIS — R7989 Other specified abnormal findings of blood chemistry: Secondary | ICD-10-CM | POA: Diagnosis not present

## 2024-06-22 DIAGNOSIS — I5030 Unspecified diastolic (congestive) heart failure: Secondary | ICD-10-CM | POA: Diagnosis not present

## 2024-06-22 DIAGNOSIS — C679 Malignant neoplasm of bladder, unspecified: Secondary | ICD-10-CM | POA: Diagnosis not present

## 2024-06-23 DIAGNOSIS — T8189XA Other complications of procedures, not elsewhere classified, initial encounter: Secondary | ICD-10-CM | POA: Diagnosis not present

## 2024-06-23 DIAGNOSIS — M6281 Muscle weakness (generalized): Secondary | ICD-10-CM | POA: Diagnosis not present

## 2024-06-23 DIAGNOSIS — M069 Rheumatoid arthritis, unspecified: Secondary | ICD-10-CM | POA: Diagnosis not present

## 2024-06-23 DIAGNOSIS — K521 Toxic gastroenteritis and colitis: Secondary | ICD-10-CM | POA: Diagnosis not present

## 2024-06-23 DIAGNOSIS — J449 Chronic obstructive pulmonary disease, unspecified: Secondary | ICD-10-CM | POA: Diagnosis not present

## 2024-06-23 DIAGNOSIS — R2689 Other abnormalities of gait and mobility: Secondary | ICD-10-CM | POA: Diagnosis not present

## 2024-06-23 DIAGNOSIS — I5030 Unspecified diastolic (congestive) heart failure: Secondary | ICD-10-CM | POA: Diagnosis not present

## 2024-06-24 DIAGNOSIS — N139 Obstructive and reflux uropathy, unspecified: Secondary | ICD-10-CM | POA: Diagnosis not present

## 2024-06-24 DIAGNOSIS — D414 Neoplasm of uncertain behavior of bladder: Secondary | ICD-10-CM | POA: Diagnosis not present

## 2024-06-24 DIAGNOSIS — R197 Diarrhea, unspecified: Secondary | ICD-10-CM | POA: Diagnosis not present

## 2024-06-24 DIAGNOSIS — R7989 Other specified abnormal findings of blood chemistry: Secondary | ICD-10-CM | POA: Diagnosis not present

## 2024-06-24 DIAGNOSIS — E860 Dehydration: Secondary | ICD-10-CM | POA: Diagnosis not present

## 2024-06-24 DIAGNOSIS — C672 Malignant neoplasm of lateral wall of bladder: Secondary | ICD-10-CM | POA: Diagnosis not present

## 2024-06-24 DIAGNOSIS — R338 Other retention of urine: Secondary | ICD-10-CM | POA: Diagnosis not present

## 2024-06-24 DIAGNOSIS — K5641 Fecal impaction: Secondary | ICD-10-CM | POA: Diagnosis not present

## 2024-06-24 DIAGNOSIS — C3490 Malignant neoplasm of unspecified part of unspecified bronchus or lung: Secondary | ICD-10-CM | POA: Diagnosis not present

## 2024-06-24 DIAGNOSIS — I509 Heart failure, unspecified: Secondary | ICD-10-CM | POA: Diagnosis not present

## 2024-06-24 DIAGNOSIS — C679 Malignant neoplasm of bladder, unspecified: Secondary | ICD-10-CM | POA: Diagnosis not present

## 2024-06-25 ENCOUNTER — Other Ambulatory Visit: Payer: Self-pay

## 2024-06-26 DIAGNOSIS — R790 Abnormal level of blood mineral: Secondary | ICD-10-CM | POA: Diagnosis not present

## 2024-06-29 ENCOUNTER — Other Ambulatory Visit: Payer: Self-pay

## 2024-06-29 DIAGNOSIS — N139 Obstructive and reflux uropathy, unspecified: Secondary | ICD-10-CM | POA: Diagnosis not present

## 2024-06-29 DIAGNOSIS — C3490 Malignant neoplasm of unspecified part of unspecified bronchus or lung: Secondary | ICD-10-CM | POA: Diagnosis not present

## 2024-06-29 DIAGNOSIS — I509 Heart failure, unspecified: Secondary | ICD-10-CM | POA: Diagnosis not present

## 2024-06-29 DIAGNOSIS — R627 Adult failure to thrive: Secondary | ICD-10-CM | POA: Diagnosis not present

## 2024-06-29 DIAGNOSIS — Z960 Presence of urogenital implants: Secondary | ICD-10-CM | POA: Diagnosis not present

## 2024-06-29 DIAGNOSIS — C679 Malignant neoplasm of bladder, unspecified: Secondary | ICD-10-CM | POA: Diagnosis not present

## 2024-06-29 DIAGNOSIS — E860 Dehydration: Secondary | ICD-10-CM | POA: Diagnosis not present

## 2024-06-29 DIAGNOSIS — Z515 Encounter for palliative care: Secondary | ICD-10-CM | POA: Diagnosis not present

## 2024-06-29 DIAGNOSIS — R339 Retention of urine, unspecified: Secondary | ICD-10-CM | POA: Diagnosis not present

## 2024-06-30 DIAGNOSIS — J449 Chronic obstructive pulmonary disease, unspecified: Secondary | ICD-10-CM | POA: Diagnosis not present

## 2024-06-30 DIAGNOSIS — T8189XA Other complications of procedures, not elsewhere classified, initial encounter: Secondary | ICD-10-CM | POA: Diagnosis not present

## 2024-06-30 DIAGNOSIS — K521 Toxic gastroenteritis and colitis: Secondary | ICD-10-CM | POA: Diagnosis not present

## 2024-06-30 DIAGNOSIS — R2689 Other abnormalities of gait and mobility: Secondary | ICD-10-CM | POA: Diagnosis not present

## 2024-06-30 DIAGNOSIS — M6281 Muscle weakness (generalized): Secondary | ICD-10-CM | POA: Diagnosis not present

## 2024-06-30 DIAGNOSIS — I5030 Unspecified diastolic (congestive) heart failure: Secondary | ICD-10-CM | POA: Diagnosis not present

## 2024-06-30 DIAGNOSIS — L988 Other specified disorders of the skin and subcutaneous tissue: Secondary | ICD-10-CM | POA: Diagnosis not present

## 2024-06-30 DIAGNOSIS — M069 Rheumatoid arthritis, unspecified: Secondary | ICD-10-CM | POA: Diagnosis not present

## 2024-07-03 DIAGNOSIS — M6281 Muscle weakness (generalized): Secondary | ICD-10-CM | POA: Diagnosis not present

## 2024-07-03 DIAGNOSIS — M069 Rheumatoid arthritis, unspecified: Secondary | ICD-10-CM | POA: Diagnosis not present

## 2024-07-03 DIAGNOSIS — I5030 Unspecified diastolic (congestive) heart failure: Secondary | ICD-10-CM | POA: Diagnosis not present

## 2024-07-03 DIAGNOSIS — K521 Toxic gastroenteritis and colitis: Secondary | ICD-10-CM | POA: Diagnosis not present

## 2024-07-03 DIAGNOSIS — R2689 Other abnormalities of gait and mobility: Secondary | ICD-10-CM | POA: Diagnosis not present

## 2024-07-03 DIAGNOSIS — J449 Chronic obstructive pulmonary disease, unspecified: Secondary | ICD-10-CM | POA: Diagnosis not present

## 2024-07-06 DIAGNOSIS — R748 Abnormal levels of other serum enzymes: Secondary | ICD-10-CM | POA: Diagnosis not present

## 2024-07-06 DIAGNOSIS — Z515 Encounter for palliative care: Secondary | ICD-10-CM | POA: Diagnosis not present

## 2024-07-06 DIAGNOSIS — N139 Obstructive and reflux uropathy, unspecified: Secondary | ICD-10-CM | POA: Diagnosis not present

## 2024-07-06 DIAGNOSIS — N390 Urinary tract infection, site not specified: Secondary | ICD-10-CM | POA: Diagnosis not present

## 2024-07-06 DIAGNOSIS — I509 Heart failure, unspecified: Secondary | ICD-10-CM | POA: Diagnosis not present

## 2024-07-06 DIAGNOSIS — C679 Malignant neoplasm of bladder, unspecified: Secondary | ICD-10-CM | POA: Diagnosis not present

## 2024-07-06 DIAGNOSIS — R55 Syncope and collapse: Secondary | ICD-10-CM | POA: Diagnosis not present

## 2024-07-06 DIAGNOSIS — K5641 Fecal impaction: Secondary | ICD-10-CM | POA: Diagnosis not present

## 2024-07-06 DIAGNOSIS — R339 Retention of urine, unspecified: Secondary | ICD-10-CM | POA: Diagnosis not present

## 2024-07-06 DIAGNOSIS — R197 Diarrhea, unspecified: Secondary | ICD-10-CM | POA: Diagnosis not present

## 2024-07-07 DIAGNOSIS — L988 Other specified disorders of the skin and subcutaneous tissue: Secondary | ICD-10-CM | POA: Diagnosis not present

## 2024-07-07 DIAGNOSIS — Z960 Presence of urogenital implants: Secondary | ICD-10-CM | POA: Diagnosis not present

## 2024-07-07 DIAGNOSIS — E860 Dehydration: Secondary | ICD-10-CM | POA: Diagnosis not present

## 2024-07-07 DIAGNOSIS — C3490 Malignant neoplasm of unspecified part of unspecified bronchus or lung: Secondary | ICD-10-CM | POA: Diagnosis not present

## 2024-07-07 DIAGNOSIS — R627 Adult failure to thrive: Secondary | ICD-10-CM | POA: Diagnosis not present

## 2024-07-07 DIAGNOSIS — N139 Obstructive and reflux uropathy, unspecified: Secondary | ICD-10-CM | POA: Diagnosis not present

## 2024-07-07 DIAGNOSIS — R339 Retention of urine, unspecified: Secondary | ICD-10-CM | POA: Diagnosis not present

## 2024-07-07 DIAGNOSIS — I509 Heart failure, unspecified: Secondary | ICD-10-CM | POA: Diagnosis not present

## 2024-07-07 DIAGNOSIS — T8189XA Other complications of procedures, not elsewhere classified, initial encounter: Secondary | ICD-10-CM | POA: Diagnosis not present

## 2024-07-07 DIAGNOSIS — Z515 Encounter for palliative care: Secondary | ICD-10-CM | POA: Diagnosis not present

## 2024-07-07 DIAGNOSIS — C679 Malignant neoplasm of bladder, unspecified: Secondary | ICD-10-CM | POA: Diagnosis not present

## 2024-07-08 ENCOUNTER — Telehealth: Payer: Self-pay | Admitting: Internal Medicine

## 2024-07-08 NOTE — Telephone Encounter (Signed)
 The patients daughter in law called to inform us  of the patient being put on Hospice care. She expressed just how grateful the patient and family are for taking such good care of her.

## 2024-07-13 ENCOUNTER — Other Ambulatory Visit: Payer: Self-pay

## 2024-07-13 ENCOUNTER — Encounter (INDEPENDENT_AMBULATORY_CARE_PROVIDER_SITE_OTHER): Payer: Self-pay

## 2024-07-13 NOTE — Progress Notes (Signed)
 Disenrolling - per 6.27.25 chart notes and patient refill questionnaire response Tarceva  stopped at this time

## 2024-07-14 ENCOUNTER — Other Ambulatory Visit (HOSPITAL_COMMUNITY)

## 2024-07-14 ENCOUNTER — Other Ambulatory Visit

## 2024-07-20 ENCOUNTER — Encounter: Admitting: Physical Medicine & Rehabilitation

## 2024-07-21 ENCOUNTER — Inpatient Hospital Stay: Attending: Internal Medicine | Admitting: Internal Medicine

## 2024-08-28 DIAGNOSIS — H04123 Dry eye syndrome of bilateral lacrimal glands: Secondary | ICD-10-CM | POA: Diagnosis not present

## 2024-09-15 ENCOUNTER — Encounter (HOSPITAL_COMMUNITY): Payer: Self-pay

## 2024-09-15 ENCOUNTER — Other Ambulatory Visit: Payer: Self-pay

## 2024-09-15 ENCOUNTER — Emergency Department (HOSPITAL_COMMUNITY)
Admission: EM | Admit: 2024-09-15 | Discharge: 2024-09-15 | Disposition: A | Discharge status: Home / Self Care | Attending: Emergency Medicine | Admitting: Emergency Medicine

## 2024-09-15 DIAGNOSIS — B9689 Other specified bacterial agents as the cause of diseases classified elsewhere: Secondary | ICD-10-CM | POA: Insufficient documentation

## 2024-09-15 DIAGNOSIS — Z85118 Personal history of other malignant neoplasm of bronchus and lung: Secondary | ICD-10-CM | POA: Insufficient documentation

## 2024-09-15 DIAGNOSIS — R404 Transient alteration of awareness: Secondary | ICD-10-CM | POA: Diagnosis not present

## 2024-09-15 DIAGNOSIS — Z7951 Long term (current) use of inhaled steroids: Secondary | ICD-10-CM | POA: Insufficient documentation

## 2024-09-15 DIAGNOSIS — J449 Chronic obstructive pulmonary disease, unspecified: Secondary | ICD-10-CM | POA: Insufficient documentation

## 2024-09-15 DIAGNOSIS — W19XXXA Unspecified fall, initial encounter: Secondary | ICD-10-CM

## 2024-09-15 DIAGNOSIS — Z8551 Personal history of malignant neoplasm of bladder: Secondary | ICD-10-CM | POA: Insufficient documentation

## 2024-09-15 DIAGNOSIS — E860 Dehydration: Secondary | ICD-10-CM | POA: Insufficient documentation

## 2024-09-15 DIAGNOSIS — R55 Syncope and collapse: Secondary | ICD-10-CM | POA: Insufficient documentation

## 2024-09-15 DIAGNOSIS — N390 Urinary tract infection, site not specified: Secondary | ICD-10-CM | POA: Diagnosis not present

## 2024-09-15 DIAGNOSIS — I499 Cardiac arrhythmia, unspecified: Secondary | ICD-10-CM | POA: Diagnosis not present

## 2024-09-15 LAB — URINALYSIS, W/ REFLEX TO CULTURE (INFECTION SUSPECTED)
Bilirubin Urine: NEGATIVE
Glucose, UA: NEGATIVE mg/dL
Hgb urine dipstick: NEGATIVE
Ketones, ur: NEGATIVE mg/dL
Nitrite: NEGATIVE
Protein, ur: NEGATIVE mg/dL
Specific Gravity, Urine: 1.009 (ref 1.005–1.030)
pH: 6 (ref 5.0–8.0)

## 2024-09-15 MED ORDER — LACTATED RINGERS IV BOLUS
500.0000 mL | Freq: Once | INTRAVENOUS | Status: AC
  Administered 2024-09-15: 500 mL via INTRAVENOUS

## 2024-09-15 MED ORDER — CEPHALEXIN 250 MG/5ML PO SUSR: 500.0000 mg | Freq: Three times a day (TID) | ORAL | 0 refills | Status: AC

## 2024-09-15 NOTE — ED Notes (Signed)
 Pt ambulated to hall  and back without assistance. Pt had no complaints. Pt used a walker with wheeler, gait was slow and steady.

## 2024-09-15 NOTE — ED Provider Notes (Signed)
 Dana [name] EMERGENCY DEPARTMENT AT [facility] Provider Note   CSN: [id number] Arrival date & time: 09/15/24  0930     History Chief Complaint  Patient presents with   Fall   Loss of Consciousness    HPI: Dana [name] is a 89 y.o. female with history pertinent for hospice enrollment with [facility] hospice, COPD lung cancer, bladder cancer who presents complaining of fall. Patient arrived via EMS from home.  History provided by patient and EMS.  No interpreter required during this encounter.  Patient reportedly got up overnight to use the bathroom around midnight, and felt dizzy and fell to the ground.  She was unable to get up on her own, therefore was down overnight until her husband and caretaker found her this morning.  When the caretaker tried to stand her up she reportedly had loss of consciousness.  EMS reports that on their arrival patient had blood pressures with systolic of 70s.  They placed a PIV and gave patient 300 cc of fluid with symptomatic improvement as well as improvement in systolics to 120s.  Patient reports that she is overall feeling general malaise and fatigue prior to fluids, however is now feeling much better.  Reports that she is currently enrolled in hospice due to terminal malignancy diagnosis.  Reports that she previously had a Foley catheter, however found it uncomfortable and found that it was disruptive to her ability to get out and about due to the small bag size, therefore had it removed.  Reports that she was having difficulty with having to get up multiple times overnight to urinate, therefore she reports that she was advised by her hospice provider to have less intake of fluids, however notes that since doing so she has had lightheadedness upon standing and more frequent falls.  EMS notes that per family patient had 1 week of foul-smelling urine, patient denies any dysuria, urgency, frequency.  Patient's recorded medical, surgical, social,  medication list and allergies were reviewed in the Snapshot window as part of the initial history.   Prior to Admission medications   Medication Sig Start Date End Date Taking? Authorizing Provider  cephALEXin  (KEFLEX ) 250 MG/5ML suspension Take 10 mLs (500 mg total) by mouth 3 (three) times daily for 7 days. 09/15/24 09/22/24 Yes Rogelia Jerilynn RAMAN, MD  acetaminophen  (TYLENOL ) 650 MG CR tablet Take 650 mg by mouth in the morning.    [provider]  AMBULATORY NON FORMULARY MEDICATION R knee brace Dispense 1 Dx code: M17.11 Use as needed 09/30/23   Corey, Evan S, MD  BIOTIN PO Take 1 capsule by mouth daily.    [provider]  bismuth  subsalicylate (PEPTO BISMOL) 262 MG chewable tablet Chew 262 mg by mouth as needed for indigestion or diarrhea or loose stools.    [provider]  Calcium  Carbonate-Vit D-Min (CALCIUM  600+D PLUS MINERALS) 600-400 MG-UNIT TABS Take 1 tablet by mouth 2 (two) times daily.    [provider]  Cyanocobalamin  (VITAMIN B-12 PO) Take 1 tablet by mouth daily.    [provider]  erlotinib  (TARCEVA ) 100 MG tablet Take 1 tablet (100 mg total) by mouth daily. Patient taking differently: Take 100 mg by mouth daily at 4 PM. 03/11/24   Sherrod Sherrod, MD  loratadine  (CLARITIN ) 10 MG tablet Take 10 mg by mouth daily as needed (for seasonal allergies).    [provider]  mirtazapine  (REMERON ) 15 MG tablet TAKE 1 TABLET BY MOUTH AT BEDTIME 05/16/24   Heilingoetter,  Cassandra L, PA-C  Multiple Vitamins-Minerals (PRESERVISION AREDS 2 PO) Take 1 capsule by mouth in the morning and at bedtime.    [provider]  Polyethyl Glycol-Propyl Glycol (SYSTANE OP) Place 1 drop into both eyes in the morning and at bedtime.    [provider]  polyethylene glycol (MIRALAX ) 17 g packet Take 17 g by mouth daily. Patient taking differently: Take 17 g by mouth daily as needed for mild constipation (HOLD FOR DIARRHEA). 06/02/24    Shane Steffan BROCKS, MD  Probiotic Product (PROBIOTIC ACIDOPHILUS) CHEW Chew 1 tablet by mouth in the morning.    [provider]  Tiotropium Bromide -Olodaterol (STIOLTO RESPIMAT ) 2.5-2.5 MCG/ACT AERS INHALE 2 PUFFS BY MOUTH ONCE DAILY 07/10/22   Wert, Michael B, MD  XYLITOL MT 1 tablet See admin instructions. Place 1 melt tablet in the gumline at bedtime as directed    [provider]     Allergies: Methotrexate, Clindamycin  hcl, Lactose intolerance (gi), Amoxicillin, and Protonix  [pantoprazole  sodium]   Review of Systems   ROS as per HPI  Physical Exam Updated Vital Signs BP (!) 141/81 (BP Location: Left Arm)   Pulse (!) 106   Temp 97.8 F (36.6 C) (Oral)   Resp 18   SpO2 100%  Physical Exam Vitals and nursing note reviewed.  Constitutional:      General: She is not in acute distress.    Comments: Elderly and frail-appearing  HENT:     Head: Normocephalic and atraumatic.  Eyes:     Extraocular Movements: Extraocular movements intact.  Cardiovascular:     Rate and Rhythm: Normal rate.     Pulses: Normal pulses.  Pulmonary:     Effort: Pulmonary effort is normal.  Skin:    General: Skin is warm and dry.     Capillary Refill: Capillary refill takes less than 2 seconds.  Neurological:     General: No focal deficit present.     Mental Status: She is alert and oriented to person, place, and time.     ED Course/ Medical Decision Making/ A&P    Procedures Procedures   Medications Ordered in ED Medications  lactated ringers  bolus 500 mL (0 mLs Intravenous Stopped 09/15/24 1238)    Medical Decision Making:   Dana [name] is a 89 y.o. female who presents for fall as per above.  Physical exam is pertinent for elderly and frail appearance.   The differential includes but is not limited to dehydration, AKI, rhabdomyolysis, UTI, metabolic derangement, palliative goals of care.  Independent historian: EMS  External data reviewed: No pertinent external  data  Labs: Ordered, Independent interpretation, and Details: UA with positive LE, present bacteria, WBCs, concerning for UTI  Radiology: Not indicated No results found.  EKG/Medicine tests: Not indicated EKG Interpretation:    See the EMR for full details regarding lab and imaging results.  Discussed with patient extensively at bedside her goals of care.  Patient would like to limit labs and imaging as she is not seeking curative treatment, and she would not be interested in inpatient admission at this time should abnormalities be found on labs or imaging.  Patient reports that she did feel much better after receiving fluids en route, given patient already has IV access established, will administer additional 500 cc LR bolus.  Offered palliative Foley catheter insertion such that patient would not have to get up overnight to use the bathroom.  Patient states that she will think about it, and will discuss it  with her caregiver prior to deciding.  Discussed that after fluids we will trial ambulation to ensure that patient is able to ambulate at bedside, and if she is not, we would consider imaging such as x-rays or possible CT to evaluate for underlying hip fracture that may change her ability to be independent at home at her baseline level.  After fluids, patient has further symptomatic improvement, able to ambulate without difficulty, therefore will not perform additional imaging or blood work.  Patient does request UA, as she states that she would be amenable to oral antibiotics.  This was obtained and suspicious for UTI, therefore we will treat with course of Keflex .  Discussed this with patient at bedside, as well as daughter-in-law over the phone, daughter-in-law states that patient has difficulty swallowing pills, requested oral suspension, therefore patient prescribed oral suspension of Keflex .  No additional acute events while patient was under my care, discharged.  Presentation is most  consistent with acute complicated illness and Current presentation is complicated by underlying chronic conditions  Discussion of management or test interpretations with external provider(s): Not indicated  Risk Drugs:Prescription drug management Treatment: Decision not to resuscitate or to de-escalate care because of poor prognosis  Disposition: DISCHARGE: I believe that the patient is safe for discharge home with outpatient follow-up. Patient was informed of all pertinent physical exam, laboratory, and imaging findings. Patient's suspected etiology of their symptom presentation was discussed with the patient and all questions were answered. We discussed following up with home hospice provider. I provided thorough ED return precautions. The patient feels safe and comfortable with this plan.  MDM generated using voice dictation software and may contain dictation errors.  Please contact me for any clarification or with any questions.  Clinical Impression:  1. Dehydration   2. Fall, initial encounter   3. Syncope and collapse   4. Lower urinary tract infectious disease      Discharge   Final Clinical Impression(s) / ED Diagnoses Final diagnoses:  Dehydration  Fall, initial encounter  Syncope and collapse  Lower urinary tract infectious disease    Rx / DC Orders ED Discharge Orders          Ordered    cephALEXin  (KEFLEX ) 250 MG/5ML suspension  3 times daily        09/15/24 1550             Rogelia Jerilynn RAMAN, MD 09/18/24 [phone]

## 2024-09-15 NOTE — ED Notes (Signed)
Changed patients brief 

## 2024-09-15 NOTE — Discharge Instructions (Signed)
 Devere VEAR Hasten  Thank you for allowing us  to take care of you today.  You came to the Emergency Department today because you have not been drinking as much at night, and she had a fall last night, and she had had some pelvis, urine for the past week.  Here in the emergency department you already had a IV placed by the ambulance before you got here, you felt better with fluids, therefore we gave you additional fluids, and you felt better.  You did not want a urine test, therefore we did a urine testing does look like you have a UTI.  We did not do any additional imaging or blood work because your goals of care do not align with having further invasive workup at this time.  We are prescribing you on a antibiotic called Keflex  that you should take 3 times a day for the next 7 days.  You can follow-up with your hospice provider for reevaluation and the remainder of your care.  To-Do: 1. Please follow-up with your hospice doctor within 2 days / as soon as possible.   Please return to the Emergency Department or call 911 if you experience have worsening of your symptoms, or do not get better, chest pain, shortness of breath, severe or significantly worsening pain, high fever, severe confusion, pass out or have any reason to think that you need emergency medical care.   We hope you feel better soon.   Mitzie Later, MD Department of Emergency Medicine [facility]

## 2024-09-15 NOTE — ED Triage Notes (Signed)
 Pt BIB EMS from Home due to fall last night on floor, unknown down time. Unclear if pt hit head, no visible injuries. Pt was found on floor, when getting pt up pt had a syncopal episode for about 5 mins, pt pale with BP systolic in the 70s. CBG 111. Pt reports not drinking much water . Pt is on Hospice.  22ga IV 300 mL NaCl given.

## 2024-09-16 DIAGNOSIS — E860 Dehydration: Secondary | ICD-10-CM | POA: Diagnosis not present

## 2024-09-17 LAB — URINE CULTURE: Culture: >=100000 — AB

## 2024-09-18 ENCOUNTER — Telehealth (HOSPITAL_BASED_OUTPATIENT_CLINIC_OR_DEPARTMENT_OTHER): Payer: Self-pay | Admitting: *Deleted

## 2024-09-18 NOTE — Telephone Encounter (Signed)
 Post ED Visit - Positive Culture Follow-up  Culture report reviewed by antimicrobial stewardship pharmacist: [facility] []  Venetia Gully, Pharm.D., BCPS AQ-ID []  Garrel Crews, Pharm.D., BCPS []  Almarie Lunger, [address].D., BCPS []  [place], [place].D., BCPS, AAHIVP []  Rosaline Bihari, Pharm.D., BCPS, AAHIVP []  Vernell Meier, PharmD, BCPS []  Latanya Hint, PharmD, BCPS []  Donald Medley, PharmD, BCPS []  Rocky Bold, PharmD []  Dorothyann Alert, PharmD, BCPS []  Morene Babe, PharmD  [facility] [x]  Dionicia Canavan, PharmD []  Romona Bliss, PharmD []  Dolphus Roller, PharmD []  Veva Seip, Rph []  Vernell Daunt) Leonce, PharmD []  Eva Allis, PharmD []  Rosaline Millet, PharmD []  Iantha Batch, PharmD []  Arvin Gauss, PharmD []  Wanda Hasting, PharmD []  Ronal Rav, PharmD []  Rocky Slade, PharmD []  Bard Jeans, PharmD   Positive urine culture Treated with cephalexin , organism sensitive to the same and no further patient follow-up is required at this time.  Lorita Barnie Pereyra 09/18/2024, 9:17 AM

## 2024-10-08 DIAGNOSIS — C44622 Squamous cell carcinoma of skin of right upper limb, including shoulder: Secondary | ICD-10-CM | POA: Diagnosis not present

## 2025-01-26 ENCOUNTER — Other Ambulatory Visit: Payer: Self-pay
